# Patient Record
Sex: Female | Born: 1955 | Race: White | Hispanic: No | Marital: Married | Smoking: Current every day smoker
Health system: Southern US, Community
[De-identification: ages and names within clinical notes are randomized; demographics above are authoritative.]

## PROBLEM LIST (undated history)

## (undated) DIAGNOSIS — S060XAA Concussion with loss of consciousness status unknown, initial encounter: Secondary | ICD-10-CM

## (undated) DIAGNOSIS — I10 Essential (primary) hypertension: Secondary | ICD-10-CM

## (undated) DIAGNOSIS — R197 Diarrhea, unspecified: Secondary | ICD-10-CM

## (undated) DIAGNOSIS — M810 Age-related osteoporosis without current pathological fracture: Secondary | ICD-10-CM

## (undated) DIAGNOSIS — F99 Mental disorder, not otherwise specified: Secondary | ICD-10-CM

## (undated) DIAGNOSIS — K529 Noninfective gastroenteritis and colitis, unspecified: Secondary | ICD-10-CM

## (undated) DIAGNOSIS — K219 Gastro-esophageal reflux disease without esophagitis: Secondary | ICD-10-CM

## (undated) DIAGNOSIS — R748 Abnormal levels of other serum enzymes: Secondary | ICD-10-CM

## (undated) DIAGNOSIS — E785 Hyperlipidemia, unspecified: Secondary | ICD-10-CM

## (undated) DIAGNOSIS — G5600 Carpal tunnel syndrome, unspecified upper limb: Secondary | ICD-10-CM

## (undated) DIAGNOSIS — M1990 Unspecified osteoarthritis, unspecified site: Secondary | ICD-10-CM

## (undated) DIAGNOSIS — S060X9A Concussion with loss of consciousness of unspecified duration, initial encounter: Secondary | ICD-10-CM

## (undated) DIAGNOSIS — J4489 Other specified chronic obstructive pulmonary disease: Secondary | ICD-10-CM

## (undated) DIAGNOSIS — R112 Nausea with vomiting, unspecified: Secondary | ICD-10-CM

## (undated) DIAGNOSIS — F419 Anxiety disorder, unspecified: Secondary | ICD-10-CM

## (undated) DIAGNOSIS — M25519 Pain in unspecified shoulder: Secondary | ICD-10-CM

## (undated) DIAGNOSIS — K649 Unspecified hemorrhoids: Secondary | ICD-10-CM

## (undated) DIAGNOSIS — R0602 Shortness of breath: Secondary | ICD-10-CM

## (undated) DIAGNOSIS — Z9889 Other specified postprocedural states: Secondary | ICD-10-CM

## (undated) DIAGNOSIS — G4730 Sleep apnea, unspecified: Secondary | ICD-10-CM

## (undated) DIAGNOSIS — IMO0002 Reserved for concepts with insufficient information to code with codable children: Secondary | ICD-10-CM

## (undated) DIAGNOSIS — M545 Low back pain, unspecified: Secondary | ICD-10-CM

## (undated) DIAGNOSIS — K222 Esophageal obstruction: Secondary | ICD-10-CM

## (undated) DIAGNOSIS — K289 Gastrojejunal ulcer, unspecified as acute or chronic, without hemorrhage or perforation: Secondary | ICD-10-CM

## (undated) DIAGNOSIS — K5090 Crohn's disease, unspecified, without complications: Secondary | ICD-10-CM

## (undated) DIAGNOSIS — J449 Chronic obstructive pulmonary disease, unspecified: Secondary | ICD-10-CM

## (undated) HISTORY — DX: Age-related osteoporosis without current pathological fracture: M81.0

## (undated) HISTORY — DX: Low back pain, unspecified: M54.50

## (undated) HISTORY — DX: Other specified chronic obstructive pulmonary disease: J44.89

## (undated) HISTORY — DX: Carpal tunnel syndrome, unspecified upper limb: G56.00

## (undated) HISTORY — PX: LUMBAR DISC SURGERY: SHX700

## (undated) HISTORY — PX: COLON SURGERY: SHX602

## (undated) HISTORY — DX: Chronic obstructive pulmonary disease, unspecified: J44.9

## (undated) HISTORY — DX: Anxiety disorder, unspecified: F41.9

## (undated) HISTORY — DX: Esophageal obstruction: K22.2

## (undated) HISTORY — DX: Mental disorder, not otherwise specified: F99

## (undated) HISTORY — DX: Diarrhea, unspecified: R19.7

## (undated) HISTORY — DX: Noninfective gastroenteritis and colitis, unspecified: K52.9

## (undated) HISTORY — DX: Hyperlipidemia, unspecified: E78.5

## (undated) HISTORY — DX: Pain in unspecified shoulder: M25.519

## (undated) HISTORY — PX: BILATERAL SALPINGOOPHORECTOMY: SHX1223

## (undated) HISTORY — DX: Abnormal levels of other serum enzymes: R74.8

## (undated) HISTORY — PX: HERNIA REPAIR: SHX51

## (undated) HISTORY — DX: Reserved for concepts with insufficient information to code with codable children: IMO0002

## (undated) HISTORY — DX: Unspecified hemorrhoids: K64.9

## (undated) HISTORY — DX: Crohn's disease, unspecified, without complications: K50.90

## (undated) HISTORY — PX: CARPAL TUNNEL RELEASE: SHX101

## (undated) HISTORY — DX: Low back pain: M54.5

## (undated) HISTORY — DX: Gastrojejunal ulcer, unspecified as acute or chronic, without hemorrhage or perforation: K28.9

## (undated) HISTORY — PX: COLONOSCOPY: SHX174

## (undated) HISTORY — PX: OTHER SURGICAL HISTORY: SHX169

## (undated) HISTORY — PX: UPPER GASTROINTESTINAL ENDOSCOPY: SHX188

## (undated) HISTORY — DX: Concussion with loss of consciousness status unknown, initial encounter: S06.0XAA

## (undated) HISTORY — DX: Concussion with loss of consciousness of unspecified duration, initial encounter: S06.0X9A

## (undated) HISTORY — PX: HEMICOLECTOMY: SHX854

## (undated) HISTORY — PX: EYE SURGERY: SHX253

## (undated) HISTORY — PX: DILATION AND CURETTAGE OF UTERUS: SHX78

## (undated) HISTORY — PX: BACK SURGERY: SHX140

---

## 2004-01-23 ENCOUNTER — Ambulatory Visit (HOSPITAL_COMMUNITY): Admission: RE | Admit: 2004-01-23 | Discharge: 2004-01-23 | Payer: Self-pay | Admitting: Emergency Medicine

## 2004-06-22 ENCOUNTER — Ambulatory Visit (HOSPITAL_COMMUNITY): Admission: RE | Admit: 2004-06-22 | Discharge: 2004-06-22 | Payer: Self-pay | Admitting: Emergency Medicine

## 2004-07-19 ENCOUNTER — Ambulatory Visit (HOSPITAL_COMMUNITY): Admission: RE | Admit: 2004-07-19 | Discharge: 2004-07-19 | Payer: Self-pay | Admitting: Emergency Medicine

## 2004-07-21 ENCOUNTER — Inpatient Hospital Stay (HOSPITAL_COMMUNITY): Admission: EM | Admit: 2004-07-21 | Discharge: 2004-08-02 | Payer: Self-pay | Admitting: Emergency Medicine

## 2004-09-05 ENCOUNTER — Ambulatory Visit (HOSPITAL_COMMUNITY): Admission: RE | Admit: 2004-09-05 | Discharge: 2004-09-05 | Payer: Self-pay | Admitting: General Surgery

## 2005-05-21 ENCOUNTER — Inpatient Hospital Stay (HOSPITAL_COMMUNITY): Admission: RE | Admit: 2005-05-21 | Discharge: 2005-05-23 | Payer: Self-pay | Admitting: General Surgery

## 2006-01-10 ENCOUNTER — Ambulatory Visit: Payer: Self-pay | Admitting: Family Medicine

## 2006-01-16 ENCOUNTER — Ambulatory Visit (HOSPITAL_COMMUNITY): Admission: RE | Admit: 2006-01-16 | Discharge: 2006-01-16 | Payer: Self-pay | Admitting: Family Medicine

## 2006-01-16 ENCOUNTER — Encounter (INDEPENDENT_AMBULATORY_CARE_PROVIDER_SITE_OTHER): Payer: Self-pay | Admitting: Family Medicine

## 2006-01-16 LAB — CONVERTED CEMR LAB: TSH: 1.045 microintl units/mL

## 2006-01-20 ENCOUNTER — Ambulatory Visit: Payer: Self-pay | Admitting: Family Medicine

## 2006-01-31 ENCOUNTER — Ambulatory Visit: Payer: Self-pay | Admitting: Family Medicine

## 2006-02-03 ENCOUNTER — Ambulatory Visit: Payer: Self-pay | Admitting: Family Medicine

## 2006-02-12 ENCOUNTER — Ambulatory Visit: Payer: Self-pay | Admitting: Urgent Care

## 2006-02-17 ENCOUNTER — Ambulatory Visit (HOSPITAL_COMMUNITY): Admission: RE | Admit: 2006-02-17 | Discharge: 2006-02-17 | Payer: Self-pay | Admitting: Gastroenterology

## 2006-02-18 ENCOUNTER — Ambulatory Visit: Payer: Self-pay | Admitting: Family Medicine

## 2006-02-24 ENCOUNTER — Ambulatory Visit (HOSPITAL_COMMUNITY): Admission: RE | Admit: 2006-02-24 | Discharge: 2006-02-24 | Payer: Self-pay | Admitting: Family Medicine

## 2006-02-27 ENCOUNTER — Encounter (INDEPENDENT_AMBULATORY_CARE_PROVIDER_SITE_OTHER): Payer: Self-pay | Admitting: Family Medicine

## 2006-02-27 DIAGNOSIS — J309 Allergic rhinitis, unspecified: Secondary | ICD-10-CM | POA: Insufficient documentation

## 2006-02-27 DIAGNOSIS — K219 Gastro-esophageal reflux disease without esophagitis: Secondary | ICD-10-CM | POA: Insufficient documentation

## 2006-02-27 DIAGNOSIS — K50019 Crohn's disease of small intestine with unspecified complications: Secondary | ICD-10-CM

## 2006-02-27 DIAGNOSIS — I10 Essential (primary) hypertension: Secondary | ICD-10-CM | POA: Insufficient documentation

## 2006-02-27 DIAGNOSIS — F411 Generalized anxiety disorder: Secondary | ICD-10-CM

## 2006-03-07 ENCOUNTER — Ambulatory Visit: Payer: Self-pay | Admitting: Gastroenterology

## 2006-04-10 ENCOUNTER — Ambulatory Visit: Payer: Self-pay | Admitting: Family Medicine

## 2006-04-24 ENCOUNTER — Ambulatory Visit: Payer: Self-pay | Admitting: Family Medicine

## 2006-05-26 ENCOUNTER — Ambulatory Visit: Payer: Self-pay | Admitting: Family Medicine

## 2006-06-24 ENCOUNTER — Ambulatory Visit: Payer: Self-pay | Admitting: Family Medicine

## 2006-07-14 ENCOUNTER — Encounter (INDEPENDENT_AMBULATORY_CARE_PROVIDER_SITE_OTHER): Payer: Self-pay | Admitting: Family Medicine

## 2006-07-14 ENCOUNTER — Ambulatory Visit: Payer: Self-pay | Admitting: Gastroenterology

## 2006-07-21 ENCOUNTER — Telehealth (INDEPENDENT_AMBULATORY_CARE_PROVIDER_SITE_OTHER): Payer: Self-pay | Admitting: Family Medicine

## 2006-07-23 ENCOUNTER — Encounter (INDEPENDENT_AMBULATORY_CARE_PROVIDER_SITE_OTHER): Payer: Self-pay | Admitting: Family Medicine

## 2006-08-13 ENCOUNTER — Encounter (INDEPENDENT_AMBULATORY_CARE_PROVIDER_SITE_OTHER): Payer: Self-pay | Admitting: Family Medicine

## 2006-08-22 ENCOUNTER — Telehealth (INDEPENDENT_AMBULATORY_CARE_PROVIDER_SITE_OTHER): Payer: Self-pay | Admitting: Family Medicine

## 2006-08-29 ENCOUNTER — Encounter (INDEPENDENT_AMBULATORY_CARE_PROVIDER_SITE_OTHER): Payer: Self-pay | Admitting: Family Medicine

## 2006-09-01 ENCOUNTER — Other Ambulatory Visit: Admission: RE | Admit: 2006-09-01 | Discharge: 2006-09-01 | Payer: Self-pay | Admitting: Family Medicine

## 2006-09-01 ENCOUNTER — Encounter (INDEPENDENT_AMBULATORY_CARE_PROVIDER_SITE_OTHER): Payer: Self-pay | Admitting: Family Medicine

## 2006-09-01 ENCOUNTER — Ambulatory Visit: Payer: Self-pay | Admitting: Family Medicine

## 2006-09-01 LAB — CONVERTED CEMR LAB: Pap Smear: NORMAL

## 2006-09-04 ENCOUNTER — Telehealth (INDEPENDENT_AMBULATORY_CARE_PROVIDER_SITE_OTHER): Payer: Self-pay | Admitting: Family Medicine

## 2006-09-09 ENCOUNTER — Ambulatory Visit: Payer: Self-pay | Admitting: Family Medicine

## 2006-09-22 ENCOUNTER — Encounter (INDEPENDENT_AMBULATORY_CARE_PROVIDER_SITE_OTHER): Payer: Self-pay | Admitting: Family Medicine

## 2006-09-25 ENCOUNTER — Ambulatory Visit (HOSPITAL_COMMUNITY): Admission: RE | Admit: 2006-09-25 | Discharge: 2006-09-25 | Payer: Self-pay | Admitting: Family Medicine

## 2006-09-30 ENCOUNTER — Ambulatory Visit: Payer: Self-pay | Admitting: Family Medicine

## 2006-09-30 DIAGNOSIS — I868 Varicose veins of other specified sites: Secondary | ICD-10-CM

## 2006-10-02 ENCOUNTER — Encounter: Admission: RE | Admit: 2006-10-02 | Discharge: 2006-10-02 | Payer: Self-pay | Admitting: Family Medicine

## 2006-11-06 ENCOUNTER — Encounter (INDEPENDENT_AMBULATORY_CARE_PROVIDER_SITE_OTHER): Payer: Self-pay | Admitting: Family Medicine

## 2006-11-12 ENCOUNTER — Encounter (INDEPENDENT_AMBULATORY_CARE_PROVIDER_SITE_OTHER): Payer: Self-pay | Admitting: Family Medicine

## 2006-12-02 ENCOUNTER — Ambulatory Visit: Payer: Self-pay | Admitting: Family Medicine

## 2006-12-04 ENCOUNTER — Encounter (INDEPENDENT_AMBULATORY_CARE_PROVIDER_SITE_OTHER): Payer: Self-pay | Admitting: Family Medicine

## 2007-01-12 ENCOUNTER — Encounter (INDEPENDENT_AMBULATORY_CARE_PROVIDER_SITE_OTHER): Payer: Self-pay | Admitting: Family Medicine

## 2007-01-29 ENCOUNTER — Encounter (INDEPENDENT_AMBULATORY_CARE_PROVIDER_SITE_OTHER): Payer: Self-pay | Admitting: Family Medicine

## 2007-01-29 ENCOUNTER — Ambulatory Visit: Payer: Self-pay | Admitting: Gastroenterology

## 2007-02-03 ENCOUNTER — Ambulatory Visit: Payer: Self-pay | Admitting: Family Medicine

## 2007-02-03 DIAGNOSIS — E785 Hyperlipidemia, unspecified: Secondary | ICD-10-CM | POA: Insufficient documentation

## 2007-02-03 LAB — CONVERTED CEMR LAB
HDL goal, serum: 40 mg/dL
LDL Goal: 130 mg/dL

## 2007-02-04 LAB — CONVERTED CEMR LAB
Barbiturate Quant, Ur: NEGATIVE
Benzodiazepines.: POSITIVE — AB
Cocaine Metabolites: NEGATIVE
Creatinine,U: 62.9 mg/dL
Methadone: NEGATIVE

## 2007-02-06 ENCOUNTER — Encounter (INDEPENDENT_AMBULATORY_CARE_PROVIDER_SITE_OTHER): Payer: Self-pay | Admitting: Family Medicine

## 2007-02-06 ENCOUNTER — Ambulatory Visit (HOSPITAL_COMMUNITY): Admission: RE | Admit: 2007-02-06 | Discharge: 2007-02-06 | Payer: Self-pay | Admitting: Family Medicine

## 2007-02-09 LAB — CONVERTED CEMR LAB
Albumin: 4.4 g/dL (ref 3.5–5.2)
CO2: 23 meq/L (ref 19–32)
Calcium: 9.3 mg/dL (ref 8.4–10.5)
Chloride: 102 meq/L (ref 96–112)
Cholesterol: 222 mg/dL — ABNORMAL HIGH (ref 0–200)
Glucose, Bld: 78 mg/dL (ref 70–99)
LDL Cholesterol: 57 mg/dL (ref 0–99)
Lymphocytes Relative: 36 % (ref 12–46)
Lymphs Abs: 1.6 10*3/uL (ref 0.7–3.3)
Monocytes Relative: 13 % — ABNORMAL HIGH (ref 3–11)
Neutrophils Relative %: 49 % (ref 43–77)
Platelets: 278 10*3/uL (ref 150–400)
Potassium: 4 meq/L (ref 3.5–5.3)
RBC: 4.49 M/uL (ref 3.87–5.11)
Sodium: 139 meq/L (ref 135–145)
Total Protein: 6.7 g/dL (ref 6.0–8.3)
Triglycerides: 85 mg/dL (ref <150)
WBC: 4.6 10*3/uL (ref 4.0–10.5)

## 2007-03-03 ENCOUNTER — Encounter (INDEPENDENT_AMBULATORY_CARE_PROVIDER_SITE_OTHER): Payer: Self-pay | Admitting: Family Medicine

## 2007-03-06 ENCOUNTER — Ambulatory Visit: Payer: Self-pay | Admitting: Gastroenterology

## 2007-03-06 ENCOUNTER — Ambulatory Visit (HOSPITAL_COMMUNITY): Admission: RE | Admit: 2007-03-06 | Discharge: 2007-03-06 | Payer: Self-pay | Admitting: Gastroenterology

## 2007-03-10 ENCOUNTER — Encounter (INDEPENDENT_AMBULATORY_CARE_PROVIDER_SITE_OTHER): Payer: Self-pay | Admitting: Family Medicine

## 2007-04-28 ENCOUNTER — Encounter (INDEPENDENT_AMBULATORY_CARE_PROVIDER_SITE_OTHER): Payer: Self-pay | Admitting: Family Medicine

## 2007-05-04 ENCOUNTER — Ambulatory Visit: Payer: Self-pay | Admitting: Family Medicine

## 2007-05-07 DIAGNOSIS — K289 Gastrojejunal ulcer, unspecified as acute or chronic, without hemorrhage or perforation: Secondary | ICD-10-CM

## 2007-05-07 DIAGNOSIS — K222 Esophageal obstruction: Secondary | ICD-10-CM

## 2007-05-07 HISTORY — DX: Gastrojejunal ulcer, unspecified as acute or chronic, without hemorrhage or perforation: K28.9

## 2007-05-07 HISTORY — DX: Esophageal obstruction: K22.2

## 2007-05-12 ENCOUNTER — Ambulatory Visit: Payer: Self-pay | Admitting: Gastroenterology

## 2007-05-15 ENCOUNTER — Ambulatory Visit: Payer: Self-pay | Admitting: Gastroenterology

## 2007-05-15 ENCOUNTER — Ambulatory Visit (HOSPITAL_COMMUNITY): Admission: RE | Admit: 2007-05-15 | Discharge: 2007-05-15 | Payer: Self-pay | Admitting: Gastroenterology

## 2007-05-15 ENCOUNTER — Encounter: Payer: Self-pay | Admitting: Gastroenterology

## 2007-05-28 ENCOUNTER — Encounter (INDEPENDENT_AMBULATORY_CARE_PROVIDER_SITE_OTHER): Payer: Self-pay | Admitting: Family Medicine

## 2007-06-09 ENCOUNTER — Ambulatory Visit: Payer: Self-pay | Admitting: Family Medicine

## 2007-06-24 ENCOUNTER — Ambulatory Visit: Payer: Self-pay | Admitting: Gastroenterology

## 2007-06-24 ENCOUNTER — Encounter (INDEPENDENT_AMBULATORY_CARE_PROVIDER_SITE_OTHER): Payer: Self-pay | Admitting: Family Medicine

## 2007-06-29 ENCOUNTER — Ambulatory Visit: Payer: Self-pay | Admitting: Family Medicine

## 2007-06-29 DIAGNOSIS — M5030 Other cervical disc degeneration, unspecified cervical region: Secondary | ICD-10-CM | POA: Insufficient documentation

## 2007-07-08 ENCOUNTER — Ambulatory Visit: Payer: Self-pay | Admitting: Family Medicine

## 2007-07-08 ENCOUNTER — Telehealth (INDEPENDENT_AMBULATORY_CARE_PROVIDER_SITE_OTHER): Payer: Self-pay | Admitting: *Deleted

## 2007-07-09 ENCOUNTER — Ambulatory Visit (HOSPITAL_COMMUNITY): Admission: RE | Admit: 2007-07-09 | Discharge: 2007-07-09 | Payer: Self-pay | Admitting: Family Medicine

## 2007-07-13 ENCOUNTER — Telehealth (INDEPENDENT_AMBULATORY_CARE_PROVIDER_SITE_OTHER): Payer: Self-pay | Admitting: *Deleted

## 2007-07-23 ENCOUNTER — Encounter (INDEPENDENT_AMBULATORY_CARE_PROVIDER_SITE_OTHER): Payer: Self-pay | Admitting: Family Medicine

## 2007-08-05 ENCOUNTER — Ambulatory Visit: Payer: Self-pay | Admitting: Family Medicine

## 2007-08-05 ENCOUNTER — Telehealth (INDEPENDENT_AMBULATORY_CARE_PROVIDER_SITE_OTHER): Payer: Self-pay | Admitting: *Deleted

## 2007-08-18 ENCOUNTER — Encounter (INDEPENDENT_AMBULATORY_CARE_PROVIDER_SITE_OTHER): Payer: Self-pay | Admitting: Family Medicine

## 2007-08-18 ENCOUNTER — Encounter (HOSPITAL_COMMUNITY): Admission: RE | Admit: 2007-08-18 | Discharge: 2007-09-17 | Payer: Self-pay | Admitting: Family Medicine

## 2007-08-24 ENCOUNTER — Telehealth (INDEPENDENT_AMBULATORY_CARE_PROVIDER_SITE_OTHER): Payer: Self-pay | Admitting: *Deleted

## 2007-08-24 ENCOUNTER — Ambulatory Visit: Payer: Self-pay | Admitting: Family Medicine

## 2007-09-05 ENCOUNTER — Encounter (INDEPENDENT_AMBULATORY_CARE_PROVIDER_SITE_OTHER): Payer: Self-pay | Admitting: Family Medicine

## 2007-09-10 ENCOUNTER — Ambulatory Visit: Payer: Self-pay | Admitting: Gastroenterology

## 2007-09-10 ENCOUNTER — Encounter (INDEPENDENT_AMBULATORY_CARE_PROVIDER_SITE_OTHER): Payer: Self-pay | Admitting: Family Medicine

## 2007-09-21 ENCOUNTER — Encounter (INDEPENDENT_AMBULATORY_CARE_PROVIDER_SITE_OTHER): Payer: Self-pay | Admitting: Family Medicine

## 2007-09-22 ENCOUNTER — Encounter (HOSPITAL_COMMUNITY): Admission: RE | Admit: 2007-09-22 | Discharge: 2007-10-22 | Payer: Self-pay | Admitting: Family Medicine

## 2007-09-29 ENCOUNTER — Ambulatory Visit: Payer: Self-pay | Admitting: Family Medicine

## 2007-09-29 DIAGNOSIS — B009 Herpesviral infection, unspecified: Secondary | ICD-10-CM

## 2007-09-30 ENCOUNTER — Encounter (INDEPENDENT_AMBULATORY_CARE_PROVIDER_SITE_OTHER): Payer: Self-pay | Admitting: Family Medicine

## 2007-10-06 ENCOUNTER — Ambulatory Visit (HOSPITAL_COMMUNITY): Admission: RE | Admit: 2007-10-06 | Discharge: 2007-10-06 | Payer: Self-pay | Admitting: Family Medicine

## 2007-10-13 ENCOUNTER — Ambulatory Visit: Payer: Self-pay | Admitting: Family Medicine

## 2007-10-14 ENCOUNTER — Encounter (INDEPENDENT_AMBULATORY_CARE_PROVIDER_SITE_OTHER): Payer: Self-pay | Admitting: Family Medicine

## 2007-10-14 LAB — CONVERTED CEMR LAB
Albumin: 4.7 g/dL (ref 3.5–5.2)
Alkaline Phosphatase: 105 units/L (ref 39–117)
Chloride: 101 meq/L (ref 96–112)
Glucose, Bld: 86 mg/dL (ref 70–99)
Potassium: 3.9 meq/L (ref 3.5–5.3)
Sodium: 139 meq/L (ref 135–145)
Total Protein: 7.3 g/dL (ref 6.0–8.3)

## 2007-10-27 ENCOUNTER — Encounter (HOSPITAL_COMMUNITY): Admission: RE | Admit: 2007-10-27 | Discharge: 2007-11-26 | Payer: Self-pay | Admitting: Family Medicine

## 2007-11-05 ENCOUNTER — Encounter (INDEPENDENT_AMBULATORY_CARE_PROVIDER_SITE_OTHER): Payer: Self-pay | Admitting: Family Medicine

## 2007-11-17 ENCOUNTER — Ambulatory Visit: Payer: Self-pay | Admitting: Family Medicine

## 2007-11-17 ENCOUNTER — Other Ambulatory Visit: Admission: RE | Admit: 2007-11-17 | Discharge: 2007-11-17 | Payer: Self-pay | Admitting: Family Medicine

## 2007-11-17 ENCOUNTER — Encounter (INDEPENDENT_AMBULATORY_CARE_PROVIDER_SITE_OTHER): Payer: Self-pay | Admitting: Family Medicine

## 2007-11-26 LAB — CONVERTED CEMR LAB: Pap Smear: NORMAL

## 2007-12-16 ENCOUNTER — Ambulatory Visit: Payer: Self-pay | Admitting: Gastroenterology

## 2007-12-20 ENCOUNTER — Encounter (INDEPENDENT_AMBULATORY_CARE_PROVIDER_SITE_OTHER): Payer: Self-pay | Admitting: Family Medicine

## 2008-02-18 ENCOUNTER — Ambulatory Visit: Payer: Self-pay | Admitting: Family Medicine

## 2008-03-16 ENCOUNTER — Ambulatory Visit: Payer: Self-pay | Admitting: Family Medicine

## 2008-03-16 ENCOUNTER — Ambulatory Visit (HOSPITAL_COMMUNITY): Admission: RE | Admit: 2008-03-16 | Discharge: 2008-03-16 | Payer: Self-pay | Admitting: Family Medicine

## 2008-03-16 LAB — CONVERTED CEMR LAB
ALT: 51 units/L — ABNORMAL HIGH (ref 0–35)
Alkaline Phosphatase: 114 units/L (ref 39–117)
Basophils Absolute: 0 10*3/uL (ref 0.0–0.1)
Eosinophils Absolute: 0 10*3/uL (ref 0.0–0.7)
Eosinophils Relative: 0 % (ref 0–5)
Glucose, Urine, Semiquant: NEGATIVE
HCT: 45.6 % (ref 36.0–46.0)
MCV: 99.5 fL (ref 78.0–100.0)
Nitrite: NEGATIVE
Platelets: 263 10*3/uL (ref 150–400)
RDW: 13 % (ref 11.5–15.5)
Sodium: 133 meq/L — ABNORMAL LOW (ref 135–145)
Specific Gravity, Urine: 1.015
Total Bilirubin: 1.2 mg/dL (ref 0.3–1.2)
Total Protein: 6.9 g/dL (ref 6.0–8.3)
pH: 6

## 2008-03-17 ENCOUNTER — Encounter (INDEPENDENT_AMBULATORY_CARE_PROVIDER_SITE_OTHER): Payer: Self-pay | Admitting: Family Medicine

## 2008-03-18 ENCOUNTER — Ambulatory Visit: Payer: Self-pay | Admitting: Family Medicine

## 2008-03-18 DIAGNOSIS — R740 Nonspecific elevation of levels of transaminase and lactic acid dehydrogenase [LDH]: Secondary | ICD-10-CM

## 2008-03-19 LAB — CONVERTED CEMR LAB
ALT: 60 units/L — ABNORMAL HIGH (ref 0–35)
BUN: 10 mg/dL (ref 6–23)
CO2: 23 meq/L (ref 19–32)
Calcium: 9.5 mg/dL (ref 8.4–10.5)
Chloride: 101 meq/L (ref 96–112)
Creatinine, Ser: 0.62 mg/dL (ref 0.40–1.20)

## 2008-03-21 ENCOUNTER — Encounter (INDEPENDENT_AMBULATORY_CARE_PROVIDER_SITE_OTHER): Payer: Self-pay | Admitting: Family Medicine

## 2008-03-22 ENCOUNTER — Ambulatory Visit: Payer: Self-pay | Admitting: Family Medicine

## 2008-03-28 ENCOUNTER — Telehealth (INDEPENDENT_AMBULATORY_CARE_PROVIDER_SITE_OTHER): Payer: Self-pay | Admitting: *Deleted

## 2008-03-29 ENCOUNTER — Ambulatory Visit: Payer: Self-pay | Admitting: Gastroenterology

## 2008-04-04 ENCOUNTER — Ambulatory Visit: Payer: Self-pay | Admitting: Family Medicine

## 2008-04-05 ENCOUNTER — Encounter (INDEPENDENT_AMBULATORY_CARE_PROVIDER_SITE_OTHER): Payer: Self-pay | Admitting: Family Medicine

## 2008-04-05 LAB — CONVERTED CEMR LAB
ALT: 94 units/L — ABNORMAL HIGH (ref 0–35)
AST: 51 units/L — ABNORMAL HIGH (ref 0–37)
CO2: 20 meq/L (ref 19–32)
Sodium: 135 meq/L (ref 135–145)
Total Bilirubin: 0.9 mg/dL (ref 0.3–1.2)
Total Protein: 6.9 g/dL (ref 6.0–8.3)

## 2008-04-07 ENCOUNTER — Encounter: Payer: Self-pay | Admitting: Gastroenterology

## 2008-04-07 LAB — CONVERTED CEMR LAB: IgG (Immunoglobin G), Serum: 546 mg/dL — ABNORMAL LOW (ref 694–1618)

## 2008-05-18 ENCOUNTER — Ambulatory Visit: Payer: Self-pay | Admitting: Family Medicine

## 2008-05-20 LAB — CONVERTED CEMR LAB
ALT: 39 units/L — ABNORMAL HIGH (ref 0–35)
AST: 32 units/L (ref 0–37)
Albumin: 4.5 g/dL (ref 3.5–5.2)
Alkaline Phosphatase: 106 units/L (ref 39–117)
BUN: 13 mg/dL (ref 6–23)
CO2: 25 meq/L (ref 19–32)
Calcium: 9.3 mg/dL (ref 8.4–10.5)
Chloride: 101 meq/L (ref 96–112)
Creatinine, Ser: 0.71 mg/dL (ref 0.40–1.20)
Glucose, Bld: 74 mg/dL (ref 70–99)
Potassium: 3.8 meq/L (ref 3.5–5.3)
Sodium: 142 meq/L (ref 135–145)
Total Bilirubin: 0.4 mg/dL (ref 0.3–1.2)
Total Protein: 6.6 g/dL (ref 6.0–8.3)

## 2008-07-01 ENCOUNTER — Ambulatory Visit: Payer: Self-pay | Admitting: Family Medicine

## 2008-07-02 ENCOUNTER — Encounter (INDEPENDENT_AMBULATORY_CARE_PROVIDER_SITE_OTHER): Payer: Self-pay | Admitting: Family Medicine

## 2008-07-04 ENCOUNTER — Encounter: Payer: Self-pay | Admitting: Gastroenterology

## 2008-07-04 ENCOUNTER — Encounter (INDEPENDENT_AMBULATORY_CARE_PROVIDER_SITE_OTHER): Payer: Self-pay | Admitting: Family Medicine

## 2008-07-04 LAB — CONVERTED CEMR LAB
Benzodiazepines.: POSITIVE — AB
Creatinine,U: 75.6 mg/dL
Methadone: NEGATIVE
Phencyclidine (PCP): NEGATIVE
Propoxyphene: NEGATIVE

## 2008-07-06 LAB — CONVERTED CEMR LAB
Bilirubin, Direct: 0.1 mg/dL (ref 0.0–0.3)
Indirect Bilirubin: 0.3 mg/dL (ref 0.0–0.9)
Total Bilirubin: 0.4 mg/dL (ref 0.3–1.2)

## 2008-07-08 ENCOUNTER — Encounter: Payer: Self-pay | Admitting: Gastroenterology

## 2008-07-08 ENCOUNTER — Ambulatory Visit: Payer: Self-pay | Admitting: Gastroenterology

## 2008-07-19 LAB — CONVERTED CEMR LAB
Hep A Total Ab: NEGATIVE
Hepatitis B Surface Ag: NEGATIVE
Iron: 149 ug/dL — ABNORMAL HIGH (ref 42–145)
TIBC: 392 ug/dL (ref 250–470)
UIBC: 243 ug/dL

## 2008-07-25 ENCOUNTER — Ambulatory Visit (HOSPITAL_COMMUNITY): Admission: RE | Admit: 2008-07-25 | Discharge: 2008-07-25 | Payer: Self-pay | Admitting: Gastroenterology

## 2008-07-25 ENCOUNTER — Telehealth (INDEPENDENT_AMBULATORY_CARE_PROVIDER_SITE_OTHER): Payer: Self-pay

## 2008-07-25 ENCOUNTER — Encounter: Payer: Self-pay | Admitting: Gastroenterology

## 2008-07-25 LAB — CONVERTED CEMR LAB
Basophils Relative: 0 % (ref 0–1)
Creatinine, Ser: 0.56 mg/dL (ref 0.40–1.20)
Eosinophils Absolute: 0 10*3/uL (ref 0.0–0.7)
Hemoglobin: 15.5 g/dL — ABNORMAL HIGH (ref 12.0–15.0)
Lymphs Abs: 1.2 10*3/uL (ref 0.7–4.0)
MCHC: 35.1 g/dL (ref 30.0–36.0)
MCV: 100.9 fL — ABNORMAL HIGH (ref 78.0–100.0)
Monocytes Absolute: 0.5 10*3/uL (ref 0.1–1.0)
Monocytes Relative: 9 % (ref 3–12)
Neutro Abs: 4 10*3/uL (ref 1.7–7.7)
Neutrophils Relative %: 70 % (ref 43–77)
RBC: 4.38 M/uL (ref 3.87–5.11)
WBC: 5.7 10*3/uL (ref 4.0–10.5)

## 2008-07-26 ENCOUNTER — Encounter (INDEPENDENT_AMBULATORY_CARE_PROVIDER_SITE_OTHER): Payer: Self-pay

## 2008-07-26 ENCOUNTER — Encounter: Payer: Self-pay | Admitting: Gastroenterology

## 2008-07-26 ENCOUNTER — Telehealth (INDEPENDENT_AMBULATORY_CARE_PROVIDER_SITE_OTHER): Payer: Self-pay | Admitting: *Deleted

## 2008-07-26 DIAGNOSIS — D539 Nutritional anemia, unspecified: Secondary | ICD-10-CM | POA: Insufficient documentation

## 2008-07-28 ENCOUNTER — Ambulatory Visit: Payer: Self-pay | Admitting: Family Medicine

## 2008-07-28 DIAGNOSIS — M4316 Spondylolisthesis, lumbar region: Secondary | ICD-10-CM | POA: Insufficient documentation

## 2008-07-28 DIAGNOSIS — IMO0002 Reserved for concepts with insufficient information to code with codable children: Secondary | ICD-10-CM

## 2008-08-01 ENCOUNTER — Ambulatory Visit: Payer: Self-pay | Admitting: Family Medicine

## 2008-08-04 ENCOUNTER — Encounter: Payer: Self-pay | Admitting: Gastroenterology

## 2008-08-05 ENCOUNTER — Encounter (INDEPENDENT_AMBULATORY_CARE_PROVIDER_SITE_OTHER): Payer: Self-pay | Admitting: Family Medicine

## 2008-08-05 ENCOUNTER — Ambulatory Visit (HOSPITAL_COMMUNITY): Admission: RE | Admit: 2008-08-05 | Discharge: 2008-08-05 | Payer: Self-pay | Admitting: Family Medicine

## 2008-08-09 ENCOUNTER — Encounter (INDEPENDENT_AMBULATORY_CARE_PROVIDER_SITE_OTHER): Payer: Self-pay | Admitting: Family Medicine

## 2008-08-10 ENCOUNTER — Telehealth: Payer: Self-pay | Admitting: Gastroenterology

## 2008-08-10 ENCOUNTER — Telehealth (INDEPENDENT_AMBULATORY_CARE_PROVIDER_SITE_OTHER): Payer: Self-pay | Admitting: Family Medicine

## 2008-08-15 ENCOUNTER — Telehealth (INDEPENDENT_AMBULATORY_CARE_PROVIDER_SITE_OTHER): Payer: Self-pay | Admitting: Family Medicine

## 2008-08-19 ENCOUNTER — Telehealth (INDEPENDENT_AMBULATORY_CARE_PROVIDER_SITE_OTHER): Payer: Self-pay | Admitting: *Deleted

## 2008-08-25 ENCOUNTER — Ambulatory Visit (HOSPITAL_COMMUNITY): Admission: RE | Admit: 2008-08-25 | Discharge: 2008-08-26 | Payer: Self-pay | Admitting: Neurosurgery

## 2008-08-26 ENCOUNTER — Telehealth (INDEPENDENT_AMBULATORY_CARE_PROVIDER_SITE_OTHER): Payer: Self-pay | Admitting: *Deleted

## 2008-09-12 ENCOUNTER — Telehealth (INDEPENDENT_AMBULATORY_CARE_PROVIDER_SITE_OTHER): Payer: Self-pay | Admitting: *Deleted

## 2008-09-20 ENCOUNTER — Ambulatory Visit (HOSPITAL_COMMUNITY): Admission: RE | Admit: 2008-09-20 | Discharge: 2008-09-20 | Payer: Self-pay | Admitting: Neurosurgery

## 2008-09-26 ENCOUNTER — Encounter: Payer: Self-pay | Admitting: Gastroenterology

## 2008-10-04 ENCOUNTER — Telehealth (INDEPENDENT_AMBULATORY_CARE_PROVIDER_SITE_OTHER): Payer: Self-pay | Admitting: Family Medicine

## 2008-10-12 ENCOUNTER — Encounter (INDEPENDENT_AMBULATORY_CARE_PROVIDER_SITE_OTHER): Payer: Self-pay | Admitting: Family Medicine

## 2008-10-17 ENCOUNTER — Ambulatory Visit (HOSPITAL_COMMUNITY): Admission: RE | Admit: 2008-10-17 | Discharge: 2008-10-17 | Payer: Self-pay | Admitting: Neurosurgery

## 2008-10-18 ENCOUNTER — Ambulatory Visit: Payer: Self-pay | Admitting: Family Medicine

## 2008-10-24 ENCOUNTER — Telehealth (INDEPENDENT_AMBULATORY_CARE_PROVIDER_SITE_OTHER): Payer: Self-pay

## 2008-10-31 ENCOUNTER — Telehealth (INDEPENDENT_AMBULATORY_CARE_PROVIDER_SITE_OTHER): Payer: Self-pay | Admitting: Family Medicine

## 2008-11-01 ENCOUNTER — Ambulatory Visit: Payer: Self-pay | Admitting: Family Medicine

## 2008-11-01 ENCOUNTER — Ambulatory Visit (HOSPITAL_COMMUNITY): Admission: RE | Admit: 2008-11-01 | Discharge: 2008-11-01 | Payer: Self-pay | Admitting: Family Medicine

## 2008-11-08 ENCOUNTER — Encounter (INDEPENDENT_AMBULATORY_CARE_PROVIDER_SITE_OTHER): Payer: Self-pay | Admitting: Family Medicine

## 2008-11-08 ENCOUNTER — Ambulatory Visit (HOSPITAL_COMMUNITY): Admission: RE | Admit: 2008-11-08 | Discharge: 2008-11-08 | Payer: Self-pay | Admitting: Family Medicine

## 2008-11-09 ENCOUNTER — Encounter (INDEPENDENT_AMBULATORY_CARE_PROVIDER_SITE_OTHER): Payer: Self-pay | Admitting: Family Medicine

## 2008-11-11 ENCOUNTER — Encounter: Payer: Self-pay | Admitting: Gastroenterology

## 2008-11-11 ENCOUNTER — Ambulatory Visit: Payer: Self-pay | Admitting: Family Medicine

## 2008-11-11 DIAGNOSIS — M810 Age-related osteoporosis without current pathological fracture: Secondary | ICD-10-CM

## 2008-11-22 ENCOUNTER — Encounter (INDEPENDENT_AMBULATORY_CARE_PROVIDER_SITE_OTHER): Payer: Self-pay | Admitting: Family Medicine

## 2008-11-25 ENCOUNTER — Ambulatory Visit: Payer: Self-pay | Admitting: Family Medicine

## 2008-11-28 ENCOUNTER — Encounter (INDEPENDENT_AMBULATORY_CARE_PROVIDER_SITE_OTHER): Payer: Self-pay

## 2008-12-05 ENCOUNTER — Telehealth (INDEPENDENT_AMBULATORY_CARE_PROVIDER_SITE_OTHER): Payer: Self-pay

## 2008-12-05 ENCOUNTER — Ambulatory Visit (HOSPITAL_COMMUNITY): Admission: RE | Admit: 2008-12-05 | Discharge: 2008-12-05 | Payer: Self-pay | Admitting: Family Medicine

## 2008-12-07 ENCOUNTER — Encounter: Payer: Self-pay | Admitting: Gastroenterology

## 2008-12-13 ENCOUNTER — Ambulatory Visit: Payer: Self-pay | Admitting: Family Medicine

## 2008-12-13 ENCOUNTER — Ambulatory Visit (HOSPITAL_COMMUNITY): Admission: RE | Admit: 2008-12-13 | Discharge: 2008-12-13 | Payer: Self-pay | Admitting: Family Medicine

## 2008-12-16 ENCOUNTER — Ambulatory Visit: Payer: Self-pay | Admitting: Family Medicine

## 2008-12-22 ENCOUNTER — Ambulatory Visit: Payer: Self-pay | Admitting: Family Medicine

## 2008-12-22 ENCOUNTER — Encounter: Payer: Self-pay | Admitting: Gastroenterology

## 2008-12-23 LAB — CONVERTED CEMR LAB
BUN: 8 mg/dL (ref 6–23)
Basophils Relative: 1 % (ref 0–1)
CO2: 24 meq/L (ref 19–32)
Calcium: 9.7 mg/dL (ref 8.4–10.5)
Chloride: 100 meq/L (ref 96–112)
Creatinine, Ser: 0.58 mg/dL (ref 0.40–1.20)
Eosinophils Absolute: 0.1 10*3/uL (ref 0.0–0.7)
Eosinophils Relative: 1 % (ref 0–5)
Glucose, Bld: 66 mg/dL — ABNORMAL LOW (ref 70–99)
HCT: 44.8 % (ref 36.0–46.0)
HDL: 191 mg/dL (ref >39)
Hemoglobin: 15.6 g/dL — ABNORMAL HIGH (ref 12.0–15.0)
LDL Cholesterol: 98 mg/dL (ref 0–99)
Lymphs Abs: 1.2 10*3/uL (ref 0.7–4.0)
MCHC: 34.8 g/dL (ref 30.0–36.0)
MCV: 101.4 fL — ABNORMAL HIGH (ref 78.0–100.0)
Monocytes Absolute: 0.5 10*3/uL (ref 0.1–1.0)
Monocytes Relative: 11 % (ref 3–12)
RBC: 4.42 M/uL (ref 3.87–5.11)
TSH: 2.702 microintl units/mL (ref 0.350–4.500)
WBC: 4.9 10*3/uL (ref 4.0–10.5)

## 2008-12-26 ENCOUNTER — Telehealth: Payer: Self-pay | Admitting: Gastroenterology

## 2008-12-29 ENCOUNTER — Ambulatory Visit: Payer: Self-pay | Admitting: Gastroenterology

## 2008-12-29 LAB — CONVERTED CEMR LAB
ALT: 38 units/L — ABNORMAL HIGH (ref 0–35)
Bilirubin, Direct: 0.2 mg/dL (ref 0.0–0.3)
Indirect Bilirubin: 0.4 mg/dL (ref 0.0–0.9)
Total Bilirubin: 0.6 mg/dL (ref 0.3–1.2)

## 2009-01-03 ENCOUNTER — Encounter: Payer: Self-pay | Admitting: Gastroenterology

## 2009-01-04 ENCOUNTER — Ambulatory Visit (HOSPITAL_COMMUNITY): Admission: RE | Admit: 2009-01-04 | Discharge: 2009-01-04 | Payer: Self-pay | Admitting: Gastroenterology

## 2009-01-06 ENCOUNTER — Encounter: Payer: Self-pay | Admitting: Gastroenterology

## 2009-01-11 ENCOUNTER — Telehealth (INDEPENDENT_AMBULATORY_CARE_PROVIDER_SITE_OTHER): Payer: Self-pay

## 2009-01-11 ENCOUNTER — Ambulatory Visit: Payer: Self-pay | Admitting: Family Medicine

## 2009-01-23 ENCOUNTER — Telehealth (INDEPENDENT_AMBULATORY_CARE_PROVIDER_SITE_OTHER): Payer: Self-pay | Admitting: *Deleted

## 2009-01-27 ENCOUNTER — Telehealth (INDEPENDENT_AMBULATORY_CARE_PROVIDER_SITE_OTHER): Payer: Self-pay | Admitting: *Deleted

## 2009-01-31 ENCOUNTER — Encounter (INDEPENDENT_AMBULATORY_CARE_PROVIDER_SITE_OTHER): Payer: Self-pay | Admitting: Family Medicine

## 2009-02-01 ENCOUNTER — Ambulatory Visit: Payer: Self-pay | Admitting: Gastroenterology

## 2009-02-02 ENCOUNTER — Encounter (INDEPENDENT_AMBULATORY_CARE_PROVIDER_SITE_OTHER): Payer: Self-pay | Admitting: Family Medicine

## 2009-02-03 ENCOUNTER — Encounter: Payer: Self-pay | Admitting: Gastroenterology

## 2009-02-03 ENCOUNTER — Telehealth (INDEPENDENT_AMBULATORY_CARE_PROVIDER_SITE_OTHER): Payer: Self-pay

## 2009-02-07 ENCOUNTER — Ambulatory Visit: Payer: Self-pay | Admitting: Gastroenterology

## 2009-02-07 ENCOUNTER — Ambulatory Visit (HOSPITAL_COMMUNITY): Admission: RE | Admit: 2009-02-07 | Discharge: 2009-02-07 | Payer: Self-pay | Admitting: Gastroenterology

## 2009-02-07 ENCOUNTER — Encounter: Payer: Self-pay | Admitting: Gastroenterology

## 2009-02-07 HISTORY — PX: ESOPHAGOGASTRODUODENOSCOPY: SHX1529

## 2009-02-08 ENCOUNTER — Encounter: Payer: Self-pay | Admitting: Gastroenterology

## 2009-02-14 ENCOUNTER — Ambulatory Visit: Payer: Self-pay | Admitting: Gastroenterology

## 2009-02-14 ENCOUNTER — Ambulatory Visit (HOSPITAL_COMMUNITY): Admission: RE | Admit: 2009-02-14 | Discharge: 2009-02-14 | Payer: Self-pay | Admitting: Gastroenterology

## 2009-02-14 ENCOUNTER — Encounter: Payer: Self-pay | Admitting: Gastroenterology

## 2009-02-17 ENCOUNTER — Encounter: Payer: Self-pay | Admitting: Gastroenterology

## 2009-03-15 ENCOUNTER — Encounter: Payer: Self-pay | Admitting: Gastroenterology

## 2009-03-23 ENCOUNTER — Telehealth (INDEPENDENT_AMBULATORY_CARE_PROVIDER_SITE_OTHER): Payer: Self-pay

## 2009-04-19 ENCOUNTER — Ambulatory Visit: Payer: Self-pay | Admitting: Gastroenterology

## 2009-05-02 ENCOUNTER — Encounter: Payer: Self-pay | Admitting: Gastroenterology

## 2009-05-17 ENCOUNTER — Encounter: Payer: Self-pay | Admitting: Gastroenterology

## 2009-05-17 LAB — CONVERTED CEMR LAB
Indirect Bilirubin: 0.5 mg/dL (ref 0.0–0.9)
Total Bilirubin: 0.6 mg/dL (ref 0.3–1.2)
Total Protein: 6.7 g/dL (ref 6.0–8.3)

## 2009-06-01 LAB — CONVERTED CEMR LAB: Ceruloplasmin: 22 mg/dL (ref 21–63)

## 2009-06-05 ENCOUNTER — Ambulatory Visit (HOSPITAL_COMMUNITY): Admission: RE | Admit: 2009-06-05 | Discharge: 2009-06-05 | Payer: Self-pay | Admitting: Gastroenterology

## 2009-06-05 ENCOUNTER — Encounter: Payer: Self-pay | Admitting: Gastroenterology

## 2009-06-07 ENCOUNTER — Encounter: Payer: Self-pay | Admitting: Gastroenterology

## 2009-06-12 ENCOUNTER — Ambulatory Visit: Payer: Self-pay | Admitting: Family Medicine

## 2009-06-14 ENCOUNTER — Telehealth: Payer: Self-pay | Admitting: Physician Assistant

## 2009-06-15 ENCOUNTER — Telehealth: Payer: Self-pay | Admitting: Family Medicine

## 2009-07-07 LAB — CONVERTED CEMR LAB: Vit D, 25-Hydroxy: 25 ng/mL — ABNORMAL LOW (ref 30–89)

## 2009-07-10 ENCOUNTER — Ambulatory Visit: Payer: Self-pay | Admitting: Family Medicine

## 2009-07-14 ENCOUNTER — Ambulatory Visit (HOSPITAL_COMMUNITY): Admission: RE | Admit: 2009-07-14 | Discharge: 2009-07-14 | Payer: Self-pay | Admitting: Family Medicine

## 2009-07-14 ENCOUNTER — Encounter: Payer: Self-pay | Admitting: Physician Assistant

## 2009-07-19 ENCOUNTER — Ambulatory Visit: Payer: Self-pay | Admitting: Gastroenterology

## 2009-07-21 ENCOUNTER — Telehealth: Payer: Self-pay | Admitting: Family Medicine

## 2009-07-21 ENCOUNTER — Ambulatory Visit: Payer: Self-pay | Admitting: Family Medicine

## 2009-07-21 DIAGNOSIS — J019 Acute sinusitis, unspecified: Secondary | ICD-10-CM

## 2009-07-27 ENCOUNTER — Telehealth: Payer: Self-pay | Admitting: Physician Assistant

## 2009-08-04 ENCOUNTER — Ambulatory Visit: Payer: Self-pay | Admitting: Family Medicine

## 2009-08-04 DIAGNOSIS — R0600 Dyspnea, unspecified: Secondary | ICD-10-CM

## 2009-09-21 ENCOUNTER — Telehealth (INDEPENDENT_AMBULATORY_CARE_PROVIDER_SITE_OTHER): Payer: Self-pay

## 2009-09-22 ENCOUNTER — Encounter (INDEPENDENT_AMBULATORY_CARE_PROVIDER_SITE_OTHER): Payer: Self-pay

## 2009-09-26 ENCOUNTER — Encounter: Payer: Self-pay | Admitting: Gastroenterology

## 2009-10-10 ENCOUNTER — Telehealth: Payer: Self-pay | Admitting: Physician Assistant

## 2009-10-19 LAB — CONVERTED CEMR LAB
Basophils Absolute: 0 10*3/uL (ref 0.0–0.1)
Hemoglobin: 15.1 g/dL — ABNORMAL HIGH (ref 12.0–15.0)
INR: 0.95 (ref <1.50)
Lymphocytes Relative: 32 % (ref 12–46)
Lymphs Abs: 1.9 10*3/uL (ref 0.7–4.0)
Monocytes Absolute: 0.6 10*3/uL (ref 0.1–1.0)
Neutro Abs: 3.4 10*3/uL (ref 1.7–7.7)
Platelets: 287 10*3/uL (ref 150–400)
Prothrombin Time: 12.6 s (ref 11.6–15.2)
RDW: 14.4 % (ref 11.5–15.5)
WBC: 6 10*3/uL (ref 4.0–10.5)

## 2009-10-20 ENCOUNTER — Encounter (INDEPENDENT_AMBULATORY_CARE_PROVIDER_SITE_OTHER): Payer: Self-pay

## 2009-11-13 LAB — CONVERTED CEMR LAB
Albumin: 4.4 g/dL (ref 3.5–5.2)
Total Protein: 7 g/dL (ref 6.0–8.3)

## 2009-11-14 ENCOUNTER — Encounter: Payer: Self-pay | Admitting: Physician Assistant

## 2009-11-14 ENCOUNTER — Ambulatory Visit: Payer: Self-pay | Admitting: Family Medicine

## 2009-11-15 ENCOUNTER — Encounter: Payer: Self-pay | Admitting: *Deleted

## 2009-11-15 ENCOUNTER — Encounter (INDEPENDENT_AMBULATORY_CARE_PROVIDER_SITE_OTHER): Payer: Self-pay

## 2009-11-15 ENCOUNTER — Telehealth: Payer: Self-pay | Admitting: Physician Assistant

## 2009-11-16 ENCOUNTER — Ambulatory Visit: Payer: Self-pay | Admitting: Cardiovascular Disease

## 2009-11-20 LAB — CONVERTED CEMR LAB
Albumin: 4.7 g/dL (ref 3.5–5.2)
BUN: 12 mg/dL (ref 6–23)
CO2: 23 meq/L (ref 19–32)
Calcium: 10.1 mg/dL (ref 8.4–10.5)
Chloride: 102 meq/L (ref 96–112)
Glucose, Bld: 86 mg/dL (ref 70–99)
HDL: 137 mg/dL (ref >39)
Hemoglobin: 14.6 g/dL (ref 12.0–15.0)
Potassium: 4.7 meq/L (ref 3.5–5.3)
RBC: 4.32 M/uL (ref 3.87–5.11)
TSH: 1.369 microintl units/mL (ref 0.350–4.500)
Triglycerides: 73 mg/dL (ref <150)

## 2009-11-21 ENCOUNTER — Ambulatory Visit: Payer: Self-pay | Admitting: Gastroenterology

## 2009-11-23 ENCOUNTER — Ambulatory Visit (HOSPITAL_COMMUNITY): Admission: RE | Admit: 2009-11-23 | Discharge: 2009-11-23 | Payer: Self-pay | Admitting: Family Medicine

## 2009-11-28 ENCOUNTER — Telehealth: Payer: Self-pay | Admitting: Physician Assistant

## 2009-11-28 ENCOUNTER — Telehealth (INDEPENDENT_AMBULATORY_CARE_PROVIDER_SITE_OTHER): Payer: Self-pay

## 2009-11-29 ENCOUNTER — Ambulatory Visit: Payer: Self-pay | Admitting: Cardiology

## 2009-11-29 ENCOUNTER — Encounter (HOSPITAL_COMMUNITY)
Admission: RE | Admit: 2009-11-29 | Discharge: 2009-12-29 | Payer: Self-pay | Source: Home / Self Care | Admitting: Cardiovascular Disease

## 2009-11-29 ENCOUNTER — Encounter: Payer: Self-pay | Admitting: Cardiovascular Disease

## 2009-12-06 ENCOUNTER — Encounter: Payer: Self-pay | Admitting: Cardiovascular Disease

## 2009-12-12 ENCOUNTER — Telehealth: Payer: Self-pay | Admitting: Physician Assistant

## 2009-12-12 ENCOUNTER — Telehealth (INDEPENDENT_AMBULATORY_CARE_PROVIDER_SITE_OTHER): Payer: Self-pay

## 2009-12-13 ENCOUNTER — Encounter: Payer: Self-pay | Admitting: Physician Assistant

## 2009-12-24 ENCOUNTER — Telehealth (INDEPENDENT_AMBULATORY_CARE_PROVIDER_SITE_OTHER): Payer: Self-pay | Admitting: *Deleted

## 2010-01-19 ENCOUNTER — Ambulatory Visit: Payer: Self-pay | Admitting: Family Medicine

## 2010-01-19 ENCOUNTER — Encounter: Payer: Self-pay | Admitting: Physician Assistant

## 2010-01-19 DIAGNOSIS — J42 Unspecified chronic bronchitis: Secondary | ICD-10-CM | POA: Insufficient documentation

## 2010-02-15 ENCOUNTER — Encounter: Payer: Self-pay | Admitting: Physician Assistant

## 2010-02-15 ENCOUNTER — Ambulatory Visit: Payer: Self-pay | Admitting: Family Medicine

## 2010-02-15 ENCOUNTER — Ambulatory Visit (HOSPITAL_COMMUNITY): Admission: RE | Admit: 2010-02-15 | Discharge: 2010-02-15 | Payer: Self-pay | Admitting: Family Medicine

## 2010-02-28 ENCOUNTER — Encounter: Payer: Self-pay | Admitting: Physician Assistant

## 2010-03-09 ENCOUNTER — Encounter: Payer: Self-pay | Admitting: Gastroenterology

## 2010-04-04 ENCOUNTER — Encounter (INDEPENDENT_AMBULATORY_CARE_PROVIDER_SITE_OTHER): Payer: Self-pay | Admitting: *Deleted

## 2010-04-05 ENCOUNTER — Ambulatory Visit: Payer: Self-pay | Admitting: Family Medicine

## 2010-04-05 DIAGNOSIS — J209 Acute bronchitis, unspecified: Secondary | ICD-10-CM | POA: Insufficient documentation

## 2010-04-11 ENCOUNTER — Encounter: Payer: Self-pay | Admitting: Family Medicine

## 2010-04-11 ENCOUNTER — Telehealth: Payer: Self-pay | Admitting: Family Medicine

## 2010-04-13 ENCOUNTER — Encounter: Payer: Self-pay | Admitting: Family Medicine

## 2010-04-17 ENCOUNTER — Ambulatory Visit: Payer: Self-pay | Admitting: Gastroenterology

## 2010-04-18 ENCOUNTER — Encounter: Payer: Self-pay | Admitting: Gastroenterology

## 2010-04-19 ENCOUNTER — Encounter: Payer: Self-pay | Admitting: Family Medicine

## 2010-04-19 ENCOUNTER — Ambulatory Visit: Payer: Self-pay | Admitting: Otolaryngology

## 2010-04-23 ENCOUNTER — Telehealth: Payer: Self-pay | Admitting: Family Medicine

## 2010-04-24 DIAGNOSIS — F329 Major depressive disorder, single episode, unspecified: Secondary | ICD-10-CM

## 2010-04-24 DIAGNOSIS — F3289 Other specified depressive episodes: Secondary | ICD-10-CM | POA: Insufficient documentation

## 2010-04-25 ENCOUNTER — Telehealth (INDEPENDENT_AMBULATORY_CARE_PROVIDER_SITE_OTHER): Payer: Self-pay | Admitting: *Deleted

## 2010-05-03 ENCOUNTER — Encounter: Payer: Self-pay | Admitting: Family Medicine

## 2010-05-08 ENCOUNTER — Encounter: Payer: Self-pay | Admitting: Gastroenterology

## 2010-05-15 ENCOUNTER — Encounter: Payer: Self-pay | Admitting: Family Medicine

## 2010-05-16 ENCOUNTER — Telehealth (INDEPENDENT_AMBULATORY_CARE_PROVIDER_SITE_OTHER): Payer: Self-pay | Admitting: *Deleted

## 2010-05-17 ENCOUNTER — Encounter: Payer: Self-pay | Admitting: Family Medicine

## 2010-05-17 ENCOUNTER — Ambulatory Visit
Admission: RE | Admit: 2010-05-17 | Discharge: 2010-05-17 | Payer: Self-pay | Source: Home / Self Care | Attending: Otolaryngology | Admitting: Otolaryngology

## 2010-05-18 ENCOUNTER — Encounter: Payer: Self-pay | Admitting: Family Medicine

## 2010-05-20 LAB — CONVERTED CEMR LAB
Total CHOL/HDL Ratio: 1.8
VLDL: 12 mg/dL (ref 0–40)

## 2010-05-21 ENCOUNTER — Telehealth: Payer: Self-pay | Admitting: Family Medicine

## 2010-05-21 ENCOUNTER — Ambulatory Visit
Admission: RE | Admit: 2010-05-21 | Discharge: 2010-05-21 | Payer: Self-pay | Source: Home / Self Care | Attending: Family Medicine | Admitting: Family Medicine

## 2010-05-24 ENCOUNTER — Encounter: Payer: Self-pay | Admitting: Family Medicine

## 2010-05-27 ENCOUNTER — Encounter: Payer: Self-pay | Admitting: Family Medicine

## 2010-06-06 NOTE — Assessment & Plan Note (Signed)
Summary: new pt   Vital Signs:  Patient profile:   55 year old female Menstrual status:  postmenopausal Height:      60 inches Weight:      117.25 pounds BMI:     22.98 O2 Sat:      87 % on Room air Pulse rate:   99 / minute Resp:     16 per minute BP sitting:   117 / 74  (left arm)  Vitals Entered By: Jimmey Ralph LPN (June 13, 4399 2:20 PM) CC: New Patient Is Patient Diabetic? No Pain Assessment Patient in pain? yes     Location: abdomen Type: sharp Onset of pain  Chronic Nutritional Status BMI of 19 -24 = normal     Menstrual Status postmenopausal Last PAP Result normal    Primary Care Provider:  Kennith Gain PA  CC:  New Patient.  History of Present Illness: New pt presents today to establish care.  Also needs a refill of her Valium, which she uses two times a day for intestinal spasms due to Chron's disease, and some anxiety.  Pt is seeing Dr Oneida Alar regularly for her Chron's disease & all of her GI meds & Vicoden are refilled by Dr. Oneida Alar.  Dr. Oneida Alar is also following & working up her elevated liver enzymes.  Pt has a hx of asthma.  She d/c Advair "awhile ago"  due to cost.  Has wheezing & tightness which she uses Proair for avg 2 d/wk.  She notices increased asthma symptoms with change of weather, & with increased allergy symptoms.  She tried Benadryl for her allergies, but didn't like it.  Has not tried other OTC or Rx allergy meds.  Hx of HTN.  Currently controlled with meds.  Has 1 refill left on current rx's.   Pts Mamm, Pap, and Bone Density test are UTD.  Labs also UTD, except Vit D. She is uncertain of her last Tetnus vaccine.  Preventive Screening-Counseling & Management  Alcohol-Tobacco     Smoking Cessation Counseling: yes  Medications Prior to Update: 1)  Hydrocodone-Acetaminophen 5-500 Mg Tabs (Hydrocodone-Acetaminophen) .... One Three Times A Day As Needed 2)  Amlodipine Besylate 5 Mg  Tabs (Amlodipine Besylate) .... One Daily 3)  Proair  Hfa 108 (90 Base) Mcg/act  Aers (Albuterol Sulfate) .... Two Puffs Every 6 Hours As Needed 4)  Benazepril Hcl 10 Mg  Tabs (Benazepril Hcl) .... One Daily 5)  Hydrochlorothiazide 12.5 Mg  Caps (Hydrochlorothiazide) .... One Daily 6)  Cholestyramine 4 Gm/dose Powd (Cholestyramine) .... 4 Gram Bid.  Do Not Take Within 2 Hours of Other Meds.  Hold For Constipation. 7)  Valium 5 Mg Tabs (Diazepam) .... One Every 6 Hours As Needed Per Dr. Arnoldo Morale 8)  Advair Diskus 100-50 Mcg/dose Aepb (Fluticasone-Salmeterol) .... Two Times A Day 9)  Tums .... As Needed 10)  Omeprazole 20 Mg Cpdr (Omeprazole) .Marland Kitchen.. 1 By Mouth 30 Minutes Before Her First Meal  Allergies: 1)  ! Flexeril (Cyclobenzaprine Hcl)  Past History:  Risk Factors: Smoking Status: current (08/01/2008) Packs/Day: 1 per day - now down to pack every 4 days (08/01/2008)  Past medical, surgical, family and social histories (including risk factors) reviewed, and no changes noted (except as noted below).  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 1. hernia removal - old abd scar 2. broken jaw repair 3. Colectomy due to Chron's 4. Tubal Pregnancy with tube removals 5. CTS left wrist 6. Left L5-S1 diskectomy using microdissection - April 2010 - Dr. Arnoldo Morale  Family History: Reviewed history from 10/18/2008 and no changes  required. Father: 66 CAD with hx MI at age 8 Mother: 36 Hypothyroidism and Hx related to bleeding Siblings: Brothers - 66 - Healthy Sister - 52 HTN, 64 Chrons - severe Kids - boy age 8 healthy and girl age 30 - vertigo  Social History: Reviewed history from 10/18/2008 and no changes required. 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  Has begun disability application.  Single-long term relationship Current Smoker Alcohol use-no Drug use-no Lives with boyfriend Education: 11th grade  Review of Systems General:  Denies fatigue, fever, weakness, and weight loss. Eyes:  Denies blurring, discharge, double vision, eye irritation, and eye pain; Wears glasses.  Last eye exam approx 3 yrs ago. ENT:  Complains of earache and nasal congestion; denies postnasal drainage, sinus pressure, and sore throat; Intermittent discomfort Rt ear, which she associates with her allergy symptoms.. CV:  Complains of lightheadness and palpitations; denies chest pain or discomfort, fainting, and shortness of breath with exertion; Occ lightheadedness with change of positions. Heart racing with over exertion.Marland Kitchen Resp:  Complains of cough, shortness of breath, and wheezing; Hx of Asthma.  Pt D/C Advair inhaler due to cost.  Uses albuterol avg 2 x / wk, & notices has more asthma symptoms with increased allergy symptoms.Marland Kitchen GI:  Complains of abdominal pain. GU:  Denies abnormal vaginal bleeding, dysuria, incontinence, and urinary frequency. MS:  Complains of muscle aches; denies joint pain and joint swelling; Rt UE aching, hx of Rt CTS.  Uses wrist splint at HS.  Had surgery of Lt CTS.Marland Kitchen Neuro:  Complains of headaches, numbness, and tingling; denies visual disturbances and weakness; Hx of Migraines.  Have decreased in freq over the last few yrs.  Only has them rarely now. Numbness & tingling Rt. UE due to CTS.  Had EMG in past.  . Psych:  Complains of anxiety; denies depression. Allergy:  Complains of seasonal  allergies and sneezing; Hx of seasonal allergies, also allergic to pets, & she has a dog.Marland Kitchen  Physical Exam  General:  alert, well-developed, well-nourished, and well-hydrated.   Head:  normocephalic and atraumatic.   Eyes:  pupils equal, pupils round, pupils reactive to light, pupils react to accomodation, no optic disk abnormalities, and no retinal abnormalitiies.   Ears:  R ear normal and L ear normal.   Nose:  no external deformity, no external erythema, no nasal discharge, no mucosal pallor, and mucosal edema.   Mouth:  pharynx pink and moist, no erythema, no exudates, no postnasal drip, and no leukoplakia.   Neck:  supple, no masses, and no thyromegaly.   Lungs:  normal respiratory effort, R wheezes, and L wheezes & rhonci scattered.   Heart:  normal rate, regular rhythm, and no murmur.   Abdomen:  soft, normal bowel sounds, no distention, no masses, no guarding, no rigidity, no hepatomegaly, no splenomegaly, RUQ tenderness, RLQ tenderness, LUQ tenderness, and LLQ tenderness.   Msk:  no joint swelling, no redness over joints, no joint deformities, and no muscle atrophy.  Pulses:  R radial normal, R posterior tibial normal, R dorsalis pedis normal, L radial normal, L posterior tibial normal, and L dorsalis pedis normal.   Neurologic:  alert & oriented X3 and DTRs symmetrical and normal.   Skin:  color normal.   Cervical Nodes:  no anterior cervical adenopathy and no posterior cervical adenopathy.   Psych:  Oriented X3, memory intact for recent and remote, normally interactive, good eye contact, and not anxious appearing.     Impression & Recommendations:  Problem # 1:  CROHN'S DISEASE (ICD-555.9) Assessment Unchanged Pt will continue care with Dr. Oneida Alar. Will refill her Valium today & will monitor use.  Problem # 2:  ASTHMA, MILD (ICD-493.90)  Her updated medication list for this problem includes:    Proair Hfa 108 (90 Base) Mcg/act Aers (Albuterol sulfate) .Marland Kitchen..Marland Kitchen Two puffs every  6 hours as needed    Advair Diskus 100-50 Mcg/dose Aepb (Fluticasone-salmeterol) .Marland Kitchen..Marland Kitchen Two times a day  Will try to get pt assistance for Advair.  [facility] Discussed D/C smoking.  She has already cut back to 1 pack q 3 days & wants to quit. Continue Proair as needed. Suspect COPD.  Will order PFT to be done before 1 mos follow up appt.  Problem # 3:  ALLERGIC RHINITIS (ICD-477.9) Assessment: Deteriorated  Will prescibe Loratadine to take daily yr round.  Her updated medication list for this problem includes:    Loratadine 10 Mg Tabs (Loratadine) .Marland Kitchen... 1 daily  Problem # 4:  HYPERTENSION (ICD-401.9) Assessment: Unchanged  Her updated medication list for this problem includes:    Amlodipine Besylate 5 Mg Tabs (Amlodipine besylate) ..... One daily    Benazepril Hcl 10 Mg Tabs (Benazepril hcl) ..... One daily    Hydrochlorothiazide 12.5 Mg Caps (Hydrochlorothiazide) ..... One daily  Continue current medications. Labs UTD.  BP today: 117/74 Prior BP: 118/84 (04/19/2009)  Prior 10 Yr Risk Heart Disease: 3 % (01/11/2009)  Labs Reviewed: K+: 4.2 (12/22/2008) Creat: : 0.58 (12/22/2008)   Chol: 308 (12/22/2008)   HDL: 191 (12/22/2008)   LDL: 98 (12/22/2008)   TG: 93 (12/22/2008)  Complete Medication List: 1)  Hydrocodone-acetaminophen 5-500 Mg Tabs (Hydrocodone-acetaminophen) .... One three times a day as needed 2)  Amlodipine Besylate 5 Mg Tabs (Amlodipine besylate) .... One daily 3)  Proair Hfa 108 (90 Base) Mcg/act Aers (Albuterol sulfate) .... Two puffs every 6 hours as needed 4)  Benazepril Hcl 10 Mg Tabs (Benazepril hcl) .... One daily 5)  Hydrochlorothiazide 12.5 Mg Caps (Hydrochlorothiazide) .... One daily 6)  Cholestyramine 4 Gm/dose Powd (Cholestyramine) .... 4 gram bid.  do not take within 2 hours of other meds.  hold for constipation. 7)  Valium 5 Mg Tabs (Diazepam) .... One every 6 hours as needed per dr. Arnoldo Morale 8)  Advair Diskus 100-50 Mcg/dose Aepb  (Fluticasone-salmeterol) .... Two times a day 9)  Tums  .... As needed 10)  Omeprazole 20 Mg Cpdr (Omeprazole) .Marland Kitchen.. 1 by mouth 30 minutes before her first meal 11)  Loratadine 10 Mg Tabs (Loratadine) .Marland Kitchen.. 1 daily 12)  Valium 5 Mg Tabs (Diazepam) .... 1/2 tab twice daily as needed  Other Orders: T-Vitamin D (25-Hydroxy) [phone]) Tdap => 107yr IM (([id number] Admin 1st Vaccine ((54270   Patient Instructions: 1)  Please schedule a follow-up appointment in 1 month. 2)  Tobacco is very bad for your health and your loved ones! You Should stop smoking!. Prescriptions: LORATADINE 10 MG TABS (LORATADINE) 1 daily  #90 x 3  Entered and Authorized by:   Kennith Gain PA   Signed by:   Kennith Gain PA on 06/12/2009   Method used:   Electronically to        [facility]. [phone]* (retail)       [address]       [place], [place]  [address]       Ph: [phone] or [phone]       Fax: [phone]   RxID:   [id number] VALIUM 5 MG TABS (DIAZEPAM) 1/2 tab twice daily as needed  #30 x 0   Entered and Authorized by:   Kennith Gain PA   Signed by:   Kennith Gain PA on 06/12/2009   Method used:   Printed then faxed to ...       [address] [phone]* (retail)       [address]       [place], [place]  [address]       Ph: [phone] or [phone]       Fax: [phone]   RxID:   [id number] VALIUM 5 MG TABS (DIAZEPAM) 1/2 tab twice daily as needed  #30 x 0   Entered and Authorized by:   Kennith Gain PA   Signed by:   Kennith Gain PA on 06/12/2009   Method used:   Printed then faxed to ...       [address] [phone]* (retail)       [address]       [place], [place]  [address]       Ph: [phone] or [phone]       Fax: [phone]   RxID:   [id number] LORATADINE 10 MG TABS (LORATADINE) 1 daily  #90 x 3   Entered and Authorized by:   Kennith Gain PA   Signed by:   Kennith Gain PA on 06/12/2009   Method used:    Electronically to        [facility]. [phone]* (retail)       [address]       [place], [place]  [address]       Ph: [phone] or [phone]       Fax: [phone]   RxID:   [id number]    Tetanus/Td Vaccine    Vaccine Type: Tdap    Site: left deltoid    Mfr: Sanofi Pasteur    Dose: 0.5 ml    Route: IM    Given by: Jimmey Ralph LPN    Exp. Date: 06/03/2011    Lot #: [id number]    VIS given: 03/24/07 version given June 12, 2009.

## 2010-06-06 NOTE — Progress Notes (Signed)
  Phone Note From Pharmacy   Caller: [facility]. [phone]* Summary of Call: requesting refills on diazepam. will we be responsible for this med?  the directions state per dr Arnoldo Morale Initial call taken by: Jimmey Ralph LPN,  June 14, 7845 4:54 PM    Yes, we are responsible for this Rx.  I had refilled it on the day she was in.  Pharmacy must not have gotten the fax. Kennith Gain PA  June 15, 2009 3:10 PM

## 2010-06-06 NOTE — Assessment & Plan Note (Signed)
Summary: follow up- room 1   Vital Signs:  Patient profile:   55 year old female Menstrual status:  postmenopausal Height:      60 inches Weight:      119 pounds BMI:     23.32 O2 Sat:      98 % on Room air Pulse rate:   91 / minute Resp:     16 per minute BP sitting:   128 / 86  (left arm)  Vitals Entered By: Baldomero Lamy LPN (January 20, 239 10:56 AM) CC: follow-up visit Is Patient Diabetic? No Pain Assessment Patient in pain? no        Primary Provider:  Claybon Jabs, PA  CC:  follow-up visit.  History of Present Illness: Pt presents today with c/o chest congestion and cough x a lttle over a week.  cough is productive with thick dark phlegm.  Cough worse in am after awakens.  Little nasal congestion.  No fever or chills.  Is still smoking a few cigs per day.  Tried to quit.  Also has applied for disablitiy.  Needs forms completed.  Pt states she needs them completed by PCP and will be taking a copy to Dr Oneida Alar also.      Allergies (verified): No Known Drug Allergies  Past History:  Past medical history reviewed for relevance to current acute and chronic problems.  Past Medical History: Reviewed history from 11/21/2009 and no changes required. 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 JUNE 2011 **USED ETOH IN 2009 **2009: ANA, ASMA  TCS  JAN 2009-Anastamotic ulcer 2O TO ISCHEMIA, no microscopic colitis Esophageal Stricture and gastritis-EGD/dilation JAN 2009 Diarrhea multifactorial: lactose intolerance, bile-salt, IBS, SBBO (2008) SB Crohn's disease-R hemicolectomy MAR 2006 Anxiety HTN Chronic abdominal and back pain NECK PAIN, LEFT (ICD-723.1) WRIST PAIN, LEFT (ICD-719.43) HYPERLIPIDEMIA (ICD-272.4) VARCIES, SITE NEC (ICD-456.8) LUMP OR MASS IN BREAST (ICD-611.72) TOBACCO ABUSE (ICD-305.1) CARPAL TUNNEL SYNDROME (ICD-354.0) MIGRAINE HEADACHE (ICD-346.90) ASTHMA, WITH ACUTE EXACERBATION (ICD-493.92) ALLERGIC RHINITIS (ICD-477.9)  Review of  Systems General:  Denies chills and fever. ENT:  Complains of nasal congestion; denies earache, postnasal drainage, sinus pressure, and sore throat. CV:  Denies chest pain or discomfort. Resp:  Complains of cough, shortness of breath, sputum productive, and wheezing. GI:  Complains of abdominal pain, diarrhea, gas, nausea, and vomiting.  Physical Exam  General:  Well-developed,well-nourished,in no acute distress; alert,appropriate and cooperative throughout examination Head:  Normocephalic and atraumatic without obvious abnormalities. No apparent alopecia or balding. Ears:  External ear exam shows no significant lesions or deformities.  Otoscopic examination reveals clear canals, tympanic membranes are intact bilaterally without bulging, retraction, inflammation or discharge. Hearing is grossly normal bilaterally. Nose:  External nasal examination shows no deformity or inflammation. Nasal mucosa are pink and moist without lesions or exudates.  Lt nasal polyp unchanged. Mouth:  Oral mucosa and oropharynx without lesions or exudates.  Teeth in good repair. Neck:  No deformities, masses, or tenderness noted. Lungs:  Normal respiratory effort, chest expands symmetrically. Lungs are clear to auscultation, no crackles or wheezes. Heart:  Normal rate and regular rhythm. S1 and S2 normal without gallop, murmur, click, rub or other extra sounds. Cervical Nodes:  No lymphadenopathy noted Psych:  Cognition and judgment appear intact. Alert and cooperative with normal attention span and concentration. No apparent delusions, illusions, hallucinations   Impression & Recommendations:  Problem # 1:  BRONCHITIS, CHRONIC (ICD-491.9) Assessment Deteriorated Encouraged pt to discontinue smoking.  Problem # 2:  CROHN'S DISEASE (ICD-555.9) Assessment: Comment  Only  Problem # 3:  HYPERTENSION (ICD-401.9) Assessment: Comment Only  Her updated medication list for this problem includes:    Amlodipine  Besylate 5 Mg Tabs (Amlodipine besylate) ..... One daily    Benazepril Hcl 10 Mg Tabs (Benazepril hcl) ..... One daily    Hydrochlorothiazide 12.5 Mg Caps (Hydrochlorothiazide) ..... One daily  BP today: 128/86 Prior BP: 108/82 (11/21/2009)  Prior 10 Yr Risk Heart Disease: 3 % (01/11/2009)  Labs Reviewed: K+: 4.7 (11/16/2009) Creat: : 0.67 (11/16/2009)   Chol: 255 (11/16/2009)   HDL: 137 (11/16/2009)   LDL: 103 (11/16/2009)   TG: 73 (11/16/2009)  Complete Medication List: 1)  Hydrocodone-acetaminophen 5-500 Mg Tabs (Hydrocodone-acetaminophen) .... One three times a day as needed 2)  Amlodipine Besylate 5 Mg Tabs (Amlodipine besylate) .... One daily 3)  Proair Hfa 108 (90 Base) Mcg/act Aers (Albuterol sulfate) .... Two puffs every 6 hours as needed 4)  Benazepril Hcl 10 Mg Tabs (Benazepril hcl) .... One daily 5)  Hydrochlorothiazide 12.5 Mg Caps (Hydrochlorothiazide) .... One daily 6)  Cholestyramine 4 Gm/dose Powd (Cholestyramine) .... 4 gram one -two times daily.  do not take within 2 hours of other meds.  hold for constipation. 7)  Tums  .... As needed 8)  Omeprazole 20 Mg Cpdr (Omeprazole) .Marland Kitchen.. 1 by mouth 30 minutes before her first meal 9)  Valium 5 Mg Tabs (Diazepam) .... 1/2 tab twice daily as needed 10)  Symbicort 160-4.5 Mcg/act Aero (Budesonide-formoterol fumarate) .... Take 2 puffs bid 11)  Levsin/sl 0.125 Mg Subl (Hyoscyamine sulfate) .Marland Kitchen.. 1-2 sl q4-6h as needed abd pain and cramping 12)  Doxycycline Hyclate 100 Mg Caps (Doxycycline hyclate) .... Take 1 two times a day x 10 days  Patient Instructions: 1)  Keep your next appt. 2)  Take 10 mg of Prednisone once a day for 5 days. (1/2 tablet of the 20 mg) 3)  I have prescribed an antibiotic for you. 4)  Continue using your inhalers. 5)  Tobacco is very bad for your health and your loved ones! You Should stop smoking!. 6)  Stop Smoking Tips: Choose a Quit date. Cut down before the Quit date. decide what you will do as a  substitute when you feel the urge to smoke(gum,toothpick,exercise). Prescriptions: DOXYCYCLINE HYCLATE 100 MG CAPS (DOXYCYCLINE HYCLATE) take 1 two times a day x 10 days  #20 x 0   Entered and Authorized by:   Kennith Gain PA   Signed by:   Kennith Gain PA on 01/19/2010   Method used:   Electronically to        [facility]. [phone]* (retail)       [address]       [place], [place]  [address]       Ph: [phone] or [phone]       Fax: [phone]   RxID:   [id number]

## 2010-06-06 NOTE — Letter (Signed)
Summary: Letter  Letter   Imported By: Dierdre Harness 12/13/2009 10:54:36  _____________________________________________________________________  External Attachment:    Type:   Image     Comment:   External Document

## 2010-06-06 NOTE — Assessment & Plan Note (Signed)
Summary: sick/pain- room 3   Vital Signs:  Patient profile:   55 year old female Menstrual status:  postmenopausal Height:      60 inches Weight:      115.50 pounds BMI:     22.64 O2 Sat:      99 % on Room air Pulse rate:   99 / minute Resp:     16 per minute BP sitting:   100 / 80  (left arm)  Vitals Entered By: Baldomero Lamy LPN (July 22, 2023 4:27 AM) CC: head congestion, cough, right wrist pain Is Patient Diabetic? No Pain Assessment Patient in pain? yes     Location: right wrist Intensity: 6 Type: sharp Onset of pain  Constant   Primary Provider:  Kennith Gain PA  CC:  head congestion, cough, and right wrist pain.  History of Present Illness: Pt is here today with c/o head congestion & drainage x 1 wk.  + cough.  Nasal mucus is discolored.  Cough nonprod.  No fever.  Has cut back on smoking but hasn't quit yet.  Had PFT recently.  Also c/o pain Rt elbow & forearm area.  Pain, & tingling in Rt 3rd, 4th & 5th digits.  Hx of CTS.  Wearing splint not not much help.  Not sleeping well due to pain. Has hydrocodone 5 mg at home & not helping.  Requesting increase dose to help.  Unable to take NSAIDs.  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  Seeing Dr Oneida Alar re: GI probs.  Going to have liver bx.  Past History:  Past medical history reviewed for relevance to current acute and chronic problems.  Past Medical History: Reviewed history from 06/12/2009 and no changes required. Anastamotic ulcer-TCS  JAN 2009 Esophageal Stricture and gastritis-EGD/dilation JAN 2009 Diarrhea multifactorial: lactose intolerance, bile-salt, IBS Crohn's disease-R hemicolectomy Anxiety HTN Chronic abdominal and back pain NECK PAIN, LEFT (ICD-723.1) WRIST PAIN, LEFT (ICD-719.43) HYPERLIPIDEMIA (ICD-272.4) VARCIES, SITE NEC (ICD-456.8) LUMP OR MASS IN BREAST (ICD-611.72) TOBACCO ABUSE (ICD-305.1) CARPAL TUNNEL SYNDROME (ICD-354.0) MIGRAINE HEADACHE  (ICD-346.90) ASTHMA, WITH ACUTE EXACERBATION (ICD-493.92) ALLERGIC RHINITIS (ICD-477.9)  Review of Systems General:  Denies chills and fever. ENT:  Complains of nasal congestion, postnasal drainage, and sinus pressure; denies earache and sore throat. CV:  Denies chest pain or discomfort. Resp:  Complains of cough and sputum productive; denies shortness of breath and wheezing. MS:  Complains of joint pain and muscle aches; denies joint redness and joint swelling. Neuro:  Complains of numbness and tingling.  Physical Exam  General:  Well-developed,well-nourished,in no acute distress; alert,appropriate and cooperative throughout examination Head:  Normocephalic and atraumatic without obvious abnormalities. No apparent alopecia or balding. Ears:  External ear exam shows no significant lesions or deformities.  Otoscopic examination reveals clear canals, tympanic membranes are intact bilaterally without bulging, retraction, inflammation or discharge. Hearing is grossly normal bilaterally. Nose:  no external deformity, mucosal erythema, and mucosal edema.  yellow mucus Mouth:  Oral mucosa and oropharynx without lesions or exudates.  Teeth in good repair. Neck:  No deformities, masses, or tenderness noted. Lungs:  Normal respiratory effort, chest expands symmetrically. Lungs are clear to auscultation, no crackles or wheezes. Heart:  Normal rate and regular rhythm. S1 and S2 normal without gallop, murmur, click, rub or other extra sounds. Msk:  FROM Rt elbow & shoulder. Decreased flexion & extension of Rt wrist due to pain.  TTP Rt medial epicondyle.  Nontender wrist & hand.  Neg Tinnels & phalens. Pulses:  R radial normal.  Cap refill < 3 sec Extremities:  No clubbing, cyanosis, edema, or deformity noted with normal full range of motion of all joints.   Neurologic:  alert & oriented X3 and sensation intact to light touch.   Skin:  Intact without suspicious lesions or rashes Cervical Nodes:  No  lymphadenopathy noted Psych:  Cognition and judgment appear intact. Alert and cooperative with normal attention span and concentration. No apparent delusions, illusions, hallucinations   Impression & Recommendations:  Problem # 1:  SINUSITIS, ACUTE (ICD-461.9) Assessment New  Her updated medication list for this problem includes:    Bactrim Ds 800-160 Mg Tabs (Sulfamethoxazole-trimethoprim) .Marland Kitchen... Take 1 two times a day x 10 days  Problem # 2:  ASTHMA, MILD (ICD-493.90) Assessment: Unchanged Discussed recent PFT.  Pt is unable to afford Advair or similar. Will check into resources or coupons that may help pt. She will continue ProAir as needed at this time.   Has a f/u appt in 2 wks.  Her updated medication list for this problem includes:    Proair Hfa 108 (90 Base) Mcg/act Aers (Albuterol sulfate) .Marland Kitchen..Marland Kitchen Two puffs every 6 hours as needed    Advair Diskus 100-50 Mcg/dose Aepb (Fluticasone-salmeterol) .Marland Kitchen..Marland Kitchen Two times a day  Problem # 3:  MEDIAL EPICONDYLITIS, RIGHT (ICD-726.31) Assessment: [place]. Will increase Hydrocodone for short term only.  Orders: Depo- Medrol 40m (J1040) Admin of Therapeutic Inj  intramuscular or subcutaneous (([id number]  Complete Medication List: 1)  Hydrocodone-acetaminophen 5-500 Mg Tabs (Hydrocodone-acetaminophen) .... One three times a day as needed 2)  Amlodipine Besylate 5 Mg Tabs (Amlodipine besylate) .... One daily 3)  Proair Hfa 108 (90 Base) Mcg/act Aers (Albuterol sulfate) .... Two puffs every 6 hours as needed 4)  Benazepril Hcl 10 Mg Tabs (Benazepril hcl) .... One daily 5)  Hydrochlorothiazide 12.5 Mg Caps (Hydrochlorothiazide) .... One daily 6)  Cholestyramine 4 Gm/dose Powd (Cholestyramine) .... 4 gram one -two times daily.  do not take within 2 hours of other meds.  hold for constipation. 7)  Valium 5 Mg Tabs (Diazepam) .... 1/2 tablet twice daily 8)  Advair Diskus 100-50 Mcg/dose Aepb (Fluticasone-salmeterol) .... Two times a day 9)   Tums  .... As needed 10)  Omeprazole 20 Mg Cpdr (Omeprazole) ..Marland Kitchen. 1 by mouth 30 minutes before her first meal 11)  Valium 5 Mg Tabs (Diazepam) .... 1/2 tab twice daily as needed 12)  Bactrim Ds 800-160 Mg Tabs (Sulfamethoxazole-trimethoprim) .... Take 1 two times a day x 10 days 13)  Hydrocodone-acetaminophen 7.5-500 Mg Tabs (Hydrocodone-acetaminophen) .... Take 1 q 6 hrs as needed for pain.  do not take with hydrocodone 5 mg.  Patient Instructions: 1)  Keep your next appt. 2)  Ice your elbow 3-4 times a day for 10-15 mins. 3)  You have received a shot of Depo Medrol to help with inflammation. 4)  I have increased your Hydrocodone dose temporarily.  You will go back to the 5 mg dose once your elbow is feeling better. 5)  I have prescribed an antibiotic for your sinus infection. 6)  Tobacco is very bad for your health and your loved ones! You Should stop smoking!. 7)  Stop Smoking Tips: Choose a Quit date. Cut down before the Quit date. decide what you will do as a substitute when you feel the urge to smoke(gum,toothpick,exercise). Prescriptions: HYDROCODONE-ACETAMINOPHEN 7.5-500 MG TABS (HYDROCODONE-ACETAMINOPHEN) take 1 q 6 hrs as needed for pain.  Do not take  with hydrocodone 5 mg.  #20 x 0   Entered and Authorized by:   Kennith Gain PA   Signed by:   Baldomero Lamy LPN on 59/47/0761   Method used:   Printed then faxed to ...       [address] [phone]* (retail)       [address]       [place], [place]  [address]       Ph: [phone] or [phone]       Fax: [phone]   RxID:   [id number] BACTRIM DS 800-160 MG TABS (SULFAMETHOXAZOLE-TRIMETHOPRIM) take 1 two times a day x 10 days  #20 x 0   Entered and Authorized by:   Kennith Gain PA   Signed by:   Baldomero Lamy LPN on 17/47/1595   Method used:   Electronically to        [facility] [phone]* (retail)       [address]       [place], [place]  [address]       Ph: [phone] or  [phone]       Fax: [phone]   RxID:   [id number]    Medication Administration  Injection # 1:    Medication: Depo- Medrol 35m    Diagnosis: MEDIAL EPICONDYLITIS, RIGHT (ICD-726.31)    Route: IM    Site: LUOQ gluteus    Exp Date: 11/11    Lot #: [id number]   Mfr: [employer]    Patient tolerated injection without complications    Given by: JBaldomero LamyLPN (March 18, 2587297:61AM)  Orders Added: 1)  Depo- Medrol 840m[J1040] 2)  Admin of Therapeutic Inj  intramuscular or subcutaneous [96372] 3)  Est. Patient Level IV [9[84859]

## 2010-06-06 NOTE — Assessment & Plan Note (Signed)
Summary: ov   Vital Signs:  Patient profile:   55 year old female Menstrual status:  postmenopausal Height:      60 inches Weight:      114.50 pounds BMI:     22.44 O2 Sat:      97 % on Room air Pulse rate:   94 / minute Pulse rhythm:   regular Resp:     16 per minute BP sitting:   120 / 70  (left arm)  Vitals Entered By: Baldomero Lamy LPN (April 05, 2457 10:28 AM)  O2 Flow:  Room air CC: follow-up visit Is Patient Diabetic? No   Primary Care Provider:  Tula Nakayama MD  CC:  follow-up visit.  History of Present Illness: 1 year h/o intermittent voice loss with hoarseness, is painful with speech, quit nicotine several weeks  ago. Needs ENT eval. Has chrons, with severe reflux, chrons and IBS, has severe heartburn, has had dilatation twice, most recent was earlier this year, Dr Oneida Alar. Reports  that she had been doing fairly well up until 1 week ago when she developed a productive cough and chills Denies recent fever  Denies sinus pressure, nasal congestion , ear pain or sore throat.  Denies chest pain, palpitations, PND, orthopnea or leg swelling. Denies abdominal pain, nausea, vomitting, diarrhea or constipation. Denies change in bowel movements or bloody stool. Denies dysuria , frequency, incontinence or hesitancy. Denies  joint pain, swelling, or reduced mobility. Denies headaches, vertigo, seizures. Denies depression, anxiety or insomnia. Denies  rash, lesions, or itch.     Current Medications (verified): 1)  Hydrocodone-Acetaminophen 5-500 Mg Tabs (Hydrocodone-Acetaminophen) .... One Three Times A Day As Needed 2)  Amlodipine Besylate 5 Mg  Tabs (Amlodipine Besylate) .... One Daily 3)  Proair Hfa 108 (90 Base) Mcg/act  Aers (Albuterol Sulfate) .... Two Puffs Every 6 Hours As Needed 4)  Tums .... As Needed 5)  Omeprazole 20 Mg Cpdr (Omeprazole) .Marland Kitchen.. 1 By Mouth 30 Minutes Before Her First Meal 6)  Valium 5 Mg Tabs (Diazepam) .... 1/2 Tab Twice Daily As  Needed 7)  Symbicort 160-4.5 Mcg/act Aero (Budesonide-Formoterol Fumarate) .... Take 2 Puffs Two Times A Day. 8)  Accuretic 10-12.5 Mg Tabs (Quinapril-Hydrochlorothiazide) .... One Tab By Mouth Once Daily  Allergies (verified): No Known Drug Allergies  Review of Systems      See HPI General:  Complains of chills and fatigue. Eyes:  Denies discharge and red eye. Resp:  Complains of cough, sputum productive, and wheezing; 1 week h/o yellow sputum with chills, posterior left chestpain with cough and deep breathing. Endo:  Denies cold intolerance, excessive hunger, excessive thirst, and excessive urination. Heme:  Denies abnormal bruising and bleeding. Allergy:  Complains of seasonal allergies; denies hives or rash and itching eyes.  Physical Exam  General:  Well-developed,well-nourished,in no acute distress; alert,appropriate and cooperative throughout examination HEENT: No facial asymmetry,  EOMI, No sinus tenderness, TM's Clear, oropharynx  pink and moist.   Chest: decreased air entry, cracles bilateral, few wheezes CVS: S1, S2, No murmurs, No S3.   Abd: Soft, Nontender.  MS: Adequate ROM spine, hips, shoulders and knees.  Ext: No edema.   CNS: CN 2-12 intact, power tone and sensation normal throughout.   Skin: Intact, no visible lesions or rashes.  Psych: Good eye contact, normal affect.  Memory intact, not anxious or depressed appearing.    Impression & Recommendations:  Problem # 1:  ACUTE BRONCHITIS (ICD-466.0) Assessment Comment Only  Her updated medication list  for this problem includes:    Proair Hfa 108 (90 Base) Mcg/act Aers (Albuterol sulfate) .Marland Kitchen..Marland Kitchen Two puffs every 6 hours as needed    Symbicort 160-4.5 Mcg/act Aero (Budesonide-formoterol fumarate) .Marland Kitchen... Take 2 puffs two times a day.    Doxycycline Hyclate 100 Mg Caps (Doxycycline hyclate) .Marland Kitchen... Take 1 capsule by mouth two times a day  Problem # 2:  HYPERLIPIDEMIA (ICD-272.4) Assessment: Comment Only  The  following medications were removed from the medication list:    Cholestyramine 4 Gm/dose Powd (Cholestyramine) .Marland KitchenMarland KitchenMarland KitchenMarland Kitchen 4 gram one -two times daily.  do not take within 2 hours of other meds.  hold for constipation.  Orders: T-Lipid Profile [phone]) Low fat dietdiscussed and encouraged  Labs Reviewed: SGOT: 36 (11/16/2009)   SGPT: 25 (11/16/2009)  Lipid Goals: Chol Goal: 200 (02/03/2007)   HDL Goal: 40 (02/03/2007)   LDL Goal: 160 (05/04/2007)   TG Goal: 150 (02/03/2007)  Prior 10 Yr Risk Heart Disease: 3 % (01/11/2009)   HDL:137 (11/16/2009), 191 (12/22/2008)  LDL:103 (11/16/2009), 98 (12/22/2008)  Chol:255 (11/16/2009), 308 (12/22/2008)  Trig:73 (11/16/2009), 93 (12/22/2008)  Problem # 3:  HOARSENESS (BHA-193.79) Assessment: Deteriorated  Orders: ENT Referral (ENT)  Problem # 4:  HYPERTENSION (ICD-401.9) Assessment: Unchanged  The following medications were removed from the medication list:    Benazepril Hcl 10 Mg Tabs (Benazepril hcl) ..... One daily    Hydrochlorothiazide 12.5 Mg Caps (Hydrochlorothiazide) ..... One daily Her updated medication list for this problem includes:    Amlodipine Besylate 5 Mg Tabs (Amlodipine besylate) ..... One daily    Accuretic 10-12.5 Mg Tabs (Quinapril-hydrochlorothiazide) ..... One tab by mouth once daily  BP today: 120/70 Prior BP: 108/82 (02/15/2010)  Prior 10 Yr Risk Heart Disease: 3 % (01/11/2009)  Labs Reviewed: K+: 4.7 (11/16/2009) Creat: : 0.67 (11/16/2009)   Chol: 255 (11/16/2009)   HDL: 137 (11/16/2009)   LDL: 103 (11/16/2009)   TG: 73 (11/16/2009)  Complete Medication List: 1)  Hydrocodone-acetaminophen 5-500 Mg Tabs (Hydrocodone-acetaminophen) .... One three times a day as needed 2)  Amlodipine Besylate 5 Mg Tabs (Amlodipine besylate) .... One daily 3)  Proair Hfa 108 (90 Base) Mcg/act Aers (Albuterol sulfate) .... Two puffs every 6 hours as needed 4)  Tums  .... As needed 5)  Omeprazole 20 Mg Cpdr (Omeprazole) .Marland Kitchen.. 1 by  mouth 30 minutes before her first meal 6)  Valium 5 Mg Tabs (Diazepam) .... 1/2 tab twice daily as needed 7)  Symbicort 160-4.5 Mcg/act Aero (Budesonide-formoterol fumarate) .... Take 2 puffs two times a day. 8)  Accuretic 10-12.5 Mg Tabs (Quinapril-hydrochlorothiazide) .... One tab by mouth once daily 9)  Doxycycline Hyclate 100 Mg Caps (Doxycycline hyclate) .... Take 1 capsule by mouth two times a day  Patient Instructions: 1)  F/U as before. 2)  Antibiotic is sent in and you are referred to ENT. 3)  Lipid Panel prior to visit, ICD-9:  fasting lipid in January Prescriptions: DOXYCYCLINE HYCLATE 100 MG CAPS (DOXYCYCLINE HYCLATE) Take 1 capsule by mouth two times a day  #20 x 0   Entered and Authorized by:   Tula Nakayama MD   Signed by:   Tula Nakayama MD on 04/05/2010   Method used:   Electronically to        [facility] [phone]* (retail)       [address]       [place], [place]  [address]       Ph: [phone] or [phone]  Fax: [phone]   RxID:   [id number]    Orders Added: 1)  Est. Patient Level IV [08719] 2)  ENT Referral [ENT] 3)  T-Lipid Profile [[id number]]

## 2010-06-06 NOTE — Letter (Signed)
Summary: med review sheet  med review sheet   Imported By: Lenn Cal 02/19/2010 16:31:18  _____________________________________________________________________  External Attachment:    Type:   Image     Comment:   External Document

## 2010-06-06 NOTE — Letter (Signed)
Summary: DISABILITY DETERMINATION  DISABILITY DETERMINATION   Imported By: Hoy Morn 03/09/2010 14:26:53  _____________________________________________________________________  External Attachment:    Type:   Image     Comment:   External Document

## 2010-06-06 NOTE — Letter (Signed)
Summary: MRCP ORDER  MRCP ORDER   Imported By: Stephan Minister 06/07/2009 12:09:35  _____________________________________________________________________  External Attachment:    Type:   Image     Comment:   External Document

## 2010-06-06 NOTE — Progress Notes (Signed)
  Phone Note Call from Patient   Caller: Patient Summary of Call: wants to know can you refill her hydrocodone for headache, just for the time being.  Initial call taken by: Baldomero Lamy LPN,  July 27, 6657 4:59 PM  Follow-up for Phone Call        Tell Amariz, I'm sorry but I can't.  She is getting this from Dr Oneida Alar. Follow-up by: Kennith Gain PA,  July 27, 2009 5:08 PM  Additional Follow-up for Phone Call Additional follow up Details #1::        patient states you wrote a lil higher dose the last when she was in last, can you just refill this once? Additional Follow-up by: Baldomero Lamy LPN,  July 29, 9355 9:32 AM    Additional Follow-up for Phone Call Additional follow up Details #2::    I refilled this time, but this is not something she can be getting from 2 drs. Future refills from Dr Oneida Alar.  Follow-up by: Kennith Gain PA,  July 28, 2009 9:44 AM  Additional Follow-up for Phone Call Additional follow up Details #3:: Details for Additional Follow-up Action Taken: patient aware Additional Follow-up by: Baldomero Lamy LPN,  July 29, 175 9:55 AM  Prescriptions: HYDROCODONE-ACETAMINOPHEN 7.5-500 MG TABS (HYDROCODONE-ACETAMINOPHEN) take 1 q 6 hrs as needed for pain.  Do not take with hydrocodone 5 mg.  #20 x 0   Entered and Authorized by:   Kennith Gain PA   Signed by:   Kennith Gain PA on 07/28/2009   Method used:   Printed then faxed to ...       [address] [phone]* (retail)       [address]       [place], [place]  [address]       Ph: [phone] or [phone]       Fax: [phone]   RxID:   [id number]

## 2010-06-06 NOTE — Letter (Signed)
Summary: Recall Office Visit  [facility]  [address]   [place], [place] [address]   Phone: [phone]  Fax: [phone]      April 04, 2010   LONETTE STEVISON [place], [place]  [address] [birth year]   Dear Ms. Pulido,   According to our records, it is time for you to schedule a follow-up office visit with [place].   At your convenience, please call [phone] to schedule an office visit. If you have any questions, concerns, or feel that this letter is in error, we would appreciate your call.   Sincerely,    [facility] Ph: [phone]   Fax: [phone]

## 2010-06-06 NOTE — Progress Notes (Signed)
Summary: sick  Phone Note Call from Patient   Summary of Call: pt is still not feeling better. wants to know how long sinus infection will last. she is blowing out green and yellow. [phone] Initial call taken by: Lenn Cal,  July 27, 2009 3:48 PM  Follow-up for Phone Call        She should be noticing improvement by now. I have changed her prescription to Doxycycline.  Follow-up by: Kennith Gain PA,  July 27, 2009 4:31 PM  Additional Follow-up for Phone Call Additional follow up Details #1::        patient aware Additional Follow-up by: Baldomero Lamy LPN,  July 27, 3092 4:58 PM    New/Updated Medications: DOXYCYCLINE HYCLATE 100 MG CAPS (DOXYCYCLINE HYCLATE) take 1 two times a day x 10 days Prescriptions: DOXYCYCLINE HYCLATE 100 MG CAPS (DOXYCYCLINE HYCLATE) take 1 two times a day x 10 days  #20 x 0   Entered and Authorized by:   Kennith Gain PA   Signed by:   Kennith Gain PA on 07/27/2009   Method used:   Electronically to        [facility]. [phone]* (retail)       [address]       [place], [place]  [address]       Ph: [phone] or [phone]       Fax: [phone]   RxID:   [id number]

## 2010-06-06 NOTE — Progress Notes (Signed)
Summary: CALL  Phone Note Call from Patient   Summary of Call: WOULD LIKE TO SPEAK TO [name] [name] CALL BACK AT [facility] Initial call taken by: Dierdre Harness,  November 28, 2009 2:28 PM  Follow-up for Phone Call        returned call, no answer Follow-up by: Baldomero Lamy LPN,  November 28, 6120 4:49 PM  Additional Follow-up for Phone Call Additional follow up Details #1::        applying for disability and medicaid needs a statement saying is disabled and unable to work due to her chrons disease Additional Follow-up by: Baldomero Lamy LPN,  November 28, 7528 0:51 PM    Additional Follow-up for Phone Call Additional follow up Details #2::    Dr Oneida Alar manages her Chron's disease.  She would need to get this from her.   Follow-up by: Kennith Gain PA,  November 28, 2009 5:35 PM  Additional Follow-up for Phone Call Additional follow up Details #3:: Details for Additional Follow-up Action Taken: returned call, no answer Additional Follow-up by: Baldomero Lamy LPN,  November 30, 1019 1:17 AM  patient aware Baldomero Lamy LPN  November 29, 3565 0:14 PM

## 2010-06-06 NOTE — Miscellaneous (Signed)
Summary: Orders Update  Clinical Lists Changes  Orders: Added new Test order of T-Ceruloplasmin [phone]) - Signed Added new Test order of T-Alpha-1-Antitrypsin Tot [phone]) - Signed Added new Test order of T-AMA [phone]) - Signed

## 2010-06-06 NOTE — Letter (Signed)
Summary: [place]   Imported By: Dierdre Harness 01/19/2010 13:38:11  _____________________________________________________________________  External Attachment:    Type:   Image     Comment:   External Document

## 2010-06-06 NOTE — Progress Notes (Signed)
Summary: rxs  Phone Note Call from Patient   Summary of Call: her rxs are not at [facility] please send over left message Initial call taken by: Dierdre Harness,  July 21, 2009 1:04 PM  Follow-up for Phone Call        Rx Called In Follow-up by: Baldomero Lamy LPN,  July 22, 1386 1:09 PM

## 2010-06-06 NOTE — Letter (Signed)
Summary: RX ASSISTANCE  RX ASSISTANCE   Imported By: Dierdre Harness 02/15/2010 14:58:51  _____________________________________________________________________  External Attachment:    Type:   Image     Comment:   External Document

## 2010-06-06 NOTE — Progress Notes (Signed)
Summary: phone note/ when does she do LFT'S again  Phone Note Call from Patient   Caller: Patient Summary of Call: Pt would like to know when she needs to get LFT's done again. She is aware Dr. Oneida Alar is on vacation and it will be next week before we call her back. Initial call taken by: Waldon Merl LPN,  Sep 21, 3752 2:37 PM     Appended Document: phone note/ when does she do LFT'S again Labs July 2011.

## 2010-06-06 NOTE — Letter (Signed)
Summary: Recall Office Visit  [facility]  [address]   [place], [place] [address]   Phone: [phone]  Fax: [phone]      November 15, 2009   [name] [name] [place], [place]  [address] 04/05/1956   Dear Ms. Dhingra,   According to our records, it is time for you to schedule a follow-up office visit with [place].   At your convenience, please call [phone] to schedule an office visit. If you have any questions, concerns, or feel that this letter is in error, we would appreciate your call.   Sincerely,    Burnadette Peter LPN  [facility] Ph: [phone]   Fax: [phone]

## 2010-06-06 NOTE — Letter (Signed)
Summary: Recall, Labs Needed  [facility]  [address]   [place], [place] [address]   Phone: [phone]  Fax: [phone]    Sep 22, 2009  TARRI GUILFOIL [place], [place]  [address] 04-14-1956   Dear Ms. Cruzan,   Our records indicate it is time to repeat your blood work.  You can take the enclosed form to the lab on or near the date indicated.  Please make note of the new location of the lab:   [facility], 2nd floor   [place] office will call you within a week to ten business days with the results.  If you do not hear from [place] in 10 business days, you should call the office.  If you have any questions regarding this, call the office at [phone], and ask for the nurse.  Labs are due on 10/18/2009.   Sincerely,    Burnadette Peter LPN  [facility] Ph: [phone]   Fax: [phone]

## 2010-06-06 NOTE — Letter (Signed)
Summary: rx assistance  rx assistance   Imported By: Dierdre Harness 02/28/2010 12:47:52  _____________________________________________________________________  External Attachment:    Type:   Image     Comment:   External Document

## 2010-06-06 NOTE — Progress Notes (Signed)
Summary: nurse  Phone Note Call from Patient   Summary of Call: please give pt a call [phone] Initial call taken by: Lenn Cal,  December 12, 2009 3:00 PM  Follow-up for Phone Call        patient wants to talk concerning disability/medicaid hearing- wants statement saying she is unable to work due to chrones diseased, has been advised by Arrie Aran to contact her GI doctor reguarding, they are treating her chrons disease. GI docs office called [place] today stating that patient was upset because they made they would not provide statement due to the fact they do not feel she is disabled due to her condition. patient calls back to our office wanting a statement again from dawn and states GI doctor advised her to get it from [place]. We advised patient we cannot provide statement of disability but can give her a statement of her conditions only Follow-up by: Baldomero Lamy LPN,  December 12, 3200 3:39 PM  Additional Follow-up for Phone Call Additional follow up Details #1::        Pt has a hearing next week for Medicaid, and an upcoming hearing for SSI disability.  Advised pt that SSI will determine if she qualifies for disability, that that is not determined by myself or Dr Oneida Alar.  That SSI disability will go by her medical records.  Advised pt that I would be willing to write a letter stating her chronic medical conditions.  Pt asked me to include that she is unable to work a 40 hr work week because of her Chron's.  I again advised pt that I cannot say she is unable to work due to [employer], that she is under the care of Dr Oneida Alar for this, who has already told her that she doesnt feel that she is unable to work.  I can put in the letter that she (the pt) feels that she is unable to work.  Pt was agreeable to this.  Advised pt she can pick up the letter tomorrow afternoon. Additional Follow-up by: Kennith Gain PA,  December 12, 2009 4:08 PM

## 2010-06-06 NOTE — Progress Notes (Signed)
Summary: new rx  Phone Note Call from Patient   Summary of Call: [name] [name] needs a r x for her colestid 5 mg  akes 2 x a day fax # [phone] Initial call taken by: Dierdre Harness,  April 11, 2010 2:09 PM  Follow-up for Phone Call        was taking cholestyramine but cant get that through health dept needs script for colestid 16m two times a day [name] [name] told her to call here Follow-up by: JBaldomero LamyLPN,  December  7, 244615:08 PM  Additional Follow-up for Phone Call Additional follow up Details #1::        rs written pls fax Additional Follow-up by: MTula NakayamaMD,  April 12, 2010 4:43 PM    Additional Follow-up for Phone Call Additional follow up Details #2::    rx sent Follow-up by: JBaldomero LamyLPN,  December  9, 290122:30 PM

## 2010-06-06 NOTE — Progress Notes (Signed)
  Phone Note From Pharmacy   Caller: [place]. [phone]* Summary of Call: requesting refill on diazepam  ok to refill?? Initial call taken by: Baldomero Lamy LPN,  October 11, 3223 6:72 PM  Follow-up for Phone Call        Rx printed to fax. Follow-up by: Kennith Gain PA,  October 10, 2009 3:00 PM    Prescriptions: VALIUM 5 MG TABS (DIAZEPAM) 1/2 tablet twice daily *MAY FILL ON OR AFTER Sep 09, 2009.*  #30 x 2   Entered and Authorized by:   Kennith Gain PA   Signed by:   Kennith Gain PA on 10/10/2009   Method used:   Printed then faxed to ...       [address] [phone]* (retail)       [address]       [place], [place]  [address]       Ph: [phone] or [phone]       Fax: [phone]   RxID:   [id number]

## 2010-06-06 NOTE — Letter (Signed)
Summary: [facility], general practice at [place]. [address] [place] [address]   Phone: [phone]  Fax: [phone]      December 06, 2009 MRN: [id number]   [name] [name] [address] [place], [place]  [address]   Dear [name],  Your test ordered by Rande Lawman has been reviewed by your physician (or physician assistant) and was found to be normal or stable. Your physician (or physician assistant) felt no changes were needed at this time.  ____ Echocardiogram  __X__ Cardiac Stress Test  ____ Lab Work  ____ Peripheral vascular study of arms, legs or neck  ____ CT scan or X-ray  ____ Lung or Breathing test  ____ Other: Please continue on current medical treatment.  Thank you.   Rozann Lesches, MD, F.A.C.C

## 2010-06-06 NOTE — Progress Notes (Signed)
Summary: referral to [facility]  If your office will make this referral, that would be great.  It probably makes more sense for the PCP office to make the referral so all other specialty inputs could be sent together.  Thanks, Mandy  ---- Converted from flag ---- ---- 12/22/2009 10:20 AM, Baldomero Lamy LPN wrote: [name] [name], dr fields mentioned a mental health referal in her last note on this patient.  is your office doing this or would she like [place] to make the referal? thanks- jaime ------------------------------  Appended Document: referral to [facility] refer pt to [employer] [facility]. Dx:  Anxiety, and Chronic abd pain  Appended Document: referral to [employer] sent information to behavior health and ruby will call pt back with appt and time. my take alittle bit.

## 2010-06-06 NOTE — Letter (Signed)
Summary: DISABILITY CLAIM  DISABILITY CLAIM   Imported By: Zeb Comfort 06/05/2009 14:19:25  _____________________________________________________________________  External Attachment:    Type:   Image     Comment:   External Document

## 2010-06-06 NOTE — Assessment & Plan Note (Signed)
Summary: Crohn's dz, diarrhea, elevated liver enzymes   Visit Type:  Follow-up Visit Primary Care Provider:  Claybon Jabs, [place]  Chief Complaint:  Crohn's, Diarrhea, and liver enzymes .  History of Present Illness: Bowels: sometimes good and sometimes bad. Taking Questran 1-2 times a day because it's so expensive. Liver enzymes normal so no liver biopsy. Polyp in nose. Appetite: comes and goes. Blackberries caused cramping and wretching. Gained 4 lbs since [facility] 2011.  Current Medications (verified): 1)  Hydrocodone-Acetaminophen 5-500 Mg Tabs (Hydrocodone-Acetaminophen) .... One Three Times A Day As Needed 2)  Amlodipine Besylate 5 Mg  Tabs (Amlodipine Besylate) .... One Daily 3)  Proair Hfa 108 (90 Base) Mcg/act  Aers (Albuterol Sulfate) .... Two Puffs Every 6 Hours As Needed 4)  Benazepril Hcl 10 Mg  Tabs (Benazepril Hcl) .... One Daily 5)  Hydrochlorothiazide 12.5 Mg  Caps (Hydrochlorothiazide) .... One Daily 6)  Cholestyramine 4 Gm/dose Powd (Cholestyramine) .... 4 Gram One -Two Times Daily.  Do Not Take Within 2 Hours of Other Meds.  Hold For Constipation. 7)  Tums .... As Needed 8)  Omeprazole 20 Mg Cpdr (Omeprazole) .Marland Kitchen.. 1 By Mouth 30 Minutes Before Her First Meal 9)  Valium 5 Mg Tabs (Diazepam) .... 1/2 Tab Twice Daily As Needed 10)  Symbicort 160-4.5 Mcg/act Aero (Budesonide-Formoterol Fumarate) .... Take 2 Puffs Bid  Allergies (verified): No Known Drug Allergies  Past History:  Past Medical History: 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 JUNE 2011 **USED ETOH IN 2009 **2009: ANA, ASMA  TCS  JAN 2009-Anastamotic ulcer 2O TO ISCHEMIA, no microscopic colitis Esophageal Stricture and gastritis-EGD/dilation JAN 2009 Diarrhea multifactorial: lactose intolerance, bile-salt, IBS, SBBO (2008) SB Crohn's disease-R hemicolectomy MAR 2006 Anxiety HTN Chronic abdominal and back pain NECK PAIN, LEFT (ICD-723.1) WRIST PAIN, LEFT (ICD-719.43) HYPERLIPIDEMIA (ICD-272.4) VARCIES, SITE  NEC (ICD-456.8) LUMP OR MASS IN BREAST (ICD-611.72) TOBACCO ABUSE (ICD-305.1) CARPAL TUNNEL SYNDROME (ICD-354.0) MIGRAINE HEADACHE (ICD-346.90) ASTHMA, WITH ACUTE EXACERBATION (ICD-493.92) ALLERGIC RHINITIS (ICD-477.9)  Vital Signs:  Patient profile:   55 year old female Menstrual status:  postmenopausal Height:      60 inches Weight:      117 pounds BMI:     22.93 Temp:     98.6 degrees F oral Pulse rate:   72 / minute BP sitting:   108 / 82  (left arm) Cuff size:   regular  Vitals Entered By: Burnadette Peter LPN (November 21, 6657 9:35 AM)  Physical Exam  General:  Well developed, well nourished, no acute distress. Head:  Normocephalic and atraumatic. Lungs:  Clear throughout to auscultation. Heart:  Regular rate and rhythm; no murmurs. Abdomen:  Soft, nontender and nondistended. Normal bowel sounds. Extremities:  No edema noted. Neurologic:  Alert and  oriented x4;  grossly normal neurologically. Skin:  no pretibial lesions  Impression & Recommendations:  Problem # 1:  CROHN'S DISEASE (ICD-555.9) Assessment Unchanged Surgical remission.  Problem # 2:  DIARRHEA, RECURRENT (ICD-787.91) Assessment: Unchanged Multiple etiologies-Continue with abd pain and intermittent diarrhea. Quetsran BID. Levsin as needed. OPV in 4 mos. Vicodin refilled until [place] 2011.  CC: PCP Prescriptions: LEVSIN/SL 0.125 MG SUBL (HYOSCYAMINE SULFATE) 1-2 sl q4-6h as needed abd pain and cramping  #30 x 5   Entered and Authorized by:   Danie Binder MD   Signed by:   Danie Binder MD on 11/21/2009   Method used:   Electronically to        [facility]. [phone]* (retail)  [address]       [place], [place]  [address]       Ph: [phone] or [phone]       Fax: [phone]   RxID:   [id number]   Appended Document: Orders Update    Clinical Lists Changes  Orders: Added new Service order of Est. Patient Level III (76808) - Signed      Appended  Document: Crohn's dz, diarrhea, elevated liver enzymes REMINDER IN COMPUTER

## 2010-06-06 NOTE — Assessment & Plan Note (Signed)
Summary: follow up - room 3   Vital Signs:  Patient profile:   55 year old female Menstrual status:  postmenopausal Height:      60 inches Weight:      116.75 pounds BMI:     22.88 O2 Sat:      98 % on Room air Pulse rate:   95 / minute Resp:     16 per minute BP sitting:   100 / 60  (left arm)  Vitals Entered By: Baldomero Lamy LPN (August 04, 1777 39:03 AM) CC: follow up Is Patient Diabetic? No Pain Assessment Patient in pain? no        Primary Provider:  Kennith Gain PA  CC:  follow up.  History of Present Illness: Pt is here today for follow up. States her sinus infection is getting better.  Still has some crackling in her ears off & on.  And still getting some green mucus from nose. She is on her 2nd oral antibiotics. No fever or chills.  Needs another prescription refill for Valium to fill in May.  [place] at the time that she needs to fill this.  Leaving to visit her son in [place] x 1 mos.  She is very excieted because she hasn't seen him in 5 yrs.  IBS still bothering her.  See's Dr Oneida Alar for this.      Current Medications (verified): 1)  Hydrocodone-Acetaminophen 5-500 Mg Tabs (Hydrocodone-Acetaminophen) .... One Three Times A Day As Needed 2)  Amlodipine Besylate 5 Mg  Tabs (Amlodipine Besylate) .... One Daily 3)  Proair Hfa 108 (90 Base) Mcg/act  Aers (Albuterol Sulfate) .... Two Puffs Every 6 Hours As Needed 4)  Benazepril Hcl 10 Mg  Tabs (Benazepril Hcl) .... One Daily 5)  Hydrochlorothiazide 12.5 Mg  Caps (Hydrochlorothiazide) .... One Daily 6)  Cholestyramine 4 Gm/dose Powd (Cholestyramine) .... 4 Gram One -Two Times Daily.  Do Not Take Within 2 Hours of Other Meds.  Hold For Constipation. 7)  Valium 5 Mg Tabs (Diazepam) .... 1/2 Tablet Twice Daily 8)  Advair Diskus 100-50 Mcg/dose Aepb (Fluticasone-Salmeterol) .... Two Times A Day 9)  Tums .... As Needed 10)  Omeprazole 20 Mg Cpdr (Omeprazole) .Marland Kitchen.. 1 By Mouth 30 Minutes Before Her First  Meal 11)  Valium 5 Mg Tabs (Diazepam) .... 1/2 Tab Twice Daily As Needed 12)  Hydrocodone-Acetaminophen 7.5-500 Mg Tabs (Hydrocodone-Acetaminophen) .... Take 1 Q 6 Hrs As Needed For Pain.  Do Not Take With Hydrocodone 5 Mg. 13)  Doxycycline Hyclate 100 Mg Caps (Doxycycline Hyclate) .... Take 1 Two Times A Day X 10 Days  Allergies (verified): No Known Drug Allergies  Past History:  Past medical history reviewed for relevance to current acute and chronic problems.  Past Medical History: Reviewed history from 07/19/2009 and no changes required. Mixed pattern liver enzyme elevation: **USED ETOH IN 2009 **2009: ANA, ASMA  TCS  JAN 2009-Anastamotic ulcer 2O TO ISCHEMIA, no microscopic colitis Esophageal Stricture and gastritis-EGD/dilation JAN 2009 Diarrhea multifactorial: lactose intolerance, bile-salt, IBS, SBBO (2008) SB Crohn's disease-R hemicolectomy MAR 2006 Anxiety HTN Chronic abdominal and back pain NECK PAIN, LEFT (ICD-723.1) WRIST PAIN, LEFT (ICD-719.43) HYPERLIPIDEMIA (ICD-272.4) VARCIES, SITE NEC (ICD-456.8) LUMP OR MASS IN BREAST (ICD-611.72) TOBACCO ABUSE (ICD-305.1) CARPAL TUNNEL SYNDROME (ICD-354.0) MIGRAINE HEADACHE (ICD-346.90) ASTHMA, WITH ACUTE EXACERBATION (ICD-493.92) ALLERGIC RHINITIS (ICD-477.9)  Review of Systems General:  Denies chills and fever. ENT:  Complains of nasal congestion and postnasal drainage; denies earache, sinus pressure, and  sore throat. CV:  Denies chest pain or discomfort. Resp:  Complains of cough; denies sputum productive. GI:  Denies diarrhea, nausea, and vomiting.  Physical Exam  General:  Well-developed,well-nourished,in no acute distress; alert,appropriate and cooperative throughout examination Head:  Normocephalic and atraumatic without obvious abnormalities. No apparent alopecia or balding. Ears:  External ear exam shows no significant lesions or deformities.  Otoscopic examination reveals clear canals, tympanic membranes are  intact bilaterally without bulging, retraction, inflammation or discharge. Hearing is grossly normal bilaterally. Nose:  no external deformity and mucosal erythema and yellow mucus noted bilat nares.   Mouth:  Oral mucosa and oropharynx without lesions or exudates.   Neck:  No deformities, masses, or tenderness noted. Lungs:  Normal respiratory effort, chest expands symmetrically. Lungs are clear to auscultation, no crackles or wheezes. Heart:  Normal rate and regular rhythm. S1 and S2 normal without gallop, murmur, click, rub or other extra sounds. Cervical Nodes:  No lymphadenopathy noted Psych:  Cognition and judgment appear intact. Alert and cooperative with normal attention span and concentration. No apparent delusions, illusions, hallucinations   Impression & Recommendations:  Problem # 1:  SINUSITIS, ACUTE (ICD-461.9) Assessment Unchanged  Her updated medication list for this problem includes:    Doxycycline Hyclate 100 Mg Caps (Doxycycline hyclate) .Marland Kitchen... Take 1 two times a day x 10 days  Orders: Rocephin  265m ((P7106 Admin of Therapeutic Inj  intramuscular or subcutaneous ((26948  Problem # 2:  ANXIETY (ICD-300.00)  Her updated medication list for this problem includes:    Valium 5 Mg Tabs (Diazepam) ..Marland Kitchen.. 1/2 tablet twice daily *may fill on or after Sep 09, 2009.*    Valium 5 Mg Tabs (Diazepam) ..Marland Kitchen.. 1/2 tab twice daily as needed  Problem # 3:  ASTHMA, MILD (ICD-493.90) Assessment: Unchanged Pt given information for pt assistance for Symbicort.  The following medications were removed from the medication list:    Advair Diskus 100-50 Mcg/dose Aepb (Fluticasone-salmeterol) ..Marland Kitchen..Marland KitchenTwo times a day Her updated medication list for this problem includes:    Proair Hfa 108 (90 Base) Mcg/act Aers (Albuterol sulfate) ..Marland Kitchen..Marland KitchenTwo puffs every 6 hours as needed    Symbicort 160-4.5 Mcg/act Aero (Budesonide-formoterol fumarate) ..Marland Kitchen.. Take 2 puffs bid  Problem # 4:  COPD  (ICD-496) Assessment: Unchanged  The following medications were removed from the medication list:    Advair Diskus 100-50 Mcg/dose Aepb (Fluticasone-salmeterol) ..Marland Kitchen..Marland KitchenTwo times a day Her updated medication list for this problem includes:    Proair Hfa 108 (90 Base) Mcg/act Aers (Albuterol sulfate) ..Marland Kitchen..Marland KitchenTwo puffs every 6 hours as needed    Symbicort 160-4.5 Mcg/act Aero (Budesonide-formoterol fumarate) ..Marland Kitchen.. Take 2 puffs bid  Complete Medication List: 1)  Hydrocodone-acetaminophen 5-500 Mg Tabs (Hydrocodone-acetaminophen) .... One three times a day as needed 2)  Amlodipine Besylate 5 Mg Tabs (Amlodipine besylate) .... One daily 3)  Proair Hfa 108 (90 Base) Mcg/act Aers (Albuterol sulfate) .... Two puffs every 6 hours as needed 4)  Benazepril Hcl 10 Mg Tabs (Benazepril hcl) .... One daily 5)  Hydrochlorothiazide 12.5 Mg Caps (Hydrochlorothiazide) .... One daily 6)  Cholestyramine 4 Gm/dose Powd (Cholestyramine) .... 4 gram one -two times daily.  do not take within 2 hours of other meds.  hold for constipation. 7)  Valium 5 Mg Tabs (Diazepam) .... 1/2 tablet twice daily *may fill on or after Sep 09, 2009.* 8)  Tums  .... As needed 9)  Omeprazole 20 Mg Cpdr (Omeprazole) ..Marland Kitchen. 1 by mouth 30 minutes before her first  meal 10)  Valium 5 Mg Tabs (Diazepam) .... 1/2 tab twice daily as needed 11)  Hydrocodone-acetaminophen 7.5-500 Mg Tabs (Hydrocodone-acetaminophen) .... Take 1 q 6 hrs as needed for pain.  do not take with hydrocodone 5 mg. 12)  Doxycycline Hyclate 100 Mg Caps (Doxycycline hyclate) .... Take 1 two times a day x 10 days 13)  Symbicort 160-4.5 Mcg/act Aero (Budesonide-formoterol fumarate) .... Take 2 puffs bid  Patient Instructions: 1)  Please schedule a follow-up appointment in 3 months. 2)  Tobacco is very bad for your health and your loved ones! You Should stop smoking!. 3)  Stop Smoking Tips: Choose a Quit date. Cut down before the Quit date. decide what you will do as a substitute  when you feel the urge to smoke(gum,toothpick,exercise). 4)  Complete the antibiotics as prescribed. 5)  You have received a shot of Rocephin today.  This is an antibiotic to help with your sinus infection. 6)  I have also prescribed Symbicort for you.  Use 2 puffs twice a day.  This is for your asthma & COPD, and should help improve your breathing. Prescriptions: VALIUM 5 MG TABS (DIAZEPAM) 1/2 tablet twice daily *MAY FILL ON OR AFTER Sep 09, 2009.*  #30 x 0   Entered by:   Kate Sable LPN   Authorized by:   Kennith Gain PA   Signed by:   Kate Sable LPN on 54/01/8118   Method used:   Print then Give to Patient   RxID:   [id number] SYMBICORT 160-4.5 MCG/ACT AERO (BUDESONIDE-FORMOTEROL FUMARATE) take 2 puffs bid  #1 x 3   Entered by:   Kate Sable LPN   Authorized by:   Kennith Gain PA   Signed by:   Kate Sable LPN on 84/69/6295   Method used:   Print then Give to Patient   RxID:   [id number] VALIUM 5 MG TABS (DIAZEPAM) 1/2 tablet twice daily *MAY FILL ON OR AFTER Sep 09, 2009.*  #30 x 0   Entered and Authorized by:   Kennith Gain PA   Signed by:   Kennith Gain PA on 08/04/2009   Method used:   Print then Give to Patient   RxID:   [id number] Aten 160-4.5 MCG/ACT AERO (BUDESONIDE-FORMOTEROL FUMARATE) take 2 puffs bid  #1 x 3   Entered and Authorized by:   Kennith Gain PA   Signed by:   Kennith Gain PA on 08/04/2009   Method used:   Electronically to        [facility]. [phone]* (retail)       [address]       [place], [place]  [address]       Ph: [phone] or [phone]       Fax: [phone]   RxID:   [id number]    Medication Administration  Injection # 1:    Medication: Rocephin  277m    Diagnosis: SINUSITIS, ACUTE (ICD-461.9)    Route: IM    Site: RUOQ gluteus    Exp Date: 12/2010    Lot #: [id number]   Mfr: novaplus    Comments: 1001mgiven     Patient tolerated injection without complications    Given by: BrKate SableLPN (April  1, 201062269:48M)  Orders Added: 1)  Rocephin  25077mJ0696] 2)  Admin of Therapeutic Inj  intramuscular or subcutaneous [96372] 3)  Est. Patient Level IV [99[54627]

## 2010-06-06 NOTE — Assessment & Plan Note (Signed)
Summary: NP3 CHEST PAIN   Visit Type:  Initial Consult Primary Provider:  Kennith Gain PA  CC:  sob chest pain.  History of Present Illness: [name] [name] is seen today at the request of Sander Radon.  She has HTN, and is a smoker with an abnormla ECG and SSCP.  Her pain is atypical and typically related to flairs of chrones.  She gets abominal pain that progressed into her chest.  She has exertional dyspnea that is likley related to her smoking.  I counseled her for less than 10 minutes.  She has cut back to a couple/day and has a quit date of 8/1.  She does not want Welbutrin or referral to class.  She has not had recentl echo or ETT.  Her ECG is abnormal with low voltage ICRBBB and poor R wave progression.   Current Problems (verified): 1)  Shortness of Breath  (ICD-786.05) 2)  Nasal Polyp  (ICD-471.0) 3)  Chest Pain  (ICD-786.50) 4)  COPD  (ICD-496) 5)  Medial Epicondylitis, Right  (ICD-726.31) 6)  Sinusitis, Acute  (ICD-461.9) 7)  Knee Pain  (ICD-719.46) 8)  Diarrhea  (ICD-787.91) 9)  Ruq Pain  (ICD-789.01) 10)  Fracture, Rib, Right  (ICD-807.00) 11)  Esophageal Stricture  (ICD-530.3) 12)  Dysphagia Unspecified  (ICD-787.20) 13)  Hoarseness  (ICD-784.49) 14)  Osteoporosis  (ICD-733.00) 15)  Back Pain, Lumbar, With Radiculopathy  (ICD-724.4) 16)  Macrocytic Anemia  (ICD-281.9) 17)  Transaminases, Serum, Elevated  (ICD-790.4) 18)  Genital Herpes  (ICD-054.10) 19)  Degenerative Disc Disease, Cervical Spine  (ICD-722.4) 20)  Hyperlipidemia  (ICD-272.4) 21)  Varcies, Site Nec  (ICD-456.8) 22)  Tobacco Abuse  (ICD-305.1) 23)  Crohn's Disease  (ICD-555.9) 24)  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 One Three Times A Day As Needed 2)  Amlodipine Besylate 5 Mg  Tabs (Amlodipine Besylate) .... One Daily 3)  Proair Hfa 108 (90 Base) Mcg/act  Aers (Albuterol Sulfate) .... Two Puffs Every 6 Hours As Needed 4)  Benazepril Hcl 10 Mg  Tabs (Benazepril Hcl) .... One Daily 5)  Hydrochlorothiazide 12.5 Mg  Caps (Hydrochlorothiazide) .... One Daily 6)  Cholestyramine 4 Gm/dose Powd (Cholestyramine) .... 4 Gram One -Two Times Daily.  Do Not Take Within 2 Hours of Other Meds.  Hold For Constipation. 7)  Tums .... As Needed 8)  Omeprazole 20 Mg Cpdr (Omeprazole) .Marland Kitchen.. 1 By Mouth 30 Minutes Before Her First Meal 9)  Valium 5 Mg Tabs (Diazepam) .... 1/2 Tab Twice Daily As Needed 10)  Hydrocodone-Acetaminophen 7.5-500 Mg Tabs (Hydrocodone-Acetaminophen) .... Take 1 Q 6 Hrs As Needed For Pain.  Do Not Take With Hydrocodone 5 Mg. 11)  Symbicort 160-4.5 Mcg/act Aero (Budesonide-Formoterol Fumarate) .... Take 2 Puffs Bid 12)  Fluticasone Propionate 50 Mcg/act Susp (Fluticasone Propionate) .... Use 2 Sprays Each Nostril Once Daily  Allergies (verified): No Known Drug Allergies  Past History:  Past Medical History: Last updated: 07/19/2009 Mixed pattern liver enzyme elevation: **USED ETOH IN 2009 **2009: ANA, ASMA  TCS  JAN 2009-Anastamotic ulcer 2O TO ISCHEMIA, no microscopic colitis Esophageal Stricture and gastritis-EGD/dilation JAN 2009 Diarrhea multifactorial: lactose intolerance, bile-salt, IBS, SBBO (2008) SB Crohn's disease-R hemicolectomy MAR 2006 Anxiety HTN Chronic abdominal and  back pain NECK PAIN, LEFT (ICD-723.1) WRIST PAIN, LEFT (ICD-719.43) HYPERLIPIDEMIA (ICD-272.4) VARCIES, SITE NEC (ICD-456.8) LUMP OR MASS IN BREAST (ICD-611.72) TOBACCO ABUSE (ICD-305.1) CARPAL TUNNEL SYNDROME (ICD-354.0) MIGRAINE HEADACHE (ICD-346.90) ASTHMA, WITH ACUTE EXACERBATION (ICD-493.92) ALLERGIC RHINITIS (ICD-477.9)  Past Surgical History: Last updated:  10/18/2008 1. hernia removal - old abd scar 2. broken jaw repair 3. Colectomy due to Chron's 4. Tubal Pregnancy with tube removals 5. CTS left wrist 6. Left L5-S1 diskectomy using microdissection - April 2010 - Dr. Arnoldo Morale  Family History: Last updated: 10/18/2008 Father: 27 CAD with hx MI at age 86 Mother: 56 Hypothyroidism and Hx related to bleeding Siblings: Brothers - 41 - Healthy Sister - 42 HTN, 28 Chrons - severe Kids - boy age 87 healthy and girl age 33 - vertigo  Social History: Last updated: 06/12/2009 Occupation: [employer] off 2008.  Has begun disability application.  Single-long term relationship Current Smoker Alcohol use-no Drug use-no Lives with boyfriend Education: 11th grade  Review of Systems       Denies fever, malais, weight loss, blurry vision, decreased visual acuity, cough, sputum, hemoptysis, pleuritic pain, palpitaitons, heartburn, abdominal pain, melena, lower extremity edema, claudication, or rash.   Vital Signs:  Patient profile:   55 year old female Menstrual status:  postmenopausal Weight:      116 pounds Pulse rate:   81 / minute BP sitting:   121 / 80  (right arm)  Vitals Entered By: Doretha Sou, CNA (November 16, 2009 10:03 AM)  Physical Exam  General:  Affect appropriate Healthy:  appears stated age 55: normal Neck supple with no adenopathy JVP normal no bruits no thyromegaly Lungs clear with no wheezing and good diaphragmatic motion Heart:  S1/S2 no murmur,rub, gallop or click PMI normal Abdomen: benighn, BS positve, no tenderness, no AAA no bruit.  No HSM or HJR Distal pulses intact with no bruits No edema Neuro non-focal Skin warm and dry    Impression & Recommendations:  Problem # 1:  SHORTNESS OF BREATH (ICD-786.05) Likely related to smoking F/U primary for CXR surveilance  Check echo for RV/LV function and R/O  pulmonary HTN Her updated medication list for this problem includes:     Amlodipine Besylate 5 Mg Tabs (Amlodipine besylate) ..... One daily    Benazepril Hcl 10 Mg Tabs (Benazepril hcl) ..... One daily    Hydrochlorothiazide 12.5 Mg Caps (Hydrochlorothiazide) ..... One daily  Orders: 2-D Echocardiogram (2D Echo)  Problem # 2:  CHEST PAIN (ICD-786.50) Atypical but with abnormal ECG and CRF's will do stress myovue Her updated medication list for this problem includes:    Amlodipine Besylate 5 Mg Tabs (Amlodipine besylate) ..... One daily    Benazepril Hcl 10 Mg Tabs (Benazepril hcl) ..... One daily  Orders: Nuclear Stress Test (Nuc Stress Test)  Problem # 3:  TOBACCO ABUSE (ICD-305.1) Quit date 8! counseled   Problem # 4:  HYPERLIPIDEMIA (ICD-272.4) At goal with no side effecgts Her updated medication list for this problem includes:    Cholestyramine 4 Gm/dose Powd (Cholestyramine) .Marland KitchenMarland KitchenMarland KitchenMarland Kitchen 4 gram one -two times daily.  do not take within 2 hours of other meds.  hold for constipation.  CHOL: 308 (12/22/2008)   LDL: 98 (12/22/2008)   HDL: 191 (12/22/2008)   TG: 93 (12/22/2008) CHOL (goal): 200 (02/03/2007)   LDL (goal): 160 (05/04/2007)   HDL (goal): 40 (02/03/2007)   TG (goal): 150 (02/03/2007)  Problem # 5:  HYPERTENSION (ICD-401.9) Well controlled Her updated medication list for  this problem includes:    Amlodipine Besylate 5 Mg Tabs (Amlodipine besylate) ..... One daily    Benazepril Hcl 10 Mg Tabs (Benazepril hcl) ..... One daily    Hydrochlorothiazide 12.5 Mg Caps (Hydrochlorothiazide) ..... One daily  Patient Instructions: 1)  Your physician recommends that you schedule a follow-up appointment in: as needed (pending test results) 2)  Your physician recommends that you continue on your current medications as directed. Please refer to the Current Medication list given to you today. 3)  Your physician has requested that you have an echocardiogram.  Echocardiography is a painless test that uses sound waves to create images of your heart. It provides  your doctor with information about the size and shape of your heart and how well your heart's chambers and valves are working.  This procedure takes approximately one hour. There are no restrictions for this procedure. 4)  Your physician has requested that you have an exercise stress myoview.  For further information please visit [email or web address].  Please follow instruction sheet, as given.  Prevention & Chronic Care Immunizations   Influenza vaccine: Fluvax MCR  (02/18/2008)   Influenza vaccine due: 02/2009    Tetanus booster: 06/12/2009: Tdap    Pneumococcal vaccine: given  (02/18/2006)   Pneumococcal vaccine due: 03/2013  Colorectal Screening   Hemoccult: Not documented    Colonoscopy: Miroscopic colitis, int hemorhoids  (05/15/2007)   Colonoscopy due: 05/2015  Other Screening   Pap smear: normal  (11/26/2007)   Pap smear due: 12/2009    Mammogram: ASSESSMENT: Negative - BI-RADS 1^MM DIGITAL TRUST SCREEN BILAT  (11/01/2008)   Mammogram due: 11/2009   Smoking status: current  (08/01/2008)   Smoking cessation counseling: yes  (11/14/2009)    Screening comments: 3 cigs daily  Lipids   Total Cholesterol: 308  (12/22/2008)   LDL: 98  (12/22/2008)   LDL Direct: Not documented   HDL: 191  (12/22/2008)   Triglycerides: 93  (12/22/2008)    SGOT (AST): 25  (11/07/2009)   SGPT (ALT): 17  (11/07/2009)   Alkaline phosphatase: 109  (11/07/2009)   Total bilirubin: 0.7  (11/07/2009)  Hypertension   Last Blood Pressure: 121 / 80  (11/16/2009)   Serum creatinine: 0.58  (12/22/2008)   Serum potassium 4.2  (12/22/2008)  Self-Management Support :    Hypertension self-management support: Not documented    Lipid self-management support: Not documented    EKG Report  Procedure date:  11/16/2009  Findings:      NSR 74 Low voltage ICRBBB ? anterior MI Abnormal ECG

## 2010-06-06 NOTE — Assessment & Plan Note (Signed)
Summary: follow up- room 3   Vital Signs:  Patient profile:   55 year old female Menstrual status:  postmenopausal Height:      60 inches Weight:      117.50 pounds BMI:     23.03 O2 Sat:      97 % on Room air Pulse rate:   81 / minute Resp:     16 per minute BP sitting:   120 / 84  (left arm)  Vitals Entered By: Baldomero Lamy LPN (November 15, 1538 08:67 AM) CC: follow-up visit Is Patient Diabetic? No Comments did not bring meds to ov   Primary Provider:  Kennith Gain PA  CC:  follow-up visit.  History of Present Illness: Pt is here today for check up.  She states she had a very nice month long visit to [place] with her family.  Hx of COPD.  Has noticed some increased difficulty breathing.  Especially going outside in the heat and humidity recently.  She tries to stay in the air conditioning,and does her gardening in the evenings when it's less hot and humid.  When reviewing her meds, pt states she has been using her Symbicort only once daily.  Hx of HTN.  Taking her meds as prescribed and tolerating well, no side effects.  She does admit that she has been getting chest pressure with exertion.  She contributed this to her GI problems.  No radiation and no diaphoresis.  She has not had a cardiac eval previously.  Pt is seeing GI regarding Chron's and GERD.  Had an episode of abd pain and diarrhea about 2 weeks ago x 4 days.  Has resolved now.     Allergies (verified): No Known Drug Allergies  Past History:  Past medical history reviewed for relevance to current acute and chronic problems.  Past Medical History: Reviewed history from 07/19/2009 and no changes required. Mixed pattern liver enzyme elevation: **USED ETOH IN 2009 **2009: ANA, ASMA  TCS  JAN 2009-Anastamotic ulcer 2O TO ISCHEMIA, no microscopic colitis Esophageal Stricture and gastritis-EGD/dilation JAN 2009 Diarrhea multifactorial: lactose intolerance, bile-salt, IBS, SBBO (2008) SB Crohn's disease-R  hemicolectomy MAR 2006 Anxiety HTN Chronic abdominal and back pain NECK PAIN, LEFT (ICD-723.1) WRIST PAIN, LEFT (ICD-719.43) HYPERLIPIDEMIA (ICD-272.4) VARCIES, SITE NEC (ICD-456.8) LUMP OR MASS IN BREAST (ICD-611.72) TOBACCO ABUSE (ICD-305.1) CARPAL TUNNEL SYNDROME (ICD-354.0) MIGRAINE HEADACHE (ICD-346.90) ASTHMA, WITH ACUTE EXACERBATION (ICD-493.92) ALLERGIC RHINITIS (ICD-477.9)  Review of Systems General:  Denies chills and fever. ENT:  Denies earache, nasal congestion, sinus pressure, and sore throat. CV:  Complains of chest pain or discomfort; denies palpitations. Resp:  Complains of shortness of breath and wheezing; denies cough and sputum productive. GI:  Denies abdominal pain, change in bowel habits, nausea, and vomiting.  Physical Exam  General:  Well-developed,well-nourished,in no acute distress; alert,appropriate and cooperative throughout examination Head:  Normocephalic and atraumatic without obvious abnormalities. No apparent alopecia or balding. Ears:  External ear exam shows no significant lesions or deformities.  Otoscopic examination reveals clear canals, tympanic membranes are intact bilaterally without bulging, retraction, inflammation or discharge. Hearing is grossly normal bilaterally. Nose:  External nasal examination shows no deformity or inflammation. Nasal mucosa are pink and moist without lesions or exudates. L nasal polyp.   Mouth:  Oral mucosa and oropharynx without lesions or exudates.   Neck:  No deformities, masses, or tenderness noted. Lungs:  Normal respiratory effort, chest expands symmetrically. Lungs are clear to auscultation, no crackles or wheezes. Heart:  Normal rate and  regular rhythm. S1 and S2 normal without gallop, murmur, click, rub or other extra sounds. Cervical Nodes:  No lymphadenopathy noted Psych:  Cognition and judgment appear intact. Alert and cooperative with normal attention span and concentration. No apparent delusions,  illusions, hallucinations   Impression & Recommendations:  Problem # 1:  COPD (ICD-496) Assessment Deteriorated Encouraged two times a day compliance with Symbicort.  Her updated medication list for this problem includes:    Proair Hfa 108 (90 Base) Mcg/act Aers (Albuterol sulfate) .Marland Kitchen..Marland Kitchen Two puffs every 6 hours as needed    Symbicort 160-4.5 Mcg/act Aero (Budesonide-formoterol fumarate) .Marland Kitchen... Take 2 puffs bid  Problem # 2:  CHEST PAIN (ICD-786.50) Assessment: New  Orders: Cardiology Referral (Cardiology)  Problem # 3:  HYPERTENSION (ICD-401.9) Assessment: Comment Only  Her updated medication list for this problem includes:    Amlodipine Besylate 5 Mg Tabs (Amlodipine besylate) ..... One daily    Benazepril Hcl 10 Mg Tabs (Benazepril hcl) ..... One daily    Hydrochlorothiazide 12.5 Mg Caps (Hydrochlorothiazide) ..... One daily  BP today: 120/84 Prior BP: 100/60 (08/04/2009)  Prior 10 Yr Risk Heart Disease: 3 % (01/11/2009)  Labs Reviewed: K+: 4.2 (12/22/2008) Creat: : 0.58 (12/22/2008)   Chol: 308 (12/22/2008)   HDL: 191 (12/22/2008)   LDL: 98 (12/22/2008)   TG: 93 (12/22/2008)  Orders: T-Comprehensive Metabolic Panel ([id number])  Problem # 4:  NASAL POLYP (ICD-471.0) Assessment: New  Complete Medication List: 1)  Hydrocodone-acetaminophen 5-500 Mg Tabs (Hydrocodone-acetaminophen) .... One three times a day as needed 2)  Amlodipine Besylate 5 Mg Tabs (Amlodipine besylate) .... One daily 3)  Proair Hfa 108 (90 Base) Mcg/act Aers (Albuterol sulfate) .... Two puffs every 6 hours as needed 4)  Benazepril Hcl 10 Mg Tabs (Benazepril hcl) .... One daily 5)  Hydrochlorothiazide 12.5 Mg Caps (Hydrochlorothiazide) .... One daily 6)  Cholestyramine 4 Gm/dose Powd (Cholestyramine) .... 4 gram one -two times daily.  do not take within 2 hours of other meds.  hold for constipation. 7)  Valium 5 Mg Tabs (Diazepam) .... 1/2 tablet twice daily *may fill on or after Sep 09, 2009.* 8)   Tums  .... As needed 9)  Omeprazole 20 Mg Cpdr (Omeprazole) .Marland Kitchen.. 1 by mouth 30 minutes before her first meal 10)  Valium 5 Mg Tabs (Diazepam) .... 1/2 tab twice daily as needed 11)  Hydrocodone-acetaminophen 7.5-500 Mg Tabs (Hydrocodone-acetaminophen) .... Take 1 q 6 hrs as needed for pain.  do not take with hydrocodone 5 mg. 12)  Symbicort 160-4.5 Mcg/act Aero (Budesonide-formoterol fumarate) .... Take 2 puffs bid 13)  Fluticasone Propionate 50 Mcg/act Susp (Fluticasone propionate) .... Use 2 sprays each nostril once daily  Other Orders: T-Lipid Profile ([id number]) T-CBC No Diff ([id number]) T-TSH ([id number])  Patient Instructions: 1)  Please schedule a follow-up appointment in 3 months. 2)  Tobacco is very bad for your health and your loved ones! You Should stop smoking!. 3)  Stop Smoking Tips: Choose a Quit date. Cut down before the Quit date. decide what you will do as a substitute when you feel the urge to smoke(gum,toothpick,exercise). 4)  I have referred you to a cardiologist. 5)  I have prescribed an allergy nasal spray. 6)  Continue your other medications. 7)  Use your Symbicort two times a day. Prescriptions: FLUTICASONE PROPIONATE 50 MCG/ACT SUSP (FLUTICASONE PROPIONATE) use 2 sprays each nostril once daily  #1 x 1   Entered and Authorized by:   Kennith Gain PA   Signed by:   Kennith Gain  PA on 11/14/2009   Method used:   Electronically to        [facility]. [phone]* (retail)       [address]       [place], [place]  [address]       Ph: [phone] or [phone]       Fax: [phone]   RxID:   [id number] OMEPRAZOLE 20 MG CPDR (OMEPRAZOLE) 1 by mouth 30 minutes before her first meal  #90 x 1   Entered and Authorized by:   Kennith Gain PA   Signed by:   Kennith Gain PA on 11/14/2009   Method used:   Electronically to        [facility]. [phone]* (retail)       [address]       [place], [place]  [address]        Ph: [phone] or [phone]       Fax: [phone]   RxID:   [id number] HYDROCHLOROTHIAZIDE 12.5 MG  CAPS (HYDROCHLOROTHIAZIDE) One daily  #90 x 1   Entered and Authorized by:   Kennith Gain PA   Signed by:   Kennith Gain PA on 11/14/2009   Method used:   Electronically to        [facility]. [phone]* (retail)       [address]       [place], [place]  [address]       Ph: [phone] or [phone]       Fax: [phone]   RxID:   [id number] BENAZEPRIL HCL 10 MG  TABS (BENAZEPRIL HCL) One daily  #90 x 1   Entered and Authorized by:   Kennith Gain PA   Signed by:   Kennith Gain PA on 11/14/2009   Method used:   Electronically to        [facility]. [phone]* (retail)       [address]       [place], [place]  [address]       Ph: [phone] or [phone]       Fax: [phone]   RxID:   [id number] AMLODIPINE BESYLATE 5 MG  TABS (AMLODIPINE BESYLATE) One daily  #90 Tablet x 1   Entered and Authorized by:   Kennith Gain PA   Signed by:   Kennith Gain PA on 11/14/2009   Method used:   Electronically to        [facility]. [phone]* (retail)       [address]       [place],   [address]       Ph: [phone] or [phone]       Fax: [phone]   RxID:   [id number]   Appended Document: follow up- room 3     Allergies: No Known Drug Allergies   Impression & Recommendations:  Problem # 1:  CHEST PAIN (ICD-786.50)  Orders: EKG w/ Interpretation ([address])  Complete Medication List: 1)  Hydrocodone-acetaminophen 5-500 Mg Tabs (Hydrocodone-acetaminophen) .... One three times a day as needed 2)  Amlodipine Besylate 5 Mg Tabs (Amlodipine besylate) .... One daily 3)  Proair Hfa 108 (90 Base) Mcg/act Aers (Albuterol sulfate) .... Two puffs every 6 hours as needed 4)  Benazepril Hcl 10 Mg Tabs (Benazepril  hcl) .... One daily 5)  Hydrochlorothiazide 12.5 Mg Caps (Hydrochlorothiazide) .... One  daily 6)  Cholestyramine 4 Gm/dose Powd (Cholestyramine) .... 4 gram one -two times daily.  do not take within 2 hours of other meds.  hold for constipation. 7)  Valium 5 Mg Tabs (Diazepam) .... 1/2 tablet twice daily *may fill on or after Sep 09, 2009.* 8)  Tums  .... As needed 9)  Omeprazole 20 Mg Cpdr (Omeprazole) .Marland Kitchen.. 1 by mouth 30 minutes before her first meal 10)  Valium 5 Mg Tabs (Diazepam) .... 1/2 tab twice daily as needed 11)  Hydrocodone-acetaminophen 7.5-500 Mg Tabs (Hydrocodone-acetaminophen) .... Take 1 q 6 hrs as needed for pain.  do not take with hydrocodone 5 mg. 12)  Symbicort 160-4.5 Mcg/act Aero (Budesonide-formoterol fumarate) .... Take 2 puffs bid 13)  Fluticasone Propionate 50 Mcg/act Susp (Fluticasone propionate) .... Use 2 sprays each nostril once daily

## 2010-06-06 NOTE — Assessment & Plan Note (Signed)
Summary: OV room 1   Vital Signs:  Patient profile:   55 year old female Menstrual status:  postmenopausal Height:      60 inches Weight:      119.25 pounds BMI:     23.37 O2 Sat:      97 % on Room air Pulse rate:   71 / minute Resp:     16 per minute BP sitting:   108 / 82  (left arm)  Vitals Entered By: Louie Casa CMA (February 15, 2010 10:31 AM) CC: Having problems with bronchioles   Primary Provider:  Claybon Jabs, PA  CC:  Having problems with bronchioles.  History of Present Illness: Pt states she continues to have cough and chest congestion.  Had taken Prednisone 5 mg x 5 days but no improvement. ( was suppose to take 10 mg daily x 5 days)  In past was prescribed Prednisone 20 mg but would have to increase her Valium while on it because made her jittery.  She states injections in the past have worked well for her for this. Phlegm is clear, sometimes yellow or brown. No fever or chills. Is using Symbicort two times a day and albuterol inhaler as needed.  Is having a lot of esophageal reflux.  Vomits "bubbles".  Is not taking Omeprazole daily. Uses Tums as needed.        Current Medications (verified): 1)  Hydrocodone-Acetaminophen 5-500 Mg Tabs (Hydrocodone-Acetaminophen) .... One Three Times A Day As Needed 2)  Amlodipine Besylate 5 Mg  Tabs (Amlodipine Besylate) .... One Daily 3)  Proair Hfa 108 (90 Base) Mcg/act  Aers (Albuterol Sulfate) .... Two Puffs Every 6 Hours As Needed 4)  Benazepril Hcl 10 Mg  Tabs (Benazepril Hcl) .... One Daily 5)  Hydrochlorothiazide 12.5 Mg  Caps (Hydrochlorothiazide) .... One Daily 6)  Cholestyramine 4 Gm/dose Powd (Cholestyramine) .... 4 Gram One -Two Times Daily.  Do Not Take Within 2 Hours of Other Meds.  Hold For Constipation. 7)  Tums .... As Needed 8)  Omeprazole 20 Mg Cpdr (Omeprazole) .Marland Kitchen.. 1 By Mouth 30 Minutes Before Her First Meal 9)  Valium 5 Mg Tabs (Diazepam) .... 1/2 Tab Twice Daily As Needed 10)  Symbicort 160-4.5  Mcg/act Aero (Budesonide-Formoterol Fumarate) .... Take 2 Puffs Two Times A Day. 11)  Levsin/sl 0.125 Mg Subl (Hyoscyamine Sulfate) .Marland Kitchen.. 1-2 Sl Every 4-6 Hours As Needed Abd Pain and Cramping 12)  Prednisone 20 Mg Tabs (Prednisone) .... Take 1/4 Tab By Mouth Daily  Allergies (verified): No Known Drug Allergies  Past History:  Past medical history reviewed for relevance to current acute and chronic problems.  Past Medical History: Reviewed history from 11/21/2009 and no changes required. 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 JUNE 2011 **USED ETOH IN 2009 **2009: ANA, ASMA  TCS  JAN 2009-Anastamotic ulcer 2O TO ISCHEMIA, no microscopic colitis Esophageal Stricture and gastritis-EGD/dilation JAN 2009 Diarrhea multifactorial: lactose intolerance, bile-salt, IBS, SBBO (2008) SB Crohn's disease-R hemicolectomy MAR 2006 Anxiety HTN Chronic abdominal and back pain NECK PAIN, LEFT (ICD-723.1) WRIST PAIN, LEFT (ICD-719.43) HYPERLIPIDEMIA (ICD-272.4) VARCIES, SITE NEC (ICD-456.8) LUMP OR MASS IN BREAST (ICD-611.72) TOBACCO ABUSE (ICD-305.1) CARPAL TUNNEL SYNDROME (ICD-354.0) MIGRAINE HEADACHE (ICD-346.90) ASTHMA, WITH ACUTE EXACERBATION (ICD-493.92) ALLERGIC RHINITIS (ICD-477.9)  Review of Systems General:  Denies chills and fever. ENT:  Denies earache, nasal congestion, and sore throat. CV:  Denies chest pain or discomfort and palpitations. Resp:  Complains of cough, shortness of breath, sputum productive, and wheezing. GI:  Complains of abdominal  pain, indigestion, nausea, and vomiting; denies change in bowel habits.  Physical Exam  General:  Well-developed,well-nourished,in no acute distress; alert,appropriate and cooperative throughout examination Head:  Normocephalic and atraumatic without obvious abnormalities. No apparent alopecia or balding. Ears:  External ear exam shows no significant lesions or deformities.  Otoscopic examination reveals clear canals, tympanic  membranes are intact bilaterally without bulging, retraction, inflammation or discharge. Hearing is grossly normal bilaterally. Nose:  External nasal examination shows no deformity or inflammation. Nasal mucosa are pink and moist without lesions or exudates. Mouth:  Oral mucosa and oropharynx without lesions or exudates.   Neck:  No deformities, masses, or tenderness noted. Lungs:  Normal respiratory effort, chest expands symmetrically. Lungs are clear to auscultation, no crackles or wheezes. Heart:  Normal rate and regular rhythm. S1 and S2 normal without gallop, murmur, click, rub or other extra sounds. Cervical Nodes:  No lymphadenopathy noted Psych:  Cognition and judgment appear intact. Alert and cooperative with normal attention span and concentration. No apparent delusions, illusions, hallucinations   Impression & Recommendations:  Problem # 1:  BRONCHITIS, CHRONIC (ICD-491.9) Assessment Comment Only  Advised pt to continue inhalers.  Discontinue smoking.  Orders: Chest - 2 Views (Chest 2 Views) Depo- Medrol 76m (J1040) Admin of Therapeutic Inj  intramuscular or subcutaneous (([id number]  Problem # 2:  GERD (ICD-530.81) Assessment: Comment Only Discussed Omeprazole before dinner instead of morning to see if better tolerates it.  Encouraged pt to take daily.  Discussed that reflux could be increasing her cough also.  Her updated medication list for this problem includes:    Omeprazole 20 Mg Cpdr (Omeprazole) ..Marland Kitchen.. 1 by mouth 30 minutes before her first meal    Levsin/sl 0.125 Mg Subl (Hyoscyamine sulfate) ..Marland Kitchen.. 1-2 sl every 4-6 hours as needed abd pain and cramping  Problem # 3:  HYPERTENSION (ICD-401.9) Assessment: Comment Only  Her updated medication list for this problem includes:    Amlodipine Besylate 5 Mg Tabs (Amlodipine besylate) ..... One daily    Benazepril Hcl 10 Mg Tabs (Benazepril hcl) ..... One daily    Hydrochlorothiazide 12.5 Mg Caps (Hydrochlorothiazide) .....  One daily  BP today: 108/82 Prior BP: 128/86 (01/19/2010)  Prior 10 Yr Risk Heart Disease: 3 % (01/11/2009)  Labs Reviewed: K+: 4.7 (11/16/2009) Creat: : 0.67 (11/16/2009)   Chol: 255 (11/16/2009)   HDL: 137 (11/16/2009)   LDL: 103 (11/16/2009)   TG: 73 (11/16/2009)  Complete Medication List: 1)  Hydrocodone-acetaminophen 5-500 Mg Tabs (Hydrocodone-acetaminophen) .... One three times a day as needed 2)  Amlodipine Besylate 5 Mg Tabs (Amlodipine besylate) .... One daily 3)  Proair Hfa 108 (90 Base) Mcg/act Aers (Albuterol sulfate) .... Two puffs every 6 hours as needed 4)  Benazepril Hcl 10 Mg Tabs (Benazepril hcl) .... One daily 5)  Hydrochlorothiazide 12.5 Mg Caps (Hydrochlorothiazide) .... One daily 6)  Cholestyramine 4 Gm/dose Powd (Cholestyramine) .... 4 gram one -two times daily.  do not take within 2 hours of other meds.  hold for constipation. 7)  Tums  .... As needed 8)  Omeprazole 20 Mg Cpdr (Omeprazole) ..Marland Kitchen. 1 by mouth 30 minutes before her first meal 9)  Valium 5 Mg Tabs (Diazepam) .... 1/2 tab twice daily as needed 10)  Symbicort 160-4.5 Mcg/act Aero (Budesonide-formoterol fumarate) .... Take 2 puffs two times a day. 11)  Levsin/sl 0.125 Mg Subl (Hyoscyamine sulfate) ..Marland Kitchen. 1-2 sl every 4-6 hours as needed abd pain and cramping  Other Orders: Influenza Vaccine NON MCR (([id number]  Patient Instructions:  1)  Please schedule a follow-up appointment in 3 months. 2)  You have received Depo Medrol today for your COPD and a flu shot today. 3)  Discontinue Prednisone pills 4)  Continue using your inhalers. 5)  I am referring you to the [place] program to help you with some of your medication expense. 6)  Tobacco is very bad for your health and your loved ones! You Should stop smoking!. 7)  Stop Smoking Tips: Choose a Quit date. Cut down before the Quit date. decide what you will do as a substitute when you feel the urge to  smoke(gum,toothpick,exercise). Prescriptions: SYMBICORT 160-4.5 MCG/ACT AERO (BUDESONIDE-FORMOTEROL FUMARATE) take 2 puffs two times a day.  #3 x 3   Entered and Authorized by:   Kennith Gain PA   Signed by:   Kennith Gain PA on 02/15/2010   Method used:   Printed then faxed to ...       [address] [phone]* (retail)       [address]       [place], [place]  [address]       Ph: [phone] or [phone]       Fax: [phone]   RxID:   [id number] PROAIR HFA 108 (90 BASE) MCG/ACT  AERS (ALBUTEROL SULFATE) Two puffs every 6 hours as needed  #3 x 3   Entered and Authorized by:   Kennith Gain PA   Signed by:   Kennith Gain PA on 02/15/2010   Method used:   Printed then faxed to ...       [address] [phone]* (retail)       [address]       [place], [place]  [address]       Ph: [phone] or [phone]       Fax: [phone]   RxID:   [id number]    Immunizations Administered:  Influenza Vaccine:    Vaccine Type: Fluvax Non-MCR    Site: right deltoid    Mfr: novartis    Dose: 0.5 ml    Route: IM    Given by: Louie Casa CMA    Exp. Date: 09/2010    Lot #: [id number]    VIS given: 11/28/09 version given February 15, 2010.    Medication Administration  Injection # 1:    Medication: Depo- Medrol 68m    Diagnosis: BRONCHITIS, CHRONIC (ICD-491.9)    Route: IM    Site: LUOQ gluteus    Exp Date: 10/2010    Lot #: [id number]    Mfr: Pharmacia    Comments: 80 mg given    Patient tolerated injection without complications    Given by: LLouie CasaCMA (February 15, 2010 11:34 AM)  Orders Added: 1)  Chest - 2 Views [Chest 2 Views] 2)  Influenza Vaccine NON MCR [00028] 3)  Depo- Medrol 865m[J1040] 4)  Admin of Therapeutic Inj  intramuscular or subcutaneous [96372] 5)  Est. Patient Level IV [[id number]]

## 2010-06-06 NOTE — Progress Notes (Signed)
  Phone Note Call from Patient   Caller: Patient Summary of Call: patient states the nasal spray is too expensive, is there an alternative Initial call taken by: Baldomero Lamy LPN,  November 15, 8673 4:49 PM  Follow-up for Phone Call        Unfortunately no .  That is the only generic.  The others come only in name brand and will be more expensive. Follow-up by: Kennith Gain PA,  November 15, 2009 3:19 PM  Additional Follow-up for Phone Call Additional follow up Details #1::        no home remedies? Additional Follow-up by: Baldomero Lamy LPN,  November 15, 2008 0:71 PM    Additional Follow-up for Phone Call Additional follow up Details #2::    She can use a netti pot to help with the congestion, but wont help shrink the polyp.  The prescription nasal steroids are the treatment, or surgery if they become large and block the airway.  Advise ptnot to use the over the counter nasal sprays like Afrin. Follow-up by: Kennith Gain PA,  November 16, 2009 8:06 AM  Additional Follow-up for Phone Call Additional follow up Details #3:: Details for Additional Follow-up Action Taken: called patient and she wasn't home, husband will get patient to call back  Additional Follow-up by: Kate Sable LPN,  November 17, 2195 58:83 AM  Mailed the saline nasal wash paper to her Patient aware

## 2010-06-06 NOTE — Progress Notes (Signed)
Summary: medicaid appeal- needs letter  Phone Note Call from Patient Call back at Home Phone [phone]   Caller: Patient Summary of Call: pt called- she is going to a court appeal for Medicaid. pt wants copies of recent ov notes and wants a letter from [place] stating she is disabled d/t her crohns dx and cannot work. she would like to speak with [facility] if possible or if [facility] feels pt should come in for ov, she will. Pts appeal is August 11th. please advise Initial call taken by: Burnadette Peter LPN,  November 29, 7735 3:66 PM     Appended Document: medicaid appeal- needs letter Please callpt. I have already discussed this issue with Ms. Matchett and told her I do not think she is disabled by her Crohn's disease. Her disease is controlled on no medical therapy.  Appended Document: medicaid appeal- needs letter tried to call pt- LMOM  Appended Document: medicaid appeal- needs letter Called and no answer.  Appended Document: medicaid appeal- needs letter Pt informed. (pt says she would like a phone call from Dr. Oneida Alar).  Appended Document: medicaid appeal- needs letter & referral to [facility] pt. LVM- call me at [phone].  Appended Document: medicaid appeal- needs letter Explained to pt she is not disabled from her Crohn's disease and her chronic abd pain is likely 2o to IBS which is exacerbated by anxiety. She has no evidence of active disease and receiveing no therapy. Pt needs referral for mental health services, dx: Anxiety, and pain clinic specialist, dx: chronic abd pain.

## 2010-06-06 NOTE — Letter (Signed)
Summary: Recall, Labs Needed  [facility]  [address]   [place], [place] [address]   Phone: [phone]  Fax: [phone]    October 20, 2009  [name] [name] [name],   [address] 07/29/1955   Dear Ms. [name],   Our records indicate it is time to repeat your blood work.  You can take the enclosed form to the lab on or near the date indicated.  Please make note of the new location of the lab:   [place], 2nd floor   [place] office will call you within a week to ten business days with the results.  If you do not hear from [place] in 10 business days, you should call the office.  If you have any questions regarding this, call the office at [phone], and ask for the nurse.  Labs are due on 11/07/2009.   Sincerely,    Burnadette Peter LPN  [facility] Ph: [phone]   Fax: [phone]

## 2010-06-06 NOTE — Letter (Signed)
Summary: MEDICAL RELEASE  MEDICAL RELEASE   Imported By: Dierdre Harness 04/13/2010 10:55:53  _____________________________________________________________________  External Attachment:    Type:   Image     Comment:   External Document

## 2010-06-06 NOTE — Progress Notes (Signed)
Summary: VM to return call  Phone Note Call from Patient   Caller: Patient Summary of Call: Pt left Vm to call. I returned call and got recording saying that the VM box has not been set up yet. Initial call taken by: Waldon Merl LPN,  December 13, 395 2:33 PM     Appended Document: VM to return call LATE ENTRY:  pt called at end of day yesterday, i told her that i had just tried to call because i had a VM from her. She said she has already spoken with Craige Cotta here, and she did not have any questions/messages for me.

## 2010-06-06 NOTE — Letter (Signed)
Summary: Recall, Labs Needed  [facility]  [address]   [place], [place] [address]   Phone: [phone]  Fax: [phone]    Sep 22, 2009  BERNIECE ABID [place], [place]  [address] 1955-08-05   Dear Ms. Catala,   Our records indicate it is time to repeat your blood work.  You can take the enclosed form to the lab on or near the date indicated.  Please make note of the new location of the lab:   [place], 2nd floor   Between office will call you within a week to ten business days with the results.  If you do not hear from [place] in 10 business days, you should call the office.  If you have any questions regarding this, call the office at [phone], and ask for the nurse.  Labs are due on 07/19/2009.   Sincerely,    Burnadette Peter LPN  [facility] Ph: [phone]   Fax: [phone]   Appended Document: Recall, Labs Needed date is wrong. disreguard letter

## 2010-06-06 NOTE — Miscellaneous (Signed)
Summary: Orders Update  Clinical Lists Changes  Orders: Added new Test order of T-Hepatic Function [id number]) - Signed  Appended Document: Orders Update LMOM next labs needed in July and will mail order then.

## 2010-06-06 NOTE — Assessment & Plan Note (Signed)
Summary: follow up - room 3   Vital Signs:  Patient profile:   55 year old female Menstrual status:  postmenopausal Height:      60 inches Weight:      118.25 pounds BMI:     23.18 O2 Sat:      97 % on Room air Pulse rate:   93 / minute Resp:     16 per minute BP sitting:   140 / 90  (left arm)  Vitals Entered By: Baldomero Lamy LPN (July 11, 175 93:90 AM)  Serial Vital Signs/Assessments:  Time      Position  BP       Pulse  Resp  Temp     By                     114/80                         Kennith Gain PA  CC: follow-up visit Is Patient Diabetic? No Pain Assessment Patient in pain? no        Primary Provider:  Kennith Gain PA  CC:  follow-up visit.  History of Present Illness: Pt is here today for follow up. She had a flare of chron's last week. See's Dr Oneida Alar again 3/15.  Is taking her medications regularly.  Her allergy syptoms are still bothering her.  Didn't pick up the loratadine Rx.  Unable to afford it.  so not taking anything currently for her allergies.  States she knows she is allergic to animals & she does have a dog at home.  She has cut her cigs down to 2-3 per day.  Wants to quit completey.  Has cough & chest congestion first thing in am.  Uses albuterol 1x/d in the ams.  She still doen't have Lucia Gaskins due to cost.  She had to resched her PFT due to her Chron's flare up last wk.  Pt states she has been having some burning discomfort in her Rt knee off & on.  Not assoc with activity. No swelling or redness.  Lasts few mins then is gone.     Current Medications (verified): 1)  Hydrocodone-Acetaminophen 5-500 Mg Tabs (Hydrocodone-Acetaminophen) .... One Three Times A Day As Needed 2)  Amlodipine Besylate 5 Mg  Tabs (Amlodipine Besylate) .... One Daily 3)  Proair Hfa 108 (90 Base) Mcg/act  Aers (Albuterol Sulfate) .... Two Puffs Every 6 Hours As Needed 4)  Benazepril Hcl 10 Mg  Tabs (Benazepril Hcl) .... One Daily 5)  Hydrochlorothiazide 12.5 Mg  Caps  (Hydrochlorothiazide) .... One Daily 6)  Cholestyramine 4 Gm/dose Powd (Cholestyramine) .... 4 Gram Bid.  Do Not Take Within 2 Hours of Other Meds.  Hold For Constipation. 7)  Valium 5 Mg Tabs (Diazepam) .... One Every 6 Hours As Needed Per Dr. Arnoldo Morale 8)  Advair Diskus 100-50 Mcg/dose Aepb (Fluticasone-Salmeterol) .... Two Times A Day 9)  Tums .... As Needed 10)  Omeprazole 20 Mg Cpdr (Omeprazole) .Marland Kitchen.. 1 By Mouth 30 Minutes Before Her First Meal 11)  Loratadine 10 Mg Tabs (Loratadine) .Marland Kitchen.. 1 Daily 12)  Valium 5 Mg Tabs (Diazepam) .... 1/2 Tab Twice Daily As Needed  Allergies (verified): 1)  ! Flexeril (Cyclobenzaprine Hcl)  Past History:  Past medical history reviewed for relevance to current acute and chronic problems.  Past Medical History: Reviewed history from 06/12/2009 and no changes required. Anastamotic ulcer-TCS  JAN 2009 Esophageal Stricture and gastritis-EGD/dilation  JAN 2009 Diarrhea multifactorial: lactose intolerance, bile-salt, IBS Crohn's disease-R hemicolectomy Anxiety HTN Chronic abdominal and back pain NECK PAIN, LEFT (ICD-723.1) WRIST PAIN, LEFT (ICD-719.43) HYPERLIPIDEMIA (ICD-272.4) VARCIES, SITE NEC (ICD-456.8) LUMP OR MASS IN BREAST (ICD-611.72) TOBACCO ABUSE (ICD-305.1) CARPAL TUNNEL SYNDROME (ICD-354.0) MIGRAINE HEADACHE (ICD-346.90) ASTHMA, WITH ACUTE EXACERBATION (ICD-493.92) ALLERGIC RHINITIS (ICD-477.9)  Review of Systems General:  Denies chills and fever. ENT:  Complains of nasal congestion; denies sinus pressure and sore throat. CV:  Denies chest pain or discomfort and palpitations. Resp:  Complains of cough; denies shortness of breath, sputum productive, and wheezing. GI:  Denies abdominal pain and diarrhea. Heme:  Denies enlarge lymph nodes. Allergy:  Denies itching eyes, seasonal allergies, and sneezing.  Physical Exam  General:  Well-developed,well-nourished,in no acute distress; alert,appropriate and cooperative throughout  examination Head:  Normocephalic and atraumatic without obvious abnormalities. No apparent alopecia or balding. Eyes:  pupils equal, pupils round, pupils reactive to light, and no injection.  +watery Ears:  External ear exam shows no significant lesions or deformities.  Otoscopic examination reveals clear canals, tympanic membranes are intact bilaterally without bulging, retraction, inflammation or discharge. Hearing is grossly normal bilaterally. Nose:  External nasal examination shows no deformity or inflammation. Nasal mucosa are pink and moist without lesions or exudates. Mouth:  Oral mucosa and oropharynx without lesions or exudates.  Teeth in good repair. Neck:  No deformities, masses, or tenderness noted. Lungs:  Normal respiratory effort, chest expands symmetrically. Lungs are clear to auscultation, no crackles or wheezes. Heart:  Normal rate and regular rhythm. S1 and S2 normal without gallop, murmur, click, rub or other extra sounds. Msk:  Bilat mild patellar crepitus.  Rt knee FROM, no effusion,  nontender to palp, &  no ligament instability Extremities:  No clubbing, cyanosis, edema, or deformity noted with normal full range of motion of all joints.   Cervical Nodes:  No lymphadenopathy noted Psych:  Cognition and judgment appear intact. Alert and cooperative with normal attention span and concentration. No apparent delusions, illusions, hallucinations   Impression & Recommendations:  Problem # 1:  ALLERGIC RHINITIS (ICD-477.9) Assessment Deteriorated Pt declined Depo Medrol. Discussed use of allergy med as needed.  Her updated medication list for this problem includes:    Loratadine 10 Mg Tabs (Loratadine) .Marland Kitchen... 1 daily  Problem # 2:  ASTHMA, MILD (ICD-493.90) Assessment: Unchanged Continue with efforts to d/c smoking. Continue albuterol as needed. Await PFT results.  Her updated medication list for this problem includes:    Proair Hfa 108 (90 Base) Mcg/act Aers (Albuterol  sulfate) .Marland Kitchen..Marland Kitchen Two puffs every 6 hours as needed    Advair Diskus 100-50 Mcg/dose Aepb (Fluticasone-salmeterol) .Marland Kitchen..Marland Kitchen Two times a day  Problem # 3:  CROHN'S DISEASE (ICD-555.9) Assessment: Unchanged Continue f/u with Dr Oneida Alar.  Problem # 4:  HYPERTENSION (ICD-401.9) Assessment: Unchanged  Her updated medication list for this problem includes:    Amlodipine Besylate 5 Mg Tabs (Amlodipine besylate) ..... One daily    Benazepril Hcl 10 Mg Tabs (Benazepril hcl) ..... One daily    Hydrochlorothiazide 12.5 Mg Caps (Hydrochlorothiazide) ..... One daily  BP today: 140/90  Recheck 114/80 Prior BP: 117/74 (06/12/2009)  Prior 10 Yr Risk Heart Disease: 3 % (01/11/2009)  Labs Reviewed: K+: 4.2 (12/22/2008) Creat: : 0.58 (12/22/2008)   Chol: 308 (12/22/2008)   HDL: 191 (12/22/2008)   LDL: 98 (12/22/2008)   TG: 93 (12/22/2008)  Problem # 5:  KNEE PAIN (ZOX-096.04) Assessment: New Probable degenerative arthritis.  Her updated medication list for  this problem includes:    Hydrocodone-acetaminophen 5-500 Mg Tabs (Hydrocodone-acetaminophen) ..... One three times a day as needed  Complete Medication List: 1)  Hydrocodone-acetaminophen 5-500 Mg Tabs (Hydrocodone-acetaminophen) .... One three times a day as needed 2)  Amlodipine Besylate 5 Mg Tabs (Amlodipine besylate) .... One daily 3)  Proair Hfa 108 (90 Base) Mcg/act Aers (Albuterol sulfate) .... Two puffs every 6 hours as needed 4)  Benazepril Hcl 10 Mg Tabs (Benazepril hcl) .... One daily 5)  Hydrochlorothiazide 12.5 Mg Caps (Hydrochlorothiazide) .... One daily 6)  Cholestyramine 4 Gm/dose Powd (Cholestyramine) .... 4 gram bid.  do not take within 2 hours of other meds.  hold for constipation. 7)  Valium 5 Mg Tabs (Diazepam) .... One every 6 hours as needed per dr. Arnoldo Morale 8)  Advair Diskus 100-50 Mcg/dose Aepb (Fluticasone-salmeterol) .... Two times a day 9)  Tums  .... As needed 10)  Omeprazole 20 Mg Cpdr (Omeprazole) .Marland Kitchen.. 1 by mouth 30  minutes before her first meal 11)  Loratadine 10 Mg Tabs (Loratadine) .Marland Kitchen.. 1 daily 12)  Valium 5 Mg Tabs (Diazepam) .... 1/2 tab twice daily as needed  Patient Instructions: 1)  Please schedule a follow-up appointment in 3 months. 2)  Tobacco is very bad for your health and your loved ones! You Should stop smoking!. 3)  Stop Smoking Tips: Choose a Quit date. Cut down before the Quit date. decide what you will do as a substitute when you feel the urge to smoke(gum,toothpick,exercise). 4)  Continue follow up with Dr. Oneida Alar.   5)  Speak to the pharmacist about purchasing a partial prescription of the Loratdine & take it on the days needed for allergies.

## 2010-06-06 NOTE — Letter (Signed)
Summary: [facility]  [facility] [place]. [address], [place] [address]   Phone: [phone]  Fax: [phone]    Nuclear Medicine 1-Day Stress Test Information Sheet  Re:     [name] [name]   DOB:     05-Nov-1955 MRN:     [id number] Weight:  Appointment Date: Register at: Appointment Time: Referring MD:  _X__Exercise Stress  __Adenosine   __Dobutamine  __Lexiscan  __Persantine   __Thallium  Urgency: ____1 (next day)   ____2 (one week)    ____3 (PRN)  Patient will receive Follow Up call with results: Patient needs follow-up appointment:  Instructions regarding medication:  How to prepare for your stress test: 1. DO NOT eat or dring 6 hours prior to your arrival time. This includes no caffeine (coffee, tea, sodas, chocolate) if you were instructed to take your medications, drink water with it. 2. DO NOT use any tobacco products for at leaset 8 hours prior to arrival. 3. DO NOT wear dresses or any clothing that may have metal clasps or buttons. 4. Wear short sleeve shirts, loose clothing, and comfortalbe walking shoes. 5. DO NOT use lotions, oils or powder on your chest before the test. 6. The test will take approximately 3-4 hours from the time you arrive until completion. 7. To register the day of the test, go to the Short Stay entrance at [facility]. 8. If you must cancel your test, call [phone] as soon as you are aware.  After you arrive for test:   When you arrive at [facility], you will go to Short Stay to be registered. They will then send you to Radiology to check in. The Nuclear Medicine Tech will get you and start an IV in your arm or hand. A small amount of a radioactive tracer will then be injected into your IV. This tracer will then have to circulate for 30-45 minutes. During this time you will wait in the waiting room and you will be able to drink something without caffeine. A series of pictures will be taken  of your heart follwoing this waiting period. After the 1st set of pictures you will go to the stress lab to get ready for your stress test. During the stress test, another small amount of a radioactive tracer will be injected through your IV. When the stress test is complete, there is a short rest period while your heart rate and blood pressure will be monitored. When this monitoring period is complete you will have another set of pictrues taken. (The same as the 1st set of pictures). These pictures are taken between 15 minutes and 1 hour after the stress test. The time depends on the type of stress test you had. Your doctor will inform you of your test results within 7 days after test.    The possibilities of certain changes are possible during the test. They include abnormal blood pressure and disorders of the heart. Side effects of persantine or adenosine can include flushing, chest pain, shortness of breath, stomach tightness, headache and light-headedness. These side effects usually do not last long and are self-resolving. Every effort will be made to keep you comfortable and to minimize complications by obtaining a medical history and by close observation during the test. Emergency equipment, medications, and trained personnel are available to deal with any unusual situation which may arise.  Please notify office at least 48 hours in advance if you are unable to keep  this appt.

## 2010-06-06 NOTE — Assessment & Plan Note (Signed)
Summary: ELEVATED ENZYMES, CHRONS DISEASE, diarrhea   Visit Type:  Follow-up Visit Primary Care Provider:  Moshe Cipro, M.D.  Chief Complaint:  F/U  Crohn's/ elevated LFT's.  History of Present Illness: Pt having difficulty finding a PCP and now has Dr. Moshe Cipro. [facility] Zenia Resides. Last week had diarrhea, nausea, and vomiting. Now BMs: 1x/d and s/t 3-4x/day. Rare rectal bleeding, ? hemorrhoids. Appetite: varies. Weighed 120 lbs in 2010. Has spasms in RUQ that used to go away and now more frequent with activity. Still applying for disability.  Current Medications (verified): 1)  Hydrocodone-Acetaminophen 5-500 Mg Tabs (Hydrocodone-Acetaminophen) .... One Three Times A Day As Needed 2)  Amlodipine Besylate 5 Mg  Tabs (Amlodipine Besylate) .... One Daily 3)  Proair Hfa 108 (90 Base) Mcg/act  Aers (Albuterol Sulfate) .... Two Puffs Every 6 Hours As Needed 4)  Benazepril Hcl 10 Mg  Tabs (Benazepril Hcl) .... One Daily 5)  Hydrochlorothiazide 12.5 Mg  Caps (Hydrochlorothiazide) .... One Daily 6)  Cholestyramine 4 Gm/dose Powd (Cholestyramine) .... 4 Gram One -Two Times Daily.  Do Not Take Within 2 Hours of Other Meds.  Hold For Constipation. 7)  Valium 5 Mg Tabs (Diazepam) .... 1/2 Tablet Twice Daily 8)  Advair Diskus 100-50 Mcg/dose Aepb (Fluticasone-Salmeterol) .... Two Times A Day 9)  Tums .... As Needed 10)  Omeprazole 20 Mg Cpdr (Omeprazole) .Marland Kitchen.. 1 By Mouth 30 Minutes Before Her First Meal 11)  Valium 5 Mg Tabs (Diazepam) .... 1/2 Tab Twice Daily As Needed  Past History:  Past Medical History: Mixed pattern liver enzyme elevation: **USED ETOH IN 2009 **2009: ANA, ASMA  TCS  JAN 2009-Anastamotic ulcer 2O TO ISCHEMIA, no microscopic colitis Esophageal Stricture and gastritis-EGD/dilation JAN 2009 Diarrhea multifactorial: lactose intolerance, bile-salt, IBS, SBBO (2008) SB Crohn's disease-R hemicolectomy MAR 2006 Anxiety HTN Chronic abdominal and back  pain NECK PAIN, LEFT (ICD-723.1) WRIST PAIN, LEFT (ICD-719.43) HYPERLIPIDEMIA (ICD-272.4) VARCIES, SITE NEC (ICD-456.8) LUMP OR MASS IN BREAST (ICD-611.72) TOBACCO ABUSE (ICD-305.1) CARPAL TUNNEL SYNDROME (ICD-354.0) MIGRAINE HEADACHE (ICD-346.90) ASTHMA, WITH ACUTE EXACERBATION (ICD-493.92) ALLERGIC RHINITIS (ICD-477.9)  Review of Systems       2007: 121 LBS, TSH/HFP WNLS 2008: B12 308 2009: 124 LBS, ALT 42 (H) AST 37 ALK PHOS 105 T BILI 0.7, DEC 2009: AST 51 ALT 94 ALK PHOS 90 TBILI 0.73 2010: ALT 39 AST 32 ALK PHOS 106,  JAN 2011: MRCP-NO PSC OR STONES  Vital Signs:  Patient profile:   55 year old female Menstrual status:  postmenopausal Height:      60 inches Weight:      118 pounds BMI:     23.13 Temp:     98.3 degrees F oral Pulse rate:   60 / minute BP sitting:   126 / 88  (left arm) Cuff size:   regular  Vitals Entered By: Waldon Merl LPN (July 19, 3788 24:09 AM)  Physical Exam  General:  Well developed, well nourished, no acute distress. Head:  Normocephalic and atraumatic. Eyes:  PERRLA, no icterus. Mouth:  No deformity or lesions. Lungs:  Clear throughout to auscultation. Heart:  Regular rate and rhythm; no murmurs Abdomen:  soft, normal bowel sounds, no distention, no masses, no guarding, no rigidity, Mild RUQ and LUQ tenderness Neurologic:  Alert and  oriented x4;  grossly normal neurologically.  Impression & Recommendations:  Problem # 1:  CROHN'S DISEASE (ICD-555.9) Surgical remission acheived but has anastamotic ulcer. CBC prior to liver biopsy. REFILLED Vicodin, next  Rx due 8/16 2011. OPV JUL 2011.   Orders: T-PT (Prothrombin Time) (22000) T-CBC w/Diff ([id number]) Est. Patient Level IV (97471)  Problem # 2:  TRANSAMINASES, SERUM, ELEVATED (ICD-790.4) Extensive GI workup revealed no etiology and liver enzymes remain mildly elevated. Pt denies EtOH? Proceed with liver biopsy. CBC/PT/INR prior to liver biopsy.   Orders: T-PT (Prothrombin  Time) (22000) T-CBC w/Diff (85501-58682) Est. Patient Level IV (57493)  Problem # 3:  MACROCYTIC ANEMIA (ICD-281.9) Assessment: Unchanged  s/p R hemicolectomy. RECHECK B12  with next visit.  Orders: Est. Patient Level IV (55217)  Problem # 4:  DIARRHEA (ICD-787.91) Assessment: Unchanged refill Questran.  CC: PCP Prescriptions: OMEPRAZOLE 20 MG CPDR (OMEPRAZOLE) 1 by mouth 30 minutes before her first meal  #30 x 11   Entered and Authorized by:   Danie Binder MD   Signed by:   Danie Binder MD on 07/19/2009   Method used:   Electronically to        [facility]. [phone]* (retail)       [address]       [place], [place]  [address]       Ph: [phone] or [phone]       Fax: [phone]   RxID:   [id number] CHOLESTYRAMINE 4 GM/DOSE POWD (CHOLESTYRAMINE) 4 gram one -two times daily.  Do not take within 2 hours of other meds.  Hold for constipation.  #1 mo supply x 11   Entered and Authorized by:   Danie Binder MD   Signed by:   Danie Binder MD on 07/19/2009   Method used:   Electronically to        [facility]. [phone]* (retail)       [address]       [place], [place]  [address]       Ph: [phone] or [phone]       Fax: [phone]   RxID:   [id number]

## 2010-06-06 NOTE — Progress Notes (Signed)
Summary: refill on valium  Phone Note Call from Patient   Summary of Call: pt was in on monday and [facility] still don't have a rx valium [phone] Initial call taken by: Lenn Cal,  June 15, 2009 11:45 AM  Follow-up for Phone Call        Rx Called In Follow-up by: Jimmey Ralph LPN,  June 15, 5788 2:32 PM    Prescriptions: VALIUM 5 MG TABS (DIAZEPAM) 1/2 tab twice daily as needed  #30 x 2   Entered by:   Jimmey Ralph LPN   Authorized by:   Tula Nakayama MD   Signed by:   Jimmey Ralph LPN on 38/33/3832   Method used:   Printed then faxed to ...       [address] [phone]* (retail)       [address]       [place], [place]  [address]       Ph: [phone] or [phone]       Fax: [phone]   RxID:   [id number]

## 2010-06-07 NOTE — Assessment & Plan Note (Signed)
Summary: FUNCTIONAL DIARHHEA, hX: CROHNS,DIARRHEA,ELEVATED LIVER ENZYMES   Visit Type:  Follow-up Visit Primary Care Provider:  Moshe Cipro, M.D.  Chief Complaint:  Crohn's and Diarrhea.  History of Present Illness: Hoarseness since before Thanksgiving. Coughing and quit smoking been 5 weeks. Bowels are the same. BMs: 1-2: best and worst: 4-5-loose, watery, soft, regular. Pain: spasms in right periumbilical region and if bad it radiates across to left and lays down comes and goes, and pain gets better: and can last allday. Rx with a pain. No [place] and [place]. Out of meds for over 1 month. Intermittent RUQ, epigastrium, and LLQ pain. Has a BM and feels better. May take 15 minsx2 and up to 30 minutes.  Blood when she wipes off and on. Coughing causes increased prerssure on retum. Using Tucks for rectal pain and bleeding.    Current Medications (verified): 1)  Hydrocodone-Acetaminophen 5-500 Mg Tabs (Hydrocodone-Acetaminophen) .... One Three Times A Day As Needed 2)  Amlodipine Besylate 5 Mg  Tabs (Amlodipine Besylate) .... One Daily 3)  Proair Hfa 108 (90 Base) Mcg/act  Aers (Albuterol Sulfate) .... Two Puffs Every 6 Hours As Needed 4)  Tums .... As Needed 5)  Omeprazole 20 Mg Cpdr (Omeprazole) .Marland Kitchen.. 1 By Mouth 30 Minutes Before Her First Meal 6)  Valium 5 Mg Tabs (Diazepam) .... 1/2 Tab Twice Daily As Needed 7)  Symbicort 160-4.5 Mcg/act Aero (Budesonide-Formoterol Fumarate) .... Take 2 Puffs Two Times A Day. 8)  Accuretic 10-12.5 Mg Tabs (Quinapril-Hydrochlorothiazide) .... One Tab By Mouth Once Daily 9)  Doxycycline Hyclate 100 Mg Caps (Doxycycline Hyclate) .... Take 1 Capsule By Mouth Two Times A Day  Allergies (verified): No Known Drug Allergies  Past History:  Past Medical History: Last updated: 11/21/2009 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 JUNE 2011 **USED ETOH IN 2009 **2009: ANA, ASMA  TCS  JAN 2009-Anastamotic ulcer 2O TO ISCHEMIA, no  microscopic colitis Esophageal Stricture and gastritis-EGD/dilation JAN 2009 Diarrhea multifactorial: lactose intolerance, bile-salt, IBS, SBBO (2008) SB Crohn's disease-R hemicolectomy MAR 2006 Anxiety HTN Chronic abdominal and back pain NECK PAIN, LEFT (ICD-723.1) WRIST PAIN, LEFT (ICD-719.43) HYPERLIPIDEMIA (ICD-272.4) VARCIES, SITE NEC (ICD-456.8) LUMP OR MASS IN BREAST (ICD-611.72) TOBACCO ABUSE (ICD-305.1) CARPAL TUNNEL SYNDROME (ICD-354.0) MIGRAINE HEADACHE (ICD-346.90) ASTHMA, WITH ACUTE EXACERBATION (ICD-493.92) ALLERGIC RHINITIS (ICD-477.9)  Past Surgical History: Last updated: 10/18/2008 1. hernia removal - old abd scar 2. broken jaw repair 3. Colectomy due to Chron's 4. Tubal Pregnancy with tube removals 5. CTS left wrist 6. Left L5-S1 diskectomy using microdissection - April 2010 - Dr. 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  Has begun disability application. GETTING MEDS VIA SOCIAL SERVICES. Single-long term relationship Current Smoker Alcohol use-no Drug use-no Lives with boyfriend Education: 11th grade  Review of Systems       JULY 2011: HFP NORMAL  Vital Signs:  Patient profile:   55 year old female Menstrual status:  postmenopausal Height:      60 inches Weight:      119.50 pounds BMI:     23.42 Temp:     99.5 degrees F oral Pulse rate:   60 / minute BP sitting:   102 / 72  (left arm) Cuff size:   regular  Vitals Entered By: Waldon Merl LPN (April 17, 6332 2:36 PM)  Physical Exam  General:  Well developed, well nourished, no acute distress. Head:  Normocephalic and atraumatic. Mouth:  No deformity or lesions. Lungs:  Clear throughout to auscultation. Heart:  Regular rate and rhythm; no murmurs. Abdomen:  Soft, nontender and nondistended. Normal bowel sounds. Extremities:  No edema noted. Neurologic:  Alert and  oriented x4;  grossly normal neurologically.  Impression &  Recommendations:  Problem # 1:  IBS (ICD-564.1) Assessment Unchanged diarrhea predominant and exacerbated by depression, bile salt, and SIBO. Using Vicodin 3 per day for chronic abd pain. Did not see mental health specialist. Vicodin #90 refilled 04/23/10 to 09/22/10. Next refill begins 10/23/10. OPV in 6 mos. PT CAN'T AFFORD LEVSIN. RX fro [facility] given for Rx assistance. Add Bentyl prior to breakfast and lunch and repat q4h as needed for abd pain and diarrhea.  FILLED OUT DISABILITY PAPERWORK FILLED OUT AND GIVEN TO PT.  CC: PCP  30 MINUTES: time spent to perform H&p, fill out paperwork, and discuss management of diarrhea.  Problem # 2:  TRANSAMINASES, SERUM, ELEVATED (ICD-790.4) Assessment: Comment Only RECHECK IN JULY 2012.  Orders: Est. Patient Level IV (21308) Prescriptions: BENTYL 10 MG CAPS (DICYCLOMINE HCL) 1 by mouth 30 minutes prior to breakfast and lunch. May reapeat q4h as needed for abd pain and diarrhea  #180 x 3   Entered and Authorized by:   Danie Binder MD   Signed by:   Danie Binder MD on 04/17/2010   Method used:   Print then Give to Patient   RxID:   [id number] BENTYL 10 MG CAPS (DICYCLOMINE HCL) 1 by mouth 30 minutes prior to breakfast and lunch. May reapeat q4h as needed for abd pain and diarrhea  #60 x 5   Entered and Authorized by:   Danie Binder MD   Signed by:   Danie Binder MD on 04/17/2010   Method used:   Print then Give to Patient   RxID:   [id number]   Appended Document: FUNCTIONAL DIARHHEA, hX: CROHNS,DIARRHEA,ELEVATED LIVER ENZYMES 6 MON F/U OPV IS IN THE COMPUTER

## 2010-06-07 NOTE — Letter (Signed)
Summary: medical release  medical release   Imported By: Dierdre Harness 05/03/2010 11:29:56  _____________________________________________________________________  External Attachment:    Type:   Image     Comment:   External Document

## 2010-06-07 NOTE — Assessment & Plan Note (Signed)
Summary: office visit   Vital Signs:  Patient profile:   55 year old female Menstrual status:  postmenopausal Height:      60 inches Weight:      121 pounds BMI:     23.72 O2 Sat:      97 % Pulse rate:   87 / minute Pulse rhythm:   regular Resp:     16 per minute BP sitting:   124 / 84  (left arm) Cuff size:   regular  Vitals Entered By: Kate Sable LPN (May 21, 7122 11:13 AM) CC: Follow up chronic problems, still having problems with her throat, went to see Benjamine Mola about it   Primary Care Provider:  Moshe Cipro, M.D.  CC:  Follow up chronic problems, still having problems with her throat, and went to see Benjamine Mola about it.  History of Present Illness: Pt states she is not doing very well, depressed about Chron's and is wanting to get therapy, she denies suicidal or homicidal thought or hallucinations.   attempting to get disability  quit smoking since November   Denies recent fever or chills. Denies sinus pressure, nasal congestion , ear pain or sore throat. Denies chest congestion, or cough productive of sputum. Denies chest pain, palpitations, PND, orthopnea or leg swelling.  Denies dysuria , frequency, incontinence or hesitancy. Denies  joint pain, swelling, or reduced mobility. Denies headaches, vertigo, seizures.  Denies  rash, lesions, or itch.     Current Medications (verified): 1)  Hydrocodone-Acetaminophen 5-500 Mg Tabs (Hydrocodone-Acetaminophen) .... One Three Times A Day As Needed 2)  Amlodipine Besylate 5 Mg  Tabs (Amlodipine Besylate) .... One Daily 3)  Proair Hfa 108 (90 Base) Mcg/act  Aers (Albuterol Sulfate) .... Two Puffs Every 6 Hours As Needed 4)  Tums .... As Needed 5)  Nexium 20 Mg Cpdr (Esomeprazole Magnesium) .... Take 1 Tablet By Mouth Once A Day 6)  Valium 5 Mg Tabs (Diazepam) .... 1/2 Tab Twice Daily As Needed 7)  Symbicort 160-4.5 Mcg/act Aero (Budesonide-Formoterol Fumarate) .... Take 2 Puffs Two Times A Day. 8)  Accuretic 10-12.5 Mg Tabs  (Quinapril-Hydrochlorothiazide) .... One Tab By Mouth Once Daily 9)  Doxycycline Hyclate 100 Mg Caps (Doxycycline Hyclate) .... Take 1 Capsule By Mouth Two Times A Day 10)  Dukes Magic Mouthwash .... Rinse One Tsp 1-2 Mins Four Times Daily  Then Swallow  Allergies (verified): No Known Drug Allergies  Past History:  Family history reviewed for relevance to current acute and chronic problems.  Family History: Reviewed history from 10/18/2008 and no changes required. 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  Has begun disability application. GETTING MEDS VIA SOCIAL SERVICES. Single-long term relationship Quit 03/2010 Alcohol use-no Drug use-no Lives with boyfriend Education: 11th grade  Review of Systems      See HPI General:  Complains of chills and fatigue. Eyes:  Denies blurring and discharge. Heme:  Denies abnormal bruising and bleeding. Allergy:  Complains of seasonal allergies.  Physical Exam  General:  Well-developed,well-nourished,in no acute distress; alert,appropriate and cooperative throughout examination HEENT: No facial asymmetry,  EOMI, No sinus tenderness, TM's Clear, oropharynx  pink and moist.   Chest: Clear to auscultation bilaterally. decreased air entry bilaterally CVS: S1, S2, No  murmurs, No S3.   Abd: Soft, Nontender.  MS: Adequate ROM spine, hips, shoulders and knees.  Ext: No edema.   CNS: CN 2-12 intact, power tone and sensation normal throughout.   Skin: Intact, no visible lesions or rashes.  Psych: Good eye contact, normal affect.  Memory intact, not anxious or depressed appearing.    Impression & Recommendations:  Problem # 1:  DEPRESSION (ICD-311) Assessment Deteriorated  Her updated medication list  for this problem includes:    Valium 5 Mg Tabs (Diazepam) .Marland Kitchen... 1/2 tab twice daily as needed  Discussed treatment options, including trial of antidpressant medication. Will refer to behavioral health. Follow-up call in in 24-48 hours and recheck in 2 weeks, sooner as needed. Patient agrees to call if any worsening of symptoms or thoughts of doing harm arise. Verified that the patient has no suicidal ideation at this time.   Problem # 2:  COUGH (ICD-786.2) Assessment: Unchanged sputum for c/s  Problem # 3:  ANXIETY (ICD-300.00) Assessment: Unchanged  Her updated medication list for this problem includes:    Valium 5 Mg Tabs (Diazepam) .Marland Kitchen... 1/2 tab twice daily as needed  Complete Medication List: 1)  Hydrocodone-acetaminophen 5-500 Mg Tabs (Hydrocodone-acetaminophen) .... One three times a day as needed 2)  Amlodipine Besylate 5 Mg Tabs (Amlodipine besylate) .... One daily 3)  Proair Hfa 108 (90 Base) Mcg/act Aers (Albuterol sulfate) .... Two puffs every 6 hours as needed 4)  Tums  .... As needed 5)  Nexium 20 Mg Cpdr (Esomeprazole magnesium) .... Take 1 tablet by mouth once a day 6)  Valium 5 Mg Tabs (Diazepam) .... 1/2 tab twice daily as needed 7)  Symbicort 160-4.5 Mcg/act Aero (Budesonide-formoterol fumarate) .... Take 2 puffs two times a day. 8)  Accuretic 10-12.5 Mg Tabs (Quinapril-hydrochlorothiazide) .... One tab by mouth once daily 9)  Doxycycline Hyclate 100 Mg Caps (Doxycycline hyclate) .... Take 1 capsule by mouth two times a day 10)  Dukes Magic Mouthwash  .... Rinse one tsp 1-2 mins four times daily  then swallow 11)  Lovastatin 40 Mg Tabs (Lovastatin) .... Take 1 tab by mouth at bedtime  Other Orders: T-Culture, Sputum & Gram Stain ([id number])  Patient Instructions: 1)  Please schedule a follow-up appointment in 4 months. 2)  you need to send a sample of sputum for testing sinceyou continue to have brown sputum and intermittent fever. 3)  pls keep appts with  eNT 4)  you will get appt with mental health for counselling asap 5)  congrats on smoking cessation Prescriptions: LOVASTATIN 40 MG TABS (LOVASTATIN) Take 1 tab by mouth at bedtime  #30 x 4   Entered and Authorized by:   Tula Nakayama MD   Signed by:   Tula Nakayama MD on 05/21/2010   Method used:   Electronically to        [facility]. [phone]* (retail)       [address]       [place], [place]  [address]       Ph: [phone] or [phone]       Fax: [phone]   RxID:   [id number]    Orders Added: 1)  Est. Patient Level IV [19758] 2)  T-Culture, Sputum & Gram Stain [[id number]]

## 2010-06-07 NOTE — Progress Notes (Signed)
Summary: MENTAL HEALTH   Phone Note Call from Patient   Summary of Call: PATIENT CALLED IN WANTED TO KNOW IF DR. Moshe Cipro COULD REFER  HER TO A PSYCHIATRIST OR BEHAVIORAL HEALTH FOR DEPRESSION AND ANXIETY DISCUSSED WITH DAWN WHEN SHE WAS HERE. PLEASE CALL PATIENT BACK AT [phone] Initial call taken by: Tomie China,  April 23, 2010 4:15 PM  Follow-up for Phone Call        pls get symptom details and document , I am happy to refer, anxiety, depression or bot? pls document if suicidal, homicidal or hallucinating and duration of symptoms Follow-up by: Tula Nakayama MD,  April 23, 2010 7:18 PM  Additional Follow-up for Phone Call Additional follow up Details #1::        [place],  FEW MONTHS NO SUICIDAL OR HOMICIDAL, NO HALLUCINATIING Additional Follow-up by: Baldomero Lamy LPN,  April 25, 8315 5:57 PM  New Problems: DEPRESSION (ICD-311)   Additional Follow-up for Phone Call Additional follow up Details #2::    thanks she is being referred Follow-up by: Tula Nakayama MD,  April 24, 2010 6:45 PM  New Problems: DEPRESSION (ICD-311)

## 2010-06-07 NOTE — Progress Notes (Signed)
Summary: results of lab  Phone Note Call from Patient   Summary of Call: pt was in today and forgot to ask about chol.  [phone] Initial call taken by: Lenn Cal,  May 21, 2010 2:47 PM  Follow-up for Phone Call        wants to know do you want to put her on cholesterol meds, she forgot to ask about labs at [facility] today Follow-up by: Baldomero Lamy LPN,  May 21, 7122 3:51 PM  Additional Follow-up for Phone Call Additional follow up Details #1::        Please advise pt the cholesterol is elevated. They need to increase fresh or frozen fruit and vegetable intake, reduce red meats max twice weekly, increase white meat, baked, grilled or broiled eg fish, [place] breast, chicken breast.  Egg white boiled is an excellent source of protein and beans cooked without butter or oil. Work towards exercising at least 5 days/week for 30 minutes each session, to improve the good cholesterol. Cut back on butter, cheese, oils and the yellow of eggs, preferably drink 1% or skimmed milk. The healthy oils are olive and canola, use in moderation; healthiest "butter" substitute is smart balance. Start chol med which i am sending in now, lovastatin 15m 1 at night , will ned rept hepatic and fastting lipid in 3 months pls order and let her know Additional Follow-up by: MTula NakayamaMD,  May 21, 2010 3:55 PM    Additional Follow-up for Phone Call Additional follow up Details #2::    patient aware Follow-up by: JBaldomero LamyLPN,  January 16, 258095:03 PM

## 2010-06-07 NOTE — Progress Notes (Signed)
  Phone Note Other Incoming   Request: Send information Summary of Call: Request received from [employer] of [place] [employer]ley forwarded to [employer].

## 2010-06-07 NOTE — Letter (Signed)
Summary: [facility] STAT[facility]  [facility]   Imported By: Hoy Morn 04/18/2010 16:27:53  _____________________________________________________________________  External Attachment:    Type:   Image     Comment:   External Document

## 2010-06-07 NOTE — Medication Information (Signed)
Summary: Visual merchandiser   Imported By: Dierdre Harness 04/16/2010 08:04:37  _____________________________________________________________________  External Attachment:    Type:   Image     Comment:   External Document

## 2010-06-07 NOTE — Letter (Signed)
Summary: DISIBILITY DETERMINATION  DISIBILITY DETERMINATION   Imported By: Hoy Morn 05/08/2010 16:42:57  _____________________________________________________________________  External Attachment:    Type:   Image     Comment:   External Document

## 2010-06-07 NOTE — Letter (Signed)
Summary: ent  ent   Imported By: Dierdre Harness 05/01/2010 08:57:17  _____________________________________________________________________  External Attachment:    Type:   Image     Comment:   External Document

## 2010-06-07 NOTE — Letter (Signed)
Summary: medicine. colestid 5 mg  medicine. colestid 5 mg   Imported By: Dierdre Harness 05/16/2010 07:52:33  _____________________________________________________________________  External Attachment:    Type:   Image     Comment:   External Document

## 2010-06-07 NOTE — Letter (Signed)
Summary: ent  ent   Imported By: Dierdre Harness 05/23/2010 10:31:10  _____________________________________________________________________  External Attachment:    Type:   Image     Comment:   External Document

## 2010-06-07 NOTE — Progress Notes (Signed)
Summary: lab  Phone Note Call from Patient   Summary of Call: pt is going to do lab work tomorrow and needs to [employer] a indigent form filled out and faxed to lab. [phone] Initial call taken by: Lenn Cal,  May 16, 2010 3:16 PM  Follow-up for Phone Call        Already filled out and faxed to lab  Follow-up by: Kate Sable LPN,  May 19, 139 3:24 PM

## 2010-07-19 ENCOUNTER — Ambulatory Visit (INDEPENDENT_AMBULATORY_CARE_PROVIDER_SITE_OTHER): Payer: Self-pay | Admitting: Otolaryngology

## 2010-07-19 DIAGNOSIS — R490 Dysphonia: Secondary | ICD-10-CM

## 2010-07-25 ENCOUNTER — Other Ambulatory Visit: Payer: Self-pay | Admitting: Family Medicine

## 2010-07-25 ENCOUNTER — Telehealth: Payer: Self-pay | Admitting: *Deleted

## 2010-07-25 DIAGNOSIS — R490 Dysphonia: Secondary | ICD-10-CM

## 2010-07-25 DIAGNOSIS — K219 Gastro-esophageal reflux disease without esophagitis: Secondary | ICD-10-CM

## 2010-07-25 NOTE — Telephone Encounter (Signed)
Ok to increase the dose to twice daily, pls send a new script to the pharmacy with the dose inc and notify the pt, I am also willing to refer to ENT for eval of hoarsemess

## 2010-07-25 NOTE — Telephone Encounter (Signed)
Patient wants to know if Dr can increase her Nexium to bid, also wants to know if she can be referred to a specialist regarding her vocal cords

## 2010-07-27 MED ORDER — ESOMEPRAZOLE MAGNESIUM 20 MG PO CPDR: 20.0000 mg | DELAYED_RELEASE_CAPSULE | Freq: Two times a day (BID) | ORAL | Status: DC

## 2010-07-29 LAB — BLOOD GAS, ARTERIAL
Acid-Base Excess: 0.1 mmol/L (ref 0.0–2.0)
FIO2: 21 %
TCO2: 20.6 mmol/L (ref 0–100)
pCO2 arterial: 37.7 mmHg (ref 35.0–45.0)
pH, Arterial: 7.439 — ABNORMAL HIGH (ref 7.350–7.400)
pO2, Arterial: 68.2 mmHg — ABNORMAL LOW (ref 80.0–100.0)

## 2010-08-09 LAB — BASIC METABOLIC PANEL
Calcium: 10 mg/dL (ref 8.4–10.5)
GFR calc Af Amer: >60 mL/min (ref >60)
GFR calc non Af Amer: >60 mL/min (ref >60)
Glucose, Bld: 86 mg/dL (ref 70–99)
Potassium: 3.5 mEq/L (ref 3.5–5.1)
Sodium: 137 mEq/L (ref 135–145)

## 2010-08-09 LAB — HEMOGLOBIN AND HEMATOCRIT, BLOOD: HCT: 46.5 % — ABNORMAL HIGH (ref 36.0–46.0)

## 2010-08-13 LAB — CREATININE, SERUM
Creatinine, Ser: 0.5 mg/dL (ref 0.4–1.2)
GFR calc Af Amer: >60 mL/min (ref >60)

## 2010-08-15 LAB — CBC
MCHC: 35.5 g/dL (ref 30.0–36.0)
Platelets: 215 10*3/uL (ref 150–400)
RDW: 13.1 % (ref 11.5–15.5)

## 2010-08-15 LAB — BASIC METABOLIC PANEL
BUN: 6 mg/dL (ref 6–23)
CO2: 25 mEq/L (ref 19–32)
Calcium: 10.2 mg/dL (ref 8.4–10.5)
Creatinine, Ser: 0.5 mg/dL (ref 0.4–1.2)
GFR calc non Af Amer: >60 mL/min (ref >60)
Glucose, Bld: 92 mg/dL (ref 70–99)

## 2010-08-21 ENCOUNTER — Telehealth: Payer: Self-pay

## 2010-08-21 NOTE — Telephone Encounter (Signed)
T/C from Cecil at [facility], ask could she refill the Vicodin today (sceduled for 08/23/2010). She said pt has not taken too many, but some months have 31 days and it gets off track. Also, said pt has just found out she has cancer and pharmacist wants to fill if possible. Per pharmacist, pt said she had biopsies of vocal cords and she had abnormal cells. Told her i would speak with extender since Dr. Oneida Alar is off. Sending message to Laban Emperor, NP to address.

## 2010-08-22 NOTE — Telephone Encounter (Signed)
Spoke with pharmacist. She states she will not dispense vicodin to pt if more than 2 days remains. Therefore, it was decided to change the prescription to say with 1 refill, rather than on 19th of month. After much discussion with the pharmacist, this seemed to be the best management plan for pt. She has been taking 3 per day as prescribed, but due to some months have 31 days, this has gotten somewhat off track. Further management to be done by Dr. Oneida Alar regarding Vicodin. This will take pt through May.

## 2010-08-28 NOTE — Telephone Encounter (Signed)
I called pt to set up OV with SF on 09/06/10 at 1030. Pt was very confused.

## 2010-08-28 NOTE — Telephone Encounter (Signed)
Spoke with [employer]. OK change to RFx1 instead of refilling on a specific day. OPV for Ms. Hazan in May or June 2012.

## 2010-08-29 NOTE — Telephone Encounter (Signed)
Pt has appt to see Dr. Oneida Alar on 09/06/2010.

## 2010-09-06 ENCOUNTER — Encounter: Payer: Self-pay | Admitting: Gastroenterology

## 2010-09-06 ENCOUNTER — Ambulatory Visit (INDEPENDENT_AMBULATORY_CARE_PROVIDER_SITE_OTHER): Payer: Medicaid Other | Admitting: Gastroenterology

## 2010-09-06 VITALS — BP 123/84 | HR 94 | Temp 98.6°F | Ht 60.0 in | Wt 123.8 lb

## 2010-09-06 DIAGNOSIS — K589 Irritable bowel syndrome without diarrhea: Secondary | ICD-10-CM

## 2010-09-06 DIAGNOSIS — R109 Unspecified abdominal pain: Secondary | ICD-10-CM

## 2010-09-06 NOTE — Progress Notes (Signed)
  Subjective:    Patient ID: [name] [name], female    DOB: 10/10/1955, 55 y.o.   MRN: [id number]  PCP: [facility] ENT: Sharion Balloon  HPI Still having pain across abd. Comes a goes. Stress makes her Sx worse. Bms: goes at 5 am daily-usu. Loose stools. No blood in stool.  EATING ICE CREAM because it makes her throat feel better. Swallowing thinks half her throat is shut off and all inflammed. 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   Past Medical History  Diagnosis Date  . Elevated liver enzymes 2009 ?Etoh    NEG ANA & ASMA  . Anastomotic ulcer JAN 2009  . Esophageal stricture 2009  . Diarrhea MULITIFACTORIAL    IBS, LACTOSE INTOLERANCE, SBBO, BILE-SALT  . Inflammatory bowel disease 2006 CD    SURGICAL REMISSION  . LBP (low back pain)   . Migraine   . Asthma   . Allergic rhinitis   . CTS (carpal tunnel syndrome)   . Hyperlipemia   . Anxiety    Past Surgical History  Procedure Date  . Colonoscopy 2009  . Hemicolectomy RIGHT 2006  . Hernia repair   . Carpal tunnel release LEFT  . Lumbar disc surgery      Review of Systems DEC 2011 119 LBS    Objective:   Physical Exam  Constitutional: She is oriented to person, place, and time. She appears well-developed and well-nourished. She appears distressed.       Tearful RE ? Dx of vocal cord cancer  HENT:  Head: Normocephalic and atraumatic.  Cardiovascular: Normal rate and regular rhythm.   Pulmonary/Chest: Effort normal and breath sounds normal.  Abdominal: Soft. Bowel sounds are normal. She exhibits distension. There is tenderness. There is no rebound and no guarding.       Mild ttp in ruq  Neurological: She is alert and oriented to person, place, and time.  Skin: Skin is warm and dry.          Assessment & Plan:

## 2010-09-06 NOTE — Progress Notes (Signed)
Pt is aware of her OV on 03/13/11 @ 1045 with SF

## 2010-09-06 NOTE — Assessment & Plan Note (Signed)
RECHECK HFP AFTER NEXT VISIT.

## 2010-09-06 NOTE — Progress Notes (Signed)
Cc to PCP 

## 2010-09-06 NOTE — Assessment & Plan Note (Signed)
Slightly worse 2o to pending Dx of ? VC cancer.  Continue current management for IBS. Will prescribe Vicodin 1 mo supply when current Rx runs out. She uses 3 per day. OPV in 6 mos.

## 2010-09-10 ENCOUNTER — Telehealth: Payer: Self-pay

## 2010-09-10 NOTE — Telephone Encounter (Signed)
Pt left VM on Fri at 1:45 pm that she would like a call from Dr. Oneida Alar at her earliest convenience in reference to her disability. I called her this Am and lmom that we were out of office Friday afternoon, but i have received the message and will forward to Dr. Oneida Alar.

## 2010-09-12 NOTE — Telephone Encounter (Signed)
Spoke with [name] [name]. She requested a higher dose of Vicodin. I said no because she should ask Dr. Otis Dials to give her extra Vicodin for her throat if she needs it. [name] [name] wants a copy of her visit.  Please run off a copy and she may pick it up at the front desk.

## 2010-09-12 NOTE — Telephone Encounter (Signed)
Copy of OV at front for pick-up.

## 2010-09-17 ENCOUNTER — Encounter: Payer: Self-pay | Admitting: Family Medicine

## 2010-09-18 NOTE — Consult Note (Signed)
NAMEFINOLA, [name] [name]                ACCOUNT NO.:  [id number]   MEDICAL RECORD NO.:  [id number]          PATIENT TYPE:  AMB   LOCATION:  DAY                           FACILITY:  [facility]   PHYSICIAN:  Caro Hight, M.D.      DATE OF BIRTH:  10-Jul-1955   DATE OF CONSULTATION:  05/12/2007  DATE OF DISCHARGE:                                 CONSULTATION   PROBLEM LIST:  1. Crohn's disease, with right hemicolectomy and terminal ileum      resection in March 2006.  2. Incisional hernia repair.  3. Anxiety.  4. Hypertension.   SUBJECTIVE:  [name] [name] is 55 year old female who presents as a return  patient visit.  She is complaining of vomiting in the mornings.  She  started on new medicines:  fish oil and calcium and vitamin D.  She  complains of pain in her right side that doubled her over.  She has  intermittently had pain again, but not as severe at that particular  time.  Her stomach gurgles a lot.  If she pushes on her stomach, she  feels better.  She rarely sees blood in her stool.  She has had three  bowel movements this morning.  Sometimes, she has a normal bowel  movement.  At other times, she has intermittent diarrhea and normal  stool.  She tried taking two Tums with meals, but it did not seem to  make any difference with her bowel movements.  It does help with her  indigestion.  She is having difficulty swallowing large pills.  She  points out the vitamins and the antibiotics were difficult to swallow.  She does complain of occasional heartburn and indigestion.  She smokes  one pack of cigarettes every 2-3 days since the age of 20.  She does  consume sour cream, cheese, cottage cheese, and yogurt.  Milk causes her  to have abdominal pain and stay on the toilet all day.   MEDICATIONS:  1. Vicodin 5/500, 3 daily.  2. Aspirin 81 mg daily.  3. Multivitamin.  4. Inhaler.  5. Vitamin C.  6. Lotensin.  7. Xanax.  8. Heart medicine.  9. Fish oil.  10.Calcium and vitamin D.  11.Phillips' Colon Health daily.   OBJECTIVE:  VITAL SIGNS:  Weight 124 pounds (up 3 pounds since September  2008), height 5 feet, BMI 24.2 (healthy).  Temperature 98.6, blood  pressure 122/80, pulse 72.  GENERAL:  She is in no apparent distress, alert and oriented x4.  '  HEENT:  Atraumatic, normocephalic.  Pupils equal and reactive to light.  Mouth:  No oral lesions.  Posterior pharynx without erythema or exudate.  NECK:  Has full range of motion.  No lymphadenopathy.  LUNGS:  Clear to auscultation bilaterally.  CARDIOVASCULAR:  Regular rhythm.  No murmur.  Normal S1 and S2.  ABDOMEN:  Bowel sounds are present, soft, nontender, nondistended.  No  rebound or guarding.  NEUROLOGIC:  No focal neurologic deficits.   ASSESSMENT:  [name] [name] is a 55 year old female who has had a history of  Crohn's disease requiring  surgical resection in 2006.  She now presents  with worsening abdominal pain, intermittent diarrhea, and blood in her  stool.  She is currently not on any therapy to manage Crohn's disease.  She has never had a colonoscopy. Her diarrhea and abdominal pain could  also be due to bile salt-induced diarrhea, celiac sprue.  Her difficulty  swallowing may be due to nonspecific esophageal motility disorder, non-  ulcer dyspepsia, gastroesophageal reflux disease with erosive  esophagitis, and a low likelihood of gastritis or stricture.   Thank you for allowing me to see [name] [name] in consultation.  My  recommendations follow.   RECOMMENDATIONS:  1. Will check a CBC and a C-reactive protein today.  2. She will be scheduled for a colonoscopy to evaluate the rectal      bleeding and for evidence of active Crohn's colitis or Crohn's      ileocolitis and an EGD to evaluate her dysphagia.  3. I did give her a prescription for hydrocodone 7.5/500, 1 p.o. q.4-6      h. as needed for pain, #20, with no refills.  4. She has a follow-up appointment to see me in 6 weeks.      Caro Hight,  M.D.  Electronically Signed     SM/MEDQ  D:  05/12/2007  T:  05/13/2007  Job:  [id number]   cc:   Weston Settle, M.D.

## 2010-09-18 NOTE — Assessment & Plan Note (Signed)
NAME:  TEDRA, COPPERNOLL                 CHART#:  [id number]   DATE:  01/29/2007                       DOB:  Sep 12, 1955   REFERRING PHYSICIAN:  Weston Settle.   PROBLEM LIST:  1. Crohn's disease with right hemicolectomy and terminal ileum      resection in March 2006.  2. Incisional hernia repair.  3. Anxiety.  4. Hypertension.   SUBJECTIVE:  The patient is a 55 year old female who presents as a  return patient visit.  She does not have any problems with her bowels  unless she eats the wrong thing.  If she does eat the wrong thing then  she has stomach pain, and then subsequently has to run to the bathroom  and has watery stool.  Tums has helped with those symptoms.  Sometimes  she can tolerate eggs and sometimes cannot.  She does eat [place] or Activia daily.  She is able to tolerate cheese and  cottage cheese but not regular milk.   MEDICATIONS:  1. Vicodin.  2. Aspirin.  3. Multivitamin.  4. Inhaler as needed.  5. Vitamin C.  6. Lotensin/hydrochlorothiazide.  7. Xanax.  8. New blood pressure pill.   OBJECTIVE:  Weight 121 pounds (down 2 pounds since March 2008), height 5  feet, temperature 99, blood pressure 118/68, pulse 88.  IN GENERAL:  She is in no apparent distress.  LUNGS:  Clear to auscultation bilaterally.  CARDIOVASCULAR EXAM:  Regular rhythm, no murmur, normal S1 and S2.  ABDOMEN:  Bowel sounds are present, soft, nontender, nondistended, no  rebound or guarding.   ASSESSMENT:  The patient is a 55 year old female who has achieved  surgical remission for Crohn's disease.  She is currently asymptomatic.  Thank you for allowing me to see the patient in consultation, my  recommendations follow.   RECOMMENDATIONS:  1. She is to continue to use 2 Tums before meals.  She should also      consider hydrogen breath test to evaluate for bacterial overgrowth      since she no longer has an ileocecal valve.  It remains in the      differential  diagnosis for her post prandial diarrhea.  2. She has a follow up appointment to see me in 12 months, and I will      check a B12 level at that time.       Caro Hight, M.D.  Electronically Signed     SM/MEDQ  D:  01/29/2007  T:  01/30/2007  Job:  [id number]   cc:   Weston Settle, M.D.

## 2010-09-18 NOTE — Assessment & Plan Note (Signed)
NAMEMarland Kitchen  JALINA, BLOWERS                 CHART#:  [id number]   DATE:  03/29/2008                       DOB:  1955-10-20   PROBLEM LIST:  1. Anastomotic ulcer on colonoscopy in January 2009.  2. Crohn disease, chronic right hemicolectomy, and terminal ileum      resection in March 2006.  3. Esophageal dilation to 16 mm in January 2009.  4. Gastritis.  5. Intermittent postprandial loose stools likely secondary to bile      salt-induced diarrhea with lactose intolerance.  6. Incisional hernia repair.  7. Anxiety.  8. Hypertension.  9. Chronic abdominal pain and back pain.   SUBJECTIVE:  The patient is a 55 year old female who was last seen in  August 2009.  She reports approximately a month ago, she had sudden  onset of right upper quadrant pain.  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  Symptoms lasted 1 minute.  She was walking with the dog and  it hit her again in the right upper quadrant.  The pain was so severe,  it doubled her over.  It is associated with bloating.  The pain is  usually worse with movement.  She had worsening pain when she lift her  arms.  She was taking Vicodin, but  it was not helping. So she saw Dr.  Jonna Munro and he ordered chest x-ray and ultrasound.  She reports having  a gallbladder infection.  The right upper quadrant ultrasound report  was reviewed and it showed a normal exam.  She reports feeling  discomfort that moves right under her ribs from right to left.  It is  off and on.  The discomfort seems to be worse with straining or  exercise.  It does not seem to be related to food.  As far as her  Crohn's disease is concerned, her bowel movements are fine.  She  denies any blood in her stool.  She is not up to have bowel movements in  the middle of the night.  Her appetite is better.  She was put on  omeprazole for pain and it caused her to have chest pain and chest  tightness; so itwas discontinued.  She is now having 1 bowel movement a  day with  tea and honey.  The symptoms of pain seem to correlate with her  reintroduction of milk and eggs.  She was able to tolerate grape juice  in [place], which she was not able to do before.  She is also being currently  managed for a respiratory infection with doxycycline and prednisone.   MEDICATIONS:  1. Vicodin as needed.  2. Vitamin C.  3. Albuterol.  4. Norvasc.  5. Xanax.  6. Protonix.  7. Lotensin.  8. Questran 4 g daily.  9. Prednisone half twice a day.  10.Doxycycline b.i.d.   OBJECTIVE:  VITAL SIGNS:  Weight 124 pounds (up 3 pounds since August  2009), height 5 feet, temperature 96, blood pressure 120/80, and pulse  80.  GENERAL:  She is in no apparent distress.  Alert and oriented x4.HEENT:  Atraumatic, normocephalic.  Pupils equal and react to light.  Mouth, no  oral lesions.  Posterior pharynx without erythema or exudate.LUNGS:  Inspiratory wheezes bilaterally with fair air movement, not clear to  auscultation bilaterally.CARDIOVASCULAR:  Regular rhythm.  No murmur.  Normal S1 and S2.ABDOMEN:  Bowel sounds are present, soft, nontender,  nondistended.  No rebound or guarding.  EXTREMITIES:  No cyanosis or edema.NEUROLOGIC:  She has no focal  neurologic deficits.   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  She did have a mildly elevated ALT in June 2009, it was 42  and her upper limit of normal is 35.  She did reintroduce lactose and  fat into her diet and became symptomatic.  Thank you for allowing me to  see the patient in consultation.  My recommendations follow.   RECOMMENDATIONS:  1. No new medication to introduce for the management of her Crohn's      disease.  If her right upper quadrant pain persists, then could      consider repeat CT scan or capsule endoscopy to see if there is any      ulcers in the small intestines as an etiology for her pain.  2. She can  continue Vicodin.  She is given a prescription for 5/500      one every 6-8 hours as needed for pain, #90 with the refill x3.      Her next prescription will be refilled in April 2010.  3. She is asked to strictly avoid eggs and milk products.  4. Will obtain labs and notes from Dr. Jonna Munro.  If her labs, suggest      a biliary etiology for elevated liver enzymes, then could consider      an elective lap cholecystectomy.  5. Follow up appointment in 4 months.        Caro Hight, M.D.  Electronically Signed     SM/MEDQ  D:  03/29/2008  T:  03/29/2008  Job:  [id number]   cc:   Weston Settle, M.D.

## 2010-09-18 NOTE — Op Note (Signed)
NAMECALINA, [name] [name]                ACCOUNT NO.:  [id number]   MEDICAL RECORD NO.:  [id number]          PATIENT TYPE:  AMB   LOCATION:  DAY                           FACILITY:  [facility]   PHYSICIAN:  Caro Hight, M.D.      DATE OF BIRTH:  Jul 04, 1955   DATE OF PROCEDURE:  05/15/2007  DATE OF DISCHARGE:                               OPERATIVE REPORT   PROCEDURES:  1. Colonoscopy with cold forceps biopsy.  2. Esophagogastroduodenoscopy with cold forceps biopsy and Savary      dilation.   INDICATION FOR EXAM:  [name] [name] is a 55 year old female who has a  history of Crohn's ileitis requiring ileocecal resection in 2006.  She  was not maintained on any therapy for her Crohn's disease.  She  complains of intermittent diarrhea and worsening abdominal pain.  She  also complained of blood in her stool.   FINDINGS:  1. A 1 cm ulcerated lesions seen at the anastomosis.  A 3 mm ulcerated      lesions seen at the anastomosis.  Biopsies obtained via cold      forceps.  2. The neoterminal ileum is normal.  Otherwise, no polyps, masses,      inflammatory changes, diverticula or AVMs seen.  Biopsies obtained      randomly in the colon to evaluate for microscopic colitis.  3. Small internal hemorrhoids.  Otherwise normal retroflexed view of      the rectum.  4. Distal esophageal ring.  Dilated to 16 mm with the Savary dilator.      Otherwise, no evidence of Barrett's, mass, erosion, ulceration or      stricture.  5. Diffuse erythema of the body and antral with occasional erosions in      the antrum.  Biopsies obtained via cold forceps to evaluate for H.      pylori gastritis.  6. Normal duodenal bulb and second portion of the duodenum.   DIAGNOSES:  1. The ulcerated lesions at the anastomosis would not likely account      for her diarrhea or increased abdominal pain.  Biopsies are      pending.  2. Gastritis may be contributing to her increased abdominal pain.  3. Dysphagia likely related to  distal esophageal ring.   RECOMMENDATIONS:  1. Will call [name] [name] with the results of her biopsies.  If she has      evidence of Crohn's recurrence at the anastomosis, then will      initiate therapy.  2. Will call with the results of her gastric biopsies and if evidence      of H. pylori gastritis, then will treat.  She also could be treated      for microscopic colitis if her colonic biopsies are positive.  3. No aspirin, NSAIDs or anticoagulation for seven days.  4. She should follow high fiber diet.  She is given handout on a high-      fiber diet.  She is also given handout on hemorrhoids as well as      gastritis.  5. She should begin her  high-fiber diet after a period of clear      liquids for 12 hours.  6. Begin Prilosec 20 mg tablets daily.  7. Screening colonoscopy in 8 years.  8. She already has a follow-up appointment to see me in six weeks.   MEDICATIONS:  1. Demerol 175 mg IV.  2. Versed 10 mg IV.  3. Phenergan 25 mg IV.   PROCEDURE TECHNIQUE:  Physical exam was performed.  Informed consent was  obtained from the patient after explaining the benefits, risks and  alternatives to the procedure.  The patient was connected to the monitor  and placed in the left lateral position.  Continuous oxygen was provided  by nasal cannula and IV medicine administered through an indwelling  cannula.  After administration of sedation and rectal exam, the  patient's rectum was intubated and the scope was advanced under direct  visualization to the neoterminal ileum.  The scope was removed slowly by  carefully examining the color, texture, anatomy and integrity of the  mucosa on the way out.   After the colonoscopy, the patient's esophagus was intubated with a  diagnostic gastroscope and the scope was advanced under direct  visualization to the second portion of the duodenum.  The scope was  removed slowly by carefully examining the color, texture, anatomy and  integrity of the  mucosa on the way out.  Prior to withdrawing the scope,  the Savary guidewire was introduced.  Upon withdrawing the scope, the  wire fell out.  The diagnostic gastroscope was then reintroduced and the  Savary guidewire was successfully passed.  The esophagus was dilated  successively use the 15 mm and 16 mm dilator.  The 16 mm dilator passed  with mild resistance.  The patient was recovered in endoscopy and  discharged home in satisfactory condition      Caro Hight, M.D.  Electronically Signed     SM/MEDQ  D:  05/15/2007  T:  05/15/2007  Job:  [id number]   cc:   Weston Settle, M.D.

## 2010-09-18 NOTE — Assessment & Plan Note (Signed)
NAME:  [name] [name], [name] [name]                 CHART#:  [id number]   DATE:  09/10/2007                       DOB:  1955/11/27   REFERRING PHYSICIAN:  Weston Settle, M.D.   PROBLEM LIST:  1. Colonoscopy in January 2009 which showed an anastomotic ulcer.  2. Esophageal dilation, 16 mm, in January 2009.  3. Gastritis.  4. Postprandial loose stool is likely secondary to bile salt induced      diarrhea.  5. Crohn's disease requiring a right hemicolectomy and terminal ileum      resection in March 2006.  6. Incisional hernia repair.  7. Anxiety.  8. Hypertension.  9. Chronic abdominal pain.   SUBJECTIVE:  The patient is a 55 year old female who has had a history  of Crohn's disease requiring resection.  She is now having less diarrhea  with the addition of Questran daily.  She does occasionally get pain in  her side when she is walking her dog and after exercise.  She is  currently participating in physical therapy for the last 3 weeks due to  neck and back pain.  She has 1 to 5 bowel movements a day and they are  primarily solid.   MEDICATIONS:  Vicodin t.i.d., multivitamins, vitamin C, Lotensin, Xanax,  Norvasc, calcium and the months of colon health once a day for Prilosec  daily, Questran daily, and Flexeril as needed.  Skelaxin as needed.   ALLERGIES:  FLEXERIL.   OBJECTIVE:  VITAL SIGNS:  Weight 120 pounds (down 3-1/2 pounds since  February 2009), height 5 feet, temperature 99.1, blood pressure 100/78,  pulse 64.  GENERAL:  She is in no apparent distress.  Alert and oriented  x4.  LUNGS:  Clear to auscultation bilaterally.  CARDIOVASCULAR:  Regular rhythm.  ABDOMEN:  Bowel sounds present.  Soft, nontender,  nondistended.   ASSESSMENT:  The patient is a 55 year old female who had Crohn's disease  diagnosed in 2006 requiring resection.  She has achieved a surgical  remission.  She did have an anastomotic ulcer which is likely ischemic  in nature.  She has occult diarrhea which is  now improved after the  addition of Questran.   Thank you for allowing me to see Ms. [facility] consultation.  My  recommendations follow.   RECOMMENDATIONS:  1. She should continue the Questran, Prilosec and [employer]' [place].  2. She has a follow up appointment to see me in 3-4 months.       Caro Hight, M.D.  Electronically Signed     SM/MEDQ  D:  09/11/2007  T:  09/11/2007  Job:  [id number]   cc:   Weston Settle, M.D.

## 2010-09-18 NOTE — Assessment & Plan Note (Signed)
NAME:  [name] [name]                 CHART#:  [id number]   DATE:  12/16/2007                       DOB:  [birth year]   REFERRING PHYSICIAN:  Weston Settle, MD   PROBLEM LIST:  1. Colonoscopy in January 2009, which showed an anastomotic ulcer.  2. Crohn disease requiring a right hemicolectomy and terminal ileum      resection in March 2006.  3. Esophageal dilation to 16 mm in January 2009.  4. Gastritis.  5. Intermittent postprandial loose stools likely secondary to bile      salt-induced diarrhea or lactose intolerance.  6. Incisional hernia repair.  7. Anxiety.  8. Hypertension.  9. Chronic abdominal and back pain.   SUBJECTIVE:  The patient is a 55 year old female who presents as a  return patient visit.  She reports drinking Activia, which made her feel  sluggish and her eyes watering and sneezing.  She was coughing, got  choked, and gagged and vomited approximately 2 days ago.  She states her  bowels are good.  Sometimes, she has one a day.  Sometimes, she has 3-5.  Sometimes, she passes gas, and sometimes, she is a little constipated  then it turns to diarrhea.  Her bowel consistency depends on what she  eats.  Milk and egg definitely cause cramps and her stomach to hurt for  3 days.  She is able to tolerate yogurt, cottage cheese, and cheese.  She denies any fever, sore throat, or abdominal pain.  Her appetite was  not good last week, but today, it is better.  She does not have any  insurance.   MEDICATIONS:  1. Vicodin 5/500 three a day.  2. Multivitamin.  3. Inhaler.  4. Vitamin C.  5. Lotensin.  6. Xanax.  7. Norvasc.  [place].  9. Questran 4 g daily.  10.Tums.   OBJECTIVE:   PHYSICAL EXAMINATION:  VITAL SIGNS:  Weight 121 pounds (unchanged since  May 2009), height 5 feet, temperature 98.2, blood pressure 130/88, and  pulse 68.  GENERAL:  She is in no apparent distress.  Alert and oriented x4.LUNGS:  Clear to auscultation  bilaterally.CARDIOVASCULAR:  Regular rhythm.  No  murmur.  ABDOMEN:  Bowel sounds are present.  Soft, nontender, and nondistended.   ASSESSMENT:  The patient is a 55 year old female who presents as a  return patient visit.  She has some upper respiratory symptoms, which  seem to be allergic in nature.  These could be seasonal allergies or a  reaction toward the Activia that she is consuming.  She requires chronic  use of narcotics. Thank you for allowing me to see the patient in  consultation.  My recommendations follow.   RECOMMENDATIONS:  1. Vicodin 5/500, #90, 1 p.o. every 6-8 hours as needed for pain,      refill x3.  Her next prescription will be due April 16, 2008.  2. She appears to have achieved a surgical remission and has no      evidence of active disease.  She should continue the Questran for      her bile-salt induced diarrhea.  3. Follow up appointment in 4 months.       Caro Hight, M.D.  Electronically Signed     SM/MEDQ  D:  12/16/2007  T:  12/17/2007  Job:  [id number]  cc:   Weston Settle, M.D.

## 2010-09-18 NOTE — Assessment & Plan Note (Signed)
NAME:  [name] [name], [name] [name]                 CHART#:  [id number]   DATE:  06/24/2007                       DOB:  19-Oct-1955   REFERRING PHYSICIAN:  Weston Settle, M.D.   PROBLEM LIST:  1. Colonoscopy in January 2009 which showed an anastomotic ulcer.  2. Esophageal dilation to 16 mm in January, 2009.  3. Gastritis.  4. Postprandial loose stools, likely secondary to bile-salt induced      diarrhea.  5. Crohn's disease with right hemicolectomy and terminal ileum      resection in March of 2006.  6. Incisional hernia repair.  7. Anxiety.  8. Hypertension.  9. Chronic abdominal pain.   SUBJECTIVE:  [name] [name] is a 55 year old female who had a right  hemicolectomy and ileal resection due to Crohn's disease.  She presented  in January 2009 complaining of diarrhea and worsening abdominal pain and  blood in her stools.  The anastomotic ulcer is likely the source of her  blood in her stool.  She is chronically maintained on hydrocodone and  had a pain contract with [name] [name] and received hydrocodone at  7.5/500 from me in January 2009 when her abdominal pain was getting  worse.  She did not notify [name] [name] that she had these medications  and went to her appointment and the higher dose of hydrocodone in with  her medications and so [name] [name] said that he would no longer  prescribe for Vicodin.  She presents to discuss her abdominal pain and  whether or not she needs long term narcotic therapy.  She reports she  has been on Percocet in the past as well 7.5 which she voluntarily  reduced in potency to hydrocodone.  She uses three hydrocodone a day for  her abdominal pain which usually occurs after eating.  She is currently  unemployed but taking computer classes and going to volunteer at the  hospital.  She has bowel movements varying from diarrhea to normal  stools.   MEDICATIONS:  1. Multivitamin.  2. Vitamin C.  3. Lotensin.  4. Xanax.  5. Norvasc.  6. Calcium.  [place].  8. Prilosec daily.   PHYSICAL EXAMINATION:  VITAL SIGNS:  Weight 123.5 pounds (unchanged over  the last month).  Height 5 feet.  Temperature 98.9, blood pressure  110/76, pulse 86.  GENERAL:  She is in no apparent distress.  She is alert and oriented x4.  LUNGS:  Clear to auscultation bilaterally.CARDIOVASCULAR:  Regular  rhythm, no murmur.ABDOMEN:  Bowel sounds are present, soft, nontender,  nondistended with no rebound or guarding.NEUROLOGICAL:  She has no focal  neurological deficits.   ASSESSMENT:  [name] [name] is a 55 year old female who continues to have  postprandial diarrhea.  It appears to be unpredictable as to when it  will occur.  She was tried on Tums with no relief.  She was also tried  on antibiotics and she did not feel it made any difference.  The  antibiotics were prescribed for a positive hydrogen breath test  consistent with small bowel bacterial overgrowth.  She uses three  Vicodin a day, 5/500 which is actually  not a high dose of narcotic.  She did violate the pain contract terms per [name] [name] and understands  why he will no longer prescribe the narcotics.  She really  does not have  any alternatives besides regular Tylenol for pain relief because I do  not recommend patient's who have history of inflammatory bowel disease  using anti-inflammatory drugs and as well, she has the anastomotic  ulcer.   Thank you for allowing me to see [name] [name] in consultation.  My  recommendations follow.   RECOMMENDATIONS:  1. Will prescribe [name] [name] the Vicodin with refills based on her      current use, which is three daily.  I do not intend to increase the      amount, unless she has objective evidence of acute intra-abdominal      pathology.  2. Will add cholestyramine 4 gram packets, one p.o. daily for two      weeks and if she still has had no improvement in her symptoms,      would consider a course of Augmentin.  3. She has a follow up appointment  to see me in August 2009.       Caro Hight, M.D.  Electronically Signed     SM/MEDQ  D:  06/24/2007  T:  06/25/2007  Job:  [id number]   cc:   Weston Settle, M.D.

## 2010-09-18 NOTE — Op Note (Signed)
NAME:  [name] [name], [name] [name] NO.:  [id number]   MEDICAL RECORD NO.:  [id number]          PATIENT TYPE:  OIB   LOCATION:  2876                         FACILITY:  [facility]   PHYSICIAN:  Ophelia Charter, M.D.DATE OF BIRTH:  12-Jan-1956   DATE OF PROCEDURE:  08/25/2008  DATE OF DISCHARGE:                               OPERATIVE REPORT   BRIEF HISTORY:  The patient is a 55 year old white female, who has  suffered from back and severe left leg pain consistent with left S1  radiculopathy.  She failed medical management, has worked up with a  lumbar MRI, which demonstrated a large herniated disk at L5-S1on the  left.  I discussed various treatment options with the patient including  surgery.  She has weighed the risks, benefits, and alternatives of  surgery,  and decided to proceed with the left L5-S1 diskectomy.   PREOPERATIVE DIAGNOSES:  Left L5-S1 herniated nucleus pulposus, spinal  stenosis, and lumbar radiculopathy with lumbago.   POSTOPERATIVE DIAGNOSES:  Left L5-S1 herniated nucleus pulposus, spinal  stenosis, and lumbar radiculopathy with lumbago.   PROCEDURE:  Left L5-S1 diskectomy using microdissection.   SURGEON:  Ophelia Charter, MD   ASSISTANT:  Hosie Spangle, MD   ANESTHESIA:  General endotracheal.   ESTIMATED BLOOD LOSS:  50 mL.   SPECIMENS:  None.   DRAINS:  None.   COMPLICATIONS:  None.   PROCEDURE:  The patient was brought to the operating room by anesthesia  team.  General endotracheal anesthesia was induced.  The patient was  turned to the prone position on Wilson frame.  Her lumbosacral region  was then prepared with Betadine scrub and Betadine solution.  Sterile  drapes were applied and then injected the area to be incised with  Marcaine with epinephrine solution.  I used a scalpel to make a linear  midline incision over the L5-S1 interspace.  I used electrocautery to  perform a left-sided subperiosteal dissection exposing left  spinous  process and lamina of L5 and the upper sacrum.  We obtained  intraoperative radiograph to confirm our location.  We inserted the  [facility] retractor for exposure.   We then used a high-speed drill to perform a left L5 laminotomy.  We  widened the laminotomy with Kerrison punch and removed the left L5-S1  ligamentum flavum.  We performed a foraminotomy about the left S1 nerve  root.   We then brought the operative microscope into the field and under its  magnification illumination, we completed the  microdissection/decompression.  I used microdissection to free up the  thecal sac and left S1 nerve root from epidural tissue.  Dr. Sherwood Gambler  then gently retracted the thecal sac and S1 nerve root medially with  D'Errico retractor.  This exposed a large free fragment disk herniation.  We removed in multiple fragments using the pituitary forceps.  We then  inspected the intervertebral disk.  There was a hole in the annulus, but  there did not appear to be any pending herniations.  We therefore did  not enter into the intervertebral disk space.  We used  ossified tool to  remove some redundant ligament from the vertebral endplates at L5 and S1  to further decompress the thecal sac and S1 nerve root.  We then  palpated along the ventral surface of the thecal sac along the exit  route of left S1 nerve root and noted neural structures were well  decompressed.  We then obtained hemostasis using bipolar electrocautery.  We irrigated the wound out with bacitracin solution.  We then removed  the retractor and then reapproximated the patient's thoracolumbar fascia  with interrupted #1 Vicryl suture.  The subcutaneous tissue with  interrupted 2-0 Vicryl suture and skin with Steri-Strips and benzoin.  The wound was then coated with bacitracin ointment.  Sterile dressing  was applied.  Drapes were removed.  The patient was subsequently  returned to supine position where she was extubated by  anesthesia team  and transported to [facility] in stable condition.  All  sponge, instrument, and needle counts were correct at the end of the  case.      Ophelia Charter, M.D.  Electronically Signed     JDJ/MEDQ  D:  08/25/2008  T:  08/26/2008  Job:  [id number]

## 2010-09-18 NOTE — Op Note (Signed)
NAMETONJUA, ROSSETTI                ACCOUNT NO.:  [id number]   MEDICAL RECORD NO.:  [id number]          PATIENT TYPE:  AMB   LOCATION:  DAY                           FACILITY:  [facility]   PHYSICIAN:  Caro Hight, M.D.      DATE OF BIRTH:  26-Mar-1956   DATE OF PROCEDURE:  03/06/2007  DATE OF DISCHARGE:  03/06/2007                               OPERATIVE REPORT   PROCEDURE:  Hydrogen breath test.   REFERRING PHYSICIAN:  Weston Settle, M.D.   INDICATIONS:  [name] [name] is a 55 year old female who has a history of  Crohn's disease requiring right hemicolectomy and terminal ileum  resection in March 2006.  She continues to have a food intolerance,  especially to eggs.  She cannot tolerate a regular milk.  She frequently  has diarrhea as a manifestation of her food intolerance.   PRETEST CHECK:  Cigarette smoking in the past 2 hours:  Yes.  Antibiotics now or within the last few weeks:  No.  Diabetes:  No  Region bowel prep.  No  Use of laxatives or  antidiarrheals:  No.  Prior GI surgery:  Yes.  Test sugar:  Lactulose (10 grams per 15 mL) 37.5 mL ingested.  Last meal:  6:37 p.m. on October 30.   RESULTS:  At 30 minutes the breathalyzer read 30 parts per million.  A  plateau occurred between 75 and 90 minutes with a reading of 41 parts  per million.  The breath analyzer read a maximum of 58 parts per million  at a 150 minutes.  At 180 minutes, the breath analyzer read 50 parts per  million.   IMPRESSION:  Hydrogen level rise consistent with small bowel bacterial  overgrowth.   RECOMMENDATIONS:  We will add antibiotics monthly to treat small bowel  bacterial overgrowth.  The patient warned that if diarrhea worsens, to  contact the office to be evaluated to be evaluated for Clostridium  difficile colitis.      Caro Hight, M.D.  Electronically Signed     SM/MEDQ  D:  04/01/2007  T:  04/01/2007  Job:  [id number]   cc:   Weston Settle, M.D.

## 2010-09-20 HISTORY — PX: OTHER SURGICAL HISTORY: SHX169

## 2010-09-21 NOTE — H&P (Signed)
[name] [name], [name] [name]                ACCOUNT NO.:  [id number]   MEDICAL RECORD NO.:  [id number]          PATIENT TYPE:  AMB   LOCATION:  DAY                           FACILITY:  [facility]   PHYSICIAN:  Leane Para C. Tamala Julian, M.D.   DATE OF BIRTH:  04/23/1956   DATE OF ADMISSION:  DATE OF DISCHARGE:  [place]                                HISTORY & PHYSICAL   A 55 year old female with history of Crohn's disease who presented in March  2006 with multiple intramural abscesses.  She underwent right colectomy with  terminal ileal resection and primary ileoascending colon colostomy.  She did  very well until early September when she noted that she was having  enlargement of her lower incision.  She did not have pain. Over the last few  months, the enlargement in her lower abdomen has been progressively worse.  She has been advised to restrict her lifting, but she has a job which  requires lifting be done.  She initially was scheduled to have surgery in  February, but because of increasing size, it was decided to proceed with  incisional hernia repair at this time.  She may need to have matrix mesh  graft placed, but if her fascia is good, it will be worth giving her a trial  at primary closure since she has not had a previous hernia.   PAST MEDICAL HISTORY:  Other than Crohn's disease, she has hypertension,  osteoarthritis, and mild anemia.  The only surgery was right colectomy with  terminal ilium resection and primary ileocolic anastomosis.   MEDICATIONS:  1.  Vicodin 5/500 four times a day.  2.  Combivent inhaler 2 puffs 4 times a day.  3.  Atacand 32 mg daily.  4.  Mobic 7.5 mg daily.  5.  Xanax 0.5 mg twice daily.   SOCIAL HISTORY:  She is single.  She is employed at [employer].   PHYSICAL EXAMINATION:  VITAL SIGNS:  Blood pressure 116/71, pulse 86,  respirations 20, weight 111 pounds.  HEENT:  Unremarkable.  NECK:  Supple.  No JVD, bruit, adenopathy, or thyromegaly.  CHEST: Clear to  auscultation.  HEART: Regular rate and rhythm without murmur, gallop, or rub.  ABDOMEN: Large developing lower midline incisional hernia palpable defect  easily reducible, no evidence of incarceration.  No other masses.  EXTREMITIES:  No cyanosis, clubbing, or edema.  NEUROLOGIC: No focal motor, sensory, or cerebellar deficits.   IMPRESSION:  1.  Incisional hernia.  2.  Crohn's disease.  3.  Hypertension.  4.  Osteoarthritis.   PLAN:  The patient will have primary incisional hernia repair, or we will  use matrix mesh graft for repair.      Vernon Prey. Tamala Julian, M.D.  Electronically Signed     LCS/MEDQ  D:  05/20/2005  T:  05/20/2005  Job:  [id number]

## 2010-09-21 NOTE — Discharge Summary (Signed)
[name] [name], [name] [name]                ACCOUNT NO.:  [id number]   MEDICAL RECORD NO.:  [id number]          PATIENT TYPE:  INP   LOCATION:  A418                          FACILITY:  [facility]   PHYSICIAN:  Vernon Prey. Tamala Julian, M.D.   DATE OF BIRTH:  1956-02-23   DATE OF ADMISSION:  05/21/2005  DATE OF DISCHARGE:  01/18/2007LH                                 DISCHARGE SUMMARY   DISCHARGE DIAGNOSIS:  1.  Incisional hernia.  2.  History of Crohn' disease.  3.  Hypertension.  4.  Osteoarthritis.   SPECIAL PROCEDURE:  Incisional hernia repair   DISPOSITION:  The patient discharged home in stable satisfactory condition.   DISCHARGE MEDICATIONS:  1.  Oxycodone 5/325 two tablets q.4 h. as needed for pain.  2.  Atacand HCT 32/12.5 once daily.  3.  Xanax 0.5 mg q.h.s.   FOLLOWUP:  The patient is scheduled to be seen in the office in 2 weeks.   SUMMARY:  A 55 year old female with a history of Crohn's disease who  presented in March 2006 with multiple intramural abscesses.  She underwent a  right colectomy with terminal ileal resection and primary ileal ascending  colon colostomy. She did very well until September when she was noted to  have enlargement of her lower abdominal incision. Over the past few months  the incision has gotten progressively larger. It was decided to proceed with  an incisional hernia repair.   Past history as given in the admission note. The patient underwent  incisional hernia repair on January 16. She was found to have separation of  her lower midline fascia with her entire incision been involved. There was a  medium sized hernia defect. This was closed primarily since she had very  good fascia; and the patient was off steroids. She did well postoperatively.  She had good return of intestinal function and was discharged home on the  second postoperative day in satisfactory condition.      Vernon Prey. Tamala Julian, M.D.  Electronically Signed    LCS/MEDQ  D:  06/25/2005  T:   06/26/2005  Job:  [id number]

## 2010-09-21 NOTE — Op Note (Signed)
[name] [name], [name] [name]                ACCOUNT NO.:  [id number]   MEDICAL RECORD NO.:  [id number]          PATIENT TYPE:  INP   LOCATION:  A330                          FACILITY:  [facility]   PHYSICIAN:  Vernon Prey. Tamala Julian, M.D.   DATE OF BIRTH:  05-Jan-1956   DATE OF PROCEDURE:  07/24/2004  DATE OF DISCHARGE:                                 OPERATIVE REPORT   PREOPERATIVE DIAGNOSIS:  Crohn's disease with multiple intramural abscesses.   POSTOPERATIVE DIAGNOSIS:  Crohn's disease with multiple intramural  abscesses.   PROCEDURE:  Right colectomy with terminal ileal resection with primary ileal  ascending colostomy.   SURGEON:  Vernon Prey. Tamala Julian, M.D.   DESCRIPTION:  Under general anesthesia the patient's abdomen was prepped and  draped in a sterile field. A lower midline incision was made. Upon entering  the abdomen there was a small amount of purulent pelvic fluid. Inspection of  the bowel and mesentery revealed major inflammatory process with thickening  of the terminal ileum with secondary inflammation of the right colon with  abscess extending into the mesentery of the terminal ileum with an apparent  perforation of the mesenteric abscess. All of this appeared to be self-  contained. There was no fluid in the upper abdomen. The liver and  gallbladder were unremarkable. Stomach was unremarkable. Pancreas felt  normal.  Transverse, descending, sigmoid colon, and rectum were normal.  Examination of the pelvis revealed very rudimentary, atrophic ovaries with a  small uterus with some thickening of the wall of the uterus but without any  intramural leiomyomas palpated.  Dissection was started by mobilizing the  right colon. The right colon was mobilized up to the hepatic flexure. The  terminal ileum was then dissected.  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  The bowel where the Meckel's  diverticulum originated appeared to be clinically normal thought it  was  dilated. The wall did not appear to be thickened.   The dissection of the terminal ileum was done immediately proximal to the  Meckel's diverticulum. The mesentery was divided between [facility] clamps and  controlled with ligatures of 2-0 silk. The bowel was transected using a GIA  stapler. Dissection of the mesentery of the cecum and ascending colon was  carried out. The mesentery was divided between [facility] clamps. The ascending  colon was divided using GIA-60 stapler. The mesentery was then serially  clamped close to the bowel and divided. All vessels were tied with double  ligatures of 2-0 silk.  Once the inflamed tissue was removed, a side-to-side  stapled anastomosis was carried out. The antimesenteric border of the small  bowel was sutured to the antimesenteric border of the colon side-to-side  using a TIA-60 stapler. The residual opening was then stapled using a TA-60  stapler. The mesenteric defect was closed with interrupted silk.  Further  irrigation was carried out. The anastomosis was inspected and appeared to be  totally intact. It was not reinforced. Sponge, needle, instrument, and blade  counts were verified as correct. The abdomen was then closed using #1  Prolene on the fascia, 2-0 and 3-0 Monocryl in the subcutaneous tissue and  staples on the skin. A dressing was placed. She was awakened from anesthesia  uneventfully, transferred to a bed, and taken to the postanesthetic care  unit in satisfactory condition.      LCS/MEDQ  D:  07/24/2004  T:  07/24/2004  Job:  [id number]

## 2010-09-21 NOTE — Discharge Summary (Signed)
[name] [name], [name] [name]                ACCOUNT NO.:  [id number]   MEDICAL RECORD NO.:  [id number]          PATIENT TYPE:  INP   LOCATION:  A330                          FACILITY:  [facility]   PHYSICIAN:  Vernon Prey. Tamala Julian, M.D.   DATE OF BIRTH:  Dec 21, 1955   DATE OF ADMISSION:  07/21/2004  DATE OF DISCHARGE:  03/30/2006LH                                 DISCHARGE SUMMARY   DISCHARGE DIAGNOSES:  1.  Crohns disease with multiple intramural abscesses.  2.  Hypokalemia.  3.  Postoperative ileus.  4.  Mild anemia.   PROCEDURE:  Right colectomy with terminal ileal resection and primary side-  to-side ileocolonic anastomosis on July 24, 2004.   DISPOSITION:  The patient discharged home in stable satisfactory condition.   DISCHARGE MEDICATIONS:  1.  Xanax 0.5 mg b.i.d.  2.  Nexium 40 mg q.d.  3.  Reglan 10 mg a.c. and h.s.  4.  Darvocet N-100 1 to 2 q. 4 hours p.r.n.  5.  Prednisone in tapering doses.  6.  Ambien 6.25 mg q.h.s.  7.  Potassium chloride 20 meq b.i.d.   FOLLOW UP:  The patient is scheduled to be seen in the office in 2 weeks.   SUMMARY:  A 55 year old female admitted with probable Crohns disease with  multiple intramural abscesses. She gives a history of severe abdominal pain  made worse with meals, with fever and chills, with a 15 pound weight loss.  She was admitted through the emergency room where a CT of the abdomen  revealed marked thickening of the wall of the terminal ileum with multiple  intramural abscesses of the terminal ileum with a moderate amount of free  intraperitoneal fluid with inflammation of the mesentery but without free  air. The patient was not toxic and her white count was normal. She was  immediately started on intravenous antibiotics and intravenous steroids. She  has a serum potassium of 2.4, which had to be corrected. Once this was  stabilized, she was explored on July 24, 2004 and right colectomy with  terminal ileal resection and primary  anastomosis was carried out. There was  no major intraperitoneal contamination. The patient had slow return of bowel  function. Initial white count was elevated but this was felt to be due to  her steroid therapy. She was basically afebrile throughout her postoperative  course. She started passing gas by the 25th of March. Nasogastric tube was  discontinued on July 29, 2004. She tolerated a diet without difficulty and  had multiple bowel movements thereafter. From that point on, she made slow  but steady progress. Her wound healed uneventfully. She remained afebrile.  She had total return of bowel function and she was discharged home on the  9th postoperative day in satisfactory condition.      LCS/MEDQ  D:  09/23/2004  T:  09/23/2004  Job:  [id number]

## 2010-09-21 NOTE — H&P (Signed)
[name] [name], [name] [name]                ACCOUNT NO.:  [id number]   MEDICAL RECORD NO.:  [id number]          PATIENT TYPE:  INP   LOCATION:  A330                          FACILITY:  [facility]   PHYSICIAN:  Vernon Prey. Tamala Julian, M.D.   DATE OF BIRTH:  1956-05-03   DATE OF ADMISSION:  07/21/2004  DATE OF DISCHARGE:  LH                                HISTORY & PHYSICAL   A 55 year old female admitted with probable Crohn's disease with multiple  intramural abscesses.  The patient gives a history of severe abdominal pain  for the last 2-3 weeks.  The pain was made worse with meals.  She has fever  and chills.  She has had about a 15-pound weight loss over this interim.  Her pain has been in the right lower quadrant and the right flank.  It has  been crampy, sharp and radiating across her abdomen.  She has also had some  pain in the suprapubic area.  She has noted that she is becoming  increasingly fatigued.  She has not had nausea, vomiting or diarrhea.  The  patient was seen in the emergency room where on evaluation she was found to  have a tender abdomen, and CT of the abdomen revealed marked thickening of  the wall of the terminal ileum with multiple intramural abscesses of the  distal ileum.  These abscesses measure up to 3.9 x 3.4 cm.  There was a  small amount of free intraperitoneal fluid, and there was also inflammation  of the mesentery surrounding the involved segments.  There was no free air.  The patient is not toxic, and her white count is normal.  She has extensive  Crohn's disease, and she will be admitted and started on IV antibiotics and  IV steroids in order to stabilize the process, after which she will have  operative therapy.  She has a serum potassium of 2.4, and this will be  corrected preoperatively.   PAST MEDICAL HISTORY:  She has hypertension and has been on  hydrochlorothiazide for this.  She has no other medical illness.   MEDICATIONS:  1.  Nexium 400 mg b.i.d.  2.   Hydrochlorothiazide 25 mg q.d.  3.  Levsin 25 mg q.d.   ALLERGIES:  1.  PENICILLINS.  2.  CIPRO.   FAMILY HISTORY:  Positive for some type of intestinal problem which possibly  was intestinal carcinoma, atherosclerotic heart disease, Parkinson's  disease.   PAST SURGICAL HISTORY:  She had bilateral tubal pregnancies requiring  bilateral salpingectomies.  Her ovaries and uterus are intact.   SOCIAL HISTORY:  She is single.  She is employed as a Chemical engineer at  [employer].  She smokes 1/2 pack of cigarettes per day.  She  drinks two beers per day.  There is no history drug use.   PHYSICAL EXAMINATION:  GENERAL APPEARANCE:  On examination, she is a very  small female who looks about her stated age.  VITAL SIGNS:  Blood pressure is 85/57, pulse 85, respirations 18,  temperature 97.  Height 5 feet.  Weight 111 pounds.  HEENT:  Unremarkable.  NECK:  Supple.  No thyromegaly.  No adenopathy.  No bruit or jugular venous  distention.  CHEST:  Clear to auscultation.  No rales, rubs, rhonchi or wheezes.  HEART:  Regular rate and rhythm without murmur, gallop or rub.  ABDOMEN:  Nondistended but with guarding in the right lower quadrant and the  suprapubic area with tenderness with rebound in the right lower quadrant.  She has hyperactive bowel sounds.  There is no induration of the abdominal  wall.  EXTREMITIES:  No cyanosis, clubbing or edema.  No joint deformity.  NEUROLOGIC:  No focal motor, sensory or cerebellar deficit.  Cranial nerves  are intact.  Deep tendon reflexes are intact.   IMPRESSION:  1.  Crohn's ileitis with intramural abscesses.  2.  Hypertension by history.  3.  Hypokalemia.   PLAN:  The patient is admitted.  She will be started on IV fluids.  She will  receive IV potassium and will be given antibiotics which will consist of  Flagyl, doxycycline and gentamycin.  She will also receive Solu Medrol 250  mg IV q.6h.  She will remain n.p.o. except for sips  of liquid.  After about  48 hours, we will proceed with surgical resection, hopefully with primary  reanastomosis.  This was discussed with the patient.  The patient and her  parents are also informed that this will not be a curative operation because  Crohn's disease is an indolent, relapsing, remitting-type illness and there  is no way to predict what her future course will be.      LCS/MEDQ  D:  07/22/2004  T:  07/22/2004  Job:  [id number]   cc:   [facility]

## 2010-09-21 NOTE — Op Note (Signed)
NAMEJAIMI, [name] [name]                ACCOUNT NO.:  [id number]   MEDICAL RECORD NO.:  [id number]          PATIENT TYPE:  AMB   LOCATION:  DAY                           FACILITY:  [facility]   PHYSICIAN:  Leane Para C. Tamala Julian, M.D.   DATE OF BIRTH:  Jul 07, 1955   DATE OF PROCEDURE:  05/21/2005  DATE OF DISCHARGE:                                 OPERATIVE REPORT   PREOPERATIVE DIAGNOSIS:  Incisional hernia.   POSTOPERATIVE DIAGNOSIS:  Incision hernia.   PROCEDURE:  Incisional hernia repair.   INDICATIONS:  A 55 year old female with history of severe Crohn's disease  with multiple interloop abscesses involving the cecum and terminal ileum.  The patient underwent primary resection in March 2006. She started  developing bulging of her lower midline incision around September, and it  progressed and became symptomatic in December. She has a large fascial  defect with viscera protruding through the defect. She is also having  increasing discomfort.   PROCEDURE:  Under general endotracheal anesthesia, the patient's abdomen was  prepped and draped in a sterile field. The old midline incision was opened.  In the subcutaneous tissue, the fascial defect was found. Incision was  extended through the sac, and it was apparent that most of the fascia had  separated. The major portion was in the inferior one third of the incision.  Fascial margins were developed. It was felt that she had adequate fascial  margins to do a primary repair since there was no tension and since there  had not been any retraction of the muscle and fascia. Once the fascial  margins were developed, the fascia was reapproximated using running #1  Prolene. It was a good closure without any tension at all. Good wide margins  were taken. Subcutaneous tissue was closed with 2-0 Vicryl. Skin was closed  with staples. The patient tolerated the procedure well. She was awakened  from anesthesia, transferred to a bed and taken to the post-anesthesia  care  unit for further monitoring.      Vernon Prey. Tamala Julian, M.D.  Electronically Signed     LCS/MEDQ  D:  05/21/2005  T:  05/21/2005  Job:  [id number]

## 2010-09-25 ENCOUNTER — Encounter: Payer: Self-pay | Admitting: Family Medicine

## 2010-09-25 ENCOUNTER — Ambulatory Visit (INDEPENDENT_AMBULATORY_CARE_PROVIDER_SITE_OTHER): Payer: Self-pay | Admitting: Family Medicine

## 2010-09-25 VITALS — BP 110/80 | HR 92 | Resp 16 | Ht 60.0 in | Wt 124.4 lb

## 2010-09-25 DIAGNOSIS — F411 Generalized anxiety disorder: Secondary | ICD-10-CM

## 2010-09-25 DIAGNOSIS — F329 Major depressive disorder, single episode, unspecified: Secondary | ICD-10-CM

## 2010-09-25 DIAGNOSIS — IMO0002 Reserved for concepts with insufficient information to code with codable children: Secondary | ICD-10-CM

## 2010-09-25 DIAGNOSIS — K5090 Crohn's disease, unspecified, without complications: Secondary | ICD-10-CM

## 2010-09-25 DIAGNOSIS — E785 Hyperlipidemia, unspecified: Secondary | ICD-10-CM

## 2010-09-25 LAB — LIPID PANEL
Cholesterol: 290 mg/dL — ABNORMAL HIGH (ref 0–200)
HDL: 150 mg/dL (ref >39)
LDL Cholesterol: 122 mg/dL — ABNORMAL HIGH (ref 0–99)
Triglycerides: 90 mg/dL (ref <150)

## 2010-09-25 LAB — HEPATIC FUNCTION PANEL
AST: 52 U/L — ABNORMAL HIGH (ref 0–37)
Alkaline Phosphatase: 90 U/L (ref 39–117)
Indirect Bilirubin: 0.3 mg/dL (ref 0.0–0.9)
Total Protein: 6.8 g/dL (ref 6.0–8.3)

## 2010-09-25 MED ORDER — QUINAPRIL-HYDROCHLOROTHIAZIDE 10-12.5 MG PO TABS: 1.0000 | ORAL_TABLET | Freq: Every day | ORAL | Status: DC

## 2010-09-25 NOTE — Patient Instructions (Signed)
You need to return in 4 months.  Fasting lipids today.Marland Kitchen

## 2010-09-25 NOTE — Progress Notes (Signed)
  Subjective:    Patient ID: [name] [name], female    DOB: [birth year], 55 y.o.   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 C/O pain from the surgery. She has chronic vicodin three times daily from her gI doc.  She has an upcoming hearing for disability the hearing has been denied once.Trying to get disability due to dx of Chrons in 2006, had partial removal her intestine. She has  also had back surgery and is seeking disability for this also, states mobility and activity is limited as a result.  Requests records/recommendation be made from this office as far as disability eligibility is concerned, advsied the practice is to provide records, she has legal advice , but states she is unaware of doctors who specifically do examinations for disability, advised her t sk with social services and/or her lawyer about this.    Review of Systems See HPI. Denies recent fever or chills. Denies sinus pressure, nasal congestion, ear pain or sore throat. Denies chest congestion, productive cough or wheezing. Denies chest pains, palpitations, paroxysmal nocturnal dyspnea, orthopnea and leg swelling Chronic abdominal pain,denies  Nausea or  Vomiting.  Denies current  rectal bleeding or change in bowel movement. Denies dysuria, frequency, hesitancy or incontinence. Chronic back pain and  and limitation in mobility. Denies headaches, seizure, numbness, or tingling. Denies uncontrolled  depression, anxiety or insomnia. Denies skin break down or rash.     Objective:   Physical Exam Patient alert and oriented and in no Cardiopulmonary distress.  HEENT: No facial asymmetry, EOMI, no sinus tenderness, TM's clear, Oropharynx pink and moist.  Neck supple no adenopathy.  Chest: Clear to auscultation bilaterally.  CVS: S1, S2 no murmurs, no S3.  ABD: Soft non tender. Bowel sounds normal.  Ext: No edema  MS: decrased   ROM spine,adequate in  shoulders, hips and knees.  Skin: Intact, no ulcerations or rash noted.  Psych: Good eye contact, normal affect. Memory intact  Mildly nxious not  depressed appearing.  CNS: CN 2-12 intact, power, tone and sensation normal throughout.        Assessment & Plan:

## 2010-09-30 NOTE — Assessment & Plan Note (Signed)
Somewhat increased due to anxiety over disabilyt appeal, not suicidal orhomicidal, no hallucinations

## 2010-09-30 NOTE — Assessment & Plan Note (Signed)
Unchanged, pt reports this as a disabling condition

## 2010-09-30 NOTE — Assessment & Plan Note (Signed)
Followed by GI and is on chronic pain meds

## 2010-09-30 NOTE — Assessment & Plan Note (Signed)
Controlled on chronic med , no change

## 2010-10-02 ENCOUNTER — Telehealth: Payer: Self-pay | Admitting: *Deleted

## 2010-10-05 NOTE — Telephone Encounter (Signed)
Opened in error

## 2010-10-09 ENCOUNTER — Encounter: Payer: Self-pay | Admitting: Gastroenterology

## 2010-10-09 ENCOUNTER — Other Ambulatory Visit: Payer: Self-pay

## 2010-10-09 ENCOUNTER — Ambulatory Visit (INDEPENDENT_AMBULATORY_CARE_PROVIDER_SITE_OTHER): Payer: Self-pay | Admitting: Gastroenterology

## 2010-10-09 VITALS — BP 107/76 | HR 90 | Temp 97.9°F | Ht 60.0 in | Wt 119.2 lb

## 2010-10-09 DIAGNOSIS — K219 Gastro-esophageal reflux disease without esophagitis: Secondary | ICD-10-CM

## 2010-10-09 DIAGNOSIS — R197 Diarrhea, unspecified: Secondary | ICD-10-CM

## 2010-10-09 MED ORDER — DIAZEPAM 5 MG PO TABS: ORAL_TABLET | ORAL | Status: DC

## 2010-10-09 NOTE — Progress Notes (Signed)
Cc to PCP 

## 2010-10-09 NOTE — Progress Notes (Signed)
Referring Provider: Tula Nakayama, MD Primary Care Physician:  Tula Nakayama, MD, MD Primary Gastroenterologist: Dr. Oneida Alar  Chief Complaint  Patient presents with  . Abdominal Pain  . Back Pain    HPI:   Hx of IBS, recent vocal cord surgery May 17th. Path pending, question of pre-cancerous vs other etiology. Hx of Crohn's, with surgical remission.  Feels like IBS is flaring. Feels worse since seeing [place] in early May. Since surgery May 17th, has lost appetite. Ate a little fish the other day. Had diarrhea/vomiting with pork. Has lost 5 lbs.  Reports multiple loose stools per day. Pain in back, radiates "all over". Has pain in head. Would like to try Bentyl, but can't afford. Took Questran in past and did well with this. No blood in stool. Water aggravates stools. Trying to eat. Feels like Nexium is making it worse. No sick contacts. + Abx prior to surgery. Well water. No change in medicines. Nausea, doesn't feel like eating. Loose, watery diarrhea.  Hx of mildly elevated transaminases with full work-up in past. Most recent LFTs with AST 52, ALT 38. AP nl.  Past Medical History  Diagnosis Date  . Elevated liver enzymes 2009 ?Etoh    NEG ANA & ASMA  . Anastomotic ulcer JAN 2009  . Esophageal stricture 2009  . Diarrhea MULITIFACTORIAL    IBS, LACTOSE INTOLERANCE, SBBO, BILE-SALT  . Inflammatory bowel disease 2006 CD    SURGICAL REMISSION  . LBP (low back pain)   . Migraine   . Asthma   . Allergic rhinitis   . CTS (carpal tunnel syndrome)   . Hyperlipemia   . Anxiety     Past Surgical History  Procedure Date  . Colonoscopy 2009  . Hemicolectomy RIGHT 2006  . Hernia repair   . Carpal tunnel release LEFT  . Lumbar disc surgery   . Vocal cord surgery Sep 20, 2010    precancerous areas removed    Current Outpatient Prescriptions  Medication Sig Dispense Refill  . albuterol (PROAIR HFA) 108 (90 BASE) MCG/ACT inhaler Inhale 2 puffs into the lungs every 6 (six) hours as  needed.        Marland Kitchen amLODipine (NORVASC) 5 MG tablet Take 5 mg by mouth daily.        . budesonide-formoterol (SYMBICORT) 160-4.5 MCG/ACT inhaler Inhale 2 puffs into the lungs 2 (two) times daily.        . calcium carbonate (TUMS - DOSED IN MG ELEMENTAL CALCIUM) 500 MG chewable tablet Chew 1 tablet by mouth daily.        . diazepam (VALIUM) 5 MG tablet Take 5 mg by mouth every 6 (six) hours as needed.        Marland Kitchen esomeprazole (NEXIUM) 20 MG capsule Take 1 capsule (20 mg total) by mouth 2 (two) times daily.  180 capsule  3  . HYDROcodone-acetaminophen (VICODIN) 5-500 MG per tablet Take 1 tablet by mouth every 6 (six) hours as needed.        . quinapril-hydrochlorothiazide (ACCURETIC) 10-12.5 MG per tablet Take 1 tablet by mouth daily.  30 tablet  3    Allergies as of 10/09/2010 - Review Complete 10/09/2010  Allergen Reaction Noted  . Lovastatin  09/25/2010    History   Social History  . Marital Status: Divorced    Spouse Name: N/A    Number of Children: N/A  . Years of Education: N/A   Social History Main Topics  . Smoking status: Former Research scientist (life sciences)  . Smokeless  tobacco: None  . Alcohol Use: No  . Drug Use: No     Review of Systems: Gen: Denies fever, chills, anorexia. Denies fatigue, weakness, weight loss.  CV: Denies chest pain, palpitations, syncope, peripheral edema, and claudication. Resp: Denies dyspnea at rest, cough, wheezing, coughing up blood, and pleurisy. GI: Denies vomiting blood, jaundice, and fecal incontinence.   Denies dysphagia or odynophagia. Derm: Denies rash, itching, dry skin Psych: Denies depression, anxiety, memory loss, confusion. No homicidal or suicidal ideation.  Heme: Denies bruising, bleeding, and enlarged lymph nodes.  Physical Exam: BP 107/76  Pulse 90  Temp(Src) 97.9 F (36.6 C) (Temporal)  Ht 5' (1.524 m)  Wt 119 lb 3.2 oz (54.069 kg)  BMI 23.28 kg/m2 General:   Alert and oriented. No distress noted. Pleasant and cooperative.  Head:   Normocephalic and atraumatic. Eyes:  Conjuctiva clear without scleral icterus. Mouth:  Oral mucosa pink and moist. Good dentition. No lesions. Neck:  Supple, without mass or thyromegaly. Heart:  S1, S2 present without murmurs, rubs, or gallops. Regular rate and rhythm. Abdomen:  +BS, soft, non-tender and non-distended. No rebound or guarding. No HSM or masses noted. Msk:  Symmetrical without gross deformities. Normal posture. Pulses:  2+ DP noted bilaterally Extremities:  Without edema. Neurologic:  Alert and  oriented x4;  grossly normal neurologically. Skin:  Intact without significant lesions or rashes. Cervical Nodes:  No significant cervical adenopathy. Psych:  Alert and cooperative. Normal mood and affect.

## 2010-10-09 NOTE — Patient Instructions (Signed)
Stop Nexium. Begin Dexilant 60 mg daily. You have been given samples of this. Contact our office in 10-14 days to inform if any improvement.  Please obtain stool studies and return to our office. Once these are reviewed, we will be able to potentially start a new medication that has done well for you in the past.  We will see you back in 4 weeks with Dr. Oneida Alar.

## 2010-10-09 NOTE — Assessment & Plan Note (Signed)
55 year old female with hx of Crohn's, surgical remission. IBS, predominantly diarrhea. Last seen early May, but now with increased stools, TNTC per pt report. No melena or brbpr. Unable to afford Bentyl. Would like to try Questran. In process of obtaining disability. Recent exposure to abx during vocal cord procedure. Doubt infectious process, but will obtain stool studies due to increased frequency. Will add Questran will disability assistance is obtained.   Cdiff PCR, Giardia, fecal lactoferrin, stool culture Questran vs Bentyl in future F/U in 4 weeks with Dr. Oneida Alar

## 2010-10-09 NOTE — Assessment & Plan Note (Signed)
Extensive work-up in past. Mildly elevated AST/ALT at 52 and 38, respectively. Alk Phos normal. Drawn May 2012. Monitor, f/u with Dr. Oneida Alar in 4 weeks. Further recommendations at that time. Likely repeat in 3-6 months.

## 2010-10-09 NOTE — Assessment & Plan Note (Signed)
On Nexium, feels this makes her IBS worse. Will trial Dexilant. Samples provided. Pt to call with PR if improvement. No rx given today. Will f/u in 4 weeks.

## 2010-10-15 ENCOUNTER — Other Ambulatory Visit: Payer: Self-pay | Admitting: *Deleted

## 2010-10-15 MED ORDER — BUDESONIDE-FORMOTEROL FUMARATE 160-4.5 MCG/ACT IN AERO: 2.0000 | INHALATION_SPRAY | Freq: Two times a day (BID) | RESPIRATORY_TRACT | Status: DC

## 2010-10-17 ENCOUNTER — Telehealth: Payer: Self-pay

## 2010-10-17 NOTE — Telephone Encounter (Signed)
Reviewed. Have explained our Vicodin agreement to Ms. Palla on many occasions.

## 2010-10-17 NOTE — Telephone Encounter (Signed)
Pt called and left VM she needs early refill on her Hydrocodone. Said she did not take the percocet the doctor gave her when she had her vocal cord surgery on  17th, she took extra hydrocodone. Please advise!

## 2010-10-17 NOTE — Telephone Encounter (Signed)
Please call pt. She cannot have an early refill on her Vicodin. Our agreement is #90 pills per month.

## 2010-10-17 NOTE — Telephone Encounter (Signed)
Informed pt . She is very upset and said, "what have I got to do, suffer til Friday?" I told her that if she made the agreement with Dr. Oneida Alar she needs to stick to it. She said that there are sometimes other circumstances that change the situation. She said she had spoken with the doctor who did her throat surgery yesterday, and explained to him that she did not take the percocet, but took extra hydrocodone. He told her to take it up with her GI doctor, especially since she no longer has pain from the surgery, but has the abdominal pain. She was very irritated and kept me on the phone, and finally she said well " I will be there Friday morning for my prescription since i do not have any more. ) I just told her that I would give the message to Dr. Oneida Alar.

## 2010-10-18 ENCOUNTER — Other Ambulatory Visit: Payer: Self-pay | Admitting: Gastroenterology

## 2010-10-18 NOTE — Telephone Encounter (Signed)
Pt called RE: Vicodin Rx, #1 mo supply, rfxo, 1 po tid. She needs refills beginning 10/23/2010. Rx given for June-DEC. Next refill Jan 2012.

## 2010-10-19 NOTE — Telephone Encounter (Signed)
Pt's prescriptions written for monthly thru Dec.  I made copies to be scanned. They are dated for 19th of each month. I told Dr. Oneida Alar that pt said it was due on the 15th. Dr. Oneida Alar said that according to her records the scripts are due on the 19th of the month and their agreement was #90 monthly. Per Dr. Oneida Alar can tell pt to ask pharmacist to page her today if there is a question. Pt came by this AM adn picked up RX's and Darius Bump informed of what Dr. Oneida Alar had said.

## 2010-10-24 NOTE — Progress Notes (Signed)
WORKUP INCLUDED SEROLOGIES AND MRCP-unremarkable. DISCUSSED LIVER Bx WITH PT but liver enzymes nl July 2011.

## 2010-11-12 ENCOUNTER — Ambulatory Visit (INDEPENDENT_AMBULATORY_CARE_PROVIDER_SITE_OTHER): Payer: Self-pay | Admitting: Gastroenterology

## 2010-11-12 VITALS — BP 129/86 | HR 86 | Temp 97.1°F

## 2010-11-12 DIAGNOSIS — K589 Irritable bowel syndrome without diarrhea: Secondary | ICD-10-CM

## 2010-11-12 MED ORDER — HYDROCODONE-ACETAMINOPHEN 5-500 MG PO TABS: 1.0000 | ORAL_TABLET | Freq: Three times a day (TID) | ORAL | Status: DC

## 2010-11-12 NOTE — Progress Notes (Signed)
Subjective:    Patient ID: [name] [name], female    DOB: 04-26-1956, 55 y.o.   MRN: [id number]  PCP: [facility]  HPI TAKING NEXIUM bid AND MADE her pain and diarrhea worse. Now taking Nexium qod and pain and diarrhea are better. Dexilant samples taken, but now on Nexium. [place] via the county. NOW HAS DISABILITY-took 2.5 years. Bms: 2-6 times a day. Ate a low sodium/extra lean ham sandwich (bread, not crackers) and didn't agree with her yesterday. Freezer went out and now out deer meat. So eating hamburger. Also pond fish and chicken. Using Kaopectate and it helps. Uses it if gets really watery stool. Usu. Can tell if it's going to be bad. Eyes blood shot since surgery.  Requesting one pain med script w/ 5 refills. Returned other hand-written Rxs.   Past Medical History  Diagnosis Date  . Elevated liver enzymes 2009 ?Etoh    NEG ANA & ASMA  . Anastomotic ulcer JAN 2009  . Esophageal stricture 2009  . Diarrhea MULITIFACTORIAL    IBS, LACTOSE INTOLERANCE, SBBO, BILE-SALT  . Inflammatory bowel disease 2006 CD    SURGICAL REMISSION  . LBP (low back pain)   . Migraine   . Asthma   . Allergic rhinitis   . CTS (carpal tunnel syndrome)   . Hyperlipemia   . Anxiety    Past Surgical History  Procedure Date  . Colonoscopy 2009  . Hemicolectomy RIGHT 2006  . Hernia repair   . Carpal tunnel release LEFT  . Lumbar disc surgery   . Vocal cord surgery Sep 20, 2010    precancerous areas removed   Allergies  Allergen Reactions  . Lovastatin     Chest pain   Current Outpatient Prescriptions  Medication Sig Dispense Refill  . albuterol (PROAIR HFA) 108 (90 BASE) MCG/ACT inhaler Inhale 2 puffs into the lungs every 6 (six) hours as needed.        Marland Kitchen amLODipine (NORVASC) 5 MG tablet Take 5 mg by mouth daily.        . budesonide-formoterol (SYMBICORT) 160-4.5 MCG/ACT inhaler Inhale 2 puffs into the lungs 2 (two) times daily.  3 Inhaler  3  . calcium carbonate (TUMS - DOSED IN MG ELEMENTAL  CALCIUM) 500 MG chewable tablet Chew 1 tablet by mouth daily.        . diazepam (VALIUM) 5 MG tablet 1/2 tab twice daily  30 tablet  2  . esomeprazole (NEXIUM) 20 MG capsule Take 1 capsule (20 mg total) by mouth 2 (two) times daily.  180 capsule  3  . HYDROcodone-acetaminophen (VICODIN) 5-500 MG per tablet Take 1 tablet by mouth every 6 (six) hours as needed.        . quinapril-hydrochlorothiazide (ACCURETIC) 10-12.5 MG per tablet Take 1 tablet by mouth daily.  30 tablet  3   Review of Systems  All other systems reviewed and are negative.       Objective:   Physical Exam  Constitutional: She is oriented to person, place, and time. She appears well-nourished. No distress.  HENT:  Head: Normocephalic and atraumatic.  Cardiovascular: Normal rate, regular rhythm and normal heart sounds.   Pulmonary/Chest: Effort normal and breath sounds normal. She has no wheezes.  Abdominal: Soft. Bowel sounds are normal. She exhibits no distension. There is tenderness (MILD TTP BUQs). There is no rebound and no guarding.  Neurological: She is alert and oriented to person, place, and time.          Assessment &  Plan:

## 2010-11-12 NOTE — Progress Notes (Signed)
Cc to PCP 

## 2010-11-12 NOTE — Assessment & Plan Note (Signed)
Sx stable.  Continue Kaopectate prn. OPV in 6 mos.

## 2010-11-20 ENCOUNTER — Telehealth: Payer: Self-pay

## 2010-11-20 NOTE — Telephone Encounter (Signed)
Tracey from [facility] called and said that the formula for the Vicodin is changing to 5/300 but they do not have it yet. She would like to know if she can switch to [place]. Rx is to be filled tomorrow on 11/21/2010. Please advise!

## 2010-11-20 NOTE — Telephone Encounter (Signed)
Please call [facility]Vicodin 5/325 ok.

## 2010-11-21 NOTE — Progress Notes (Signed)
65mfollow appt in epic

## 2010-11-21 NOTE — Telephone Encounter (Signed)
Informed pharmacist Aaron Edelman @ [place].

## 2010-12-14 ENCOUNTER — Telehealth: Payer: Self-pay | Admitting: Family Medicine

## 2010-12-14 MED ORDER — DIAZEPAM 5 MG PO TABS: ORAL_TABLET | ORAL | Status: DC

## 2010-12-14 NOTE — Telephone Encounter (Signed)
Med sent as requested

## 2011-01-28 ENCOUNTER — Ambulatory Visit (INDEPENDENT_AMBULATORY_CARE_PROVIDER_SITE_OTHER): Payer: Medicaid Other | Admitting: Family Medicine

## 2011-01-28 ENCOUNTER — Encounter: Payer: Self-pay | Admitting: Family Medicine

## 2011-01-28 VITALS — BP 140/98 | HR 82 | Resp 16 | Ht 60.0 in | Wt 126.1 lb

## 2011-01-28 DIAGNOSIS — E785 Hyperlipidemia, unspecified: Secondary | ICD-10-CM

## 2011-01-28 DIAGNOSIS — I10 Essential (primary) hypertension: Secondary | ICD-10-CM

## 2011-01-28 DIAGNOSIS — R42 Dizziness and giddiness: Secondary | ICD-10-CM

## 2011-01-28 DIAGNOSIS — J449 Chronic obstructive pulmonary disease, unspecified: Secondary | ICD-10-CM

## 2011-01-28 DIAGNOSIS — F411 Generalized anxiety disorder: Secondary | ICD-10-CM

## 2011-01-28 NOTE — Patient Instructions (Addendum)
I will check your routine labs Continue your current medications Call if your dizziness does not improve We will call with lab results Let me know when your paper work goes through so we can get your Mammogram done Make a nurse visit for your PAP Smear Restart your lovastatin take 1/2 a pill every other day  Next visit in Feb 2013

## 2011-01-28 NOTE — Progress Notes (Signed)
  Subjective:    Patient ID: [name] [name], female    DOB: 1956/01/16, 55 y.o.   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 Barney Drain, currently stable, has pain contract for vicodin     HTN- does not take her BP at homes, takes BP pills regulary, has been feeling dizzy for a few weeks,       Dizzy spells- has cough which is mildly productive, has gagging after dizzy spells and cough, after she rolls over in bed typically has a head rush, occ has vision changes but not sure if they coicide with dizzy spells as she had them before, started to discuss her vision changes as "red eye"  Also if she gets up quickly sometimes during the day will have a spell    - states her head has not felt right since her last surgery, she thinks the vocal cord surgery may be contributing to her dizzy spells   Anxiety-- pt recently established with [facility], does not see psych-- Maintained on Valium  COPD- takes Symbicort, uses Proair(albuterol) daily as well   Hyperlipidemia- stopped Lovastatin, because she had CP and dizziness a few months ago, was told to take every other day, she has not started this but willing to do this    Review of Systems GEN- denies fatigue, fever, weight loss,weakness, recent illness HEENT- denies eye drainage,+ change in vision, nasal discharge, CVS- denies chest pain, palpitations RESP- denies SOB, cough, wheeze ABD- denies N/V, change in stools,  Neuro- denies headache, +dizziness, denies syncope, seizure activity       Objective:   Physical Exam GEN- NAD, alert and oriented x3 HEENT- PERRL, EOMI, non injected sclera, pink conjunctiva, MMM, oropharynx clear, fundoscopic exam benign Neck- Supple, no thryomegaly, no bruit CVS- RRR, no murmur RESP-CTAB EXT- No edema Pulses- Radial, DP- 2+ Neuro- CN II-XII in tact, motor  in tact, sensation in tact, neg rhomberg, coordination in tact, gait normal, she did experience dizziness with ROM of neck Psych- not overly anxious or depressed, normal affect      Assessment & Plan:

## 2011-01-29 ENCOUNTER — Encounter: Payer: Self-pay | Admitting: Family Medicine

## 2011-01-29 NOTE — Assessment & Plan Note (Signed)
Pt episodes are very short lived, but worrisome to her, normal neuro exam, likley BPV, doubt the surgery has anything to do with this. She declined Meclizine, and neurology referral at this time.

## 2011-01-29 NOTE — Assessment & Plan Note (Signed)
Stable at this time. Discussed use of albuterol

## 2011-01-29 NOTE — Assessment & Plan Note (Signed)
Restart at every other day, dizziness has persisted despite stopping medication

## 2011-01-29 NOTE — Assessment & Plan Note (Signed)
Continue current meds, check labs Pt not orthostatic

## 2011-01-29 NOTE — Assessment & Plan Note (Signed)
Chronic med, no change

## 2011-02-11 ENCOUNTER — Other Ambulatory Visit: Payer: Self-pay

## 2011-02-11 NOTE — Telephone Encounter (Signed)
I'm having trouble figuring out when this was prescribed. Can you help?

## 2011-02-11 NOTE — Telephone Encounter (Signed)
Vicente Males, please see your office note of 10/09/2010.

## 2011-02-13 NOTE — Telephone Encounter (Signed)
[name] [name], I called the pharmacist at [facility]. He said it was last written in 2010 by Vickey Huger, NP. Pt has not had it in over a year.

## 2011-02-19 ENCOUNTER — Telehealth: Payer: Self-pay

## 2011-02-19 NOTE — Telephone Encounter (Signed)
Pt called and would like to get the Rx for Questran. It helps her diarrhea. She has follow-up appt 03/13/2011 with Dr. Oneida Alar. She had last had Rx in 2010. She said it was so expensive that she could not afford it then. She has her disability ins now and would like to get Rx called to [facility]. Please advise!

## 2011-02-19 NOTE — Telephone Encounter (Signed)
We can call in Questran 4g/packet, take 1/2 packet once daily, increase to BID if needed. Disp # 30 packets with 3 refills.

## 2011-02-19 NOTE — Telephone Encounter (Signed)
Rx called to [facility] @ [employer]. Pt aware.

## 2011-03-12 LAB — CBC
HCT: 43.6 % (ref 36.0–46.0)
Hemoglobin: 15.6 g/dL — ABNORMAL HIGH (ref 12.0–15.0)
MCHC: 35.8 g/dL (ref 30.0–36.0)
MCV: 97.8 fL (ref 78.0–100.0)
WBC: 5.4 10*3/uL (ref 4.0–10.5)

## 2011-03-12 LAB — COMPREHENSIVE METABOLIC PANEL
AST: 148 U/L — ABNORMAL HIGH (ref 0–37)
BUN: 8 mg/dL (ref 6–23)
Calcium: 9.3 mg/dL (ref 8.4–10.5)
Chloride: 96 mEq/L (ref 96–112)
Creat: 0.52 mg/dL (ref 0.50–1.10)
Total Bilirubin: 0.6 mg/dL (ref 0.3–1.2)

## 2011-03-12 LAB — LDL CHOLESTEROL, DIRECT: Direct LDL: 29 mg/dL

## 2011-03-13 ENCOUNTER — Ambulatory Visit: Payer: Self-pay | Admitting: Gastroenterology

## 2011-03-13 ENCOUNTER — Encounter: Payer: Self-pay | Admitting: Gastroenterology

## 2011-03-13 ENCOUNTER — Other Ambulatory Visit: Payer: Self-pay | Admitting: Family Medicine

## 2011-03-14 ENCOUNTER — Encounter: Payer: Self-pay | Admitting: Gastroenterology

## 2011-03-14 ENCOUNTER — Ambulatory Visit (INDEPENDENT_AMBULATORY_CARE_PROVIDER_SITE_OTHER): Payer: Medicare Other | Admitting: Gastroenterology

## 2011-03-14 VITALS — BP 137/90 | HR 90 | Temp 97.6°F | Ht 60.0 in | Wt 125.8 lb

## 2011-03-14 DIAGNOSIS — R1310 Dysphagia, unspecified: Secondary | ICD-10-CM | POA: Insufficient documentation

## 2011-03-14 DIAGNOSIS — K759 Inflammatory liver disease, unspecified: Secondary | ICD-10-CM

## 2011-03-14 NOTE — Progress Notes (Signed)
Subjective:    Patient ID: [name] [name], female    DOB: 20-Nov-1955, 55 y.o.   MRN: [id number]  PCP: [place]  HPI Pt has had elevated transaminases since 2009-AST/ALT. LAST HFP-AST 148 ALT 107 T BILI 0.6 HB 15.6. NO NEW MEDS, SUPPLEMENTS, OR HERBAL PILLS. PT DENIES ETOH INTAKE. Taking Questran for diarrhea-helping. Weight stable. Appetite: fair. No itching. HAS FATIGUE AND BIL KNEE PAIN. RIGHT HAND FALLS ASLEEP.  RARE NAUSEA OR VOMITING. GAGS IN THE AM. BMs: ONCE A DAY. RARE RECTAL BLEEDING-<1X/MO, HEMORRHOIDS ARE GONE. TAKES SMALL BITES BECAUSE FEELS LIKE FOOD GETS STUCK. LAST DIL 2010.  Past Medical History  Diagnosis Date  . Elevated liver enzymes 2009 ?Etoh, ALT 42    NEG ANA & ASMA  . Anastomotic ulcer JAN 2009  . Esophageal stricture 2009  . Diarrhea MULITIFACTORIAL    IBS, LACTOSE INTOLERANCE, SBBO, BILE-SALT  . Inflammatory bowel disease 2006 CD    SURGICAL REMISSION  . LBP (low back pain)   . Migraine   . Asthma   . Allergic rhinitis   . CTS (carpal tunnel syndrome)   . Hyperlipemia   . Anxiety   . Hemorrhoid   . Crohn disease   . BMI (body mass index) 20.0-29.9 2009 121 lbs   Past Surgical History  Procedure Date  . Hemicolectomy RIGHT 2006  . Hernia repair   . Carpal tunnel release LEFT  . Lumbar disc surgery   . Vocal cord surgery Sep 20, 2010    precancerous areas removed  . Esophagogastroduodenoscopy 02/07/09    mild gastritis  . Colonoscopy JAN 2009 DIARRHEA, ABD PAIN, BRBPR    ANASTOMOTIC ULCER(3MM), IH GM:WNUUVO ULCER, NL COLON bX  . Upper gastrointestinal endoscopy JAN 2009 ABD PAIN    GASTRITIS, ESO RING  . Upper gastrointestinal endoscopy OCT 2010 ABD PAIN, DYSPHAGIA    DIL 15MM, GASTRITIS, NL DUODENUM  . Upper gastrointestinal endoscopy OCT 2010 DYSPHAGIA    DIL 17 MM, GASTRITIS, NL DUODENUM   Allergies  Allergen Reactions  . Lovastatin     Chest pain   Current Outpatient Prescriptions  Medication Sig Dispense Refill  . albuterol (PROAIR  HFA) 108 (90 BASE) MCG/ACT inhaler Inhale 2 puffs into the lungs every 6 (six) hours as needed.      Marland Kitchen amLODipine (NORVASC) 5 MG tablet Take 5 mg by mouth daily.      . budesonide-formoterol (SYMBICORT) 160-4.5 MCG/ACT inhaler Inhale 2 puffs into the lungs 2 (two) times daily.    . calcium carbonate (TUMS - DOSED IN MG ELEMENTAL CALCIUM) 500 MG chewable tablet Chew 1 tablet by mouth daily.      . diazepam (VALIUM) 5 MG tablet TAKE ONE HALF TABLET BY MOUTH TWICE DAILY    . esomeprazole (NEXIUM) 20 MG capsule Take 20 mg by mouth 2 (two) times daily. Reports one every other day     . HYDROcodone-acetaminophen (VICODIN) 5-500 MG per tablet Take 1 tablet by mouth 3 (three) times daily.    . quinapril-hydrochlorothiazide (ACCURETIC) 10-12.5 MG per tablet Take 1 tablet by mouth daily.         Review of Systems     Objective:   Physical Exam  Vitals reviewed. Constitutional: She is oriented to person, place, and time. She appears well-developed. No distress.  HENT:  Head: Normocephalic and atraumatic.  Mouth/Throat: No oropharyngeal exudate.  Eyes: Pupils are equal, round, and reactive to light. No scleral icterus.  Neck: Normal range of motion. Neck supple.  Cardiovascular: Normal  rate, regular rhythm and normal heart sounds.   Pulmonary/Chest: Effort normal and breath sounds normal. No respiratory distress.  Abdominal: Soft. Bowel sounds are normal. She exhibits no distension. There is tenderness (mild ttp in rlq).  Lymphadenopathy:    She has no cervical adenopathy.  Neurological: She is alert and oriented to person, place, and time.       NO FOCAL DEFICITS   Psychiatric: She has a normal mood and affect.          Assessment & Plan:

## 2011-03-14 NOTE — Assessment & Plan Note (Signed)
CHART REVIEWED 2007.  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 OPV IN 3 MOS.

## 2011-03-14 NOTE — Assessment & Plan Note (Signed)
SOLID DYSPHAGIA-HX: ESO STRICTURE.  EGD/DIL 12/3 W/ MAC DUE TO PT FAILED MODERATE SEDATION ON PREVIOUS ENDOSCOPIES. OPV IN 3 MOS.

## 2011-03-14 NOTE — Progress Notes (Signed)
Reminder in epic to follow up in 3 months with SF in E30 visit

## 2011-03-14 NOTE — Progress Notes (Signed)
Cc to PCP 

## 2011-03-14 NOTE — Patient Instructions (Signed)
Get labs drawn within the next month. YOU NEED A LIVER BIOPSY. WE WILL SCHEDULE AFTER YOUR UPPER ENDOSCOPY. FOLLOW UP IN 3 MOS.

## 2011-03-26 LAB — IGG, IGA, IGM
IgA: 193 mg/dL (ref 69–380)
IgM, Serum: 136 mg/dL (ref 52–322)

## 2011-03-26 LAB — HEPATITIS PANEL, ACUTE
HCV Ab: NEGATIVE
Hep A IgM: NEGATIVE
Hep B C IgM: NEGATIVE

## 2011-03-26 LAB — ANA: Anti Nuclear Antibody(ANA): NEGATIVE

## 2011-03-27 NOTE — Progress Notes (Signed)
Quick Note:  Pt was informed. ______

## 2011-04-03 NOTE — Progress Notes (Signed)
Results Cc to PCP  

## 2011-04-05 MED ORDER — SODIUM CHLORIDE 0.45 % IV SOLN: Freq: Once | INTRAVENOUS | Status: DC

## 2011-04-08 ENCOUNTER — Encounter (HOSPITAL_COMMUNITY): Admission: RE | Payer: Self-pay | Source: Ambulatory Visit

## 2011-04-08 ENCOUNTER — Telehealth: Payer: Self-pay

## 2011-04-08 ENCOUNTER — Ambulatory Visit (HOSPITAL_COMMUNITY): Admission: RE | Admit: 2011-04-08 | Payer: Medicare Other | Source: Ambulatory Visit | Admitting: Gastroenterology

## 2011-04-08 SURGERY — ESOPHAGOGASTRODUODENOSCOPY (EGD) WITH ESOPHAGEAL DILATION
Anesthesia: Moderate Sedation

## 2011-04-08 NOTE — Telephone Encounter (Signed)
Pt left VM on Sunday at 7:11 PM that she needed to cancel appt for EGD today. I called this AM. Female answered. Said she will call back in week or so to reschedule, they have a family crisis with someone in the hospital in ICU. I called ENDO and informed Bethena Roys. I also called and LMOM for Kim.

## 2011-04-22 DIAGNOSIS — J384 Edema of larynx: Secondary | ICD-10-CM | POA: Insufficient documentation

## 2011-05-06 ENCOUNTER — Telehealth: Payer: Self-pay | Admitting: Family Medicine

## 2011-05-06 NOTE — Telephone Encounter (Signed)
Coughing up brownish/yellow phlegm, greenish nasal congestion. Has been doing nettie pot with alker seltzer cold and flu. Some chills and body aches, comes and goes since the day after Christmas. Eyes watering and sinus pressure. Dr Moshe Cipro states urgent care. No treatment without eval. Patient aware.

## 2011-05-08 ENCOUNTER — Telehealth: Payer: Self-pay | Admitting: Gastroenterology

## 2011-05-08 NOTE — Telephone Encounter (Signed)
Pt called wanting antibiotics for a sinus infection- she stated her pcp Dr Buelah Manis was on maternity leave and she did want want to see Dr Moshe Cipro who is covering for Dr Buelah Manis- she can be reached at home

## 2011-05-08 NOTE — Telephone Encounter (Signed)
REVIEWED. AGREE. 

## 2011-05-08 NOTE — Telephone Encounter (Signed)
Called and spoke with pt. Told her she needs to call Dr. Moshe Cipro and make appt. For OV , needs to be seen if she is having problems. Told her we are GI only.

## 2011-05-13 ENCOUNTER — Encounter: Payer: Self-pay | Admitting: Family Medicine

## 2011-05-13 ENCOUNTER — Ambulatory Visit (INDEPENDENT_AMBULATORY_CARE_PROVIDER_SITE_OTHER): Payer: Medicare Other | Admitting: Family Medicine

## 2011-05-13 DIAGNOSIS — F17200 Nicotine dependence, unspecified, uncomplicated: Secondary | ICD-10-CM | POA: Diagnosis not present

## 2011-05-13 DIAGNOSIS — Z720 Tobacco use: Secondary | ICD-10-CM

## 2011-05-13 DIAGNOSIS — F17210 Nicotine dependence, cigarettes, uncomplicated: Secondary | ICD-10-CM | POA: Insufficient documentation

## 2011-05-13 DIAGNOSIS — J449 Chronic obstructive pulmonary disease, unspecified: Secondary | ICD-10-CM

## 2011-05-13 DIAGNOSIS — J019 Acute sinusitis, unspecified: Secondary | ICD-10-CM | POA: Diagnosis not present

## 2011-05-13 DIAGNOSIS — I10 Essential (primary) hypertension: Secondary | ICD-10-CM

## 2011-05-13 MED ORDER — FLUTICASONE PROPIONATE 50 MCG/ACT NA SUSP: 1.0000 | Freq: Two times a day (BID) | NASAL | Status: DC

## 2011-05-13 MED ORDER — AMOXICILLIN-POT CLAVULANATE 875-125 MG PO TABS: 1.0000 | ORAL_TABLET | Freq: Two times a day (BID) | ORAL | Status: AC

## 2011-05-13 NOTE — Assessment & Plan Note (Signed)
Counseled on importance of cessation 

## 2011-05-13 NOTE — Assessment & Plan Note (Signed)
Augmentin x 10 days, flonase

## 2011-05-13 NOTE — Progress Notes (Signed)
  Subjective:    Patient ID: [name] [name], female    DOB: Dec 11, 1955, 56 y.o.   MRN: [id number]  HPI  Cough congestion, sinus pressure x 10 days Tried Netty pot, Theraflu , had mild nose bleed Has used albuterol a few more times than normal No sick contacts  Did not receive flu shot Review of Systems    GEN- subjective fever, no weight loss   HEENT- +nasal discharge, sinus pressure,denies sore throat, + post nasal drip   CVS- denies CP   RESP- denies SOB, + cough, no wheeze        Objective:   Physical Exam GEN- NAD, alert and oriented x3 HEENT- PERRL, EOMI, non injected sclera, pink conjunctiva, MMM, oropharynx injected, TM clear bilat, enlarged nasal turbinates, +sinus pressure maxillary and mandibular, green discharge right nares Neck- Supple, no thryomegaly CVS- RRR, no murmur RESP-CTAB EXT- No edema Pulses- Radial, DP- 2+        Assessment & Plan:

## 2011-05-13 NOTE — Assessment & Plan Note (Signed)
Well controlled despite OTC meds

## 2011-05-13 NOTE — Patient Instructions (Signed)
For your sinus infection take the antibiotics as prescribed- Augmentin Use the nasal spray Drink plenty of fluids If you can, try pro-biotics Schedule a physical

## 2011-05-13 NOTE — Assessment & Plan Note (Signed)
Currently compensated

## 2011-05-15 ENCOUNTER — Ambulatory Visit (INDEPENDENT_AMBULATORY_CARE_PROVIDER_SITE_OTHER): Payer: Medicare Other | Admitting: Gastroenterology

## 2011-05-15 ENCOUNTER — Encounter: Payer: Self-pay | Admitting: Gastroenterology

## 2011-05-15 DIAGNOSIS — R1310 Dysphagia, unspecified: Secondary | ICD-10-CM

## 2011-05-15 NOTE — Assessment & Plan Note (Signed)
Continues.  Pt would like to wait for EGD/DIL. OPV IN 4 MOS. VICODIN 5/325 #1 MO SUPPLY RFX3 GIVEN.

## 2011-05-15 NOTE — Progress Notes (Signed)
Cc to PCP 

## 2011-05-15 NOTE — Progress Notes (Signed)
Reminder in epic to follow up in 4 months/E30

## 2011-05-15 NOTE — Progress Notes (Signed)
Subjective:    Patient ID: [name] [name], female    DOB: 1955/10/01, 56 y.o.   MRN: [id number]  PCP: [place]  HPI HAS A SINUS INFECTION. Mother-In-law is in [facility] and can't walk. WANTS TO WAIT UNTIL SHE'S OVER HER SINUS INFECTION BEFORE SHE HAS HER EGD/DIL? YESTERDAY "KINDA" CONSTIPATED. Been on Augmentin for 3 pills-today has loose stools. Dr. Sarajane Jews put HER Protonix BID and it makes her dizzy.  Past Medical History  Diagnosis Date  . Elevated liver enzymes 2009: BMI 24 ?Etoh ALT 94, AST 51,  NEG ANA, qIGs& ASMA    MAY 2012 AST 52 ALT 38  . Anastomotic ulcer JAN 2009  . Esophageal stricture 2009  . Diarrhea MULITIFACTORIAL    IBS, LACTOSE INTOLERANCE, SBBO, BILE-SALT  . Inflammatory bowel disease 2006 CD    SURGICAL REMISSION  . LBP (low back pain)   . Migraine   . Asthma   . Allergic rhinitis   . CTS (carpal tunnel syndrome)   . Hyperlipemia   . Anxiety   . Hemorrhoid   . Crohn disease   . BMI (body mass index) 20.0-29.9 2009 121 lbs  . 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 Hemicolectomy RIGHT 2006  . Hernia repair   . Carpal tunnel release LEFT  . Lumbar disc surgery   . Vocal cord surgery Sep 20, 2010    precancerous areas removed  . Esophagogastroduodenoscopy 02/07/09    mild gastritis  . Colonoscopy JAN 2009 DIARRHEA, ABD PAIN, BRBPR    ANASTOMOTIC ULCER(3MM), IH DZ:HGDJME ULCER, NL COLON bX  . Upper gastrointestinal endoscopy JAN 2009 ABD PAIN    GASTRITIS, ESO RING  . Upper gastrointestinal endoscopy OCT 2010 ABD PAIN, DYSPHAGIA    DIL 15MM, GASTRITIS, NL DUODENUM  . Upper gastrointestinal endoscopy OCT 2010 DYSPHAGIA    DIL 17 MM, GASTRITIS, NL DUODENUM    Allergies  Allergen Reactions  . Lovastatin     Chest pain    Current Outpatient Prescriptions  Medication Sig Dispense Refill  . albuterol (PROAIR HFA) 108 (90 BASE) MCG/ACT inhaler Inhale 2 puffs into the lungs every 6 (six) hours as needed.      Marland Kitchen  amLODipine (NORVASC) 5 MG tablet Take 5 mg by mouth daily.      Marland Kitchen amoxicillin-clavulanate (AUGMENTIN) 875-125 MG per tablet Take 1 tablet by mouth every 12 (twelve) hours.    . budesonide-formoterol (SYMBICORT) 160-4.5 MCG/ACT inhaler Inhale 2 puffs into the lungs 2 (two) times daily.    . calcium carbonate (TUMS - DOSED IN MG ELEMENTAL CALCIUM) 500 MG chewable tablet Chew 1 tablet by mouth daily.      . diazepam (VALIUM) 5 MG tablet TAKE ONE HALF TABLET BY MOUTH TWICE DAILY    . fluticasone (FLONASE) 50 MCG/ACT nasal spray Place 1 spray into the nose 2 (two) times daily.    Marland Kitchen HYDROcodone-acetaminophen (VICODIN) 5-500 MG per tablet Take 1 tablet by mouth 3 (three) times daily.    . pantoprazole (PROTONIX) 40 MG tablet Take 40 mg by mouth daily.    . quinapril-hydrochlorothiazide (ACCURETIC) 10-12.5 MG per tablet Take 1 tablet by mouth daily.         Review of Systems     Objective:   Physical Exam  Vitals reviewed. Constitutional: She is oriented to person, place, and time. She appears well-nourished. No distress.  HENT:  Head: Normocephalic and atraumatic.  Mouth/Throat: Oropharynx is clear and moist.  No oropharyngeal exudate.  Eyes: Pupils are equal, round, and reactive to light. No scleral icterus.  Neck: Normal range of motion. Neck supple.  Cardiovascular: Normal rate and regular rhythm.   Pulmonary/Chest: Effort normal and breath sounds normal. No respiratory distress.  Abdominal: Soft. Bowel sounds are normal. She exhibits no distension. There is no tenderness.  Musculoskeletal: Normal range of motion. She exhibits no edema.  Lymphadenopathy:    She has no cervical adenopathy.  Neurological: She is oriented to person, place, and time.       NO FOCAL DEFICITS           Assessment & Plan:

## 2011-05-15 NOTE — Progress Notes (Signed)
Reminder in epic to follow up in 4 months with SF/E30

## 2011-05-15 NOTE — Assessment & Plan Note (Signed)
ETIOLOGY UNKNOWN.  NEEDS LIVER BIOPSY. PT WOULD LIKE TO WAIT UNTIL AFTER EGD/DIL. OPV IN 4 MOS. PT NEEDS RECHECK HFP.

## 2011-05-22 ENCOUNTER — Other Ambulatory Visit: Payer: Self-pay | Admitting: Family Medicine

## 2011-05-23 ENCOUNTER — Other Ambulatory Visit: Payer: Self-pay

## 2011-05-23 MED ORDER — DIAZEPAM 5 MG PO TABS: 2.5000 mg | ORAL_TABLET | Freq: Two times a day (BID) | ORAL | Status: DC

## 2011-05-24 ENCOUNTER — Other Ambulatory Visit: Payer: Self-pay | Admitting: Family Medicine

## 2011-06-19 ENCOUNTER — Ambulatory Visit (INDEPENDENT_AMBULATORY_CARE_PROVIDER_SITE_OTHER): Payer: Medicare Other | Admitting: Family Medicine

## 2011-06-19 ENCOUNTER — Telehealth: Payer: Self-pay

## 2011-06-19 ENCOUNTER — Encounter: Payer: Self-pay | Admitting: Family Medicine

## 2011-06-19 VITALS — BP 132/80 | HR 112 | Resp 20 | Ht 60.0 in | Wt 126.0 lb

## 2011-06-19 DIAGNOSIS — IMO0002 Reserved for concepts with insufficient information to code with codable children: Secondary | ICD-10-CM | POA: Diagnosis not present

## 2011-06-19 MED ORDER — METHYLPREDNISOLONE ACETATE 40 MG/ML IJ SUSP
40.0000 mg | Freq: Once | INTRAMUSCULAR | Status: AC
  Administered 2011-06-19: 40 mg via INTRAMUSCULAR

## 2011-06-19 MED ORDER — PREDNISONE 10 MG PO TABS: ORAL_TABLET | ORAL | Status: DC

## 2011-06-19 MED ORDER — OXYCODONE-ACETAMINOPHEN 5-325 MG PO TABS: 1.0000 | ORAL_TABLET | Freq: Three times a day (TID) | ORAL | Status: AC | PRN

## 2011-06-19 NOTE — Telephone Encounter (Signed)
T/C call from Dr. Buelah Manis. She just wanted to let Dr. Oneida Alar know that she is prescribing Percocet to pt for back pain. She is aware that pt has contract with Dr. Oneida Alar for pain meds and wanted her to know this so she would not think pt is breaking her contract with her. Rx is for Percocet 5/325 #60 tablets and one tid with no refills.

## 2011-06-19 NOTE — Telephone Encounter (Signed)
REVIEWED.  

## 2011-06-19 NOTE — Progress Notes (Signed)
  Subjective:    Patient ID: [name] [name], female    DOB: Jun 25, 1955, 56 y.o.   MRN: [id number]  HPI   Patient has history of chronic low back pain with noted disc disease and sciatica. She is status post surgical intervention 2 years ago. She was moving furniture with her family 2 weeks ago and since then has had back pain. It became more severe the past 2 days. She typically takes hydrocodone for her stomach however this has not been helping. She has pain in the low back that radiates down the left leg and into the groin at times. It feels very similar to her previous sciatica pain. She is unable to sit or stand for long periods of time without severe pain. No change in bowel or bladder.   Review of Systems  - per above     Objective:   Physical Exam GEN- appears to be in pain, sitting sideways in chair, no cardiopulmonary distress, alert and oriented Gait- analgic MSK- TTP lumbar spine , left buttocks and thigh, limited exam secondary to pain, +SLR, pain with IR/ER , strength decreased compared to left- but difficult because of pain,  Neuro- sensation grossly in tact Lower ext, DTR symmetric bilat       Assessment & Plan:

## 2011-06-19 NOTE — Patient Instructions (Signed)
You have been given a shot of steroid You pain medication as been increased to Percocet for a short term only Take the prednisone pills as prescribed Get the x-ray I will call on Friday, if not improved you will be sent back to Dr. Arnoldo Morale

## 2011-06-20 ENCOUNTER — Ambulatory Visit (HOSPITAL_COMMUNITY)
Admission: RE | Admit: 2011-06-20 | Discharge: 2011-06-20 | Disposition: A | Discharge status: Home / Self Care | Payer: Medicare Other | Source: Ambulatory Visit | Attending: Family Medicine | Admitting: Family Medicine

## 2011-06-20 DIAGNOSIS — M25559 Pain in unspecified hip: Secondary | ICD-10-CM | POA: Diagnosis not present

## 2011-06-20 DIAGNOSIS — M545 Low back pain, unspecified: Secondary | ICD-10-CM | POA: Insufficient documentation

## 2011-06-20 DIAGNOSIS — Z9889 Other specified postprocedural states: Secondary | ICD-10-CM | POA: Diagnosis not present

## 2011-06-20 DIAGNOSIS — IMO0002 Reserved for concepts with insufficient information to code with codable children: Secondary | ICD-10-CM

## 2011-06-20 NOTE — Assessment & Plan Note (Signed)
Patient with acute flare of back. Likely related to the moving of furniture. She appears to be in very severe pain however is able to ambulate. At this time II will give a short dose of steroids for inflammation and she has difficulty with anti-inflammatories. She will be started on Percocet for the next 2-1/2 weeks that she is on chronic Vicodin and this is not helping. Her gastroenterologist was notified of the additional narcotic medication with whom she has a pain contract with. Obtain lumbar film, if no improvement will send back to her neurosurgeon for evaluation

## 2011-06-21 ENCOUNTER — Telehealth: Payer: Self-pay | Admitting: Family Medicine

## 2011-06-21 DIAGNOSIS — M47816 Spondylosis without myelopathy or radiculopathy, lumbar region: Secondary | ICD-10-CM

## 2011-06-21 DIAGNOSIS — M5416 Radiculopathy, lumbar region: Secondary | ICD-10-CM

## 2011-06-21 DIAGNOSIS — G8929 Other chronic pain: Secondary | ICD-10-CM

## 2011-06-21 DIAGNOSIS — M5430 Sciatica, unspecified side: Secondary | ICD-10-CM

## 2011-06-24 ENCOUNTER — Ambulatory Visit: Payer: Medicare Other | Admitting: Family Medicine

## 2011-06-24 ENCOUNTER — Telehealth: Payer: Self-pay

## 2011-06-24 NOTE — Telephone Encounter (Signed)
I spoke with patient. She continues to have severe back pain. Her x-ray shows arthritis and previous remote changes. There is no change the disc space however I cannot evaluate the neuropathy with the x-ray. She's not improved will obtain MRI and referred to neurosurgery Dr. Arnoldo Morale

## 2011-06-24 NOTE — Telephone Encounter (Signed)
States

## 2011-06-24 NOTE — Telephone Encounter (Signed)
MRI

## 2011-06-27 ENCOUNTER — Ambulatory Visit (HOSPITAL_COMMUNITY)
Admission: RE | Admit: 2011-06-27 | Discharge: 2011-06-27 | Disposition: A | Discharge status: Home / Self Care | Payer: Medicare Other | Source: Ambulatory Visit | Attending: Family Medicine | Admitting: Family Medicine

## 2011-06-27 ENCOUNTER — Ambulatory Visit (HOSPITAL_COMMUNITY): Payer: Medicare Other

## 2011-06-27 DIAGNOSIS — M545 Low back pain, unspecified: Secondary | ICD-10-CM | POA: Insufficient documentation

## 2011-06-27 DIAGNOSIS — M5126 Other intervertebral disc displacement, lumbar region: Secondary | ICD-10-CM | POA: Diagnosis not present

## 2011-06-27 DIAGNOSIS — Z9889 Other specified postprocedural states: Secondary | ICD-10-CM | POA: Diagnosis not present

## 2011-06-27 DIAGNOSIS — M5430 Sciatica, unspecified side: Secondary | ICD-10-CM

## 2011-06-27 DIAGNOSIS — M47816 Spondylosis without myelopathy or radiculopathy, lumbar region: Secondary | ICD-10-CM

## 2011-06-27 DIAGNOSIS — G8929 Other chronic pain: Secondary | ICD-10-CM

## 2011-06-28 ENCOUNTER — Encounter: Payer: Medicare Other | Admitting: Family Medicine

## 2011-06-28 NOTE — Telephone Encounter (Signed)
Opened in error

## 2011-07-05 DIAGNOSIS — M999 Biomechanical lesion, unspecified: Secondary | ICD-10-CM | POA: Diagnosis not present

## 2011-07-05 DIAGNOSIS — M545 Low back pain: Secondary | ICD-10-CM | POA: Diagnosis not present

## 2011-07-05 DIAGNOSIS — IMO0002 Reserved for concepts with insufficient information to code with codable children: Secondary | ICD-10-CM | POA: Diagnosis not present

## 2011-07-08 DIAGNOSIS — M545 Low back pain: Secondary | ICD-10-CM | POA: Diagnosis not present

## 2011-07-08 DIAGNOSIS — IMO0002 Reserved for concepts with insufficient information to code with codable children: Secondary | ICD-10-CM | POA: Diagnosis not present

## 2011-07-08 DIAGNOSIS — M999 Biomechanical lesion, unspecified: Secondary | ICD-10-CM | POA: Diagnosis not present

## 2011-07-10 DIAGNOSIS — M999 Biomechanical lesion, unspecified: Secondary | ICD-10-CM | POA: Diagnosis not present

## 2011-07-10 DIAGNOSIS — M545 Low back pain: Secondary | ICD-10-CM | POA: Diagnosis not present

## 2011-07-10 DIAGNOSIS — IMO0002 Reserved for concepts with insufficient information to code with codable children: Secondary | ICD-10-CM | POA: Diagnosis not present

## 2011-07-12 DIAGNOSIS — IMO0002 Reserved for concepts with insufficient information to code with codable children: Secondary | ICD-10-CM | POA: Diagnosis not present

## 2011-07-12 DIAGNOSIS — M545 Low back pain: Secondary | ICD-10-CM | POA: Diagnosis not present

## 2011-07-12 DIAGNOSIS — M999 Biomechanical lesion, unspecified: Secondary | ICD-10-CM | POA: Diagnosis not present

## 2011-07-15 ENCOUNTER — Telehealth: Payer: Self-pay | Admitting: Family Medicine

## 2011-07-15 DIAGNOSIS — M999 Biomechanical lesion, unspecified: Secondary | ICD-10-CM | POA: Diagnosis not present

## 2011-07-15 DIAGNOSIS — M545 Low back pain: Secondary | ICD-10-CM | POA: Diagnosis not present

## 2011-07-15 DIAGNOSIS — IMO0002 Reserved for concepts with insufficient information to code with codable children: Secondary | ICD-10-CM | POA: Diagnosis not present

## 2011-07-15 NOTE — Telephone Encounter (Signed)
That was a one time change in her pain contract, please get the note from her neurosurgeon.

## 2011-07-15 NOTE — Telephone Encounter (Signed)
Was this a one time rx?

## 2011-07-16 ENCOUNTER — Telehealth: Payer: Self-pay

## 2011-07-16 MED ORDER — OXYCODONE-ACETAMINOPHEN 5-325 MG PO TABS: 1.0000 | ORAL_TABLET | Freq: Three times a day (TID) | ORAL | Status: AC | PRN

## 2011-07-16 NOTE — Telephone Encounter (Signed)
I will give her the percocet to last until the appt only She needs to continue with the pain contact with Dr. Oneida Alar Please call and notify Dr. Oneida Alar office she was given another script

## 2011-07-16 NOTE — Telephone Encounter (Signed)
Patient aware.

## 2011-07-16 NOTE — Telephone Encounter (Signed)
Faxed to get notes from neuro

## 2011-07-16 NOTE — Telephone Encounter (Signed)
Not gonna be able to get in to see neuro until Mar 26. Needs meds. Said to review the MRI please because her meds will run out tomorrow

## 2011-07-17 NOTE — Telephone Encounter (Signed)
07/16/11 PT RECEIVED PERCOCET #30 FROM DR. Buelah Manis.

## 2011-07-18 DIAGNOSIS — IMO0002 Reserved for concepts with insufficient information to code with codable children: Secondary | ICD-10-CM | POA: Diagnosis not present

## 2011-07-18 DIAGNOSIS — M545 Low back pain: Secondary | ICD-10-CM | POA: Diagnosis not present

## 2011-07-18 DIAGNOSIS — M999 Biomechanical lesion, unspecified: Secondary | ICD-10-CM | POA: Diagnosis not present

## 2011-07-24 DIAGNOSIS — M999 Biomechanical lesion, unspecified: Secondary | ICD-10-CM | POA: Diagnosis not present

## 2011-07-24 DIAGNOSIS — M545 Low back pain: Secondary | ICD-10-CM | POA: Diagnosis not present

## 2011-07-24 DIAGNOSIS — IMO0002 Reserved for concepts with insufficient information to code with codable children: Secondary | ICD-10-CM | POA: Diagnosis not present

## 2011-07-25 ENCOUNTER — Telehealth: Payer: Self-pay | Admitting: Family Medicine

## 2011-07-25 MED ORDER — DIAZEPAM 5 MG PO TABS: 2.5000 mg | ORAL_TABLET | Freq: Two times a day (BID) | ORAL | Status: DC

## 2011-07-25 NOTE — Telephone Encounter (Signed)
Is this ok?

## 2011-07-25 NOTE — Telephone Encounter (Signed)
Chronic med

## 2011-07-26 DIAGNOSIS — IMO0002 Reserved for concepts with insufficient information to code with codable children: Secondary | ICD-10-CM | POA: Diagnosis not present

## 2011-07-26 DIAGNOSIS — M545 Low back pain: Secondary | ICD-10-CM | POA: Diagnosis not present

## 2011-07-26 DIAGNOSIS — M999 Biomechanical lesion, unspecified: Secondary | ICD-10-CM | POA: Diagnosis not present

## 2011-07-29 DIAGNOSIS — M545 Low back pain: Secondary | ICD-10-CM | POA: Diagnosis not present

## 2011-07-29 DIAGNOSIS — IMO0002 Reserved for concepts with insufficient information to code with codable children: Secondary | ICD-10-CM | POA: Diagnosis not present

## 2011-07-29 DIAGNOSIS — M999 Biomechanical lesion, unspecified: Secondary | ICD-10-CM | POA: Diagnosis not present

## 2011-07-31 ENCOUNTER — Telehealth: Payer: Self-pay | Admitting: Gastroenterology

## 2011-07-31 DIAGNOSIS — M545 Low back pain: Secondary | ICD-10-CM | POA: Diagnosis not present

## 2011-07-31 DIAGNOSIS — M999 Biomechanical lesion, unspecified: Secondary | ICD-10-CM | POA: Diagnosis not present

## 2011-07-31 DIAGNOSIS — IMO0002 Reserved for concepts with insufficient information to code with codable children: Secondary | ICD-10-CM | POA: Diagnosis not present

## 2011-07-31 NOTE — Telephone Encounter (Signed)
Patient needs to let the nurse & Dr. Oneida Alar know that she has seen Dr. Newman Pies for her back and she does not have to have back surgery and she needs to see chiropractor Dr. Lovena Le & the meds Dr. Arnoldo Morale put her on percocet 5/315m and gabappntin 3072mand patient wants Dr. FiOneida Alarware

## 2011-07-31 NOTE — Telephone Encounter (Signed)
REVIEWED.  

## 2011-08-05 DIAGNOSIS — M999 Biomechanical lesion, unspecified: Secondary | ICD-10-CM | POA: Diagnosis not present

## 2011-08-05 DIAGNOSIS — IMO0002 Reserved for concepts with insufficient information to code with codable children: Secondary | ICD-10-CM | POA: Diagnosis not present

## 2011-08-05 DIAGNOSIS — M545 Low back pain: Secondary | ICD-10-CM | POA: Diagnosis not present

## 2011-08-07 DIAGNOSIS — M545 Low back pain: Secondary | ICD-10-CM | POA: Diagnosis not present

## 2011-08-07 DIAGNOSIS — M999 Biomechanical lesion, unspecified: Secondary | ICD-10-CM | POA: Diagnosis not present

## 2011-08-07 DIAGNOSIS — IMO0002 Reserved for concepts with insufficient information to code with codable children: Secondary | ICD-10-CM | POA: Diagnosis not present

## 2011-08-19 DIAGNOSIS — M545 Low back pain: Secondary | ICD-10-CM | POA: Diagnosis not present

## 2011-08-19 DIAGNOSIS — M999 Biomechanical lesion, unspecified: Secondary | ICD-10-CM | POA: Diagnosis not present

## 2011-08-19 DIAGNOSIS — IMO0002 Reserved for concepts with insufficient information to code with codable children: Secondary | ICD-10-CM | POA: Diagnosis not present

## 2011-08-21 DIAGNOSIS — IMO0002 Reserved for concepts with insufficient information to code with codable children: Secondary | ICD-10-CM | POA: Diagnosis not present

## 2011-08-21 DIAGNOSIS — M545 Low back pain: Secondary | ICD-10-CM | POA: Diagnosis not present

## 2011-08-21 DIAGNOSIS — M999 Biomechanical lesion, unspecified: Secondary | ICD-10-CM | POA: Diagnosis not present

## 2011-09-02 DIAGNOSIS — M999 Biomechanical lesion, unspecified: Secondary | ICD-10-CM | POA: Diagnosis not present

## 2011-09-02 DIAGNOSIS — IMO0002 Reserved for concepts with insufficient information to code with codable children: Secondary | ICD-10-CM | POA: Diagnosis not present

## 2011-09-02 DIAGNOSIS — M545 Low back pain: Secondary | ICD-10-CM | POA: Diagnosis not present

## 2011-09-04 DIAGNOSIS — IMO0002 Reserved for concepts with insufficient information to code with codable children: Secondary | ICD-10-CM | POA: Diagnosis not present

## 2011-09-04 DIAGNOSIS — M999 Biomechanical lesion, unspecified: Secondary | ICD-10-CM | POA: Diagnosis not present

## 2011-09-04 DIAGNOSIS — M545 Low back pain: Secondary | ICD-10-CM | POA: Diagnosis not present

## 2011-09-06 ENCOUNTER — Encounter: Payer: Self-pay | Admitting: Gastroenterology

## 2011-09-11 DIAGNOSIS — M999 Biomechanical lesion, unspecified: Secondary | ICD-10-CM | POA: Diagnosis not present

## 2011-09-11 DIAGNOSIS — IMO0002 Reserved for concepts with insufficient information to code with codable children: Secondary | ICD-10-CM | POA: Diagnosis not present

## 2011-09-11 DIAGNOSIS — M545 Low back pain: Secondary | ICD-10-CM | POA: Diagnosis not present

## 2011-09-18 DIAGNOSIS — IMO0002 Reserved for concepts with insufficient information to code with codable children: Secondary | ICD-10-CM | POA: Diagnosis not present

## 2011-09-18 DIAGNOSIS — M999 Biomechanical lesion, unspecified: Secondary | ICD-10-CM | POA: Diagnosis not present

## 2011-09-18 DIAGNOSIS — M545 Low back pain: Secondary | ICD-10-CM | POA: Diagnosis not present

## 2011-09-24 ENCOUNTER — Telehealth: Payer: Self-pay | Admitting: Family Medicine

## 2011-09-24 MED ORDER — AMLODIPINE BESYLATE 5 MG PO TABS: 5.0000 mg | ORAL_TABLET | Freq: Every day | ORAL | Status: DC

## 2011-09-24 NOTE — Telephone Encounter (Signed)
Ok to refill her BP meds (I don't see where they have been filled from [place])

## 2011-09-24 NOTE — Telephone Encounter (Signed)
done

## 2011-09-25 ENCOUNTER — Other Ambulatory Visit: Payer: Self-pay | Admitting: Family Medicine

## 2011-10-04 DIAGNOSIS — M545 Low back pain: Secondary | ICD-10-CM | POA: Diagnosis not present

## 2011-10-04 DIAGNOSIS — IMO0002 Reserved for concepts with insufficient information to code with codable children: Secondary | ICD-10-CM | POA: Diagnosis not present

## 2011-10-04 DIAGNOSIS — M999 Biomechanical lesion, unspecified: Secondary | ICD-10-CM | POA: Diagnosis not present

## 2011-10-16 ENCOUNTER — Ambulatory Visit: Payer: Medicare Other | Admitting: Gastroenterology

## 2011-10-17 ENCOUNTER — Ambulatory Visit: Payer: Medicare Other | Admitting: Gastroenterology

## 2011-10-29 DIAGNOSIS — M5106 Intervertebral disc disorders with myelopathy, lumbar region: Secondary | ICD-10-CM | POA: Diagnosis not present

## 2011-11-01 DIAGNOSIS — M5430 Sciatica, unspecified side: Secondary | ICD-10-CM | POA: Diagnosis not present

## 2011-11-01 DIAGNOSIS — M5137 Other intervertebral disc degeneration, lumbosacral region: Secondary | ICD-10-CM | POA: Diagnosis not present

## 2011-11-01 DIAGNOSIS — M999 Biomechanical lesion, unspecified: Secondary | ICD-10-CM | POA: Diagnosis not present

## 2011-11-13 DIAGNOSIS — M5137 Other intervertebral disc degeneration, lumbosacral region: Secondary | ICD-10-CM | POA: Diagnosis not present

## 2011-11-13 DIAGNOSIS — M5430 Sciatica, unspecified side: Secondary | ICD-10-CM | POA: Diagnosis not present

## 2011-11-13 DIAGNOSIS — M999 Biomechanical lesion, unspecified: Secondary | ICD-10-CM | POA: Diagnosis not present

## 2011-11-29 ENCOUNTER — Other Ambulatory Visit: Payer: Self-pay

## 2011-12-02 ENCOUNTER — Other Ambulatory Visit: Payer: Self-pay

## 2011-12-02 MED ORDER — DIAZEPAM 5 MG PO TABS: 2.5000 mg | ORAL_TABLET | Freq: Two times a day (BID) | ORAL | Status: DC

## 2011-12-11 ENCOUNTER — Ambulatory Visit (INDEPENDENT_AMBULATORY_CARE_PROVIDER_SITE_OTHER): Payer: Medicare Other | Admitting: Gastroenterology

## 2011-12-11 ENCOUNTER — Other Ambulatory Visit: Payer: Self-pay | Admitting: Gastroenterology

## 2011-12-11 ENCOUNTER — Encounter: Payer: Self-pay | Admitting: Gastroenterology

## 2011-12-11 VITALS — BP 128/76 | HR 92 | Temp 98.0°F | Ht 60.0 in | Wt 126.6 lb

## 2011-12-11 DIAGNOSIS — M545 Low back pain: Secondary | ICD-10-CM | POA: Diagnosis not present

## 2011-12-11 DIAGNOSIS — R1310 Dysphagia, unspecified: Secondary | ICD-10-CM | POA: Diagnosis not present

## 2011-12-11 DIAGNOSIS — M999 Biomechanical lesion, unspecified: Secondary | ICD-10-CM | POA: Diagnosis not present

## 2011-12-11 DIAGNOSIS — M5430 Sciatica, unspecified side: Secondary | ICD-10-CM | POA: Diagnosis not present

## 2011-12-11 MED ORDER — MAGIC MOUTHWASH: ORAL | Status: DC

## 2011-12-11 MED ORDER — PEG 3350-KCL-NA BICARB-NACL 420 G PO SOLR: 4000.0000 L | ORAL | Status: AC

## 2011-12-11 NOTE — Assessment & Plan Note (Addendum)
NO S/X OF HEPATITIS.  RECHECK HFP. IF ELEVATED, WILL STRONGLY ENCOURAGE PT TO HAVE A LIVER BIOPSY.

## 2011-12-11 NOTE — Patient Instructions (Signed)
COMPLETE LIVER TEST.  UPPER ENDOSCOPY TO STRETCH YOU ON AUG 20.  FOLLOW UP IN 4 MOS.

## 2011-12-11 NOTE — Progress Notes (Signed)
Faxed to PCP

## 2011-12-11 NOTE — Progress Notes (Signed)
Subjective:    Patient ID: [name] [name], female    DOB: 30-Oct-1955, 56 y.o.   MRN: [id number]  PCP: [facility]  HPI NO NEED TO REFILL VICODIN 5/500 OR PROTONIX. SWALLOWING PROBLEMS:  EVERY DAY (SOLID ONLY). ALSO PILLS. READY TO BE STRETCHED. SQUIRTS AFTER RAW SPINACH AND RAW BROCCOLI. BMs: 2-3 TIMES A DAY. MAY HAVE A GOOD BM THEN DIARRHEA. QUIT SMOKING. SIGNIFICANT OTHER FOR 13 YEARS NOW STILL SMOKES. NO JAUNDICE OR ITCHING. LAST HFP NOV 2012 ELEVATED AST/ALT.  Past Medical History  Diagnosis Date  . Elevated liver enzymes 2009: BMI 24 ?Etoh ALT 94, AST 51,  NEG ANA, qIGs& ASMA    MAY 2012 AST 52 ALT 38  . Anastomotic ulcer JAN 2009  . Esophageal stricture 2009  . Diarrhea MULITIFACTORIAL    IBS, LACTOSE INTOLERANCE, SBBO, BILE-SALT  . Inflammatory bowel disease 2006 CD    SURGICAL REMISSION  . LBP (low back pain)   . Migraine   . Asthma   . Allergic rhinitis   . CTS (carpal tunnel syndrome)   . Hyperlipemia   . Anxiety   . Hemorrhoid   . Crohn disease   . BMI (body mass index) 20.0-29.9 2009 121 lbs  . 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 Hemicolectomy RIGHT 2006  . Hernia repair   . Carpal tunnel release LEFT  . Lumbar disc surgery   . Vocal cord surgery Sep 20, 2010    precancerous areas removed  . Esophagogastroduodenoscopy 02/07/09    mild gastritis  . Colonoscopy JAN 2009 DIARRHEA, ABD PAIN, BRBPR    ANASTOMOTIC ULCER(3MM), IH QR:FXJOIT ULCER, NL COLON bX  . Upper gastrointestinal endoscopy JAN 2009 ABD PAIN    GASTRITIS, ESO RING  . Upper gastrointestinal endoscopy OCT 2010 ABD PAIN, DYSPHAGIA    DIL 15MM, GASTRITIS, NL DUODENUM  . Upper gastrointestinal endoscopy OCT 2010 DYSPHAGIA    DIL 17 MM, GASTRITIS, NL DUODENUM    Allergies  Allergen Reactions  . Lovastatin     Chest pain    Current Outpatient Prescriptions  Medication Sig Dispense Refill  . albuterol (PROAIR HFA) 108 (90 BASE) MCG/ACT inhaler Inhale 2  puffs into the lungs every 6 (six) hours as needed.        Marland Kitchen amLODipine (NORVASC) 5 MG tablet Take 1 tablet (5 mg total) by mouth daily.    . budesonide-formoterol (SYMBICORT) 160-4.5 MCG/ACT inhaler Inhale 2 puffs into the lungs 2 (two) times daily.    . calcium carbonate (TUMS - DOSED IN MG ELEMENTAL CALCIUM) 500 MG chewable tablet Chew 1 tablet by mouth daily.      . diazepam (VALIUM) 5 MG tablet Take 0.5 tablets (2.5 mg total) by mouth 2 (two) times daily.    . fluticasone (FLONASE) 50 MCG/ACT nasal spray Place 1 spray into the nose 2 (two) times daily.    Marland Kitchen gabapentin (NEURONTIN) 300 MG capsule Take 300 mg by mouth 3 (three) times daily.     Marland Kitchen HYDROcodone-acetaminophen (NORCO) 10-325 MG per tablet Take 1 tablet by mouth every 4 (four) hours as needed.     . pantoprazole (PROTONIX) 40 MG tablet Take 40 mg by mouth daily.    .      . quinapril-hydrochlorothiazide (ACCURETIC) 10-12.5 MG per tablet TAKE ONE TABLET BY MOUTH EVERY DAY    . DUKES MOUTH WASH 1 TSP PO SWISH 1-2 MINUTES  THEN SWALLOW QID  Review of Systems     Objective:   Physical Exam  Vitals reviewed. Constitutional: She is oriented to person, place, and time. She appears well-nourished. She appears distressed (MILD DISTRESS DUE TO L HIP PAIN).  HENT:  Head: Normocephalic and atraumatic.  Mouth/Throat: Oropharynx is clear and moist. No oropharyngeal exudate.  Eyes: Pupils are equal, round, and reactive to light. No scleral icterus.  Neck: Normal range of motion. Neck supple.  Cardiovascular: Normal rate, regular rhythm and normal heart sounds.   Pulmonary/Chest: Effort normal and breath sounds normal. No respiratory distress.  Abdominal: Soft. Bowel sounds are normal. She exhibits no distension. There is no tenderness.  Musculoskeletal: She exhibits no edema.  Neurological: She is alert and oriented to person, place, and time.       NO  NEW FOCAL DEFICITS           Assessment & Plan:

## 2011-12-11 NOTE — Assessment & Plan Note (Signed)
DYSPHAGIA PERSISTS. EGD/DIL DELAYED DUE TO OTHER MEDICAL PROBLEMS.  EGD/DIL AUG 20 WITH PROPOFOL DUE TO POLYPHARMACY. OPV IN 4 MOS.

## 2011-12-13 ENCOUNTER — Encounter (HOSPITAL_COMMUNITY): Payer: Self-pay | Admitting: Pharmacy Technician

## 2011-12-16 NOTE — Progress Notes (Signed)
Reminder in epic to follow up with SF in E30 in 4 months

## 2011-12-18 ENCOUNTER — Encounter (HOSPITAL_COMMUNITY): Admission: RE | Admit: 2011-12-18 | Payer: Medicare Other | Source: Ambulatory Visit

## 2011-12-20 ENCOUNTER — Encounter (HOSPITAL_COMMUNITY): Payer: Self-pay

## 2011-12-20 ENCOUNTER — Encounter (HOSPITAL_COMMUNITY)
Admission: RE | Admit: 2011-12-20 | Discharge: 2011-12-20 | Disposition: A | Discharge status: Home / Self Care | Payer: Medicare Other | Source: Ambulatory Visit | Attending: Gastroenterology | Admitting: Gastroenterology

## 2011-12-20 ENCOUNTER — Other Ambulatory Visit: Payer: Self-pay

## 2011-12-20 DIAGNOSIS — K2940 Chronic atrophic gastritis without bleeding: Secondary | ICD-10-CM | POA: Diagnosis not present

## 2011-12-20 DIAGNOSIS — K2980 Duodenitis without bleeding: Secondary | ICD-10-CM | POA: Diagnosis not present

## 2011-12-20 DIAGNOSIS — Z01812 Encounter for preprocedural laboratory examination: Secondary | ICD-10-CM | POA: Diagnosis not present

## 2011-12-20 DIAGNOSIS — K222 Esophageal obstruction: Secondary | ICD-10-CM | POA: Diagnosis not present

## 2011-12-20 HISTORY — DX: Essential (primary) hypertension: I10

## 2011-12-20 HISTORY — DX: Gastro-esophageal reflux disease without esophagitis: K21.9

## 2011-12-20 HISTORY — DX: Sleep apnea, unspecified: G47.30

## 2011-12-20 HISTORY — DX: Shortness of breath: R06.02

## 2011-12-20 LAB — BASIC METABOLIC PANEL
BUN: 6 mg/dL (ref 6–23)
Chloride: 98 mEq/L (ref 96–112)
Creatinine, Ser: 0.46 mg/dL — ABNORMAL LOW (ref 0.50–1.10)
GFR calc Af Amer: >90 mL/min (ref >90)
Glucose, Bld: 89 mg/dL (ref 70–99)

## 2011-12-20 NOTE — Patient Instructions (Addendum)
[place]  12/20/2011   Your procedure is scheduled on:  12/24/11  Report to [facility] at 07:25 AM.  Call this number if you have problems the morning of surgery: [phone]   Remember:   Do not eat or drink:After Midnight.  Take these medicines the morning of surgery with A SIP OF WATER: Amlodipine, Protonix, Quinapril-HCTZ. Also, take your Symbicort and Albuterol inhalers.    Do not wear jewelry, make-up or nail polish.  Do not wear lotions, powders, or perfumes. You may wear deodorant.  Do not shave 48 hours prior to surgery. Men may shave face and neck.  Do not bring valuables to the hospital.  Contacts, dentures or bridgework may not be worn into surgery.  Leave suitcase in the car. After surgery it may be brought to your room.  For patients admitted to the hospital, checkout time is 11:00 AM the day of discharge.   Patients discharged the day of surgery will not be allowed to drive home.   Please read over the following fact sheets that you were given: Pain Booklet, Anesthesia Post-op Instructions and Care and Recovery After Surgery    Esophagogastroduodenoscopy This is an endoscopic procedure (a procedure that uses a device like a flexible telescope) that allows your caregiver to view the upper stomach and small bowel. This test allows your caregiver to look at the esophagus. The esophagus carries food from your mouth to your stomach. They can also look at your duodenum. This is the first part of the small intestine that attaches to the stomach. This test is used to detect problems in the bowel such as ulcers and inflammation. PREPARATION FOR TEST Nothing to eat after midnight the day before the test. NORMAL FINDINGS Normal esophagus, stomach, and duodenum. Ranges for normal findings may vary among different laboratories and hospitals. You should always check with your doctor after having lab work or other tests done to discuss the meaning of your test results and whether  your values are considered within normal limits. MEANING OF TEST  Your caregiver will go over the test results with you and discuss the importance and meaning of your results, as well as treatment options and the need for additional tests if necessary. OBTAINING THE TEST RESULTS It is your responsibility to obtain your test results. Ask the lab or department performing the test when and how you will get your results. Document Released: 08/23/2004 Document Revised: 04/11/2011 Document Reviewed: 04/01/2008 [facility] Patient Information 2012 [place].   Esophageal Dilatation The esophagus is the long, narrow tube which carries food and liquid from the mouth to the stomach. Esophageal dilatation is the technique used to stretch a blocked or narrowed portion of the esophagus. This procedure is used when a part of the esophagus has become so narrow that it becomes difficult, painful or even impossible to swallow. This is generally an uncomplicated form of treatment. When this is not successful, chest surgery may be required. This is a much more extensive form of treatment with a longer recovery time. CAUSES  Some of the more common causes of blockage or strictures of the esophagus are:  Narrowing from longstanding inflammation (soreness and redness) of the lower esophagus. This comes from the constant exposure of the lower esophagus to the acid which bubbles up from the stomach. Over time this causes scarring and narrowing of the lower esophagus.   Hiatal hernia in which a small part of the stomach bulges (herniates) up through the diaphragm. This can  cause a gradual narrowing of the end of the esophagus.   Schatzki's Ring is a narrow ring of benign (non-cancerous) fibrous tissue which constricts the lower esophagus. The reason for this is not known.   Scleroderma is a connective tissue disorder that affects the esophagus and makes swallowing difficult.   Achalasia is an absence of nerves to  the lower esophagus and to the esophageal sphincter. This is the circular muscle between the stomach and esophagus that relaxes to allow food into the stomach. After swallowing, it contracts to keep food in the stomach. This absence of nerves may be congenital (present since birth). This can cause irregular spasms of the lower esophageal muscle. This spasm does not open up to allow food and fluid through. The result is a persistent blockage with subsequent slow trickling of the esophageal contents into the stomach.   Strictures may develop from swallowing materials which damage the esophagus. Some examples are strong acids or alkalis such as lye.   Growths such as benign (non-cancerous) and malignant (cancerous) tumors can block the esophagus.   Heredity (present since birth) causes.  DIAGNOSIS  Your caregiver often suspects this problem by taking a medical history. They will also do a physical exam. They can then prove their suspicions using X-rays and endoscopy. Endoscopy is an exam in which a tube like a small flexible telescope is used to look at your esophagus.  TREATMENT There are different stretching (dilating) techniques which can be used. Simple bougie dilatation may be done in the office. This usually takes only a couple minutes. A numbing (anesthetic) spray of the throat is used. Endoscopy, when done, is done in an endoscopy suite, under mild sedation. When fluoroscopy is used, the procedure is performed in X-ray. Other techniques require a little longer time. Recovery is usually quick. There is no waiting time to begin eating and drinking to test success of the treatment. Following are some of the methods used. Narrowing of the esophagus is treated by making it bigger. Commonly this is a mechanical problem which can be treated with stretching. This can be done in different ways. Your caregiver will discuss these with you. Some of the means used are:  A series of graduated (increasing  thickness) flexible dilators can be used. These are weighted tubes passed through the esophagus into the stomach. The tubes used become progressively larger until the desired stretched size is reached. Graduated dilators are a simple and quick way of opening the esophagus. No visualization is required.   Another method is the use of endoscopy to place a flexible wire across the stricture. The endoscope is removed and the wire left in place. A dilator with a hole through it from end to end is guided down the esophagus and across the stricture. One or more of these dilators are passed over the wire. At the end of the exam, the wire is removed. This type of treatment may be performed in the X-ray department under fluoroscopy. An advantage of this procedure is the examiner is visualizing the end opening in the esophagus.   Stretching of the esophagus may be done using balloons. Deflated balloons are placed through the endoscope and across the stricture. This type of balloon dilatation is often done at the time of endoscopy or fluoroscopy. Flexible endoscopy allows the examiner to directly view the stricture. A balloon is inserted in the deflated form into the area of narrowing. It is then inflated with air to a certain pressure that is pre-set for  a given circumference. When inflated, it becomes sausage shaped, stretched, and makes the stricture larger.   Achalasia requires a longer larger balloon-type dilator. This is frequently done under X-ray control. In this situation, the spastic muscle fibers in the lower esophagus are stretched.  All of the above procedures make the passage of food and water into the stomach easier. They also make it easier for stomach contents to reflux back into the esophagus. Special medications may be used following the procedure to help prevent further stricturing. Proton-pump inhibitor medications are good at decreasing the amount of acid in the stomach juice. When stomach juice  refluxes into the esophagus, the juice is no longer as acidic and is less likely to burn or scar the esophagus. RISKS AND COMPLICATIONS Esophageal dilatation is usually performed effectively and without problems. Some complications that can occur are:  A small amount of bleeding almost always happens where the stretching takes place. If this is too excessive it may require more aggressive treatment.   An uncommon complication is perforation (making a hole) of the esophagus. The esophagus is thin. It is easy to make a hole in it. If this happens, an operation may be necessary to repair this.   A small, undetected perforation could lead to an infection in the chest. This can be very serious.  HOME CARE INSTRUCTIONS   If you received sedation for your procedure, do not drive, make important decisions, or perform any activities requiring your full coordination. Do not drink alcohol, take sedatives, or use any mind altering chemicals unless instructed by your caregiver.   You may use throat lozenges or warm salt water gargles if you have throat discomfort   You can begin eating and drinking normally on return home unless instructed otherwise. Do not purposely try to force large chunks of food down to test the benefits of your procedure.   Mild discomfort can be eased with sips of ice water.   Medications for discomfort may or may not be needed.  SEEK IMMEDIATE MEDICAL CARE IF:   You begin vomiting up blood.   You develop black tarry stools   You develop chills or an unexplained temperature of over 101 F (38.3 C)   You develop chest or abdominal pain.   You develop shortness of breath or feel lightheaded or faint.   Your swallowing is becoming more painful, difficult, or you are unable to swallow.  MAKE SURE YOU:   Understand these instructions.   Will watch your condition.   Will get help right away if you are not doing well or get worse.  Document Released: 06/13/2005 Document  Revised: 04/11/2011 Document Reviewed: 07/31/2005 [facility] Patient Information 2012 [place].   PATIENT INSTRUCTIONS POST-ANESTHESIA  IMMEDIATELY FOLLOWING SURGERY:  Do not drive or operate machinery for the first twenty four hours after surgery.  Do not make any important decisions for twenty four hours after surgery or while taking narcotic pain medications or sedatives.  If you develop intractable nausea and vomiting or a severe headache please notify your doctor immediately.  FOLLOW-UP:  Please make an appointment with your surgeon as instructed. You do not need to follow up with anesthesia unless specifically instructed to do so.  WOUND CARE INSTRUCTIONS (if applicable):  Keep a dry clean dressing on the anesthesia/puncture wound site if there is drainage.  Once the wound has quit draining you may leave it open to air.  Generally you should leave the bandage intact for twenty four hours unless there  is drainage.  If the epidural site drains for more than 36-48 hours please call the anesthesia department.  QUESTIONS?:  Please feel free to call your physician or the hospital operator if you have any questions, and they will be happy to assist you.

## 2011-12-20 NOTE — Progress Notes (Signed)
12/20/11 1125  OBSTRUCTIVE SLEEP APNEA  Have you ever been diagnosed with sleep apnea through a sleep study? No  Do you snore loudly (loud enough to be heard through closed doors)?  0  Do you often feel tired, fatigued, or sleepy during the daytime? 1  Has anyone observed you stop breathing during your sleep? 1  Do you have, or are you being treated for high blood pressure? 1  BMI more than 35 kg/m2? 0  Age over 54 years old? 1  Neck circumference greater than 40 cm/18 inches? 0  Gender: 0  Obstructive Sleep Apnea Score 4   Score 4 or greater  Updated health history;Results sent to PCP

## 2011-12-24 ENCOUNTER — Encounter (HOSPITAL_COMMUNITY)
Admission: EM | Disposition: A | Discharge status: Home / Self Care | Payer: Self-pay | Source: Home / Self Care | Attending: Gastroenterology

## 2011-12-24 ENCOUNTER — Ambulatory Visit (HOSPITAL_COMMUNITY): Payer: Medicare Other | Admitting: Anesthesiology

## 2011-12-24 ENCOUNTER — Encounter (HOSPITAL_COMMUNITY): Payer: Self-pay | Admitting: Anesthesiology

## 2011-12-24 ENCOUNTER — Ambulatory Visit (HOSPITAL_COMMUNITY)
Admission: EM | Admit: 2011-12-24 | Discharge: 2011-12-24 | Disposition: A | Discharge status: Home / Self Care | Payer: Medicare Other | Source: Home / Self Care | Attending: Gastroenterology | Admitting: Gastroenterology

## 2011-12-24 ENCOUNTER — Encounter (HOSPITAL_COMMUNITY): Payer: Self-pay | Admitting: *Deleted

## 2011-12-24 DIAGNOSIS — Z87891 Personal history of nicotine dependence: Secondary | ICD-10-CM

## 2011-12-24 DIAGNOSIS — K2970 Gastritis, unspecified, without bleeding: Secondary | ICD-10-CM | POA: Diagnosis present

## 2011-12-24 DIAGNOSIS — J449 Chronic obstructive pulmonary disease, unspecified: Secondary | ICD-10-CM | POA: Diagnosis present

## 2011-12-24 DIAGNOSIS — K222 Esophageal obstruction: Secondary | ICD-10-CM | POA: Insufficient documentation

## 2011-12-24 DIAGNOSIS — R1310 Dysphagia, unspecified: Secondary | ICD-10-CM | POA: Insufficient documentation

## 2011-12-24 DIAGNOSIS — M51379 Other intervertebral disc degeneration, lumbosacral region without mention of lumbar back pain or lower extremity pain: Secondary | ICD-10-CM | POA: Diagnosis present

## 2011-12-24 DIAGNOSIS — E871 Hypo-osmolality and hyponatremia: Principal | ICD-10-CM | POA: Diagnosis present

## 2011-12-24 DIAGNOSIS — K2980 Duodenitis without bleeding: Secondary | ICD-10-CM | POA: Insufficient documentation

## 2011-12-24 DIAGNOSIS — R7401 Elevation of levels of liver transaminase levels: Secondary | ICD-10-CM | POA: Diagnosis present

## 2011-12-24 DIAGNOSIS — F411 Generalized anxiety disorder: Secondary | ICD-10-CM | POA: Diagnosis present

## 2011-12-24 DIAGNOSIS — G8929 Other chronic pain: Secondary | ICD-10-CM | POA: Diagnosis present

## 2011-12-24 DIAGNOSIS — Z6826 Body mass index (BMI) 26.0-26.9, adult: Secondary | ICD-10-CM

## 2011-12-24 DIAGNOSIS — Z01812 Encounter for preprocedural laboratory examination: Secondary | ICD-10-CM | POA: Insufficient documentation

## 2011-12-24 DIAGNOSIS — Z9049 Acquired absence of other specified parts of digestive tract: Secondary | ICD-10-CM

## 2011-12-24 DIAGNOSIS — E785 Hyperlipidemia, unspecified: Secondary | ICD-10-CM | POA: Insufficient documentation

## 2011-12-24 DIAGNOSIS — K2940 Chronic atrophic gastritis without bleeding: Secondary | ICD-10-CM | POA: Insufficient documentation

## 2011-12-24 DIAGNOSIS — Z79899 Other long term (current) drug therapy: Secondary | ICD-10-CM

## 2011-12-24 DIAGNOSIS — M5137 Other intervertebral disc degeneration, lumbosacral region: Secondary | ICD-10-CM | POA: Diagnosis present

## 2011-12-24 DIAGNOSIS — E876 Hypokalemia: Secondary | ICD-10-CM | POA: Diagnosis present

## 2011-12-24 DIAGNOSIS — K209 Esophagitis, unspecified without bleeding: Secondary | ICD-10-CM | POA: Diagnosis present

## 2011-12-24 DIAGNOSIS — G4730 Sleep apnea, unspecified: Secondary | ICD-10-CM | POA: Diagnosis present

## 2011-12-24 DIAGNOSIS — K2990 Gastroduodenitis, unspecified, without bleeding: Secondary | ICD-10-CM | POA: Diagnosis not present

## 2011-12-24 DIAGNOSIS — Z01810 Encounter for preprocedural cardiovascular examination: Secondary | ICD-10-CM | POA: Insufficient documentation

## 2011-12-24 DIAGNOSIS — R7402 Elevation of levels of lactic acid dehydrogenase (LDH): Secondary | ICD-10-CM | POA: Diagnosis present

## 2011-12-24 DIAGNOSIS — E669 Obesity, unspecified: Secondary | ICD-10-CM | POA: Diagnosis present

## 2011-12-24 DIAGNOSIS — J4489 Other specified chronic obstructive pulmonary disease: Secondary | ICD-10-CM | POA: Insufficient documentation

## 2011-12-24 DIAGNOSIS — K5090 Crohn's disease, unspecified, without complications: Secondary | ICD-10-CM | POA: Diagnosis present

## 2011-12-24 DIAGNOSIS — I10 Essential (primary) hypertension: Secondary | ICD-10-CM | POA: Diagnosis present

## 2011-12-24 HISTORY — PX: ESOPHAGEAL DILATION: SHX303

## 2011-12-24 HISTORY — PX: BIOPSY: SHX5522

## 2011-12-24 LAB — HEPATIC FUNCTION PANEL
AST: 76 U/L — ABNORMAL HIGH (ref 0–37)
Bilirubin, Direct: 0.3 mg/dL (ref 0.0–0.3)
Indirect Bilirubin: 0.5 mg/dL (ref 0.3–0.9)
Total Bilirubin: 0.8 mg/dL (ref 0.3–1.2)

## 2011-12-24 SURGERY — ESOPHAGOGASTRODUODENOSCOPY (EGD) WITH PROPOFOL
Anesthesia: Monitor Anesthesia Care

## 2011-12-24 MED ORDER — GLYCOPYRROLATE 0.2 MG/ML IJ SOLN
0.2000 mg | Freq: Once | INTRAMUSCULAR | Status: AC
  Administered 2011-12-24: 0.2 mg via INTRAVENOUS

## 2011-12-24 MED ORDER — FENTANYL CITRATE 0.05 MG/ML IJ SOLN
INTRAMUSCULAR | Status: DC | PRN
  Administered 2011-12-24 (×2): 50 ug via INTRAVENOUS

## 2011-12-24 MED ORDER — ONDANSETRON HCL 4 MG/2ML IJ SOLN
4.0000 mg | Freq: Once | INTRAMUSCULAR | Status: AC
  Administered 2011-12-24: 4 mg via INTRAVENOUS

## 2011-12-24 MED ORDER — ONDANSETRON HCL 4 MG/2ML IJ SOLN: 4.0000 mg | Freq: Once | INTRAMUSCULAR | Status: DC | PRN

## 2011-12-24 MED ORDER — MIDAZOLAM HCL 2 MG/2ML IJ SOLN
1.0000 mg | INTRAMUSCULAR | Status: DC | PRN
  Administered 2011-12-24 (×2): 2 mg via INTRAVENOUS

## 2011-12-24 MED ORDER — MINERAL OIL PO OIL
TOPICAL_OIL | ORAL | Status: AC
  Filled 2011-12-24: qty 30

## 2011-12-24 MED ORDER — BUTAMBEN-TETRACAINE-BENZOCAINE 2-2-14 % EX AERO
1.0000 | INHALATION_SPRAY | Freq: Once | CUTANEOUS | Status: AC
  Administered 2011-12-24: 1 via TOPICAL
  Filled 2011-12-24: qty 56

## 2011-12-24 MED ORDER — MIDAZOLAM HCL 5 MG/5ML IJ SOLN
INTRAMUSCULAR | Status: DC | PRN
  Administered 2011-12-24: 2 mg via INTRAVENOUS

## 2011-12-24 MED ORDER — PROPOFOL 10 MG/ML IV EMUL
INTRAVENOUS | Status: DC | PRN
  Administered 2011-12-24: 100 ug/kg/min via INTRAVENOUS

## 2011-12-24 MED ORDER — PANTOPRAZOLE SODIUM 40 MG PO TBEC: DELAYED_RELEASE_TABLET | ORAL | Status: DC

## 2011-12-24 MED ORDER — PROPOFOL 10 MG/ML IV EMUL
INTRAVENOUS | Status: AC
  Filled 2011-12-24: qty 20

## 2011-12-24 MED ORDER — LACTATED RINGERS IV SOLN
INTRAVENOUS | Status: DC
  Administered 2011-12-24: 1000 mL via INTRAVENOUS

## 2011-12-24 MED ORDER — MIDAZOLAM HCL 2 MG/2ML IJ SOLN
INTRAMUSCULAR | Status: AC
  Filled 2011-12-24: qty 2

## 2011-12-24 MED ORDER — PROPOFOL 10 MG/ML IV BOLUS
INTRAVENOUS | Status: DC | PRN
  Administered 2011-12-24: 20 mg via INTRAVENOUS
  Administered 2011-12-24: 30 mg via INTRAVENOUS
  Administered 2011-12-24 (×3): 20 mg via INTRAVENOUS

## 2011-12-24 MED ORDER — FENTANYL CITRATE 0.05 MG/ML IJ SOLN
INTRAMUSCULAR | Status: AC
  Filled 2011-12-24: qty 2

## 2011-12-24 MED ORDER — ONDANSETRON HCL 4 MG/2ML IJ SOLN
INTRAMUSCULAR | Status: AC
  Filled 2011-12-24: qty 2

## 2011-12-24 MED ORDER — FENTANYL CITRATE 0.05 MG/ML IJ SOLN: 25.0000 ug | INTRAMUSCULAR | Status: DC | PRN

## 2011-12-24 MED ORDER — GLYCOPYRROLATE 0.2 MG/ML IJ SOLN
INTRAMUSCULAR | Status: AC
  Filled 2011-12-24: qty 1

## 2011-12-24 MED ORDER — STERILE WATER FOR IRRIGATION IR SOLN
Status: DC | PRN
  Administered 2011-12-24: 10:00:00

## 2011-12-24 MED ORDER — STERILE WATER FOR IRRIGATION IR SOLN
Status: DC | PRN
  Administered 2011-12-24: 1000 mL

## 2011-12-24 SURGICAL SUPPLY — 25 items
BALLN CRE LF 10-12 240X5.5 (BALLOONS)
BALLN DILATOR CRE 12-15 240 (BALLOONS)
BALLN DILATOR CRE 15-18 240 (BALLOONS) IMPLANT
BALLN DILATOR CRE 18-20 240 (BALLOONS) IMPLANT
BALLN DILATOR CRE WIREGUIDE (BALLOONS)
BALLOON CRE LF 10-12 240X5.5 (BALLOONS) IMPLANT
BALLOON DILATOR CRE 12-15 240 (BALLOONS) IMPLANT
BALLOON DILATOR CRE WIREGUIDE (BALLOONS) IMPLANT
BLOCK BITE 60FR ADLT L/F BLUE (MISCELLANEOUS) ×3 IMPLANT
ELECT REM PT RETURN 9FT ADLT (ELECTROSURGICAL)
ELECTRODE REM PT RTRN 9FT ADLT (ELECTROSURGICAL) IMPLANT
FLOOR PAD 36X40 (MISCELLANEOUS) ×3
FORCEPS BIOP RAD 4 LRG CAP 4 (CUTTING FORCEPS) ×3 IMPLANT
MANIFOLD NEPTUNE II (INSTRUMENTS) ×3 IMPLANT
NEEDLE SCLEROTHERAPY 25GX240 (NEEDLE) IMPLANT
PAD FLOOR 36X40 (MISCELLANEOUS) ×2 IMPLANT
PROBE APC STR FIRE (PROBE) IMPLANT
PROBE INJECTION GOLD (MISCELLANEOUS)
PROBE INJECTION GOLD 7FR (MISCELLANEOUS) IMPLANT
SNARE SHORT THROW 13M SML OVAL (MISCELLANEOUS) IMPLANT
SYR 50ML LL SCALE MARK (SYRINGE) ×3 IMPLANT
SYR INFLATE BILIARY GAUGE (MISCELLANEOUS) IMPLANT
TUBING ENDO SMARTCAP PENTAX (MISCELLANEOUS) ×3 IMPLANT
TUBING IRRIGATION ENDOGATOR (MISCELLANEOUS) ×3 IMPLANT
WATER STERILE IRR 1000ML POUR (IV SOLUTION) ×6 IMPLANT

## 2011-12-24 NOTE — Anesthesia Preprocedure Evaluation (Signed)
Anesthesia Evaluation  Patient identified by MRN, date of birth, ID band Patient awake    Reviewed: Allergy & Precautions, H&P , NPO status , Patient's Chart, lab work & pertinent test results  Airway Mallampati: II    Mouth opening: Limited Mouth Opening  Dental  (+) Poor Dentition   Pulmonary shortness of breath, asthma , sleep apnea , COPD COPD inhaler, former smoker,  breath sounds clear to auscultation        Cardiovascular hypertension, Pt. on medications Rhythm:Regular Rate:Normal     Neuro/Psych  Headaches, PSYCHIATRIC DISORDERS Anxiety Depression    GI/Hepatic PUD, GERD- (esoph stricture)  ,  Endo/Other    Renal/GU      Musculoskeletal   Abdominal   Peds  Hematology   Anesthesia Other Findings   Reproductive/Obstetrics                           Anesthesia Physical Anesthesia Plan  ASA: III  Anesthesia Plan: MAC   Post-op Pain Management:    Induction: Intravenous  Airway Management Planned: Simple Face Mask  Additional Equipment:   Intra-op Plan:   Post-operative Plan:   Informed Consent: I have reviewed the patients History and Physical, chart, labs and discussed the procedure including the risks, benefits and alternatives for the proposed anesthesia with the patient or authorized representative who has indicated his/her understanding and acceptance.     Plan Discussed with:   Anesthesia Plan Comments:         Anesthesia Quick Evaluation

## 2011-12-24 NOTE — Op Note (Signed)
[facility] [address] [place], [address]   ENDOSCOPY PROCEDURE REPORT  PATIENT: [name] [name], [name] [name]  MR#: [id number] BIRTHDATE: 1956/04/19 , 56  yrs. old GENDER: Female ENDOSCOPIST: Barney Drain, MD REFFERED [id number] Moshe Cipro, M.D. PROCEDURE DATE:  12/24/2011 PROCEDURE:   EGD with biopsy and EGD with dilatation over guidewire  ASA CLASS: INDICATIONS:1.  dysphagia. MEDICATIONS: MAC sedation, administered by CRNA TOPICAL ANESTHETIC: Cetacaine Spray  DESCRIPTION OF PROCEDURE:   After the risks benefits and alternatives of the procedure were thoroughly explained, informed consent was obtained.  The     endoscope was introduced through the mouth and advanced to the second portion of the duodenum.  The instrument was slowly withdrawn as the mucosa was carefully examined.  Prior to withdrawal of the scope, the guidwire was placed.  The esophagus was dilated successfully.  The patient was recovered in endoscopy and discharged home in satisfactory condition.    A stricture was found in the distal esophagus NARROWING THE LUMEN TO 12-13 MM.   Moderate gastritis was found & BIOPSIED VIA COLD FORCEPS. MILD Duodenitis was found in the bulb of the duodenum. Dilation was then performed at the gastroesphageal junction  Dilator: Savary over guidewire Size(s): 12.8-16 MM Resistance: minimal Heme: yes  COMPLICATIONS: There were no complications.   ENDOSCOPIC IMPRESSION: 1.   A stricture was found in the distal esophagus 2.   Moderate gastritis 3.   MILD Duodenitis  RECOMMENDATIONS: 1.  Await biopsy results 2.  Continue PPI daily - 30 mins before a meal 3.  LOW FAT DIET 4.  OPV DEC 2013      _______________________________ eSignedBarney Drain, MD 12/24/2011 10:12 AM

## 2011-12-24 NOTE — Interval H&P Note (Signed)
History and Physical Interval Note:  12/24/2011 8:00 AM  [name] [name]  has presented today for surgery, with the diagnosis of Dysphagia  The various methods of treatment have been discussed with the patient and family. After consideration of risks, benefits and other options for treatment, the patient has consented to  Procedure(s) (LRB): ESOPHAGOGASTRODUODENOSCOPY (EGD) WITH PROPOFOL (N/A) ESOPHAGEAL DILATION (N/A) as a surgical intervention .  The patient's history has been reviewed, patient examined, no change in status, stable for surgery.  I have reviewed the patient's chart and labs.  Questions were answered to the patient's satisfaction.     [employer]

## 2011-12-24 NOTE — Transfer of Care (Signed)
Immediate Anesthesia Transfer of Care Note  Patient: [name] [name]  Procedure(s) Performed: Procedure(s) (LRB): ESOPHAGOGASTRODUODENOSCOPY (EGD) WITH PROPOFOL (N/A) ESOPHAGEAL DILATION (N/A) BIOPSY ()  Patient Location: PACU  Anesthesia Type: MAC  Level of Consciousness: awake, alert  and oriented  Airway & Oxygen Therapy: Patient Spontanous Breathing and Patient connected to face mask oxygen  Post-op Assessment: Report given to PACU RN  Post vital signs: Reviewed and stable  Complications: No apparent anesthesia complications

## 2011-12-24 NOTE — Telephone Encounter (Signed)
Open in error

## 2011-12-24 NOTE — H&P (View-Only) (Signed)
Subjective:    Patient ID: [name] [name], female    DOB: 10-27-1955, 56 y.o.   MRN: [id number]  PCP: [facility]  HPI NO NEED TO REFILL VICODIN 5/500 OR PROTONIX. SWALLOWING PROBLEMS:  EVERY DAY (SOLID ONLY). ALSO PILLS. READY TO BE STRETCHED. SQUIRTS AFTER RAW SPINACH AND RAW BROCCOLI. BMs: 2-3 TIMES A DAY. MAY HAVE A GOOD BM THEN DIARRHEA. QUIT SMOKING. SIGNIFICANT OTHER FOR 13 YEARS NOW STILL SMOKES. NO JAUNDICE OR ITCHING. LAST HFP NOV 2012 ELEVATED AST/ALT.  Past Medical History  Diagnosis Date  . Elevated liver enzymes 2009: BMI 24 ?Etoh ALT 94, AST 51,  NEG ANA, qIGs& ASMA    MAY 2012 AST 52 ALT 38  . Anastomotic ulcer JAN 2009  . Esophageal stricture 2009  . Diarrhea MULITIFACTORIAL    IBS, LACTOSE INTOLERANCE, SBBO, BILE-SALT  . Inflammatory bowel disease 2006 CD    SURGICAL REMISSION  . LBP (low back pain)   . Migraine   . Asthma   . Allergic rhinitis   . CTS (carpal tunnel syndrome)   . Hyperlipemia   . Anxiety   . Hemorrhoid   . Crohn disease   . BMI (body mass index) 20.0-29.9 2009 121 lbs  . 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 Hemicolectomy RIGHT 2006  . Hernia repair   . Carpal tunnel release LEFT  . Lumbar disc surgery   . Vocal cord surgery Sep 20, 2010    precancerous areas removed  . Esophagogastroduodenoscopy 02/07/09    mild gastritis  . Colonoscopy JAN 2009 DIARRHEA, ABD PAIN, BRBPR    ANASTOMOTIC ULCER(3MM), IH HR:CBULAG ULCER, NL COLON bX  . Upper gastrointestinal endoscopy JAN 2009 ABD PAIN    GASTRITIS, ESO RING  . Upper gastrointestinal endoscopy OCT 2010 ABD PAIN, DYSPHAGIA    DIL 15MM, GASTRITIS, NL DUODENUM  . Upper gastrointestinal endoscopy OCT 2010 DYSPHAGIA    DIL 17 MM, GASTRITIS, NL DUODENUM    Allergies  Allergen Reactions  . Lovastatin     Chest pain    Current Outpatient Prescriptions  Medication Sig Dispense Refill  . albuterol (PROAIR HFA) 108 (90 BASE) MCG/ACT inhaler Inhale 2  puffs into the lungs every 6 (six) hours as needed.        Marland Kitchen amLODipine (NORVASC) 5 MG tablet Take 1 tablet (5 mg total) by mouth daily.    . budesonide-formoterol (SYMBICORT) 160-4.5 MCG/ACT inhaler Inhale 2 puffs into the lungs 2 (two) times daily.    . calcium carbonate (TUMS - DOSED IN MG ELEMENTAL CALCIUM) 500 MG chewable tablet Chew 1 tablet by mouth daily.      . diazepam (VALIUM) 5 MG tablet Take 0.5 tablets (2.5 mg total) by mouth 2 (two) times daily.    . fluticasone (FLONASE) 50 MCG/ACT nasal spray Place 1 spray into the nose 2 (two) times daily.    Marland Kitchen gabapentin (NEURONTIN) 300 MG capsule Take 300 mg by mouth 3 (three) times daily.     Marland Kitchen HYDROcodone-acetaminophen (NORCO) 10-325 MG per tablet Take 1 tablet by mouth every 4 (four) hours as needed.     . pantoprazole (PROTONIX) 40 MG tablet Take 40 mg by mouth daily.    .      . quinapril-hydrochlorothiazide (ACCURETIC) 10-12.5 MG per tablet TAKE ONE TABLET BY MOUTH EVERY DAY    . DUKES MOUTH WASH 1 TSP PO SWISH 1-2 MINUTES  THEN SWALLOW QID  Review of Systems     Objective:   Physical Exam  Vitals reviewed. Constitutional: She is oriented to person, place, and time. She appears well-nourished. She appears distressed (MILD DISTRESS DUE TO L HIP PAIN).  HENT:  Head: Normocephalic and atraumatic.  Mouth/Throat: Oropharynx is clear and moist. No oropharyngeal exudate.  Eyes: Pupils are equal, round, and reactive to light. No scleral icterus.  Neck: Normal range of motion. Neck supple.  Cardiovascular: Normal rate, regular rhythm and normal heart sounds.   Pulmonary/Chest: Effort normal and breath sounds normal. No respiratory distress.  Abdominal: Soft. Bowel sounds are normal. She exhibits no distension. There is no tenderness.  Musculoskeletal: She exhibits no edema.  Neurological: She is alert and oriented to person, place, and time.       NO  NEW FOCAL DEFICITS           Assessment & Plan:

## 2011-12-24 NOTE — Anesthesia Postprocedure Evaluation (Signed)
  Anesthesia Post-op Note  Patient: [name] [name]  Procedure(s) Performed: Procedure(s) (LRB): ESOPHAGOGASTRODUODENOSCOPY (EGD) WITH PROPOFOL (N/A) ESOPHAGEAL DILATION (N/A) BIOPSY ()  Patient Location: PACU  Anesthesia Type: MAC  Level of Consciousness: awake, alert  and oriented  Airway and Oxygen Therapy: Patient Spontanous Breathing and Patient connected to face mask oxygen  Post-op Pain: none  Post-op Assessment: Post-op Vital signs reviewed, Patient's Cardiovascular Status Stable, Respiratory Function Stable, Patent Airway and No signs of Nausea or vomiting  Post-op Vital Signs: Reviewed and stable  Complications: No apparent anesthesia complications

## 2011-12-26 ENCOUNTER — Encounter (HOSPITAL_COMMUNITY): Payer: Self-pay | Admitting: Gastroenterology

## 2011-12-27 ENCOUNTER — Encounter (HOSPITAL_COMMUNITY): Payer: Self-pay | Admitting: Emergency Medicine

## 2011-12-27 ENCOUNTER — Inpatient Hospital Stay (HOSPITAL_COMMUNITY): Payer: Medicare Other

## 2011-12-27 ENCOUNTER — Emergency Department (HOSPITAL_COMMUNITY): Payer: Medicare Other

## 2011-12-27 ENCOUNTER — Inpatient Hospital Stay (HOSPITAL_COMMUNITY)
Admission: EM | Admit: 2011-12-27 | Discharge: 2011-12-29 | DRG: 641 | Disposition: A | Discharge status: Home / Self Care | Payer: Medicare Other | Attending: Gastroenterology | Admitting: Gastroenterology

## 2011-12-27 DIAGNOSIS — E669 Obesity, unspecified: Secondary | ICD-10-CM | POA: Diagnosis present

## 2011-12-27 DIAGNOSIS — M5137 Other intervertebral disc degeneration, lumbosacral region: Secondary | ICD-10-CM | POA: Diagnosis not present

## 2011-12-27 DIAGNOSIS — I10 Essential (primary) hypertension: Secondary | ICD-10-CM

## 2011-12-27 DIAGNOSIS — R112 Nausea with vomiting, unspecified: Secondary | ICD-10-CM

## 2011-12-27 DIAGNOSIS — K219 Gastro-esophageal reflux disease without esophagitis: Secondary | ICD-10-CM

## 2011-12-27 DIAGNOSIS — K2990 Gastroduodenitis, unspecified, without bleeding: Secondary | ICD-10-CM

## 2011-12-27 DIAGNOSIS — E871 Hypo-osmolality and hyponatremia: Principal | ICD-10-CM

## 2011-12-27 DIAGNOSIS — M810 Age-related osteoporosis without current pathological fracture: Secondary | ICD-10-CM

## 2011-12-27 DIAGNOSIS — J449 Chronic obstructive pulmonary disease, unspecified: Secondary | ICD-10-CM

## 2011-12-27 DIAGNOSIS — K2970 Gastritis, unspecified, without bleeding: Secondary | ICD-10-CM | POA: Diagnosis present

## 2011-12-27 DIAGNOSIS — R7402 Elevation of levels of lactic acid dehydrogenase (LDH): Secondary | ICD-10-CM

## 2011-12-27 DIAGNOSIS — K2289 Other specified disease of esophagus: Secondary | ICD-10-CM | POA: Diagnosis not present

## 2011-12-27 DIAGNOSIS — R1310 Dysphagia, unspecified: Secondary | ICD-10-CM

## 2011-12-27 DIAGNOSIS — R197 Diarrhea, unspecified: Secondary | ICD-10-CM

## 2011-12-27 DIAGNOSIS — K5090 Crohn's disease, unspecified, without complications: Secondary | ICD-10-CM | POA: Diagnosis present

## 2011-12-27 DIAGNOSIS — Z720 Tobacco use: Secondary | ICD-10-CM

## 2011-12-27 DIAGNOSIS — IMO0002 Reserved for concepts with insufficient information to code with codable children: Secondary | ICD-10-CM

## 2011-12-27 DIAGNOSIS — E876 Hypokalemia: Secondary | ICD-10-CM | POA: Diagnosis present

## 2011-12-27 DIAGNOSIS — G8929 Other chronic pain: Secondary | ICD-10-CM | POA: Diagnosis not present

## 2011-12-27 DIAGNOSIS — F411 Generalized anxiety disorder: Secondary | ICD-10-CM

## 2011-12-27 DIAGNOSIS — E785 Hyperlipidemia, unspecified: Secondary | ICD-10-CM

## 2011-12-27 DIAGNOSIS — R911 Solitary pulmonary nodule: Secondary | ICD-10-CM | POA: Diagnosis not present

## 2011-12-27 DIAGNOSIS — R1110 Vomiting, unspecified: Secondary | ICD-10-CM

## 2011-12-27 DIAGNOSIS — R1115 Cyclical vomiting syndrome unrelated to migraine: Secondary | ICD-10-CM | POA: Diagnosis present

## 2011-12-27 DIAGNOSIS — Z87891 Personal history of nicotine dependence: Secondary | ICD-10-CM | POA: Diagnosis not present

## 2011-12-27 DIAGNOSIS — F329 Major depressive disorder, single episode, unspecified: Secondary | ICD-10-CM

## 2011-12-27 DIAGNOSIS — M4316 Spondylolisthesis, lumbar region: Secondary | ICD-10-CM | POA: Diagnosis present

## 2011-12-27 DIAGNOSIS — Z79899 Other long term (current) drug therapy: Secondary | ICD-10-CM | POA: Diagnosis not present

## 2011-12-27 DIAGNOSIS — A6000 Herpesviral infection of urogenital system, unspecified: Secondary | ICD-10-CM

## 2011-12-27 DIAGNOSIS — K228 Other specified diseases of esophagus: Secondary | ICD-10-CM | POA: Diagnosis not present

## 2011-12-27 DIAGNOSIS — M5030 Other cervical disc degeneration, unspecified cervical region: Secondary | ICD-10-CM

## 2011-12-27 DIAGNOSIS — Z6826 Body mass index (BMI) 26.0-26.9, adult: Secondary | ICD-10-CM | POA: Diagnosis not present

## 2011-12-27 DIAGNOSIS — D539 Nutritional anemia, unspecified: Secondary | ICD-10-CM

## 2011-12-27 DIAGNOSIS — R079 Chest pain, unspecified: Secondary | ICD-10-CM

## 2011-12-27 DIAGNOSIS — G894 Chronic pain syndrome: Secondary | ICD-10-CM | POA: Diagnosis present

## 2011-12-27 DIAGNOSIS — J4489 Other specified chronic obstructive pulmonary disease: Secondary | ICD-10-CM | POA: Diagnosis not present

## 2011-12-27 DIAGNOSIS — J309 Allergic rhinitis, unspecified: Secondary | ICD-10-CM

## 2011-12-27 DIAGNOSIS — I868 Varicose veins of other specified sites: Secondary | ICD-10-CM

## 2011-12-27 DIAGNOSIS — Z9049 Acquired absence of other specified parts of digestive tract: Secondary | ICD-10-CM | POA: Diagnosis not present

## 2011-12-27 DIAGNOSIS — G4730 Sleep apnea, unspecified: Secondary | ICD-10-CM | POA: Diagnosis present

## 2011-12-27 LAB — COMPREHENSIVE METABOLIC PANEL
ALT: 63 U/L — ABNORMAL HIGH (ref 0–35)
Albumin: 4.2 g/dL (ref 3.5–5.2)
Alkaline Phosphatase: 150 U/L — ABNORMAL HIGH (ref 39–117)
Chloride: 81 mEq/L — ABNORMAL LOW (ref 96–112)
GFR calc Af Amer: >90 mL/min (ref >90)
Glucose, Bld: 96 mg/dL (ref 70–99)
Potassium: 3.2 mEq/L — ABNORMAL LOW (ref 3.5–5.1)
Sodium: 123 mEq/L — ABNORMAL LOW (ref 135–145)
Total Protein: 7.5 g/dL (ref 6.0–8.3)

## 2011-12-27 LAB — CBC WITH DIFFERENTIAL/PLATELET
Eosinophils Absolute: 0 10*3/uL (ref 0.0–0.7)
Lymphs Abs: 1.3 10*3/uL (ref 0.7–4.0)
MCH: 35.8 pg — ABNORMAL HIGH (ref 26.0–34.0)
Neutro Abs: 4.6 10*3/uL (ref 1.7–7.7)
Neutrophils Relative %: 72 % (ref 43–77)
Platelets: 221 10*3/uL (ref 150–400)
RBC: 4.78 MIL/uL (ref 3.87–5.11)
WBC: 6.4 10*3/uL (ref 4.0–10.5)

## 2011-12-27 LAB — URINALYSIS, ROUTINE W REFLEX MICROSCOPIC
Bilirubin Urine: NEGATIVE
Hgb urine dipstick: NEGATIVE
Specific Gravity, Urine: 1.015 (ref 1.005–1.030)
pH: 6.5 (ref 5.0–8.0)

## 2011-12-27 MED ORDER — HYDRALAZINE HCL 20 MG/ML IJ SOLN: 10.0000 mg | INTRAMUSCULAR | Status: DC | PRN

## 2011-12-27 MED ORDER — ONDANSETRON HCL 4 MG/2ML IJ SOLN
4.0000 mg | Freq: Once | INTRAMUSCULAR | Status: AC
  Administered 2011-12-27: 4 mg via INTRAVENOUS
  Filled 2011-12-27: qty 2

## 2011-12-27 MED ORDER — TRAZODONE HCL 50 MG PO TABS
25.0000 mg | ORAL_TABLET | Freq: Every evening | ORAL | Status: DC | PRN
  Administered 2011-12-27 – 2011-12-28 (×2): 25 mg via ORAL
  Filled 2011-12-27 (×3): qty 1

## 2011-12-27 MED ORDER — ALBUTEROL SULFATE (5 MG/ML) 0.5% IN NEBU: 2.5000 mg | INHALATION_SOLUTION | Freq: Four times a day (QID) | RESPIRATORY_TRACT | Status: DC | PRN

## 2011-12-27 MED ORDER — SODIUM CHLORIDE 0.9 % IV BOLUS (SEPSIS)
1000.0000 mL | Freq: Once | INTRAVENOUS | Status: AC
  Administered 2011-12-27: 1000 mL via INTRAVENOUS

## 2011-12-27 MED ORDER — POTASSIUM CHLORIDE IN NACL 20-0.9 MEQ/L-% IV SOLN
INTRAVENOUS | Status: DC
  Administered 2011-12-27 – 2011-12-29 (×5): via INTRAVENOUS

## 2011-12-27 MED ORDER — PANTOPRAZOLE SODIUM 40 MG IV SOLR
40.0000 mg | Freq: Once | INTRAVENOUS | Status: AC
  Administered 2011-12-27: 40 mg via INTRAVENOUS
  Filled 2011-12-27: qty 40

## 2011-12-27 MED ORDER — SODIUM CHLORIDE 0.9 % IJ SOLN
3.0000 mL | Freq: Two times a day (BID) | INTRAMUSCULAR | Status: DC
  Administered 2011-12-27: 10:00:00 via INTRAVENOUS
  Administered 2011-12-28 (×2): 3 mL via INTRAVENOUS
  Filled 2011-12-27 (×2): qty 3

## 2011-12-27 MED ORDER — IOHEXOL 300 MG/ML  SOLN
80.0000 mL | Freq: Once | INTRAMUSCULAR | Status: AC | PRN
  Administered 2011-12-27: 80 mL via INTRAVENOUS

## 2011-12-27 MED ORDER — HYDROMORPHONE HCL PF 1 MG/ML IJ SOLN
1.0000 mg | Freq: Once | INTRAMUSCULAR | Status: AC
  Administered 2011-12-27: 1 mg via INTRAVENOUS
  Filled 2011-12-27: qty 1

## 2011-12-27 MED ORDER — PANTOPRAZOLE SODIUM 40 MG IV SOLR
40.0000 mg | Freq: Two times a day (BID) | INTRAVENOUS | Status: DC
  Administered 2011-12-27 – 2011-12-29 (×5): 40 mg via INTRAVENOUS
  Filled 2011-12-27 (×5): qty 40

## 2011-12-27 MED ORDER — ACETAMINOPHEN 650 MG RE SUPP: 650.0000 mg | Freq: Four times a day (QID) | RECTAL | Status: DC | PRN

## 2011-12-27 MED ORDER — HYDROMORPHONE HCL PF 1 MG/ML IJ SOLN
0.5000 mg | INTRAMUSCULAR | Status: DC | PRN
  Administered 2011-12-27 – 2011-12-28 (×8): 1 mg via INTRAVENOUS
  Filled 2011-12-27 (×9): qty 1

## 2011-12-27 MED ORDER — ONDANSETRON HCL 4 MG/2ML IJ SOLN
4.0000 mg | INTRAMUSCULAR | Status: DC | PRN
  Administered 2011-12-27: 4 mg via INTRAVENOUS
  Filled 2011-12-27 (×3): qty 2

## 2011-12-27 MED ORDER — SODIUM CHLORIDE 0.9 % IJ SOLN
INTRAMUSCULAR | Status: AC
  Administered 2011-12-27: 10 mL
  Filled 2011-12-27: qty 3

## 2011-12-27 NOTE — Care Management Note (Signed)
    Page 1 of 1   12/27/2011     2:39:59 PM   CARE MANAGEMENT NOTE 12/27/2011  Patient:  [name] [name], [name] [name]   Account Number:  [id number]  Date Initiated:  12/27/2011  Documentation initiated by:  Claretha Cooper  Subjective/Objective Assessment:   Pt admitted from home where she lives with finance. Pt post op esphageal procedure and began N, V, D.     Action/Plan:   Plans to DC home. No anticipated HH needs   Anticipated DC Date:  12/28/2011   Anticipated DC Plan:  HOME/SELF CARE      DC Planning Services  CM consult      Choice offered to / List presented to:             Status of service:  Completed, signed off Medicare Important Message given?   (If response is "NO", the following Medicare IM given date fields will be blank) Date Medicare IM given:   Date Additional Medicare IM given:    Discharge Disposition:  HOME/SELF CARE  Per UR Regulation:    If discussed at Long Length of Stay Meetings, dates discussed:    Comments:  12/27/11 [place]

## 2011-12-27 NOTE — ED Notes (Signed)
States that she had esophagus surgery 2 days ago.  States she is having abdominal pain with nausea and vomiting that started tonight.

## 2011-12-27 NOTE — H&P (Signed)
[facility] History and Physical  CLAIRESSA BOULET  KLK:[id number]  DOB: [birth year]   DOA: 12/27/2011    PCP:   [name], MD   Chief Complaint:  Persistent vomiting for 3 days  HPI: [name] [name] is an 56 y.o. female.   Small framed Caucasian lady with history of multiple medical problems including hemicolectomy in 2006, gastritis, esophageal ring and recurrent esophageal dilation for dysphagia. She last had esophageal dilation under propofol anesthesia August 20, and reports that since then she has had persistent vomiting number of times per day too numerous to count, and unable to keep anything down. Initially there was some blood, then some "chunky" stuff, but now it is all water material, and associated with severe central chest pain and burning.  Additionally, patient gives a history of Crohn's disease, and says because of this she has episodic diarrhea, and has been having at least 7 loose watery stools per day for the past 2 days; no blood or pus in the diary. 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  She eventually came to the emergency room at 3 AM this morning, and blood work showed hypokalemia and severe hyponatremia. Hospitalist service was called to assist with management.  She is on chronic narcotics for chronic back pain due to lumbar degenerative disc disease, and also on a PPI.  Rewiew of Systems:   All systems negative except as marked bold or noted in the HPI;  Constitutional: Negative for malaise, fever and chills. ;  Eyes: Negative for eye pain, redness and discharge. ;  ENMT: Negative for ear pain, hoarseness, nasal congestion, sinus pressure; mild sore throat. ;  Cardiovascular: Negative for palpitations, diaphoresis, dyspnea and peripheral edema. ;  Respiratory: Negative for hemoptysis, wheezing and stridor. ; occ cough,  Gastrointestinal: Negative for  constipation,  melena, blood in stool, hematemesis, jaundice and rectal bleeding. unusual weight loss..   Genitourinary: Negative for frequency, dysuria, incontinence,flank pain and hematuria; Musculoskeletal: Negative for neck pain. Negative for swelling and trauma.; back pain Skin: . Negative for pruritus, rash, abrasions, bruising and skin lesion.; ulcerations Neuro: Negative for headache, lightheadedness and neck stiffness. Negative for weakness, altered level of consciousness , altered mental status, extremity weakness, burning feet, involuntary movement, seizure and syncope.  Psych: negative, tearfulness, panic attacks, hallucinations, paranoia, suicidal or homicidal ideation; depression, insomnia   Past Medical History  Diagnosis Date  . Elevated liver enzymes 2009: BMI 24 ?Etoh ALT 94, AST 51,  NEG ANA, qIGs& ASMA    MAY 2012 AST 52 ALT 38  . Anastomotic ulcer JAN 2009  . Esophageal stricture 2009  . Diarrhea MULITIFACTORIAL    IBS, LACTOSE INTOLERANCE, SBBO, BILE-SALT  . Inflammatory bowel disease 2006 CD    SURGICAL REMISSION  . LBP (low back pain)   . Migraine   . Asthma   . Allergic rhinitis   . CTS (carpal tunnel syndrome)   . Hyperlipemia   . Anxiety   . Hemorrhoid   . Crohn disease   . BMI (body mass index) 20.0-29.9 2009 121 lbs  . 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 Shortness of breath     exertion and humidity  . GERD (gastroesophageal reflux disease)   . Hypertension   . Sleep apnea     Stop [name]    Past Surgical History  Procedure Date  . Hemicolectomy RIGHT  2006  . Hernia repair   . Carpal tunnel release LEFT  . Lumbar disc surgery   . Vocal cord surgery Sep 20, 2010    precancerous areas removed  . Esophagogastroduodenoscopy 02/07/09    mild gastritis  . Colonoscopy JAN 2009 DIARRHEA, ABD PAIN, BRBPR    ANASTOMOTIC ULCER(3MM), IH SV:[id number] ULCER, NL COLON bX  . Upper gastrointestinal endoscopy JAN 2009 ABD PAIN    GASTRITIS, ESO  RING  . Upper gastrointestinal endoscopy OCT 2010 ABD PAIN, DYSPHAGIA    DIL 15MM, GASTRITIS, NL DUODENUM  . Upper gastrointestinal endoscopy OCT 2010 DYSPHAGIA    DIL 17 MM, GASTRITIS, NL DUODENUM  . Dilation and curettage of uterus   . Bilateral salpingoophorectomy   . Wisdom tooth extraction     pin placement due to broken jaw  . Colon surgery   . Back surgery   . Esophageal dilation 12/24/2011    Procedure: ESOPHAGEAL DILATION;  Surgeon: Danie Binder, MD;  Location: [facility];  Service: Endoscopy;  Laterality: N/A;  with Propofol;  Dilated with Savory Dilators 12.90m to18m . Esophageal biopsy 12/24/2011    Procedure: BIOPSY;  Surgeon: SaDanie BinderMD;  Location: [facility];  Service: Endoscopy;;  Gastric Biopsies    Medications:  HOME MEDS: Prior to Admission medications   Medication Sig Start Date End Date Taking? Authorizing Provider  albuterol (PROAIR HFA) 108 (90 BASE) MCG/ACT inhaler Inhale 2 puffs into the lungs every 6 (six) hours as needed. For shortness of breath    Historical Provider, MD  Alum & Mag Hydroxide-Simeth (MAGIC MOUTHWASH) SOLN Take 5 mLs by mouth 2 (two) times daily. SWISH & SWALLOW 12/11/11   SaDanie BinderMD  amLODipine (NORVASC) 5 MG tablet Take 1 tablet (5 mg total) by mouth daily. 09/24/11   KaAlycia RossettiMD  budesonide-formoterol ([facility]160-4.5 MCG/ACT inhaler Inhale 2 puffs into the lungs 2 (two) times daily. 10/15/10   MaFayrene HelperMD  calcium carbonate (TUMS - DOSED IN MG ELEMENTAL CALCIUM) 500 MG chewable tablet Chew 1 tablet by mouth as needed. For heartburn    Historical Provider, MD  diazepam (VALIUM) 5 MG tablet Take 2.5 mg by mouth 2 (two) times daily as needed. For spasms 12/02/11   KaAlycia RossettiMD  fluticasone ([facility]50 MCG/ACT nasal spray Place 1 spray into the nose 2 (two) times daily. 05/13/11 05/12/12  KaAlycia RossettiMD  gabapentin (NEURONTIN) 300 MG capsule Take 300 mg by mouth 2 (two) times daily.  10/18/11   Historical  Provider, MD  HYDROcodone-acetaminophen (NORCO) 10-325 MG per tablet Take 1 tablet by mouth every 6 (six) hours as needed. For pain 11/26/11   Historical Provider, MD  pantoprazole (PROTONIX) 40 MG tablet 1 po 30 minutes prior to your first meal 12/24/11   SaDanie BinderMD  quinapril-hydrochlorothiazide (ACCURETIC) 10-12.5 MG per tablet Take 1 tablet by mouth 2 (two) times daily.  09/25/11   MaFayrene HelperMD     Allergies:  Allergies  Allergen Reactions  . Lovastatin     Chest pain    Social History:   reports that she quit smoking about 8 months ago. Her smoking use included Cigarettes. She has a 30 pack-year smoking history. She does not have any smokeless tobacco history on file. She reports that she does not drink alcohol or use illicit drugs.  Family History: Family History  Problem Relation Age of Onset  . Colon cancer Neg Hx   .  Colon polyps Neg Hx   . Hypothyroidism Mother   . Heart disease Father   . Hypothyroidism Daughter      Physical Exam: Filed Vitals:   12/27/11 0320 12/27/11 0524  BP: 148/111 137/91  Pulse: 95 103  Temp: 98.5 F (36.9 C)   TempSrc: Oral   Resp: 20 20  Height: 5' (1.524 m)   Weight: 52.164 kg (115 lb)   SpO2: 95% 96%   Blood pressure 137/91, pulse 103, temperature 98.5 F (36.9 C), temperature source Oral, resp. rate 20, height 5' (1.524 m), weight 52.164 kg (115 lb), SpO2 96.00%.  GEN:  Pleasant but anxious looking middle-aged Caucasian lady lying in the stretcher asking for something for her stomach pains; cooperative with exam PSYCH:  alert and oriented x4; HEENT: Mucous membranes pink, dry, and anicteric; PERRLA; EOM intact; no cervical lymphadenopathy nor thyromegaly or carotid bruit; no JVD; Breasts:: Not examined CHEST WALL: No tenderness CHEST: Normal respiration, occasional rhonchi  HEART: Tachycardic, regular rhythm; no murmurs rubs or gallops BACK: ; no CVA tenderness ABDOMEN: Obese, soft diffuse tenderness; no  masses, no organomegaly, normal abdominal bowel sounds; ; no intertriginous candida. Rectal Exam: Not done EXTREMITIES: No bone or joint deformity; no edema; no ulcerations. Genitalia: not examined PULSES: 2+ and symmetric SKIN: Normal hydration no rash or ulceration; seems ruddy from sun exposure CNS: Cranial nerves 2-12 grossly intact no focal lateralizing neurologic deficit   Labs on Admission:  Basic Metabolic Panel:  Lab 41/66/06 0327 12/20/11 1200  NA 123* 138  K 3.2* 3.9  CL 81* 98  CO2 20 26  GLUCOSE 96 89  BUN <3* 6  CREATININE 0.42* 0.46*  CALCIUM 9.3 9.5  MG -- --  PHOS -- --   Liver Function Tests:  Lab 12/27/11 0327 12/24/11 1009  AST 55* 76*  ALT 63* 74*  ALKPHOS 150* 137*  BILITOT 0.6 0.8  PROT 7.5 6.1  ALBUMIN 4.2 3.6    Lab 12/27/11 0327  LIPASE 20  AMYLASE --   No results found for this basename: AMMONIA:5 in the last 168 hours CBC:  Lab 12/27/11 0327 12/20/11 1200  WBC 6.4 --  NEUTROABS 4.6 --  HGB 17.1* 15.5*  HCT 45.5 43.1  MCV 95.2 --  PLT 221 --   Cardiac Enzymes: No results found for this basename: CKTOTAL:5,CKMB:5,CKMBINDEX:5,TROPONINI:5 in the last 168 hours BNP: No components found with this basename: POCBNP:5 CBG: No results found for this basename: GLUCAP:5 in the last 168 hours  Results for orders placed during the hospital encounter of 12/27/11 (from the past 48 hour(s))  CBC WITH DIFFERENTIAL     Status: Abnormal   Collection Time   12/27/11  3:27 AM      Component Value Range Comment   WBC 6.4  4.0 - 10.5 K/uL    RBC 4.78  3.87 - 5.11 MIL/uL    Hemoglobin 17.1 (*) 12.0 - 15.0 g/dL    HCT 45.5  36.0 - 46.0 %    MCV 95.2  78.0 - 100.0 fL    MCH 35.8 (*) 26.0 - 34.0 pg    MCHC 37.6 (*) 30.0 - 36.0 g/dL    RDW 12.3  11.5 - 15.5 %    Platelets 221  150 - 400 K/uL    Neutrophils Relative 72  43 - 77 %    Neutro Abs 4.6  1.7 - 7.7 K/uL    Lymphocytes Relative 20  12 - 46 %    Lymphs Abs 1.3  0.7 - 4.0 K/uL    Monocytes  Relative 8  3 - 12 %    Monocytes Absolute 0.5  0.1 - 1.0 K/uL    Eosinophils Relative 0  0 - 5 %    Eosinophils Absolute 0.0  0.0 - 0.7 K/uL    Basophils Relative 0  0 - 1 %    Basophils Absolute 0.0  0.0 - 0.1 K/uL   COMPREHENSIVE METABOLIC PANEL     Status: Abnormal   Collection Time   12/27/11  3:27 AM      Component Value Range Comment   Sodium 123 (*) 135 - 145 mEq/L    Potassium 3.2 (*) 3.5 - 5.1 mEq/L    Chloride 81 (*) 96 - 112 mEq/L    CO2 20  19 - 32 mEq/L    Glucose, Bld 96  70 - 99 mg/dL    BUN <3 (*) 6 - 23 mg/dL    Creatinine, Ser 0.42 (*) 0.50 - 1.10 mg/dL    Calcium 9.3  8.4 - 10.5 mg/dL    Total Protein 7.5  6.0 - 8.3 g/dL    Albumin 4.2  3.5 - 5.2 g/dL    AST 55 (*) 0 - 37 U/L    ALT 63 (*) 0 - 35 U/L    Alkaline Phosphatase 150 (*) 39 - 117 U/L    Total Bilirubin 0.6  0.3 - 1.2 mg/dL    GFR calc non Af Amer >90  >90 mL/min    GFR calc Af Amer >90  >90 mL/min   LIPASE, BLOOD     Status: Normal   Collection Time   12/27/11  3:27 AM      Component Value Range Comment   Lipase 20  11 - 59 U/L   URINALYSIS, ROUTINE W REFLEX MICROSCOPIC     Status: Abnormal   Collection Time   12/27/11  3:56 AM      Component Value Range Comment   Color, Urine YELLOW  YELLOW    APPearance CLEAR  CLEAR    Specific Gravity, Urine 1.015  1.005 - 1.030    pH 6.5  5.0 - 8.0    Glucose, UA NEGATIVE  NEGATIVE mg/dL    Hgb urine dipstick NEGATIVE  NEGATIVE    Bilirubin Urine NEGATIVE  NEGATIVE    Ketones, ur 15 (*) NEGATIVE mg/dL    Protein, ur NEGATIVE  NEGATIVE mg/dL    Urobilinogen, UA 0.2  0.0 - 1.0 mg/dL    Nitrite NEGATIVE  NEGATIVE    Leukocytes, UA NEGATIVE  NEGATIVE MICROSCOPIC NOT DONE ON URINES WITH NEGATIVE PROTEIN, BLOOD, LEUKOCYTES, NITRITE, OR GLUCOSE <1000 mg/dL.     Radiological Exams on Admission: Dg Abd Acute W/chest  12/27/2011  *RADIOLOGY REPORT*  Clinical Data: Vomiting, diarrhea, and epigastric pain.  Esophageal dilatation on 12/25/2011.  ACUTE ABDOMEN  SERIES (ABDOMEN 2 VIEW & CHEST 1 VIEW)  Comparison: Chest 02/15/2010.  Findings: Normal heart size and pulmonary vascularity. Emphysematous changes scattered fibrosis in the lungs.  No focal airspace consolidation.  No blunting of costophrenic angles.  No pneumothorax or pneumomediastinum.  Mediastinal contours appear intact.  Old right rib fracture.  No significant change since previous study.  Postoperative changes in the mid abdomen.  Scattered gas and stool in the colon.  No small or large bowel distension.  No free air. No abnormal air fluid levels.  Visualized bones appear intact.  IMPRESSION: Emphysematous changes in the lungs.  No evidence of active pulmonary  disease.  Nonobstructive bowel gas pattern.   Original Report Authenticated By: Neale Burly, M.D.     Assessment/Plan Present on Admission:  .Persistent vomiting .Hyponatremia Hypokalemia  .BACK PAIN, LUMBAR, WITH RADICULOPATHY .TRANSAMINASES, SERUM, ELEVATED .ANXIETY .Gastritis .Chronic pain .Diarrhea, episodic .HYPERTENSION   PLAN: Since her blood work is very strongly suggestive of moderate severe dehydration, we'll admit for hydration and repletion of serum potassium and sodium. Will consult Dr. Lovey Newcomer feels for assistance with management; Will keep her n.p.o. and give when necessary hydralazine to assist with management of her blood pressure; When necessary intravenous narcotics for pain, as well as round-the-clock protonix  Other plans as per orders.  Code Status: FULL CODE  Her husband was present at the interview and exam and plan of care was discussed with them both   Creedence Kunesh Nocturnist [facility] Pager [phone]   12/27/2011, 6:26 AM

## 2011-12-27 NOTE — ED Provider Notes (Signed)
History     CSN: [id number]  Arrival date & time 12/27/11  0257   First MD Initiated Contact with Patient 12/27/11 0320      Chief Complaint  Patient presents with  . Abdominal Pain  . Nausea  . Emesis  . Chest Pain    (Consider location/radiation/quality/duration/timing/severity/associated sxs/prior treatment) Patient is a 56 y.o. female presenting with abdominal pain, vomiting, and chest pain. The history is provided by the patient (pt has been vomiting for 24 hours). No language interpreter was used.  Abdominal Pain The primary symptoms of the illness include abdominal pain, fatigue and vomiting. The primary symptoms of the illness do not include diarrhea. The current episode started 2 days ago. The onset of the illness was gradual. The problem has not changed since onset. Associated with: nothing. The patient states that she believes she is currently not pregnant. The patient has not had a change in bowel habit. Risk factors: biopsy. Symptoms associated with the illness do not include chills, hematuria, frequency or back pain. Significant associated medical issues include PUD.  Emesis  Associated symptoms include abdominal pain. Pertinent negatives include no chills, no cough, no diarrhea and no headaches.  Chest Pain Primary symptoms include fatigue, abdominal pain and vomiting. Pertinent negatives for primary symptoms include no cough.  Pertinent negatives for past medical history include no seizures.     Past Medical History  Diagnosis Date  . Elevated liver enzymes 2009: BMI 24 ?Etoh ALT 94, AST 51,  NEG ANA, qIGs& ASMA    MAY 2012 AST 52 ALT 38  . Anastomotic ulcer JAN 2009  . Esophageal stricture 2009  . Diarrhea MULITIFACTORIAL    IBS, LACTOSE INTOLERANCE, SBBO, BILE-SALT  . Inflammatory bowel disease 2006 CD    SURGICAL REMISSION  . LBP (low back pain)   . Migraine   . Asthma   . Allergic rhinitis   . CTS (carpal tunnel syndrome)   . Hyperlipemia   . Anxiety    . Hemorrhoid   . Crohn disease   . BMI (body mass index) 20.0-29.9 2009 121 lbs  . 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 Shortness of breath     exertion and humidity  . GERD (gastroesophageal reflux disease)   . Hypertension   . Sleep apnea     Stop [name]    Past Surgical History  Procedure Date  . Hemicolectomy RIGHT 2006  . Hernia repair   . Carpal tunnel release LEFT  . Lumbar disc surgery   . Vocal cord surgery Sep 20, 2010    precancerous areas removed  . Esophagogastroduodenoscopy 02/07/09    mild gastritis  . Colonoscopy JAN 2009 DIARRHEA, ABD PAIN, BRBPR    ANASTOMOTIC ULCER(3MM), IH BH:ALPFXT ULCER, NL COLON bX  . Upper gastrointestinal endoscopy JAN 2009 ABD PAIN    GASTRITIS, ESO RING  . Upper gastrointestinal endoscopy OCT 2010 ABD PAIN, DYSPHAGIA    DIL 15MM, GASTRITIS, NL DUODENUM  . Upper gastrointestinal endoscopy OCT 2010 DYSPHAGIA    DIL 17 MM, GASTRITIS, NL DUODENUM  . Dilation and curettage of uterus   . Bilateral salpingoophorectomy   . Wisdom tooth extraction     pin placement due to broken jaw  . Colon surgery   . Back surgery   . Esophageal dilation 12/24/2011    Procedure: ESOPHAGEAL DILATION;  Surgeon: Danie Binder, MD;  Location: [facility];  Service: Endoscopy;  Laterality: N/A;  with Propofol;  Dilated with Azzie Almas  Dilators 12.58m to11m . Esophageal biopsy 12/24/2011    Procedure: BIOPSY;  Surgeon: SaDanie BinderMD;  Location: [facility];  Service: Endoscopy;;  Gastric Biopsies    Family History  Problem Relation Age of Onset  . Colon cancer Neg Hx   . Colon polyps Neg Hx   . Hypothyroidism Mother   . Heart disease Father   . Hypothyroidism Daughter     History  Substance Use Topics  . Smoking status: Former Smoker -- 1.0 packs/day for 30 years    Types: Cigarettes  . Smokeless tobacco: Not on file  . Alcohol Use: No    OB History    Grav Para Term Preterm Abortions TAB SAB Ect Mult Living                  Review of  Systems  Constitutional: Positive for fatigue. Negative for chills.  HENT: Negative for congestion, sinus pressure and ear discharge.   Eyes: Negative for discharge.  Respiratory: Negative for cough.   Cardiovascular: Negative for chest pain.  Gastrointestinal: Positive for vomiting and abdominal pain. Negative for diarrhea.  Genitourinary: Negative for frequency and hematuria.  Musculoskeletal: Negative for back pain.  Skin: Negative for rash.  Neurological: Negative for seizures and headaches.  Hematological: Negative.   Psychiatric/Behavioral: Negative for hallucinations.    Allergies  Lovastatin  Home Medications   Current Outpatient Rx  Name Route Sig Dispense Refill  . ALBUTEROL SULFATE HFA 108 (90 BASE) MCG/ACT IN AERS Inhalation Inhale 2 puffs into the lungs every 6 (six) hours as needed. For shortness of breath    . MAGIC MOUTHWASH Oral Take 5 mLs by mouth 2 (two) times daily. SWISH & SWALLOW    . AMLODIPINE BESYLATE 5 MG PO TABS Oral Take 1 tablet (5 mg total) by mouth daily. 30 tablet 3  . BUDESONIDE-FORMOTEROL FUMARATE 160-4.5 MCG/ACT IN AERO Inhalation Inhale 2 puffs into the lungs 2 (two) times daily. 3 Inhaler 3    Health dept.  . Marland KitchenALCIUM CARBONATE ANTACID 500 MG PO CHEW Oral Chew 1 tablet by mouth as needed. For heartburn    . DIAZEPAM 5 MG PO TABS Oral Take 2.5 mg by mouth 2 (two) times daily as needed. For spasms    . FLUTICASONE PROPIONATE 50 MCG/ACT NA SUSP Nasal Place 1 spray into the nose 2 (two) times daily. 16 g 2  . GABAPENTIN 300 MG PO CAPS Oral Take 300 mg by mouth 2 (two) times daily.     . Marland KitchenYDROCODONE-ACETAMINOPHEN 10-325 MG PO TABS Oral Take 1 tablet by mouth every 6 (six) hours as needed. For pain    . PANTOPRAZOLE SODIUM 40 MG PO TBEC  1 po 30 minutes prior to your first meal 31 tablet 11  . QUINAPRIL-HYDROCHLOROTHIAZIDE 10-12.5 MG PO TABS Oral Take 1 tablet by mouth 2 (two) times daily.       BP 148/111  Pulse 95  Temp 98.5 F (36.9 C) (Oral)   Resp 20  Ht 5' (1.524 m)  Wt 115 lb (52.164 kg)  BMI 22.46 kg/m2  SpO2 95%  Physical Exam  Constitutional: She is oriented to person, place, and time. She appears well-developed.  HENT:  Head: Normocephalic and atraumatic.  Eyes: Conjunctivae and EOM are normal. No scleral icterus.  Neck: Neck supple. No thyromegaly present.  Cardiovascular: Normal rate and regular rhythm.  Exam reveals no gallop and no friction rub.   No murmur heard. Pulmonary/Chest: No stridor. She has no wheezes. She  has no rales. She exhibits no tenderness.  Abdominal: She exhibits no distension. There is tenderness. There is no rebound.  Musculoskeletal: Normal range of motion. She exhibits no edema.  Lymphadenopathy:    She has no cervical adenopathy.  Neurological: She is oriented to person, place, and time. Coordination normal.  Skin: No rash noted. No erythema.  Psychiatric: She has a normal mood and affect. Her behavior is normal.    ED Course  Procedures (including critical care time)  Labs Reviewed  CBC WITH DIFFERENTIAL - Abnormal; Notable for the following:    Hemoglobin 17.1 (*)     MCH 35.8 (*)     MCHC 37.6 (*)     All other components within normal limits  COMPREHENSIVE METABOLIC PANEL - Abnormal; Notable for the following:    Sodium 123 (*)     Potassium 3.2 (*)     Chloride 81 (*)     BUN <3 (*)     Creatinine, Ser 0.42 (*)     AST 55 (*)     ALT 63 (*)     Alkaline Phosphatase 150 (*)     All other components within normal limits  URINALYSIS, ROUTINE W REFLEX MICROSCOPIC - Abnormal; Notable for the following:    Ketones, ur 15 (*)     All other components within normal limits  LIPASE, BLOOD   Dg Abd Acute W/chest  12/27/2011  *RADIOLOGY REPORT*  Clinical Data: Vomiting, diarrhea, and epigastric pain.  Esophageal dilatation on 12/25/2011.  ACUTE ABDOMEN SERIES (ABDOMEN 2 VIEW & CHEST 1 VIEW)  Comparison: Chest 02/15/2010.  Findings: Normal heart size and pulmonary vascularity.  Emphysematous changes scattered fibrosis in the lungs.  No focal airspace consolidation.  No blunting of costophrenic angles.  No pneumothorax or pneumomediastinum.  Mediastinal contours appear intact.  Old right rib fracture.  No significant change since previous study.  Postoperative changes in the mid abdomen.  Scattered gas and stool in the colon.  No small or large bowel distension.  No free air. No abnormal air fluid levels.  Visualized bones appear intact.  IMPRESSION: Emphysematous changes in the lungs.  No evidence of active pulmonary disease.  Nonobstructive bowel gas pattern.   Original Report Authenticated By: Neale Burly, M.D.      1. Hyponatremia   2. Vomiting       MDM         Maudry Diego, MD 12/27/11 [phone]

## 2011-12-27 NOTE — Consult Note (Signed)
Reason for Consult: nausea and vomiting Referring Physician: Lissa [name] is an 56 y.o. female.  HPI: [name] [name] is a 56 yr old female admitted with nausea, vomiting and diarrhea. N and V started the 21st.  She tried to eat a bowl of soup and she vomited it up. She was having 7 stools a day. She does tell me that she has diarrhea on a daily basis since undergoing a hemicolectomy in 206 by Dr. Tamala Julian. She tells me she feels terrible now. He chest is hurting.She has chest  pain on movement. Hurts to take a deep breath.  She denies having a fever. She tells me foods still feel like they are lodging in her esophagus.  She also c/o chest pain and pain with deep inspiration. She underwent an EGD 12/23/2004 for dysphagia. She tells me she vomited a small amount bright red blood the day after the EGD. H. Pylori is negative.  No recent antibiotics.   EGD/ED 12/24/2011:A stricture was found in the distal esophagus NARROWING THE LUMEN TO  12-13 MM. Moderate gastritis was found & BIOPSIED VIA COLD  FORCEPS. MILD Duodenitis was found in the bulb of the duodenum.  Dilation was then performed at the gastroesphageal junction  Dilator: Savary over guidewire Size(s): 12.8-16 MM Resistance:  minimal Heme: yes  COMPLICATIONS: There were no complications.  ENDOSCOPIC IMPRESSION:  1. A stricture was found in the distal esophagus  2. Moderate gastritis  3. MILD Duodenitis      Past Medical History  Diagnosis Date  . Elevated liver enzymes 2009: BMI 24 ?Etoh ALT 94, AST 51,  NEG ANA, qIGs& ASMA    MAY 2012 AST 52 ALT 38  . Anastomotic ulcer JAN 2009  . Esophageal stricture 2009  . Diarrhea MULITIFACTORIAL    IBS, LACTOSE INTOLERANCE, SBBO, BILE-SALT  . Inflammatory bowel disease 2006 CD    SURGICAL REMISSION  . LBP (low back pain)   . Migraine   . Asthma   . Allergic rhinitis   . CTS (carpal tunnel syndrome)   . Hyperlipemia   . Anxiety   . Hemorrhoid   . Crohn disease   . BMI (body mass  index) 20.0-29.9 2009 121 lbs  . 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 Shortness of breath     exertion and humidity  . GERD (gastroesophageal reflux disease)   . Hypertension   . Sleep apnea     Stop [name]    Past Surgical History  Procedure Date  . Hemicolectomy RIGHT 2006  . Hernia repair   . Carpal tunnel release LEFT  . Lumbar disc surgery   . Vocal cord surgery Sep 20, 2010    precancerous areas removed  . Esophagogastroduodenoscopy 02/07/09    mild gastritis  . Colonoscopy JAN 2009 DIARRHEA, ABD PAIN, BRBPR    ANASTOMOTIC ULCER(3MM), IH SJ:GGEZMO ULCER, NL COLON bX  . Upper gastrointestinal endoscopy JAN 2009 ABD PAIN    GASTRITIS, ESO RING  . Upper gastrointestinal endoscopy OCT 2010 ABD PAIN, DYSPHAGIA    DIL 15MM, GASTRITIS, NL DUODENUM  . Upper gastrointestinal endoscopy OCT 2010 DYSPHAGIA    DIL 17 MM, GASTRITIS, NL DUODENUM  . Dilation and curettage of uterus   . Bilateral salpingoophorectomy   . Wisdom tooth extraction     pin placement due to broken jaw  . Colon surgery   . Back surgery   . Esophageal dilation 12/24/2011    Procedure: ESOPHAGEAL DILATION;  Surgeon:  Danie Binder, MD;  Location: [facility];  Service: Endoscopy;  Laterality: N/A;  with Propofol;  Dilated with Savory Dilators 12.50m to180m . Esophageal biopsy 12/24/2011    Procedure: BIOPSY;  Surgeon: SaDanie BinderMD;  Location: [facility];  Service: Endoscopy;;  Gastric Biopsies    Family History  Problem Relation Age of Onset  . Colon cancer Neg Hx   . Colon polyps Neg Hx   . Hypothyroidism Mother   . Heart disease Father   . Hypothyroidism Daughter     Social History:  reports that she quit smoking about 8 months ago. Her smoking use included Cigarettes. She has a 30 pack-year smoking history. She does not have any smokeless tobacco history on file. She reports that she does not drink alcohol or use illicit drugs.  Allergies:  Allergies  Allergen Reactions  . Lovastatin      Chest pain    Medications: I have reviewed the patient's current medications.  Results for orders placed during the hospital encounter of 12/27/11 (from the past 48 hour(s))  CBC WITH DIFFERENTIAL     Status: Abnormal   Collection Time   12/27/11  3:27 AM      Component Value Range Comment   WBC 6.4  4.0 - 10.5 K/uL    RBC 4.78  3.87 - 5.11 MIL/uL    Hemoglobin 17.1 (*) 12.0 - 15.0 g/dL    HCT 45.5  36.0 - 46.0 %    MCV 95.2  78.0 - 100.0 fL    MCH 35.8 (*) 26.0 - 34.0 pg    MCHC 37.6 (*) 30.0 - 36.0 g/dL    RDW 12.3  11.5 - 15.5 %    Platelets 221  150 - 400 K/uL    Neutrophils Relative 72  43 - 77 %    Neutro Abs 4.6  1.7 - 7.7 K/uL    Lymphocytes Relative 20  12 - 46 %    Lymphs Abs 1.3  0.7 - 4.0 K/uL    Monocytes Relative 8  3 - 12 %    Monocytes Absolute 0.5  0.1 - 1.0 K/uL    Eosinophils Relative 0  0 - 5 %    Eosinophils Absolute 0.0  0.0 - 0.7 K/uL    Basophils Relative 0  0 - 1 %    Basophils Absolute 0.0  0.0 - 0.1 K/uL   COMPREHENSIVE METABOLIC PANEL     Status: Abnormal   Collection Time   12/27/11  3:27 AM      Component Value Range Comment   Sodium 123 (*) 135 - 145 mEq/L    Potassium 3.2 (*) 3.5 - 5.1 mEq/L    Chloride 81 (*) 96 - 112 mEq/L    CO2 20  19 - 32 mEq/L    Glucose, Bld 96  70 - 99 mg/dL    BUN <3 (*) 6 - 23 mg/dL    Creatinine, Ser 0.42 (*) 0.50 - 1.10 mg/dL    Calcium 9.3  8.4 - 10.5 mg/dL    Total Protein 7.5  6.0 - 8.3 g/dL    Albumin 4.2  3.5 - 5.2 g/dL    AST 55 (*) 0 - 37 U/L    ALT 63 (*) 0 - 35 U/L    Alkaline Phosphatase 150 (*) 39 - 117 U/L    Total Bilirubin 0.6  0.3 - 1.2 mg/dL    GFR calc non Af Amer >90  >90 mL/min  GFR calc Af Amer >90  >90 mL/min   LIPASE, BLOOD     Status: Normal   Collection Time   12/27/11  3:27 AM      Component Value Range Comment   Lipase 20  11 - 59 U/L   URINALYSIS, ROUTINE W REFLEX MICROSCOPIC     Status: Abnormal   Collection Time   12/27/11  3:56 AM      Component Value Range Comment    Color, Urine YELLOW  YELLOW    APPearance CLEAR  CLEAR    Specific Gravity, Urine 1.015  1.005 - 1.030    pH 6.5  5.0 - 8.0    Glucose, UA NEGATIVE  NEGATIVE mg/dL    Hgb urine dipstick NEGATIVE  NEGATIVE    Bilirubin Urine NEGATIVE  NEGATIVE    Ketones, ur 15 (*) NEGATIVE mg/dL    Protein, ur NEGATIVE  NEGATIVE mg/dL    Urobilinogen, UA 0.2  0.0 - 1.0 mg/dL    Nitrite NEGATIVE  NEGATIVE    Leukocytes, UA NEGATIVE  NEGATIVE MICROSCOPIC NOT DONE ON URINES WITH NEGATIVE PROTEIN, BLOOD, LEUKOCYTES, NITRITE, OR GLUCOSE <1000 mg/dL.    Dg Abd Acute W/chest  12/27/2011  *RADIOLOGY REPORT*  Clinical Data: Vomiting, diarrhea, and epigastric pain.  Esophageal dilatation on 12/25/2011.  ACUTE ABDOMEN SERIES (ABDOMEN 2 VIEW & CHEST 1 VIEW)  Comparison: Chest 02/15/2010.  Findings: Normal heart size and pulmonary vascularity. Emphysematous changes scattered fibrosis in the lungs.  No focal airspace consolidation.  No blunting of costophrenic angles.  No pneumothorax or pneumomediastinum.  Mediastinal contours appear intact.  Old right rib fracture.  No significant change since previous study.  Postoperative changes in the mid abdomen.  Scattered gas and stool in the colon.  No small or large bowel distension.  No free air. No abnormal air fluid levels.  Visualized bones appear intact.  IMPRESSION: Emphysematous changes in the lungs.  No evidence of active pulmonary disease.  Nonobstructive bowel gas pattern.   Original Report Authenticated By: Neale Burly, M.D.     ROS Blood pressure 137/91, pulse 103, temperature 98.5 F (36.9 C), temperature source Oral, resp. rate 20, height 5' (1.524 m), weight 122 lb 1.6 oz (55.384 kg), SpO2 96.00%. Physical ExamAlert and oriented. Skin warm and dry. Oral mucosa is moist.   . Sclera anicteric, conjunctivae is pink. Thyroid not enlarged. No cervical lymphadenopathy. Lungs clear. Heart regular rate and rhythm.  Abdomen is soft. Bowel sounds are positive. No  hepatomegaly. No abdominal masses felt. Rt upper quadrant pain/tenderness which she says is chronic.  No edema to lower extremities. Patient is alert and oriented.   Assessment/Plan: Nausea and vomiting. Diarrhea. No diarrhea since admission. Stool studies are pending. Agree with IV Protonix.  will discuss with Dr. Laural Golden.  CT chest with CM   SETZER,TERRI W 12/27/2011, 9:14 AM     GI attending note; Patient interviewed and examined. Records reviewed including chest CT that be requested. Patient underwent esophageal dilation by Dr. Barney Drain on 12/24/2011 with Savary system from 12.8-16 mm. She has developed chest pain and inability to swallow liquids or solids. This afternoon she has been able to swallow her saliva but continues to complain of chest pain even when she coughs or moves. I have reviewed chest CT films with Dr. Thornton Papas. She has thickening to distal esophagus but no evidence of pneumomediastinum. She does give history of chest congestion prior to having EGD which may be adding to her chest symptoms. At  this point I do not see any indication for antibiotics. Suspect her symptoms are secondary to esophageal ulceration secondary to dilation of esophageal stricture. Her symptoms should gradually improve. Patient will remain n.p.o. except for ice chips until evaluated in a.m.

## 2011-12-27 NOTE — Progress Notes (Signed)
This lady was admitted earlier today by Dr. Megan Salon for intractable vomiting and diarrhea.  Patient was seen and examined, database reviewed  She reports onset of her symptoms Occurring after she underwent recent EGD on 8/20 with esophageal dilatation. She reports vomiting started after that as well as chest/abdominal pain. She has chronic diarrhea and has a history of Crohn disease. She does not report being on any maintenance immunosuppressives. She is unable to tolerate any by mouth. Basic labs indicated a significant dehydration, hyponatremia/hypochloremia consistent with persistent vomiting. She is CT chest today which showed some esophagitis but no signs of perforation. GI consultation has been requested and she will be seen by Dr. Laural Golden later today. We'll continue hydration and replacement of electrolytes. Await further recommendations from GI.

## 2011-12-27 NOTE — ED Notes (Signed)
Pt now states she is having heart pain

## 2011-12-27 NOTE — Progress Notes (Signed)
Utilization Review Complete  

## 2011-12-28 LAB — COMPREHENSIVE METABOLIC PANEL
Alkaline Phosphatase: 118 U/L — ABNORMAL HIGH (ref 39–117)
BUN: 6 mg/dL (ref 6–23)
Calcium: 8.9 mg/dL (ref 8.4–10.5)
Creatinine, Ser: 0.52 mg/dL (ref 0.50–1.10)
GFR calc Af Amer: >90 mL/min (ref >90)
Glucose, Bld: 50 mg/dL — ABNORMAL LOW (ref 70–99)
Total Protein: 5.9 g/dL — ABNORMAL LOW (ref 6.0–8.3)

## 2011-12-28 LAB — CBC
HCT: 39 % (ref 36.0–46.0)
Hemoglobin: 13.4 g/dL (ref 12.0–15.0)
MCH: 35 pg — ABNORMAL HIGH (ref 26.0–34.0)
MCHC: 34.4 g/dL (ref 30.0–36.0)
MCV: 101.8 fL — ABNORMAL HIGH (ref 78.0–100.0)

## 2011-12-28 MED ORDER — SUCRALFATE 1 GM/10ML PO SUSP
1.0000 g | Freq: Three times a day (TID) | ORAL | Status: DC
  Administered 2011-12-28 – 2011-12-29 (×4): 1 g via ORAL
  Filled 2011-12-28 (×5): qty 10

## 2011-12-28 MED ORDER — BIOTENE DRY MOUTH MT LIQD
15.0000 mL | Freq: Two times a day (BID) | OROMUCOSAL | Status: DC
  Administered 2011-12-28 (×2): 15 mL via OROMUCOSAL

## 2011-12-28 MED ORDER — CHLORHEXIDINE GLUCONATE 0.12 % MT SOLN
15.0000 mL | Freq: Two times a day (BID) | OROMUCOSAL | Status: DC
  Administered 2011-12-28 – 2011-12-29 (×3): 15 mL via OROMUCOSAL
  Filled 2011-12-28 (×3): qty 15

## 2011-12-28 MED ORDER — OXYCODONE HCL 5 MG PO TABS
5.0000 mg | ORAL_TABLET | ORAL | Status: DC | PRN
  Administered 2011-12-28 – 2011-12-29 (×9): 5 mg via ORAL
  Filled 2011-12-28 (×9): qty 1

## 2011-12-28 MED ORDER — ALUM & MAG HYDROXIDE-SIMETH 200-200-20 MG/5ML PO SUSP
30.0000 mL | Freq: Once | ORAL | Status: AC
  Administered 2011-12-28: 30 mL via ORAL
  Filled 2011-12-28: qty 30

## 2011-12-28 MED ORDER — SODIUM CHLORIDE 0.9 % IJ SOLN
INTRAMUSCULAR | Status: AC
  Administered 2011-12-28: 10 mL
  Filled 2011-12-28: qty 3

## 2011-12-28 NOTE — Progress Notes (Signed)
Subjective: This lady feels much improved but has not started any kind of diet yet. She has been seen by gastroenterology, who will start her on a full liquid diet today, decrease IV fluids and start on sucralfate.           Physical Exam: Blood pressure 141/64, pulse 85, temperature 97.5 F (36.4 C), temperature source Oral, resp. rate 20, height 5' (1.524 m), weight 61.462 kg (135 lb 8 oz), SpO2 97.00%. She looks systemically well. She does not appear to be in any pain. Heart sounds are present and normal without murmurs. Lung fields are clear. She is alert and orientated.   Investigations:     Basic Metabolic Panel:  Basename 12/28/11 0615 12/27/11 0327  NA 141 123*  K 3.9 3.2*  CL 104 81*  CO2 20 20  GLUCOSE 50* 96  BUN 6 <3*  CREATININE 0.52 0.42*  CALCIUM 8.9 9.3  MG -- --  PHOS -- --   Liver Function Tests:  [facility] 12/28/11 0615 12/27/11 0327  AST 33 55*  ALT 38* 63*  ALKPHOS 118* 150*  BILITOT 0.8 0.6  PROT 5.9* 7.5  ALBUMIN 3.3* 4.2     CBC:  Basename 12/28/11 0615 12/27/11 0327  WBC 5.1 6.4  NEUTROABS -- 4.6  HGB 13.4 17.1*  HCT 39.0 45.5  MCV 101.8* 95.2  PLT 190 221    Ct Chest W Contrast  12/27/2011  *RADIOLOGY REPORT*  Clinical Data: Chest pain  CT CHEST WITH CONTRAST  Technique:  Multidetector CT imaging of the chest was performed following the standard protocol during bolus administration of intravenous contrast.  Contrast: 75m OMNIPAQUE IOHEXOL 300 MG/ML  SOLN  Comparison: None.  Findings: Negative abnormal mediastinal adenopathy.  Negative pericardial effusion.  No pneumothorax.  No pleural effusion.  Left pleural node on image 18.  Similar pleural node on image 29. Dependent atelectasis at the lung bases.  2 mm right upper lobe nodule on image 19.  Scattered peripheral linear opacities likely related to volume loss.  No destructive bone lesion.  T7-T8 central disc protrusion is suspected.  Images of the upper abdomen  demonstrate diffuse hepatic steatosis. There is prominence of the adrenal glands without focal mass.  There is wall thickening primarily involving the mid and distal to thirds of the esophagus which is moderate in degree.  There is probably some edema within the neck and lower esophageal wall. Hiatal hernia is noted.  IMPRESSION: Esophageal wall thickening as described which is nonspecific.  An inflammatory process is not excluded.  Correlation with endoscopy is recommended.  2 mm right upper lobe nodule. If the patient is at high risk for bronchogenic carcinoma, follow-up chest CT at 1 year is recommended.  If the patient is at low risk, no follow-up is needed.  This recommendation follows the consensus statement: Guidelines for Management of Small Pulmonary Nodules Detected on CT Scans:  A Statement from the [employer] published in Radiology 2005; 237:395-400.  Thoracic degenerative disc disease as described.   Original Report Authenticated By: AJamas Lav M.D.    Dg Abd Acute W/chest  12/27/2011  *RADIOLOGY REPORT*  Clinical Data: Vomiting, diarrhea, and epigastric pain.  Esophageal dilatation on 12/25/2011.  ACUTE ABDOMEN SERIES (ABDOMEN 2 VIEW & CHEST 1 VIEW)  Comparison: Chest 02/15/2010.  Findings: Normal heart size and pulmonary vascularity. Emphysematous changes scattered fibrosis in the lungs.  No focal airspace consolidation.  No blunting of costophrenic angles.  No pneumothorax or pneumomediastinum.  Mediastinal contours appear intact.  Old right rib fracture.  No significant change since previous study.  Postoperative changes in the mid abdomen.  Scattered gas and stool in the colon.  No small or large bowel distension.  No free air. No abnormal air fluid levels.  Visualized bones appear intact.  IMPRESSION: Emphysematous changes in the lungs.  No evidence of active pulmonary disease.  Nonobstructive bowel gas pattern.   Original Report Authenticated By: Neale Burly, M.D.        Medications: I have reviewed the patient's current medications.  Impression: 1. Chest pain secondary to esophageal palpation, may have a esophageal tear without evidence of perforation. 2. Mildly elevated liver enzymes, improving. Possibly related to shock liver secondary to hypovolemia and dehydration. 3. Severe dehydration, improving. 4. Hyponatremia secondary to dehydration, resolved. 5. History of Crohn's disease with chronic diarrhea.     Plan: 1. Continue with current therapy as per gastroenterology. 2. Hopefully she tolerates a full liquid diet today, she could probably be discharged home tomorrow.     LOS: 1 day   Doree Albee Pager [phone]  12/28/2011, 10:11 AM

## 2011-12-28 NOTE — Progress Notes (Signed)
Subjective; Patient continues to complain of chest pain; however it is no worse than yesterday. She is aching sips of water and ice chips without regurgitation or vomiting. She denies shortness of breath. She has not had a BM since admission. Objective; BP 141/64  Pulse 85  Temp 97.5 F (36.4 C) (Oral)  Resp 20  Ht 5' (1.524 m)  Wt 135 lb 8 oz (61.462 kg)  BMI 26.46 kg/m2  SpO2 97% Patient is alert and in no acute distress. Cardiac exam with regular rhythm normal S1 and S2. No murmur noted. Lungs are clear to auscultation without rales rhonchi or rub. Abdomen is soft with mild tenderness at RLQ but no organomegaly or masses noted. Assessment; #1. Chest pain and odontophagia following esophageal dilation for esophageal stricture on 12/24/2011. She is requiring pain medication every 2-3 hours. Suspect her symptoms made worse by heaving and retching prior to admission. She has no evidence of mediastinitis and her symptoms should gradually resolve. Will try her on a limited full liquid diet and if she does well she could be transitioned to oral pain medication. #2. History of mildly elevated transaminases. AST is now normal at 33 and ALT 38 and borderline alkaline phosphatase of 118. Viral markers for hepatitis B and C. have been negative arcus or autoimmune hepatitis also negative. #3. History of Crohn's disease. Status post right hemicolectomy in 2006. Currently on no therapy. Was complaining of diarrhea on admission but hasn't had any since she was admitted. Stool studies pending. Further evaluation of whether or not her Crohn's is active can be undertaken on an outpatient basis. Recommendations; Limited full liquid diet. Sucralfate liquid 1 g a.c. and each bedtime. If she is able to tolerate full liquids could be switched to oral narcotic therapy but leave it up to Dr. Anastasio Champion. Decrease IV fluids to 75 mL/hr.

## 2011-12-29 DIAGNOSIS — R1310 Dysphagia, unspecified: Secondary | ICD-10-CM

## 2011-12-29 DIAGNOSIS — K219 Gastro-esophageal reflux disease without esophagitis: Secondary | ICD-10-CM

## 2011-12-29 DIAGNOSIS — F411 Generalized anxiety disorder: Secondary | ICD-10-CM

## 2011-12-29 DIAGNOSIS — G8929 Other chronic pain: Secondary | ICD-10-CM

## 2011-12-29 LAB — CBC
Platelets: 191 10*3/uL (ref 150–400)
RBC: 4.13 MIL/uL (ref 3.87–5.11)
RDW: 12.6 % (ref 11.5–15.5)
WBC: 5 10*3/uL (ref 4.0–10.5)

## 2011-12-29 LAB — COMPREHENSIVE METABOLIC PANEL
ALT: 34 U/L (ref 0–35)
AST: 30 U/L (ref 0–37)
Albumin: 3.5 g/dL (ref 3.5–5.2)
Alkaline Phosphatase: 127 U/L — ABNORMAL HIGH (ref 39–117)
CO2: 22 mEq/L (ref 19–32)
Chloride: 95 mEq/L — ABNORMAL LOW (ref 96–112)
GFR calc non Af Amer: >90 mL/min (ref >90)
Potassium: 3.6 mEq/L (ref 3.5–5.1)
Total Bilirubin: 0.7 mg/dL (ref 0.3–1.2)

## 2011-12-29 MED ORDER — OXYCODONE HCL 10 MG PO TABS: 10.0000 mg | ORAL_TABLET | ORAL | Status: AC | PRN

## 2011-12-29 MED ORDER — SUCRALFATE 1 GM/10ML PO SUSP: 1.0000 g | Freq: Three times a day (TID) | ORAL | Status: DC

## 2011-12-29 MED ORDER — METOCLOPRAMIDE HCL 5 MG PO TABS: 5.0000 mg | ORAL_TABLET | Freq: Four times a day (QID) | ORAL | Status: DC | PRN

## 2011-12-29 MED ORDER — PANTOPRAZOLE SODIUM 40 MG PO TBEC: 40.0000 mg | DELAYED_RELEASE_TABLET | Freq: Two times a day (BID) | ORAL | Status: DC

## 2011-12-29 MED ORDER — SODIUM CHLORIDE 0.9 % IJ SOLN
INTRAMUSCULAR | Status: AC
  Filled 2011-12-29: qty 6

## 2011-12-29 NOTE — Progress Notes (Signed)
D/c instructions reviewed with patient.  Verbalized understanding. Pt dc'd to home with family.  Schonewitz, Eulis Canner 12/29/2011

## 2011-12-29 NOTE — Discharge Summary (Signed)
Physician Discharge Summary  [name] [name] PPJ:[id number] DOB: May 15, 1955 DOA: 12/27/2011  PCP: [name], MD  Admit date: 12/27/2011 Discharge date: 12/29/2011  Recommendations for Outpatient Follow-up:  1. Followup with her primary gastroenterologist in approximately one week. 2. Follow with primary care physician.   Discharge Diagnoses:  1. Presumed esophagitis following esophageal dilation for esophageal stricture. No evidence of esophageal perforation. 2. Mildly elevated liver enzymes, improving. 3. Chronic back pain and increasing use of opioids. 4. Hypertension.   Discharge Condition: Stable and improved.  Diet recommendation: Progress to regular diet slowly.  Filed Weights   12/27/11 0927 12/28/11 0517 12/29/11 0446  Weight: 60.328 kg (133 lb) 61.462 kg (135 lb 8 oz) 60.963 kg (134 lb 6.4 oz)    History of present illness:  This 56 year old lady was admitted to the hospital with symptoms of persistent vomiting for 3 days. She also describes some epigastric and chest pain associated with reflux. Please see initial history and physical examination.  Hospital Course:  The patient was admitted and started on intravenous Protonix, intravenous fluids. She was given analgesics as required. She was seen by gastroenterology-CT scan of the chest showed presence of thickening of the distal esophagus and likely her symptoms were related to esophageal ulceration/esophagitis. She was therefore treated separately with Protonix and Carafate. She has done reasonably well with this. During hospitalization, she seemed to be asking for opioids on a very regular basis and I wonder whether she has issues with overuse/abuse of opioids. She has tolerated a liquid diet to a reasonable degree and wishes to go home.  Procedures:  None.  Consultations:  Gastroenterology, Dr. Laural Golden.  Discharge Exam: Filed Vitals:   12/29/11 0446  BP: 122/75  Pulse: 73  Temp: 99.4 F (37.4 C)  Resp: 20     Filed Vitals:   12/28/11 1518 12/28/11 2100 12/29/11 0240 12/29/11 0446  BP: 151/88 145/85 137/86 122/75  Pulse: 65 97 74 73  Temp: 98.4 F (36.9 C) 99 F (37.2 C) 98.8 F (37.1 C) 99.4 F (37.4 C)  TempSrc: Oral Oral Oral Oral  Resp: 20 20 20 20   Height:      Weight:    60.963 kg (134 lb 6.4 oz)  SpO2: 98% 98% 95% 97%    General: She looks systemically well. Cardiovascular: Heart sounds are present and normal. Respiratory: Lung fields are clear. Abdomen soft, subjectively tender. She is alert and orientated.  Discharge Instructions  Discharge Orders    Future Appointments: Provider: Department: Dept Phone: Center:   03/25/2012 10:30 AM Mahala Menghini, PA [facility] [phone] [facility]     Future Orders Please Complete By Expires   Diet - low sodium heart healthy      Increase activity slowly        Medication List  As of 12/29/2011 10:11 AM   STOP taking these medications         HYDROcodone-acetaminophen 5-500 MG per tablet      quinapril-hydrochlorothiazide 10-12.5 MG per tablet         TAKE these medications         amLODipine 5 MG tablet   Commonly known as: NORVASC   Take 1 tablet (5 mg total) by mouth daily.      calcium carbonate 500 MG chewable tablet   Commonly known as: TUMS - dosed in mg elemental calcium   Chew 1 tablet by mouth as needed. For heartburn      diazepam 5 MG tablet  Commonly known as: VALIUM   Take 2.5 mg by mouth 2 (two) times daily as needed. For spasms      gabapentin 300 MG capsule   Commonly known as: NEURONTIN   Take 300 mg by mouth at bedtime.      magic mouthwash Soln   Take 5 mLs by mouth as needed. Swish and Swallow      metoCLOPramide 5 MG tablet   Commonly known as: REGLAN   Take 1 tablet (5 mg total) by mouth 4 (four) times daily as needed.      Oxycodone HCl 10 MG Tabs   Take 1 tablet (10 mg total) by mouth every 3 (three) hours as needed.      pantoprazole 40 MG tablet   Commonly known as:  PROTONIX   Take 1 tablet (40 mg total) by mouth 2 (two) times daily.      PROAIR HFA 108 (90 BASE) MCG/ACT inhaler   Generic drug: albuterol   Inhale 2 puffs into the lungs every 6 (six) hours as needed. For shortness of breath      sucralfate 1 GM/10ML suspension   Commonly known as: CARAFATE   Take 10 mLs (1 g total) by mouth 4 (four) times daily -  with meals and at bedtime.              The results of significant diagnostics from this hospitalization (including imaging, microbiology, ancillary and laboratory) are listed below for reference.    Significant Diagnostic Studies: Ct Chest W Contrast  12/27/2011  *RADIOLOGY REPORT*  Clinical Data: Chest pain  CT CHEST WITH CONTRAST  Technique:  Multidetector CT imaging of the chest was performed following the standard protocol during bolus administration of intravenous contrast.  Contrast: 8m OMNIPAQUE IOHEXOL 300 MG/ML  SOLN  Comparison: None.  Findings: Negative abnormal mediastinal adenopathy.  Negative pericardial effusion.  No pneumothorax.  No pleural effusion.  Left pleural node on image 18.  Similar pleural node on image 29. Dependent atelectasis at the lung bases.  2 mm right upper lobe nodule on image 19.  Scattered peripheral linear opacities likely related to volume loss.  No destructive bone lesion.  T7-T8 central disc protrusion is suspected.  Images of the upper abdomen demonstrate diffuse hepatic steatosis. There is prominence of the adrenal glands without focal mass.  There is wall thickening primarily involving the mid and distal to thirds of the esophagus which is moderate in degree.  There is probably some edema within the neck and lower esophageal wall. Hiatal hernia is noted.  IMPRESSION: Esophageal wall thickening as described which is nonspecific.  An inflammatory process is not excluded.  Correlation with endoscopy is recommended.  2 mm right upper lobe nodule. If the patient is at high risk for bronchogenic carcinoma,  follow-up chest CT at 1 year is recommended.  If the patient is at low risk, no follow-up is needed.  This recommendation follows the consensus statement: Guidelines for Management of Small Pulmonary Nodules Detected on CT Scans:  A Statement from the [employer] published in Radiology 2005; 237:395-400.  Thoracic degenerative disc disease as described.   Original Report Authenticated By: AJamas Lav M.D.    Dg Abd Acute W/chest  12/27/2011  *RADIOLOGY REPORT*  Clinical Data: Vomiting, diarrhea, and epigastric pain.  Esophageal dilatation on 12/25/2011.  ACUTE ABDOMEN SERIES (ABDOMEN 2 VIEW & CHEST 1 VIEW)  Comparison: Chest 02/15/2010.  Findings: Normal heart size and pulmonary vascularity. Emphysematous changes scattered fibrosis in  the lungs.  No focal airspace consolidation.  No blunting of costophrenic angles.  No pneumothorax or pneumomediastinum.  Mediastinal contours appear intact.  Old right rib fracture.  No significant change since previous study.  Postoperative changes in the mid abdomen.  Scattered gas and stool in the colon.  No small or large bowel distension.  No free air. No abnormal air fluid levels.  Visualized bones appear intact.  IMPRESSION: Emphysematous changes in the lungs.  No evidence of active pulmonary disease.  Nonobstructive bowel gas pattern.   Original Report Authenticated By: Neale Burly, M.D.     Microbiology: Recent Results (from the past 240 hour(s))  CLOSTRIDIUM DIFFICILE BY PCR     Status: Normal   Collection Time   12/29/11  8:22 AM      Component Value Range Status Comment   C difficile by pcr NEGATIVE  NEGATIVE Final      Labs: Basic Metabolic Panel:  Lab 15/61/53 0618 12/28/11 0615 12/27/11 0327  NA 132* 141 123*  K 3.6 3.9 3.2*  CL 95* 104 81*  CO2 22 20 20   GLUCOSE 83 50* 96  BUN 3* 6 <3*  CREATININE 0.52 0.52 0.42*  CALCIUM 9.4 8.9 9.3  MG -- -- --  PHOS -- -- --   Liver Function Tests:  Lab 12/29/11 0618 12/28/11 0615  12/27/11 0327 12/24/11 1009  AST 30 33 55* 76*  ALT 34 38* 63* 74*  ALKPHOS 127* 118* 150* 137*  BILITOT 0.7 0.8 0.6 0.8  PROT 6.3 5.9* 7.5 6.1  ALBUMIN 3.5 3.3* 4.2 3.6    Lab 12/27/11 0327  LIPASE 20  AMYLASE --    CBC:  Lab 12/29/11 0618 12/28/11 0615 12/27/11 0327  WBC 5.0 5.1 6.4  NEUTROABS -- -- 4.6  HGB 14.5 13.4 17.1*  HCT 41.3 39.0 45.5  MCV 100.0 101.8* 95.2  PLT 191 190 221     Time coordinating discharge: Greater than 30 minutes  Signed:  GOSRANI,NIMISH C  [facility] 12/29/2011, 10:11 AM

## 2011-12-30 ENCOUNTER — Telehealth: Payer: Self-pay | Admitting: Urgent Care

## 2011-12-30 ENCOUNTER — Telehealth: Payer: Self-pay | Admitting: *Deleted

## 2011-12-30 NOTE — Telephone Encounter (Signed)
Spoke w/ Dr Laural Golden.  Pt was seen inpt at [facility].  Needs FU. Discussed w/ Dr Oneida Alar.  Pt has FU OV November.  Keep appt. Call sooner if needed.

## 2011-12-30 NOTE — Telephone Encounter (Signed)
Ms Gertz called today. She was released from the hospital yesterday and was told to follow up with you in 1 week.  She is having nausea, diarrhea, and vomitting.  Please advise if she should wait until her November appt or if you would like her to be seen sooner with yourself or an extender. Thanks

## 2011-12-30 NOTE — Telephone Encounter (Signed)
PLEASE CALL PT.  SHE DOESN'T NEED TO SEE ME IN ONE WEEK. HER NAUSEA ND VOMITING ARE LIKELY DUE TO REFLUX THAT IS UNCONTROLLED NOW THAT HER ESOPHAGUS HAS BEEN STRETCHED. SHE SHOULD CONTINUE THE PROTONIX BID. DO NOT USE CARAFATE FOR MORE THAN 2 WEEKS. SHE SHOULD FOLLOW A FULL LIQUID DIET FOR 3 DAYS. HER LOOSE STOOSL ARE DUE TO MULTIPLE ISSUES: IBS, NOT HAVING A RIGHT COLON, & MEDICATIONS. THE MAGIC MOUTHWASH CAN MAKE HER LOOSE STOOLS WORSE. SHE SHOULD STOP USING IT FOR 7 DAYS. CALL IN 7 DAYS WITH AN UP DATE ON HER SYMPTOMS.

## 2011-12-31 NOTE — Telephone Encounter (Signed)
Called. VM not set up.

## 2012-01-01 MED ORDER — HYDROCODONE-ACETAMINOPHEN 5-325 MG PO TABS: ORAL_TABLET | ORAL | Status: DC

## 2012-01-01 NOTE — Telephone Encounter (Signed)
Spoke with [facility]. Rx for 5/500 filled 8/20. Other Rx canceled. Pt should pick up new Rx for vicodin 5/325 to start 9/20.

## 2012-01-01 NOTE — Telephone Encounter (Signed)
Called and gave pt the info. She said she was given Metoclopram 5 mg  To take one every 4 hours prn at the hospital. She said it is causing her to hallucinate.  She said she was given oxycodone 10 mg when she left the hospital. She will need her other hydrocodone by the week-end. She said she needs the Hydrocodone 500/325 instead of the 500/500. Please advise! She also wanted to make sure that Dr. Oneida Alar was aware that her esophagus had a tear in in before the procedure was done.

## 2012-01-01 NOTE — Telephone Encounter (Signed)
Called and informed pt. Per Dr. Oneida Alar, pt should get the prescriptions not filled from [facility] and return and she will rewrite.

## 2012-01-01 NOTE — Telephone Encounter (Signed)
PLEASE CALL PT. SHE WILL NEED TO RETURN RX GIVEN AT LAST VISIT AND I WILL RE-WRITE FOR VICODIN 5/325.

## 2012-01-01 NOTE — Addendum Note (Signed)
Addended by: Danie Binder on: 01/01/2012 04:42 PM   Modules accepted: Orders

## 2012-01-01 NOTE — Telephone Encounter (Signed)
Received a phone call from Aaron Edelman, pharmacist at [facility]. He said that pt had called asking questions about taking the hydrocodone 5/500 and saying she was going to need the 5/325. I told him that Dr. Oneida Alar had told pt that she should return the previous prescriptions from [facility] and she would rewrite it for 5/325. Per Aaron Edelman, he could cancel out the prescriptions per Dr. Oneida Alar request. His concern is that pt recently got a refill for the 5/500 and she also has Oxcodone 66m. Please advise BAaron Edelman/ pt.

## 2012-01-02 NOTE — Telephone Encounter (Signed)
Pt returned call and was informed. She has prescriptions here written by Dr. Oneida Alar for Vicodin 5/325 # 19 and take one tid with no refills. She has four prescriptions of the same for 9/20, 10/20, 11/20, 12/20. Pt is aware the prescriptions will be at the front for pick up.   She also asked what she said about the Metoclopram causing her to hallucinate. I told her Dr. Oneida Alar is out a few days. If she feels she needs that changed right away to contact her PCP.

## 2012-01-02 NOTE — Telephone Encounter (Signed)
LMOM for pt to call. 

## 2012-01-02 NOTE — Telephone Encounter (Signed)
REVIEWED.  

## 2012-01-08 DIAGNOSIS — M999 Biomechanical lesion, unspecified: Secondary | ICD-10-CM | POA: Diagnosis not present

## 2012-01-08 DIAGNOSIS — M5430 Sciatica, unspecified side: Secondary | ICD-10-CM | POA: Diagnosis not present

## 2012-01-08 DIAGNOSIS — M545 Low back pain: Secondary | ICD-10-CM | POA: Diagnosis not present

## 2012-01-10 DIAGNOSIS — M5430 Sciatica, unspecified side: Secondary | ICD-10-CM | POA: Diagnosis not present

## 2012-01-10 DIAGNOSIS — M545 Low back pain: Secondary | ICD-10-CM | POA: Diagnosis not present

## 2012-01-10 DIAGNOSIS — M999 Biomechanical lesion, unspecified: Secondary | ICD-10-CM | POA: Diagnosis not present

## 2012-01-13 DIAGNOSIS — M5430 Sciatica, unspecified side: Secondary | ICD-10-CM | POA: Diagnosis not present

## 2012-01-13 DIAGNOSIS — M545 Low back pain: Secondary | ICD-10-CM | POA: Diagnosis not present

## 2012-01-13 DIAGNOSIS — M999 Biomechanical lesion, unspecified: Secondary | ICD-10-CM | POA: Diagnosis not present

## 2012-01-17 DIAGNOSIS — M999 Biomechanical lesion, unspecified: Secondary | ICD-10-CM | POA: Diagnosis not present

## 2012-01-17 DIAGNOSIS — M5430 Sciatica, unspecified side: Secondary | ICD-10-CM | POA: Diagnosis not present

## 2012-01-17 DIAGNOSIS — M545 Low back pain: Secondary | ICD-10-CM | POA: Diagnosis not present

## 2012-01-20 DIAGNOSIS — M545 Low back pain: Secondary | ICD-10-CM | POA: Diagnosis not present

## 2012-01-20 DIAGNOSIS — M5430 Sciatica, unspecified side: Secondary | ICD-10-CM | POA: Diagnosis not present

## 2012-01-20 DIAGNOSIS — M999 Biomechanical lesion, unspecified: Secondary | ICD-10-CM | POA: Diagnosis not present

## 2012-01-21 ENCOUNTER — Ambulatory Visit (HOSPITAL_COMMUNITY)
Admission: RE | Admit: 2012-01-21 | Discharge: 2012-01-21 | Disposition: A | Discharge status: Home / Self Care | Payer: Medicare Other | Source: Ambulatory Visit | Attending: Family Medicine | Admitting: Family Medicine

## 2012-01-21 ENCOUNTER — Ambulatory Visit (INDEPENDENT_AMBULATORY_CARE_PROVIDER_SITE_OTHER): Payer: Medicare Other | Admitting: Family Medicine

## 2012-01-21 ENCOUNTER — Encounter: Payer: Self-pay | Admitting: Family Medicine

## 2012-01-21 VITALS — BP 120/90 | HR 95 | Resp 16 | Ht 60.0 in | Wt 121.0 lb

## 2012-01-21 DIAGNOSIS — M5030 Other cervical disc degeneration, unspecified cervical region: Secondary | ICD-10-CM | POA: Diagnosis not present

## 2012-01-21 DIAGNOSIS — M79609 Pain in unspecified limb: Secondary | ICD-10-CM | POA: Insufficient documentation

## 2012-01-21 DIAGNOSIS — M542 Cervicalgia: Secondary | ICD-10-CM | POA: Insufficient documentation

## 2012-01-21 DIAGNOSIS — M7989 Other specified soft tissue disorders: Secondary | ICD-10-CM

## 2012-01-21 DIAGNOSIS — R42 Dizziness and giddiness: Secondary | ICD-10-CM | POA: Diagnosis not present

## 2012-01-21 DIAGNOSIS — J441 Chronic obstructive pulmonary disease with (acute) exacerbation: Secondary | ICD-10-CM

## 2012-01-21 MED ORDER — DOXYCYCLINE HYCLATE 100 MG PO TABS: 100.0000 mg | ORAL_TABLET | Freq: Two times a day (BID) | ORAL | Status: DC

## 2012-01-21 MED ORDER — METHYLPREDNISOLONE 4 MG PO KIT: PACK | ORAL | Status: DC

## 2012-01-21 MED ORDER — MECLIZINE HCL 12.5 MG PO TABS: 12.5000 mg | ORAL_TABLET | Freq: Two times a day (BID) | ORAL | Status: DC

## 2012-01-21 NOTE — Patient Instructions (Addendum)
Get the xrays done Use the dizzy pill as needed  Start the antibiotics and prednisone  F/U 4 weeks

## 2012-01-21 NOTE — Progress Notes (Signed)
  Subjective:    Patient ID: [name] [name], female    DOB: 04-02-1956, 56 y.o.   MRN: [id number]  HPI Patient here to followup for chronic medical problems. She has no loss of followup for the past 8 months. She was hospitalized last month with esophagitis, dehydration. She was started on and however this was stopped secondary to side effects.   Today she is complaining of right hand swelling and to wrist. This is been present for the past 3 days. She denies any injury to hand, insect bite or previous history of such swelling. She does admit to bilateral joint pain in the hands. She's also noted she has neck pain which radiates down the right arm. She has been seen by chiropractor in the past is no recent imaging. She's had dizzy spells on and off which she feels like things are spinning when she positions her head in a certain she's also had cough with production for the past couple of weeks which has not improved.   Review of Systems  GEN- denies fatigue, fever, weight loss,weakness, recent illness HEENT- denies eye drainage, change in vision, nasal discharge, CVS- denies chest pain, palpitations RESP- + SOB,+ cough, wheeze ABD- denies N/V, change in stools, abd pain GU- denies dysuria, hematuria, dribbling, incontinence MSK- + joint pain, muscle aches, injury Neuro- denies headache, +dizziness, syncope, seizure activity      Objective:   Physical Exam GEN- NAD, alert and oriented x3 HEENT- PERRL, EOMI, non injected sclera, pink conjunctiva, MMM, oropharynx clear Neck- Supple, decreased ROM  CVS- RRR, no murmur RESP-course BS and mild rhonchi right side, no wheeze, normal WOB,  EXT- No edema LE, + swelling diffusley Right hand to wrist, TTP joints, decreased ROM hand, decreased grip, unable to extend completley  Pulses- Radial, DP- 2+        Assessment & Plan:

## 2012-01-22 DIAGNOSIS — R42 Dizziness and giddiness: Secondary | ICD-10-CM | POA: Insufficient documentation

## 2012-01-22 DIAGNOSIS — J441 Chronic obstructive pulmonary disease with (acute) exacerbation: Secondary | ICD-10-CM | POA: Insufficient documentation

## 2012-01-22 DIAGNOSIS — M542 Cervicalgia: Secondary | ICD-10-CM | POA: Insufficient documentation

## 2012-01-22 DIAGNOSIS — M7989 Other specified soft tissue disorders: Secondary | ICD-10-CM | POA: Insufficient documentation

## 2012-01-22 NOTE — Assessment & Plan Note (Signed)
Will treat with antibiotics, prednisone, continue inhalers.

## 2012-01-22 NOTE — Assessment & Plan Note (Signed)
Unilateral hand swelling, though history of joint pain bilat in hands. No injury. No signs of upper ext DVT based on localized swelling, no signs of infection. Obtain Xray hand, prednisone per above, elevate hand, will f/u may need rheumatology referral.

## 2012-01-22 NOTE — Assessment & Plan Note (Signed)
Positional vertigo, normal exam, trial of meclizine

## 2012-01-22 NOTE — Assessment & Plan Note (Signed)
likley DDD or OA, check neck film

## 2012-01-24 ENCOUNTER — Telehealth: Payer: Self-pay | Admitting: Family Medicine

## 2012-01-24 DIAGNOSIS — M545 Low back pain: Secondary | ICD-10-CM | POA: Diagnosis not present

## 2012-01-24 DIAGNOSIS — M999 Biomechanical lesion, unspecified: Secondary | ICD-10-CM | POA: Diagnosis not present

## 2012-01-24 DIAGNOSIS — M5430 Sciatica, unspecified side: Secondary | ICD-10-CM | POA: Diagnosis not present

## 2012-01-24 NOTE — Telephone Encounter (Signed)
I spoke with rheumatology, consider MRI hand if swelling does not improve to see if it is joint or tendon effusion Spoke with pt she now has some mild swelling in left hand as well, she had to cut her rings off I reviewed D/C summary from hospital, taken off BP meds with diuretic ,she states BP has been very high at home and in chiropractic office Will restart ACEI/HCTZ, unsure why discontinued however pt was dehydrated.  No swelling on upper arms, good pulse, no skin discoloration Recheck Monday

## 2012-01-27 ENCOUNTER — Ambulatory Visit (INDEPENDENT_AMBULATORY_CARE_PROVIDER_SITE_OTHER): Payer: Medicare Other | Admitting: Family Medicine

## 2012-01-27 ENCOUNTER — Encounter: Payer: Self-pay | Admitting: Family Medicine

## 2012-01-27 VITALS — BP 126/74 | HR 106 | Resp 18 | Ht 60.0 in | Wt 120.0 lb

## 2012-01-27 DIAGNOSIS — M25449 Effusion, unspecified hand: Secondary | ICD-10-CM | POA: Diagnosis not present

## 2012-01-27 DIAGNOSIS — J441 Chronic obstructive pulmonary disease with (acute) exacerbation: Secondary | ICD-10-CM

## 2012-01-27 DIAGNOSIS — M7989 Other specified soft tissue disorders: Secondary | ICD-10-CM

## 2012-01-27 DIAGNOSIS — M6740 Ganglion, unspecified site: Secondary | ICD-10-CM | POA: Diagnosis not present

## 2012-01-27 DIAGNOSIS — M542 Cervicalgia: Secondary | ICD-10-CM

## 2012-01-27 DIAGNOSIS — IMO0002 Reserved for concepts with insufficient information to code with codable children: Secondary | ICD-10-CM

## 2012-01-27 DIAGNOSIS — M5030 Other cervical disc degeneration, unspecified cervical region: Secondary | ICD-10-CM

## 2012-01-27 DIAGNOSIS — S62123A Displaced fracture of lunate [semilunar], unspecified wrist, initial encounter for closed fracture: Secondary | ICD-10-CM

## 2012-01-27 MED ORDER — QUINAPRIL-HYDROCHLOROTHIAZIDE 10-12.5 MG PO TABS: 1.0000 | ORAL_TABLET | Freq: Every day | ORAL | Status: DC

## 2012-01-27 MED ORDER — AMLODIPINE BESYLATE 5 MG PO TABS: 5.0000 mg | ORAL_TABLET | Freq: Every day | ORAL | Status: DC

## 2012-01-27 NOTE — Patient Instructions (Addendum)
For the MRI take 1 tablet of Valium  Blood pressure medication refilled  Restart the gabapentin at bedtime  Keep previous f/u appt

## 2012-01-28 NOTE — Progress Notes (Signed)
  Subjective:    Patient ID: [name] [name], female    DOB: 1955-10-29, 56 y.o.   MRN: [id number]  HPI Pt here to f/u hand swelling and COPD exacerbation. Breathing is much better has a few more antibiotics left.  Hand swelling has improved, she restarted lisinopril- HCTZ, continues to have a lot of pain and swelling not just at joints, but sharp pains radiate from hand up to neck and vice versa, she is unable to use right hand as usual for driving, cooking bathing without significant pain    Review of Systems   GEN- denies fatigue, fever, weight loss,weakness, recent illness HEENT- denies eye drainage, change in vision, nasal discharge, CVS- denies chest pain, palpitations RESP- denies SOB,+ cough, wheeze ABD- denies N/V, change in stools, abd pain GU- denies dysuria, hematuria, dribbling, incontinence MSK- + joint pain, muscle aches, injury Neuro- denies headache, dizziness, syncope, seizure activity     Objective:   Physical Exam GEN- NAD, alert and oriented x3 HEENT- PERRL, EOMI, non injected sclera, pink conjunctiva, MMM, oropharynx clear Neck- Supple, decreased ROM , pain with movement,  CVS- RRR, no murmur RESP-no wheeze, CTAB EXT- , + swelling at MIP and DIP joints, TTP decreased ROM hand, decreased grip, unable to extend completley  Pulses- Radial 2+        Assessment & Plan:

## 2012-01-30 ENCOUNTER — Telehealth: Payer: Self-pay | Admitting: Family Medicine

## 2012-01-30 ENCOUNTER — Ambulatory Visit (HOSPITAL_COMMUNITY)
Admission: RE | Admit: 2012-01-30 | Discharge: 2012-01-30 | Disposition: A | Discharge status: Home / Self Care | Payer: Medicare Other | Source: Ambulatory Visit | Attending: Family Medicine | Admitting: Family Medicine

## 2012-01-30 DIAGNOSIS — M4802 Spinal stenosis, cervical region: Secondary | ICD-10-CM | POA: Diagnosis not present

## 2012-01-30 DIAGNOSIS — M542 Cervicalgia: Secondary | ICD-10-CM

## 2012-01-30 DIAGNOSIS — M79609 Pain in unspecified limb: Secondary | ICD-10-CM | POA: Diagnosis not present

## 2012-01-30 DIAGNOSIS — R937 Abnormal findings on diagnostic imaging of other parts of musculoskeletal system: Secondary | ICD-10-CM | POA: Diagnosis not present

## 2012-01-30 DIAGNOSIS — M7989 Other specified soft tissue disorders: Secondary | ICD-10-CM | POA: Diagnosis not present

## 2012-01-30 DIAGNOSIS — M65839 Other synovitis and tenosynovitis, unspecified forearm: Secondary | ICD-10-CM | POA: Insufficient documentation

## 2012-01-30 DIAGNOSIS — S62123A Displaced fracture of lunate [semilunar], unspecified wrist, initial encounter for closed fracture: Secondary | ICD-10-CM

## 2012-01-30 DIAGNOSIS — M6740 Ganglion, unspecified site: Secondary | ICD-10-CM

## 2012-01-30 DIAGNOSIS — M19049 Primary osteoarthritis, unspecified hand: Secondary | ICD-10-CM | POA: Diagnosis not present

## 2012-01-30 DIAGNOSIS — IMO0002 Reserved for concepts with insufficient information to code with codable children: Secondary | ICD-10-CM

## 2012-01-30 DIAGNOSIS — M659 Synovitis and tenosynovitis, unspecified: Secondary | ICD-10-CM

## 2012-01-30 MED ORDER — GADOBENATE DIMEGLUMINE 529 MG/ML IV SOLN
10.0000 mL | Freq: Once | INTRAVENOUS | Status: AC | PRN
  Administered 2012-01-30: 10 mL via INTRAVENOUS

## 2012-01-30 MED ORDER — OXYCODONE-ACETAMINOPHEN 5-325 MG PO TABS: 1.0000 | ORAL_TABLET | Freq: Four times a day (QID) | ORAL | Status: DC | PRN

## 2012-01-30 NOTE — Telephone Encounter (Signed)
Pt given results, needs hand referral Will give short term percocet as needing increased pain meds

## 2012-01-31 DIAGNOSIS — M6740 Ganglion, unspecified site: Secondary | ICD-10-CM | POA: Insufficient documentation

## 2012-01-31 DIAGNOSIS — S62123A Displaced fracture of lunate [semilunar], unspecified wrist, initial encounter for closed fracture: Secondary | ICD-10-CM | POA: Insufficient documentation

## 2012-01-31 NOTE — Assessment & Plan Note (Signed)
Will refer her to hand surgery for evaluation. Even though she has a small fracture her MRI shows evidence of tenosynovitis as well as degenerative changes with a cyst along her middle and ring finger on the right hand. Her swelling has improved however she still has considerable decreased range of motion and swelling at her joints.

## 2012-01-31 NOTE — Assessment & Plan Note (Signed)
Breathing much improved lung exam is improved.

## 2012-01-31 NOTE — Assessment & Plan Note (Signed)
MRI was obtained secondary to worsening symptoms and possible contribute and to her hand pain and swelling. Does not appear to be any vascular compromise. No evidence of upper extremity DVT. MRI showed bulging disc with degenerative changes and pinched nerve. After she is seen by a hand surgeon we will then discuss her being evaluated by neurosurgery again

## 2012-01-31 NOTE — Assessment & Plan Note (Signed)
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

## 2012-02-03 ENCOUNTER — Telehealth: Payer: Self-pay

## 2012-02-03 NOTE — Telephone Encounter (Signed)
REVIEWED.  

## 2012-02-03 NOTE — Telephone Encounter (Signed)
Pt called to let Dr. Oneida Alar know that she had an MRI of her hand and neck and she has a small fracture in her hand. She said that Dr. Buelah Manis gave her Percocet #40 to take one every 6 hours prn. She said she is not taking the Vicodin from Dr. Oneida Alar, now, while she is taking the Percocet. She just wanted Dr. Oneida Alar to know.

## 2012-02-05 DIAGNOSIS — M5430 Sciatica, unspecified side: Secondary | ICD-10-CM | POA: Diagnosis not present

## 2012-02-05 DIAGNOSIS — M999 Biomechanical lesion, unspecified: Secondary | ICD-10-CM | POA: Diagnosis not present

## 2012-02-05 DIAGNOSIS — M545 Low back pain: Secondary | ICD-10-CM | POA: Diagnosis not present

## 2012-02-12 ENCOUNTER — Telehealth: Payer: Self-pay

## 2012-02-12 NOTE — Telephone Encounter (Signed)
Pt aware.

## 2012-02-12 NOTE — Telephone Encounter (Signed)
She can go back to taking her vicodin given by Dr. Oneida Alar at this time.

## 2012-02-12 NOTE — Telephone Encounter (Signed)
Has appt to see hand specialist today but they had to cancel. Going to try to get her in this Friday but she is out of her pain meds. Their office told her to check with her PCP to see if she could get another refill on her pain meds. She only has 1 left. Said she needed to know something asap

## 2012-02-18 ENCOUNTER — Ambulatory Visit (INDEPENDENT_AMBULATORY_CARE_PROVIDER_SITE_OTHER): Payer: Medicare Other | Admitting: Family Medicine

## 2012-02-18 ENCOUNTER — Encounter: Payer: Self-pay | Admitting: Family Medicine

## 2012-02-18 VITALS — BP 122/80 | HR 84 | Resp 15 | Ht 61.0 in | Wt 119.1 lb

## 2012-02-18 DIAGNOSIS — Z13820 Encounter for screening for osteoporosis: Secondary | ICD-10-CM

## 2012-02-18 DIAGNOSIS — F17200 Nicotine dependence, unspecified, uncomplicated: Secondary | ICD-10-CM

## 2012-02-18 DIAGNOSIS — J449 Chronic obstructive pulmonary disease, unspecified: Secondary | ICD-10-CM

## 2012-02-18 DIAGNOSIS — I10 Essential (primary) hypertension: Secondary | ICD-10-CM | POA: Diagnosis not present

## 2012-02-18 DIAGNOSIS — Z1239 Encounter for other screening for malignant neoplasm of breast: Secondary | ICD-10-CM

## 2012-02-18 DIAGNOSIS — E785 Hyperlipidemia, unspecified: Secondary | ICD-10-CM

## 2012-02-18 DIAGNOSIS — S62123A Displaced fracture of lunate [semilunar], unspecified wrist, initial encounter for closed fracture: Secondary | ICD-10-CM

## 2012-02-18 DIAGNOSIS — Z720 Tobacco use: Secondary | ICD-10-CM

## 2012-02-18 NOTE — Patient Instructions (Addendum)
Schedule mammogram before next visit/ and Bone density  Get flu shot at pharmacy  Get the labs done in the next 2 weeks- fasting F/U hand surgeon tomorrow F/U 3 months - Physical with PAP Smear

## 2012-02-19 DIAGNOSIS — M25539 Pain in unspecified wrist: Secondary | ICD-10-CM | POA: Diagnosis not present

## 2012-02-20 ENCOUNTER — Telehealth: Payer: Self-pay | Admitting: Family Medicine

## 2012-02-20 ENCOUNTER — Other Ambulatory Visit: Payer: Self-pay | Admitting: Orthopedic Surgery

## 2012-02-20 DIAGNOSIS — M25539 Pain in unspecified wrist: Secondary | ICD-10-CM

## 2012-02-20 NOTE — Assessment & Plan Note (Signed)
Check FLP

## 2012-02-20 NOTE — Progress Notes (Signed)
  Subjective:    Patient ID: [name] [name], female    DOB: 1955/05/24, 56 y.o.   MRN: [id number]  HPI Pt here to f/u chronic medical problems and hand pain- has appt with hand surgeon tomorrow for lunate fracture and other problems noted. COPD- breathing has improved, completed antibiotics Due for mammogram Due for PAP Smear Declined flu shot Due for lipids   Review of Systems  GEN- denies fatigue, fever, weight loss,weakness, recent illness HEENT- denies eye drainage, change in vision, nasal discharge, CVS- denies chest pain, palpitations RESP- denies SOB, cough, wheeze ABD- denies N/V, change in stools, abd pain GU- denies dysuria, hematuria, dribbling, incontinence MSK- + joint pain, muscle aches, injury Neuro- denies headache, dizziness, syncope, seizure activity      Objective:   Physical Exam GEN- NAD, alert and oriented x3 HEENT- PERRL, EOMI, non injected sclera, pink conjunctiva, MMM, oropharynx clear Neck- Supple, decreased ROM  CVS- RRR, no murmur RESP-CTAB EXT- No edema MSK- decreased swelling in right hand/fingers, wearing brace  Pulse - radial 2+     Assessment & Plan:

## 2012-02-20 NOTE — Assessment & Plan Note (Signed)
I am not going to give any further pain meds until she is evaluated by surgeon, on chronic vicodin prescribed by gastroenterologist

## 2012-02-20 NOTE — Assessment & Plan Note (Signed)
counseled on cessation

## 2012-02-20 NOTE — Assessment & Plan Note (Signed)
Back to baseline, continue inhalers

## 2012-02-20 NOTE — Assessment & Plan Note (Signed)
Back on medications, doing well

## 2012-02-21 MED ORDER — OXYCODONE-ACETAMINOPHEN 5-325 MG PO TABS: 1.0000 | ORAL_TABLET | Freq: Four times a day (QID) | ORAL | Status: DC | PRN

## 2012-02-21 NOTE — Telephone Encounter (Signed)
Will give only 1 more refill on percocet She needs to pick up

## 2012-02-21 NOTE — Telephone Encounter (Signed)
Patient aware.

## 2012-02-24 ENCOUNTER — Telehealth: Payer: Self-pay

## 2012-02-24 NOTE — Telephone Encounter (Signed)
Received a fax with the prescriptions for Vicodin, stating they could not be filled. I called and spoke to [place] at [facility]. He said that the date had to reflect the date the prescription was written. If you wanted to write for future, that date would have to be on the face of the prescription. ( they were for 02/23/2012, 03/25/2012, and 04/24/2012. He said that pt is wanting her med, and he cannot fill the ones on file.

## 2012-02-26 NOTE — Telephone Encounter (Signed)
SPOKE TO BRIAN. HE EXPLAINED RX FOR FUTURE REFILLS NEED DATE ACTUALLY WRITTEN AND THEN SPECIFY TO FILL ON OR AFTER A CERTAIN DATE.  PT RECEIVED PERCOCET FROM DR. Buelah Manis #40. PT WILL NEED TO PICK UP RX EVERY MONTH. RX FOR VICODIN LEFT AT FRONT DESK: DATE: 10/23, FILL AFTER 03/06/2012. SUBSEQUENT RX WILL BE AFTER THE FIRST OF THE MONTH.

## 2012-02-26 NOTE — Telephone Encounter (Signed)
Called and informed pt Rx can be picked up, but not filled til after 03/06/2012.

## 2012-02-27 ENCOUNTER — Ambulatory Visit
Admission: RE | Admit: 2012-02-27 | Discharge: 2012-02-27 | Disposition: A | Discharge status: Home / Self Care | Payer: Medicare Other | Source: Ambulatory Visit | Attending: Orthopedic Surgery | Admitting: Orthopedic Surgery

## 2012-02-27 DIAGNOSIS — M25539 Pain in unspecified wrist: Secondary | ICD-10-CM | POA: Diagnosis not present

## 2012-02-27 MED ORDER — GADOBENATE DIMEGLUMINE 529 MG/ML IV SOLN
10.0000 mL | Freq: Once | INTRAVENOUS | Status: AC | PRN
  Administered 2012-02-27: 10 mL via INTRAVENOUS

## 2012-03-16 ENCOUNTER — Telehealth: Payer: Self-pay | Admitting: Family Medicine

## 2012-03-17 MED ORDER — OXYCODONE-ACETAMINOPHEN 5-325 MG PO TABS: 1.0000 | ORAL_TABLET | Freq: Four times a day (QID) | ORAL | Status: DC | PRN

## 2012-03-17 NOTE — Telephone Encounter (Signed)
Patient is asking for results of MRI and she is also for a change in pain medicine.  She stated that she was told by specialist to consult primary physician for pain medicine.  Please advise.

## 2012-03-17 NOTE — Telephone Encounter (Signed)
Please let pt know she needs to contact the surgeons office I referred her to for the MRI results as I did not order this test.   I will give her only 1 more refill on Percocet. She has to continue using the vicodin, if she has surgery the surgeon will provide medication as needed.

## 2012-03-17 NOTE — Telephone Encounter (Signed)
Patient is aware.  May send friend to collect rx is aware that ID is needed.

## 2012-03-18 ENCOUNTER — Telehealth: Payer: Self-pay | Admitting: *Deleted

## 2012-03-18 NOTE — Telephone Encounter (Signed)
Pt called to let Dr. Oneida Alar know the problems she has been having with her hand and elbow. She had to take an extra vicodin at night ( that Dr. Oneida Alar had given her) because the pain was so bad in her hand and elbow. She has seen the orthopedic doctor and has an appt to follow up on 04/01/2012. She said the orthopedic doctor told her she needs to inform PCP if her pain meds were not helping. She called and talked to Dr. Buelah Manis who prescribed Percocet. [facility] did not have the Percocet and she had to get that at [facility]. ( But she will continue her meds at [facility]).

## 2012-03-18 NOTE — Telephone Encounter (Signed)
[name] [name] called this am to let you know about her recent medication changes. Please call her back. Thanks.

## 2012-03-19 ENCOUNTER — Telehealth: Payer: Self-pay | Admitting: Family Medicine

## 2012-03-19 ENCOUNTER — Other Ambulatory Visit: Payer: Self-pay

## 2012-03-19 MED ORDER — DIAZEPAM 5 MG PO TABS: 2.5000 mg | ORAL_TABLET | Freq: Two times a day (BID) | ORAL | Status: DC | PRN

## 2012-03-19 NOTE — Telephone Encounter (Signed)
I spoke with pt Gastroenterologist Dr. Barney Drain, she has had multiple prescriptions for narcotic pain medications by myself another surgeon and Dr. Oneida Alar since August. She does require pain meds for chronic pain, functional abdominal pain. Discussed her recent issues with her hand. We have decided that I will take over pain contract and chronic pain meds , hopefully I can monitor her use  and avoid multiple prescriptions, if needed she will be referred to a pain clinic Pt will be notified.  She just received #90 script for Vicodin on 10/20 And Percocet #40 on 11/12

## 2012-03-19 NOTE — Telephone Encounter (Signed)
Med faxed to [facility]

## 2012-03-25 ENCOUNTER — Ambulatory Visit: Payer: Medicare Other | Admitting: Gastroenterology

## 2012-03-25 NOTE — Telephone Encounter (Signed)
LMOM to call.

## 2012-03-25 NOTE — Telephone Encounter (Signed)
Sorry, think I forgot to route to Dr. Luz Lex. ( Routing to [place] also, since pt has appt with her this AM)

## 2012-03-25 NOTE — Telephone Encounter (Addendum)
PLEASE CALL PT.  I SPOKE WITH [name] [name] AND SHE AGREES TO MANAGE HER NARCOTIC PAIN MEDS. WE AGREE THAT IT IS IN [name] [name]'S BEST INTEREST IF [name] [name] IS THE ONLY ONE TO WRITE FOR HER NARCOTICS. SHE SHOULD SPEAK WITH [name] [name] REGARDING HER NEED FOR NARCOTIC PAIN MEDS.  Reviewed narcotic MEDS GIVENSINCE JUL 2013 (TOTAL #524):  JUL 23: VICODIN 10/325   #100 [name] JENKINS (JJ) AUG 12: VICODIN 10/325   #41 JJ AUG 20: VICODIN 5/500:   #90 [name] [name](SF) AUG 25: OXYCODONE 10 MG:  #30 [name] [name] SEP 20: VICODIN 5/325  #93 SF SEP 27: OXY 5/325   #40  [name] [name] (KD) OCT 18 : OXY 5/325   #40 KD NOV 11: VICODIN 5/325  #90 SF

## 2012-03-25 NOTE — Telephone Encounter (Signed)
Patient cancelled her 10:30am appt for today at 9:00 this morning.

## 2012-03-25 NOTE — Telephone Encounter (Signed)
Pt returned call and was informed.

## 2012-04-01 DIAGNOSIS — M25539 Pain in unspecified wrist: Secondary | ICD-10-CM | POA: Diagnosis not present

## 2012-04-01 DIAGNOSIS — M6740 Ganglion, unspecified site: Secondary | ICD-10-CM | POA: Diagnosis not present

## 2012-04-01 DIAGNOSIS — M65849 Other synovitis and tenosynovitis, unspecified hand: Secondary | ICD-10-CM | POA: Diagnosis not present

## 2012-04-01 DIAGNOSIS — M19049 Primary osteoarthritis, unspecified hand: Secondary | ICD-10-CM | POA: Diagnosis not present

## 2012-04-06 ENCOUNTER — Telehealth: Payer: Self-pay | Admitting: Family Medicine

## 2012-04-06 ENCOUNTER — Encounter: Payer: Self-pay | Admitting: Gastroenterology

## 2012-04-06 MED ORDER — OXYCODONE-ACETAMINOPHEN 5-325 MG PO TABS: 1.0000 | ORAL_TABLET | Freq: Two times a day (BID) | ORAL | Status: DC | PRN

## 2012-04-06 NOTE — Telephone Encounter (Signed)
I will not change dose, she will continue Percocet 5/ 359m Given 60 tablets for the month, this is stronger than the vicodin she was previously given She needs a pain contract when she comes in if not signed already

## 2012-04-06 NOTE — Telephone Encounter (Signed)
Please advise 

## 2012-04-07 NOTE — Telephone Encounter (Signed)
Patient aware.

## 2012-04-09 ENCOUNTER — Telehealth: Payer: Self-pay | Admitting: Family Medicine

## 2012-04-09 NOTE — Telephone Encounter (Signed)
Has tried cream, ice and everything nothing is working also tried braces not abusing it

## 2012-04-09 NOTE — Telephone Encounter (Signed)
Dr. Buelah Manis specified that the medicine must last for 30 days.

## 2012-04-30 ENCOUNTER — Telehealth: Payer: Self-pay | Admitting: Family Medicine

## 2012-04-30 NOTE — Telephone Encounter (Signed)
Not due until Jan 2. Can collect Dec 30

## 2012-05-01 ENCOUNTER — Other Ambulatory Visit: Payer: Self-pay

## 2012-05-01 ENCOUNTER — Telehealth: Payer: Self-pay | Admitting: Family Medicine

## 2012-05-01 MED ORDER — OXYCODONE-ACETAMINOPHEN 5-325 MG PO TABS: 1.0000 | ORAL_TABLET | Freq: Two times a day (BID) | ORAL | Status: DC | PRN

## 2012-05-01 NOTE — Telephone Encounter (Signed)
Patient aware that she can collect rx next week.

## 2012-05-01 NOTE — Telephone Encounter (Signed)
Noted and printed.  Patient will not be able to fill until due however.

## 2012-05-04 ENCOUNTER — Telehealth: Payer: Self-pay | Admitting: Family Medicine

## 2012-05-04 NOTE — Telephone Encounter (Signed)
Explained to patient in great detail that rx could be collected today but the pharmacy will not fill until at the earliest one day before it is due.  Patient states that she filled rx on the 2nd of this month.

## 2012-05-26 ENCOUNTER — Other Ambulatory Visit: Payer: Self-pay

## 2012-05-26 NOTE — Telephone Encounter (Signed)
Pt needs OV prior to further RFs

## 2012-05-28 ENCOUNTER — Ambulatory Visit (HOSPITAL_COMMUNITY)
Admission: RE | Admit: 2012-05-28 | Discharge: 2012-05-28 | Disposition: A | Discharge status: Home / Self Care | Payer: Medicare Other | Source: Ambulatory Visit | Attending: Family Medicine | Admitting: Family Medicine

## 2012-05-28 DIAGNOSIS — Z1231 Encounter for screening mammogram for malignant neoplasm of breast: Secondary | ICD-10-CM | POA: Diagnosis not present

## 2012-05-28 DIAGNOSIS — E785 Hyperlipidemia, unspecified: Secondary | ICD-10-CM | POA: Diagnosis not present

## 2012-05-28 DIAGNOSIS — Z13820 Encounter for screening for osteoporosis: Secondary | ICD-10-CM | POA: Insufficient documentation

## 2012-05-28 DIAGNOSIS — Z780 Asymptomatic menopausal state: Secondary | ICD-10-CM | POA: Diagnosis not present

## 2012-05-28 DIAGNOSIS — Z1239 Encounter for other screening for malignant neoplasm of breast: Secondary | ICD-10-CM

## 2012-05-28 DIAGNOSIS — M810 Age-related osteoporosis without current pathological fracture: Secondary | ICD-10-CM | POA: Insufficient documentation

## 2012-05-28 LAB — LIPID PANEL
LDL Cholesterol: 93 mg/dL (ref 0–99)
Total CHOL/HDL Ratio: 1.9 Ratio
VLDL: 18 mg/dL (ref 0–40)

## 2012-05-28 NOTE — Telephone Encounter (Signed)
Pt has an appointment on 06-11-12 with KJ

## 2012-05-29 ENCOUNTER — Other Ambulatory Visit: Payer: Self-pay

## 2012-05-29 MED ORDER — QUINAPRIL-HYDROCHLOROTHIAZIDE 10-12.5 MG PO TABS: 1.0000 | ORAL_TABLET | Freq: Every day | ORAL | Status: DC

## 2012-05-29 MED ORDER — AMLODIPINE BESYLATE 5 MG PO TABS: 5.0000 mg | ORAL_TABLET | Freq: Every day | ORAL | Status: DC

## 2012-06-01 ENCOUNTER — Telehealth: Payer: Self-pay | Admitting: Family Medicine

## 2012-06-01 ENCOUNTER — Other Ambulatory Visit: Payer: Self-pay

## 2012-06-01 DIAGNOSIS — M545 Low back pain: Secondary | ICD-10-CM | POA: Diagnosis not present

## 2012-06-01 DIAGNOSIS — M5430 Sciatica, unspecified side: Secondary | ICD-10-CM | POA: Diagnosis not present

## 2012-06-01 DIAGNOSIS — M999 Biomechanical lesion, unspecified: Secondary | ICD-10-CM | POA: Diagnosis not present

## 2012-06-01 MED ORDER — OXYCODONE-ACETAMINOPHEN 5-325 MG PO TABS: 1.0000 | ORAL_TABLET | Freq: Two times a day (BID) | ORAL | Status: DC | PRN

## 2012-06-01 MED ORDER — QUESTRAN 4 GM/DOSE PO POWD: 2.0000 g | Freq: Two times a day (BID) | ORAL | Status: DC

## 2012-06-01 NOTE — Telephone Encounter (Signed)
Per Brennan Bailey called the pharmacist, Linus Orn and told her the Tub  Total 378 gm and pt is to take 2 gm bid.

## 2012-06-01 NOTE — Telephone Encounter (Signed)
Doris, please call pharm to see who started this med?  When?  It is not on pt's most recent med list in EPIC.  She has appt next week.  She no showed last visit/hospital follow up.   thanks

## 2012-06-01 NOTE — Telephone Encounter (Signed)
I called Tracey at [facility]. Pt first given the Questran by Neil Crouch, PA in the tub on 09/27/2008. Last Laban Emperor, NP gave the packets on 02/19/2015. Pt last had the refill on 01/08/2012. ( But she specifically requests the tub).

## 2012-06-01 NOTE — Telephone Encounter (Signed)
Med refilled. And awaiting signature and pickup

## 2012-06-01 NOTE — Telephone Encounter (Signed)
I will give enough to get to scheduled OV Thanks

## 2012-06-01 NOTE — Telephone Encounter (Signed)
Pt called and said she wants the can of powder for the questran and not the packets. She has an appt on 06/11/2012 and wants to know what to do until then. Please advise!

## 2012-06-01 NOTE — Addendum Note (Signed)
Addended by: Andria Meuse on: 06/01/2012 03:57 PM   Modules accepted: Orders

## 2012-06-08 ENCOUNTER — Encounter: Payer: Medicare Other | Admitting: Family Medicine

## 2012-06-09 DIAGNOSIS — M545 Low back pain: Secondary | ICD-10-CM | POA: Diagnosis not present

## 2012-06-09 DIAGNOSIS — M5430 Sciatica, unspecified side: Secondary | ICD-10-CM | POA: Diagnosis not present

## 2012-06-09 DIAGNOSIS — M999 Biomechanical lesion, unspecified: Secondary | ICD-10-CM | POA: Diagnosis not present

## 2012-06-10 ENCOUNTER — Encounter: Payer: Self-pay | Admitting: Gastroenterology

## 2012-06-11 ENCOUNTER — Ambulatory Visit (INDEPENDENT_AMBULATORY_CARE_PROVIDER_SITE_OTHER): Payer: Medicare Other | Admitting: Family Medicine

## 2012-06-11 ENCOUNTER — Other Ambulatory Visit (HOSPITAL_COMMUNITY)
Admission: RE | Admit: 2012-06-11 | Discharge: 2012-06-11 | Disposition: A | Discharge status: Home / Self Care | Payer: Medicare Other | Source: Ambulatory Visit | Attending: Family Medicine | Admitting: Family Medicine

## 2012-06-11 ENCOUNTER — Ambulatory Visit (INDEPENDENT_AMBULATORY_CARE_PROVIDER_SITE_OTHER): Payer: Medicare Other | Admitting: Urgent Care

## 2012-06-11 ENCOUNTER — Encounter: Payer: Self-pay | Admitting: Family Medicine

## 2012-06-11 ENCOUNTER — Encounter: Payer: Self-pay | Admitting: Urgent Care

## 2012-06-11 VITALS — BP 117/77 | HR 82 | Temp 97.4°F | Ht 61.0 in | Wt 123.2 lb

## 2012-06-11 VITALS — BP 130/82 | HR 90 | Resp 16 | Ht 61.0 in | Wt 124.0 lb

## 2012-06-11 DIAGNOSIS — J441 Chronic obstructive pulmonary disease with (acute) exacerbation: Secondary | ICD-10-CM

## 2012-06-11 DIAGNOSIS — Z01419 Encounter for gynecological examination (general) (routine) without abnormal findings: Secondary | ICD-10-CM | POA: Diagnosis not present

## 2012-06-11 DIAGNOSIS — Z124 Encounter for screening for malignant neoplasm of cervix: Secondary | ICD-10-CM | POA: Diagnosis not present

## 2012-06-11 DIAGNOSIS — M810 Age-related osteoporosis without current pathological fracture: Secondary | ICD-10-CM

## 2012-06-11 DIAGNOSIS — K219 Gastro-esophageal reflux disease without esophagitis: Secondary | ICD-10-CM | POA: Diagnosis not present

## 2012-06-11 DIAGNOSIS — IMO0002 Reserved for concepts with insufficient information to code with codable children: Secondary | ICD-10-CM

## 2012-06-11 DIAGNOSIS — I10 Essential (primary) hypertension: Secondary | ICD-10-CM

## 2012-06-11 DIAGNOSIS — F17200 Nicotine dependence, unspecified, uncomplicated: Secondary | ICD-10-CM

## 2012-06-11 DIAGNOSIS — Z720 Tobacco use: Secondary | ICD-10-CM

## 2012-06-11 DIAGNOSIS — E785 Hyperlipidemia, unspecified: Secondary | ICD-10-CM

## 2012-06-11 DIAGNOSIS — R1310 Dysphagia, unspecified: Secondary | ICD-10-CM

## 2012-06-11 DIAGNOSIS — Z1211 Encounter for screening for malignant neoplasm of colon: Secondary | ICD-10-CM | POA: Insufficient documentation

## 2012-06-11 MED ORDER — PREDNISONE 10 MG PO TABS: ORAL_TABLET | ORAL | Status: DC

## 2012-06-11 MED ORDER — OXYCODONE-ACETAMINOPHEN 5-325 MG PO TABS: 1.0000 | ORAL_TABLET | Freq: Two times a day (BID) | ORAL | Status: DC | PRN

## 2012-06-11 MED ORDER — DOXYCYCLINE HYCLATE 100 MG PO TABS: 100.0000 mg | ORAL_TABLET | Freq: Two times a day (BID) | ORAL | Status: AC

## 2012-06-11 MED ORDER — DEXLANSOPRAZOLE 60 MG PO CPDR: 60.0000 mg | DELAYED_RELEASE_CAPSULE | Freq: Every day | ORAL | Status: DC

## 2012-06-11 NOTE — Assessment & Plan Note (Signed)
Pap smear done. Mammogram normal. Immunizations up-to-date

## 2012-06-11 NOTE — Assessment & Plan Note (Signed)
Counseled on importance of smoking cessation setting of her osteoporosis her COPD her Crohn's disease

## 2012-06-11 NOTE — Assessment & Plan Note (Signed)
She's not been using her Symbicort as prescribed. Acute exacerbation today antibiotics, prednisone, inhalers

## 2012-06-11 NOTE — Assessment & Plan Note (Signed)
Well controlled 

## 2012-06-11 NOTE — Patient Instructions (Addendum)
You will be set up for IV Reclast for osteoporosis Use the ventolin every 4 hours as needed- rescue inhaler Use the symbicort twice a day Antibiotic and steroids for your COPD Restart neurontin at bedtime Pain medication refilled today  F/U 3 months

## 2012-06-11 NOTE — Assessment & Plan Note (Signed)
Much improved on questran

## 2012-06-11 NOTE — Assessment & Plan Note (Signed)
She has chronic pain in multiple joints will have her restart Neurontin for her back pain and neck pain it does not improve send her back to neurosurgery Dr. Arnoldo Morale. She is on a pain contract and she has Percocet which does help control her pain she does take an extra half a tablet on Sundays to her next prescription I will allow her 70 tablets

## 2012-06-11 NOTE — Progress Notes (Signed)
  Subjective:    Patient ID: [name] [name], female    DOB: [birth year], 57 y.o.   MRN: [id number]  HPI  Patient here for Pap smear and breast exam. Her labs and osteoporosis screening were also reviewed Medications reviewed Immunizations up-to-date She's had cough with production sore throat and wheezing for the past week she's not tried any over-the-counter medications she uses her Ventolin some she's not been using her Symbicort on a regular basis. She continues to have pain all over her back her neck her hand which she was evaluated by orthopedics for her as well as her ankles. She states that she had some swelling in her left ankle and her chiropractor told her that she may of had heart failure Review of Systems - per above    GEN- + fatigue, fever, weight loss,weakness, recent illness HEENT- denies eye drainage, change in vision, nasal discharge, CVS- denies chest pain, palpitations RESP- denies SOB, +cough, +wheeze ABD- denies N/V, change in stools, abd pain GU- denies dysuria, hematuria, dribbling, incontinence MSK- + joint pain, muscle aches, injury Neuro- denies headache, dizziness, syncope, seizure activity      Objective:   Physical Exam GEN- NAD, alert and oriented x3 HEENT- PERRL, EOMI, non injected sclera, pink conjunctiva, MMM, oropharynx + injection, TM clear bilat no effusion,  No maxillary sinus tenderness,nares clear  Neck- Supple, no LAD Breast- normal symmetry, no nipple inversion,no nipple drainage, no nodules or lumps felt Nodes- no axillary nodes CVS- RRR, no murmur RESP- few bilateral wheeze, decreased air movement at bases, harsh cough, mild rhonchi ABD-NABS,soft,NT,ND  GU- normal external genitalia, vaginal mucosa pink and moist, cervix visualized no growth, no blood form os, no discharge, no CMT, no ovarian masses, uterus normal size, urethra normal position Rectum- normal tone, external skin tags, FOBT neg EXT- No edema Pulses- Radial 2+DP  2+           Assessment & Plan:

## 2012-06-11 NOTE — Patient Instructions (Addendum)
Trial dexilant 44m daily Stop protonix We will call with ba pill results

## 2012-06-11 NOTE — Assessment & Plan Note (Signed)
Unfortunately she has many GI problems such as Crohn's disease and history of gastric ulcer. She also has it her bowels and acid reflux. Her osteoporosis has worsened since her bone density in 2010 I will get her set up for IV Reclast

## 2012-06-11 NOTE — Progress Notes (Signed)
Referring Provider: Alycia Rossetti, MD Primary Care Physician:  [name], MD Primary Gastroenterologist:  Dr. Barney Drain  Chief Complaint  Patient presents with  . Follow-up    pain in side    HPI:  [name] [name] is a 57 y.o. female here for follow up for dysphagia.  She is s/p last dilation of distal esophageal stricture Aug 2013.  She also had mild gastritis & duodenitis.  She has been eating small bites.  She feels like it takes a while to swallow.  She feels like her food gets stuck retrosternally.  Mostly problems with solid foods.  She saw Dr Benjamine Mola (ENT) previously & was sent to a [facility] vocal cord specialist & had surgery in 2013.  She says right after her EGD in the fall, she has a horrible stomach virus & stayed on [facility] for 2 days.  She takes Oxycodone prescribed by Dr. Buelah Manis BID for upper abd pain, chronic back pain & pain after her hand surgery.  She quit protonix for a couple days because she felt it may have caused abdominal cramps.  Previously, Nexium caused abdominal pain & diarrhea.    Past Medical History  Diagnosis Date  . Elevated liver enzymes 2009: BMI 24 ?Etoh ALT 94, AST 51,  NEG ANA, qIGs& ASMA    MAY 2012 AST 52 ALT 38  . Anastomotic ulcer JAN 2009  . Esophageal stricture 2009  . Diarrhea MULITIFACTORIAL    IBS, LACTOSE INTOLERANCE, SBBO, BILE-SALT  . Inflammatory bowel disease 2006 CD    SURGICAL REMISSION  . LBP (low back pain)   . Migraine   . Asthma   . Allergic rhinitis   . CTS (carpal tunnel syndrome)   . Hyperlipemia   . Anxiety   . Hemorrhoid   . Crohn disease   . BMI (body mass index) 20.0-29.9 2009 121 lbs  . 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 Shortness of breath     exertion and humidity  . GERD (gastroesophageal reflux disease)   . Hypertension   . Sleep apnea     Stop [name]    Past Surgical History  Procedure Date  . Hemicolectomy RIGHT 2006  . Hernia repair   . Carpal tunnel release LEFT  . Lumbar disc  surgery   . Vocal cord surgery Sep 20, 2010    precancerous areas removed  . Esophagogastroduodenoscopy 02/07/09    mild gastritis  . Colonoscopy JAN 2009 DIARRHEA, ABD PAIN, BRBPR    ANASTOMOTIC ULCER(3MM), IH NL:ZJQBHA ULCER, NL COLON bX  . Upper gastrointestinal endoscopy JAN 2009 ABD PAIN    GASTRITIS, ESO RING  . Upper gastrointestinal endoscopy OCT 2010 ABD PAIN, DYSPHAGIA    DIL 15MM, GASTRITIS, NL DUODENUM  . Upper gastrointestinal endoscopy OCT 2010 DYSPHAGIA    DIL 17 MM, GASTRITIS, NL DUODENUM  . Dilation and curettage of uterus   . Bilateral salpingoophorectomy   . Wisdom tooth extraction     pin placement due to broken jaw  . Colon surgery   . Back surgery   . Esophageal dilation 12/24/2011    SLF: A stricture was found in the distal esophagus/ Moderate gastritis/ MILD Duodenitis  . Esophageal biopsy 12/24/2011    Procedure: BIOPSY;  Surgeon: Danie Binder, MD;  Location: [facility];  Service: Endoscopy;;  Gastric Biopsies    Current Outpatient Prescriptions  Medication Sig Dispense Refill  . Alum & Mag Hydroxide-Simeth (MAGIC MOUTHWASH) SOLN Take 5 mLs  by mouth as needed. Swish and Swallow      . amLODipine (NORVASC) 5 MG tablet Take 1 tablet (5 mg total) by mouth daily.  30 tablet  3  . calcium carbonate (TUMS - DOSED IN MG ELEMENTAL CALCIUM) 500 MG chewable tablet Chew 1 tablet by mouth as needed. For heartburn      . diazepam (VALIUM) 5 MG tablet Take 0.5 tablets (2.5 mg total) by mouth 2 (two) times daily as needed. For spasms  30 tablet  2  . gabapentin (NEURONTIN) 300 MG capsule Take 300 mg by mouth at bedtime.      . meclizine (ANTIVERT) 12.5 MG tablet Take 1 tablet (12.5 mg total) by mouth 2 (two) times daily.  30 tablet  1  . QUESTRAN 4 GM/DOSE powder Take 0.5 packets (2 g total) by mouth 2 (two) times daily with a meal.  378 g  0  . quinapril-hydrochlorothiazide (ACCURETIC) 10-12.5 MG per tablet Take 1 tablet by mouth daily.  30 tablet  3  . albuterol (VENTOLIN  HFA) 108 (90 BASE) MCG/ACT inhaler Inhale 2 puffs into the lungs every 4 (four) hours as needed.      . budesonide-formoterol (SYMBICORT) 160-4.5 MCG/ACT inhaler Inhale 2 puffs into the lungs 2 (two) times daily.      Marland Kitchen dexlansoprazole (DEXILANT) 60 MG capsule Take 1 capsule (60 mg total) by mouth daily.  31 capsule  2  . doxycycline (VIBRA-TABS) 100 MG tablet Take 1 tablet (100 mg total) by mouth 2 (two) times daily.  14 tablet  0  . oxyCODONE-acetaminophen (ROXICET) 5-325 MG per tablet Take 1 tablet by mouth 2 (two) times daily as needed for pain.  70 tablet  0  . predniSONE (DELTASONE) 10 MG tablet Take 78m by mouth daily x 5 days  10 tablet  0    Allergies as of 06/11/2012 - Review Complete 06/11/2012  Allergen Reaction Noted  . Lovastatin Other (See Comments) 09/25/2010    Review of Systems: Gen: Denies any fever, chills, sweats, anorexia, fatigue, weakness, malaise, weight loss, and sleep disorder CV: Denies chest pain, angina, palpitations, syncope, orthopnea, PND, peripheral edema, and claudication. Resp: Denies dyspnea at rest, dyspnea with exercise, cough, sputum, wheezing, coughing up blood, and pleurisy. GI: Denies vomiting blood, jaundice, and fecal incontinence.    Derm: Denies rash, itching, dry skin, hives, moles, warts, or unhealing ulcers.  Psych: Denies depression, anxiety, memory loss, suicidal ideation, hallucinations, paranoia, and confusion. Heme: Denies bruising, bleeding, and enlarged lymph nodes.  Physical Exam: BP 117/77  Pulse 82  Temp 97.4 F (36.3 C) (Oral)  Ht 5' 1"  (1.549 m)  Wt 123 lb 3.2 oz (55.883 kg)  BMI 23.28 kg/m2 General:   Alert,  Well-developed, well-nourished, pleasant and cooperative in NAD Eyes:  Sclera clear, no icterus.   Conjunctiva pink. Mouth:  No deformity or lesions, oropharynx pink and moist. Neck:  Supple; no masses or thyromegaly. Heart:  Regular rate and rhythm; no murmurs, clicks, rubs,  or gallops. Abdomen:  Normal bowel  sounds.  No bruits.  Soft, non-tender and non-distended without masses, hepatosplenomegaly or hernias noted.  No guarding or rebound tenderness.   Rectal:  Deferred.  Msk:  Symmetrical without gross deformities.  Pulses:  Normal pulses noted. Extremities:  No clubbing or edema. Neurologic:  Alert and oriented x4;  grossly normal neurologically. Skin:  Intact without significant lesions or rashes.

## 2012-06-12 ENCOUNTER — Encounter: Payer: Self-pay | Admitting: Urgent Care

## 2012-06-12 NOTE — Assessment & Plan Note (Signed)
Recurrent dysphagia in setting of known Schatzki's ring.  Last EGD/ED Aug 2013 showed distal stricture, mild duodenitis & gastritis.    BPE

## 2012-06-12 NOTE — Assessment & Plan Note (Signed)
Trial Dexilant 90m daily Stop Protonix

## 2012-06-15 DIAGNOSIS — M5430 Sciatica, unspecified side: Secondary | ICD-10-CM | POA: Diagnosis not present

## 2012-06-15 DIAGNOSIS — M999 Biomechanical lesion, unspecified: Secondary | ICD-10-CM | POA: Diagnosis not present

## 2012-06-15 DIAGNOSIS — M545 Low back pain: Secondary | ICD-10-CM | POA: Diagnosis not present

## 2012-06-15 NOTE — Progress Notes (Signed)
Faxed to PCP

## 2012-06-17 ENCOUNTER — Other Ambulatory Visit (HOSPITAL_COMMUNITY): Payer: Medicare Other

## 2012-06-18 ENCOUNTER — Ambulatory Visit (HOSPITAL_COMMUNITY): Payer: Medicare Other

## 2012-06-19 ENCOUNTER — Telehealth: Payer: Self-pay | Admitting: Family Medicine

## 2012-06-22 ENCOUNTER — Telehealth: Payer: Self-pay | Admitting: Family Medicine

## 2012-06-22 ENCOUNTER — Other Ambulatory Visit: Payer: Self-pay

## 2012-06-22 MED ORDER — OXYCODONE-ACETAMINOPHEN 5-325 MG PO TABS: 1.0000 | ORAL_TABLET | Freq: Two times a day (BID) | ORAL | Status: DC | PRN

## 2012-06-22 MED ORDER — OXYCODONE-ACETAMINOPHEN 5-325 MG PO TABS: ORAL_TABLET | ORAL | Status: DC

## 2012-06-22 MED ORDER — DIAZEPAM 5 MG PO TABS: 2.5000 mg | ORAL_TABLET | Freq: Two times a day (BID) | ORAL | Status: DC | PRN

## 2012-06-22 NOTE — Telephone Encounter (Signed)
Meds refilled 06/22/12

## 2012-06-22 NOTE — Telephone Encounter (Signed)
She is stating the directions are wrong for the percocet but I just printed the historic one that you had entered already

## 2012-06-22 NOTE — Telephone Encounter (Signed)
Med filled.  

## 2012-06-22 NOTE — Telephone Encounter (Signed)
This has to be collected from the office

## 2012-06-22 NOTE — Telephone Encounter (Signed)
I spoke with pt, regarding her pain medication and Valium , she states she was not being rude but was irritated she could not get her valium on time. Discussed with her about her pain meds, she has been given 70 tablets for the month, she understood this, but ran out early from last month and pharmacy would  Not fill I discussed her behavior she denies being rude, but states she may have elevated her voice Spoke with pharmacy Will change so that script says 1 po TID prn, 70 to last entire month Pt voiced understanding If she has any other problems, refer to pain clinic

## 2012-06-23 ENCOUNTER — Ambulatory Visit (HOSPITAL_COMMUNITY): Payer: Medicare Other

## 2012-06-24 ENCOUNTER — Other Ambulatory Visit: Payer: Self-pay | Admitting: Gastroenterology

## 2012-06-24 ENCOUNTER — Ambulatory Visit (HOSPITAL_COMMUNITY)
Admission: RE | Admit: 2012-06-24 | Discharge: 2012-06-24 | Disposition: A | Discharge status: Home / Self Care | Payer: Medicare Other | Source: Ambulatory Visit | Attending: Urgent Care | Admitting: Urgent Care

## 2012-06-24 DIAGNOSIS — K2289 Other specified disease of esophagus: Secondary | ICD-10-CM | POA: Insufficient documentation

## 2012-06-24 DIAGNOSIS — R1310 Dysphagia, unspecified: Secondary | ICD-10-CM | POA: Diagnosis not present

## 2012-06-24 DIAGNOSIS — K228 Other specified diseases of esophagus: Secondary | ICD-10-CM | POA: Insufficient documentation

## 2012-06-24 DIAGNOSIS — R4789 Other speech disturbances: Secondary | ICD-10-CM | POA: Diagnosis not present

## 2012-06-24 NOTE — Progress Notes (Signed)
Patient is scheduled in the OR w/SLF on Tues Feb 25 an I have mailed instructions and she is aware, but I am routing back to [place] she is asking should she continue the Dukes Mixture of discontinue please advise?

## 2012-06-24 NOTE — Progress Notes (Signed)
Quick Note:  Discussed w/ pt. Continues to have dysphagia. Pt will need EGD w/ possible ED w/ [facility] in OR w/ propofol re: Persistent dysphagia, diffuse thickening of the esophageal mucosa on BPE Continue dexilant 51m daily I have discussed risks & benefits which include, but are not limited to, bleeding, infection, perforation & drug reaction. The patient agrees with this plan & written consent will be obtained.  Pt agrees w/ plan. Please arrange, Thanks CSJ:GGEZMO KLonell Grandchild MD   ______

## 2012-06-24 NOTE — Progress Notes (Signed)
I called the pt. She said she still has some of the [employer] Magic mouth wash from 12/2011. It has been in the refriegerator. She would like to know if it is OK to use prn when her throat is hurting a little. Please advise!

## 2012-06-25 ENCOUNTER — Encounter (HOSPITAL_COMMUNITY): Payer: Self-pay | Admitting: Pharmacy Technician

## 2012-06-25 NOTE — Patient Instructions (Signed)
ANDRIENNE HAVENER  06/25/2012   Your procedure is scheduled on:  06/30/12  Report to [facility] at 0700 AM.  Call this number if you have problems the morning of surgery: [phone]   Remember:   Do not eat food or drink liquids after midnight.   Take these medicines the morning of surgery with A SIP OF WATER: norvasc, dexilant, accuretic, albuterol, symbicort, neurontin   Do not wear jewelry, make-up or nail polish.  Do not wear lotions, powders, or perfumes. You may wear deodorant.  Do not shave 48 hours prior to surgery. Men may shave face and neck.  Do not bring valuables to the hospital.  Contacts, dentures or bridgework may not be worn into surgery.  Leave suitcase in the car. After surgery it may be brought to your room.  For patients admitted to the hospital, checkout time is 11:00 AM the day of  discharge.   Patients discharged the day of surgery will not be allowed to drive  home.  Name and phone number of your driver: family  Special Instructions: N/A   Please read over the following fact sheets that you were given: Anesthesia Post-op Instructions and Care and Recovery After Surgery   PATIENT INSTRUCTIONS POST-ANESTHESIA  IMMEDIATELY FOLLOWING SURGERY:  Do not drive or operate machinery for the first twenty four hours after surgery.  Do not make any important decisions for twenty four hours after surgery or while taking narcotic pain medications or sedatives.  If you develop intractable nausea and vomiting or a severe headache please notify your doctor immediately.  FOLLOW-UP:  Please make an appointment with your surgeon as instructed. You do not need to follow up with anesthesia unless specifically instructed to do so.  WOUND CARE INSTRUCTIONS (if applicable):  Keep a dry clean dressing on the anesthesia/puncture wound site if there is drainage.  Once the wound has quit draining you may leave it open to air.  Generally you should leave the bandage intact for twenty four hours  unless there is drainage.  If the epidural site drains for more than 36-48 hours please call the anesthesia department.  QUESTIONS?:  Please feel free to call your physician or the hospital operator if you have any questions, and they will be happy to assist you.      Esophagogastroduodenoscopy This is an endoscopic procedure (a procedure that uses a device like a flexible telescope) that allows your caregiver to view the upper stomach and small bowel. This test allows your caregiver to look at the esophagus. The esophagus carries food from your mouth to your stomach. They can also look at your duodenum. This is the first part of the small intestine that attaches to the stomach. This test is used to detect problems in the bowel such as ulcers and inflammation. PREPARATION FOR TEST Nothing to eat after midnight the day before the test. NORMAL FINDINGS Normal esophagus, stomach, and duodenum. Ranges for normal findings may vary among different laboratories and hospitals. You should always check with your doctor after having lab work or other tests done to discuss the meaning of your test results and whether your values are considered within normal limits. MEANING OF TEST  Your caregiver will go over the test results with you and discuss the importance and meaning of your results, as well as treatment options and the need for additional tests if necessary. OBTAINING THE TEST RESULTS It is your responsibility to obtain your test results. Ask the lab or department performing the  test when and how you will get your results. Document Released: 08/23/2004 Document Revised: 07/15/2011 Document Reviewed: 04/01/2008 [facility] Patient Information 2013 [place].

## 2012-06-25 NOTE — Progress Notes (Signed)
Per Vickey Huger, NP, ok to use the [facility] Magic Mouth Wash prn. She should call the pharmacy to find out about the shelf life if her bottle is dated 12/2011. Called and informed the pt.

## 2012-06-25 NOTE — Progress Notes (Signed)
PT NEEDS EGD TO EVALUATE THICKENED ESOPHAGUS. UNLIKLEY SHE NEEDS DILATION. IF EGD SHOWS ESOPHAGITIS & NOT MASS, PT WILL NEED ESOPHAGEAL MANOMETRY AT [facility].

## 2012-06-26 ENCOUNTER — Encounter (HOSPITAL_COMMUNITY): Payer: Self-pay

## 2012-06-26 ENCOUNTER — Encounter (HOSPITAL_COMMUNITY)
Admission: RE | Admit: 2012-06-26 | Discharge: 2012-06-26 | Disposition: A | Discharge status: Home / Self Care | Payer: Medicare Other | Source: Ambulatory Visit | Attending: Gastroenterology | Admitting: Gastroenterology

## 2012-06-26 DIAGNOSIS — K449 Diaphragmatic hernia without obstruction or gangrene: Secondary | ICD-10-CM | POA: Diagnosis not present

## 2012-06-26 DIAGNOSIS — E785 Hyperlipidemia, unspecified: Secondary | ICD-10-CM | POA: Diagnosis not present

## 2012-06-26 DIAGNOSIS — Z01812 Encounter for preprocedural laboratory examination: Secondary | ICD-10-CM | POA: Diagnosis not present

## 2012-06-26 DIAGNOSIS — M999 Biomechanical lesion, unspecified: Secondary | ICD-10-CM | POA: Diagnosis not present

## 2012-06-26 DIAGNOSIS — I10 Essential (primary) hypertension: Secondary | ICD-10-CM | POA: Diagnosis not present

## 2012-06-26 DIAGNOSIS — J449 Chronic obstructive pulmonary disease, unspecified: Secondary | ICD-10-CM | POA: Diagnosis not present

## 2012-06-26 DIAGNOSIS — M5430 Sciatica, unspecified side: Secondary | ICD-10-CM | POA: Diagnosis not present

## 2012-06-26 DIAGNOSIS — R1310 Dysphagia, unspecified: Secondary | ICD-10-CM | POA: Diagnosis not present

## 2012-06-26 DIAGNOSIS — M545 Low back pain: Secondary | ICD-10-CM | POA: Diagnosis not present

## 2012-06-26 DIAGNOSIS — K2940 Chronic atrophic gastritis without bleeding: Secondary | ICD-10-CM | POA: Diagnosis not present

## 2012-06-26 DIAGNOSIS — K319 Disease of stomach and duodenum, unspecified: Secondary | ICD-10-CM | POA: Diagnosis not present

## 2012-06-26 HISTORY — DX: Nausea with vomiting, unspecified: Z98.890

## 2012-06-26 HISTORY — DX: Other specified postprocedural states: R11.2

## 2012-06-26 HISTORY — DX: Other specified postprocedural states: Z98.890

## 2012-06-26 LAB — BASIC METABOLIC PANEL
Calcium: 9 mg/dL (ref 8.4–10.5)
GFR calc non Af Amer: >90 mL/min (ref >90)
Glucose, Bld: 84 mg/dL (ref 70–99)
Potassium: 3.8 mEq/L (ref 3.5–5.1)
Sodium: 141 mEq/L (ref 135–145)

## 2012-06-26 LAB — HEMOGLOBIN AND HEMATOCRIT, BLOOD: HCT: 39.8 % (ref 36.0–46.0)

## 2012-06-30 ENCOUNTER — Encounter (HOSPITAL_COMMUNITY): Payer: Self-pay | Admitting: Anesthesiology

## 2012-06-30 ENCOUNTER — Encounter (HOSPITAL_COMMUNITY): Payer: Self-pay | Admitting: *Deleted

## 2012-06-30 ENCOUNTER — Telehealth: Payer: Self-pay | Admitting: Gastroenterology

## 2012-06-30 ENCOUNTER — Ambulatory Visit (HOSPITAL_COMMUNITY): Payer: Medicare Other | Admitting: Anesthesiology

## 2012-06-30 ENCOUNTER — Ambulatory Visit (HOSPITAL_COMMUNITY)
Admission: RE | Admit: 2012-06-30 | Discharge: 2012-06-30 | Disposition: A | Discharge status: Home / Self Care | Payer: Medicare Other | Source: Ambulatory Visit | Attending: Gastroenterology | Admitting: Gastroenterology

## 2012-06-30 ENCOUNTER — Encounter (HOSPITAL_COMMUNITY)
Admission: RE | Disposition: A | Discharge status: Home / Self Care | Payer: Self-pay | Source: Ambulatory Visit | Attending: Gastroenterology

## 2012-06-30 DIAGNOSIS — R1310 Dysphagia, unspecified: Secondary | ICD-10-CM | POA: Insufficient documentation

## 2012-06-30 DIAGNOSIS — K2990 Gastroduodenitis, unspecified, without bleeding: Secondary | ICD-10-CM | POA: Diagnosis not present

## 2012-06-30 DIAGNOSIS — K219 Gastro-esophageal reflux disease without esophagitis: Secondary | ICD-10-CM | POA: Diagnosis not present

## 2012-06-30 DIAGNOSIS — J4489 Other specified chronic obstructive pulmonary disease: Secondary | ICD-10-CM | POA: Insufficient documentation

## 2012-06-30 DIAGNOSIS — I10 Essential (primary) hypertension: Secondary | ICD-10-CM | POA: Insufficient documentation

## 2012-06-30 DIAGNOSIS — K319 Disease of stomach and duodenum, unspecified: Secondary | ICD-10-CM | POA: Insufficient documentation

## 2012-06-30 DIAGNOSIS — Z01812 Encounter for preprocedural laboratory examination: Secondary | ICD-10-CM | POA: Insufficient documentation

## 2012-06-30 DIAGNOSIS — K2970 Gastritis, unspecified, without bleeding: Secondary | ICD-10-CM | POA: Diagnosis not present

## 2012-06-30 DIAGNOSIS — K449 Diaphragmatic hernia without obstruction or gangrene: Secondary | ICD-10-CM | POA: Insufficient documentation

## 2012-06-30 DIAGNOSIS — J449 Chronic obstructive pulmonary disease, unspecified: Secondary | ICD-10-CM | POA: Insufficient documentation

## 2012-06-30 DIAGNOSIS — K2940 Chronic atrophic gastritis without bleeding: Secondary | ICD-10-CM | POA: Diagnosis not present

## 2012-06-30 DIAGNOSIS — E785 Hyperlipidemia, unspecified: Secondary | ICD-10-CM | POA: Insufficient documentation

## 2012-06-30 DIAGNOSIS — K222 Esophageal obstruction: Secondary | ICD-10-CM | POA: Diagnosis not present

## 2012-06-30 HISTORY — PX: ESOPHAGOGASTRODUODENOSCOPY (EGD) WITH PROPOFOL: SHX5813

## 2012-06-30 HISTORY — PX: SAVORY DILATION: SHX5439

## 2012-06-30 HISTORY — PX: BIOPSY: SHX5522

## 2012-06-30 SURGERY — ESOPHAGOGASTRODUODENOSCOPY (EGD) WITH PROPOFOL
Anesthesia: Monitor Anesthesia Care

## 2012-06-30 MED ORDER — FENTANYL CITRATE 0.05 MG/ML IJ SOLN
INTRAMUSCULAR | Status: AC
  Filled 2012-06-30: qty 2

## 2012-06-30 MED ORDER — BUTAMBEN-TETRACAINE-BENZOCAINE 2-2-14 % EX AERO
INHALATION_SPRAY | CUTANEOUS | Status: DC | PRN
  Administered 2012-06-30: 1 via TOPICAL

## 2012-06-30 MED ORDER — LIDOCAINE HCL 1 % IJ SOLN
INTRAMUSCULAR | Status: DC | PRN
  Administered 2012-06-30: 50 mg via INTRADERMAL

## 2012-06-30 MED ORDER — MINERAL OIL LIGHT 100 % EX OIL
TOPICAL_OIL | CUTANEOUS | Status: DC | PRN
  Administered 2012-06-30: 1 via TOPICAL

## 2012-06-30 MED ORDER — FENTANYL CITRATE 0.05 MG/ML IJ SOLN
INTRAMUSCULAR | Status: DC | PRN
  Administered 2012-06-30: 50 ug via INTRAVENOUS
  Administered 2012-06-30: 25 ug via INTRAVENOUS
  Administered 2012-06-30: 50 ug via INTRAVENOUS
  Administered 2012-06-30: 25 ug via INTRAVENOUS

## 2012-06-30 MED ORDER — LACTATED RINGERS IV SOLN
INTRAVENOUS | Status: DC
  Administered 2012-06-30: 1000 mL via INTRAVENOUS

## 2012-06-30 MED ORDER — PROPOFOL 10 MG/ML IV EMUL
INTRAVENOUS | Status: AC
  Filled 2012-06-30: qty 40

## 2012-06-30 MED ORDER — MIDAZOLAM HCL 2 MG/2ML IJ SOLN
INTRAMUSCULAR | Status: AC
  Filled 2012-06-30: qty 2

## 2012-06-30 MED ORDER — BUTAMBEN-TETRACAINE-BENZOCAINE 2-2-14 % EX AERO
1.0000 | INHALATION_SPRAY | Freq: Once | CUTANEOUS | Status: AC
  Administered 2012-06-30: 1 via TOPICAL
  Filled 2012-06-30: qty 56

## 2012-06-30 MED ORDER — STERILE WATER FOR IRRIGATION IR SOLN
Status: DC | PRN
  Administered 2012-06-30: 08:00:00

## 2012-06-30 MED ORDER — MIDAZOLAM HCL 2 MG/2ML IJ SOLN
1.0000 mg | INTRAMUSCULAR | Status: DC | PRN
  Administered 2012-06-30: 2 mg via INTRAVENOUS

## 2012-06-30 MED ORDER — ONDANSETRON HCL 4 MG/2ML IJ SOLN: 4.0000 mg | Freq: Once | INTRAMUSCULAR | Status: DC | PRN

## 2012-06-30 MED ORDER — ONDANSETRON HCL 4 MG/2ML IJ SOLN
INTRAMUSCULAR | Status: AC
  Filled 2012-06-30: qty 2

## 2012-06-30 MED ORDER — PROPOFOL 10 MG/ML IV BOLUS
INTRAVENOUS | Status: DC | PRN
  Administered 2012-06-30: 20 mg via INTRAVENOUS
  Administered 2012-06-30: 30 mg via INTRAVENOUS
  Administered 2012-06-30 (×4): 20 mg via INTRAVENOUS
  Administered 2012-06-30: 30 mg via INTRAVENOUS

## 2012-06-30 MED ORDER — FENTANYL CITRATE 0.05 MG/ML IJ SOLN: 25.0000 ug | INTRAMUSCULAR | Status: DC | PRN

## 2012-06-30 MED ORDER — FENTANYL CITRATE 0.05 MG/ML IJ SOLN
25.0000 ug | INTRAMUSCULAR | Status: DC | PRN
  Administered 2012-06-30: 25 ug via INTRAVENOUS

## 2012-06-30 MED ORDER — LACTATED RINGERS IV SOLN
INTRAVENOUS | Status: DC | PRN
  Administered 2012-06-30: 09:00:00 via INTRAVENOUS

## 2012-06-30 MED ORDER — ONDANSETRON HCL 4 MG/2ML IJ SOLN
4.0000 mg | Freq: Once | INTRAMUSCULAR | Status: AC
  Administered 2012-06-30: 4 mg via INTRAVENOUS

## 2012-06-30 MED ORDER — LIDOCAINE HCL (PF) 1 % IJ SOLN
INTRAMUSCULAR | Status: AC
  Filled 2012-06-30: qty 5

## 2012-06-30 MED ORDER — PROPOFOL INFUSION 10 MG/ML OPTIME
INTRAVENOUS | Status: DC | PRN
  Administered 2012-06-30: 100 ug/kg/min via INTRAVENOUS

## 2012-06-30 SURGICAL SUPPLY — 18 items
BLOCK BITE 60FR ADLT L/F BLUE (MISCELLANEOUS) ×2 IMPLANT
ELECT REM PT RETURN 9FT ADLT (ELECTROSURGICAL)
ELECTRODE REM PT RTRN 9FT ADLT (ELECTROSURGICAL) IMPLANT
FLOOR PAD 36X40 (MISCELLANEOUS) ×2
FORCEP RJ3 GP 1.8X160 W-NEEDLE (CUTTING FORCEPS) IMPLANT
FORCEPS BIOP RAD 4 LRG CAP 4 (CUTTING FORCEPS) ×2 IMPLANT
MANIFOLD NEPTUNE II (INSTRUMENTS) ×2 IMPLANT
NEEDLE SCLEROTHERAPY 25GX240 (NEEDLE) IMPLANT
PAD FLOOR 36X40 (MISCELLANEOUS) ×1 IMPLANT
PROBE APC STR FIRE (PROBE) IMPLANT
PROBE INJECTION GOLD (MISCELLANEOUS)
PROBE INJECTION GOLD 7FR (MISCELLANEOUS) IMPLANT
SNARE ROTATE MED OVAL 20MM (MISCELLANEOUS) IMPLANT
SNARE SHORT THROW 13M SML OVAL (MISCELLANEOUS) IMPLANT
SYR 50ML LL SCALE MARK (SYRINGE) ×2 IMPLANT
TUBING ENDO SMARTCAP PENTAX (MISCELLANEOUS) ×2 IMPLANT
TUBING IRRIGATION ENDOGATOR (MISCELLANEOUS) ×2 IMPLANT
WATER STERILE IRR 1000ML POUR (IV SOLUTION) ×4 IMPLANT

## 2012-06-30 NOTE — H&P (Signed)
Primary Care Physician:  [name], MD Primary Gastroenterologist:  Dr. Oneida Alar  Pre-Procedure History & Physical: HPI:  [name] [name] is a 57 y.o. female here for DYSPHAGIA./ABNL ESOPHAGUS ON CT.  Past Medical History  Diagnosis Date  . Elevated liver enzymes 2009: BMI 24 ?Etoh ALT 94, AST 51,  NEG ANA, qIGs& ASMA    MAY 2012 AST 52 ALT 38  . Anastomotic ulcer JAN 2009  . Esophageal stricture 2009  . Diarrhea MULITIFACTORIAL    IBS, LACTOSE INTOLERANCE, SBBO, BILE-SALT  . Inflammatory bowel disease 2006 CD    SURGICAL REMISSION  . LBP (low back pain)   . Migraine   . Asthma   . Allergic rhinitis   . CTS (carpal tunnel syndrome)   . Hyperlipemia   . Anxiety   . Hemorrhoid   . Crohn disease   . BMI (body mass index) 20.0-29.9 2009 121 lbs  . 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 Shortness of breath     exertion and humidity  . GERD (gastroesophageal reflux disease)   . Hypertension   . Sleep apnea     Stop Bang Cock score of 4  . PONV (postoperative nausea and vomiting)     Past Surgical History  Procedure Laterality Date  . Hemicolectomy  RIGHT 2006  . Hernia repair    . Carpal tunnel release  LEFT  . Lumbar disc surgery    . Vocal cord surgery  Sep 20, 2010    precancerous areas removed  . Esophagogastroduodenoscopy  02/07/09    mild gastritis  . Colonoscopy  JAN 2009 DIARRHEA, ABD PAIN, BRBPR    ANASTOMOTIC ULCER(3MM), IH ZO:XWRUEA ULCER, NL COLON bX  . Upper gastrointestinal endoscopy  JAN 2009 ABD PAIN    GASTRITIS, ESO RING  . Upper gastrointestinal endoscopy  OCT 2010 ABD PAIN, DYSPHAGIA    DIL 15MM, GASTRITIS, NL DUODENUM  . Upper gastrointestinal endoscopy  OCT 2010 DYSPHAGIA    DIL 17 MM, GASTRITIS, NL DUODENUM  . Dilation and curettage of uterus    . Bilateral salpingoophorectomy    . Wisdom tooth extraction      pin placement due to broken jaw  . Colon surgery    . Back surgery    . Esophageal dilation  12/24/2011    SLF: A stricture was found  in the distal esophagus/ Moderate gastritis/ MILD Duodenitis  . Esophageal biopsy  12/24/2011    Procedure: BIOPSY;  Surgeon: Danie Binder, MD;  Location: [facility];  Service: Endoscopy;;  Gastric Biopsies    Prior to Admission medications   Medication Sig Start Date End Date Taking? Authorizing Provider  albuterol (VENTOLIN HFA) 108 (90 BASE) MCG/ACT inhaler Inhale 2 puffs into the lungs every 6 (six) hours as needed for shortness of breath.    Yes Historical Provider, MD  Alum & Mag Hydroxide-Simeth (MAGIC MOUTHWASH) SOLN Take 5 mLs by mouth as needed. Swish and Swallow   Yes Historical Provider, MD  amLODipine (NORVASC) 5 MG tablet Take 1 tablet (5 mg total) by mouth daily. 05/29/12  Yes Alycia Rossetti, MD  budesonide-formoterol [facility]) 160-4.5 MCG/ACT inhaler Inhale 2 puffs into the lungs daily.    Yes Historical Provider, MD  calcium carbonate (TUMS - DOSED IN MG ELEMENTAL CALCIUM) 500 MG chewable tablet Chew 1 tablet by mouth as needed. For heartburn   Yes Historical Provider, MD  dexlansoprazole (DEXILANT) 60 MG capsule Take 1 capsule (60 mg total) by mouth daily. 06/11/12  Yes  Andria Meuse, NP  diazepam (VALIUM) 5 MG tablet Take 0.5 tablets (2.5 mg total) by mouth 2 (two) times daily as needed. For spasms 06/22/12  Yes Alycia Rossetti, MD  doxycycline (VIBRA-TABS) 100 MG tablet Take 100 mg by mouth 2 (two) times daily.   Yes Historical Provider, MD  gabapentin (NEURONTIN) 300 MG capsule Take 300 mg by mouth at bedtime.   Yes Historical Provider, MD  oxyCODONE-acetaminophen (ROXICET) 5-325 MG per tablet 1 tablet  three times a day prn pain 06/22/12  Yes Alycia Rossetti, MD  QUESTRAN 4 GM/DOSE powder Take 0.5 packets (2 g total) by mouth 2 (two) times daily with a meal. 06/01/12  Yes Andria Meuse, NP    Allergies as of 06/24/2012 - Review Complete 06/12/2012  Allergen Reaction Noted  . Lovastatin Other (See Comments) 09/25/2010  . Nexium (esomeprazole magnesium) Diarrhea 06/12/2012     Family History  Problem Relation Age of Onset  . Colon cancer Neg Hx   . Colon polyps Neg Hx   . Hypothyroidism Mother   . Heart disease Father   . Hypothyroidism Daughter     History   Social History  . Marital Status: Divorced    Spouse Name: N/A    Number of Children: N/A  . Years of Education: N/A   Occupational History  . Not on file.   Social History Main Topics  . Smoking status: Current Some Day Smoker -- 0.00 packs/day for 30 years    Types: Cigarettes    Last Attempt to Quit: 03/29/2011  . Smokeless tobacco: Not on file  . Alcohol Use: No  . Drug Use: No  . Sexually Active: Yes    Birth Control/ Protection: Post-menopausal   Other Topics Concern  . Not on file   Social History Narrative  . No narrative on file    Review of Systems: See HPI, otherwise negative ROS   Physical Exam: BP 137/90  Pulse 75  Temp(Src) 98.1 F (36.7 C) (Oral)  Resp 20  SpO2 98% General:   Alert,  pleasant and cooperative in NAD Head:  Normocephalic and atraumatic. Neck:  Supple; Lungs:  Clear throughout to auscultation.    Heart:  Regular rate and rhythm. Abdomen:  Soft, nontender and nondistended. Normal bowel sounds, without guarding, and without rebound.   Neurologic:  Alert and  oriented x4;  grossly normal neurologically.  Impression/Plan:     DYSPHAGIA/abnl ESOPHAGUS ON CT  PLAN:  EGD/DIL TODAY

## 2012-06-30 NOTE — Telephone Encounter (Signed)
Referral has been faxed to [facility] and they will contact patient with date & times

## 2012-06-30 NOTE — Transfer of Care (Signed)
Immediate Anesthesia Transfer of Care Note  Patient: [name] [name]  Procedure(s) Performed: Procedure(s) with comments: ESOPHAGOGASTRODUODENOSCOPY (EGD) WITH PROPOFOL (N/A) SAVORY DILATION (N/A) - 12.33m , 161m 1536mIOPSY (N/A) - Gastric and Esophageal Biopsies  Patient Location: PACU  Anesthesia Type:MAC  Level of Consciousness: awake, alert  and oriented  Airway & Oxygen Therapy: Patient Spontanous Breathing and Patient connected to face mask oxygen  Post-op Assessment: Report given to PACU RN and Post -op Vital signs reviewed and stable  Post vital signs: Reviewed and stable  Complications: No apparent anesthesia complications

## 2012-06-30 NOTE — Anesthesia Postprocedure Evaluation (Signed)
  Anesthesia Post-op Note  Patient: [name] [name]  Procedure(s) Performed: Procedure(s) with comments: ESOPHAGOGASTRODUODENOSCOPY (EGD) WITH PROPOFOL (N/A) SAVORY DILATION (N/A) - 12.23m , 115m 1543mIOPSY (N/A) - Gastric and Esophageal Biopsies  Patient Location: PACU  Anesthesia Type:MAC  Level of Consciousness: awake, alert  and oriented  Airway and Oxygen Therapy: Patient Spontanous Breathing  Post-op Pain: none  Post-op Assessment: Post-op Vital signs reviewed, Patient's Cardiovascular Status Stable, Respiratory Function Stable, Patent Airway, No signs of Nausea or vomiting, Adequate PO intake, Pain level controlled, No headache, No backache, No residual numbness and No residual motor weakness  Post-op Vital Signs: Reviewed and stable  Complications: No apparent anesthesia complications

## 2012-06-30 NOTE — Anesthesia Preprocedure Evaluation (Addendum)
Anesthesia Evaluation  Patient identified by MRN, date of birth, ID band Patient awake    Reviewed: Allergy & Precautions, H&P , NPO status , Patient's Chart, lab work & pertinent test results  History of Anesthesia Complications (+) PONV  Airway Mallampati: II    Mouth opening: Limited Mouth Opening  Dental  (+) Poor Dentition   Pulmonary shortness of breath, asthma , sleep apnea , COPD COPD inhaler, former smoker,  breath sounds clear to auscultation        Cardiovascular hypertension, Pt. on medications Rhythm:Regular Rate:Normal     Neuro/Psych  Headaches, PSYCHIATRIC DISORDERS Anxiety Depression    GI/Hepatic PUD, GERD- (esoph stricture)  Medicated,  Endo/Other    Renal/GU      Musculoskeletal   Abdominal   Peds  Hematology   Anesthesia Other Findings   Reproductive/Obstetrics                          Anesthesia Physical Anesthesia Plan  ASA: III  Anesthesia Plan: MAC   Post-op Pain Management:    Induction: Intravenous  Airway Management Planned: Simple Face Mask  Additional Equipment:   Intra-op Plan:   Post-operative Plan:   Informed Consent: I have reviewed the patients History and Physical, chart, labs and discussed the procedure including the risks, benefits and alternatives for the proposed anesthesia with the patient or authorized representative who has indicated his/her understanding and acceptance.     Plan Discussed with:   Anesthesia Plan Comments:         Anesthesia Quick Evaluation

## 2012-06-30 NOTE — Preoperative (Signed)
Beta Blockers   Reason not to administer Beta Blockers:Not Applicable 

## 2012-06-30 NOTE — Op Note (Signed)
[facility] [address] [place], [address]   ENDOSCOPY PROCEDURE REPORT  PATIENT: [name] [name], [name] [name]  MR#: [id number] BIRTHDATE: 1956-04-13 , 56  yrs. old GENDER: Female  ENDOSCOPIST: Barney Drain, MD REFFERED HC:WCBJSEG Shelley, M.D. PROCEDURE DATE:  06/30/2012 PROCEDURE:   EGD with biopsy and EGD with dilatation over guidewire   INDICATIONS:1.  dysphagia.   2.  BARIUM PILL ESOPHAGRAM 2010: NL, BPE FEB 2014; SEVERE ESOPHAGEAL MOTILITY DISORDER, 13 M PILL PASSED W/O DIFFICULTY, THICKENED ESOPHAGEAL WALL. AFTER LAST EGD/DIL PT PRSETED WITH CHEST PAIN.  MEDICATIONS: MAC sedation, administered by CRNA TOPICAL ANESTHETIC: Cetacaine Spray DESCRIPTION OF PROCEDURE:   After the risks benefits and alternatives of the procedure were thoroughly explained, informed consent was obtained.  The     endoscope was introduced through the mouth and advanced to the second portion of the duodenum. The instrument was slowly withdrawn as the mucosa was carefully examined.  Prior to withdrawal of the scope, the guidwire was placed.  The esophagus was dilated successfully.  The patient was recovered in endoscopy and discharged home in satisfactory condition.   ESOPHAGUS: NO DEFINTE STRICTURE APPRECIATED.  DISTAL ESOPHAGUS DID NOT RELAX COMPLETELY SUGGESTING ?WEB.  EMPIRIC DILATION PERFORMED, OTHERWISE NL ESOPHAGUS.   A small hiatal hernia was noted. STOMACH: Moderate gastritis (inflammation) was found in the gastric antrum.  Multiple biopsies were performed using cold forceps. DUODENUM: The duodenal mucosa showed no abnormalities in the bulb and second portion of the duodenum.  Dilation was then performed at the distal esophagus  Dilator: Savary over guidewire Size(s): 12.8-16 MM Resistance: minimal Heme: yes NO CHANGE IN RESISTANCE AS DILATORS INCREASED IN SIZE  COMPLICATIONS: There were no complications.  ENDOSCOPIC IMPRESSION: 1.   NO DEFINTE STRICTURE APPRECIATED. 2.    Small hiatal hernia 3.   Gastritis  RECOMMENDATIONS: FOLLOW A SOFT MECHANICAL/LOW FAT DIET.  MEATS NEED TO BE CHOPPED OR GROUND. CONTINUE DEXILANT. BIOPSY WILL BE BACK IN 7 DAYS.  REFERRING TO [facility] FOR ESOPHAGEAL MANOMETRY & TO SEE GI MD FOR ESOPHAGEAL MOTILITY DISORDER  FOLLOW UP IN 4 MOS.      _______________________________ Lorrin MaisBarney Drain, MD 06/30/2012 10:36 AM      PATIENT NAME:  [name] [name], [name] [name] MR#: [id number]

## 2012-06-30 NOTE — Telephone Encounter (Signed)
Message copied by Feliberto Gottron on Tue Jun 30, 2012  9:57 AM ------      Message from: Danie Binder      Created: Tue Jun 30, 2012  9:33 AM       NEEDS REFERRAL FOR ESOPHAGEAL MANOMETRY AT [facility] AND THEN AN APPT TO SEE TO SEE A GI DOC AT [facility]  FOR ESOPHAGEAL MOTILITY DISORDER/DYSPAHAGIA ASAP.  ------

## 2012-07-01 ENCOUNTER — Encounter (HOSPITAL_COMMUNITY): Payer: Self-pay | Admitting: Gastroenterology

## 2012-07-02 ENCOUNTER — Telehealth: Payer: Self-pay | Admitting: Gastroenterology

## 2012-07-02 NOTE — Telephone Encounter (Signed)
Called and informed pt.  

## 2012-07-02 NOTE — Telephone Encounter (Signed)
Please call pt. HER stomach Bx shows gastritis. THE ESOPHAGUS BIOPSIES ARE NORMAL.   EAT 4 TO 6 SMALL MEALS DAILY.  FOLLOW A SOFT MECHANICAL/LOW FAT DIET. MEATS NEED TO BE CHOPPED OR GROUND.  CONTINUE DEXILANT.  FOLLOW UP WITH [facility] FOR A WEAK ESOPHAGUS AND THE ESOPHAGUS PRESSURE TEST.  FOLLOW UP IN 4 MOS E30.

## 2012-07-02 NOTE — Telephone Encounter (Signed)
PLEASE CALL PT.  She should continue Dexliant for reflux . It may cause her chest pain. Soft mechanical diet.if she doesn't follow her diet she will have chest pain and burping. SHE SHOULD STAY U[RIGHT FOR 30 MINS AFTER MEALS OR EATING SOMETHING.

## 2012-07-02 NOTE — Telephone Encounter (Signed)
Path faxed to PCP, recall made

## 2012-07-02 NOTE — Telephone Encounter (Signed)
Called and informed pt of results. She said she has had a lot of air since her procedure. She burps and passes gas a lot. She had chest pain very bad last night and she thinks it was from the Gabapentine and Dexilant. She has not had a BM since procedure. Vomited X 1 but no nausea now. She thinks she should not take those two pills for a couple of days. Please advise!

## 2012-07-03 NOTE — Telephone Encounter (Signed)
Reminder in epic to follow up in 4 months

## 2012-07-07 ENCOUNTER — Telehealth: Payer: Self-pay | Admitting: Family Medicine

## 2012-07-16 NOTE — Telephone Encounter (Signed)
Patient needs ov.

## 2012-07-20 ENCOUNTER — Telehealth: Payer: Self-pay | Admitting: Family Medicine

## 2012-07-20 ENCOUNTER — Other Ambulatory Visit: Payer: Self-pay

## 2012-07-20 MED ORDER — OXYCODONE-ACETAMINOPHEN 5-325 MG PO TABS: ORAL_TABLET | ORAL | Status: DC

## 2012-07-20 NOTE — Telephone Encounter (Signed)
Patient aware that rx ready for pick up  

## 2012-07-22 DIAGNOSIS — M9981 Other biomechanical lesions of cervical region: Secondary | ICD-10-CM | POA: Diagnosis not present

## 2012-07-22 DIAGNOSIS — M545 Low back pain: Secondary | ICD-10-CM | POA: Diagnosis not present

## 2012-07-22 DIAGNOSIS — M5430 Sciatica, unspecified side: Secondary | ICD-10-CM | POA: Diagnosis not present

## 2012-07-22 DIAGNOSIS — M999 Biomechanical lesion, unspecified: Secondary | ICD-10-CM | POA: Diagnosis not present

## 2012-08-03 ENCOUNTER — Other Ambulatory Visit: Payer: Self-pay | Admitting: Family Medicine

## 2012-08-03 ENCOUNTER — Encounter: Payer: Self-pay | Admitting: Family Medicine

## 2012-08-03 ENCOUNTER — Ambulatory Visit (HOSPITAL_COMMUNITY)
Admission: RE | Admit: 2012-08-03 | Discharge: 2012-08-03 | Disposition: A | Discharge status: Home / Self Care | Payer: Medicare Other | Source: Ambulatory Visit | Attending: Family Medicine | Admitting: Family Medicine

## 2012-08-03 ENCOUNTER — Ambulatory Visit (INDEPENDENT_AMBULATORY_CARE_PROVIDER_SITE_OTHER): Payer: Medicare Other | Admitting: Family Medicine

## 2012-08-03 VITALS — BP 122/84 | HR 101 | Resp 16 | Wt 116.0 lb

## 2012-08-03 DIAGNOSIS — R109 Unspecified abdominal pain: Secondary | ICD-10-CM

## 2012-08-03 DIAGNOSIS — R404 Transient alteration of awareness: Secondary | ICD-10-CM | POA: Diagnosis not present

## 2012-08-03 DIAGNOSIS — R933 Abnormal findings on diagnostic imaging of other parts of digestive tract: Secondary | ICD-10-CM | POA: Diagnosis not present

## 2012-08-03 DIAGNOSIS — K5730 Diverticulosis of large intestine without perforation or abscess without bleeding: Secondary | ICD-10-CM | POA: Insufficient documentation

## 2012-08-03 DIAGNOSIS — B370 Candidal stomatitis: Secondary | ICD-10-CM | POA: Diagnosis not present

## 2012-08-03 DIAGNOSIS — R198 Other specified symptoms and signs involving the digestive system and abdomen: Secondary | ICD-10-CM | POA: Diagnosis not present

## 2012-08-03 DIAGNOSIS — K5090 Crohn's disease, unspecified, without complications: Secondary | ICD-10-CM | POA: Diagnosis not present

## 2012-08-03 LAB — CBC WITH DIFFERENTIAL/PLATELET
Basophils Absolute: 0 10*3/uL (ref 0.0–0.1)
Eosinophils Relative: 1 % (ref 0–5)
HCT: 49.1 % — ABNORMAL HIGH (ref 36.0–46.0)
Hemoglobin: 17.4 g/dL — ABNORMAL HIGH (ref 12.0–15.0)
Lymphocytes Relative: 23 % (ref 12–46)
Lymphs Abs: 1.4 10*3/uL (ref 0.7–4.0)
MCV: 100 fL (ref 78.0–100.0)
Monocytes Absolute: 0.5 10*3/uL (ref 0.1–1.0)
Monocytes Relative: 8 % (ref 3–12)
RDW: 13.8 % (ref 11.5–15.5)
WBC: 6 10*3/uL (ref 4.0–10.5)

## 2012-08-03 LAB — BASIC METABOLIC PANEL
CO2: 30 mEq/L (ref 19–32)
Glucose, Bld: 91 mg/dL (ref 70–99)
Potassium: 3.4 mEq/L — ABNORMAL LOW (ref 3.5–5.3)
Sodium: 137 mEq/L (ref 135–145)

## 2012-08-03 MED ORDER — NYSTATIN 100000 UNIT/ML MT SUSP: 500000.0000 [IU] | Freq: Four times a day (QID) | OROMUCOSAL | Status: DC

## 2012-08-03 MED ORDER — PROMETHAZINE HCL 12.5 MG PO TABS: 12.5000 mg | ORAL_TABLET | Freq: Four times a day (QID) | ORAL | Status: DC | PRN

## 2012-08-03 MED ORDER — ONDANSETRON 4 MG PO TBDP: 4.0000 mg | ORAL_TABLET | Freq: Three times a day (TID) | ORAL | Status: DC | PRN

## 2012-08-03 NOTE — Patient Instructions (Addendum)
Get the labs done today CT scan to be done Start zofran for nausea Start the nystatin until the thrush clears F/U as previous

## 2012-08-04 ENCOUNTER — Telehealth: Payer: Self-pay | Admitting: Family Medicine

## 2012-08-04 NOTE — Telephone Encounter (Signed)
Patient aware.

## 2012-08-05 ENCOUNTER — Encounter: Payer: Self-pay | Admitting: Family Medicine

## 2012-08-05 DIAGNOSIS — R109 Unspecified abdominal pain: Secondary | ICD-10-CM | POA: Insufficient documentation

## 2012-08-05 DIAGNOSIS — B370 Candidal stomatitis: Secondary | ICD-10-CM | POA: Insufficient documentation

## 2012-08-05 NOTE — Assessment & Plan Note (Signed)
On inhalers for COPD will treat with nystatin swish and swallow she tends to use Dukes mouth wash but this has not helped

## 2012-08-05 NOTE — Progress Notes (Signed)
  Subjective:    Patient ID: [name] [name], female    DOB: Sep 02, 1955, 57 y.o.   MRN: [id number]  HPI Pt presents with abdominal pain and cramping which started 2 days ago, pain mostly in upper part of abdomen, associated with nausea and some vomiting mostly dry heaving. She has had a few episodes of diarrhea but not out the ordinary. History of chronic abdominal pain, Crohns disease in surgical remission.  She also noticed white stuff on tongue and feels raw when she swallows, unable to keep dexilant down  Review of Systems  GEN- denies fatigue, fever, weight loss,weakness, recent illness HEENT- denies eye drainage, change in vision, nasal discharge, CVS- denies chest pain, palpitations RESP- denies SOB, cough, wheeze ABD- + N/V, change in stools, +abd pain GU- denies dysuria, hematuria, dribbling, incontinence Neuro- denies headache, dizziness, syncope, seizure activity      Objective:   Physical Exam GEN- NAD, alert and oriented x3, non toxic appearing HEENT- PERRL, EOMI, non injected sclera, pink conjunctiva, , white plaques on tongue, slightly dry MM Neck- Supple, no LAD CVS- mild tachycardia HR 100 resting, no murmur RESP-CTAB ABD-NABS,soft,TTP diffusely s, no rebound, no mass felt EXT- No edema Pulses- Radial 2+        Assessment & Plan:

## 2012-08-05 NOTE — Assessment & Plan Note (Signed)
Labs unremarkable CT scan shows focal area of inflammation/edema, discussed with her GI who knows her quite well, we will hold and see if this declares itself, no acute abdomen, treat symptoms, hydrate anti-emetics

## 2012-08-05 NOTE — Assessment & Plan Note (Signed)
Possible signs of crohns on scan, see above will watch and wait

## 2012-08-10 ENCOUNTER — Other Ambulatory Visit: Payer: Self-pay | Admitting: Urgent Care

## 2012-08-17 ENCOUNTER — Telehealth: Payer: Self-pay | Admitting: Family Medicine

## 2012-08-17 MED ORDER — OXYCODONE-ACETAMINOPHEN 5-325 MG PO TABS: ORAL_TABLET | ORAL | Status: DC

## 2012-08-17 NOTE — Telephone Encounter (Signed)
Can collect on the 16th

## 2012-08-19 DIAGNOSIS — M999 Biomechanical lesion, unspecified: Secondary | ICD-10-CM | POA: Diagnosis not present

## 2012-08-19 DIAGNOSIS — M5430 Sciatica, unspecified side: Secondary | ICD-10-CM | POA: Diagnosis not present

## 2012-08-19 DIAGNOSIS — M545 Low back pain: Secondary | ICD-10-CM | POA: Diagnosis not present

## 2012-08-20 DIAGNOSIS — K220 Achalasia of cardia: Secondary | ICD-10-CM | POA: Diagnosis not present

## 2012-08-20 DIAGNOSIS — R1310 Dysphagia, unspecified: Secondary | ICD-10-CM | POA: Diagnosis not present

## 2012-09-08 ENCOUNTER — Encounter: Payer: Self-pay | Admitting: Family Medicine

## 2012-09-08 ENCOUNTER — Ambulatory Visit (INDEPENDENT_AMBULATORY_CARE_PROVIDER_SITE_OTHER): Payer: Medicare Other | Admitting: Family Medicine

## 2012-09-08 VITALS — BP 120/72 | HR 98 | Resp 18 | Ht 61.0 in | Wt 118.0 lb

## 2012-09-08 DIAGNOSIS — D45 Polycythemia vera: Secondary | ICD-10-CM

## 2012-09-08 DIAGNOSIS — J449 Chronic obstructive pulmonary disease, unspecified: Secondary | ICD-10-CM | POA: Diagnosis not present

## 2012-09-08 DIAGNOSIS — I10 Essential (primary) hypertension: Secondary | ICD-10-CM

## 2012-09-08 DIAGNOSIS — M79609 Pain in unspecified limb: Secondary | ICD-10-CM

## 2012-09-08 DIAGNOSIS — M79671 Pain in right foot: Secondary | ICD-10-CM

## 2012-09-08 DIAGNOSIS — J4489 Other specified chronic obstructive pulmonary disease: Secondary | ICD-10-CM

## 2012-09-08 DIAGNOSIS — D751 Secondary polycythemia: Secondary | ICD-10-CM | POA: Insufficient documentation

## 2012-09-08 NOTE — Progress Notes (Signed)
  Subjective:    Patient ID: [name] [name], female    DOB: [birth year], 57 y.o.   MRN: [id number]  HPI Patient here to follow chronic medical problems. She's complaining of bilateral toe pain with burning sensation for the past 2 weeks currently she feels swelling and pain in her right foot in the second and third digits no specific injury. Medications reviewed she's only using Symbicort once a day she's had some increased wheeze due to the allergies, no change in cough     Review of Systems - Per above  GEN- denies fatigue, fever, weight loss,weakness, recent illness HEENT- denies eye drainage, change in vision, nasal discharge, CVS- denies chest pain, palpitations RESP- denies SOB, cough, +wheeze ABD- denies N/V, change in stools, abd pain GU- denies dysuria, hematuria, dribbling, incontinence MSK- + joint pain, muscle aches, injury Neuro- denies headache, dizziness, syncope, seizure activity      Objective:   Physical Exam  GEN- NAD, alert and oriented x3, well appearing HEENT- PERRL, EOMI, non injected sclera, pink conjunctiva, MMM, oropharynx clear Neck- Supple, no LAD CVS- RRR, no murmur RESP-bilateral scattered wheeze, good air movement, no rhonchi EXT- No edema, Right foot, minimal swelling of 2nd digit, no open lesions, pain with flexion of toes Neuro- decreased monofilament over right foot 2nd and 3rd digits, otherwise normal bilat Pulses- Radial, DP- 2+       Assessment & Plan:

## 2012-09-08 NOTE — Assessment & Plan Note (Signed)
Her current symptoms are likely set off by the allergens. She declines having an allergy medication she'll use Benadryl as needed but understands that uses with her Valium. She would increase her Symbicort to twice a day dosing

## 2012-09-08 NOTE — Assessment & Plan Note (Signed)
She is very minimal swelling this is not classic of gout she is no sign of infection. Question if she has some neuropathy however it only been 2 weeks. I will have her use Epsom salt soaks for pain and swelling and we will increase her gabapentin to 300 mg twice a day she can continue her chronic pain medications

## 2012-09-08 NOTE — Patient Instructions (Addendum)
Take gabapentin 1 tablet twice a day for the nerve pain in feet Increase symbicort to 2 puffs twice a day  Continue albuterol as needed Soak feet in epsom salt  Get the repeat bloodwork today F/u 3  Months [place]

## 2012-09-08 NOTE — Assessment & Plan Note (Signed)
Blood pressure well controlled

## 2012-09-08 NOTE — Assessment & Plan Note (Signed)
Her last CBC showed concern for polycythemia hemoglobin was elevated to 17 she denies any recent tobacco use however has long-standing history of tobacco abuse as well as COPD. We will recheck this today

## 2012-09-10 NOTE — Progress Notes (Signed)
FEB 2014 BPE ESO MOT DISORDER --->EGD DIL NL ESO Bx GASTRITIS  REVIEWED.

## 2012-09-11 ENCOUNTER — Other Ambulatory Visit: Payer: Self-pay

## 2012-09-11 MED ORDER — OXYCODONE-ACETAMINOPHEN 5-325 MG PO TABS: ORAL_TABLET | ORAL | Status: DC

## 2012-09-14 ENCOUNTER — Other Ambulatory Visit: Payer: Self-pay | Admitting: Family Medicine

## 2012-09-14 DIAGNOSIS — I10 Essential (primary) hypertension: Secondary | ICD-10-CM | POA: Diagnosis not present

## 2012-09-14 LAB — CBC WITH DIFFERENTIAL/PLATELET
Basophils Relative: 1 % (ref 0–1)
Eosinophils Absolute: 0.1 10*3/uL (ref 0.0–0.7)
Eosinophils Relative: 2 % (ref 0–5)
HCT: 43 % (ref 36.0–46.0)
Hemoglobin: 15.2 g/dL — ABNORMAL HIGH (ref 12.0–15.0)
Lymphs Abs: 1 10*3/uL (ref 0.7–4.0)
MCH: 34.6 pg — ABNORMAL HIGH (ref 26.0–34.0)
MCHC: 35.3 g/dL (ref 30.0–36.0)
MCV: 97.9 fL (ref 78.0–100.0)
Monocytes Absolute: 0.5 10*3/uL (ref 0.1–1.0)
Monocytes Relative: 11 % (ref 3–12)
RBC: 4.39 MIL/uL (ref 3.87–5.11)

## 2012-09-15 ENCOUNTER — Telehealth: Payer: Self-pay | Admitting: Family Medicine

## 2012-09-15 ENCOUNTER — Other Ambulatory Visit: Payer: Self-pay

## 2012-09-15 MED ORDER — DIAZEPAM 5 MG PO TABS: 2.5000 mg | ORAL_TABLET | Freq: Two times a day (BID) | ORAL | Status: DC | PRN

## 2012-09-15 NOTE — Telephone Encounter (Signed)
Patient aware its ready to be collected

## 2012-09-16 DIAGNOSIS — M5030 Other cervical disc degeneration, unspecified cervical region: Secondary | ICD-10-CM | POA: Diagnosis not present

## 2012-09-16 DIAGNOSIS — M9981 Other biomechanical lesions of cervical region: Secondary | ICD-10-CM | POA: Diagnosis not present

## 2012-09-16 DIAGNOSIS — M999 Biomechanical lesion, unspecified: Secondary | ICD-10-CM | POA: Diagnosis not present

## 2012-09-16 DIAGNOSIS — M546 Pain in thoracic spine: Secondary | ICD-10-CM | POA: Diagnosis not present

## 2012-09-23 ENCOUNTER — Encounter: Payer: Self-pay | Admitting: Family Medicine

## 2012-09-23 DIAGNOSIS — M999 Biomechanical lesion, unspecified: Secondary | ICD-10-CM | POA: Diagnosis not present

## 2012-09-23 DIAGNOSIS — M545 Low back pain: Secondary | ICD-10-CM | POA: Diagnosis not present

## 2012-09-23 DIAGNOSIS — M5430 Sciatica, unspecified side: Secondary | ICD-10-CM | POA: Diagnosis not present

## 2012-10-06 NOTE — Progress Notes (Signed)
FEB 2014 EGD/DIL GASTRITIS OPV JUN 2014 E 30 DYSPHAGIA  REVIEWED.

## 2012-10-06 NOTE — Progress Notes (Signed)
Cc PCP 

## 2012-10-07 ENCOUNTER — Telehealth: Payer: Self-pay | Admitting: Family Medicine

## 2012-10-07 DIAGNOSIS — M999 Biomechanical lesion, unspecified: Secondary | ICD-10-CM | POA: Diagnosis not present

## 2012-10-07 DIAGNOSIS — M546 Pain in thoracic spine: Secondary | ICD-10-CM | POA: Diagnosis not present

## 2012-10-07 DIAGNOSIS — M5030 Other cervical disc degeneration, unspecified cervical region: Secondary | ICD-10-CM | POA: Diagnosis not present

## 2012-10-07 DIAGNOSIS — M9981 Other biomechanical lesions of cervical region: Secondary | ICD-10-CM | POA: Diagnosis not present

## 2012-10-07 MED ORDER — BUDESONIDE-FORMOTEROL FUMARATE 160-4.5 MCG/ACT IN AERO: 2.0000 | INHALATION_SPRAY | Freq: Two times a day (BID) | RESPIRATORY_TRACT | Status: DC

## 2012-10-07 MED ORDER — ALBUTEROL SULFATE HFA 108 (90 BASE) MCG/ACT IN AERS: 2.0000 | INHALATION_SPRAY | Freq: Four times a day (QID) | RESPIRATORY_TRACT | Status: DC | PRN

## 2012-10-07 MED ORDER — HYDROCODONE-ACETAMINOPHEN 10-325 MG PO TABS: 1.0000 | ORAL_TABLET | Freq: Four times a day (QID) | ORAL | Status: DC | PRN

## 2012-10-07 NOTE — Telephone Encounter (Signed)
Ok to refill Gabapentin? Also pt wants to know if you can give her vicodin instead of percocet d/t does not feel like driving to pick up rx

## 2012-10-07 NOTE — Telephone Encounter (Signed)
Noted and left message via vm

## 2012-10-07 NOTE — Telephone Encounter (Signed)
Med rf

## 2012-10-07 NOTE — Telephone Encounter (Signed)
She can be changed to hydrocodone 10-31m, however I will not be able to switch her back,she will have to keep with the hydrocodone

## 2012-10-09 ENCOUNTER — Ambulatory Visit (INDEPENDENT_AMBULATORY_CARE_PROVIDER_SITE_OTHER): Payer: Medicare Other | Admitting: Family Medicine

## 2012-10-09 ENCOUNTER — Ambulatory Visit (HOSPITAL_COMMUNITY)
Admission: RE | Admit: 2012-10-09 | Discharge: 2012-10-09 | Disposition: A | Discharge status: Home / Self Care | Payer: Medicare Other | Source: Ambulatory Visit | Attending: Family Medicine | Admitting: Family Medicine

## 2012-10-09 VITALS — BP 90/70 | HR 78 | Temp 98.0°F | Resp 18 | Ht <= 58 in | Wt 122.0 lb

## 2012-10-09 DIAGNOSIS — M79609 Pain in unspecified limb: Secondary | ICD-10-CM | POA: Insufficient documentation

## 2012-10-09 DIAGNOSIS — M79671 Pain in right foot: Secondary | ICD-10-CM

## 2012-10-09 DIAGNOSIS — R209 Unspecified disturbances of skin sensation: Secondary | ICD-10-CM | POA: Diagnosis not present

## 2012-10-09 DIAGNOSIS — B370 Candidal stomatitis: Secondary | ICD-10-CM | POA: Diagnosis not present

## 2012-10-09 DIAGNOSIS — M25571 Pain in right ankle and joints of right foot: Secondary | ICD-10-CM

## 2012-10-09 DIAGNOSIS — R202 Paresthesia of skin: Secondary | ICD-10-CM

## 2012-10-09 DIAGNOSIS — M25579 Pain in unspecified ankle and joints of unspecified foot: Secondary | ICD-10-CM

## 2012-10-09 MED ORDER — NYSTATIN 100000 UNIT/ML MT SUSP: 500000.0000 [IU] | Freq: Four times a day (QID) | OROMUCOSAL | Status: DC

## 2012-10-09 NOTE — Progress Notes (Signed)
  Subjective:    Patient ID: [name] [name], female    DOB: 27-Dec-1955, 57 y.o.   MRN: [id number]  HPI  Patient here with continued right foot pain. She continues to have pain and swelling around her second and third digits she has pain when she walks for long periods of time she also notices that she gets an occasional red streak up her right foot and states her toes will turn blue at times as well. She's been using Epsom soaks which helped as well as her pain medication. She cannot remember any particular injury however is not improving. I did increase her gabapentin at the last visit secondary to tingling and burning sensation in that area as well which did help those symptoms. She also noticed that she had an enlarged vein running across the medial aspect of her ankle last week after with walking around on her foot.  She's also concerned about tingling on her tongue and white plaques that she noticed similar to her previous thrush. She is using her Symbicort twice a day and rinsing her mouth. Denies sore throat cough  Review of Systems -per above  GEN- denies fatigue, fever, weight loss,weakness, recent illness HEENT- denies eye drainage, change in vision, nasal discharge, CVS- denies chest pain, palpitations RESP- denies SOB, cough, wheeze MSK- + joint pain, muscle aches, injury      Objective:   Physical Exam GEN- NAD, alert and oriented x3, well appearing HEENT- PERRL, EOMI, non injected sclera, pink conjunctiva, MMM, oropharynx few white plaques on tongue, no posterior oropharyngeal lesions EXT-  Right foot, minimal swelling of 2nd digit, TTP around 2nd 3rd digits no open lesions, pain with flexion of toes,  Right ankle- normal ROM, small vein running across medial malleous no tortuosity,no erythema, NT  Neuro- decreased monofilament over right foot 2nd and 3rd digits, otherwise normal bilat Pulses- Radial, DP- 2+       Assessment & Plan:

## 2012-10-09 NOTE — Patient Instructions (Signed)
Start the Aspercreme Use the nystatin swish and swallow  Xray of foot Referral to foot doctor

## 2012-10-10 DIAGNOSIS — M79609 Pain in unspecified limb: Secondary | ICD-10-CM | POA: Insufficient documentation

## 2012-10-10 NOTE — Assessment & Plan Note (Signed)
I see no red streaking and the vein noted has no signs of thombus or superficial inflammation Continue gabapentin, add aspercreme, podiatry per above

## 2012-10-10 NOTE — Assessment & Plan Note (Signed)
Very mild, use nystatin as she has had worse cases in the past

## 2012-10-10 NOTE — Assessment & Plan Note (Signed)
I will have her see podiatry for evaluation, xray negative  I doubt infection  Pain has improved with neurontin. Add topical NSAID , she has some inflammation in around the metatarsals, unclear why so much pain with flexion, no specific injury

## 2012-10-13 ENCOUNTER — Telehealth: Payer: Self-pay | Admitting: Family Medicine

## 2012-10-13 MED ORDER — GABAPENTIN 300 MG PO CAPS: 300.0000 mg | ORAL_CAPSULE | Freq: Three times a day (TID) | ORAL | Status: DC

## 2012-10-13 MED ORDER — ALBUTEROL SULFATE HFA 108 (90 BASE) MCG/ACT IN AERS: 2.0000 | INHALATION_SPRAY | RESPIRATORY_TRACT | Status: DC | PRN

## 2012-10-13 NOTE — Telephone Encounter (Signed)
meds were refilled today

## 2012-10-13 NOTE — Telephone Encounter (Signed)
Ok to rf? 

## 2012-10-14 ENCOUNTER — Telehealth: Payer: Self-pay | Admitting: Family Medicine

## 2012-10-14 NOTE — Telephone Encounter (Signed)
Mailed out a copy of surgeries to pts address

## 2012-10-21 ENCOUNTER — Encounter: Payer: Self-pay | Admitting: Gastroenterology

## 2012-10-26 ENCOUNTER — Encounter: Payer: Self-pay | Admitting: Gastroenterology

## 2012-10-26 NOTE — Progress Notes (Signed)
Pt is aware of OV on 7/30 at 11 with SF and appt card was mailed

## 2012-10-29 ENCOUNTER — Telehealth: Payer: Self-pay | Admitting: Family Medicine

## 2012-10-30 DIAGNOSIS — G5760 Lesion of plantar nerve, unspecified lower limb: Secondary | ICD-10-CM | POA: Diagnosis not present

## 2012-10-30 DIAGNOSIS — M79609 Pain in unspecified limb: Secondary | ICD-10-CM | POA: Diagnosis not present

## 2012-10-30 MED ORDER — HYDROCODONE-ACETAMINOPHEN 10-325 MG PO TABS: 1.0000 | ORAL_TABLET | Freq: Four times a day (QID) | ORAL | Status: DC | PRN

## 2012-10-30 NOTE — Telephone Encounter (Signed)
Ok to refill 

## 2012-10-30 NOTE — Telephone Encounter (Signed)
She can pick up next week

## 2012-10-30 NOTE — Telephone Encounter (Signed)
Med printed out and pt aware to pick up next week

## 2012-11-04 DIAGNOSIS — M999 Biomechanical lesion, unspecified: Secondary | ICD-10-CM | POA: Diagnosis not present

## 2012-11-04 DIAGNOSIS — M545 Low back pain: Secondary | ICD-10-CM | POA: Diagnosis not present

## 2012-11-04 DIAGNOSIS — M5430 Sciatica, unspecified side: Secondary | ICD-10-CM | POA: Diagnosis not present

## 2012-11-11 ENCOUNTER — Telehealth: Payer: Self-pay | Admitting: Family Medicine

## 2012-11-11 NOTE — Telephone Encounter (Signed)
No answer at this time/ss

## 2012-11-12 ENCOUNTER — Emergency Department (HOSPITAL_COMMUNITY)
Admission: EM | Admit: 2012-11-12 | Discharge: 2012-11-12 | Disposition: A | Discharge status: Home / Self Care | Payer: Medicare Other | Attending: Emergency Medicine | Admitting: Emergency Medicine

## 2012-11-12 ENCOUNTER — Encounter (HOSPITAL_COMMUNITY): Payer: Self-pay

## 2012-11-12 DIAGNOSIS — J449 Chronic obstructive pulmonary disease, unspecified: Secondary | ICD-10-CM | POA: Diagnosis not present

## 2012-11-12 DIAGNOSIS — Z79899 Other long term (current) drug therapy: Secondary | ICD-10-CM | POA: Diagnosis not present

## 2012-11-12 DIAGNOSIS — L089 Local infection of the skin and subcutaneous tissue, unspecified: Secondary | ICD-10-CM

## 2012-11-12 DIAGNOSIS — Y939 Activity, unspecified: Secondary | ICD-10-CM | POA: Insufficient documentation

## 2012-11-12 DIAGNOSIS — I10 Essential (primary) hypertension: Secondary | ICD-10-CM | POA: Insufficient documentation

## 2012-11-12 DIAGNOSIS — Z8669 Personal history of other diseases of the nervous system and sense organs: Secondary | ICD-10-CM | POA: Diagnosis not present

## 2012-11-12 DIAGNOSIS — R21 Rash and other nonspecific skin eruption: Secondary | ICD-10-CM | POA: Diagnosis not present

## 2012-11-12 DIAGNOSIS — J4489 Other specified chronic obstructive pulmonary disease: Secondary | ICD-10-CM | POA: Insufficient documentation

## 2012-11-12 DIAGNOSIS — Z8719 Personal history of other diseases of the digestive system: Secondary | ICD-10-CM | POA: Diagnosis not present

## 2012-11-12 DIAGNOSIS — Z8679 Personal history of other diseases of the circulatory system: Secondary | ICD-10-CM | POA: Diagnosis not present

## 2012-11-12 DIAGNOSIS — S20169A Insect bite (nonvenomous) of breast, unspecified breast, initial encounter: Secondary | ICD-10-CM | POA: Diagnosis not present

## 2012-11-12 DIAGNOSIS — Z8639 Personal history of other endocrine, nutritional and metabolic disease: Secondary | ICD-10-CM | POA: Insufficient documentation

## 2012-11-12 DIAGNOSIS — Z862 Personal history of diseases of the blood and blood-forming organs and certain disorders involving the immune mechanism: Secondary | ICD-10-CM | POA: Insufficient documentation

## 2012-11-12 DIAGNOSIS — K219 Gastro-esophageal reflux disease without esophagitis: Secondary | ICD-10-CM | POA: Insufficient documentation

## 2012-11-12 DIAGNOSIS — S30860A Insect bite (nonvenomous) of lower back and pelvis, initial encounter: Secondary | ICD-10-CM | POA: Diagnosis not present

## 2012-11-12 DIAGNOSIS — W57XXXA Bitten or stung by nonvenomous insect and other nonvenomous arthropods, initial encounter: Secondary | ICD-10-CM | POA: Insufficient documentation

## 2012-11-12 DIAGNOSIS — Y929 Unspecified place or not applicable: Secondary | ICD-10-CM | POA: Insufficient documentation

## 2012-11-12 DIAGNOSIS — Z87891 Personal history of nicotine dependence: Secondary | ICD-10-CM | POA: Insufficient documentation

## 2012-11-12 DIAGNOSIS — F411 Generalized anxiety disorder: Secondary | ICD-10-CM | POA: Insufficient documentation

## 2012-11-12 DIAGNOSIS — L539 Erythematous condition, unspecified: Secondary | ICD-10-CM | POA: Insufficient documentation

## 2012-11-12 DIAGNOSIS — Z8709 Personal history of other diseases of the respiratory system: Secondary | ICD-10-CM | POA: Diagnosis not present

## 2012-11-12 MED ORDER — DOXYCYCLINE HYCLATE 100 MG PO TABS
100.0000 mg | ORAL_TABLET | Freq: Once | ORAL | Status: AC
  Administered 2012-11-12: 100 mg via ORAL
  Filled 2012-11-12: qty 1

## 2012-11-12 MED ORDER — OXYCODONE-ACETAMINOPHEN 5-325 MG PO TABS: 1.0000 | ORAL_TABLET | ORAL | Status: DC | PRN

## 2012-11-12 MED ORDER — OXYCODONE-ACETAMINOPHEN 5-325 MG PO TABS
2.0000 | ORAL_TABLET | Freq: Once | ORAL | Status: AC
  Administered 2012-11-12: 2 via ORAL
  Filled 2012-11-12: qty 2

## 2012-11-12 MED ORDER — DOXYCYCLINE HYCLATE 100 MG PO CAPS: 100.0000 mg | ORAL_CAPSULE | Freq: Two times a day (BID) | ORAL | Status: DC

## 2012-11-12 MED ORDER — MUPIROCIN CALCIUM 2 % EX CREA: TOPICAL_CREAM | Freq: Three times a day (TID) | CUTANEOUS | Status: DC

## 2012-11-12 NOTE — Telephone Encounter (Signed)
Pt called back and pt ended up at ER with spider bite and allergic reaction. Hospital fu made for Monday.

## 2012-11-12 NOTE — ED Notes (Signed)
Pt states she was bit by a black bug two weeks ago. Complain of severe pain and burning under both breast. States she has tried everything to get the area to heal but it is getting worse.

## 2012-11-16 ENCOUNTER — Encounter: Payer: Self-pay | Admitting: Family Medicine

## 2012-11-16 ENCOUNTER — Ambulatory Visit (INDEPENDENT_AMBULATORY_CARE_PROVIDER_SITE_OTHER): Payer: Medicare Other | Admitting: Family Medicine

## 2012-11-16 VITALS — BP 130/80 | HR 80 | Temp 98.5°F | Resp 16 | Wt 123.0 lb

## 2012-11-16 DIAGNOSIS — Z5189 Encounter for other specified aftercare: Secondary | ICD-10-CM

## 2012-11-16 DIAGNOSIS — L089 Local infection of the skin and subcutaneous tissue, unspecified: Secondary | ICD-10-CM

## 2012-11-16 MED ORDER — MUPIROCIN CALCIUM 2 % EX CREA: TOPICAL_CREAM | Freq: Two times a day (BID) | CUTANEOUS | Status: DC

## 2012-11-16 NOTE — Patient Instructions (Addendum)
Continue current medications Start the topical cream bactroban twice a day for the next 10 days  Complete the doxycyline as prescribed Restart your hydrocodone  F/U as before

## 2012-11-16 NOTE — ED Provider Notes (Signed)
History    CSN: [id number] Arrival date & time 11/12/12  4174  First MD Initiated Contact with Patient 11/12/12 1043     Chief Complaint  Patient presents with  . Abscess   (Consider location/radiation/quality/duration/timing/severity/associated sxs/prior Treatment) HPI Comments: [name] [name] is a 57 y.o. Female presenting with pain, itching and burning along with a red rash at her chest wall beneath her left breast which has started now to spread, now involving the right breast as well.  She describes the inciting event being bit underneath her left breast about 2 weeks ago by some type of flying insect.  She denies drainage from the bite site.  She has taken hydrocodone without relief of her symptoms.  She denies fever or chills, myalgias, nausea or vomiting and denies other areas of rash.     The history is provided by the patient.   Past Medical History  Diagnosis Date  . Elevated liver enzymes 2009: BMI 24 ?Etoh ALT 94, AST 51,  NEG ANA, qIGs& ASMA    MAY 2012 AST 52 ALT 38  . Anastomotic ulcer JAN 2009  . Esophageal stricture 2009  . Diarrhea MULITIFACTORIAL    IBS, LACTOSE INTOLERANCE, SBBO, BILE-SALT  . Inflammatory bowel disease 2006 CD    SURGICAL REMISSION  . LBP (low back pain)   . Migraine   . Asthma   . Allergic rhinitis   . CTS (carpal tunnel syndrome)   . Hyperlipemia   . Anxiety   . Hemorrhoid   . Crohn disease   . BMI (body mass index) 20.0-29.9 2009 121 lbs  . 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 Shortness of breath     exertion and humidity  . GERD (gastroesophageal reflux disease)   . Hypertension   . Sleep apnea     Stop Bang Cock score of 4  . PONV (postoperative nausea and vomiting)    Past Surgical History  Procedure Laterality Date  . Hemicolectomy  RIGHT 2006  . Hernia repair    . Carpal tunnel release  LEFT  . Lumbar disc surgery    . Vocal cord surgery  Sep 20, 2010    precancerous areas removed  . Esophagogastroduodenoscopy   02/07/09    mild gastritis  . Colonoscopy  JAN 2009 DIARRHEA, ABD PAIN, BRBPR    ANASTOMOTIC ULCER(3MM), IH YC:[id number] ULCER, NL COLON bX  . Upper gastrointestinal endoscopy  JAN 2009 ABD PAIN    GASTRITIS, ESO RING  . Upper gastrointestinal endoscopy  OCT 2010 ABD PAIN, DYSPHAGIA    DIL 15MM, GASTRITIS, NL DUODENUM  . Upper gastrointestinal endoscopy  OCT 2010 DYSPHAGIA    DIL 17 MM, GASTRITIS, NL DUODENUM  . Dilation and curettage of uterus    . Bilateral salpingoophorectomy    . Wisdom tooth extraction      pin placement due to broken jaw  . Colon surgery    . Back surgery    . Esophageal dilation  12/24/2011    SLF: A stricture was found in the distal esophagus/ Moderate gastritis/ MILD Duodenitis  . Esophageal biopsy  12/24/2011    Procedure: BIOPSY;  Surgeon: Danie Binder, MD;  Location: [facility];  Service: Endoscopy;;  Gastric Biopsies  . Esophagogastroduodenoscopy (egd) with propofol N/A 06/30/2012    Procedure: ESOPHAGOGASTRODUODENOSCOPY (EGD) WITH PROPOFOL;  Surgeon: Danie Binder, MD;  Location: [facility];  Service: Endoscopy;  Laterality: N/A;  . Savory dilation N/A 06/30/2012    Procedure: SAVORY DILATION;  Surgeon: Danie Binder, MD;  Location: [facility];  Service: Endoscopy;  Laterality: N/A;  12.53m , 151m 1556m. Esophageal biopsy N/A 06/30/2012    Procedure: BIOPSY;  Surgeon: SanDanie BinderD;  Location: [facility];  Service: Endoscopy;  Laterality: N/A;  Gastric and Esophageal Biopsies   Family History  Problem Relation Age of Onset  . Colon cancer Neg Hx   . Colon polyps Neg Hx   . Hypothyroidism Mother   . Heart disease Father   . Hypothyroidism Daughter    History  Substance Use Topics  . Smoking status: Former Smoker -- 0.00 packs/day for 30 years    Types: Cigarettes    Quit date: 03/29/2011  . Smokeless tobacco: Not on file  . Alcohol Use: No   OB History   Grav Para Term Preterm Abortions TAB SAB Ect Mult Living                 Review of Systems   Constitutional: Negative for fever and chills.  HENT: Negative for facial swelling.   Respiratory: Negative for shortness of breath and wheezing.   Gastrointestinal: Negative for nausea.  Skin: Positive for rash.  Neurological: Negative for numbness.    Allergies  Lovastatin and Nexium  Home Medications   Current Outpatient Rx  Name  Route  Sig  Dispense  Refill  . albuterol (PROVENTIL HFA;VENTOLIN HFA) 108 (90 BASE) MCG/ACT inhaler   Inhalation   Inhale 2 puffs into the lungs every 4 (four) hours as needed for wheezing.   1 Inhaler   11     Dispense PRO-AIR,, ventolin not covered   . amLODipine (NORVASC) 5 MG tablet      TAKE 1 TABLET (5 MG TOTAL) BY MOUTH DAILY.   30 tablet   2   . budesonide-formoterol (SYMBICORT) 160-4.5 MCG/ACT inhaler   Inhalation   Inhale 2 puffs into the lungs 2 (two) times daily.   1 Inhaler   1   . calcium carbonate (TUMS - DOSED IN MG ELEMENTAL CALCIUM) 500 MG chewable tablet   Oral   Chew 1 tablet by mouth as needed. For heartburn         . cholestyramine (QUESTRAN) 4 GM/DOSE powder      TAKE 2 GRAMS BY MOUTH TWO TIMES DAILY WITH A MEAL   378 g   3     Dispense as written.   . dMarland Kitchenxlansoprazole (DEXILANT) 60 MG capsule   Oral   Take 1 capsule (60 mg total) by mouth daily.   31 capsule   2   . diazepam (VALIUM) 5 MG tablet   Oral   Take 0.5 tablets (2.5 mg total) by mouth 2 (two) times daily as needed. For spasms   30 tablet   2   . gabapentin (NEURONTIN) 300 MG capsule   Oral   Take 1 capsule (300 mg total) by mouth 3 (three) times daily.   90 capsule   6   . HYDROcodone-acetaminophen (NORCO) 10-325 MG per tablet   Oral   Take 1 tablet by mouth every 6 (six) hours as needed for pain.   70 tablet   0     DO NOT REFILL UNTIL 11/06/12   . nystatin (MYCOSTATIN) 100000 UNIT/ML suspension   Oral   Take 5 mLs (500,000 Units total) by mouth 4 (four) times daily.   120 mL   1   . quinapril-hydrochlorothiazide  (ACCURETIC) 10-12.5 MG per tablet   Oral  Take 1 tablet by mouth 2 (two) times daily.         Marland Kitchen doxycycline (VIBRAMYCIN) 100 MG capsule   Oral   Take 1 capsule (100 mg total) by mouth 2 (two) times daily.   28 capsule   0   . mupirocin cream (BACTROBAN) 2 %   Topical   Apply topically 3 (three) times daily.   15 g   0   . oxyCODONE-acetaminophen (PERCOCET/ROXICET) 5-325 MG per tablet   Oral   Take 1 tablet by mouth every 4 (four) hours as needed for pain.   20 tablet   0    BP 128/87  Pulse 91  Temp(Src) 98.6 F (37 C) (Oral)  Resp 23  Ht 5' (1.524 m)  Wt 117 lb (53.071 kg)  BMI 22.85 kg/m2  SpO2 95% Physical Exam  Constitutional: She appears well-developed and well-nourished. No distress.  HENT:  Head: Normocephalic.  Neck: Neck supple.  Cardiovascular: Normal rate.   Pulmonary/Chest: Effort normal. No respiratory distress. She has no wheezes.  Abdominal: Soft. There is no tenderness.  Musculoskeletal: Normal range of motion. She exhibits no edema.  Skin: Rash noted. There is erythema.  Small area of induration beneath left breast with central punctum,  Appears consistent with insect bite.  There is no drainage, no fluctuance from the site.  There is erythema,  Increased warmth along her chest wall beneath her left breast and trace of erythema under the right breast.  There are no satellite lesions, no excoriations, the skin is dry.    ED Course  Procedures (including critical care time) Labs Reviewed - No data to display No results found. 1. Nonvenomous insect bite of chest wall with infection, left, initial encounter     MDM  Pt with insect bite with surrounding erythema, no exam findings to suggest abscess.  No satellite lesions,  Dry rash - not consistent with candida.  Pt covered for probable infected insect bite/cellulitis.  Prescribed doxycycline,  Also topical mupirocin cream as pt was desirous of a topical med.  Oxycodone prescribed for pain.  Warm  compresses,  Keep the area dry. F/u with pcp or return here for worsened sx.  Evalee Jefferson, PA-C 11/16/12 1346

## 2012-11-16 NOTE — Progress Notes (Signed)
  Subjective:    Patient ID: [name] [name], female    DOB: 1956-01-09, 56 y.o.   MRN: [id number]  HPI  Patient seen in emergency room on July 10 secondary to spider bite beneath her left breast. She has significant swelling and redness has spread across bilateral breast. She states initially had a papule however that became more irritable medicine tender and in the skin broke open and it looked like it was "melting". In the emergency room she was prescribed Bactroban as well as doxycycline. She was given 20 tablets of Percocet. She ran out of the Bactroban and is asking for refill on this as well as Percocet. He denies any drainage but still has a burning sensation where the skin is healing up and needs her left breast. She denies any fever. She also has a spot on her right arm after hitting something she notices scab over and it is now red appear  Review of Systems - per above  GEN- denies fatigue, fever, weight loss,weakness, recent illness Skin- + rash      Objective:   Physical Exam GEN-NAD,alert and oriented x 3 Skin- dime size wound with granulation tissue beneath left breast, minimal erythema, no discharge no bleeding, TTP at wound site.  Right forearm small healed laceration with surrounding erythema and scab       Assessment & Plan:

## 2012-11-16 NOTE — ED Provider Notes (Signed)
Medical screening examination/treatment/procedure(s) were performed by non-physician practitioner and as supervising physician I was immediately available for consultation/collaboration.   Mervin Kung, MD 11/16/12 [phone]

## 2012-11-17 DIAGNOSIS — L089 Local infection of the skin and subcutaneous tissue, unspecified: Secondary | ICD-10-CM | POA: Insufficient documentation

## 2012-11-17 NOTE — Assessment & Plan Note (Signed)
This is quite possible this is due to spider bite. The good news is that she is are to starting to heal the cellulitis and was presents now resolved she now has a wound that is starting to close up. I will have her complete the oral antibiotics and topical. She asked for refill on Percocet about 5:30 she was given 20 tablets at the emergency room she is on a pain contract in her wound is healing therefore she can return to her hydrocodone 10 mg for pain

## 2012-11-23 ENCOUNTER — Telehealth: Payer: Self-pay | Admitting: Family Medicine

## 2012-11-23 MED ORDER — HYDROCODONE-ACETAMINOPHEN 10-325 MG PO TABS: 1.0000 | ORAL_TABLET | Freq: Four times a day (QID) | ORAL | Status: DC | PRN

## 2012-11-23 NOTE — Telephone Encounter (Signed)
You can call in but pt can not refill until 7/27

## 2012-11-23 NOTE — Telephone Encounter (Signed)
?   Ok to refill.last refill 10/30/12, last ov 11/16/12  r

## 2012-11-23 NOTE — Telephone Encounter (Signed)
Med refilled.

## 2012-11-27 DIAGNOSIS — M79609 Pain in unspecified limb: Secondary | ICD-10-CM | POA: Diagnosis not present

## 2012-11-27 DIAGNOSIS — G5760 Lesion of plantar nerve, unspecified lower limb: Secondary | ICD-10-CM | POA: Diagnosis not present

## 2012-12-02 ENCOUNTER — Ambulatory Visit (INDEPENDENT_AMBULATORY_CARE_PROVIDER_SITE_OTHER): Payer: Medicare Other | Admitting: Gastroenterology

## 2012-12-02 ENCOUNTER — Encounter: Payer: Self-pay | Admitting: Gastroenterology

## 2012-12-02 VITALS — BP 137/85 | HR 88 | Temp 97.2°F | Ht 62.0 in | Wt 118.8 lb

## 2012-12-02 DIAGNOSIS — K5090 Crohn's disease, unspecified, without complications: Secondary | ICD-10-CM | POA: Diagnosis not present

## 2012-12-02 DIAGNOSIS — R1310 Dysphagia, unspecified: Secondary | ICD-10-CM

## 2012-12-02 DIAGNOSIS — K219 Gastro-esophageal reflux disease without esophagitis: Secondary | ICD-10-CM

## 2012-12-02 MED ORDER — DEXLANSOPRAZOLE 60 MG PO CPDR: 60.0000 mg | DELAYED_RELEASE_CAPSULE | Freq: Every day | ORAL | Status: DC

## 2012-12-02 NOTE — Progress Notes (Signed)
Subjective:    Patient ID: [name] [name], female    DOB: 12-Dec-1955, 57 y.o.   MRN: [id number]  [name], MD  HPI SWALLOWING TEST WENT WELL EXCEPT SHE GAGS. BITTEN BY A BROWN RECLUSE SPIDER UNDER HER BREAST. BAD INFECTION AND STILL HAS THE BROWN CIRCLE/BITE SITE.  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 HAS GOOD DAY AND BAD DAYS WITH BOWELS. GOOD > BAD BUT DEPENDS ON WHAT SHE EATS. EATS FISH: BL[place]ILL. STOMACH UPSET WHEN SHE EATS BEEF. BMs: 4,5, OR 7. BMs: EVERY AM. SPIDER BITE NAUSEATED, NOW GONE.  PT DENIES FEVER, CHILLS, BRBPR, nausea, vomiting, melena, constipation, abd pain, problems swallowing. OFF AND ON heartburn or indigestion IF SHE EATS THE WRONG THING.  Past Medical History  Diagnosis Date  . Elevated liver enzymes 2009: BMI 24 ?Etoh ALT 94, AST 51,  NEG ANA, qIGs& ASMA    MAY 2012 AST 52 ALT 38  . Anastomotic ulcer JAN 2009  . Esophageal stricture 2009  . Diarrhea MULITIFACTORIAL    IBS, LACTOSE INTOLERANCE, SBBO, BILE-SALT  . Inflammatory bowel disease 2006 CD    SURGICAL REMISSION  . LBP (low back pain)   . Migraine   . Asthma   . Allergic rhinitis   . CTS (carpal tunnel syndrome)   . Hyperlipemia   . Anxiety   . Hemorrhoid   . Crohn disease   . BMI (body mass index) 20.0-29.9 2009 121 lbs  . 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 Shortness of breath     exertion and humidity  . GERD (gastroesophageal reflux disease)   . Hypertension   . Sleep apnea     Stop Bang Cock score of 4  . PONV (postoperative nausea and vomiting)     Past Surgical History  Procedure Laterality Date  . Hemicolectomy  RIGHT 2006  . Hernia repair    . Carpal tunnel release  LEFT  . Lumbar disc surgery    . Vocal cord surgery  Sep 20, 2010    precancerous areas removed  . Esophagogastroduodenoscopy  02/07/09    mild gastritis  . Colonoscopy  JAN 2009 DIARRHEA, ABD PAIN, BRBPR    ANASTOMOTIC ULCER(3MM), IH MC:NOBSJG ULCER, NL COLON bX  . Upper gastrointestinal endoscopy  JAN 2009  ABD PAIN    GASTRITIS, ESO RING  . Upper gastrointestinal endoscopy  OCT 2010 ABD PAIN, DYSPHAGIA    DIL 15MM, GASTRITIS, NL DUODENUM  . Upper gastrointestinal endoscopy  OCT 2010 DYSPHAGIA    DIL 17 MM, GASTRITIS, NL DUODENUM  . Dilation and curettage of uterus    . Bilateral salpingoophorectomy    . Wisdom tooth extraction      pin placement due to broken jaw  . Colon surgery    . Back surgery    . Esophageal dilation  12/24/2011    SLF: A stricture was found in the distal esophagus/ Moderate gastritis/ MILD Duodenitis  . Esophageal biopsy  12/24/2011    Procedure: BIOPSY;  Surgeon: Danie Binder, MD;  Location: [facility];  Service: Endoscopy;;  Gastric Biopsies  . Esophagogastroduodenoscopy (egd) with propofol N/A 06/30/2012    Procedure: ESOPHAGOGASTRODUODENOSCOPY (EGD) WITH PROPOFOL;  Surgeon: Danie Binder, MD;  Location: [facility];  Service: Endoscopy;  Laterality: N/A;  . Savory dilation N/A 06/30/2012    Procedure: SAVORY DILATION;  Surgeon: Danie Binder, MD;  Location: [facility];  Service: Endoscopy;  Laterality: N/A;  12.3m , 143m 1563m. Esophageal biopsy N/A 06/30/2012  Procedure: BIOPSY;  Surgeon: Danie Binder, MD;  Location: [facility];  Service: Endoscopy;  Laterality: N/A;  Gastric and Esophageal Biopsies    Allergies  Allergen Reactions  . Lovastatin Other (See Comments)    Chest pain  . Nexium (Esomeprazole Magnesium) Diarrhea    Abdominal pain    Current Outpatient Prescriptions  Medication Sig Dispense Refill  . albuterol (PROVENTIL HFA;VENTOLIN HFA) 108 (90 BASE) MCG/ACT inhaler Inhale 2 puffs into the lungs every 4 (four) hours as needed for wheezing.    Marland Kitchen amLODipine (NORVASC) 5 MG tablet TAKE 1 TABLET (5 MG TOTAL) BY MOUTH DAILY.    . budesonide-formoterol (SYMBICORT) 160-4.5 MCG/ACT inhaler Inhale 2 puffs into the lungs 2 (two) times daily.    . calcium carbonate (TUMS - DOSED IN MG ELEMENTAL CALCIUM) 500 MG chewable tablet Chew 1 tablet by mouth as needed. For  heartburn      . cholestyramine (QUESTRAN) 4 GM/DOSE powder TAKE 2 GRAMS BY MOUTH TWO TIMES DAILY WITH A MEAL JUST AT NIGHT   . dexlansoprazole (DEXILANT) 60 MG capsule Take 1 capsule (60 mg total) by mouth daily.    . diazepam (VALIUM) 5 MG tablet Take 0.5 tablets (2.5 mg total) by mouth 2 (two) times daily as needed. For spasms    . doxycycline (VIBRAMYCIN) 100 MG capsule Take 1 capsule (100 mg total) by mouth 2 (two) times daily.    Marland Kitchen gabapentin (NEURONTIN) 300 MG capsule Take 1 capsule (300 mg total) by mouth 3 (three) times daily.    Marland Kitchen HYDROcodone-acetaminophen (NORCO) 10-325 MG per tablet Take 1 tablet by mouth every 6 (six) hours as needed for pain.    . mupirocin cream (BACTROBAN) 2 % Apply topically 2 (two) times daily.    Marland Kitchen nystatin (MYCOSTATIN) 100000 UNIT/ML suspension Take 5 mLs (500,000 Units total) by mouth 4 (four) times daily.    . quinapril-hydrochlorothiazide (ACCURETIC) 10-12.5 MG per tablet Take 1 tablet by mouth 2 (two) times daily.    Marland Kitchen oxyCODONE-acetaminophen (PERCOCET/ROXICET) 5-325 MG per tablet Take 1 tablet by mouth every 4 (four) hours as needed for pain.        Review of Systems     Objective:   Physical Exam  Vitals reviewed. Constitutional: She is oriented to person, place, and time. No distress.  HENT:  Head: Normocephalic and atraumatic.  Mouth/Throat: Oropharynx is clear and moist. No oropharyngeal exudate.  Eyes: Pupils are equal, round, and reactive to light. No scleral icterus.  Neck: Normal range of motion. Neck supple.  Cardiovascular: Normal rate, regular rhythm and normal heart sounds.   Pulmonary/Chest: Effort normal and breath sounds normal. No respiratory distress.  Abdominal: Soft. Bowel sounds are normal. She exhibits no distension. There is tenderness.  MILD RUQ TTP   Musculoskeletal: She exhibits no edema.  Lymphadenopathy:    She has no cervical adenopathy.  Neurological: She is alert and oriented to person, place, and time.  NO   NEW FOCAL DEFICITS   Skin:  TWO EXCORIATED ON HER BACK, ONE HEALED SCAR ON ABDOMEN UNDER LEFT BREAST+          Assessment & Plan:

## 2012-12-02 NOTE — Assessment & Plan Note (Addendum)
SX CONTROLLED.  DEXILANT DAILY. REFILL X 1YEAR. OPV IN 6 MOS

## 2012-12-02 NOTE — Assessment & Plan Note (Signed)
SX CONTROLLED.  MONITOR FOR RECURRENT SYMPTOMS.

## 2012-12-02 NOTE — Patient Instructions (Signed)
EAT 4 TO 6 SMALL MEALS DAILY.   FOLLOW A SOFT MECHANICAL/LOW FAT DIET. MEATS NEED TO BE CHOPPED OR GROUND.   CONTINUE DEXILANT.   FOLLOW UP IN 6 MOS.Marland Kitchen

## 2012-12-02 NOTE — Assessment & Plan Note (Signed)
SX CONTROLLED.  SOFT MECHANICAL DIET CONTINUE DIET MODIFICATION. OPV IN 6 MOS

## 2012-12-04 NOTE — Progress Notes (Signed)
Reminder in epic °

## 2012-12-09 ENCOUNTER — Encounter: Payer: Self-pay | Admitting: Family Medicine

## 2012-12-09 ENCOUNTER — Ambulatory Visit (INDEPENDENT_AMBULATORY_CARE_PROVIDER_SITE_OTHER): Payer: Medicare Other | Admitting: Family Medicine

## 2012-12-09 VITALS — BP 118/86 | HR 68 | Temp 98.0°F | Resp 16 | Wt 116.0 lb

## 2012-12-09 DIAGNOSIS — L089 Local infection of the skin and subcutaneous tissue, unspecified: Secondary | ICD-10-CM

## 2012-12-09 DIAGNOSIS — J449 Chronic obstructive pulmonary disease, unspecified: Secondary | ICD-10-CM

## 2012-12-09 DIAGNOSIS — Z5189 Encounter for other specified aftercare: Secondary | ICD-10-CM

## 2012-12-09 DIAGNOSIS — I10 Essential (primary) hypertension: Secondary | ICD-10-CM | POA: Diagnosis not present

## 2012-12-09 DIAGNOSIS — S20162D Insect bite (nonvenomous) of breast, left breast, subsequent encounter: Secondary | ICD-10-CM

## 2012-12-09 DIAGNOSIS — E785 Hyperlipidemia, unspecified: Secondary | ICD-10-CM

## 2012-12-09 MED ORDER — TIOTROPIUM BROMIDE MONOHYDRATE 18 MCG IN CAPS: 18.0000 ug | ORAL_CAPSULE | Freq: Every day | RESPIRATORY_TRACT | Status: DC

## 2012-12-09 MED ORDER — AMLODIPINE BESYLATE 5 MG PO TABS: ORAL_TABLET | ORAL | Status: DC

## 2012-12-09 MED ORDER — QUINAPRIL-HYDROCHLOROTHIAZIDE 10-12.5 MG PO TABS: 1.0000 | ORAL_TABLET | Freq: Two times a day (BID) | ORAL | Status: DC

## 2012-12-09 NOTE — Assessment & Plan Note (Signed)
I will try her on Spiriva for her COPD as she's had difficulties with the inhaled corticosteroids.

## 2012-12-09 NOTE — Patient Instructions (Addendum)
Try the spiriva for your COPD Get the labs done fasting Blood pressure looks good Keep using cream on back twice a day  F/U 4 months

## 2012-12-09 NOTE — Assessment & Plan Note (Signed)
This area is much improved. She still has a couple bites on her back we'll have her continue using the topical antibiotic cream for this. She can stop using the cream beneath her breasts as the skin is now healed over

## 2012-12-09 NOTE — Assessment & Plan Note (Signed)
Plan to recheck her cholesterol panel she is currently on Questran

## 2012-12-09 NOTE — Progress Notes (Signed)
  Subjective:    Patient ID: [name] [name], female    DOB: [birth year], 57 y.o.   MRN: [id number]  HPI  Patient here follow chronic medical problems. She was seen by podiatry diagnosed with a neuroma which she's had steroid injections for. COPD she's currently using Symbicort but will typically only use it once a day because it causes a burning sensation in the back of her throat and some mild pain. She rarely uses her albuterol. She still has passive smoke exposure from her husband. Hypertension tolerating her blood pressure medications. She is due for fasting labs for her cholesterol is well-appearing Crohn's disease and chronic abdominal pain and she was recently seen by her gastroenterologist note was reviewed no changes were made.  Spider bite- she's completed antibiotics she still using topical antibiotic cream she also has 2 bites on her back that she will like me to look at today. Is not noticed any drainage. She states she has had some muscle cramping and fatigue which she thinks is associated with a spider bites but overall feels much better   Review of Systems  GEN- denies fatigue, fever, weight loss,weakness, recent illness HEENT- denies eye drainage, change in vision, nasal discharge, CVS- denies chest pain, palpitations RESP- denies SOB, cough, wheeze ABD- denies N/V, change in stools, abd pain GU- denies dysuria, hematuria, dribbling, incontinence MSK- denies joint pain, muscle aches, injury Neuro- denies headache, dizziness, syncope, seizure activity      Objective:   Physical Exam GEN- NAD, alert and oriented x3 HEENT- PERRL, EOMI, non injected sclera, pink conjunctiva, MMM, oropharynx clear Neck- Supple, no lAD CVS- RRR, no murmur RESP-CTAB EXT- No edema Pulses- Radial, DP- 2+ Skin- beneath left breast- skin healing well, no erythema, small scab, back- 2 small erythematous maculopapular lesions with crusting., 1 inch lesion of erythema and scabbing left upper back,  no fluctuance no drainage        Assessment & Plan:

## 2012-12-09 NOTE — Progress Notes (Signed)
Cc PCP 

## 2012-12-09 NOTE — Assessment & Plan Note (Signed)
Blood pressure well controlled medications refill

## 2012-12-11 DIAGNOSIS — M5430 Sciatica, unspecified side: Secondary | ICD-10-CM | POA: Diagnosis not present

## 2012-12-11 DIAGNOSIS — I10 Essential (primary) hypertension: Secondary | ICD-10-CM | POA: Diagnosis not present

## 2012-12-11 DIAGNOSIS — E785 Hyperlipidemia, unspecified: Secondary | ICD-10-CM | POA: Diagnosis not present

## 2012-12-11 DIAGNOSIS — M545 Low back pain: Secondary | ICD-10-CM | POA: Diagnosis not present

## 2012-12-11 DIAGNOSIS — M999 Biomechanical lesion, unspecified: Secondary | ICD-10-CM | POA: Diagnosis not present

## 2012-12-11 LAB — COMPREHENSIVE METABOLIC PANEL
ALT: 53 U/L — ABNORMAL HIGH (ref 0–35)
AST: 51 U/L — ABNORMAL HIGH (ref 0–37)
Albumin: 4 g/dL (ref 3.5–5.2)
Alkaline Phosphatase: 106 U/L (ref 39–117)
BUN: 9 mg/dL (ref 6–23)
Calcium: 9.5 mg/dL (ref 8.4–10.5)
Chloride: 102 mEq/L (ref 96–112)
Potassium: 3.9 mEq/L (ref 3.5–5.3)

## 2012-12-11 LAB — CBC
HCT: 43.1 % (ref 36.0–46.0)
Hemoglobin: 15.6 g/dL — ABNORMAL HIGH (ref 12.0–15.0)
MCH: 36.3 pg — ABNORMAL HIGH (ref 26.0–34.0)
MCHC: 36.2 g/dL — ABNORMAL HIGH (ref 30.0–36.0)
RDW: 13.3 % (ref 11.5–15.5)

## 2012-12-11 LAB — LIPID PANEL
LDL Cholesterol: 92 mg/dL (ref 0–99)
Total CHOL/HDL Ratio: 1.7 Ratio
VLDL: 11 mg/dL (ref 0–40)

## 2012-12-14 ENCOUNTER — Telehealth: Payer: Self-pay | Admitting: Family Medicine

## 2012-12-14 NOTE — Telephone Encounter (Signed)
She is not due for refill, can only get every 30 days

## 2012-12-14 NOTE — Telephone Encounter (Signed)
DENIED refill to early

## 2012-12-14 NOTE — Telephone Encounter (Signed)
Ok to refill 

## 2012-12-14 NOTE — Telephone Encounter (Signed)
Hyrdocodone/Apap 10/325 mg tab 1 q6 hours prn pain last rf 11/23/12

## 2012-12-15 ENCOUNTER — Telehealth: Payer: Self-pay | Admitting: Family Medicine

## 2012-12-15 DIAGNOSIS — M549 Dorsalgia, unspecified: Secondary | ICD-10-CM

## 2012-12-15 DIAGNOSIS — IMO0002 Reserved for concepts with insufficient information to code with codable children: Secondary | ICD-10-CM

## 2012-12-15 DIAGNOSIS — G8929 Other chronic pain: Secondary | ICD-10-CM

## 2012-12-15 NOTE — Progress Notes (Signed)
REVIEWED.  Elevated liver enzymes. COMPLETE W/U IN THE PAST. LEVELS < 2X ULN. PT DECLINED LIVER BIOPSY IN THE PAST. MONITOR LIVER ENZYMES. AVOID HEPATOTOXIC DRUGS

## 2012-12-15 NOTE — Telephone Encounter (Signed)
I have discussed this with her before, I will not change the script she knows her meds must last 30 days, she can not get until the 21st. On pain contract no further exceptions

## 2012-12-15 NOTE — Telephone Encounter (Signed)
Pt is stating that she is taking her hydrocodone 3 times a day one at breakfast, one at lunch and one before bed. I told her she told be taking them every 6 hrs AS NEEDED she said they are needed. I told her refill was denied because she just had them refilled on the 11/23/12 and should not need anymore until the 21st of August. She is upset that she can not get them until them and only has 1 to take tomorrow. She says she is not abusing them.

## 2012-12-15 NOTE — Telephone Encounter (Signed)
LMTRC

## 2012-12-16 NOTE — Telephone Encounter (Signed)
I spoke with patient regarding her multiple phone calls to the office about her pain medications. She's been treated for chronic abdominal pain and joint pains. I started her on Percocet in September of last year secondary to a wrist fracture in cyst. At that time she was on a pain contract with her gastroenterologist and was receiving hydrocodone 5/500 one tablet 3 times a day. As the difficulties with her arm and wrist continued I spoke with her gastroenterologist and we decided I would take over her pain contract and patient agreed. Was maintained on Percocet 5/325 she was getting 40 tablets a month this was then increased to 60 tablets a month. After a couple months she noted that sometimes she'll use 3 tablets a day he would run out early in the Percocet therefore I agreed to increase her one time to 70 tablets a month.  He then came in states she was unable to come in on a regular basis to keep pick up her prescription and wanted to return to hydrocodone. As she was up to Percocet 5 mg I decided to change her to hydrocodone 10 mg instead and 70 tablets were given. Note she was given an early refill on her pain medication last month she had prescription June 27 that she fell and then she felt it on July 21. During the same time she was in the emergency room for an acute spider bite in skin infection and was given Percocet which I did not hold as a violation on her pain contract. She then started calling this week on the 11th asking for pain medication refill states that she was running out. She was advised that her prescriptions are every 30 days and that she was almost 2 weeks early for her pain medication. I nurse relayed the information to her see the chart and she became very upset with this. She called her pharmacy multiple times and I confirmed this with [facility] she also called [place] back this morning stating that she is admitted complaint to the presents office because I would not fill her medications  early for her.  Discussed with Ms. Swetz that she's been on a pain contract and has been getting monthly prescription since   last year and that she asked for the change back to hydrocodone from her Percocet and I also advised her that I would continue giving her 70 tablets per month. She states that she needs to take them 3 times a day. I advised her she can not in any further increases on her medication and that she would have to wait until her 30 days is up which around the 20th of the 21st of this month to get her prescription. She was very upset about this. In the meantime she can go to the ER if she has severe pain. Advised her it is her patient right to file any claims and that does not affect my management, but I  can refer her to a pain clinic from now on out to manage her pain since she is not happy with her current care and the discussion was ended.  Note there are multiple calls and documentation in the chart where patient called in asking for meds too early or was rude on the phone.

## 2012-12-16 NOTE — Telephone Encounter (Signed)
Please call.

## 2012-12-16 NOTE — Addendum Note (Signed)
Addended by: [name] F on: 12/16/2012 05:57 PM   Modules accepted: Orders

## 2012-12-21 ENCOUNTER — Telehealth: Payer: Self-pay | Admitting: Family Medicine

## 2012-12-21 NOTE — Telephone Encounter (Signed)
Okay to refill? 

## 2012-12-21 NOTE — Telephone Encounter (Signed)
Diazepam 5 mg tab

## 2012-12-21 NOTE — Telephone Encounter (Signed)
Called pharmacy and it was filled on 11/23/12 - 30 days from there would be 12/23/12. Called in medication not to be filled until 12/23/12.

## 2012-12-21 NOTE — Telephone Encounter (Signed)
?   OK to Refill - The rx states not to refill until  11/29/12

## 2012-12-21 NOTE — Telephone Encounter (Signed)
Okay to call pharmacy and they can fill as long as 30 days from last fill

## 2012-12-21 NOTE — Telephone Encounter (Signed)
?   OK to Refill  

## 2012-12-22 ENCOUNTER — Telehealth: Payer: Self-pay | Admitting: Family Medicine

## 2012-12-22 MED ORDER — HYDROCODONE-ACETAMINOPHEN 10-325 MG PO TABS: 1.0000 | ORAL_TABLET | Freq: Four times a day (QID) | ORAL | Status: DC | PRN

## 2012-12-22 NOTE — Telephone Encounter (Signed)
I have already spoken with her in detail, she will have to pick up tomorrow

## 2012-12-22 NOTE — Telephone Encounter (Signed)
Rx called in and pt is aware.

## 2012-12-22 NOTE — Telephone Encounter (Signed)
Pt calling again insisting on refill of pain medication.  Told will be available for tomorrow.  She is insisting on today and going on and on about her pain.

## 2012-12-22 NOTE — Telephone Encounter (Signed)
Pt is very upset that her medication cannot be filled today. She says that today is 30 days and it looks like when the nurse called the pharmacy yesterday that 30 days is actually tomorrow. She said that she is in extreme pain and cannot take it anymore and that she cannot get in to see pain management until 9/12. She wants to talk to you about this and wants to know why we cannot refill it today.

## 2012-12-22 NOTE — Telephone Encounter (Signed)
Call pharmacy and let them fill,

## 2012-12-23 ENCOUNTER — Telehealth: Payer: Self-pay | Admitting: Family Medicine

## 2012-12-23 MED ORDER — DIAZEPAM 5 MG PO TABS: 2.5000 mg | ORAL_TABLET | Freq: Two times a day (BID) | ORAL | Status: DC | PRN

## 2012-12-23 NOTE — Telephone Encounter (Signed)
done

## 2012-12-23 NOTE — Telephone Encounter (Signed)
rx was printed and faxed to pharmacy

## 2012-12-23 NOTE — Telephone Encounter (Signed)
Pt aware.

## 2012-12-23 NOTE — Telephone Encounter (Signed)
Please tell her 1 tablet daily

## 2013-01-13 ENCOUNTER — Telehealth: Payer: Self-pay | Admitting: Family Medicine

## 2013-01-13 NOTE — Telephone Encounter (Signed)
Must be 30 days from last fill, check and see when last filled, I have been over this multiple times with the patient.

## 2013-01-13 NOTE — Telephone Encounter (Signed)
No refill until 9/18

## 2013-01-13 NOTE — Telephone Encounter (Signed)
Norco 10-325 mg tab 1 q6 hours prn pain last rf 12/22/12

## 2013-01-13 NOTE — Telephone Encounter (Signed)
?   OK to Refill  

## 2013-01-13 NOTE — Telephone Encounter (Signed)
Last refill 12/22/12

## 2013-01-14 ENCOUNTER — Telehealth: Payer: Self-pay | Admitting: Family Medicine

## 2013-01-14 NOTE — Telephone Encounter (Signed)
Pt called today around 245pm demanding her pain medication be refilled. I informed patient that she is on a pain contract and that her pain medication was not due to be refilled until Sept 19. She says she is aware of the pain contract but is wanting a refill, states she has appointment tomorrow for consultation with the pain center. Pt also states that Dr. Buelah Manis only gave her 70 pills when she should have 65. I told her Dr. Only gives what she needs, she states she is taking the medication 3 times a day one at breakfast, one at lunch and one before she goes to bed. I told her she only should be taking as she needs them. Pt was very rude when I told her that she can not have her pain meds until Sept 19.

## 2013-01-14 NOTE — Telephone Encounter (Signed)
Pt called and Ashely spoke to her and pt aware pt stated she should be getting 90 not 70 tablets and demands that she gets them.

## 2013-01-18 ENCOUNTER — Telehealth: Payer: Self-pay | Admitting: Family Medicine

## 2013-01-18 DIAGNOSIS — Z79899 Other long term (current) drug therapy: Secondary | ICD-10-CM | POA: Diagnosis not present

## 2013-01-18 DIAGNOSIS — G894 Chronic pain syndrome: Secondary | ICD-10-CM | POA: Diagnosis not present

## 2013-01-18 NOTE — Telephone Encounter (Signed)
Pt can not get refill until until 01/21/13 per Dr. Buelah Manis, She is on pain contract her last refill was 08 18/14

## 2013-01-20 ENCOUNTER — Telehealth: Payer: Self-pay | Admitting: Family Medicine

## 2013-01-20 MED ORDER — HYDROCODONE-ACETAMINOPHEN 10-325 MG PO TABS: 1.0000 | ORAL_TABLET | Freq: Four times a day (QID) | ORAL | Status: DC | PRN

## 2013-01-20 NOTE — Telephone Encounter (Signed)
Per Dr. Buelah Manis ok to refill, pt needs to come pick up prescription

## 2013-01-22 DIAGNOSIS — M79609 Pain in unspecified limb: Secondary | ICD-10-CM | POA: Diagnosis not present

## 2013-01-22 DIAGNOSIS — G5760 Lesion of plantar nerve, unspecified lower limb: Secondary | ICD-10-CM | POA: Diagnosis not present

## 2013-01-27 DIAGNOSIS — M546 Pain in thoracic spine: Secondary | ICD-10-CM | POA: Diagnosis not present

## 2013-01-27 DIAGNOSIS — M5030 Other cervical disc degeneration, unspecified cervical region: Secondary | ICD-10-CM | POA: Diagnosis not present

## 2013-01-27 DIAGNOSIS — M9981 Other biomechanical lesions of cervical region: Secondary | ICD-10-CM | POA: Diagnosis not present

## 2013-01-27 DIAGNOSIS — M999 Biomechanical lesion, unspecified: Secondary | ICD-10-CM | POA: Diagnosis not present

## 2013-01-28 DIAGNOSIS — G894 Chronic pain syndrome: Secondary | ICD-10-CM | POA: Diagnosis not present

## 2013-01-28 DIAGNOSIS — M542 Cervicalgia: Secondary | ICD-10-CM | POA: Diagnosis not present

## 2013-01-28 DIAGNOSIS — M549 Dorsalgia, unspecified: Secondary | ICD-10-CM | POA: Diagnosis not present

## 2013-01-28 DIAGNOSIS — K5090 Crohn's disease, unspecified, without complications: Secondary | ICD-10-CM | POA: Diagnosis not present

## 2013-01-29 DIAGNOSIS — G894 Chronic pain syndrome: Secondary | ICD-10-CM | POA: Diagnosis not present

## 2013-01-29 DIAGNOSIS — Z79899 Other long term (current) drug therapy: Secondary | ICD-10-CM | POA: Diagnosis not present

## 2013-02-03 DIAGNOSIS — M5430 Sciatica, unspecified side: Secondary | ICD-10-CM | POA: Diagnosis not present

## 2013-02-03 DIAGNOSIS — M999 Biomechanical lesion, unspecified: Secondary | ICD-10-CM | POA: Diagnosis not present

## 2013-02-03 DIAGNOSIS — M545 Low back pain: Secondary | ICD-10-CM | POA: Diagnosis not present

## 2013-02-11 DIAGNOSIS — G894 Chronic pain syndrome: Secondary | ICD-10-CM | POA: Diagnosis not present

## 2013-02-11 DIAGNOSIS — K589 Irritable bowel syndrome without diarrhea: Secondary | ICD-10-CM | POA: Diagnosis not present

## 2013-02-11 DIAGNOSIS — G4710 Hypersomnia, unspecified: Secondary | ICD-10-CM | POA: Diagnosis not present

## 2013-02-11 DIAGNOSIS — Z79899 Other long term (current) drug therapy: Secondary | ICD-10-CM | POA: Diagnosis not present

## 2013-02-12 ENCOUNTER — Other Ambulatory Visit: Payer: Self-pay

## 2013-02-12 DIAGNOSIS — Z79899 Other long term (current) drug therapy: Secondary | ICD-10-CM | POA: Diagnosis not present

## 2013-02-12 DIAGNOSIS — G894 Chronic pain syndrome: Secondary | ICD-10-CM | POA: Diagnosis not present

## 2013-02-12 DIAGNOSIS — M542 Cervicalgia: Secondary | ICD-10-CM | POA: Diagnosis not present

## 2013-02-12 DIAGNOSIS — M545 Low back pain: Secondary | ICD-10-CM | POA: Diagnosis not present

## 2013-02-12 MED ORDER — CHOLESTYRAMINE 4 GM/DOSE PO POWD: ORAL | Status: DC

## 2013-02-17 DIAGNOSIS — M999 Biomechanical lesion, unspecified: Secondary | ICD-10-CM | POA: Diagnosis not present

## 2013-02-17 DIAGNOSIS — M5430 Sciatica, unspecified side: Secondary | ICD-10-CM | POA: Diagnosis not present

## 2013-02-17 DIAGNOSIS — M545 Low back pain: Secondary | ICD-10-CM | POA: Diagnosis not present

## 2013-02-22 ENCOUNTER — Telehealth: Payer: Self-pay | Admitting: Family Medicine

## 2013-02-22 MED ORDER — DIAZEPAM 5 MG PO TABS: 2.5000 mg | ORAL_TABLET | Freq: Two times a day (BID) | ORAL | Status: DC | PRN

## 2013-02-22 NOTE — Telephone Encounter (Signed)
Valium 5 mg 1/2 tab BID PRN  #30   Last refill 02/10/13!!  OK refill?? Rx 12/23/12 for #30 + 2 add refills.  Does not appear to be taking only 1/2 tab!!

## 2013-02-22 NOTE — Telephone Encounter (Signed)
Okay to give #30, 1 refill

## 2013-02-22 NOTE — Telephone Encounter (Signed)
rx #30 no refills called in

## 2013-03-15 DIAGNOSIS — R0602 Shortness of breath: Secondary | ICD-10-CM | POA: Diagnosis not present

## 2013-03-15 DIAGNOSIS — G894 Chronic pain syndrome: Secondary | ICD-10-CM | POA: Diagnosis not present

## 2013-03-15 DIAGNOSIS — G4700 Insomnia, unspecified: Secondary | ICD-10-CM | POA: Diagnosis not present

## 2013-03-15 DIAGNOSIS — Z79899 Other long term (current) drug therapy: Secondary | ICD-10-CM | POA: Diagnosis not present

## 2013-03-17 DIAGNOSIS — M5430 Sciatica, unspecified side: Secondary | ICD-10-CM | POA: Diagnosis not present

## 2013-03-17 DIAGNOSIS — M545 Low back pain: Secondary | ICD-10-CM | POA: Diagnosis not present

## 2013-03-17 DIAGNOSIS — M999 Biomechanical lesion, unspecified: Secondary | ICD-10-CM | POA: Diagnosis not present

## 2013-03-19 DIAGNOSIS — M25539 Pain in unspecified wrist: Secondary | ICD-10-CM | POA: Diagnosis not present

## 2013-03-22 DIAGNOSIS — G894 Chronic pain syndrome: Secondary | ICD-10-CM | POA: Diagnosis not present

## 2013-03-22 DIAGNOSIS — Z79899 Other long term (current) drug therapy: Secondary | ICD-10-CM | POA: Diagnosis not present

## 2013-03-22 DIAGNOSIS — G4700 Insomnia, unspecified: Secondary | ICD-10-CM | POA: Diagnosis not present

## 2013-03-23 DIAGNOSIS — M79609 Pain in unspecified limb: Secondary | ICD-10-CM | POA: Diagnosis not present

## 2013-03-23 DIAGNOSIS — G5760 Lesion of plantar nerve, unspecified lower limb: Secondary | ICD-10-CM | POA: Diagnosis not present

## 2013-03-25 ENCOUNTER — Other Ambulatory Visit: Payer: Self-pay | Admitting: Family Medicine

## 2013-03-25 NOTE — Telephone Encounter (Signed)
Okay to refill,

## 2013-03-25 NOTE — Telephone Encounter (Signed)
?   Ok to refill, last refill 02/22/13

## 2013-03-26 NOTE — Telephone Encounter (Signed)
Med phoned in °

## 2013-04-12 ENCOUNTER — Encounter: Payer: Self-pay | Admitting: Family Medicine

## 2013-04-12 ENCOUNTER — Ambulatory Visit (INDEPENDENT_AMBULATORY_CARE_PROVIDER_SITE_OTHER): Payer: Medicare Other | Admitting: Family Medicine

## 2013-04-12 VITALS — BP 130/70 | HR 68 | Temp 98.1°F | Resp 18 | Ht 60.0 in | Wt 119.0 lb

## 2013-04-12 DIAGNOSIS — L219 Seborrheic dermatitis, unspecified: Secondary | ICD-10-CM | POA: Insufficient documentation

## 2013-04-12 DIAGNOSIS — R7401 Elevation of levels of liver transaminase levels: Secondary | ICD-10-CM

## 2013-04-12 DIAGNOSIS — R7402 Elevation of levels of lactic acid dehydrogenase (LDH): Secondary | ICD-10-CM

## 2013-04-12 DIAGNOSIS — E785 Hyperlipidemia, unspecified: Secondary | ICD-10-CM

## 2013-04-12 DIAGNOSIS — Z5189 Encounter for other specified aftercare: Secondary | ICD-10-CM

## 2013-04-12 DIAGNOSIS — G8929 Other chronic pain: Secondary | ICD-10-CM

## 2013-04-12 DIAGNOSIS — L089 Local infection of the skin and subcutaneous tissue, unspecified: Secondary | ICD-10-CM

## 2013-04-12 DIAGNOSIS — J441 Chronic obstructive pulmonary disease with (acute) exacerbation: Secondary | ICD-10-CM

## 2013-04-12 DIAGNOSIS — Z23 Encounter for immunization: Secondary | ICD-10-CM | POA: Diagnosis not present

## 2013-04-12 DIAGNOSIS — L218 Other seborrheic dermatitis: Secondary | ICD-10-CM

## 2013-04-12 DIAGNOSIS — I10 Essential (primary) hypertension: Secondary | ICD-10-CM

## 2013-04-12 LAB — CBC WITH DIFFERENTIAL/PLATELET
Basophils Absolute: 0 10*3/uL (ref 0.0–0.1)
Basophils Relative: 1 % (ref 0–1)
Hemoglobin: 15.4 g/dL — ABNORMAL HIGH (ref 12.0–15.0)
MCHC: 35.1 g/dL (ref 30.0–36.0)
Neutro Abs: 5.7 10*3/uL (ref 1.7–7.7)
Neutrophils Relative %: 80 % — ABNORMAL HIGH (ref 43–77)
RDW: 13.5 % (ref 11.5–15.5)

## 2013-04-12 LAB — COMPREHENSIVE METABOLIC PANEL
ALT: 37 U/L — ABNORMAL HIGH (ref 0–35)
AST: 53 U/L — ABNORMAL HIGH (ref 0–37)
Albumin: 3.9 g/dL (ref 3.5–5.2)
BUN: 6 mg/dL (ref 6–23)
CO2: 31 mEq/L (ref 19–32)
Calcium: 9.5 mg/dL (ref 8.4–10.5)
Chloride: 100 mEq/L (ref 96–112)
Potassium: 4.1 mEq/L (ref 3.5–5.3)

## 2013-04-12 LAB — LIPID PANEL
Cholesterol: 225 mg/dL — ABNORMAL HIGH (ref 0–200)
LDL Cholesterol: 80 mg/dL (ref 0–99)
Total CHOL/HDL Ratio: 1.7 Ratio
Triglycerides: 43 mg/dL (ref <150)

## 2013-04-12 MED ORDER — KETOCONAZOLE 2 % EX SHAM: 1.0000 "application " | MEDICATED_SHAMPOO | CUTANEOUS | Status: DC

## 2013-04-12 MED ORDER — DOXYCYCLINE HYCLATE 100 MG PO TABS: 100.0000 mg | ORAL_TABLET | Freq: Two times a day (BID) | ORAL | Status: DC

## 2013-04-12 MED ORDER — NYSTATIN 100000 UNIT/ML MT SUSP: 500000.0000 [IU] | Freq: Four times a day (QID) | OROMUCOSAL | Status: DC

## 2013-04-12 MED ORDER — PREDNISONE 20 MG PO TABS: ORAL_TABLET | ORAL | Status: DC

## 2013-04-12 NOTE — Assessment & Plan Note (Signed)
I see no leftover evidence of infection. I'm not sure about the hepatitis C being spray by spider bites. She does have elevated LFTs she was checked for hepatitis C about 2 years ago we will recheck this today as her LFTs are persistently elevated

## 2013-04-12 NOTE — Progress Notes (Signed)
   Subjective:    Patient ID: [name] [name], female    DOB: 10-Mar-1956, 57 y.o.   MRN: [id number]  HPI Patient here to followup chronic medical problems. She has multiple concerns. She is history of COPD continues to smoke. She's had cough with production of yellow-green sputum for the past 3 weeks. She's been using her inhalers. She's also concerned about the development of thrush   She noticed some flaking and peeling in her scalp and thought that she may have had lice because she was itching all over therefore she used over-the-counter lice treatment.  Chronic pain she's currently in a pain management but is not happy with her current provider she states that she is awake many hours in the office and asked if I would take over her pain management again.   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 She will like to have her hepatitis C level checked     Review of Systems  GEN- denies fatigue, fever, weight loss,weakness, recent illness HEENT- denies eye drainage, change in vision, nasal discharge, CVS- denies chest pain, palpitations RESP- denies SOB, +cough, +wheeze ABD- denies N/V, change in stools, abd pain GU- denies dysuria, hematuria, dribbling, incontinence MSK-+ joint pain, muscle aches, injury Neuro- denies headache, dizziness, syncope, seizure activity      Objective:   Physical Exam GEN- NAD, alert and oriented x3 HEENT- PERRL, EOMI, non injected sclera, pink conjunctiva, MMM, oropharynx clear Neck- Supple, no lAD CVS- RRR, no murmur RESP-bilateral wheeze scattered, course rhonchi, no rales, normal WOB EXT- No edema Pulses- Radial, DP- 2+ Skin- beneath left breast- skin healing well, no erythema, scalp erythema and flaking, no lice or nits seen       Assessment & Plan:

## 2013-04-12 NOTE — Assessment & Plan Note (Signed)
I see no signs of lice today. I would treat her for seborrheic dermatitis with the dryness and erythema, Nizoral shampoo given twice a week

## 2013-04-12 NOTE — Assessment & Plan Note (Signed)
Chronic transaminitis. She has declined liver biopsy multiple times in the past Hep C and labs will be repeated just for surveillance

## 2013-04-12 NOTE — Assessment & Plan Note (Signed)
Was treated for exacerbation with prednisone as well as antibiotics she will continue her inhalers She needs to quit smoking

## 2013-04-12 NOTE — Assessment & Plan Note (Signed)
I've advised her that she is now on a pain clinic and I will no longer take override her pain prescriptions. She's appointment with her pain physician tomorrow she can be referred to a different pain physician h but will have to stay at the present one until she is excepted by a new clinic

## 2013-04-12 NOTE — Assessment & Plan Note (Signed)
Blood pressure well controlled

## 2013-04-12 NOTE — Patient Instructions (Signed)
Take antibiotics and prednisone Use the mucinex Continue all other medications We will send letter if labs are normal Flu shot given Medicated shampoo given F/U 4 months

## 2013-04-13 ENCOUNTER — Encounter: Payer: Self-pay | Admitting: *Deleted

## 2013-04-13 DIAGNOSIS — R7401 Elevation of levels of liver transaminase levels: Secondary | ICD-10-CM | POA: Diagnosis not present

## 2013-04-13 DIAGNOSIS — J441 Chronic obstructive pulmonary disease with (acute) exacerbation: Secondary | ICD-10-CM | POA: Diagnosis not present

## 2013-04-13 DIAGNOSIS — Z23 Encounter for immunization: Secondary | ICD-10-CM | POA: Diagnosis not present

## 2013-04-13 DIAGNOSIS — M545 Low back pain: Secondary | ICD-10-CM | POA: Diagnosis not present

## 2013-04-13 DIAGNOSIS — M542 Cervicalgia: Secondary | ICD-10-CM | POA: Diagnosis not present

## 2013-04-13 DIAGNOSIS — Z79899 Other long term (current) drug therapy: Secondary | ICD-10-CM | POA: Diagnosis not present

## 2013-04-13 DIAGNOSIS — E785 Hyperlipidemia, unspecified: Secondary | ICD-10-CM | POA: Diagnosis not present

## 2013-04-13 DIAGNOSIS — Z5189 Encounter for other specified aftercare: Secondary | ICD-10-CM | POA: Diagnosis not present

## 2013-04-13 DIAGNOSIS — G894 Chronic pain syndrome: Secondary | ICD-10-CM | POA: Diagnosis not present

## 2013-04-13 NOTE — Addendum Note (Signed)
Addended by: Daylene Posey T on: 04/13/2013 08:59 AM   Modules accepted: Orders

## 2013-04-26 ENCOUNTER — Other Ambulatory Visit: Payer: Self-pay | Admitting: Family Medicine

## 2013-04-27 NOTE — Telephone Encounter (Signed)
Valium refill called into pharmacy.

## 2013-04-27 NOTE — Telephone Encounter (Signed)
Ok refill?  Take 1/2 tab BID PRN  Last RF 11/20 #30

## 2013-04-27 NOTE — Telephone Encounter (Signed)
Okay to refill? 

## 2013-05-07 DIAGNOSIS — M5430 Sciatica, unspecified side: Secondary | ICD-10-CM | POA: Diagnosis not present

## 2013-05-07 DIAGNOSIS — M999 Biomechanical lesion, unspecified: Secondary | ICD-10-CM | POA: Diagnosis not present

## 2013-05-07 DIAGNOSIS — M545 Low back pain, unspecified: Secondary | ICD-10-CM | POA: Diagnosis not present

## 2013-05-14 DIAGNOSIS — Z79899 Other long term (current) drug therapy: Secondary | ICD-10-CM | POA: Diagnosis not present

## 2013-05-14 DIAGNOSIS — G894 Chronic pain syndrome: Secondary | ICD-10-CM | POA: Diagnosis not present

## 2013-05-14 DIAGNOSIS — M545 Low back pain, unspecified: Secondary | ICD-10-CM | POA: Diagnosis not present

## 2013-05-14 DIAGNOSIS — M542 Cervicalgia: Secondary | ICD-10-CM | POA: Diagnosis not present

## 2013-05-17 ENCOUNTER — Telehealth: Payer: Self-pay | Admitting: *Deleted

## 2013-05-17 MED ORDER — TIOTROPIUM BROMIDE MONOHYDRATE 18 MCG IN CAPS: 18.0000 ug | ORAL_CAPSULE | Freq: Every day | RESPIRATORY_TRACT | Status: DC

## 2013-05-17 NOTE — Telephone Encounter (Signed)
Meds refilled.

## 2013-05-18 DIAGNOSIS — M79609 Pain in unspecified limb: Secondary | ICD-10-CM | POA: Diagnosis not present

## 2013-05-18 DIAGNOSIS — G5760 Lesion of plantar nerve, unspecified lower limb: Secondary | ICD-10-CM | POA: Diagnosis not present

## 2013-05-20 ENCOUNTER — Other Ambulatory Visit: Payer: Self-pay | Admitting: Family Medicine

## 2013-05-20 DIAGNOSIS — Z139 Encounter for screening, unspecified: Secondary | ICD-10-CM

## 2013-05-20 NOTE — Telephone Encounter (Signed)
Medication refilled per protocol. 

## 2013-05-21 DIAGNOSIS — M5430 Sciatica, unspecified side: Secondary | ICD-10-CM | POA: Diagnosis not present

## 2013-05-21 DIAGNOSIS — M545 Low back pain, unspecified: Secondary | ICD-10-CM | POA: Diagnosis not present

## 2013-05-21 DIAGNOSIS — M999 Biomechanical lesion, unspecified: Secondary | ICD-10-CM | POA: Diagnosis not present

## 2013-05-25 ENCOUNTER — Other Ambulatory Visit: Payer: Self-pay | Admitting: Family Medicine

## 2013-05-27 NOTE — Telephone Encounter (Signed)
?   Ok to refill; last ov 04/12/13,last rf 04/26/13

## 2013-05-28 NOTE — Telephone Encounter (Signed)
Meds refilled.

## 2013-05-28 NOTE — Telephone Encounter (Signed)
Okay to refill? 

## 2013-05-31 ENCOUNTER — Ambulatory Visit (HOSPITAL_COMMUNITY): Payer: Medicare Other

## 2013-06-02 ENCOUNTER — Ambulatory Visit: Payer: Medicare Other | Admitting: Gastroenterology

## 2013-06-04 ENCOUNTER — Encounter: Payer: Self-pay | Admitting: *Deleted

## 2013-06-04 DIAGNOSIS — M5430 Sciatica, unspecified side: Secondary | ICD-10-CM | POA: Diagnosis not present

## 2013-06-04 DIAGNOSIS — M999 Biomechanical lesion, unspecified: Secondary | ICD-10-CM | POA: Diagnosis not present

## 2013-06-04 DIAGNOSIS — M545 Low back pain, unspecified: Secondary | ICD-10-CM | POA: Diagnosis not present

## 2013-06-10 DIAGNOSIS — M542 Cervicalgia: Secondary | ICD-10-CM | POA: Diagnosis not present

## 2013-06-10 DIAGNOSIS — Z79899 Other long term (current) drug therapy: Secondary | ICD-10-CM | POA: Diagnosis not present

## 2013-06-10 DIAGNOSIS — M545 Low back pain, unspecified: Secondary | ICD-10-CM | POA: Diagnosis not present

## 2013-06-10 DIAGNOSIS — G894 Chronic pain syndrome: Secondary | ICD-10-CM | POA: Diagnosis not present

## 2013-06-11 DIAGNOSIS — Z79899 Other long term (current) drug therapy: Secondary | ICD-10-CM | POA: Diagnosis not present

## 2013-06-11 DIAGNOSIS — M542 Cervicalgia: Secondary | ICD-10-CM | POA: Diagnosis not present

## 2013-06-11 DIAGNOSIS — M545 Low back pain, unspecified: Secondary | ICD-10-CM | POA: Diagnosis not present

## 2013-06-11 DIAGNOSIS — G894 Chronic pain syndrome: Secondary | ICD-10-CM | POA: Diagnosis not present

## 2013-06-24 ENCOUNTER — Ambulatory Visit: Payer: Medicare Other | Admitting: Gastroenterology

## 2013-06-24 ENCOUNTER — Other Ambulatory Visit: Payer: Self-pay | Admitting: Family Medicine

## 2013-06-24 DIAGNOSIS — F419 Anxiety disorder, unspecified: Secondary | ICD-10-CM

## 2013-06-25 MED ORDER — DIAZEPAM 5 MG PO TABS: 2.5000 mg | ORAL_TABLET | Freq: Two times a day (BID) | ORAL | Status: DC | PRN

## 2013-06-25 NOTE — Addendum Note (Signed)
Addended by: Olena Mater on: 06/25/2013 09:17 AM   Modules accepted: Orders

## 2013-06-25 NOTE — Telephone Encounter (Signed)
Last OV 12/8 14.  Last RF 05/25/13 #30  OK refill?

## 2013-06-25 NOTE — Telephone Encounter (Signed)
RX called in .

## 2013-07-08 DIAGNOSIS — M542 Cervicalgia: Secondary | ICD-10-CM | POA: Diagnosis not present

## 2013-07-08 DIAGNOSIS — M545 Low back pain, unspecified: Secondary | ICD-10-CM | POA: Diagnosis not present

## 2013-07-08 DIAGNOSIS — Z79899 Other long term (current) drug therapy: Secondary | ICD-10-CM | POA: Diagnosis not present

## 2013-07-08 DIAGNOSIS — G894 Chronic pain syndrome: Secondary | ICD-10-CM | POA: Diagnosis not present

## 2013-07-14 ENCOUNTER — Ambulatory Visit (INDEPENDENT_AMBULATORY_CARE_PROVIDER_SITE_OTHER): Payer: Medicare Other | Admitting: Gastroenterology

## 2013-07-14 ENCOUNTER — Encounter (INDEPENDENT_AMBULATORY_CARE_PROVIDER_SITE_OTHER): Payer: Self-pay

## 2013-07-14 ENCOUNTER — Other Ambulatory Visit: Payer: Self-pay | Admitting: Family Medicine

## 2013-07-14 ENCOUNTER — Encounter: Payer: Self-pay | Admitting: Gastroenterology

## 2013-07-14 ENCOUNTER — Ambulatory Visit: Payer: Medicare Other | Admitting: Gastroenterology

## 2013-07-14 VITALS — BP 127/86 | HR 98 | Temp 98.2°F | Ht 60.25 in | Wt 121.8 lb

## 2013-07-14 DIAGNOSIS — K219 Gastro-esophageal reflux disease without esophagitis: Secondary | ICD-10-CM

## 2013-07-14 DIAGNOSIS — K5090 Crohn's disease, unspecified, without complications: Secondary | ICD-10-CM

## 2013-07-14 DIAGNOSIS — R7402 Elevation of levels of lactic acid dehydrogenase (LDH): Secondary | ICD-10-CM

## 2013-07-14 DIAGNOSIS — R1310 Dysphagia, unspecified: Secondary | ICD-10-CM

## 2013-07-14 DIAGNOSIS — Z1231 Encounter for screening mammogram for malignant neoplasm of breast: Secondary | ICD-10-CM

## 2013-07-14 DIAGNOSIS — R7401 Elevation of levels of liver transaminase levels: Secondary | ICD-10-CM | POA: Diagnosis not present

## 2013-07-14 DIAGNOSIS — R740 Nonspecific elevation of levels of transaminase and lactic acid dehydrogenase [LDH]: Principal | ICD-10-CM

## 2013-07-14 NOTE — Progress Notes (Signed)
cc'd to pcp 

## 2013-07-14 NOTE — Assessment & Plan Note (Signed)
SX CONTROLLED. SURGICAL REMISSION ACHIEVED.  CONTINUE TO MONITOR SYMPTOMS. STOP SMOKING OPV IN 6 MOS

## 2013-07-14 NOTE — Assessment & Plan Note (Signed)
SX RESOLVED.  CONTINUE TO MONITOR SYMPTOMS.

## 2013-07-14 NOTE — Assessment & Plan Note (Signed)
SX CONTROLLED.  CONTINUE DEXILANT.  AVOID TRIGGERS.

## 2013-07-14 NOTE — Assessment & Plan Note (Signed)
REMAINS ELEVATED. PT OCCASIONALLY DRINK ETOH.  RECOMMEND LIVER BIOPSY. PT DECLINES AT THIS TIME. REPEAT HFP TWICE A YEAR. OPV IN 6 MOS

## 2013-07-14 NOTE — Progress Notes (Signed)
Subjective:    Patient ID: [name] [name], female    DOB: April 20, 1956, 58 y.o.   MRN: [id number] [name], MD  HPI MOTHER-IN-LAW RECENTLY PASSED FEB 14. BOWELS AMY BE #7 TO #4. OCCASIONALLY CONSUMES DAIRY. CRAMPY ABD PAIN AND BLOATING: PART NEAR EVERY DAY. DEPENDS ON WHAT SHE EATS BELLY FEELS BETTER. TRIGGERS: BROCCOLI, RIBS, CORN. CIGS: < 1/2 PK/DAY. SOME DAYS NO CIGS. HAS COUGH AND CHEST PAIN. MAYBE STRESS OR BRONCHITIS. WEIGHT STABLE SINCE 2009.  RAEE NAUSEA. RARE BRBPR: ONCE EVERY COUPLE OF MOS. USES TUCKS. IF SWALLOWS HEAD FORWARD AND EASIER TO TAKE HER PILLS. FEEDS DER AND THEN SHOOTS THEM AND THEN EATS THEM. OCCASIONAL ETOH-GLASS OF WINE OCCASIONALLY. PT DENIES FEVER, CHILLS, vomiting, melena, problems swallowing, OR heartburn or indigestion.  Past Medical History  Diagnosis Date  . Elevated liver enzymes 2009: BMI 24 ?Etoh ALT 94, AST 51,  NEG ANA, qIGs& ASMA    MAY 2012 AST 52 ALT 38  . Anastomotic ulcer JAN 2009  . Esophageal stricture 2009  . Diarrhea MULITIFACTORIAL    IBS, LACTOSE INTOLERANCE, SBBO, BILE-SALT  . Inflammatory bowel disease 2006 CD    SURGICAL REMISSION  . LBP (low back pain)   . Migraine   . Asthma   . Allergic rhinitis   . CTS (carpal tunnel syndrome)   . Hyperlipemia   . Anxiety   . Hemorrhoid   . Crohn disease   . BMI (body mass index) 20.0-29.9 2009 121 lbs  . 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 Shortness of breath     exertion and humidity  . GERD (gastroesophageal reflux disease)   . Hypertension   . Sleep apnea     Stop Bang Cock score of 4  . PONV (postoperative nausea and vomiting)    Past Surgical History  Procedure Laterality Date  . Hemicolectomy  RIGHT 2006  . Hernia repair    . Carpal tunnel release  LEFT  . Lumbar disc surgery    . Vocal cord surgery  Sep 20, 2010    precancerous areas removed  . Esophagogastroduodenoscopy  02/07/09    mild gastritis  . Colonoscopy  JAN 2009 DIARRHEA, ABD PAIN, BRBPR    ANASTOMOTIC  ULCER(3MM), IH DG:UYQIHK ULCER, NL COLON bX  . Upper gastrointestinal endoscopy  JAN 2009 ABD PAIN    GASTRITIS, ESO RING  . Upper gastrointestinal endoscopy  OCT 2010 ABD PAIN, DYSPHAGIA    DIL 15MM, GASTRITIS, NL DUODENUM  . Upper gastrointestinal endoscopy  OCT 2010 DYSPHAGIA    DIL 17 MM, GASTRITIS, NL DUODENUM  . Dilation and curettage of uterus    . Bilateral salpingoophorectomy    . Wisdom tooth extraction      pin placement due to broken jaw  . Colon surgery    . Back surgery    . Esophageal dilation  12/24/2011    SLF: A stricture was found in the distal esophagus/ Moderate gastritis/ MILD Duodenitis  . Esophageal biopsy  12/24/2011    Procedure: BIOPSY;  Surgeon: Danie Binder, MD;  Location: [facility];  Service: Endoscopy;;  Gastric Biopsies  . Esophagogastroduodenoscopy (egd) with propofol N/A 06/30/2012    Procedure: ESOPHAGOGASTRODUODENOSCOPY (EGD) WITH PROPOFOL;  Surgeon: Danie Binder, MD;  Location: [facility];  Service: Endoscopy;  Laterality: N/A;  . Savory dilation N/A 06/30/2012    Procedure: SAVORY DILATION;  Surgeon: Danie Binder, MD;  Location: [facility];  Service: Endoscopy;  Laterality: N/A;  12.89m , 110m 1552m.  Esophageal biopsy N/A 06/30/2012    Procedure: BIOPSY;  Surgeon: Danie Binder, MD;  Location: [facility];  Service: Endoscopy;  Laterality: N/A;  Gastric and Esophageal Biopsies   Allergies  Allergen Reactions  . Lovastatin Other (See Comments)    Chest pain  . Nexium [Esomeprazole Magnesium] Diarrhea    Abdominal pain   Current Outpatient Prescriptions  Medication Sig Dispense Refill  . albuterol (PROVENTIL HFA;VENTOLIN HFA) 108 (90 BASE) MCG/ACT inhaler Inhale 2 puffs into the lungs every 4 (four) hours as needed for wheezing.    Marland Kitchen amLODipine (NORVASC) 5 MG tablet TAKE ONE TABLET BY MOUTH ONCE DAILY    . calcium carbonate (TUMS - DOSED IN MG ELEMENTAL CALCIUM) 500 MG chewable tablet Chew 1 tablet by mouth as needed. For heartburn    . cholestyramine  (QUESTRAN) 4 GM/DOSE powder TAKE 2 GRAMS BY MOUTH TWO TIMES DAILY WITH A MEAL. Do not take within 2 hours of other medications.    . cyclobenzaprine (FLEXERIL) 5 MG tablet Take 5 mg by mouth 2 (two) times daily.    Marland Kitchen dexlansoprazole (DEXILANT) 60 MG capsule Take 1 capsule (60 mg total) by mouth daily.    . diazepam (VALIUM) 5 MG tablet Take 0.5 tablets (2.5 mg total) by mouth 2 (two) times daily as needed for anxiety.    . gabapentin (NEURONTIN) 300 MG capsule Take 300 mg by mouth 2 (two) times daily.    Marland Kitchen HYDROcodone-acetaminophen (NORCO) 10-325 MG per tablet Take 1 tablet by mouth every 6 (six) hours as needed for pain.    Marland Kitchen ketoconazole (NIZORAL) 2 % shampoo Apply 1 application topically 2 (two) times a week. For 4 weeks1    . nystatin (MYCOSTATIN) 100000 UNIT/ML suspension Take 500,000 Units by mouth 4 (four) times daily. Just as needed    . quinapril-hydrochlorothiazide (ACCURETIC) 10-12.5 MG per tablet Take 1 tablet by mouth 2 (two) times daily.    Marland Kitchen tiotropium (SPIRIVA HANDIHALER) 18 MCG inhalation capsule Place 1 capsule (18 mcg total) into inhaler and inhale daily.    Marland Kitchen HYDROcodone-acetaminophen (NORCO/VICODIN) 5-325 MG per tablet 1 po tid  rx for 9/20,10/20, 11/20, 12/20       Review of Systems     Objective:   Physical Exam        Assessment & Plan:

## 2013-07-14 NOTE — Patient Instructions (Signed)
STOP SMOKING.  FOLLOW A HIGH FIBER/LOW FAT DIET. SEE INFO BELOW.  CONTINUE DEXILANT  AVOID TRIGGERS FOR HEART BURN, ABDOMINAL PAIN, AND DIARRHEA.  FOLLOW UP IN 6 MOS.   High-Fiber Diet A high-fiber diet changes your normal diet to include more whole grains, legumes, fruits, and vegetables. Changes in the diet involve replacing refined carbohydrates with unrefined foods. The calorie level of the diet is essentially unchanged. The Dietary Reference Intake (recommended amount) for adult males is 38 grams per day. For adult females, it is 25 grams per day. Pregnant and lactating women should consume 28 grams of fiber per day. Fiber is the intact part of a plant that is not broken down during digestion. Functional fiber is fiber that has been isolated from the plant to provide a beneficial effect in the body. PURPOSE  Increase stool bulk.   Ease and regulate bowel movements.   Lower cholesterol.  INDICATIONS THAT YOU NEED MORE FIBER  Constipation and hemorrhoids.   Uncomplicated diverticulosis (intestine condition) and irritable bowel syndrome.   Weight management.   As a protective measure against hardening of the arteries (atherosclerosis), diabetes, and cancer.   GUIDELINES FOR INCREASING FIBER IN THE DIET  Start adding fiber to the diet slowly. A gradual increase of about 5 more grams (2 slices of whole-wheat bread, 2 servings of most fruits or vegetables, or 1 bowl of high-fiber cereal) per day is best. Too rapid an increase in fiber may result in constipation, flatulence, and bloating.   Drink enough water and fluids to keep your urine clear or pale yellow. Water, juice, or caffeine-free drinks are recommended. Not drinking enough fluid may cause constipation.   Eat a variety of high-fiber foods rather than one type of fiber.   Try to increase your intake of fiber through using high-fiber foods rather than fiber pills or supplements that contain small amounts of fiber.   The  goal is to change the types of food eaten. Do not supplement your present diet with high-fiber foods, but replace foods in your present diet.  INCLUDE A VARIETY OF FIBER SOURCES  Replace refined and processed grains with whole grains, canned fruits with fresh fruits, and incorporate other fiber sources. White rice, white breads, and most bakery goods contain little or no fiber.   Brown whole-grain rice, buckwheat oats, and many fruits and vegetables are all good sources of fiber. These include: broccoli, Brussels sprouts, cabbage, cauliflower, beets, sweet potatoes, white potatoes (skin on), carrots, tomatoes, eggplant, squash, berries, fresh fruits, and dried fruits.   Cereals appear to be the richest source of fiber. Cereal fiber is found in whole grains and bran. Bran is the fiber-rich outer coat of cereal grain, which is largely removed in refining. In whole-grain cereals, the bran remains. In breakfast cereals, the largest amount of fiber is found in those with "bran" in their names. The fiber content is sometimes indicated on the label.   You may need to include additional fruits and vegetables each day.   In baking, for 1 cup white flour, you may use the following substitutions:   1 cup whole-wheat flour minus 2 tablespoons.   1/2 cup white flour plus 1/2 cup whole-wheat flour.   Low-Fat Diet BREADS, CEREALS, PASTA, RICE, DRIED PEAS, AND BEANS These products are high in carbohydrates and most are low in fat. Therefore, they can be increased in the diet as substitutes for fatty foods. They too, however, contain calories and should not be eaten in excess. Cereals can  be eaten for snacks as well as for breakfast.   FRUITS AND VEGETABLES It is good to eat fruits and vegetables. Besides being sources of fiber, both are rich in vitamins and some minerals. They help you get the daily allowances of these nutrients. Fruits and vegetables can be used for snacks and desserts.  MEATS Limit lean  meat, chicken, [place], and fish to no more than 6 ounces per day. Beef, Pork, and Lamb Use lean cuts of beef, pork, and lamb. Lean cuts include:  Extra-lean ground beef.  Arm roast.  Sirloin tip.  Center-cut ham.  Round steak.  Loin chops.  Rump roast.  Tenderloin.  Trim all fat off the outside of meats before cooking. It is not necessary to severely decrease the intake of red meat, but lean choices should be made. Lean meat is rich in protein and contains a highly absorbable form of iron. Premenopausal women, in particular, should avoid reducing lean red meat because this could increase the risk for low red blood cells (iron-deficiency anemia).  Chicken and [place] These are good sources of protein. The fat of poultry can be reduced by removing the skin and underlying fat layers before cooking. Chicken and [place] can be substituted for lean red meat in the diet. Poultry should not be fried or covered with high-fat sauces. Fish and Shellfish Fish is a good source of protein. Shellfish contain cholesterol, but they usually are low in saturated fatty acids. The preparation of fish is important. Like chicken and [place], they should not be fried or covered with high-fat sauces. EGGS Egg whites contain no fat or cholesterol. They can be eaten often. Try 1 to 2 egg whites instead of whole eggs in recipes or use egg substitutes that do not contain yolk. MILK AND DAIRY PRODUCTS Use skim or 1% milk instead of 2% or whole milk. Decrease whole milk, natural, and processed cheeses. Use nonfat or low-fat (2%) cottage cheese or low-fat cheeses made from vegetable oils. Choose nonfat or low-fat (1 to 2%) yogurt. Experiment with evaporated skim milk in recipes that call for heavy cream. Substitute low-fat yogurt or low-fat cottage cheese for sour cream in dips and salad dressings. Have at least 2 servings of low-fat dairy products, such as 2 glasses of skim (or 1%) milk each day to help get your daily calcium  intake. FATS AND OILS Reduce the total intake of fats, especially saturated fat. Butterfat, lard, and beef fats are high in saturated fat and cholesterol. These should be avoided as much as possible. Vegetable fats do not contain cholesterol, but certain vegetable fats, such as coconut oil, palm oil, and palm kernel oil are very high in saturated fats. These should be limited. These fats are often used in bakery goods, processed foods, popcorn, oils, and nondairy creamers. Vegetable shortenings and some peanut butters contain hydrogenated oils, which are also saturated fats. Read the labels on these foods and check for saturated vegetable oils. Unsaturated vegetable oils and fats do not raise blood cholesterol. However, they should be limited because they are fats and are high in calories. Total fat should still be limited to 30% of your daily caloric intake. Desirable liquid vegetable oils are corn oil, cottonseed oil, olive oil, canola oil, safflower oil, soybean oil, and sunflower oil. Peanut oil is not as good, but small amounts are acceptable. Buy a heart-healthy tub margarine that has no partially hydrogenated oils in the ingredients. Mayonnaise and salad dressings often are made from unsaturated fats, but they  should also be limited because of their high calorie and fat content. Seeds, nuts, peanut butter, olives, and avocados are high in fat, but the fat is mainly the unsaturated type. These foods should be limited mainly to avoid excess calories and fat. OTHER EATING TIPS Snacks  Most sweets should be limited as snacks. They tend to be rich in calories and fats, and their caloric content outweighs their nutritional value. Some good choices in snacks are graham crackers, melba toast, soda crackers, bagels (no egg), English muffins, fruits, and vegetables. These snacks are preferable to snack crackers, [place] fries, TORTILLA CHIPS, and POTATO chips. Popcorn should be air-popped or cooked in small  amounts of liquid vegetable oil. Desserts Eat fruit, low-fat yogurt, and fruit ices instead of pastries, cake, and cookies. Sherbet, angel food cake, gelatin dessert, frozen low-fat yogurt, or other frozen products that do not contain saturated fat (pure fruit juice bars, frozen ice pops) are also acceptable.  COOKING METHODS Choose those methods that use little or no fat. They include: Poaching.  Braising.  Steaming.  Grilling.  Baking.  Stir-frying.  Broiling.  Microwaving.  Foods can be cooked in a nonstick pan without added fat, or use a nonfat cooking spray in regular cookware. Limit fried foods and avoid frying in saturated fat. Add moisture to lean meats by using water, broth, cooking wines, and other nonfat or low-fat sauces along with the cooking methods mentioned above. Soups and stews should be chilled after cooking. The fat that forms on top after a few hours in the refrigerator should be skimmed off. When preparing meals, avoid using excess salt. Salt can contribute to raising blood pressure in some people.  EATING AWAY FROM HOME Order entres, potatoes, and vegetables without sauces or butter. When meat exceeds the size of a deck of cards (3 to 4 ounces), the rest can be taken home for another meal. Choose vegetable or fruit salads and ask for low-calorie salad dressings to be served on the side. Use dressings sparingly. Limit high-fat toppings, such as bacon, crumbled eggs, cheese, sunflower seeds, and olives. Ask for heart-healthy tub margarine instead of butter.

## 2013-07-15 ENCOUNTER — Ambulatory Visit (HOSPITAL_COMMUNITY)
Admission: RE | Admit: 2013-07-15 | Discharge: 2013-07-15 | Disposition: A | Discharge status: Home / Self Care | Payer: Medicare Other | Source: Ambulatory Visit | Attending: Family Medicine | Admitting: Family Medicine

## 2013-07-15 DIAGNOSIS — Z1231 Encounter for screening mammogram for malignant neoplasm of breast: Secondary | ICD-10-CM | POA: Insufficient documentation

## 2013-07-16 DIAGNOSIS — M545 Low back pain, unspecified: Secondary | ICD-10-CM | POA: Diagnosis not present

## 2013-07-16 DIAGNOSIS — G5760 Lesion of plantar nerve, unspecified lower limb: Secondary | ICD-10-CM | POA: Diagnosis not present

## 2013-07-16 DIAGNOSIS — L259 Unspecified contact dermatitis, unspecified cause: Secondary | ICD-10-CM | POA: Diagnosis not present

## 2013-07-16 DIAGNOSIS — M999 Biomechanical lesion, unspecified: Secondary | ICD-10-CM | POA: Diagnosis not present

## 2013-07-16 DIAGNOSIS — M5430 Sciatica, unspecified side: Secondary | ICD-10-CM | POA: Diagnosis not present

## 2013-07-19 NOTE — Progress Notes (Signed)
Reminder in epic °

## 2013-07-26 ENCOUNTER — Emergency Department (HOSPITAL_COMMUNITY): Payer: Medicare Other

## 2013-07-26 ENCOUNTER — Encounter (HOSPITAL_COMMUNITY): Payer: Self-pay | Admitting: Emergency Medicine

## 2013-07-26 ENCOUNTER — Emergency Department (HOSPITAL_COMMUNITY)
Admission: EM | Admit: 2013-07-26 | Discharge: 2013-07-26 | Disposition: A | Discharge status: Home / Self Care | Payer: Medicare Other | Attending: Emergency Medicine | Admitting: Emergency Medicine

## 2013-07-26 DIAGNOSIS — I10 Essential (primary) hypertension: Secondary | ICD-10-CM | POA: Diagnosis not present

## 2013-07-26 DIAGNOSIS — G43909 Migraine, unspecified, not intractable, without status migrainosus: Secondary | ICD-10-CM | POA: Insufficient documentation

## 2013-07-26 DIAGNOSIS — F411 Generalized anxiety disorder: Secondary | ICD-10-CM | POA: Insufficient documentation

## 2013-07-26 DIAGNOSIS — E785 Hyperlipidemia, unspecified: Secondary | ICD-10-CM | POA: Insufficient documentation

## 2013-07-26 DIAGNOSIS — Z87891 Personal history of nicotine dependence: Secondary | ICD-10-CM | POA: Insufficient documentation

## 2013-07-26 DIAGNOSIS — R059 Cough, unspecified: Secondary | ICD-10-CM | POA: Diagnosis not present

## 2013-07-26 DIAGNOSIS — R05 Cough: Secondary | ICD-10-CM | POA: Diagnosis not present

## 2013-07-26 DIAGNOSIS — Z79899 Other long term (current) drug therapy: Secondary | ICD-10-CM | POA: Insufficient documentation

## 2013-07-26 DIAGNOSIS — J4489 Other specified chronic obstructive pulmonary disease: Secondary | ICD-10-CM | POA: Insufficient documentation

## 2013-07-26 DIAGNOSIS — K219 Gastro-esophageal reflux disease without esophagitis: Secondary | ICD-10-CM | POA: Diagnosis not present

## 2013-07-26 DIAGNOSIS — J189 Pneumonia, unspecified organism: Secondary | ICD-10-CM

## 2013-07-26 DIAGNOSIS — Z8669 Personal history of other diseases of the nervous system and sense organs: Secondary | ICD-10-CM | POA: Insufficient documentation

## 2013-07-26 DIAGNOSIS — J159 Unspecified bacterial pneumonia: Secondary | ICD-10-CM | POA: Insufficient documentation

## 2013-07-26 DIAGNOSIS — J449 Chronic obstructive pulmonary disease, unspecified: Secondary | ICD-10-CM | POA: Diagnosis not present

## 2013-07-26 DIAGNOSIS — R079 Chest pain, unspecified: Secondary | ICD-10-CM | POA: Diagnosis not present

## 2013-07-26 LAB — CBC WITH DIFFERENTIAL/PLATELET
Basophils Absolute: 0 10*3/uL (ref 0.0–0.1)
Basophils Relative: 1 % (ref 0–1)
Eosinophils Absolute: 0 10*3/uL (ref 0.0–0.7)
Eosinophils Relative: 1 % (ref 0–5)
HCT: 45.6 % (ref 36.0–46.0)
Hemoglobin: 15.9 g/dL — ABNORMAL HIGH (ref 12.0–15.0)
Lymphocytes Relative: 13 % (ref 12–46)
Lymphs Abs: 0.6 10*3/uL — ABNORMAL LOW (ref 0.7–4.0)
MCH: 35 pg — ABNORMAL HIGH (ref 26.0–34.0)
MCHC: 34.9 g/dL (ref 30.0–36.0)
MCV: 100.4 fL — ABNORMAL HIGH (ref 78.0–100.0)
Monocytes Absolute: 0.4 10*3/uL (ref 0.1–1.0)
Monocytes Relative: 7 % (ref 3–12)
Neutro Abs: 4 10*3/uL (ref 1.7–7.7)
Neutrophils Relative %: 79 % — ABNORMAL HIGH (ref 43–77)
Platelets: 218 10*3/uL (ref 150–400)
RBC: 4.54 MIL/uL (ref 3.87–5.11)
RDW: 13.4 % (ref 11.5–15.5)
WBC: 5 10*3/uL (ref 4.0–10.5)

## 2013-07-26 LAB — BASIC METABOLIC PANEL
BUN: 5 mg/dL — ABNORMAL LOW (ref 6–23)
CO2: 26 mEq/L (ref 19–32)
Calcium: 9.3 mg/dL (ref 8.4–10.5)
Chloride: 98 mEq/L (ref 96–112)
Creatinine, Ser: 0.5 mg/dL (ref 0.50–1.10)
GFR calc Af Amer: >90 mL/min (ref >90)
GFR calc non Af Amer: >90 mL/min (ref >90)
Glucose, Bld: 71 mg/dL (ref 70–99)
Potassium: 3.9 mEq/L (ref 3.7–5.3)
Sodium: 141 mEq/L (ref 137–147)

## 2013-07-26 LAB — TROPONIN I: Troponin I: <0.3 ng/mL (ref <0.30)

## 2013-07-26 MED ORDER — GUAIFENESIN-CODEINE 100-10 MG/5ML PO SOLN
10.0000 mL | Freq: Once | ORAL | Status: AC
  Administered 2013-07-26: 10 mL via ORAL
  Filled 2013-07-26: qty 10

## 2013-07-26 MED ORDER — AZITHROMYCIN 250 MG PO TABS
500.0000 mg | ORAL_TABLET | Freq: Once | ORAL | Status: AC
  Administered 2013-07-26: 500 mg via ORAL
  Filled 2013-07-26: qty 2

## 2013-07-26 MED ORDER — GUAIFENESIN-CODEINE 100-10 MG/5ML PO SYRP: 5.0000 mL | ORAL_SOLUTION | Freq: Three times a day (TID) | ORAL | Status: DC | PRN

## 2013-07-26 MED ORDER — OXYCODONE-ACETAMINOPHEN 5-325 MG PO TABS
2.0000 | ORAL_TABLET | Freq: Once | ORAL | Status: AC
  Administered 2013-07-26: 2 via ORAL
  Filled 2013-07-26: qty 2

## 2013-07-26 MED ORDER — AZITHROMYCIN 250 MG PO TABS: 250.0000 mg | ORAL_TABLET | Freq: Every day | ORAL | Status: DC

## 2013-07-26 NOTE — Discharge Instructions (Signed)

## 2013-07-26 NOTE — ED Notes (Signed)
Pt reports she has pain medication and blood pressure med that is due at 1400. Pt asked to refrain from taking meds until seen by EDP.

## 2013-07-26 NOTE — ED Provider Notes (Signed)
CSN: [id number]     Arrival date & time 07/26/13  1243 History   First MD Initiated Contact with Patient 07/26/13 1355     Chief Complaint  Patient presents with  . Chest Pain     (Consider location/radiation/quality/duration/timing/severity/associated sxs/prior Treatment) HPI  [name] [name] with cough and sob. Intermittent for past week. Occasional sharp L sided CP underneath breast when coughs or inhales deeply. No other appreciable exacerbating or relieving factors. No fever or chills. No n/v. No unusual leg pain or swelling. Feels fatigued. No interventions prior to arrival.   Past Medical History  Diagnosis Date  . Elevated liver enzymes 2009: BMI 24 ?Etoh ALT 94, AST 51,  NEG ANA, qIGs& ASMA    MAY 2012 AST 52 ALT 38  . Anastomotic ulcer JAN 2009  . Esophageal stricture 2009  . Diarrhea MULITIFACTORIAL    IBS, LACTOSE INTOLERANCE, SBBO, BILE-SALT  . Inflammatory bowel disease 2006 CD    SURGICAL REMISSION  . LBP (low back pain)   . Migraine   . Asthma   . Allergic rhinitis   . CTS (carpal tunnel syndrome)   . Hyperlipemia   . Anxiety   . Hemorrhoid   . Crohn disease   . BMI (body mass index) 20.0-29.9 2009 121 lbs  . 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 Shortness of breath     exertion and humidity  . GERD (gastroesophageal reflux disease)   . Hypertension   . Sleep apnea     Stop Bang Cock score of 4  . PONV (postoperative nausea and vomiting)   . Crohn disease    Past Surgical History  Procedure Laterality Date  . Hemicolectomy  RIGHT 2006  . Hernia repair    . Carpal tunnel release  LEFT  . Lumbar disc surgery    . Vocal cord surgery  Sep 20, 2010    precancerous areas removed  . Esophagogastroduodenoscopy  02/07/09    mild gastritis  . Colonoscopy  JAN 2009 DIARRHEA, ABD PAIN, BRBPR    ANASTOMOTIC ULCER(3MM), IH TK:WIOXBD ULCER, NL COLON bX  . Upper gastrointestinal endoscopy  JAN 2009 ABD PAIN    GASTRITIS, ESO RING  . Upper gastrointestinal endoscopy  OCT 2010  ABD PAIN, DYSPHAGIA    DIL 15MM, GASTRITIS, NL DUODENUM  . Upper gastrointestinal endoscopy  OCT 2010 DYSPHAGIA    DIL 17 MM, GASTRITIS, NL DUODENUM  . Dilation and curettage of uterus    . Bilateral salpingoophorectomy    . Wisdom tooth extraction      pin placement due to broken jaw  . Colon surgery    . Back surgery    . Esophageal dilation  12/24/2011    SLF: A stricture was found in the distal esophagus/ Moderate gastritis/ MILD Duodenitis  . Esophageal biopsy  12/24/2011    Procedure: BIOPSY;  Surgeon: Danie Binder, MD;  Location: [facility];  Service: Endoscopy;;  Gastric Biopsies  . Esophagogastroduodenoscopy (egd) with propofol N/A 06/30/2012    Procedure: ESOPHAGOGASTRODUODENOSCOPY (EGD) WITH PROPOFOL;  Surgeon: Danie Binder, MD;  Location: [facility];  Service: Endoscopy;  Laterality: N/A;  . Savory dilation N/A 06/30/2012    Procedure: SAVORY DILATION;  Surgeon: Danie Binder, MD;  Location: [facility];  Service: Endoscopy;  Laterality: N/A;  12.67m , 18m 1520m. Esophageal biopsy N/A 06/30/2012    Procedure: BIOPSY;  Surgeon: SanDanie BinderD;  Location: [facility];  Service: Endoscopy;  Laterality: N/A;  Gastric and Esophageal  Biopsies   Family History  Problem Relation Age of Onset  . Colon cancer Neg Hx   . Colon polyps Neg Hx   . Hypothyroidism Mother   . Heart disease Father   . Hypothyroidism Daughter    History  Substance Use Topics  . Smoking status: Former Smoker -- 0.00 packs/day for 30 years    Types: Cigarettes    Quit date: 03/29/2011  . Smokeless tobacco: Not on file     Comment: smokes off and on  . Alcohol Use: No   OB History   Grav Para Term Preterm Abortions TAB SAB Ect Mult Living                 Review of Systems  All systems reviewed and negative, other than as noted in HPI.   Allergies  Lovastatin and Nexium  Home Medications   Current Outpatient Rx  Name  Route  Sig  Dispense  Refill  . albuterol (PROVENTIL HFA;VENTOLIN HFA) 108 (90  BASE) MCG/ACT inhaler   Inhalation   Inhale 2 puffs into the lungs every 4 (four) hours as needed for wheezing.   1 Inhaler   11     Dispense PRO-AIR,, ventolin not covered   . amLODipine (NORVASC) 5 MG tablet      TAKE ONE TABLET BY MOUTH ONCE DAILY   90 tablet   1   . calcium carbonate (TUMS - DOSED IN MG ELEMENTAL CALCIUM) 500 MG chewable tablet   Oral   Chew 1 tablet by mouth as needed. For heartburn         . cholestyramine (QUESTRAN) 4 GM/DOSE powder      TAKE 2 GRAMS BY MOUTH TWO TIMES DAILY WITH A MEAL. Do not take within 2 hours of other medications.   378 g   3     Dispense as written.   . cyclobenzaprine (FLEXERIL) 5 MG tablet   Oral   Take 5 mg by mouth 2 (two) times daily.         Marland Kitchen dexlansoprazole (DEXILANT) 60 MG capsule   Oral   Take 1 capsule (60 mg total) by mouth daily.   30 capsule   11   . diazepam (VALIUM) 5 MG tablet   Oral   Take 2.5 mg by mouth 2 (two) times daily as needed (stomach spasms).         . gabapentin (NEURONTIN) 300 MG capsule   Oral   Take 300 mg by mouth 2 (two) times daily.         Marland Kitchen HYDROcodone-acetaminophen (NORCO) 10-325 MG per tablet   Oral   Take 1 tablet by mouth every 6 (six) hours as needed for moderate pain.         Marland Kitchen ketoconazole (NIZORAL) 2 % shampoo   Topical   Apply 1 application topically 2 (two) times a week. For 4 weeks1   120 mL   1   . nystatin (MYCOSTATIN) 100000 UNIT/ML suspension   Oral   Take 500,000 Units by mouth 4 (four) times daily. Just as needed         . quinapril-hydrochlorothiazide (ACCURETIC) 10-12.5 MG per tablet   Oral   Take 1 tablet by mouth 2 (two) times daily.   180 tablet   1   . tiotropium (SPIRIVA HANDIHALER) 18 MCG inhalation capsule   Inhalation   Place 1 capsule (18 mcg total) into inhaler and inhale daily.   30 capsule   3  BP 117/83  Pulse 98  Temp(Src) 97.9 F (36.6 C) (Oral)  Resp 18  SpO2 95% Physical Exam  Nursing note and vitals  reviewed. Constitutional: She appears well-developed and well-nourished. No distress.  HENT:  Head: Normocephalic and atraumatic.  Eyes: Conjunctivae are normal. Right eye exhibits no discharge. Left eye exhibits no discharge.  Neck: Neck supple.  Cardiovascular: Normal rate, regular rhythm and normal heart sounds.  Exam reveals no gallop and no friction rub.   No murmur heard. Pulmonary/Chest: Effort normal and breath sounds normal. No respiratory distress. She has no wheezes.  Abdominal: Soft. She exhibits no distension. There is no tenderness.  Musculoskeletal: She exhibits no edema and no tenderness.  Lower extremities symmetric as compared to each other. No calf tenderness. Negative Homan's. No palpable cords.   Neurological: She is alert.  Skin: Skin is warm and dry.  Psychiatric: She has a normal mood and affect. Her behavior is normal. Thought content normal.    ED Course  Procedures (including critical care time) Labs Review Labs Reviewed  CBC WITH DIFFERENTIAL - Abnormal; Notable for the following:    Hemoglobin 15.9 (*)    MCV 100.4 (*)    MCH 35.0 (*)    Neutrophils Relative % 79 (*)    Lymphs Abs 0.6 (*)    All other components within normal limits  BASIC METABOLIC PANEL - Abnormal; Notable for the following:    BUN 5 (*)    All other components within normal limits  TROPONIN I   Imaging Review Dg Chest 2 View  07/26/2013   CLINICAL DATA:  Cough, congestion  EXAM: CHEST  2 VIEW  COMPARISON:  02/15/2010  FINDINGS: Cardiomediastinal silhouette is stable. There is triangular-shaped atelectasis or infiltrate in lingula. Follow-up to resolution is recommended. Osteopenia and mild degenerative changes thoracic spine. No pulmonary edema.  IMPRESSION: Triangular-shaped atelectasis or infiltrate in the lingula. Follow-up to resolution is recommended. No pulmonary edema.   Electronically Signed   By: Lahoma Crocker M.D.   On: 07/26/2013 13:50     EKG  Interpretation   Date/Time:  Monday July 26 2013 12:55:01 EDT Ventricular Rate:  90 PR Interval:  176 QRS Duration: 96 QT Interval:  392 QTC Calculation: 479 R Axis:   22 Text Interpretation:  Normal sinus rhythm LOW VOLTAGE Incomplete right  bundle branch block Abnormal ECG When compared with ECG of 20-Dec-2011  11:45, Minimal criteria for Anterior infarct are now Present  but  also  noted onolder  EKG from 08/22/2008 Confirmed by Trosky  MD, [place] ([id number])  on 07/26/2013 2:10:09 PM      MDM   Final diagnoses:  CAP (community acquired pneumonia)    [name] [name] with intermittent cough, cp and sob for past week. CXR with atelectasis versus infiltrate. With symptoms, will tx for CAP.     Virgel Manifold, MD 07/30/13 [phone]

## 2013-07-26 NOTE — ED Notes (Signed)
Pt c/o intermittent cp x 1 week with cough/sob/weakness.

## 2013-07-30 ENCOUNTER — Encounter: Payer: Self-pay | Admitting: Family Medicine

## 2013-07-30 ENCOUNTER — Ambulatory Visit (INDEPENDENT_AMBULATORY_CARE_PROVIDER_SITE_OTHER): Payer: Medicare Other | Admitting: Family Medicine

## 2013-07-30 VITALS — BP 128/76 | HR 66 | Temp 98.1°F | Resp 20 | Ht 60.0 in | Wt 105.0 lb

## 2013-07-30 DIAGNOSIS — H6190 Disorder of external ear, unspecified, unspecified ear: Secondary | ICD-10-CM | POA: Diagnosis not present

## 2013-07-30 DIAGNOSIS — J189 Pneumonia, unspecified organism: Secondary | ICD-10-CM

## 2013-07-30 DIAGNOSIS — J449 Chronic obstructive pulmonary disease, unspecified: Secondary | ICD-10-CM | POA: Diagnosis not present

## 2013-07-30 MED ORDER — PREDNISONE 20 MG PO TABS: 40.0000 mg | ORAL_TABLET | Freq: Every day | ORAL | Status: DC

## 2013-07-30 NOTE — Assessment & Plan Note (Signed)
No evidence of acute exacerbation however she is at high risk for decline with recurrent pneumonia. I've given her some prednisone to have as we are going into the weekend if she starts to decompensate with chest tightness or wheezing she is to start this she also has her  inhalers

## 2013-07-30 NOTE — Assessment & Plan Note (Signed)
Continue zpak, continue cough medication I advised her to contact her pain clinic to let them know that she was given a prescription for Robitussin with codeine She was a repeat x-ray in 4 weeks to ensure resolution

## 2013-07-30 NOTE — Assessment & Plan Note (Signed)
She has some scabs in one year and the other side this looks like irritation, or have her use triple antibiotic ointment

## 2013-07-30 NOTE — Patient Instructions (Signed)
Use the topical antibiotic for your here Okay to use cough medication Complete antibiotics Start prednisone if you are not better Continue inhalers Change F/U TO 3 months

## 2013-07-30 NOTE — Progress Notes (Signed)
Patient ID: [name] [name], female   DOB: [birth year], 58 y.o.   MRN: [id number]   Subjective:    Patient ID: [name] [name], female    DOB: [birth year], 58 y.o.   MRN: [id number]  Patient presents for Illness Pt here  to followup emergency room visit. She was seen in emergency room one March 23 secondary to increased cough shortness of breath and chest discomfort. Her EKG and troponins were unremarkable chest x-ray showed a lingula pneumonia in setting of her known COPD. She started on azithromycin she also has Robitussin with codeine which has been helping her cough. She still has soreness around her chest and her back but she is improving. She's not had any recent fever. She also complains of some sores in both years states they started his dryness and now she has some scab she has been using Q-tips as well as a washcloth to clean daily but it does cause some bleeding. She also states that her right eye has been twitching and she had to pry it open however she's not had any drainage or redness from the eye no change in her vision this occurred last night     Review Of Systems:  GEN- + fatigue, fever, weight loss,weakness, recent illness HEENT- denies eye drainage, change in vision,+ nasal discharge, CVS- denies chest pain, palpitations RESP- denies SOB, +cough, +wheeze ABD- denies N/V, change in stools, abd pain, +bloating  GU- denies dysuria, hematuria, dribbling, incontinence MSK- denies joint pain, muscle aches, injury Neuro- denies headache, dizziness, syncope, seizure activity       Objective:    BP 128/76  Pulse 66  Temp(Src) 98.1 F (36.7 C)  Resp 20  Ht 5' (1.524 m)  Wt 105 lb (47.628 kg)  BMI 20.51 kg/m2  SpO2 98% GEN- NAD, alert and oriented x3 HEENT- PERRL, EOMI, non injected sclera, pink conjunctiva, MMM, oropharynx clear, TM clear bilat, upper external ear scab right side, dryness with mild erythema left pinna, no maxillary sinus tenderness Neck- Supple, shotty  LAD CVS- RRR, no murmur RESP-course BS bilat, few expiratory wheeze, normal WOB, normal oxygen sat  ABD-NABS,soft,NT,ND EXT- No edema, prominent veins Pulses- Radial, DP- 2+        Assessment & Plan:      Problem List Items Addressed This Visit   Lesion of external ear     She has some scabs in one year and the other side this looks like irritation, or have her use triple antibiotic ointment    COPD     No evidence of acute exacerbation however she is at high risk for decline with recurrent pneumonia. I've given her some prednisone to have as we are going into the weekend if she starts to decompensate with chest tightness or wheezing she is to start this she also has her  inhalers    Relevant Medications      predniSONE (DELTASONE) tablet   CAP (community acquired pneumonia) - Primary     Continue zpak, continue cough medication I advised her to contact her pain clinic to let them know that she was given a prescription for Robitussin with codeine She was a repeat x-ray in 4 weeks to ensure resolution       Note: This dictation was prepared with Dragon dictation along with smaller phrase technology. Any transcriptional errors that result from this process are unintentional.

## 2013-08-04 DIAGNOSIS — M545 Low back pain, unspecified: Secondary | ICD-10-CM | POA: Diagnosis not present

## 2013-08-04 DIAGNOSIS — M5430 Sciatica, unspecified side: Secondary | ICD-10-CM | POA: Diagnosis not present

## 2013-08-04 DIAGNOSIS — M999 Biomechanical lesion, unspecified: Secondary | ICD-10-CM | POA: Diagnosis not present

## 2013-08-11 ENCOUNTER — Ambulatory Visit: Payer: Medicare Other | Admitting: Family Medicine

## 2013-08-12 DIAGNOSIS — G894 Chronic pain syndrome: Secondary | ICD-10-CM | POA: Diagnosis not present

## 2013-08-12 DIAGNOSIS — Z79899 Other long term (current) drug therapy: Secondary | ICD-10-CM | POA: Diagnosis not present

## 2013-08-12 DIAGNOSIS — M545 Low back pain, unspecified: Secondary | ICD-10-CM | POA: Diagnosis not present

## 2013-08-13 DIAGNOSIS — M545 Low back pain, unspecified: Secondary | ICD-10-CM | POA: Diagnosis not present

## 2013-08-13 DIAGNOSIS — M542 Cervicalgia: Secondary | ICD-10-CM | POA: Diagnosis not present

## 2013-08-13 DIAGNOSIS — Z79899 Other long term (current) drug therapy: Secondary | ICD-10-CM | POA: Diagnosis not present

## 2013-08-13 DIAGNOSIS — G894 Chronic pain syndrome: Secondary | ICD-10-CM | POA: Diagnosis not present

## 2013-08-24 ENCOUNTER — Other Ambulatory Visit: Payer: Self-pay | Admitting: Family Medicine

## 2013-08-24 NOTE — Telephone Encounter (Signed)
Okay to refill give 2

## 2013-08-24 NOTE — Telephone Encounter (Signed)
Medication called to pharmacy. 

## 2013-08-24 NOTE — Telephone Encounter (Signed)
Ok to refill??  Last office visit 07/30/2013.  Last refill 06/25/2013.

## 2013-08-26 ENCOUNTER — Telehealth: Payer: Self-pay | Admitting: *Deleted

## 2013-08-26 NOTE — Telephone Encounter (Signed)
Call placed to patient and patient made aware.  

## 2013-08-26 NOTE — Telephone Encounter (Signed)
Message copied by Sheral Flow on Thu Aug 26, 2013 11:47 AM ------      Message from: [name] F      Created: Wed Aug 25, 2013  9:23 PM      Regarding: FW: rEPEAT cxr NEEDED       Please call, pt she is due to get her CXR , order has already been sent      ----- Message -----         From: Alycia Rossetti, MD         Sent: 08/25/2013           To: Alycia Rossetti, MD      Subject: rEPEAT cxr NEEDED                                               ------

## 2013-09-09 DIAGNOSIS — M549 Dorsalgia, unspecified: Secondary | ICD-10-CM | POA: Diagnosis not present

## 2013-09-09 DIAGNOSIS — G894 Chronic pain syndrome: Secondary | ICD-10-CM | POA: Diagnosis not present

## 2013-09-09 DIAGNOSIS — M545 Low back pain, unspecified: Secondary | ICD-10-CM | POA: Diagnosis not present

## 2013-09-09 DIAGNOSIS — Z79899 Other long term (current) drug therapy: Secondary | ICD-10-CM | POA: Diagnosis not present

## 2013-09-20 ENCOUNTER — Other Ambulatory Visit: Payer: Self-pay | Admitting: Family Medicine

## 2013-09-20 NOTE — Telephone Encounter (Signed)
Okay to refill give 2, remind her CXR is overdue still

## 2013-09-20 NOTE — Telephone Encounter (Signed)
Ok to refill??  Last office visit 07/30/2013.  Last refill 08/24/2013.

## 2013-09-21 NOTE — Telephone Encounter (Signed)
Medication called to pharmacy.  [place].

## 2013-09-22 ENCOUNTER — Ambulatory Visit (HOSPITAL_COMMUNITY)
Admission: RE | Admit: 2013-09-22 | Discharge: 2013-09-22 | Disposition: A | Discharge status: Home / Self Care | Payer: Medicare Other | Source: Ambulatory Visit | Attending: Family Medicine | Admitting: Family Medicine

## 2013-09-22 DIAGNOSIS — J189 Pneumonia, unspecified organism: Secondary | ICD-10-CM

## 2013-09-22 DIAGNOSIS — R0602 Shortness of breath: Secondary | ICD-10-CM | POA: Diagnosis not present

## 2013-10-04 DIAGNOSIS — M545 Low back pain, unspecified: Secondary | ICD-10-CM | POA: Diagnosis not present

## 2013-10-04 DIAGNOSIS — M5430 Sciatica, unspecified side: Secondary | ICD-10-CM | POA: Diagnosis not present

## 2013-10-04 DIAGNOSIS — M999 Biomechanical lesion, unspecified: Secondary | ICD-10-CM | POA: Diagnosis not present

## 2013-10-10 ENCOUNTER — Emergency Department (HOSPITAL_COMMUNITY)
Admission: EM | Admit: 2013-10-10 | Discharge: 2013-10-10 | Disposition: A | Discharge status: Home / Self Care | Payer: Medicare Other | Attending: Emergency Medicine | Admitting: Emergency Medicine

## 2013-10-10 ENCOUNTER — Emergency Department (HOSPITAL_COMMUNITY): Payer: Medicare Other

## 2013-10-10 ENCOUNTER — Encounter (HOSPITAL_COMMUNITY): Payer: Self-pay | Admitting: Emergency Medicine

## 2013-10-10 DIAGNOSIS — J4489 Other specified chronic obstructive pulmonary disease: Secondary | ICD-10-CM | POA: Insufficient documentation

## 2013-10-10 DIAGNOSIS — Z8669 Personal history of other diseases of the nervous system and sense organs: Secondary | ICD-10-CM | POA: Diagnosis not present

## 2013-10-10 DIAGNOSIS — Z79899 Other long term (current) drug therapy: Secondary | ICD-10-CM | POA: Diagnosis not present

## 2013-10-10 DIAGNOSIS — IMO0002 Reserved for concepts with insufficient information to code with codable children: Secondary | ICD-10-CM | POA: Insufficient documentation

## 2013-10-10 DIAGNOSIS — IMO0001 Reserved for inherently not codable concepts without codable children: Secondary | ICD-10-CM | POA: Diagnosis not present

## 2013-10-10 DIAGNOSIS — K219 Gastro-esophageal reflux disease without esophagitis: Secondary | ICD-10-CM | POA: Insufficient documentation

## 2013-10-10 DIAGNOSIS — M791 Myalgia, unspecified site: Secondary | ICD-10-CM

## 2013-10-10 DIAGNOSIS — G8929 Other chronic pain: Secondary | ICD-10-CM | POA: Insufficient documentation

## 2013-10-10 DIAGNOSIS — R0602 Shortness of breath: Secondary | ICD-10-CM | POA: Diagnosis not present

## 2013-10-10 DIAGNOSIS — E785 Hyperlipidemia, unspecified: Secondary | ICD-10-CM | POA: Diagnosis not present

## 2013-10-10 DIAGNOSIS — M6281 Muscle weakness (generalized): Secondary | ICD-10-CM | POA: Insufficient documentation

## 2013-10-10 DIAGNOSIS — F411 Generalized anxiety disorder: Secondary | ICD-10-CM | POA: Insufficient documentation

## 2013-10-10 DIAGNOSIS — Z87891 Personal history of nicotine dependence: Secondary | ICD-10-CM | POA: Insufficient documentation

## 2013-10-10 DIAGNOSIS — R059 Cough, unspecified: Secondary | ICD-10-CM

## 2013-10-10 DIAGNOSIS — G43909 Migraine, unspecified, not intractable, without status migrainosus: Secondary | ICD-10-CM | POA: Insufficient documentation

## 2013-10-10 DIAGNOSIS — J449 Chronic obstructive pulmonary disease, unspecified: Secondary | ICD-10-CM | POA: Diagnosis not present

## 2013-10-10 DIAGNOSIS — R0600 Dyspnea, unspecified: Secondary | ICD-10-CM

## 2013-10-10 DIAGNOSIS — R05 Cough: Secondary | ICD-10-CM | POA: Diagnosis not present

## 2013-10-10 DIAGNOSIS — I10 Essential (primary) hypertension: Secondary | ICD-10-CM | POA: Insufficient documentation

## 2013-10-10 MED ORDER — SODIUM CHLORIDE 0.9 % IV SOLN
1000.0000 mL | Freq: Once | INTRAVENOUS | Status: AC
  Administered 2013-10-10: 1000 mL via INTRAVENOUS

## 2013-10-10 MED ORDER — METHYLPREDNISOLONE SODIUM SUCC 125 MG IJ SOLR
125.0000 mg | Freq: Once | INTRAMUSCULAR | Status: AC
  Administered 2013-10-10: 125 mg via INTRAVENOUS
  Filled 2013-10-10: qty 2

## 2013-10-10 MED ORDER — BENZONATATE 100 MG PO CAPS: 100.0000 mg | ORAL_CAPSULE | Freq: Three times a day (TID) | ORAL | Status: DC

## 2013-10-10 MED ORDER — SODIUM CHLORIDE 0.9 % IV SOLN: 1000.0000 mL | INTRAVENOUS | Status: DC

## 2013-10-10 MED ORDER — IPRATROPIUM-ALBUTEROL 0.5-2.5 (3) MG/3ML IN SOLN
3.0000 mL | Freq: Once | RESPIRATORY_TRACT | Status: AC
  Administered 2013-10-10: 3 mL via RESPIRATORY_TRACT
  Filled 2013-10-10: qty 3

## 2013-10-10 MED ORDER — PREDNISONE 50 MG PO TABS: 50.0000 mg | ORAL_TABLET | Freq: Every day | ORAL | Status: DC

## 2013-10-10 MED ORDER — HYDROMORPHONE HCL PF 2 MG/ML IJ SOLN
2.0000 mg | Freq: Once | INTRAMUSCULAR | Status: AC
  Administered 2013-10-10: 2 mg via INTRAVENOUS
  Filled 2013-10-10: qty 1

## 2013-10-10 MED ORDER — ONDANSETRON HCL 4 MG/2ML IJ SOLN
4.0000 mg | Freq: Once | INTRAMUSCULAR | Status: AC
  Administered 2013-10-10: 4 mg via INTRAVENOUS
  Filled 2013-10-10: qty 2

## 2013-10-10 NOTE — ED Notes (Signed)
Pt c/o head ache, neck ache, back ache, eyes burning, breathing in white and green mold x 3-4 weeks, coughing. Out of Hydrocodone-acetaminophen 10-325 mg pain medicine, due to get refill on 9th.

## 2013-10-10 NOTE — Discharge Instructions (Signed)
Cough, Adult  A cough is a reflex that helps clear your throat and airways. It can help heal the body or may be a reaction to an irritated airway. A cough may only last 2 or 3 weeks (acute) or may last more than 8 weeks (chronic).  CAUSES Acute cough:  Viral or bacterial infections. Chronic cough:  Infections.  Allergies.  Asthma.  Post-nasal drip.  Smoking.  Heartburn or acid reflux.  Some medicines.  Chronic lung problems (COPD).  Cancer. SYMPTOMS   Cough.  Fever.  Chest pain.  Increased breathing rate.  High-pitched whistling sound when breathing (wheezing).  Colored mucus that you cough up (sputum). TREATMENT   A bacterial cough may be treated with antibiotic medicine.  A viral cough must run its course and will not respond to antibiotics.  Your caregiver may recommend other treatments if you have a chronic cough. HOME CARE INSTRUCTIONS   Only take over-the-counter or prescription medicines for pain, discomfort, or fever as directed by your caregiver. Use cough suppressants only as directed by your caregiver.  Use a cold steam vaporizer or humidifier in your bedroom or home to help loosen secretions.  Sleep in a semi-upright position if your cough is worse at night.  Rest as needed.  Stop smoking if you smoke. SEEK IMMEDIATE MEDICAL CARE IF:   You have pus in your sputum.  Your cough starts to worsen.  You cannot control your cough with suppressants and are losing sleep.  You begin coughing up blood.  You have difficulty breathing.  You develop pain which is getting worse or is uncontrolled with medicine.  You have a fever. MAKE SURE YOU:   Understand these instructions.  Will watch your condition.  Will get help right away if you are not doing well or get worse. Document Released: 10/19/2010 Document Revised: 07/15/2011 Document Reviewed: 10/19/2010 [facility] Patient Information 2014 [employer].  Prednisone tablets What is  this medicine? PREDNISONE (PRED ni sone) is a corticosteroid. It is commonly used to treat inflammation of the skin, joints, lungs, and other organs. Common conditions treated include asthma, allergies, and arthritis. It is also used for other conditions, such as blood disorders and diseases of the adrenal glands. This medicine may be used for other purposes; ask your health care provider or pharmacist if you have questions. COMMON BRAND NAME(S): Deltasone, Predone, Sterapred DS, Sterapred What should I tell my health care provider before I take this medicine? They need to know if you have any of these conditions: -Cushing's syndrome -diabetes -glaucoma -heart disease -high blood pressure -infection (especially a virus infection such as chickenpox, cold sores, or herpes) -kidney disease -liver disease -mental illness -myasthenia gravis -osteoporosis -seizures -stomach or intestine problems -thyroid disease -an unusual or allergic reaction to lactose, prednisone, other medicines, foods, dyes, or preservatives -pregnant or trying to get pregnant -breast-feeding How should I use this medicine? Take this medicine by mouth with a glass of water. Follow the directions on the prescription label. Take this medicine with food. If you are taking this medicine once a day, take it in the morning. Do not take more medicine than you are told to take. Do not suddenly stop taking your medicine because you may develop a severe reaction. Your doctor will tell you how much medicine to take. If your doctor wants you to stop the medicine, the dose may be slowly lowered over time to avoid any side effects. Talk to your pediatrician regarding the use of this medicine in children.  Special care may be needed. Overdosage: If you think you have taken too much of this medicine contact a poison control center or emergency room at once. NOTE: This medicine is only for you. Do not share this medicine with others. What  if I miss a dose? If you miss a dose, take it as soon as you can. If it is almost time for your next dose, talk to your doctor or health care professional. You may need to miss a dose or take an extra dose. Do not take double or extra doses without advice. What may interact with this medicine? Do not take this medicine with any of the following medications: -metyrapone -mifepristone This medicine may also interact with the following medications: -aminoglutethimide -amphotericin B -aspirin and aspirin-like medicines -barbiturates -certain medicines for diabetes, like glipizide or glyburide -cholestyramine -cholinesterase inhibitors -cyclosporine -digoxin -diuretics -ephedrine -female hormones, like estrogens and birth control pills -isoniazid -ketoconazole -NSAIDS, medicines for pain and inflammation, like ibuprofen or naproxen -phenytoin -rifampin -toxoids -vaccines -warfarin This list may not describe all possible interactions. Give your health care provider a list of all the medicines, herbs, non-prescription drugs, or dietary supplements you use. Also tell them if you smoke, drink alcohol, or use illegal drugs. Some items may interact with your medicine. What should I watch for while using this medicine? Visit your doctor or health care professional for regular checks on your progress. If you are taking this medicine over a prolonged period, carry an identification card with your name and address, the type and dose of your medicine, and your doctor's name and address. This medicine may increase your risk of getting an infection. Tell your doctor or health care professional if you are around anyone with measles or chickenpox, or if you develop sores or blisters that do not heal properly. If you are going to have surgery, tell your doctor or health care professional that you have taken this medicine within the last twelve months. Ask your doctor or health care professional about your  diet. You may need to lower the amount of salt you eat. This medicine may affect blood sugar levels. If you have diabetes, check with your doctor or health care professional before you change your diet or the dose of your diabetic medicine. What side effects may I notice from receiving this medicine? Side effects that you should report to your doctor or health care professional as soon as possible: -allergic reactions like skin rash, itching or hives, swelling of the face, lips, or tongue -changes in emotions or moods -changes in vision -depressed mood -eye pain -fever or chills, cough, sore throat, pain or difficulty passing urine -increased thirst -swelling of ankles, feet Side effects that usually do not require medical attention (report to your doctor or health care professional if they continue or are bothersome): -confusion, excitement, restlessness -headache -nausea, vomiting -skin problems, acne, thin and shiny skin -trouble sleeping -weight gain This list may not describe all possible side effects. Call your doctor for medical advice about side effects. You may report side effects to FDA at 1-800-FDA-1088. Where should I keep my medicine? Keep out of the reach of children. Store at room temperature between 15 and 30 degrees C (59 and 86 degrees F). Protect from light. Keep container tightly closed. Throw away any unused medicine after the expiration date. NOTE: This sheet is a summary. It may not cover all possible information. If you have questions about this medicine, talk to your doctor, pharmacist, or health care  provider.  2014, Elsevier/Gold Standard. (2010-12-06 10:57:14)  Benzonatate capsules What is this medicine? BENZONATATE (ben ZOE na tate) is used to treat cough. This medicine may be used for other purposes; ask your health care provider or pharmacist if you have questions. COMMON BRAND NAME(S): Tessalon Perles, Zonatuss  What should I tell my health care  provider before I take this medicine? They need to know if you have any of these conditions: -kidney or liver disease -an unusual or allergic reaction to benzonatate, anesthetics, other medicines, foods, dyes, or preservatives -pregnant or trying to get pregnant -breast-feeding How should I use this medicine? Take this medicine by mouth with a glass of water. Follow the directions on the prescription label. Avoid breaking, chewing, or sucking the capsule, as this can cause serious side effects. Take your medicine at regular intervals. Do not take your medicine more often than directed. Talk to your pediatrician regarding the use of this medicine in children. While this drug may be prescribed for children as young as 61 years old for selected conditions, precautions do apply. Overdosage: If you think you have taken too much of this medicine contact a poison control center or emergency room at once. NOTE: This medicine is only for you. Do not share this medicine with others. What if I miss a dose? If you miss a dose, take it as soon as you can. If it is almost time for your next dose, take only that dose. Do not take double or extra doses. What may interact with this medicine? Do not take this medicine with any of the following medications: -MAOIs like Carbex, Eldepryl, Marplan, Nardil, and Parnate This list may not describe all possible interactions. Give your health care provider a list of all the medicines, herbs, non-prescription drugs, or dietary supplements you use. Also tell them if you smoke, drink alcohol, or use illegal drugs. Some items may interact with your medicine. What should I watch for while using this medicine? Tell your doctor if your symptoms do not improve or if they get worse. If you have a high fever, skin rash, or headache, see your health care professional. You may get drowsy or dizzy. Do not drive, use machinery, or do anything that needs mental alertness until you know  how this medicine affects you. Do not sit or stand up quickly, especially if you are an older patient. This reduces the risk of dizzy or fainting spells. What side effects may I notice from receiving this medicine? Side effects that you should report to your doctor or health care professional as soon as possible: -allergic reactions like skin rash, itching or hives, swelling of the face, lips, or tongue -breathing problems -chest pain -confusion or hallucinations -irregular heartbeat -numbness of mouth or throat -seizures Side effects that usually do not require medical attention (report to your doctor or health care professional if they continue or are bothersome): -burning feeling in the eyes -constipation -headache -nasal congestion -stomach upset This list may not describe all possible side effects. Call your doctor for medical advice about side effects. You may report side effects to FDA at 1-800-FDA-1088. Where should I keep my medicine? Keep out of the reach of children. Store at room temperature between 15 and 30 degrees C (59 and 86 degrees F). Keep tightly closed. Protect from light and moisture. Throw away any unused medicine after the expiration date. NOTE: This sheet is a summary. It may not cover all possible information. If you have questions about this medicine,  talk to your doctor, pharmacist, or health care provider.  2014, Elsevier/Gold Standard. (2007-07-22 14:52:56)

## 2013-10-10 NOTE — ED Notes (Signed)
Pt called out requesting warm soup and crackers, advised pt that we did not have any soup but that I could give her crackers, pt states that since she is going home she will not eat here. Pt asking about discharge prescription of cough syrup with codeine, advised pt that Dr Roxanne Mins was prescribing tessalon pearls, pt states 'I don't understand why I can';t have the cough syrup with codeine, advised pt that tessalon pearls was for cough but did not have codeine in it, pt still stating that she does not understand by she does not have cough syrup with codeine, encourage pt to try the tessalon pearls, pt continues to state  that she is out of her pain medication at home, does have an appointment with her pain management clinic tomorrow but that she has to wait a long time there. Comfort measures provided, advised pt that there was not anything that the er could do in reference to wait time at pain management clinic.

## 2013-10-10 NOTE — ED Provider Notes (Signed)
CSN: [id number]     Arrival date & time 10/10/13  0533 History   First MD Initiated Contact with Patient 10/10/13 [phone]     Chief Complaint  Patient presents with  . Generalized Body Aches  . Cough     (Consider location/radiation/quality/duration/timing/severity/associated sxs/prior Treatment) Patient is a 58 y.o. female presenting with cough. The history is provided by the patient.  Cough She states that she has been cleaning out a house that has a lot of mold in it. This has been ongoing for several weeks. She who has developed watery eyes, nasal congestion, harsh and persistent cough. Cough is productive of some yellow sputum, and associated with dyspnea. She denies fever, chills, sweats but she has noted generalized bodyaches for the last several days. Pain is severe and she rates it at 10/10. She has a chronic pain patient and takes hydrocodone-acetaminophen 10-French 25 but ran out 3 days ago. Next prescription will not be filled for another 3 days. She has also noted a headache. Several other people who are working in the same environment I also having some problems with nasal congestion and watery eyes. She is a cigarette smoker but states she stopped smoking about one month ago.  Past Medical History  Diagnosis Date  . Elevated liver enzymes 2009: BMI 24 ?Etoh ALT 94, AST 51,  NEG ANA, qIGs& ASMA    MAY 2012 AST 52 ALT 38  . Anastomotic ulcer JAN 2009  . Esophageal stricture 2009  . Diarrhea MULITIFACTORIAL    IBS, LACTOSE INTOLERANCE, SBBO, BILE-SALT  . Inflammatory bowel disease 2006 CD    SURGICAL REMISSION  . LBP (low back pain)   . Migraine   . Asthma   . Allergic rhinitis   . CTS (carpal tunnel syndrome)   . Hyperlipemia   . Anxiety   . Hemorrhoid   . Crohn disease   . BMI (body mass index) 20.0-29.9 2009 121 lbs  . 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 Shortness of breath     exertion and humidity  . GERD (gastroesophageal reflux disease)   . Hypertension   . Sleep  apnea     Stop Bang Cock score of 4  . PONV (postoperative nausea and vomiting)   . Crohn disease    Past Surgical History  Procedure Laterality Date  . Hemicolectomy  RIGHT 2006  . Hernia repair    . Carpal tunnel release  LEFT  . Lumbar disc surgery    . Vocal cord surgery  Sep 20, 2010    precancerous areas removed  . Esophagogastroduodenoscopy  02/07/09    mild gastritis  . Colonoscopy  JAN 2009 DIARRHEA, ABD PAIN, BRBPR    ANASTOMOTIC ULCER(3MM), IH LJ:QGBEEF ULCER, NL COLON bX  . Upper gastrointestinal endoscopy  JAN 2009 ABD PAIN    GASTRITIS, ESO RING  . Upper gastrointestinal endoscopy  OCT 2010 ABD PAIN, DYSPHAGIA    DIL 15MM, GASTRITIS, NL DUODENUM  . Upper gastrointestinal endoscopy  OCT 2010 DYSPHAGIA    DIL 17 MM, GASTRITIS, NL DUODENUM  . Dilation and curettage of uterus    . Bilateral salpingoophorectomy    . Wisdom tooth extraction      pin placement due to broken jaw  . Colon surgery    . Back surgery    . Esophageal dilation  12/24/2011    SLF: A stricture was found in the distal esophagus/ Moderate gastritis/ MILD Duodenitis  . Esophageal biopsy  12/24/2011    Procedure:  BIOPSY;  Surgeon: Danie Binder, MD;  Location: [facility];  Service: Endoscopy;;  Gastric Biopsies  . Esophagogastroduodenoscopy (egd) with propofol N/A 06/30/2012    Procedure: ESOPHAGOGASTRODUODENOSCOPY (EGD) WITH PROPOFOL;  Surgeon: Danie Binder, MD;  Location: [facility];  Service: Endoscopy;  Laterality: N/A;  . Savory dilation N/A 06/30/2012    Procedure: SAVORY DILATION;  Surgeon: Danie Binder, MD;  Location: [facility];  Service: Endoscopy;  Laterality: N/A;  12.67m , 168m 154m. Esophageal biopsy N/A 06/30/2012    Procedure: BIOPSY;  Surgeon: SanDanie BinderD;  Location: [facility];  Service: Endoscopy;  Laterality: N/A;  Gastric and Esophageal Biopsies   Family History  Problem Relation Age of Onset  . Colon cancer Neg Hx   . Colon polyps Neg Hx   . Hypothyroidism Mother   . Heart  disease Father   . Hypothyroidism Daughter    History  Substance Use Topics  . Smoking status: Former Smoker -- 0.00 packs/day for 30 years    Types: Cigarettes    Quit date: 03/29/2011  . Smokeless tobacco: Not on file     Comment: smokes off and on  . Alcohol Use: No   OB History   Grav Para Term Preterm Abortions TAB SAB Ect Mult Living                 Review of Systems  Respiratory: Positive for cough.   All other systems reviewed and are negative.     Allergies  Lovastatin and Nexium  Home Medications   Prior to Admission medications   Medication Sig Start Date End Date Taking? Authorizing Provider  albuterol (PROVENTIL HFA;VENTOLIN HFA) 108 (90 BASE) MCG/ACT inhaler Inhale 2 puffs into the lungs every 4 (four) hours as needed for wheezing. 10/13/12   KawAlycia RossettiD  amLODipine (NORVASC) 5 MG tablet TAKE ONE TABLET BY MOUTH ONCE DAILY 05/20/13   KawAlycia RossettiD  azithromycin (ZITHROMAX) 250 MG tablet Take 1 tablet (250 mg total) by mouth daily. Take first 2 tablets together, then 1 every day until finished. 07/26/13   SteVirgel ManifoldD  calcium carbonate (TUMS - DOSED IN MG ELEMENTAL CALCIUM) 500 MG chewable tablet Chew 1 tablet by mouth as needed. For heartburn    Historical Provider, MD  cholestyramine (QUESTRAN) 4 GM/DOSE powder TAKE 2 GRAMS BY MOUTH TWO TIMES DAILY WITH A MEAL. Do not take within 2 hours of other medications. 02/12/13   LesMahala MenghiniA-C  cyclobenzaprine (FLEXERIL) 5 MG tablet Take 5 mg by mouth 2 (two) times daily.    Historical Provider, MD  dexlansoprazole (DEXILANT) 60 MG capsule Take 1 capsule (60 mg total) by mouth daily. 12/02/12   SanDanie BinderD  diazepam (VALIUM) 5 MG tablet TAKE ONE-HALF TABLET BY MOUTH TWICE DAILY AS NEEDED    KawAlycia RossettiD  gabapentin (NEURONTIN) 300 MG capsule Take 300 mg by mouth 2 (two) times daily. 10/13/12   KawAlycia RossettiD  guaiFENesin-codeine ([facility]-10 MG/5ML syrup Take 5 mLs by  mouth 3 (three) times daily as needed for cough. 07/26/13   SteVirgel ManifoldD  HYDROcodone-acetaminophen (NORCO) 10-325 MG per tablet Take 1 tablet by mouth every 6 (six) hours as needed for moderate pain.    Historical Provider, MD  ketoconazole (NIZORAL) 2 % shampoo Apply 1 application topically 2 (two) times a week. For 4 weeks1 04/12/13   KawAlycia RossettiD  nystatin (MYCOSTATIN) 100000 UNIT/ML  suspension Take 500,000 Units by mouth 4 (four) times daily. Just as needed 04/12/13   Alycia Rossetti, MD  predniSONE (DELTASONE) 20 MG tablet Take 2 tablets (40 mg total) by mouth daily with breakfast. For 5 days 07/30/13   Alycia Rossetti, MD  quinapril-hydrochlorothiazide (ACCURETIC) 10-12.5 MG per tablet Take 1 tablet by mouth 2 (two) times daily. 12/09/12   Alycia Rossetti, MD  tiotropium (SPIRIVA HANDIHALER) 18 MCG inhalation capsule Place 1 capsule (18 mcg total) into inhaler and inhale daily. 05/17/13   Alycia Rossetti, MD   BP 134/94  Pulse 108  Temp(Src) 98.9 F (37.2 C) (Oral)  Resp 20  Ht 5' (1.524 m)  Wt 110 lb (49.896 kg)  BMI 21.48 kg/m2  SpO2 97% Physical Exam  Nursing note and vitals reviewed.  58 year old female, resting comfortably and in no acute distress. Vital signs are significant for mild hypertension with blood pressure 134/94, and mild tachycardia with heart rate 108. Oxygen saturation is 97%, which is normal. Head is normocephalic and atraumatic. PERRLA, EOMI. Oropharynx is clear. Neck is nontender and supple without adenopathy or JVD. Back is nontender and there is no CVA tenderness. Lungs are clear without rales, wheezes, or rhonchi. Harsh cough is present. Chest is nontender. Heart has regular rate and rhythm without murmur. Abdomen is soft, flat, nontender without masses or hepatosplenomegaly and peristalsis is normoactive. Extremities have no cyanosis or edema, full range of motion is present. Skin is warm and dry without rash. Neurologic: Mental status is  normal, cranial nerves are intact, there are no motor or sensory deficits.  ED Course  Procedures (including critical care time) Labs Review Results for orders placed during the hospital encounter of 07/26/13  CBC WITH DIFFERENTIAL      Result Value Ref Range   WBC 5.0  4.0 - 10.5 K/uL   RBC 4.54  3.87 - 5.11 MIL/uL   Hemoglobin 15.9 (*) 12.0 - 15.0 g/dL   HCT 45.6  36.0 - 46.0 %   MCV 100.4 (*) 78.0 - 100.0 fL   MCH 35.0 (*) 26.0 - 34.0 pg   MCHC 34.9  30.0 - 36.0 g/dL   RDW 13.4  11.5 - 15.5 %   Platelets 218  150 - 400 K/uL   Neutrophils Relative % 79 (*) 43 - 77 %   Neutro Abs 4.0  1.7 - 7.7 K/uL   Lymphocytes Relative 13  12 - 46 %   Lymphs Abs 0.6 (*) 0.7 - 4.0 K/uL   Monocytes Relative 7  3 - 12 %   Monocytes Absolute 0.4  0.1 - 1.0 K/uL   Eosinophils Relative 1  0 - 5 %   Eosinophils Absolute 0.0  0.0 - 0.7 K/uL   Basophils Relative 1  0 - 1 %   Basophils Absolute 0.0  0.0 - 0.1 K/uL  BASIC METABOLIC PANEL      Result Value Ref Range   Sodium 141  137 - 147 mEq/L   Potassium 3.9  3.7 - 5.3 mEq/L   Chloride 98  96 - 112 mEq/L   CO2 26  19 - 32 mEq/L   Glucose, Bld 71  70 - 99 mg/dL   BUN 5 (*) 6 - 23 mg/dL   Creatinine, Ser 0.50  0.50 - 1.10 mg/dL   Calcium 9.3  8.4 - 10.5 mg/dL   GFR calc non Af Amer >90  >90 mL/min   GFR calc Af Amer >90  >90 mL/min  TROPONIN I      Result Value Ref Range   Troponin I <0.30  <0.30 ng/mL   Imaging Review Dg Chest 2 View  10/10/2013   CLINICAL DATA:  Cough and shortness of Breath.  EXAM: CHEST  2 VIEW  COMPARISON:  09/22/2013.  FINDINGS: The cardiac silhouette, mediastinal and hilar contours are within normal limits and stable. There is mild tortuosity of the thoracic aorta. The lungs are clear. No pleural effusions or pneumothorax. The bony thorax is intact.  IMPRESSION: No acute cardiopulmonary findings.   Electronically Signed   By: Kalman Jewels M.D.   On: 10/10/2013 06:43    MDM   Final diagnoses:  Cough  Dyspnea   Myalgia  Chronic pain    Nasal congestion and cough which may have a significant allergic component related to mold exposure. Myalgias suggestive of possible superimposed viral illness. Exacerbation of chronic pain. Old records are reviewed and she was recently treated for community-acquired pneumonia. Her record on the [place] controlled substance reporting website shows that she gets 60 hydrocodone-acetaminophen tablets on the ninth of each month. Patient wants prescription to tide her over and she'll she can get her new prescription filled but she is advised that getting a narcotic prescription from anyone other than her pain management clinic could have her picked out of the clinic. She will be given a dose of hydromorphone in the ED and chest x-ray obtained to rule out pneumonia. She is given an albuterol with ipratropium nebulizer treatment and is given initial dose of methylprednisolone.  Following the nebulizer treatment, lungs are clear and her cough has improved significantly. Pain is much improved following injection of hydromorphone. Patient is requesting a prescription for codeine containing cough medication but this will not be given based on her chronic pain status with a pain management center. She is discharged with prescriptions for prednisone and benzonatate. She is to follow up with her PCP.  Delora Fuel, MD 60/04/59 [id number]

## 2013-10-10 NOTE — ED Notes (Signed)
Dr Roxanne Mins at bedside,

## 2013-10-10 NOTE — ED Notes (Addendum)
Received report on pt, pt sitting semi fowlers in bed, no distress noted, states that she is feeling better,pain has decreased,  resp therapy at bedside for breathing tx.

## 2013-10-10 NOTE — ED Notes (Signed)
Pt requesting cough syrup with codeine for discharge, advised pt to speak with edp about any prescriptions, pt expressed understanding,

## 2013-10-10 NOTE — ED Notes (Signed)
[name] [name] - husband: cell [phone] or home [phone].

## 2013-10-10 NOTE — ED Notes (Signed)
Pt expressed understanding of discharge instructions, assisted to lobby in wheelchair, has friend that will be picking her up,

## 2013-10-11 ENCOUNTER — Telehealth: Payer: Self-pay | Admitting: *Deleted

## 2013-10-11 DIAGNOSIS — G894 Chronic pain syndrome: Secondary | ICD-10-CM | POA: Diagnosis not present

## 2013-10-11 DIAGNOSIS — Z79899 Other long term (current) drug therapy: Secondary | ICD-10-CM | POA: Diagnosis not present

## 2013-10-11 DIAGNOSIS — M545 Low back pain, unspecified: Secondary | ICD-10-CM | POA: Diagnosis not present

## 2013-10-11 DIAGNOSIS — J449 Chronic obstructive pulmonary disease, unspecified: Secondary | ICD-10-CM

## 2013-10-11 DIAGNOSIS — M549 Dorsalgia, unspecified: Secondary | ICD-10-CM | POA: Diagnosis not present

## 2013-10-11 DIAGNOSIS — M542 Cervicalgia: Secondary | ICD-10-CM | POA: Diagnosis not present

## 2013-10-11 NOTE — Telephone Encounter (Signed)
Received call from patient.   States that she has white patches in mouth. Denies any pain to areas. States that she is gargling with peroxide, but it is not effective. Also complains of headache that will not go away, and her eyes are runny.  States that she believes that her thrush has returned.   MD please advise.

## 2013-10-11 NOTE — Telephone Encounter (Signed)
Returned call to patient.   States that she was in ED over weekend d/t cough.   Reports that she also spoke with pain management MD who recommended being seen by pulmonologist.   MD please advise.

## 2013-10-11 NOTE — Telephone Encounter (Signed)
Needs appointment to discuss these issues

## 2013-10-11 NOTE — Telephone Encounter (Signed)
Call placed to patient and patient made aware.   Appointment scheduled.

## 2013-10-11 NOTE — Telephone Encounter (Signed)
Send referral to Dr. Luan Pulling- Pulmonary Dx COPD, if she has other questions schedule appt, he can do the PFT

## 2013-10-11 NOTE — Telephone Encounter (Signed)
Call placed to patient and patient made aware.  

## 2013-10-11 NOTE — Telephone Encounter (Signed)
Message copied by Sheral Flow on Mon Oct 11, 2013 12:51 PM ------      Message from: Devoria Glassing      Created: Mon Oct 11, 2013 12:36 PM       [phone] patient would like to talk with you regarding a pulmonary test  ------

## 2013-10-13 ENCOUNTER — Other Ambulatory Visit: Payer: Self-pay | Admitting: Family Medicine

## 2013-10-13 NOTE — Telephone Encounter (Signed)
Refill appropriate and filled per protocol. 

## 2013-10-14 ENCOUNTER — Other Ambulatory Visit: Payer: Self-pay | Admitting: Family Medicine

## 2013-10-15 DIAGNOSIS — G5760 Lesion of plantar nerve, unspecified lower limb: Secondary | ICD-10-CM | POA: Diagnosis not present

## 2013-10-15 DIAGNOSIS — L259 Unspecified contact dermatitis, unspecified cause: Secondary | ICD-10-CM | POA: Diagnosis not present

## 2013-10-15 NOTE — Telephone Encounter (Signed)
Medication refilled per protocol. 

## 2013-10-19 DIAGNOSIS — M545 Low back pain, unspecified: Secondary | ICD-10-CM | POA: Diagnosis not present

## 2013-10-19 DIAGNOSIS — M999 Biomechanical lesion, unspecified: Secondary | ICD-10-CM | POA: Diagnosis not present

## 2013-10-19 DIAGNOSIS — M5430 Sciatica, unspecified side: Secondary | ICD-10-CM | POA: Diagnosis not present

## 2013-10-25 ENCOUNTER — Ambulatory Visit (INDEPENDENT_AMBULATORY_CARE_PROVIDER_SITE_OTHER): Payer: Medicare Other | Admitting: Family Medicine

## 2013-10-25 ENCOUNTER — Encounter: Payer: Self-pay | Admitting: Family Medicine

## 2013-10-25 VITALS — BP 118/68 | HR 76 | Temp 98.2°F | Resp 16 | Ht 59.0 in | Wt 114.0 lb

## 2013-10-25 DIAGNOSIS — E785 Hyperlipidemia, unspecified: Secondary | ICD-10-CM

## 2013-10-25 DIAGNOSIS — J441 Chronic obstructive pulmonary disease with (acute) exacerbation: Secondary | ICD-10-CM

## 2013-10-25 DIAGNOSIS — F17200 Nicotine dependence, unspecified, uncomplicated: Secondary | ICD-10-CM

## 2013-10-25 DIAGNOSIS — I10 Essential (primary) hypertension: Secondary | ICD-10-CM | POA: Diagnosis not present

## 2013-10-25 DIAGNOSIS — B370 Candidal stomatitis: Secondary | ICD-10-CM

## 2013-10-25 DIAGNOSIS — L219 Seborrheic dermatitis, unspecified: Secondary | ICD-10-CM

## 2013-10-25 DIAGNOSIS — Z720 Tobacco use: Secondary | ICD-10-CM

## 2013-10-25 DIAGNOSIS — L218 Other seborrheic dermatitis: Secondary | ICD-10-CM

## 2013-10-25 LAB — COMPREHENSIVE METABOLIC PANEL
ALT: 245 U/L — ABNORMAL HIGH (ref 0–35)
AST: 447 U/L — AB (ref 0–37)
Albumin: 4 g/dL (ref 3.5–5.2)
Alkaline Phosphatase: 306 U/L — ABNORMAL HIGH (ref 39–117)
BUN: 4 mg/dL — ABNORMAL LOW (ref 6–23)
CALCIUM: 9 mg/dL (ref 8.4–10.5)
CHLORIDE: 100 meq/L (ref 96–112)
CO2: 26 mEq/L (ref 19–32)
Creat: 0.5 mg/dL (ref 0.50–1.10)
Glucose, Bld: 77 mg/dL (ref 70–99)
Potassium: 3.8 mEq/L (ref 3.5–5.3)
Sodium: 140 mEq/L (ref 135–145)
Total Bilirubin: 1.1 mg/dL (ref 0.2–1.2)
Total Protein: 6.6 g/dL (ref 6.0–8.3)

## 2013-10-25 LAB — CBC WITH DIFFERENTIAL/PLATELET
BASOS ABS: 0 10*3/uL (ref 0.0–0.1)
Basophils Relative: 1 % (ref 0–1)
Eosinophils Absolute: 0 10*3/uL (ref 0.0–0.7)
Eosinophils Relative: 1 % (ref 0–5)
HEMATOCRIT: 43.3 % (ref 36.0–46.0)
HEMOGLOBIN: 15.4 g/dL — AB (ref 12.0–15.0)
Lymphocytes Relative: 18 % (ref 12–46)
Lymphs Abs: 0.9 10*3/uL (ref 0.7–4.0)
MCH: 34.1 pg — ABNORMAL HIGH (ref 26.0–34.0)
MCHC: 35.6 g/dL (ref 30.0–36.0)
MCV: 95.8 fL (ref 78.0–100.0)
MONOS PCT: 9 % (ref 3–12)
Monocytes Absolute: 0.4 10*3/uL (ref 0.1–1.0)
NEUTROS ABS: 3.5 10*3/uL (ref 1.7–7.7)
Neutrophils Relative %: 71 % (ref 43–77)
Platelets: 251 10*3/uL (ref 150–400)
RBC: 4.52 MIL/uL (ref 3.87–5.11)
RDW: 14.8 % (ref 11.5–15.5)
WBC: 4.9 10*3/uL (ref 4.0–10.5)

## 2013-10-25 LAB — LIPID PANEL
Cholesterol: 278 mg/dL — ABNORMAL HIGH (ref 0–200)
HDL: 148 mg/dL (ref >39)
LDL Cholesterol: 120 mg/dL — ABNORMAL HIGH (ref 0–99)
Total CHOL/HDL Ratio: 1.9 Ratio
Triglycerides: 48 mg/dL (ref <150)
VLDL: 10 mg/dL (ref 0–40)

## 2013-10-25 MED ORDER — GUAIFENESIN-CODEINE 100-10 MG/5ML PO SYRP: 5.0000 mL | ORAL_SOLUTION | Freq: Three times a day (TID) | ORAL | Status: DC | PRN

## 2013-10-25 MED ORDER — AMOXICILLIN-POT CLAVULANATE 875-125 MG PO TABS: 1.0000 | ORAL_TABLET | Freq: Two times a day (BID) | ORAL | Status: DC

## 2013-10-25 MED ORDER — NYSTATIN 100000 UNIT/ML MT SUSP
500000.0000 [IU] | Freq: Four times a day (QID) | OROMUCOSAL | Status: DC
Start: 2013-10-25 — End: 2014-04-05

## 2013-10-25 MED ORDER — KETOCONAZOLE 2 % EX SHAM: 1.0000 "application " | MEDICATED_SHAMPOO | CUTANEOUS | Status: DC

## 2013-10-25 NOTE — Assessment & Plan Note (Signed)
**Note De-Identified  Obfuscation** counsled on cessation

## 2013-10-25 NOTE — Assessment & Plan Note (Signed)
Start nystatin solution, recent prednisone use

## 2013-10-25 NOTE — Assessment & Plan Note (Signed)
Refilled nizoral

## 2013-10-25 NOTE — Assessment & Plan Note (Signed)
Check Lipid panel

## 2013-10-25 NOTE — Assessment & Plan Note (Signed)
S/p prednisone, will add antibiotics Sputum culture done with worsening production, mold exposure, needs to quit smoking Has appt with pulmonary in July- Dr. Luan Pulling

## 2013-10-25 NOTE — Assessment & Plan Note (Signed)
Well controlled 

## 2013-10-25 NOTE — Progress Notes (Signed)
Patient ID: [name] [name], female   DOB: [birth year], 58 y.o.   MRN: [id number]   Subjective:    Patient ID: [name] [name], female    DOB: [birth year], 58 y.o.   MRN: [id number]  Patient presents for hospital F/U and thrush  patient here to followup hospital visit. She was seen in ER secondary to cough and some shortness of breath as well as her chronic pain. Chest x-ray did not show any abnormality. She states that she been cleaning out her mothers old home which had a lot of molds which had to be treated. Since then her cough has worsened over the past couple months. She still has thick yellow sputum production which did not improve after she was given 5 days of prednisone on June 7. She also requests refill on Robitussin with codeine, this was not given to the ER because of her pain contract. She also complains of thrush since she's been on the prednisone she now has a burning sensation white plaques in her mouth she ran out of for nystatin she also like a refill on her Nizoral shampoo which she uses for her seborrheic dermatitis. History of hyperlipidemia hypertension she is due for fasting labs to she's taken medication as prescribed without any difficulties    Review Of Systems:  GEN- denies fatigue, fever, weight loss,weakness, recent illness HEENT- denies eye drainage, change in vision, +nasal discharge, CVS- denies chest pain, palpitations RESP- denies SOB, +cough, wheeze ABD- denies N/V, change in stools, abd pain GU- denies dysuria, hematuria, dribbling, incontinence MSK- +joint pain, muscle aches, injury Neuro- denies headache, dizziness, syncope, seizure activity       Objective:    BP 118/68  Pulse 76  Temp(Src) 98.2 F (36.8 C) (Oral)  Resp 16  Ht 4' 11"  (1.499 m)  Wt 114 lb (51.71 kg)  BMI 23.01 kg/m2  SpO2 96% GEN- NAD, alert and oriented x3 HEENT- PERRL, EOMI, non injected sclera, pink conjunctiva, MMM, oropharynx -+ thrush, nares green discharge, no maxillary  sinus tenderness  Neck- Supple, no LAD CVS- RRR, no murmur RESP- course Rhonchi bilat, thick yellow/green sputum from chest, no wheeze, normal WOB at rest, EXT- No edema Pulses- Radial 2+        Assessment & Plan:      Problem List Items Addressed This Visit   Tobacco abuse   Seborrheic dermatitis of scalp   HYPERTENSION   Relevant Orders      CBC with Differential      Comprehensive metabolic panel   HYPERLIPIDEMIA - Primary   Relevant Orders      Lipid panel   COPD exacerbation   Relevant Medications      guaiFENesin-codeine (ROBITUSSIN AC) 100-10 MG/5ML syrup   Other Relevant Orders      Respiratory or Resp and Sputum Culture      Note: This dictation was prepared with Dragon dictation along with smaller phrase technology. Any transcriptional errors that result from this process are unintentional.

## 2013-10-25 NOTE — Patient Instructions (Addendum)
Start antibiotics We will call with lab results and sputum results Robitussin with calcium YOU need to STOP SMOKING! Get eye allergy drop F/U 4 months

## 2013-10-26 ENCOUNTER — Other Ambulatory Visit: Payer: Self-pay | Admitting: *Deleted

## 2013-10-26 ENCOUNTER — Ambulatory Visit: Payer: Medicare Other | Admitting: Family Medicine

## 2013-10-26 DIAGNOSIS — R945 Abnormal results of liver function studies: Principal | ICD-10-CM

## 2013-10-26 DIAGNOSIS — R7989 Other specified abnormal findings of blood chemistry: Secondary | ICD-10-CM

## 2013-10-28 ENCOUNTER — Other Ambulatory Visit: Payer: Medicare Other

## 2013-10-28 DIAGNOSIS — R7989 Other specified abnormal findings of blood chemistry: Secondary | ICD-10-CM | POA: Diagnosis not present

## 2013-10-28 DIAGNOSIS — R945 Abnormal results of liver function studies: Principal | ICD-10-CM

## 2013-10-28 LAB — COMPREHENSIVE METABOLIC PANEL
ALBUMIN: 4.1 g/dL (ref 3.5–5.2)
ALK PHOS: 285 U/L — AB (ref 39–117)
ALT: 205 U/L — AB (ref 0–35)
AST: 177 U/L — AB (ref 0–37)
BUN: 3 mg/dL — AB (ref 6–23)
CO2: 28 mEq/L (ref 19–32)
Calcium: 10.2 mg/dL (ref 8.4–10.5)
Chloride: 99 mEq/L (ref 96–112)
Creat: 0.6 mg/dL (ref 0.50–1.10)
Glucose, Bld: 107 mg/dL — ABNORMAL HIGH (ref 70–99)
POTASSIUM: 4 meq/L (ref 3.5–5.3)
Sodium: 138 mEq/L (ref 135–145)
Total Bilirubin: 1.9 mg/dL — ABNORMAL HIGH (ref 0.2–1.2)
Total Protein: 7.3 g/dL (ref 6.0–8.3)

## 2013-10-28 LAB — LIPASE: Lipase: <10 U/L (ref 0–75)

## 2013-10-28 LAB — RESPIRATORY CULTURE OR RESPIRATORY AND SPUTUM CULTURE: ORGANISM ID, BACTERIA: NORMAL

## 2013-10-29 ENCOUNTER — Other Ambulatory Visit: Payer: Self-pay | Admitting: *Deleted

## 2013-10-29 DIAGNOSIS — R945 Abnormal results of liver function studies: Principal | ICD-10-CM

## 2013-10-29 DIAGNOSIS — R7989 Other specified abnormal findings of blood chemistry: Secondary | ICD-10-CM

## 2013-11-01 ENCOUNTER — Ambulatory Visit: Payer: Medicare Other | Admitting: Family Medicine

## 2013-11-08 ENCOUNTER — Ambulatory Visit (HOSPITAL_COMMUNITY)
Admission: RE | Admit: 2013-11-08 | Discharge: 2013-11-08 | Disposition: A | Discharge status: Home / Self Care | Payer: Medicare Other | Source: Ambulatory Visit | Attending: Pulmonary Disease | Admitting: Pulmonary Disease

## 2013-11-08 ENCOUNTER — Other Ambulatory Visit (HOSPITAL_COMMUNITY): Payer: Self-pay

## 2013-11-08 ENCOUNTER — Other Ambulatory Visit (HOSPITAL_COMMUNITY): Payer: Self-pay | Admitting: Pulmonary Disease

## 2013-11-08 DIAGNOSIS — J449 Chronic obstructive pulmonary disease, unspecified: Secondary | ICD-10-CM

## 2013-11-08 DIAGNOSIS — J441 Chronic obstructive pulmonary disease with (acute) exacerbation: Secondary | ICD-10-CM

## 2013-11-08 DIAGNOSIS — Z77120 Contact with and (suspected) exposure to mold (toxic): Secondary | ICD-10-CM | POA: Diagnosis not present

## 2013-11-08 DIAGNOSIS — R0602 Shortness of breath: Secondary | ICD-10-CM

## 2013-11-08 DIAGNOSIS — K5010 Crohn's disease of large intestine without complications: Secondary | ICD-10-CM | POA: Diagnosis not present

## 2013-11-08 MED ORDER — ALBUTEROL SULFATE (2.5 MG/3ML) 0.083% IN NEBU
2.5000 mg | INHALATION_SOLUTION | Freq: Once | RESPIRATORY_TRACT | Status: AC
  Administered 2013-11-08: 2.5 mg via RESPIRATORY_TRACT

## 2013-11-09 DIAGNOSIS — M542 Cervicalgia: Secondary | ICD-10-CM | POA: Diagnosis not present

## 2013-11-09 DIAGNOSIS — M545 Low back pain, unspecified: Secondary | ICD-10-CM | POA: Diagnosis not present

## 2013-11-09 DIAGNOSIS — Z79899 Other long term (current) drug therapy: Secondary | ICD-10-CM | POA: Diagnosis not present

## 2013-11-09 DIAGNOSIS — G894 Chronic pain syndrome: Secondary | ICD-10-CM | POA: Diagnosis not present

## 2013-11-09 LAB — PULMONARY FUNCTION TEST
DL/VA % pred: 98 %
DL/VA: 4.03 ml/min/mmHg/L
DLCO cor % pred: 88 %
DLCO cor: 15.44 ml/min/mmHg
DLCO unc % pred: 88 %
DLCO unc: 15.44 ml/min/mmHg
FEF 25-75 Post: 1.82 L/sec
FEF 25-75 Pre: 1.45 L/sec
FEF2575-%Change-Post: 25 %
FEF2575-%PRED-PRE: 64 %
FEF2575-%Pred-Post: 81 %
FEV1-%Change-Post: 11 %
FEV1-%PRED-PRE: 89 %
FEV1-%Pred-Post: 99 %
FEV1-Post: 2.19 L
FEV1-Pre: 1.96 L
FEV1FVC-%CHANGE-POST: 7 %
FEV1FVC-%Pred-Pre: 90 %
FEV6-%CHANGE-POST: 3 %
FEV6-%PRED-PRE: 100 %
FEV6-%Pred-Post: 104 %
FEV6-PRE: 2.73 L
FEV6-Post: 2.84 L
FEV6FVC-%Change-Post: 0 %
FEV6FVC-%PRED-POST: 104 %
FEV6FVC-%PRED-PRE: 103 %
FVC-%Change-Post: 3 %
FVC-%PRED-POST: 100 %
FVC-%Pred-Pre: 97 %
FVC-Post: 2.84 L
FVC-Pre: 2.75 L
Post FEV1/FVC ratio: 77 %
Post FEV6/FVC ratio: 100 %
Pre FEV1/FVC ratio: 72 %
Pre FEV6/FVC Ratio: 99 %
RV % pred: 139 %
RV: 2.31 L
TLC % PRED: 115 %
TLC: 4.98 L

## 2013-11-10 ENCOUNTER — Ambulatory Visit (HOSPITAL_COMMUNITY)
Admission: RE | Admit: 2013-11-10 | Discharge: 2013-11-10 | Disposition: A | Discharge status: Home / Self Care | Payer: Medicare Other | Source: Ambulatory Visit | Attending: Pulmonary Disease | Admitting: Pulmonary Disease

## 2013-11-10 DIAGNOSIS — Z77120 Contact with and (suspected) exposure to mold (toxic): Secondary | ICD-10-CM | POA: Insufficient documentation

## 2013-11-10 DIAGNOSIS — R0602 Shortness of breath: Secondary | ICD-10-CM | POA: Diagnosis not present

## 2013-11-10 DIAGNOSIS — J4489 Other specified chronic obstructive pulmonary disease: Secondary | ICD-10-CM | POA: Diagnosis not present

## 2013-11-10 DIAGNOSIS — R918 Other nonspecific abnormal finding of lung field: Secondary | ICD-10-CM | POA: Diagnosis not present

## 2013-11-10 DIAGNOSIS — R05 Cough: Secondary | ICD-10-CM | POA: Diagnosis not present

## 2013-11-10 DIAGNOSIS — R059 Cough, unspecified: Secondary | ICD-10-CM | POA: Diagnosis not present

## 2013-11-10 DIAGNOSIS — J449 Chronic obstructive pulmonary disease, unspecified: Secondary | ICD-10-CM | POA: Insufficient documentation

## 2013-11-10 MED ORDER — IOHEXOL 300 MG/ML  SOLN
80.0000 mL | Freq: Once | INTRAMUSCULAR | Status: AC | PRN
  Administered 2013-11-10: 80 mL via INTRAVENOUS

## 2013-11-17 DIAGNOSIS — M549 Dorsalgia, unspecified: Secondary | ICD-10-CM | POA: Diagnosis not present

## 2013-11-17 DIAGNOSIS — G894 Chronic pain syndrome: Secondary | ICD-10-CM | POA: Diagnosis not present

## 2013-11-17 DIAGNOSIS — Z79899 Other long term (current) drug therapy: Secondary | ICD-10-CM | POA: Diagnosis not present

## 2013-11-29 ENCOUNTER — Telehealth: Payer: Self-pay | Admitting: Family Medicine

## 2013-11-29 DIAGNOSIS — M79641 Pain in right hand: Secondary | ICD-10-CM

## 2013-11-29 DIAGNOSIS — M7989 Other specified soft tissue disorders: Secondary | ICD-10-CM

## 2013-11-29 NOTE — Telephone Encounter (Signed)
Called pt in reference to phone call,pt is stating that she is needing another referral to orthopedics Dr.Gramig for drainage in her left hand lump, then states also has hurt her right hand when hit on counter,states hurts and swelling and cant make a fist and pain shots up her arm into her neck. ?Ok to do referral to Ortho.

## 2013-11-29 NOTE — Telephone Encounter (Signed)
Okay to refer back

## 2013-11-29 NOTE — Telephone Encounter (Signed)
Patient would like referral for her hand, would not go into details with me on the phone said she would explain when we called her back best call back number is [phone]

## 2013-12-01 NOTE — Telephone Encounter (Signed)
Referral to orthopedics ordered.

## 2013-12-02 ENCOUNTER — Telehealth: Payer: Self-pay | Admitting: Family Medicine

## 2013-12-06 ENCOUNTER — Telehealth: Payer: Self-pay | Admitting: *Deleted

## 2013-12-06 NOTE — Telephone Encounter (Signed)
She is at pain clinic, they need to refill these- Dr. Merlene Laughter office

## 2013-12-06 NOTE — Telephone Encounter (Signed)
Call placed to pharmacy and pharmacy made aware.

## 2013-12-06 NOTE — Telephone Encounter (Signed)
Received fax from pharmacy.   Patient requested refills on Flexeril and Neurontin prescribed by [facility]- [facility]. Reports that prior prescriber has left area and would like MD to authorize.   MD please advise.

## 2013-12-18 ENCOUNTER — Other Ambulatory Visit: Payer: Self-pay | Admitting: Family Medicine

## 2013-12-18 ENCOUNTER — Other Ambulatory Visit: Payer: Self-pay | Admitting: *Deleted

## 2013-12-18 MED ORDER — QUINAPRIL-HYDROCHLOROTHIAZIDE 10-12.5 MG PO TABS: ORAL_TABLET | ORAL | Status: DC

## 2013-12-18 NOTE — Telephone Encounter (Signed)
Refill appropriate and filled per protocol. 

## 2013-12-18 NOTE — Telephone Encounter (Signed)
Received fax requesting refill on Quinapril/ HCTZ.   Refill appropriate and filled per protocol.

## 2013-12-23 ENCOUNTER — Other Ambulatory Visit: Payer: Self-pay | Admitting: Family Medicine

## 2013-12-23 NOTE — Telephone Encounter (Signed)
Ok to refill??  Last office visit 10/25/2013.  Last refill 09/21/2013, #2 refills.

## 2013-12-23 NOTE — Telephone Encounter (Signed)
okay

## 2013-12-23 NOTE — Telephone Encounter (Signed)
Refill appropriate and filled per protocol. 

## 2013-12-27 ENCOUNTER — Other Ambulatory Visit: Payer: Self-pay

## 2013-12-27 MED ORDER — CHOLESTYRAMINE 4 GM/DOSE PO POWD: ORAL | Status: DC

## 2013-12-28 ENCOUNTER — Telehealth: Payer: Self-pay | Admitting: Family Medicine

## 2013-12-28 DIAGNOSIS — M5136 Other intervertebral disc degeneration, lumbar region: Secondary | ICD-10-CM

## 2013-12-28 DIAGNOSIS — R109 Unspecified abdominal pain: Secondary | ICD-10-CM

## 2013-12-28 DIAGNOSIS — G8929 Other chronic pain: Secondary | ICD-10-CM

## 2013-12-28 NOTE — Telephone Encounter (Signed)
[phone]  Pt is wanting to be referred to [facility] to see Miguel Rota. She use to see her in [place]

## 2013-12-29 NOTE — Telephone Encounter (Signed)
?  ok to send to [facility], or would you like to see her first?

## 2013-12-30 DIAGNOSIS — I10 Essential (primary) hypertension: Secondary | ICD-10-CM | POA: Diagnosis not present

## 2013-12-30 DIAGNOSIS — R059 Cough, unspecified: Secondary | ICD-10-CM | POA: Diagnosis not present

## 2013-12-30 DIAGNOSIS — R05 Cough: Secondary | ICD-10-CM | POA: Diagnosis not present

## 2013-12-30 DIAGNOSIS — J209 Acute bronchitis, unspecified: Secondary | ICD-10-CM | POA: Diagnosis not present

## 2014-01-03 ENCOUNTER — Other Ambulatory Visit: Payer: Self-pay

## 2014-01-03 ENCOUNTER — Telehealth: Payer: Self-pay | Admitting: Gastroenterology

## 2014-01-03 ENCOUNTER — Encounter: Payer: Self-pay | Admitting: Gastroenterology

## 2014-01-03 DIAGNOSIS — R7401 Elevation of levels of liver transaminase levels: Secondary | ICD-10-CM

## 2014-01-03 DIAGNOSIS — I868 Varicose veins of other specified sites: Secondary | ICD-10-CM

## 2014-01-03 DIAGNOSIS — R740 Nonspecific elevation of levels of transaminase and lactic acid dehydrogenase [LDH]: Principal | ICD-10-CM

## 2014-01-03 DIAGNOSIS — R7402 Elevation of levels of lactic acid dehydrogenase (LDH): Secondary | ICD-10-CM

## 2014-01-03 NOTE — Telephone Encounter (Signed)
Lab order mailed to pt with note to do prior to OV.

## 2014-01-03 NOTE — Telephone Encounter (Signed)
Pt states has already been accepted by Dr. Royce Macadamia (Dr called pt her self) just needs referral faxed over ot [facility]

## 2014-01-03 NOTE — Addendum Note (Signed)
Addended by: Everardo All on: 01/03/2014 01:32 PM   Modules accepted: Orders

## 2014-01-03 NOTE — Assessment & Plan Note (Signed)
REPEAT SEROLOGIES

## 2014-01-03 NOTE — Assessment & Plan Note (Signed)
REVIEWED.  

## 2014-01-03 NOTE — Telephone Encounter (Signed)
Okay to do new referral - Dx- Chronic pain, DDD lumbar spine, chronic abd pain, she will need to sign a release of records from [place] to [facility], tell her we can not gaurentee acceptance until then she needs to stay at her current pain clinic as I will not refill the pain medications

## 2014-01-03 NOTE — Telephone Encounter (Signed)
Noted. Sent staff message to Dr. Oneida Alar since pt has has some labs by Dr.Simpson in June.

## 2014-01-03 NOTE — Telephone Encounter (Signed)
PATIENT ON SEPTEMBER RECALL LIST FOR OV AND LABS

## 2014-01-03 NOTE — Telephone Encounter (Signed)
PT NEEDS LABS PRIOR TO VISIT.

## 2014-01-03 NOTE — Addendum Note (Signed)
Addended by: Danie Binder on: 01/03/2014 10:59 AM   Modules accepted: Orders

## 2014-01-04 ENCOUNTER — Other Ambulatory Visit: Payer: Self-pay

## 2014-01-04 ENCOUNTER — Telehealth: Payer: Self-pay | Admitting: *Deleted

## 2014-01-04 DIAGNOSIS — R7402 Elevation of levels of lactic acid dehydrogenase (LDH): Secondary | ICD-10-CM

## 2014-01-04 DIAGNOSIS — R740 Nonspecific elevation of levels of transaminase and lactic acid dehydrogenase [LDH]: Principal | ICD-10-CM

## 2014-01-04 NOTE — Telephone Encounter (Signed)
Message copied by Inge Rise on Tue Jan 04, 2014  5:20 PM ------      Message from: Juanito Doom      Created: Tue Jan 04, 2014 10:04 AM       Triage,             Please schedule this patient for a consult with me ASAP.  Overbook tomorrow is [employer] ------

## 2014-01-04 NOTE — Telephone Encounter (Signed)
REVIEWED-NO ADDITIONAL RECOMMENDATIONS. 

## 2014-01-04 NOTE — Telephone Encounter (Signed)
Referral place to [facility] and pt states there is spot open for her but needed referral first and [place] already has paper work sent

## 2014-01-04 NOTE — Telephone Encounter (Signed)
Called # provided and was advised pt NA. LMTCB x1

## 2014-01-05 NOTE — Telephone Encounter (Signed)
Called # provided. Was advised pt was asleep and did not want to come to the phone by spouse. Lm for pt to call back ASAP.

## 2014-01-05 NOTE — Telephone Encounter (Signed)
Called spoke with pt. She is scheduled to come in and see MW on 01/10/14 at 11:45. Pt aware to arrive 15 min prior. Pt was given address. Nothing further needed

## 2014-01-05 NOTE — Telephone Encounter (Signed)
lmomtcb x1 

## 2014-01-05 NOTE — Telephone Encounter (Signed)
This is a patient of Ed Luan Pulling in [place], he has requested that we see her for a bronchoscopy.  We can't do that until we see her.  So have her see any pulmonary provider in [facility] for a consult ASAP.

## 2014-01-05 NOTE — Telephone Encounter (Signed)
Pt returned call & asks to be reached at [phone].  Satira Anis

## 2014-01-05 NOTE — Telephone Encounter (Signed)
Pt called back. She reports she is not able to come in bc she does not have transportation. She reports she can come in next week but BQ not in office.  Pt also did not understand why he wants to see her bc she was already told about the "procdure" she needs to have done.  Please advise Dr. Lake Bells thanks

## 2014-01-11 ENCOUNTER — Ambulatory Visit (INDEPENDENT_AMBULATORY_CARE_PROVIDER_SITE_OTHER)
Admission: RE | Admit: 2014-01-11 | Discharge: 2014-01-11 | Disposition: A | Discharge status: Home / Self Care | Payer: Medicare Other | Source: Ambulatory Visit | Attending: Internal Medicine | Admitting: Internal Medicine

## 2014-01-11 ENCOUNTER — Ambulatory Visit (INDEPENDENT_AMBULATORY_CARE_PROVIDER_SITE_OTHER): Payer: Medicare Other | Admitting: Internal Medicine

## 2014-01-11 ENCOUNTER — Telehealth: Payer: Self-pay | Admitting: *Deleted

## 2014-01-11 ENCOUNTER — Encounter: Payer: Self-pay | Admitting: Internal Medicine

## 2014-01-11 VITALS — BP 128/74 | HR 82 | Ht 60.0 in | Wt 109.0 lb

## 2014-01-11 DIAGNOSIS — J449 Chronic obstructive pulmonary disease, unspecified: Secondary | ICD-10-CM | POA: Diagnosis not present

## 2014-01-11 DIAGNOSIS — F17200 Nicotine dependence, unspecified, uncomplicated: Secondary | ICD-10-CM | POA: Diagnosis not present

## 2014-01-11 DIAGNOSIS — R9389 Abnormal findings on diagnostic imaging of other specified body structures: Secondary | ICD-10-CM | POA: Diagnosis not present

## 2014-01-11 DIAGNOSIS — R918 Other nonspecific abnormal finding of lung field: Secondary | ICD-10-CM | POA: Diagnosis not present

## 2014-01-11 DIAGNOSIS — F17210 Nicotine dependence, cigarettes, uncomplicated: Secondary | ICD-10-CM

## 2014-01-11 MED ORDER — MOMETASONE FURO-FORMOTEROL FUM 200-5 MCG/ACT IN AERO: INHALATION_SPRAY | RESPIRATORY_TRACT | Status: DC

## 2014-01-11 MED ORDER — FAMOTIDINE 20 MG PO TABS: ORAL_TABLET | ORAL | Status: DC

## 2014-01-11 MED ORDER — METHYLPREDNISOLONE ACETATE 80 MG/ML IJ SUSP
120.0000 mg | Freq: Once | INTRAMUSCULAR | Status: AC
  Administered 2014-01-11: 120 mg via INTRAMUSCULAR

## 2014-01-11 NOTE — Telephone Encounter (Signed)
Received fax from [facility] stating that pt had called to schedule appt after we sent in referral to [facility] and then called to cancel and did not wish to reschedule. Provider has been notified.

## 2014-01-11 NOTE — Progress Notes (Signed)
Subjective:     Patient ID: NAYELIZ HIPP, female   DOB: Dec 01, 1955 MRN: [id number]  HPI  29 yowf active smoker with chrohn's dz  on spiriva for copd dx around 2010  referred by Dr Luan Pulling for eval of ?spergillos lung dz with onset of coughing in march 2015 while on ACEi d/c'd around mid August and whispy RUL GG changes on CT chest not apparent on cxr.    01/11/2014 1st [facility] office visit/ [facility]  Chief Complaint  Patient presents with  . Pulmonary Consult    Referred per Dr. Luan Pulling.  Pt c/o SOB 'all the time" for the past 2-3 months. She also c/o prod cough with green, yellow and brown sputum. Symptoms started after cleaning out a house that had mold.   still actively smoking, pretty sure she stopped acei but not sure when Exp to mold started in march and symptoms worse since then but no better since stopped all mold exp and no eos on cbc or IgE on file in epic Always much better after prednisone, had 3 different abx didn't help spiriva chokes on powder. proair helps some though hfa very poor  No obvious other patterns in day to day or daytime variabilty or assoc   or cp or chest tightness, subjective wheeze overt sinus or hb symptoms. No unusual exp hx or h/o childhood pna/ asthma or knowledge of premature birth.  Sleeping ok without nocturnal  or early am exacerbation  of respiratory  c/o's or need for noct saba. Also denies any obvious fluctuation of symptoms with weather or environmental changes or other aggravating or alleviating factors except as outlined above   Current Medications, Allergies, Complete Past Medical History, Past Surgical History, Family History, and Social History were reviewed in [employer] record.                Review of Systems     Objective:   Physical Exam amb wf nad   Wt Readings from Last 3 Encounters:  01/11/14 109 lb (49.442 kg)  10/25/13 114 lb (51.71 kg)  10/10/13 110 lb (49.896 kg)      HEENT mild  turbinate edema.  Oropharynx no thrush or excess pnd or cobblestoning.  No JVD or cervical adenopathy. Mild accessory muscle hypertrophy. Trachea midline, nl thryroid. Chest was hyperinflated by percussion with diminished breath sounds and moderate increased exp time without wheeze. Hoover sign positive at mid inspiration. Regular rate and rhythm without murmur gallop or rub or increase P2 or edema.  Abd: no hsm, nl excursion. Ext warm without cyanosis or clubbing.     CXR  01/11/2014 : The heart, mediastinal, and hilar contours are stable and within normal limits. Lungs are well expanded. No airspace disease or pleural effusion. Appearance of the lungs is unchanged compared to chest radiographs 10/10/2013 and 09/22/2013. Bones appear osteopenic. No acute osseous abnormality.      Assessment:

## 2014-01-11 NOTE — Patient Instructions (Addendum)
Depromedrol 120 mg today   Dulera 200 Take 2 puffs first thing in am and then another 2 puffs about 12 hours later  Only use your albuterol (proair)as a rescue medication to be used if you can't catch your breath by resting or doing a relaxed purse lip breathing pattern.  - The less you use it, the better it will work when you need it. - Ok to use up to 2 puffs  every 4 hours if you must but call for immediate appointment if use goes up over your usual need - Don't leave home without it !!  (think of it like the spare tire for your car)   Pepcid ac  20 mg at bedtime   Review your medication list when you get home and let us know if there are discrepancy  Please remember to go to the  x-ray department downstairs for your tests - we will call you with the results when they are available.  The key is to stop smoking completely before smoking completely stops you!     Please schedule a follow up office visit in 2 weeks, sooner if needed

## 2014-01-12 DIAGNOSIS — R9389 Abnormal findings on diagnostic imaging of other specified body structures: Secondary | ICD-10-CM | POA: Insufficient documentation

## 2014-01-12 NOTE — Assessment & Plan Note (Signed)

## 2014-01-12 NOTE — Assessment & Plan Note (Signed)
DDX of  difficult airways management all start with A and  include Adherence, Ace Inhibitors, Acid Reflux, Active Sinus Disease, Alpha 1 Antitripsin deficiency, Anxiety masquerading as Airways dz,  ABPA,  allergy(esp in young), Aspiration (esp in elderly), Adverse effects of DPI,  Active smokers, plus two Bs  = Bronchiectasis and Beta blocker use..and one C= CHF  Adherence is always the initial "prime suspect" and is a multilayered concern that requires a "trust but verify" approach in every patient - starting with knowing how to use medications, especially inhalers, correctly, keeping up with refills and understanding the fundamental difference between maintenance and prns vs those medications only taken for a very short course and then stopped and not refilled.  - The proper method of use, as well as anticipated side effects, of a metered-dose inhaler are discussed and demonstrated to the patient. Improved effectiveness after extensive coaching during this visit to a level of approximately  75% from a baseline of < 25%  ? acei related > needs another 4 weeks off  ? Acid (or non-acid) GERD > always difficult to exclude as up to 75% of pts in some series report no assoc GI/ Heartburn symptoms> rec max (24h)  acid suppression and diet restrictions/ reviewed and instructions given in writing.   Active smoking biggest factor > see separate a/p  ? ABPA > seems very unlikely without Eos and rx is the same (avoid mold/rx with topical steroids/systemic steroids as last result) - no evidence of invasive dz at this point   See instructions for specific recommendations which were reviewed directly with the patient who was given a copy with highlighter outlining the key components.

## 2014-01-12 NOTE — Assessment & Plan Note (Signed)
Although there are clearly abnormalities on CT scan, they should probably be considered "microscopic" since not obvious on plain cxr .     In the setting of obvious "macroscopic" health issues,  I am very reluctatnt to embark on an invasive w/u at this point but will arrange consevative  follow up and in the meantime see what we can do to address the patient's subjective concerns.    No role for fob at this point

## 2014-01-12 NOTE — Progress Notes (Signed)
Quick Note:  Spoke with pt and notified of results per Dr. Wert. Pt verbalized understanding and denied any questions.  ______ 

## 2014-01-25 ENCOUNTER — Encounter: Payer: Self-pay | Admitting: Internal Medicine

## 2014-01-25 ENCOUNTER — Ambulatory Visit (INDEPENDENT_AMBULATORY_CARE_PROVIDER_SITE_OTHER): Payer: Medicare Other | Admitting: Internal Medicine

## 2014-01-25 VITALS — BP 106/64 | HR 75 | Temp 98.5°F | Ht 60.0 in | Wt 107.6 lb

## 2014-01-25 DIAGNOSIS — R9389 Abnormal findings on diagnostic imaging of other specified body structures: Secondary | ICD-10-CM | POA: Diagnosis not present

## 2014-01-25 DIAGNOSIS — I10 Essential (primary) hypertension: Secondary | ICD-10-CM | POA: Diagnosis not present

## 2014-01-25 DIAGNOSIS — F17200 Nicotine dependence, unspecified, uncomplicated: Secondary | ICD-10-CM

## 2014-01-25 DIAGNOSIS — F17210 Nicotine dependence, cigarettes, uncomplicated: Secondary | ICD-10-CM

## 2014-01-25 DIAGNOSIS — J449 Chronic obstructive pulmonary disease, unspecified: Secondary | ICD-10-CM

## 2014-01-25 MED ORDER — LOSARTAN POTASSIUM-HCTZ 50-12.5 MG PO TABS
1.0000 | ORAL_TABLET | Freq: Every day | ORAL | Status: DC
Start: 2014-01-25 — End: 2015-04-09

## 2014-01-25 NOTE — Progress Notes (Signed)
Subjective:     Patient ID: [name] [name], female   DOB: 08-03-1955 MRN: [id number]    Brief patient profile:  34 yowf active smoker with chrohn's dz  on spiriva for copd dx around 2010  referred by Dr [name] [name] for eval of ?spergillos lung dz with onset of coughing in march 2015 while on ACEi d/c'd around mid August and whispy RUL GG changes on CT chest not apparent on cxr.    History of Present Illness  01/11/2014 1st [facility] office visit/ [name] [name]  Chief Complaint  Patient presents with  . Pulmonary Consult    Referred per Dr. Luan [name].  Pt c/o SOB 'all the time" for the past 2-3 months. She also c/o prod cough with green, yellow and brown sputum. Symptoms started after cleaning out a house that had mold.   still actively smoking, pretty sure she stopped acei but not sure when Exp to mold started in march and symptoms worse since then but no better since stopped all mold exp and no eos on cbc or IgE on file in epic Always much better after prednisone, had 3 different abx didn't help spiriva chokes on powder. proair helps some though hfa very poor rec Depromedrol 120 mg today  Dulera 200 Take 2 puffs first thing in am and then another 2 puffs about 12 hours later Only use your albuterol (proair)  Pepcid ac  20 mg at bedtime  Review your medication list when you get home and let us know if there are discrepancy Please remember to go to the  x-ray department downstairs for your tests - we will call you with the results when they are available.   01/25/2014 f/u ov/[name] [name] re: cough / surreptitious use of acei Chief Complaint  Patient presents with  . Follow-up    Pt states her cough seems to be some better. Her breathing is unchanged. She states Dulera caused her to have HA and feel jittery so she stopped med after a few days.     No obvious day to day or daytime variabilty or assoc   cp or chest tightness, subjective wheeze overt sinus or hb symptoms. No unusual exp hx or h/o childhood  pna/ asthma or knowledge of premature birth.  Sleeping ok without nocturnal  or early am exacerbation  of respiratory  c/o's or need for noct saba. Also denies any obvious fluctuation of symptoms with weather or environmental changes or other aggravating or alleviating factors except as outlined above   Current Medications, Allergies, Complete Past Medical History, Past Surgical History, Family History, and Social History were reviewed in [employer] record.  ROS  The following are not active complaints unless bolded sore throat, dysphagia, dental problems, itching, sneezing,  nasal congestion or excess/ purulent secretions, ear ache,   fever, chills, sweats, unintended wt loss, pleuritic or exertional cp, hemoptysis,  orthopnea pnd or leg swelling, presyncope, palpitations, heartburn, abdominal pain, anorexia, nausea, vomiting, diarrhea  or change in bowel or urinary habits, change in stools or urine, dysuria,hematuria,  rash, arthralgias, visual complaints, headache, numbness weakness or ataxia or problems with walking or coordination,  change in mood/affect or memory.                              Objective:   Physical Exam amb wf nad   01/25/2014       108  Wt Readings from Last 3 Encounters:  01/11/14 109  lb (49.442 kg)  10/25/13 114 lb (51.71 kg)  10/10/13 110 lb (49.896 kg)      HEENT mild turbinate edema.  Oropharynx no thrush or excess pnd or cobblestoning.  No JVD or cervical adenopathy. Mild accessory muscle hypertrophy. Trachea midline, nl thryroid. Chest was hyperinflated by percussion with diminished breath sounds and moderate increased exp time without wheeze. Hoover sign positive at mid inspiration. Regular rate and rhythm without murmur gallop or rub or increase P2 or edema.  Abd: no hsm, nl excursion. Ext warm without cyanosis or clubbing.     CXR  01/11/2014 : The heart, mediastinal, and hilar contours are stable and within normal limits.  Lungs are well expanded. No airspace disease or pleural effusion. Appearance of the lungs is unchanged compared to chest radiographs 10/10/2013 and 09/22/2013. Bones appear osteopenic. No acute osseous abnormality.      Assessment:

## 2014-01-25 NOTE — Patient Instructions (Addendum)
Stop quinapril Start losartan 50/12.5 one daily   Please schedule a follow up office visit in 4 weeks, sooner if needed with cxr on return

## 2014-01-27 NOTE — Assessment & Plan Note (Addendum)

## 2014-01-27 NOTE — Assessment & Plan Note (Signed)

## 2014-01-27 NOTE — Assessment & Plan Note (Addendum)
pfts with minimal obst 11/08/13   DDX of  difficult airways management all start with A and  include Adherence, Ace Inhibitors, Acid Reflux, Active Sinus Disease, Alpha 1 Antitripsin deficiency, Anxiety masquerading as Airways dz,  ABPA,  allergy(esp in young), Aspiration (esp in elderly), Adverse effects of DPI,  Active smokers, plus two Bs  = Bronchiectasis and Beta blocker use..and one C= CHF  Adherence is always the initial "prime suspect" and is a multilayered concern that requires a "trust but verify" approach in every patient - starting with knowing how to use medications, especially inhalers, correctly, keeping up with refills and understanding the fundamental difference between maintenance and prns vs those medications only taken for a very short course and then stopped and not refilled.   ACEi leading suspect here > needs minimum of 4 weeks off - see hbp   ? Adverse effect of dpi > try off spiriva since pfts s sign airflow obst  Active smoking discussed separately

## 2014-01-27 NOTE — Assessment & Plan Note (Signed)
F/u cxr only in 4 weeks, no repeat CT unless refractory symptoms persist off acei

## 2014-01-28 ENCOUNTER — Telehealth: Payer: Self-pay | Admitting: Internal Medicine

## 2014-01-28 NOTE — Telephone Encounter (Signed)
Spoke with pt. Calling to give FYI/update on medications Pt states that she has quit smoking 01/26/14 Pt states that she stopped taking Spiriva since discussed at last OV that he might stop this medication anyway Started Dulera 283mg Using PRN 1-2 x per day - pt advised to use 2 puff BID, patient is not doing this >> seems to be working good. Pt also using Losartan 50-12.5 1 tab QD ( at 7am) and using Amlodipine at 3pm >> pt states this is working great  Will send to Dr WMelvyn Novasas FJuluis Rainier  Pt has upcoming appt with Dr WMelvyn NovasOct 20 @ 10:30

## 2014-02-06 ENCOUNTER — Other Ambulatory Visit: Payer: Self-pay | Admitting: Family Medicine

## 2014-02-07 DIAGNOSIS — Z79899 Other long term (current) drug therapy: Secondary | ICD-10-CM | POA: Diagnosis not present

## 2014-02-07 DIAGNOSIS — G894 Chronic pain syndrome: Secondary | ICD-10-CM | POA: Diagnosis not present

## 2014-02-07 DIAGNOSIS — M545 Low back pain: Secondary | ICD-10-CM | POA: Diagnosis not present

## 2014-02-07 NOTE — Telephone Encounter (Signed)
Okay to refill? 

## 2014-02-07 NOTE — Telephone Encounter (Signed)
Medication called to pharmacy. 

## 2014-02-07 NOTE — Telephone Encounter (Signed)
Ok to refill??  Last office visit 10/25/2013.  Last refill 12/23/2013.

## 2014-02-22 ENCOUNTER — Ambulatory Visit (INDEPENDENT_AMBULATORY_CARE_PROVIDER_SITE_OTHER): Payer: Medicare Other | Admitting: Internal Medicine

## 2014-02-22 ENCOUNTER — Encounter: Payer: Self-pay | Admitting: Internal Medicine

## 2014-02-22 ENCOUNTER — Ambulatory Visit (INDEPENDENT_AMBULATORY_CARE_PROVIDER_SITE_OTHER)
Admission: RE | Admit: 2014-02-22 | Discharge: 2014-02-22 | Disposition: A | Discharge status: Home / Self Care | Payer: Medicare Other | Source: Ambulatory Visit | Attending: Internal Medicine | Admitting: Internal Medicine

## 2014-02-22 VITALS — BP 104/70 | HR 81 | Temp 98.6°F | Wt 107.0 lb

## 2014-02-22 DIAGNOSIS — R9389 Abnormal findings on diagnostic imaging of other specified body structures: Secondary | ICD-10-CM

## 2014-02-22 DIAGNOSIS — R0602 Shortness of breath: Secondary | ICD-10-CM | POA: Diagnosis not present

## 2014-02-22 DIAGNOSIS — I10 Essential (primary) hypertension: Secondary | ICD-10-CM

## 2014-02-22 DIAGNOSIS — R0600 Dyspnea, unspecified: Secondary | ICD-10-CM | POA: Diagnosis not present

## 2014-02-22 DIAGNOSIS — R938 Abnormal findings on diagnostic imaging of other specified body structures: Secondary | ICD-10-CM

## 2014-02-22 DIAGNOSIS — Z23 Encounter for immunization: Secondary | ICD-10-CM | POA: Diagnosis not present

## 2014-02-22 MED ORDER — MOMETASONE FURO-FORMOTEROL FUM 100-5 MCG/ACT IN AERO
INHALATION_SPRAY | RESPIRATORY_TRACT | Status: DC
Start: 2014-02-22 — End: 2014-02-22

## 2014-02-22 MED ORDER — MOMETASONE FURO-FORMOTEROL FUM 100-5 MCG/ACT IN AERO: INHALATION_SPRAY | RESPIRATORY_TRACT | Status: DC

## 2014-02-22 NOTE — Progress Notes (Signed)
Quick Note:  Spoke with pt and notified of results per Dr. Wert. Pt verbalized understanding and denied any questions.  ______ 

## 2014-02-22 NOTE — Assessment & Plan Note (Signed)
cxr is clear, no need for f/u CT unless triggered by new symptoms

## 2014-02-22 NOTE — Progress Notes (Signed)
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    History of Present Illness  01/11/2014 1st [facility] office visit/ Pookela Sellin  Chief Complaint  Patient presents with  . Pulmonary Consult    Referred per Dr. Luan Pulling.  Pt c/o SOB 'all the time" for the past 2-3 months. She also c/o prod cough with green, yellow and brown sputum. Symptoms started after cleaning out a house that had mold.   still actively smoking, pretty sure she stopped acei but not sure when Exp to mold started in march and symptoms worse since then but no better since stopped all mold exp and no eos on cbc or IgE on file in epic Always much better after prednisone, had 3 different abx didn't help spiriva chokes on powder. proair helps some though hfa very poor rec Depromedrol 120 mg today  Dulera 200 Take 2 puffs first thing in am and then another 2 puffs about 12 hours later Only use your albuterol (proair)  Pepcid ac  20 mg at bedtime  Review your medication list when you get home and let us know if there are discrepancy Please remember to go to the  x-ray department downstairs for your tests - we will call you with the results when they are available.   01/25/2014 f/u ov/Giovonni Poirier re: cough / surreptitious use of acei Chief Complaint  Patient presents with  . Follow-up    Pt states her cough seems to be some better. Her breathing is unchanged. She states Dulera caused her to have HA and feel jittery so she stopped med after a few days.   rec Stop quinapril Start losartan 50/12.5 one daily    02/22/2014 f/u ov/Jacorion Klem re: dulera 200 2bid Chief Complaint  Patient presents with  . Follow-up   Pt states that her breathing is about the same. She also states that her cough is no better- still coughing up yellow sputum.  She has not used her rescue inhaler since the last visit.    really Not limited by breathing from desired activities    No obvious day to day or daytime variabilty or assoc   cp or chest tightness, subjective wheeze overt sinus or hb symptoms. No unusual exp hx or h/o childhood pna/ asthma or knowledge of premature birth.  Sleeping ok without nocturnal  or early am exacerbation  of respiratory  c/o's or need for noct saba. Also denies any obvious fluctuation of symptoms with weather or environmental changes or other aggravating or alleviating factors except as outlined above   Current Medications, Allergies, Complete Past Medical History, Past Surgical History, Family History, and Social History were reviewed in [employer] record.  ROS  The following are not active complaints unless bolded sore throat, dysphagia, dental problems, itching, sneezing,  nasal congestion or excess/ purulent secretions, ear ache,   fever, chills, sweats, unintended wt loss, pleuritic or exertional cp, hemoptysis,  orthopnea pnd or leg swelling, presyncope, palpitations, heartburn, abdominal pain, anorexia, nausea, vomiting, diarrhea  or change in bowel or urinary habits, change in stools or urine, dysuria,hematuria,  rash, arthralgias, visual  complaints, headache, numbness weakness or ataxia or problems with walking or coordination,  change in mood/affect or memory.                              Objective:  Physical Exam  amb wf nad   01/25/2014       108 >  02/22/2014  107  Wt Readings from Last 3 Encounters:  01/11/14 109 lb (49.442 kg)  10/25/13 114 lb (51.71 kg)  10/10/13 110 lb (49.896 kg)      HEENT mild turbinate edema.  Oropharynx no thrush or excess pnd or cobblestoning.  No JVD or cervical adenopathy. Mild accessory muscle hypertrophy. Trachea  midline, nl thryroid. Chest was hyperinflated by percussion with diminished breath sounds and moderate increased exp time without wheeze. Hoover sign positive at mid inspiration. Regular rate and rhythm without murmur gallop or rub or increase P2 or edema.  Abd: no hsm, nl excursion. Ext warm without cyanosis or clubbing.     CXR  02/22/2014 :  No active cardiopulmonary disease. No change in hyper aeration.      Assessment:

## 2014-02-22 NOTE — Patient Instructions (Addendum)
Try dulera 100 Take 2 puffs first thing in am and then another 2 puffs about 12 hours later.   Please schedule a follow up visit in 3 months but call sooner if needed

## 2014-02-22 NOTE — Assessment & Plan Note (Signed)
Trial off acei 01/25/14 > improved 02/22/2014 so leave off acei indefinitely

## 2014-02-22 NOTE — Assessment & Plan Note (Addendum)
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 Improved effectiveness after extensive coaching during this visit to a level of approximately  90%     Each maintenance medication was reviewed in detail including most importantly the difference between maintenance and as needed and under what circumstances the prns are to be used.  Please see instructions for details which were reviewed in writing and the patient given a copy.

## 2014-02-25 ENCOUNTER — Ambulatory Visit: Payer: Medicare Other | Admitting: Family Medicine

## 2014-03-06 ENCOUNTER — Encounter (HOSPITAL_COMMUNITY): Payer: Self-pay | Admitting: Emergency Medicine

## 2014-03-06 ENCOUNTER — Emergency Department (HOSPITAL_COMMUNITY): Payer: Medicare Other

## 2014-03-06 ENCOUNTER — Emergency Department (HOSPITAL_COMMUNITY)
Admission: EM | Admit: 2014-03-06 | Discharge: 2014-03-06 | Disposition: A | Discharge status: Home / Self Care | Payer: Medicare Other | Attending: Emergency Medicine | Admitting: Emergency Medicine

## 2014-03-06 DIAGNOSIS — Z87891 Personal history of nicotine dependence: Secondary | ICD-10-CM | POA: Insufficient documentation

## 2014-03-06 DIAGNOSIS — R1013 Epigastric pain: Secondary | ICD-10-CM | POA: Insufficient documentation

## 2014-03-06 DIAGNOSIS — Z79899 Other long term (current) drug therapy: Secondary | ICD-10-CM | POA: Diagnosis not present

## 2014-03-06 DIAGNOSIS — G4730 Sleep apnea, unspecified: Secondary | ICD-10-CM | POA: Insufficient documentation

## 2014-03-06 DIAGNOSIS — J441 Chronic obstructive pulmonary disease with (acute) exacerbation: Secondary | ICD-10-CM | POA: Diagnosis not present

## 2014-03-06 DIAGNOSIS — F17200 Nicotine dependence, unspecified, uncomplicated: Secondary | ICD-10-CM | POA: Diagnosis not present

## 2014-03-06 DIAGNOSIS — R911 Solitary pulmonary nodule: Secondary | ICD-10-CM | POA: Diagnosis not present

## 2014-03-06 DIAGNOSIS — R110 Nausea: Secondary | ICD-10-CM | POA: Insufficient documentation

## 2014-03-06 DIAGNOSIS — R1011 Right upper quadrant pain: Secondary | ICD-10-CM | POA: Diagnosis not present

## 2014-03-06 DIAGNOSIS — R0781 Pleurodynia: Secondary | ICD-10-CM | POA: Diagnosis not present

## 2014-03-06 DIAGNOSIS — I10 Essential (primary) hypertension: Secondary | ICD-10-CM | POA: Diagnosis not present

## 2014-03-06 DIAGNOSIS — R079 Chest pain, unspecified: Secondary | ICD-10-CM | POA: Diagnosis not present

## 2014-03-06 DIAGNOSIS — Z7951 Long term (current) use of inhaled steroids: Secondary | ICD-10-CM | POA: Diagnosis not present

## 2014-03-06 DIAGNOSIS — R0789 Other chest pain: Secondary | ICD-10-CM | POA: Insufficient documentation

## 2014-03-06 DIAGNOSIS — R531 Weakness: Secondary | ICD-10-CM | POA: Insufficient documentation

## 2014-03-06 DIAGNOSIS — G43909 Migraine, unspecified, not intractable, without status migrainosus: Secondary | ICD-10-CM | POA: Diagnosis not present

## 2014-03-06 DIAGNOSIS — R072 Precordial pain: Secondary | ICD-10-CM | POA: Diagnosis not present

## 2014-03-06 DIAGNOSIS — R0602 Shortness of breath: Secondary | ICD-10-CM | POA: Diagnosis not present

## 2014-03-06 DIAGNOSIS — Z8669 Personal history of other diseases of the nervous system and sense organs: Secondary | ICD-10-CM | POA: Diagnosis not present

## 2014-03-06 DIAGNOSIS — R748 Abnormal levels of other serum enzymes: Secondary | ICD-10-CM | POA: Diagnosis not present

## 2014-03-06 DIAGNOSIS — F419 Anxiety disorder, unspecified: Secondary | ICD-10-CM | POA: Insufficient documentation

## 2014-03-06 DIAGNOSIS — K219 Gastro-esophageal reflux disease without esophagitis: Secondary | ICD-10-CM | POA: Diagnosis not present

## 2014-03-06 DIAGNOSIS — R918 Other nonspecific abnormal finding of lung field: Secondary | ICD-10-CM | POA: Diagnosis not present

## 2014-03-06 LAB — BASIC METABOLIC PANEL
ANION GAP: 18 — AB (ref 5–15)
BUN: 6 mg/dL (ref 6–23)
CALCIUM: 9 mg/dL (ref 8.4–10.5)
CHLORIDE: 97 meq/L (ref 96–112)
CO2: 24 mEq/L (ref 19–32)
Creatinine, Ser: 0.38 mg/dL — ABNORMAL LOW (ref 0.50–1.10)
GFR calc Af Amer: >90 mL/min (ref >90)
GFR calc non Af Amer: >90 mL/min (ref >90)
Glucose, Bld: 79 mg/dL (ref 70–99)
POTASSIUM: 3.5 meq/L — AB (ref 3.7–5.3)
Sodium: 139 mEq/L (ref 137–147)

## 2014-03-06 LAB — TROPONIN I: Troponin I: <0.3 ng/mL (ref <0.30)

## 2014-03-06 LAB — I-STAT TROPONIN, ED: Troponin i, poc: 0 ng/mL (ref 0.00–0.08)

## 2014-03-06 LAB — CBC
HCT: 40 % (ref 36.0–46.0)
Hemoglobin: 14.3 g/dL (ref 12.0–15.0)
MCH: 35.1 pg — AB (ref 26.0–34.0)
MCHC: 35.8 g/dL (ref 30.0–36.0)
MCV: 98.3 fL (ref 78.0–100.0)
PLATELETS: 187 10*3/uL (ref 150–400)
RBC: 4.07 MIL/uL (ref 3.87–5.11)
RDW: 14.1 % (ref 11.5–15.5)
WBC: 6.1 10*3/uL (ref 4.0–10.5)

## 2014-03-06 LAB — HEPATIC FUNCTION PANEL
ALT: 198 U/L — ABNORMAL HIGH (ref 0–35)
AST: 222 U/L — ABNORMAL HIGH (ref 0–37)
Albumin: 3.7 g/dL (ref 3.5–5.2)
Alkaline Phosphatase: 182 U/L — ABNORMAL HIGH (ref 39–117)
BILIRUBIN DIRECT: 0.4 mg/dL — AB (ref 0.0–0.3)
BILIRUBIN INDIRECT: 0.7 mg/dL (ref 0.3–0.9)
TOTAL PROTEIN: 6.8 g/dL (ref 6.0–8.3)
Total Bilirubin: 1.1 mg/dL (ref 0.3–1.2)

## 2014-03-06 LAB — D-DIMER, QUANTITATIVE: D-Dimer, Quant: 1.5 ug/mL-FEU — ABNORMAL HIGH (ref 0.00–0.48)

## 2014-03-06 LAB — PRO B NATRIURETIC PEPTIDE: PRO B NATRI PEPTIDE: 34.1 pg/mL (ref 0–125)

## 2014-03-06 LAB — LIPASE, BLOOD: Lipase: 25 U/L (ref 11–59)

## 2014-03-06 MED ORDER — IOHEXOL 350 MG/ML SOLN
100.0000 mL | Freq: Once | INTRAVENOUS | Status: AC | PRN
  Administered 2014-03-06: 100 mL via INTRAVENOUS

## 2014-03-06 MED ORDER — OXYCODONE-ACETAMINOPHEN 5-325 MG PO TABS
2.0000 | ORAL_TABLET | Freq: Once | ORAL | Status: AC
  Administered 2014-03-06: 2 via ORAL
  Filled 2014-03-06: qty 2

## 2014-03-06 MED ORDER — KETOROLAC TROMETHAMINE 30 MG/ML IJ SOLN
30.0000 mg | Freq: Once | INTRAMUSCULAR | Status: AC
  Administered 2014-03-06: 30 mg via INTRAVENOUS
  Filled 2014-03-06: qty 1

## 2014-03-06 NOTE — ED Notes (Signed)
Dr Ray at bedside. 

## 2014-03-06 NOTE — ED Notes (Signed)
Water and snack given.

## 2014-03-06 NOTE — ED Notes (Signed)
Pt has had chest pain off and on for one week. States pain is much worse this morning. Describes as heaviness with nausea and dizziness.

## 2014-03-06 NOTE — Discharge Instructions (Signed)
Chest Pain (Nonspecific) It is often hard to give a specific diagnosis for the cause of chest pain. There is always a chance that your pain could be related to something serious, such as a heart attack or a blood clot in the lungs. You need to follow up with your health care provider for further evaluation. CAUSES   Heartburn.  Pneumonia or bronchitis.  Anxiety or stress.  Inflammation around your heart (pericarditis) or lung (pleuritis or pleurisy).  A blood clot in the lung.  A collapsed lung (pneumothorax). It can develop suddenly on its own (spontaneous pneumothorax) or from trauma to the chest.  Shingles infection (herpes zoster virus). The chest wall is composed of bones, muscles, and cartilage. Any of these can be the source of the pain.  The bones can be bruised by injury.  The muscles or cartilage can be strained by coughing or overwork.  The cartilage can be affected by inflammation and become sore (costochondritis). DIAGNOSIS  Lab tests or other studies may be needed to find the cause of your pain. Your health care provider may have you take a test called an ambulatory electrocardiogram (ECG). An ECG records your heartbeat patterns over a 24-hour period. You may also have other tests, such as:  Transthoracic echocardiogram (TTE). During echocardiography, sound waves are used to evaluate how blood flows through your heart.  Transesophageal echocardiogram (TEE).  Cardiac monitoring. This allows your health care provider to monitor your heart rate and rhythm in real time.  Holter monitor. This is a portable device that records your heartbeat and can help diagnose heart arrhythmias. It allows your health care provider to track your heart activity for several days, if needed.  Stress tests by exercise or by giving medicine that makes the heart beat faster. TREATMENT   Treatment depends on what may be causing your chest pain. Treatment may include:  Acid blockers for  heartburn.  Anti-inflammatory medicine.  Pain medicine for inflammatory conditions.  Antibiotics if an infection is present.  You may be advised to change lifestyle habits. This includes stopping smoking and avoiding alcohol, caffeine, and chocolate.  You may be advised to keep your head raised (elevated) when sleeping. This reduces the chance of acid going backward from your stomach into your esophagus. Most of the time, nonspecific chest pain will improve within 2-3 days with rest and mild pain medicine.  HOME CARE INSTRUCTIONS   If antibiotics were prescribed, take them as directed. Finish them even if you start to feel better.  For the next few days, avoid physical activities that bring on chest pain. Continue physical activities as directed.  Do not use any tobacco products, including cigarettes, chewing tobacco, or electronic cigarettes.  Avoid drinking alcohol.  Only take medicine as directed by your health care provider.  Follow your health care provider's suggestions for further testing if your chest pain does not go away.  Keep any follow-up appointments you made. If you do not go to an appointment, you could develop lasting (chronic) problems with pain. If there is any problem keeping an appointment, call to reschedule. SEEK MEDICAL CARE IF:   Your chest pain does not go away, even after treatment.  You have a rash with blisters on your chest.  You have a fever. SEEK IMMEDIATE MEDICAL CARE IF:   You have increased chest pain or pain that spreads to your arm, neck, jaw, back, or abdomen.  You have shortness of breath.  You have an increasing cough, or you cough  up blood.  You have severe back or abdominal pain.  You feel nauseous or vomit.  You have severe weakness.  You faint.  You have chills. This is an emergency. Do not wait to see if the pain will go away. Get medical help at once. Call your local emergency services (911 in [place] Do not drive  yourself to the hospital. MAKE SURE YOU:   Understand these instructions.  Will watch your condition.  Will get help right away if you are not doing well or get worse. Document Released: 01/30/2005 Document Revised: 04/27/2013 Document Reviewed: 11/26/2007 [facility] Patient Information 2015 [place], [place]. This information is not intended to replace advice given to you by your health care provider. Make sure you discuss any questions you have with your health care provider. Pulmonary Nodule  A pulmonary nodule is a small, round spot in your lung. It is usually found when pictures of your lungs are taken for other reasons. Most pulmonary nodules are not cancerous and do not cause symptoms. Tests will be done to make sure the nodule is not cancerous. Pulmonary nodules that are not cancerous usually do not require treatment. HOME CARE   Only take medicine as told by your doctor.  Follow up with your doctor as told. GET HELP IF:  You have trouble breathing when doing activities.  You feel sick or more tired than normal.  You do not feel like eating.  You lose weight without trying to.  You have chills.  You have night sweats. GET HELP RIGHT AWAY IF:  You cannot catch your breath.  You start making whistling sounds when breathing (wheezing).  You have a cough that does not go away.  You cough up blood.  You are dizzy or feel like you are going to pass out.  You have sudden chest pain.  You have a fever or lasting symptoms for more than 2-3 days.  You have a fever and your symptoms suddenly get worse. MAKE SURE YOU:  Understand these instructions.  Will watch your condition.  Will get help right away if you are not doing well or get worse. Document Released: 05/25/2010 Document Revised: 12/23/2012 Document Reviewed: 10/12/2012 [facility] Patient Information 2015 [place], [place]. This information is not intended to replace advice given to you by your health care  provider. Make sure you discuss any questions you have with your health care provider.

## 2014-03-06 NOTE — ED Provider Notes (Signed)
CSN: [id number]     Arrival date & time 03/06/14  1053 History  This chart was scribed for Shaune Pollack, MD by Edison Simon, ED Scribe. This patient was seen in room APA18/AP[facility]18 and the patient's care was started at 1:40 PM.    Chief Complaint  Patient presents with  . Chest Pain   Patient is a 58 y.o. female presenting with chest pain. The history is provided by the patient. No language interpreter was used.  Chest Pain Pain location:  Substernal area and R chest Pain quality: aching and sharp   Pain radiates to:  Upper back Pain radiates to the back: yes   Pain severity:  Moderate Onset quality:  Gradual Duration:  1 week Timing:  Intermittent Progression:  Worsening Chronicity:  New Relieved by:  Rest Worsened by:  Exertion Ineffective treatments:  None tried Associated symptoms: cough, nausea, shortness of breath and weakness   Associated symptoms: no fever     HPI Comments: ATAVIA POPPE is a 58 y.o. female who presents to the Emergency Department complaining of chest pain with onset 1 week ago, worsening gradually. She states the pain is intermittent, lasting approximately 1 minute. She states pain eases with rest, but states she has not taken any medication for it. She states that when she exercises, she experiences weakness, SOB, and increased chest pain. She states the pain is substernal and radiates to her right lower chest and back. She describes the pain was heavy at first, then sharp with SOB. She also reports an aching pain that is more persistent. She states it was last absent when she went to bed last night. She states her left arm hurts as well but believes it is due a bump she noticed there. She also notes a lump under her jaw since 1.5 weeks ago. She states she has been losing 1 pound a day this past week; she reports decreased appetite and nausea. She also reports chills and a cough that produces yellow sputum. She states she was prescribed antibiotics for cough but  states it has not improved. She reports last seeing her PCP on 10/20. She states she has not seen her PCP for her current complaint. She states she has never before been evaluated by a doctor for chest pain. Records indicate she was evaluated here for chest pain in March but she states the pain was different then. She denies history of blood clots. She reports Hx of Crohn's disease for which she has had surgery. She states she quit smoking 10 days ago, but states she starts and stops intermittently and that her husband has continued smoking. She states she occasionally uses alcohol and last had 1 glass of wine last night. She denies history of COPD or diabetes. She denies fever, leg pain, or leg swelling.    Past Medical History  Diagnosis Date  . Elevated liver enzymes 2009: BMI 24 ?Etoh ALT 94, AST 51,  NEG ANA, qIGs& ASMA    MAY 2012 AST 52 ALT 38  . Anastomotic ulcer JAN 2009  . Esophageal stricture 2009  . Diarrhea MULITIFACTORIAL    IBS, LACTOSE INTOLERANCE, SBBO, BILE-SALT  . Inflammatory bowel disease 2006 CD    SURGICAL REMISSION  . LBP (low back pain)   . Migraine   . Asthma   . Allergic rhinitis   . CTS (carpal tunnel syndrome)   . Hyperlipemia   . Anxiety   . Hemorrhoid   . Crohn disease   . BMI (  body mass index) 20.0-29.9 2009 121 lbs  . 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 Shortness of breath     exertion and humidity  . GERD (gastroesophageal reflux disease)   . Hypertension   . Sleep apnea     Stop Bang Cock score of 4  . PONV (postoperative nausea and vomiting)   . Crohn disease    Past Surgical History  Procedure Laterality Date  . Hemicolectomy  RIGHT 2006  . Hernia repair    . Carpal tunnel release  LEFT  . Lumbar disc surgery    . Vocal cord surgery  Sep 20, 2010    precancerous areas removed  . Esophagogastroduodenoscopy  02/07/09    mild gastritis  . Colonoscopy  JAN 2009 DIARRHEA, ABD PAIN, BRBPR    ANASTOMOTIC ULCER(3MM), IH IN:OMVEHM ULCER, NL COLON  bX  . Upper gastrointestinal endoscopy  JAN 2009 ABD PAIN    GASTRITIS, ESO RING  . Upper gastrointestinal endoscopy  OCT 2010 ABD PAIN, DYSPHAGIA    DIL 15MM, GASTRITIS, NL DUODENUM  . Upper gastrointestinal endoscopy  OCT 2010 DYSPHAGIA    DIL 17 MM, GASTRITIS, NL DUODENUM  . Dilation and curettage of uterus    . Bilateral salpingoophorectomy    . Wisdom tooth extraction      pin placement due to broken jaw  . Colon surgery    . Back surgery    . Esophageal dilation  12/24/2011    SLF: A stricture was found in the distal esophagus/ Moderate gastritis/ MILD Duodenitis  . Esophageal biopsy  12/24/2011    Procedure: BIOPSY;  Surgeon: Danie Binder, MD;  Location: [facility];  Service: Endoscopy;;  Gastric Biopsies  . Esophagogastroduodenoscopy (egd) with propofol N/A 06/30/2012    Procedure: ESOPHAGOGASTRODUODENOSCOPY (EGD) WITH PROPOFOL;  Surgeon: Danie Binder, MD;  Location: [facility];  Service: Endoscopy;  Laterality: N/A;  . Savory dilation N/A 06/30/2012    Procedure: SAVORY DILATION;  Surgeon: Danie Binder, MD;  Location: [facility];  Service: Endoscopy;  Laterality: N/A;  12.63m , 147m 1523m. Esophageal biopsy N/A 06/30/2012    Procedure: BIOPSY;  Surgeon: SanDanie BinderD;  Location: [facility];  Service: Endoscopy;  Laterality: N/A;  Gastric and Esophageal Biopsies   Family History  Problem Relation Age of Onset  . Colon cancer Neg Hx   . Colon polyps Neg Hx   . Hypothyroidism Mother   . Heart disease Father   . Hypothyroidism Daughter    History  Substance Use Topics  . Smoking status: Former Smoker -- 0.50 packs/day for 30 years    Types: Cigarettes    Quit date: 01/27/2014  . Smokeless tobacco: Never Used  . Alcohol Use: No   OB History    No data available     Review of Systems  Constitutional: Positive for chills and appetite change. Negative for fever.  Respiratory: Positive for cough and shortness of breath.   Cardiovascular: Positive for chest pain. Negative for  leg swelling.  Gastrointestinal: Positive for nausea.  Neurological: Positive for weakness.  All other systems reviewed and are negative.     Allergies  Lovastatin and Nexium  Home Medications   Prior to Admission medications   Medication Sig Start Date End Date Taking? Authorizing Provider  albuterol (PROVENTIL HFA;VENTOLIN HFA) 108 (90 BASE) MCG/ACT inhaler Inhale 2 puffs into the lungs every 4 (four) hours as needed for wheezing or shortness of breath.   Yes Historical Provider, MD  amLODipine (NORVASC) 5 MG tablet TAKE ONE TABLET BY MOUTH ONCE DAILY 12/18/13  Yes Alycia Rossetti, MD  calcium carbonate (TUMS - DOSED IN MG ELEMENTAL CALCIUM) 500 MG chewable tablet Chew 1-2 tablets by mouth 2 (two) times daily as needed for indigestion or heartburn.   Yes Historical Provider, MD  cholestyramine Lucrezia Starch) 4 G packet Take 4 g by mouth 2 (two) times daily. Do not take within 2 hours of other medications.   Yes Historical Provider, MD  cyclobenzaprine (FLEXERIL) 5 MG tablet Take 5 mg by mouth 2 (two) times daily as needed for muscle spasms.    Yes Historical Provider, MD  diazepam (VALIUM) 5 MG tablet Take 2.5 mg by mouth 2 (two) times daily as needed for anxiety.   Yes Historical Provider, MD  famotidine (PEPCID) 20 MG tablet Take 20 mg by mouth at bedtime.   Yes Historical Provider, MD  gabapentin (NEURONTIN) 300 MG capsule Take 300 mg by mouth 2 (two) times daily. 10/13/12  Yes Alycia Rossetti, MD  HYDROcodone-acetaminophen [facility]) 10-325 MG per tablet Take 1 tablet by mouth every 6 (six) hours as needed for moderate pain.   Yes Historical Provider, MD  losartan-hydrochlorothiazide (HYZAAR) 50-12.5 MG per tablet Take 1 tablet by mouth daily. 01/25/14  Yes Tanda Rockers, MD  mometasone-formoterol [facility]) 100-5 MCG/ACT AERO Take 2 puffs first thing in am and then another 2 puffs about 12 hours later. 02/22/14  Yes Tanda Rockers, MD  nystatin (MYCOSTATIN) 100000 UNIT/ML suspension Take 5  mLs (500,000 Units total) by mouth 4 (four) times daily. Just as needed 10/25/13  Yes Alycia Rossetti, MD  cholestyramine Lucrezia Starch) 4 GM/DOSE powder TAKE 2 GRAMS BY MOUTH TWO TIMES DAILY WITH A MEAL. Do not take within 2 hours of other medications. 12/27/13   Orvil Feil, NP  diazepam (VALIUM) 5 MG tablet TAKE ONE-HALF TABLET BY MOUTH TWICE DAILY AS NEEDED 02/07/14   Alycia Rossetti, MD  ketoconazole (NIZORAL) 2 % shampoo Apply 1 application topically 2 (two) times a week. For 4 weeks1 10/25/13   Alycia Rossetti, MD  PROAIR HFA 108 (90 BASE) MCG/ACT inhaler INHALE TWO PUFFS INTO LUNGS EVERY 4 HOURS AS NEEDED FOR WHEEZING    Alycia Rossetti, MD  promethazine-codeine [facility] WITH CODEINE) 6.25-10 MG/5ML syrup Take 5 mLs by mouth 4 (four) times daily as needed for cough.    Historical Provider, MD   BP 113/80 mmHg  Pulse 85  Temp(Src) 98 F (36.7 C) (Oral)  Resp 13  Ht 5' (1.524 m)  Wt 98 lb (44.453 kg)  BMI 19.14 kg/m2  SpO2 95% Physical Exam  Constitutional: She is oriented to person, place, and time. She appears well-developed and well-nourished. No distress.  HENT:  Head: Normocephalic and atraumatic.  Right Ear: External ear normal.  Left Ear: External ear normal.  Nose: Nose normal.  Eyes: EOM are normal.  Neck: Normal range of motion. Neck supple.  Pulmonary/Chest: Effort normal and breath sounds normal. No respiratory distress. She has no wheezes. She has no rales. She exhibits tenderness (diffusely tender over left costachodnral junction).  Abdominal: Soft. Bowel sounds are normal. She exhibits no distension. There is tenderness (right epigastrium and RUQ).  Musculoskeletal: Normal range of motion.  Neurological: She is alert and oriented to person, place, and time. She exhibits normal muscle tone. Coordination normal.  Skin: Skin is warm and dry.  Psychiatric: She has a normal mood and affect. Her behavior is normal. Thought content  normal.  Nursing note and vitals  reviewed.   ED Course  Procedures (including critical care time)  DIAGNOSTIC STUDIES: Oxygen Saturation is 95% on room air, adequate by my interpretation.    COORDINATION OF CARE: 1:54 PM Discussed treatment plan with patient at beside, the patient agrees with the plan and has no further questions at this time.   Labs Review Labs Reviewed  CBC - Abnormal; Notable for the following:    MCH 35.1 (*)    All other components within normal limits  BASIC METABOLIC PANEL - Abnormal; Notable for the following:    Potassium 3.5 (*)    Creatinine, Ser 0.38 (*)    Anion gap 18 (*)    All other components within normal limits  HEPATIC FUNCTION PANEL - Abnormal; Notable for the following:    AST 222 (*)    ALT 198 (*)    Alkaline Phosphatase 182 (*)    Bilirubin, Direct 0.4 (*)    All other components within normal limits  D-DIMER, QUANTITATIVE - Abnormal; Notable for the following:    D-Dimer, Quant 1.50 (*)    All other components within normal limits  PRO B NATRIURETIC PEPTIDE  TROPONIN I  LIPASE, BLOOD  I-STAT TROPOININ, ED    Imaging Review Ct Angio Chest Pe W/cm &/or Wo Cm  03/06/2014   CLINICAL DATA:  Chest pain and shortness of breath  EXAM: CT ANGIOGRAPHY CHEST WITH CONTRAST  TECHNIQUE: Multidetector CT imaging of the chest was performed using the standard protocol during bolus administration of intravenous contrast. Multiplanar CT image reconstructions and MIPs were obtained to evaluate the vascular anatomy.  CONTRAST:  182m OMNIPAQUE IOHEXOL 350 MG/ML SOLN  COMPARISON:  Chest CT November 10, 2013 and chest radiograph February 22, 2014  FINDINGS: There is no demonstrable pulmonary embolus. There is no thoracic aortic aneurysm or dissection.  On axial slice 29 series 5, there are two, 2 mm nodular opacities in the posterior segment of the right upper lobe. On axial slice 35 series 5, there is a tiny calcified granuloma in the posterior segment of the right lower lobe. On this same  slice, there is a 3 mm nodular opacity in the superior segment of the right lower lobe. On axial slice 37 series 5, there is a 3 mm nodular opacity in the posterior segment of the headache left upper lobe. On axial slice 42 series 5, there is a 4 mm nodular opacity in the periphery of the superior segment right lower lobe. There is no lung edema or consolidation.  There is no appreciable thoracic adenopathy. The pericardium is not thickened.  In the visualized upper abdomen, there is hepatic steatosis. There is mild hypertrophy of both adrenal glands, incompletely visualized.  There are no blastic or lytic bone lesions. Visualized thyroid appears normal.  Review of the MIP images confirms the above findings.  IMPRESSION: No demonstrable pulmonary embolus. No edema or consolidation. Several small nodular opacities are noted in the lungs bilaterally. Followup of the small nodular opacity should be based on Fleischner Society guidelines. If the patient is at high risk for bronchogenic carcinoma, follow-up chest CT at 1year is recommended. If the patient is at low risk, no follow-up is needed. This recommendation follows the consensus statement: Guidelines for Management of Small Pulmonary Nodules Detected on CT Scans: A Statement from the F[employer]as published in Radiology 2005; 237:395-400.  No appreciable adenopathy. Hepatic steatosis. Incomplete visualization of adrenals which show mild symmetric hypertrophy bilaterally.  Electronically Signed   By: Lowella Grip M.D.   On: 03/06/2014 16:27     EKG Interpretation   Date/Time:  Sunday March 06 2014 11:01:50 EST Ventricular Rate:  105 PR Interval:  182 QRS Duration: 103 QT Interval:  349 QTC Calculation: 461 R Axis:   4 Text Interpretation:  Sinus tachycardia Incomplete right bundle branch  block SINCE LAST TRACING HEART RATE HAS INCREASED Confirmed by Orra Nolde MD,  Andee Poles ([id number]) on 03/06/2014 2:25:30 PM      MDM   Final diagnoses:   Pleuritic chest pain  Chest wall pain  Pulmonary nodules/lesions, multiple  Smoker   58 y.o. Female with anterior chest pain with chest wall pain but potentially some pleuritic component.  Patient with slightly elevate d-dimer but no evidence of pe on ct angio.  Patient with nodules seen and discussed with patient and advised re need for follow up.  Low clinical suspicion for acs and no acute changes on ekg and serial troponins negative with pain consistent for 12 hours.   I personally performed the services described in this documentation, which was scribed in my presence. The recorded information has been reviewed and considered.   Shaune Pollack, MD 03/06/14 [phone]

## 2014-03-09 ENCOUNTER — Telehealth: Payer: Self-pay | Admitting: Internal Medicine

## 2014-03-09 NOTE — Telephone Encounter (Signed)
Pt scheduled to see MW 05/25/14. Pt had CTa, labs and EKG done at [facility] 03/06/14 Pt wants MW to look at these and does she need to come in sooner/ Please advise thanks

## 2014-03-09 NOTE — Telephone Encounter (Signed)
Pt returning call, please call and explain.[name] [name]

## 2014-03-09 NOTE — Telephone Encounter (Signed)
lmomtcb x1 

## 2014-03-09 NOTE — Telephone Encounter (Signed)
Called made pt aware of below. Nothing further needed

## 2014-03-09 NOTE — Telephone Encounter (Signed)
No need for early f/u as really no acute changes on CT

## 2014-03-14 ENCOUNTER — Ambulatory Visit: Payer: Medicare Other | Admitting: Family Medicine

## 2014-03-14 ENCOUNTER — Other Ambulatory Visit: Payer: Self-pay | Admitting: Family Medicine

## 2014-03-15 NOTE — Telephone Encounter (Signed)
Medication called to pharmacy. 

## 2014-03-15 NOTE — Telephone Encounter (Signed)
Give 30 day supply only, needs an OV before any further refills

## 2014-03-15 NOTE — Telephone Encounter (Signed)
Ok to refill??  Last office visit 10/25/2013.  Last refill 02/07/2014.

## 2014-03-17 ENCOUNTER — Other Ambulatory Visit: Payer: Self-pay | Admitting: Family Medicine

## 2014-03-17 NOTE — Telephone Encounter (Signed)
Refill appropriate and filled per protocol. 

## 2014-04-04 ENCOUNTER — Ambulatory Visit (INDEPENDENT_AMBULATORY_CARE_PROVIDER_SITE_OTHER): Payer: Medicare Other | Admitting: Gastroenterology

## 2014-04-04 ENCOUNTER — Encounter: Payer: Self-pay | Admitting: Gastroenterology

## 2014-04-04 VITALS — BP 129/85 | HR 93 | Temp 97.0°F | Ht 59.0 in | Wt 104.2 lb

## 2014-04-04 DIAGNOSIS — M542 Cervicalgia: Secondary | ICD-10-CM | POA: Diagnosis not present

## 2014-04-04 DIAGNOSIS — K50912 Crohn's disease, unspecified, with intestinal obstruction: Secondary | ICD-10-CM

## 2014-04-04 DIAGNOSIS — R740 Nonspecific elevation of levels of transaminase and lactic acid dehydrogenase [LDH]: Secondary | ICD-10-CM

## 2014-04-04 DIAGNOSIS — Z79899 Other long term (current) drug therapy: Secondary | ICD-10-CM | POA: Diagnosis not present

## 2014-04-04 DIAGNOSIS — M545 Low back pain: Secondary | ICD-10-CM | POA: Diagnosis not present

## 2014-04-04 DIAGNOSIS — R7402 Elevation of levels of lactic acid dehydrogenase (LDH): Secondary | ICD-10-CM

## 2014-04-04 NOTE — Assessment & Plan Note (Signed)
SURGICAL REMISSION.  CONTINUE TO MONITOR SYMPTOMS. FOLLOW UP IN 6 MOS. MERRY CHRISTMAS AND HAPPY NEW YEAR!

## 2014-04-04 NOTE — Progress Notes (Signed)
ON RECALL LIST  °

## 2014-04-04 NOTE — Patient Instructions (Signed)
ULTRASOUND DEC 2015.  LIVER ENZYMES IN FEB 2016.  FOLLOW UP IN 6 MOS. MERRY CHRISTMAS AND HAPPY NEW YEAR!

## 2014-04-04 NOTE — Progress Notes (Signed)
Subjective:    Patient ID: [name] [name], female    DOB: [birth year], 58 y.o.   MRN: [id number]  [name], MD  HPI HAD TROUBLE WITH PNA x2 IN 2015. LIVER ENZYMES WORSE. NO IMAGING. LOST MAR 2015: 122 LBS AND NOW 104 LBS. HEARTBURN: CONTROLLED. LAST BPE 2014: ESOPHAGITIS, ESO MOTILITY DISORDER. PARTNER LOVES DEEP FRIED FOOD. EATS DEER MEAT. BOWLES HAVE GOOD AND BAD DAYS: DIARRHEA/CONSTIPATION/NL(#1-#7). Rare brbpr due to hemorrhoids. VOMITED AFTER COUGHING. OCCASIONAL problems swallowing.   Past Medical History  Diagnosis Date  . Elevated liver enzymes 2009: BMI 24 ?Etoh ALT 94, AST 51,  NEG ANA, qIGs& ASMA    MAY 2012 AST 52 ALT 38  . Anastomotic ulcer JAN 2009  . Esophageal stricture 2009  . Diarrhea MULITIFACTORIAL    IBS, LACTOSE INTOLERANCE, SBBO, BILE-SALT  . Inflammatory bowel disease 2006 CD    SURGICAL REMISSION  . LBP (low back pain)   . Migraine   . Asthma   . Allergic rhinitis   . CTS (carpal tunnel syndrome)   . Hyperlipemia   . Anxiety   . Hemorrhoid   . Crohn disease   . BMI (body mass index) 20.0-29.9 2009 121 lbs  . 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 Shortness of breath     exertion and humidity  . GERD (gastroesophageal reflux disease)   . Hypertension   . Sleep apnea     Stop Bang Cock score of 4  . PONV (postoperative nausea and vomiting)   . Crohn disease    Past Surgical History  Procedure Laterality Date  . Hemicolectomy  RIGHT 2006  . Hernia repair    . Carpal tunnel release  LEFT  . Lumbar disc surgery    . Vocal cord surgery  Sep 20, 2010    precancerous areas removed  . Esophagogastroduodenoscopy  02/07/09    mild gastritis  . Colonoscopy  JAN 2009 DIARRHEA, ABD PAIN, BRBPR    ANASTOMOTIC ULCER(3MM), IH BZ:JIRCVE ULCER, NL COLON bX  . Upper gastrointestinal endoscopy  JAN 2009 ABD PAIN    GASTRITIS, ESO RING  . Upper gastrointestinal endoscopy  OCT 2010 ABD PAIN, DYSPHAGIA    DIL 15MM, GASTRITIS, NL DUODENUM  . Upper  gastrointestinal endoscopy  OCT 2010 DYSPHAGIA    DIL 17 MM, GASTRITIS, NL DUODENUM  . Dilation and curettage of uterus    . Bilateral salpingoophorectomy    . Wisdom tooth extraction      pin placement due to broken jaw  . Colon surgery    . Back surgery    . Esophageal dilation  12/24/2011    SLF: A stricture was found in the distal esophagus/ Moderate gastritis/ MILD Duodenitis  . Esophageal biopsy  12/24/2011    Procedure: BIOPSY;  Surgeon: Danie Binder, MD;  Location: [facility];  Service: Endoscopy;;  Gastric Biopsies  . Esophagogastroduodenoscopy (egd) with propofol N/A 06/30/2012    Procedure: ESOPHAGOGASTRODUODENOSCOPY (EGD) WITH PROPOFOL;  Surgeon: Danie Binder, MD;  Location: [facility];  Service: Endoscopy;  Laterality: N/A;  . Savory dilation N/A 06/30/2012    Procedure: SAVORY DILATION;  Surgeon: Danie Binder, MD;  Location: [facility];  Service: Endoscopy;  Laterality: N/A;  12.20m , 142m 1544m. Esophageal biopsy N/A 06/30/2012    Procedure: BIOPSY;  Surgeon: SanDanie BinderD;  Location: [facility];  Service: Endoscopy;  Laterality: N/A;  Gastric and Esophageal Biopsies    Allergies  Allergen Reactions  . Lovastatin  Other (See Comments)    Chest pain  . Nexium [Esomeprazole Magnesium] Diarrhea    Abdominal pain    Current Outpatient Prescriptions  Medication Sig Dispense Refill  . albuterol (PROVENTIL HFA;VENTOLIN HFA) 108 (90 BASE) MCG/ACT inhaler Inhale 2 puffs into the lungs every 4 (four) hours as needed for wheezing or shortness of breath.    Marland Kitchen amLODipine (NORVASC) 5 MG tablet TAKE ONE TABLET BY MOUTH ONCE DAILY    . calcium carbonate (TUMS - DOSED IN MG ELEMENTAL CALCIUM) 500 MG chewable tablet Chew 1-2 tablets by mouth 2 (two) times daily as needed for indigestion or heartburn.    .      . cholestyramine (QUESTRAN) 4 GM/DOSE powder TAKE 2 GRAMS BY MOUTH TWO TIMES DAILY WITH A MEAL. Do not take within 2 hours of other medications.    . cyclobenzaprine (FLEXERIL) 5 MG  tablet Take 5 mg by mouth 2 (two) times daily as needed for muscle spasms.     . diazepam (VALIUM) 5 MG tablet TAKE ONE-HALF TABLET BY MOUTH TWICE DAILY AS NEEDED    . famotidine (PEPCID) 20 MG tablet Take 20 mg by mouth at bedtime.    . gabapentin (NEURONTIN) 300 MG capsule Take 300 mg by mouth 2 (two) times daily.    Marland Kitchen HYDROcodone-acetaminophen (NORCO) 10-325 MG per tablet Take 1 tablet by mouth every 6 (six) hours as needed for moderate pain.    Marland Kitchen ketoconazole (NIZORAL) 2 % shampoo Apply 1 application topically 2 (two) times a week. For 4 weeks1    . losartan-hydrochlorothiazide (HYZAAR) 50-12.5 MG per tablet Take 1 tablet by mouth daily.    . mometasone-formoterol (DULERA) 100-5 MCG/ACT AERO Take 2 puffs first thing in am and then another 2 puffs about 12 hours later.    . nystatin (MYCOSTATIN) 100000 UNIT/ML suspension Take 5 mLs (500,000 Units total) by mouth 4 (four) times daily. Just as needed    . PROAIR HFA 108 (90 BASE) MCG/ACT inhaler INHALE TWO PUFFS INTO LUNGS EVERY 4 HOURS AS NEEDED FOR WHEEZING    . promethazine-codeine (PHENERGAN WITH CODEINE) 6.25-10 MG/5ML syrup Take 5 mLs by mouth 4 (four) times daily as needed for cough.        Review of Systems     Objective:   Physical Exam  Constitutional: She is oriented to person, place, and time. She appears well-developed and well-nourished. No distress.  HENT:  Head: Normocephalic and atraumatic.  Mouth/Throat: Oropharynx is clear and moist. No oropharyngeal exudate.  Eyes: Pupils are equal, round, and reactive to light. No scleral icterus.  Neck: Normal range of motion. Neck supple.  Cardiovascular: Normal rate, regular rhythm and normal heart sounds.   Pulmonary/Chest: Effort normal. No respiratory distress.  COARSE BS BILATERALLY WITH END EXPIRATORY WHEEZES.  Abdominal: Soft. Bowel sounds are normal. She exhibits no distension. There is tenderness. There is no rebound and no guarding.  MILD RUQ TTP   Musculoskeletal:  She exhibits no edema.  Lymphadenopathy:    She has no cervical adenopathy.  Neurological: She is alert and oriented to person, place, and time.  Psychiatric: She has a normal mood and affect.  Vitals reviewed.         Assessment & Plan:

## 2014-04-04 NOTE — Assessment & Plan Note (Signed)
NL HFP DEC 2014-ACUTE INCREASE DURING ACUTE ILLNESS-NO IMAGING OBTAINED. ETIOLOGY UNCLEAR. PT DENIES ETOH OR TOBACCO USE.  RUQ U/S DEC 2015 REPEAT HFP FEB 2015 FOLLOW UP IN 6 MOS. MERRY CHRISTMAS AND HAPPY NEW YEAR!

## 2014-04-05 ENCOUNTER — Ambulatory Visit (INDEPENDENT_AMBULATORY_CARE_PROVIDER_SITE_OTHER): Payer: Medicare Other | Admitting: Family Medicine

## 2014-04-05 ENCOUNTER — Encounter: Payer: Self-pay | Admitting: Family Medicine

## 2014-04-05 VITALS — BP 128/72 | HR 96 | Temp 98.6°F | Resp 20 | Ht 60.0 in | Wt 101.0 lb

## 2014-04-05 DIAGNOSIS — B370 Candidal stomatitis: Secondary | ICD-10-CM

## 2014-04-05 DIAGNOSIS — G8929 Other chronic pain: Secondary | ICD-10-CM | POA: Diagnosis not present

## 2014-04-05 DIAGNOSIS — I10 Essential (primary) hypertension: Secondary | ICD-10-CM

## 2014-04-05 DIAGNOSIS — M67432 Ganglion, left wrist: Secondary | ICD-10-CM | POA: Diagnosis not present

## 2014-04-05 DIAGNOSIS — M67439 Ganglion, unspecified wrist: Secondary | ICD-10-CM | POA: Insufficient documentation

## 2014-04-05 DIAGNOSIS — L0100 Impetigo, unspecified: Secondary | ICD-10-CM

## 2014-04-05 MED ORDER — NYSTATIN 100000 UNIT/ML MT SUSP: 500000.0000 [IU] | Freq: Four times a day (QID) | OROMUCOSAL | Status: DC

## 2014-04-05 MED ORDER — MUPIROCIN 2 % EX OINT: 1.0000 "application " | TOPICAL_OINTMENT | Freq: Two times a day (BID) | CUTANEOUS | Status: DC

## 2014-04-05 NOTE — Progress Notes (Signed)
cc'ed to pcp °

## 2014-04-05 NOTE — Assessment & Plan Note (Signed)
Switched to ARB by pulmonary, BP well controlled

## 2014-04-05 NOTE — Assessment & Plan Note (Signed)
Needs referral for removal by Dr. Amedeo Plenty

## 2014-04-05 NOTE — Assessment & Plan Note (Signed)
Continue with pain clinic

## 2014-04-05 NOTE — Patient Instructions (Signed)
Cholesterol check in Feb Referral for Dr. Amedeo Plenty  Topical antibotic cream twice a day  Continue all other medications F/U 4 months

## 2014-04-05 NOTE — Assessment & Plan Note (Signed)
Some lesions look more like a staph/strep infection but she has picked at them Given bactroban topical If this does not clear up send to dermatology, has been treated with multiple agents, topical, oral

## 2014-04-05 NOTE — Progress Notes (Signed)
Patient ID: [name] [name], female   DOB: [birth year], 58 y.o.   MRN: [id number]   Subjective:    Patient ID: [name] [name], female    DOB: 1955/10/11, 58 y.o.   MRN: [id number]  Patient presents for 4 month F/U and [facility] visit and fungal disease  patient here to follow chronic medical problems. She continues to get a rash that pops out as small little bumps that look like small whiteheads she then masses with them and scratches them until they become red. I did change her with acute, as all cream in her scalp I believe she was treated for scabies as well the past but this did not clear up the rash.  She continues to get thrush especially when she is on prednisone sometimes after the use of her inhalers. We discussed rinsing her mouth. She is out of her nystatin swish and swallow.  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 Advised to review this with lung doctor, last CT scan showed no new lesions  She is also in pain clinic  Review Of Systems:  GEN- denies fatigue, fever, weight loss,weakness, recent illness HEENT- denies eye drainage, change in vision, nasal discharge, CVS- denies chest pain, palpitations RESP- denies SOB, cough, +wheeze ABD- denies N/V, change in stools, abd pain GU- denies dysuria, hematuria, dribbling, incontinence MSK- + joint pain, muscle aches, injury Neuro- denies headache, dizziness, syncope, seizure activity       Objective:    BP 128/72 mmHg  Pulse 96  Temp(Src) 98.6 F (37 C) (Oral)  Resp 20  Ht 5' (1.524 m)  Wt 101 lb (45.813 kg)  BMI 19.73 kg/m2  SpO2 96% GEN- NAD, alert and oriented x3 HEENT- PERRL, EOMI, non injected sclera, pink conjunctiva, MMM, oropharynx clear, mild thrush Neck- Supple, no LAD CVS- RRR, no murmur RESP-scattered wheeze, normal WOB, no rales ABD-NABS,soft,NT,ND Skin- scattered excoriated  erythematous maculopapular lesions on abdomen 2 lesions on right leg, healed scars from previous lesions EXT- No edema Pulses- Radial 2+        Assessment & Plan:      Problem List Items Addressed This Visit    None      Note: This dictation was prepared with Dragon dictation along with smaller phrase technology. Any transcriptional errors that result from this process are unintentional.

## 2014-04-05 NOTE — Assessment & Plan Note (Signed)
Avoid prednisone as much as possible, nystatin refilled

## 2014-04-12 ENCOUNTER — Telehealth: Payer: Self-pay

## 2014-04-12 ENCOUNTER — Other Ambulatory Visit: Payer: Self-pay

## 2014-04-12 DIAGNOSIS — R748 Abnormal levels of other serum enzymes: Secondary | ICD-10-CM

## 2014-04-12 NOTE — Telephone Encounter (Signed)
Called and informed patient about her [place]. Pt is aware of appt for U/S on 04/14/2014 @ 945 am.

## 2014-04-14 ENCOUNTER — Ambulatory Visit (HOSPITAL_COMMUNITY)
Admission: RE | Admit: 2014-04-14 | Discharge: 2014-04-14 | Disposition: A | Discharge status: Home / Self Care | Payer: Medicare Other | Source: Ambulatory Visit | Attending: Gastroenterology | Admitting: Gastroenterology

## 2014-04-14 DIAGNOSIS — K760 Fatty (change of) liver, not elsewhere classified: Secondary | ICD-10-CM | POA: Diagnosis not present

## 2014-04-14 DIAGNOSIS — R748 Abnormal levels of other serum enzymes: Secondary | ICD-10-CM | POA: Diagnosis present

## 2014-04-20 ENCOUNTER — Other Ambulatory Visit: Payer: Self-pay | Admitting: Family Medicine

## 2014-04-20 NOTE — Telephone Encounter (Signed)
Ok to refill??  Last office visit 04/05/2014.  Last refill 03/25/2014.

## 2014-04-20 NOTE — Telephone Encounter (Signed)
Okay to refill give 2

## 2014-04-20 NOTE — Telephone Encounter (Signed)
Medication called to pharmacy. 

## 2014-04-21 ENCOUNTER — Telehealth: Payer: Self-pay | Admitting: Family Medicine

## 2014-04-21 NOTE — Telephone Encounter (Signed)
Error duplicate request

## 2014-04-25 ENCOUNTER — Ambulatory Visit (INDEPENDENT_AMBULATORY_CARE_PROVIDER_SITE_OTHER): Payer: Medicare Other | Admitting: Physician Assistant

## 2014-04-25 ENCOUNTER — Encounter: Payer: Self-pay | Admitting: Physician Assistant

## 2014-04-25 VITALS — BP 118/76 | HR 80 | Temp 98.0°F | Resp 18 | Wt 100.0 lb

## 2014-04-25 DIAGNOSIS — H109 Unspecified conjunctivitis: Secondary | ICD-10-CM | POA: Diagnosis not present

## 2014-04-25 DIAGNOSIS — J209 Acute bronchitis, unspecified: Secondary | ICD-10-CM | POA: Diagnosis not present

## 2014-04-25 DIAGNOSIS — J988 Other specified respiratory disorders: Secondary | ICD-10-CM

## 2014-04-25 DIAGNOSIS — B9689 Other specified bacterial agents as the cause of diseases classified elsewhere: Secondary | ICD-10-CM

## 2014-04-25 MED ORDER — SULFACETAMIDE SODIUM 10 % OP SOLN: 1.0000 [drp] | OPHTHALMIC | Status: DC

## 2014-04-25 MED ORDER — AZITHROMYCIN 250 MG PO TABS: ORAL_TABLET | ORAL | Status: DC

## 2014-04-25 MED ORDER — PREDNISONE 20 MG PO TABS: ORAL_TABLET | ORAL | Status: DC

## 2014-04-25 NOTE — Progress Notes (Signed)
Patient ID: [name] [name] MRN: [id number], DOB: [birth year], 58 y.o. Date of Encounter: 04/25/2014, 11:53 AM    Chief Complaint:  Chief Complaint  Patient presents with  . sick x 1 week    poss pink eye, sinus congestion     HPI: 58 y.o. year old white female presents with above symptoms.   Says she has a lot of cough. Productive of chunky phlegm.  Says she sees Dr. Melvyn Novas. Has "spot on lung secondary to mold".  Only uses Albuterol and Dulera once a day b/c "makes my heart race."  Left eye got red, crusty in mornings. Now right eye doing same thing.   Quit smoking 3 years ago. No fever, chills.      Home Meds:   Outpatient Prescriptions Prior to Visit  Medication Sig Dispense Refill  . albuterol (PROVENTIL HFA;VENTOLIN HFA) 108 (90 BASE) MCG/ACT inhaler Inhale 2 puffs into the lungs every 4 (four) hours as needed for wheezing or shortness of breath.    Marland Kitchen amLODipine (NORVASC) 5 MG tablet TAKE ONE TABLET BY MOUTH ONCE DAILY 90 tablet 0  . calcium carbonate (TUMS - DOSED IN MG ELEMENTAL CALCIUM) 500 MG chewable tablet Chew 1-2 tablets by mouth 2 (two) times daily as needed for indigestion or heartburn.    . cholestyramine (QUESTRAN) 4 GM/DOSE powder TAKE 2 GRAMS BY MOUTH TWO TIMES DAILY WITH A MEAL. Do not take within 2 hours of other medications. 378 g 5  . cyclobenzaprine (FLEXERIL) 5 MG tablet Take 5 mg by mouth 2 (two) times daily as needed for muscle spasms.     . diazepam (VALIUM) 5 MG tablet Take 1 tablet (5 mg total) by mouth every 12 (twelve) hours as needed for anxiety. 30 tablet 2  . famotidine (PEPCID) 20 MG tablet Take 20 mg by mouth at bedtime.    . gabapentin (NEURONTIN) 300 MG capsule Take 300 mg by mouth 2 (two) times daily.    Marland Kitchen HYDROcodone-acetaminophen (NORCO) 10-325 MG per tablet Take 1 tablet by mouth every 6 (six) hours as needed for moderate pain.    Marland Kitchen ketoconazole (NIZORAL) 2 % shampoo Apply 1 application topically 2 (two) times a week. For 4 weeks1 120  mL 1  . losartan-hydrochlorothiazide (HYZAAR) 50-12.5 MG per tablet Take 1 tablet by mouth daily. 30 tablet 11  . mometasone-formoterol (DULERA) 100-5 MCG/ACT AERO Take 2 puffs first thing in am and then another 2 puffs about 12 hours later. 1 Inhaler 0  . mupirocin ointment (BACTROBAN) 2 % Apply 1 application topically 2 (two) times daily. 30 g 3  . nystatin (MYCOSTATIN) 100000 UNIT/ML suspension Take 5 mLs (500,000 Units total) by mouth 4 (four) times daily. Just as needed 180 mL 3  . PROAIR HFA 108 (90 BASE) MCG/ACT inhaler INHALE TWO PUFFS INTO LUNGS EVERY 4 HOURS AS NEEDED FOR WHEEZING 1 Inhaler 11  . promethazine-codeine (PHENERGAN WITH CODEINE) 6.25-10 MG/5ML syrup Take 5 mLs by mouth 4 (four) times daily as needed for cough.    . cholestyramine (QUESTRAN) 4 G packet Take 4 g by mouth 2 (two) times daily. Do not take within 2 hours of other medications.     No facility-administered medications prior to visit.    Allergies:  Allergies  Allergen Reactions  . Lovastatin Other (See Comments)    Chest pain  . Nexium [Esomeprazole Magnesium] Diarrhea    Abdominal pain      Review of Systems: See HPI for pertinent ROS. All other ROS  negative.    Physical Exam: Blood pressure 118/76, pulse 80, temperature 98 F (36.7 C), temperature source Oral, resp. rate 18, weight 100 lb (45.36 kg)., Body mass index is 19.53 kg/(m^2). GeneralDeitra Mayo WF. Appears in no acute distress. HEENT: Normocephalic, atraumatic. Bilateral Eyes with mildly pink injected conjunctiva.  nares are without discharge. Bilateral auditory canals clear, TM's are without perforation, pearly grey and translucent with reflective cone of light bilaterally. Oral cavity moist, posterior pharynx without exudate, erythema, peritonsillar abscess.  Neck: Supple. No thyromegaly. No lymphadenopathy. Lungs: Wheezes/Rhonchi bilaterally but good air movement.  Heart: Regular rhythm. No murmurs, rubs, or gallops. Msk:  Strength  and tone normal for age. Extremities/Skin: Warm and dry.  Neuro: Alert and oriented X 3. Moves all extremities spontaneously. Gait is normal. CNII-XII grossly in tact. Psych:  Responds to questions appropriately with a normal affect.     ASSESSMENT AND PLAN:  58 y.o. year old female with  1. Bacterial respiratory infection - azithromycin (ZITHROMAX) 250 MG tablet; Day 1: take 2 daily. Days 2-5: take 1 daily.  Dispense: 6 tablet; Refill: 0 - predniSONE (DELTASONE) 20 MG tablet; Take 2 daily for 5 days  Dispense: 10 tablet; Refill: 0  2. Acute bronchitis, unspecified organism - azithromycin (ZITHROMAX) 250 MG tablet; Day 1: take 2 daily. Days 2-5: take 1 daily.  Dispense: 6 tablet; Refill: 0 - predniSONE (DELTASONE) 20 MG tablet; Take 2 daily for 5 days  Dispense: 10 tablet; Refill: 0  3. Bilateral conjunctivitis - sulfacetamide (BLEPH-10) 10 % ophthalmic solution; Place 1 drop into both eyes every 3 (three) hours.  Dispense: 15 mL; Refill: 0  Take meds as directed above.  F/U if symptoms worsen or do not resolve with completion of meds.   Signed, [address] [place], [place], [facility] 04/25/2014 11:53 AM

## 2014-05-09 ENCOUNTER — Telehealth: Payer: Self-pay | Admitting: Gastroenterology

## 2014-05-09 ENCOUNTER — Other Ambulatory Visit: Payer: Self-pay

## 2014-05-09 DIAGNOSIS — R945 Abnormal results of liver function studies: Principal | ICD-10-CM

## 2014-05-09 DIAGNOSIS — R7989 Other specified abnormal findings of blood chemistry: Secondary | ICD-10-CM

## 2014-05-09 NOTE — Telephone Encounter (Signed)
PLEASE CALL PT. HER U/S SHOWS FATTY LIVER DISEASE. SHE NEEDS A CMP THIS WEEK AND A MRCP NEXT WEEK IF HER LIVER ENZYMES ARE ELEVATED.

## 2014-05-09 NOTE — Telephone Encounter (Signed)
Pt is aware and lab orders have been faxed to [facility]. She will try to go as soon as she can, she has pink eye at this time and is on antibiotics for that.

## 2014-05-11 DIAGNOSIS — M6740 Ganglion, unspecified site: Secondary | ICD-10-CM | POA: Diagnosis not present

## 2014-05-11 DIAGNOSIS — M79642 Pain in left hand: Secondary | ICD-10-CM | POA: Diagnosis not present

## 2014-05-25 ENCOUNTER — Ambulatory Visit (INDEPENDENT_AMBULATORY_CARE_PROVIDER_SITE_OTHER): Payer: Medicare Other | Admitting: Internal Medicine

## 2014-05-25 ENCOUNTER — Encounter: Payer: Self-pay | Admitting: Internal Medicine

## 2014-05-25 ENCOUNTER — Ambulatory Visit: Payer: Medicare Other | Admitting: Internal Medicine

## 2014-05-25 VITALS — BP 118/70 | HR 88 | Ht 60.0 in | Wt 104.8 lb

## 2014-05-25 DIAGNOSIS — R938 Abnormal findings on diagnostic imaging of other specified body structures: Secondary | ICD-10-CM

## 2014-05-25 DIAGNOSIS — R9389 Abnormal findings on diagnostic imaging of other specified body structures: Secondary | ICD-10-CM

## 2014-05-25 DIAGNOSIS — R0600 Dyspnea, unspecified: Secondary | ICD-10-CM | POA: Diagnosis not present

## 2014-05-25 MED ORDER — LOSARTAN POTASSIUM-HCTZ 50-12.5 MG PO TABS: 1.0000 | ORAL_TABLET | Freq: Every day | ORAL | Status: DC

## 2014-05-25 NOTE — Patient Instructions (Signed)
Continue dulera 100 Take 2 puffs first thing in am and then another 2 puffs about 12 hours later.   you do not have significant copd and you never will unless you resume smoking   If you are satisfied with your treatment plan,  let your doctor know and she can either refill your medications or you can return here when your prescription runs out.     If in any way you are not 100% satisfied,  please tell us.  If 100% better, tell your friends!  Pulmonary follow up is as needed

## 2014-05-25 NOTE — Progress Notes (Signed)
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 Pulmonary Consult    Referred per Dr. Luan Pulling.  Pt c/o SOB 'all the time" for the past 2-3 months. She also c/o prod cough with green, yellow and brown sputum. Symptoms started after cleaning out a house that had mold.   still actively smoking, pretty sure she stopped acei but not sure when Exp to mold started in march and symptoms worse since then but no better since stopped all mold exp and no eos on cbc or IgE on file in epic Always much better after prednisone, had 3 different abx didn't help spiriva chokes on powder. proair helps some though hfa very poor rec Depromedrol 120 mg today  Dulera 200 Take 2 puffs first thing in am and then another 2 puffs about 12 hours later Only use your albuterol (proair)  Pepcid ac  20 mg at bedtime  Review your medication list when you get home and let us know if there are discrepancy Please remember to go to the  x-ray department downstairs for your tests - we will call you with the results when they are available.   01/25/2014 f/u ov/Tage Feggins re: cough / surreptitious use of acei Chief Complaint  Patient presents with  . Follow-up    Pt states her cough seems to be some better. Her breathing is unchanged. She states Dulera caused her to have HA and feel jittery so she stopped med after a few days.   rec Stop quinapril Start losartan 50/12.5 one  daily    02/22/2014 f/u ov/Laquisha Northcraft re: cough/ on dulera 200 2bid Chief Complaint  Patient presents with  . Follow-up    Pt states that her breathing is about the same. She also states that her cough is no better- still coughing up yellow sputum.  She has not used her rescue inhaler since the last visit.    really Not limited by breathing from desired activities   rec Try dulera 100 Take 2 puffs first thing in am and then another 2 puffs about 12 hours later.     05/25/2014 f/u ov/Adrielle Polakowski re: chronic cough/ prev acei exposure /improved on lower dose dulera - 100 2bid and still not smoking  Chief Complaint  Patient presents with  . Follow-up    Pt states that her breathing is doing better. She denies any new co's today.    Not limited by breathing from desired activities     No obvious day to day or daytime variabilty or assoc   cp or chest tightness, subjective wheeze overt sinus or hb symptoms. No unusual exp hx or h/o childhood pna/ asthma or knowledge of premature birth.  Sleeping ok without nocturnal  or early am exacerbation  of respiratory  c/o's or need for noct saba. Also denies any obvious fluctuation of symptoms with weather or environmental changes or  other aggravating or alleviating factors except as outlined above   Current Medications, Allergies, Complete Past Medical History, Past Surgical History, Family History, and Social History were reviewed in [employer] record.  ROS  The following are not active complaints unless bolded sore throat, dysphagia, dental problems, itching, sneezing,  nasal congestion or excess/ purulent secretions, ear ache,   fever, chills, sweats, unintended wt loss, pleuritic or exertional cp, hemoptysis,  orthopnea pnd or leg swelling, presyncope, palpitations, heartburn, abdominal pain, anorexia, nausea, vomiting, diarrhea  or change in bowel or urinary habits, change in stools or urine, dysuria,hematuria,  rash, arthralgias, visual  complaints, headache, numbness weakness or ataxia or problems with walking or coordination,  change in mood/affect or memory.                              Objective:  Physical Exam  amb wf nad   01/25/2014       108 >  02/22/2014  107 > 05/25/2014 105  Wt Readings from Last 3 Encounters:  01/11/14 109 lb (49.442 kg)  10/25/13 114 lb (51.71 kg)  10/10/13 110 lb (49.896 kg)      HEENT: nl dentition, turbinates, and orophanx. Nl external ear canals without cough reflex   NECK :  without JVD/Nodes/TM/ nl carotid upstrokes bilaterally   LUNGS: no acc muscle use, clear to A and P bilaterally without cough on insp or exp maneuvers   CV:  RRR  no s3 or murmur or increase in P2, no edema   ABD:  soft and nontender with nl excursion in the supine position. No bruits or organomegaly, bowel sounds nl  MS:  warm without deformities, calf tenderness, cyanosis or clubbing  SKIN: warm and dry without lesions    NEURO:  alert, approp, no deficits     CTa 03/06/14 images reviewed Previous GG changes have resolved  Several small nodular opacities are noted in the lungs bilaterally. Followup of the small nodular opacity should be based on Fleischner Society guidelines. If the patient is at high risk for bronchogenic carcinoma, follow-up chest CT at 1year is recommended      Assessment:

## 2014-05-28 ENCOUNTER — Encounter: Payer: Self-pay | Admitting: Internal Medicine

## 2014-05-28 NOTE — Assessment & Plan Note (Signed)
See study 11/10/13  CTa 03/06/14  Previous GG changes have resolved  Several small nodular opacities are noted in the lungs bilaterally. Followup of the small nodular opacity should be based on Fleischner Society guidelines. If the patient is at high risk for bronchogenic carcinoma, follow-up chest CT at 1year is recommended> placed in tickle file for 03/07/15

## 2014-05-28 NOTE — Assessment & Plan Note (Signed)
pfts with minimal obst 11/08/13 > try off spiriva 01/27/2014 > no change at ov 02/22/2014 so try reduce dulera to 100 2bid   So she has progressed from "refractory symptoms of airflow obst" to very little if any symptoms off ACEi and on doses of dulera used in Asthma but not really sure how much of that she has either at this point

## 2014-06-06 DIAGNOSIS — Z79899 Other long term (current) drug therapy: Secondary | ICD-10-CM | POA: Diagnosis not present

## 2014-06-06 DIAGNOSIS — M545 Low back pain: Secondary | ICD-10-CM | POA: Diagnosis not present

## 2014-06-06 DIAGNOSIS — M542 Cervicalgia: Secondary | ICD-10-CM | POA: Diagnosis not present

## 2014-06-07 DIAGNOSIS — L281 Prurigo nodularis: Secondary | ICD-10-CM | POA: Diagnosis not present

## 2014-06-14 ENCOUNTER — Other Ambulatory Visit: Payer: Self-pay | Admitting: Family Medicine

## 2014-06-15 NOTE — Telephone Encounter (Signed)
Refill appropriate and filled per protocol. 

## 2014-07-25 DIAGNOSIS — M542 Cervicalgia: Secondary | ICD-10-CM | POA: Diagnosis not present

## 2014-07-25 DIAGNOSIS — Z79899 Other long term (current) drug therapy: Secondary | ICD-10-CM | POA: Diagnosis not present

## 2014-07-25 DIAGNOSIS — M545 Low back pain: Secondary | ICD-10-CM | POA: Diagnosis not present

## 2014-07-26 DIAGNOSIS — M545 Low back pain: Secondary | ICD-10-CM | POA: Diagnosis not present

## 2014-07-26 DIAGNOSIS — Z79899 Other long term (current) drug therapy: Secondary | ICD-10-CM | POA: Diagnosis not present

## 2014-07-26 DIAGNOSIS — M542 Cervicalgia: Secondary | ICD-10-CM | POA: Diagnosis not present

## 2014-08-01 ENCOUNTER — Other Ambulatory Visit: Payer: Self-pay | Admitting: Family Medicine

## 2014-08-01 NOTE — Telephone Encounter (Signed)
Ok to refill??  Last office visit 04/25/2014.  Last refill 04/20/2014, #2 refills.

## 2014-08-01 NOTE — Telephone Encounter (Signed)
Okay to refill give 3

## 2014-08-01 NOTE — Telephone Encounter (Signed)
LRF 04/20/14 #30 + 2.  LOV 04/05/14  OK refill?

## 2014-08-01 NOTE — Telephone Encounter (Signed)
rx called in

## 2014-08-08 ENCOUNTER — Ambulatory Visit (INDEPENDENT_AMBULATORY_CARE_PROVIDER_SITE_OTHER): Payer: Medicare Other | Admitting: Family Medicine

## 2014-08-08 ENCOUNTER — Encounter: Payer: Self-pay | Admitting: Family Medicine

## 2014-08-08 VITALS — BP 132/74 | HR 90 | Temp 98.7°F | Resp 20 | Ht 60.0 in | Wt 100.0 lb

## 2014-08-08 DIAGNOSIS — J301 Allergic rhinitis due to pollen: Secondary | ICD-10-CM

## 2014-08-08 DIAGNOSIS — G8929 Other chronic pain: Secondary | ICD-10-CM

## 2014-08-08 DIAGNOSIS — L905 Scar conditions and fibrosis of skin: Secondary | ICD-10-CM | POA: Diagnosis not present

## 2014-08-08 DIAGNOSIS — R7402 Elevation of levels of lactic acid dehydrogenase (LDH): Secondary | ICD-10-CM

## 2014-08-08 DIAGNOSIS — I10 Essential (primary) hypertension: Secondary | ICD-10-CM

## 2014-08-08 DIAGNOSIS — Z1231 Encounter for screening mammogram for malignant neoplasm of breast: Secondary | ICD-10-CM

## 2014-08-08 DIAGNOSIS — R740 Nonspecific elevation of levels of transaminase and lactic acid dehydrogenase [LDH]: Secondary | ICD-10-CM | POA: Diagnosis not present

## 2014-08-08 DIAGNOSIS — R7401 Elevation of levels of liver transaminase levels: Secondary | ICD-10-CM

## 2014-08-08 DIAGNOSIS — E785 Hyperlipidemia, unspecified: Secondary | ICD-10-CM | POA: Diagnosis not present

## 2014-08-08 DIAGNOSIS — M810 Age-related osteoporosis without current pathological fracture: Secondary | ICD-10-CM

## 2014-08-08 DIAGNOSIS — H9313 Tinnitus, bilateral: Secondary | ICD-10-CM

## 2014-08-08 MED ORDER — FLUTICASONE PROPIONATE 50 MCG/ACT NA SUSP: 2.0000 | Freq: Every day | NASAL | Status: DC | PRN

## 2014-08-08 NOTE — Assessment & Plan Note (Signed)
BP well controlled, fasting labs today
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
## 2014-08-08 NOTE — Assessment & Plan Note (Signed)
Flonase as needed for allergies

## 2014-08-08 NOTE — Assessment & Plan Note (Signed)
Bone Density and Mammogram needed Long history of smoking

## 2014-08-08 NOTE — Patient Instructions (Signed)
Schedule your Mammogram  Schedule your bone density We will call with lab results F/U 4 months

## 2014-08-09 ENCOUNTER — Telehealth: Payer: Self-pay | Admitting: Gastroenterology

## 2014-08-09 LAB — COMPREHENSIVE METABOLIC PANEL
ALK PHOS: 124 U/L — AB (ref 39–117)
ALT: 41 U/L — AB (ref 0–35)
AST: 62 U/L — ABNORMAL HIGH (ref 0–37)
Albumin: 4.4 g/dL (ref 3.5–5.2)
BILIRUBIN TOTAL: 0.6 mg/dL (ref 0.2–1.2)
BUN: 8 mg/dL (ref 6–23)
CO2: 26 mEq/L (ref 19–32)
CREATININE: 0.43 mg/dL — AB (ref 0.50–1.10)
Calcium: 9.3 mg/dL (ref 8.4–10.5)
Chloride: 100 mEq/L (ref 96–112)
Glucose, Bld: 83 mg/dL (ref 70–99)
Potassium: 4.1 mEq/L (ref 3.5–5.3)
Sodium: 139 mEq/L (ref 135–145)
Total Protein: 6.9 g/dL (ref 6.0–8.3)

## 2014-08-09 LAB — LIPID PANEL
Cholesterol: 265 mg/dL — ABNORMAL HIGH (ref 0–200)
HDL: 152 mg/dL (ref >=46)
LDL CALC: 103 mg/dL — AB (ref 0–99)
TRIGLYCERIDES: 48 mg/dL (ref <150)
Total CHOL/HDL Ratio: 1.7 Ratio
VLDL: 10 mg/dL (ref 0–40)

## 2014-08-09 LAB — CBC WITH DIFFERENTIAL/PLATELET
Basophils Absolute: 0.1 10*3/uL (ref 0.0–0.1)
Basophils Relative: 2 % — ABNORMAL HIGH (ref 0–1)
Eosinophils Absolute: 0 10*3/uL (ref 0.0–0.7)
Eosinophils Relative: 1 % (ref 0–5)
HCT: 43.2 % (ref 36.0–46.0)
Hemoglobin: 15.2 g/dL — ABNORMAL HIGH (ref 12.0–15.0)
LYMPHS PCT: 25 % (ref 12–46)
Lymphs Abs: 0.9 10*3/uL (ref 0.7–4.0)
MCH: 35.6 pg — AB (ref 26.0–34.0)
MCHC: 35.2 g/dL (ref 30.0–36.0)
MCV: 101.2 fL — AB (ref 78.0–100.0)
MONOS PCT: 17 % — AB (ref 3–12)
MPV: 9.6 fL (ref 8.6–12.4)
Monocytes Absolute: 0.6 10*3/uL (ref 0.1–1.0)
Neutro Abs: 1.9 10*3/uL (ref 1.7–7.7)
Neutrophils Relative %: 55 % (ref 43–77)
Platelets: 249 10*3/uL (ref 150–400)
RBC: 4.27 MIL/uL (ref 3.87–5.11)
RDW: 13.3 % (ref 11.5–15.5)
WBC: 3.4 10*3/uL — AB (ref 4.0–10.5)

## 2014-08-09 NOTE — Telephone Encounter (Signed)
PLEASE CALL PT. DR. Buelah Manis SENT HER LAB RESULTS. HER LIVER ENZYMES ARE STILL ELEVATED AND SHE STILL NEEDS AN MRCP.

## 2014-08-10 ENCOUNTER — Ambulatory Visit (HOSPITAL_COMMUNITY)
Admission: RE | Admit: 2014-08-10 | Discharge: 2014-08-10 | Disposition: A | Discharge status: Home / Self Care | Payer: Medicare Other | Source: Ambulatory Visit | Attending: Family Medicine | Admitting: Family Medicine

## 2014-08-10 ENCOUNTER — Other Ambulatory Visit: Payer: Self-pay

## 2014-08-10 DIAGNOSIS — R748 Abnormal levels of other serum enzymes: Secondary | ICD-10-CM

## 2014-08-10 DIAGNOSIS — M810 Age-related osteoporosis without current pathological fracture: Secondary | ICD-10-CM

## 2014-08-10 NOTE — Telephone Encounter (Signed)
Pt is scheduled for 08/16/2014 @ 9:45am. Pt aware

## 2014-08-10 NOTE — Telephone Encounter (Signed)
Per Dr. Oneida Alar verbal clarifiction pt needs an abd mri w/ mrcp

## 2014-08-15 ENCOUNTER — Telehealth: Payer: Self-pay | Admitting: *Deleted

## 2014-08-15 NOTE — Telephone Encounter (Signed)
Request for Quanitiy limit exception received from [employer]. The form has been completed, needs signature of MD. I will fax back with attn: Dr. Melvyn Novas to triage. I will also send message to Magda Paganini to get signature and fax.

## 2014-08-16 ENCOUNTER — Ambulatory Visit (HOSPITAL_COMMUNITY)
Admission: RE | Admit: 2014-08-16 | Discharge: 2014-08-16 | Disposition: A | Discharge status: Home / Self Care | Payer: Medicare Other | Source: Ambulatory Visit | Attending: Gastroenterology | Admitting: Gastroenterology

## 2014-08-16 ENCOUNTER — Other Ambulatory Visit: Payer: Self-pay | Admitting: Gastroenterology

## 2014-08-16 DIAGNOSIS — R740 Nonspecific elevation of levels of transaminase and lactic acid dehydrogenase [LDH]: Secondary | ICD-10-CM | POA: Insufficient documentation

## 2014-08-16 DIAGNOSIS — K828 Other specified diseases of gallbladder: Secondary | ICD-10-CM | POA: Diagnosis not present

## 2014-08-16 DIAGNOSIS — R748 Abnormal levels of other serum enzymes: Secondary | ICD-10-CM

## 2014-08-16 MED ORDER — SODIUM CHLORIDE 0.9 % IV SOLN
INTRAVENOUS | Status: AC
  Filled 2014-08-16: qty 100

## 2014-08-16 MED ORDER — SODIUM CHLORIDE 0.9 % IJ SOLN
INTRAMUSCULAR | Status: AC
  Filled 2014-08-16: qty 6

## 2014-08-16 MED ORDER — GADOBENATE DIMEGLUMINE 529 MG/ML IV SOLN
10.0000 mL | Freq: Once | INTRAVENOUS | Status: AC | PRN
  Administered 2014-08-16: 10 mL via INTRAVENOUS

## 2014-08-16 NOTE — Telephone Encounter (Signed)
Form completed by Dr Melvyn Novas and faxed back.  Will forward message to [place] to f/u on approval.

## 2014-08-16 NOTE — Telephone Encounter (Signed)
I no longer follow her and don't know what her ongoing needs are but can use symbicort 80 or advair 45 in place of dulera if formulary restriction applies

## 2014-08-16 NOTE — Telephone Encounter (Signed)
PA form received and placed in MW's look at for completion

## 2014-08-18 ENCOUNTER — Telehealth: Payer: Self-pay | Admitting: *Deleted

## 2014-08-18 NOTE — Telephone Encounter (Signed)
Received call from patient.   Reports that she is no longer followed by Dr. Melvyn Novas with [facility]. States that her Dulera inhaler is not covered by insurance and requested to change.   MD please advise.

## 2014-08-19 MED ORDER — BUDESONIDE-FORMOTEROL FUMARATE 80-4.5 MCG/ACT IN AERO: 2.0000 | INHALATION_SPRAY | Freq: Two times a day (BID) | RESPIRATORY_TRACT | Status: DC

## 2014-08-19 NOTE — Telephone Encounter (Signed)
Prescription sent to pharmacy.   Call placed to patient and patient made aware.

## 2014-08-19 NOTE — Telephone Encounter (Signed)
Send in Symbircort 80-4.5 2 puffs BID

## 2014-08-23 ENCOUNTER — Encounter: Payer: Self-pay | Admitting: Gastroenterology

## 2014-08-23 NOTE — Telephone Encounter (Signed)
APPOINTMENT MADE AND LETTER SENT °

## 2014-08-23 NOTE — Telephone Encounter (Addendum)
PLEASE CALL PT. NO SOURCE FOR HER ELEVATED LIVER ENZYMES WAS IDENTIFIED ON THE MRCP. HER MRCP DID SHOW SLUDGE IN THE GALLBLADDER AND POSSIBLE PANCREAS DIVISUM(PD). PANCREAS DIVISUM MEANS THE MAJORITY OF HER PANCREAS DRAINS THROUGH THE MINOR OPENING. IT MEANS SHE COULD DEVELOP PANCREATITIS IN THE FUTURE BUT IT IS NOTHING TO WORRY ABOUT. MOST OF THE TIME IT'S JUST AN INCIDENTAL FINDING ON MRIs. [facility] MAY 2016 E30 HEPATITIS/CROHN'S DISEASE.

## 2014-08-23 NOTE — Telephone Encounter (Signed)
Pt called her pcp and had them sent rx for Symbicor to her pharmacy. She notified them that she is no longer followed by Dr. Melvyn Novas see 08/18/14 message. FYI: I guess we were working on her PA for dulera.

## 2014-08-23 NOTE — Telephone Encounter (Signed)
Pt is aware of results. 

## 2014-09-09 ENCOUNTER — Ambulatory Visit (HOSPITAL_COMMUNITY)
Admission: RE | Admit: 2014-09-09 | Discharge: 2014-09-09 | Disposition: A | Discharge status: Home / Self Care | Payer: Medicare Other | Source: Ambulatory Visit | Attending: Family Medicine | Admitting: Family Medicine

## 2014-09-09 DIAGNOSIS — M810 Age-related osteoporosis without current pathological fracture: Secondary | ICD-10-CM | POA: Insufficient documentation

## 2014-09-09 DIAGNOSIS — Z780 Asymptomatic menopausal state: Secondary | ICD-10-CM | POA: Insufficient documentation

## 2014-09-09 DIAGNOSIS — Z1231 Encounter for screening mammogram for malignant neoplasm of breast: Secondary | ICD-10-CM | POA: Diagnosis not present

## 2014-09-09 DIAGNOSIS — E559 Vitamin D deficiency, unspecified: Secondary | ICD-10-CM | POA: Diagnosis not present

## 2014-09-09 DIAGNOSIS — M8580 Other specified disorders of bone density and structure, unspecified site: Secondary | ICD-10-CM | POA: Diagnosis not present

## 2014-09-16 ENCOUNTER — Encounter (HOSPITAL_COMMUNITY): Payer: Medicare Other

## 2014-09-16 ENCOUNTER — Inpatient Hospital Stay (HOSPITAL_COMMUNITY): Admission: RE | Admit: 2014-09-16 | Payer: Medicare Other | Source: Ambulatory Visit

## 2014-09-18 ENCOUNTER — Other Ambulatory Visit: Payer: Self-pay | Admitting: Family Medicine

## 2014-09-19 NOTE — Telephone Encounter (Signed)
Medication refilled per protocol. 

## 2014-09-26 ENCOUNTER — Encounter (INDEPENDENT_AMBULATORY_CARE_PROVIDER_SITE_OTHER): Payer: Self-pay

## 2014-09-26 ENCOUNTER — Encounter (HOSPITAL_COMMUNITY)
Admission: RE | Admit: 2014-09-26 | Discharge: 2014-09-26 | Disposition: A | Discharge status: Home / Self Care | Payer: Medicare Other | Source: Ambulatory Visit | Attending: Family Medicine | Admitting: Family Medicine

## 2014-09-26 ENCOUNTER — Ambulatory Visit (INDEPENDENT_AMBULATORY_CARE_PROVIDER_SITE_OTHER): Payer: Medicare Other | Admitting: Gastroenterology

## 2014-09-26 ENCOUNTER — Encounter (HOSPITAL_COMMUNITY): Payer: Self-pay

## 2014-09-26 ENCOUNTER — Encounter: Payer: Self-pay | Admitting: Gastroenterology

## 2014-09-26 VITALS — BP 110/72 | HR 89 | Temp 97.1°F | Ht 60.0 in | Wt 108.6 lb

## 2014-09-26 DIAGNOSIS — K219 Gastro-esophageal reflux disease without esophagitis: Secondary | ICD-10-CM | POA: Diagnosis not present

## 2014-09-26 DIAGNOSIS — R740 Nonspecific elevation of levels of transaminase and lactic acid dehydrogenase [LDH]: Secondary | ICD-10-CM

## 2014-09-26 DIAGNOSIS — R1310 Dysphagia, unspecified: Secondary | ICD-10-CM

## 2014-09-26 DIAGNOSIS — R7401 Elevation of levels of liver transaminase levels: Secondary | ICD-10-CM

## 2014-09-26 DIAGNOSIS — M810 Age-related osteoporosis without current pathological fracture: Secondary | ICD-10-CM | POA: Diagnosis not present

## 2014-09-26 DIAGNOSIS — K5090 Crohn's disease, unspecified, without complications: Secondary | ICD-10-CM

## 2014-09-26 LAB — COMPREHENSIVE METABOLIC PANEL
ALBUMIN: 4 g/dL (ref 3.5–5.0)
ALK PHOS: 99 U/L (ref 38–126)
ALT: 25 U/L (ref 14–54)
ANION GAP: 11 (ref 5–15)
AST: 42 U/L — ABNORMAL HIGH (ref 15–41)
BUN: 7 mg/dL (ref 6–20)
CALCIUM: 8.7 mg/dL — AB (ref 8.9–10.3)
CO2: 25 mmol/L (ref 22–32)
Chloride: 100 mmol/L — ABNORMAL LOW (ref 101–111)
Creatinine, Ser: 0.44 mg/dL (ref 0.44–1.00)
GFR calc Af Amer: >60 mL/min (ref >60)
GFR calc non Af Amer: >60 mL/min (ref >60)
GLUCOSE: 78 mg/dL (ref 65–99)
Potassium: 3.7 mmol/L (ref 3.5–5.1)
Sodium: 136 mmol/L (ref 135–145)
TOTAL PROTEIN: 6.7 g/dL (ref 6.5–8.1)
Total Bilirubin: 0.4 mg/dL (ref 0.3–1.2)

## 2014-09-26 LAB — PHOSPHORUS: Phosphorus: 3.5 mg/dL (ref 2.5–4.6)

## 2014-09-26 LAB — MAGNESIUM: MAGNESIUM: 1.6 mg/dL — AB (ref 1.7–2.4)

## 2014-09-26 MED ORDER — ZOLEDRONIC ACID 5 MG/100ML IV SOLN
5.0000 mg | Freq: Once | INTRAVENOUS | Status: AC
  Administered 2014-09-26: 5 mg via INTRAVENOUS
  Filled 2014-09-26: qty 100

## 2014-09-26 NOTE — Progress Notes (Signed)
Subjective:    Patient ID: [name] [name], female    DOB: March 10, 1956, 59 y.o.   MRN: [id number]  [name], MD  HPI Gained 4 lbs since NOV 2015. BMs: CONSTIPATION TO DIARRHEA. DRINKS LACTOSE FREE MILK. DOESN'T LIKE SOY MILK. IS A CHEESE FANATICS. NOT USING LACTASE. NAUSEA/HEARTBURN FAIRLY WELL CONTROLLED.   PT DENIES FEVER, CHILLS, HEMATOCHEZIA, nausea, vomiting, melena, diarrhea, CHANGE IN BOWEL IN HABITS, constipation, abdominal pain, problems swallowing, OR heartburn or indigestion.   Past Medical History  Diagnosis Date  . Elevated liver enzymes 2009: BMI 24 ?Etoh ALT 94, AST 51,  NEG ANA, qIGs& ASMA    MAY 2012 AST 52 ALT 38  . Anastomotic ulcer JAN 2009  . Esophageal stricture 2009  . Diarrhea MULITIFACTORIAL    IBS, LACTOSE INTOLERANCE, SBBO, BILE-SALT  . Inflammatory bowel disease 2006 CD    SURGICAL REMISSION  . LBP (low back pain)   . Migraine   . Asthma   . Allergic rhinitis   . CTS (carpal tunnel syndrome)   . Hyperlipemia   . Anxiety   . Hemorrhoid   . Crohn disease   . BMI (body mass index) 20.0-29.9 2009 121 lbs  . 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 Shortness of breath     exertion and humidity  . GERD (gastroesophageal reflux disease)   . Hypertension   . Sleep apnea     Stop Bang Cock score of 4  . PONV (postoperative nausea and vomiting)   . Crohn disease    Past Surgical History  Procedure Laterality Date  . Hemicolectomy  RIGHT 2006  . Hernia repair    . Carpal tunnel release  LEFT  . Lumbar disc surgery    . Vocal cord surgery  Sep 20, 2010    precancerous areas removed  . Esophagogastroduodenoscopy  02/07/09    mild gastritis  . Colonoscopy  JAN 2009 DIARRHEA, ABD PAIN, BRBPR    ANASTOMOTIC ULCER(3MM), IH TG:GYIRSW ULCER, NL COLON bX  . Upper gastrointestinal endoscopy  JAN 2009 ABD PAIN    GASTRITIS, ESO RING  . Upper gastrointestinal endoscopy  OCT 2010 ABD PAIN, DYSPHAGIA    DIL 15MM, GASTRITIS, NL DUODENUM  . Upper  gastrointestinal endoscopy  OCT 2010 DYSPHAGIA    DIL 17 MM, GASTRITIS, NL DUODENUM  . Dilation and curettage of uterus    . Bilateral salpingoophorectomy    . Wisdom tooth extraction      pin placement due to broken jaw  . Colon surgery    . Back surgery    . Esophageal dilation  12/24/2011    SLF: A stricture was found in the distal esophagus/ Moderate gastritis/ MILD Duodenitis  . Esophageal biopsy  12/24/2011    Procedure: BIOPSY;  Surgeon: Danie Binder, MD;  Location: [facility];  Service: Endoscopy;;  Gastric Biopsies  . Esophagogastroduodenoscopy (egd) with propofol N/A 06/30/2012    Procedure: ESOPHAGOGASTRODUODENOSCOPY (EGD) WITH PROPOFOL;  Surgeon: Danie Binder, MD;  Location: [facility];  Service: Endoscopy;  Laterality: N/A;  . Savory dilation N/A 06/30/2012    Procedure: SAVORY DILATION;  Surgeon: Danie Binder, MD;  Location: [facility];  Service: Endoscopy;  Laterality: N/A;  12.11m , 132m 1567m. Esophageal biopsy N/A 06/30/2012    Procedure: BIOPSY;  Surgeon: SanDanie BinderD;  Location: [facility];  Service: Endoscopy;  Laterality: N/A;  Gastric and Esophageal Biopsies    Allergies  Allergen Reactions  . Lovastatin Other (See  Comments)    Chest pain  . Nexium [Esomeprazole Magnesium] Diarrhea    Abdominal pain    Current Outpatient Prescriptions  Medication Sig Dispense Refill  . albuterol (PROVENTIL HFA;VENTOLIN HFA) 108 (90 BASE) MCG/ACT inhaler Inhale 2 puffs into the lungs every 4 (four) hours as needed for wheezing or shortness of breath.    Marland Kitchen amLODipine (NORVASC) 5 MG tablet TAKE ONE TABLET BY MOUTH ONCE DAILY    . budesonide-formoterol (SYMBICORT) 80-4.5 MCG/ACT inhaler Inhale 2 puffs into the lungs 2 (two) times daily.    . calcium carbonate (TUMS - DOSED IN MG ELEMENTAL CALCIUM) 500 MG chewable tablet Chew 1-2 tablets by mouth 2 (two) times daily as needed for indigestion or heartburn.    . cholestyramine (QUESTRAN) 4 GM/DOSE powder TAKE 2 GRAMS BY MOUTH BID WITH A  MEAL.     . cyclobenzaprine (FLEXERIL) 5 MG tablet Take 5 mg by mouth 2 (two) times daily as needed for muscle spasms.     . diazepam (VALIUM) 5 MG tablet TAKE ONE TABLET BY MOUTH Q12 H PRN     . fluorometholone (FML) 0.1 % ophthalmic ointment 1 application 4 (four) times daily.    . fluticasone (FLONASE) 50 MCG/ACT nasal spray Place 2 sprays into both nostrils daily as needed for allergies or rhinitis.    Marland Kitchen gabapentin (NEURONTIN) 300 MG capsule Take 300 mg by mouth 2 (two) times daily.    Marland Kitchen HYDROcodone-acetaminophen (NORCO) 10-325 MG per tablet Take 1 tablet by mouth every 6 (six) hours PRN    . ketoconazole (NIZORAL) 2 % shampoo Apply 1 application topically 2 (two) times a week. For 4 weeks1    . losartan-hydrochlorothiazide (HYZAAR) 50-12.5 MG per tablet Take 1 tablet by mouth daily.    . mupirocin ointment (BACTROBAN) 2 % Apply 1 application topically 2 (two) times daily.    Marland Kitchen nystatin (MYCOSTATIN) 100000 UNIT/ML suspension Take 5 mLs (500,000 Units total) by mouth 4 (four) times daily. Just as needed     Review of Systems     Objective:   Physical Exam  Constitutional: She is oriented to person, place, and time. She appears well-developed and well-nourished. No distress.  HENT:  Head: Normocephalic and atraumatic.  Mouth/Throat: Oropharynx is clear and moist. No oropharyngeal exudate.  Eyes: Pupils are equal, round, and reactive to light. No scleral icterus.  Neck: Normal range of motion. Neck supple.  Cardiovascular: Normal rate, regular rhythm and normal heart sounds.   Pulmonary/Chest: Effort normal and breath sounds normal. No respiratory distress.  Abdominal: Soft. Bowel sounds are normal. She exhibits no distension. There is no tenderness.  Musculoskeletal: She exhibits no edema.  Lymphadenopathy:    She has no cervical adenopathy.  Neurological: She is alert and oriented to person, place, and time.  NO FOCAL DEFICITS   Psychiatric: She has a normal mood and affect.    Vitals reviewed.         Assessment & Plan:

## 2014-09-26 NOTE — Progress Notes (Signed)
Results for MINAHIL, QUINLIVAN (MRN [id number]) as of 09/26/2014 15:44  Ref. Range 09/26/2014 10:55  Sodium Latest Ref Range: 135-145 mmol/L 136  Potassium Latest Ref Range: 3.5-5.1 mmol/L 3.7  Chloride Latest Ref Range: 101-111 mmol/L 100 (L)  CO2 Latest Ref Range: 22-32 mmol/L 25  BUN Latest Ref Range: 6-20 mg/dL 7  Creatinine Latest Ref Range: 0.44-1.00 mg/dL 0.44  Calcium Latest Ref Range: 8.9-10.3 mg/dL 8.7 (L)  EGFR (Non-African Amer.) Latest Ref Range: >60 mL/min >60  EGFR (African American) Latest Ref Range: >60 mL/min >60  Glucose Latest Ref Range: 65-99 mg/dL 78  Anion gap Latest Ref Range: 5-15  11  Phosphorus Latest Ref Range: 2.5-4.6 mg/dL 3.5  Magnesium Latest Ref Range: 1.7-2.4 mg/dL 1.6 (L)  Alkaline Phosphatase Latest Ref Range: 38-126 U/L 99  Albumin Latest Ref Range: 3.5-5.0 g/dL 4.0  AST Latest Ref Range: 15-41 U/L 42 (H)  ALT Latest Ref Range: 14-54 U/L 25  Total Protein Latest Ref Range: 6.5-8.1 g/dL 6.7  Total Bilirubin Latest Ref Range: 0.3-1.2 mg/dL 0.4

## 2014-09-26 NOTE — Assessment & Plan Note (Signed)
IMPROVED SINCE 2015. LAST HFP APR 2016 NEAR NL AST/ALT. MRCP 2016: PD NO PSC.  REPEAT HFP Q6 MOS. FOLLOW UP IN 6 MOS.

## 2014-09-26 NOTE — Patient Instructions (Signed)
DRINK WATER TO KEEP YOUR URINE LIGHT YELLOW.  FOLLOW A LOW FAT DIET. SEE INFO BELOW.  IF YOU FEEL LIKE EATING DAIRY, TAKE LACTASE PILLS, EAT LACTOSE FREE ICE CREAM, OR DRINK LACTAID MILK.  CONTINUE QUESTRAN.  FOLLOW UP IN 6 MOS.   Low-Fat Diet BREADS, CEREALS, PASTA, RICE, DRIED PEAS, AND BEANS These products are high in carbohydrates and most are low in fat. Therefore, they can be increased in the diet as substitutes for fatty foods. They too, however, contain calories and should not be eaten in excess. Cereals can be eaten for snacks as well as for breakfast.   FRUITS AND VEGETABLES It is good to eat fruits and vegetables. Besides being sources of fiber, both are rich in vitamins and some minerals. They help you get the daily allowances of these nutrients. Fruits and vegetables can be used for snacks and desserts.  MEATS Limit lean meat, chicken, [place], and fish to no more than 6 ounces per day. Beef, Pork, and Lamb Use lean cuts of beef, pork, and lamb. Lean cuts include:  Extra-lean ground beef.  Arm roast.  Sirloin tip.  Center-cut ham.  Round steak.  Loin chops.  Rump roast.  Tenderloin.  Trim all fat off the outside of meats before cooking. It is not necessary to severely decrease the intake of red meat, but lean choices should be made. Lean meat is rich in protein and contains a highly absorbable form of iron. Premenopausal women, in particular, should avoid reducing lean red meat because this could increase the risk for low red blood cells (iron-deficiency anemia).  Chicken and [place] These are good sources of protein. The fat of poultry can be reduced by removing the skin and underlying fat layers before cooking. Chicken and [place] can be substituted for lean red meat in the diet. Poultry should not be fried or covered with high-fat sauces. Fish and Shellfish Fish is a good source of protein. Shellfish contain cholesterol, but they usually are low in saturated fatty  acids. The preparation of fish is important. Like chicken and [place], they should not be fried or covered with high-fat sauces. EGGS Egg whites contain no fat or cholesterol. They can be eaten often. Try 1 to 2 egg whites instead of whole eggs in recipes or use egg substitutes that do not contain yolk. MILK AND DAIRY PRODUCTS Use skim or 1% milk instead of 2% or whole milk. Decrease whole milk, natural, and processed cheeses. Use nonfat or low-fat (2%) cottage cheese or low-fat cheeses made from vegetable oils. Choose nonfat or low-fat (1 to 2%) yogurt. Experiment with evaporated skim milk in recipes that call for heavy cream. Substitute low-fat yogurt or low-fat cottage cheese for sour cream in dips and salad dressings. Have at least 2 servings of low-fat dairy products, such as 2 glasses of skim (or 1%) milk each day to help get your daily calcium intake. FATS AND OILS Reduce the total intake of fats, especially saturated fat. Butterfat, lard, and beef fats are high in saturated fat and cholesterol. These should be avoided as much as possible. Vegetable fats do not contain cholesterol, but certain vegetable fats, such as coconut oil, palm oil, and palm kernel oil are very high in saturated fats. These should be limited. These fats are often used in bakery goods, processed foods, popcorn, oils, and nondairy creamers. Vegetable shortenings and some peanut butters contain hydrogenated oils, which are also saturated fats. Read the labels on these foods and check for saturated vegetable  oils. Unsaturated vegetable oils and fats do not raise blood cholesterol. However, they should be limited because they are fats and are high in calories. Total fat should still be limited to 30% of your daily caloric intake. Desirable liquid vegetable oils are corn oil, cottonseed oil, olive oil, canola oil, safflower oil, soybean oil, and sunflower oil. Peanut oil is not as good, but small amounts are acceptable. Buy a  heart-healthy tub margarine that has no partially hydrogenated oils in the ingredients. Mayonnaise and salad dressings often are made from unsaturated fats, but they should also be limited because of their high calorie and fat content. Seeds, nuts, peanut butter, olives, and avocados are high in fat, but the fat is mainly the unsaturated type. These foods should be limited mainly to avoid excess calories and fat. OTHER EATING TIPS Snacks  Most sweets should be limited as snacks. They tend to be rich in calories and fats, and their caloric content outweighs their nutritional value. Some good choices in snacks are graham crackers, melba toast, soda crackers, bagels (no egg), English muffins, fruits, and vegetables. These snacks are preferable to snack crackers, [place] fries, TORTILLA CHIPS, and POTATO chips. Popcorn should be air-popped or cooked in small amounts of liquid vegetable oil. Desserts Eat fruit, low-fat yogurt, and fruit ices instead of pastries, cake, and cookies. Sherbet, angel food cake, gelatin dessert, frozen low-fat yogurt, or other frozen products that do not contain saturated fat (pure fruit juice bars, frozen ice pops) are also acceptable.  COOKING METHODS Choose those methods that use little or no fat. They include: Poaching.  Braising.  Steaming.  Grilling.  Baking.  Stir-frying.  Broiling.  Microwaving.  Foods can be cooked in a nonstick pan without added fat, or use a nonfat cooking spray in regular cookware. Limit fried foods and avoid frying in saturated fat. Add moisture to lean meats by using water, broth, cooking wines, and other nonfat or low-fat sauces along with the cooking methods mentioned above. Soups and stews should be chilled after cooking. The fat that forms on top after a few hours in the refrigerator should be skimmed off. When preparing meals, avoid using excess salt. Salt can contribute to raising blood pressure in some people.  EATING AWAY FROM  HOME Order entres, potatoes, and vegetables without sauces or butter. When meat exceeds the size of a deck of cards (3 to 4 ounces), the rest can be taken home for another meal. Choose vegetable or fruit salads and ask for low-calorie salad dressings to be served on the side. Use dressings sparingly. Limit high-fat toppings, such as bacon, crumbled eggs, cheese, sunflower seeds, and olives. Ask for heart-healthy tub margarine instead of butter.

## 2014-09-26 NOTE — Assessment & Plan Note (Signed)
SYMPTOMS FAIRLY WELL CONTROLLED ON DIET.  CONTINUE TO MONITOR SYMPTOMS. LOW FAT DIET FOLLOW UP IN 6 MOS.

## 2014-09-26 NOTE — Assessment & Plan Note (Signed)
SYMPTOMS FAIRLY WELL CONTROLLED. SYMPTOMS EXACERBATED BY LACTOSE/FATTY MEALS.  CONTINUE TO MONITOR SYMPTOMS. CONTINUE QUESTRAN AND LACTASE. FOLLOW UP IN 6 MOS.

## 2014-09-26 NOTE — Assessment & Plan Note (Signed)
SYMPTOMS FAIRLY WELL CONTROLLED.  CONTINUE TO MONITOR SYMPTOMS. FOLLOW UP IN 6 MOS.

## 2014-09-27 DIAGNOSIS — M542 Cervicalgia: Secondary | ICD-10-CM | POA: Diagnosis not present

## 2014-09-27 DIAGNOSIS — M545 Low back pain: Secondary | ICD-10-CM | POA: Diagnosis not present

## 2014-09-27 DIAGNOSIS — Z79899 Other long term (current) drug therapy: Secondary | ICD-10-CM | POA: Diagnosis not present

## 2014-09-27 NOTE — Progress Notes (Signed)
cc'ed to pcp °

## 2014-10-26 ENCOUNTER — Other Ambulatory Visit: Payer: Self-pay | Admitting: Family Medicine

## 2014-10-26 NOTE — Telephone Encounter (Signed)
LRF diazepam 08/01/14 #30 + 2. LOV 08/08/14.  OK refill?

## 2014-10-26 NOTE — Telephone Encounter (Signed)
Okay to refill? 

## 2014-10-27 NOTE — Telephone Encounter (Signed)
Medication refilled per protocol. 

## 2014-11-22 DIAGNOSIS — M542 Cervicalgia: Secondary | ICD-10-CM | POA: Diagnosis not present

## 2014-11-22 DIAGNOSIS — M545 Low back pain: Secondary | ICD-10-CM | POA: Diagnosis not present

## 2014-11-22 DIAGNOSIS — Z79899 Other long term (current) drug therapy: Secondary | ICD-10-CM | POA: Diagnosis not present

## 2014-12-09 ENCOUNTER — Ambulatory Visit: Payer: Medicare Other | Admitting: Family Medicine

## 2014-12-27 ENCOUNTER — Ambulatory Visit (INDEPENDENT_AMBULATORY_CARE_PROVIDER_SITE_OTHER): Payer: Medicare Other | Admitting: Family Medicine

## 2014-12-27 ENCOUNTER — Encounter: Payer: Self-pay | Admitting: Family Medicine

## 2014-12-27 VITALS — BP 134/78 | HR 92 | Temp 98.6°F | Resp 20 | Ht 60.0 in | Wt 102.0 lb

## 2014-12-27 DIAGNOSIS — M150 Primary generalized (osteo)arthritis: Secondary | ICD-10-CM | POA: Diagnosis not present

## 2014-12-27 DIAGNOSIS — E785 Hyperlipidemia, unspecified: Secondary | ICD-10-CM

## 2014-12-27 DIAGNOSIS — H04123 Dry eye syndrome of bilateral lacrimal glands: Secondary | ICD-10-CM

## 2014-12-27 DIAGNOSIS — I10 Essential (primary) hypertension: Secondary | ICD-10-CM | POA: Diagnosis not present

## 2014-12-27 DIAGNOSIS — M810 Age-related osteoporosis without current pathological fracture: Secondary | ICD-10-CM

## 2014-12-27 DIAGNOSIS — H01119 Allergic dermatitis of unspecified eye, unspecified eyelid: Secondary | ICD-10-CM

## 2014-12-27 DIAGNOSIS — M8949 Other hypertrophic osteoarthropathy, multiple sites: Secondary | ICD-10-CM

## 2014-12-27 DIAGNOSIS — R790 Abnormal level of blood mineral: Secondary | ICD-10-CM

## 2014-12-27 DIAGNOSIS — M1990 Unspecified osteoarthritis, unspecified site: Secondary | ICD-10-CM | POA: Insufficient documentation

## 2014-12-27 DIAGNOSIS — M159 Polyosteoarthritis, unspecified: Secondary | ICD-10-CM

## 2014-12-27 LAB — COMPREHENSIVE METABOLIC PANEL
ALBUMIN: 4.2 g/dL (ref 3.6–5.1)
ALT: 25 U/L (ref 6–29)
AST: 37 U/L — ABNORMAL HIGH (ref 10–35)
Alkaline Phosphatase: 92 U/L (ref 33–130)
BUN: 8 mg/dL (ref 7–25)
CHLORIDE: 97 mmol/L — AB (ref 98–110)
CO2: 28 mmol/L (ref 20–31)
Calcium: 9.6 mg/dL (ref 8.6–10.4)
Creat: 0.42 mg/dL — ABNORMAL LOW (ref 0.50–1.05)
Glucose, Bld: 87 mg/dL (ref 70–99)
POTASSIUM: 4 mmol/L (ref 3.5–5.3)
Sodium: 138 mmol/L (ref 135–146)
TOTAL PROTEIN: 6.6 g/dL (ref 6.1–8.1)
Total Bilirubin: 0.8 mg/dL (ref 0.2–1.2)

## 2014-12-27 LAB — CBC WITH DIFFERENTIAL/PLATELET
BASOS PCT: 0 % (ref 0–1)
Basophils Absolute: 0 10*3/uL (ref 0.0–0.1)
EOS PCT: 0 % (ref 0–5)
Eosinophils Absolute: 0 10*3/uL (ref 0.0–0.7)
HCT: 42.1 % (ref 36.0–46.0)
Hemoglobin: 14.7 g/dL (ref 12.0–15.0)
LYMPHS ABS: 0.7 10*3/uL (ref 0.7–4.0)
Lymphocytes Relative: 10 % — ABNORMAL LOW (ref 12–46)
MCH: 35.3 pg — AB (ref 26.0–34.0)
MCHC: 34.9 g/dL (ref 30.0–36.0)
MCV: 101 fL — AB (ref 78.0–100.0)
MPV: 9.6 fL (ref 8.6–12.4)
Monocytes Absolute: 0.6 10*3/uL (ref 0.1–1.0)
Monocytes Relative: 9 % (ref 3–12)
NEUTROS PCT: 81 % — AB (ref 43–77)
Neutro Abs: 5.5 10*3/uL (ref 1.7–7.7)
PLATELETS: 263 10*3/uL (ref 150–400)
RBC: 4.17 MIL/uL (ref 3.87–5.11)
RDW: 14.3 % (ref 11.5–15.5)
WBC: 6.8 10*3/uL (ref 4.0–10.5)

## 2014-12-27 LAB — LIPID PANEL
CHOL/HDL RATIO: 1.4 ratio (ref <=5.0)
Cholesterol: 246 mg/dL — ABNORMAL HIGH (ref 125–200)
HDL: 171 mg/dL (ref >=46)
LDL Cholesterol: 67 mg/dL (ref <130)
TRIGLYCERIDES: 38 mg/dL (ref <150)
VLDL: 8 mg/dL (ref <30)

## 2014-12-27 LAB — MAGNESIUM: Magnesium: 1.7 mg/dL (ref 1.5–2.5)

## 2014-12-27 NOTE — Assessment & Plan Note (Signed)
I think this is arthritis in her hands she has pain medication as well as topical Aspercreme we did discuss her hand surgeon to possibly inject that finger but she wants to hold off on this

## 2014-12-27 NOTE — Patient Instructions (Signed)
Referral to eye doctor Continue aspercreme for you hand COntinue all other medications We will call with lab results Hydrocortisone apply to eyelids  F/U 6 months for PHYSICAL

## 2014-12-27 NOTE — Assessment & Plan Note (Addendum)
I think she may have more eye allergies. Trial of topical hydrocortisone on the eyelids she will continue the refresh [name] [name] in her back to ophthalmology

## 2014-12-27 NOTE — Assessment & Plan Note (Signed)
Status post bisphosphonate (RECLAST) infusion she did well with this she did have a low magnesium level at her labs this will be rechecked today

## 2014-12-27 NOTE — Progress Notes (Signed)
Patient ID: [name] [name], female   DOB: 1955-12-12, 59 y.o.   MRN: [id number]   Subjective:    Patient ID: [name] [name], female    DOB: 16-Apr-1956, 59 y.o.   MRN: [id number]  Patient presents for 4 month F/U  patient here to follow-up chronic medical problems. She still complains of dry eye and irritation of her eyelid she was seen by optometrist they gave her topical antibody cream but told her that it was not pinkeye. She was also given refresh eye drops which help some. Her optometrist has known left and she needs referral to a new eye doctor.  She continues to have pain with her arthritis she now has pain and stiffness in her right middle finger every morning she does use topical Aspercreme and this resolves most of the stiffness. She has had recurrence of her daily and cyst on the right side she is calling her hand surgeon so that this can be removed (  COPD she is doing fairly well she still remains smoke free she is using her inhalers as prescribed  Hyperlipidemia fatty liver disease she is due for repeat LFTs as well as cholesterol    Review Of Systems:  GEN- denies fatigue, fever, weight loss,weakness, recent illness HEENT-+ eye drainage, denies  change in vision, nasal discharge, CVS- denies chest pain, palpitations RESP- denies SOB, cough, wheeze ABD- denies N/V, change in stools, abd pain GU- denies dysuria, hematuria, dribbling, incontinence MSK- + joint pain, muscle aches, injury Neuro- denies headache, dizziness, syncope, seizure activity       Objective:    BP 134/78 mmHg  Pulse 92  Temp(Src) 98.6 F (37 C) (Oral)  Resp 20  Ht 5' (1.524 m)  Wt 102 lb (46.267 kg)  BMI 19.92 kg/m2  SpO2 94% GEN- NAD, alert and oriented x3 HEENT- PERRL, EOMI, non injected sclera, pink conjunctiva, MMM, oropharynx clear, eyelids no erythema or swelling seen  Neck- Supple,  CVS- RRR, no murmur RESP-CTAB MSK- right hand- able to make fist, Mild TTP MIP 3rd digit,mild swelling  noted, ligmantes in tact  EXT- No edema Pulses- Radial  2+        Assessment & Plan:      Problem List Items Addressed This Visit    Osteoporosis - Primary    Status post bisphosphonate (RECLAST) infusion she did well with this she did have a low magnesium level at her labs this will be rechecked today      Osteoarthritis    I think this is arthritis in her hands she has pain medication as well as topical Aspercreme we did discuss her hand surgeon to possibly inject that finger but she wants to hold off on this      Hyperlipidemia    Recheck lipids she is currently on Questran      Relevant Orders   Lipid panel   Essential hypertension    Well-controlled      Relevant Orders   CBC with Differential/Platelet   Comprehensive metabolic panel   Allergic dermatitis eyelid    I think she may have more eye allergies. Trial of topical hydrocortisone on the eyelids she will continue the refresh Glory Buff in her back to ophthalmology      Relevant Orders   Ambulatory referral to Ophthalmology    Other Visit Diagnoses    Low magnesium levels        Relevant Orders    Magnesium    Dry eye syndrome, bilateral  Relevant Orders    Ambulatory referral to Ophthalmology       Note: This dictation was prepared with Dragon dictation along with smaller phrase technology. Any transcriptional errors that result from this process are unintentional.

## 2014-12-27 NOTE — Assessment & Plan Note (Signed)
Well controlled 

## 2014-12-27 NOTE — Assessment & Plan Note (Signed)
Recheck lipids she is currently on Questran

## 2014-12-28 ENCOUNTER — Encounter: Payer: Self-pay | Admitting: Family Medicine

## 2014-12-29 ENCOUNTER — Encounter: Payer: Self-pay | Admitting: *Deleted

## 2015-01-19 ENCOUNTER — Other Ambulatory Visit: Payer: Self-pay | Admitting: Gastroenterology

## 2015-01-19 ENCOUNTER — Other Ambulatory Visit: Payer: Self-pay | Admitting: Family Medicine

## 2015-01-20 DIAGNOSIS — M546 Pain in thoracic spine: Secondary | ICD-10-CM | POA: Diagnosis not present

## 2015-01-20 DIAGNOSIS — M5032 Other cervical disc degeneration, mid-cervical region: Secondary | ICD-10-CM | POA: Diagnosis not present

## 2015-01-20 DIAGNOSIS — M9902 Segmental and somatic dysfunction of thoracic region: Secondary | ICD-10-CM | POA: Diagnosis not present

## 2015-01-20 DIAGNOSIS — M9901 Segmental and somatic dysfunction of cervical region: Secondary | ICD-10-CM | POA: Diagnosis not present

## 2015-01-20 NOTE — Telephone Encounter (Signed)
Medication refilled per protocol. 

## 2015-01-23 DIAGNOSIS — M546 Pain in thoracic spine: Secondary | ICD-10-CM | POA: Diagnosis not present

## 2015-01-23 DIAGNOSIS — M9902 Segmental and somatic dysfunction of thoracic region: Secondary | ICD-10-CM | POA: Diagnosis not present

## 2015-01-23 DIAGNOSIS — M9901 Segmental and somatic dysfunction of cervical region: Secondary | ICD-10-CM | POA: Diagnosis not present

## 2015-01-23 DIAGNOSIS — M5032 Other cervical disc degeneration, mid-cervical region: Secondary | ICD-10-CM | POA: Diagnosis not present

## 2015-01-25 DIAGNOSIS — M9901 Segmental and somatic dysfunction of cervical region: Secondary | ICD-10-CM | POA: Diagnosis not present

## 2015-01-25 DIAGNOSIS — M9902 Segmental and somatic dysfunction of thoracic region: Secondary | ICD-10-CM | POA: Diagnosis not present

## 2015-01-25 DIAGNOSIS — M546 Pain in thoracic spine: Secondary | ICD-10-CM | POA: Diagnosis not present

## 2015-01-25 DIAGNOSIS — M5032 Other cervical disc degeneration, mid-cervical region: Secondary | ICD-10-CM | POA: Diagnosis not present

## 2015-01-27 ENCOUNTER — Other Ambulatory Visit: Payer: Self-pay | Admitting: Family Medicine

## 2015-01-27 NOTE — Telephone Encounter (Signed)
ok 

## 2015-01-27 NOTE — Telephone Encounter (Signed)
Ok to refill??  Last office visit 12/27/2014.  Last refill 10/27/2014, #2 refills.

## 2015-01-27 NOTE — Telephone Encounter (Signed)
Medication called to pharmacy. 

## 2015-01-30 DIAGNOSIS — K50912 Crohn's disease, unspecified, with intestinal obstruction: Secondary | ICD-10-CM | POA: Diagnosis not present

## 2015-01-30 DIAGNOSIS — M545 Low back pain: Secondary | ICD-10-CM | POA: Diagnosis not present

## 2015-01-30 DIAGNOSIS — M542 Cervicalgia: Secondary | ICD-10-CM | POA: Diagnosis not present

## 2015-01-30 DIAGNOSIS — Z79899 Other long term (current) drug therapy: Secondary | ICD-10-CM | POA: Diagnosis not present

## 2015-02-01 DIAGNOSIS — M546 Pain in thoracic spine: Secondary | ICD-10-CM | POA: Diagnosis not present

## 2015-02-01 DIAGNOSIS — M5032 Other cervical disc degeneration, mid-cervical region: Secondary | ICD-10-CM | POA: Diagnosis not present

## 2015-02-01 DIAGNOSIS — M9902 Segmental and somatic dysfunction of thoracic region: Secondary | ICD-10-CM | POA: Diagnosis not present

## 2015-02-01 DIAGNOSIS — M9901 Segmental and somatic dysfunction of cervical region: Secondary | ICD-10-CM | POA: Diagnosis not present

## 2015-02-07 DIAGNOSIS — H25013 Cortical age-related cataract, bilateral: Secondary | ICD-10-CM | POA: Diagnosis not present

## 2015-02-07 DIAGNOSIS — H2513 Age-related nuclear cataract, bilateral: Secondary | ICD-10-CM | POA: Diagnosis not present

## 2015-02-07 DIAGNOSIS — H04123 Dry eye syndrome of bilateral lacrimal glands: Secondary | ICD-10-CM | POA: Diagnosis not present

## 2015-02-09 ENCOUNTER — Telehealth: Payer: Self-pay | Admitting: *Deleted

## 2015-02-09 DIAGNOSIS — R9389 Abnormal findings on diagnostic imaging of other specified body structures: Secondary | ICD-10-CM

## 2015-02-09 NOTE — Telephone Encounter (Signed)
-----   Message from Tanda Rockers, MD sent at 05/28/2014  7:33 AM EST ----- Ct chest no contrast due for mpns

## 2015-02-09 NOTE — Telephone Encounter (Signed)
Spoke with the pt and reminded her CT Chest due in Nov 2016  Pt states she prefers this to be done at [place] was sent to [facility]

## 2015-03-07 DIAGNOSIS — H25013 Cortical age-related cataract, bilateral: Secondary | ICD-10-CM | POA: Diagnosis not present

## 2015-03-07 DIAGNOSIS — H2513 Age-related nuclear cataract, bilateral: Secondary | ICD-10-CM | POA: Diagnosis not present

## 2015-03-07 DIAGNOSIS — H04123 Dry eye syndrome of bilateral lacrimal glands: Secondary | ICD-10-CM | POA: Diagnosis not present

## 2015-03-19 ENCOUNTER — Other Ambulatory Visit: Payer: Self-pay | Admitting: Family Medicine

## 2015-03-20 ENCOUNTER — Other Ambulatory Visit: Payer: Self-pay | Admitting: Internal Medicine

## 2015-03-20 ENCOUNTER — Ambulatory Visit (HOSPITAL_COMMUNITY)
Admission: RE | Admit: 2015-03-20 | Discharge: 2015-03-20 | Disposition: A | Discharge status: Home / Self Care | Payer: Medicare Other | Source: Ambulatory Visit | Attending: Internal Medicine | Admitting: Internal Medicine

## 2015-03-20 ENCOUNTER — Other Ambulatory Visit: Payer: Self-pay | Admitting: Family Medicine

## 2015-03-20 DIAGNOSIS — R911 Solitary pulmonary nodule: Secondary | ICD-10-CM | POA: Insufficient documentation

## 2015-03-20 DIAGNOSIS — R9389 Abnormal findings on diagnostic imaging of other specified body structures: Secondary | ICD-10-CM

## 2015-03-20 NOTE — Telephone Encounter (Signed)
Refill appropriate and filled per protocol. 

## 2015-03-21 NOTE — Progress Notes (Signed)
Quick Note:  lmtcb for pt. ______ 

## 2015-03-22 NOTE — Progress Notes (Signed)
Quick Note:  LMTCB ______ 

## 2015-03-23 NOTE — Progress Notes (Signed)
Quick Note:  Contacted pt with results per MW Pt expressed understanding, nothing further needed ______

## 2015-04-09 ENCOUNTER — Other Ambulatory Visit: Payer: Self-pay | Admitting: Internal Medicine

## 2015-04-19 ENCOUNTER — Telehealth: Payer: Self-pay | Admitting: Gastroenterology

## 2015-04-19 NOTE — Telephone Encounter (Signed)
Letter mailed to pt to call.

## 2015-04-19 NOTE — Telephone Encounter (Signed)
January RECALL FOR TCS

## 2015-04-25 ENCOUNTER — Other Ambulatory Visit: Payer: Self-pay | Admitting: Family Medicine

## 2015-04-25 DIAGNOSIS — J449 Chronic obstructive pulmonary disease, unspecified: Secondary | ICD-10-CM | POA: Diagnosis not present

## 2015-04-25 DIAGNOSIS — K50912 Crohn's disease, unspecified, with intestinal obstruction: Secondary | ICD-10-CM | POA: Diagnosis not present

## 2015-04-25 DIAGNOSIS — M545 Low back pain: Secondary | ICD-10-CM | POA: Diagnosis not present

## 2015-04-25 DIAGNOSIS — M79673 Pain in unspecified foot: Secondary | ICD-10-CM | POA: Diagnosis not present

## 2015-04-25 DIAGNOSIS — K589 Irritable bowel syndrome without diarrhea: Secondary | ICD-10-CM | POA: Diagnosis not present

## 2015-04-25 DIAGNOSIS — Z79899 Other long term (current) drug therapy: Secondary | ICD-10-CM | POA: Diagnosis not present

## 2015-04-25 DIAGNOSIS — M542 Cervicalgia: Secondary | ICD-10-CM | POA: Diagnosis not present

## 2015-04-25 NOTE — Telephone Encounter (Signed)
Ok to refill??  Last office visit 12/27/2014.  Last refill 01/27/2015, #2 refills.

## 2015-04-26 NOTE — Telephone Encounter (Signed)
Okay to refill? 

## 2015-04-26 NOTE — Telephone Encounter (Signed)
Medication called to pharmacy. 

## 2015-05-09 ENCOUNTER — Other Ambulatory Visit: Payer: Self-pay | Admitting: Family Medicine

## 2015-05-09 NOTE — Telephone Encounter (Signed)
Refill appropriate and filled per protocol. 

## 2015-05-17 ENCOUNTER — Other Ambulatory Visit: Payer: Self-pay | Admitting: *Deleted

## 2015-05-17 MED ORDER — LOSARTAN POTASSIUM-HCTZ 50-12.5 MG PO TABS: 1.0000 | ORAL_TABLET | Freq: Every day | ORAL | Status: DC

## 2015-05-17 NOTE — Telephone Encounter (Signed)
Received fax requesting refill on Losartan/ HCTZ.   Refill appropriate and filled per protocol.

## 2015-05-25 ENCOUNTER — Ambulatory Visit (INDEPENDENT_AMBULATORY_CARE_PROVIDER_SITE_OTHER): Payer: Medicare Other | Admitting: Gastroenterology

## 2015-05-25 ENCOUNTER — Encounter: Payer: Self-pay | Admitting: Gastroenterology

## 2015-05-25 VITALS — BP 116/77 | HR 92 | Temp 96.6°F | Ht 60.0 in | Wt 104.0 lb

## 2015-05-25 DIAGNOSIS — R740 Nonspecific elevation of levels of transaminase and lactic acid dehydrogenase [LDH]: Secondary | ICD-10-CM

## 2015-05-25 DIAGNOSIS — R7401 Elevation of levels of liver transaminase levels: Secondary | ICD-10-CM

## 2015-05-25 DIAGNOSIS — R1310 Dysphagia, unspecified: Secondary | ICD-10-CM

## 2015-05-25 DIAGNOSIS — K50019 Crohn's disease of small intestine with unspecified complications: Secondary | ICD-10-CM

## 2015-05-25 NOTE — Progress Notes (Signed)
CC'ED TO PCP 

## 2015-05-25 NOTE — Patient Instructions (Addendum)
YOU NEED TO CHECK YOUR LIVER ENZYMES IN FEB 2017.  DRINK WATER TO KEEP YOUR URINE LIGHT YELLOW.  FOLLOW A LOW FAT DIET.   When eating dairy TAKE LACTASE PILLS, EAT LACTOSE FREE ICE CREAM, OR DRINK LACTAID MILK.  CONTINUE QUESTRAN.  FOLLOW UP IN 6 MOS.

## 2015-05-25 NOTE — Progress Notes (Signed)
Subjective:    Patient ID: [name] [name], female    DOB: August 05, 1955, 60 y.o.   MRN: [id number]  [name], MD  HPI BEEN PRETTY GOOD. HAS DRY EYES. HAS PAIN IN RIGHT SIDE OF ABDOMEN. HAD BAD ABD PAIN AFTER 4 EGGS. UNDER STRESS DUE TO FRIEND HAVING RELATIONSHIP PROBLEMS. BMs: IRRITABLE, ONCE A DAY AND MAY BE 3-4 TIMES A DAYS-GOES FROM CONSTIPATION(#2) TO EXPLOSIVE WATERY DIARRHEA(#6-7). CHEST PAIN WHEN SHE TAKES HER EYE MEDICINE AND GAGS.COUGHING UP YELLOW GUNK. SORT OF SMOKING. TRIES NOT TO DO INHALER. HEARTBURN MANAGED WITH TUMS-mostly controlled UNLESS SHE EATS CHILI. MAY HAVE A BLOODY NOSE AFTER BLOWING HER NOSE:1X/WEEK.Marland Kitchen EATS SOFT FOODS DUE TO SWALLOWING PROBLEMS(ORANGES). MAY GAG OR [place] AND THEN SHE SPITS UP. Rare nausea. Body aches last week. PT DENIES FEVER, CHILLS, HEMATOCHEZIA, HEMATEMESIS, melena, OR heartburn or indigestion.  Past Medical History  Diagnosis Date  . Elevated liver enzymes 2009: BMI 24 ?Etoh ALT 94, AST 51,  NEG ANA, qIGs& ASMA    MAY 2012 AST 52 ALT 38  . Anastomotic ulcer JAN 2009  . Esophageal stricture 2009  . Diarrhea MULITIFACTORIAL    IBS, LACTOSE INTOLERANCE, SBBO, BILE-SALT  . Inflammatory bowel disease 2006 CD    SURGICAL REMISSION  . LBP (low back pain)   . Migraine   . Asthma   . Allergic rhinitis   . CTS (carpal tunnel syndrome)   . Hyperlipemia   . Anxiety   . Hemorrhoid   . Crohn disease ([place])   . BMI (body mass index) 20.0-29.9 2009 121 lbs  . 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 Shortness of breath     exertion and humidity  . GERD (gastroesophageal reflux disease)   . Hypertension   . Sleep apnea     Stop Bang Cock score of 4  . PONV (postoperative nausea and vomiting)   . Crohn disease [facility])    Past Surgical History  Procedure Laterality Date  . Hemicolectomy  RIGHT 2006  . Hernia repair    . Carpal tunnel release  LEFT  . Lumbar disc surgery    . Vocal cord surgery  Sep 20, 2010    precancerous areas removed  .  Esophagogastroduodenoscopy  02/07/09    mild gastritis  . Colonoscopy  JAN 2009 DIARRHEA, ABD PAIN, BRBPR    ANASTOMOTIC ULCER(3MM), IH ZJ:IRCVEL ULCER, NL COLON bX  . Upper gastrointestinal endoscopy  JAN 2009 ABD PAIN    GASTRITIS, ESO RING  . Upper gastrointestinal endoscopy  OCT 2010 ABD PAIN, DYSPHAGIA    DIL 15MM, GASTRITIS, NL DUODENUM  . Upper gastrointestinal endoscopy  OCT 2010 DYSPHAGIA    DIL 17 MM, GASTRITIS, NL DUODENUM  . Dilation and curettage of uterus    . Bilateral salpingoophorectomy    . Wisdom tooth extraction      pin placement due to broken jaw  . Colon surgery    . Back surgery    . Esophageal dilation  12/24/2011    stricture in the distal esophagus/ Moderate gastritis/ MILD Duodenitis  . Esophageal biopsy  12/24/2011      . Esophagogastroduodenoscopy (egd) with propofol/DIL N/A 06/30/2012    SLF: 1. No definite stricture appreciated 2. small hiatal hernia 3. Gastritis  .         Marland Kitchen          Allergies  Allergen Reactions  . Lovastatin Other (See Comments)    Chest pain  . Nexium [Esomeprazole Magnesium] Diarrhea  Abdominal pain   Current Outpatient Prescriptions  Medication Sig Dispense Refill  . amLODipine (NORVASC) 5 MG tablet TAKE ONE TABLET BY MOUTH ONCE DAILY    . calcium carbonate (TUMS - DOSED IN MG ELEMENTAL CALCIUM) 500 MG chewable tablet Chew 1-2 tablets by mouth 2 (two) times daily as needed for indigestion or heartburn.    . cholestyramine (QUESTRAN) 4 GM/DOSE powder DISSOLVE AND TAKE TWO GRAMS BY MOUTH TWICE DAILY WITH A MEAL--  DO  NOT  TAKE  WITHIN  2  HOURS  OF  OTHER  MEDICATIONS.    Marland Kitchen cyclobenzaprine (FLEXERIL) 5 MG tablet Take 5 mg by mouth 2 (two) times daily as needed for muscle spasms.     . diazepam (VALIUM) 5 MG tablet TAKE ONE TABLET BY MOUTH EVERY 12 HRS PRN    . fluorometholone (FML) 0.1 % ophthalmic ointment 1 application 4 (four) times daily.    . fluticasone (CUTIVATE) 0.05 % cream     . fluticasone (FLONASE) 50 MCG/ACT  nasal spray Place 2 sprays into both nostrils daily as needed for allergies or rhinitis.    Marland Kitchen gabapentin (NEURONTIN) 300 MG capsule Take 300 mg by mouth 2 (two) times daily.    Marland Kitchen HYDROcodone-acetaminophen (NORCO) 10-325 MG per tablet Take 1 tablet by mouth every 6 (six) hours as needed for moderate pain.    Marland Kitchen ketoconazole (NIZORAL) 2 % shampoo Apply 1 application topically 2 (two) times a week. For 4 weeks1    . Lifitegrast (XIIDRA) 5 % SOLN Apply to eye 2 (two) times daily.    Marland Kitchen losartan-hydrochlorothiazide (HYZAAR) 50-12.5 MG tablet Take 1 tablet by mouth daily.    Marland Kitchen loteprednol (LOTEMAX) 0.5 % ophthalmic suspension Place 2 drops into both eyes 2 (two) times daily.    . mupirocin ointment (BACTROBAN) 2 % Apply 1 application topically 2 (two) times daily.    Marland Kitchen nystatin (MYCOSTATIN) 100000 UNIT/ML suspension TAKE 5 ML BY MOUTH 4 TIMES DAILY JUST AS NEEDED    . SYMBICORT 80-4.5 MCG/ACT inhaler INHALE TWO PUFFS BY MOUTH TWICE DAILY    . VENTOLIN HFA 108 (90 BASE) MCG/ACT inhaler INHALE TWO PUFFS BY MOUTH EVERY 4 HOURS AS NEEDED FOR WHEEZING     Review of Systems PER HPI OTHERWISE ALL SYSTEMS ARE NEGATIVE.    Objective:   Physical Exam  Constitutional: She is oriented to person, place, and time. She appears well-developed and well-nourished. No distress.  HENT:  Head: Normocephalic and atraumatic.  Mouth/Throat: Oropharynx is clear and moist. No oropharyngeal exudate.  Eyes: Pupils are equal, round, and reactive to light. No scleral icterus.  Neck: Normal range of motion. Neck supple.  Cardiovascular: Normal rate, regular rhythm and normal heart sounds.   Pulmonary/Chest: Effort normal and breath sounds normal. No respiratory distress.  Abdominal: Soft. Bowel sounds are normal. She exhibits no distension. There is tenderness. There is no rebound and no guarding.  MILD TTP IN THE EPIGASTRIUM & BUQs   Musculoskeletal: She exhibits no edema.  Lymphadenopathy:    She has no cervical  adenopathy.  Neurological: She is alert and oriented to person, place, and time.  NO FOCAL DEFICITS  Psychiatric: She has a normal mood and affect.  Vitals reviewed.     Assessment & Plan:

## 2015-05-25 NOTE — Assessment & Plan Note (Signed)
Last HFP AUG 2016 MILDLY ELEVATED AST 37, NL ALT.  REPEAT HFP IN FEB 2017.

## 2015-05-25 NOTE — Assessment & Plan Note (Addendum)
SURGICAL REMISSION ACHIEVED. SYMPTOMS FAIRLY WELL CONTROLLED. EXACERBATED BY LACTOSE INTOLERANCE AND BILE SALT INDUCED DIARRHEA  CONTINUE TO MONITOR SYMPTOMS. DRINK WATER TO KEEP YOUR URINE LIGHT YELLOW. FOLLOW A LOW FAT DIET.  TAKE LACTASE PILLS, EAT LACTOSE FREE ICE CREAM, OR DRINK LACTAID MILK. CONTINUE QUESTRAN.  FOLLOW UP IN 6 MOS.

## 2015-05-25 NOTE — Assessment & Plan Note (Signed)
SYMPTOMS FAIRLY WELL CONTROLLED.  CONTINUE TO MONITOR SYMPTOMS. 

## 2015-05-26 NOTE — Progress Notes (Signed)
ON RECALL  °

## 2015-06-01 ENCOUNTER — Other Ambulatory Visit: Payer: Self-pay | Admitting: Family Medicine

## 2015-06-01 NOTE — Telephone Encounter (Signed)
Refill appropriate and filled per protocol. 

## 2015-06-22 ENCOUNTER — Other Ambulatory Visit: Payer: Self-pay | Admitting: Physician Assistant

## 2015-06-22 ENCOUNTER — Ambulatory Visit (INDEPENDENT_AMBULATORY_CARE_PROVIDER_SITE_OTHER): Payer: Medicare Other | Admitting: Physician Assistant

## 2015-06-22 ENCOUNTER — Encounter: Payer: Self-pay | Admitting: Physician Assistant

## 2015-06-22 VITALS — BP 108/66 | HR 76 | Temp 98.5°F | Resp 20 | Wt 104.0 lb

## 2015-06-22 DIAGNOSIS — R509 Fever, unspecified: Secondary | ICD-10-CM | POA: Diagnosis not present

## 2015-06-22 DIAGNOSIS — J029 Acute pharyngitis, unspecified: Secondary | ICD-10-CM | POA: Diagnosis not present

## 2015-06-22 DIAGNOSIS — B9689 Other specified bacterial agents as the cause of diseases classified elsewhere: Secondary | ICD-10-CM

## 2015-06-22 DIAGNOSIS — J988 Other specified respiratory disorders: Secondary | ICD-10-CM | POA: Diagnosis not present

## 2015-06-22 LAB — INFLUENZA A AND B AG, IMMUNOASSAY
INFLUENZA A ANTIGEN: NOT DETECTED
INFLUENZA B ANTIGEN: NOT DETECTED

## 2015-06-22 LAB — STREP GROUP A AG, W/REFLEX TO CULT: STREGTOCOCCUS GROUP A AG SCREEN: NOT DETECTED

## 2015-06-22 MED ORDER — AZITHROMYCIN 250 MG PO TABS: ORAL_TABLET | ORAL | Status: DC

## 2015-06-22 NOTE — Progress Notes (Signed)
Patient ID: [name] [name] MRN: [id number], DOB: 12-11-1955, 60 y.o. Date of Encounter: 06/22/2015, 1:31 PM    Chief Complaint:  Chief Complaint  Patient presents with  . sick x couple weeks    sore throat, cough, headache,body aches and chills     HPI: 60 y.o. year old female presents with above. Says that her throat is still sore. Says that at times her nose will run a lot. Says that at times she coughs up a lot of phlegm. Yellow- brown. No known fever.     Home Meds:   Outpatient Prescriptions Prior to Visit  Medication Sig Dispense Refill  . amLODipine (NORVASC) 5 MG tablet TAKE ONE TABLET BY MOUTH ONCE DAILY 90 tablet 0  . calcium carbonate (TUMS - DOSED IN MG ELEMENTAL CALCIUM) 500 MG chewable tablet Chew 1-2 tablets by mouth 2 (two) times daily as needed for indigestion or heartburn.    . cholestyramine (QUESTRAN) 4 GM/DOSE powder DISSOLVE AND TAKE TWO GRAMS BY MOUTH TWICE DAILY WITH A MEAL--  DO  NOT  TAKE  WITHIN  2  HOURS  OF  OTHER  MEDICATIONS. 378 g 3  . cyclobenzaprine (FLEXERIL) 5 MG tablet Take 5 mg by mouth 2 (two) times daily as needed for muscle spasms.     . diazepam (VALIUM) 5 MG tablet TAKE ONE TABLET BY MOUTH EVERY 12 HOURS AS NEEDED FOR ANXIETY **REFILLS MUST BE 30 DAYS APART** 30 tablet 2  . fluorometholone (FML) 0.1 % ophthalmic ointment 1 application 4 (four) times daily.    . fluticasone (CUTIVATE) 0.05 % cream     . fluticasone (FLONASE) 50 MCG/ACT nasal spray Place 2 sprays into both nostrils daily as needed for allergies or rhinitis. 16 g 6  . gabapentin (NEURONTIN) 300 MG capsule Take 300 mg by mouth 2 (two) times daily.    Marland Kitchen HYDROcodone-acetaminophen (NORCO) 10-325 MG per tablet Take 1 tablet by mouth every 6 (six) hours as needed for moderate pain.    Marland Kitchen ketoconazole (NIZORAL) 2 % shampoo Apply 1 application topically 2 (two) times a week. For 4 weeks1 120 mL 1  . Lifitegrast (XIIDRA) 5 % SOLN Apply to eye 2 (two) times daily.    Marland Kitchen  losartan-hydrochlorothiazide (HYZAAR) 50-12.5 MG tablet Take 1 tablet by mouth daily. 30 tablet 1  . loteprednol (LOTEMAX) 0.5 % ophthalmic suspension Place 2 drops into both eyes 2 (two) times daily.    . mupirocin ointment (BACTROBAN) 2 % Apply 1 application topically 2 (two) times daily. 30 g 3  . nystatin (MYCOSTATIN) 100000 UNIT/ML suspension TAKE 5 ML BY MOUTH 4 TIMES DAILY JUST AS NEEDED 180 mL 1  . SYMBICORT 80-4.5 MCG/ACT inhaler INHALE TWO PUFFS BY MOUTH TWICE DAILY 7 g 0  . VENTOLIN HFA 108 (90 BASE) MCG/ACT inhaler INHALE TWO PUFFS BY MOUTH EVERY 4 HOURS AS NEEDED FOR WHEEZING 18 g 6   No facility-administered medications prior to visit.    Allergies:  Allergies  Allergen Reactions  . Lovastatin Other (See Comments)    Chest pain  . Nexium [Esomeprazole Magnesium] Diarrhea    Abdominal pain      Review of Systems: See HPI for pertinent ROS. All other ROS negative.    Physical Exam: Blood pressure 108/66, pulse 76, temperature 98.5 F (36.9 C), temperature source Oral, resp. rate 20, weight 104 lb (47.174 kg)., Body mass index is 20.31 kg/(m^2). General:  WF. Appears in no acute distress. HEENT: Normocephalic, atraumatic, eyes without discharge,  sclera non-icteric, nares are without discharge. Bilateral auditory canals clear, TM's are without perforation, pearly grey and translucent with reflective cone of light bilaterally. Oral cavity moist, posterior pharynx without exudate, erythema, peritonsillar abscess.  Neck: Supple. No thyromegaly. No lymphadenopathy. Lungs: Clear bilaterally to auscultation without wheezes, rales, or rhonchi. Breathing is unlabored. Heart: Regular rhythm. No murmurs, rubs, or gallops. Msk:  Strength and tone normal for age. Extremities/Skin: Warm and dry. Neuro: Alert and oriented X 3. Moves all extremities spontaneously. Gait is normal. CNII-XII grossly in tact. Psych:  Responds to questions appropriately with a normal affect.   Results for  orders placed or performed in visit on 06/22/15  STREP GROUP A AG, W/REFLEX TO CULT  Result Value Ref Range   SOURCE THROAT    STREGTOCOCCUS GROUP A AG SCREEN Not Detected      ASSESSMENT AND PLAN:  60 y.o. year old female with  1. Bacterial respiratory infection She is to take antibiotic as directed. Use expectorant. F/U if symptoms do not resolve within 1 week after completion of antibiotic. - azithromycin (ZITHROMAX) 250 MG tablet; Day 1: Take 2 daily. Days 2-5: Take 1 daily.  Dispense: 6 tablet; Refill: 0  2. Sorethroat - STREP GROUP A AG, W/REFLEX TO CULT - Influenza a and b  3. Fever and chills - STREP GROUP A AG, W/REFLEX TO CULT - Influenza a and b   Signed, [address] [place], [place], [facility] 06/22/2015 1:31 PM

## 2015-06-29 DIAGNOSIS — R634 Abnormal weight loss: Secondary | ICD-10-CM | POA: Diagnosis not present

## 2015-06-29 DIAGNOSIS — K210 Gastro-esophageal reflux disease with esophagitis: Secondary | ICD-10-CM | POA: Diagnosis not present

## 2015-06-29 DIAGNOSIS — J441 Chronic obstructive pulmonary disease with (acute) exacerbation: Secondary | ICD-10-CM | POA: Diagnosis not present

## 2015-06-29 DIAGNOSIS — I10 Essential (primary) hypertension: Secondary | ICD-10-CM | POA: Diagnosis not present

## 2015-07-04 ENCOUNTER — Encounter: Payer: Self-pay | Admitting: Family Medicine

## 2015-07-04 ENCOUNTER — Ambulatory Visit (INDEPENDENT_AMBULATORY_CARE_PROVIDER_SITE_OTHER): Payer: Medicare Other | Admitting: Family Medicine

## 2015-07-04 VITALS — BP 124/62 | HR 80 | Temp 98.2°F | Resp 18 | Ht 60.0 in | Wt 101.0 lb

## 2015-07-04 DIAGNOSIS — B370 Candidal stomatitis: Secondary | ICD-10-CM

## 2015-07-04 DIAGNOSIS — Z0000 Encounter for general adult medical examination without abnormal findings: Secondary | ICD-10-CM

## 2015-07-04 DIAGNOSIS — E785 Hyperlipidemia, unspecified: Secondary | ICD-10-CM | POA: Diagnosis not present

## 2015-07-04 DIAGNOSIS — M810 Age-related osteoporosis without current pathological fracture: Secondary | ICD-10-CM

## 2015-07-04 DIAGNOSIS — R634 Abnormal weight loss: Secondary | ICD-10-CM | POA: Diagnosis not present

## 2015-07-04 DIAGNOSIS — Z124 Encounter for screening for malignant neoplasm of cervix: Secondary | ICD-10-CM | POA: Diagnosis not present

## 2015-07-04 DIAGNOSIS — G8929 Other chronic pain: Secondary | ICD-10-CM

## 2015-07-04 DIAGNOSIS — R7401 Elevation of levels of liver transaminase levels: Secondary | ICD-10-CM

## 2015-07-04 DIAGNOSIS — R7402 Elevation of levels of lactic acid dehydrogenase (LDH): Secondary | ICD-10-CM

## 2015-07-04 DIAGNOSIS — I10 Essential (primary) hypertension: Secondary | ICD-10-CM | POA: Diagnosis not present

## 2015-07-04 DIAGNOSIS — R740 Nonspecific elevation of levels of transaminase and lactic acid dehydrogenase [LDH]: Secondary | ICD-10-CM | POA: Diagnosis not present

## 2015-07-04 MED ORDER — LOSARTAN POTASSIUM-HCTZ 50-12.5 MG PO TABS: 1.0000 | ORAL_TABLET | Freq: Every day | ORAL | Status: DC

## 2015-07-04 MED ORDER — DIAZEPAM 5 MG PO TABS: ORAL_TABLET | ORAL | Status: DC

## 2015-07-04 MED ORDER — MIRTAZAPINE 15 MG PO TABS: 15.0000 mg | ORAL_TABLET | Freq: Every day | ORAL | Status: DC

## 2015-07-04 NOTE — Assessment & Plan Note (Signed)
Fluctuating weight although she is very petite and short individual. She is using insurer and other protein supplements. We'll try Remeron to see if this helps. Her Pap smear was done her mammogram is due a couple months. Her colonoscopy is up today she had recent CT of her chest no sign of any cancer

## 2015-07-04 NOTE — Assessment & Plan Note (Signed)
Plan for Bone density in the summer

## 2015-07-04 NOTE — Assessment & Plan Note (Signed)
Nystatin oral solution

## 2015-07-04 NOTE — Assessment & Plan Note (Signed)
On Questrain, has chronic elevated LFT  check fasting labs

## 2015-07-04 NOTE — Patient Instructions (Addendum)
Complete antibiotics Use thrush medication Get your labs done fasting  in [place]  F/U 2 MONTHS FOR WEIGHT

## 2015-07-04 NOTE — Progress Notes (Signed)
Patient ID: [name] [name], female   DOB: 20-Apr-1956, 60 y.o.   MRN: [id number] Subjective:   Patient presents for Medicare Annual/Subsequent preventive examination.  Patient here for a wellness exam. She has been recent treated for bronchitis exacerbation. She is being followed by pulmonary Dr. Luan Pulling. After her azithromycin did not work their office called in Augmentin and prednisone for cough syrup which she is currently on. She does have some thrush which she tends to get after taking steroids. She continues to have ear pressure sinus pressure and sore throat soreness around her chest from coughing.  At the end of the visit she states she was concerned about her weight she is eating and she is trying and sure in increasing her protein Eustace Moore she continues to gain weight and then lose weight.  Follows with pain clinic- Dr. Merlene Laughter   Review Past Medical/Family/Social: Per EMR   Risk Factors  Current exercise habits: Walks Dietary issues discussed: Yes  Cardiac risk factors: Hyperlipidemia, HTN  Depression Screen  (Note: if answer to either of the following is "Yes", a more complete depression screening is indicated)  Over the past two weeks, have you felt down, depressed or hopeless? No Over the past two weeks, have you felt little interest or pleasure in doing things? No Have you lost interest or pleasure in daily life? No Do you often feel hopeless? No Do you cry easily over simple problems? No   Activities of Daily Living  In your present state of health, do you have any difficulty performing the following activities?:  Driving? No  Managing money? No  Feeding yourself? No  Getting from bed to chair? No  Climbing a flight of stairs? No  Preparing food and eating?: No  Bathing or showering? No  Getting dressed: No  Getting to the toilet? No  Using the toilet:No  Moving around from place to place: No  In the past year have you fallen or had a near fall?:No  Are you sexually  active? No  Do you have more than one partner? No   Hearing Difficulties: No  Do you often ask people to speak up or repeat themselves? No  Do you experience ringing or noises in your ears? No Do you have difficulty understanding soft or whispered voices? No  Do you feel that you have a problem with memory? No Do you often misplace items? No  Do you feel safe at home? Yes  Cognitive Testing  Alert? Yes Normal Appearance?Yes  Oriented to person? Yes Place? Yes  Time? Yes  Recall of three objects? Yes  Can perform simple calculations? Yes  Displays appropriate judgment?Yes  Can read the correct time from a watch face?Yes   List the Names of Other Physician/Practitioners you currently use:  Dr. Oneida Alar, Dr. Merlene Laughter, Dr. Luan Pulling Screening Tests / Date Colonoscopy   UTD                  Zostavax age 67 Mammogram UTD Influenza Vaccine  -  Tetanus/tdap  utd   ROS: GEN- denies fatigue, fever, weight loss,weakness, recent illness HEENT- denies eye drainage, change in vision, +nasal discharge, CVS- denies chest pain, palpitations RESP- denies SOB, +cough, wheeze ABD- denies N/V, change in stools, abd pain GU- denies dysuria, hematuria, dribbling, incontinence MSK- + joint pain, muscle aches, injury Neuro- denies headache, dizziness, syncope, seizure activity  PHYSICAL: GEN- NAD, alert and oriented x3 HEENT- PERRL, EOMI, non injected sclera, pink conjunctiva, MMM, oropharynx- +thrush  TM clear  no effusion, clear rhinorrhea,  Neck- Supple, no thryomegaly Breast- normal symmetry, no nipple inversion,no nipple drainage, no nodules or lumps felt Nodes- no axillary nodes CVS- RRR, no murmur RESP-few scattered wheeze, normal WOB ABD-NABS,soft,NT,ND GU- normal external genitalia, vaginal mucosa pink and moist, cervix visualized -atrophy  no blood form os, minimal thin clear discharge, no CMT, no ovarian masses, uterus normal size EXT- No edema Pulses- Radial 2+    Assessment:     Annual wellness medicare exam   Plan:    During the course of the visit the patient was educated and counseled about appropriate screening and preventive services including:  Screening mammography  PAP Smear done   Diet review for nutrition referral? Yes ____ Not Indicated __x__  Patient Instructions (the written plan) was given to the patient.  Medicare Attestation  I have personally reviewed:  The patient's medical and social history  Their use of alcohol, tobacco or illicit drugs  Their current medications and supplements  The patient's functional ability including ADLs,fall risks, home safety risks, cognitive, and hearing and visual impairment  Diet and physical activities  Evidence for depression or mood disorders  The patient's weight, height, BMI, and visual acuity have been recorded in the chart. I have made referrals, counseling, and provided education to the patient based on review of the above and I have provided the patient with a written personalized care plan for preventive services.

## 2015-07-05 ENCOUNTER — Encounter: Payer: Self-pay | Admitting: *Deleted

## 2015-07-05 DIAGNOSIS — I10 Essential (primary) hypertension: Secondary | ICD-10-CM | POA: Diagnosis not present

## 2015-07-05 DIAGNOSIS — E785 Hyperlipidemia, unspecified: Secondary | ICD-10-CM | POA: Diagnosis not present

## 2015-07-05 LAB — CBC WITH DIFFERENTIAL/PLATELET
BASOS ABS: 0.1 10*3/uL (ref 0.0–0.1)
Basophils Relative: 1 % (ref 0–1)
Eosinophils Absolute: 0.1 10*3/uL (ref 0.0–0.7)
Eosinophils Relative: 1 % (ref 0–5)
HCT: 44.3 % (ref 36.0–46.0)
Hemoglobin: 15.3 g/dL — ABNORMAL HIGH (ref 12.0–15.0)
LYMPHS ABS: 1.9 10*3/uL (ref 0.7–4.0)
Lymphocytes Relative: 29 % (ref 12–46)
MCH: 34.6 pg — AB (ref 26.0–34.0)
MCHC: 34.5 g/dL (ref 30.0–36.0)
MCV: 100.2 fL — AB (ref 78.0–100.0)
MONOS PCT: 10 % (ref 3–12)
MPV: 9.5 fL (ref 8.6–12.4)
Monocytes Absolute: 0.7 10*3/uL (ref 0.1–1.0)
NEUTROS ABS: 3.8 10*3/uL (ref 1.7–7.7)
NEUTROS PCT: 59 % (ref 43–77)
Platelets: 549 10*3/uL — ABNORMAL HIGH (ref 150–400)
RBC: 4.42 MIL/uL (ref 3.87–5.11)
RDW: 12.9 % (ref 11.5–15.5)
WBC: 6.5 10*3/uL (ref 4.0–10.5)

## 2015-07-05 LAB — COMPREHENSIVE METABOLIC PANEL
ALBUMIN: 4.2 g/dL (ref 3.6–5.1)
ALK PHOS: 101 U/L (ref 33–130)
ALT: 36 U/L — AB (ref 6–29)
AST: 25 U/L (ref 10–35)
BUN: 12 mg/dL (ref 7–25)
CHLORIDE: 102 mmol/L (ref 98–110)
CO2: 27 mmol/L (ref 20–31)
Calcium: 9.3 mg/dL (ref 8.6–10.4)
Creat: 0.58 mg/dL (ref 0.50–1.05)
Glucose, Bld: 84 mg/dL (ref 70–99)
POTASSIUM: 3.8 mmol/L (ref 3.5–5.3)
Sodium: 139 mmol/L (ref 135–146)
TOTAL PROTEIN: 7 g/dL (ref 6.1–8.1)
Total Bilirubin: 0.6 mg/dL (ref 0.2–1.2)

## 2015-07-05 LAB — LIPID PANEL
Cholesterol: 154 mg/dL (ref 125–200)
HDL: 87 mg/dL (ref >=46)
LDL Cholesterol: 39 mg/dL (ref <130)
TRIGLYCERIDES: 142 mg/dL (ref <150)
Total CHOL/HDL Ratio: 1.8 Ratio (ref <=5.0)
VLDL: 28 mg/dL (ref <30)

## 2015-07-05 LAB — PAP THINPREP ASCUS RFLX HPV RFLX TYPE

## 2015-07-07 ENCOUNTER — Other Ambulatory Visit: Payer: Self-pay | Admitting: *Deleted

## 2015-07-07 DIAGNOSIS — D75839 Thrombocytosis, unspecified: Secondary | ICD-10-CM

## 2015-07-07 DIAGNOSIS — D473 Essential (hemorrhagic) thrombocythemia: Secondary | ICD-10-CM

## 2015-07-11 ENCOUNTER — Other Ambulatory Visit: Payer: Medicare Other

## 2015-07-11 DIAGNOSIS — D473 Essential (hemorrhagic) thrombocythemia: Secondary | ICD-10-CM | POA: Diagnosis not present

## 2015-07-11 DIAGNOSIS — D75839 Thrombocytosis, unspecified: Secondary | ICD-10-CM

## 2015-07-11 LAB — CBC WITH DIFFERENTIAL/PLATELET
BASOS PCT: 1 % (ref 0–1)
Basophils Absolute: 0.1 10*3/uL (ref 0.0–0.1)
EOS ABS: 0.1 10*3/uL (ref 0.0–0.7)
EOS PCT: 2 % (ref 0–5)
HCT: 44.5 % (ref 36.0–46.0)
Hemoglobin: 15.2 g/dL — ABNORMAL HIGH (ref 12.0–15.0)
Lymphocytes Relative: 33 % (ref 12–46)
Lymphs Abs: 2 10*3/uL (ref 0.7–4.0)
MCH: 34.5 pg — AB (ref 26.0–34.0)
MCHC: 34.2 g/dL (ref 30.0–36.0)
MCV: 100.9 fL — AB (ref 78.0–100.0)
MONOS PCT: 12 % (ref 3–12)
MPV: 10.6 fL (ref 8.6–12.4)
Monocytes Absolute: 0.7 10*3/uL (ref 0.1–1.0)
NEUTROS PCT: 52 % (ref 43–77)
Neutro Abs: 3.2 10*3/uL (ref 1.7–7.7)
PLATELETS: 363 10*3/uL (ref 150–400)
RBC: 4.41 MIL/uL (ref 3.87–5.11)
RDW: 13.1 % (ref 11.5–15.5)
WBC: 6.1 10*3/uL (ref 4.0–10.5)

## 2015-07-12 ENCOUNTER — Other Ambulatory Visit: Payer: Self-pay | Admitting: Family Medicine

## 2015-07-12 ENCOUNTER — Other Ambulatory Visit: Payer: Self-pay

## 2015-07-12 DIAGNOSIS — D75839 Thrombocytosis, unspecified: Secondary | ICD-10-CM

## 2015-07-12 DIAGNOSIS — D473 Essential (hemorrhagic) thrombocythemia: Secondary | ICD-10-CM

## 2015-07-19 ENCOUNTER — Other Ambulatory Visit: Payer: Self-pay | Admitting: Family Medicine

## 2015-07-19 NOTE — Telephone Encounter (Signed)
Refill appropriate and filled per protocol. 

## 2015-07-27 ENCOUNTER — Other Ambulatory Visit: Payer: Self-pay | Admitting: Family Medicine

## 2015-07-27 DIAGNOSIS — Z79899 Other long term (current) drug therapy: Secondary | ICD-10-CM | POA: Diagnosis not present

## 2015-07-27 DIAGNOSIS — K50912 Crohn's disease, unspecified, with intestinal obstruction: Secondary | ICD-10-CM | POA: Diagnosis not present

## 2015-07-27 DIAGNOSIS — M542 Cervicalgia: Secondary | ICD-10-CM | POA: Diagnosis not present

## 2015-07-27 DIAGNOSIS — M79673 Pain in unspecified foot: Secondary | ICD-10-CM | POA: Diagnosis not present

## 2015-07-27 DIAGNOSIS — K589 Irritable bowel syndrome without diarrhea: Secondary | ICD-10-CM | POA: Diagnosis not present

## 2015-07-27 DIAGNOSIS — J449 Chronic obstructive pulmonary disease, unspecified: Secondary | ICD-10-CM | POA: Diagnosis not present

## 2015-07-27 DIAGNOSIS — M545 Low back pain: Secondary | ICD-10-CM | POA: Diagnosis not present

## 2015-07-27 NOTE — Telephone Encounter (Signed)
Medication called to pharmacy on 07/04/2015 with #2 refills.   Call placed to pharmacy and refills are on file.   Refused refill.

## 2015-07-28 ENCOUNTER — Telehealth: Payer: Self-pay | Admitting: *Deleted

## 2015-07-28 DIAGNOSIS — M546 Pain in thoracic spine: Secondary | ICD-10-CM | POA: Diagnosis not present

## 2015-07-28 DIAGNOSIS — M542 Cervicalgia: Secondary | ICD-10-CM | POA: Diagnosis not present

## 2015-07-28 DIAGNOSIS — M7750 Other enthesopathy of unspecified foot: Secondary | ICD-10-CM | POA: Diagnosis not present

## 2015-07-28 DIAGNOSIS — M9901 Segmental and somatic dysfunction of cervical region: Secondary | ICD-10-CM | POA: Diagnosis not present

## 2015-07-28 DIAGNOSIS — M79671 Pain in right foot: Secondary | ICD-10-CM | POA: Diagnosis not present

## 2015-07-28 DIAGNOSIS — M9902 Segmental and somatic dysfunction of thoracic region: Secondary | ICD-10-CM | POA: Diagnosis not present

## 2015-07-28 MED ORDER — ZOSTER VACCINE LIVE 19400 UNT/0.65ML ~~LOC~~ SOLR: 0.6500 mL | Freq: Once | SUBCUTANEOUS | Status: DC

## 2015-07-28 NOTE — Telephone Encounter (Signed)
Prescription sent to pharmacy.

## 2015-07-28 NOTE — Telephone Encounter (Signed)
Pt called asking if she could get a prescription sent over to her pharmacy for her to get the Shingles vaccine. States the pharmacy told her if we faxed over prescription her insurance would cover (?)  [employer]

## 2015-08-08 DIAGNOSIS — Z23 Encounter for immunization: Secondary | ICD-10-CM | POA: Diagnosis not present

## 2015-08-10 DIAGNOSIS — I10 Essential (primary) hypertension: Secondary | ICD-10-CM | POA: Diagnosis not present

## 2015-08-10 DIAGNOSIS — J441 Chronic obstructive pulmonary disease with (acute) exacerbation: Secondary | ICD-10-CM | POA: Diagnosis not present

## 2015-08-14 ENCOUNTER — Other Ambulatory Visit: Payer: Self-pay | Admitting: Family Medicine

## 2015-08-14 NOTE — Telephone Encounter (Signed)
Refill appropriate and filled per protocol. 

## 2015-08-16 ENCOUNTER — Other Ambulatory Visit: Payer: Self-pay | Admitting: Family Medicine

## 2015-08-16 NOTE — Telephone Encounter (Signed)
Refill appropriate and filled per protocol. 

## 2015-08-30 ENCOUNTER — Encounter: Payer: Self-pay | Admitting: Family Medicine

## 2015-08-30 ENCOUNTER — Ambulatory Visit (INDEPENDENT_AMBULATORY_CARE_PROVIDER_SITE_OTHER): Payer: Medicare Other | Admitting: Family Medicine

## 2015-08-30 VITALS — BP 122/64 | HR 70 | Temp 98.1°F | Resp 14 | Ht 60.0 in | Wt 105.0 lb

## 2015-08-30 DIAGNOSIS — R634 Abnormal weight loss: Secondary | ICD-10-CM

## 2015-08-30 MED ORDER — MIRTAZAPINE 15 MG PO TABS: 15.0000 mg | ORAL_TABLET | Freq: Every day | ORAL | Status: DC

## 2015-08-30 NOTE — Patient Instructions (Signed)
Bone Density schedule  Continue all other medications  Weight looks good! F/U 4 months

## 2015-08-31 ENCOUNTER — Other Ambulatory Visit: Payer: Self-pay | Admitting: *Deleted

## 2015-08-31 ENCOUNTER — Encounter: Payer: Self-pay | Admitting: Family Medicine

## 2015-08-31 DIAGNOSIS — M810 Age-related osteoporosis without current pathological fracture: Secondary | ICD-10-CM

## 2015-08-31 NOTE — Progress Notes (Signed)
Patient ID: [name] [name], female   DOB: 04-20-1956, 60 y.o.   MRN: [id number]      Subjective:    Patient ID: [name] [name], female    DOB: [birth year], 60 y.o.   MRN: [id number]  Patient presents for F/U and Health Maintence  Pt here for F/U, she was started on remeron for weight loss, weight up 5 lbs, appetite much improved. She is also using ENsure. No new concerns  She has had Hep C screening     Review Of Systems:  GEN- denies fatigue, fever, weight loss,weakness, recent illness HEENT- denies eye drainage, change in vision, nasal discharge, CVS- denies chest pain, palpitations RESP- denies SOB, cough, wheeze ABD- denies N/V, change in stools, abd pain Neuro- denies headache, dizziness, syncope, seizure activity       Objective:    BP 122/64 mmHg  Pulse 70  Temp(Src) 98.1 F (36.7 C) (Oral)  Resp 14  Ht 5' (1.524 m)  Wt 105 lb (47.628 kg)  BMI 20.51 kg/m2 GEN- NAD, alert and oriented x3 HEENT- PERRL, EOMI, non injected sclera, pink conjunctiva, MMM, oropharynx clear CVS- RRR, no murmur RESP-scattered wheeze, normal WOB, no retractions        Assessment & Plan:      Problem List Items Addressed This Visit    Loss of weight - Primary      Note: This dictation was prepared with Dragon dictation along with smaller phrase technology. Any transcriptional errors that result from this process are unintentional.

## 2015-08-31 NOTE — Assessment & Plan Note (Addendum)
Improved weight with remeron, improved appetite She has short stature and petite in general but keeping her above 100 pounds is beneficial Continue for now

## 2015-09-08 DIAGNOSIS — D3613 Benign neoplasm of peripheral nerves and autonomic nervous system of lower limb, including hip: Secondary | ICD-10-CM | POA: Diagnosis not present

## 2015-09-08 DIAGNOSIS — M79671 Pain in right foot: Secondary | ICD-10-CM | POA: Diagnosis not present

## 2015-09-21 ENCOUNTER — Other Ambulatory Visit (HOSPITAL_COMMUNITY): Payer: Self-pay | Admitting: Respiratory Therapy

## 2015-09-21 DIAGNOSIS — J441 Chronic obstructive pulmonary disease with (acute) exacerbation: Secondary | ICD-10-CM | POA: Diagnosis not present

## 2015-09-21 DIAGNOSIS — I10 Essential (primary) hypertension: Secondary | ICD-10-CM | POA: Diagnosis not present

## 2015-09-23 ENCOUNTER — Other Ambulatory Visit: Payer: Self-pay | Admitting: Family Medicine

## 2015-09-25 ENCOUNTER — Other Ambulatory Visit: Payer: Self-pay | Admitting: *Deleted

## 2015-09-25 MED ORDER — AMLODIPINE BESYLATE 5 MG PO TABS: 5.0000 mg | ORAL_TABLET | Freq: Every day | ORAL | Status: DC

## 2015-09-25 NOTE — Telephone Encounter (Signed)
Received fax requesting refill on Norvasc.   Refill appropriate and filled per protocol.

## 2015-10-04 ENCOUNTER — Other Ambulatory Visit: Payer: Self-pay | Admitting: Family Medicine

## 2015-10-04 NOTE — Telephone Encounter (Signed)
Refill appropriate and filled per protocol. 

## 2015-10-11 ENCOUNTER — Ambulatory Visit (HOSPITAL_COMMUNITY)
Admission: RE | Admit: 2015-10-11 | Discharge: 2015-10-11 | Disposition: A | Discharge status: Home / Self Care | Payer: Medicare Other | Source: Ambulatory Visit | Attending: Pulmonary Disease | Admitting: Pulmonary Disease

## 2015-10-11 DIAGNOSIS — J441 Chronic obstructive pulmonary disease with (acute) exacerbation: Secondary | ICD-10-CM | POA: Diagnosis not present

## 2015-10-11 LAB — PULMONARY FUNCTION TEST
DL/VA % PRED: 70 %
DL/VA: 2.89 ml/min/mmHg/L
DLCO UNC: 12.11 ml/min/mmHg
DLCO unc % pred: 69 %
FEF 25-75 POST: 1.04 L/s
FEF 25-75 Pre: 0.99 L/sec
FEF2575-%Change-Post: 4 %
FEF2575-%PRED-POST: 48 %
FEF2575-%PRED-PRE: 45 %
FEV1-%CHANGE-POST: -2 %
FEV1-%PRED-PRE: 72 %
FEV1-%Pred-Post: 70 %
FEV1-Post: 1.52 L
FEV1-Pre: 1.56 L
FEV1FVC-%CHANGE-POST: -1 %
FEV1FVC-%PRED-PRE: 78 %
FEV6-%Change-Post: 0 %
FEV6-%Pred-Post: 93 %
FEV6-%Pred-Pre: 92 %
FEV6-POST: 2.48 L
FEV6-Pre: 2.48 L
FEV6FVC-%CHANGE-POST: 0 %
FEV6FVC-%Pred-Post: 102 %
FEV6FVC-%Pred-Pre: 103 %
FVC-%Change-Post: 0 %
FVC-%PRED-POST: 91 %
FVC-%PRED-PRE: 91 %
FVC-PRE: 2.53 L
FVC-Post: 2.52 L
POST FEV1/FVC RATIO: 60 %
PRE FEV1/FVC RATIO: 62 %
PRE FEV6/FVC RATIO: 99 %
Post FEV6/FVC ratio: 99 %
RV % pred: 126 %
RV: 2.14 L
TLC % PRED: 115 %
TLC: 4.98 L

## 2015-10-11 MED ORDER — ALBUTEROL SULFATE (2.5 MG/3ML) 0.083% IN NEBU
2.5000 mg | INHALATION_SOLUTION | Freq: Once | RESPIRATORY_TRACT | Status: AC
Start: 2015-10-11 — End: 2015-10-11
  Administered 2015-10-11: 2.5 mg via RESPIRATORY_TRACT

## 2015-10-12 ENCOUNTER — Encounter: Payer: Self-pay | Admitting: Gastroenterology

## 2015-10-23 ENCOUNTER — Other Ambulatory Visit: Payer: Self-pay | Admitting: Family Medicine

## 2015-10-24 DIAGNOSIS — M79673 Pain in unspecified foot: Secondary | ICD-10-CM | POA: Diagnosis not present

## 2015-10-24 DIAGNOSIS — M545 Low back pain: Secondary | ICD-10-CM | POA: Diagnosis not present

## 2015-10-24 DIAGNOSIS — Z79899 Other long term (current) drug therapy: Secondary | ICD-10-CM | POA: Diagnosis not present

## 2015-10-24 DIAGNOSIS — J449 Chronic obstructive pulmonary disease, unspecified: Secondary | ICD-10-CM | POA: Diagnosis not present

## 2015-10-24 DIAGNOSIS — R1084 Generalized abdominal pain: Secondary | ICD-10-CM | POA: Diagnosis not present

## 2015-10-24 DIAGNOSIS — K50912 Crohn's disease, unspecified, with intestinal obstruction: Secondary | ICD-10-CM | POA: Diagnosis not present

## 2015-10-24 DIAGNOSIS — M542 Cervicalgia: Secondary | ICD-10-CM | POA: Diagnosis not present

## 2015-10-24 DIAGNOSIS — K589 Irritable bowel syndrome without diarrhea: Secondary | ICD-10-CM | POA: Diagnosis not present

## 2015-10-24 NOTE — Telephone Encounter (Signed)
okay

## 2015-10-24 NOTE — Telephone Encounter (Signed)
LRF 07/04/15 #30 + 2   LOV 08/30/15  OK refill?

## 2015-10-26 NOTE — Telephone Encounter (Signed)
rx called in

## 2015-11-02 ENCOUNTER — Other Ambulatory Visit: Payer: Self-pay | Admitting: Family Medicine

## 2015-11-02 NOTE — Telephone Encounter (Signed)
Refill appropriate and filled per protocol. 

## 2015-11-21 DIAGNOSIS — D3613 Benign neoplasm of peripheral nerves and autonomic nervous system of lower limb, including hip: Secondary | ICD-10-CM | POA: Diagnosis not present

## 2015-11-21 DIAGNOSIS — B353 Tinea pedis: Secondary | ICD-10-CM | POA: Diagnosis not present

## 2015-11-21 DIAGNOSIS — M79671 Pain in right foot: Secondary | ICD-10-CM | POA: Diagnosis not present

## 2015-11-27 ENCOUNTER — Other Ambulatory Visit: Payer: Self-pay | Admitting: Family Medicine

## 2015-11-27 NOTE — Telephone Encounter (Signed)
Refill appropriate and filled per protocol. 

## 2015-12-05 ENCOUNTER — Other Ambulatory Visit: Payer: Self-pay | Admitting: Family Medicine

## 2015-12-06 NOTE — Telephone Encounter (Signed)
Refill appropriate and filled per protocol. 

## 2015-12-12 DIAGNOSIS — B353 Tinea pedis: Secondary | ICD-10-CM | POA: Diagnosis not present

## 2015-12-21 ENCOUNTER — Ambulatory Visit (INDEPENDENT_AMBULATORY_CARE_PROVIDER_SITE_OTHER): Payer: Medicare Other | Admitting: Gastroenterology

## 2015-12-21 ENCOUNTER — Encounter: Payer: Self-pay | Admitting: Gastroenterology

## 2015-12-21 DIAGNOSIS — K50019 Crohn's disease of small intestine with unspecified complications: Secondary | ICD-10-CM | POA: Diagnosis not present

## 2015-12-21 DIAGNOSIS — K219 Gastro-esophageal reflux disease without esophagitis: Secondary | ICD-10-CM | POA: Diagnosis not present

## 2015-12-21 DIAGNOSIS — R7402 Elevation of levels of lactic acid dehydrogenase (LDH): Secondary | ICD-10-CM

## 2015-12-21 DIAGNOSIS — R1310 Dysphagia, unspecified: Secondary | ICD-10-CM | POA: Diagnosis not present

## 2015-12-21 DIAGNOSIS — M810 Age-related osteoporosis without current pathological fracture: Secondary | ICD-10-CM

## 2015-12-21 DIAGNOSIS — R740 Nonspecific elevation of levels of transaminase and lactic acid dehydrogenase [LDH]: Secondary | ICD-10-CM | POA: Diagnosis not present

## 2015-12-21 NOTE — Progress Notes (Signed)
ON RECALL  °

## 2015-12-21 NOTE — Assessment & Plan Note (Addendum)
NEED CACO3 WITH VIT D TID WITH MEALS. CONSIDER ENDOCRINE NPV. CHECK VIT D LEVEL

## 2015-12-21 NOTE — Assessment & Plan Note (Signed)
SYMPTOMS CONTROLLED/RESOLVED.  CONTINUE TO MONITOR SYMPTOMS. 

## 2015-12-21 NOTE — Progress Notes (Signed)
Added labs faxed to [facility].

## 2015-12-21 NOTE — Progress Notes (Signed)
cc'ed to pcp °

## 2015-12-21 NOTE — Patient Instructions (Addendum)
WE NEED TO CHECK YOUR LIVER ENZYMES.  DRINK WATER TO KEEP YOUR URINE LIGHT YELLOW.  FOLLOW A LOW FAT DIET.   When eating dairy TAKE LACTASE PILLS, EAT LACTOSE FREE ICE CREAM, OR DRINK LACTAID MILK.  CONTINUE QUESTRAN.  FOLLOW UP IN 6 MOS.

## 2015-12-21 NOTE — Progress Notes (Signed)
Subjective:    Patient ID: [name] [name], female    DOB: October 15, 1955, 60 y.o.   MRN: [id number]  [name], MD   HPI HAVING TROUBLE WITH TEETH ON LEFT SIDE/SWELLING. CAN HARDLY OPEN HER MOUTH. SURGERY TODAY. EATING BABY FOOD. BOWELS GOOD FOR THE MOST PART. LACTOSE FREE ICE CREAM. BMs: 1-2 RUNNY ASSOCIATED WITH ABDOMINAL PAIN/HARD DUE TO MEDS. DRINKING ENSURE TO KEEP UP NUTRITION AND WITH IBS WILL FLARE. HARD TO SWALLOW LARGE PILLS. WEIGHT DOWN 4 LBS SINCE 2015.  PT DENIES FEVER, CHILLS, HEMATOCHEZIA, nausea, vomiting, melena, CHEST PAIN, SHORTNESS OF BREATH,  CHANGE IN BOWEL IN HABITS, constipation, problems swallowing, OR heartburn or indigestion.   Past Medical History:  Diagnosis Date  . Allergic rhinitis   . Anastomotic ulcer JAN 2009  . Anxiety   . Asthma   . BMI (body mass index) 20.0-29.9 2009 121 lbs  . 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 Crohn disease ([place])   . Crohn disease ([place])   . CTS (carpal tunnel syndrome)   . Diarrhea MULITIFACTORIAL   IBS, LACTOSE INTOLERANCE, SBBO, BILE-SALT  . Elevated liver enzymes 2009: BMI 24 ?Etoh ALT 94, AST 51,  NEG ANA, qIGs& ASMA   MAY 2012 AST 52 ALT 38  . Esophageal stricture 2009  . GERD (gastroesophageal reflux disease)   . Hemorrhoid   . Hyperlipemia   . Hypertension   . Inflammatory bowel disease 2006 CD   SURGICAL REMISSION  . LBP (low back pain)   . Migraine   . PONV (postoperative nausea and vomiting)   . Shortness of breath    exertion and humidity  . Sleep apnea    Stop [name]    Past Surgical History:  Procedure Laterality Date  . BACK SURGERY    . BILATERAL SALPINGOOPHORECTOMY    . BIOPSY  12/24/2011   Procedure: BIOPSY;  Surgeon: Danie Binder, MD;  Location: [facility];  Service: Endoscopy;;  Gastric Biopsies  . BIOPSY N/A 06/30/2012   Procedure: BIOPSY;  Surgeon: Danie Binder, MD;  Location: [facility];  Service: Endoscopy;  Laterality: N/A;  Gastric and Esophageal Biopsies  . CARPAL  TUNNEL RELEASE  LEFT  . COLON SURGERY    . COLONOSCOPY  JAN 2009 DIARRHEA, ABD PAIN, BRBPR   ANASTOMOTIC ULCER(3MM), IH OM:BTDHRC ULCER, NL COLON bX  . DILATION AND CURETTAGE OF UTERUS    . ESOPHAGEAL DILATION  12/24/2011   SLF: A stricture was found in the distal esophagus/ Moderate gastritis/ MILD Duodenitis  . ESOPHAGOGASTRODUODENOSCOPY  02/07/09   mild gastritis  . ESOPHAGOGASTRODUODENOSCOPY (EGD) WITH PROPOFOL N/A 06/30/2012   SLF: 1. No definite stricture appreciated 2. small hiatal hernia 3. Gastritis  . HEMICOLECTOMY  RIGHT 2006  . HERNIA REPAIR    . LUMBAR DISC SURGERY    . SAVORY DILATION N/A 06/30/2012   Procedure: SAVORY DILATION;  Surgeon: Danie Binder, MD;  Location: [facility];  Service: Endoscopy;  Laterality: N/A;  12.83m , 122m 1577m. UPPER GASTROINTESTINAL ENDOSCOPY  JAN 2009 ABD PAIN   GASTRITIS, ESO RING  . UPPER GASTROINTESTINAL ENDOSCOPY  OCT 2010 ABD PAIN, DYSPHAGIA   DIL 15MM, GASTRITIS, NL DUODENUM  . UPPER GASTROINTESTINAL ENDOSCOPY  OCT 2010 DYSPHAGIA   DIL 17 MM, GASTRITIS, NL DUODENUM  . vocal cord surgery  Sep 20, 2010   precancerous areas removed  . wisdom tooth extraction     pin placement due to broken jaw   Allergies  Allergen Reactions  . Lovastatin Other (See Comments)    Chest pain  . Nexium [Esomeprazole Magnesium] Diarrhea    Abdominal pain    Current Outpatient Prescriptions  Medication Sig Dispense Refill  . amLODipine (NORVASC) 5 MG tablet Take 1 tablet (5 mg total) by mouth daily.    . AUGMENTIN 875-125 MG tablet BID    .  FIORICET50-325-40-30 MG capsule Take 1 Q4H PRN as needed for headache.    . calcium 500 MG chewable tablet 1-2 tabletsBID PRN for heartburn.    Lucrezia Starch 4 GM/DOSE powder  TWO GRAMS BID WITH A MEAL    . FLEXERIL 5 MG tablet Take 5 mg BID PRN for muscle spasms.     . diazepam (VALIUM) 5 MG tablet TAKE ONE Q12H PRN  ANXIETY/ABD PAIN    . fluorometholone ophthalmic ointment 1 application 4 (four) times daily.    .  fluticasone (CUTIVATE) 0.05 % cream PRN    . FLONASE 50 MCG/ACT nasal spray USE TWO SPRAY DAILY PRN ALLERGY     . gabapentin (NEURONTIN) 300 MG capsule Take 300 mg by mouth 2 (two) times daily.    Marland Kitchen HYDROcodone-acetaminophen (NORCO) 10-325 MG per tablet Take 1 tablet by mouth every 6 (six) hours as needed for moderate pain.    Marland Kitchen ketoconazole (NIZORAL) 2 % shampoo Apply 1 application topically 2 (two) times a week. For 4 weeks1    . XIIDRA 5 % SOLN Apply to eye 2 (two) times daily.    Marland Kitchen HYZAAR 50-12.5 MG tablet Take 1 tablet by mouth daily.    Marland Kitchen LOTEMAX 0.5 % ophthalmic suspension Place 2 drops BID    . mirtazapine (REMERON) 15 MG tablet Take 1 tablet (15 mg total) by mouth at bedtime. FOR APPETITE    . nystatin (MYCOSTATIN) 100000 UNIT/ML suspension TAKE  5 ML BY MOUTH 4 TIMES DAILY AS NEEDED    . Probiotic Product (PROBIOTIC DAILY PO) Take by mouth qam    . SYMBICORT 80-4.5 MCG/ACT inhaler INHALE TWO PUFFS bid prn    . VENTOLIN HFA  inhaler I TWO PUFFS q4h prn FOR WHEEZING     Review of Systems PER HPI OTHERWISE ALL SYSTEMS ARE NEGATIVE.    Objective:   Physical Exam  Constitutional: She is oriented to person, place, and time. She appears well-developed and well-nourished. No distress.  HENT:  Head: Normocephalic and atraumatic.  Mouth/Throat: Oropharynx is clear and moist. No oropharyngeal exudate.  Eyes: Pupils are equal, round, and reactive to light. No scleral icterus.  Neck: Normal range of motion. Neck supple.  Cardiovascular: Normal rate, regular rhythm and normal heart sounds.   Pulmonary/Chest: Effort normal and breath sounds normal. No respiratory distress.  Abdominal: Soft. Bowel sounds are normal. She exhibits no distension. There is tenderness. There is no rebound and no guarding.  MILD TTP IN THE EPIGASTRIUM   Musculoskeletal: She exhibits no edema.  Lymphadenopathy:    She has no cervical adenopathy.  Neurological: She is alert and oriented to person, place, and time.    Psychiatric: She has a normal mood and affect.  Vitals reviewed.     Assessment & Plan:

## 2015-12-21 NOTE — Assessment & Plan Note (Signed)
Last hfp   Feb 2017  REPEAT HFP.

## 2015-12-21 NOTE — Assessment & Plan Note (Addendum)
SURGICAL REMISSION-SYMPTOMS EXACERBATED Y IBS, SIBO, LACTOSE INTOLERANCE, AND BILE SALT INDUCED DIARRHEA.  PT NEEDS REPEAT B12, VITAMIN D LEVEL, AND VZV IgG WITH NEXT BLOOD DRAW. NO SMOKING. DECLINED COLONOSCOPY TODAY. WILL NEED SURVEILLANCE TCS Q1-2 YEARS. CONTACT IN 3 MOS TO SCHEDULE. PT WOULD LIKE TO WAIT DUE TO ONGOING DENTAL PROBLEMS. NEEDS SHINGLES AND FLU VACCINE. CONSIDER HEP A/B VACCINE. REVIEW WITH PCP-PNEUMOVAX, MENINGOCOCCAL MENINGITIS, dT. REFER TO ENDOCRINOLOGY FOR OSTEOPOROSIS CONTINUE CACO3 WITH VITAMIN D. FOLLOW UP IN 6 MOS.

## 2016-01-02 ENCOUNTER — Other Ambulatory Visit: Payer: Self-pay | Admitting: Family Medicine

## 2016-01-02 ENCOUNTER — Telehealth: Payer: Self-pay | Admitting: Family Medicine

## 2016-01-02 DIAGNOSIS — Z1231 Encounter for screening mammogram for malignant neoplasm of breast: Secondary | ICD-10-CM

## 2016-01-02 NOTE — Telephone Encounter (Signed)
Izora Gala from [facility] called regarding an order that was sent for [name] [name] to have a Bone Density Study this year. Per Izora Gala, she had one on May 6th, 2016 and usually patients have this scan every two years. Izora Gala was told by the patient that Dr. Buelah Manis instructed her to have another one this year due to a type of injection she received. Currently the diagnosis for the Bone Density for 2017 is osteoporosis and Izora Gala is unsure if the patient's insurance carrier will cover the scan under that diagnosis. She's going to discuss this with the [facility] Radiological Tech at their location tomorrow and let us know if the diagnosis needs to be changed. Please contact Izora Gala to confirm if Dr. Buelah Manis wants the patient to have this scan again this year.  Nancy's ph# [phone] Thank you.

## 2016-01-03 NOTE — Telephone Encounter (Signed)
Verified with Izora Gala at [facility] radiology that Dr Buelah Manis does want another Dexa scan this year.  Pt already has Osteoporosis and is on long term steroid use and has received the Reclast infusion.  Needs more frequent monitoring.

## 2016-01-04 ENCOUNTER — Other Ambulatory Visit (HOSPITAL_COMMUNITY): Payer: Medicare Other

## 2016-01-04 ENCOUNTER — Ambulatory Visit (HOSPITAL_COMMUNITY): Payer: Medicare Other

## 2016-01-05 ENCOUNTER — Ambulatory Visit (HOSPITAL_COMMUNITY)
Admission: RE | Admit: 2016-01-05 | Discharge: 2016-01-05 | Disposition: A | Discharge status: Home / Self Care | Payer: Medicare Other | Source: Ambulatory Visit | Attending: Family Medicine | Admitting: Family Medicine

## 2016-01-05 DIAGNOSIS — E559 Vitamin D deficiency, unspecified: Secondary | ICD-10-CM | POA: Insufficient documentation

## 2016-01-05 DIAGNOSIS — M818 Other osteoporosis without current pathological fracture: Secondary | ICD-10-CM | POA: Diagnosis not present

## 2016-01-05 DIAGNOSIS — Z1231 Encounter for screening mammogram for malignant neoplasm of breast: Secondary | ICD-10-CM | POA: Insufficient documentation

## 2016-01-05 DIAGNOSIS — Z7952 Long term (current) use of systemic steroids: Secondary | ICD-10-CM | POA: Insufficient documentation

## 2016-01-05 DIAGNOSIS — M810 Age-related osteoporosis without current pathological fracture: Secondary | ICD-10-CM | POA: Insufficient documentation

## 2016-01-05 DIAGNOSIS — Z780 Asymptomatic menopausal state: Secondary | ICD-10-CM | POA: Insufficient documentation

## 2016-01-09 ENCOUNTER — Ambulatory Visit (INDEPENDENT_AMBULATORY_CARE_PROVIDER_SITE_OTHER): Payer: Medicare Other | Admitting: Family Medicine

## 2016-01-09 ENCOUNTER — Encounter: Payer: Self-pay | Admitting: Family Medicine

## 2016-01-09 VITALS — BP 130/74 | HR 98 | Temp 98.7°F | Resp 18 | Ht 60.0 in | Wt 105.0 lb

## 2016-01-09 DIAGNOSIS — Z23 Encounter for immunization: Secondary | ICD-10-CM | POA: Diagnosis not present

## 2016-01-09 DIAGNOSIS — E785 Hyperlipidemia, unspecified: Secondary | ICD-10-CM | POA: Diagnosis not present

## 2016-01-09 DIAGNOSIS — I10 Essential (primary) hypertension: Secondary | ICD-10-CM | POA: Diagnosis not present

## 2016-01-09 DIAGNOSIS — M810 Age-related osteoporosis without current pathological fracture: Secondary | ICD-10-CM | POA: Diagnosis not present

## 2016-01-09 NOTE — Assessment & Plan Note (Signed)
Cholesterol at goal, no changes Continue [place]

## 2016-01-09 NOTE — Assessment & Plan Note (Signed)
Plan for 2nd reclast infusion Continue calcium and vitamin D

## 2016-01-09 NOTE — Assessment & Plan Note (Signed)
Controlled no change to medication

## 2016-01-09 NOTE — Patient Instructions (Addendum)
F/U Feb for Physical Reclast infusion to be done Get labs done  Flu shot given

## 2016-01-09 NOTE — Progress Notes (Signed)
   Subjective:    Patient ID: [name] [name], female    DOB: [birth year], 58 y.o.   MRN: [id number]  Patient presents for Follow-up (4 months- is not fasting) Patient here to follow-up chronic medical problems. Her mammogram recently was normal she had bone density performed since she had one brief class infusion last year there was minimal improvement in her spine and femur area she had further bone loss. She is taking calcium and vitamin D.  She still followed by gastroenterology for her Crohn's disease chronic elevated transaminitis and weight loss. Her chronic pain is controlled by her pain clinic- Dr.[place]oonqu[place] she is on hydrocodone  At her last visit she was continued on Remeron for weight she gained to 105 pounds, and her gastroenterology visit in August her weight was 100 pounds She did Recently have a dental flap procedure performed by Dr. Linton Rump she will also have some type of jaw reconstruction she is tolerating soft foods  COPD she is currently on Symbicort is also followed by Dr. Luan Pulling pulmonary- needs flu shot this year  Review Of Systems:  GEN- denies fatigue, fever, weight loss,weakness, recent illness HEENT- denies eye drainage, change in vision, nasal discharge, CVS- denies chest pain, palpitations RESP- denies SOB, cough, wheeze ABD- denies N/V, change in stools, abd pain GU- denies dysuria, hematuria, dribbling, incontinence MSK- denies joint pain, muscle aches, injury Neuro- denies headache, dizziness, syncope, seizure activity       Objective:    BP 130/74 (BP Location: Left Arm, Patient Position: Sitting, Cuff Size: Normal)   Pulse 98   Temp 98.7 F (37.1 C) (Oral)   Resp 18   Ht 5' (1.524 m)   Wt 105 lb (47.6 kg)   SpO2 95% Comment: RA  BMI 20.51 kg/m  GEN- NAD, alert and oriented x3 HEENT- PERRL, EOMI, non injected sclera, pink conjunctiva, MMM, oropharynx clear Neck- Supple, no thyromegaly CVS- RRR, no murmur RESP-CTAB ABD-NABS,soft,NT,ND EXT- No  edema Pulses- Radial, DP- 2+        Assessment & Plan:      Problem List Items Addressed This Visit    Osteoporosis - Primary    Plan for 2nd reclast infusion Continue calcium and vitamin D      Hyperlipidemia    Cholesterol at goal, no changes Continue questran      Essential hypertension    Controlled no change to medication       Other Visit Diagnoses   None.     Note: This dictation was prepared with Dragon dictation along with smaller phrase technology. Any transcriptional errors that result from this process are unintentional.

## 2016-01-17 DIAGNOSIS — M545 Low back pain: Secondary | ICD-10-CM | POA: Diagnosis not present

## 2016-01-17 DIAGNOSIS — M79673 Pain in unspecified foot: Secondary | ICD-10-CM | POA: Diagnosis not present

## 2016-01-17 DIAGNOSIS — K589 Irritable bowel syndrome without diarrhea: Secondary | ICD-10-CM | POA: Diagnosis not present

## 2016-01-17 DIAGNOSIS — Z79899 Other long term (current) drug therapy: Secondary | ICD-10-CM | POA: Diagnosis not present

## 2016-01-17 DIAGNOSIS — R1084 Generalized abdominal pain: Secondary | ICD-10-CM | POA: Diagnosis not present

## 2016-01-17 DIAGNOSIS — J449 Chronic obstructive pulmonary disease, unspecified: Secondary | ICD-10-CM | POA: Diagnosis not present

## 2016-01-17 DIAGNOSIS — Z79891 Long term (current) use of opiate analgesic: Secondary | ICD-10-CM | POA: Diagnosis not present

## 2016-01-17 DIAGNOSIS — M542 Cervicalgia: Secondary | ICD-10-CM | POA: Diagnosis not present

## 2016-01-17 DIAGNOSIS — K50912 Crohn's disease, unspecified, with intestinal obstruction: Secondary | ICD-10-CM | POA: Diagnosis not present

## 2016-01-22 ENCOUNTER — Other Ambulatory Visit: Payer: Self-pay | Admitting: Family Medicine

## 2016-01-24 NOTE — Telephone Encounter (Signed)
okay

## 2016-01-24 NOTE — Telephone Encounter (Signed)
Ok to refill??  Last office visit 01/09/2016.  Last refill 10/26/2015, #2 refills.

## 2016-01-25 ENCOUNTER — Other Ambulatory Visit: Payer: Self-pay | Admitting: *Deleted

## 2016-01-25 MED ORDER — DIAZEPAM 5 MG PO TABS: ORAL_TABLET | ORAL | 2 refills | Status: DC

## 2016-01-25 MED ORDER — ALBUTEROL SULFATE HFA 108 (90 BASE) MCG/ACT IN AERS: INHALATION_SPRAY | RESPIRATORY_TRACT | 2 refills | Status: DC

## 2016-01-28 ENCOUNTER — Other Ambulatory Visit: Payer: Self-pay | Admitting: Gastroenterology

## 2016-02-02 NOTE — Telephone Encounter (Signed)
Open in error

## 2016-02-05 ENCOUNTER — Telehealth: Payer: Self-pay | Admitting: *Deleted

## 2016-02-05 NOTE — Telephone Encounter (Signed)
Received call from [place] with [facility].   Reports that patient has been scheduled for Recalst infusion on 02/20/2016 at 9:30am.

## 2016-02-07 ENCOUNTER — Telehealth: Payer: Self-pay

## 2016-02-07 ENCOUNTER — Other Ambulatory Visit: Payer: Self-pay

## 2016-02-07 ENCOUNTER — Encounter: Payer: Self-pay | Admitting: Gastroenterology

## 2016-02-07 ENCOUNTER — Ambulatory Visit (INDEPENDENT_AMBULATORY_CARE_PROVIDER_SITE_OTHER): Payer: Medicare Other | Admitting: Gastroenterology

## 2016-02-07 VITALS — BP 110/72 | HR 89 | Temp 97.4°F | Ht 60.0 in | Wt 100.2 lb

## 2016-02-07 DIAGNOSIS — R634 Abnormal weight loss: Secondary | ICD-10-CM

## 2016-02-07 DIAGNOSIS — R1310 Dysphagia, unspecified: Secondary | ICD-10-CM

## 2016-02-07 DIAGNOSIS — D751 Secondary polycythemia: Secondary | ICD-10-CM

## 2016-02-07 DIAGNOSIS — M949 Disorder of cartilage, unspecified: Secondary | ICD-10-CM

## 2016-02-07 DIAGNOSIS — M899 Disorder of bone, unspecified: Secondary | ICD-10-CM | POA: Diagnosis not present

## 2016-02-07 DIAGNOSIS — R062 Wheezing: Secondary | ICD-10-CM

## 2016-02-07 DIAGNOSIS — K50019 Crohn's disease of small intestine with unspecified complications: Secondary | ICD-10-CM | POA: Diagnosis not present

## 2016-02-07 DIAGNOSIS — R0600 Dyspnea, unspecified: Secondary | ICD-10-CM

## 2016-02-07 DIAGNOSIS — R109 Unspecified abdominal pain: Secondary | ICD-10-CM

## 2016-02-07 MED ORDER — PROMETHAZINE HCL 12.5 MG PO TABS: ORAL_TABLET | ORAL | 3 refills | Status: DC

## 2016-02-07 MED ORDER — LIDOCAINE VISCOUS 2 % MT SOLN: OROMUCOSAL | 1 refills | Status: DC

## 2016-02-07 NOTE — Progress Notes (Signed)
ON RECALL  °

## 2016-02-07 NOTE — Assessment & Plan Note (Signed)
ASSOCIATED WITH ODYNOPHAGIA AND VOMITING.  DIFFERENTIAL DIAGNOSIS INCLUDES: REFLUX ESOPHAGITIS WITH PEPTIC STRICTURE, GASTROPARESIS, LESS LIKELY PANCREAS, LUNG, OR ESOPHAGEAL CANCER.  COMPLETE LABS, CXR, AND CT THIS WEEK. COMPETE UPPER ENDOSCOPY TO DILATE YOUR ESOPHAGUS IN 2 WEEKS. DISCUSSED PROCEDURE, BENEFITS, & RISKS: < 1% chance of medication reaction, bleeding, perforation, or rupture of spleen/liver. START DEXILANT DAILY TO CONTROL REFLUX. USE VISCOUS LIDOCAINE 2 TSP AS NEEDED FOR FLARES OF HEARTBURN, CHEST PAIN, OR UPPER ABDOMINAL PAIN. REPEAT DOSE EVERY 4 HOURS AS NEEDED. USE PHENERGAN 30 MINS PRIOR TO MEALS OR AS NEEDED TO HELP CONTROL NAUSEA OR VOMITING. FOLLOW UP IN 4 MOS.

## 2016-02-07 NOTE — Telephone Encounter (Signed)
Called pt and informed of pre-op appt 02/14/16 at 12:45 pm.

## 2016-02-07 NOTE — Assessment & Plan Note (Signed)
IN SURGICAL REMISSION.  COMPLETE LABS, CXR, AND CT THIS WEEK. NEEDS VITAMIN D AND B12. USE VISCOUS LIDOCAINE 2 TSP AS NEEDED FOR FLARES OF HEARTBURN, CHEST PAIN, OR UPPER ABDOMINAL PAIN. REPEAT DOSE EVERY 4 HOURS AS NEEDED. USE PHENERGAN 30 MINS PRIOR TO MEALS OR AS NEEDED TO HELP CONTROL NAUSEA OR VOMITING. FOLLOW UP IN 4 MOS.

## 2016-02-07 NOTE — Progress Notes (Signed)
cc'ed to pcp °

## 2016-02-07 NOTE — Patient Instructions (Addendum)
COMPLETE LABS, CXR, AND CT THIS WEEK.  COMPETE UPPER ENDOSCOPY TO DILATE YOUR ESOPHAGUS IN 2 WEEKS.  START DEXILANT DAILY TO CONTROL REFLUX.  USE VISCOUS LIDOCAINE 2 TSP AS NEEDED FOR FLARES OF HEARTBURN, CHEST PAIN, OR UPPER ABDOMINAL PAIN. REPEAT DOSE EVERY 4 HOURS AS NEEDED.  USE PHENERGAN 30 MINS PRIOR TO MEALS OR AS NEEDED TO HELP CONTROL NAUSEA OR VOMITING.   FOLLOW UP IN 4 MOS.

## 2016-02-07 NOTE — Addendum Note (Signed)
Addended by: Claudina Lick on: 02/07/2016 11:18 AM   Modules accepted: Orders

## 2016-02-07 NOTE — Progress Notes (Signed)
Subjective:    Patient ID: [name] [name], female    DOB: [birth year], 60 y.o.   MRN: [id number]  [name], MD  HPI HAVING TROUBLE WITH BELLY FOR QUITE A WHILE. SINCE SHE HAD HER TOOTH SURGERY. HAD PROBLEM WITH HEALING UP AND GOTTEN WORSE IN PAST WEEK OR TWO. HURTS IN HER EPIGASTRIUM(ACHY) AND MIDDLE OF CHEST. MAU RADIATE TO RIGHT BACK. BAD HEARTBURN. GETS UP IN THE AMA DN IT ACHES AND THROBS. NO ASPIRIN, BC/GOODY POWDERS, IBUPROFEN/MOTRIN, OR NAPROXEN/ALEVE. SAW BLOOD IN VOMIT ONCE-A LITTLE BIT. PAIN WHEN SHE SWALLOWS. HARD TO TAKE PILLS. HASN'T BEEN EATING BECAUSE SHE THROWS UP. BMS: NONE REALLY. USUALLY NL IF IT DOES COMES OUT. BUT BEEN DIARRHEA SINCE SUN(OFF AND ON NOT A LOT).  NO BLOOD WORK OR IMAGING. SOB NO WORSE SINCE PAIN STARTED AND CAN GET TO WHEEZING. LAST VOMITED YESTERDAY MORNING. COUGHING A LOT MORE THAN IN THE PAST. COUGHS AND COUGHS AND COUGHS THEN THROWS UP AND GETS EXCRUCIATING IN THE MIDDLE OF HER CHEST. MOST OF TIME VOMITING IN THE AM. PHELGM IN THE BACK OF HER THROAT WHEN SHE WAKES UP. WHEN SHE EATS SHE THROWS UP AND DEPENDS ON WHAT SHE EATS THROUGH OUT THE DAY. VOMITING BROUGHT ON BY COUGHING AND FOOD. COUGH UP MUCOUS: YELLOW. OCCASIONALLY FEELS LIKE FOOD OR PILLS GET STUCK: ALL THE TIME. WAS ON ABX FOR APR 2017 UNTIL LAST ABX SEP 2017. NO SMOKING. QUIT 1/2 PK/DAY AND STOPPED OVER 6 MOS. HAD SMOKED FOR AT LEAST 29YRS.   PT DENIES FEVER, CHILLS, DYSURIA, HEMATURIA, HEMATOCHEZIA,  nausea, vomiting, melena, diarrhea, CHANGE IN BOWEL IN HABITS, constipation, OR problems with sedation.    Past Medical History:  Diagnosis Date  . Allergic rhinitis   . Anastomotic ulcer JAN 2009  . Anxiety   . Asthma   . BMI (body mass index) 20.0-29.9 2009 121 lbs  . 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 Crohn disease ([place])   . Crohn disease ([place])   . CTS (carpal tunnel syndrome)   . Diarrhea MULITIFACTORIAL   IBS, LACTOSE INTOLERANCE, SBBO, BILE-SALT  . Elevated liver enzymes  2009: BMI 24 ?Etoh ALT 94, AST 51,  NEG ANA, qIGs& ASMA   MAY 2012 AST 52 ALT 38  . Esophageal stricture 2009  . GERD (gastroesophageal reflux disease)   . Hemorrhoid   . Hyperlipemia   . Hypertension   . Inflammatory bowel disease 2006 CD   SURGICAL REMISSION  . LBP (low back pain)   . Migraine   . PONV (postoperative nausea and vomiting)   . Shortness of breath    exertion and humidity  . Sleep apnea    Stop [name]    Past Surgical History:  Procedure Laterality Date  . BACK SURGERY    . BILATERAL SALPINGOOPHORECTOMY    . BIOPSY  12/24/2011   Procedure: BIOPSY;  Surgeon: Danie Binder, MD;  Location: [facility];  Service: Endoscopy;;  Gastric Biopsies  . BIOPSY N/A 06/30/2012   Procedure: BIOPSY;  Surgeon: Danie Binder, MD;  Location: [facility];  Service: Endoscopy;  Laterality: N/A;  Gastric and Esophageal Biopsies  . CARPAL TUNNEL RELEASE  LEFT  . COLON SURGERY    . COLONOSCOPY  JAN 2009 DIARRHEA, ABD PAIN, BRBPR   ANASTOMOTIC ULCER(3MM), IH EY:CXKGYJ ULCER, NL COLON bX  . DILATION AND CURETTAGE OF UTERUS    . ESOPHAGEAL DILATION  12/24/2011   SLF: A stricture was found in the distal esophagus/  Moderate gastritis/ MILD Duodenitis  . ESOPHAGOGASTRODUODENOSCOPY  02/07/09   mild gastritis  . ESOPHAGOGASTRODUODENOSCOPY (EGD) WITH PROPOFOL N/A 06/30/2012   SLF: 1. No definite stricture appreciated 2. small hiatal hernia 3. Gastritis  . HEMICOLECTOMY  RIGHT 2006  . HERNIA REPAIR    . LUMBAR DISC SURGERY    . SAVORY DILATION N/A 06/30/2012   Procedure: SAVORY DILATION;  Surgeon: Danie Binder, MD;  Location: [facility];  Service: Endoscopy;  Laterality: N/A;  12.21m , 121m 1515m. UPPER GASTROINTESTINAL ENDOSCOPY  JAN 2009 ABD PAIN   GASTRITIS, ESO RING  . UPPER GASTROINTESTINAL ENDOSCOPY  OCT 2010 ABD PAIN, DYSPHAGIA   DIL 15MM, GASTRITIS, NL DUODENUM  . UPPER GASTROINTESTINAL ENDOSCOPY  OCT 2010 DYSPHAGIA   DIL 17 MM, GASTRITIS, NL DUODENUM  . vocal cord surgery  Sep 20, 2010   precancerous areas removed  . wisdom tooth extraction     pin placement due to broken jaw   Allergies  Allergen Reactions  . Lovastatin Other (See Comments)    Chest pain  . Naproxen Other (See Comments)    Chest pain/ SOB   . Nexium [Esomeprazole Magnesium] Diarrhea    Abdominal pain    Current Outpatient Prescriptions  Medication Sig Dispense Refill  . albuterol (VENTOLIN HFA) 108 (90 Base) MCG/ACT inhaler INHALE TWO PUFFS BY MOUTH EVERY 4 HOURS AS NEEDED FOR WHEEZING    . amLODipine (NORVASC) 5 MG tablet Take 1 tablet (5 mg total) by mouth daily.    . butalbital-acetaminophen-caffeine (FIORICET/CODEINE) 50-325-40-30 MG capsule Take 1 capsule by mouth every 4 (four) hours as needed for headache.    . calcium carbonate (TUMS - DOSED IN MG ELEMENTAL CALCIUM) 500 MG chewable tablet Chew 1-2 tablets by mouth 2 (two) times daily as needed for indigestion or heartburn.    . cholestyramine (QUESTRAN) 4 GM/DOSE powder DISSOLVE AND TAKE 2 GRAMS BY MOUTH TWICE DAILY WITH A MEAL - DO NOT TAKE WITHIN 2 HOURS OF OTHER MEDICATIONS    . cyclobenzaprine (FLEXERIL) 5 MG tablet Take 5 mg by mouth 2 (two) times daily as needed for muscle spasms.     . diazepam (VALIUM) 5 MG tablet TAKE ONE TABLET BY MOUTH EVERY 12 HOURS AS NEEDED FOR ANXIETY **REFILLS MUST BE 30 DAYS APART**    . fluorometholone (FML) 0.1 % ophthalmic ointment 1 application 4 (four) times daily.    . fluticasone (CUTIVATE) 0.05 % cream     . fluticasone (FLONASE) 50 MCG/ACT nasal spray USE TWO SPRAY(S) IN EACH NOSTRIL ONCE DAILY AS NEEDED FOR ALLERGY OR RHINITIS    . gabapentin (NEURONTIN) 300 MG capsule Take 300 mg by mouth 2 (two) times daily.    . HMarland KitchenDROcodone-acetaminophen (NORCO) 10-325 MG per tablet Take 1 tablet by mouth every 6 (six) hours as needed for moderate pain.    . kMarland Kitchentoconazole (NIZORAL) 2 % shampoo Apply 1 application topically 2 (two) times a week. For 4 weeks1    . Lifitegrast (XIIDRA) 5 % SOLN Apply to eye  2 (two) times daily.    . lMarland Kitchensartan-hydrochlorothiazide (HYZAAR) 50-12.5 MG tablet Take 1 tablet by mouth daily.    . lMarland Kitchenteprednol (LOTEMAX) 0.5 % ophthalmic suspension Place 2 drops into both eyes 2 (two) times daily.    . mirtazapine (REMERON) 15 MG tablet Take 1 tablet (15 mg total) by mouth at bedtime. FOR APPETITE    . nystatin (MYCOSTATIN) 100000 UNIT/ML suspension TAKE  5 ML BY MOUTH 4 TIMES DAILY AS NEEDED    .  Probiotic Product (PROBIOTIC DAILY PO) Take by mouth at bedtime.    . SYMBICORT 80-4.5 MCG/ACT inhaler INHALE TWO PUFFS BY MOUTH TWICE DAILY     Review of Systems PER HPI OTHERWISE ALL SYSTEMS ARE NEGATIVE.    Objective:   Physical Exam  Constitutional: She is oriented to person, place, and time. She appears well-developed and well-nourished. No distress.  HENT:  Head: Normocephalic and atraumatic.  Mouth/Throat: Oropharynx is clear and moist. No oropharyngeal exudate.  Eyes: Pupils are equal, round, and reactive to light. No scleral icterus.  Neck: Normal range of motion. Neck supple.  Cardiovascular: Normal rate, regular rhythm and normal heart sounds.   Pulmonary/Chest: Effort normal and breath sounds normal. No respiratory distress.  Abdominal: Soft. Bowel sounds are normal. She exhibits no distension. There is no tenderness.  Musculoskeletal: She exhibits no edema.  Lymphadenopathy:    She has no cervical adenopathy.  Neurological: She is alert and oriented to person, place, and time.  Psychiatric: She has a normal mood and affect.  Vitals reviewed.         Assessment & Plan:

## 2016-02-08 IMAGING — CR DG CHEST 2V
2 series · 2 of 2 positions shown · non-contrast
Comparison: Chest x-ray of 01/11/2014

CLINICAL DATA: History of mold exposure, shortness of breath,
former smoking history

EXAM:
CHEST  2 VIEW

[view not recorded (1 of 2)]
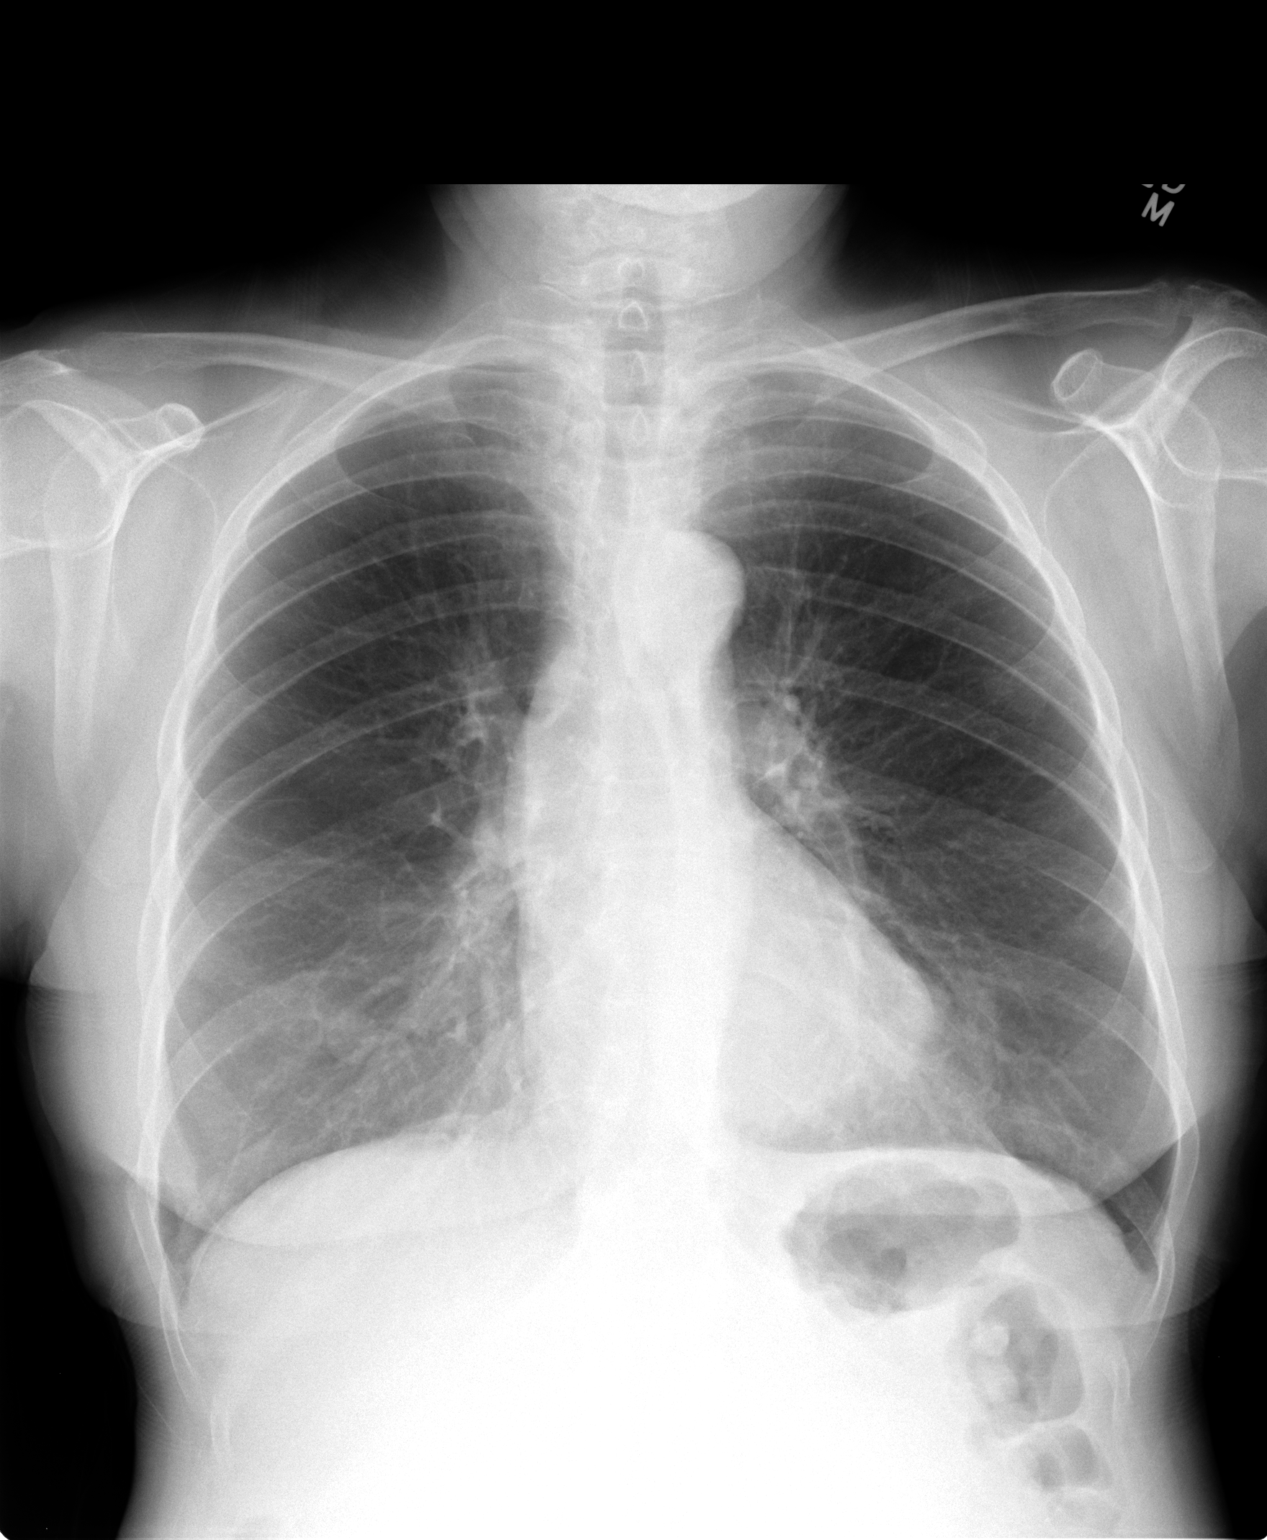

[view not recorded (2 of 2)]
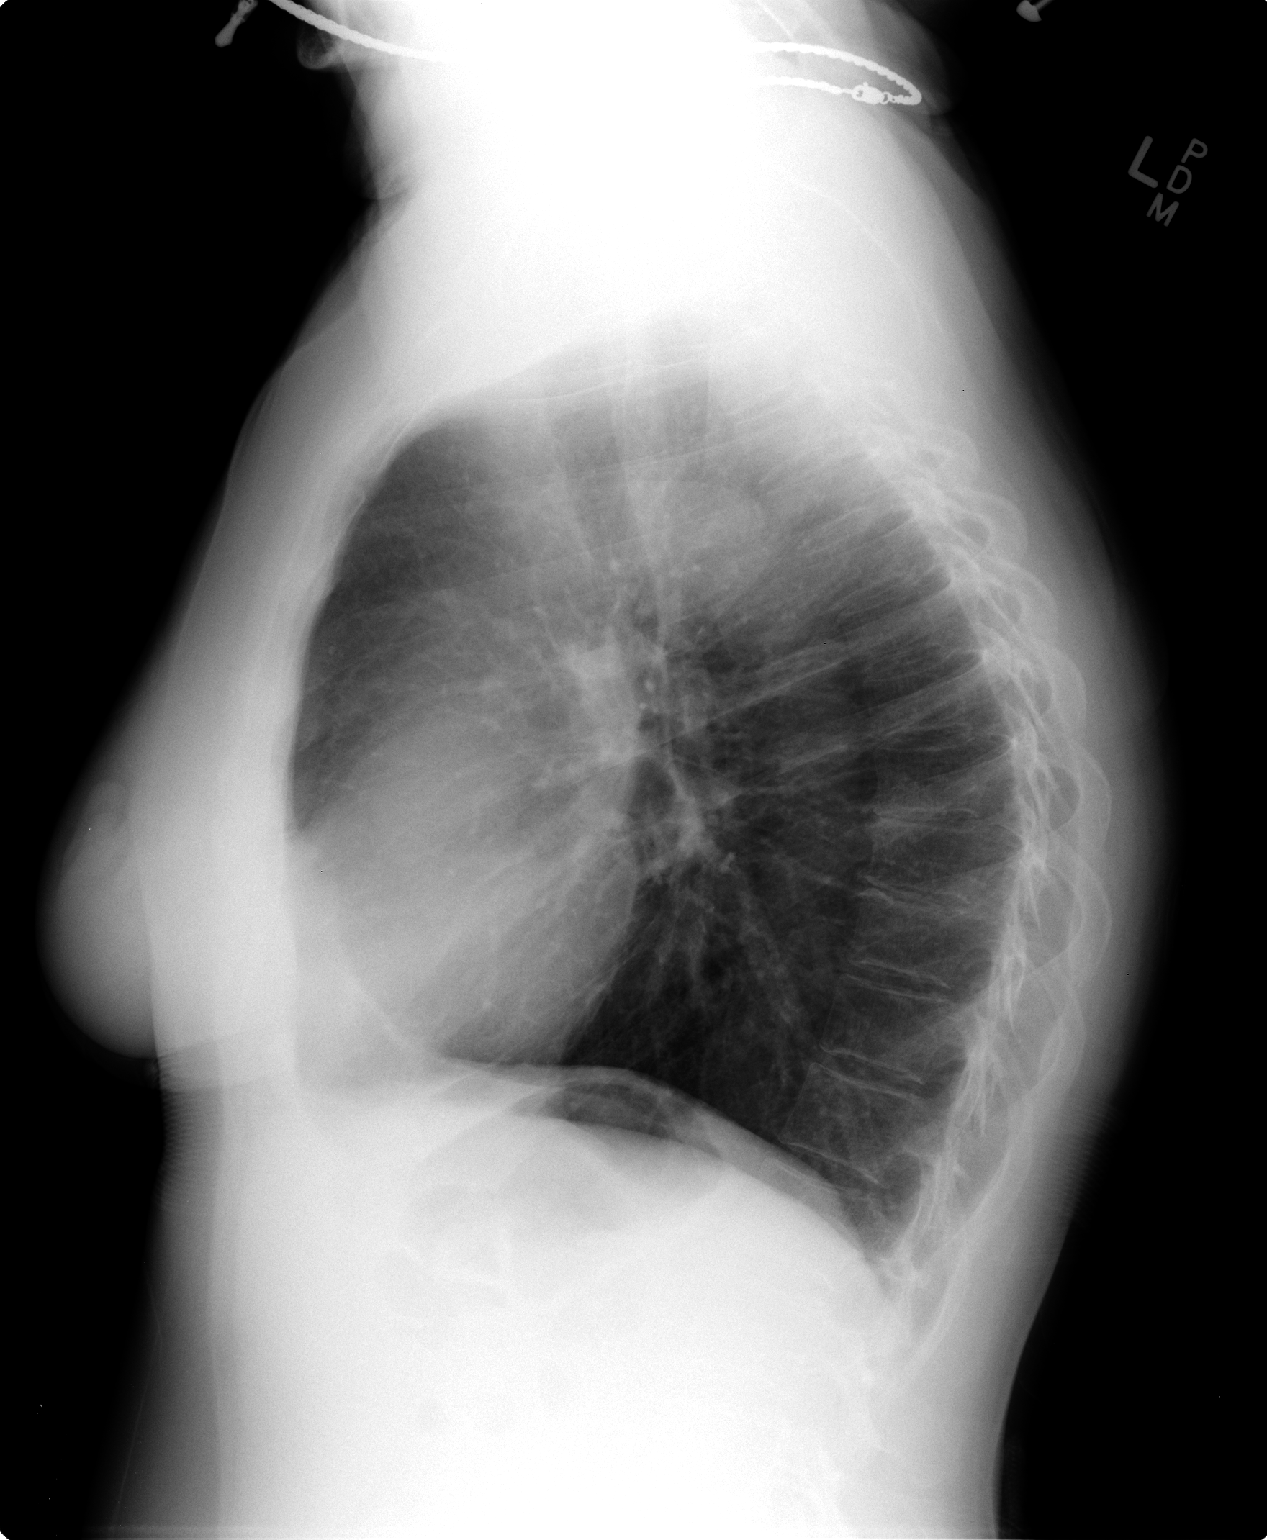

[2 of 2 positions shown; findings below may reference images not displayed]

FINDINGS: The lungs remain slightly hyperaerated. No focal infiltrate or
effusion is seen. Mediastinal and hilar contours are unchanged and
the heart is within normal limits in size. No acute bony abnormality
is seen.
IMPRESSION: No active cardiopulmonary disease.  No change in hyper aeration.

## 2016-02-09 ENCOUNTER — Ambulatory Visit (HOSPITAL_COMMUNITY)
Admission: RE | Admit: 2016-02-09 | Discharge: 2016-02-09 | Disposition: A | Discharge status: Home / Self Care | Payer: Medicare Other | Source: Ambulatory Visit | Attending: Gastroenterology | Admitting: Gastroenterology

## 2016-02-09 ENCOUNTER — Other Ambulatory Visit (HOSPITAL_COMMUNITY)
Admission: RE | Admit: 2016-02-09 | Discharge: 2016-02-09 | Disposition: A | Discharge status: Home / Self Care | Payer: Medicare Other | Source: Ambulatory Visit | Attending: Gastroenterology | Admitting: Gastroenterology

## 2016-02-09 ENCOUNTER — Telehealth: Payer: Self-pay | Admitting: Gastroenterology

## 2016-02-09 DIAGNOSIS — M40294 Other kyphosis, thoracic region: Secondary | ICD-10-CM | POA: Insufficient documentation

## 2016-02-09 DIAGNOSIS — R0602 Shortness of breath: Secondary | ICD-10-CM | POA: Diagnosis not present

## 2016-02-09 DIAGNOSIS — R634 Abnormal weight loss: Secondary | ICD-10-CM | POA: Insufficient documentation

## 2016-02-09 DIAGNOSIS — R0600 Dyspnea, unspecified: Secondary | ICD-10-CM

## 2016-02-09 DIAGNOSIS — M47894 Other spondylosis, thoracic region: Secondary | ICD-10-CM | POA: Insufficient documentation

## 2016-02-09 DIAGNOSIS — R062 Wheezing: Secondary | ICD-10-CM | POA: Diagnosis not present

## 2016-02-09 DIAGNOSIS — E876 Hypokalemia: Secondary | ICD-10-CM

## 2016-02-09 LAB — CBC WITH DIFFERENTIAL/PLATELET
BASOS ABS: 0 10*3/uL (ref 0.0–0.1)
Basophils Relative: 1 %
EOS ABS: 0 10*3/uL (ref 0.0–0.7)
Eosinophils Relative: 1 %
HCT: 40 % (ref 36.0–46.0)
HEMOGLOBIN: 14.7 g/dL (ref 12.0–15.0)
LYMPHS ABS: 0.8 10*3/uL (ref 0.7–4.0)
LYMPHS PCT: 22 %
MCH: 35.9 pg — AB (ref 26.0–34.0)
MCHC: 36.8 g/dL — ABNORMAL HIGH (ref 30.0–36.0)
MCV: 97.8 fL (ref 78.0–100.0)
Monocytes Absolute: 0.4 10*3/uL (ref 0.1–1.0)
Monocytes Relative: 12 %
NEUTROS PCT: 64 %
Neutro Abs: 2.3 10*3/uL (ref 1.7–7.7)
Platelets: 194 10*3/uL (ref 150–400)
RBC: 4.09 MIL/uL (ref 3.87–5.11)
RDW: 12.7 % (ref 11.5–15.5)
WBC: 3.6 10*3/uL — AB (ref 4.0–10.5)

## 2016-02-09 LAB — COMPREHENSIVE METABOLIC PANEL
ALK PHOS: 121 U/L (ref 38–126)
ALT: 144 U/L — AB (ref 14–54)
AST: 220 U/L — AB (ref 15–41)
Albumin: 4.2 g/dL (ref 3.5–5.0)
Anion gap: 14 (ref 5–15)
BUN: 11 mg/dL (ref 6–20)
CALCIUM: 9.4 mg/dL (ref 8.9–10.3)
CHLORIDE: 88 mmol/L — AB (ref 101–111)
CO2: 27 mmol/L (ref 22–32)
CREATININE: 0.53 mg/dL (ref 0.44–1.00)
GFR calc non Af Amer: >60 mL/min (ref >60)
GLUCOSE: 89 mg/dL (ref 65–99)
Potassium: 3 mmol/L — ABNORMAL LOW (ref 3.5–5.1)
SODIUM: 129 mmol/L — AB (ref 135–145)
Total Bilirubin: 1.2 mg/dL (ref 0.3–1.2)
Total Protein: 7.1 g/dL (ref 6.5–8.1)

## 2016-02-09 LAB — VITAMIN B12: Vitamin B-12: 461 pg/mL (ref 180–914)

## 2016-02-09 NOTE — Telephone Encounter (Signed)
Called patient TO DISCUSS RESULTS. HER SODIUM AND K ARE LOW. NEEDS KCL 40 MeQ Q12H FOR 3 DOSES.  STOP TAKING HYZAAR. NO NEW MEDS OR ANYTHING OTC. TYLENOL: NO. NO HERBAL SUPPLEMENTS. DRINKS CONSTANT COMMON TEA. TUES HAS CT SCAN. RECHECK BMP ON TUES.

## 2016-02-10 LAB — HEPATITIS A ANTIBODY, TOTAL: HEP A TOTAL AB: NEGATIVE

## 2016-02-12 LAB — CALCITRIOL (1,25 DI-OH VIT D): VIT D 1 25 DIHYDROXY: 48.7 pg/mL (ref 19.9–79.3)

## 2016-02-12 NOTE — Telephone Encounter (Signed)
Tried to call pt. Could not leave a Vm. Lab order faxed to [facility] lab @ [phone] with a note that pt will be coming tomorrow and she also has a CT scheduled.

## 2016-02-13 ENCOUNTER — Ambulatory Visit (HOSPITAL_COMMUNITY)
Admission: RE | Admit: 2016-02-13 | Discharge: 2016-02-13 | Disposition: A | Discharge status: Home / Self Care | Payer: Medicare Other | Source: Ambulatory Visit | Attending: Gastroenterology | Admitting: Gastroenterology

## 2016-02-13 DIAGNOSIS — K5090 Crohn's disease, unspecified, without complications: Secondary | ICD-10-CM | POA: Diagnosis not present

## 2016-02-13 DIAGNOSIS — R1010 Upper abdominal pain, unspecified: Secondary | ICD-10-CM | POA: Diagnosis not present

## 2016-02-13 DIAGNOSIS — I700 Atherosclerosis of aorta: Secondary | ICD-10-CM | POA: Diagnosis not present

## 2016-02-13 DIAGNOSIS — R109 Unspecified abdominal pain: Secondary | ICD-10-CM | POA: Insufficient documentation

## 2016-02-13 DIAGNOSIS — R634 Abnormal weight loss: Secondary | ICD-10-CM | POA: Insufficient documentation

## 2016-02-13 MED ORDER — IOPAMIDOL (ISOVUE-300) INJECTION 61%
100.0000 mL | Freq: Once | INTRAVENOUS | Status: AC | PRN
  Administered 2016-02-13: 100 mL via INTRAVENOUS

## 2016-02-13 NOTE — Patient Instructions (Signed)
[name] [name]  02/13/2016     @PREFPERIOPPHARMACY @   Your procedure is scheduled on 02/20/2016.  Report to [facility] at 8;00 A.M.  Call this number if you have problems the morning of surgery:  [phone]   Remember:  Do not eat food or drink liquids after midnight.  Take these medicines the morning of surgery with A SIP OF WATER : NORVASC, FLEXERIL, VALIUM, NEURONTIN, [place].  PLEASE USE YOUR INHALERS BEFORE LEAVING HOME AND BRING IT WITH YOU TO THE HOSPITAL.   Do not wear jewelry, make-up or nail polish.  Do not wear lotions, powders, or perfumes, or deoderant.  Do not shave 48 hours prior to surgery.  Men may shave face and neck.  Do not bring valuables to the hospital.  [facility] is not responsible for any belongings or valuables.  Contacts, dentures or bridgework may not be worn into surgery.  Leave your suitcase in the car.  After surgery it may be brought to your room.  For patients admitted to the hospital, discharge time will be determined by your treatment team.  Patients discharged the day of surgery will not be allowed to drive home.   Name and phone number of your driver:   FAMILY Special instructions:  N/A  Please read over the following fact sheets that you were given. Care and Recovery After Surgery   Esophagogastroduodenoscopy Esophagogastroduodenoscopy (EGD) is a procedure that is used to examine the lining of the esophagus, stomach, and first part of the small intestine (duodenum). A long, flexible, lighted tube with a camera attached (endoscope) is inserted down the throat to view these organs. This procedure is done to detect problems or abnormalities, such as inflammation, bleeding, ulcers, or growths, in order to treat them. The procedure lasts 5-20 minutes. It is usually an outpatient procedure, but it may need to be performed in a hospital in emergency cases. LET [facility] CARE PROVIDER KNOW ABOUT:  Any allergies you have.  All  medicines you are taking, including vitamins, herbs, eye drops, creams, and over-the-counter medicines.  Previous problems you or members of your family have had with the use of anesthetics.  Any blood disorders you have.  Previous surgeries you have had.  Medical conditions you have. RISKS AND COMPLICATIONS Generally, this is a safe procedure. However, problems can occur and include:  Infection.  Bleeding.  Tearing (perforation) of the esophagus, stomach, or duodenum.  Difficulty breathing or not being able to breathe.  Excessive sweating.  Spasms of the larynx.  Slowed heartbeat.  Low blood pressure. BEFORE THE PROCEDURE  Do not eat or drink anything after midnight on the night before the procedure or as directed by your health care provider.  Do not take your regular medicines before the procedure if your health care provider asks you not to. Ask your health care provider about changing or stopping those medicines.  If you wear dentures, be prepared to remove them before the procedure.  Arrange for someone to drive you home after the procedure. PROCEDURE  A numbing medicine (local anesthetic) may be sprayed in your throat for comfort and to stop you from gagging or coughing.  You will have an IV tube inserted in a vein in your hand or arm. You will receive medicines and fluids through this tube.  You will be given a medicine to relax you (sedative).  A pain reliever will be given through the IV tube.  A mouth guard may be placed in  your mouth to protect your teeth and to keep you from biting on the endoscope.  You will be asked to lie on your left side.  The endoscope will be inserted down your throat and into your esophagus, stomach, and duodenum.  Air will be put through the endoscope to allow your health care provider to clearly view the lining of your esophagus.  The lining of your esophagus, stomach, and duodenum will be examined. During the exam, your  health care provider may:  Remove tissue to be examined under a microscope (biopsy) for inflammation, infection, or other medical problems.  Remove growths.  Remove objects (foreign bodies) that are stuck.  Treat any bleeding with medicines or other devices that stop tissues from bleeding (hot cautery, clipping devices).  Widen (dilate) or stretch narrowed areas of your esophagus and stomach.  The endoscope will be withdrawn. AFTER THE PROCEDURE  You will be taken to a recovery area for observation. Your blood pressure, heart rate, breathing rate, and blood oxygen level will be monitored often until the medicines you were given have worn off.  Do not eat or drink anything until the numbing medicine has worn off and your gag reflex has returned. You may choke.  Your health care provider should be able to discuss his or her findings with you. It will take longer to discuss the test results if any biopsies were taken.   This information is not intended to replace advice given to you by your health care provider. Make sure you discuss any questions you have with your health care provider.   Document Released: 08/23/2004 Document Revised: 05/13/2014 Document Reviewed: 03/25/2012 Elsevier Interactive Patient Education [employer].

## 2016-02-14 ENCOUNTER — Encounter (HOSPITAL_COMMUNITY): Payer: Self-pay

## 2016-02-14 ENCOUNTER — Encounter (HOSPITAL_COMMUNITY)
Admission: RE | Admit: 2016-02-14 | Discharge: 2016-02-14 | Disposition: A | Discharge status: Home / Self Care | Payer: Medicare Other | Source: Ambulatory Visit | Attending: Gastroenterology | Admitting: Gastroenterology

## 2016-02-14 DIAGNOSIS — Z01810 Encounter for preprocedural cardiovascular examination: Secondary | ICD-10-CM | POA: Insufficient documentation

## 2016-02-14 DIAGNOSIS — Z01812 Encounter for preprocedural laboratory examination: Secondary | ICD-10-CM | POA: Diagnosis not present

## 2016-02-14 LAB — BASIC METABOLIC PANEL
ANION GAP: 9 (ref 5–15)
BUN: 5 mg/dL — ABNORMAL LOW (ref 6–20)
CALCIUM: 9 mg/dL (ref 8.9–10.3)
CO2: 26 mmol/L (ref 22–32)
Chloride: 99 mmol/L — ABNORMAL LOW (ref 101–111)
Creatinine, Ser: 0.36 mg/dL — ABNORMAL LOW (ref 0.44–1.00)
Glucose, Bld: 89 mg/dL (ref 65–99)
Potassium: 3.7 mmol/L (ref 3.5–5.1)
Sodium: 134 mmol/L — ABNORMAL LOW (ref 135–145)

## 2016-02-20 ENCOUNTER — Inpatient Hospital Stay (HOSPITAL_COMMUNITY): Admission: RE | Admit: 2016-02-20 | Payer: Medicare Other | Source: Ambulatory Visit

## 2016-02-20 ENCOUNTER — Ambulatory Visit (HOSPITAL_COMMUNITY): Payer: Medicare Other | Admitting: Anesthesiology

## 2016-02-20 ENCOUNTER — Encounter (HOSPITAL_COMMUNITY)
Admission: RE | Disposition: A | Discharge status: Home / Self Care | Payer: Self-pay | Source: Ambulatory Visit | Attending: Gastroenterology

## 2016-02-20 ENCOUNTER — Ambulatory Visit (HOSPITAL_COMMUNITY)
Admission: RE | Admit: 2016-02-20 | Discharge: 2016-02-20 | Disposition: A | Discharge status: Home / Self Care | Payer: Medicare Other | Source: Ambulatory Visit | Attending: Gastroenterology | Admitting: Gastroenterology

## 2016-02-20 ENCOUNTER — Encounter (HOSPITAL_COMMUNITY): Payer: Self-pay | Admitting: Anesthesiology

## 2016-02-20 DIAGNOSIS — G43909 Migraine, unspecified, not intractable, without status migrainosus: Secondary | ICD-10-CM | POA: Insufficient documentation

## 2016-02-20 DIAGNOSIS — Z7951 Long term (current) use of inhaled steroids: Secondary | ICD-10-CM | POA: Insufficient documentation

## 2016-02-20 DIAGNOSIS — Z87891 Personal history of nicotine dependence: Secondary | ICD-10-CM | POA: Insufficient documentation

## 2016-02-20 DIAGNOSIS — R12 Heartburn: Secondary | ICD-10-CM | POA: Diagnosis not present

## 2016-02-20 DIAGNOSIS — I10 Essential (primary) hypertension: Secondary | ICD-10-CM | POA: Insufficient documentation

## 2016-02-20 DIAGNOSIS — F419 Anxiety disorder, unspecified: Secondary | ICD-10-CM | POA: Diagnosis not present

## 2016-02-20 DIAGNOSIS — K319 Disease of stomach and duodenum, unspecified: Secondary | ICD-10-CM | POA: Diagnosis not present

## 2016-02-20 DIAGNOSIS — K222 Esophageal obstruction: Secondary | ICD-10-CM | POA: Diagnosis not present

## 2016-02-20 DIAGNOSIS — E785 Hyperlipidemia, unspecified: Secondary | ICD-10-CM | POA: Insufficient documentation

## 2016-02-20 DIAGNOSIS — K2970 Gastritis, unspecified, without bleeding: Secondary | ICD-10-CM

## 2016-02-20 DIAGNOSIS — R748 Abnormal levels of other serum enzymes: Secondary | ICD-10-CM | POA: Diagnosis not present

## 2016-02-20 DIAGNOSIS — R1310 Dysphagia, unspecified: Secondary | ICD-10-CM

## 2016-02-20 DIAGNOSIS — K219 Gastro-esophageal reflux disease without esophagitis: Secondary | ICD-10-CM | POA: Diagnosis not present

## 2016-02-20 DIAGNOSIS — Z79899 Other long term (current) drug therapy: Secondary | ICD-10-CM | POA: Insufficient documentation

## 2016-02-20 DIAGNOSIS — J449 Chronic obstructive pulmonary disease, unspecified: Secondary | ICD-10-CM | POA: Insufficient documentation

## 2016-02-20 DIAGNOSIS — R634 Abnormal weight loss: Secondary | ICD-10-CM | POA: Diagnosis not present

## 2016-02-20 HISTORY — PX: ESOPHAGOGASTRODUODENOSCOPY (EGD) WITH PROPOFOL: SHX5813

## 2016-02-20 HISTORY — PX: SAVORY DILATION: SHX5439

## 2016-02-20 LAB — TRANSFERRIN: TRANSFERRIN: 210 mg/dL (ref 192–382)

## 2016-02-20 LAB — FERRITIN: Ferritin: 378 ng/mL — ABNORMAL HIGH (ref 11–307)

## 2016-02-20 SURGERY — ESOPHAGOGASTRODUODENOSCOPY (EGD) WITH PROPOFOL
Anesthesia: Monitor Anesthesia Care

## 2016-02-20 MED ORDER — DEXLANSOPRAZOLE 60 MG PO CPDR: DELAYED_RELEASE_CAPSULE | ORAL | 11 refills | Status: DC

## 2016-02-20 MED ORDER — LACTATED RINGERS IV SOLN
INTRAVENOUS | Status: DC
  Administered 2016-02-20: 1000 mL via INTRAVENOUS

## 2016-02-20 MED ORDER — CHLORHEXIDINE GLUCONATE CLOTH 2 % EX PADS
6.0000 | MEDICATED_PAD | Freq: Once | CUTANEOUS | Status: DC
Start: 2016-02-20 — End: 2016-02-20

## 2016-02-20 MED ORDER — LIDOCAINE VISCOUS 2 % MT SOLN
OROMUCOSAL | Status: AC
  Filled 2016-02-20: qty 15

## 2016-02-20 MED ORDER — LIDOCAINE VISCOUS 2 % MT SOLN
6.0000 mL | Freq: Once | OROMUCOSAL | Status: AC
  Administered 2016-02-20: 6 mL via OROMUCOSAL

## 2016-02-20 MED ORDER — FENTANYL CITRATE (PF) 100 MCG/2ML IJ SOLN: 25.0000 ug | INTRAMUSCULAR | Status: DC | PRN

## 2016-02-20 MED ORDER — PROPOFOL 10 MG/ML IV BOLUS
INTRAVENOUS | Status: AC
  Filled 2016-02-20: qty 20

## 2016-02-20 MED ORDER — MIDAZOLAM HCL 2 MG/2ML IJ SOLN
INTRAMUSCULAR | Status: AC
  Filled 2016-02-20: qty 2

## 2016-02-20 MED ORDER — PROPOFOL 500 MG/50ML IV EMUL
INTRAVENOUS | Status: DC | PRN
  Administered 2016-02-20: 125 ug/kg/min via INTRAVENOUS

## 2016-02-20 MED ORDER — ONDANSETRON HCL 4 MG/2ML IJ SOLN: 4.0000 mg | Freq: Four times a day (QID) | INTRAMUSCULAR | Status: DC | PRN

## 2016-02-20 MED ORDER — MIDAZOLAM HCL 5 MG/5ML IJ SOLN
INTRAMUSCULAR | Status: DC | PRN
  Administered 2016-02-20 (×2): 2 mg via INTRAVENOUS

## 2016-02-20 MED ORDER — PROPOFOL 10 MG/ML IV BOLUS
INTRAVENOUS | Status: DC | PRN
  Administered 2016-02-20: 10 mg via INTRAVENOUS

## 2016-02-20 MED ORDER — LACTATED RINGERS IV SOLN
INTRAVENOUS | Status: DC | PRN
  Administered 2016-02-20: 09:00:00 via INTRAVENOUS

## 2016-02-20 MED ORDER — OXYCODONE HCL 5 MG/5ML PO SOLN
5.0000 mg | Freq: Once | ORAL | Status: AC | PRN
  Filled 2016-02-20: qty 5

## 2016-02-20 MED ORDER — CHLORHEXIDINE GLUCONATE CLOTH 2 % EX PADS: 6.0000 | MEDICATED_PAD | Freq: Once | CUTANEOUS | Status: DC

## 2016-02-20 MED ORDER — OXYCODONE HCL 5 MG PO TABS
5.0000 mg | ORAL_TABLET | Freq: Once | ORAL | Status: AC | PRN
  Administered 2016-02-20: 5 mg via ORAL
  Filled 2016-02-20: qty 1

## 2016-02-20 NOTE — Anesthesia Postprocedure Evaluation (Signed)
Anesthesia Post Note  Patient: [name] [name]  Procedure(s) Performed: Procedure(s) (LRB): ESOPHAGOGASTRODUODENOSCOPY (EGD) WITH PROPOFOL (N/A) SAVORY DILATION (N/A)  Patient location during evaluation: PACU Anesthesia Type: MAC Level of consciousness: awake and alert and oriented Pain management: pain level controlled Vital Signs Assessment: post-procedure vital signs reviewed and stable Respiratory status: spontaneous breathing Cardiovascular status: blood pressure returned to baseline Postop Assessment: no signs of nausea or vomiting Anesthetic complications: no    Last Vitals:  Vitals:   02/20/16 0830  BP: 130/85  Resp: 15  Temp: 36.7 C    Last Pain: There were no vitals filed for this visit.               Hang Ammon

## 2016-02-20 NOTE — H&P (View-Only) (Signed)
Subjective:    Patient ID: [name] [name], female    DOB: 1955-10-07, 60 y.o.   MRN: [id number]  [name] Blackbird, MD  HPI HAVING TROUBLE WITH BELLY FOR QUITE A WHILE. SINCE SHE HAD HER TOOTH SURGERY. HAD PROBLEM WITH HEALING UP AND GOTTEN WORSE IN PAST WEEK OR TWO. HURTS IN HER EPIGASTRIUM(ACHY) AND MIDDLE OF CHEST. MAU RADIATE TO RIGHT BACK. BAD HEARTBURN. GETS UP IN THE AMA DN IT ACHES AND THROBS. NO ASPIRIN, BC/GOODY POWDERS, IBUPROFEN/MOTRIN, OR NAPROXEN/ALEVE. SAW BLOOD IN VOMIT ONCE-A LITTLE BIT. PAIN WHEN SHE SWALLOWS. HARD TO TAKE PILLS. HASN'T BEEN EATING BECAUSE SHE THROWS UP. BMS: NONE REALLY. USUALLY NL IF IT DOES COMES OUT. BUT BEEN DIARRHEA SINCE SUN(OFF AND ON NOT A LOT).  NO BLOOD WORK OR IMAGING. SOB NO WORSE SINCE PAIN STARTED AND CAN GET TO WHEEZING. LAST VOMITED YESTERDAY MORNING. COUGHING A LOT MORE THAN IN THE PAST. COUGHS AND COUGHS AND COUGHS THEN THROWS UP AND GETS EXCRUCIATING IN THE MIDDLE OF HER CHEST. MOST OF TIME VOMITING IN THE AM. PHELGM IN THE BACK OF HER THROAT WHEN SHE WAKES UP. WHEN SHE EATS SHE THROWS UP AND DEPENDS ON WHAT SHE EATS THROUGH OUT THE DAY. VOMITING BROUGHT ON BY COUGHING AND FOOD. COUGH UP MUCOUS: YELLOW. OCCASIONALLY FEELS LIKE FOOD OR PILLS GET STUCK: ALL THE TIME. WAS ON ABX FOR APR 2017 UNTIL LAST ABX SEP 2017. NO SMOKING. QUIT 1/2 PK/DAY AND STOPPED OVER 6 MOS. HAD SMOKED FOR AT LEAST 74YRS.   PT DENIES FEVER, CHILLS, DYSURIA, HEMATURIA, HEMATOCHEZIA,  nausea, vomiting, melena, diarrhea, CHANGE IN BOWEL IN HABITS, constipation, OR problems with sedation.    Past Medical History:  Diagnosis Date  . Allergic rhinitis   . Anastomotic ulcer JAN 2009  . Anxiety   . Asthma   . BMI (body mass index) 20.0-29.9 2009 121 lbs  . 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 Crohn disease ([place])   . Crohn disease ([place])   . CTS (carpal tunnel syndrome)   . Diarrhea MULITIFACTORIAL   IBS, LACTOSE INTOLERANCE, SBBO, BILE-SALT  . Elevated liver enzymes  2009: BMI 24 ?Etoh ALT 94, AST 51,  NEG ANA, qIGs& ASMA   MAY 2012 AST 52 ALT 38  . Esophageal stricture 2009  . GERD (gastroesophageal reflux disease)   . Hemorrhoid   . Hyperlipemia   . Hypertension   . Inflammatory bowel disease 2006 CD   SURGICAL REMISSION  . LBP (low back pain)   . Migraine   . PONV (postoperative nausea and vomiting)   . Shortness of breath    exertion and humidity  . Sleep apnea    Stop [name] [name] score of 4    Past Surgical History:  Procedure Laterality Date  . BACK SURGERY    . BILATERAL SALPINGOOPHORECTOMY    . BIOPSY  12/24/2011   Procedure: BIOPSY;  Surgeon: Danie Binder, MD;  Location: [facility];  Service: Endoscopy;;  Gastric Biopsies  . BIOPSY N/A 06/30/2012   Procedure: BIOPSY;  Surgeon: Danie Binder, MD;  Location: [facility];  Service: Endoscopy;  Laterality: N/A;  Gastric and Esophageal Biopsies  . CARPAL TUNNEL RELEASE  LEFT  . COLON SURGERY    . COLONOSCOPY  JAN 2009 DIARRHEA, ABD PAIN, BRBPR   ANASTOMOTIC ULCER(3MM), IH YH:CWCBJS ULCER, NL COLON bX  . DILATION AND CURETTAGE OF UTERUS    . ESOPHAGEAL DILATION  12/24/2011   SLF: A stricture was found in the distal esophagus/  Moderate gastritis/ MILD Duodenitis  . ESOPHAGOGASTRODUODENOSCOPY  02/07/09   mild gastritis  . ESOPHAGOGASTRODUODENOSCOPY (EGD) WITH PROPOFOL N/A 06/30/2012   SLF: 1. No definite stricture appreciated 2. small hiatal hernia 3. Gastritis  . HEMICOLECTOMY  RIGHT 2006  . HERNIA REPAIR    . LUMBAR DISC SURGERY    . SAVORY DILATION N/A 06/30/2012   Procedure: SAVORY DILATION;  Surgeon: Danie Binder, MD;  Location: [facility];  Service: Endoscopy;  Laterality: N/A;  12.72m , 159m 1536m. UPPER GASTROINTESTINAL ENDOSCOPY  JAN 2009 ABD PAIN   GASTRITIS, ESO RING  . UPPER GASTROINTESTINAL ENDOSCOPY  OCT 2010 ABD PAIN, DYSPHAGIA   DIL 15MM, GASTRITIS, NL DUODENUM  . UPPER GASTROINTESTINAL ENDOSCOPY  OCT 2010 DYSPHAGIA   DIL 17 MM, GASTRITIS, NL DUODENUM  . vocal cord surgery  Sep 20, 2010   precancerous areas removed  . wisdom tooth extraction     pin placement due to broken jaw   Allergies  Allergen Reactions  . Lovastatin Other (See Comments)    Chest pain  . Naproxen Other (See Comments)    Chest pain/ SOB   . Nexium [Esomeprazole Magnesium] Diarrhea    Abdominal pain    Current Outpatient Prescriptions  Medication Sig Dispense Refill  . albuterol (VENTOLIN HFA) 108 (90 Base) MCG/ACT inhaler INHALE TWO PUFFS BY MOUTH EVERY 4 HOURS AS NEEDED FOR WHEEZING    . amLODipine (NORVASC) 5 MG tablet Take 1 tablet (5 mg total) by mouth daily.    . butalbital-acetaminophen-caffeine (FIORICET/CODEINE) 50-325-40-30 MG capsule Take 1 capsule by mouth every 4 (four) hours as needed for headache.    . calcium carbonate (TUMS - DOSED IN MG ELEMENTAL CALCIUM) 500 MG chewable tablet Chew 1-2 tablets by mouth 2 (two) times daily as needed for indigestion or heartburn.    . cholestyramine (QUESTRAN) 4 GM/DOSE powder DISSOLVE AND TAKE 2 GRAMS BY MOUTH TWICE DAILY WITH A MEAL - DO NOT TAKE WITHIN 2 HOURS OF OTHER MEDICATIONS    . cyclobenzaprine (FLEXERIL) 5 MG tablet Take 5 mg by mouth 2 (two) times daily as needed for muscle spasms.     . diazepam (VALIUM) 5 MG tablet TAKE ONE TABLET BY MOUTH EVERY 12 HOURS AS NEEDED FOR ANXIETY **REFILLS MUST BE 30 DAYS APART**    . fluorometholone (FML) 0.1 % ophthalmic ointment 1 application 4 (four) times daily.    . fluticasone (CUTIVATE) 0.05 % cream     . fluticasone (FLONASE) 50 MCG/ACT nasal spray USE TWO SPRAY(S) IN EACH NOSTRIL ONCE DAILY AS NEEDED FOR ALLERGY OR RHINITIS    . gabapentin (NEURONTIN) 300 MG capsule Take 300 mg by mouth 2 (two) times daily.    . HMarland KitchenDROcodone-acetaminophen (NORCO) 10-325 MG per tablet Take 1 tablet by mouth every 6 (six) hours as needed for moderate pain.    . kMarland Kitchentoconazole (NIZORAL) 2 % shampoo Apply 1 application topically 2 (two) times a week. For 4 weeks1    . Lifitegrast (XIIDRA) 5 % SOLN Apply to eye  2 (two) times daily.    . lMarland Kitchensartan-hydrochlorothiazide (HYZAAR) 50-12.5 MG tablet Take 1 tablet by mouth daily.    . lMarland Kitchenteprednol (LOTEMAX) 0.5 % ophthalmic suspension Place 2 drops into both eyes 2 (two) times daily.    . mirtazapine (REMERON) 15 MG tablet Take 1 tablet (15 mg total) by mouth at bedtime. FOR APPETITE    . nystatin (MYCOSTATIN) 100000 UNIT/ML suspension TAKE  5 ML BY MOUTH 4 TIMES DAILY AS NEEDED    .  Probiotic Product (PROBIOTIC DAILY PO) Take by mouth at bedtime.    . SYMBICORT 80-4.5 MCG/ACT inhaler INHALE TWO PUFFS BY MOUTH TWICE DAILY     Review of Systems PER HPI OTHERWISE ALL SYSTEMS ARE NEGATIVE.    Objective:   Physical Exam  Constitutional: She is oriented to person, place, and time. She appears well-developed and well-nourished. No distress.  HENT:  Head: Normocephalic and atraumatic.  Mouth/Throat: Oropharynx is clear and moist. No oropharyngeal exudate.  Eyes: Pupils are equal, round, and reactive to light. No scleral icterus.  Neck: Normal range of motion. Neck supple.  Cardiovascular: Normal rate, regular rhythm and normal heart sounds.   Pulmonary/Chest: Effort normal and breath sounds normal. No respiratory distress.  Abdominal: Soft. Bowel sounds are normal. She exhibits no distension. There is no tenderness.  Musculoskeletal: She exhibits no edema.  Lymphadenopathy:    She has no cervical adenopathy.  Neurological: She is alert and oriented to person, place, and time.  Psychiatric: She has a normal mood and affect.  Vitals reviewed.         Assessment & Plan:

## 2016-02-20 NOTE — Op Note (Signed)
[facility] Patient Name: [name] [name] Procedure Date: 02/20/2016 10:38 AM MRN: [id number] Date of Birth: October 06, 1955 Attending MD: Barney Drain , MD CSN: [id number] Age: 60 Admit Type: Outpatient Procedure:                Upper GI endoscopy WITH COLD FORCEPS                            BIOPSY/ESOPHAGEAL DILATION Indications:              Dysphagia, Heartburn-PT DRINKS ETOH. ELEVATED LIVER                            ENZYMES SINCE 2011. OCT 2017: PLT CT NL. CT                            ABD/PELVIS OCT 2017: NO CIRRHOSIS. 2012: A1AT                            LEVEL-NL. Providers:                Barney Drain, MD, Lurline Del, RN, Randa Spike,                            Technician Referring MD:             Modena Nunnery.  Medicines:                Propofol per Anesthesia Complications:            No immediate complications. Estimated Blood Loss:     Estimated blood loss was minimal. Procedure:                Pre-Anesthesia Assessment:                           - Prior to the procedure, a History and Physical                            was performed, and patient medications and                            allergies were reviewed. The patient's tolerance of                            previous anesthesia was also reviewed. The risks                            and benefits of the procedure and the sedation                            options and risks were discussed with the patient.                            All questions were answered, and informed consent  was obtained. Prior Anticoagulants: The patient has                            taken no previous anticoagulant or antiplatelet                            agents. ASA Grade Assessment: II - A patient with                            mild systemic disease. After reviewing the risks                            and benefits, the patient was deemed in                            satisfactory condition to undergo  the procedure.                            After obtaining informed consent, the endoscope was                            passed under direct vision. Throughout the                            procedure, the patient's blood pressure, pulse, and                            oxygen saturations were monitored continuously. The                            [id number] ([id number]) scope was introduced through the                            mouth, and advanced to the second part of duodenum.                            The upper GI endoscopy was accomplished without                            difficulty. The patient tolerated the procedure                            well. Scope In: 10:55:04 AM Scope Out: 11:07:15 AM Total Procedure Duration: 0 hours 12 minutes 11 seconds  Findings:      One moderate (circumferential scarring or stenosis; an endoscope may       pass) benign-appearing, intrinsic stenosis was found. And was traversed.       A guidewire was placed and the scope was withdrawn. Dilation was       performed with a Savary dilator with mild resistance at 12.8 mm, 14 mm,       15 mm, 16 mm and 17 mm(TRACE HEME AFTER DILATION).      Patchy moderate inflammation characterized by congestion (edema) and       erythema was found in  the gastric antrum. Biopsies were taken with a       cold forceps for Helicobacter pylori testing.      The examined duodenum was normal. Impression:               - DYSPHAGIA DUE TO PEPTIC STRICTURE                           - MODERATE Gastritis. Moderate Sedation:      Per Anesthesia Care Recommendation:           - Low fat diet.                           - Use Dexilant (dexlansoprazole) 60 mg PO daily.                           - Await pathology results. DRAW LABS TO EVALUATE                            ELEVATED LIVER ENZYMES TODAY: ANA, ASMA, QUANT IgG,                            CERULOPLASMIN, HEP C AbFERRITIN AND TRANSFERRIN.                           - Return to my  office in 4 months.                           - PT NEEDS TWINRIX.                           - Patient has a contact number available for                            emergencies. The signs and symptoms of potential                            delayed complications were discussed with the                            patient. Return to normal activities tomorrow.                            Written discharge instructions were provided to the                            patient.                           - Post procedure medication instructions were                            provided to the patient. Procedure Code(s):        --- Professional ---  [phone], Esophagogastroduodenoscopy, flexible,                            transoral; with insertion of guide wire followed by                            passage of dilator(s) through esophagus over guide                            wire                           43239, Esophagogastroduodenoscopy, flexible,                            transoral; with biopsy, single or multiple Diagnosis Code(s):        --- Professional ---                           K22.2, Esophageal obstruction                           K29.70, Gastritis, unspecified, without bleeding                           R13.10, Dysphagia, unspecified                           R12, Heartburn CPT copyright 2016 American Medical Association. All rights reserved. The codes documented in this report are preliminary and upon coder review may  be revised to meet current compliance requirements. Barney Drain, MD Barney Drain, MD 02/20/2016 11:56:33 AM This report has been signed electronically. Number of Addenda: 0

## 2016-02-20 NOTE — Transfer of Care (Signed)
Immediate Anesthesia Transfer of Care Note  Patient: [name] [name]  Procedure(s) Performed: Procedure(s) with comments: ESOPHAGOGASTRODUODENOSCOPY (EGD) WITH PROPOFOL (N/A) - 930 SAVORY DILATION (N/A)  Patient Location: PACU  Anesthesia Type:MAC  Level of Consciousness: awake and alert   Airway & Oxygen Therapy: Patient Spontanous Breathing and Patient connected to nasal cannula oxygen  Post-op Assessment: Report given to RN  Post vital signs: Reviewed and stable  Last Vitals:  Vitals:   02/20/16 0830  BP: 130/85  Resp: 15  Temp: 36.7 C    Last Pain: There were no vitals filed for this visit.    Patients Stated Pain Goal: 5 (50/35/46 5681)  Complications: No apparent anesthesia complications

## 2016-02-20 NOTE — Anesthesia Preprocedure Evaluation (Signed)
Anesthesia Evaluation  Patient identified by MRN, date of birth, ID band Patient awake    Reviewed: Allergy & Precautions, NPO status , Patient's Chart, lab work & pertinent test results  History of Anesthesia Complications (+) PONV and history of anesthetic complications  Airway Mallampati: II   Neck ROM: full    Dental   Pulmonary asthma , sleep apnea , former smoker,    breath sounds clear to auscultation       Cardiovascular hypertension,  Rhythm:regular Rate:Normal     Neuro/Psych  Headaches, PSYCHIATRIC DISORDERS Anxiety Depression    GI/Hepatic PUD, GERD  ,Elevated LFTs   Endo/Other    Renal/GU      Musculoskeletal  (+) Arthritis ,   Abdominal   Peds  Hematology   Anesthesia Other Findings   Reproductive/Obstetrics                             Anesthesia Physical Anesthesia Plan  ASA: III  Anesthesia Plan: MAC   Post-op Pain Management:    Induction: Intravenous  Airway Management Planned: Nasal Cannula  Additional Equipment:   Intra-op Plan:   Post-operative Plan:   Informed Consent: I have reviewed the patients History and Physical, chart, labs and discussed the procedure including the risks, benefits and alternatives for the proposed anesthesia with the patient or authorized representative who has indicated his/her understanding and acceptance.     Plan Discussed with: CRNA, Anesthesiologist and Surgeon  Anesthesia Plan Comments:         Anesthesia Quick Evaluation

## 2016-02-20 NOTE — Discharge Instructions (Signed)
I dilated your esophagus. You have a stricture near the base of your esophagus.  You have gastritis. I biopsied your stomach.   DRINK WATER TO KEEP YOUR URINE LIGHT YELLOW.  FOLLOW A LOW FAT DIET.  MEATS SHOULD BE CHOPPED OR GROUND ONLY. AVOID FRIED FOODS. SEE INFO BELOW.  CONTINUE DEXILANT.  YOUR BIOPSY RESULTS WILL BE AVAILABLE IN MY CHART OCT 20 AND OR MY OFFICE WILL CONTACT YOU IN 10-14 DAYS WITH YOUR RESULTS.   You need the HEPATITIS A/B VACCINE(TWINRIX). YOU CAN GET THAT AT [facility].  FOLLOW UP IN FEB 2018.  UPPER ENDOSCOPY AFTER CARE Read the instructions outlined below and refer to this sheet in the next week. These discharge instructions provide you with general information on caring for yourself after you leave the hospital. While your treatment has been planned according to the most current medical practices available, unavoidable complications occasionally occur. If you have any problems or questions after discharge, call DR. Raia Amico, [phone].  ACTIVITY  You may resume your regular activity, but move at a slower pace for the next 24 hours.   Take frequent rest periods for the next 24 hours.   Walking will help get rid of the air and reduce the bloated feeling in your belly (abdomen).   No driving for 24 hours (because of the medicine (anesthesia) used during the test).   You may shower.   Do not sign any important legal documents or operate any machinery for 24 hours (because of the anesthesia used during the test).    NUTRITION  Drink plenty of fluids.   You may resume your normal diet as instructed by your doctor.   Begin with a light meal and progress to your normal diet. Heavy or fried foods are harder to digest and may make you feel sick to your stomach (nauseated).   Avoid alcoholic beverages for 24 hours or as instructed.    MEDICATIONS  You may resume your normal medications.   WHAT YOU CAN EXPECT TODAY  Some feelings of bloating in the  abdomen.   Passage of more gas than usual.    IF YOU HAD A BIOPSY TAKEN DURING THE UPPER ENDOSCOPY:  Eat a soft diet IF YOU HAVE NAUSEA, BLOATING, ABDOMINAL PAIN, OR VOMITING.    FINDING OUT THE RESULTS OF YOUR TEST Not all test results are available during your visit. DR. Oneida Alar WILL CALL YOU WITHIN 14 DAYS OF YOUR PROCEDUE WITH YOUR RESULTS. Do not assume everything is normal if you have not heard from DR. Sohana Austell IN ONE WEEK, CALL HER OFFICE AT [phone].  SEEK IMMEDIATE MEDICAL ATTENTION AND CALL THE OFFICE: [phone] IF:  You have more than a spotting of blood in your stool.   Your belly is swollen (abdominal distention).   You are nauseated or vomiting.   You have a temperature over 101F.   You have abdominal pain or discomfort that is severe or gets worse throughout the day.  Gastritis  Gastritis is an inflammation (the body's way of reacting to injury and/or infection) of the stomach. It is often caused by viral or bacterial (germ) infections. It can also be caused BY ASPIRIN, BC/GOODY POWDER'S, (IBUPROFEN) MOTRIN, OR ALEVE (NAPROXEN), chemicals (including alcohol), SPICY FOODS, and medications. This illness may be associated with generalized malaise (feeling tired, not well), UPPER ABDOMINAL STOMACH cramps, and fever. One common bacterial cause of gastritis is an organism known as H. Pylori. This can be treated with antibiotics.   ESOPHAGEAL STRICTURE  Esophageal strictures  can be caused by stomach acid backing up into the tube that carries food from the mouth down to the stomach (lower esophagus).  TREATMENT There are a number of medicines used to treat reflux/stricture, including: Antacids.  [facility]  HOME CARE INSTRUCTIONS Eat 2-3 hours before going to bed.  Try to reach and maintain a healthy weight.  Do not eat just a few very large meals. Instead, eat 4 TO 6 smaller meals throughout the day.  Try to identify foods and beverages that make your symptoms  worse, and avoid these.  Avoid tight clothing.  Do not exercise right after eating.

## 2016-02-20 NOTE — Interval H&P Note (Signed)
History and Physical Interval Note:  02/20/2016 10:06 AM  [name] [name]  has presented today for surgery, with the diagnosis of dysphagia/weight losss  The various methods of treatment have been discussed with the patient and family. After consideration of risks, benefits and other options for treatment, the patient has consented to  Procedure(s) with comments: ESOPHAGOGASTRODUODENOSCOPY (EGD) WITH PROPOFOL (N/A) - 930 SAVORY DILATION (N/A) as a surgical intervention .  The patient's history has been reviewed, patient examined, no change in status, stable for surgery.  I have reviewed the patient's chart and labs.  Questions were answered to the patient's satisfaction.     [employer]

## 2016-02-21 LAB — ANTI-SMOOTH MUSCLE ANTIBODY, IGG: F-ACTIN AB IGG: 7 U (ref 0–19)

## 2016-02-21 LAB — CERULOPLASMIN: CERULOPLASMIN: 22.4 mg/dL (ref 19.0–39.0)

## 2016-02-21 LAB — IGG, IGA, IGM
IGM, SERUM: 138 mg/dL (ref 26–217)
IgA: 235 mg/dL (ref 87–352)
IgG (Immunoglobin G), Serum: 764 mg/dL (ref 700–1600)

## 2016-02-21 LAB — HEPATITIS C ANTIBODY: HCV Ab: <0.1 s/co ratio (ref 0.0–0.9)

## 2016-02-21 LAB — ANTINUCLEAR ANTIBODIES, IFA: ANTINUCLEAR ANTIBODIES, IFA: NEGATIVE

## 2016-02-26 ENCOUNTER — Encounter (HOSPITAL_COMMUNITY): Payer: Self-pay | Admitting: Gastroenterology

## 2016-02-27 ENCOUNTER — Other Ambulatory Visit (HOSPITAL_COMMUNITY): Payer: Self-pay | Admitting: Pharmacist

## 2016-02-27 ENCOUNTER — Other Ambulatory Visit (HOSPITAL_COMMUNITY): Payer: Self-pay | Admitting: *Deleted

## 2016-02-27 ENCOUNTER — Telehealth: Payer: Self-pay | Admitting: Gastroenterology

## 2016-02-27 DIAGNOSIS — M545 Low back pain: Secondary | ICD-10-CM | POA: Diagnosis not present

## 2016-02-27 DIAGNOSIS — I10 Essential (primary) hypertension: Secondary | ICD-10-CM | POA: Diagnosis not present

## 2016-02-27 DIAGNOSIS — K0510 Chronic gingivitis, plaque induced: Secondary | ICD-10-CM | POA: Diagnosis not present

## 2016-02-27 DIAGNOSIS — J449 Chronic obstructive pulmonary disease, unspecified: Secondary | ICD-10-CM | POA: Diagnosis not present

## 2016-02-27 DIAGNOSIS — K50912 Crohn's disease, unspecified, with intestinal obstruction: Secondary | ICD-10-CM | POA: Diagnosis not present

## 2016-02-27 DIAGNOSIS — M542 Cervicalgia: Secondary | ICD-10-CM | POA: Diagnosis not present

## 2016-02-27 DIAGNOSIS — R1084 Generalized abdominal pain: Secondary | ICD-10-CM | POA: Diagnosis not present

## 2016-02-27 DIAGNOSIS — K589 Irritable bowel syndrome without diarrhea: Secondary | ICD-10-CM | POA: Diagnosis not present

## 2016-02-27 DIAGNOSIS — K210 Gastro-esophageal reflux disease with esophagitis: Secondary | ICD-10-CM | POA: Diagnosis not present

## 2016-02-27 DIAGNOSIS — M79673 Pain in unspecified foot: Secondary | ICD-10-CM | POA: Diagnosis not present

## 2016-02-27 DIAGNOSIS — Z79891 Long term (current) use of opiate analgesic: Secondary | ICD-10-CM | POA: Diagnosis not present

## 2016-02-27 NOTE — Telephone Encounter (Signed)
[place] SHE IS HAVING BAD STOMACH PAIN AND FEELS LIKE SHE NEEDS TO VOMIT.  TOOK HER LAST PAIN PILL

## 2016-02-27 NOTE — Telephone Encounter (Signed)
I called pt and she said that she was having excruciating abdominal pain all the way across her abdomen below her rib cage earlier today.   She went to see Dr. Lanetta Inch and also Dr. Merlene Laughter.  Dr. Merlene Laughter gave her #15 Percocet.  Pt said she feels much better now, she just wants to know what is causing her pain to be so bad.  She would like results from path when available.  Said she did not know who she is supposed to get the Percocet from, if it is Dr. Oneida Alar or Dr. Merlene Laughter.

## 2016-02-28 ENCOUNTER — Encounter (HOSPITAL_COMMUNITY)
Admission: RE | Admit: 2016-02-28 | Discharge: 2016-02-28 | Disposition: A | Discharge status: Home / Self Care | Payer: Medicare Other | Source: Ambulatory Visit | Attending: Family Medicine | Admitting: Family Medicine

## 2016-02-28 DIAGNOSIS — M810 Age-related osteoporosis without current pathological fracture: Secondary | ICD-10-CM | POA: Diagnosis not present

## 2016-02-28 MED ORDER — ZOLEDRONIC ACID 5 MG/100ML IV SOLN
INTRAVENOUS | Status: AC
Start: 2016-02-28 — End: 2016-02-28
  Filled 2016-02-28: qty 100

## 2016-02-28 MED ORDER — ZOLEDRONIC ACID 5 MG/100ML IV SOLN
5.0000 mg | Freq: Once | INTRAVENOUS | Status: AC
  Administered 2016-02-28: 5 mg via INTRAVENOUS

## 2016-02-28 MED ORDER — SODIUM CHLORIDE 0.9 % IV SOLN
INTRAVENOUS | Status: DC
  Administered 2016-02-28: 250 mL via INTRAVENOUS

## 2016-02-29 MED ORDER — LIDOCAINE VISCOUS 2 % MT SOLN: OROMUCOSAL | 1 refills | Status: DC

## 2016-02-29 NOTE — Telephone Encounter (Signed)
PT is aware. Said she no longer gets her pain meds from Dr. Buelah Manis, she see's Dr. Merlene Laughter now.

## 2016-02-29 NOTE — Telephone Encounter (Signed)
L/M to call.

## 2016-02-29 NOTE — Addendum Note (Signed)
Addended by: Danie Binder on: 02/29/2016 08:37 AM   Modules accepted: Orders

## 2016-02-29 NOTE — Telephone Encounter (Signed)
Please call pt. HER stomach Bx shows gastritis,WHICH CAN CAUSE UPPER ABDOMINAL PAIN. She should avoid things that cause reflux or GASTRITIS. SHE SHOULD AVOID DRINKING BEER OR ALCOHOL IN ANY AMOUNT. I DO NOT PRESCRIBE NARCOTICS FOR CHRONIC ABDOMINAL PAIN. SHE HAS A PAIN CONTRACT WITH DR. Buelah Manis AND IF SHE GETS NARCOTICS FROM MULTIPLE PROVIDERS, IT IS LIKELY GOING TO BE VOIDED. I SENT A RX FOR VISCOUS LIDOCIANE THAT SHE CAN USE AS NEEDED FOR HEARTBURN OR UPPER ABDOMINAL PAIN.   DRINK WATER TO KEEP YOUR URINE LIGHT YELLOW. FOLLOW A LOW FAT DIET.  MEATS SHOULD BE CHOPPED OR GROUND ONLY. AVOID FRIED FOODS.  CONTINUE DEXILANT.  You need the HEPATITIS A/B VACCINE(TWINRIX). YOU CAN GET THAT AT DR[facility].  FOLLOW UP IN FEB 2018 E30 ABDOMINAL PAIN, CROHN'S DISEASE.

## 2016-03-01 NOTE — Telephone Encounter (Signed)
REVIEWED-NO ADDITIONAL RECOMMENDATIONS. 

## 2016-03-05 ENCOUNTER — Telehealth: Payer: Self-pay

## 2016-03-05 ENCOUNTER — Other Ambulatory Visit: Payer: Self-pay | Admitting: Family Medicine

## 2016-03-05 NOTE — Telephone Encounter (Signed)
SEE TCS OCT 24

## 2016-03-05 NOTE — Telephone Encounter (Signed)
Pt left Vm asking for biopsy results when available.

## 2016-03-05 NOTE — Telephone Encounter (Signed)
Pt is aware.  

## 2016-03-06 NOTE — Telephone Encounter (Signed)
I went over the results again with pt yesterday.

## 2016-03-11 DIAGNOSIS — M542 Cervicalgia: Secondary | ICD-10-CM | POA: Diagnosis not present

## 2016-03-11 DIAGNOSIS — K589 Irritable bowel syndrome without diarrhea: Secondary | ICD-10-CM | POA: Diagnosis not present

## 2016-03-11 DIAGNOSIS — J449 Chronic obstructive pulmonary disease, unspecified: Secondary | ICD-10-CM | POA: Diagnosis not present

## 2016-03-11 DIAGNOSIS — M79673 Pain in unspecified foot: Secondary | ICD-10-CM | POA: Diagnosis not present

## 2016-03-11 DIAGNOSIS — R1084 Generalized abdominal pain: Secondary | ICD-10-CM | POA: Diagnosis not present

## 2016-03-11 DIAGNOSIS — M545 Low back pain: Secondary | ICD-10-CM | POA: Diagnosis not present

## 2016-03-11 DIAGNOSIS — K50912 Crohn's disease, unspecified, with intestinal obstruction: Secondary | ICD-10-CM | POA: Diagnosis not present

## 2016-03-11 DIAGNOSIS — Z79891 Long term (current) use of opiate analgesic: Secondary | ICD-10-CM | POA: Diagnosis not present

## 2016-04-03 DIAGNOSIS — H04123 Dry eye syndrome of bilateral lacrimal glands: Secondary | ICD-10-CM | POA: Diagnosis not present

## 2016-04-09 DIAGNOSIS — J449 Chronic obstructive pulmonary disease, unspecified: Secondary | ICD-10-CM | POA: Diagnosis not present

## 2016-04-09 DIAGNOSIS — M545 Low back pain: Secondary | ICD-10-CM | POA: Diagnosis not present

## 2016-04-09 DIAGNOSIS — M542 Cervicalgia: Secondary | ICD-10-CM | POA: Diagnosis not present

## 2016-04-09 DIAGNOSIS — K589 Irritable bowel syndrome without diarrhea: Secondary | ICD-10-CM | POA: Diagnosis not present

## 2016-04-09 DIAGNOSIS — R1084 Generalized abdominal pain: Secondary | ICD-10-CM | POA: Diagnosis not present

## 2016-04-09 DIAGNOSIS — M79673 Pain in unspecified foot: Secondary | ICD-10-CM | POA: Diagnosis not present

## 2016-04-09 DIAGNOSIS — Z79891 Long term (current) use of opiate analgesic: Secondary | ICD-10-CM | POA: Diagnosis not present

## 2016-04-09 DIAGNOSIS — K50912 Crohn's disease, unspecified, with intestinal obstruction: Secondary | ICD-10-CM | POA: Diagnosis not present

## 2016-04-17 DIAGNOSIS — H04123 Dry eye syndrome of bilateral lacrimal glands: Secondary | ICD-10-CM | POA: Diagnosis not present

## 2016-04-20 ENCOUNTER — Other Ambulatory Visit: Payer: Self-pay | Admitting: Family Medicine

## 2016-04-22 NOTE — Telephone Encounter (Signed)
Ok to refill Diazepam??  Last office visit 01/09/2016.  Last refill 01/25/2016, #2 refills.

## 2016-04-22 NOTE — Telephone Encounter (Signed)
Medication called to pharmacy. 

## 2016-04-22 NOTE — Telephone Encounter (Signed)
okay

## 2016-05-01 ENCOUNTER — Encounter: Payer: Self-pay | Admitting: Gastroenterology

## 2016-05-05 DIAGNOSIS — S0990XA Unspecified injury of head, initial encounter: Secondary | ICD-10-CM | POA: Diagnosis not present

## 2016-05-05 DIAGNOSIS — J449 Chronic obstructive pulmonary disease, unspecified: Secondary | ICD-10-CM | POA: Diagnosis not present

## 2016-05-05 DIAGNOSIS — G40909 Epilepsy, unspecified, not intractable, without status epilepticus: Secondary | ICD-10-CM | POA: Diagnosis not present

## 2016-05-05 DIAGNOSIS — R4182 Altered mental status, unspecified: Secondary | ICD-10-CM | POA: Diagnosis not present

## 2016-05-05 DIAGNOSIS — R569 Unspecified convulsions: Secondary | ICD-10-CM | POA: Diagnosis not present

## 2016-05-05 DIAGNOSIS — F17210 Nicotine dependence, cigarettes, uncomplicated: Secondary | ICD-10-CM | POA: Diagnosis not present

## 2016-05-05 DIAGNOSIS — E7800 Pure hypercholesterolemia, unspecified: Secondary | ICD-10-CM | POA: Diagnosis not present

## 2016-05-05 DIAGNOSIS — I10 Essential (primary) hypertension: Secondary | ICD-10-CM | POA: Diagnosis not present

## 2016-05-09 ENCOUNTER — Telehealth: Payer: Self-pay | Admitting: Family Medicine

## 2016-05-09 ENCOUNTER — Ambulatory Visit (INDEPENDENT_AMBULATORY_CARE_PROVIDER_SITE_OTHER): Payer: Medicare Other | Admitting: Physician Assistant

## 2016-05-09 ENCOUNTER — Encounter: Payer: Self-pay | Admitting: Physician Assistant

## 2016-05-09 VITALS — BP 136/82 | HR 80 | Temp 98.5°F | Resp 16 | Wt 102.0 lb

## 2016-05-09 DIAGNOSIS — R569 Unspecified convulsions: Secondary | ICD-10-CM

## 2016-05-09 NOTE — Telephone Encounter (Signed)
Phone number invalid.   No daughter listed on DPR.

## 2016-05-09 NOTE — Telephone Encounter (Signed)
Patients daughter calling to discuss some seizure activity she had yesterday please call her at [phone]

## 2016-05-09 NOTE — Progress Notes (Signed)
Patient ID: [name] [name] MRN: [id number], DOB: [birth year], 61 y.o. Date of Encounter: 05/09/2016, 3:04 PM    Chief Complaint:  Chief Complaint  Patient presents with  . seizure follow up from ER  . rash on left arm     HPI: 61 y.o. year old female presents with above.   She has one piece of paper with her from a hospital in [place], [place] dated 05/05/16. However there is no medical information documented on this paper and she has no other hospital records. She states that her father passed away and she went there to [facility] for his funeral. She states that she had a seizure. Says that she had never had a seizure before. Says that she doesn't remember anything from the episode but her daughter told her that she just fell straight back-- did not even put her hands out to try to catch herself. Says that she also had jerking movements of her body. As far as patient is aware she does not think she bit her tongue and does not think she had urine incontinence. Says that she was transferred by EMS to the hospital where they performed a CT "to make sure there was no aneurysm "and did labs. Was told that her glucose was normal and other labs normal. Was told to follow-up with her PCP immediately after return here. States that she was not started on any new medication. States that she is not driving. Her husband is with her today and is driving. She does note that she had only had a couple of hours of sleep for several nights prior to this episode. She has had no further recurrence of episode.  She also has some itchy rash on her left wrist. Says that it is only on her left wrist and has no other areas of itching or rash.     Home Meds:   Outpatient Medications Prior to Visit  Medication Sig Dispense Refill  . albuterol (VENTOLIN HFA) 108 (90 Base) MCG/ACT inhaler INHALE TWO PUFFS BY MOUTH EVERY 4 HOURS AS NEEDED FOR WHEEZING 18 each 2  . calcium carbonate (TUMS - DOSED IN MG ELEMENTAL  CALCIUM) 500 MG chewable tablet Chew 1-2 tablets by mouth 2 (two) times daily as needed for indigestion or heartburn.    . cholestyramine (QUESTRAN) 4 GM/DOSE powder DISSOLVE AND TAKE 2 GRAMS BY MOUTH TWICE DAILY WITH A MEAL - DO NOT TAKE WITHIN 2 HOURS OF OTHER MEDICATIONS 378 g 3  . cyclobenzaprine (FLEXERIL) 5 MG tablet Take 5 mg by mouth 2 (two) times daily as needed for muscle spasms.     Marland Kitchen dexlansoprazole (DEXILANT) 60 MG capsule 1 PO EVERY MORNING WITH BREAKFAST. 30 capsule 11  . diazepam (VALIUM) 5 MG tablet TAKE ONE TABLET BY MOUTH EVERY 12 HOURS AS NEEDED (  MUST  BE  30  DAYS  APART) 30 tablet 2  . fluorometholone (FML) 0.1 % ophthalmic ointment 1 application 4 (four) times daily.    . fluticasone (CUTIVATE) 0.05 % cream     . fluticasone (FLONASE) 50 MCG/ACT nasal spray USE TWO SPRAY(S) IN EACH NOSTRIL ONCE DAILY AS NEEDED FOR ALLERGY OR RHINITIS 16 g 0  . gabapentin (NEURONTIN) 400 MG capsule Take 1 capsule by mouth 3 (three) times daily.    Marland Kitchen HYDROcodone-acetaminophen (NORCO) 10-325 MG per tablet Take 1 tablet by mouth every 6 (six) hours as needed for moderate pain.    Marland Kitchen ketoconazole (NIZORAL) 2 % shampoo Apply 1  application topically 2 (two) times a week. For 4 weeks1 120 mL 1  . lidocaine (XYLOCAINE) 2 % solution 2 TSP  PO 30 MINUTES PRIOR TO MEALS AND AT BEDTIME PRN FOR ABDOMINAL OR CHEST PAIN. MAY REPEAT DOSE EVERY 4 HOURS. NO MORE THAN 8 DOSES A DAY. 300 mL 1  . lidocaine (XYLOCAINE) 2 % solution 2 TSP PO 30 MINS PRIOR TO MEALS AND QHS PRN FOR ABDOMINAL OR CHEST PAIN. MAY REPEAT DOSE EVERY 4 HRS. NO MORE THAN 8 DOSES A DAY. 300 mL 1  . Lifitegrast (XIIDRA) 5 % SOLN Apply to eye 2 (two) times daily.    Marland Kitchen losartan-hydrochlorothiazide (HYZAAR) 50-12.5 MG tablet Take 1 tablet by mouth daily. 30 tablet 3  . loteprednol (LOTEMAX) 0.5 % ophthalmic suspension Place 2 drops into both eyes 2 (two) times daily.    Marland Kitchen nystatin (MYCOSTATIN) 100000 UNIT/ML suspension TAKE  5 ML BY MOUTH 4 TIMES  DAILY AS NEEDED 180 mL 0  . oxyCODONE-acetaminophen (PERCOCET) 10-325 MG tablet Take 1 tablet by mouth every 4 (four) hours as needed for pain.    Marland Kitchen POTASSIUM PO Take 1 tablet by mouth daily. Over the counter.    . Probiotic Product (PROBIOTIC DAILY PO) Take by mouth at bedtime.    . promethazine (PHENERGAN) 12.5 MG tablet 1-2 po q4-6 h prn nausea or vomiting 60 tablet 3  . SYMBICORT 80-4.5 MCG/ACT inhaler INHALE TWO PUFFS BY MOUTH TWICE DAILY 1 Inhaler 11   No facility-administered medications prior to visit.     Allergies:  Allergies  Allergen Reactions  . Lovastatin Other (See Comments)    Chest pain  . Naproxen Other (See Comments)    Chest pain/ SOB   . Toradol [Ketorolac Tromethamine] Other (See Comments)    Headache. Non-effective  . Tramadol Other (See Comments)    Headache. Non-effective.  Marland Kitchen Nexium [Esomeprazole Magnesium] Diarrhea    Abdominal pain      Review of Systems: See HPI for pertinent ROS. All other ROS negative.    Physical Exam: Blood pressure 136/82, pulse 80, temperature 98.5 F (36.9 C), temperature source Oral, resp. rate 16, weight 102 lb (46.3 kg), SpO2 98 %., Body mass index is 19.92 kg/m. General:  Petite WF Appears in no acute distress. Neck: Supple. No thyromegaly. No lymphadenopathy. Lungs: Clear bilaterally to auscultation without wheezes, rales, or rhonchi. Breathing is unlabored. Heart: Regular rhythm. No murmurs, rubs, or gallops. Msk:  Strength and tone normal for age. Extremities/Skin: Left Wrist--Palmar surface---Small erythematous papules---increased amount at area of watchband, then more scattered up forearm. No other area of rash Neuro: Alert and oriented X 3. Moves all extremities spontaneously. Gait is normal. CNII-XII grossly in tact. Psych:  Responds to questions appropriately with a normal affect.     ASSESSMENT AND PLAN:  61 y.o. year old female with  1. Seizures ([place]) Will refer to neurology for follow-up and further  evaluation. No driving until violation complete and specialist approves driving. - Ambulatory referral to Neurology  2. Allergic Dermatitis Apply hydrocortisone cream to the area.   Marin Olp [place], [place], [facility] 05/09/2016 3:04 PM

## 2016-05-21 ENCOUNTER — Emergency Department (HOSPITAL_COMMUNITY): Payer: Medicare Other

## 2016-05-21 ENCOUNTER — Encounter (HOSPITAL_COMMUNITY): Payer: Self-pay | Admitting: Emergency Medicine

## 2016-05-21 ENCOUNTER — Emergency Department (HOSPITAL_COMMUNITY)
Admission: EM | Admit: 2016-05-21 | Discharge: 2016-05-21 | Disposition: A | Discharge status: Home / Self Care | Payer: Medicare Other | Attending: Emergency Medicine | Admitting: Emergency Medicine

## 2016-05-21 DIAGNOSIS — R110 Nausea: Secondary | ICD-10-CM | POA: Insufficient documentation

## 2016-05-21 DIAGNOSIS — I10 Essential (primary) hypertension: Secondary | ICD-10-CM | POA: Insufficient documentation

## 2016-05-21 DIAGNOSIS — J45909 Unspecified asthma, uncomplicated: Secondary | ICD-10-CM | POA: Insufficient documentation

## 2016-05-21 DIAGNOSIS — R42 Dizziness and giddiness: Secondary | ICD-10-CM | POA: Insufficient documentation

## 2016-05-21 DIAGNOSIS — Z79899 Other long term (current) drug therapy: Secondary | ICD-10-CM | POA: Insufficient documentation

## 2016-05-21 DIAGNOSIS — J449 Chronic obstructive pulmonary disease, unspecified: Secondary | ICD-10-CM | POA: Diagnosis not present

## 2016-05-21 DIAGNOSIS — Z87891 Personal history of nicotine dependence: Secondary | ICD-10-CM | POA: Insufficient documentation

## 2016-05-21 LAB — CBC
HCT: 39.5 % (ref 36.0–46.0)
Hemoglobin: 13.8 g/dL (ref 12.0–15.0)
MCH: 35.7 pg — ABNORMAL HIGH (ref 26.0–34.0)
MCHC: 34.9 g/dL (ref 30.0–36.0)
MCV: 102.1 fL — ABNORMAL HIGH (ref 78.0–100.0)
PLATELETS: 215 10*3/uL (ref 150–400)
RBC: 3.87 MIL/uL (ref 3.87–5.11)
RDW: 13.6 % (ref 11.5–15.5)
WBC: 3.2 10*3/uL — AB (ref 4.0–10.5)

## 2016-05-21 LAB — URINALYSIS, ROUTINE W REFLEX MICROSCOPIC
Bilirubin Urine: NEGATIVE
Glucose, UA: NEGATIVE mg/dL
Hgb urine dipstick: NEGATIVE
Ketones, ur: NEGATIVE mg/dL
LEUKOCYTES UA: NEGATIVE
Nitrite: NEGATIVE
PROTEIN: NEGATIVE mg/dL
SPECIFIC GRAVITY, URINE: 1.013 (ref 1.005–1.030)
pH: 5 (ref 5.0–8.0)

## 2016-05-21 LAB — BASIC METABOLIC PANEL
ANION GAP: 9 (ref 5–15)
BUN: 7 mg/dL (ref 6–20)
CALCIUM: 8.5 mg/dL — AB (ref 8.9–10.3)
CHLORIDE: 103 mmol/L (ref 101–111)
CO2: 24 mmol/L (ref 22–32)
CREATININE: 0.46 mg/dL (ref 0.44–1.00)
GFR calc non Af Amer: >60 mL/min (ref >60)
Glucose, Bld: 80 mg/dL (ref 65–99)
Potassium: 4.2 mmol/L (ref 3.5–5.1)
Sodium: 136 mmol/L (ref 135–145)

## 2016-05-21 LAB — CBG MONITORING, ED: GLUCOSE-CAPILLARY: 75 mg/dL (ref 65–99)

## 2016-05-21 MED ORDER — MECLIZINE HCL 12.5 MG PO TABS
25.0000 mg | ORAL_TABLET | Freq: Once | ORAL | Status: AC
  Administered 2016-05-21: 25 mg via ORAL
  Filled 2016-05-21: qty 2

## 2016-05-21 NOTE — ED Provider Notes (Signed)
[facility] DEPT Provider Note   CSN: [id number] Arrival date & time: 05/21/16  1156  By signing my name below, I, Collene Leyden, attest that this documentation has been prepared under the direction and in the presence of Carmin Muskrat, MD. Electronically Signed: Collene Leyden, Scribe. 05/21/16. 1:02 PM.   History   Chief Complaint Chief Complaint  Patient presents with  . Dizziness   HPI Comments: [name] [name] is a 61 y.o. female who presents to the Emergency Department complaining of constant dizziness that began 2 weeks ago. Patient states her father recently died and she drove to a funeral in [place] 2 weeks ago. Patient states she was saying her goodbyes and states she didn't feel right. She states her eyes weren't focusing and she fell to the ground. Patient states that she has been constantly dizzy since the fall but but the severity goes up and down. No associated symptoms. Patient states bending over makes the dizziness worse. No modifying factors indicated. Before passing out, looking up made her more dizzy. She denies any new visual changes.     The history is provided by the patient. No language interpreter was used.    Past Medical History:  Diagnosis Date  . Allergic rhinitis   . Anastomotic ulcer JAN 2009  . Anxiety   . Asthma   . BMI (body mass index) 20.0-29.9 2009 121 lbs  . 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 Crohn disease ([place])   . Crohn disease ([place])   . CTS (carpal tunnel syndrome)   . Diarrhea MULITIFACTORIAL   IBS, LACTOSE INTOLERANCE, SBBO, BILE-SALT  . Elevated liver enzymes 2009: BMI 24 ?Etoh ALT 94, AST 51,  NEG ANA, qIGs& ASMA   MAY 2012 AST 52 ALT 38  . Esophageal stricture 2009  . GERD (gastroesophageal reflux disease)   . Hemorrhoid   . Hyperlipemia   . Hypertension   . Inflammatory bowel disease 2006 CD   SURGICAL REMISSION  . LBP (low back pain)   . Migraine   . PONV (postoperative nausea and vomiting)   . Shortness of  breath    exertion and humidity  . Sleep apnea    Stop [name]    Patient Active Problem List   Diagnosis Date Noted  . Loss of weight 07/04/2015  . Osteoarthritis 12/27/2014  . Allergic dermatitis eyelid 12/27/2014  . Impetigo 04/05/2014  . Ganglion cyst of wrist 04/05/2014  . Abnormal CT scan, chest 01/12/2014  . Thrush 10/25/2013  . Seborrheic dermatitis of scalp 04/12/2013  . Infected insect bite of left breast 11/17/2012  . Polycythemia 09/08/2012  . Vertigo 01/22/2012  . Neck pain 01/22/2012  . Chronic pain 12/27/2011  . Cigarette smoker 05/13/2011  . Dysphagia 03/14/2011  . DEPRESSION 04/24/2010  . Dyspnea 08/04/2009  . Osteoporosis 11/11/2008  . BACK PAIN, LUMBAR, WITH RADICULOPATHY 07/28/2008  . TRANSAMINASES, SERUM, ELEVATED 03/18/2008  . GENITAL HERPES 09/29/2007  . 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 Hyperlipidemia 02/03/2007  . VARCIES, SITE [place] 09/30/2006  . ANXIETY 02/27/2006  . Essential hypertension 02/27/2006  . Allergic rhinitis 02/27/2006  . GERD 02/27/2006  . Crohn's disease of small intestine with complication ([place]) 16/38/4665    Past Surgical History:  Procedure Laterality Date  . BACK SURGERY    . BILATERAL SALPINGOOPHORECTOMY    . BIOPSY  12/24/2011   Procedure: BIOPSY;  Surgeon: Danie Binder, MD;  Location: [facility];  Service: Endoscopy;;  Gastric Biopsies  .  BIOPSY N/A 06/30/2012   Procedure: BIOPSY;  Surgeon: Danie Binder, MD;  Location: [facility];  Service: Endoscopy;  Laterality: N/A;  Gastric and Esophageal Biopsies  . CARPAL TUNNEL RELEASE  LEFT  . COLON SURGERY    . COLONOSCOPY  JAN 2009 DIARRHEA, ABD PAIN, BRBPR   ANASTOMOTIC ULCER(3MM), IH GU:RKYHCW ULCER, NL COLON bX  . DILATION AND CURETTAGE OF UTERUS    . ESOPHAGEAL DILATION  12/24/2011   SLF: A stricture was found in the distal esophagus/ Moderate gastritis/ MILD Duodenitis  . ESOPHAGOGASTRODUODENOSCOPY  02/07/09   mild gastritis  .  ESOPHAGOGASTRODUODENOSCOPY (EGD) WITH PROPOFOL N/A 06/30/2012   SLF: 1. No definite stricture appreciated 2. small hiatal hernia 3. Gastritis  . ESOPHAGOGASTRODUODENOSCOPY (EGD) WITH PROPOFOL N/A 02/20/2016   Procedure: ESOPHAGOGASTRODUODENOSCOPY (EGD) WITH PROPOFOL;  Surgeon: Danie Binder, MD;  Location: [facility];  Service: Endoscopy;  Laterality: N/A;  930  . gum flap Bilateral   . HEMICOLECTOMY  RIGHT 2006  . HERNIA REPAIR    . LUMBAR DISC SURGERY    . SAVORY DILATION N/A 06/30/2012   Procedure: SAVORY DILATION;  Surgeon: Danie Binder, MD;  Location: [facility];  Service: Endoscopy;  Laterality: N/A;  12.29m , 124m 1535m. SAVORY DILATION N/A 02/20/2016   Procedure: SAVORY DILATION;  Surgeon: SanDanie BinderD;  Location: [facility];  Service: Endoscopy;  Laterality: N/A;  . UPPER GASTROINTESTINAL ENDOSCOPY  JAN 2009 ABD PAIN   GASTRITIS, ESO RING  . UPPER GASTROINTESTINAL ENDOSCOPY  OCT 2010 ABD PAIN, DYSPHAGIA   DIL 15MM, GASTRITIS, NL DUODENUM  . UPPER GASTROINTESTINAL ENDOSCOPY  OCT 2010 DYSPHAGIA   DIL 17 MM, GASTRITIS, NL DUODENUM  . vocal cord surgery  Sep 20, 2010   precancerous areas removed  . wisdom tooth extraction Right    pin placement due to broken jaw    OB History    No data available       Home Medications    Prior to Admission medications   Medication Sig Start Date End Date Taking? Authorizing Provider  albuterol (VENTOLIN HFA) 108 (90 Base) MCG/ACT inhaler INHALE TWO PUFFS BY MOUTH EVERY 4 HOURS AS NEEDED FOR WHEEZING 01/25/16   KawAlycia RossettiD  calcium carbonate (TUMS - DOSED IN MG ELEMENTAL CALCIUM) 500 MG chewable tablet Chew 1-2 tablets by mouth 2 (two) times daily as needed for indigestion or heartburn.    Historical Provider, MD  cholestyramine (QULucrezia Starch GM/DOSE powder DISSOLVE AND TAKE 2 GRAMS BY MOUTH TWICE DAILY WITH A MEAL - DO NOT TAKE WITHIN 2 HOURS OF OTHER MEDICATIONS 02/01/16   AnnAnnitta NeedsP  cyclobenzaprine (FLEXERIL) 5 MG  tablet Take 5 mg by mouth 2 (two) times daily as needed for muscle spasms.     Historical Provider, MD  dexlansoprazole (DEXILANT) 60 MG capsule 1 PO EVERY MORNING WITH BREAKFAST. 02/20/16   SanDanie BinderD  diazepam (VALIUM) 5 MG tablet TAKE ONE TABLET BY MOUTH EVERY 12 HOURS AS NEEDED (  MUST  BE  30  DAYS  APART) 04/22/16   KawAlycia RossettiD  fluorometholone (FML) 0.1 % ophthalmic ointment 1 application 4 (four) times daily.    Historical Provider, MD  fluticasone (CUTIVATE) 0.05 % cream  12/15/14   Historical Provider, MD  fluticasone (FLONASE) 50 MCG/ACT nasal spray USE TWO SPRAY(S) IN EACH NOSTRIL ONCE DAILY AS NEEDED FOR ALLERGY OR RHINITIS 11/27/15   KawAlycia RossettiD  gabapentin (NEURONTIN)  400 MG capsule Take 1 capsule by mouth 3 (three) times daily. 01/17/16   Historical Provider, MD  HYDROcodone-acetaminophen (NORCO) 10-325 MG per tablet Take 1 tablet by mouth every 6 (six) hours as needed for moderate pain.    Historical Provider, MD  ketoconazole (NIZORAL) 2 % shampoo Apply 1 application topically 2 (two) times a week. For 4 weeks1 10/25/13   Alycia Rossetti, MD  lidocaine (XYLOCAINE) 2 % solution 2 TSP  PO 30 MINUTES PRIOR TO MEALS AND AT BEDTIME PRN FOR ABDOMINAL OR CHEST PAIN. MAY REPEAT DOSE EVERY 4 HOURS. NO MORE THAN 8 DOSES A DAY. 02/07/16   Danie Binder, MD  lidocaine (XYLOCAINE) 2 % solution 2 TSP PO 30 MINS PRIOR TO MEALS AND QHS PRN FOR ABDOMINAL OR CHEST PAIN. MAY REPEAT DOSE EVERY 4 HRS. NO MORE THAN 8 DOSES A DAY. 02/29/16   Danie Binder, MD  Lifitegrast Shirley Friar) 5 % SOLN Apply to eye 2 (two) times daily.    Historical Provider, MD  losartan-hydrochlorothiazide (HYZAAR) 50-12.5 MG tablet Take 1 tablet by mouth daily. 07/04/15   Alycia Rossetti, MD  loteprednol (LOTEMAX) 0.5 % ophthalmic suspension Place 2 drops into both eyes 2 (two) times daily.    Historical Provider, MD  nystatin (MYCOSTATIN) 100000 UNIT/ML suspension TAKE  5 ML BY MOUTH 4 TIMES DAILY AS NEEDED  04/22/16   Alycia Rossetti, MD  oxyCODONE-acetaminophen (PERCOCET) 10-325 MG tablet Take 1 tablet by mouth every 4 (four) hours as needed for pain.    Historical Provider, MD  POTASSIUM PO Take 1 tablet by mouth daily. Over the counter.    Historical Provider, MD  Probiotic Product (PROBIOTIC DAILY PO) Take by mouth at bedtime.    Historical Provider, MD  promethazine (PHENERGAN) 12.5 MG tablet 1-2 po q4-6 h prn nausea or vomiting 02/07/16   Danie Binder, MD  SYMBICORT 80-4.5 MCG/ACT inhaler INHALE TWO PUFFS BY MOUTH TWICE DAILY 11/02/15   Alycia Rossetti, MD    Family History Family History  Problem Relation Age of Onset  . Hypothyroidism Mother   . Heart disease Father   . Hypothyroidism Daughter   . Colon cancer Neg Hx   . Colon polyps Neg Hx     Social History Social History  Substance Use Topics  . Smoking status: Former Smoker    Packs/day: 0.50    Years: 30.00    Types: Cigarettes    Quit date: 01/27/2014  . Smokeless tobacco: Never Used  . Alcohol use No     Allergies   Lovastatin; Naproxen; Toradol [ketorolac tromethamine]; Tramadol; and Nexium [esomeprazole magnesium]   Review of Systems Review of Systems  Constitutional:       Per HPI, otherwise negative  HENT:       Per HPI, otherwise negative  Respiratory:       Per HPI, otherwise negative  Cardiovascular:       Per HPI, otherwise negative  Gastrointestinal: Positive for nausea. Negative for vomiting.  Endocrine:       Negative aside from HPI  Genitourinary:       Neg aside from HPI   Musculoskeletal:       Per HPI, otherwise negative  Skin: Negative.   Neurological: Positive for dizziness and light-headedness. Negative for seizures, syncope, speech difficulty and weakness.     Physical Exam Updated Vital Signs BP 132/75   Pulse 80   Temp 98.7 F (37.1 C) (Oral)   Resp 22  Ht 5' (1.524 m)   Wt 98 lb (44.5 kg)   SpO2 94%   BMI 19.14 kg/m   Physical Exam  Constitutional: She is  oriented to person, place, and time. She appears well-developed and well-nourished. No distress.  HENT:  Head: Normocephalic and atraumatic.  Eyes: Conjunctivae and EOM are normal.  Cardiovascular: Normal rate and regular rhythm.   Pulmonary/Chest: Effort normal and breath sounds normal. No stridor. No respiratory distress.  Abdominal: She exhibits no distension.  Musculoskeletal: She exhibits no edema.  Neurological: She is alert and oriented to person, place, and time. She displays no atrophy and no tremor. A sensory deficit is present. No cranial nerve deficit. She exhibits normal muscle tone. She displays no seizure activity.  Skin: Skin is warm and dry.  Psychiatric: Her mood appears anxious.  Patient has very poor insight, is a poor historian  Nursing note and vitals reviewed.    ED Treatments / Results  DIAGNOSTIC STUDIES: Oxygen Saturation is 94% on RA, adequate by my interpretation.    COORDINATION OF CARE: 12:59 PM Discussed treatment plan with pt at bedside and pt agreed to plan.  Labs (all labs ordered are listed, but only abnormal results are displayed) Labs Reviewed  BASIC METABOLIC PANEL - Abnormal; Notable for the following:       Result Value   Calcium 8.5 (*)    All other components within normal limits  CBC - Abnormal; Notable for the following:    WBC 3.2 (*)    MCV 102.1 (*)    MCH 35.7 (*)    All other components within normal limits  URINALYSIS, ROUTINE W REFLEX MICROSCOPIC  CBG MONITORING, ED    Radiology Mr Brain Wo Contrast  Result Date: 05/21/2016 CLINICAL DATA:  New persistent dizziness EXAM: MRI HEAD WITHOUT CONTRAST TECHNIQUE: Multiplanar, multiecho pulse sequences of the brain and surrounding structures were obtained without intravenous contrast. COMPARISON:  None. FINDINGS: Brain: Mild atrophy. Negative for hydrocephalus. Negative for acute infarct. Scattered small subcortical white matter hyperintensities consistent with chronic microvascular  ischemia. Negative for hemorrhage or mass. Pituitary normal in size. Vascular: Normal arterial flow voids Skull and upper cervical spine: Negative Sinuses/Orbits: Mild mucosal edema paranasal sinuses.  Normal orbit. Other: None IMPRESSION: Mild atrophy and mild chronic microvascular ischemia. No acute abnormality Mild mucosal edema paranasal sinuses. Electronically Signed   By: Franchot Gallo M.D.   On: 05/21/2016 14:09    Procedures Procedures (including critical care time)  Medications Ordered in ED Medications - No data to display   Initial Impression / Assessment and Plan / ED Course  I have reviewed the triage vital signs and the nursing notes.  Pertinent labs & imaging results that were available during my care of the patient were reviewed by me and considered in my medical decision making (see chart for details).  Clinical Course     3:27 PM All results discussed with the patient and her husband. With reassuring MRI, labs, no sustained neurologic complaints, beyond baseline mild dizziness, the patient is appropriate for further evaluation as an outpatient. Patient has scheduled outpatient neurology follow-up tomorrow. Patient was encouraged to keep that appointment. Patient symptoms maybe secondary to concussion versus vertigo, with no evidence for stroke, the ReFlex, patient discharged in stable condition.  Final Clinical Impressions(s) / ED Diagnoses  Dizziness  I personally performed the services described in this documentation, which was scribed in my presence. The recorded information has been reviewed and is accurate.  Carmin Muskrat, MD 05/21/16 [phone]

## 2016-05-21 NOTE — ED Notes (Signed)
Pt taken to MRI  

## 2016-05-21 NOTE — ED Notes (Signed)
ED Provider at bedside. 

## 2016-05-21 NOTE — Discharge Instructions (Signed)
As discussed, your evaluation today has been largely reassuring.  But, it is important that you monitor your condition carefully, and do not hesitate to return to the ED if you develop new, or concerning changes in your condition. ? ?Otherwise, please follow-up with your physician for appropriate ongoing care. ? ?

## 2016-05-21 NOTE — ED Triage Notes (Signed)
Pt reports that she was in [place] for fathers funeral over Christmas holiday.  Pt husband reports that she fainted and landed on her face on 05/05/16.  Pt had CT scan on that day and has had h/a and has been dizzy since.  Pt was supposed to go see neurology to have MRI, but has been unable to get an appt.  Pt alert and oriented at this time.

## 2016-05-24 DIAGNOSIS — M79673 Pain in unspecified foot: Secondary | ICD-10-CM | POA: Diagnosis not present

## 2016-05-24 DIAGNOSIS — G253 Myoclonus: Secondary | ICD-10-CM | POA: Diagnosis not present

## 2016-05-24 DIAGNOSIS — M542 Cervicalgia: Secondary | ICD-10-CM | POA: Diagnosis not present

## 2016-05-24 DIAGNOSIS — Z79891 Long term (current) use of opiate analgesic: Secondary | ICD-10-CM | POA: Diagnosis not present

## 2016-05-24 DIAGNOSIS — K50912 Crohn's disease, unspecified, with intestinal obstruction: Secondary | ICD-10-CM | POA: Diagnosis not present

## 2016-05-24 DIAGNOSIS — J449 Chronic obstructive pulmonary disease, unspecified: Secondary | ICD-10-CM | POA: Diagnosis not present

## 2016-05-24 DIAGNOSIS — H8113 Benign paroxysmal vertigo, bilateral: Secondary | ICD-10-CM | POA: Diagnosis not present

## 2016-05-24 DIAGNOSIS — G44301 Post-traumatic headache, unspecified, intractable: Secondary | ICD-10-CM | POA: Diagnosis not present

## 2016-05-24 DIAGNOSIS — F0781 Postconcussional syndrome: Secondary | ICD-10-CM | POA: Diagnosis not present

## 2016-05-24 DIAGNOSIS — R1084 Generalized abdominal pain: Secondary | ICD-10-CM | POA: Diagnosis not present

## 2016-05-24 DIAGNOSIS — M545 Low back pain: Secondary | ICD-10-CM | POA: Diagnosis not present

## 2016-05-24 DIAGNOSIS — K589 Irritable bowel syndrome without diarrhea: Secondary | ICD-10-CM | POA: Diagnosis not present

## 2016-05-30 DIAGNOSIS — Z79891 Long term (current) use of opiate analgesic: Secondary | ICD-10-CM | POA: Diagnosis not present

## 2016-05-30 DIAGNOSIS — K589 Irritable bowel syndrome without diarrhea: Secondary | ICD-10-CM | POA: Diagnosis not present

## 2016-05-30 DIAGNOSIS — M542 Cervicalgia: Secondary | ICD-10-CM | POA: Diagnosis not present

## 2016-05-30 DIAGNOSIS — M79673 Pain in unspecified foot: Secondary | ICD-10-CM | POA: Diagnosis not present

## 2016-05-30 DIAGNOSIS — J449 Chronic obstructive pulmonary disease, unspecified: Secondary | ICD-10-CM | POA: Diagnosis not present

## 2016-05-30 DIAGNOSIS — K50912 Crohn's disease, unspecified, with intestinal obstruction: Secondary | ICD-10-CM | POA: Diagnosis not present

## 2016-05-30 DIAGNOSIS — M545 Low back pain: Secondary | ICD-10-CM | POA: Diagnosis not present

## 2016-05-30 DIAGNOSIS — R1084 Generalized abdominal pain: Secondary | ICD-10-CM | POA: Diagnosis not present

## 2016-05-30 DIAGNOSIS — R569 Unspecified convulsions: Secondary | ICD-10-CM | POA: Diagnosis not present

## 2016-06-04 ENCOUNTER — Ambulatory Visit (INDEPENDENT_AMBULATORY_CARE_PROVIDER_SITE_OTHER): Payer: Medicare Other | Admitting: Diagnostic Neuroimaging

## 2016-06-04 ENCOUNTER — Encounter: Payer: Self-pay | Admitting: Diagnostic Neuroimaging

## 2016-06-04 VITALS — BP 123/74 | HR 88 | Ht 60.0 in | Wt 104.2 lb

## 2016-06-04 DIAGNOSIS — F0781 Postconcussional syndrome: Secondary | ICD-10-CM | POA: Diagnosis not present

## 2016-06-04 DIAGNOSIS — R269 Unspecified abnormalities of gait and mobility: Secondary | ICD-10-CM

## 2016-06-04 DIAGNOSIS — R561 Post traumatic seizures: Secondary | ICD-10-CM

## 2016-06-04 DIAGNOSIS — R42 Dizziness and giddiness: Secondary | ICD-10-CM

## 2016-06-04 DIAGNOSIS — R569 Unspecified convulsions: Secondary | ICD-10-CM

## 2016-06-04 NOTE — Progress Notes (Signed)
[facility]  PATIENT: [name] [name] DOB: [birth year]  REFERRING CLINICIAN: Dena Billet, PA-c HISTORY FROM: patient and husband REASON FOR VISIT: new consult    HISTORICAL  CHIEF COMPLAINT:  Chief Complaint  Patient presents with  . Seizures    rm 6, New Pt, husband- [name] [name], "here to find out what's wrong with me; MRI, CT, EEG- don't know the results"    HISTORY OF PRESENT ILLNESS:   61 year old female here for evaluation of seizure and postconcussion syndrome.  End of December 2017, patient's father passed away. Patient traveled to [place] to be with family and for funeral ceremonies. Patient was having very little sleep during that weekend, averaging less than 3 hours per day. One day patient was outside, felt dizzy, fell to the ground and hit her head. She struck her right frontal region on the ground. She had brief loss of consciousness. Apparently she "swallowed her tongue", eyes rolled back, had shaking all over. No tongue biting or incontinence. Patient was with her daughter and son at the time. Patient's daughter, who is a paramedic, but he denies symptoms of possible seizure-like activity. Patient was taken to hospital and evaluated. CT scan and lab testing were performed which apparently were unremarkable. Patient then traveled back to [place] for further evaluation.  Patient followed up with her neurologist (Dr. Merlene Laughter, who treats patients headaches and chronic pain) ordered MRI and EEG. MRI was performed in the hospital after patient went there for emergency room evaluation of dizzy symptoms. MRI brain was unremarkable. Patient had EEG which was done last week patient does not have results yet.  Since patient's head injury, syncope, possible seizure, patient has been having diffuse problems of dizziness, memory loss, confusion, mood changes, focusing problems.  Patient also has Crohn's disease, hypertension, hypercholesterolemia, depression and  anxiety.   REVIEW OF SYSTEMS: Full 14 system review of systems performed and negative with exception of: Memory loss confusion headache numbness weakness dizziness slurred speech stiff of swallowing passing out restless legs depression anxiety to much sleep decreased energy change in appetite disinterest activity is already scheduled surgery joint pain aching muscles feeling hot and cold cough blurred vision loss of vision eye pain weight loss fatigue chest pain hearing loss ringing in ears spinning sensation.   ALLERGIES: Allergies  Allergen Reactions  . Lovastatin Other (See Comments)    Chest pain  . Naproxen Other (See Comments)    Chest pain/ SOB   . Toradol [Ketorolac Tromethamine] Other (See Comments)    Headache. Non-effective  . Tramadol Other (See Comments)    Headache. Non-effective.  Marland Kitchen Nexium [Esomeprazole Magnesium] Diarrhea    Abdominal pain    HOME MEDICATIONS: Outpatient Medications Prior to Visit  Medication Sig Dispense Refill  . albuterol (VENTOLIN HFA) 108 (90 Base) MCG/ACT inhaler INHALE TWO PUFFS BY MOUTH EVERY 4 HOURS AS NEEDED FOR WHEEZING 18 each 2  . calcium carbonate (TUMS - DOSED IN MG ELEMENTAL CALCIUM) 500 MG chewable tablet Chew 1-2 tablets by mouth 2 (two) times daily as needed for indigestion or heartburn.    . cholestyramine (QUESTRAN) 4 GM/DOSE powder DISSOLVE AND TAKE 2 GRAMS BY MOUTH TWICE DAILY WITH A MEAL - DO NOT TAKE WITHIN 2 HOURS OF OTHER MEDICATIONS 378 g 3  . cyclobenzaprine (FLEXERIL) 5 MG tablet Take 5 mg by mouth 2 (two) times daily as needed for muscle spasms.     Marland Kitchen dexlansoprazole (DEXILANT) 60 MG capsule 1 PO EVERY MORNING WITH BREAKFAST. 30 capsule 11  .  diazepam (VALIUM) 5 MG tablet TAKE ONE TABLET BY MOUTH EVERY 12 HOURS AS NEEDED (  MUST  BE  30  DAYS  APART) 30 tablet 2  . fluticasone (FLONASE) 50 MCG/ACT nasal spray USE TWO SPRAY(S) IN EACH NOSTRIL ONCE DAILY AS NEEDED FOR ALLERGY OR RHINITIS 16 g 0  . gabapentin (NEURONTIN) 400  MG capsule Take 1 capsule by mouth 3 (three) times daily.    Marland Kitchen HYDROcodone-acetaminophen (NORCO) 10-325 MG per tablet Take 1 tablet by mouth every 6 (six) hours as needed for moderate pain.    Marland Kitchen ketoconazole (NIZORAL) 2 % shampoo Apply 1 application topically 2 (two) times a week. For 4 weeks1 120 mL 1  . lidocaine (XYLOCAINE) 2 % solution 2 TSP  PO 30 MINUTES PRIOR TO MEALS AND AT BEDTIME PRN FOR ABDOMINAL OR CHEST PAIN. MAY REPEAT DOSE EVERY 4 HOURS. NO MORE THAN 8 DOSES A DAY. 300 mL 1  . lidocaine (XYLOCAINE) 2 % solution 2 TSP PO 30 MINS PRIOR TO MEALS AND QHS PRN FOR ABDOMINAL OR CHEST PAIN. MAY REPEAT DOSE EVERY 4 HRS. NO MORE THAN 8 DOSES A DAY. 300 mL 1  . losartan-hydrochlorothiazide (HYZAAR) 50-12.5 MG tablet Take 1 tablet by mouth daily. 30 tablet 3  . nystatin (MYCOSTATIN) 100000 UNIT/ML suspension TAKE  5 ML BY MOUTH 4 TIMES DAILY AS NEEDED 180 mL 0  . oxyCODONE-acetaminophen (PERCOCET) 10-325 MG tablet Take 1 tablet by mouth every 4 (four) hours as needed for pain.    . Polyethylene Glycol 400 (BLINK TEARS) 0.25 % GEL Apply 0.25 % to eye daily.    Marland Kitchen POTASSIUM PO Take 1 tablet by mouth daily. Over the counter.    . Probiotic Product (PROBIOTIC DAILY PO) Take by mouth at bedtime.    . promethazine (PHENERGAN) 12.5 MG tablet 1-2 po q4-6 h prn nausea or vomiting 60 tablet 3  . SYMBICORT 80-4.5 MCG/ACT inhaler INHALE TWO PUFFS BY MOUTH TWICE DAILY 1 Inhaler 11   No facility-administered medications prior to visit.     PAST MEDICAL HISTORY: Past Medical History:  Diagnosis Date  . Allergic rhinitis   . Anastomotic ulcer JAN 2009  . Anxiety   . Asthma   . BMI (body mass index) 20.0-29.9 2009 121 lbs  . 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 Crohn disease ([place])   . Crohn disease ([place])   . CTS (carpal tunnel syndrome)   . Diarrhea MULITIFACTORIAL   IBS, LACTOSE INTOLERANCE, SBBO, BILE-SALT  . Elevated liver enzymes 2009: BMI 24 ?Etoh ALT 94, AST 51,  NEG ANA, qIGs& ASMA   MAY  2012 AST 52 ALT 38  . Esophageal stricture 2009  . GERD (gastroesophageal reflux disease)   . Hemorrhoid   . Hyperlipemia   . Hypertension   . Inflammatory bowel disease 2006 CD   SURGICAL REMISSION  . LBP (low back pain)   . Migraine   . PONV (postoperative nausea and vomiting)   . Shortness of breath    exertion and humidity  . Sleep apnea    Stop [name]    PAST SURGICAL HISTORY: Past Surgical History:  Procedure Laterality Date  . BACK SURGERY    . BILATERAL SALPINGOOPHORECTOMY    . BIOPSY  12/24/2011   Procedure: BIOPSY;  Surgeon: Danie Binder, MD;  Location: [facility];  Service: Endoscopy;;  Gastric Biopsies  . BIOPSY N/A 06/30/2012   Procedure: BIOPSY;  Surgeon: Danie Binder, MD;  Location:  [facility];  Service: Endoscopy;  Laterality: N/A;  Gastric and Esophageal Biopsies  . CARPAL TUNNEL RELEASE  LEFT  . COLON SURGERY    . COLONOSCOPY  JAN 2009 DIARRHEA, ABD PAIN, BRBPR   ANASTOMOTIC ULCER(3MM), IH XB:LTJQZE ULCER, NL COLON bX  . DILATION AND CURETTAGE OF UTERUS    . ESOPHAGEAL DILATION  12/24/2011   SLF: A stricture was found in the distal esophagus/ Moderate gastritis/ MILD Duodenitis  . ESOPHAGOGASTRODUODENOSCOPY  02/07/09   mild gastritis  . ESOPHAGOGASTRODUODENOSCOPY (EGD) WITH PROPOFOL N/A 06/30/2012   SLF: 1. No definite stricture appreciated 2. small hiatal hernia 3. Gastritis  . ESOPHAGOGASTRODUODENOSCOPY (EGD) WITH PROPOFOL N/A 02/20/2016   Procedure: ESOPHAGOGASTRODUODENOSCOPY (EGD) WITH PROPOFOL;  Surgeon: Danie Binder, MD;  Location: [facility];  Service: Endoscopy;  Laterality: N/A;  930  . gum flap Bilateral   . HEMICOLECTOMY  RIGHT 2006  . HERNIA REPAIR    . LUMBAR DISC SURGERY    . SAVORY DILATION N/A 06/30/2012   Procedure: SAVORY DILATION;  Surgeon: Danie Binder, MD;  Location: [facility];  Service: Endoscopy;  Laterality: N/A;  12.55m , 185m 1564m. SAVORY DILATION N/A 02/20/2016   Procedure: SAVORY DILATION;  Surgeon: SanDanie BinderMD;  Location: [facility];  Service: Endoscopy;  Laterality: N/A;  . UPPER GASTROINTESTINAL ENDOSCOPY  JAN 2009 ABD PAIN   GASTRITIS, ESO RING  . UPPER GASTROINTESTINAL ENDOSCOPY  OCT 2010 ABD PAIN, DYSPHAGIA   DIL 15MM, GASTRITIS, NL DUODENUM  . UPPER GASTROINTESTINAL ENDOSCOPY  OCT 2010 DYSPHAGIA   DIL 17 MM, GASTRITIS, NL DUODENUM  . vocal cord surgery  Sep 20, 2010   precancerous areas removed  . wisdom tooth extraction Right    pin placement due to broken jaw    FAMILY HISTORY: Family History  Problem Relation Age of Onset  . Hypothyroidism Mother   . Heart disease Father   . Parkinson's disease Father   . Hypothyroidism Daughter   . Colon cancer Neg Hx   . Colon polyps Neg Hx     SOCIAL HISTORY:  Social History   Social History  . Marital status: Married    Spouse name: WayPatrick [name] Number of children: 2  . Years of education: GED   Occupational History  .      disabled   Social History Main Topics  . Smoking status: Former Smoker    Packs/day: 0.50    Years: 30.00    Types: Cigarettes    Quit date: 01/28/2016  . Smokeless tobacco: Never Used  . Alcohol use No  . Drug use: No  . Sexual activity: Yes    Birth control/ protection: Post-menopausal   Other Topics Concern  . Not on file   Social History Narrative   Lives with husband   no caffiene     PHYSICAL EXAM  GENERAL EXAM/CONSTITUTIONAL: Vitals:  Vitals:   06/04/16 0908  BP: 123/74  Pulse: 88  Weight: 104 lb 3.2 oz (47.3 kg)  Height: 5' (1.524 m)     Body mass index is 20.35 kg/m.  Visual Acuity Screening   Right eye Left eye Both eyes  Without correction: 20/200 20/50   With correction:        Patient is in no distress; well developed, nourished and groomed; neck is supple  CARDIOVASCULAR:  Examination of carotid arteries is normal; no carotid bruits  Regular rate and rhythm, no murmurs  Examination of peripheral vascular system by observation  and palpation is  normal  EYES:  Ophthalmoscopic exam of optic discs and posterior segments is normal; no papilledema or hemorrhages  PHOTOSENSITIVE  MUSCULOSKELETAL:  Gait, strength, tone, movements noted in Neurologic exam below  NEUROLOGIC: MENTAL STATUS:  No flowsheet data found.  awake, alert, oriented to person, place and time  recent and remote memory intact  normal attention and concentration  language fluent, comprehension intact, naming intact,   fund of knowledge appropriate  CRANIAL NERVE:   2nd - no papilledema on fundoscopic exam  2nd, 3rd, 4th, 6th - pupils equal and reactive to light, visual fields full to confrontation, extraocular muscles intact, no nystagmus  5th - facial sensation symmetric  7th - facial strength symmetric  8th - hearing intact  9th - palate elevates symmetrically, uvula midline  11th - shoulder shrug symmetric  12th - tongue protrusion midline  MOTOR:   normal bulk and tone, full strength in the BUE, BLE  SENSORY:   normal and symmetric to light touch, temperature, vibration  COORDINATION:   finger-nose-finger, fine finger movements SLOW   REFLEXES:   deep tendon reflexes TRACE and symmetric  GAIT/STATION:   narrow based gait; ANTALGIC GAIT; SLOW CAUTIOUS GAIT    DIAGNOSTIC DATA (LABS, IMAGING, TESTING) - I reviewed patient records, labs, notes, testing and imaging myself where available.  Lab Results  Component Value Date   WBC 3.2 (L) 05/21/2016   HGB 13.8 05/21/2016   HCT 39.5 05/21/2016   MCV 102.1 (H) 05/21/2016   PLT 215 05/21/2016      Component Value Date/Time   NA 136 05/21/2016 1234   K 4.2 05/21/2016 1234   CL 103 05/21/2016 1234   CO2 24 05/21/2016 1234   GLUCOSE 80 05/21/2016 1234   BUN 7 05/21/2016 1234   CREATININE 0.46 05/21/2016 1234   CREATININE 0.58 07/04/2015 0849   CALCIUM 8.5 (L) 05/21/2016 1234   PROT 7.1 02/09/2016 1329   ALBUMIN 4.2 02/09/2016 1329   AST 220 (H) 02/09/2016 1329    ALT 144 (H) 02/09/2016 1329   ALKPHOS 121 02/09/2016 1329   BILITOT 1.2 02/09/2016 1329   GFRNONAA >60 05/21/2016 1234   GFRAA >60 05/21/2016 1234   Lab Results  Component Value Date   CHOL 154 07/04/2015   HDL 87 07/04/2015   LDLCALC 39 07/04/2015   LDLDIRECT 29 03/11/2011   TRIG 142 07/04/2015   CHOLHDL 1.8 07/04/2015   No results found for: HGBA1C Lab Results  Component Value Date   VITAMINB12 461 02/09/2016   Lab Results  Component Value Date   TSH 1.010 12/27/2011    05/21/16 MRI brain [I reviewed images myself and agree with interpretation. -VRP]  - Mild atrophy and mild chronic microvascular ischemia. No acute abnormality. - Mild mucosal edema paranasal sinuses.     ASSESSMENT AND PLAN  61 y.o. year old female here with new onset loss of consciousness, head trauma, possible seizure, with postconcussion syndrome symptoms. Unclear cause of the initial fall/syncope. Patient may have had a seizure which caused the fall. Patient may have had a syncopal event, and then traumatic seizure. Patient may have had convulsive syncope. In either case patient has had appropriate workup with MRI and EEG. Since this was a new onset event I do not recommend antiseizure medicine at this time unless EEG is abnormal or patient has a second event. She may benefit from PCP or cardiology follow-up and workup of medical or cardiac causes of syncope.    Ddx: syncope, possible  new onset seizure, concussion, post-concussion syndrome  1. New onset seizure ([place])   2. Dizziness   3. Gait difficulty   4. Post concussion syndrome   5. 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 Ambulatory referral to Physical Therapy   Return for return to Dr. Buelah Manis (PCP) and Dr. Merlene Laughter (neurology).    Penni Bombard, MD 7/50/5183, 3:58 AM Certified in Neurology, Neurophysiology and Neuroimaging  [facility] [address], [place], [place] [address] [phone]

## 2016-06-04 NOTE — Patient Instructions (Addendum)
Thank you for coming to see [place] at [facility]. I hope we have been able to provide you high quality care today.  You may receive a patient satisfaction survey over the next few weeks. We would appreciate your feedback and comments so that we may continue to improve ourselves and the health of our patients.  - follow up EEG with Dr. Merlene Laughter  - follow up with Dr. Buelah Manis (PCP) or cardiology re: syncope (fainting spell) workup/testing  - no driving x 6 months  - monitor symptoms  - no anti-seizure meds unless EEG is abnormal or there is a 2nd seizure  - conservative mgmt of post-concussion syndrome (physical therapy, cane/walker, gentle exercises, relaxation techniques)   ~~~~~~~~~~~~~~~~~~~~~~~~~~~~~~~~~~~~~~~~~~~~~~~~~~~~~~~~~~~~~~~~~  DR. Keishawn Darsey'S GUIDE TO HAPPY AND HEALTHY LIVING These are some of my general health and wellness recommendations. Some of them may apply to you better than others. Please use common sense as you try these suggestions and feel free to ask me any questions.   ACTIVITY/FITNESS Mental, social, emotional and physical stimulation are very important for brain and body health. Try learning a new activity (arts, music, language, sports, games).  Keep moving your body to the best of your abilities. You can do this at home, inside or outside, the park, community center, gym or anywhere you like. Consider a physical therapist or personal trainer to get started. Consider the app Sworkit. Fitness trackers such as smart-watches, smart-phones or Fitbits can help as well.   NUTRITION Eat more plants: colorful vegetables, nuts, seeds and berries.  Eat less sugar, salt, preservatives and processed foods.  Avoid toxins such as cigarettes and alcohol.  Drink water when you are thirsty. Warm water with a slice of lemon is an excellent morning drink to start the day.  Consider these websites for more information The Nutrition Source  (https://www.henry-hernandez.biz/) Precision Nutrition (WindowBlog.ch)   RELAXATION Consider practicing mindfulness meditation or other relaxation techniques such as deep breathing, prayer, yoga, tai chi, massage. See website mindful.org or the apps Headspace or Calm to help get started.   SLEEP Try to get at least 7-8+ hours sleep per day. Regular exercise and reduced caffeine will help you sleep better. Practice good sleep hygeine techniques. See website sleep.org for more information.   PLANNING Prepare estate planning, living will, healthcare POA documents. Sometimes this is best planned with the help of an attorney. Theconversationproject.org and agingwithdignity.org are excellent resources.

## 2016-06-07 DIAGNOSIS — M545 Low back pain: Secondary | ICD-10-CM | POA: Diagnosis not present

## 2016-06-07 DIAGNOSIS — M79673 Pain in unspecified foot: Secondary | ICD-10-CM | POA: Diagnosis not present

## 2016-06-07 DIAGNOSIS — Z79891 Long term (current) use of opiate analgesic: Secondary | ICD-10-CM | POA: Diagnosis not present

## 2016-06-07 DIAGNOSIS — K589 Irritable bowel syndrome without diarrhea: Secondary | ICD-10-CM | POA: Diagnosis not present

## 2016-06-07 DIAGNOSIS — R1084 Generalized abdominal pain: Secondary | ICD-10-CM | POA: Diagnosis not present

## 2016-06-07 DIAGNOSIS — M542 Cervicalgia: Secondary | ICD-10-CM | POA: Diagnosis not present

## 2016-06-07 DIAGNOSIS — J449 Chronic obstructive pulmonary disease, unspecified: Secondary | ICD-10-CM | POA: Diagnosis not present

## 2016-06-07 DIAGNOSIS — K50912 Crohn's disease, unspecified, with intestinal obstruction: Secondary | ICD-10-CM | POA: Diagnosis not present

## 2016-06-07 DIAGNOSIS — F0781 Postconcussional syndrome: Secondary | ICD-10-CM | POA: Diagnosis not present

## 2016-06-18 ENCOUNTER — Ambulatory Visit (HOSPITAL_COMMUNITY): Payer: Medicare Other | Attending: Diagnostic Neuroimaging | Admitting: Physical Therapy

## 2016-06-18 DIAGNOSIS — R2681 Unsteadiness on feet: Secondary | ICD-10-CM | POA: Diagnosis not present

## 2016-06-18 DIAGNOSIS — R262 Difficulty in walking, not elsewhere classified: Secondary | ICD-10-CM

## 2016-06-18 NOTE — Patient Instructions (Addendum)
Movements: Eyes Only (Pictorial Reference)    Therapist: Use this card with Eye Exercises 14 through 17.   Copyright  VHI. All rights reserved.  Oculomotor: Smooth Pursuits    Holding a target, keep eyes on target and slowly move target side to side with head still. Perform sitting. Repeat __10__ times per session. Do _3___ sessions per day. Repeat using target on pattern background.  Copyright  VHI. All rights reserved.

## 2016-06-19 NOTE — Therapy (Signed)
[place], [place], [address] Phone: [phone]   Fax:  [phone]  Physical Therapy Evaluation  Patient Details  Name: [name] [name] MRN: [id number] Date of Birth: 01-Jul-1955 Referring Provider: Andrey Spearman  Encounter Date: 06/18/2016      PT End of Session - 06/18/16 1125    Visit Number 1   Number of Visits 8   Date for PT Re-Evaluation 07/18/16   Authorization Type Medicare   Authorization - Visit Number 1   Authorization - Number of Visits 8   PT Start Time 8937   PT Stop Time 1130   PT Time Calculation (min) 50 min   Activity Tolerance Patient tolerated treatment well      Past Medical History:  Diagnosis Date  . Allergic rhinitis   . Anastomotic ulcer JAN 2009  . Anxiety   . Asthma   . BMI (body mass index) 20.0-29.9 2009 121 lbs  . 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 Crohn disease ([place])   . Crohn disease ([place])   . CTS (carpal tunnel syndrome)   . Diarrhea MULITIFACTORIAL   IBS, LACTOSE INTOLERANCE, SBBO, BILE-SALT  . Elevated liver enzymes 2009: BMI 24 ?Etoh ALT 94, AST 51,  NEG ANA, qIGs& ASMA   MAY 2012 AST 52 ALT 38  . Esophageal stricture 2009  . GERD (gastroesophageal reflux disease)   . Hemorrhoid   . Hyperlipemia   . Hypertension   . Inflammatory bowel disease 2006 CD   SURGICAL REMISSION  . LBP (low back pain)   . Migraine   . PONV (postoperative nausea and vomiting)   . Shortness of breath    exertion and humidity  . Sleep apnea    Stop [name]    Past Surgical History:  Procedure Laterality Date  . BACK SURGERY    . BILATERAL SALPINGOOPHORECTOMY    . BIOPSY  12/24/2011   Procedure: BIOPSY;  Surgeon: Danie Binder, MD;  Location: [facility];  Service: Endoscopy;;  Gastric Biopsies  . BIOPSY N/A 06/30/2012   Procedure: BIOPSY;  Surgeon: Danie Binder, MD;  Location: [facility];  Service: Endoscopy;  Laterality: N/A;  Gastric and Esophageal Biopsies  .  CARPAL TUNNEL RELEASE  LEFT  . COLON SURGERY    . COLONOSCOPY  JAN 2009 DIARRHEA, ABD PAIN, BRBPR   ANASTOMOTIC ULCER(3MM), IH DS:KAJGOT ULCER, NL COLON bX  . DILATION AND CURETTAGE OF UTERUS    . ESOPHAGEAL DILATION  12/24/2011   SLF: A stricture was found in the distal esophagus/ Moderate gastritis/ MILD Duodenitis  . ESOPHAGOGASTRODUODENOSCOPY  02/07/09   mild gastritis  . ESOPHAGOGASTRODUODENOSCOPY (EGD) WITH PROPOFOL N/A 06/30/2012   SLF: 1. No definite stricture appreciated 2. small hiatal hernia 3. Gastritis  . ESOPHAGOGASTRODUODENOSCOPY (EGD) WITH PROPOFOL N/A 02/20/2016   Procedure: ESOPHAGOGASTRODUODENOSCOPY (EGD) WITH PROPOFOL;  Surgeon: Danie Binder, MD;  [facility];  Service: Endoscopy;  Laterality: N/A;  930  . gum flap Bilateral   . HEMICOLECTOMY  RIGHT 2006  . HERNIA REPAIR    . LUMBAR DISC SURGERY    . SAVORY DILATION N/A 06/30/2012   Procedure: SAVORY DILATION;  Surgeon: Danie Binder, MD;  Location: [facility];  Service: Endoscopy;  Laterality: N/A;  12.68m , 168m 1574m. SAVORY DILATION N/A 02/20/2016   Procedure: SAVORY DILATION;  Surgeon: SanDanie BinderD;  [facility];  Service: Endoscopy;  Laterality: N/A;  .  UPPER GASTROINTESTINAL ENDOSCOPY  JAN 2009 ABD PAIN   GASTRITIS, ESO RING  . UPPER GASTROINTESTINAL ENDOSCOPY  OCT 2010 ABD PAIN, DYSPHAGIA   DIL 15MM, GASTRITIS, NL DUODENUM  . UPPER GASTROINTESTINAL ENDOSCOPY  OCT 2010 DYSPHAGIA   DIL 17 MM, GASTRITIS, NL DUODENUM  . vocal cord surgery  Sep 20, 2010   precancerous areas removed  . wisdom tooth extraction Right    pin placement due to broken jaw    There were no vitals filed for this visit.       Subjective Assessment - 06/18/16 1042    Subjective [name] [name] states that she fell due to pure exhaustion at her father's funeral in December and hit her head.  She states that since this occurred she has had headaches, has been dizzy and has been aching.  She is being referred to  physcial therapy to attempt to improve her safety .  She has had an EEG as well as an MRI both of which was negative.  She has been told that she has a concussion.     Limitations Standing;Walking;House hold activities   How long can you sit comfortably? no problem    How long can you stand comfortably? able to stand for 15 seconds without touching something    How long can you walk comfortably? Able to walk for less than five minutes    Patient Stated Goals to improve her balance, to be able to walk with her dog    Currently in Pain? No/denies  Pt has taken her medication             [facility] PT Assessment - 06/19/16 0001      Assessment   Medical Diagnosis unsteady gait   Referring Provider Vikram Penumalli   Onset Date/Surgical Date 05/05/16   Next MD Visit not scheduled   Prior Therapy not for this diagnosis      Precautions   Precautions None     Restrictions   Weight Bearing Restrictions No     Balance Screen   Has the patient fallen in the past 6 months Yes   How many times? 1   Has the patient had a decrease in activity level because of a fear of falling?  No   Is the patient reluctant to leave their home because of a fear of falling?  Yes     [place] residence     Prior Function   Level of Independence Independent with basic ADLs   Vocation On disability  Do housework a little at a time due to chronic back pain.    Leisure walking dog, taking care of her garden      Cognition   Overall Cognitive Status Within Functional Limits for tasks assessed     Observation/Other Assessments   Focus on Therapeutic Outcomes (FOTO)  40     Functional Tests   Functional tests Single leg stance;Sit to Stand     Single Leg Stance   Comments Lt unable; Rt 8 seconds      Sit to Stand   Comments 5 x in 32.42     ROM / Strength   AROM / PROM / Strength Strength     Strength   Overall Strength Within functional limits for tasks  performed            Vestibular Assessment - 06/19/16 0001      Vestibular Assessment   General Observation --  Pt  has sunglassess      Symptom Behavior   Type of Dizziness Spinning   Frequency of Dizziness --  always dizzy but has bouts where intensity is greater    Duration of Dizziness --  does not know for sure but about five minutes    Aggravating Factors Lying supine;Moving eyes;Turning body quickly;Supine to sit;Rolling to right;Forward bending     Occulomotor Exam   Occulomotor Alignment Normal   Smooth Pursuits Saccades   Saccades --  unable to test as pt keeps closing her eyes .      Positional Testing   Dix-Hallpike Dix-Hallpike Right;Dix-Hallpike Left   Sidelying Test Sidelying Right;Sidelying Left   Horizontal Canal Testing Horizontal Canal Right;Horizontal Canal Left     Dix-Hallpike Right   Dix-Hallpike Right Duration Pt complains of significant  dizziness but has no nystagmus    Dix-Hallpike Right Symptoms No nystagmus     Dix-Hallpike Left   Dix-Hallpike Left Symptoms No nystagmus     Sidelying Right   Sidelying Right Duration 10   Sidelying Right Symptoms No nystagmus     Sidelying Left   Sidelying Left Symptoms No nystagmus     Horizontal Canal Right   Horizontal Canal Right Duration negative   Horizontal Canal Right Symptoms Normal     Horizontal Canal Left   Horizontal Canal Left Duration negative    Horizontal Canal Left Symptoms Normal     Positional Sensitivities   Sit to Supine Mild dizziness   Supine to Left Side Severe dizziness   Supine to Sitting Severe dizziness                       PT Education - 06/18/16 1124    Education provided Yes   Education Details eye exercise   Person(s) Educated Patient   Methods Explanation   Comprehension Verbalized understanding          PT Short Term Goals - 06/18/16 0814      PT SHORT TERM GOAL #1   Title Pt to be able to stand without UE assist for five minutes  to allow completion of self grooming with improved safety   Time 2   Period Weeks   Status New     PT SHORT TERM GOAL #2   Title PT to state that her dizziness frequency and intensity have decreased by 25%  to reduce risk of falling    Time 2   Period Weeks   Status New     PT SHORT TERM GOAL #3   Title Pt to be able to walk for 10 minutes without holding onto walls to reduce risk of falling.    Time 2   Period Weeks   Status New           PT Long Term Goals - 06/19/16 0817      PT LONG TERM GOAL #1   Title Pt to be able to stand without UE support for 15 minutes to be able to make a small meal without risk of falling    Time 4   Period Weeks   Status New     PT LONG TERM GOAL #2   Title PT to have resumed driving    Time 4   Period Weeks   Status New     PT LONG TERM GOAL #3   Title Pt to be able to single leg stance for 20 seconds B for reduce risk of falling  Time 4   Period Weeks   Status New               Plan - 06/18/16 1345    Clinical Impression Statement [name] [name] is a 61 yo female who fell, due to exhaustion,(pt had not been sleeping due to father's death), and hit the front of her head.  Since this time she has had significant balance and vertigo problems.  She tested negative for all BPPV.  [name] [name] will benefit from skilled physical therapy to improve her balance to decrease her fall risk and improve her functional mobility.    Rehab Potential Good   PT Frequency 2x / week   PT Duration 4 weeks   PT Treatment/Interventions ADLs/Self Care Home Management;Canalith Repostioning;Gait training;Stair training;Functional mobility training;Therapeutic activities;Therapeutic exercise;Balance training;Neuromuscular re-education;Patient/family education;Manual techniques   PT Next Visit Plan Begin standing without UE support, progress to UE movement with standing.  Standing with head turns.  Narrow base of support and rotational exerices progressing to  tandem, retro and sidestepping.      Patient will benefit from skilled therapeutic intervention in order to improve the following deficits and impairments:  Abnormal gait, Decreased activity tolerance, Decreased balance, Difficulty walking  Visit Diagnosis: Unsteadiness on feet - Plan: PT plan of care cert/re-cert  Difficulty in walking, not elsewhere classified - Plan: PT plan of care cert/re-cert      G-Codes - 67/34/19 0820    Functional Assessment Tool Used Clinical judgement inability to walk without holding onto walls    Functional Limitation Mobility: Walking and moving around   Mobility: Walking and Moving Around Current Status [phone]) At least 60 percent but less than 80 percent impaired, limited or restricted   Mobility: Walking and Moving Around Goal Status [phone]) At least 20 percent but less than 40 percent impaired, limited or restricted       Problem List Patient Active Problem List   Diagnosis Date Noted  . Loss of weight 07/04/2015  . Osteoarthritis 12/27/2014  . Allergic dermatitis eyelid 12/27/2014  . Impetigo 04/05/2014  . Ganglion cyst of wrist 04/05/2014  . Abnormal CT scan, chest 01/12/2014  . Thrush 10/25/2013  . Seborrheic dermatitis of scalp 04/12/2013  . Infected insect bite of left breast 11/17/2012  . Polycythemia 09/08/2012  . Vertigo 01/22/2012  . Neck pain 01/22/2012  . Chronic pain 12/27/2011  . Cigarette smoker 05/13/2011  . Dysphagia 03/14/2011  . DEPRESSION 04/24/2010  . Dyspnea 08/04/2009  . Osteoporosis 11/11/2008  . BACK PAIN, LUMBAR, WITH RADICULOPATHY 07/28/2008  . TRANSAMINASES, SERUM, ELEVATED 03/18/2008  . GENITAL HERPES 09/29/2007  . 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 Hyperlipidemia 02/03/2007  . VARCIES, SITE [place] 09/30/2006  . ANXIETY 02/27/2006  . Essential hypertension 02/27/2006  . Allergic rhinitis 02/27/2006  . GERD 02/27/2006  . Crohn's disease of small intestine with complication [facility])  53/29/9242    Rayetta Humphrey, PT CLT [phone] 06/19/2016, 11:04 AM  [place], [place], [address] Phone: [phone]   Fax:  [phone]  Name: [name] [name] MRN: [id number] Date of Birth: 1955/10/15

## 2016-06-21 ENCOUNTER — Ambulatory Visit (HOSPITAL_COMMUNITY): Payer: Medicare Other | Admitting: Physical Therapy

## 2016-06-21 DIAGNOSIS — R262 Difficulty in walking, not elsewhere classified: Secondary | ICD-10-CM

## 2016-06-21 DIAGNOSIS — R2681 Unsteadiness on feet: Secondary | ICD-10-CM | POA: Diagnosis not present

## 2016-06-21 NOTE — Patient Instructions (Addendum)
Feet Together (Compliant Surface) Arm Motion - Eyes Open   Stand with the bed or large chair behind you.  With eyes open, standing on the floor :________, feet together, move arms up and down: to front. Repeat __10__ times per session. Do ___2_ sessions per day.  Copyright  VHI. All rights reserved.  Rolling    With pillow under head, start on back. Roll slowly to right. Hold position until symptoms subside. Roll slowly onto left side. Hold position until symptoms subside. Repeat sequence 5____ times per session. Do ____ sessions per day.  Copyright  VHI. All rights reserved.

## 2016-06-21 NOTE — Therapy (Signed)
[place], [place], [address] Phone: [phone]   Fax:  [phone]  Physical Therapy Treatment  Patient Details  Name: [name] [name] MRN: [id number] Date of Birth: 10/24/1955 Referring Provider: Andrey Spearman  Encounter Date: 06/21/2016      PT End of Session - 06/21/16 1111    Visit Number 2   Number of Visits 8   Date for PT Re-Evaluation 07/18/16   Authorization Type Medicare   Authorization - Visit Number 2   Authorization - Number of Visits 8   PT Start Time 1034   PT Stop Time 1113   PT Time Calculation (min) 39 min   Activity Tolerance Patient tolerated treatment well   Behavior During Therapy [facility] for tasks assessed/performed      Past Medical History:  Diagnosis Date  . Allergic rhinitis   . Anastomotic ulcer JAN 2009  . Anxiety   . Asthma   . BMI (body mass index) 20.0-29.9 2009 121 lbs  . 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 Crohn disease ([place])   . Crohn disease ([place])   . CTS (carpal tunnel syndrome)   . Diarrhea MULITIFACTORIAL   IBS, LACTOSE INTOLERANCE, SBBO, BILE-SALT  . Elevated liver enzymes 2009: BMI 24 ?Etoh ALT 94, AST 51,  NEG ANA, qIGs& ASMA   MAY 2012 AST 52 ALT 38  . Esophageal stricture 2009  . GERD (gastroesophageal reflux disease)   . Hemorrhoid   . Hyperlipemia   . Hypertension   . Inflammatory bowel disease 2006 CD   SURGICAL REMISSION  . LBP (low back pain)   . Migraine   . PONV (postoperative nausea and vomiting)   . Shortness of breath    exertion and humidity  . Sleep apnea    Stop [name]    Past Surgical History:  Procedure Laterality Date  . BACK SURGERY    . BILATERAL SALPINGOOPHORECTOMY    . BIOPSY  12/24/2011   Procedure: BIOPSY;  Surgeon: Danie Binder, MD;  [facility]: [facility];  Service: Endoscopy;;  Gastric Biopsies  . BIOPSY N/A 06/30/2012   Procedure: BIOPSY;  Surgeon: Danie Binder, MD;  Location: [facility];  Service:  Endoscopy;  Laterality: N/A;  Gastric and Esophageal Biopsies  . CARPAL TUNNEL RELEASE  LEFT  . COLON SURGERY    . COLONOSCOPY  JAN 2009 DIARRHEA, ABD PAIN, BRBPR   ANASTOMOTIC ULCER(3MM), IH JS:EGBTDV ULCER, NL COLON bX  . DILATION AND CURETTAGE OF UTERUS    . ESOPHAGEAL DILATION  12/24/2011   SLF: A stricture was found in the distal esophagus/ Moderate gastritis/ MILD Duodenitis  . ESOPHAGOGASTRODUODENOSCOPY  02/07/09   mild gastritis  . ESOPHAGOGASTRODUODENOSCOPY (EGD) WITH PROPOFOL N/A 06/30/2012   SLF: 1. No definite stricture appreciated 2. small hiatal hernia 3. Gastritis  . ESOPHAGOGASTRODUODENOSCOPY (EGD) WITH PROPOFOL N/A 02/20/2016   Procedure: ESOPHAGOGASTRODUODENOSCOPY (EGD) WITH PROPOFOL;  Surgeon: Danie Binder, MD;  [facility];  Service: Endoscopy;  Laterality: N/A;  930  . gum flap Bilateral   . HEMICOLECTOMY  RIGHT 2006  . HERNIA REPAIR    . LUMBAR DISC SURGERY    . SAVORY DILATION N/A 06/30/2012   Procedure: SAVORY DILATION;  Surgeon: Danie Binder, MD;  Location: [facility];  Service: Endoscopy;  Laterality: N/A;  12.51m , 139m 1561m. SAVORY DILATION N/A 02/20/2016   Procedure: SAVORY DILATION;  Surgeon: SanDanie BinderD;  [facility]  [facility];  Service: Endoscopy;  Laterality: N/A;  . UPPER GASTROINTESTINAL ENDOSCOPY  JAN 2009 ABD PAIN   GASTRITIS, ESO RING  . UPPER GASTROINTESTINAL ENDOSCOPY  OCT 2010 ABD PAIN, DYSPHAGIA   DIL 15MM, GASTRITIS, NL DUODENUM  . UPPER GASTROINTESTINAL ENDOSCOPY  OCT 2010 DYSPHAGIA   DIL 17 MM, GASTRITIS, NL DUODENUM  . vocal cord surgery  Sep 20, 2010   precancerous areas removed  . wisdom tooth extraction Right    pin placement due to broken jaw    There were no vitals filed for this visit.      Subjective Assessment - 06/21/16 1036    Subjective Pt states that she has been doing her exercises.  Pt comes to department without an assistive device.    Limitations Standing;Walking;House hold activities   How long  can you sit comfortably? no problem    How long can you stand comfortably? able to stand for 15 seconds without touching something    How long can you walk comfortably? Able to walk for less than five minutes    Patient Stated Goals to improve her balance, to be able to walk with her dog    Currently in Pain? No/denies  Has taken a pain pill                               Balance Exercises - 06/21/16 1045      Balance Exercises: Standing   Standing Eyes Opened Narrow base of support (BOS);Head turns;5 reps   Tandem Stance Eyes open;5 reps   SLS Eyes open;4 reps   Balance Master: Limits for Stability --  functional squat x 10     Tandem Gait Forward;Retro;2 reps   Sidestepping 2 reps   Marching Limitations x10   Heel Raises Limitations x10    Sit to Stand Time x10     Balance Exercises: Supine   Supine to Sidelying x 5 both directions            PT Education - 06/21/16 1111    Education provided Yes   Education Details new HEP    Person(s) Educated Patient   Methods Explanation;Handout   Comprehension Verbalized understanding          PT Short Term Goals - 06/21/16 1113      PT SHORT TERM GOAL #1   Title Pt to be able to stand without UE assist for five minutes to allow completion of self grooming with improved safety   Time 2   Period Weeks   Status On-going     PT SHORT TERM GOAL #2   Title PT to state that her dizziness frequency and intensity have decreased by 25%  to reduce risk of falling    Time 2   Period Weeks   Status On-going     PT SHORT TERM GOAL #3   Title Pt to be able to walk for 10 minutes without holding onto walls to reduce risk of falling.    Time 2   Period Weeks   Status On-going           PT Long Term Goals - 06/21/16 1114      PT LONG TERM GOAL #1   Title Pt to be able to stand without UE support for 15 minutes to be able to make a small meal without risk of falling    Time 4   Period Weeks  Status  On-going     PT LONG TERM GOAL #2   Title PT to have resumed driving    Time 4   Period Weeks   Status On-going     PT LONG TERM GOAL #3   Title Pt to be able to single leg stance for 20 seconds B for reduce risk of falling    Time 4   Period Weeks   Status On-going               Plan - 06/21/16 1112    Clinical Impression Statement Pt began balance activity as wel as habituation for rolling.  Rolling tasks are the most difficult for pt at this time.  Pt needs minimum assist with standing activity for safety.    Rehab Potential Good   PT Frequency 2x / week   PT Duration 4 weeks   PT Treatment/Interventions ADLs/Self Care Home Management;Canalith Repostioning;Gait training;Stair training;Functional mobility training;Therapeutic activities;Therapeutic exercise;Balance training;Neuromuscular re-education;Patient/family education;Manual techniques   PT Next Visit Plan begin tandem on foam,  Narrow base of support on foam.       Patient will benefit from skilled therapeutic intervention in order to improve the following deficits and impairments:  Abnormal gait, Decreased activity tolerance, Decreased balance, Difficulty walking  Visit Diagnosis: Unsteadiness on feet  Difficulty in walking, not elsewhere classified     Problem List Patient Active Problem List   Diagnosis Date Noted  . Loss of weight 07/04/2015  . Osteoarthritis 12/27/2014  . Allergic dermatitis eyelid 12/27/2014  . Impetigo 04/05/2014  . Ganglion cyst of wrist 04/05/2014  . Abnormal CT scan, chest 01/12/2014  . Thrush 10/25/2013  . Seborrheic dermatitis of scalp 04/12/2013  . Infected insect bite of left breast 11/17/2012  . Polycythemia 09/08/2012  . Vertigo 01/22/2012  . Neck pain 01/22/2012  . Chronic pain 12/27/2011  . Cigarette smoker 05/13/2011  . Dysphagia 03/14/2011  . DEPRESSION 04/24/2010  . Dyspnea 08/04/2009  . Osteoporosis 11/11/2008  . BACK PAIN, LUMBAR, WITH RADICULOPATHY  07/28/2008  . TRANSAMINASES, SERUM, ELEVATED 03/18/2008  . GENITAL HERPES 09/29/2007  . 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 Hyperlipidemia 02/03/2007  . VARCIES, SITE [place] 09/30/2006  . ANXIETY 02/27/2006  . Essential hypertension 02/27/2006  . Allergic rhinitis 02/27/2006  . GERD 02/27/2006  . Crohn's disease of small intestine with complication [facility]) 46/96/2952    Rayetta Humphrey, PT CLT [phone] 06/21/2016, [place], [place], [address] Phone: [phone]   Fax:  [phone]  Name: [name] [name] MRN: [id number] Date of Birth: Sep 09, 1955

## 2016-06-24 ENCOUNTER — Ambulatory Visit (INDEPENDENT_AMBULATORY_CARE_PROVIDER_SITE_OTHER): Payer: Medicare Other | Admitting: Family Medicine

## 2016-06-24 ENCOUNTER — Encounter: Payer: Self-pay | Admitting: Family Medicine

## 2016-06-24 VITALS — BP 138/84 | HR 82 | Temp 98.0°F | Resp 18 | Ht 60.0 in | Wt 105.4 lb

## 2016-06-24 DIAGNOSIS — R42 Dizziness and giddiness: Secondary | ICD-10-CM | POA: Diagnosis not present

## 2016-06-24 DIAGNOSIS — G894 Chronic pain syndrome: Secondary | ICD-10-CM

## 2016-06-24 DIAGNOSIS — J410 Simple chronic bronchitis: Secondary | ICD-10-CM

## 2016-06-24 DIAGNOSIS — I10 Essential (primary) hypertension: Secondary | ICD-10-CM | POA: Diagnosis not present

## 2016-06-24 DIAGNOSIS — Z0000 Encounter for general adult medical examination without abnormal findings: Secondary | ICD-10-CM | POA: Diagnosis not present

## 2016-06-24 DIAGNOSIS — M810 Age-related osteoporosis without current pathological fracture: Secondary | ICD-10-CM | POA: Diagnosis not present

## 2016-06-24 DIAGNOSIS — E782 Mixed hyperlipidemia: Secondary | ICD-10-CM

## 2016-06-24 LAB — CBC WITH DIFFERENTIAL/PLATELET
Basophils Absolute: 0 cells/uL (ref 0–200)
Basophils Relative: 0 %
EOS PCT: 1 %
Eosinophils Absolute: 49 cells/uL (ref 15–500)
HEMATOCRIT: 45.1 % — AB (ref 35.0–45.0)
Hemoglobin: 15 g/dL (ref 12.0–15.0)
LYMPHS PCT: 23 %
Lymphs Abs: 1127 cells/uL (ref 850–3900)
MCH: 35.3 pg — ABNORMAL HIGH (ref 27.0–33.0)
MCHC: 33.3 g/dL (ref 32.0–36.0)
MCV: 106.1 fL — ABNORMAL HIGH (ref 80.0–100.0)
MONO ABS: 588 {cells}/uL (ref 200–950)
MPV: 9.8 fL (ref 7.5–12.5)
Monocytes Relative: 12 %
Neutro Abs: 3136 cells/uL (ref 1500–7800)
Neutrophils Relative %: 64 %
Platelets: 188 10*3/uL (ref 140–400)
RBC: 4.25 MIL/uL (ref 3.80–5.10)
RDW: 14.9 % (ref 11.0–15.0)
WBC: 4.9 10*3/uL (ref 3.8–10.8)

## 2016-06-24 LAB — COMPREHENSIVE METABOLIC PANEL
ALT: 37 U/L — ABNORMAL HIGH (ref 6–29)
AST: 87 U/L — ABNORMAL HIGH (ref 10–35)
Albumin: 4.3 g/dL (ref 3.6–5.1)
Alkaline Phosphatase: 96 U/L (ref 33–130)
BUN: 5 mg/dL — ABNORMAL LOW (ref 7–25)
CHLORIDE: 100 mmol/L (ref 98–110)
CO2: 26 mmol/L (ref 20–31)
CREATININE: 0.49 mg/dL — AB (ref 0.50–1.05)
Calcium: 8.9 mg/dL (ref 8.6–10.4)
Glucose, Bld: 103 mg/dL — ABNORMAL HIGH (ref 70–99)
POTASSIUM: 3.5 mmol/L (ref 3.5–5.3)
SODIUM: 140 mmol/L (ref 135–146)
TOTAL PROTEIN: 6.9 g/dL (ref 6.1–8.1)
Total Bilirubin: 0.5 mg/dL (ref 0.2–1.2)

## 2016-06-24 LAB — LIPID PANEL
CHOLESTEROL: 302 mg/dL — AB (ref <200)
HDL: 184 mg/dL (ref >50)
LDL CALC: 107 mg/dL — AB (ref <100)
TRIGLYCERIDES: 53 mg/dL (ref <150)
Total CHOL/HDL Ratio: 1.6 Ratio (ref <5.0)
VLDL: 11 mg/dL (ref <30)

## 2016-06-24 MED ORDER — LOSARTAN POTASSIUM-HCTZ 50-12.5 MG PO TABS: 1.0000 | ORAL_TABLET | Freq: Every day | ORAL | 3 refills | Status: DC

## 2016-06-24 MED ORDER — MECLIZINE HCL 12.5 MG PO TABS: 12.5000 mg | ORAL_TABLET | Freq: Three times a day (TID) | ORAL | 0 refills | Status: DC | PRN

## 2016-06-24 NOTE — Assessment & Plan Note (Signed)
Continue with neurology  She had mild leukopenia recheck CBC

## 2016-06-24 NOTE — Assessment & Plan Note (Signed)
Recent Reclast, continue calcium and vitamin D

## 2016-06-24 NOTE — Patient Instructions (Addendum)
F/U 6 months  We will call with lab results  Use the meclizine

## 2016-06-24 NOTE — Assessment & Plan Note (Signed)
Controlled no changes

## 2016-06-24 NOTE — Progress Notes (Signed)
Subjective:   Patient presents for Medicare Annual/Subsequent preventive examination.   Since her last visit he was seen in the emergency room after concern for seizure-like activity when she was traveling out of state to a funeral. She was seen by [facility] neurology that she does have a local neurologist who treats her chronic headaches and pain. She had loss of consciousness of unknown reason with postconcussive syndrome symptoms after a fall. After discussio of event,she had flown out of town for her fathers funeral, had many family events, did not sleep well, was very tired and states she passed out.   She had MRI and EEG which were unremarkable. She is advised unless she had another seizure activity she does not need antiseizure medication. Was also recommended that she come in to have medical workup for possible cardiac cause of syncope. She is also seen at the emergency room for dizziness on the 18thEKG was in sinus rhythm however showed incomplete bundle branch block and low voltage which is a noted back on EKG in 2015 Labs from the emergency room showed a mildly low white blood cell count of 3.2 electrolytes were unremarkable She is still followed by Dr. Merlene Laughter, current in Vestibular PT, has not been using valium this makes her feel worse.  COPD- has not been using symbicort, states she feels fine   Review Past Medical/Family/Social: Per EMR    Risk Factors  Current exercise habits: walks  Dietary issues discussed: Yes   Cardiac risk factors: HTN, hyperlipidemia   Depression Screen  (Note: if answer to either of the following is "Yes", a more complete depression screening is indicated)  Over the past two weeks, have you felt down, depressed or hopeless? No Over the past two weeks, have you felt little interest or pleasure in doing things? No Have you lost interest or pleasure in daily life? No Do you often feel hopeless? No Do you cry easily over simple problems? No   Activities  of Daily Living  In your present state of health, do you have any difficulty performing the following activities?:  Driving? Yes  Managing money? No  Feeding yourself? No  Getting from bed to chair? No  Climbing a flight of stairs? No  Preparing food and eating?: No  Bathing or showering? No  Getting dressed: No  Getting to the toilet? No  Using the toilet:No  Moving around from place to place: yes In the past year have you fallen or had a near fall?:yes Are you sexually active? No  Do you have more than one partner? No   Hearing Difficulties: Yes  Do you often ask people to speak up or repeat themselves? Yes Do you experience ringing or noises in your ears? No Do you have difficulty understanding soft or whispered voices? No  Do you feel that you have a problem with memory? No Do you often misplace items? Yes Do you feel safe at home? Yes  Cognitive Testing  Alert? Yes Normal Appearance?Yes  Oriented to person? Yes Place? Yes  Time? Yes  Recall of three objects? Yes  Can perform simple calculations? Yes  Displays appropriate judgment?Yes  Can read the correct time from a watch face?Yes   List the Names of Other Physician/Practitioners you currently use:   Dr. Merlene Laughter, Dr. Luan Pulling (Pulmonary), Dr. Oneida Alar (GI)   [facility]  [facility]  Screening Tests / Date Colonoscopy     UTD            Zostavax  Age 40  Mammogram  UTD  Influenza Vaccine  UTD  Tetanus/tdap UTD Pneumonia- UTD  PAP Smear-  UTD  Due 2020 Bone Density- UTD - In RECLAST   ROS: GEN- denies fatigue, fever, weight loss,weakness, recent illness HEENT- denies eye drainage, change in vision, nasal discharge, CVS- denies chest pain, palpitations RESP- denies SOB, cough, wheeze ABD- denies N/V, change in stools, abd pain GU- denies dysuria, hematuria, dribbling, incontinence MSK- denies joint pain, muscle aches, injury Neuro- denies headache, +dizziness, syncope, seizure  activity  Physical: GEN- NAD, alert and oriented x3 HEENT- PERRL, EOMI, non injected sclera, pink conjunctiva, MMM, oropharynx clear, TM clear bilat ,hearing grossly in tact  Neck- Supple, no thryomegaly, no bruit  CVS- RRR, no murmur RESP-scattered wheeze, no rhonchi ABD-NABS,soft,NT,ND  Neuro-CNII-XII in tact, no nystagmus, a little off balance with sudden movements EXT- No edema Pulses- Radial, DP- 2+    Assessment:    Annual wellness medicare exam   Plan:    During the course of the visit the patient was educated and counseled about appropriate screening and preventive services including:   Preventative screening UTD Screen Neg for depression.   For the dizziness, she is followed by neurology, does not appear to have seizure disorder based on history, no cardiac findigs thus far, may be more vasovagal/fatigue contributing to symptoms. She now has some verigo like symptoms/post concussive, continue vestibular Rehab per neurology, hold on valium, given meclizine. She is also NOT taking flexeril a this time.   Weight is also stable   Diet review for nutrition referral? Yes ____ Not Indicated __x__  Patient Instructions (the written plan) was given to the patient.  Medicare Attestation  I have personally reviewed:  The patient's medical and social history  Their use of alcohol, tobacco or illicit drugs  Their current medications and supplements  The patient's functional ability including ADLs,fall risks, home safety risks, cognitive, and hearing and visual impairment  Diet and physical activities  Evidence for depression or mood disorders  The patient's weight, height, BMI, and visual acuity have been recorded in the chart. I have made referrals, counseling, and provided education to the patient based on review of the above and I have provided the patient with a written personalized care plan for preventive services.

## 2016-06-24 NOTE — Assessment & Plan Note (Signed)
Restart symbicort, discussed how this is prophylatic medication

## 2016-06-25 ENCOUNTER — Ambulatory Visit (HOSPITAL_COMMUNITY): Payer: Medicare Other | Admitting: Physical Therapy

## 2016-06-25 ENCOUNTER — Telehealth (HOSPITAL_COMMUNITY): Payer: Self-pay | Admitting: Family Medicine

## 2016-06-25 NOTE — Telephone Encounter (Signed)
06/25/16 pt left a message that she needed to cx today but didn't really give a reason.... Just said that she wouldn't be coming in

## 2016-06-28 ENCOUNTER — Ambulatory Visit (HOSPITAL_COMMUNITY): Payer: Medicare Other

## 2016-06-28 DIAGNOSIS — R2681 Unsteadiness on feet: Secondary | ICD-10-CM | POA: Diagnosis not present

## 2016-06-28 DIAGNOSIS — R262 Difficulty in walking, not elsewhere classified: Secondary | ICD-10-CM | POA: Diagnosis not present

## 2016-06-28 NOTE — Therapy (Signed)
[facility] [place], [place], [address] Phone: [phone]   Fax:  [phone]  Physical Therapy Treatment  Patient Details  Name: [name] [name] MRN: [id number] Date of Birth: [birth year] Referring Provider: Andrey Spearman  Encounter Date: 06/28/2016      PT End of Session - 06/28/16 0958    Visit Number 3   Number of Visits 8   Date for PT Re-Evaluation 07/18/16   Authorization Type Medicare   Authorization - Visit Number 3   Authorization - Number of Visits 8   PT Start Time [phone]   PT Stop Time 1032   PT Time Calculation (min) 39 min   Activity Tolerance Patient tolerated treatment well   Behavior During Therapy [facility] for tasks assessed/performed      Past Medical History:  Diagnosis Date  . Allergic rhinitis   . Anastomotic ulcer JAN 2009  . Anxiety   . Asthma   . BMI (body mass index) 20.0-29.9 2009 121 lbs  . 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 Crohn disease ([place])   . Crohn disease ([place])   . CTS (carpal tunnel syndrome)   . Diarrhea MULITIFACTORIAL   IBS, LACTOSE INTOLERANCE, SBBO, BILE-SALT  . Elevated liver enzymes 2009: BMI 24 ?Etoh ALT 94, AST 51,  NEG ANA, qIGs& ASMA   MAY 2012 AST 52 ALT 38  . Esophageal stricture 2009  . GERD (gastroesophageal reflux disease)   . Hemorrhoid   . Hyperlipemia   . Hypertension   . Inflammatory bowel disease 2006 CD   SURGICAL REMISSION  . LBP (low back pain)   . Migraine   . PONV (postoperative nausea and vomiting)   . Shortness of breath    exertion and humidity  . Sleep apnea    Stop [name]    Past Surgical History:  Procedure Laterality Date  . BACK SURGERY    . BILATERAL SALPINGOOPHORECTOMY    . BIOPSY  12/24/2011   Procedure: BIOPSY;  Surgeon: Danie Binder, MD;  Location: [facility];  Service: Endoscopy;;  Gastric Biopsies  . BIOPSY N/A 06/30/2012   Procedure: BIOPSY;  Surgeon: Danie Binder, MD;  Location: [facility];  Service:  Endoscopy;  Laterality: N/A;  Gastric and Esophageal Biopsies  . CARPAL TUNNEL RELEASE  LEFT  . COLON SURGERY    . COLONOSCOPY  JAN 2009 DIARRHEA, ABD PAIN, BRBPR   ANASTOMOTIC ULCER(3MM), IH QJ:JHERDE ULCER, NL COLON bX  . DILATION AND CURETTAGE OF UTERUS    . ESOPHAGEAL DILATION  12/24/2011   SLF: A stricture was found in the distal esophagus/ Moderate gastritis/ MILD Duodenitis  . ESOPHAGOGASTRODUODENOSCOPY  02/07/09   mild gastritis  . ESOPHAGOGASTRODUODENOSCOPY (EGD) WITH PROPOFOL N/A 06/30/2012   SLF: 1. No definite stricture appreciated 2. small hiatal hernia 3. Gastritis  . ESOPHAGOGASTRODUODENOSCOPY (EGD) WITH PROPOFOL N/A 02/20/2016   Procedure: ESOPHAGOGASTRODUODENOSCOPY (EGD) WITH PROPOFOL;  Surgeon: Danie Binder, MD;  [facility];  Service: Endoscopy;  Laterality: N/A;  930  . gum flap Bilateral   . HEMICOLECTOMY  RIGHT 2006  . HERNIA REPAIR    . LUMBAR DISC SURGERY    . SAVORY DILATION N/A 06/30/2012   Procedure: SAVORY DILATION;  Surgeon: Danie Binder, MD;  Location: [facility];  Service: Endoscopy;  Laterality: N/A;  12.69m , 129m 1527m. SAVORY DILATION N/A 02/20/2016   Procedure: SAVORY DILATION;  Surgeon: SanDanie BinderD;  [facility]  [facility];  Service: Endoscopy;  Laterality: N/A;  . UPPER GASTROINTESTINAL ENDOSCOPY  JAN 2009 ABD PAIN   GASTRITIS, ESO RING  . UPPER GASTROINTESTINAL ENDOSCOPY  OCT 2010 ABD PAIN, DYSPHAGIA   DIL 15MM, GASTRITIS, NL DUODENUM  . UPPER GASTROINTESTINAL ENDOSCOPY  OCT 2010 DYSPHAGIA   DIL 17 MM, GASTRITIS, NL DUODENUM  . vocal cord surgery  Sep 20, 2010   precancerous areas removed  . wisdom tooth extraction Right    pin placement due to broken jaw    There were no vitals filed for this visit.      Subjective Assessment - 06/28/16 0957    Subjective Pt stated she is feeling good today, no reports of pain has taken pain medication.  Pt reports she continues to c/o dizziness.  Ambulating without AD.   Patient Stated  Goals to improve her balance, to be able to walk with her dog    Currently in Pain? No/denies                              Balance Exercises - 06/28/16 1012      Balance Exercises: Standing   Standing Eyes Opened Narrow base of support (BOS);Head turns;5 reps;Foam/compliant surface   Tandem Stance Eyes open;Foam/compliant surface;3 reps;30 secs  gaze   SLS Eyes open;4 reps   Tandem Gait Forward;Retro;2 reps   Sidestepping 2 reps;Theraband  RTB   Marching Limitations x10   Heel Raises Limitations x10    Sit to Stand Time x10 eccentric control             PT Short Term Goals - 06/21/16 1113      PT SHORT TERM GOAL #1   Title Pt to be able to stand without UE assist for five minutes to allow completion of self grooming with improved safety   Time 2   Period Weeks   Status On-going     PT SHORT TERM GOAL #2   Title PT to state that her dizziness frequency and intensity have decreased by 25%  to reduce risk of falling    Time 2   Period Weeks   Status On-going     PT SHORT TERM GOAL #3   Title Pt to be able to walk for 10 minutes without holding onto walls to reduce risk of falling.    Time 2   Period Weeks   Status On-going           PT Long Term Goals - 06/21/16 1114      PT LONG TERM GOAL #1   Title Pt to be able to stand without UE support for 15 minutes to be able to make a small meal without risk of falling    Time 4   Period Weeks   Status On-going     PT LONG TERM GOAL #2   Title PT to have resumed driving    Time 4   Period Weeks   Status On-going     PT LONG TERM GOAL #3   Title Pt to be able to single leg stance for 20 seconds B for reduce risk of falling    Time 4   Period Weeks   Status On-going               Plan - 06/28/16 1100    Clinical Impression Statement Session focus on progression with balance training incorporating gazing habituation to assist with dizziness  and balance.  Added dynamic surface  with balance training with min A and cueing to improve visual focus to assist with balance and dizziness.   Rehab Potential Good   PT Frequency 2x / week   PT Duration 4 weeks   PT Treatment/Interventions ADLs/Self Care Home Management;Canalith Repostioning;Gait training;Stair training;Functional mobility training;Therapeutic activities;Therapeutic exercise;Balance training;Neuromuscular re-education;Patient/family education;Manual techniques   PT Next Visit Plan Continue with habitation exercises and dynamic balance training.      Patient will benefit from skilled therapeutic intervention in order to improve the following deficits and impairments:  Abnormal gait, Decreased activity tolerance, Decreased balance, Difficulty walking  Visit Diagnosis: Unsteadiness on feet  Difficulty in walking, not elsewhere classified     Problem List Patient Active Problem List   Diagnosis Date Noted  . Loss of weight 07/04/2015  . Osteoarthritis 12/27/2014  . Allergic dermatitis eyelid 12/27/2014  . Impetigo 04/05/2014  . Ganglion cyst of wrist 04/05/2014  . Abnormal CT scan, chest 01/12/2014  . Seborrheic dermatitis of scalp 04/12/2013  . Infected insect bite of left breast 11/17/2012  . Polycythemia 09/08/2012  . Vertigo 01/22/2012  . Chronic pain 12/27/2011  . Dysphagia 03/14/2011  . DEPRESSION 04/24/2010  . Chronic bronchitis ([place]) 01/19/2010  . Dyspnea 08/04/2009  . Osteoporosis 11/11/2008  . BACK PAIN, LUMBAR, WITH RADICULOPATHY 07/28/2008  . TRANSAMINASES, SERUM, ELEVATED 03/18/2008  . GENITAL HERPES 09/29/2007  . 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 Hyperlipidemia 02/03/2007  . VARCIES, SITE [place] 09/30/2006  . ANXIETY 02/27/2006  . Essential hypertension 02/27/2006  . Allergic rhinitis 02/27/2006  . GERD 02/27/2006  . Crohn's disease of small intestine with complication [facility]) 37/35/7897   Ihor Austin, [place]; [place]  Aldona Lento 06/28/2016, 12:55 PM  [facility] [place], [place], [address] Phone: [phone]   Fax:  [phone]  Name: [name] [name] MRN: [id number] Date of Birth: 05/08/1955

## 2016-07-02 ENCOUNTER — Ambulatory Visit (HOSPITAL_COMMUNITY): Payer: Medicare Other | Admitting: Physical Therapy

## 2016-07-02 DIAGNOSIS — R2681 Unsteadiness on feet: Secondary | ICD-10-CM | POA: Diagnosis not present

## 2016-07-02 DIAGNOSIS — R262 Difficulty in walking, not elsewhere classified: Secondary | ICD-10-CM | POA: Diagnosis not present

## 2016-07-02 NOTE — Therapy (Signed)
[facility] [address] [place], [place], [address] Phone: [phone]   Fax:  [phone]  Physical Therapy Treatment  Patient Details  Name: [name] [name] MRN: [id number] Date of Birth: 1955/09/06 Referring Provider: Andrey Spearman  Encounter Date: 07/02/2016      [name] End of Session - 07/02/16 1016    Visit Number 4   Number of Visits 8   Date for [name] Re-Evaluation 07/18/16   Authorization Type Medicare   Authorization - Visit Number 4   Authorization - Number of Visits 8   [name] Start Time 0950   [name] Stop Time 1030   [name] Time Calculation (min) 40 min   Equipment Utilized During Treatment Gait belt   Activity Tolerance Patient tolerated treatment well   Behavior During Therapy [facility] for tasks assessed/performed      Past Medical History:  Diagnosis Date  . Allergic rhinitis   . Anastomotic ulcer JAN 2009  . Anxiety   . Asthma   . BMI (body mass index) 20.0-29.9 2009 121 lbs  . 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 Crohn disease ([place])   . Crohn disease ([place])   . CTS (carpal tunnel syndrome)   . Diarrhea MULITIFACTORIAL   IBS, LACTOSE INTOLERANCE, SBBO, BILE-SALT  . Elevated liver enzymes 2009: BMI 24 ?Etoh ALT 94, AST 51,  NEG ANA, qIGs& ASMA   MAY 2012 AST 52 ALT 38  . Esophageal stricture 2009  . GERD (gastroesophageal reflux disease)   . Hemorrhoid   . Hyperlipemia   . Hypertension   . Inflammatory bowel disease 2006 CD   SURGICAL REMISSION  . LBP (low back pain)   . Migraine   . PONV (postoperative nausea and vomiting)   . Shortness of breath    exertion and humidity  . Sleep apnea    Stop [name]    Past Surgical History:  Procedure Laterality Date  . BACK SURGERY    . BILATERAL SALPINGOOPHORECTOMY    . BIOPSY  12/24/2011   Procedure: BIOPSY;  Surgeon: Danie Binder, MD;  Location[facility];  Service: Endoscopy;;  Gastric Biopsies  . BIOPSY N/A 06/30/2012   Procedure: BIOPSY;  Surgeon: Danie Binder, MD;  Location: [facility];  Service: Endoscopy;  Laterality: N/A;  Gastric and Esophageal Biopsies  . CARPAL TUNNEL RELEASE  LEFT  . COLON SURGERY    . COLONOSCOPY  JAN 2009 DIARRHEA, ABD PAIN, BRBPR   ANASTOMOTIC ULCER(3MM), IH SN:[id number] ULCER, NL COLON bX  . DILATION AND CURETTAGE OF UTERUS    . ESOPHAGEAL DILATION  12/24/2011   SLF: A stricture was found in the distal esophagus/ Moderate gastritis/ MILD Duodenitis  . ESOPHAGOGASTRODUODENOSCOPY  02/07/09   mild gastritis  . ESOPHAGOGASTRODUODENOSCOPY (EGD) WITH PROPOFOL N/A 06/30/2012   SLF: 1. No definite stricture appreciated 2. small hiatal hernia 3. Gastritis  . ESOPHAGOGASTRODUODENOSCOPY (EGD) WITH PROPOFOL N/A 02/20/2016   Procedure: ESOPHAGOGASTRODUODENOSCOPY (EGD) WITH PROPOFOL;  Surgeon: Danie Binder, MD;  [facility];  Service: Endoscopy;  Laterality: N/A;  930  . gum flap Bilateral   . HEMICOLECTOMY  RIGHT 2006  . HERNIA REPAIR    . LUMBAR DISC SURGERY    . SAVORY DILATION N/A 06/30/2012   Procedure: SAVORY DILATION;  Surgeon: Danie Binder, MD;  Location[facility];  Service: Endoscopy;  Laterality: N/A;  12.76m , 11m 1579m. SAVORY DILATION N/A 02/20/2016   Procedure: SAVORY DILATION;  Surgeon: Danie Binder, MD;  [facility];  Service: Endoscopy;  Laterality: N/A;  . UPPER GASTROINTESTINAL ENDOSCOPY  JAN 2009 ABD PAIN   GASTRITIS, ESO RING  . UPPER GASTROINTESTINAL ENDOSCOPY  OCT 2010 ABD PAIN, DYSPHAGIA   DIL 15MM, GASTRITIS, NL DUODENUM  . UPPER GASTROINTESTINAL ENDOSCOPY  OCT 2010 DYSPHAGIA   DIL 17 MM, GASTRITIS, NL DUODENUM  . vocal cord surgery  Sep 20, 2010   precancerous areas removed  . wisdom tooth extraction Right    pin placement due to broken jaw    There were no vitals filed for this visit.      Subjective Assessment - 07/02/16 0945    Subjective [name] states that she has been doing her exercises at home and is doing better but still has dizzziness    Limitations  Standing;Walking;House hold activities   How long can you sit comfortably? no problem    How long can you stand comfortably? able to stand for 15 seconds without touching something    How long can you walk comfortably? Able to walk for less than five minutes    Patient Stated Goals to improve her balance, to be able to walk with her dog                               Balance Exercises - 07/02/16 1004      Balance Exercises: Standing   Standing Eyes Opened Narrow base of support (BOS);Head turns;Other reps (comment)  10 head turns in pediatric on a busy background; stand foam    Tandem Stance Eyes open;2 reps  head nods in front of a busy background    SLS Eyes open;4 reps   Tandem Gait Forward;Retro;2 reps  on balance beam in pediatric room    Sidestepping 2 reps;Theraband   Marching Limitations x10   Heel Raises Limitations x10    Sit to Stand Time x10 eccentric control             [name] Short Term Goals - 06/21/16 1113      [name] SHORT TERM GOAL #1   Title [name] to be able to stand without UE assist for five minutes to allow completion of self grooming with improved safety   Time 2   Period Weeks   Status On-going     [name] SHORT TERM GOAL #2   Title [name] to state that her dizziness frequency and intensity have decreased by 25%  to reduce risk of falling    Time 2   Period Weeks   Status On-going     [name] SHORT TERM GOAL #3   Title [name] to be able to walk for 10 minutes without holding onto walls to reduce risk of falling.    Time 2   Period Weeks   Status On-going           [name] Long Term Goals - 06/21/16 1114      [name] LONG TERM GOAL #1   Title [name] to be able to stand without UE support for 15 minutes to be able to make a small meal without risk of falling    Time 4   Period Weeks   Status On-going     [name] LONG TERM GOAL #2   Title [name] to have resumed driving    Time 4   Period Weeks   Status On-going     [name] LONG TERM GOAL #3  Title [name] to be able  to single leg stance for 20 seconds B for reduce risk of falling    Time 4   Period Weeks   Status On-going               Plan - 07/02/16 1018    Clinical Impression Statement Added head turns in front of busy background to increase difficulty of  task;  balance beam and retro on balance beam with mod assist for safety.     Rehab Potential Good   [name] Frequency 2x / week   [name] Duration 4 weeks   [name] Treatment/Interventions ADLs/Self Care Home Management;Canalith Repostioning;Gait training;Stair training;Functional mobility training;Therapeutic activities;Therapeutic exercise;Balance training;Neuromuscular re-education;Patient/family education;Manual techniques   [name] Next Visit Plan Continue with habitation exercises and dynamic balance training.      Patient will benefit from skilled therapeutic intervention in order to improve the following deficits and impairments:  Abnormal gait, Decreased activity tolerance, Decreased balance, Difficulty walking  Visit Diagnosis: Unsteadiness on feet  Difficulty in walking, not elsewhere classified     Problem List Patient Active Problem List   Diagnosis Date Noted  . Loss of weight 07/04/2015  . Osteoarthritis 12/27/2014  . Allergic dermatitis eyelid 12/27/2014  . Impetigo 04/05/2014  . Ganglion cyst of wrist 04/05/2014  . Abnormal CT scan, chest 01/12/2014  . Seborrheic dermatitis of scalp 04/12/2013  . Infected insect bite of left breast 11/17/2012  . Polycythemia 09/08/2012  . Vertigo 01/22/2012  . Chronic pain 12/27/2011  . Dysphagia 03/14/2011  . DEPRESSION 04/24/2010  . Chronic bronchitis ([place]) 01/19/2010  . Dyspnea 08/04/2009  . Osteoporosis 11/11/2008  . BACK PAIN, LUMBAR, WITH RADICULOPATHY 07/28/2008  . TRANSAMINASES, SERUM, ELEVATED 03/18/2008  . GENITAL HERPES 09/29/2007  . 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 Hyperlipidemia 02/03/2007  . VARCIES, SITE [place] 09/30/2006  . ANXIETY 02/27/2006  .  Essential hypertension 02/27/2006  . Allergic rhinitis 02/27/2006  . GERD 02/27/2006  . Crohn's disease of small intestine with complication [facility]) 96/08/5407    [name] [name], [name] [name] [id number] 07/02/2016, 10:32 AM  [facility] [address] [place], [place], [address] Phone: [phone]   Fax:  [phone]  Name: [name] [name] MRN: [id number] Date of Birth: [birth year]

## 2016-07-04 ENCOUNTER — Ambulatory Visit (HOSPITAL_COMMUNITY): Payer: Medicare Other | Attending: Diagnostic Neuroimaging | Admitting: Physical Therapy

## 2016-07-04 DIAGNOSIS — R262 Difficulty in walking, not elsewhere classified: Secondary | ICD-10-CM | POA: Diagnosis not present

## 2016-07-04 DIAGNOSIS — R2681 Unsteadiness on feet: Secondary | ICD-10-CM | POA: Diagnosis not present

## 2016-07-04 NOTE — Therapy (Signed)
[place], [place], [address] Phone: [phone]   Fax:  [phone]  Physical Therapy Treatment  Patient Details  Name: [name] [name] MRN: [id number] Date of Birth: 08-28-1955 Referring Provider: Andrey Spearman  Encounter Date: 07/04/2016      PT End of Session - 07/04/16 1417    Visit Number 5   Number of Visits 8   Date for PT Re-Evaluation 07/18/16   Authorization Type Medicare   Authorization - Visit Number 5   Authorization - Number of Visits 8   PT Start Time 8485   PT Stop Time 1108   PT Time Calculation (min) 36 min   Equipment Utilized During Treatment Gait belt   Activity Tolerance Patient tolerated treatment well   Behavior During Therapy [facility] for tasks assessed/performed      Past Medical History:  Diagnosis Date  . Allergic rhinitis   . Anastomotic ulcer JAN 2009  . Anxiety   . Asthma   . BMI (body mass index) 20.0-29.9 2009 121 lbs  . 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 Crohn disease ([place])   . Crohn disease ([place])   . CTS (carpal tunnel syndrome)   . Diarrhea MULITIFACTORIAL   IBS, LACTOSE INTOLERANCE, SBBO, BILE-SALT  . Elevated liver enzymes 2009: BMI 24 ?Etoh ALT 94, AST 51,  NEG ANA, qIGs& ASMA   MAY 2012 AST 52 ALT 38  . Esophageal stricture 2009  . GERD (gastroesophageal reflux disease)   . Hemorrhoid   . Hyperlipemia   . Hypertension   . Inflammatory bowel disease 2006 CD   SURGICAL REMISSION  . LBP (low back pain)   . Migraine   . PONV (postoperative nausea and vomiting)   . Shortness of breath    exertion and humidity  . Sleep apnea    Stop [name]    Past Surgical History:  Procedure Laterality Date  . BACK SURGERY    . BILATERAL SALPINGOOPHORECTOMY    . BIOPSY  12/24/2011   Procedure: BIOPSY;  Surgeon: Danie Binder, MD;  Location: [facility];  Service: Endoscopy;;  Gastric Biopsies  . BIOPSY N/A 06/30/2012   Procedure: BIOPSY;  Surgeon: Danie Binder, MD;  Location: [facility];  Service: Endoscopy;  Laterality: N/A;  Gastric and Esophageal Biopsies  . CARPAL TUNNEL RELEASE  LEFT  . COLON SURGERY    . COLONOSCOPY  JAN 2009 DIARRHEA, ABD PAIN, BRBPR   ANASTOMOTIC ULCER(3MM), IH TC:NGFREV ULCER, NL COLON bX  . DILATION AND CURETTAGE OF UTERUS    . ESOPHAGEAL DILATION  12/24/2011   SLF: A stricture was found in the distal esophagus/ Moderate gastritis/ MILD Duodenitis  . ESOPHAGOGASTRODUODENOSCOPY  02/07/09   mild gastritis  . ESOPHAGOGASTRODUODENOSCOPY (EGD) WITH PROPOFOL N/A 06/30/2012   SLF: 1. No definite stricture appreciated 2. small hiatal hernia 3. Gastritis  . ESOPHAGOGASTRODUODENOSCOPY (EGD) WITH PROPOFOL N/A 02/20/2016   Procedure: ESOPHAGOGASTRODUODENOSCOPY (EGD) WITH PROPOFOL;  Surgeon: Danie Binder, MD;  [facility];  Service: Endoscopy;  Laterality: N/A;  930  . gum flap Bilateral   . HEMICOLECTOMY  RIGHT 2006  . HERNIA REPAIR    . LUMBAR DISC SURGERY    . SAVORY DILATION N/A 06/30/2012   Procedure: SAVORY DILATION;  Surgeon: Danie Binder, MD;  Location: [facility];  Service: Endoscopy;  Laterality: N/A;  12.90m , 18m 1561m. SAVORY DILATION N/A 02/20/2016   Procedure: SAVORY DILATION;  Surgeon: Danie Binder, MD;  Location[facility];  Service: Endoscopy;  Laterality: N/A;  . UPPER GASTROINTESTINAL ENDOSCOPY  JAN 2009 ABD PAIN   GASTRITIS, ESO RING  . UPPER GASTROINTESTINAL ENDOSCOPY  OCT 2010 ABD PAIN, DYSPHAGIA   DIL 15MM, GASTRITIS, NL DUODENUM  . UPPER GASTROINTESTINAL ENDOSCOPY  OCT 2010 DYSPHAGIA   DIL 17 MM, GASTRITIS, NL DUODENUM  . vocal cord surgery  Sep 20, 2010   precancerous areas removed  . wisdom tooth extraction Right    pin placement due to broken jaw    There were no vitals filed for this visit.      Subjective Assessment - 07/04/16 1428    Subjective Pt states she is okay today.  Reorts she is still having some dizziness.    Currently in Pain? No/denies                               Balance Exercises - 07/04/16 1426      Balance Exercises: Standing   Standing Eyes Opened Narrow base of support (BOS);Head turns;Other reps (comment)   Tandem Stance Eyes open;2 reps   SLS Eyes open;4 reps  Rt: 30", Lt: 7"   Tandem Gait Forward;Retro;2 reps   Sidestepping 2 reps;Theraband   Marching Limitations 15   Heel Raises Limitations 15   Sit to Stand Time x10 eccentric control             PT Short Term Goals - 06/21/16 1113      PT SHORT TERM GOAL #1   Title Pt to be able to stand without UE assist for five minutes to allow completion of self grooming with improved safety   Time 2   Period Weeks   Status On-going     PT SHORT TERM GOAL #2   Title PT to state that her dizziness frequency and intensity have decreased by 25%  to reduce risk of falling    Time 2   Period Weeks   Status On-going     PT SHORT TERM GOAL #3   Title Pt to be able to walk for 10 minutes without holding onto walls to reduce risk of falling.    Time 2   Period Weeks   Status On-going           PT Long Term Goals - 06/21/16 1114      PT LONG TERM GOAL #1   Title Pt to be able to stand without UE support for 15 minutes to be able to make a small meal without risk of falling    Time 4   Period Weeks   Status On-going     PT LONG TERM GOAL #2   Title PT to have resumed driving    Time 4   Period Weeks   Status On-going     PT LONG TERM GOAL #3   Title Pt to be able to single leg stance for 20 seconds B for reduce risk of falling    Time 4   Period Weeks   Status On-going               Plan - 07/04/16 1417    Clinical Impression Statement Continued with focus on improving balance and stabilitiy.  Pt requested to leave early towards end of session as she had to go check on her daughter.  Unable to add additional therex or complete last activity.  Pt  with minimal LOB completing most activities with mod-max needed for  balance beam and tandem stance/head turn challenges.  Able to complete SLS on Rt LE for 30" and only 7" on Lt today without UE assist.    Rehab Potential Good   PT Frequency 2x / week   PT Duration 4 weeks   PT Treatment/Interventions ADLs/Self Care Home Management;Canalith Repostioning;Gait training;Stair training;Functional mobility training;Therapeutic activities;Therapeutic exercise;Balance training;Neuromuscular re-education;Patient/family education;Manual techniques   PT Next Visit Plan Continue with habitation exercises and dynamic balance training.      Patient will benefit from skilled therapeutic intervention in order to improve the following deficits and impairments:  Abnormal gait, Decreased activity tolerance, Decreased balance, Difficulty walking  Visit Diagnosis: Unsteadiness on feet  Difficulty in walking, not elsewhere classified     Problem List Patient Active Problem List   Diagnosis Date Noted  . Loss of weight 07/04/2015  . Osteoarthritis 12/27/2014  . Allergic dermatitis eyelid 12/27/2014  . Impetigo 04/05/2014  . Ganglion cyst of wrist 04/05/2014  . Abnormal CT scan, chest 01/12/2014  . Seborrheic dermatitis of scalp 04/12/2013  . Infected insect bite of left breast 11/17/2012  . Polycythemia 09/08/2012  . Vertigo 01/22/2012  . Chronic pain 12/27/2011  . Dysphagia 03/14/2011  . DEPRESSION 04/24/2010  . Chronic bronchitis ([place]) 01/19/2010  . Dyspnea 08/04/2009  . Osteoporosis 11/11/2008  . BACK PAIN, LUMBAR, WITH RADICULOPATHY 07/28/2008  . TRANSAMINASES, SERUM, ELEVATED 03/18/2008  . GENITAL HERPES 09/29/2007  . 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 Hyperlipidemia 02/03/2007  . VARCIES, SITE [place] 09/30/2006  . ANXIETY 02/27/2006  . Essential hypertension 02/27/2006  . Allergic rhinitis 02/27/2006  . GERD 02/27/2006  . Crohn's disease of small intestine with complication [facility]) 24/49/7530    Teena Irani,  PTA/CLT [phone]  07/04/2016, 2:29 PM  [facility] [address] [place], [place], [address] Phone: [phone]   Fax:  [phone]  Name: DESIA SABAN MRN: [id number] Date of Birth: 10-31-1955

## 2016-07-08 ENCOUNTER — Ambulatory Visit (HOSPITAL_COMMUNITY): Payer: Medicare Other | Admitting: Physical Therapy

## 2016-07-08 DIAGNOSIS — R262 Difficulty in walking, not elsewhere classified: Secondary | ICD-10-CM | POA: Diagnosis not present

## 2016-07-08 DIAGNOSIS — R2681 Unsteadiness on feet: Secondary | ICD-10-CM

## 2016-07-08 NOTE — Therapy (Signed)
[place], [place], [address] Phone: [phone]   Fax:  [phone]  Physical Therapy Treatment  Patient Details  Name: [name] [name] MRN: [id number] Date of Birth: Apr 14, 1956 Referring Provider: Andrey Spearman  Encounter Date: 07/08/2016      PT End of Session - 07/08/16 1120    Visit Number 6   Number of Visits 8   Date for PT Re-Evaluation 07/18/16   Authorization Type Medicare   Authorization - Visit Number 6   Authorization - Number of Visits 8   Equipment Utilized During Treatment Gait belt   Activity Tolerance Patient tolerated treatment well   Behavior During Therapy [facility] for tasks assessed/performed      Past Medical History:  Diagnosis Date  . Allergic rhinitis   . Anastomotic ulcer JAN 2009  . Anxiety   . Asthma   . BMI (body mass index) 20.0-29.9 2009 121 lbs  . 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 Crohn disease ([place])   . Crohn disease ([place])   . CTS (carpal tunnel syndrome)   . Diarrhea MULITIFACTORIAL   IBS, LACTOSE INTOLERANCE, SBBO, BILE-SALT  . Elevated liver enzymes 2009: BMI 24 ?Etoh ALT 94, AST 51,  NEG ANA, qIGs& ASMA   MAY 2012 AST 52 ALT 38  . Esophageal stricture 2009  . GERD (gastroesophageal reflux disease)   . Hemorrhoid   . Hyperlipemia   . Hypertension   . Inflammatory bowel disease 2006 CD   SURGICAL REMISSION  . LBP (low back pain)   . Migraine   . PONV (postoperative nausea and vomiting)   . Shortness of breath    exertion and humidity  . Sleep apnea    Stop [name]    Past Surgical History:  Procedure Laterality Date  . BACK SURGERY    . BILATERAL SALPINGOOPHORECTOMY    . BIOPSY  12/24/2011   Procedure: BIOPSY;  Surgeon: Danie Binder, MD;  [facility];  Service: Endoscopy;;  Gastric Biopsies  . BIOPSY N/A 06/30/2012   Procedure: BIOPSY;  Surgeon: Danie Binder, MD;  Location: [facility];  Service: Endoscopy;  Laterality: N/A;  Gastric  and Esophageal Biopsies  . CARPAL TUNNEL RELEASE  LEFT  . COLON SURGERY    . COLONOSCOPY  JAN 2009 DIARRHEA, ABD PAIN, BRBPR   ANASTOMOTIC ULCER(3MM), IH XF:GHWEXH ULCER, NL COLON bX  . DILATION AND CURETTAGE OF UTERUS    . ESOPHAGEAL DILATION  12/24/2011   SLF: A stricture was found in the distal esophagus/ Moderate gastritis/ MILD Duodenitis  . ESOPHAGOGASTRODUODENOSCOPY  02/07/09   mild gastritis  . ESOPHAGOGASTRODUODENOSCOPY (EGD) WITH PROPOFOL N/A 06/30/2012   SLF: 1. No definite stricture appreciated 2. small hiatal hernia 3. Gastritis  . ESOPHAGOGASTRODUODENOSCOPY (EGD) WITH PROPOFOL N/A 02/20/2016   Procedure: ESOPHAGOGASTRODUODENOSCOPY (EGD) WITH PROPOFOL;  Surgeon: Danie Binder, MD;  [facility];  Service: Endoscopy;  Laterality: N/A;  930  . gum flap Bilateral   . HEMICOLECTOMY  RIGHT 2006  . HERNIA REPAIR    . LUMBAR DISC SURGERY    . SAVORY DILATION N/A 06/30/2012   Procedure: SAVORY DILATION;  Surgeon: Danie Binder, MD;  Location: [facility];  Service: Endoscopy;  Laterality: N/A;  12.35m , 153m 1567m. SAVORY DILATION N/A 02/20/2016   Procedure: SAVORY DILATION;  Surgeon: SanDanie BinderD;  [facility];  Service: Endoscopy;  Laterality: N/A;  . UPPER  GASTROINTESTINAL ENDOSCOPY  JAN 2009 ABD PAIN   GASTRITIS, ESO RING  . UPPER GASTROINTESTINAL ENDOSCOPY  OCT 2010 ABD PAIN, DYSPHAGIA   DIL 15MM, GASTRITIS, NL DUODENUM  . UPPER GASTROINTESTINAL ENDOSCOPY  OCT 2010 DYSPHAGIA   DIL 17 MM, GASTRITIS, NL DUODENUM  . vocal cord surgery  Sep 20, 2010   precancerous areas removed  . wisdom tooth extraction Right    pin placement due to broken jaw    There were no vitals filed for this visit.      Subjective Assessment - 07/08/16 0958    Subjective Pt states she is having dizziness but is tolerable.  States she feels she's getting stronger overall.   Currently in Pain? No/denies                              Balance Exercises  - 07/08/16 0959      Balance Exercises: Standing   Standing Eyes Opened Narrow base of support (BOS);Head turns;Other reps (comment)   Tandem Stance Eyes open;2 reps   SLS Eyes open;4 reps   Tandem Gait Forward;Retro;2 reps   Sidestepping 2 reps;Theraband   Cone Rotation Foam/compliant surface;Right turn;Left turn;R/L   Cone Rotation Limitations 8 cones high and low surfaces reaching outside BOS   Sit to Stand Time x10 eccentric control             PT Short Term Goals - 06/21/16 1113      PT SHORT TERM GOAL #1   Title Pt to be able to stand without UE assist for five minutes to allow completion of self grooming with improved safety   Time 2   Period Weeks   Status On-going     PT SHORT TERM GOAL #2   Title PT to state that her dizziness frequency and intensity have decreased by 25%  to reduce risk of falling    Time 2   Period Weeks   Status On-going     PT SHORT TERM GOAL #3   Title Pt to be able to walk for 10 minutes without holding onto walls to reduce risk of falling.    Time 2   Period Weeks   Status On-going           PT Long Term Goals - 06/21/16 1114      PT LONG TERM GOAL #1   Title Pt to be able to stand without UE support for 15 minutes to be able to make a small meal without risk of falling    Time 4   Period Weeks   Status On-going     PT LONG TERM GOAL #2   Title PT to have resumed driving    Time 4   Period Weeks   Status On-going     PT LONG TERM GOAL #3   Title Pt to be able to single leg stance for 20 seconds B for reduce risk of falling    Time 4   Period Weeks   Status On-going               Plan - 07/08/16 1121    Clinical Impression Statement Focus remains on stability and balance activities.  Added cone rotations this session with noted challenge and confidence reaching outside BOS.  Balance beam remains most challenging for patient.  Pt requires encourageement to complete entire session requesting to leaving early.  No  LOB this session while completing head  turns on foam.    Rehab Potential Good   PT Frequency 2x / week   PT Duration 4 weeks   PT Treatment/Interventions ADLs/Self Care Home Management;Canalith Repostioning;Gait training;Stair training;Functional mobility training;Therapeutic activities;Therapeutic exercise;Balance training;Neuromuscular re-education;Patient/family education;Manual techniques   PT Next Visit Plan Continue with habitation exercises and dynamic balance training. Re-evaluate in 2 more visits.        Patient will benefit from skilled therapeutic intervention in order to improve the following deficits and impairments:  Abnormal gait, Decreased activity tolerance, Decreased balance, Difficulty walking  Visit Diagnosis: Unsteadiness on feet  Difficulty in walking, not elsewhere classified     Problem List Patient Active Problem List   Diagnosis Date Noted  . Loss of weight 07/04/2015  . Osteoarthritis 12/27/2014  . Allergic dermatitis eyelid 12/27/2014  . Impetigo 04/05/2014  . Ganglion cyst of wrist 04/05/2014  . Abnormal CT scan, chest 01/12/2014  . Seborrheic dermatitis of scalp 04/12/2013  . Infected insect bite of left breast 11/17/2012  . Polycythemia 09/08/2012  . Vertigo 01/22/2012  . Chronic pain 12/27/2011  . Dysphagia 03/14/2011  . DEPRESSION 04/24/2010  . Chronic bronchitis ([place]) 01/19/2010  . Dyspnea 08/04/2009  . Osteoporosis 11/11/2008  . BACK PAIN, LUMBAR, WITH RADICULOPATHY 07/28/2008  . TRANSAMINASES, SERUM, ELEVATED 03/18/2008  . GENITAL HERPES 09/29/2007  . 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 Hyperlipidemia 02/03/2007  . VARCIES, SITE [place] 09/30/2006  . ANXIETY 02/27/2006  . Essential hypertension 02/27/2006  . Allergic rhinitis 02/27/2006  . GERD 02/27/2006  . Crohn's disease of small intestine with complication [facility]) 64/33/2951    Teena Irani, PTA/CLT [phone]  07/08/2016, 11:25 AM  [place], [place], [address] Phone: [phone]   Fax:  [phone]  Name: [name] [name] MRN: [id number] Date of Birth: 1955-06-23

## 2016-07-11 ENCOUNTER — Ambulatory Visit (HOSPITAL_COMMUNITY): Payer: Medicare Other | Admitting: Physical Therapy

## 2016-07-11 DIAGNOSIS — R262 Difficulty in walking, not elsewhere classified: Secondary | ICD-10-CM | POA: Diagnosis not present

## 2016-07-11 DIAGNOSIS — R2681 Unsteadiness on feet: Secondary | ICD-10-CM | POA: Diagnosis not present

## 2016-07-11 NOTE — Therapy (Signed)
[name] [name] [address] [place], [place], [address] Phone: [phone]   Fax:  [phone]  Physical Therapy Treatment  Patient Details  Name: [name] [name] MRN: [id number] Date of Birth: 1956/04/07 Referring Provider: Andrey [name]  Encounter Date: 07/11/2016      PT End of Session - 07/11/16 1331    Visit Number 6   Number of Visits 8   Date for PT Re-Evaluation 07/18/16   Authorization Type Medicare   Authorization - Visit Number 6   Authorization - Number of Visits 8   PT Start Time 1324   PT Stop Time 1115   PT Time Calculation (min) 45 min   Equipment Utilized During Treatment Gait belt   Activity Tolerance Patient tolerated treatment well   Behavior During Therapy [facility] for tasks assessed/performed      Past Medical History:  Diagnosis Date  . Allergic rhinitis   . Anastomotic ulcer JAN 2009  . Anxiety   . Asthma   . BMI (body mass index) 20.0-29.9 2009 121 lbs  . 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 Crohn disease ([place])   . Crohn disease ([place])   . CTS (carpal tunnel syndrome)   . Diarrhea MULITIFACTORIAL   IBS, LACTOSE INTOLERANCE, SBBO, BILE-SALT  . Elevated liver enzymes 2009: BMI 24 ?Etoh ALT 94, AST 51,  NEG ANA, qIGs& ASMA   MAY 2012 AST 52 ALT 38  . Esophageal stricture 2009  . GERD (gastroesophageal reflux disease)   . Hemorrhoid   . Hyperlipemia   . Hypertension   . Inflammatory bowel disease 2006 CD   SURGICAL REMISSION  . LBP (low back pain)   . Migraine   . PONV (postoperative nausea and vomiting)   . Shortness of breath    exertion and humidity  . Sleep apnea    Stop [name] [name] score of 4    Past Surgical History:  Procedure Laterality Date  . BACK SURGERY    . BILATERAL SALPINGOOPHORECTOMY    . BIOPSY  12/24/2011   Procedure: BIOPSY;  Surgeon: [name] Binder, MD;  Location: [facility];  Service: Endoscopy;;  Gastric Biopsies  . BIOPSY N/A 06/30/2012   Procedure: BIOPSY;  Surgeon: [name] Binder, MD;  Location: [facility];  Service: Endoscopy;  Laterality: N/A;  Gastric and Esophageal Biopsies  . CARPAL TUNNEL RELEASE  LEFT  . COLON SURGERY    . COLONOSCOPY  JAN 2009 DIARRHEA, ABD PAIN, BRBPR   ANASTOMOTIC ULCER(3MM), IH MW:NUUVOZ ULCER, NL COLON bX  . DILATION AND CURETTAGE OF UTERUS    . ESOPHAGEAL DILATION  12/24/2011   SLF: A stricture was found in the distal esophagus/ Moderate gastritis/ MILD Duodenitis  . ESOPHAGOGASTRODUODENOSCOPY  02/07/09   mild gastritis  . ESOPHAGOGASTRODUODENOSCOPY (EGD) WITH PROPOFOL N/A 06/30/2012   SLF: 1. No definite stricture appreciated 2. small hiatal hernia 3. Gastritis  . ESOPHAGOGASTRODUODENOSCOPY (EGD) WITH PROPOFOL N/A 02/20/2016   Procedure: ESOPHAGOGASTRODUODENOSCOPY (EGD) WITH PROPOFOL;  Surgeon: [name] Binder, MD;  [facility];  Service: Endoscopy;  Laterality: N/A;  930  . gum flap Bilateral   . HEMICOLECTOMY  RIGHT 2006  . HERNIA REPAIR    . LUMBAR DISC SURGERY    . SAVORY DILATION N/A 06/30/2012   Procedure: SAVORY DILATION;  Surgeon: [name] Binder, MD;  Location: [facility];  Service: Endoscopy;  Laterality: N/A;  12.72m , 135m 1512m. SAVORY DILATION N/A 02/20/2016   Procedure: SAVORY DILATION;  Surgeon: [name] Binder, MD;  [facility];  Service: Endoscopy;  Laterality: N/A;  . UPPER GASTROINTESTINAL ENDOSCOPY  JAN 2009 ABD PAIN   GASTRITIS, ESO RING  . UPPER GASTROINTESTINAL ENDOSCOPY  OCT 2010 ABD PAIN, DYSPHAGIA   DIL 15MM, GASTRITIS, NL DUODENUM  . UPPER GASTROINTESTINAL ENDOSCOPY  OCT 2010 DYSPHAGIA   DIL 17 MM, GASTRITIS, NL DUODENUM  . vocal cord surgery  Sep 20, 2010   precancerous areas removed  . wisdom tooth extraction Right    pin placement due to broken jaw    There were no vitals filed for this visit.      Subjective Assessment - 07/11/16 1032    Subjective Pt states she went to a funeral yesterday and her husband had to help her. States it lasted 3 hours.   Pt reports feeling dizzy  today but having no pain.   Currently in Pain? No/denies                              Balance Exercises - 07/11/16 1036      Balance Exercises: Standing   Standing Eyes Opened Narrow base of support (BOS);Head turns;Other reps (comment);Foam/compliant surface   Balance Beam tandem   Gait with Head Turns 2 reps  up/down rt/lt   Partial Tandem Stance 2 reps   Sidestepping 1 rep  green tband under feet (much harder per pateint)   Heel Raises Limitations 15  no UE   Toe Raise Limitations 15   Sit to Stand Time x10 eccentric control   Other Standing Exercises heel walk, toe walk 2Rt each, standing on dynadisc 1 minute 10sec max balance without UE assist.             PT Short Term Goals - 06/21/16 1113      PT SHORT TERM GOAL #1   Title Pt to be able to stand without UE assist for five minutes to allow completion of self grooming with improved safety   Time 2   Period Weeks   Status On-going     PT SHORT TERM GOAL #2   Title PT to state that her dizziness frequency and intensity have decreased by 25%  to reduce risk of falling    Time 2   Period Weeks   Status On-going     PT SHORT TERM GOAL #3   Title Pt to be able to walk for 10 minutes without holding onto walls to reduce risk of falling.    Time 2   Period Weeks   Status On-going           PT Long Term Goals - 06/21/16 1114      PT LONG TERM GOAL #1   Title Pt to be able to stand without UE support for 15 minutes to be able to make a small meal without risk of falling    Time 4   Period Weeks   Status On-going     PT LONG TERM GOAL #2   Title PT to have resumed driving    Time 4   Period Weeks   Status On-going     PT LONG TERM GOAL #3   Title Pt to be able to single leg stance for 20 seconds B for reduce risk of falling    Time 4   Period Weeks   Status On-going  Plan - 07/11/16 1331    Clinical Impression Statement Able to complete full sesssion  today.  Progressed to dynadisc standing balance with NBOS and intermittent UE assist.  PT with improved stability completing balance beam this session without LOB or UE assist.  Added heel and toe walk with noted difficulty.  Pt with overall improvements.     Rehab Potential Good   PT Frequency 2x / week   PT Duration 4 weeks   PT Treatment/Interventions ADLs/Self Care Home Management;Canalith Repostioning;Gait training;Stair training;Functional mobility training;Therapeutic activities;Therapeutic exercise;Balance training;Neuromuscular re-education;Patient/family education;Manual techniques   PT Next Visit Plan Continue with habitation exercises and dynamic balance training. Re-evaluate next session      Patient will benefit from skilled therapeutic intervention in order to improve the following deficits and impairments:  Abnormal gait, Decreased activity tolerance, Decreased balance, Difficulty walking  Visit Diagnosis: Unsteadiness on feet  Difficulty in walking, not elsewhere classified     Problem List Patient Active Problem List   Diagnosis Date Noted  . Loss of weight 07/04/2015  . Osteoarthritis 12/27/2014  . Allergic dermatitis eyelid 12/27/2014  . Impetigo 04/05/2014  . Ganglion cyst of wrist 04/05/2014  . Abnormal CT scan, chest 01/12/2014  . Seborrheic dermatitis of scalp 04/12/2013  . Infected insect bite of left breast 11/17/2012  . Polycythemia 09/08/2012  . Vertigo 01/22/2012  . Chronic pain 12/27/2011  . Dysphagia 03/14/2011  . DEPRESSION 04/24/2010  . Chronic bronchitis ([place]) 01/19/2010  . Dyspnea 08/04/2009  . Osteoporosis 11/11/2008  . BACK PAIN, LUMBAR, WITH RADICULOPATHY 07/28/2008  . TRANSAMINASES, SERUM, ELEVATED 03/18/2008  . GENITAL HERPES 09/29/2007  . 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 Hyperlipidemia 02/03/2007  . VARCIES, SITE [place] 09/30/2006  . ANXIETY 02/27/2006  . Essential hypertension 02/27/2006  . Allergic rhinitis  02/27/2006  . GERD 02/27/2006  . Crohn's disease of small intestine with complication [facility]) 16/94/5038    Teena Irani, PTA/CLT [phone]  07/11/2016, 1:34 PM  [place] [address] [place], [place], [address] Phone: [phone]   Fax:  [phone]  Name: [name] [name] MRN: [id number] Date of Birth: December 04, 1955

## 2016-07-13 ENCOUNTER — Other Ambulatory Visit: Payer: Self-pay | Admitting: Family Medicine

## 2016-07-15 ENCOUNTER — Ambulatory Visit (HOSPITAL_COMMUNITY): Payer: Medicare Other | Admitting: Physical Therapy

## 2016-07-15 ENCOUNTER — Telehealth (HOSPITAL_COMMUNITY): Payer: Self-pay | Admitting: Family Medicine

## 2016-07-16 DIAGNOSIS — H5211 Myopia, right eye: Secondary | ICD-10-CM | POA: Diagnosis not present

## 2016-07-16 DIAGNOSIS — H25013 Cortical age-related cataract, bilateral: Secondary | ICD-10-CM | POA: Diagnosis not present

## 2016-07-16 DIAGNOSIS — H524 Presbyopia: Secondary | ICD-10-CM | POA: Diagnosis not present

## 2016-07-16 DIAGNOSIS — H04123 Dry eye syndrome of bilateral lacrimal glands: Secondary | ICD-10-CM | POA: Diagnosis not present

## 2016-07-16 DIAGNOSIS — H52201 Unspecified astigmatism, right eye: Secondary | ICD-10-CM | POA: Diagnosis not present

## 2016-07-16 DIAGNOSIS — H2513 Age-related nuclear cataract, bilateral: Secondary | ICD-10-CM | POA: Diagnosis not present

## 2016-07-18 ENCOUNTER — Ambulatory Visit (HOSPITAL_COMMUNITY): Payer: Medicare Other | Admitting: Physical Therapy

## 2016-07-18 DIAGNOSIS — R2681 Unsteadiness on feet: Secondary | ICD-10-CM

## 2016-07-18 DIAGNOSIS — R262 Difficulty in walking, not elsewhere classified: Secondary | ICD-10-CM

## 2016-07-18 NOTE — Therapy (Signed)
[place], [place], [address] Phone: [phone]   Fax:  [phone]  Physical Therapy Treatment  Patient Details  Name: [name] [name] MRN: [id number] Date of Birth: [birth year] Referring Provider: Andrey Spearman   Encounter Date: 07/18/2016      PT End of Session - 07/18/16 1204    Visit Number 7   Number of Visits 13   Date for PT Re-Evaluation 08/08/16   Authorization Type Medicare   Authorization - Visit Number 7   Authorization - Number of Visits 13   PT Start Time 1033   PT Stop Time 1118   PT Time Calculation (min) 45 min   Equipment Utilized During Treatment Gait belt   Activity Tolerance Patient tolerated treatment well   Behavior During Therapy [facility] for tasks assessed/performed      Past Medical History:  Diagnosis Date  . Allergic rhinitis   . Anastomotic ulcer JAN 2009  . Anxiety   . Asthma   . BMI (body mass index) 20.0-29.9 2009 121 lbs  . 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 Crohn disease ([place])   . Crohn disease ([place])   . CTS (carpal tunnel syndrome)   . Diarrhea MULITIFACTORIAL   IBS, LACTOSE INTOLERANCE, SBBO, BILE-SALT  . Elevated liver enzymes 2009: BMI 24 ?Etoh ALT 94, AST 51,  NEG ANA, qIGs& ASMA   MAY 2012 AST 52 ALT 38  . Esophageal stricture 2009  . GERD (gastroesophageal reflux disease)   . Hemorrhoid   . Hyperlipemia   . Hypertension   . Inflammatory bowel disease 2006 CD   SURGICAL REMISSION  . LBP (low back pain)   . Migraine   . PONV (postoperative nausea and vomiting)   . Shortness of breath    exertion and humidity  . Sleep apnea    Stop [name]    Past Surgical History:  Procedure Laterality Date  . BACK SURGERY    . BILATERAL SALPINGOOPHORECTOMY    . BIOPSY  12/24/2011   Procedure: BIOPSY;  Surgeon: Danie Binder, MD;  Location: [facility] ORS;  Service: Endoscopy;;  Gastric Biopsies  . BIOPSY N/A 06/30/2012   Procedure: BIOPSY;  Surgeon:  Danie Binder, MD;  Location: [facility];  Service: Endoscopy;  Laterality: N/A;  Gastric and Esophageal Biopsies  . CARPAL TUNNEL RELEASE  LEFT  . COLON SURGERY    . COLONOSCOPY  JAN 2009 DIARRHEA, ABD PAIN, BRBPR   ANASTOMOTIC ULCER(3MM), IH FB:PPHKFE ULCER, NL COLON bX  . DILATION AND CURETTAGE OF UTERUS    . ESOPHAGEAL DILATION  12/24/2011   SLF: A stricture was found in the distal esophagus/ Moderate gastritis/ MILD Duodenitis  . ESOPHAGOGASTRODUODENOSCOPY  02/07/09   mild gastritis  . ESOPHAGOGASTRODUODENOSCOPY (EGD) WITH PROPOFOL N/A 06/30/2012   SLF: 1. No definite stricture appreciated 2. small hiatal hernia 3. Gastritis  . ESOPHAGOGASTRODUODENOSCOPY (EGD) WITH PROPOFOL N/A 02/20/2016   Procedure: ESOPHAGOGASTRODUODENOSCOPY (EGD) WITH PROPOFOL;  Surgeon: Danie Binder, MD;  Location: [facility] SU[facility];  Service: Endoscopy;  Laterality: N/A;  930  . gum flap Bilateral   . HEMICOLECTOMY  RIGHT 2006  . HERNIA REPAIR    . LUMBAR DISC SURGERY    . SAVORY DILATION N/A 06/30/2012   Procedure: SAVORY DILATION;  Surgeon: Danie Binder, MD;  Location: [facility];  Service: Endoscopy;  Laterality: N/A;  12.32m , 154m 1525m. SAVORY DILATION N/A 02/20/2016   Procedure: SAVORY  DILATION;  Surgeon: Danie Binder, MD;  Location: [facility];  Service: Endoscopy;  Laterality: N/A;  . UPPER GASTROINTESTINAL ENDOSCOPY  JAN 2009 ABD PAIN   GASTRITIS, ESO RING  . UPPER GASTROINTESTINAL ENDOSCOPY  OCT 2010 ABD PAIN, DYSPHAGIA   DIL 15MM, GASTRITIS, NL DUODENUM  . UPPER GASTROINTESTINAL ENDOSCOPY  OCT 2010 DYSPHAGIA   DIL 17 MM, GASTRITIS, NL DUODENUM  . vocal cord surgery  Sep 20, 2010   precancerous areas removed  . wisdom tooth extraction Right    pin placement due to broken jaw    There were no vitals filed for this visit.      Subjective Assessment - 07/18/16 1034    Subjective Pt is going to get her cataracts removed at the end of this month.  She has been doing her HEP    Currently in Pain?  Yes  Took a pain pill            [facility] PT Assessment - 07/18/16 0001      Assessment   Medical Diagnosis unsteady gait   Referring Provider Vikram Penumalli    Onset Date/Surgical Date 05/05/16   Next MD Visit not scheduled   Prior Therapy not for this diagnosis      Precautions   Precautions None     Restrictions   Weight Bearing Restrictions No     Balance Screen   Has the patient fallen in the past 6 months Yes   How many times? 1   Has the patient had a decrease in activity level because of a fear of falling?  Yes   Is the patient reluctant to leave their home because of a fear of falling?  Yes     [place] residence     Prior Function   Level of Independence Independent with basic ADLs   Vocation On disability  Do housework a little at a time due to chronic back pain.    Leisure walking dog, taking care of her garden      Cognition   Overall Cognitive Status Within Functional Limits for tasks assessed     Observation/Other Assessments   Focus on Therapeutic Outcomes (FOTO)  40     Functional Tests   Functional tests Single leg stance;Sit to Stand     Single Leg Stance   Comments Lt 19 ; Rt 60   was Lt unable  Rt 8 seconds      Sit to Stand   Comments 5 x in 26.79  was 32.42      ROM / Strength   AROM / PROM / Strength Strength     Strength   Overall Strength Within functional limits for tasks performed            Vestibular Assessment - 07/18/16 0001      Vestibular Assessment   General Observation --  Pt has sunglassess      Symptom Behavior   Type of Dizziness Spinning   Frequency of Dizziness getting out of bed is the worst; sit to stand  was constant but no comes and goes.   always dizzy but has bouts where intensity is greater    Duration of Dizziness --  does not know for sure but about five minutes    Aggravating Factors Lying supine;Moving eyes;Turning body quickly;Supine to sit;Rolling to  right;Forward bending     Occulomotor Exam   Occulomotor Alignment Normal   Smooth Pursuits Saccades  Saccades Intact     Vestibulo-Occular Reflex   VOR 1 Head Only (x 1 viewing) normal      Positional Testing   Dix-Hallpike Dix-Hallpike Right;Dix-Hallpike Left   Sidelying Test Sidelying Right;Sidelying Left   Horizontal Canal Testing Horizontal Canal Right;Horizontal Canal Left     Dix-Hallpike Right   Dix-Hallpike Right Duration Pt complains of significant  dizziness but has no nystagmus    Dix-Hallpike Right Symptoms Upbeat, right rotatory nystagmus     Dix-Hallpike Left   Dix-Hallpike Left Symptoms No nystagmus     Sidelying Right   Sidelying Right Duration 10   Sidelying Right Symptoms No nystagmus     Sidelying Left   Sidelying Left Symptoms No nystagmus     Horizontal Canal Right   Horizontal Canal Right Duration negative   Horizontal Canal Right Symptoms Normal     Horizontal Canal Left   Horizontal Canal Left Duration negative    Horizontal Canal Left Symptoms Normal     Positional Sensitivities   Sit to Supine Lightheadedness   Supine to Left Side Mild dizziness   Supine to Sitting Mild dizziness                  Vestibular Treatment/Exercise - 07/18/16 0001      Vestibular Treatment/Exercise   Vestibular Treatment Provided Canalith Repositioning   Canalith Repositioning Epley Manuever Right            Balance Exercises - 07/18/16 1056      Balance Exercises: Standing   Standing Eyes Opened Narrow base of support (BOS);Head turns;Other reps (comment);Foam/compliant surface   Balance Beam tandem and retro x 2 RT    Tandem Gait Forward;Retro;2 reps   Sidestepping Foam/compliant support;2 reps   Sit to Stand Time 10             PT Short Term Goals - 07/18/16 1214      PT SHORT TERM GOAL #1   Title Pt to be able to stand without UE assist for five minutes to allow completion of self grooming with improved safety   Time 2    Period Weeks   Status Partially Met  certain days yes others no     PT SHORT TERM GOAL #2   Title PT to state that her dizziness frequency and intensity have decreased by 25%  to reduce risk of falling    Time 2   Period Weeks   Status Achieved     PT SHORT TERM GOAL #3   Title Pt to be able to walk for 10 minutes without holding onto walls to reduce risk of falling.    Time 2   Period Weeks   Status On-going           PT Long Term Goals - 07/18/16 1214      PT LONG TERM GOAL #1   Title Pt to be able to stand without UE support for 15 minutes to be able to make a small meal without risk of falling    Time 4   Period Weeks   Status On-going     PT LONG TERM GOAL #2   Title PT to have resumed driving    Time 4   Period Weeks   Status On-going     PT LONG TERM GOAL #3   Title Pt to be able to single leg stance for 20 seconds B for reduce risk of falling    Time 4   Period Weeks  Status Partially Met  Able to do so on right but not on left                Plan - 07/18/16 1207    Clinical Impression Statement Ms. Parisi has improved with her Single leg stance and overall stability but continues to have periodic dizziness.  Pt was positive for Rt Hal-[place] Dix remover therefore Eply's manuever completed at the end of the session.  She will continue to benefit from skilled physical therapy for an additional three weeks to decrease her risk of falling and increase her functional ability.    Rehab Potential Good   PT Frequency 2x / week   PT Duration 7weeks   PT Treatment/Interventions ADLs/Self Care Home Management;Canalith Repostioning;Gait training;Stair training;Functional mobility training;Therapeutic activities;Therapeutic exercise;Balance training;Neuromuscular re-education;Patient/family education;Manual techniques   PT Next Visit Plan Complete eply's remover if pt has a postitive [employer].  Work on increasing velocity of speed.       Patient will  benefit from skilled therapeutic intervention in order to improve the following deficits and impairments:  Abnormal gait, Decreased activity tolerance, Decreased balance, Difficulty walking  Visit Diagnosis: Unsteadiness on feet  Difficulty in walking, not elsewhere classified     Problem List Patient Active Problem List   Diagnosis Date Noted  . Loss of weight 07/04/2015  . Osteoarthritis 12/27/2014  . Allergic dermatitis eyelid 12/27/2014  . Impetigo 04/05/2014  . Ganglion cyst of wrist 04/05/2014  . Abnormal CT scan, chest 01/12/2014  . Seborrheic dermatitis of scalp 04/12/2013  . Infected insect bite of left breast 11/17/2012  . Polycythemia 09/08/2012  . Vertigo 01/22/2012  . Chronic pain 12/27/2011  . Dysphagia 03/14/2011  . DEPRESSION 04/24/2010  . Chronic bronchitis ([place]) 01/19/2010  . Dyspnea 08/04/2009  . Osteoporosis 11/11/2008  . BACK PAIN, LUMBAR, WITH RADICULOPATHY 07/28/2008  . TRANSAMINASES, SERUM, ELEVATED 03/18/2008  . GENITAL HERPES 09/29/2007  . 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 Hyperlipidemia 02/03/2007  . VARCIES, SITE [place] 09/30/2006  . ANXIETY 02/27/2006  . Essential hypertension 02/27/2006  . Allergic rhinitis 02/27/2006  . GERD 02/27/2006  . Crohn's disease of small intestine with complication [facility]) 07/35/4301    Rayetta Humphrey, PT CLT [phone] 07/18/2016, 12:15 PM  [place], [place], [address] Phone: [phone]   Fax:  [phone]  Name: [name] [name] MRN: [id number] Date of Birth: 1956/02/02

## 2016-07-24 ENCOUNTER — Encounter (HOSPITAL_COMMUNITY): Payer: Self-pay

## 2016-07-24 ENCOUNTER — Encounter (HOSPITAL_COMMUNITY)
Admission: RE | Admit: 2016-07-24 | Discharge: 2016-07-24 | Disposition: A | Discharge status: Home / Self Care | Payer: Medicare Other | Source: Ambulatory Visit | Attending: Ophthalmology | Admitting: Ophthalmology

## 2016-07-24 ENCOUNTER — Telehealth (HOSPITAL_COMMUNITY): Payer: Self-pay | Admitting: Family Medicine

## 2016-07-24 NOTE — Telephone Encounter (Signed)
07/24/16  cx because she is having eye surgery.  I asked about rescheduling and she said that Jenny Reichmann told her to schedule 4 more weeks but is going to have to wait and see what Dr. Gershon Crane says and she will call back.

## 2016-07-25 ENCOUNTER — Ambulatory Visit (HOSPITAL_COMMUNITY): Payer: Medicare Other | Admitting: Physical Therapy

## 2016-07-29 ENCOUNTER — Other Ambulatory Visit: Payer: Self-pay | Admitting: Family Medicine

## 2016-07-30 ENCOUNTER — Ambulatory Visit (HOSPITAL_COMMUNITY): Payer: Medicare Other | Admitting: Anesthesiology

## 2016-07-30 ENCOUNTER — Encounter (HOSPITAL_COMMUNITY)
Admission: RE | Disposition: A | Discharge status: Home / Self Care | Payer: Self-pay | Source: Ambulatory Visit | Attending: Ophthalmology

## 2016-07-30 ENCOUNTER — Ambulatory Visit (HOSPITAL_COMMUNITY)
Admission: RE | Admit: 2016-07-30 | Discharge: 2016-07-30 | Disposition: A | Discharge status: Home / Self Care | Payer: Medicare Other | Source: Ambulatory Visit | Attending: Ophthalmology | Admitting: Ophthalmology

## 2016-07-30 ENCOUNTER — Encounter (HOSPITAL_COMMUNITY): Payer: Self-pay | Admitting: *Deleted

## 2016-07-30 DIAGNOSIS — Z79899 Other long term (current) drug therapy: Secondary | ICD-10-CM | POA: Insufficient documentation

## 2016-07-30 DIAGNOSIS — H2512 Age-related nuclear cataract, left eye: Secondary | ICD-10-CM | POA: Diagnosis not present

## 2016-07-30 DIAGNOSIS — K279 Peptic ulcer, site unspecified, unspecified as acute or chronic, without hemorrhage or perforation: Secondary | ICD-10-CM | POA: Insufficient documentation

## 2016-07-30 DIAGNOSIS — Z87891 Personal history of nicotine dependence: Secondary | ICD-10-CM | POA: Diagnosis not present

## 2016-07-30 DIAGNOSIS — J449 Chronic obstructive pulmonary disease, unspecified: Secondary | ICD-10-CM | POA: Diagnosis not present

## 2016-07-30 DIAGNOSIS — K219 Gastro-esophageal reflux disease without esophagitis: Secondary | ICD-10-CM | POA: Diagnosis not present

## 2016-07-30 DIAGNOSIS — R51 Headache: Secondary | ICD-10-CM | POA: Insufficient documentation

## 2016-07-30 DIAGNOSIS — F419 Anxiety disorder, unspecified: Secondary | ICD-10-CM | POA: Insufficient documentation

## 2016-07-30 DIAGNOSIS — F329 Major depressive disorder, single episode, unspecified: Secondary | ICD-10-CM | POA: Diagnosis not present

## 2016-07-30 DIAGNOSIS — I10 Essential (primary) hypertension: Secondary | ICD-10-CM | POA: Insufficient documentation

## 2016-07-30 DIAGNOSIS — G4730 Sleep apnea, unspecified: Secondary | ICD-10-CM | POA: Insufficient documentation

## 2016-07-30 DIAGNOSIS — H2511 Age-related nuclear cataract, right eye: Secondary | ICD-10-CM | POA: Diagnosis not present

## 2016-07-30 DIAGNOSIS — H25011 Cortical age-related cataract, right eye: Secondary | ICD-10-CM | POA: Diagnosis not present

## 2016-07-30 HISTORY — PX: CATARACT EXTRACTION W/PHACO: SHX586

## 2016-07-30 SURGERY — PHACOEMULSIFICATION, CATARACT, WITH IOL INSERTION
Anesthesia: Monitor Anesthesia Care | Site: Eye | Laterality: Right

## 2016-07-30 MED ORDER — LACTATED RINGERS IV SOLN
INTRAVENOUS | Status: DC
  Administered 2016-07-30: 09:00:00 via INTRAVENOUS

## 2016-07-30 MED ORDER — PHENYLEPHRINE HCL 2.5 % OP SOLN
1.0000 [drp] | OPHTHALMIC | Status: AC
  Administered 2016-07-30 (×3): 1 [drp] via OPHTHALMIC

## 2016-07-30 MED ORDER — CYCLOPENTOLATE-PHENYLEPHRINE 0.2-1 % OP SOLN
1.0000 [drp] | OPHTHALMIC | Status: AC
  Administered 2016-07-30 (×3): 1 [drp] via OPHTHALMIC

## 2016-07-30 MED ORDER — FENTANYL CITRATE (PF) 100 MCG/2ML IJ SOLN
25.0000 ug | INTRAMUSCULAR | Status: AC
  Administered 2016-07-30 (×2): 25 ug via INTRAVENOUS

## 2016-07-30 MED ORDER — MIDAZOLAM HCL 2 MG/2ML IJ SOLN
1.0000 mg | INTRAMUSCULAR | Status: AC
  Administered 2016-07-30: 2 mg via INTRAVENOUS

## 2016-07-30 MED ORDER — PROVISC 10 MG/ML IO SOLN
INTRAOCULAR | Status: DC | PRN
  Administered 2016-07-30: 0.85 mL via INTRAOCULAR

## 2016-07-30 MED ORDER — BSS IO SOLN
INTRAOCULAR | Status: DC | PRN
  Administered 2016-07-30: 15 mL

## 2016-07-30 MED ORDER — KETOROLAC TROMETHAMINE 0.5 % OP SOLN
1.0000 [drp] | OPHTHALMIC | Status: AC
  Administered 2016-07-30 (×3): 1 [drp] via OPHTHALMIC

## 2016-07-30 MED ORDER — MIDAZOLAM HCL 2 MG/2ML IJ SOLN
INTRAMUSCULAR | Status: AC
  Filled 2016-07-30: qty 2

## 2016-07-30 MED ORDER — FENTANYL CITRATE (PF) 100 MCG/2ML IJ SOLN
INTRAMUSCULAR | Status: AC
  Filled 2016-07-30: qty 2

## 2016-07-30 MED ORDER — TETRACAINE 0.5 % OP SOLN OPTIME - NO CHARGE
OPHTHALMIC | Status: DC | PRN
  Administered 2016-07-30: 2 [drp] via OPHTHALMIC

## 2016-07-30 MED ORDER — BSS IO SOLN
INTRAOCULAR | Status: DC | PRN
  Administered 2016-07-30: 500 mL

## 2016-07-30 MED ORDER — TETRACAINE HCL 0.5 % OP SOLN
1.0000 [drp] | OPHTHALMIC | Status: AC
  Administered 2016-07-30 (×3): 1 [drp] via OPHTHALMIC

## 2016-07-30 MED ORDER — MIDAZOLAM HCL 2 MG/2ML IJ SOLN
INTRAMUSCULAR | Status: DC | PRN
  Administered 2016-07-30: 2 mg via INTRAVENOUS

## 2016-07-30 SURGICAL SUPPLY — 10 items
CLOTH BEACON ORANGE TIMEOUT ST (SAFETY) ×3 IMPLANT
EYE SHIELD UNIVERSAL CLEAR (GAUZE/BANDAGES/DRESSINGS) ×3 IMPLANT
GLOVE BIOGEL PI IND STRL 6.5 (GLOVE) ×1 IMPLANT
GLOVE BIOGEL PI INDICATOR 6.5 (GLOVE) ×2
GLOVE EXAM NITRILE MD LF STRL (GLOVE) ×3 IMPLANT
LENS ALC ACRYL/TECN (Ophthalmic Related) ×3 IMPLANT
PAD ARMBOARD 7.5X6 YLW CONV (MISCELLANEOUS) ×3 IMPLANT
TAPE SURG TRANSPORE 1 IN (GAUZE/BANDAGES/DRESSINGS) ×1 IMPLANT
TAPE SURGICAL TRANSPORE 1 IN (GAUZE/BANDAGES/DRESSINGS) ×2
WATER STERILE IRR 250ML POUR (IV SOLUTION) ×3 IMPLANT

## 2016-07-30 NOTE — Telephone Encounter (Signed)
okay

## 2016-07-30 NOTE — Transfer of Care (Signed)
Immediate Anesthesia Transfer of Care Note  Patient: [name] [name]  Procedure(s) Performed: Procedure(s) (LRB): CATARACT EXTRACTION PHACO AND INTRAOCULAR LENS PLACEMENT (IOC) (Right)  Patient Location: Shortstay  Anesthesia Type: MAC  Level of Consciousness: awake  Airway & Oxygen Therapy: Patient Spontanous Breathing   Post-op Assessment: Report given to PACU RN, Post -op Vital signs reviewed and stable and Patient moving all extremities  Post vital signs: Reviewed and stable  Complications: No apparent anesthesia complications

## 2016-07-30 NOTE — Telephone Encounter (Signed)
Medication called to pharmacy. 

## 2016-07-30 NOTE — Discharge Instructions (Signed)
°  °          [facility] Instructions °[address]- [place] [address] °    ° °1. Avoid closing eyes tightly. One often closes the eye tightly when laughing, talking, sneezing, coughing or if they feel irritated. At these times, you should be careful not to close your eyes tightly. ° °2. Instill eye drops as instructed. To instill drops in your eye, open it, look up and have someone gently pull the lower lid down and instill a couple of drops inside the lower lid. ° °3. Do not touch upper lid. ° °4. Take Advil or Tylenol for pain. ° °5. You may use either eye for near work, such as reading or sewing and you may watch television. ° °6. You may have your hair done at the beauty parlor at any time. ° °7. Wear dark glasses with or without your own glasses if you are in bright light. ° °8. Call our office at [phone] or [phone] if you have sharp pain in your eye or unusual symptoms. ° °9.  FOLLOW UP WITH DR. SHAPIRO TODAY IN HIS [place] OFFICE AT 2:45pm. ° °  °I have received a copy of the above instructions and will follow them.  ° ° ° °IF YOU ARE IN IMMEDIATE DANGER CALL 911! ° °It is important for you to keep your follow-up appointment with your physician after discharge, OR, for you /your caregiver to make a follow-up appointment with your physician / medical provider after discharge. ° °Show these instructions to the next healthcare provider you see. ° °

## 2016-07-30 NOTE — Anesthesia Postprocedure Evaluation (Signed)
Anesthesia Post Note  Patient: [name] [name]  Procedure(s) Performed: Procedure(s) (LRB): CATARACT EXTRACTION PHACO AND INTRAOCULAR LENS PLACEMENT (IOC) (Right)  Patient location during evaluation: Short Stay Anesthesia Type: MAC Level of consciousness: awake and alert Pain management: pain level controlled Vital Signs Assessment: post-procedure vital signs reviewed and stable Respiratory status: spontaneous breathing Anesthetic complications: no     Last Vitals: There were no vitals filed for this visit.  Last Pain: There were no vitals filed for this visit.               Drucie Opitz

## 2016-07-30 NOTE — Anesthesia Procedure Notes (Signed)
Procedure Name: MAC Date/Time: 07/30/2016 9:21 AM Performed by: Vista Deck Pre-anesthesia Checklist: Patient identified, Emergency Drugs available, Suction available, Timeout performed and Patient being monitored Patient Re-evaluated:Patient Re-evaluated prior to inductionOxygen Delivery Method: Nasal Cannula

## 2016-07-30 NOTE — H&P (Signed)
The patient was re examined and there is no change in the patients condition since the original H and P. 

## 2016-07-30 NOTE — Telephone Encounter (Signed)
Ok to refill??  Last office visit 06/24/2016.  Last refill 04/22/2016, #2 refills.

## 2016-07-30 NOTE — Op Note (Signed)
Patient brought to the operating room and prepped and draped in the usual manner.  Lid speculum inserted in right eye.  Stab incision made at the twelve o'clock position.  Provisc instilled in the anterior chamber.   A 2.4 mm. Stab incision was made temporally.  An anterior capsulotomy was done with a bent 25 gauge needle.  The nucleus was hydrodissected.  The Phaco tip was inserted in the anterior chamber and the nucleus was emulsified.  CDE was 5.45.  The cortical material was then removed with the I and A tip.  Posterior capsule was the polished.  The anterior chamber was deepened with Provisc.  A 20.0 Diopter Alcon AU00T0 IOL was then inserted in the capsular bag.  Provisc was then removed with the I and A tip.  The wound was then hydrated.  Patient sent to the Recovery Room in good condition with follow up in my office.  Preoperative Diagnosis:  Nuclear Cataract OD Postoperative Diagnosis:  Same Procedure name: Kelman Phacoemulsification OD with IOL

## 2016-07-30 NOTE — Anesthesia Preprocedure Evaluation (Signed)
Anesthesia Evaluation  Patient identified by MRN, date of birth, ID band Patient awake    Reviewed: Allergy & Precautions, H&P , NPO status , Patient's Chart, lab work & pertinent test results  History of Anesthesia Complications (+) PONV and history of anesthetic complications  Airway Mallampati: II     Mouth opening: Limited Mouth Opening  Dental  (+) Poor Dentition   Pulmonary shortness of breath, asthma , sleep apnea , COPD,  COPD inhaler, former smoker,    breath sounds clear to auscultation       Cardiovascular hypertension, Pt. on medications  Rhythm:Regular Rate:Normal     Neuro/Psych  Headaches, PSYCHIATRIC DISORDERS Anxiety Depression    GI/Hepatic PUD, GERD (esoph stricture)  Medicated,  Endo/Other    Renal/GU      Musculoskeletal   Abdominal   Peds  Hematology   Anesthesia Other Findings   Reproductive/Obstetrics                             Anesthesia Physical Anesthesia Plan  ASA: III  Anesthesia Plan: MAC   Post-op Pain Management:    Induction: Intravenous  Airway Management Planned: Simple Face Mask  Additional Equipment:   Intra-op Plan:   Post-operative Plan:   Informed Consent: I have reviewed the patients History and Physical, chart, labs and discussed the procedure including the risks, benefits and alternatives for the proposed anesthesia with the patient or authorized representative who has indicated his/her understanding and acceptance.     Plan Discussed with:   Anesthesia Plan Comments:         Anesthesia Quick Evaluation

## 2016-07-31 ENCOUNTER — Encounter (HOSPITAL_COMMUNITY): Payer: Self-pay

## 2016-08-01 ENCOUNTER — Encounter (HOSPITAL_COMMUNITY): Payer: Self-pay | Admitting: Ophthalmology

## 2016-08-06 ENCOUNTER — Encounter (HOSPITAL_COMMUNITY)
Admission: RE | Admit: 2016-08-06 | Discharge: 2016-08-06 | Disposition: A | Discharge status: Home / Self Care | Payer: Medicare Other | Source: Ambulatory Visit | Attending: Ophthalmology | Admitting: Ophthalmology

## 2016-08-06 DIAGNOSIS — J449 Chronic obstructive pulmonary disease, unspecified: Secondary | ICD-10-CM | POA: Diagnosis not present

## 2016-08-06 DIAGNOSIS — M545 Low back pain: Secondary | ICD-10-CM | POA: Diagnosis not present

## 2016-08-06 DIAGNOSIS — F0781 Postconcussional syndrome: Secondary | ICD-10-CM | POA: Diagnosis not present

## 2016-08-06 DIAGNOSIS — R1084 Generalized abdominal pain: Secondary | ICD-10-CM | POA: Diagnosis not present

## 2016-08-06 DIAGNOSIS — K50912 Crohn's disease, unspecified, with intestinal obstruction: Secondary | ICD-10-CM | POA: Diagnosis not present

## 2016-08-06 DIAGNOSIS — M79673 Pain in unspecified foot: Secondary | ICD-10-CM | POA: Diagnosis not present

## 2016-08-06 DIAGNOSIS — Z79891 Long term (current) use of opiate analgesic: Secondary | ICD-10-CM | POA: Diagnosis not present

## 2016-08-06 DIAGNOSIS — K589 Irritable bowel syndrome without diarrhea: Secondary | ICD-10-CM | POA: Diagnosis not present

## 2016-08-06 DIAGNOSIS — M542 Cervicalgia: Secondary | ICD-10-CM | POA: Diagnosis not present

## 2016-08-12 MED ORDER — PHENYLEPHRINE HCL 2.5 % OP SOLN
OPHTHALMIC | Status: AC
  Filled 2016-08-12: qty 15

## 2016-08-12 MED ORDER — TETRACAINE HCL 0.5 % OP SOLN
OPHTHALMIC | Status: AC
  Filled 2016-08-12: qty 4

## 2016-08-12 MED ORDER — CYCLOPENTOLATE-PHENYLEPHRINE 0.2-1 % OP SOLN
OPHTHALMIC | Status: AC
  Filled 2016-08-12: qty 2

## 2016-08-12 MED ORDER — KETOROLAC TROMETHAMINE 0.5 % OP SOLN
OPHTHALMIC | Status: AC
  Filled 2016-08-12: qty 5

## 2016-08-13 ENCOUNTER — Encounter (HOSPITAL_COMMUNITY)
Admission: RE | Disposition: A | Discharge status: Home / Self Care | Payer: Self-pay | Source: Ambulatory Visit | Attending: Ophthalmology

## 2016-08-13 ENCOUNTER — Ambulatory Visit (HOSPITAL_COMMUNITY): Payer: Medicare Other | Admitting: Anesthesiology

## 2016-08-13 ENCOUNTER — Ambulatory Visit (HOSPITAL_COMMUNITY)
Admission: RE | Admit: 2016-08-13 | Discharge: 2016-08-13 | Disposition: A | Discharge status: Home / Self Care | Payer: Medicare Other | Source: Ambulatory Visit | Attending: Ophthalmology | Admitting: Ophthalmology

## 2016-08-13 ENCOUNTER — Encounter (HOSPITAL_COMMUNITY): Payer: Self-pay | Admitting: *Deleted

## 2016-08-13 DIAGNOSIS — I10 Essential (primary) hypertension: Secondary | ICD-10-CM | POA: Diagnosis not present

## 2016-08-13 DIAGNOSIS — J449 Chronic obstructive pulmonary disease, unspecified: Secondary | ICD-10-CM | POA: Insufficient documentation

## 2016-08-13 DIAGNOSIS — Z87891 Personal history of nicotine dependence: Secondary | ICD-10-CM | POA: Diagnosis not present

## 2016-08-13 DIAGNOSIS — R51 Headache: Secondary | ICD-10-CM | POA: Insufficient documentation

## 2016-08-13 DIAGNOSIS — K279 Peptic ulcer, site unspecified, unspecified as acute or chronic, without hemorrhage or perforation: Secondary | ICD-10-CM | POA: Diagnosis not present

## 2016-08-13 DIAGNOSIS — K219 Gastro-esophageal reflux disease without esophagitis: Secondary | ICD-10-CM | POA: Diagnosis not present

## 2016-08-13 DIAGNOSIS — F419 Anxiety disorder, unspecified: Secondary | ICD-10-CM | POA: Insufficient documentation

## 2016-08-13 DIAGNOSIS — G4730 Sleep apnea, unspecified: Secondary | ICD-10-CM | POA: Diagnosis not present

## 2016-08-13 DIAGNOSIS — H2512 Age-related nuclear cataract, left eye: Secondary | ICD-10-CM | POA: Diagnosis not present

## 2016-08-13 DIAGNOSIS — F329 Major depressive disorder, single episode, unspecified: Secondary | ICD-10-CM | POA: Insufficient documentation

## 2016-08-13 DIAGNOSIS — H269 Unspecified cataract: Secondary | ICD-10-CM | POA: Diagnosis not present

## 2016-08-13 DIAGNOSIS — H25012 Cortical age-related cataract, left eye: Secondary | ICD-10-CM | POA: Diagnosis not present

## 2016-08-13 DIAGNOSIS — Z79899 Other long term (current) drug therapy: Secondary | ICD-10-CM | POA: Diagnosis not present

## 2016-08-13 HISTORY — PX: CATARACT EXTRACTION W/PHACO: SHX586

## 2016-08-13 SURGERY — PHACOEMULSIFICATION, CATARACT, WITH IOL INSERTION
Anesthesia: Monitor Anesthesia Care | Site: Eye | Laterality: Left

## 2016-08-13 MED ORDER — MIDAZOLAM HCL 2 MG/2ML IJ SOLN
INTRAMUSCULAR | Status: AC
Start: 2016-08-13 — End: ?
  Filled 2016-08-13: qty 2

## 2016-08-13 MED ORDER — MIDAZOLAM HCL 2 MG/2ML IJ SOLN
INTRAMUSCULAR | Status: AC
  Filled 2016-08-13: qty 2

## 2016-08-13 MED ORDER — KETOROLAC TROMETHAMINE 0.5 % OP SOLN
1.0000 [drp] | OPHTHALMIC | Status: AC
  Administered 2016-08-13 (×3): 1 [drp] via OPHTHALMIC

## 2016-08-13 MED ORDER — MIDAZOLAM HCL 2 MG/2ML IJ SOLN
1.0000 mg | INTRAMUSCULAR | Status: AC
  Administered 2016-08-13 (×2): 2 mg via INTRAVENOUS

## 2016-08-13 MED ORDER — TETRACAINE HCL 0.5 % OP SOLN
1.0000 [drp] | OPHTHALMIC | Status: AC
  Administered 2016-08-13 (×3): 1 [drp] via OPHTHALMIC

## 2016-08-13 MED ORDER — EPINEPHRINE PF 1 MG/ML IJ SOLN
INTRAMUSCULAR | Status: AC
  Filled 2016-08-13: qty 1

## 2016-08-13 MED ORDER — FENTANYL CITRATE (PF) 100 MCG/2ML IJ SOLN
INTRAMUSCULAR | Status: AC
  Filled 2016-08-13: qty 2

## 2016-08-13 MED ORDER — EPINEPHRINE PF 1 MG/ML IJ SOLN
INTRAOCULAR | Status: DC | PRN
  Administered 2016-08-13: 1 mL

## 2016-08-13 MED ORDER — PROVISC 10 MG/ML IO SOLN
INTRAOCULAR | Status: DC | PRN
  Administered 2016-08-13: 0.85 mL via INTRAOCULAR

## 2016-08-13 MED ORDER — LIDOCAINE HCL (PF) 1 % IJ SOLN
INTRAMUSCULAR | Status: AC
  Filled 2016-08-13: qty 2

## 2016-08-13 MED ORDER — CYCLOPENTOLATE-PHENYLEPHRINE 0.2-1 % OP SOLN
1.0000 [drp] | OPHTHALMIC | Status: AC
  Administered 2016-08-13 (×3): 1 [drp] via OPHTHALMIC

## 2016-08-13 MED ORDER — FENTANYL CITRATE (PF) 100 MCG/2ML IJ SOLN
25.0000 ug | INTRAMUSCULAR | Status: AC
  Administered 2016-08-13 (×2): 25 ug via INTRAVENOUS

## 2016-08-13 MED ORDER — BSS IO SOLN
INTRAOCULAR | Status: DC | PRN
  Administered 2016-08-13: 15 mL

## 2016-08-13 MED ORDER — LACTATED RINGERS IV SOLN
INTRAVENOUS | Status: DC
  Administered 2016-08-13: 1000 mL via INTRAVENOUS

## 2016-08-13 MED ORDER — PHENYLEPHRINE HCL 2.5 % OP SOLN
1.0000 [drp] | OPHTHALMIC | Status: AC
  Administered 2016-08-13 (×3): 1 [drp] via OPHTHALMIC

## 2016-08-13 SURGICAL SUPPLY — 9 items
CLOTH BEACON ORANGE TIMEOUT ST (SAFETY) ×3 IMPLANT
EYE SHIELD UNIVERSAL CLEAR (GAUZE/BANDAGES/DRESSINGS) ×3 IMPLANT
GLOVE ECLIPSE 6.5 STRL STRAW (GLOVE) ×3 IMPLANT
GLOVE EXAM NITRILE MD LF STRL (GLOVE) ×3 IMPLANT
LENS ALC ACRYL/TECN (Ophthalmic Related) ×3 IMPLANT
PAD ARMBOARD 7.5X6 YLW CONV (MISCELLANEOUS) ×3 IMPLANT
TAPE SURG TRANSPORE 1 IN (GAUZE/BANDAGES/DRESSINGS) ×1 IMPLANT
TAPE SURGICAL TRANSPORE 1 IN (GAUZE/BANDAGES/DRESSINGS) ×2
WATER STERILE IRR 250ML POUR (IV SOLUTION) ×3 IMPLANT

## 2016-08-13 NOTE — Discharge Instructions (Signed)
PATIENT INSTRUCTIONS POST-ANESTHESIA  IMMEDIATELY FOLLOWING SURGERY:  Do not drive or operate machinery for the first twenty four hours after surgery.  Do not make any important decisions for twenty four hours after surgery or while taking narcotic pain medications or sedatives.  If you develop intractable nausea and vomiting or a severe headache please notify your doctor immediately.  FOLLOW-UP:  Please make an appointment with your surgeon as instructed. You do not need to follow up with anesthesia unless specifically instructed to do so.  WOUND CARE INSTRUCTIONS (if applicable):  Keep a dry clean dressing on the anesthesia/puncture wound site if there is drainage.  Once the wound has quit draining you may leave it open to air.  Generally you should leave the bandage intact for twenty four hours unless there is drainage.  If the epidural site drains for more than 36-48 hours please call the anesthesia department.               [facility] Instructions [place] [address]      1. Avoid closing eyes tightly. One often closes the eye tightly when laughing, talking, sneezing, coughing or if they feel irritated. At these times, you should be careful not to close your eyes tightly.  2. Instill eye drops as instructed. To instill drops in your eye, open it, look up and have someone gently pull the lower lid down and instill a couple of drops inside the lower lid.  3. Do not touch upper lid.  4. Take Advil or Tylenol for pain.  5. You may use either eye for near work, such as reading or sewing and you may watch television.  6. You may have your hair done at the beauty parlor at any time.  7. Wear dark glasses with or without your own glasses if you are in bright light.  8. Call our office at [phone] or [phone] if you have sharp pain in your eye or unusual symptoms.  9.  FOLLOW UP WITH DR. SHAPIRO TODAY IN HIS [place] OFFICE AT  pm.    I have received a copy of the above instructions and will follow them.     IF YOU ARE IN IMMEDIATE DANGER CALL 911!  It is important for you to keep your follow-up appointment with your physician after discharge, OR, for you /your caregiver to make a follow-up appointment with your physician / medical provider after discharge.   3:00pm Show these instructions to the next healthcare provider you see.  QUESTIONS?:  Please feel free to call your physician or the hospital operator if you have any questions, and they will be happy to assist you.

## 2016-08-13 NOTE — Op Note (Signed)
Patient brought to the operating room and prepped and draped in the usual manner.  Lid speculum inserted in left eye.  Stab incision made at the twelve o'clock position.  Provisc instilled in the anterior chamber.   A 2.4 mm. Stab incision was made temporally.  An anterior capsulotomy was done with a bent 25 gauge needle.  The nucleus was hydrodissected.  The Phaco tip was inserted in the anterior chamber and the nucleus was emulsified.  CDE was 5.56.  The cortical material was then removed with the I and A tip.  Posterior capsule was the polished.  The anterior chamber was deepened with Provisc.  A 20.0 Diopter Alcon AU00T0 IOL was then inserted in the capsular bag.  Provisc was then removed with the I and A tip.  The wound was then hydrated.  Patient sent to the Recovery Room in good condition with follow up in my office.  Preoperative Diagnosis:  Nuclear Cataract OS Postoperative Diagnosis:  Same Procedure name: Kelman Phacoemulsification OS with IOL

## 2016-08-13 NOTE — Transfer of Care (Signed)
Immediate Anesthesia Transfer of Care Note  Patient: [name] [name]  Procedure(s) Performed: Procedure(s) with comments: CATARACT EXTRACTION PHACO AND INTRAOCULAR LENS PLACEMENT (IOC) (Left) - left  Patient Location: Short Stay  Anesthesia Type:MAC  Level of Consciousness: awake, alert , oriented and patient cooperative  Airway & Oxygen Therapy: Patient Spontanous Breathing  Post-op Assessment: Report given to RN and Post -op Vital signs reviewed and stable  Post vital signs: Reviewed and stable  Last Vitals:  Vitals:   08/13/16 0710 08/13/16 0715  BP: 114/74 93/62  Pulse:    Resp: 16 11  Temp:      Last Pain:  Vitals:   08/13/16 0644  TempSrc: Oral  PainSc: 4       Patients Stated Pain Goal: 6 (34/19/62 2297)  Complications: No apparent anesthesia complications

## 2016-08-13 NOTE — H&P (Signed)
The patient was re examined and there is no change in the patients condition since the original H and P. 

## 2016-08-13 NOTE — Anesthesia Preprocedure Evaluation (Signed)
Anesthesia Evaluation  Patient identified by MRN, date of birth, ID band Patient awake    Reviewed: Allergy & Precautions, H&P , NPO status , Patient's Chart, lab work & pertinent test results  History of Anesthesia Complications (+) PONV and history of anesthetic complications  Airway Mallampati: II     Mouth opening: Limited Mouth Opening  Dental  (+) Poor Dentition   Pulmonary shortness of breath, asthma , sleep apnea , COPD,  COPD inhaler, former smoker,    breath sounds clear to auscultation       Cardiovascular hypertension, Pt. on medications  Rhythm:Regular Rate:Normal     Neuro/Psych  Headaches, PSYCHIATRIC DISORDERS Anxiety Depression    GI/Hepatic PUD, GERD (esoph stricture)  Medicated,  Endo/Other    Renal/GU      Musculoskeletal   Abdominal   Peds  Hematology   Anesthesia Other Findings   Reproductive/Obstetrics                             Anesthesia Physical Anesthesia Plan  ASA: III  Anesthesia Plan: MAC   Post-op Pain Management:    Induction: Intravenous  Airway Management Planned: Simple Face Mask  Additional Equipment:   Intra-op Plan:   Post-operative Plan:   Informed Consent: I have reviewed the patients History and Physical, chart, labs and discussed the procedure including the risks, benefits and alternatives for the proposed anesthesia with the patient or authorized representative who has indicated his/her understanding and acceptance.     Plan Discussed with:   Anesthesia Plan Comments:         Anesthesia Quick Evaluation  

## 2016-08-13 NOTE — Anesthesia Postprocedure Evaluation (Signed)
Anesthesia Post Note  Patient: [name] [name]  Procedure(s) Performed: Procedure(s) (LRB): CATARACT EXTRACTION PHACO AND INTRAOCULAR LENS PLACEMENT (IOC) (Left)  Patient location during evaluation: Short Stay Anesthesia Type: MAC Level of consciousness: awake and alert, oriented and patient cooperative Pain management: pain level controlled Vital Signs Assessment: post-procedure vital signs reviewed and stable Respiratory status: spontaneous breathing Cardiovascular status: stable Postop Assessment: no signs of nausea or vomiting Anesthetic complications: no     Last Vitals:  Vitals:   08/13/16 0710 08/13/16 0715  BP: 114/74 93/62  Pulse:    Resp: 16 11  Temp:      Last Pain:  Vitals:   08/13/16 0644  TempSrc: Oral  PainSc: 4                  Chloe Bluett A

## 2016-08-14 ENCOUNTER — Encounter (HOSPITAL_COMMUNITY): Payer: Self-pay | Admitting: Ophthalmology

## 2016-09-05 DIAGNOSIS — J309 Allergic rhinitis, unspecified: Secondary | ICD-10-CM | POA: Diagnosis not present

## 2016-09-05 DIAGNOSIS — J449 Chronic obstructive pulmonary disease, unspecified: Secondary | ICD-10-CM | POA: Diagnosis not present

## 2016-09-05 DIAGNOSIS — K210 Gastro-esophageal reflux disease with esophagitis: Secondary | ICD-10-CM | POA: Diagnosis not present

## 2016-09-05 DIAGNOSIS — I10 Essential (primary) hypertension: Secondary | ICD-10-CM | POA: Diagnosis not present

## 2016-09-09 ENCOUNTER — Ambulatory Visit (HOSPITAL_COMMUNITY): Payer: Medicare Other | Attending: Diagnostic Neuroimaging | Admitting: Physical Therapy

## 2016-09-09 DIAGNOSIS — R2681 Unsteadiness on feet: Secondary | ICD-10-CM | POA: Insufficient documentation

## 2016-09-09 DIAGNOSIS — R262 Difficulty in walking, not elsewhere classified: Secondary | ICD-10-CM | POA: Diagnosis not present

## 2016-09-09 NOTE — Therapy (Signed)
[place], [place], [address] Phone: [phone]   Fax:  [phone]  Physical Therapy Treatment  Patient Details  Name: [name] [name] MRN: [id number] Date of Birth: 10-08-1955 Referring Provider: Bess Harvest  Encounter Date: 09/09/2016      PT End of Session - 09/09/16 1040    Visit Number 8   Number of Visits 14   Date for PT Re-Evaluation 10/09/16   Authorization Type Medicare   Authorization - Visit Number 7   Authorization - Number of Visits 14   PT Start Time 7341  pt late   PT Stop Time 1032   PT Time Calculation (min) 37 min   Equipment Utilized During Treatment --   Activity Tolerance Other (comment)  dizzy with Hal-Pike Dix manuever    Behavior During Therapy [facility] for tasks assessed/performed      Past Medical History:  Diagnosis Date  . Allergic rhinitis   . Anastomotic ulcer JAN 2009  . Anxiety   . Asthma   . BMI (body mass index) 20.0-29.9 2009 121 lbs  . 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 Crohn disease ([place])   . Crohn disease ([place])   . CTS (carpal tunnel syndrome)   . Diarrhea MULITIFACTORIAL   IBS, LACTOSE INTOLERANCE, SBBO, BILE-SALT  . Elevated liver enzymes 2009: BMI 24 ?Etoh ALT 94, AST 51,  NEG ANA, qIGs& ASMA   MAY 2012 AST 52 ALT 38  . Esophageal stricture 2009  . GERD (gastroesophageal reflux disease)   . Hemorrhoid   . Hyperlipemia   . Hypertension   . Inflammatory bowel disease 2006 CD   SURGICAL REMISSION  . LBP (low back pain)   . Migraine   . PONV (postoperative nausea and vomiting)   . Shortness of breath    exertion and humidity  . Sleep apnea    Stop [name]    Past Surgical History:  Procedure Laterality Date  . BACK SURGERY    . BILATERAL SALPINGOOPHORECTOMY    . BIOPSY  12/24/2011   Procedure: BIOPSY;  Surgeon: Danie Binder, MD;  Location: [facility];  Service: Endoscopy;;  Gastric Biopsies  . BIOPSY N/A 06/30/2012   Procedure:  BIOPSY;  Surgeon: Danie Binder, MD;  Location: [facility]S;  Service: Endoscopy;  Laterality: N/A;  Gastric and Esophageal Biopsies  . CARPAL TUNNEL RELEASE  LEFT  . CATARACT EXTRACTION W/PHACO Right 07/30/2016   Procedure: CATARACT EXTRACTION PHACO AND INTRAOCULAR LENS PLACEMENT (IOC);  Surgeon: Rutherford Guys, MD;  Location: [facility] ORS;  Service: Ophthalmology;  Laterality: Right;  CDE: 5.45  . CATARACT EXTRACTION W/PHACO Left 08/13/2016   Procedure: CATARACT EXTRACTION PHACO AND INTRAOCULAR LENS PLACEMENT (IOC);  Surgeon: Rutherford Guys, MD;  Location: [facility] ORS;  Service: Ophthalmology;  Laterality: Left;  CDE: 5.59  . COLON SURGERY    . COLONOSCOPY  JAN 2009 DIARRHEA, ABD PAIN, BRBPR   ANASTOMOTIC ULCER(3MM), IH PF:XTKWIO ULCER, NL COLON bX  . DILATION AND CURETTAGE OF UTERUS    . ESOPHAGEAL DILATION  12/24/2011   SLF: A stricture was found in the distal esophagus/ Moderate gastritis/ MILD Duodenitis  . ESOPHAGOGASTRODUODENOSCOPY  02/07/09   mild gastritis  . ESOPHAGOGASTRODUODENOSCOPY (EGD) WITH PROPOFOL N/A 06/30/2012   SLF: 1. No definite stricture appreciated 2. small hiatal hernia 3. Gastritis  . ESOPHAGOGASTRODUODENOSCOPY (EGD) WITH PROPOFOL N/A 02/20/2016   Procedure: ESOPHAGOGASTRODUODENOSCOPY (EGD) WITH PROPOFOL;  Surgeon: Danie Binder, MD;  [facility]:  [facility];  Service: Endoscopy;  Laterality: N/A;  930  . gum flap Bilateral   . HEMICOLECTOMY  RIGHT 2006  . HERNIA REPAIR    . LUMBAR DISC SURGERY    . SAVORY DILATION N/A 06/30/2012   Procedure: SAVORY DILATION;  Surgeon: Danie Binder, MD;  Location: [facility] ORS;  Service: Endoscopy;  Laterality: N/A;  12.17m , 137m 1557m. SAVORY DILATION N/A 02/20/2016   Procedure: SAVORY DILATION;  Surgeon: SanDanie BinderD;  Location: [facility];  Service: Endoscopy;  Laterality: N/A;  . UPPER GASTROINTESTINAL ENDOSCOPY  JAN 2009 ABD PAIN   GASTRITIS, ESO RING  . UPPER GASTROINTESTINAL ENDOSCOPY  OCT 2010 ABD PAIN, DYSPHAGIA   DIL 15MM, GASTRITIS,  NL DUODENUM  . UPPER GASTROINTESTINAL ENDOSCOPY  OCT 2010 DYSPHAGIA   DIL 17 MM, GASTRITIS, NL DUODENUM  . vocal cord surgery  Sep 20, 2010   precancerous areas removed  . wisdom tooth extraction Right    pin placement due to broken jaw    There were no vitals filed for this visit.      Subjective Assessment - 09/09/16 1028    Subjective PT has not been to treatment due to having two eye surgeries but she has been doing exercises at home.     Limitations Standing;Walking;House hold activities   How long can you sit comfortably? no problem    How long can you stand comfortably? able to stand for 15 seconds without touching something    How long can you walk comfortably? Able to walk for less than five minutes    Patient Stated Goals to improve her balance, to be able to walk with her dog             [facility] Assessment - 09/09/16 0001      Assessment   Medical Diagnosis unsteady gait   Referring Provider VikBess HarvestOnset Date/Surgical Date 05/05/16   Next MD Visit not scheduled   Prior Therapy not for this diagnosis      Precautions   Precautions None     Restrictions   Weight Bearing Restrictions No     Balance Screen   Has the patient fallen in the past 6 months Yes   How many times? 1   Has the patient had a decrease in activity level because of a fear of falling?  Yes   Is the patient reluctant to leave their home because of a fear of falling?  Yes     [place]     Prior Function   Level of Independence Independent with basic ADLs   Vocation On disability  Do housework a little at a time due to chronic back pain.    Leisure walking dog, taking care of her garden      Cognition   Overall Cognitive Status Within Functional Limits for tasks assessed     Observation/Other Assessments   Focus on Therapeutic Outcomes (FOTO)  --     Functional Tests   Functional tests --     Single Leg Stance    Comments --     Sit to Stand   Comments --     Strength   Overall Strength --            Vestibular Assessment - 09/09/16 0001      Positional Testing   Dix-Hallpike Dix-Hallpike Right;Dix-Hallpike Left     Dix-Hallpike Right  Dix-Hallpike Right Symptoms No nystagmus     Dix-Hallpike Left   Dix-Hallpike Left Symptoms Upbeat, right rotatory nystagmus     Sidelying Right   Sidelying Right Symptoms No nystagmus     Positional Sensitivities   Up from Right Hallpike Moderate dizziness   Up from Left Hallpike Moderate dizziness                  Vestibular Treatment/Exercise - 09/09/16 0001      Vestibular Treatment/Exercise   Vestibular Treatment Provided Canalith Repositioning   Canalith Repositioning Epley Manuever Right   Habituation Exercises [place]   Number of Reps  3   Symptom Description  moderate                PT Education - 09/09/16 1040    Education provided Yes   Education Details Habituation for sidelying to sit    Person(s) Educated Patient   Methods Explanation   Comprehension Verbalized understanding          PT Short Term Goals - 09/09/16 1222      PT SHORT TERM GOAL #1   Title Pt to be able to stand without UE assist for five minutes to allow completion of self grooming with improved safety   Time 2   Period Weeks   Status Partially Met  certain days yes others no     PT SHORT TERM GOAL #2   Title PT to state that her dizziness frequency and intensity have decreased by 25%  to reduce risk of falling    Time 2   Period Weeks   Status Achieved     PT SHORT TERM GOAL #3   Title Pt to be able to walk for 10 minutes without holding onto walls to reduce risk of falling.    Time 2   Period Weeks   Status On-going           PT Long Term Goals - 09/09/16 1222      PT LONG TERM GOAL #1   Title Pt to be able to stand without UE support for 15 minutes to be able to make a small meal  without risk of falling    Time 4   Period Weeks   Status On-going     PT LONG TERM GOAL #2   Title PT to have resumed driving    Time 4   Period Weeks   Status On-going     PT LONG TERM GOAL #3   Title Pt to be able to single leg stance for 20 seconds B for reduce risk of falling    Time 4   Period Weeks   Status Partially Met  Able to do so on right but not on left                Plan - 09/09/16 1043    Clinical Impression Statement Pt has not been seen since 3/15 due to having two eye surgeries.  She is positive with Hal-pike Dix to the left but not as severe as when she was first evaluated.  Pt continues to need skilled physical therapy for canalith repositioning and improving balance to reduce the risk of falls.    Rehab Potential Good   PT Frequency 2x / week   PT Duration 4 weeks   PT Treatment/Interventions ADLs/Self Care Home Management;Canalith Repostioning;Gait training;Stair training;Functional mobility training;Therapeutic activities;Therapeutic exercise;Balance training;Neuromuscular re-education;Patient/family education;Manual techniques   PT  Next Visit Plan Complete eply's remover if pt has a postitive [employer]. Begin static balance working up to dynamic balance activity to improve safety.       Patient will benefit from skilled therapeutic intervention in order to improve the following deficits and impairments:  Abnormal gait, Decreased activity tolerance, Decreased balance, Difficulty walking  Visit Diagnosis: Unsteadiness on feet  Difficulty in walking, not elsewhere classified     Problem List Patient Active Problem List   Diagnosis Date Noted  . Loss of weight 07/04/2015  . Osteoarthritis 12/27/2014  . Allergic dermatitis eyelid 12/27/2014  . Impetigo 04/05/2014  . Ganglion cyst of wrist 04/05/2014  . Abnormal CT scan, chest 01/12/2014  . Seborrheic dermatitis of scalp 04/12/2013  . Infected insect bite of left breast 11/17/2012  .  Polycythemia 09/08/2012  . Vertigo 01/22/2012  . Chronic pain 12/27/2011  . Dysphagia 03/14/2011  . DEPRESSION 04/24/2010  . Chronic bronchitis ([place]) 01/19/2010  . Dyspnea 08/04/2009  . Osteoporosis 11/11/2008  . BACK PAIN, LUMBAR, WITH RADICULOPATHY 07/28/2008  . TRANSAMINASES, SERUM, ELEVATED 03/18/2008  . GENITAL HERPES 09/29/2007  . 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 Hyperlipidemia 02/03/2007  . VARCIES, SITE [place] 09/30/2006  . ANXIETY 02/27/2006  . Essential hypertension 02/27/2006  . Allergic rhinitis 02/27/2006  . GERD 02/27/2006  . Crohn's disease of small intestine with complication [facility]) 21/58/7276    Rayetta Humphrey, [place] CLT [phone] 09/09/2016, 12:23 PM  [place] [address] [place], [place], [address] Phone: [phone]   Fax:  [phone]  Name: [name] [name] MRN: [id number] Date of Birth: 1955-10-31

## 2016-09-09 NOTE — Patient Instructions (Addendum)
er day.  Copyright  VHI. All rights reserved.  Getting Into / Out of Bed    Lower self to lie down on one side by raising legs and lowering head at the same time. Use arms to assist moving without twisting. Bend both knees to roll onto back if desired. To sit up, start from lying on side, and use same move-ments in reverse. Keep trunk aligned with legs. Repeat 5 times to each side.  Do 3 times a day   Copyright  VHI. All rights reserved.

## 2016-09-11 ENCOUNTER — Ambulatory Visit (HOSPITAL_COMMUNITY): Payer: Medicare Other

## 2016-09-11 DIAGNOSIS — R2681 Unsteadiness on feet: Secondary | ICD-10-CM | POA: Diagnosis not present

## 2016-09-11 DIAGNOSIS — R262 Difficulty in walking, not elsewhere classified: Secondary | ICD-10-CM

## 2016-09-11 NOTE — Therapy (Signed)
[facility] [address] [place], [place], [address] Phone: [phone]   Fax:  [phone]  Physical Therapy Treatment  Patient Details  Name: [name] [name] MRN: [id number] Date of Birth: 02/03/1956 Referring Provider: Bess Harvest  Encounter Date: 09/11/2016    Past Medical History:  Diagnosis Date  . Allergic rhinitis   . Anastomotic ulcer JAN 2009  . Anxiety   . Asthma   . BMI (body mass index) 20.0-29.9 2009 121 lbs  . 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 Crohn disease ([place])   . Crohn disease ([place])   . CTS (carpal tunnel syndrome)   . Diarrhea MULITIFACTORIAL   IBS, LACTOSE INTOLERANCE, SBBO, BILE-SALT  . Elevated liver enzymes 2009: BMI 24 ?Etoh ALT 94, AST 51,  NEG ANA, qIGs& ASMA   MAY 2012 AST 52 ALT 38  . Esophageal stricture 2009  . GERD (gastroesophageal reflux disease)   . Hemorrhoid   . Hyperlipemia   . Hypertension   . Inflammatory bowel disease 2006 CD   SURGICAL REMISSION  . LBP (low back pain)   . Migraine   . PONV (postoperative nausea and vomiting)   . Shortness of breath    exertion and humidity  . Sleep apnea    Stop [name]    Past Surgical History:  Procedure Laterality Date  . BACK SURGERY    . BILATERAL SALPINGOOPHORECTOMY    . BIOPSY  12/24/2011   Procedure: BIOPSY;  Surgeon: Danie Binder, MD;  Location: [facility];  Service: Endoscopy;;  Gastric Biopsies  . BIOPSY N/A 06/30/2012   Procedure: BIOPSY;  Surgeon: Danie Binder, MD;  Location: [facility];  Service: Endoscopy;  Laterality: N/A;  Gastric and Esophageal Biopsies  . CARPAL TUNNEL RELEASE  LEFT  . CATARACT EXTRACTION W/PHACO Right 07/30/2016   Procedure: CATARACT EXTRACTION PHACO AND INTRAOCULAR LENS PLACEMENT (IOC);  Surgeon: Rutherford Guys, MD;  Location: [facility];  Service: Ophthalmology;  Laterality: Right;  CDE: 5.45  . CATARACT EXTRACTION W/PHACO Left 08/13/2016   Procedure: CATARACT EXTRACTION PHACO AND INTRAOCULAR LENS  PLACEMENT (IOC);  Surgeon: Rutherford Guys, MD;  Location: [facility];  Service: Ophthalmology;  Laterality: Left;  CDE: 5.59  . COLON SURGERY    . COLONOSCOPY  JAN 2009 DIARRHEA, ABD PAIN, BRBPR   ANASTOMOTIC ULCER(3MM), IH VO:HYWVPX ULCER, NL COLON bX  . DILATION AND CURETTAGE OF UTERUS    . ESOPHAGEAL DILATION  12/24/2011   SLF: A stricture was found in the distal esophagus/ Moderate gastritis/ MILD Duodenitis  . ESOPHAGOGASTRODUODENOSCOPY  02/07/09   mild gastritis  . ESOPHAGOGASTRODUODENOSCOPY (EGD) WITH PROPOFOL N/A 06/30/2012   SLF: 1. No definite stricture appreciated 2. small hiatal hernia 3. Gastritis  . ESOPHAGOGASTRODUODENOSCOPY (EGD) WITH PROPOFOL N/A 02/20/2016   Procedure: ESOPHAGOGASTRODUODENOSCOPY (EGD) WITH PROPOFOL;  Surgeon: Danie Binder, MD;  [facility];  Service: Endoscopy;  Laterality: N/A;  930  . gum flap Bilateral   . HEMICOLECTOMY  RIGHT 2006  . HERNIA REPAIR    . LUMBAR DISC SURGERY    . SAVORY DILATION N/A 06/30/2012   Procedure: SAVORY DILATION;  Surgeon: Danie Binder, MD;  Location: [facility];  Service: Endoscopy;  Laterality: N/A;  12.39m , 125m 1523m. SAVORY DILATION N/A 02/20/2016   Procedure: SAVORY DILATION;  Surgeon: SanDanie BinderD;  [facility];  Service: Endoscopy;  Laterality: N/A;  . UPPER GASTROINTESTINAL ENDOSCOPY  JAN 2009 ABD  PAIN   GASTRITIS, ESO RING  . UPPER GASTROINTESTINAL ENDOSCOPY  OCT 2010 ABD PAIN, DYSPHAGIA   DIL 15MM, GASTRITIS, NL DUODENUM  . UPPER GASTROINTESTINAL ENDOSCOPY  OCT 2010 DYSPHAGIA   DIL 17 MM, GASTRITIS, NL DUODENUM  . vocal cord surgery  Sep 20, 2010   precancerous areas removed  . wisdom tooth extraction Right    pin placement due to broken jaw    There were no vitals filed for this visit.      Subjective Assessment - 09/11/16 0908    Subjective Pt reports she is doing well today. She has not been working on her HEP because she lost her handout.    Currently in Pain? No/denies                               Balance Exercises - 09/11/16 0910      Balance Exercises: Standing   Standing Eyes Opened Narrow base of support (BOS);Foam/compliant surface;30 secs;5 reps  normal stance on foam, horizontal head turns x10 bilat   Standing Eyes Closed Narrow base of support (BOS);Solid surface;5 reps;30 secs   Rebounder Double leg  narrow stance:  rebound over the doorframe x10    Stepping Strategy Anterior;10 reps  Step Up/Down/turnaround: 10x each direction   Retro Gait 1 rep  1x81f;   Sidestepping 2 reps  1x385fbilat             PT Short Term Goals - 09/09/16 1222      PT SHORT TERM GOAL #1   Title Pt to be able to stand without UE assist for five minutes to allow completion of self grooming with improved safety   Time 2   Period Weeks   Status Partially Met  certain days yes others no     PT SHORT TERM GOAL #2   Title PT to state that her dizziness frequency and intensity have decreased by 25%  to reduce risk of falling    Time 2   Period Weeks   Status Achieved     PT SHORT TERM GOAL #3   Title Pt to be able to walk for 10 minutes without holding onto walls to reduce risk of falling.    Time 2   Period Weeks   Status On-going           PT Long Term Goals - 09/09/16 1222      PT LONG TERM GOAL #1   Title Pt to be able to stand without UE support for 15 minutes to be able to make a small meal without risk of falling    Time 4   Period Weeks   Status On-going     PT LONG TERM GOAL #2   Title PT to have resumed driving    Time 4   Period Weeks   Status On-going     PT LONG TERM GOAL #3   Title Pt to be able to single leg stance for 20 seconds B for reduce risk of falling    Time 4   Period Weeks   Status Partially Met  Able to do so on right but not on left                Plan - 09/11/16 0932    Clinical Impression Statement Large focus on static and dynamic balance today with adn without funcitonal  acitivty. Pt dfeomnstrate improved motor control and recovery  withincreased frequency of exercise. Mild dizziness noted. Making progress toward goals overall.    Rehab Potential Good   PT Frequency 2x / week   PT Duration 4 weeks   PT Treatment/Interventions ADLs/Self Care Home Management;Canalith Repostioning;Gait training;Stair training;Functional mobility training;Therapeutic activities;Therapeutic exercise;Balance training;Neuromuscular re-education;Patient/family education;Manual techniques   PT Next Visit Plan Continue static balance working up to dynamic balance activity to improve safety.  Complete eply's remover if pt has a Nurse, adult.       Patient will benefit from skilled therapeutic intervention in order to improve the following deficits and impairments:  Abnormal gait, Decreased activity tolerance, Decreased balance, Difficulty walking  Visit Diagnosis: Unsteadiness on feet  Difficulty in walking, not elsewhere classified     Problem List Patient Active Problem List   Diagnosis Date Noted  . Loss of weight 07/04/2015  . Osteoarthritis 12/27/2014  . Allergic dermatitis eyelid 12/27/2014  . Impetigo 04/05/2014  . Ganglion cyst of wrist 04/05/2014  . Abnormal CT scan, chest 01/12/2014  . Seborrheic dermatitis of scalp 04/12/2013  . Infected insect bite of left breast 11/17/2012  . Polycythemia 09/08/2012  . Vertigo 01/22/2012  . Chronic pain 12/27/2011  . Dysphagia 03/14/2011  . DEPRESSION 04/24/2010  . Chronic bronchitis ([place]) 01/19/2010  . Dyspnea 08/04/2009  . Osteoporosis 11/11/2008  . BACK PAIN, LUMBAR, WITH RADICULOPATHY 07/28/2008  . TRANSAMINASES, SERUM, ELEVATED 03/18/2008  . GENITAL HERPES 09/29/2007  . 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 Hyperlipidemia 02/03/2007  . VARCIES, SITE [place] 09/30/2006  . ANXIETY 02/27/2006  . Essential hypertension 02/27/2006  . Allergic rhinitis 02/27/2006  . GERD 02/27/2006  . Crohn's  disease of small intestine with complication ([place]) 42/39/5320    9:47 AM, 09/11/16 Etta Grandchild, PT, DPT Physical Therapist at [place] [phone] (office)      Etta Grandchild 09/11/2016, 9:46 AM  [place], [place], [address] Phone: [phone]   Fax:  [phone]  Name: [name] [name] MRN: [id number] Date of Birth: 12/10/1955

## 2016-09-12 ENCOUNTER — Other Ambulatory Visit: Payer: Self-pay | Admitting: Family Medicine

## 2016-09-16 ENCOUNTER — Ambulatory Visit (HOSPITAL_COMMUNITY): Payer: Medicare Other | Admitting: Physical Therapy

## 2016-09-16 DIAGNOSIS — R2681 Unsteadiness on feet: Secondary | ICD-10-CM

## 2016-09-16 DIAGNOSIS — R262 Difficulty in walking, not elsewhere classified: Secondary | ICD-10-CM

## 2016-09-16 NOTE — Therapy (Signed)
[address] [place], [place], [address] Phone: [phone]   Fax:  [phone]  Physical Therapy Treatment  Patient Details  Name: [name] [name] MRN: [id number] Date of Birth: April 06, 1956 Referring Provider: Bess Harvest  Encounter Date: 09/16/2016      PT End of Session - 09/16/16 1439    Visit Number 9   Number of Visits 14   Date for PT Re-Evaluation 10/09/16   Authorization Type Medicare   Authorization - Visit Number 7   Authorization - Number of Visits 14   PT Start Time 4403   PT Stop Time 1435   PT Time Calculation (min) 40 min   Equipment Utilized During Treatment Gait belt   Activity Tolerance Patient tolerated treatment well   Behavior During Therapy [facility] for tasks assessed/performed      Past Medical History:  Diagnosis Date  . Allergic rhinitis   . Anastomotic ulcer JAN 2009  . Anxiety   . Asthma   . BMI (body mass index) 20.0-29.9 2009 121 lbs  . 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 Crohn disease ([place])   . Crohn disease ([place])   . CTS (carpal tunnel syndrome)   . Diarrhea MULITIFACTORIAL   IBS, LACTOSE INTOLERANCE, SBBO, BILE-SALT  . Elevated liver enzymes 2009: BMI 24 ?Etoh ALT 94, AST 51,  NEG ANA, qIGs& ASMA   MAY 2012 AST 52 ALT 38  . Esophageal stricture 2009  . GERD (gastroesophageal reflux disease)   . Hemorrhoid   . Hyperlipemia   . Hypertension   . Inflammatory bowel disease 2006 CD   SURGICAL REMISSION  . LBP (low back pain)   . Migraine   . PONV (postoperative nausea and vomiting)   . Shortness of breath    exertion and humidity  . Sleep apnea    Stop [name]    Past Surgical History:  Procedure Laterality Date  . BACK SURGERY    . BILATERAL SALPINGOOPHORECTOMY    . BIOPSY  12/24/2011   Procedure: BIOPSY;  Surgeon: Danie Binder, MD;  Location: [facility];  Service: Endoscopy;;  Gastric Biopsies  . BIOPSY N/A 06/30/2012   Procedure: BIOPSY;  Surgeon: Danie Binder, MD;  Location: [facility];  Service: Endoscopy;  Laterality: N/A;  Gastric and Esophageal Biopsies  . CARPAL TUNNEL RELEASE  LEFT  . CATARACT EXTRACTION W/PHACO Right 07/30/2016   Procedure: CATARACT EXTRACTION PHACO AND INTRAOCULAR LENS PLACEMENT (IOC);  Surgeon: Rutherford Guys, MD;  Location: [facility];  Service: Ophthalmology;  Laterality: Right;  CDE: 5.45  . CATARACT EXTRACTION W/PHACO Left 08/13/2016   Procedure: CATARACT EXTRACTION PHACO AND INTRAOCULAR LENS PLACEMENT (IOC);  Surgeon: Rutherford Guys, MD;  Location: [facility];  Service: Ophthalmology;  Laterality: Left;  CDE: 5.59  . COLON SURGERY    . COLONOSCOPY  JAN 2009 DIARRHEA, ABD PAIN, BRBPR   ANASTOMOTIC ULCER(3MM), IH [id number] ULCER, NL COLON bX  . DILATION AND CURETTAGE OF UTERUS    . ESOPHAGEAL DILATION  12/24/2011   SLF: A stricture was found in the distal esophagus/ Moderate gastritis/ MILD Duodenitis  . ESOPHAGOGASTRODUODENOSCOPY  02/07/09   mild gastritis  . ESOPHAGOGASTRODUODENOSCOPY (EGD) WITH PROPOFOL N/A 06/30/2012   SLF: 1. No definite stricture appreciated 2. small hiatal hernia 3. Gastritis  . ESOPHAGOGASTRODUODENOSCOPY (EGD) WITH PROPOFOL N/A 02/20/2016   Procedure: ESOPHAGOGASTRODUODENOSCOPY (EGD) WITH PROPOFOL;  Surgeon: Danie Binder, MD;  [facility];  Service: Endoscopy;  Laterality: N/A;  930  . gum flap Bilateral   . HEMICOLECTOMY  RIGHT 2006  . HERNIA REPAIR    . LUMBAR DISC SURGERY    . SAVORY DILATION N/A 06/30/2012   Procedure: SAVORY DILATION;  Surgeon: Danie Binder, MD;  Location: [facility];  Service: Endoscopy;  Laterality: N/A;  12.75m , 171m 1527m. SAVORY DILATION N/A 02/20/2016   Procedure: SAVORY DILATION;  Surgeon: SanDanie BinderD;  [facility];  Service: Endoscopy;  Laterality: N/A;  . UPPER GASTROINTESTINAL ENDOSCOPY  JAN 2009 ABD PAIN   GASTRITIS, ESO RING  . UPPER GASTROINTESTINAL ENDOSCOPY  OCT 2010 ABD PAIN, DYSPHAGIA   DIL 15MM, GASTRITIS, NL DUODENUM  . UPPER  GASTROINTESTINAL ENDOSCOPY  OCT 2010 DYSPHAGIA   DIL 17 MM, GASTRITIS, NL DUODENUM  . vocal cord surgery  Sep 20, 2010   precancerous areas removed  . wisdom tooth extraction Right    pin placement due to broken jaw    There were no vitals filed for this visit.      Subjective Assessment - 09/16/16 1357    Subjective Pt states she feels that her eye surgeries set her back.  She states that sometimes her hand just goes over the place.  She continues to periodically be dizzy she does better throughout the day.    Currently in Pain? No/denies                Vestibular Assessment - 09/16/16 0001      Vestibular Assessment   General Observation --  Pt has sunglassess      Symptom Behavior   Type of Dizziness Spinning   Frequency of Dizziness getting out of bed is the worst; sit to stand  was constant but no comes and goes.   always dizzy but has bouts where intensity is greater    Duration of Dizziness --  does not know for sure but about five minutes    Aggravating Factors Lying supine;Moving eyes;Turning body quickly;Supine to sit;Rolling to right;Forward bending     Occulomotor Exam   Occulomotor Alignment Normal   Smooth Pursuits Saccades   Saccades Intact     Vestibulo-Occular Reflex   VOR 1 Head Only (x 1 viewing) normal      Positional Testing   Dix-Hallpike Dix-Hallpike Right;Dix-Hallpike Left   Sidelying Test Sidelying Right;Sidelying Left   Horizontal Canal Testing Horizontal Canal Right;Horizontal Canal Left     Dix-Hallpike Right   Dix-Hallpike Right Duration Pt complains of significant  dizziness but has no nystagmus    Dix-Hallpike Right Symptoms Upbeat, right rotatory nystagmus     Dix-Hallpike Left   Dix-Hallpike Left Symptoms No nystagmus     Sidelying Right   Sidelying Right Duration 10   Sidelying Right Symptoms No nystagmus     Sidelying Left   Sidelying Left Symptoms No nystagmus     Horizontal Canal Right   Horizontal Canal Right  Duration negative   Horizontal Canal Right Symptoms Normal     Horizontal Canal Left   Horizontal Canal Left Duration negative    Horizontal Canal Left Symptoms Normal     Positional Sensitivities   Sit to Supine No dizziness   Supine to Left Side Slight dizziness   Supine to Sitting No dizziness                  Vestibular Treatment/Exercise - 09/16/16 0001      Vestibular Treatment/Exercise   Habituation Exercises BraLaruth Bouchard  Daroff;Horizontal Roll     Horizontal Roll   Number of Reps  5            Balance Exercises - 09/16/16 1436      Balance Exercises: Standing   SLS Eyes open;3 reps   Wall Bumps Shoulder;Hip   Wall Bumps-Shoulders Eyes opened;10 reps   Wall Bumps-Hips Eyes opened;10 reps   Tandem Gait Retro;2 reps   Retro Gait 1 rep   Sidestepping Foam/compliant support;2 reps   Marching Limitations 10   Heel Raises Limitations 10   Lift / Chop Limitations all 4 opposite arm/leg raise      gt with no assistive device trying to achieve normal arm swing.         PT Short Term Goals - 09/09/16 1222      PT SHORT TERM GOAL #1   Title Pt to be able to stand without UE assist for five minutes to allow completion of self grooming with improved safety   Time 2   Period Weeks   Status Partially Met  certain days yes others no     PT SHORT TERM GOAL #2   Title PT to state that her dizziness frequency and intensity have decreased by 25%  to reduce risk of falling    Time 2   Period Weeks   Status Achieved     PT SHORT TERM GOAL #3   Title Pt to be able to walk for 10 minutes without holding onto walls to reduce risk of falling.    Time 2   Period Weeks   Status On-going           PT Long Term Goals - 09/09/16 1222      PT LONG TERM GOAL #1   Title Pt to be able to stand without UE support for 15 minutes to be able to make a small meal without risk of falling    Time 4   Period Weeks   Status On-going     PT LONG TERM GOAL #2   Title  PT to have resumed driving    Time 4   Period Weeks   Status On-going     PT LONG TERM GOAL #3   Title Pt to be able to single leg stance for 20 seconds B for reduce risk of falling    Time 4   Period Weeks   Status Partially Met  Able to do so on right but not on left                Plan - 09/16/16 1442    Clinical Impression Statement Pt Halpike Dix manuevers are negative.  Slight positive left supine roll geotrophic .   Rehab Potential Good   PT Frequency 2x / week   PT Duration 4 weeks   PT Treatment/Interventions ADLs/Self Care Home Management;Canalith Repostioning;Gait training;Stair training;Functional mobility training;Therapeutic activities;Therapeutic exercise;Balance training;Neuromuscular re-education;Patient/family education;Manual techniques   PT Next Visit Plan Complete Gufoni manuever;  Lie on good side (rt) fot one minute then turn head down to ground 45 degree angle x 2 minutes prior to starting treatment.  Then work on dynamic balance       Patient will benefit from skilled therapeutic intervention in order to improve the following deficits and impairments:  Abnormal gait, Decreased activity tolerance, Decreased balance, Difficulty walking  Visit Diagnosis: Unsteadiness on feet  Difficulty in walking, not elsewhere classified     Problem List Patient Active Problem List  Diagnosis Date Noted  . Loss of weight 07/04/2015  . Osteoarthritis 12/27/2014  . Allergic dermatitis eyelid 12/27/2014  . Impetigo 04/05/2014  . Ganglion cyst of wrist 04/05/2014  . Abnormal CT scan, chest 01/12/2014  . Seborrheic dermatitis of scalp 04/12/2013  . Infected insect bite of left breast 11/17/2012  . Polycythemia 09/08/2012  . Vertigo 01/22/2012  . Chronic pain 12/27/2011  . Dysphagia 03/14/2011  . DEPRESSION 04/24/2010  . Chronic bronchitis ([place]) 01/19/2010  . Dyspnea 08/04/2009  . Osteoporosis 11/11/2008  . BACK PAIN, LUMBAR, WITH RADICULOPATHY 07/28/2008   . TRANSAMINASES, SERUM, ELEVATED 03/18/2008  . GENITAL HERPES 09/29/2007  . 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 Hyperlipidemia 02/03/2007  . VARCIES, SITE [place] 09/30/2006  . ANXIETY 02/27/2006  . Essential hypertension 02/27/2006  . Allergic rhinitis 02/27/2006  . GERD 02/27/2006  . Crohn's disease of small intestine with complication [facility]) 52/17/4715   Rayetta Humphrey, PT CLT [phone] 09/16/2016, 2:47 PM  [facility] [address] [place], [place], [address] Phone: [phone]   Fax:  [phone]  Name: [name] [name] MRN: [id number] Date of Birth: [birth year]

## 2016-09-20 ENCOUNTER — Ambulatory Visit (HOSPITAL_COMMUNITY): Payer: Medicare Other | Admitting: Physical Therapy

## 2016-09-20 DIAGNOSIS — R2681 Unsteadiness on feet: Secondary | ICD-10-CM | POA: Diagnosis not present

## 2016-09-20 DIAGNOSIS — R262 Difficulty in walking, not elsewhere classified: Secondary | ICD-10-CM | POA: Diagnosis not present

## 2016-09-20 NOTE — Therapy (Signed)
[place], [place], [address] Phone: [phone]   Fax:  [phone]  Physical Therapy Treatment  Patient Details  Name: [name] [name] MRN: [id number] Date of Birth: Sep 12, 1955 Referring Provider: Bess Harvest  Encounter Date: 09/20/2016      PT End of Session - 09/20/16 0940    Visit Number 10   Number of Visits 14   Date for PT Re-Evaluation 10/09/16   Authorization Type Medicare   Authorization - Visit Number 10   Authorization - Number of Visits 14   PT Start Time 0900   PT Stop Time 0940   PT Time Calculation (min) 40 min   Equipment Utilized During Treatment Gait belt   Activity Tolerance Patient tolerated treatment well   Behavior During Therapy [facility] for tasks assessed/performed      Past Medical History:  Diagnosis Date  . Allergic rhinitis   . Anastomotic ulcer JAN 2009  . Anxiety   . Asthma   . BMI (body mass index) 20.0-29.9 2009 121 lbs  . 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 Crohn disease ([place])   . Crohn disease ([place])   . CTS (carpal tunnel syndrome)   . Diarrhea MULITIFACTORIAL   IBS, LACTOSE INTOLERANCE, SBBO, BILE-SALT  . Elevated liver enzymes 2009: BMI 24 ?Etoh ALT 94, AST 51,  NEG ANA, qIGs& ASMA   MAY 2012 AST 52 ALT 38  . Esophageal stricture 2009  . GERD (gastroesophageal reflux disease)   . Hemorrhoid   . Hyperlipemia   . Hypertension   . Inflammatory bowel disease 2006 CD   SURGICAL REMISSION  . LBP (low back pain)   . Migraine   . PONV (postoperative nausea and vomiting)   . Shortness of breath    exertion and humidity  . Sleep apnea    Stop [name]    Past Surgical History:  Procedure Laterality Date  . BACK SURGERY    . BILATERAL SALPINGOOPHORECTOMY    . BIOPSY  12/24/2011   Procedure: BIOPSY;  Surgeon: Danie Binder, MD;  Location: [facility];  Service: Endoscopy;;  Gastric Biopsies  . BIOPSY N/A 06/30/2012   Procedure: BIOPSY;  Surgeon:  Danie Binder, MD;  Location: [facility];  Service: Endoscopy;  Laterality: N/A;  Gastric and Esophageal Biopsies  . CARPAL TUNNEL RELEASE  LEFT  . CATARACT EXTRACTION W/PHACO Right 07/30/2016   Procedure: CATARACT EXTRACTION PHACO AND INTRAOCULAR LENS PLACEMENT (IOC);  Surgeon: Rutherford Guys, MD;  Location: [facility];  Service: Ophthalmology;  Laterality: Right;  CDE: 5.45  . CATARACT EXTRACTION W/PHACO Left 08/13/2016   Procedure: CATARACT EXTRACTION PHACO AND INTRAOCULAR LENS PLACEMENT (IOC);  Surgeon: Rutherford Guys, MD;  Location: [facility];  Service: Ophthalmology;  Laterality: Left;  CDE: 5.59  . COLON SURGERY    . COLONOSCOPY  JAN 2009 DIARRHEA, ABD PAIN, BRBPR   ANASTOMOTIC ULCER(3MM), IH [id number] ULCER, NL COLON bX  . DILATION AND CURETTAGE OF UTERUS    . ESOPHAGEAL DILATION  12/24/2011   SLF: A stricture was found in the distal esophagus/ Moderate gastritis/ MILD Duodenitis  . ESOPHAGOGASTRODUODENOSCOPY  02/07/09   mild gastritis  . ESOPHAGOGASTRODUODENOSCOPY (EGD) WITH PROPOFOL N/A 06/30/2012   SLF: 1. No definite stricture appreciated 2. small hiatal hernia 3. Gastritis  . ESOPHAGOGASTRODUODENOSCOPY (EGD) WITH PROPOFOL N/A 02/20/2016   Procedure: ESOPHAGOGASTRODUODENOSCOPY (EGD) WITH PROPOFOL;  Surgeon: Danie Binder, MD;  [facility];  Service: Endoscopy;  Laterality: N/A;  930  . gum flap Bilateral   . HEMICOLECTOMY  RIGHT 2006  . HERNIA REPAIR    . LUMBAR DISC SURGERY    . SAVORY DILATION N/A 06/30/2012   Procedure: SAVORY DILATION;  Surgeon: Danie Binder, MD;  Location: [facility];  Service: Endoscopy;  Laterality: N/A;  12.18m , 159m 1552m. SAVORY DILATION N/A 02/20/2016   Procedure: SAVORY DILATION;  Surgeon: SanDanie BinderD;  [facility];  Service: Endoscopy;  Laterality: N/A;  . UPPER GASTROINTESTINAL ENDOSCOPY  JAN 2009 ABD PAIN   GASTRITIS, ESO RING  . UPPER GASTROINTESTINAL ENDOSCOPY  OCT 2010 ABD PAIN, DYSPHAGIA   DIL 15MM, GASTRITIS, NL DUODENUM  .  UPPER GASTROINTESTINAL ENDOSCOPY  OCT 2010 DYSPHAGIA   DIL 17 MM, GASTRITIS, NL DUODENUM  . vocal cord surgery  Sep 20, 2010   precancerous areas removed  . wisdom tooth extraction Right    pin placement due to broken jaw    There were no vitals filed for this visit.      Subjective Assessment - 09/20/16 0901    Subjective Pt states that she has been very dizzy today.  She is under a lot of stress with her father dying.     Limitations Standing;Walking;House hold activities   How long can you sit comfortably? no problem    How long can you stand comfortably? able to stand for 15 seconds without touching something    How long can you walk comfortably? Able to walk for less than five minutes    Patient Stated Goals to improve her balance, to be able to walk with her dog    Currently in Pain? No/denies                Vestibular Assessment - 09/20/16 0001      Dix-Hallpike Left   Dix-Hallpike Left Symptoms No nystagmus     Sidelying Right   Sidelying Right Symptoms No nystagmus     Sidelying Left   Sidelying Left Symptoms No nystagmus     Positional Sensitivities   Up from Right Hallpike Mild dizziness   Up from Left Hallpike Mild dizziness                      Balance Exercises - 09/20/16 0911      Balance Exercises: Standing   Standing Eyes Opened Narrow base of support (BOS);Head turns;Foam/compliant surface;2 reps;30 secs   Tandem Stance Eyes open;Foam/compliant surface;2 reps;30 secs   Gait with Head Turns --  3 minute gait:  pt ambulate at .877 m/s   Tandem Gait Forward;Foam/compliant surface;2 reps   Retro Gait Foam/compliant surface;1 rep   Sidestepping Foam/compliant support;2 reps   Heel Raises Limitations --  touch the floor x 10    Sit to Stand Time 10   Other Standing Exercises Gufoni manuever   Nustep hills x 5 minutes              PT Short Term Goals - 09/09/16 1222      PT SHORT TERM GOAL #1   Title Pt to be able to  stand without UE assist for five minutes to allow completion of self grooming with improved safety   Time 2   Period Weeks   Status Partially Met  certain days yes others no     PT SHORT TERM GOAL #2   Title PT to state that her dizziness frequency and intensity have decreased  by 25%  to reduce risk of falling    Time 2   Period Weeks   Status Achieved     PT SHORT TERM GOAL #3   Title Pt to be able to walk for 10 minutes without holding onto walls to reduce risk of falling.    Time 2   Period Weeks   Status On-going           PT Long Term Goals - 09/09/16 1222      PT LONG TERM GOAL #1   Title Pt to be able to stand without UE support for 15 minutes to be able to make a small meal without risk of falling    Time 4   Period Weeks   Status On-going     PT LONG TERM GOAL #2   Title PT to have resumed driving    Time 4   Period Weeks   Status On-going     PT LONG TERM GOAL #3   Title Pt to be able to single leg stance for 20 seconds B for reduce risk of falling    Time 4   Period Weeks   Status Partially Met  Able to do so on right but not on left                Plan - 2016/09/27 0941    Clinical Impression Statement completed [facility] as pt was complaining of increased dizziness.  Both Rt and LT was negative. Pt able to complete a 3 minutes walking test with no loss of balance; rate of speed was .877 m/s    Rehab Potential Good   PT Frequency 2x / week   PT Duration 4 weeks   PT Treatment/Interventions ADLs/Self Care Home Management;Canalith Repostioning;Gait training;Stair training;Functional mobility training;Therapeutic activities;Therapeutic exercise;Balance training;Neuromuscular re-education;Patient/family education;Manual techniques   PT Next Visit Plan Continue static balance working up to dynamic balance activity to improve safety.  Complete eply's remover if pt has a Nurse, adult.       Patient will benefit from skilled  therapeutic intervention in order to improve the following deficits and impairments:  Abnormal gait, Decreased activity tolerance, Decreased balance, Difficulty walking  Visit Diagnosis: Unsteadiness on feet  Difficulty in walking, not elsewhere classified       G-Codes - Sep 27, 2016 0942    Functional Assessment Tool Used (Outpatient Only) Clinical judgement:  Pt able to complete 3 minute walk test without holding onto walls and with no loss of balance    Functional Limitation Mobility: Walking and moving around   Mobility: Walking and Moving Around Current Status [phone]) At least 20 percent but less than 40 percent impaired, limited or restricted   Mobility: Walking and Moving Around Goal Status [phone]) At least 20 percent but less than 40 percent impaired, limited or restricted      Problem List Patient Active Problem List   Diagnosis Date Noted  . Loss of weight 07/04/2015  . Osteoarthritis 12/27/2014  . Allergic dermatitis eyelid 12/27/2014  . Impetigo 04/05/2014  . Ganglion cyst of wrist 04/05/2014  . Abnormal CT scan, chest 01/12/2014  . Seborrheic dermatitis of scalp 04/12/2013  . Infected insect bite of left breast 11/17/2012  . Polycythemia 09/08/2012  . Vertigo 01/22/2012  . Chronic pain 12/27/2011  . Dysphagia 03/14/2011  . DEPRESSION 04/24/2010  . Chronic bronchitis ([place]) 01/19/2010  . Dyspnea 08/04/2009  . Osteoporosis 11/11/2008  . BACK PAIN, LUMBAR, WITH RADICULOPATHY 07/28/2008  . TRANSAMINASES,  SERUM, ELEVATED 03/18/2008  . GENITAL HERPES 09/29/2007  . 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 Hyperlipidemia 02/03/2007  . VARCIES, SITE [place] 09/30/2006  . ANXIETY 02/27/2006  . Essential hypertension 02/27/2006  . Allergic rhinitis 02/27/2006  . GERD 02/27/2006  . Crohn's disease of small intestine with complication [facility]) 76/28/3151  Rayetta Humphrey, PT CLT [phone] 09/20/2016, 9:44 AM  [facility] [address] [place], [place], [address] Phone: [phone]   Fax:  [phone]  Name: REIGN DZIUBA MRN: [id number] Date of Birth: 1955-09-28

## 2016-09-23 ENCOUNTER — Ambulatory Visit (HOSPITAL_COMMUNITY): Payer: Medicare Other | Admitting: Physical Therapy

## 2016-09-23 DIAGNOSIS — R262 Difficulty in walking, not elsewhere classified: Secondary | ICD-10-CM

## 2016-09-23 DIAGNOSIS — R2681 Unsteadiness on feet: Secondary | ICD-10-CM

## 2016-09-23 NOTE — Therapy (Signed)
[name] [name] [address] [place], [place], [address] Phone: [phone]   Fax:  [phone]  Physical Therapy Treatment  Patient Details  Name: [name] [name] MRN: [id number] Date of Birth: Apr 04, 1956 Referring Provider: Bess Harvest  Encounter Date: 09/23/2016      PT End of Session - 09/23/16 1220    Visit Number 11   Number of Visits 14   Date for PT Re-Evaluation 10/09/16   Authorization Type Medicare   Authorization - Visit Number 11   Authorization - Number of Visits 14   PT Start Time 7858   PT Stop Time 1115  Pt left at 1125 after nustep    PT Time Calculation (min) 40 min   Equipment Utilized During Treatment Gait belt   Activity Tolerance Patient tolerated treatment well   Behavior During Therapy [facility] for tasks assessed/performed      Past Medical History:  Diagnosis Date  . Allergic rhinitis   . Anastomotic ulcer JAN 2009  . Anxiety   . Asthma   . BMI (body mass index) 20.0-29.9 2009 121 lbs  . 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 Crohn disease ([place])   . Crohn disease ([place])   . CTS (carpal tunnel syndrome)   . Diarrhea MULITIFACTORIAL   IBS, LACTOSE INTOLERANCE, SBBO, BILE-SALT  . Elevated liver enzymes 2009: BMI 24 ?Etoh ALT 94, AST 51,  NEG ANA, qIGs& ASMA   MAY 2012 AST 52 ALT 38  . Esophageal stricture 2009  . GERD (gastroesophageal reflux disease)   . Hemorrhoid   . Hyperlipemia   . Hypertension   . Inflammatory bowel disease 2006 CD   SURGICAL REMISSION  . LBP (low back pain)   . Migraine   . PONV (postoperative nausea and vomiting)   . Shortness of breath    exertion and humidity  . Sleep apnea    Stop [name]    Past Surgical History:  Procedure Laterality Date  . BACK SURGERY    . BILATERAL SALPINGOOPHORECTOMY    . BIOPSY  12/24/2011   Procedure: BIOPSY;  Surgeon: Danie Binder, MD;  Location: [facility];  Service: Endoscopy;;  Gastric Biopsies  . BIOPSY N/A 06/30/2012   Procedure: BIOPSY;  Surgeon: Danie Binder, MD;  Location: [facility];  Service: Endoscopy;  Laterality: N/A;  Gastric and Esophageal Biopsies  . CARPAL TUNNEL RELEASE  LEFT  . CATARACT EXTRACTION W/PHACO Right 07/30/2016   Procedure: CATARACT EXTRACTION PHACO AND INTRAOCULAR LENS PLACEMENT (IOC);  Surgeon: Rutherford Guys, MD;  Location: [facility];  Service: Ophthalmology;  Laterality: Right;  CDE: 5.45  . CATARACT EXTRACTION W/PHACO Left 08/13/2016   Procedure: CATARACT EXTRACTION PHACO AND INTRAOCULAR LENS PLACEMENT (IOC);  Surgeon: Rutherford Guys, MD;  Location: [facility];  Service: Ophthalmology;  Laterality: Left;  CDE: 5.59  . COLON SURGERY    . COLONOSCOPY  JAN 2009 DIARRHEA, ABD PAIN, BRBPR   ANASTOMOTIC ULCER(3MM), IH IF:OYDXAJ ULCER, NL COLON bX  . DILATION AND CURETTAGE OF UTERUS    . ESOPHAGEAL DILATION  12/24/2011   SLF: A stricture was found in the distal esophagus/ Moderate gastritis/ MILD Duodenitis  . ESOPHAGOGASTRODUODENOSCOPY  02/07/09   mild gastritis  . ESOPHAGOGASTRODUODENOSCOPY (EGD) WITH PROPOFOL N/A 06/30/2012   SLF: 1. No definite stricture appreciated 2. small hiatal hernia 3. Gastritis  . ESOPHAGOGASTRODUODENOSCOPY (EGD) WITH PROPOFOL N/A 02/20/2016   Procedure: ESOPHAGOGASTRODUODENOSCOPY (EGD) WITH PROPOFOL;  Surgeon: Danie Binder, MD;  [facility]  [facility];  Service: Endoscopy;  Laterality: N/A;  930  . gum flap Bilateral   . HEMICOLECTOMY  RIGHT 2006  . HERNIA REPAIR    . LUMBAR DISC SURGERY    . SAVORY DILATION N/A 06/30/2012   Procedure: SAVORY DILATION;  Surgeon: Danie Binder, MD;  [facility];  Service: Endoscopy;  Laterality: N/A;  12.21m , 139m 1577m. SAVORY DILATION N/A 02/20/2016   Procedure: SAVORY DILATION;  Surgeon: SanDanie BinderD;  [facility];  Service: Endoscopy;  Laterality: N/A;  . UPPER GASTROINTESTINAL ENDOSCOPY  JAN 2009 ABD PAIN   GASTRITIS, ESO RING  . UPPER GASTROINTESTINAL ENDOSCOPY  OCT 2010 ABD PAIN, DYSPHAGIA   DIL 15MM,  GASTRITIS, NL DUODENUM  . UPPER GASTROINTESTINAL ENDOSCOPY  OCT 2010 DYSPHAGIA   DIL 17 MM, GASTRITIS, NL DUODENUM  . vocal cord surgery  Sep 20, 2010   precancerous areas removed  . wisdom tooth extraction Right    pin placement due to broken jaw    There were no vitals filed for this visit.      Subjective Assessment - 09/23/16 1217    Subjective Pt has been working on her walking at home.  No falls   Limitations Standing;Walking;House hold activities   How long can you sit comfortably? no problem    How long can you stand comfortably? able to stand for 15 seconds without touching something    How long can you walk comfortably? Able to walk for less than five minutes    Patient Stated Goals to improve her balance, to be able to walk with her dog                               Balance Exercises - 09/23/16 1217      Balance Exercises: Standing   SLS with Vectors Solid surface;3 reps;15 secs   Wall Bumps Shoulder;Hip   Wall Bumps-Shoulders Eyes opened;10 reps   Wall Bumps-Hips Eyes opened;10 reps   Tandem Gait Forward;Foam/compliant surface;2 reps   Retro Gait Foam/compliant surface;1 rep   Sidestepping 2 reps  over hurdles    Heel Raises Limitations --  10 with bending down and touching the floor   Lift / Chop Limitations all 4 opposite arm/leg raise    Other Standing Exercises steps x3; ambulating with normal speed x [id number] Overall Comments --  end session nustep x 12' not counted towards time.              PT Short Term Goals - 09/09/16 1222      PT SHORT TERM GOAL #1   Title Pt to be able to stand without UE assist for five minutes to allow completion of self grooming with improved safety   Time 2   Period Weeks   Status Partially Met  certain days yes others no     PT SHORT TERM GOAL #2   Title PT to state that her dizziness frequency and intensity have decreased by 25%  to reduce risk of falling    Time 2   Period Weeks   Status  Achieved     PT SHORT TERM GOAL #3   Title Pt to be able to walk for 10 minutes without holding onto walls to reduce risk of falling.    Time 2   Period Weeks   Status On-going  PT Long Term Goals - 09/09/16 1222      PT LONG TERM GOAL #1   Title Pt to be able to stand without UE support for 15 minutes to be able to make a small meal without risk of falling    Time 4   Period Weeks   Status On-going     PT LONG TERM GOAL #2   Title PT to have resumed driving    Time 4   Period Weeks   Status On-going     PT LONG TERM GOAL #3   Title Pt to be able to single leg stance for 20 seconds B for reduce risk of falling    Time 4   Period Weeks   Status Partially Met  Able to do so on right but not on left                Plan - 09/23/16 1220    Clinical Impression Statement Pt has no nystagmus with testing.  Pt has not fallen and is now amublating without an assistive device although she will furniture walk at times . Pt tends to keep arms very tight against sides or flailing therapist explained to keep arms relaxed and moving naturally not all over the place as this will increase her instabilit.    Rehab Potential Good   PT Frequency 2x / week   PT Duration 4 weeks   PT Treatment/Interventions ADLs/Self Care Home Management;Canalith Repostioning;Gait training;Stair training;Functional mobility training;Therapeutic activities;Therapeutic exercise;Balance training;Neuromuscular re-education;Patient/family education;Manual techniques   PT Next Visit Plan begin walking with head turns .      Patient will benefit from skilled therapeutic intervention in order to improve the following deficits and impairments:  Abnormal gait, Decreased activity tolerance, Decreased balance, Difficulty walking  Visit Diagnosis: Unsteadiness on feet  Difficulty in walking, not elsewhere classified     Problem List Patient Active Problem List   Diagnosis Date Noted  . Loss of  weight 07/04/2015  . Osteoarthritis 12/27/2014  . Allergic dermatitis eyelid 12/27/2014  . Impetigo 04/05/2014  . Ganglion cyst of wrist 04/05/2014  . Abnormal CT scan, chest 01/12/2014  . Seborrheic dermatitis of scalp 04/12/2013  . Infected insect bite of left breast 11/17/2012  . Polycythemia 09/08/2012  . Vertigo 01/22/2012  . Chronic pain 12/27/2011  . Dysphagia 03/14/2011  . DEPRESSION 04/24/2010  . Chronic bronchitis ([place]) 01/19/2010  . Dyspnea 08/04/2009  . Osteoporosis 11/11/2008  . BACK PAIN, LUMBAR, WITH RADICULOPATHY 07/28/2008  . TRANSAMINASES, SERUM, ELEVATED 03/18/2008  . GENITAL HERPES 09/29/2007  . 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 Hyperlipidemia 02/03/2007  . VARCIES, SITE [place] 09/30/2006  . ANXIETY 02/27/2006  . Essential hypertension 02/27/2006  . Allergic rhinitis 02/27/2006  . GERD 02/27/2006  . Crohn's disease of small intestine with complication [facility]) 62/94/7654   Rayetta Humphrey, PT CLT [phone] 09/23/2016, 12:25 PM  [facility] [address] [place], [place], [address] Phone: [phone]   Fax:  [phone]  Name: DESERI LOSS MRN: [id number] Date of Birth: 07/25/1955

## 2016-09-26 ENCOUNTER — Ambulatory Visit (HOSPITAL_COMMUNITY): Payer: Medicare Other | Admitting: Physical Therapy

## 2016-09-26 DIAGNOSIS — R262 Difficulty in walking, not elsewhere classified: Secondary | ICD-10-CM

## 2016-09-26 DIAGNOSIS — R2681 Unsteadiness on feet: Secondary | ICD-10-CM

## 2016-09-26 NOTE — Therapy (Signed)
[place], [place], [address] Phone: [phone]   Fax:  [phone]  Physical Therapy Treatment  Patient Details  Name: [name] [name] MRN: [id number] Date of Birth: 07-09-1955 Referring Provider: Bess Harvest  Encounter Date: 09/26/2016      PT End of Session - 09/26/16 0948    Visit Number 12   Number of Visits 14   Date for PT Re-Evaluation 10/09/16   Authorization Type Medicare   Authorization - Visit Number 12   Authorization - Number of Visits 14   PT Start Time 0907   PT Stop Time 0948   PT Time Calculation (min) 41 min   Equipment Utilized During Treatment Gait belt   Activity Tolerance Patient tolerated treatment well   Behavior During Therapy [facility] for tasks assessed/performed      Past Medical History:  Diagnosis Date  . Allergic rhinitis   . Anastomotic ulcer JAN 2009  . Anxiety   . Asthma   . BMI (body mass index) 20.0-29.9 2009 121 lbs  . 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 Crohn disease ([place])   . Crohn disease ([place])   . CTS (carpal tunnel syndrome)   . Diarrhea MULITIFACTORIAL   IBS, LACTOSE INTOLERANCE, SBBO, BILE-SALT  . Elevated liver enzymes 2009: BMI 24 ?Etoh ALT 94, AST 51,  NEG ANA, qIGs& ASMA   MAY 2012 AST 52 ALT 38  . Esophageal stricture 2009  . GERD (gastroesophageal reflux disease)   . Hemorrhoid   . Hyperlipemia   . Hypertension   . Inflammatory bowel disease 2006 CD   SURGICAL REMISSION  . LBP (low back pain)   . Migraine   . PONV (postoperative nausea and vomiting)   . Shortness of breath    exertion and humidity  . Sleep apnea    Stop [name]    Past Surgical History:  Procedure Laterality Date  . BACK SURGERY    . BILATERAL SALPINGOOPHORECTOMY    . BIOPSY  12/24/2011   Procedure: BIOPSY;  Surgeon: Danie Binder, MD;  Location[facility];  Service: Endoscopy;;  Gastric Biopsies  . BIOPSY N/A 06/30/2012   Procedure: BIOPSY;  Surgeon:  Danie Binder, MD;  Location: [facility];  Service: Endoscopy;  Laterality: N/A;  Gastric and Esophageal Biopsies  . CARPAL TUNNEL RELEASE  LEFT  . CATARACT EXTRACTION W/PHACO Right 07/30/2016   Procedure: CATARACT EXTRACTION PHACO AND INTRAOCULAR LENS PLACEMENT (IOC);  Surgeon: Rutherford Guys, MD;  Location: [facility];  Service: Ophthalmology;  Laterality: Right;  CDE: 5.45  . CATARACT EXTRACTION W/PHACO Left 08/13/2016   Procedure: CATARACT EXTRACTION PHACO AND INTRAOCULAR LENS PLACEMENT (IOC);  Surgeon: Rutherford Guys, MD;  Location: [facility];  Service: Ophthalmology;  Laterality: Left;  CDE: 5.59  . COLON SURGERY    . COLONOSCOPY  JAN 2009 DIARRHEA, ABD PAIN, BRBPR   ANASTOMOTIC ULCER(3MM), IH [id number]:AYTKZS ULCER, NL COLON bX  . DILATION AND CURETTAGE OF UTERUS    . ESOPHAGEAL DILATION  12/24/2011   SLF: A stricture was found in the distal esophagus/ Moderate gastritis/ MILD Duodenitis  . ESOPHAGOGASTRODUODENOSCOPY  02/07/09   mild gastritis  . ESOPHAGOGASTRODUODENOSCOPY (EGD) WITH PROPOFOL N/A 06/30/2012   SLF: 1. No definite stricture appreciated 2. small hiatal hernia 3. Gastritis  . ESOPHAGOGASTRODUODENOSCOPY (EGD) WITH PROPOFOL N/A 02/20/2016   Procedure: ESOPHAGOGASTRODUODENOSCOPY (EGD) WITH PROPOFOL;  Surgeon: Danie Binder, MD;  [facility];  Service: Endoscopy;  Laterality: N/A;  930  . gum flap Bilateral   . HEMICOLECTOMY  RIGHT 2006  . HERNIA REPAIR    . LUMBAR DISC SURGERY    . SAVORY DILATION N/A 06/30/2012   Procedure: SAVORY DILATION;  Surgeon: Danie Binder, MD;  Location: [facility];  Service: Endoscopy;  Laterality: N/A;  12.92m , 140m 1585m. SAVORY DILATION N/A 02/20/2016   Procedure: SAVORY DILATION;  Surgeon: SanDanie BinderD;  [facility];  Service: Endoscopy;  Laterality: N/A;  . UPPER GASTROINTESTINAL ENDOSCOPY  JAN 2009 ABD PAIN   GASTRITIS, ESO RING  . UPPER GASTROINTESTINAL ENDOSCOPY  OCT 2010 ABD PAIN, DYSPHAGIA   DIL 15MM, GASTRITIS, NL DUODENUM  .  UPPER GASTROINTESTINAL ENDOSCOPY  OCT 2010 DYSPHAGIA   DIL 17 MM, GASTRITIS, NL DUODENUM  . vocal cord surgery  Sep 20, 2010   precancerous areas removed  . wisdom tooth extraction Right    pin placement due to broken jaw    There were no vitals filed for this visit.      Subjective Assessment - 09/26/16 0924    Subjective Pt had a surprise visit from her family.   Limitations Standing;Walking;House hold activities   How long can you sit comfortably? no problem    How long can you stand comfortably? able to stand for 15 seconds without touching something    How long can you walk comfortably? Able to walk for less than five minutes    Patient Stated Goals to improve her balance, to be able to walk with her dog                               Balance Exercises - 09/26/16 0931      Balance Exercises: Standing   SLS with Vectors Solid surface;3 reps;15 secs   Rockerboard Anterior/posterior;Lateral;Other time (comment)  1'    Marching Limitations 10   Heel Raises Limitations --  10 with bending down and touching the floor   Toe Raise Limitations --  cone taps x 3; Warrior I x 2 reps x 30seconds    Sit to Stand Time 10   Lift / Chop Limitations all 4 opposite arm/leg raise    Other Standing Exercises obstacle course/ balance beam, over hurdles /weave cones/ head turns/ speed varied x 3 ([id number] all)   Overall Comments --  nustep done for 10' after treatment time.  Time not counted              PT Short Term Goals - 09/09/16 1222      PT SHORT TERM GOAL #1   Title Pt to be able to stand without UE assist for five minutes to allow completion of self grooming with improved safety   Time 2   Period Weeks   Status Partially Met  certain days yes others no     PT SHORT TERM GOAL #2   Title PT to state that her dizziness frequency and intensity have decreased by 25%  to reduce risk of falling    Time 2   Period Weeks   Status Achieved     PT SHORT  TERM GOAL #3   Title Pt to be able to walk for 10 minutes without holding onto walls to reduce risk of falling.    Time 2   Period Weeks   Status On-going  PT Long Term Goals - 09/09/16 1222      PT LONG TERM GOAL #1   Title Pt to be able to stand without UE support for 15 minutes to be able to make a small meal without risk of falling    Time 4   Period Weeks   Status On-going     PT LONG TERM GOAL #2   Title PT to have resumed driving    Time 4   Period Weeks   Status On-going     PT LONG TERM GOAL #3   Title Pt to be able to single leg stance for 20 seconds B for reduce risk of falling    Time 4   Period Weeks   Status Partially Met  Able to do so on right but not on left                Plan - 09/26/16 0949    Clinical Impression Statement Advanced balance activity with marching, warrior and obstacle course.  Pt needs min-guard assist but is improving in balance activity.    Rehab Potential Good   PT Frequency 2x / week   PT Duration 4 weeks   PT Treatment/Interventions ADLs/Self Care Home Management;Canalith Repostioning;Gait training;Stair training;Functional mobility training;Therapeutic activities;Therapeutic exercise;Balance training;Neuromuscular re-education;Patient/family education;Manual techniques   PT Next Visit Plan begin cone rotation on foam.      Patient will benefit from skilled therapeutic intervention in order to improve the following deficits and impairments:  Abnormal gait, Decreased activity tolerance, Decreased balance, Difficulty walking  Visit Diagnosis: Unsteadiness on feet  Difficulty in walking, not elsewhere classified     Problem List Patient Active Problem List   Diagnosis Date Noted  . Loss of weight 07/04/2015  . Osteoarthritis 12/27/2014  . Allergic dermatitis eyelid 12/27/2014  . Impetigo 04/05/2014  . Ganglion cyst of wrist 04/05/2014  . Abnormal CT scan, chest 01/12/2014  . Seborrheic dermatitis of  scalp 04/12/2013  . Infected insect bite of left breast 11/17/2012  . Polycythemia 09/08/2012  . Vertigo 01/22/2012  . Chronic pain 12/27/2011  . Dysphagia 03/14/2011  . DEPRESSION 04/24/2010  . Chronic bronchitis ([place]) 01/19/2010  . Dyspnea 08/04/2009  . Osteoporosis 11/11/2008  . BACK PAIN, LUMBAR, WITH RADICULOPATHY 07/28/2008  . TRANSAMINASES, SERUM, ELEVATED 03/18/2008  . GENITAL HERPES 09/29/2007  . 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 Hyperlipidemia 02/03/2007  . VARCIES, SITE [place] 09/30/2006  . ANXIETY 02/27/2006  . Essential hypertension 02/27/2006  . Allergic rhinitis 02/27/2006  . GERD 02/27/2006  . Crohn's disease of small intestine with complication [facility]) 41/96/2229   Rayetta Humphrey, PT CLT [phone] 09/26/2016, 9:50 AM  [facility] [address] [place], [place], [address] Phone: [phone]   Fax:  [phone]  Name: KLANI CARIDI MRN: [id number] Date of Birth: July 29, 1955

## 2016-10-01 ENCOUNTER — Ambulatory Visit (HOSPITAL_COMMUNITY): Payer: Medicare Other | Admitting: Physical Therapy

## 2016-10-01 DIAGNOSIS — R262 Difficulty in walking, not elsewhere classified: Secondary | ICD-10-CM

## 2016-10-01 DIAGNOSIS — R2681 Unsteadiness on feet: Secondary | ICD-10-CM | POA: Diagnosis not present

## 2016-10-01 NOTE — Therapy (Signed)
[place], [place], [address] Phone: [phone]   Fax:  [phone]  Physical Therapy Treatment  Patient Details  Name: [name] [name] MRN: [id number] Date of Birth: [birth year] Referring Provider: Bess Harvest  Encounter Date: 10/01/2016      PT End of Session - 10/01/16 0944    Visit Number 12   Number of Visits 14   Date for PT Re-Evaluation 10/09/16   Authorization Type Medicare   Authorization - Visit Number 12   Authorization - Number of Visits 14   PT Start Time 0905   PT Stop Time 0947   PT Time Calculation (min) 42 min   Equipment Utilized During Treatment Gait belt   Activity Tolerance Patient tolerated treatment well   Behavior During Therapy [facility] for tasks assessed/performed      Past Medical History:  Diagnosis Date  . Allergic rhinitis   . Anastomotic ulcer JAN 2009  . Anxiety   . Asthma   . BMI (body mass index) 20.0-29.9 2009 121 lbs  . 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 Crohn disease ([place])   . Crohn disease ([place])   . CTS (carpal tunnel syndrome)   . Diarrhea MULITIFACTORIAL   IBS, LACTOSE INTOLERANCE, SBBO, BILE-SALT  . Elevated liver enzymes 2009: BMI 24 ?Etoh ALT 94, AST 51,  NEG ANA, qIGs& ASMA   MAY 2012 AST 52 ALT 38  . Esophageal stricture 2009  . GERD (gastroesophageal reflux disease)   . Hemorrhoid   . Hyperlipemia   . Hypertension   . Inflammatory bowel disease 2006 CD   SURGICAL REMISSION  . LBP (low back pain)   . Migraine   . PONV (postoperative nausea and vomiting)   . Shortness of breath    exertion and humidity  . Sleep apnea    Stop [name]    Past Surgical History:  Procedure Laterality Date  . BACK SURGERY    . BILATERAL SALPINGOOPHORECTOMY    . BIOPSY  12/24/2011   Procedure: BIOPSY;  Surgeon: Danie Binder, MD;  Location: [facility];  Service: Endoscopy;;  Gastric Biopsies  . BIOPSY N/A 06/30/2012   Procedure: BIOPSY;  Surgeon:  Danie Binder, MD;  Location: [facility];  Service: Endoscopy;  Laterality: N/A;  Gastric and Esophageal Biopsies  . CARPAL TUNNEL RELEASE  LEFT  . CATARACT EXTRACTION W/PHACO Right 07/30/2016   Procedure: CATARACT EXTRACTION PHACO AND INTRAOCULAR LENS PLACEMENT (IOC);  Surgeon: Rutherford Guys, MD;  Location: [facility];  Service: Ophthalmology;  Laterality: Right;  CDE: 5.45  . CATARACT EXTRACTION W/PHACO Left 08/13/2016   Procedure: CATARACT EXTRACTION PHACO AND INTRAOCULAR LENS PLACEMENT (IOC);  Surgeon: Rutherford Guys, MD;  Location: [facility];  Service: Ophthalmology;  Laterality: Left;  CDE: 5.59  . COLON SURGERY    . COLONOSCOPY  JAN 2009 DIARRHEA, ABD PAIN, BRBPR   ANASTOMOTIC ULCER(3MM), IH FX:[id number] ULCER, NL COLON bX  . DILATION AND CURETTAGE OF UTERUS    . ESOPHAGEAL DILATION  12/24/2011   SLF: A stricture was found in the distal esophagus/ Moderate gastritis/ MILD Duodenitis  . ESOPHAGOGASTRODUODENOSCOPY  02/07/09   mild gastritis  . ESOPHAGOGASTRODUODENOSCOPY (EGD) WITH PROPOFOL N/A 06/30/2012   SLF: 1. No definite stricture appreciated 2. small hiatal hernia 3. Gastritis  . ESOPHAGOGASTRODUODENOSCOPY (EGD) WITH PROPOFOL N/A 02/20/2016   Procedure: ESOPHAGOGASTRODUODENOSCOPY (EGD) WITH PROPOFOL;  Surgeon: Danie Binder, MD;  [facility];  Service: Endoscopy;  Laterality: N/A;  930  . gum flap Bilateral   . HEMICOLECTOMY  RIGHT 2006  . HERNIA REPAIR    . LUMBAR DISC SURGERY    . SAVORY DILATION N/A 06/30/2012   Procedure: SAVORY DILATION;  Surgeon: Danie Binder, MD;  Location: [facility];  Service: Endoscopy;  Laterality: N/A;  12.52m , 133m 1570m. SAVORY DILATION N/A 02/20/2016   Procedure: SAVORY DILATION;  Surgeon: SanDanie BinderD;  [facility];  Service: Endoscopy;  Laterality: N/A;  . UPPER GASTROINTESTINAL ENDOSCOPY  JAN 2009 ABD PAIN   GASTRITIS, ESO RING  . UPPER GASTROINTESTINAL ENDOSCOPY  OCT 2010 ABD PAIN, DYSPHAGIA   DIL 15MM, GASTRITIS, NL DUODENUM  .  UPPER GASTROINTESTINAL ENDOSCOPY  OCT 2010 DYSPHAGIA   DIL 17 MM, GASTRITIS, NL DUODENUM  . vocal cord surgery  Sep 20, 2010   precancerous areas removed  . wisdom tooth extraction Right    pin placement due to broken jaw    There were no vitals filed for this visit.      Subjective Assessment - 10/01/16 0918    Subjective Pt feels that she is getting better.   Limitations Standing;Walking;House hold activities   How long can you sit comfortably? no problem    How long can you stand comfortably? able to stand for 15 seconds without touching something    How long can you walk comfortably? Able to walk for less than five minutes    Patient Stated Goals to improve her balance, to be able to walk with her dog    Currently in Pain? No/denies                              Balance Exercises - 10/01/16 0919      Balance Exercises: Standing   SLS with Vectors Solid surface;3 reps;15 secs   Tandem Gait Forward;Foam/compliant surface;2 reps   Retro Gait Foam/compliant surface;1 rep   Sidestepping Foam/compliant support;2 reps   Cone Rotation Foam/compliant surface;Right turn;Left turn   Marching Limitations 10   Lift / Chop Limitations lunge walking x 50 ft    Other Standing Exercises Big and loud sequence;  Forward lunge, side cross over to left and right and squat x 5    Overall Comments --  nustep x 15 min hills level 3 no charge done at end of sessi             PT Short Term Goals - 09/09/16 1222      PT SHORT TERM GOAL #1   Title Pt to be able to stand without UE assist for five minutes to allow completion of self grooming with improved safety   Time 2   Period Weeks   Status Partially Met  certain days yes others no     PT SHORT TERM GOAL #2   Title PT to state that her dizziness frequency and intensity have decreased by 25%  to reduce risk of falling    Time 2   Period Weeks   Status Achieved     PT SHORT TERM GOAL #3   Title Pt to be able  to walk for 10 minutes without holding onto walls to reduce risk of falling.    Time 2   Period Weeks   Status On-going           PT Long Term Goals - 09/09/16 1222      PT  LONG TERM GOAL #1   Title Pt to be able to stand without UE support for 15 minutes to be able to make a small meal without risk of falling    Time 4   Period Weeks   Status On-going     PT LONG TERM GOAL #2   Title PT to have resumed driving    Time 4   Period Weeks   Status On-going     PT LONG TERM GOAL #3   Title Pt to be able to single leg stance for 20 seconds B for reduce risk of falling    Time 4   Period Weeks   Status Partially Met  Able to do so on right but not on left                Plan - 10/01/16 0947    Clinical Impression Statement Pt continues to improve.  Pt able to complete cone rotation on foam without loss of balance.  PT is working on [employer] on own.    Rehab Potential Good   PT Frequency 2x / week   PT Duration 4 weeks   PT Treatment/Interventions ADLs/Self Care Home Management;Canalith Repostioning;Gait training;Stair training;Functional mobility training;Therapeutic activities;Therapeutic exercise;Balance training;Neuromuscular re-education;Patient/family education;Manual techniques   PT Next Visit Plan re assess with probable discharge.      Patient will benefit from skilled therapeutic intervention in order to improve the following deficits and impairments:  Abnormal gait, Decreased activity tolerance, Decreased balance, Difficulty walking  Visit Diagnosis: Unsteadiness on feet  Difficulty in walking, not elsewhere classified     Problem List Patient Active Problem List   Diagnosis Date Noted  . Loss of weight 07/04/2015  . Osteoarthritis 12/27/2014  . Allergic dermatitis eyelid 12/27/2014  . Impetigo 04/05/2014  . Ganglion cyst of wrist 04/05/2014  . Abnormal CT scan, chest 01/12/2014  . Seborrheic dermatitis of scalp 04/12/2013  . Infected insect bite  of left breast 11/17/2012  . Polycythemia 09/08/2012  . Vertigo 01/22/2012  . Chronic pain 12/27/2011  . Dysphagia 03/14/2011  . DEPRESSION 04/24/2010  . Chronic bronchitis ([place]) 01/19/2010  . Dyspnea 08/04/2009  . Osteoporosis 11/11/2008  . BACK PAIN, LUMBAR, WITH RADICULOPATHY 07/28/2008  . TRANSAMINASES, SERUM, ELEVATED 03/18/2008  . GENITAL HERPES 09/29/2007  . 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 Hyperlipidemia 02/03/2007  . VARCIES, SITE [place] 09/30/2006  . ANXIETY 02/27/2006  . Essential hypertension 02/27/2006  . Allergic rhinitis 02/27/2006  . GERD 02/27/2006  . Crohn's disease of small intestine with complication [facility]) 42/87/6811    Rayetta Humphrey, PT CLT [phone] 10/01/2016, 9:49 AM  [place] [address] [place], [place], [address] Phone: [phone]   Fax:  [phone]  Name: [name] [name] MRN: [id number] Date of Birth: Apr 21, 1956

## 2016-10-03 ENCOUNTER — Ambulatory Visit (HOSPITAL_COMMUNITY): Payer: Medicare Other | Admitting: Physical Therapy

## 2016-10-03 DIAGNOSIS — J449 Chronic obstructive pulmonary disease, unspecified: Secondary | ICD-10-CM | POA: Diagnosis not present

## 2016-10-03 DIAGNOSIS — K589 Irritable bowel syndrome without diarrhea: Secondary | ICD-10-CM | POA: Diagnosis not present

## 2016-10-03 DIAGNOSIS — K50912 Crohn's disease, unspecified, with intestinal obstruction: Secondary | ICD-10-CM | POA: Diagnosis not present

## 2016-10-03 DIAGNOSIS — R1084 Generalized abdominal pain: Secondary | ICD-10-CM | POA: Diagnosis not present

## 2016-10-03 DIAGNOSIS — Z79891 Long term (current) use of opiate analgesic: Secondary | ICD-10-CM | POA: Diagnosis not present

## 2016-10-03 DIAGNOSIS — M79673 Pain in unspecified foot: Secondary | ICD-10-CM | POA: Diagnosis not present

## 2016-10-03 DIAGNOSIS — F0781 Postconcussional syndrome: Secondary | ICD-10-CM | POA: Diagnosis not present

## 2016-10-03 DIAGNOSIS — M542 Cervicalgia: Secondary | ICD-10-CM | POA: Diagnosis not present

## 2016-10-03 DIAGNOSIS — M545 Low back pain: Secondary | ICD-10-CM | POA: Diagnosis not present

## 2016-10-04 ENCOUNTER — Ambulatory Visit (HOSPITAL_COMMUNITY): Payer: Medicare Other | Attending: Diagnostic Neuroimaging | Admitting: Physical Therapy

## 2016-10-04 DIAGNOSIS — R262 Difficulty in walking, not elsewhere classified: Secondary | ICD-10-CM | POA: Diagnosis not present

## 2016-10-04 DIAGNOSIS — R2681 Unsteadiness on feet: Secondary | ICD-10-CM

## 2016-10-04 NOTE — Therapy (Signed)
[place], [place], [address] Phone: [phone]   Fax:  [phone]  Physical Therapy Treatment  Patient Details  Name: [name] [name] MRN: [id number] Date of Birth: May 26, 1955 Referring Provider: Bess Harvest   Encounter Date: 10/04/2016      PT End of Session - 10/04/16 1107    Visit Number 13   Number of Visits 13   Date for PT Re-Evaluation 10/09/16   Authorization Type Medicare   Authorization - Visit Number 13   Authorization - Number of Visits 13   PT Start Time 9030   PT Stop Time 1116   PT Time Calculation (min) 38 min   Equipment Utilized During Treatment Gait belt   Activity Tolerance Patient tolerated treatment well   Behavior During Therapy WFL for tasks assessed/performed      Past Medical History:  Diagnosis Date  . Allergic rhinitis   . Anastomotic ulcer JAN 2009  . Anxiety   . Asthma   . BMI (body mass index) 20.0-29.9 2009 121 lbs  . 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 Crohn disease ([place])   . Crohn disease ([place])   . CTS (carpal tunnel syndrome)   . Diarrhea MULITIFACTORIAL   IBS, LACTOSE INTOLERANCE, SBBO, BILE-SALT  . Elevated liver enzymes 2009: BMI 24 ?Etoh ALT 94, AST 51,  NEG ANA, qIGs& ASMA   MAY 2012 AST 52 ALT 38  . Esophageal stricture 2009  . GERD (gastroesophageal reflux disease)   . Hemorrhoid   . Hyperlipemia   . Hypertension   . Inflammatory bowel disease 2006 CD   SURGICAL REMISSION  . LBP (low back pain)   . Migraine   . PONV (postoperative nausea and vomiting)   . Shortness of breath    exertion and humidity  . Sleep apnea    Stop [name]    Past Surgical History:  Procedure Laterality Date  . BACK SURGERY    . BILATERAL SALPINGOOPHORECTOMY    . BIOPSY  12/24/2011   Procedure: BIOPSY;  Surgeon: Danie Binder, MD;  Location: [facility];  Service: Endoscopy;;  Gastric Biopsies  . BIOPSY N/A 06/30/2012   Procedure: BIOPSY;  Surgeon:  Danie Binder, MD;  Location: [facility];  Service: Endoscopy;  Laterality: N/A;  Gastric and Esophageal Biopsies  . CARPAL TUNNEL RELEASE  LEFT  . CATARACT EXTRACTION W/PHACO Right 07/30/2016   Procedure: CATARACT EXTRACTION PHACO AND INTRAOCULAR LENS PLACEMENT (IOC);  Surgeon: Rutherford Guys, MD;  Location: [facility];  Service: Ophthalmology;  Laterality: Right;  CDE: 5.45  . CATARACT EXTRACTION W/PHACO Left 08/13/2016   Procedure: CATARACT EXTRACTION PHACO AND INTRAOCULAR LENS PLACEMENT (IOC);  Surgeon: Rutherford Guys, MD;  Location: [facility];  Service: Ophthalmology;  Laterality: Left;  CDE: 5.59  . COLON SURGERY    . COLONOSCOPY  JAN 2009 DIARRHEA, ABD PAIN, BRBPR   ANASTOMOTIC ULCER(3MM), IH SP:[id number] ULCER, NL COLON bX  . DILATION AND CURETTAGE OF UTERUS    . ESOPHAGEAL DILATION  12/24/2011   SLF: A stricture was found in the distal esophagus/ Moderate gastritis/ MILD Duodenitis  . ESOPHAGOGASTRODUODENOSCOPY  02/07/09   mild gastritis  . ESOPHAGOGASTRODUODENOSCOPY (EGD) WITH PROPOFOL N/A 06/30/2012   SLF: 1. No definite stricture appreciated 2. small hiatal hernia 3. Gastritis  . ESOPHAGOGASTRODUODENOSCOPY (EGD) WITH PROPOFOL N/A 02/20/2016   Procedure: ESOPHAGOGASTRODUODENOSCOPY (EGD) WITH PROPOFOL;  Surgeon: Danie Binder, MD;  [facility];  Service: Endoscopy;  Laterality: N/A;  930  . gum flap Bilateral   . HEMICOLECTOMY  RIGHT 2006  . HERNIA REPAIR    . LUMBAR DISC SURGERY    . SAVORY DILATION N/A 06/30/2012   Procedure: SAVORY DILATION;  Surgeon: Danie Binder, MD;  Location: [facility];  Service: Endoscopy;  Laterality: N/A;  12.56m , 176m 1563m. SAVORY DILATION N/A 02/20/2016   Procedure: SAVORY DILATION;  Surgeon: SanDanie BinderD;  [facility];  Service: Endoscopy;  Laterality: N/A;  . UPPER GASTROINTESTINAL ENDOSCOPY  JAN 2009 ABD PAIN   GASTRITIS, ESO RING  . UPPER GASTROINTESTINAL ENDOSCOPY  OCT 2010 ABD PAIN, DYSPHAGIA   DIL 15MM, GASTRITIS, NL DUODENUM  .  UPPER GASTROINTESTINAL ENDOSCOPY  OCT 2010 DYSPHAGIA   DIL 17 MM, GASTRITIS, NL DUODENUM  . vocal cord surgery  Sep 20, 2010   precancerous areas removed  . wisdom tooth extraction Right    pin placement due to broken jaw    There were no vitals filed for this visit.      Subjective Assessment - 10/04/16 1036    Subjective Pt feels that she is getting better.   Limitations Standing;Walking;House hold activities   How long can you sit comfortably? no problem    How long can you stand comfortably? able to stand for 6:37 was  15 seconds without touching something    How long can you walk comfortably? Pt completed a 6 minute walk test with not assistive device ambulated 1130 ft. ; (.95 meters/second) Able to walk for less than five minutes    Patient Stated Goals to improve her balance, to be able to walk with her dog    Currently in Pain? No/denies            [facility] - 10/04/16 0001      Assessment   Medical Diagnosis unsteady gait   Referring Provider Vikram Penumali    Onset Date/Surgical Date 05/05/16   Next MD Visit not scheduled   Prior Therapy not for this diagnosis      Precautions   Precautions None     Restrictions   Weight Bearing Restrictions No     Balance Screen   Has the patient fallen in the past 6 months --  not since original fall in December.    How many times? 1   Has the patient had a decrease in activity level because of a fear of falling?  --  improving    Is the patient reluctant to leave their home because of a fear of falling?  No     Home EnvEcologistsidence     Prior Function   Level of Independence Independent with basic ADLs   Vocation On disability  Do housework a little at a time due to chronic back pain.    Leisure walking dog, taking care of her garden      Cognition   Overall Cognitive Status Within Functional Limits for tasks assessed     Observation/Other Assessments   Focus on  Therapeutic Outcomes (FOTO)  40     Functional Tests   Functional tests Single leg stance;Sit to Stand     Single Leg Stance   Comments Lt 20 ; Rt 60   was Lt unable  Rt 8 seconds at eval; 3/15 LT 19      Sit to Stand   Comments 5 x in 26.79  was 32.42 at eval  then 26.79 on 3/15     Strength   Overall Strength Within functional limits for tasks performed            Vestibular Assessment - 10/04/16 0001      Vestibular Assessment   General Observation no longer using sunglasses      Symptom Behavior   Type of Dizziness Spinning   Frequency of Dizziness comes and goes was constant    Aggravating Factors Moving eyes     Occulomotor Exam   Occulomotor Alignment Normal   Smooth Pursuits Intact   Saccades Intact     Vestibulo-Occular Reflex   VOR 1 Head Only (x 1 viewing) normal      Dix-Hallpike Left   Dix-Hallpike Left Symptoms No nystagmus     Sidelying Right   Sidelying Right Symptoms No nystagmus     Sidelying Left   Sidelying Left Symptoms No nystagmus                 [facility] Adult PT Treatment/Exercise - 10/04/16 0001      Ambulation/Gait   Gait Comments 6 minute walk test      Exercises   Exercises Lumbar     Lumbar Exercises: Standing   Other Standing Lumbar Exercises 6 minutes without assistive  device no loss of balance    Other Standing Lumbar Exercises single leg stance      Lumbar Exercises: Seated   Sit to Stand 5 reps                PT Education - 10/04/16 1106    Education provided Yes   Education Details continue balance and eye exercises until dizziness resolves           PT Short Term Goals - 10/04/16 1108      PT SHORT TERM GOAL #1   Title Pt to be able to stand without UE assist for five minutes to allow completion of self grooming with improved safety   Time 2   Period Weeks   Status Partially Met  certain days yes others no     PT SHORT TERM GOAL #2   Title PT to state that her dizziness frequency  and intensity have decreased by 25%  to reduce risk of falling    Time 2   Period Weeks   Status Achieved     PT SHORT TERM GOAL #3   Title Pt to be able to walk for 10 minutes without holding onto walls to reduce risk of falling.    Time 2   Period Weeks   Status Achieved           PT Long Term Goals - 10/04/16 1109      PT LONG TERM GOAL #1   Title Pt to be able to stand without UE support for 15 minutes to be able to make a small meal without risk of falling    Time 4   Period Weeks   Status Achieved     PT LONG TERM GOAL #2   Title PT to have resumed driving    Time 4   Period Weeks   Status Partially Met  going short distances      PT LONG TERM GOAL #3   Title Pt to be able to single leg stance for 20 seconds B for reduce risk of falling    Time 4   Period Weeks   Status Achieved  Able to do so on right but  not on left                Plan - 2016/10/31 1121    Clinical Impression Statement Pt reassessed with goals met.   Pt still has occasional dizziness but no longer a fall risk.  Pt will be discharged to a HEP    Rehab Potential Good   PT Frequency 2x / week   PT Duration 4 weeks   PT Treatment/Interventions ADLs/Self Care Home Management;Canalith Repostioning;Gait training;Stair training;Functional mobility training;Therapeutic activities;Therapeutic exercise;Balance training;Neuromuscular re-education;Patient/family education;Manual techniques   PT Next Visit Plan discharge.       Patient will benefit from skilled therapeutic intervention in order to improve the following deficits and impairments:  Abnormal gait, Decreased activity tolerance, Decreased balance, Difficulty walking  Visit Diagnosis: Unsteadiness on feet  Difficulty in walking, not elsewhere classified       G-Codes - 31-Oct-2016 1112    Functional Assessment Tool Used (Outpatient Only) clinical judgement: able to complete 6 minute walking test without assistive device     Functional Limitation Mobility: Walking and moving around   Mobility: Walking and Moving Around Goal Status [phone]) At least 20 percent but less than 40 percent impaired, limited or restricted   Mobility: Walking and Moving Around Discharge Status [phone]) At least 20 percent but less than 40 percent impaired, limited or restricted      Problem List Patient Active Problem List   Diagnosis Date Noted  . Loss of weight 07/04/2015  . Osteoarthritis 12/27/2014  . Allergic dermatitis eyelid 12/27/2014  . Impetigo 04/05/2014  . Ganglion cyst of wrist 04/05/2014  . Abnormal CT scan, chest 01/12/2014  . Seborrheic dermatitis of scalp 04/12/2013  . Infected insect bite of left breast 11/17/2012  . Polycythemia 09/08/2012  . Vertigo 01/22/2012  . Chronic pain 12/27/2011  . Dysphagia 03/14/2011  . DEPRESSION 04/24/2010  . Chronic bronchitis ([place]) 01/19/2010  . Dyspnea 08/04/2009  . Osteoporosis 11/11/2008  . BACK PAIN, LUMBAR, WITH RADICULOPATHY 07/28/2008  . TRANSAMINASES, SERUM, ELEVATED 03/18/2008  . GENITAL HERPES 09/29/2007  . 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 Hyperlipidemia 02/03/2007  . VARCIES, SITE [place] 09/30/2006  . ANXIETY 02/27/2006  . Essential hypertension 02/27/2006  . Allergic rhinitis 02/27/2006  . GERD 02/27/2006  . Crohn's disease of small intestine with complication [facility]) 03/40/3524    Rayetta Humphrey, PT CLT [phone] 31-Oct-2016, 11:33 AM  [place] [address] [place], [place], [address] Phone: [phone]   Fax:  [phone]  Name: [name] [name] MRN: [id number] Date of Birth: 1955/11/28  PHYSICAL THERAPY DISCHARGE SUMMARY  Visits from Start of Care: 13  Current functional level related to goals / functional outcomes: See above   Remaining deficits: See above   Education / Equipment: HEP Plan: Patient agrees to discharge.  Patient goals were met. Patient is being discharged  due to meeting the stated rehab goals.  ?????       Rayetta Humphrey, [place] CLT [phone]

## 2016-10-04 NOTE — Patient Instructions (Addendum)
Balance: Unilateral    Attempt to balance on left leg, eyes open. Hold _30-60___ seconds. Repeat _3___ times per set. Do __1__ sets per session. Do __2__ sessions per day. Perform exercise with eyes closed.  http://orth.exer.us/28   Copyright  VHI. All rights reserved.  Feet Heel-Toe "Tandem", Head Motion - Eyes Closed    With eyes closed and right foot directly in front of the other, move head slowly, up and down. Repeat __5-10__ times per session. Do _2___ sessions per day.  Copyright  VHI. All rights reserved.  Functional Quadriceps: Sit to Stand    Sit on edge of chair, feet flat on floor. Stand upright, extending knees fully. Repeat _10___ times per set. Do __1__ sets per session. Do ____2 sessions per day.  http://orth.exer.us/734   Copyright  VHI. All rights reserved.

## 2016-10-09 ENCOUNTER — Ambulatory Visit (INDEPENDENT_AMBULATORY_CARE_PROVIDER_SITE_OTHER): Payer: Medicare Other | Admitting: Family Medicine

## 2016-10-09 ENCOUNTER — Encounter: Payer: Self-pay | Admitting: Family Medicine

## 2016-10-09 VITALS — BP 128/78 | HR 90 | Temp 98.6°F | Resp 14 | Ht 60.0 in | Wt 107.0 lb

## 2016-10-09 DIAGNOSIS — N907 Vulvar cyst: Secondary | ICD-10-CM

## 2016-10-09 DIAGNOSIS — N765 Ulceration of vagina: Secondary | ICD-10-CM

## 2016-10-09 DIAGNOSIS — J45909 Unspecified asthma, uncomplicated: Secondary | ICD-10-CM | POA: Diagnosis not present

## 2016-10-09 DIAGNOSIS — S6991XA Unspecified injury of right wrist, hand and finger(s), initial encounter: Secondary | ICD-10-CM | POA: Diagnosis not present

## 2016-10-09 MED ORDER — NYSTATIN 100000 UNIT/ML MT SUSP: OROMUCOSAL | 0 refills | Status: DC

## 2016-10-09 MED ORDER — ALBUTEROL SULFATE HFA 108 (90 BASE) MCG/ACT IN AERS: INHALATION_SPRAY | RESPIRATORY_TRACT | 2 refills | Status: DC

## 2016-10-09 MED ORDER — BUDESONIDE-FORMOTEROL FUMARATE 80-4.5 MCG/ACT IN AERO: 2.0000 | INHALATION_SPRAY | Freq: Two times a day (BID) | RESPIRATORY_TRACT | 11 refills | Status: DC

## 2016-10-09 MED ORDER — PREDNISONE 20 MG PO TABS: 20.0000 mg | ORAL_TABLET | Freq: Every day | ORAL | 0 refills | Status: DC

## 2016-10-09 MED ORDER — DOXYCYCLINE HYCLATE 100 MG PO TABS: 100.0000 mg | ORAL_TABLET | Freq: Two times a day (BID) | ORAL | 0 refills | Status: DC

## 2016-10-09 NOTE — Progress Notes (Signed)
Subjective:    Patient ID: [name] [name], female    DOB: 1955-11-23, 61 y.o.   MRN: [id number]  Patient presents for Illness (x weeks- productive cough, sore throat, chest congestion); Vaginal Lesion (states that she has bump like area to vaginal area that has bleed and is now resolving); and Finger Pain (states that she smacked index finger on R hand and now has redness and swelling)  Patient with multiple concerns. She is history of a chronic asthma bronchitis. She uses Symbicort and albuterol as needed. For the past couple weeks she's had increased drainage with intermittent hoarse voice or throat has had some wheezing on and off. She has not had any fever. Is not using any over-the-counter cough medicine.  About a week ago she noticed some irritation in her right vaginal area she states a couple days later it swelled up to a large lump and then it drained out a little pus and blood. She now still has some irritation and pain when she wipes and also continues to bleed intermittently.  She slammed her index finger and her right hand in the car door weeks ago. She still has some mild redness and tenderness on her finger but she is able to use it normally.   Review Of Systems:  GEN- denies fatigue, fever, weight loss,weakness, recent illness HEENT- denies eye drainage, change in vision, nasal discharge, CVS- denies chest pain, palpitations RESP- denies SOB, cough, wheeze ABD- denies N/V, change in stools, abd pain GU- denies dysuria, hematuria, dribbling, incontinence MSK- +joint pain, muscle aches, injury Neuro- denies headache, dizziness, syncope, seizure activity       Objective:    BP 128/78   Pulse 90   Temp 98.6 F (37 C) (Oral)   Resp 14   Ht 5' (1.524 m)   Wt 107 lb (48.5 kg)   SpO2 95%   BMI 20.90 kg/m  GEN- NAD, alert and oriented x3 HEENT- PERRL, EOMI, non injected sclera, pink conjunctiva, MMM, oropharynx clear, TM clear bilat, post nasal drip, no maxillary sinus  tenderness Neck- Supple, noLAD CVS- RRR, no murmur RESP-few scattered wheeze, upper airway congestion, normal WOB, mild hoarse voice  GU- , vaginal mucosa pink and moist, right labia majora inner- ulceration with mild bloody drainage, Left side sebaceous cyst appearing lesion between labia, NT  No other lesions, no lesions at introitus  EXT- No edema, Right hand index finger mild erythema at DIP, FROM finger, ligaments in tact  Pulses- Radial, DP- 2+        Assessment & Plan:      Problem List Items Addressed This Visit    None    Visit Diagnoses    Vaginal ulceration    -  Primary   Cultures placed, on doxy per above to cover bacterial infection, send to GYN for evalation also has the labial cyst which looks sebaceous   Relevant Orders   WOUND CULTURE   Herpes simplex virus culture   Ambulatory referral to Obstetrics / Gynecology   Labial cyst       Relevant Orders   Ambulatory referral to Obstetrics / Gynecology   Asthma with bronchitis       Gien prednisone 105m x 7 days, does not "like" high dose, continue inhalers, doxyycline   Relevant Medications   predniSONE (DELTASONE) 20 MG tablet   budesonide-formoterol (SYMBICORT) 80-4.5 MCG/ACT inhaler   albuterol (VENTOLIN HFA) 108 (90 Base) MCG/ACT inhaler   Injury of finger of right hand, initial encounter  Good ROM, mimimal swelling, discussed possible fracture at tip, but she is using without dificulty, can use finger splint for any pain or buddy taping      Note: This dictation was prepared with Dragon dictation along with smaller phrase technology. Any transcriptional errors that result from this process are unintentional.

## 2016-10-09 NOTE — Patient Instructions (Signed)
Take antibiotics, prednisone Use your inhaler  Take coricidan or mucinex DM Referral to GYN  F/U as needed

## 2016-10-10 ENCOUNTER — Ambulatory Visit (INDEPENDENT_AMBULATORY_CARE_PROVIDER_SITE_OTHER): Payer: Medicare Other | Admitting: Obstetrics and Gynecology

## 2016-10-10 ENCOUNTER — Encounter: Payer: Self-pay | Admitting: Obstetrics and Gynecology

## 2016-10-10 VITALS — BP 118/70 | HR 84 | Ht 60.0 in | Wt 107.6 lb

## 2016-10-10 DIAGNOSIS — B009 Herpesviral infection, unspecified: Secondary | ICD-10-CM

## 2016-10-10 MED ORDER — ACYCLOVIR 400 MG PO TABS: 400.0000 mg | ORAL_TABLET | Freq: Every day | ORAL | 1 refills | Status: DC

## 2016-10-10 NOTE — Progress Notes (Signed)
Patient ID: [name] [name], female   DOB: 1956-04-13, 61 y.o.   MRN: [id number]   [facility] Visit  @DATE @            Patient name: [name] [name] MRN [id number]  Date of birth: May 09, 1955  CC & HPI:   Chief Complaint  Patient presents with   labial cyst    vaginal ulceration     [name] is a 61 y.o. female presenting today for right vaginal ulceration and left labial cyst. Pt was seen by her PCP for this issue yesterday, ulceration cultures were placed and she was started on doxy to cover for bacterial infection. Pt states the area appeared suddenly and she has no prior h/o similar symptoms. She has been applying vaseline and witch hazel to the area.   ROS:  ROS +vaginal and labial pain   Pertinent History Reviewed:   Reviewed. 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   Crohn disease ([place])    Crohn disease ([place])    CTS (carpal tunnel syndrome)    Diarrhea MULITIFACTORIAL   IBS, LACTOSE INTOLERANCE, SBBO, BILE-SALT   Elevated liver enzymes 2009: BMI 24 ?Etoh ALT 94, AST 51,  NEG ANA, qIGs& ASMA   MAY 2012 AST 52 ALT 38   Esophageal stricture 2009   GERD (gastroesophageal reflux disease)    Hemorrhoid    Hyperlipemia    Hypertension    Inflammatory bowel disease 2006 CD   SURGICAL REMISSION   LBP (low back pain)    Migraine    PONV (postoperative nausea and vomiting)    Shortness of breath    exertion and humidity   Sleep apnea    Stop Bang Cock score of 4                              Surgical Hx:    Past Surgical History:  Procedure Laterality Date   BACK SURGERY     BILATERAL SALPINGOOPHORECTOMY     BIOPSY  12/24/2011   Procedure: BIOPSY;  Surgeon: Danie Binder, MD;  Location: [facility];  Service: Endoscopy;;  Gastric  Biopsies   BIOPSY N/A 06/30/2012   Procedure: BIOPSY;  Surgeon: Danie Binder, MD;  Location: [facility];  Service: Endoscopy;  Laterality: N/A;  Gastric and Esophageal Biopsies   CARPAL TUNNEL RELEASE  LEFT   CATARACT EXTRACTION W/PHACO Right 07/30/2016   Procedure: CATARACT EXTRACTION PHACO AND INTRAOCULAR LENS PLACEMENT ([place]);  Surgeon: Rutherford Guys, MD;  Location: [facility];  Service: Ophthalmology;  Laterality: Right;  CDE: 5.45   CATARACT EXTRACTION W/PHACO Left 08/13/2016   Procedure: CATARACT EXTRACTION PHACO AND INTRAOCULAR LENS PLACEMENT ([facility]);  Surgeon: Rutherford Guys, MD;  Location: [facility];  Service: Ophthalmology;  Laterality: Left;  CDE: 5.59   COLON SURGERY     COLONOSCOPY  JAN 2009 DIARRHEA, ABD PAIN, BRBPR   ANASTOMOTIC ULCER(3MM), IH XN:ATFTDD ULCER, NL COLON bX   DILATION AND CURETTAGE OF UTERUS     ESOPHAGEAL DILATION  12/24/2011   SLF: A stricture was found in the distal esophagus/ Moderate gastritis/ MILD Duodenitis   ESOPHAGOGASTRODUODENOSCOPY  02/07/09  mild gastritis   ESOPHAGOGASTRODUODENOSCOPY (EGD) WITH PROPOFOL N/A 06/30/2012   SLF: 1. No definite stricture appreciated 2. small hiatal hernia 3. Gastritis   ESOPHAGOGASTRODUODENOSCOPY (EGD) WITH PROPOFOL N/A 02/20/2016   Procedure: ESOPHAGOGASTRODUODENOSCOPY (EGD) WITH PROPOFOL;  Surgeon: Danie Binder, MD;  [facility];  Service: Endoscopy;  Laterality: N/A;  930   gum flap Bilateral    HEMICOLECTOMY  RIGHT 2006   HERNIA REPAIR     LUMBAR DISC SURGERY     SAVORY DILATION N/A 06/30/2012   Procedure: SAVORY DILATION;  Surgeon: Danie Binder, MD;  Location: [facility];  Service: Endoscopy;  Laterality: N/A;  12.67m , 181m 1576m SAVORY DILATION N/A 02/20/2016   Procedure: SAVORY DILATION;  Surgeon: SanDanie BinderD;  [facility];  Service: Endoscopy;  Laterality: N/A;   UPPER GASTROINTESTINAL ENDOSCOPY  JAN 2009 ABD PAIN   GASTRITIS, ESO RING   UPPER GASTROINTESTINAL ENDOSCOPY  OCT  2010 ABD PAIN, DYSPHAGIA   DIL 15MM, GASTRITIS, NL DUODENUM   UPPER GASTROINTESTINAL ENDOSCOPY  OCT 2010 DYSPHAGIA   DIL 17 MM, GASTRITIS, NL DUODENUM   vocal cord surgery  Sep 20, 2010   precancerous areas removed   wisdom tooth extraction Right    pin placement due to broken jaw   Medications: Reviewed & Updated - see associated section                       Current Outpatient Prescriptions:    albuterol (VENTOLIN HFA) 108 (90 Base) MCG/ACT inhaler, INHALE TWO PUFFS BY MOUTH EVERY 4 HOURS AS NEEDED FOR WHEEZING, Disp: 18 each, Rfl: 2   amLODipine (NORVASC) 5 MG tablet, TAKE ONE TABLET BY MOUTH ONCE DAILY, Disp: 90 tablet, Rfl: 3   budesonide-formoterol (SYMBICORT) 80-4.5 MCG/ACT inhaler, Inhale 2 puffs into the lungs 2 (two) times daily., Disp: 1 Inhaler, Rfl: 11   calcium carbonate (TUMS - DOSED IN MG ELEMENTAL CALCIUM) 500 MG chewable tablet, Chew 1-2 tablets by mouth 2 (two) times daily as needed for indigestion or heartburn., Disp: , Rfl:    cholestyramine (QUESTRAN) 4 GM/DOSE powder, DISSOLVE AND TAKE 2 GRAMS BY MOUTH TWICE DAILY WITH A MEAL - DO NOT TAKE WITHIN 2 HOURS OF OTHER MEDICATIONS, Disp: 378 g, Rfl: 3   dexlansoprazole (DEXILANT) 60 MG capsule, 1 PO EVERY MORNING WITH BREAKFAST. (Patient taking differently: Take 60 mg by mouth daily with breakfast. ), Disp: 30 capsule, Rfl: 11   diazepam (VALIUM) 5 MG tablet, TAKE ONE TABLET BY MOUTH EVERY 12 HOURS AS NEEDED **REFILLS  MUST  BE  30  DAYS  APART** (Patient taking differently: TAKE 1/2 TABLET BY MOUTH EVERY 12 HOURS AS NEEDED **REFILLS  MUST  BE  30  DAYS  APART**), Disp: 30 tablet, Rfl: 2   doxycycline (VIBRA-TABS) 100 MG tablet, Take 1 tablet (100 mg total) by mouth 2 (two) times daily., Disp: 14 tablet, Rfl: 0   fluticasone (FLONASE) 50 MCG/ACT nasal spray, USE TWO SPRAY(S) IN EACH NOSTRIL ONCE DAILY AS NEEDED FOR ALLERGY OR RHINITIS, Disp: 16 g, Rfl: 0   gabapentin (NEURONTIN) 400 MG capsule, Take 400 mg by mouth  3 (three) times daily. , Disp: , Rfl:    HYDROcodone-acetaminophen (NORCO) 10-325 MG per tablet, Take 1 tablet by mouth every 6 (six) hours as needed for severe pain. , Disp: , Rfl:    losartan-hydrochlorothiazide (HYZAAR) 50-12.5 MG tablet, Take 1 tablet by mouth daily. (Patient taking differently: Take 1  tablet by mouth every morning. 1100), Disp: 30 tablet, Rfl: 3   meclizine (ANTIVERT) 12.5 MG tablet, Take 1 tablet (12.5 mg total) by mouth 3 (three) times daily as needed for dizziness., Disp: 30 tablet, Rfl: 0   nystatin (MYCOSTATIN) 100000 UNIT/ML suspension, TAKE  5 ML BY MOUTH 4 TIMES DAILY AS NEEDED, Disp: 180 mL, Rfl: 0   Polyethylene Glycol 400 (BLINK TEARS) 0.25 % GEL, Place 1 drop into both eyes 2 (two) times daily. , Disp: , Rfl:    POTASSIUM PO, Take 50 mg by mouth daily. OTC, Disp: , Rfl:    predniSONE (DELTASONE) 20 MG tablet, Take 1 tablet (20 mg total) by mouth daily with breakfast., Disp: 7 tablet, Rfl: 0   Probiotic Product (Newborn) CAPS, Take 1 capsule by mouth daily., Disp: , Rfl:    Social History: Reviewed -  reports that she quit smoking about 8 months ago. Her smoking use included Cigarettes. She has a 15.00 pack-year smoking history. She has never used smokeless tobacco.  Objective Findings:  Vitals: Blood pressure 118/70, pulse 84, height 5' (1.524 m), weight 107 lb 9.6 oz (48.8 kg).  Physical Examination: General appearance - alert, well appearing, and in no distress Mental status - alert, oriented to person, place, and time Abdomen - soft, nontender, nondistended, no masses or organomegaly Pelvic - VULVA: 6 mm ulcerative lesion to the right labia minora. Left labial sebaceous cyst, VAGINA: normal appearing vagina with normal color and discharge, no lesions   Assessment & Plan:   A:  1. Labial ulceration - aphthous ulcers vs Behcet's disease,vs HSV lesion 2. Labial sebaceous cyst   P:  1. F/u by phone for culture results with PCP.  2.  Rx acyclovir 400 5x/d x 5 d 3. F/u in 6-8w for recheck after travels.    By signing my name below, I, Hansel Feinstein, attest that this documentation has been prepared under the direction and in the presence of Jonnie Kind, MD. Electronically Signed: Hansel Feinstein, ED Scribe. 10/10/16. 2:11 PM.  I personally performed the services described in this documentation, which was SCRIBED in my presence. The recorded information has been reviewed and considered accurate. It has been edited as necessary during review. Jonnie Kind, MD

## 2016-10-11 LAB — HERPES SIMPLEX VIRUS CULTURE: ORGANISM ID, BACTERIA: NOT DETECTED

## 2016-10-12 LAB — WOUND CULTURE
Gram Stain: NONE SEEN
Gram Stain: NONE SEEN
Gram Stain: NONE SEEN
Organism ID, Bacteria: NORMAL

## 2016-10-16 ENCOUNTER — Other Ambulatory Visit: Payer: Self-pay | Admitting: *Deleted

## 2016-11-03 ENCOUNTER — Other Ambulatory Visit: Payer: Self-pay | Admitting: Family Medicine

## 2016-11-04 NOTE — Telephone Encounter (Signed)
Okay to refill? 

## 2016-11-04 NOTE — Telephone Encounter (Signed)
Ok to refill??  Last office visit 10/09/2016.  Last refill 07/30/2016, #2 refills.

## 2016-11-05 NOTE — Telephone Encounter (Signed)
Medication called to pharmacy. 

## 2016-11-20 ENCOUNTER — Ambulatory Visit: Payer: Medicare Other | Admitting: Obstetrics and Gynecology

## 2016-12-02 DIAGNOSIS — Z79891 Long term (current) use of opiate analgesic: Secondary | ICD-10-CM | POA: Diagnosis not present

## 2016-12-02 DIAGNOSIS — J449 Chronic obstructive pulmonary disease, unspecified: Secondary | ICD-10-CM | POA: Diagnosis not present

## 2016-12-02 DIAGNOSIS — M542 Cervicalgia: Secondary | ICD-10-CM | POA: Diagnosis not present

## 2016-12-02 DIAGNOSIS — M79673 Pain in unspecified foot: Secondary | ICD-10-CM | POA: Diagnosis not present

## 2016-12-02 DIAGNOSIS — M545 Low back pain: Secondary | ICD-10-CM | POA: Diagnosis not present

## 2016-12-02 DIAGNOSIS — K50912 Crohn's disease, unspecified, with intestinal obstruction: Secondary | ICD-10-CM | POA: Diagnosis not present

## 2016-12-02 DIAGNOSIS — F0781 Postconcussional syndrome: Secondary | ICD-10-CM | POA: Diagnosis not present

## 2016-12-02 DIAGNOSIS — R1084 Generalized abdominal pain: Secondary | ICD-10-CM | POA: Diagnosis not present

## 2016-12-02 DIAGNOSIS — K589 Irritable bowel syndrome without diarrhea: Secondary | ICD-10-CM | POA: Diagnosis not present

## 2017-01-07 DIAGNOSIS — Z961 Presence of intraocular lens: Secondary | ICD-10-CM | POA: Diagnosis not present

## 2017-01-14 ENCOUNTER — Other Ambulatory Visit: Payer: Self-pay | Admitting: Family Medicine

## 2017-01-31 DIAGNOSIS — M542 Cervicalgia: Secondary | ICD-10-CM | POA: Diagnosis not present

## 2017-01-31 DIAGNOSIS — F0781 Postconcussional syndrome: Secondary | ICD-10-CM | POA: Diagnosis not present

## 2017-01-31 DIAGNOSIS — M545 Low back pain: Secondary | ICD-10-CM | POA: Diagnosis not present

## 2017-01-31 DIAGNOSIS — R1084 Generalized abdominal pain: Secondary | ICD-10-CM | POA: Diagnosis not present

## 2017-02-02 ENCOUNTER — Other Ambulatory Visit: Payer: Self-pay | Admitting: Family Medicine

## 2017-02-03 NOTE — Telephone Encounter (Signed)
Okay to refill? 

## 2017-02-03 NOTE — Telephone Encounter (Signed)
Medication called to pharmacy. 

## 2017-02-03 NOTE — Telephone Encounter (Signed)
Ok to refill??  Last office visit 10/09/2016.  Last refill 11/05/2016, #2 refills.

## 2017-02-18 DIAGNOSIS — M7741 Metatarsalgia, right foot: Secondary | ICD-10-CM | POA: Diagnosis not present

## 2017-02-18 DIAGNOSIS — M79671 Pain in right foot: Secondary | ICD-10-CM | POA: Diagnosis not present

## 2017-02-19 ENCOUNTER — Ambulatory Visit (INDEPENDENT_AMBULATORY_CARE_PROVIDER_SITE_OTHER): Payer: Medicare Other | Admitting: Adult Health

## 2017-02-19 ENCOUNTER — Encounter (INDEPENDENT_AMBULATORY_CARE_PROVIDER_SITE_OTHER): Payer: Self-pay

## 2017-02-19 ENCOUNTER — Encounter: Payer: Self-pay | Admitting: Adult Health

## 2017-02-19 VITALS — BP 120/84 | HR 95 | Ht 60.0 in | Wt 105.0 lb

## 2017-02-19 DIAGNOSIS — N765 Ulceration of vagina: Secondary | ICD-10-CM

## 2017-02-19 DIAGNOSIS — R102 Pelvic and perineal pain: Secondary | ICD-10-CM

## 2017-02-19 MED ORDER — CLOBETASOL PROPIONATE 0.05 % EX OINT: 1.0000 "application " | TOPICAL_OINTMENT | Freq: Two times a day (BID) | CUTANEOUS | 1 refills | Status: DC

## 2017-02-19 MED ORDER — DOXYCYCLINE HYCLATE 100 MG PO TABS: 100.0000 mg | ORAL_TABLET | Freq: Two times a day (BID) | ORAL | 0 refills | Status: DC

## 2017-02-19 MED ORDER — SULFAMETHOXAZOLE-TRIMETHOPRIM 800-160 MG PO TABS: 1.0000 | ORAL_TABLET | Freq: Two times a day (BID) | ORAL | 0 refills | Status: DC

## 2017-02-19 NOTE — Progress Notes (Signed)
Subjective:     Patient ID: [name] [name], female   DOB: [birth year], 61 y.o.   MRN: [id number]  HPI [name] [name] is a 61 year old white female in complaining of pain in vagina area,RLQ and has  lump left labia, it is draining green. She says she saw Dr Glo Herring in June and treated for ?herpes, but it was not that.   Review of Systems +RLQ pain  Lump left labia   Reviewed past medical,surgical, social and family history. Reviewed medications and allergies.     Objective:   Physical Exam BP 120/84 (BP Location: Left Arm, Patient Position: Sitting, Cuff Size: Normal)   Pulse 95   Ht 5' (1.524 m)   Wt 105 lb (47.6 kg)   BMI 20.51 kg/m    Skin warm and dry.Pelvic: external genitalia is normal in appearance, except has 2 x 3 cm ulcer left inner labia, it is tender,  vagina: pale and has loss of moisture,urethra has no lesions or masses noted, cervix:smooth, uterus: normal size, shape and contour, non tender, no masses felt, adnexa: no masses, some mild tenderness RLQ.  Bladder is non tender and no masses felt.  Has 2 separate areas left outer thigh, that looks like healing sores.  Assessment:     1. Vaginal aphthous ulcer   2. Pelvic pain       Plan:     Rx doxycyline 100 mg 1 bid x 10 days Rx septra ds 1 bid x 14 days Rx temovate 0.05% cream bid to affected area Return in 1 week for GYN [place] Then see me in 2 weeks to recheck ulcer

## 2017-02-21 ENCOUNTER — Telehealth: Payer: Self-pay | Admitting: *Deleted

## 2017-02-21 MED ORDER — TRIAMCINOLONE ACETONIDE 0.5 % EX OINT: 1.0000 "application " | TOPICAL_OINTMENT | Freq: Two times a day (BID) | CUTANEOUS | 0 refills | Status: DC

## 2017-02-21 NOTE — Telephone Encounter (Signed)
Will change rx

## 2017-02-21 NOTE — Telephone Encounter (Signed)
Clobetasol cream needs PA. Pharmacy states they should cover Triamcinolone 0.1%. Can we change? Please advise.

## 2017-02-21 NOTE — Addendum Note (Signed)
Addended by: Derrek Monaco A on: 02/21/2017 02:26 PM   Modules accepted: Orders

## 2017-02-22 DIAGNOSIS — Z23 Encounter for immunization: Secondary | ICD-10-CM | POA: Diagnosis not present

## 2017-02-24 ENCOUNTER — Other Ambulatory Visit: Payer: Self-pay | Admitting: Family Medicine

## 2017-02-24 DIAGNOSIS — Z1231 Encounter for screening mammogram for malignant neoplasm of breast: Secondary | ICD-10-CM

## 2017-02-26 ENCOUNTER — Ambulatory Visit (INDEPENDENT_AMBULATORY_CARE_PROVIDER_SITE_OTHER): Payer: Medicare Other

## 2017-02-26 DIAGNOSIS — R102 Pelvic and perineal pain: Secondary | ICD-10-CM | POA: Diagnosis not present

## 2017-02-26 NOTE — Progress Notes (Signed)
PELVIC US TA/TV: atropic homogeneous anteverted uterus wnl, trace of fluid w/in the endometrium,EEC 1 mm,mult.nabothian cysts,normal ovaries bilat,fluid filled bowel (hx of Crohn's dz),no pain during ultrasound,ovaries appear mobile

## 2017-02-27 ENCOUNTER — Encounter: Payer: Self-pay | Admitting: Family Medicine

## 2017-02-27 ENCOUNTER — Encounter (HOSPITAL_COMMUNITY)
Admission: RE | Admit: 2017-02-27 | Discharge: 2017-02-27 | Disposition: A | Discharge status: Home / Self Care | Payer: Medicare Other | Source: Ambulatory Visit | Attending: Family Medicine | Admitting: Family Medicine

## 2017-02-28 ENCOUNTER — Telehealth: Payer: Self-pay | Admitting: Adult Health

## 2017-02-28 NOTE — Telephone Encounter (Signed)
Pt aware US showed normal uterus and ovaries

## 2017-03-05 ENCOUNTER — Encounter: Payer: Self-pay | Admitting: Adult Health

## 2017-03-05 ENCOUNTER — Ambulatory Visit (INDEPENDENT_AMBULATORY_CARE_PROVIDER_SITE_OTHER): Payer: Medicare Other | Admitting: Adult Health

## 2017-03-05 VITALS — BP 128/80 | HR 87 | Ht 60.0 in | Wt 106.5 lb

## 2017-03-05 DIAGNOSIS — N765 Ulceration of vagina: Secondary | ICD-10-CM

## 2017-03-05 NOTE — Progress Notes (Signed)
Subjective:     Patient ID: [name] [name], female   DOB: January 05, 1956, 61 y.o.   MRN: [id number]  HPI [name] is a 61 year old white female back in follow up on ulcer left labia.  Review of Systems Ulcer better Reviewed past medical,surgical, social and family history. Reviewed medications and allergies.     Objective:   Physical Exam BP 128/80 (BP Location: Left Arm, Patient Position: Sitting, Cuff Size: Normal)   Pulse 87   Ht 5' (1.524 m)   Wt 106 lb 8 oz (48.3 kg)   BMI 20.80 kg/m    Skin warm and dry, ulcer left inner labia is smaller and white now, no drainage, itches some.  Assessment:     1. Vaginal aphthous ulcer       Plan:     Follow up in 4 weeks for pap and physical and recheck ulcer

## 2017-03-19 ENCOUNTER — Ambulatory Visit (HOSPITAL_COMMUNITY): Payer: Medicare Other

## 2017-03-19 ENCOUNTER — Encounter (HOSPITAL_COMMUNITY)
Admission: RE | Admit: 2017-03-19 | Discharge: 2017-03-19 | Disposition: A | Discharge status: Home / Self Care | Payer: Medicare Other | Source: Ambulatory Visit | Attending: Family Medicine | Admitting: Family Medicine

## 2017-03-31 ENCOUNTER — Ambulatory Visit (HOSPITAL_COMMUNITY): Payer: Medicare Other

## 2017-03-31 DIAGNOSIS — R1084 Generalized abdominal pain: Secondary | ICD-10-CM | POA: Diagnosis not present

## 2017-03-31 DIAGNOSIS — Z79891 Long term (current) use of opiate analgesic: Secondary | ICD-10-CM | POA: Diagnosis not present

## 2017-03-31 DIAGNOSIS — M545 Low back pain: Secondary | ICD-10-CM | POA: Diagnosis not present

## 2017-03-31 DIAGNOSIS — M542 Cervicalgia: Secondary | ICD-10-CM | POA: Diagnosis not present

## 2017-04-02 ENCOUNTER — Ambulatory Visit (INDEPENDENT_AMBULATORY_CARE_PROVIDER_SITE_OTHER): Payer: Medicare Other | Admitting: Adult Health

## 2017-04-02 ENCOUNTER — Encounter: Payer: Self-pay | Admitting: Adult Health

## 2017-04-02 ENCOUNTER — Other Ambulatory Visit (HOSPITAL_COMMUNITY)
Admission: RE | Admit: 2017-04-02 | Discharge: 2017-04-02 | Disposition: A | Discharge status: Home / Self Care | Payer: Medicare Other | Source: Ambulatory Visit | Attending: Adult Health | Admitting: Adult Health

## 2017-04-02 VITALS — BP 130/80 | HR 86 | Ht 59.0 in | Wt 104.5 lb

## 2017-04-02 DIAGNOSIS — Z1212 Encounter for screening for malignant neoplasm of rectum: Secondary | ICD-10-CM

## 2017-04-02 DIAGNOSIS — Z01419 Encounter for gynecological examination (general) (routine) without abnormal findings: Secondary | ICD-10-CM

## 2017-04-02 DIAGNOSIS — Z124 Encounter for screening for malignant neoplasm of cervix: Secondary | ICD-10-CM | POA: Insufficient documentation

## 2017-04-02 DIAGNOSIS — M25511 Pain in right shoulder: Secondary | ICD-10-CM | POA: Insufficient documentation

## 2017-04-02 DIAGNOSIS — Z1211 Encounter for screening for malignant neoplasm of colon: Secondary | ICD-10-CM

## 2017-04-02 LAB — HEMOCCULT GUIAC POC 1CARD (OFFICE): FECAL OCCULT BLD: NEGATIVE

## 2017-04-02 MED ORDER — TRIAMCINOLONE ACETONIDE 0.5 % EX OINT: 1.0000 "application " | TOPICAL_OINTMENT | Freq: Two times a day (BID) | CUTANEOUS | 2 refills | Status: DC

## 2017-04-02 NOTE — Progress Notes (Signed)
Patient ID: DAWNE CASALI, female   DOB: [birth year], 61 y.o.   MRN: [id number] History of Present Illness:  Fae is a 61 year old white female in for well woman gyn exam and pap. PCP is Dr Buelah Manis.  Current Medications, Allergies, Past Medical History, Past Surgical History, Family History and Social History were reviewed in [place] record.     Review of Systems: Patient denies any headaches, hearing loss, fatigue, blurred vision, shortness of breath, chest pain, abdominal pain, problems with bowel movements, urination, or intercourse(nit having sex). No  mood swings.+pain in right shoulder    Physical Exam:BP 130/80 (BP Location: Left Arm, Patient Position: Sitting, Cuff Size: Normal)   Pulse 86   Ht 4' 11"  (1.499 m)   Wt 104 lb 8 oz (47.4 kg)   BMI 21.11 kg/m  General:  Well developed, well nourished, no acute distress Skin:  Warm and dry Neck:  Midline trachea, normal thyroid, good ROM, no lymphadenopathy Lungs; Clear to auscultation bilaterally Breast:  No dominant palpable mass, retraction, or nipple discharge Cardiovascular: Regular rate and rhythm Abdomen:  Soft, non tender, no hepatosplenomegaly Pelvic:  External genitalia is normal in appearance, no lesions.Ulcer healed and gone.  The vagina is atrophic. Urethra has no lesions or masses. The cervix is smooth, pap with HPV performed.  Uterus is felt to be normal size, shape, and contour.  No adnexal masses or tenderness noted.Bladder is non tender, no masses felt. Rectal: Good sphincter tone, no polyps, or hemorrhoids felt.  Hemoccult negative. Extremities/musculoskeletal:  No swelling or varicosities noted, no clubbing or cyanosis, has point tenderness right shoulder and limited ROM, no known injury.Has healing scabbed area left thigh. Psych:  No mood changes, alert and cooperative,seems happy PHQ 9 score 7, denies being suicidal. Has chronic pain and sees Dr Merlene Laughter.  Impression: 1. Encounter for  gynecological examination with Papanicolaou smear of cervix   2. Routine cervical smear   3. Screening for colorectal cancer   4. Right shoulder pain, unspecified chronicity       Plan: Meds ordered this encounter  Medications  . triamcinolone ointment (KENALOG) 0.5 %    Sig: Apply 1 application topically 2 (two) times daily.    Dispense:  30 g    Refill:  2    Order Specific Question:   Supervising Provider    Answer:   Florian Buff [[id number]]  Physical in 2 years, pap then Mammogram yearly Labs with PCP  Colonoscopy per GI Referred to Dr Luna Glasgow to evaluate right shoulder

## 2017-04-03 LAB — CYTOLOGY - PAP
Diagnosis: NEGATIVE
HPV: NOT DETECTED

## 2017-04-10 ENCOUNTER — Encounter: Payer: Self-pay | Admitting: Orthopaedic Surgery

## 2017-04-10 ENCOUNTER — Ambulatory Visit (INDEPENDENT_AMBULATORY_CARE_PROVIDER_SITE_OTHER): Payer: Medicare Other

## 2017-04-10 ENCOUNTER — Other Ambulatory Visit: Payer: Self-pay | Admitting: Family Medicine

## 2017-04-10 ENCOUNTER — Ambulatory Visit (INDEPENDENT_AMBULATORY_CARE_PROVIDER_SITE_OTHER): Payer: Medicare Other | Admitting: Orthopaedic Surgery

## 2017-04-10 VITALS — BP 137/88 | HR 86 | Temp 97.2°F | Ht 60.0 in | Wt 104.0 lb

## 2017-04-10 DIAGNOSIS — M25511 Pain in right shoulder: Secondary | ICD-10-CM | POA: Diagnosis not present

## 2017-04-10 DIAGNOSIS — K50019 Crohn's disease of small intestine with unspecified complications: Secondary | ICD-10-CM

## 2017-04-10 DIAGNOSIS — J410 Simple chronic bronchitis: Secondary | ICD-10-CM

## 2017-04-10 NOTE — Progress Notes (Signed)
Subjective:    Patient ID: [name] [name], female    DOB: [birth year], 61 y.o.   MRN: [id number]  HPI She has had right shoulder pain since around Halloween of this year.  She was cleaning blinds and noticed her right shoulder hurt when raising above her head.  This has gotten progressively worse.  She has no direct trauma, no numbness, no redness, no swelling.  She has tried Advil, heat and ice with little help. She saw her family doctor recently and is referred here.  She has no neck pain.  She has no other joint pain.  She has Crohn disease and cannot take NSAIDs.    Review of Systems  Respiratory: Positive for shortness of breath.   Cardiovascular: Negative for chest pain and leg swelling.  Musculoskeletal: Positive for arthralgias.  Psychiatric/Behavioral: The patient is nervous/anxious.   All other systems reviewed and are negative.  Past Medical History:  Diagnosis Date  . Allergic rhinitis   . Anastomotic ulcer JAN 2009  . Anxiety   . Asthma   . BMI (body mass index) 20.0-29.9 2009 121 lbs  . Concussion   . 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 Crohn disease ([place])   . Crohn disease ([place])   . CTS (carpal tunnel syndrome)   . Diarrhea MULITIFACTORIAL   IBS, LACTOSE INTOLERANCE, SBBO, BILE-SALT  . Elevated liver enzymes 2009: BMI 24 ?Etoh ALT 94, AST 51,  NEG ANA, qIGs& ASMA   MAY 2012 AST 52 ALT 38  . Esophageal stricture 2009  . GERD (gastroesophageal reflux disease)   . Hemorrhoid   . Hyperlipemia   . Hypertension   . Inflammatory bowel disease 2006 CD   SURGICAL REMISSION  . LBP (low back pain)   . Mental disorder   . Migraine   . PONV (postoperative nausea and vomiting)   . Shortness of breath    exertion and humidity  . Shoulder pain    right  . Sleep apnea    Stop [name]    Past Surgical History:  Procedure Laterality Date  . BACK SURGERY    . BILATERAL SALPINGOOPHORECTOMY    . BIOPSY  12/24/2011   Procedure: BIOPSY;  Surgeon: Danie Binder, MD;  Location: [facility];  Service: Endoscopy;;  Gastric Biopsies  . BIOPSY N/A 06/30/2012   Procedure: BIOPSY;  Surgeon: Danie Binder, MD;  Location: [facility];  Service: Endoscopy;  Laterality: N/A;  Gastric and Esophageal Biopsies  . CARPAL TUNNEL RELEASE  LEFT  . CATARACT EXTRACTION W/PHACO Right 07/30/2016   Procedure: CATARACT EXTRACTION PHACO AND INTRAOCULAR LENS PLACEMENT (IOC);  Surgeon: Rutherford Guys, MD;  Location: [facility];  Service: Ophthalmology;  Laterality: Right;  CDE: 5.45  . CATARACT EXTRACTION W/PHACO Left 08/13/2016   Procedure: CATARACT EXTRACTION PHACO AND INTRAOCULAR LENS PLACEMENT (IOC);  Surgeon: Rutherford Guys, MD;  Location: [facility];  Service: Ophthalmology;  Laterality: Left;  CDE: 5.59  . COLON SURGERY    . COLONOSCOPY  JAN 2009 DIARRHEA, ABD PAIN, BRBPR   ANASTOMOTIC ULCER(3MM), IH [id number] ULCER, NL COLON bX  . DILATION AND CURETTAGE OF UTERUS    . ESOPHAGEAL DILATION  12/24/2011   SLF: A stricture was found in the distal esophagus/ Moderate gastritis/ MILD Duodenitis  . ESOPHAGOGASTRODUODENOSCOPY  02/07/09   mild gastritis  . ESOPHAGOGASTRODUODENOSCOPY (EGD) WITH PROPOFOL N/A 06/30/2012   SLF: 1. No definite stricture appreciated 2. small hiatal hernia 3. Gastritis  . ESOPHAGOGASTRODUODENOSCOPY (EGD)  WITH PROPOFOL N/A 02/20/2016   Procedure: ESOPHAGOGASTRODUODENOSCOPY (EGD) WITH PROPOFOL;  Surgeon: Danie Binder, MD;  [facility];  Service: Endoscopy;  Laterality: N/A;  930  . gum flap Bilateral   . HEMICOLECTOMY  RIGHT 2006  . HERNIA REPAIR    . LUMBAR DISC SURGERY    . SAVORY DILATION N/A 06/30/2012   Procedure: SAVORY DILATION;  Surgeon: Danie Binder, MD;  [facility];  Service: Endoscopy;  Laterality: N/A;  12.49m , 156m 1538m. SAVORY DILATION N/A 02/20/2016   Procedure: SAVORY DILATION;  Surgeon: SanDanie BinderD;  [facility];  Service: Endoscopy;  Laterality: N/A;  . UPPER GASTROINTESTINAL ENDOSCOPY  JAN 2009 ABD PAIN     GASTRITIS, ESO RING  . UPPER GASTROINTESTINAL ENDOSCOPY  OCT 2010 ABD PAIN, DYSPHAGIA   DIL 15MM, GASTRITIS, NL DUODENUM  . UPPER GASTROINTESTINAL ENDOSCOPY  OCT 2010 DYSPHAGIA   DIL 17 MM, GASTRITIS, NL DUODENUM  . vocal cord surgery  Sep 20, 2010   precancerous areas removed  . wisdom tooth extraction Right    pin placement due to broken jaw    Current Outpatient Medications on File Prior to Visit  Medication Sig Dispense Refill  . amLODipine (NORVASC) 5 MG tablet TAKE ONE TABLET BY MOUTH ONCE DAILY 90 tablet 3  . budesonide-formoterol (SYMBICORT) 80-4.5 MCG/ACT inhaler Inhale 2 puffs into the lungs 2 (two) times daily. 1 Inhaler 11  . calcium carbonate (TUMS - DOSED IN MG ELEMENTAL CALCIUM) 500 MG chewable tablet Chew 1-2 tablets by mouth 2 (two) times daily as needed for indigestion or heartburn.    . cholestyramine (QUESTRAN) 4 GM/DOSE powder DISSOLVE AND TAKE 2 GRAMS BY MOUTH TWICE DAILY WITH A MEAL - DO NOT TAKE WITHIN 2 HOURS OF OTHER MEDICATIONS 378 g 3  . dexlansoprazole (DEXILANT) 60 MG capsule 1 PO EVERY MORNING WITH BREAKFAST. (Patient taking differently: Take 60 mg by mouth daily with breakfast. ) 30 capsule 11  . diazepam (VALIUM) 5 MG tablet TAKE 1 TABLET BY MOUTH EVERY 12 HOURS AS NEEDED **MUST  LAST  30  DAYS** 30 tablet 2  . fluticasone (FLONASE) 50 MCG/ACT nasal spray USE TWO SPRAY(S) IN EACH NOSTRIL ONCE DAILY AS NEEDED FOR ALLERGY OR RHINITIS 16 g 0  . gabapentin (NEURONTIN) 400 MG capsule Take 400 mg by mouth 3 (three) times daily.     . lMarland Kitchensartan-hydrochlorothiazide (HYZAAR) 50-12.5 MG tablet Take 1 tablet by mouth daily. (Patient taking differently: Take 1 tablet by mouth every morning. 1100) 30 tablet 3  . oxyCODONE-acetaminophen (PERCOCET) 10-325 MG tablet Take 1 tablet by mouth 3 (three) times daily.     . PMarland KitchenTASSIUM PO Take 50 mg by mouth daily. OTC    . triamcinolone ointment (KENALOG) 0.5 % Apply 1 application topically 2 (two) times daily. 30 g 2  . albuterol  (VENTOLIN HFA) 108 (90 Base) MCG/ACT inhaler INHALE TWO PUFFS BY MOUTH EVERY 4 HOURS AS NEEDED FOR WHEEZING 18 each 2   No current facility-administered medications on file prior to visit.     Social History   Socioeconomic History  . Marital status: Married    Spouse name: WayPatrick [place] Number of children: 2  . Years of education: GED  . Highest education level: Not on file  Social Needs  . Financial resource strain: Not on file  . Food insecurity - worry: Not on file  . Food insecurity - inability: Not on file  . Transportation needs -  medical: Not on file  . Transportation needs - non-medical: Not on file  Occupational History    Comment: disabled  Tobacco Use  . Smoking status: Former Smoker    Packs/day: 0.00    Years: 30.00    Pack years: 0.00    Types: Cigarettes    Last attempt to quit: 01/28/2016    Years since quitting: 1.2  . Smokeless tobacco: Never Used  Substance and Sexual Activity  . Alcohol use: Yes    Comment: "beer here and there"  . Drug use: No  . Sexual activity: Not Currently    Birth control/protection: Post-menopausal  Other Topics Concern  . Not on file  Social History Narrative   Lives with husband   no caffiene    Family History  Problem Relation Age of Onset  . Hypothyroidism Mother   . Heart disease Father   . Parkinson's disease Father   . Hypothyroidism Daughter   . Heart attack Paternal Grandfather   . Alzheimer's disease Paternal Grandmother   . Heart attack Maternal Grandfather   . Crohn's disease Sister   . Other Sister        was murdered  . Crohn's disease Maternal Uncle   . Colon cancer Neg Hx   . Colon polyps Neg Hx     BP 137/88   Pulse 86   Temp (!) 97.2 F (36.2 C)   Ht 5' (1.524 m)   Wt 104 lb (47.2 kg)   BMI 20.31 kg/m      Objective:   Physical Exam  Constitutional: She is oriented to person, place, and time. She appears well-developed and well-nourished.  HENT:  Head: Normocephalic and atraumatic.    Eyes: Conjunctivae and EOM are normal. Pupils are equal, round, and reactive to light.  Neck: Normal range of motion. Neck supple.  Cardiovascular: Normal rate, regular rhythm and intact distal pulses.  Pulmonary/Chest: Effort normal.  Abdominal: Soft.  Musculoskeletal: She exhibits tenderness (Pain right shoulder, ROM limited, forward 90, abduction 50. internal 20. external 20. extension 5, adduction 25, NV intact, ROM neck full, left shoulder negative, grips normal.).  Neurological: She is alert and oriented to person, place, and time. She displays normal reflexes. No cranial nerve deficit. She exhibits normal muscle tone. Coordination normal.  Skin: Skin is warm and dry.  Psychiatric: She has a normal mood and affect. Her behavior is normal. Judgment and thought content normal.  Vitals reviewed.   X-rays were done of the right shoulder, reported separately.       Assessment & Plan:   Encounter Diagnoses  Name Primary?  . Pain in joint of right shoulder Yes  . Simple chronic bronchitis ([place])   . Crohn's disease of small intestine with complication ([place])    She has bursitis of the right shoulder.    I have injected the shoulder as she cannot take NSAIDs.  Exercises given.  PROCEDURE NOTE:  The patient request injection, verbal consent was obtained.  The right shoulder was prepped appropriately after time out was performed.   Sterile technique was observed and injection of 1 cc of Depo-Medrol 40 mg with several cc's of plain xylocaine. Anesthesia was provided by ethyl chloride and a 20-gauge needle was used to inject the shoulder area. A posterior approach was used.  The injection was tolerated well.  A band aid dressing was applied.  The patient was advised to apply ice later today and tomorrow to the injection sight as needed.  Return in one  month.  Call if any problem.  Precautions discussed.   Electronically Signed Sanjuana Kava, MD 12/6/20188:49 AM

## 2017-04-10 NOTE — Progress Notes (Signed)
[name] [name]

## 2017-04-10 NOTE — Patient Instructions (Signed)

## 2017-04-14 ENCOUNTER — Other Ambulatory Visit: Payer: Self-pay | Admitting: Family Medicine

## 2017-04-28 ENCOUNTER — Emergency Department (HOSPITAL_COMMUNITY)
Admission: EM | Admit: 2017-04-28 | Discharge: 2017-04-28 | Disposition: A | Discharge status: Home / Self Care | Payer: Medicare Other

## 2017-04-28 NOTE — ED Notes (Signed)
Pt stated she could not sit up and needed to lie down, when informed pt of the wait time, pt stated she would go back home; pt escorted out of the ED by Lauren NT via wheelchair

## 2017-05-08 ENCOUNTER — Encounter: Payer: Self-pay | Admitting: Orthopaedic Surgery

## 2017-05-08 ENCOUNTER — Ambulatory Visit (INDEPENDENT_AMBULATORY_CARE_PROVIDER_SITE_OTHER): Payer: Medicare Other | Admitting: Orthopaedic Surgery

## 2017-05-08 VITALS — BP 124/85 | HR 82 | Temp 97.3°F | Ht 60.0 in | Wt 104.0 lb

## 2017-05-08 DIAGNOSIS — K50019 Crohn's disease of small intestine with unspecified complications: Secondary | ICD-10-CM

## 2017-05-08 DIAGNOSIS — M25511 Pain in right shoulder: Secondary | ICD-10-CM

## 2017-05-08 NOTE — Progress Notes (Signed)
PROCEDURE NOTE:  The patient request injection, verbal consent was obtained.  The right shoulder was prepped appropriately after time out was performed.   Sterile technique was observed and injection of 1 cc of Depo-Medrol 40 mg with several cc's of plain xylocaine. Anesthesia was provided by ethyl chloride and a 20-gauge needle was used to inject the shoulder area. A posterior approach was used.  The injection was tolerated well.  A band aid dressing was applied.  The patient was advised to apply ice later today and tomorrow to the injection sight as needed.  I will see her in one month.  Call if any problem.  Precautions discussed.   Electronically Signed Sanjuana Kava, MD 1/3/20198:51 AM

## 2017-05-12 ENCOUNTER — Other Ambulatory Visit: Payer: Self-pay | Admitting: Family Medicine

## 2017-05-13 NOTE — Telephone Encounter (Signed)
Ok to refill??  Last office visit 10/09/2016.  Last refill 02/03/2017, #2 refills.

## 2017-06-05 ENCOUNTER — Encounter: Payer: Self-pay | Admitting: Orthopaedic Surgery

## 2017-06-05 ENCOUNTER — Ambulatory Visit (INDEPENDENT_AMBULATORY_CARE_PROVIDER_SITE_OTHER): Payer: Medicare Other | Admitting: Orthopaedic Surgery

## 2017-06-05 VITALS — BP 117/78 | HR 83 | Temp 97.4°F | Ht 59.0 in | Wt 102.0 lb

## 2017-06-05 DIAGNOSIS — M25511 Pain in right shoulder: Secondary | ICD-10-CM | POA: Diagnosis not present

## 2017-06-05 NOTE — Progress Notes (Signed)
As Patient [name] [name], female DOB:[birth year], 61 y.o. GBT:[id number]  Chief Complaint  Patient presents with  . Follow-up    Recheck on right shoulder.    HPI  [name] [name] is a 62 y.o. female who has right shoulder pain.  She is a little better.  The cold weather makes it worse. It was 14 this morning.  She has no paresthesias, no new trauma.  She is doing her exercises and they seem to help.   HPI  Body mass index is 20.6 kg/m.  ROS  Review of Systems  Respiratory: Positive for shortness of breath.   Cardiovascular: Negative for chest pain and leg swelling.  Musculoskeletal: Positive for arthralgias.  Psychiatric/Behavioral: The patient is nervous/anxious.   All other systems reviewed and are negative.   Past Medical History:  Diagnosis Date  . Allergic rhinitis   . Anastomotic ulcer JAN 2009  . Anxiety   . Asthma   . BMI (body mass index) 20.0-29.9 2009 121 lbs  . Concussion   . 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 Crohn disease ([place])   . Crohn disease ([place])   . CTS (carpal tunnel syndrome)   . Diarrhea MULITIFACTORIAL   IBS, LACTOSE INTOLERANCE, SBBO, BILE-SALT  . Elevated liver enzymes 2009: BMI 24 ?Etoh ALT 94, AST 51,  NEG ANA, qIGs& ASMA   MAY 2012 AST 52 ALT 38  . Esophageal stricture 2009  . GERD (gastroesophageal reflux disease)   . Hemorrhoid   . Hyperlipemia   . Hypertension   . Inflammatory bowel disease 2006 CD   SURGICAL REMISSION  . LBP (low back pain)   . Mental disorder   . Migraine   . PONV (postoperative nausea and vomiting)   . Shortness of breath    exertion and humidity  . Shoulder pain    right  . Sleep apnea    Stop [name]    Past Surgical History:  Procedure Laterality Date  . BACK SURGERY    . BILATERAL SALPINGOOPHORECTOMY    . BIOPSY  12/24/2011   Procedure: BIOPSY;  Surgeon: Danie Binder, MD;  [facility];  Service: Endoscopy;;  Gastric Biopsies  . BIOPSY N/A 06/30/2012   Procedure: BIOPSY;   Surgeon: Danie Binder, MD;  Location: [facility];  Service: Endoscopy;  Laterality: N/A;  Gastric and Esophageal Biopsies  . CARPAL TUNNEL RELEASE  LEFT  . CATARACT EXTRACTION W/PHACO Right 07/30/2016   Procedure: CATARACT EXTRACTION PHACO AND INTRAOCULAR LENS PLACEMENT (IOC);  Surgeon: Rutherford Guys, MD;  Location: [facility];  Service: Ophthalmology;  Laterality: Right;  CDE: 5.45  . CATARACT EXTRACTION W/PHACO Left 08/13/2016   Procedure: CATARACT EXTRACTION PHACO AND INTRAOCULAR LENS PLACEMENT (IOC);  Surgeon: Rutherford Guys, MD;  Location: [facility];  Service: Ophthalmology;  Laterality: Left;  CDE: 5.59  . COLON SURGERY    . COLONOSCOPY  JAN 2009 DIARRHEA, ABD PAIN, BRBPR   ANASTOMOTIC ULCER(3MM), IH XT:GGYIRS ULCER, NL COLON bX  . DILATION AND CURETTAGE OF UTERUS    . ESOPHAGEAL DILATION  12/24/2011   SLF: A stricture was found in the distal esophagus/ Moderate gastritis/ MILD Duodenitis  . ESOPHAGOGASTRODUODENOSCOPY  02/07/09   mild gastritis  . ESOPHAGOGASTRODUODENOSCOPY (EGD) WITH PROPOFOL N/A 06/30/2012   SLF: 1. No definite stricture appreciated 2. small hiatal hernia 3. Gastritis  . ESOPHAGOGASTRODUODENOSCOPY (EGD) WITH PROPOFOL N/A 02/20/2016   Procedure: ESOPHAGOGASTRODUODENOSCOPY (EGD) WITH PROPOFOL;  Surgeon: Danie Binder, MD;  [facility];  Service: Endoscopy;  Laterality: N/A;  930  . gum flap Bilateral   . HEMICOLECTOMY  RIGHT 2006  . HERNIA REPAIR    . LUMBAR DISC SURGERY    . SAVORY DILATION N/A 06/30/2012   Procedure: SAVORY DILATION;  Surgeon: Danie Binder, MD;  Location[facility];  Service: Endoscopy;  Laterality: N/A;  12.83m , 155m 1551m. SAVORY DILATION N/A 02/20/2016   Procedure: SAVORY DILATION;  Surgeon: SanDanie BinderD;  [facility];  Service: Endoscopy;  Laterality: N/A;  . UPPER GASTROINTESTINAL ENDOSCOPY  JAN 2009 ABD PAIN   GASTRITIS, ESO RING  . UPPER GASTROINTESTINAL ENDOSCOPY  OCT 2010 ABD PAIN, DYSPHAGIA   DIL 15MM, GASTRITIS, NL  DUODENUM  . UPPER GASTROINTESTINAL ENDOSCOPY  OCT 2010 DYSPHAGIA   DIL 17 MM, GASTRITIS, NL DUODENUM  . vocal cord surgery  Sep 20, 2010   precancerous areas removed  . wisdom tooth extraction Right    pin placement due to broken jaw    Family History  Problem Relation Age of Onset  . Hypothyroidism Mother   . Heart disease Father   . Parkinson's disease Father   . Hypothyroidism Daughter   . Heart attack Paternal Grandfather   . Alzheimer's disease Paternal Grandmother   . Heart attack Maternal Grandfather   . Crohn's disease Sister   . Other Sister        was murdered  . Crohn's disease Maternal Uncle   . Colon cancer Neg Hx   . Colon polyps Neg Hx     Social History Social History   Tobacco Use  . Smoking status: Former Smoker    Packs/day: 0.00    Years: 30.00    Pack years: 0.00    Types: Cigarettes    Last attempt to quit: 01/28/2016    Years since quitting: 1.3  . Smokeless tobacco: Never Used  Substance Use Topics  . Alcohol use: Yes    Comment: "beer here and there"  . Drug use: No    Allergies  Allergen Reactions  . Lovastatin Other (See Comments)    Chest pain  . Naproxen Other (See Comments)    Chest pain/ SOB   . Toradol [Ketorolac Tromethamine] Other (See Comments)    Headache. Non-effective  . Tramadol Other (See Comments)    Headache. Non-effective.  . NMarland Kitchenxium [Esomeprazole Magnesium] Diarrhea    Abdominal pain    Current Outpatient Medications  Medication Sig Dispense Refill  . albuterol (VENTOLIN HFA) 108 (90 Base) MCG/ACT inhaler INHALE 2 PUFFS INTO LUNGS EVERY 4 HOURS AS NEEDED FOR WHEEZING 18 each 2  . amLODipine (NORVASC) 5 MG tablet TAKE ONE TABLET BY MOUTH ONCE DAILY 90 tablet 3  . budesonide-formoterol (SYMBICORT) 80-4.5 MCG/ACT inhaler Inhale 2 puffs into the lungs 2 (two) times daily. 1 Inhaler 11  . calcium carbonate (TUMS - DOSED IN MG ELEMENTAL CALCIUM) 500 MG chewable tablet Chew 1-2 tablets by mouth 2 (two) times daily as  needed for indigestion or heartburn.    . cholestyramine (QUESTRAN) 4 GM/DOSE powder DISSOLVE AND TAKE 2 GRAMS BY MOUTH TWICE DAILY WITH A MEAL - DO NOT TAKE WITHIN 2 HOURS OF OTHER MEDICATIONS 378 g 3  . dexlansoprazole (DEXILANT) 60 MG capsule 1 PO EVERY MORNING WITH BREAKFAST. (Patient taking differently: Take 60 mg by mouth daily with breakfast. ) 30 capsule 11  . diazepam (VALIUM) 5 MG tablet TAKE 1 TABLET BY MOUTH EVERY 12 HOURS AS NEEDED FOR  ANXIETY (MUST  LAST  30  DAYS) 30 tablet 2  . fluticasone (FLONASE) 50 MCG/ACT nasal spray USE TWO SPRAY(S) IN EACH NOSTRIL ONCE DAILY AS NEEDED FOR ALLERGY OR RHINITIS 16 g 0  . gabapentin (NEURONTIN) 400 MG capsule Take 400 mg by mouth 3 (three) times daily.     Marland Kitchen losartan-hydrochlorothiazide (HYZAAR) 50-12.5 MG tablet Take 1 tablet by mouth daily. (Patient taking differently: Take 1 tablet by mouth every morning. 1100) 30 tablet 3  . nystatin (MYCOSTATIN) 100000 UNIT/ML suspension TAKE  5 ML BY MOUTH 4 TIMES DAILY AS NEEDED 500 mL 0  . oxyCODONE-acetaminophen (PERCOCET) 10-325 MG tablet Take 1 tablet by mouth 3 (three) times daily.     Marland Kitchen POTASSIUM PO Take 50 mg by mouth daily. OTC    . triamcinolone ointment (KENALOG) 0.5 % Apply 1 application topically 2 (two) times daily. 30 g 2   No current facility-administered medications for this visit.      Physical Exam  Blood pressure 117/78, pulse 83, temperature (!) 97.4 F (36.3 C), height 4' 11"  (1.499 m), weight 102 lb (46.3 kg).  Constitutional: overall normal hygiene, normal nutrition, well developed, normal grooming, normal body habitus. Assistive device:none  Musculoskeletal: gait and station Limp none, muscle tone and strength are normal, no tremors or atrophy is present.  .  Neurological: coordination overall normal.  Deep tendon reflex/nerve stretch intact.  Sensation normal.  Cranial nerves II-XII intact.   Skin:   Normal overall no scars, lesions, ulcers or rashes. No  psoriasis.  Psychiatric: Alert and oriented x 3.  Recent memory intact, remote memory unclear.  Normal mood and affect. Well groomed.  Good eye contact.  Cardiovascular: overall no swelling, no varicosities, no edema bilaterally, normal temperatures of the legs and arms, no clubbing, cyanosis and good capillary refill.  Lymphatic: palpation is normal.  Examination of right Upper Extremity is done.  Inspection:   Overall:  Elbow non-tender without crepitus or defects, forearm non-tender without crepitus or defects, wrist non-tender without crepitus or defects, hand non-tender.    Shoulder: with glenohumeral joint tenderness, without effusion.   Upper arm: without swelling and tenderness   Range of motion:   Overall:  Full range of motion of the elbow, full range of motion of wrist and full range of motion in fingers.   Shoulder:  right  180 degrees forward flexion; 165 degrees abduction; 35 degrees internal rotation, 35 degrees external rotation, 20 degrees extension, 40 degrees adduction.   Stability:   Overall:  Shoulder, elbow and wrist stable   Strength and Tone:   Overall full shoulder muscles strength, full upper arm strength and normal upper arm bulk and tone.  All other systems reviewed and are negative   The patient has been educated about the nature of the problem(s) and counseled on treatment options.  The patient appeared to understand what I have discussed and is in agreement with it.  Encounter Diagnosis  Name Primary?  . Pain in joint of right shoulder Yes    PLAN Call if any problems.  Precautions discussed.  Continue current medications.   Return to clinic 3 weeks   Electronically Signed Sanjuana Kava, MD 1/31/201911:17 AM

## 2017-06-05 NOTE — Progress Notes (Deleted)
Patient [name] [name], female DOB:February 08, 1956, 62 y.o. JAS:[id number]  Chief Complaint  Patient presents with  . Follow-up    Recheck on right shoulder.    HPI  [name] [name] is a 62 y.o. female *** HPI  Body mass index is 20.6 kg/m.  ROS  Review of Systems  Past Medical History:  Diagnosis Date  . Allergic rhinitis   . Anastomotic ulcer JAN 2009  . Anxiety   . Asthma   . BMI (body mass index) 20.0-29.9 2009 121 lbs  . Concussion   . 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 Crohn disease ([place])   . Crohn disease ([place])   . CTS (carpal tunnel syndrome)   . Diarrhea MULITIFACTORIAL   IBS, LACTOSE INTOLERANCE, SBBO, BILE-SALT  . Elevated liver enzymes 2009: BMI 24 ?Etoh ALT 94, AST 51,  NEG ANA, qIGs& ASMA   MAY 2012 AST 52 ALT 38  . Esophageal stricture 2009  . GERD (gastroesophageal reflux disease)   . Hemorrhoid   . Hyperlipemia   . Hypertension   . Inflammatory bowel disease 2006 CD   SURGICAL REMISSION  . LBP (low back pain)   . Mental disorder   . Migraine   . PONV (postoperative nausea and vomiting)   . Shortness of breath    exertion and humidity  . Shoulder pain    right  . Sleep apnea    Stop [name]    Past Surgical History:  Procedure Laterality Date  . BACK SURGERY    . BILATERAL SALPINGOOPHORECTOMY    . BIOPSY  12/24/2011   Procedure: BIOPSY;  Surgeon: Danie Binder, MD;  Location: [facility];  Service: Endoscopy;;  Gastric Biopsies  . BIOPSY N/A 06/30/2012   Procedure: BIOPSY;  Surgeon: Danie Binder, MD;  Location: [facility];  Service: Endoscopy;  Laterality: N/A;  Gastric and Esophageal Biopsies  . CARPAL TUNNEL RELEASE  LEFT  . CATARACT EXTRACTION W/PHACO Right 07/30/2016   Procedure: CATARACT EXTRACTION PHACO AND INTRAOCULAR LENS PLACEMENT (IOC);  Surgeon: Rutherford Guys, MD;  Location: [facility];  Service: Ophthalmology;  Laterality: Right;  CDE: 5.45  . CATARACT EXTRACTION W/PHACO Left 08/13/2016   Procedure: CATARACT EXTRACTION PHACO  AND INTRAOCULAR LENS PLACEMENT (IOC);  Surgeon: Rutherford Guys, MD;  Location: [facility];  Service: Ophthalmology;  Laterality: Left;  CDE: 5.59  . COLON SURGERY    . COLONOSCOPY  JAN 2009 DIARRHEA, ABD PAIN, BRBPR   ANASTOMOTIC ULCER(3MM), IH AL:PFXTKW ULCER, NL COLON bX  . DILATION AND CURETTAGE OF UTERUS    . ESOPHAGEAL DILATION  12/24/2011   SLF: A stricture was found in the distal esophagus/ Moderate gastritis/ MILD Duodenitis  . ESOPHAGOGASTRODUODENOSCOPY  02/07/09   mild gastritis  . ESOPHAGOGASTRODUODENOSCOPY (EGD) WITH PROPOFOL N/A 06/30/2012   SLF: 1. No definite stricture appreciated 2. small hiatal hernia 3. Gastritis  . ESOPHAGOGASTRODUODENOSCOPY (EGD) WITH PROPOFOL N/A 02/20/2016   Procedure: ESOPHAGOGASTRODUODENOSCOPY (EGD) WITH PROPOFOL;  Surgeon: Danie Binder, MD;  [facility];  Service: Endoscopy;  Laterality: N/A;  930  . gum flap Bilateral   . HEMICOLECTOMY  RIGHT 2006  . HERNIA REPAIR    . LUMBAR DISC SURGERY    . SAVORY DILATION N/A 06/30/2012   Procedure: SAVORY DILATION;  Surgeon: Danie Binder, MD;  Location: [facility];  Service: Endoscopy;  Laterality: N/A;  12.52m , 179m 1559m. SAVORY DILATION N/A 02/20/2016   Procedure: SAVORY DILATION;  Surgeon: SanMarga Melnick  Fields, MD;  [facility];  Service: Endoscopy;  Laterality: N/A;  . UPPER GASTROINTESTINAL ENDOSCOPY  JAN 2009 ABD PAIN   GASTRITIS, ESO RING  . UPPER GASTROINTESTINAL ENDOSCOPY  OCT 2010 ABD PAIN, DYSPHAGIA   DIL 15MM, GASTRITIS, NL DUODENUM  . UPPER GASTROINTESTINAL ENDOSCOPY  OCT 2010 DYSPHAGIA   DIL 17 MM, GASTRITIS, NL DUODENUM  . vocal cord surgery  Sep 20, 2010   precancerous areas removed  . wisdom tooth extraction Right    pin placement due to broken jaw    Family History  Problem Relation Age of Onset  . Hypothyroidism Mother   . Heart disease Father   . Parkinson's disease Father   . Hypothyroidism Daughter   . Heart attack Paternal Grandfather   . Alzheimer's disease  Paternal Grandmother   . Heart attack Maternal Grandfather   . Crohn's disease Sister   . Other Sister        was murdered  . Crohn's disease Maternal Uncle   . Colon cancer Neg Hx   . Colon polyps Neg Hx     Social History Social History   Tobacco Use  . Smoking status: Former Smoker    Packs/day: 0.00    Years: 30.00    Pack years: 0.00    Types: Cigarettes    Last attempt to quit: 01/28/2016    Years since quitting: 1.3  . Smokeless tobacco: Never Used  Substance Use Topics  . Alcohol use: Yes    Comment: "beer here and there"  . Drug use: No    Allergies  Allergen Reactions  . Lovastatin Other (See Comments)    Chest pain  . Naproxen Other (See Comments)    Chest pain/ SOB   . Toradol [Ketorolac Tromethamine] Other (See Comments)    Headache. Non-effective  . Tramadol Other (See Comments)    Headache. Non-effective.  Marland Kitchen Nexium [Esomeprazole Magnesium] Diarrhea    Abdominal pain    Current Outpatient Medications  Medication Sig Dispense Refill  . albuterol (VENTOLIN HFA) 108 (90 Base) MCG/ACT inhaler INHALE 2 PUFFS INTO LUNGS EVERY 4 HOURS AS NEEDED FOR WHEEZING 18 each 2  . amLODipine (NORVASC) 5 MG tablet TAKE ONE TABLET BY MOUTH ONCE DAILY 90 tablet 3  . budesonide-formoterol (SYMBICORT) 80-4.5 MCG/ACT inhaler Inhale 2 puffs into the lungs 2 (two) times daily. 1 Inhaler 11  . calcium carbonate (TUMS - DOSED IN MG ELEMENTAL CALCIUM) 500 MG chewable tablet Chew 1-2 tablets by mouth 2 (two) times daily as needed for indigestion or heartburn.    . cholestyramine (QUESTRAN) 4 GM/DOSE powder DISSOLVE AND TAKE 2 GRAMS BY MOUTH TWICE DAILY WITH A MEAL - DO NOT TAKE WITHIN 2 HOURS OF OTHER MEDICATIONS 378 g 3  . dexlansoprazole (DEXILANT) 60 MG capsule 1 PO EVERY MORNING WITH BREAKFAST. (Patient taking differently: Take 60 mg by mouth daily with breakfast. ) 30 capsule 11  . diazepam (VALIUM) 5 MG tablet TAKE 1 TABLET BY MOUTH EVERY 12 HOURS AS NEEDED FOR ANXIETY (MUST   LAST  30  DAYS) 30 tablet 2  . fluticasone (FLONASE) 50 MCG/ACT nasal spray USE TWO SPRAY(S) IN EACH NOSTRIL ONCE DAILY AS NEEDED FOR ALLERGY OR RHINITIS 16 g 0  . gabapentin (NEURONTIN) 400 MG capsule Take 400 mg by mouth 3 (three) times daily.     Marland Kitchen losartan-hydrochlorothiazide (HYZAAR) 50-12.5 MG tablet Take 1 tablet by mouth daily. (Patient taking differently: Take 1 tablet by mouth every morning. 1100) 30 tablet 3  .  nystatin (MYCOSTATIN) 100000 UNIT/ML suspension TAKE  5 ML BY MOUTH 4 TIMES DAILY AS NEEDED 500 mL 0  . oxyCODONE-acetaminophen (PERCOCET) 10-325 MG tablet Take 1 tablet by mouth 3 (three) times daily.     Marland Kitchen POTASSIUM PO Take 50 mg by mouth daily. OTC    . triamcinolone ointment (KENALOG) 0.5 % Apply 1 application topically 2 (two) times daily. 30 g 2   No current facility-administered medications for this visit.      Physical Exam  Blood pressure 117/78, pulse 83, temperature (!) 97.4 F (36.3 C), height 4' 11"  (1.499 m), weight 102 lb (46.3 kg).  Constitutional: overall normal hygiene, normal nutrition, well developed, normal grooming, normal body habitus. Assistive device:{devices; assistance:12929}  Musculoskeletal: gait and station Limp {NONE:21772}, muscle tone and strength are normal, no tremors or atrophy is present.  .  Neurological: coordination overall normal.  Deep tendon reflex/nerve stretch intact.  Sensation normal.  Cranial nerves II-XII intact.   Skin:   Normal overall no scars, lesions, ulcers or rashes. No psoriasis.  Psychiatric: Alert and oriented x 3.  Recent memory intact, remote memory unclear.  Normal mood and affect. Well groomed.  Good eye contact.  Cardiovascular: overall no swelling, no varicosities, no edema bilaterally, normal temperatures of the legs and arms, no clubbing, cyanosis and good capillary refill.  Lymphatic: palpation is normal.  All other systems reviewed and are negative   The patient has been educated about the nature  of the problem(s) and counseled on treatment options.  The patient appeared to understand what I have discussed and is in agreement with it.  Encounter Diagnosis  Name Primary?  . Pain in joint of right shoulder Yes    PLAN Call if any problems.  Precautions discussed.  Continue current medications.   Return to clinic {TIME; 1 WEEK TO 3 MONTHS:[phone]}   ***

## 2017-06-06 ENCOUNTER — Other Ambulatory Visit: Payer: Self-pay | Admitting: Family Medicine

## 2017-06-16 ENCOUNTER — Other Ambulatory Visit: Payer: Self-pay | Admitting: Family Medicine

## 2017-06-24 ENCOUNTER — Other Ambulatory Visit: Payer: Self-pay | Admitting: Gastroenterology

## 2017-06-25 ENCOUNTER — Encounter: Payer: Self-pay | Admitting: Family Medicine

## 2017-06-25 ENCOUNTER — Ambulatory Visit (INDEPENDENT_AMBULATORY_CARE_PROVIDER_SITE_OTHER): Payer: Medicare Other | Admitting: Family Medicine

## 2017-06-25 VITALS — BP 128/78 | HR 84 | Temp 97.9°F | Resp 16 | Ht 59.0 in | Wt 102.4 lb

## 2017-06-25 DIAGNOSIS — I10 Essential (primary) hypertension: Secondary | ICD-10-CM | POA: Diagnosis not present

## 2017-06-25 DIAGNOSIS — Z1231 Encounter for screening mammogram for malignant neoplasm of breast: Secondary | ICD-10-CM | POA: Diagnosis not present

## 2017-06-25 DIAGNOSIS — M810 Age-related osteoporosis without current pathological fracture: Secondary | ICD-10-CM

## 2017-06-25 DIAGNOSIS — F17200 Nicotine dependence, unspecified, uncomplicated: Secondary | ICD-10-CM | POA: Insufficient documentation

## 2017-06-25 DIAGNOSIS — E782 Mixed hyperlipidemia: Secondary | ICD-10-CM | POA: Diagnosis not present

## 2017-06-25 DIAGNOSIS — Z1239 Encounter for other screening for malignant neoplasm of breast: Secondary | ICD-10-CM

## 2017-06-25 DIAGNOSIS — J410 Simple chronic bronchitis: Secondary | ICD-10-CM | POA: Diagnosis not present

## 2017-06-25 DIAGNOSIS — F411 Generalized anxiety disorder: Secondary | ICD-10-CM | POA: Diagnosis not present

## 2017-06-25 DIAGNOSIS — Z0000 Encounter for general adult medical examination without abnormal findings: Secondary | ICD-10-CM

## 2017-06-25 DIAGNOSIS — Z720 Tobacco use: Secondary | ICD-10-CM

## 2017-06-25 NOTE — Patient Instructions (Addendum)
F/U 6 months  Schedule your mammogram in the next few months  F/u with Dr. Oneida Alar about your colonoscopy  No tobacco, avoid alcohol

## 2017-06-25 NOTE — Progress Notes (Signed)
Subjective:   Patient presents for Medicare Annual/Subsequent preventive examination.   Review Past Medical/Family/Social: Per EMR  Has been having dental work done, she had some type of implant put in last week which is making her slur her speech she is only able to eat soft foods.  She is going back this evening for permanent dental fixtures.  She took Valium before she came to the visit states that she was nervous and she knew she would have the procedure she is slurring her speech and seems drowsy during the visit.  I did ask about tobacco and alcohol use she states that she is smoking 4 cigarettes a day and she occasionally has some wine.  She did have wine last night.  She has not had any recent illness. Since her last visit she has had cataract surgery.  She is also been following with orthopedics secondary to right shoulder injury she has had 2 steroid injections which is helped her range of motion.  He had to delay her Reclast because of the medication she was on from [place] per report . He also has not had her mammogram and she cannot raise her arm to do the procedure we will plan to schedule this in the next coming months.  He still following with pain clinic she states that they told her to take the Valium 3 times a day though it is not ripe this way she only gets 30 tablets/month.  She is also on gabapentin and oxycodone acetaminophen   Risk Factors  Current exercise habits: None Dietary issues discussed: Yes  Cardiac risk factors: HTN, hyperlipidemia   Depression Screen  (Note: if answer to either of the following is "Yes", a more complete depression screening is indicated)  Over the past two weeks, have you felt down, depressed or hopeless? No Over the past two weeks, have you felt little interest or pleasure in doing things? No Have you lost interest or pleasure in daily life? No Do you often feel hopeless? No Do you cry easily over simple problems? No   Activities of  Daily Living  In your present state of health, do you have any difficulty performing the following activities?:  Driving? No  Managing money? No  Feeding yourself? No  Getting from bed to chair? No  Climbing a flight of stairs? No  Preparing food and eating?: No  Bathing or showering? No  Getting dressed: No  Getting to the toilet? No  Using the toilet:No  Moving around from place to place: No  In the past year have you fallen or had a near fall?:No  Are you sexually active? No  Do you have more than one partner? No   Hearing Difficulties: No  Do you often ask people to speak up or repeat themselves? No  Do you experience ringing or noises in your ears? No Do you have difficulty understanding soft or whispered voices? No  Do you feel that you have a problem with memory? No Do you often misplace items? No  Do you feel safe at home? Yes  Cognitive Testing  Alert? Yes Normal Appearance?Yes  Oriented to person? Yes Place? Yes  Time? Yes  Recall of three objects? Yes  Can perform simple calculations? Yes  Displays appropriate judgment?Yes  Can read the correct time from a watch face?Yes   List the Names of Other Physician/Practitioners you currently use:  Orthopedics , Gastroenterology , Neurology, [facility]  Oral surgeon - Dr. Mariel Craft  Screening Tests / Date Colonoscopy  Due                    Zostavax - UTD Mammogram - Due   Bone Density - DUE Sept  2019  Osteoporosis  Influenza Vaccine UTD Tetanus/tdap UTD Pneumonia- UTD  ROS: GEN- denies fatigue, fever, weight loss,weakness, recent illness HEENT- denies eye drainage, change in vision, nasal discharge, CVS- denies chest pain, palpitations RESP- denies SOB, cough, wheeze ABD- denies N/V, change in stools, abd pain GU- denies dysuria, hematuria, dribbling, incontinence MSK- denies joint pain, muscle aches, injury Neuro- denies headache, dizziness, syncope, seizure activity  PHYSICAL: GEN-  NAD, alert and oriented x3 HEENT- PERRL, EOMI, non injected sclera, pink conjunctiva, MMM, oropharynx clear, swelling upper gumline, TM clear bilat Neck- Supple, no thryomegaly CVS- RRR, no murmur RESP-CTAB ABD-NABS,soft,NT,ND Neuro- drowsy appearing, slurring speech, but normal responses to questions, slower gait   EXT- No edema Pulses- Radial, DP- 2+   Assessment:    Annual wellness medicare exam   Plan:    During the course of the visit the patient was educated and counseled about appropriate screening and preventive services including:   Screening mammogram- pt to schedule  Osteoporosis needs repeat Bone density in Sept 2019, did not get Reclast this past fall   GAD- Continue valium as needed discussed not to take with ETOH, she does not take while driving, her husband drove her today.   I have never seen her like this.  I think it is a combination of already having difficulty speaking because of the dental procedure which she will complete this evening and then have the volume in her system which is causing her to slurred speech even more and it is also making her a little drowsy.  She declines having any alcohol this morning states that she rarely drinks wine.  Advised that she should not be drinking alcohol with her medications in general.  HTN- controlled with bp meds  Hyperlipidemia- on questran, due for lipids  Fasting labs done   Colonoscopy- f/u with her GI   Discussed the importance of tobacco cessation  Follow-up with her pain physician  COPD appears to be at baseline continue Symbicort albuterol as rescue inhaler  Diet review for nutrition referral? Yes ____ Not Indicated __x__  Patient Instructions (the written plan) was given to the patient.  Medicare Attestation  I have personally reviewed:  The patient's medical and social history  Their use of alcohol, tobacco or illicit drugs  Their current medications and supplements  The patient's functional  ability including ADLs,fall risks, home safety risks, cognitive, and hearing and visual impairment  Diet and physical activities  Evidence for depression or mood disorders  The patient's weight, height, BMI, and visual acuity have been recorded in the chart. I have made referrals, counseling, and provided education to the patient based on review of the above and I have provided the patient with a written personalized care plan for preventive services.

## 2017-06-26 ENCOUNTER — Ambulatory Visit: Payer: Medicare Other | Admitting: Orthopaedic Surgery

## 2017-06-26 LAB — CBC WITH DIFFERENTIAL/PLATELET
Basophils Absolute: 39 cells/uL (ref 0–200)
Basophils Relative: 0.9 %
Eosinophils Absolute: 60 cells/uL (ref 15–500)
Eosinophils Relative: 1.4 %
HCT: 46 % — ABNORMAL HIGH (ref 35.0–45.0)
Hemoglobin: 16.5 g/dL — ABNORMAL HIGH (ref 11.7–15.5)
Lymphs Abs: 1316 cells/uL (ref 850–3900)
MCH: 36.3 pg — ABNORMAL HIGH (ref 27.0–33.0)
MCHC: 35.9 g/dL (ref 32.0–36.0)
MCV: 101.3 fL — ABNORMAL HIGH (ref 80.0–100.0)
MPV: 10.4 fL (ref 7.5–12.5)
Monocytes Relative: 12.1 %
Neutro Abs: 2365 cells/uL (ref 1500–7800)
Neutrophils Relative %: 55 %
Platelets: 175 10*3/uL (ref 140–400)
RBC: 4.54 10*6/uL (ref 3.80–5.10)
RDW: 12.8 % (ref 11.0–15.0)
Total Lymphocyte: 30.6 %
WBC mixed population: 520 cells/uL (ref 200–950)
WBC: 4.3 10*3/uL (ref 3.8–10.8)

## 2017-06-26 LAB — COMPREHENSIVE METABOLIC PANEL
AG RATIO: 1.7 (calc) (ref 1.0–2.5)
ALBUMIN MSPROF: 4.5 g/dL (ref 3.6–5.1)
ALKALINE PHOSPHATASE (APISO): 106 U/L (ref 33–130)
ALT: 32 U/L — ABNORMAL HIGH (ref 6–29)
AST: 56 U/L — AB (ref 10–35)
BUN / CREAT RATIO: 13 (calc) (ref 6–22)
BUN: 5 mg/dL — ABNORMAL LOW (ref 7–25)
CHLORIDE: 102 mmol/L (ref 98–110)
CO2: 24 mmol/L (ref 20–32)
CREATININE: 0.4 mg/dL — AB (ref 0.50–1.05)
Calcium: 9.4 mg/dL (ref 8.6–10.4)
GLOBULIN: 2.6 g/dL (ref 1.9–3.7)
Glucose, Bld: 80 mg/dL (ref 65–99)
Potassium: 3.8 mmol/L (ref 3.5–5.3)
Sodium: 140 mmol/L (ref 135–146)
Total Bilirubin: 0.6 mg/dL (ref 0.2–1.2)
Total Protein: 7.1 g/dL (ref 6.1–8.1)

## 2017-06-26 LAB — LIPID PANEL
Cholesterol: 246 mg/dL — ABNORMAL HIGH (ref <200)
HDL: 149 mg/dL (ref >50)
LDL Cholesterol (Calc): 81 mg/dL (calc)
Non-HDL Cholesterol (Calc): 97 mg/dL (calc) (ref <130)
Total CHOL/HDL Ratio: 1.7 (calc) (ref <5.0)
Triglycerides: 82 mg/dL (ref <150)

## 2017-06-30 DIAGNOSIS — R1084 Generalized abdominal pain: Secondary | ICD-10-CM | POA: Diagnosis not present

## 2017-06-30 DIAGNOSIS — Z79891 Long term (current) use of opiate analgesic: Secondary | ICD-10-CM | POA: Diagnosis not present

## 2017-06-30 DIAGNOSIS — M542 Cervicalgia: Secondary | ICD-10-CM | POA: Diagnosis not present

## 2017-06-30 DIAGNOSIS — M545 Low back pain: Secondary | ICD-10-CM | POA: Diagnosis not present

## 2017-07-11 ENCOUNTER — Telehealth: Payer: Self-pay | Admitting: *Deleted

## 2017-07-11 NOTE — Telephone Encounter (Signed)
Received call from patient.   Reports that she is having issues with her vagina. States that she has zit like area that is painful.   MD reports that she should return to OB GYN.                     Call placed to patient and patient made aware.

## 2017-07-11 NOTE — Telephone Encounter (Signed)
Patient states she has an area like she had before and has some cream left over and wants to use it.  Advised patient that would be fine and to let us know if no improvement after using the cream. Verbalized understanding.

## 2017-07-18 DIAGNOSIS — M79671 Pain in right foot: Secondary | ICD-10-CM | POA: Diagnosis not present

## 2017-07-18 DIAGNOSIS — M722 Plantar fascial fibromatosis: Secondary | ICD-10-CM | POA: Diagnosis not present

## 2017-08-12 ENCOUNTER — Other Ambulatory Visit: Payer: Self-pay | Admitting: Family Medicine

## 2017-08-13 NOTE — Telephone Encounter (Signed)
Ok to refill??  Last office visit 06/25/2017.  Last refill 05/13/2017, #2 refills.

## 2017-08-21 ENCOUNTER — Telehealth: Payer: Self-pay | Admitting: *Deleted

## 2017-08-21 NOTE — Telephone Encounter (Signed)
Call placed to patient and patient made aware.   Patient highly upset by denial. MD aware.

## 2017-08-21 NOTE — Telephone Encounter (Signed)
Received call from patient.   Reports that she has been having increased pain from dental work and she was taking pain medications QID instead of TID as ordered from Melvia Heaps, NP pain management. States that she is completely out of medications at this time.   States that she was advised to contact PCP for temporary supply of pain medication until 08/30/2017. Patient reports that she has paper prescription that can be filled on 08/30/2017.  Call placed to [facility] [phone] telephone. Spoke with Raquel Sarna, OM with [facility]. Was advised that early refill was denied. Clonidine and Hydroxyzine was sent to pharmacy for withdrawal Sx. States that she was advised to contact either PCP or dentist to prescribe temporary supply.   MD please advise.

## 2017-08-21 NOTE — Telephone Encounter (Signed)
I do not fill her pain meds, with new laws needs to get from her prescriber

## 2017-08-29 ENCOUNTER — Other Ambulatory Visit: Payer: Self-pay | Admitting: Family Medicine

## 2017-09-03 ENCOUNTER — Telehealth: Payer: Self-pay | Admitting: *Deleted

## 2017-09-03 NOTE — Telephone Encounter (Signed)
Okay to place referral I can not guareentee insurance will pay since she is out of state

## 2017-09-03 NOTE — Telephone Encounter (Signed)
Received call from patient.   Reports that she is having increased vertigo from concussion and is in [place] on vacation with daughter until Sep 17, 2017. Reports that daughter has vertigo as well and has seen specialist in [place].   States that she  would like to have referral sent to Dr. Lonell Grandchild, DPT in [facility] who specializes in vertigo. [phone]~ telephone/ [phone]~ fax.

## 2017-09-03 NOTE — Telephone Encounter (Signed)
Reviewed with MD.   States that she cannot refer to PT in [place]. States that patient would need to be seen by neuro first and then referred to PT. Patient will most likely not be in [place] long enough for referral to be done.   Call placed to patient and patient made aware.

## 2017-09-24 DIAGNOSIS — M542 Cervicalgia: Secondary | ICD-10-CM | POA: Diagnosis not present

## 2017-09-24 DIAGNOSIS — Z79891 Long term (current) use of opiate analgesic: Secondary | ICD-10-CM | POA: Diagnosis not present

## 2017-09-24 DIAGNOSIS — M545 Low back pain: Secondary | ICD-10-CM | POA: Diagnosis not present

## 2017-09-24 DIAGNOSIS — R1084 Generalized abdominal pain: Secondary | ICD-10-CM | POA: Diagnosis not present

## 2017-10-08 ENCOUNTER — Other Ambulatory Visit: Payer: Self-pay | Admitting: Family Medicine

## 2017-10-08 NOTE — Telephone Encounter (Signed)
Ok to refill??  Last office visit  06/25/2017.  Last refill 08/13/2017, #2 refills.

## 2017-10-21 DIAGNOSIS — Z79891 Long term (current) use of opiate analgesic: Secondary | ICD-10-CM | POA: Diagnosis not present

## 2017-10-21 DIAGNOSIS — M542 Cervicalgia: Secondary | ICD-10-CM | POA: Diagnosis not present

## 2017-10-21 DIAGNOSIS — M545 Low back pain: Secondary | ICD-10-CM | POA: Diagnosis not present

## 2017-10-21 DIAGNOSIS — R1084 Generalized abdominal pain: Secondary | ICD-10-CM | POA: Diagnosis not present

## 2017-10-22 ENCOUNTER — Other Ambulatory Visit: Payer: Self-pay | Admitting: Family Medicine

## 2017-10-27 ENCOUNTER — Other Ambulatory Visit: Payer: Self-pay | Admitting: Family Medicine

## 2017-11-04 DIAGNOSIS — M779 Enthesopathy, unspecified: Secondary | ICD-10-CM | POA: Diagnosis not present

## 2017-11-04 DIAGNOSIS — M79671 Pain in right foot: Secondary | ICD-10-CM | POA: Diagnosis not present

## 2017-11-04 DIAGNOSIS — M7741 Metatarsalgia, right foot: Secondary | ICD-10-CM | POA: Diagnosis not present

## 2017-12-09 DIAGNOSIS — M542 Cervicalgia: Secondary | ICD-10-CM | POA: Diagnosis not present

## 2017-12-09 DIAGNOSIS — R1084 Generalized abdominal pain: Secondary | ICD-10-CM | POA: Diagnosis not present

## 2017-12-09 DIAGNOSIS — G894 Chronic pain syndrome: Secondary | ICD-10-CM | POA: Diagnosis not present

## 2017-12-09 DIAGNOSIS — Z79891 Long term (current) use of opiate analgesic: Secondary | ICD-10-CM | POA: Diagnosis not present

## 2017-12-23 ENCOUNTER — Ambulatory Visit (INDEPENDENT_AMBULATORY_CARE_PROVIDER_SITE_OTHER): Payer: Medicare Other | Admitting: Family Medicine

## 2017-12-23 ENCOUNTER — Ambulatory Visit: Payer: Medicare Other | Admitting: Family Medicine

## 2017-12-23 ENCOUNTER — Encounter: Payer: Self-pay | Admitting: Family Medicine

## 2017-12-23 ENCOUNTER — Other Ambulatory Visit: Payer: Self-pay

## 2017-12-23 VITALS — BP 118/64 | HR 82 | Temp 98.8°F | Resp 16 | Ht 59.0 in | Wt 103.0 lb

## 2017-12-23 DIAGNOSIS — Z79899 Other long term (current) drug therapy: Secondary | ICD-10-CM | POA: Diagnosis not present

## 2017-12-23 DIAGNOSIS — E782 Mixed hyperlipidemia: Secondary | ICD-10-CM

## 2017-12-23 DIAGNOSIS — D751 Secondary polycythemia: Secondary | ICD-10-CM

## 2017-12-23 DIAGNOSIS — F1010 Alcohol abuse, uncomplicated: Secondary | ICD-10-CM | POA: Insufficient documentation

## 2017-12-23 DIAGNOSIS — Z789 Other specified health status: Secondary | ICD-10-CM | POA: Diagnosis not present

## 2017-12-23 DIAGNOSIS — I10 Essential (primary) hypertension: Secondary | ICD-10-CM | POA: Diagnosis not present

## 2017-12-23 DIAGNOSIS — Z7289 Other problems related to lifestyle: Secondary | ICD-10-CM

## 2017-12-23 DIAGNOSIS — J410 Simple chronic bronchitis: Secondary | ICD-10-CM | POA: Diagnosis not present

## 2017-12-23 DIAGNOSIS — F411 Generalized anxiety disorder: Secondary | ICD-10-CM

## 2017-12-23 DIAGNOSIS — K50019 Crohn's disease of small intestine with unspecified complications: Secondary | ICD-10-CM | POA: Diagnosis not present

## 2017-12-23 DIAGNOSIS — Z720 Tobacco use: Secondary | ICD-10-CM | POA: Diagnosis not present

## 2017-12-23 NOTE — Assessment & Plan Note (Signed)
She is on Valium.  We will see what her drug screen looks like will likely have to decrease her taper off this medication slowly especially if she is drinking heavily so that she is not going to DTs

## 2017-12-23 NOTE — Assessment & Plan Note (Signed)
Commend that she call her gastroenterologist that she continues to have problems with her bowels.  I am more concerned about her alcohol use I think that she drinks more than she is letting on.    Withdrawal a urine drug screen today as she does get Valium from my office as well as an alcohol level.

## 2017-12-23 NOTE — Patient Instructions (Addendum)
F/U 6 months for Physical  Please stop drinking and smoking

## 2017-12-23 NOTE — Assessment & Plan Note (Signed)
Check her hemoglobin levels and platelets.

## 2017-12-23 NOTE — Progress Notes (Addendum)
Subjective:    Patient ID: [name] [name], female    DOB: Oct 10, 1955, 62 y.o.   MRN: [id number]  Patient presents for Follow-up (is not fasting)   Pt here to f/u as chronic medical problems     States both her and husband have been sick with GI problems. States it was due to backed up plumbing and gases into water .  She initially thought that she might have.  Her diarrhea has improved but she never called to follow-up with her gastroenterologist in the setting of her Crohn's     She is following with Pain clinic and GI    HTN- taking BP medicine as prescribed    COPD- taking symbicort randomly, but using  albuterol every day, continues to smoke    Polycythemia due for repeat CBC, Hb last 16.5  Hyperlipidemia- cholesterol has been improving on last check only TC were elevated at 246  Patient smells of alcohol during the visit.  She initially states that she had 2 glasses of wine last night advised her that she still smell to strongly.  She then states that she drank some moonshine that she got from the plumber to have been to her house for her water backing up.  States that she is not drink very much alcohol normally.   Review Of Systems:  GEN- denies fatigue, fever, weight loss,weakness, recent illness HEENT- denies eye drainage, change in vision, nasal discharge, CVS- denies chest pain, palpitations RESP- denies SOB, cough, wheeze ABD- denies N/V, change in stools, abd pain GU- denies dysuria, hematuria, dribbling, incontinence MSK- denies joint pain, muscle aches, injury Neuro- denies headache, dizziness, syncope, seizure activity       Objective:    BP 118/64   Pulse 82   Temp 98.8 F (37.1 C) (Oral)   Resp 16   Ht 4' 11"  (1.499 m)   Wt 103 lb (46.7 kg)   SpO2 97%   BMI 20.80 kg/m  GEN- NAD, alert and oriented x3, smells  of alcohol HEENT- PERRL, EOMI, non injected sclera, pink conjunctiva, MMM, oropharynx clear Neck- Supple, no thyromegaly CVS- RRR, no  murmur RESP-CTAB ABD-NABS,soft,NT,ND EXT- No edema Pulses- Radial, DP- 2+        Assessment & Plan:      Problem List Items Addressed This Visit      Unprioritized   Alcohol use   Anxiety state    She is on Valium.  We will see what her drug screen looks like will likely have to decrease her taper off this medication slowly especially if she is drinking heavily so that she is not going to DTs      Chronic bronchitis ([place])    Again discussed the proper way of using her inhaler she is to use the record daily the albuterol only for exacerbations.  Also discussed tobacco cessation which she is not ready to quit completely.      Crohn's disease of small intestine with complication [facility])    Commend that she call her gastroenterologist that she continues to have problems with her bowels.  I am more concerned about her alcohol use I think that she drinks more than she is letting on.    Withdrawal a urine drug screen today as she does get Valium from my office as well as an alcohol level.      Relevant Orders   Comprehensive metabolic panel   Essential hypertension    Pressure is controlled no changes  Hyperlipidemia   Polycythemia - Primary    Check her hemoglobin levels and platelets.      Relevant Orders   CBC with Differential/Platelet   Comprehensive metabolic panel   Tobacco use    Other Visit Diagnoses    Long-term use of high-risk medication       Relevant Orders   Drugs of abuse screen w/o alc, rtn urine-sln   Ethanol, Clinical      Note: This dictation was prepared with Dragon dictation along with smaller phrase technology. Any transcriptional errors that result from this process are unintentional.

## 2017-12-23 NOTE — Assessment & Plan Note (Addendum)
Again discussed the proper way of using her inhaler she is to use the record daily the albuterol only for exacerbations.  Also discussed tobacco cessation which she is not ready to quit completely.

## 2017-12-23 NOTE — Assessment & Plan Note (Signed)
Pressure is controlled no changes

## 2017-12-24 LAB — DRUGS OF ABUSE SCREEN W/O ALC, ROUTINE URINE
AMPHETAMINES (1000 ng/mL SCRN): NEGATIVE
BARBITURATES: NEGATIVE
BENZODIAZEPINES: NEGATIVE
COCAINE METABOLITES: NEGATIVE
MARIJUANA MET (50 NG/ML SCRN): NEGATIVE
METHADONE: NEGATIVE
METHAQUALONE: NEGATIVE
OPIATES: NEGATIVE
PHENCYCLIDINE: NEGATIVE
PROPOXYPHENE: NEGATIVE

## 2017-12-25 LAB — ETHANOL, CLINICAL
Alcohol, Ethyl (B): 0.172 g/dL(%) — ABNORMAL HIGH
Alcohol, Ethyl (B): 172 mg/dL — ABNORMAL HIGH

## 2017-12-26 LAB — CBC WITH DIFFERENTIAL/PLATELET
BASOS ABS: 61 {cells}/uL (ref 0–200)
Basophils Relative: 1.1 %
EOS ABS: 28 {cells}/uL (ref 15–500)
Eosinophils Relative: 0.5 %
HCT: 41.9 % (ref 35.0–45.0)
HEMOGLOBIN: 14.8 g/dL (ref 11.7–15.5)
Lymphs Abs: 1590 cells/uL (ref 850–3900)
MCH: 36.5 pg — AB (ref 27.0–33.0)
MCHC: 35.3 g/dL (ref 32.0–36.0)
MCV: 103.5 fL — AB (ref 80.0–100.0)
MONOS PCT: 11.2 %
MPV: 10 fL (ref 7.5–12.5)
Neutro Abs: 3207 cells/uL (ref 1500–7800)
Neutrophils Relative %: 58.3 %
PLATELETS: 308 10*3/uL (ref 140–400)
RBC: 4.05 10*6/uL (ref 3.80–5.10)
RDW: 12 % (ref 11.0–15.0)
TOTAL LYMPHOCYTE: 28.9 %
WBC: 5.5 10*3/uL (ref 3.8–10.8)
WBCMIX: 616 {cells}/uL (ref 200–950)

## 2017-12-26 LAB — COMPREHENSIVE METABOLIC PANEL
AG RATIO: 1.8 (calc) (ref 1.0–2.5)
ALKALINE PHOSPHATASE (APISO): 90 U/L (ref 33–130)
ALT: 30 U/L — AB (ref 6–29)
AST: 42 U/L — AB (ref 10–35)
Albumin: 4.4 g/dL (ref 3.6–5.1)
BUN / CREAT RATIO: 11 (calc) (ref 6–22)
BUN: 5 mg/dL — ABNORMAL LOW (ref 7–25)
CHLORIDE: 103 mmol/L (ref 98–110)
CO2: 26 mmol/L (ref 20–32)
Calcium: 8.7 mg/dL (ref 8.6–10.4)
Creat: 0.47 mg/dL — ABNORMAL LOW (ref 0.50–0.99)
GLOBULIN: 2.4 g/dL (ref 1.9–3.7)
GLUCOSE: 93 mg/dL (ref 65–99)
Potassium: 4 mmol/L (ref 3.5–5.3)
Sodium: 140 mmol/L (ref 135–146)
Total Bilirubin: 0.4 mg/dL (ref 0.2–1.2)
Total Protein: 6.8 g/dL (ref 6.1–8.1)

## 2017-12-30 DIAGNOSIS — M7741 Metatarsalgia, right foot: Secondary | ICD-10-CM | POA: Diagnosis not present

## 2017-12-30 DIAGNOSIS — M779 Enthesopathy, unspecified: Secondary | ICD-10-CM | POA: Diagnosis not present

## 2017-12-30 DIAGNOSIS — M79671 Pain in right foot: Secondary | ICD-10-CM | POA: Diagnosis not present

## 2017-12-31 ENCOUNTER — Ambulatory Visit: Payer: Medicare Other | Admitting: Family Medicine

## 2017-12-31 ENCOUNTER — Ambulatory Visit: Payer: Self-pay | Admitting: Family Medicine

## 2018-01-02 ENCOUNTER — Other Ambulatory Visit: Payer: Self-pay

## 2018-01-02 ENCOUNTER — Encounter: Payer: Self-pay | Admitting: Family Medicine

## 2018-01-02 ENCOUNTER — Ambulatory Visit (INDEPENDENT_AMBULATORY_CARE_PROVIDER_SITE_OTHER): Payer: Medicare Other | Admitting: Family Medicine

## 2018-01-02 VITALS — BP 140/86 | HR 84 | Temp 97.7°F | Resp 14 | Ht 59.0 in | Wt 101.0 lb

## 2018-01-02 DIAGNOSIS — F411 Generalized anxiety disorder: Secondary | ICD-10-CM

## 2018-01-02 DIAGNOSIS — F1010 Alcohol abuse, uncomplicated: Secondary | ICD-10-CM | POA: Diagnosis not present

## 2018-01-02 DIAGNOSIS — Z818 Family history of other mental and behavioral disorders: Secondary | ICD-10-CM | POA: Diagnosis not present

## 2018-01-02 NOTE — Progress Notes (Signed)
Subjective:    Patient ID: [name] [name], female    DOB: 01/10/1956, 62 y.o.   MRN: [id number]  Patient presents for Follow-up (lab results) Patient here secondary to failed drug screen.  She smells of alcohol her last visit she admitted taking wine at night and also had moonshinethat morning. Fax to both her gastroenterologist as well as her pain physician.  Her urine drug screen came back negative for opiates as well as benzos her alcohol level was elevated at 172  She is prescribed percocet from pain clinic 90/month and valium from my office 30/month  Her husband is here today. When asked about her meds states she does not sell them, her husband occ takes 1-2 a month for his chronic pain She states some days doesn't take, but she gets every month, admits to hoarding her meds including the valium  ETOH- drinks a few beers during the day, and wine mixed with sprite/soda at night, often on empty stomach, states will not eat at times and just have her wine. States she doesn't typically drink moonshine or other hard liquor. But thenwent on tangents of knowing she should not be doing this to herself and she is getting help from the church and they have a group for people with etoh problems. Her son has been having financial problems, serving jail time on the weekend, daughter has some issues. She has been depressed at times  willing to go to formal counseling   Denies any withdrawel symptoms from ETOH, denies needing eye opener   Review Of Systems:  GEN- denies fatigue, fever, weight loss,weakness, recent illness        Objective:    BP 140/86   Pulse 84   Temp 97.7 F (36.5 C) (Oral)   Resp 14   Ht 4' 11"  (1.499 m)   Wt 101 lb (45.8 kg)   SpO2 93%   BMI 20.40 kg/m  GEN- NAD, alert and oriented x3 Psych- tearful at times, not anxious appearing, good eye contact, well groomed         Assessment & Plan:      Problem List Items Addressed This Visit      Unprioritized   Alcohol abuse - Primary   Relevant Orders   Ambulatory referral to Psychology   Anxiety state    Concern about her alcohol use more than what she may be letting on. She seems to be willing to decrease and cut out her consumption, Seems very embarrassed that it came out about her drinking Advised I am just trying to help her Declines AA meetings, states church has a group But willing to go to therapy Her UDS is neg with regards to benzo and opiates She was shown her database and fill rates monthly We have alertedher pain clinic she has appt next week Will defer to them about continuing her opiates I will not prescribe valium any further, she admits she rarely uses and hoards the medication but denies diversion Discussed withdrawel she denies every having this  Approx 25 minutes spent with pt > 50% on counseling  Husband also seemed receptive, advised he needs to get his own prescription for pain meds from his physician       Relevant Orders   Ambulatory referral to Psychology    Other Visit Diagnoses    Family history of stress       Relevant Orders   Ambulatory referral to Psychology      Note: This dictation was  prepared with Dragon dictation along with smaller phrase technology. Any transcriptional errors that result from this process are unintentional.

## 2018-01-02 NOTE — Patient Instructions (Signed)
F/U as previous Valium needs to be prescribed from pain clinic if they will keep you on it

## 2018-01-04 ENCOUNTER — Encounter: Payer: Self-pay | Admitting: Family Medicine

## 2018-01-04 NOTE — Assessment & Plan Note (Addendum)
Concern about her alcohol use more than what she may be letting on. She seems to be willing to decrease and cut out her consumption, Seems very embarrassed that it came out about her drinking Advised I am just trying to help her Declines AA meetings, states church has a group But willing to go to therapy Her UDS is neg with regards to benzo and opiates She was shown her database and fill rates monthly We have alertedher pain clinic she has appt next week Will defer to them about continuing her opiates I will not prescribe valium any further, she admits she rarely uses and hoards the medication but denies diversion Discussed withdrawel she denies every having this  Approx 25 minutes spent with pt > 50% on counseling  Husband also seemed receptive, advised he needs to get his own prescription for pain meds from his physician

## 2018-01-12 DIAGNOSIS — Z79891 Long term (current) use of opiate analgesic: Secondary | ICD-10-CM | POA: Diagnosis not present

## 2018-01-12 DIAGNOSIS — R1084 Generalized abdominal pain: Secondary | ICD-10-CM | POA: Diagnosis not present

## 2018-01-12 DIAGNOSIS — G894 Chronic pain syndrome: Secondary | ICD-10-CM | POA: Diagnosis not present

## 2018-01-12 DIAGNOSIS — M542 Cervicalgia: Secondary | ICD-10-CM | POA: Diagnosis not present

## 2018-01-12 DIAGNOSIS — M545 Low back pain: Secondary | ICD-10-CM | POA: Diagnosis not present

## 2018-01-25 IMAGING — DX DG CHEST 2V
2 series · 2 of 2 positions shown · non-contrast
Comparison: CT of the chest 03/20/2015

CLINICAL DATA: Wheezing, shortness of breath.

EXAM:
CHEST  2 VIEW

[chest pa]
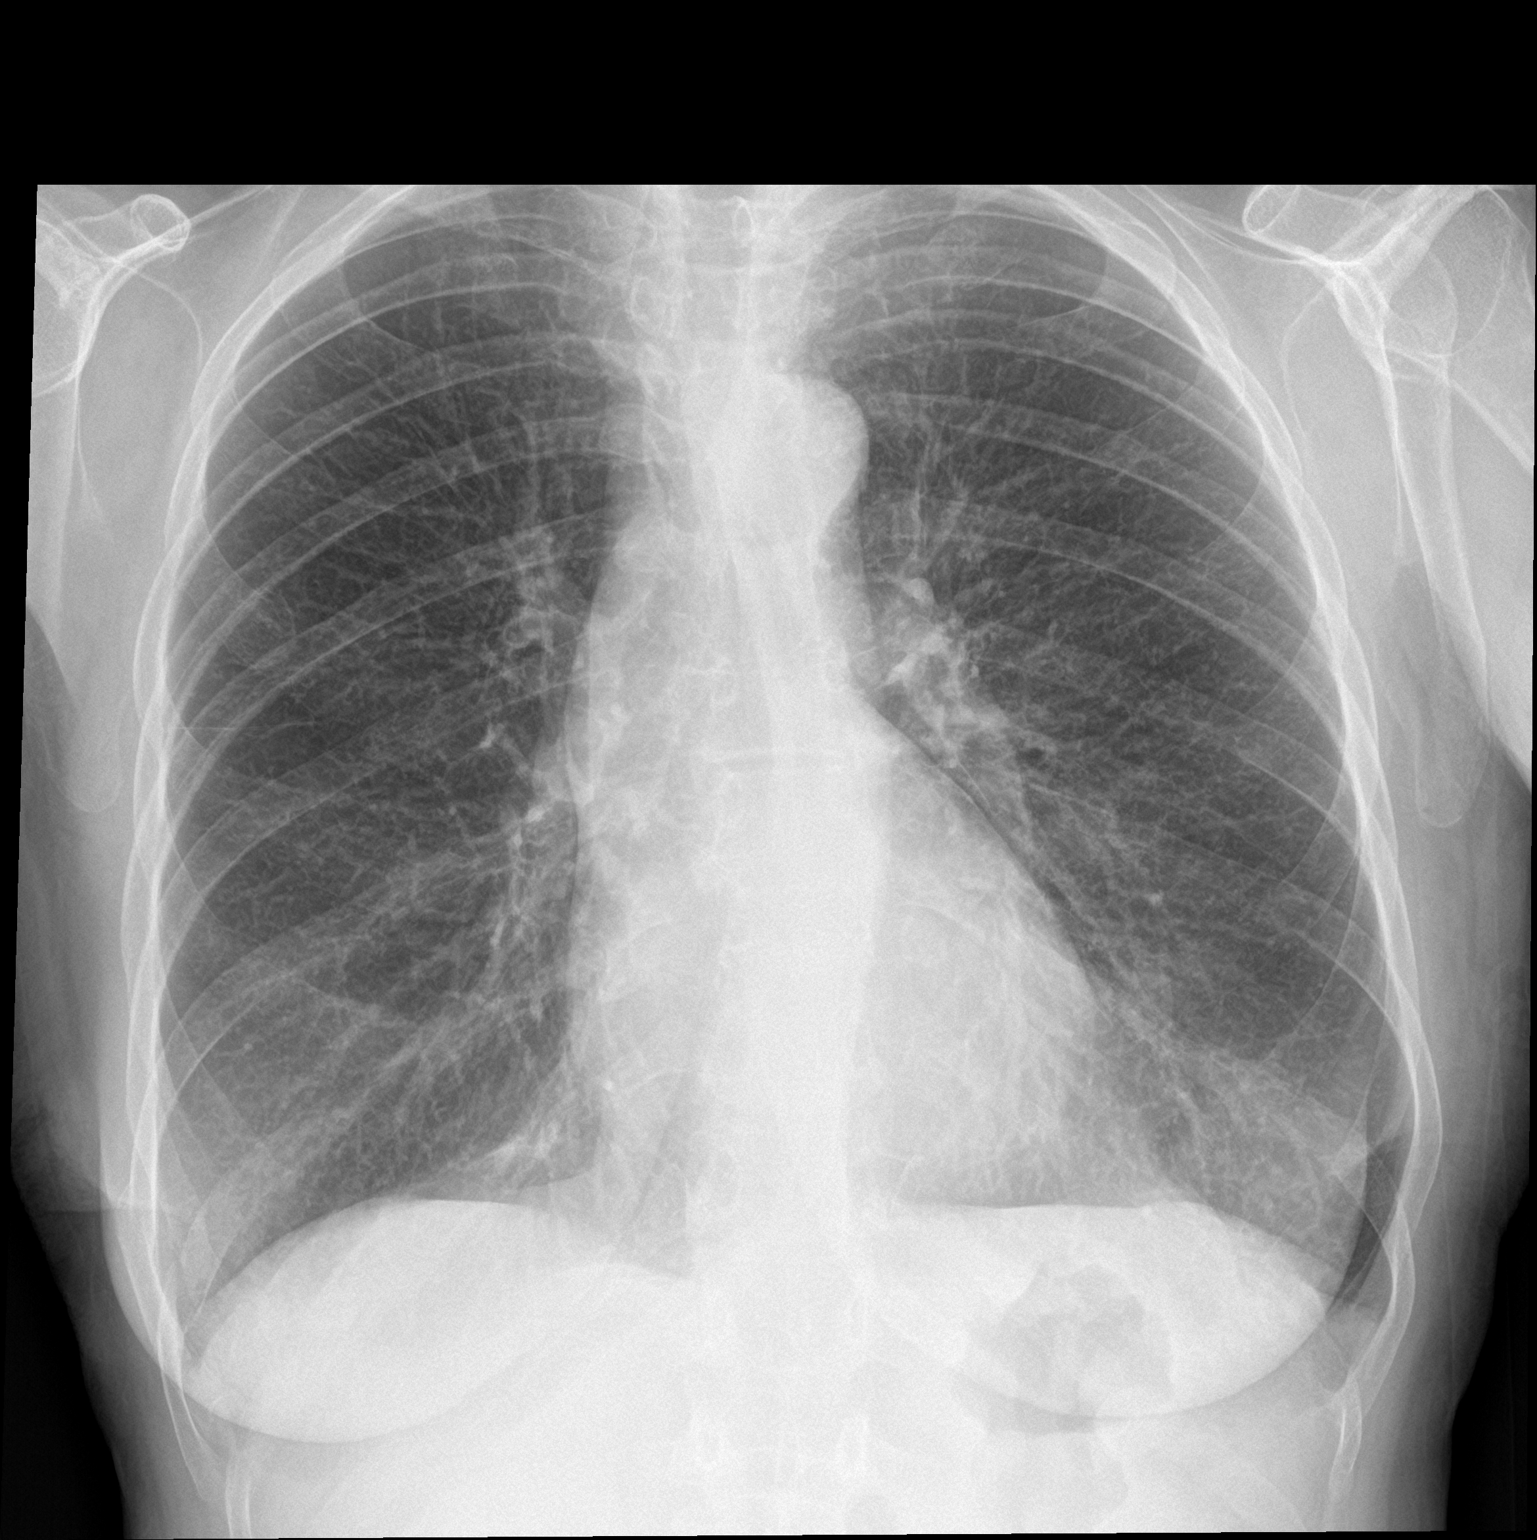

[chest lat]
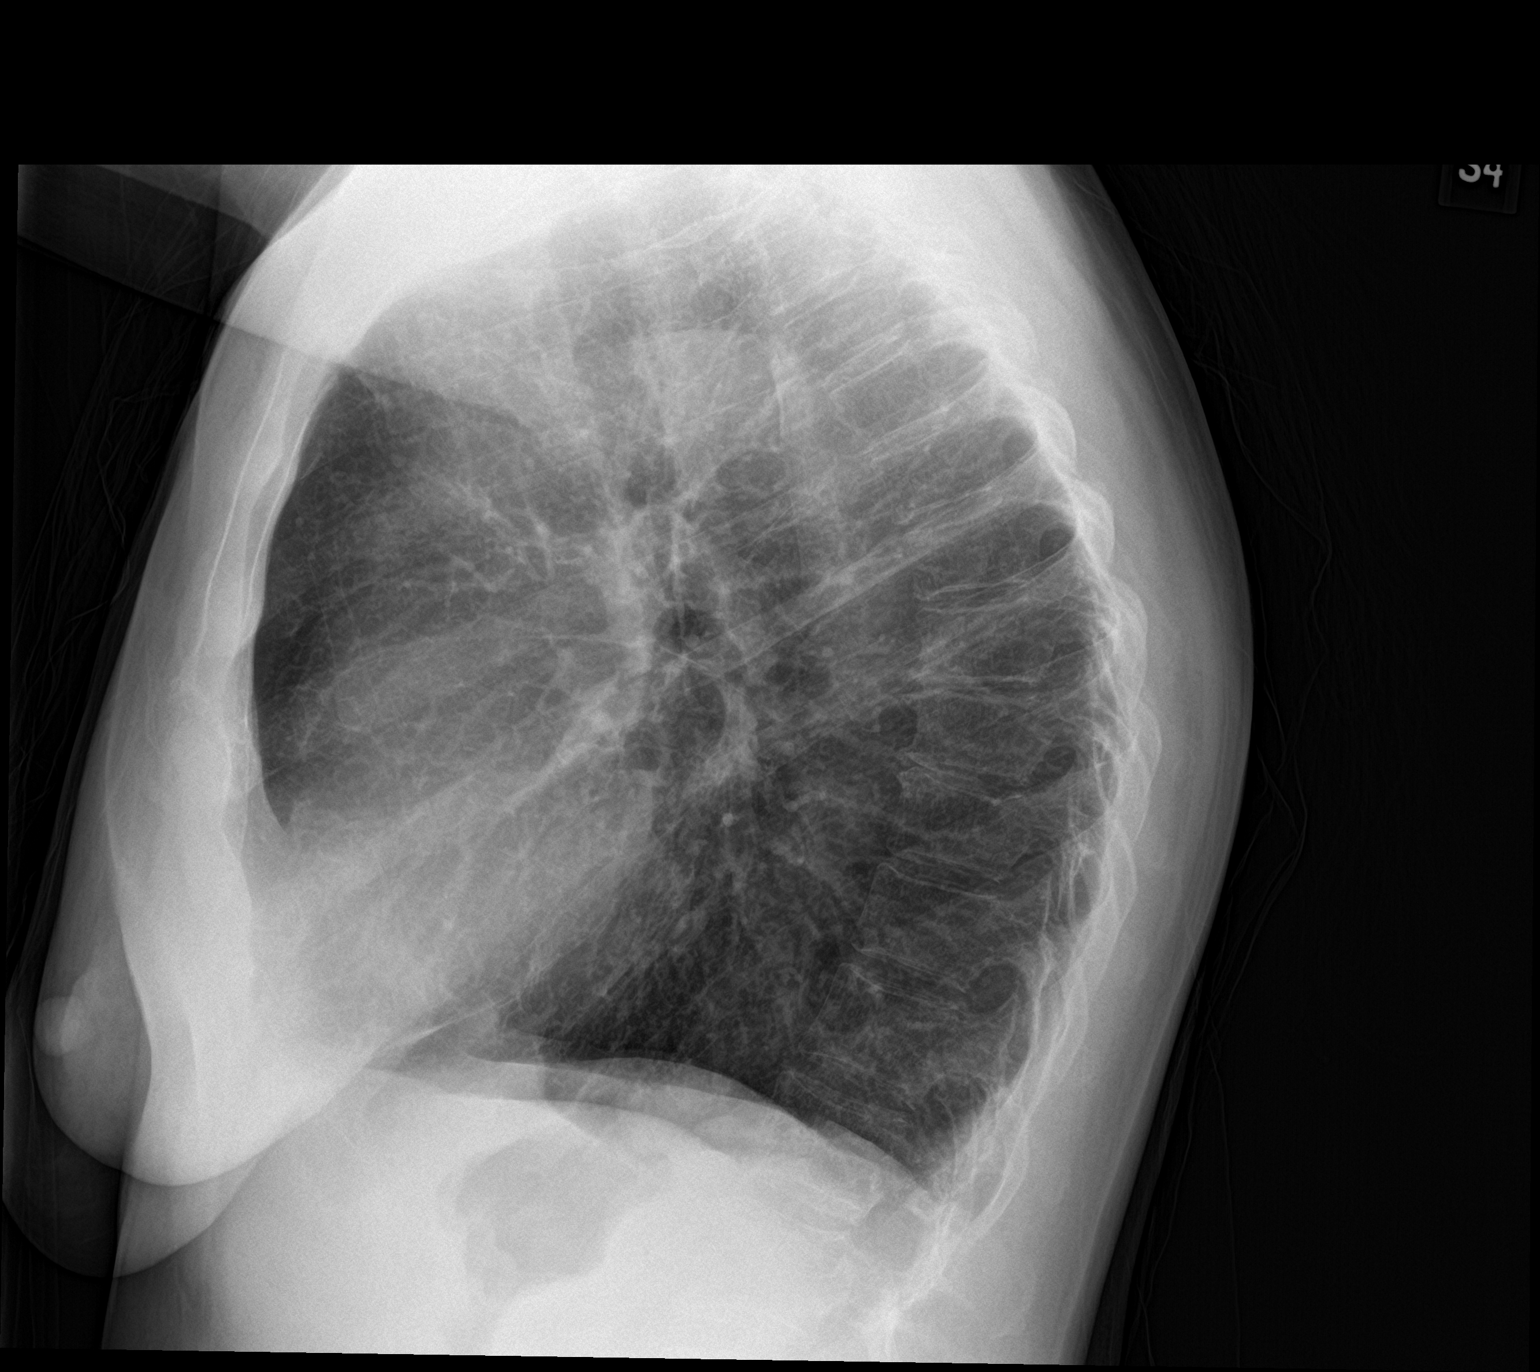

[2 of 2 positions shown; findings below may reference images not displayed]

FINDINGS: The heart size and mediastinal contours are within normal limits.
Both lungs are clear. The visualized skeletal structures are without
acute abnormality. Exaggerated thoracic kyphosis and osteoarthritic
changes in the mid thoracic spine as seen.
IMPRESSION: No active cardiopulmonary disease.

## 2018-02-12 ENCOUNTER — Ambulatory Visit (INDEPENDENT_AMBULATORY_CARE_PROVIDER_SITE_OTHER): Payer: Medicare Other | Admitting: Gastroenterology

## 2018-02-12 ENCOUNTER — Encounter: Payer: Self-pay | Admitting: Gastroenterology

## 2018-02-12 ENCOUNTER — Ambulatory Visit (HOSPITAL_COMMUNITY)
Admission: RE | Admit: 2018-02-12 | Discharge: 2018-02-12 | Disposition: A | Discharge status: Home / Self Care | Payer: Medicare Other | Source: Ambulatory Visit | Attending: Gastroenterology | Admitting: Gastroenterology

## 2018-02-12 ENCOUNTER — Other Ambulatory Visit (HOSPITAL_COMMUNITY)
Admission: RE | Admit: 2018-02-12 | Discharge: 2018-02-12 | Disposition: A | Discharge status: Home / Self Care | Payer: Medicare Other | Source: Ambulatory Visit | Attending: Gastroenterology | Admitting: Gastroenterology

## 2018-02-12 ENCOUNTER — Telehealth: Payer: Self-pay | Admitting: *Deleted

## 2018-02-12 ENCOUNTER — Encounter: Payer: Self-pay | Admitting: *Deleted

## 2018-02-12 ENCOUNTER — Other Ambulatory Visit: Payer: Self-pay | Admitting: *Deleted

## 2018-02-12 VITALS — BP 143/87 | HR 73 | Temp 97.0°F | Ht 60.0 in | Wt 100.0 lb

## 2018-02-12 DIAGNOSIS — R1319 Other dysphagia: Secondary | ICD-10-CM

## 2018-02-12 DIAGNOSIS — R079 Chest pain, unspecified: Secondary | ICD-10-CM | POA: Diagnosis not present

## 2018-02-12 DIAGNOSIS — R0602 Shortness of breath: Secondary | ICD-10-CM | POA: Diagnosis not present

## 2018-02-12 DIAGNOSIS — K50019 Crohn's disease of small intestine with unspecified complications: Secondary | ICD-10-CM

## 2018-02-12 DIAGNOSIS — Z1211 Encounter for screening for malignant neoplasm of colon: Secondary | ICD-10-CM

## 2018-02-12 DIAGNOSIS — R1310 Dysphagia, unspecified: Secondary | ICD-10-CM

## 2018-02-12 DIAGNOSIS — R634 Abnormal weight loss: Secondary | ICD-10-CM | POA: Diagnosis not present

## 2018-02-12 DIAGNOSIS — R1013 Epigastric pain: Secondary | ICD-10-CM

## 2018-02-12 DIAGNOSIS — R071 Chest pain on breathing: Secondary | ICD-10-CM | POA: Insufficient documentation

## 2018-02-12 DIAGNOSIS — M810 Age-related osteoporosis without current pathological fracture: Secondary | ICD-10-CM | POA: Diagnosis not present

## 2018-02-12 DIAGNOSIS — J449 Chronic obstructive pulmonary disease, unspecified: Secondary | ICD-10-CM | POA: Diagnosis not present

## 2018-02-12 LAB — VITAMIN B12: Vitamin B-12: 303 pg/mL (ref 180–914)

## 2018-02-12 MED ORDER — CIPROFLOXACIN HCL 500 MG PO TABS
ORAL_TABLET | ORAL | 0 refills | Status: DC
Start: 2018-02-12 — End: 2018-02-20

## 2018-02-12 MED ORDER — METRONIDAZOLE 500 MG PO TABS: 500.0000 mg | ORAL_TABLET | Freq: Two times a day (BID) | ORAL | 0 refills | Status: DC

## 2018-02-12 NOTE — Assessment & Plan Note (Signed)
AVERAGE RISK  COMPLETE COLONOSCOPY AFTER THANKSGIVING. DISCUSSED PROCEDURE, BENEFITS, & RISKS: < 1% chance of medication reaction, bleeding, perforation, or rupture of spleen/liver. FOLLOW UP IN 6 MOS.

## 2018-02-12 NOTE — Patient Instructions (Addendum)
COMPLETE CHEST XRAY TODAY.  COMPLETE FLU SHOT.  COMPLETE LABS AND DEXA SCAN.   TO REDUCE BLOATING, TAKE CIPRO AND FLAGYL TWICE DAILY FOR 5 DAYS.  MEDICATION SIDE EFFECTS INCLUDE HEEL PAIN, NAUSEA, VOMITING. PATIENT SHOULD AVOID ALCOHOL AND COUGH SYRUP WITH ALCOHOL.   CONTINUE QUESTRAN TWICE DAILY.  COMPLETE COLONOSCOPY AFTER THANKSGIVING.   FOLLOW UP IN 6 MOS.

## 2018-02-12 NOTE — Assessment & Plan Note (Signed)
UNINTENTIONAL WEIGHT LOSS. WEIGHT DOWN 20 LBS.  CHECK TSH.

## 2018-02-12 NOTE — Telephone Encounter (Signed)
Pre-op appointment scheduled for 04/14/18 Tuesday at 12:45pm. Letter mailed. Patient aware.

## 2018-02-12 NOTE — Assessment & Plan Note (Addendum)
SYMPTOMS CONTROLLED/RESOLVED BUT WEIGHT DOWN 20 LBS. ETIOLOGY UNKNOWN.  CONTINUE TO MONITOR SYMPTOMS. COMPLETE THE FLU SHOT.  COMPLETE THE BONE DENSITY SCAN. COMPLETE BLOOD DRAW FOR: VIT D 25-OH, & VITAMIN B12. PLEASE CALL WITH QUESTIONS OR CONCERNS.  FOLLOW UP IN 4 MOS.   FOLLOW UP IN 6 MOS.

## 2018-02-12 NOTE — Assessment & Plan Note (Signed)
SYMPTOMS NOT IDEALLY CONTROLLED.  BARIUM PILL ESOPHAGRAM WITHIN 7-10 DAYS. FOLLOW UP IN 6 MOS.

## 2018-02-12 NOTE — Assessment & Plan Note (Signed)
LAST VITAMIN D 2017.  REPEAT VIT D LEVEL.

## 2018-02-12 NOTE — Progress Notes (Signed)
Subjective:    Patient ID: [name] [name], female    DOB: [birth year], 62 y.o.   MRN: [id number]  Alycia Rossetti, MD  HPI Feels BLOATED and may have pain in left side/chest/flank. SEVER ABDOMINAL PAIN AND PASSED GAS AND FELT BETTER.NO DAIRY-LACTOSE FREE MILK.. NO YOGURT. BMs: PASSES GAS. WEARS A PAD. BROWN BALLS COME OUT/SLIMY. ATE LAMB MEAT VIA [place]. MAY SEE HEMORRHOID BLEEDING: 1<X/MO. TROUBLE WITH CHEST PAIN: 4-5 CIGS A DAY. SOB: FINE, NO FLU SHOT. SORENESS IN LEFT CHEST WALL. HURTS WHEN SHE TAKES A DEEP BREATH.  LAST CXR 2017. NO COUGH. Problems swallowing: PILLS MOSTLY, SOMETIMES FOOD, NOT LIQUIDS. WEIGHT DOWN 20 LBS.    PT DENIES FEVER, CHILLS, HEMATEMESIS, nausea, vomiting, melena, diarrhea, CHANGE IN BOWEL IN HABITS, constipation, OR heartburn or indigestion.  Past Medical History:  Diagnosis Date  . Allergic rhinitis   . Anastomotic ulcer JAN 2009  . Anxiety   . Asthma   . BMI (body mass index) 20.0-29.9 2009 121 lbs  . Concussion   . 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 Crohn disease ([place])   . Crohn disease ([place])   . CTS (carpal tunnel syndrome)   . Diarrhea MULITIFACTORIAL   IBS, LACTOSE INTOLERANCE, SBBO, BILE-SALT  . Elevated liver enzymes 2009: BMI 24 ?Etoh ALT 94, AST 51,  NEG ANA, qIGs& ASMA   MAY 2012 AST 52 ALT 38  . Esophageal stricture 2009  . GERD (gastroesophageal reflux disease)   . Hemorrhoid   . Hyperlipemia   . Hypertension   . Inflammatory bowel disease 2006 CD   SURGICAL REMISSION  . LBP (low back pain)   . Mental disorder   . Migraine   . PONV (postoperative nausea and vomiting)   . Shortness of breath    exertion and humidity  . Shoulder pain    right  . Sleep apnea    Stop [name]   Past Surgical History:  Procedure Laterality Date  . BACK SURGERY    . BILATERAL SALPINGOOPHORECTOMY    . BIOPSY  12/24/2011   Procedure: BIOPSY;  Surgeon: Danie Binder, MD;  Location: [facility];  Service: Endoscopy;;  Gastric  Biopsies  . BIOPSY N/A 06/30/2012   Procedure: BIOPSY;  Surgeon: Danie Binder, MD;  Location: [facility];  Service: Endoscopy;  Laterality: N/A;  Gastric and Esophageal Biopsies  . CARPAL TUNNEL RELEASE  LEFT  . CATARACT EXTRACTION W/PHACO Right 07/30/2016   Procedure: CATARACT EXTRACTION PHACO AND INTRAOCULAR LENS PLACEMENT (IOC);  Surgeon: Rutherford Guys, MD;  Location: [facility];  Service: Ophthalmology;  Laterality: Right;  CDE: 5.45  . CATARACT EXTRACTION W/PHACO Left 08/13/2016   Procedure: CATARACT EXTRACTION PHACO AND INTRAOCULAR LENS PLACEMENT (IOC);  Surgeon: Rutherford Guys, MD;  Location: [facility];  Service: Ophthalmology;  Laterality: Left;  CDE: 5.59  . COLON SURGERY    . COLONOSCOPY  JAN 2009 DIARRHEA, ABD PAIN, BRBPR   ANASTOMOTIC ULCER(3MM), IH MY:TRZNBV ULCER, NL COLON bX  . DILATION AND CURETTAGE OF UTERUS    . ESOPHAGEAL DILATION  12/24/2011   SLF: A stricture was found in the distal esophagus/ Moderate gastritis/ MILD Duodenitis  . ESOPHAGOGASTRODUODENOSCOPY  02/07/09   mild gastritis  . ESOPHAGOGASTRODUODENOSCOPY (EGD) WITH PROPOFOL N/A 06/30/2012   SLF: 1. No definite stricture appreciated 2. small hiatal hernia 3. Gastritis  . ESOPHAGOGASTRODUODENOSCOPY (EGD) WITH PROPOFOL N/A 02/20/2016   Procedure: ESOPHAGOGASTRODUODENOSCOPY (EGD) WITH PROPOFOL;  Surgeon: Danie Binder, MD;  [facility];  Service: Endoscopy;  Laterality: N/A;  930  . gum flap Bilateral   . HEMICOLECTOMY  RIGHT 2006  . HERNIA REPAIR    . LUMBAR DISC SURGERY    . SAVORY DILATION N/A 06/30/2012   Procedure: SAVORY DILATION;  Surgeon: Danie Binder, MD;  Location: [facility];  Service: Endoscopy;  Laterality: N/A;  12.95m , 159m 1569m. SAVORY DILATION N/A 02/20/2016   Procedure: SAVORY DILATION;  Surgeon: SanDanie BinderD;  [facility];  Service: Endoscopy;  Laterality: N/A;  . UPPER GASTROINTESTINAL ENDOSCOPY  JAN 2009 ABD PAIN   GASTRITIS, ESO RING  . UPPER GASTROINTESTINAL ENDOSCOPY  OCT  2010 ABD PAIN, DYSPHAGIA   DIL 15MM, GASTRITIS, NL DUODENUM  . UPPER GASTROINTESTINAL ENDOSCOPY  OCT 2010 DYSPHAGIA   DIL 17 MM, GASTRITIS, NL DUODENUM  . vocal cord surgery  Sep 20, 2010   precancerous areas removed  . wisdom tooth extraction Right    pin placement due to broken jaw   Allergies  Allergen Reactions  . Lovastatin Other (See Comments)    Chest pain  . Naproxen Other (See Comments)    Chest pain/ SOB   . Toradol [Ketorolac Tromethamine] Other (See Comments)    Headache. Non-effective  . Tramadol Other (See Comments)    Headache. Non-effective.  . NMarland Kitchenxium [Esomeprazole Magnesium] Diarrhea    Abdominal pain   Current Outpatient Medications  Medication Sig    . amLODipine (NORVASC) 5 MG tablet TAKE 1 TABLET BY MOUTH ONCE DAILY    . calcium carbonate (TUMS - DOSED IN MG ELEMENTAL CALCIUM) 500 MG chewable tablet Chew 1-2 tablets by mouth 2 (two) times daily as needed for indigestion or heartburn.    . cholestyramine (QUESTRAN) 4 GM/DOSE powder DISSOLVE AND TAKE 2 GRAMS BY MOUTH TWICE DAILY WITH A MEAL- DO NOT TAKE WITHIN 2 HOURS OF OTHER MEDICATIONS    . diazepam (VALIUM) 5 MG tablet TAKE 1 TABLET BY MOUTH EVERY 12 HOURS AS NEEDED FOR ANXIETY    . fluticasone (FLONASE) 50 MCG/ACT nasal spray USE TWO SPRAY(S) IN EACH NOSTRIL ONCE DAILY AS NEEDED FOR ALLERGY OR RHINITIS    . gabapentin (NEURONTIN) 400 MG capsule Take 400 mg by mouth 3 (three) times daily.     . lMarland Kitchensartan-hydrochlorothiazide (HYZAAR) 50-12.5 MG tablet Take 1 tablet by mouth daily. (Patient taking differently: Take 1 tablet by mouth every morning. 1100)    . nystatin (MYCOSTATIN) 100000 UNIT/ML suspension TAKE 5 ML BY MOUTH 4 TIMES DAILY AS NEEDED    . oxyCODONE-acetaminophen (PERCOCET) 10-325 MG tablet Take 1 tablet by mouth 3 (three) times daily. Sometimes takes 4 times daily    . POTASSIUM PO Take 50 mg by mouth daily. OTC    . SYMBICORT 80-4.5 MCG/ACT inhaler INHALE 2 PUFFS BY MOUTH TWICE DAILY    .  triamcinolone ointment (KENALOG) 0.5 % Apply 1 application topically 2 (two) times daily.    . VENTOLIN HFA 108 (90 Base) MCG/ACT inhaler INHALE 2 PUFFS BY MOUTH EVERY 4 HOURS AS NEEDED FOR WHEEZING    .       Review of Systems PER HPI OTHERWISE ALL SYSTEMS ARE NEGATIVE.    Objective:   Physical Exam  Constitutional: She is oriented to person, place, and time. She appears well-developed and well-nourished. No distress.  HENT:  Head: Normocephalic and atraumatic.  Mouth/Throat: Oropharynx is clear and moist. No oropharyngeal exudate.  Eyes: Pupils are equal, round, and reactive to light. No  scleral icterus.  Neck: Normal range of motion. Neck supple.  Cardiovascular: Normal rate, regular rhythm and normal heart sounds.  Pulmonary/Chest: Effort normal and breath sounds normal. No respiratory distress.  Abdominal: Soft. Bowel sounds are normal. She exhibits no distension. There is no tenderness.  Musculoskeletal: She exhibits no edema.  Lymphadenopathy:    She has no cervical adenopathy.  Neurological: She is alert and oriented to person, place, and time.  NO  NEW FOCAL DEFICITS  Psychiatric: She has a normal mood and affect.  Vitals reviewed.     Assessment & Plan:

## 2018-02-12 NOTE — Progress Notes (Signed)
cc'ed to pcp °

## 2018-02-12 NOTE — Assessment & Plan Note (Signed)
SYMPTOMS NOT CONTROLLED AND EXACERBATED BY FATTY MEATS/CHEESE. CLINICALLY IMPROVED BUT NOT IDEALLY CONTROLLED.   TAKE CIPRO AND FLAGYL TWICE DAILY FOR 5 DAYS.  MEDICATION SIDE EFFECTS INCLUDE HEEL PAIN, NAUSEA, VOMITING. PATIENT SHOULD AVOID ALCOHOL AND COUGH SYRUP WITH ALCOHOL.  CONTINUE QUESTRAN TWICE DAILY. FOLLOW UP IN 6 MOS.

## 2018-02-12 NOTE — Assessment & Plan Note (Signed)
SYMPTOMS NOT IDEALLY CONTROLLED.  COMPLETE CHEST XRAY TODAY. FOLLOW UP IN 6 MOS.

## 2018-02-13 LAB — VITAMIN D 25 HYDROXY (VIT D DEFICIENCY, FRACTURES): Vit D, 25-Hydroxy: 9.9 ng/mL — ABNORMAL LOW (ref 30.0–100.0)

## 2018-02-16 ENCOUNTER — Telehealth: Payer: Self-pay | Admitting: Gastroenterology

## 2018-02-16 DIAGNOSIS — M810 Age-related osteoporosis without current pathological fracture: Secondary | ICD-10-CM

## 2018-02-16 MED ORDER — VITAMIN D (ERGOCALCIFEROL) 1.25 MG (50000 UNIT) PO CAPS: 50000.0000 [IU] | ORAL_CAPSULE | ORAL | 0 refills | Status: DC

## 2018-02-16 MED ORDER — VITAMIN D 1000 UNITS PO TABS: 1000.0000 [IU] | ORAL_TABLET | Freq: Every day | ORAL | 11 refills | Status: DC

## 2018-02-16 NOTE — Telephone Encounter (Signed)
PLEASE CALL PT. HER VITAMIN D LEVEL IS LOW. SHE NEEDS SUPPLEMENTS: VITAMIN D AND CALCIUM. SHE NEEDS VITAMIN D 50,000 UNITS A WEEK FOR 8 WEEKS THEN START VITAMIN D 1000 UNITS DAILY FOREVER. SHE NEEDS TO START CALCIUM 500 MG THREE TIMES A DAY FOREVER. THESE MEDS ARE AVAILABLE OTC.

## 2018-02-17 ENCOUNTER — Ambulatory Visit (HOSPITAL_COMMUNITY)
Admission: RE | Admit: 2018-02-17 | Discharge: 2018-02-17 | Disposition: A | Discharge status: Home / Self Care | Payer: Medicare Other | Source: Ambulatory Visit | Attending: Gastroenterology | Admitting: Gastroenterology

## 2018-02-17 ENCOUNTER — Telehealth: Payer: Self-pay | Admitting: Gastroenterology

## 2018-02-17 DIAGNOSIS — M810 Age-related osteoporosis without current pathological fracture: Secondary | ICD-10-CM | POA: Diagnosis not present

## 2018-02-17 DIAGNOSIS — K224 Dyskinesia of esophagus: Secondary | ICD-10-CM | POA: Diagnosis not present

## 2018-02-17 DIAGNOSIS — R1310 Dysphagia, unspecified: Secondary | ICD-10-CM | POA: Insufficient documentation

## 2018-02-17 DIAGNOSIS — R1319 Other dysphagia: Secondary | ICD-10-CM

## 2018-02-17 DIAGNOSIS — M818 Other osteoporosis without current pathological fracture: Secondary | ICD-10-CM | POA: Diagnosis not present

## 2018-02-17 DIAGNOSIS — R4702 Dysphasia: Secondary | ICD-10-CM | POA: Diagnosis not present

## 2018-02-17 NOTE — Addendum Note (Signed)
Addended by: Hassan Rowan on: 02/17/2018 01:38 PM   Modules accepted: Orders

## 2018-02-17 NOTE — Telephone Encounter (Signed)
Please call patient back at [phone] or [phone]

## 2018-02-17 NOTE — Telephone Encounter (Signed)
Tried to call and no answer.

## 2018-02-17 NOTE — Telephone Encounter (Signed)
Referral sent to endocrinology via Epic.

## 2018-02-17 NOTE — Progress Notes (Signed)
To [facility].

## 2018-02-17 NOTE — Progress Notes (Signed)
Pt is aware.  

## 2018-02-17 NOTE — Telephone Encounter (Signed)
PT is aware and will pick up the medication. She also asked if maybe she should consider the Reclast infusion again. Please advise!

## 2018-02-17 NOTE — Telephone Encounter (Signed)
PLEASE CALL PT. SHE SHOULD DISCUSS WITH DR. Buelah Manis. SHE SHOULD SEE ENDOCRINOLOGY AS WELL, Dx: OSTEOPOROSIS.

## 2018-02-17 NOTE — Telephone Encounter (Signed)
PT is aware and another note was sent to [place] to send referral.

## 2018-02-17 NOTE — Telephone Encounter (Signed)
See results, I have spoken to pt.

## 2018-02-17 NOTE — Progress Notes (Signed)
PT is aware. Ok to refer to Endocrinology.

## 2018-02-18 NOTE — Progress Notes (Signed)
PT is aware.

## 2018-02-18 NOTE — Progress Notes (Signed)
CC'D TO PCP °

## 2018-02-20 ENCOUNTER — Ambulatory Visit (INDEPENDENT_AMBULATORY_CARE_PROVIDER_SITE_OTHER): Payer: Medicare Other | Admitting: Adult Health

## 2018-02-20 ENCOUNTER — Encounter: Payer: Self-pay | Admitting: Adult Health

## 2018-02-20 VITALS — BP 124/81 | HR 73 | Ht 60.0 in | Wt 101.0 lb

## 2018-02-20 DIAGNOSIS — L723 Sebaceous cyst: Secondary | ICD-10-CM | POA: Diagnosis not present

## 2018-02-20 NOTE — Progress Notes (Signed)
  Subjective:     Patient ID: [name] [name], female   DOB: 1955-08-19, 62 y.o.   MRN: [id number]  HPI [name] [name] is a 62 year old white female in complaining of lump in rectal area. PCP is Dr Buelah Manis.  Review of Systems Has lump in rectal area Reviewed past medical,surgical, social and family history. Reviewed medications and allergies.     Objective:   Physical Exam BP 124/81 (BP Location: Left Arm, Patient Position: Sitting, Cuff Size: Normal)   Pulse 73   Ht 5' (1.524 m)   Wt 101 lb (45.8 kg)   BMI 19.73 kg/m   Skin warm and dry,has 2 cm sebaceous cyst near rectal opening and has hemorrhoid remnants.     Assessment:     1. Sebaceous cyst       Plan:     Return 10/22 for I&D of sebaceous cyst with Dr Glo Herring   Review handout on epidermal cyst

## 2018-02-20 NOTE — Patient Instructions (Signed)
Epidermal Cyst An epidermal cyst is sometimes called an epidermal inclusion cyst or an infundibular cyst. It is a sac made of skin tissue. The sac contains a substance called keratin. Keratin is a protein that is normally secreted through the hair follicles. When keratin becomes trapped in the top layer of skin (epidermis), it can form an epidermal cyst. Epidermal cysts are usually found on the face, neck, trunk, and genitals. These cysts are usually harmless (benign), and they may not cause symptoms unless they become infected. It is important not to pop epidermal cysts yourself. What are the causes? This condition may be caused by:  A blocked hair follicle.  A hair that curls and re-enters the skin instead of growing straight out of the skin (ingrown hair).  A blocked pore.  Irritated skin.  An injury to the skin.  Certain conditions that are passed along from parent to child (inherited).  Human papillomavirus (HPV).  What increases the risk? The following factors may make you more likely to develop an epidermal cyst:  Having acne.  Being overweight.  Wearing tight clothing.  What are the signs or symptoms? The only symptom of this condition may be a small, painless lump underneath the skin. When an epidermal cyst becomes infected, symptoms may include:  Redness.  Inflammation.  Tenderness.  Warmth.  Fever.  Keratin draining from the cyst. Keratin may look like a grayish-white, bad-smelling substance.  Pus draining from the cyst.  How is this diagnosed? This condition is diagnosed with a physical exam. In some cases, you may have a sample of tissue (biopsy) taken from your cyst to be examined under a microscope or tested for bacteria. You may be referred to a health care provider who specializes in skin care (dermatologist). How is this treated? In many cases, epidermal cysts go away on their own without treatment. If a cyst becomes infected, treatment may  include:  Opening and draining the cyst. After draining, minor surgery to remove the rest of the cyst may be done.  Antibiotic medicine to help prevent infection.  Injections of medicines (steroids) that help to reduce inflammation.  Surgery to remove the cyst. Surgery may be done if: ? The cyst becomes large. ? The cyst bothers you. ? There is a chance that the cyst could turn into cancer.  Follow these instructions at home:  Take over-the-counter and prescription medicines only as told by your health care provider.  If you were prescribed an antibiotic, use it as told by your health care provider. Do not stop using the antibiotic even if you start to feel better.  Keep the area around your cyst clean and dry.  Wear loose, dry clothing.  Do not try to pop your cyst.  Avoid touching your cyst.  Check your cyst every day for signs of infection.  Keep all follow-up visits as told by your health care provider. This is important. How is this prevented?  Wear clean, dry, clothing.  Avoid wearing tight clothing.  Keep your skin clean and dry. Shower or take baths every day.  Wash your body with a benzoyl peroxide wash when you shower or bathe. Contact a health care provider if:  Your cyst develops symptoms of infection.  Your condition is not improving or is getting worse.  You develop a cyst that looks different from other cysts you have had.  You have a fever. Get help right away if:  Redness spreads from the cyst into the surrounding area. This information is   not intended to replace advice given to you by your health care provider. Make sure you discuss any questions you have with your health care provider. Document Released: 03/23/2004 Document Revised: 12/20/2015 Document Reviewed: 02/22/2015 Elsevier Interactive Patient Education  2018 [employer]  

## 2018-02-24 ENCOUNTER — Encounter: Payer: Self-pay | Admitting: Family Medicine

## 2018-02-24 ENCOUNTER — Ambulatory Visit (INDEPENDENT_AMBULATORY_CARE_PROVIDER_SITE_OTHER): Payer: Medicare Other | Admitting: Obstetrics and Gynecology

## 2018-02-24 ENCOUNTER — Encounter: Payer: Self-pay | Admitting: Obstetrics and Gynecology

## 2018-02-24 ENCOUNTER — Ambulatory Visit (INDEPENDENT_AMBULATORY_CARE_PROVIDER_SITE_OTHER): Payer: Medicare Other | Admitting: Family Medicine

## 2018-02-24 VITALS — BP 124/78 | HR 108 | Ht 60.0 in | Wt 102.4 lb

## 2018-02-24 VITALS — BP 108/74 | HR 80 | Temp 98.6°F | Resp 18 | Ht 59.0 in | Wt 101.0 lb

## 2018-02-24 DIAGNOSIS — K6289 Other specified diseases of anus and rectum: Secondary | ICD-10-CM | POA: Diagnosis not present

## 2018-02-24 DIAGNOSIS — K50019 Crohn's disease of small intestine with unspecified complications: Secondary | ICD-10-CM

## 2018-02-24 DIAGNOSIS — Z23 Encounter for immunization: Secondary | ICD-10-CM

## 2018-02-24 DIAGNOSIS — M810 Age-related osteoporosis without current pathological fracture: Secondary | ICD-10-CM | POA: Diagnosis not present

## 2018-02-24 NOTE — Progress Notes (Signed)
[facility] Visit  @DATE @            Patient name: [name] [name] MRN [id number]  Date of birth: 1955-06-09  CC & HPI:  [name] [name] is a 62 y.o. female presenting today for I&D of cyst to the genital region. She notes that she is unsure of the cause of her cyst, however, she notes that she was recently treated with antibiotics for an oral issue. She hasn't tried any medications for the relief of her symptoms. She denies drainage, fever, or any other symptoms.   ROS:  ROS  +cyst near anal region All systems are negative except as noted in the HPI and PMH.   Pertinent History Reviewed:   Reviewed: Significant for  Medical         Past Medical History:  Diagnosis Date  . Allergic rhinitis   . Anastomotic ulcer JAN 2009  . Anxiety   . Asthma   . BMI (body mass index) 20.0-29.9 2009 121 lbs  . Concussion   . 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 Crohn disease ([place])   . Crohn disease ([place])   . CTS (carpal tunnel syndrome)   . Diarrhea MULITIFACTORIAL   IBS, LACTOSE INTOLERANCE, SBBO, BILE-SALT  . Elevated liver enzymes 2009: BMI 24 ?Etoh ALT 94, AST 51,  NEG ANA, qIGs& ASMA   MAY 2012 AST 52 ALT 38  . Esophageal stricture 2009  . GERD (gastroesophageal reflux disease)   . Hemorrhoid   . Hyperlipemia   . Hypertension   . Inflammatory bowel disease 2006 CD   SURGICAL REMISSION  . LBP (low back pain)   . Mental disorder   . Migraine   . Osteoporosis   . PONV (postoperative nausea and vomiting)   . Shortness of breath    exertion and humidity  . Shoulder pain    right  . Sleep apnea    Stop [name]                              Surgical Hx:    Past Surgical History:  Procedure Laterality Date  . BACK SURGERY    . BILATERAL SALPINGOOPHORECTOMY    . BIOPSY  12/24/2011   Procedure: BIOPSY;  Surgeon: Danie Binder, MD;  Location: [facility];  Service: Endoscopy;;  Gastric Biopsies  . BIOPSY N/A 06/30/2012   Procedure: BIOPSY;  Surgeon: Danie Binder, MD;  Location: [facility];  Service: Endoscopy;  Laterality: N/A;  Gastric and Esophageal Biopsies  . CARPAL TUNNEL RELEASE  LEFT  . CATARACT EXTRACTION W/PHACO Right 07/30/2016   Procedure: CATARACT EXTRACTION PHACO AND INTRAOCULAR LENS PLACEMENT (IOC);  Surgeon: Rutherford Guys, MD;  Location: [facility];  Service: Ophthalmology;  Laterality: Right;  CDE: 5.45  . CATARACT EXTRACTION W/PHACO Left 08/13/2016   Procedure: CATARACT EXTRACTION PHACO AND INTRAOCULAR LENS PLACEMENT (IOC);  Surgeon: Rutherford Guys, MD;  Location: [facility];  Service: Ophthalmology;  Laterality: Left;  CDE: 5.59  . COLON SURGERY    . COLONOSCOPY  JAN 2009 DIARRHEA, ABD PAIN, BRBPR   ANASTOMOTIC ULCER(3MM), IH JE:HUDJSH ULCER, NL COLON bX  . DILATION AND CURETTAGE OF UTERUS    . ESOPHAGEAL DILATION  12/24/2011   SLF: A stricture was found in the distal esophagus/ Moderate gastritis/ MILD Duodenitis  . ESOPHAGOGASTRODUODENOSCOPY  02/07/09   mild gastritis  . ESOPHAGOGASTRODUODENOSCOPY (EGD) WITH PROPOFOL N/A  06/30/2012   SLF: 1. No definite stricture appreciated 2. small hiatal hernia 3. Gastritis  . ESOPHAGOGASTRODUODENOSCOPY (EGD) WITH PROPOFOL N/A 02/20/2016   Procedure: ESOPHAGOGASTRODUODENOSCOPY (EGD) WITH PROPOFOL;  Surgeon: Danie Binder, MD;  [facility];  Service: Endoscopy;  Laterality: N/A;  930  . gum flap Bilateral   . HEMICOLECTOMY  RIGHT 2006  . HERNIA REPAIR    . LUMBAR DISC SURGERY    . SAVORY DILATION N/A 06/30/2012   Procedure: SAVORY DILATION;  Surgeon: Danie Binder, MD;  Location: [facility];  Service: Endoscopy;  Laterality: N/A;  12.56m , 122m 1574m. SAVORY DILATION N/A 02/20/2016   Procedure: SAVORY DILATION;  Surgeon: SanDanie BinderD;  [facility];  Service: Endoscopy;  Laterality: N/A;  . UPPER GASTROINTESTINAL ENDOSCOPY  JAN 2009 ABD PAIN   GASTRITIS, ESO RING  . UPPER GASTROINTESTINAL ENDOSCOPY  OCT 2010 ABD PAIN, DYSPHAGIA   DIL 15MM, GASTRITIS, NL DUODENUM  . UPPER  GASTROINTESTINAL ENDOSCOPY  OCT 2010 DYSPHAGIA   DIL 17 MM, GASTRITIS, NL DUODENUM  . vocal cord surgery  Sep 20, 2010   precancerous areas removed  . wisdom tooth extraction Right    pin placement due to broken jaw   Medications: Reviewed & Updated - see associated section                       Current Outpatient Medications:  .  amLODipine (NORVASC) 5 MG tablet, TAKE 1 TABLET BY MOUTH ONCE DAILY, Disp: 90 tablet, Rfl: 3 .  calcium carbonate (TUMS - DOSED IN MG ELEMENTAL CALCIUM) 500 MG chewable tablet, Chew 1-2 tablets by mouth 2 (two) times daily as needed for indigestion or heartburn., Disp: , Rfl:  .  CALCIUM PO, Take 500 mg by mouth 3 (three) times daily., Disp: , Rfl:  .  cholestyramine (QUESTRAN) 4 GM/DOSE powder, DISSOLVE AND TAKE 2 GRAMS BY MOUTH TWICE DAILY WITH A MEAL- DO NOT TAKE WITHIN 2 HOURS OF OTHER MEDICATIONS, Disp: 378 g, Rfl: 3 .  dexlansoprazole (DEXILANT) 60 MG capsule, 1 PO EVERY MORNING WITH BREAKFAST., Disp: 30 capsule, Rfl: 11 .  diazepam (VALIUM) 5 MG tablet, TAKE 1 TABLET BY MOUTH EVERY 12 HOURS AS NEEDED FOR ANXIETY, Disp: 30 tablet, Rfl: 2 .  fluticasone (FLONASE) 50 MCG/ACT nasal spray, USE TWO SPRAY(S) IN EACH NOSTRIL ONCE DAILY AS NEEDED FOR ALLERGY OR RHINITIS, Disp: 16 g, Rfl: 0 .  gabapentin (NEURONTIN) 400 MG capsule, Take 400 mg by mouth 3 (three) times daily. , Disp: , Rfl:  .  losartan-hydrochlorothiazide (HYZAAR) 50-12.5 MG tablet, Take 1 tablet by mouth daily. (Patient taking differently: Take 1 tablet by mouth every morning. 1100), Disp: 30 tablet, Rfl: 3 .  nystatin (MYCOSTATIN) 100000 UNIT/ML suspension, TAKE 5 ML BY MOUTH 4 TIMES DAILY AS NEEDED, Disp: 500 mL, Rfl: 0 .  oxyCODONE-acetaminophen (PERCOCET) 10-325 MG tablet, Take 1 tablet by mouth 3 (three) times daily. Sometimes takes 4 times daily, Disp: , Rfl:  .  POTASSIUM PO, Take 50 mg by mouth daily. OTC, Disp: , Rfl:  .  SYMBICORT 80-4.5 MCG/ACT inhaler, INHALE 2 PUFFS BY MOUTH TWICE DAILY,  Disp: 1 Inhaler, Rfl: 11 .  triamcinolone ointment (KENALOG) 0.5 %, Apply 1 application topically 2 (two) times daily., Disp: 30 g, Rfl: 2 .  TURMERIC PO, Take by mouth daily., Disp: , Rfl:  .  VENTOLIN HFA 108 (90 Base) MCG/ACT inhaler, INHALE 2 PUFFS BY MOUTH EVERY  4 HOURS AS NEEDED FOR WHEEZING, Disp: 18 each, Rfl: 2 .  Vitamin D, Ergocalciferol, (DRISDOL) 50000 units CAPS capsule, Take 1 capsule (50,000 Units total) by mouth every 7 (seven) days., Disp: 8 capsule, Rfl: 0   Social History: Reviewed -  reports that she has been smoking cigarettes. She has been smoking about 0.00 packs per day for the past 30.00 years. She has never used smokeless tobacco.  Objective Findings:  Vitals: Blood pressure 124/78, pulse (!) 108, height 5' (1.524 m), weight 102 lb 6.4 oz (46.4 kg).  PHYSICAL EXAMINATION General appearance - alert, well appearing, and in no distress and oriented to person, place, and time Mental status - alert, oriented to person, place, and time, normal mood, behavior, speech, dress, motor activity, and thought processes  PELVIC GU: 1.5 cm perianal sebaceous cyst at 10 o'clock position.    Incision & Drainage:  INCISION AND DRAINAGE PROCEDURE NOTE: Patient identification was confirmed and verbal consent was obtained. 1.5 cm Perianal sebaceous cyst at 10 o'clock position.    Site anesthetized with 10 cc lidocaine without epinephrine, incision made over site, 1.5 cm sebaceous cyst removed completely from the 10 o'clock position around the anus and one 4-0 vicryl suture to reapproximate. EBL minimal Then the area was covered with neosporin, and dry, sterile dressing.    Pt tolerated procedure well without complications.  Instructions for care discussed verbally with patient and additional instructions for homecare and f/u.   Assessment & Plan:   A:  1.  Perianal sebaceous cyst at 10 o'clock position, simple, excised in toto  P:  1.  follow up in 2 weeks if persistent pain  or swelling.   By signing my name below, I, Soijett Blue, attest that this documentation has been prepared under the direction and in the presence of Jonnie Kind, MD. Electronically Signed: Soijett Blue, Presenter, broadcasting. 02/24/18. 3:16 PM.  I personally performed the services described in this documentation, which was SCRIBED in my presence. The recorded information has been reviewed and considered accurate. It has been edited as necessary during review. Jonnie Kind, MD

## 2018-02-24 NOTE — Addendum Note (Signed)
Addended by: Shary Decamp B on: 02/24/2018 04:45 PM   Modules accepted: Orders

## 2018-02-24 NOTE — Patient Instructions (Signed)
Epidermal Cyst Removal, Care After Refer to this sheet in the next few weeks. These instructions provide you with information about caring for yourself after your procedure. Your health care provider may also give you more specific instructions. Your treatment has been planned according to current medical practices, but problems sometimes occur. Call your health care provider if you have any problems or questions after your procedure. What can I expect after the procedure? After the procedure, it is common to have:  Soreness in the area where your cyst was removed.  Tightness or itching from your skin sutures.  Follow these instructions at home:  Take medicines only as directed by your health care provider.  If you were prescribed an antibiotic medicine, finish all of it even if you start to feel better.  Use antibiotic ointment as directed by your health care provider. Follow the instructions carefully.  There are many different ways to close and cover an incision, including stitches (sutures), skin glue, and adhesive strips. Follow your health care provider's instructions about: ? Incision care. ? Bandage (dressing) changes and removal. ? Incision closure removal.  Keep the bandage (dressing) dry until your health care provider says that it can be removed. Take sponge baths only. Ask your health care provider when you can start showering or taking a bath.  After your dressing is off, check your incision every day for signs of infection. Watch for: ? Redness, swelling, or pain. ? Fluid, blood, or pus.  You can return to your normal activities. Do not do anything that stretches or puts pressure on your incision.  You can return to your normal diet.  Keep all follow-up visits as directed by your health care provider. This is important. Contact a health care provider if:  You have a fever.  Your incision bleeds.  You have redness, swelling, or pain in the incision area.  You  have fluid, blood, or pus coming from your incision.  Your cyst comes back after surgery. This information is not intended to replace advice given to you by your health care provider. Make sure you discuss any questions you have with your health care provider. Document Released: 05/13/2014 Document Revised: 09/28/2015 Document Reviewed: 01/05/2014 Elsevier Interactive Patient Education  2018 [employer].

## 2018-02-24 NOTE — Progress Notes (Signed)
Subjective:    Patient ID: [name] [name], female    DOB: 02-Mar-1956, 62 y.o.   MRN: [id number]  HPI Patient is a 62 year old Caucasian patient.  She normally sees Dr. Buelah Manis.  Past medical history is significant for Crohn's disease.  According to the patient she has been on prednisone numerous times in the past due to inflammation.  She also has a history of poor oral absorption of calcium and vitamin D.  At present her gastroenterologist currently has her on high-dose vitamin D replacement in addition to calcium.  She also has significant underlying gastrointestinal issues including gastritis.  Recently, I discover the results of her bone density test while covering for my partner:  AP Spine L1-L3 02/17/2018 60.4 Osteoporosis -3.9 0.703 g/cm2 0.7% - AP Spine L1-L3 01/05/2016 58.3 Osteoporosis -3.9 0.698 g/cm2 3.4% - AP Spine L1-L3 09/09/2014 57.0 Osteoporosis -4.1 0.675 g/cm2 0.7% - AP Spine L1-L3 05/28/2012 54.7 Osteoporosis -4.2 0.670 g/cm2 -6.2% Yes AP Spine L1-L3 11/08/2008 51.1 Osteoporosis -3.8 0.714 g/cm2 - -  DualFemur Total Left 02/17/2018 60.4 Osteoporosis -3.3 0.595 g/cm2 -4.6% Yes DualFemur Total Left 01/05/2016 58.3 Osteoporosis -3.0 0.624 g/cm2 -4.0% Yes DualFemur Total Left 09/09/2014 57.0 Osteoporosis -2.8 0.650 g/cm2 -1.5% - DualFemur Total Left 05/28/2012 54.7 Osteoporosis -2.8 0.660 g/cm2 -7.0% Yes DualFemur Total Left 11/08/2008 51.1 Osteopenia -2.4 0.710 g/cm2 - -  DualFemur Total Mean 02/17/2018 60.4 Osteoporosis -3.0 0.628 g/cm2 -1.3% - DualFemur Total Mean 01/05/2016 58.3 Osteoporosis -3.0 0.636 g/cm2 -2.5% - DualFemur Total Mean 09/09/2014 57.0 Osteoporosis -2.8 0.652 g/cm2 -4.7% Yes DualFemur Total Mean 05/28/2012 54.7 Osteoporosis -2.6 0.684 g/cm2 -6.6% Yes DualFemur Total Mean 11/08/2008 51.1 Osteopenia -2.2 0.732 g/cm2 - -  Patient clearly has significant osteoporosis in both hips.  She is currently only taking calcium and vitamin D.  She is here  today to discuss treatment options.  Given her history of gastritis, I believe Fosamax is a poor option for the patient.  She also states that on 2 separate occasions she has taken Reclast.  Apparently both time she took it for 24 months.  She is not currently on any treatment. Past Medical History:  Diagnosis Date  . Allergic rhinitis   . Anastomotic ulcer JAN 2009  . Anxiety   . Asthma   . BMI (body mass index) 20.0-29.9 2009 121 lbs  . Concussion   . 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 Crohn disease ([place])   . Crohn disease ([place])   . CTS (carpal tunnel syndrome)   . Diarrhea MULITIFACTORIAL   IBS, LACTOSE INTOLERANCE, SBBO, BILE-SALT  . Elevated liver enzymes 2009: BMI 24 ?Etoh ALT 94, AST 51,  NEG ANA, qIGs& ASMA   MAY 2012 AST 52 ALT 38  . Esophageal stricture 2009  . GERD (gastroesophageal reflux disease)   . Hemorrhoid   . Hyperlipemia   . Hypertension   . Inflammatory bowel disease 2006 CD   SURGICAL REMISSION  . LBP (low back pain)   . Mental disorder   . Migraine   . PONV (postoperative nausea and vomiting)   . Shortness of breath    exertion and humidity  . Shoulder pain    right  . Sleep apnea    Stop [name]   Past Surgical History:  Procedure Laterality Date  . BACK SURGERY    . BILATERAL SALPINGOOPHORECTOMY    . BIOPSY  12/24/2011   Procedure: BIOPSY;  Surgeon: Danie Binder, MD;  Location: [facility];  Service: Endoscopy;;  Gastric Biopsies  . BIOPSY N/A 06/30/2012   Procedure: BIOPSY;  Surgeon: Danie Binder, MD;  Location: [facility];  Service: Endoscopy;  Laterality: N/A;  Gastric and Esophageal Biopsies  . CARPAL TUNNEL RELEASE  LEFT  . CATARACT EXTRACTION W/PHACO Right 07/30/2016   Procedure: CATARACT EXTRACTION PHACO AND INTRAOCULAR LENS PLACEMENT (IOC);  Surgeon: Rutherford Guys, MD;  Location: [facility];  Service: Ophthalmology;  Laterality: Right;  CDE: 5.45  . CATARACT EXTRACTION W/PHACO Left 08/13/2016   Procedure: CATARACT EXTRACTION PHACO  AND INTRAOCULAR LENS PLACEMENT (IOC);  Surgeon: Rutherford Guys, MD;  Location: [facility];  Service: Ophthalmology;  Laterality: Left;  CDE: 5.59  . COLON SURGERY    . COLONOSCOPY  JAN 2009 DIARRHEA, ABD PAIN, BRBPR   ANASTOMOTIC ULCER(3MM), IH HY:IFOYDX ULCER, NL COLON bX  . DILATION AND CURETTAGE OF UTERUS    . ESOPHAGEAL DILATION  12/24/2011   SLF: A stricture was found in the distal esophagus/ Moderate gastritis/ MILD Duodenitis  . ESOPHAGOGASTRODUODENOSCOPY  02/07/09   mild gastritis  . ESOPHAGOGASTRODUODENOSCOPY (EGD) WITH PROPOFOL N/A 06/30/2012   SLF: 1. No definite stricture appreciated 2. small hiatal hernia 3. Gastritis  . ESOPHAGOGASTRODUODENOSCOPY (EGD) WITH PROPOFOL N/A 02/20/2016   Procedure: ESOPHAGOGASTRODUODENOSCOPY (EGD) WITH PROPOFOL;  Surgeon: Danie Binder, MD;  [facility];  Service: Endoscopy;  Laterality: N/A;  930  . gum flap Bilateral   . HEMICOLECTOMY  RIGHT 2006  . HERNIA REPAIR    . LUMBAR DISC SURGERY    . SAVORY DILATION N/A 06/30/2012   Procedure: SAVORY DILATION;  Surgeon: Danie Binder, MD;  Location[facility];  Service: Endoscopy;  Laterality: N/A;  12.41m , 160m 1526m. SAVORY DILATION N/A 02/20/2016   Procedure: SAVORY DILATION;  Surgeon: SanDanie BinderD;  [facility];  Service: Endoscopy;  Laterality: N/A;  . UPPER GASTROINTESTINAL ENDOSCOPY  JAN 2009 ABD PAIN   GASTRITIS, ESO RING  . UPPER GASTROINTESTINAL ENDOSCOPY  OCT 2010 ABD PAIN, DYSPHAGIA   DIL 15MM, GASTRITIS, NL DUODENUM  . UPPER GASTROINTESTINAL ENDOSCOPY  OCT 2010 DYSPHAGIA   DIL 17 MM, GASTRITIS, NL DUODENUM  . vocal cord surgery  Sep 20, 2010   precancerous areas removed  . wisdom tooth extraction Right    pin placement due to broken jaw   Current Outpatient Medications on File Prior to Visit  Medication Sig Dispense Refill  . amLODipine (NORVASC) 5 MG tablet TAKE 1 TABLET BY MOUTH ONCE DAILY 90 tablet 3  . calcium carbonate (TUMS - DOSED IN MG ELEMENTAL  CALCIUM) 500 MG chewable tablet Chew 1-2 tablets by mouth 2 (two) times daily as needed for indigestion or heartburn.    . CMarland KitchenLCIUM PO Take 500 mg by mouth 3 (three) times daily.    . cholestyramine (QUESTRAN) 4 GM/DOSE powder DISSOLVE AND TAKE 2 GRAMS BY MOUTH TWICE DAILY WITH A MEAL- DO NOT TAKE WITHIN 2 HOURS OF OTHER MEDICATIONS 378 g 3  . dexlansoprazole (DEXILANT) 60 MG capsule 1 PO EVERY MORNING WITH BREAKFAST. 30 capsule 11  . diazepam (VALIUM) 5 MG tablet TAKE 1 TABLET BY MOUTH EVERY 12 HOURS AS NEEDED FOR ANXIETY 30 tablet 2  . fluticasone (FLONASE) 50 MCG/ACT nasal spray USE TWO SPRAY(S) IN EACH NOSTRIL ONCE DAILY AS NEEDED FOR ALLERGY OR RHINITIS 16 g 0  . gabapentin (NEURONTIN) 400 MG capsule Take 400 mg by mouth 3 (three) times daily.     . lMarland Kitchensartan-hydrochlorothiazide (HYZAAR) 50-12.5 MG tablet  Take 1 tablet by mouth daily. (Patient taking differently: Take 1 tablet by mouth every morning. 1100) 30 tablet 3  . nystatin (MYCOSTATIN) 100000 UNIT/ML suspension TAKE 5 ML BY MOUTH 4 TIMES DAILY AS NEEDED 500 mL 0  . oxyCODONE-acetaminophen (PERCOCET) 10-325 MG tablet Take 1 tablet by mouth 3 (three) times daily. Sometimes takes 4 times daily    . POTASSIUM PO Take 50 mg by mouth daily. OTC    . SYMBICORT 80-4.5 MCG/ACT inhaler INHALE 2 PUFFS BY MOUTH TWICE DAILY 1 Inhaler 11  . triamcinolone ointment (KENALOG) 0.5 % Apply 1 application topically 2 (two) times daily. 30 g 2  . VENTOLIN HFA 108 (90 Base) MCG/ACT inhaler INHALE 2 PUFFS BY MOUTH EVERY 4 HOURS AS NEEDED FOR WHEEZING 18 each 2  . Vitamin D, Ergocalciferol, (DRISDOL) 50000 units CAPS capsule Take 1 capsule (50,000 Units total) by mouth every 7 (seven) days. 8 capsule 0   No current facility-administered medications on file prior to visit.    Allergies  Allergen Reactions  . Eggs Or Egg-Derived Products Diarrhea    abd pain  . Lovastatin Other (See Comments)    Chest pain  . Naproxen Other (See Comments)    Chest pain/  SOB   . Toradol [Ketorolac Tromethamine] Other (See Comments)    Headache. Non-effective  . Tramadol Other (See Comments)    Headache. Non-effective.  Marland Kitchen Nexium [Esomeprazole Magnesium] Diarrhea    Abdominal pain   Social History   Socioeconomic History  . Marital status: Married    Spouse name: Patrick Jupiter  . Number of children: 2  . Years of education: GED  . Highest education level: Not on file  Occupational History    Comment: disabled  Social Needs  . Financial resource strain: Not on file  . Food insecurity:    Worry: Not on file    Inability: Not on file  . Transportation needs:    Medical: Not on file    Non-medical: Not on file  Tobacco Use  . Smoking status: Current Every Day Smoker    Packs/day: 0.00    Years: 30.00    Pack years: 0.00    Types: Cigarettes    Last attempt to quit: 01/28/2016    Years since quitting: 2.0  . Smokeless tobacco: Never Used  Substance and Sexual Activity  . Alcohol use: Not Currently    Comment: "beer here and there"  . Drug use: No  . Sexual activity: Not Currently    Birth control/protection: Post-menopausal  Lifestyle  . Physical activity:    Days per week: Not on file    Minutes per session: Not on file  . Stress: Not on file  Relationships  . Social connections:    Talks on phone: Not on file    Gets together: Not on file    Attends religious service: Not on file    Active member of club or organization: Not on file    Attends meetings of clubs or organizations: Not on file    Relationship status: Not on file  . Intimate partner violence:    Fear of current or ex partner: Not on file    Emotionally abused: Not on file    Physically abused: Not on file    Forced sexual activity: Not on file  Other Topics Concern  . Not on file  Social History Narrative   Lives with husband   no caffiene    Review of Systems  All other systems  reviewed and are negative.      Objective:   Physical Exam  Constitutional: She  appears well-developed and well-nourished. No distress.  Cardiovascular: Normal rate, regular rhythm and normal heart sounds.  Pulmonary/Chest: Effort normal and breath sounds normal. No stridor. No respiratory distress. She has no wheezes. She has no rales.  Skin: She is not diaphoretic.  Vitals reviewed.         Assessment & Plan:  Osteoporosis We discussed options including Fosamax which I believe is a poor option for this patient given her gastrointestinal issues, Prolia every 6 months, and even bone building therapy with evenity.  Given the increased risk of myocardial infarction, I am still somewhat reluctant to use evenity.  Therefore I have recommended that we pursue Prolia every 6 months through this clinic.  I will try to schedule this for the patient.  In addition I recommended that she continue calcium and vitamin D as prescribed by her gastroenterologist.  She does have a documented history to eggs.  However the allergy is GI upset.  She is able to eat free range eggs without any difficulty.  Therefore I do not believe she has a contraindication to a flu vaccine.  She received a flu shot today.

## 2018-02-26 ENCOUNTER — Ambulatory Visit (HOSPITAL_COMMUNITY)
Admission: RE | Admit: 2018-02-26 | Discharge: 2018-02-26 | Disposition: A | Discharge status: Home / Self Care | Payer: Medicare Other | Source: Ambulatory Visit | Attending: Family Medicine | Admitting: Family Medicine

## 2018-02-26 DIAGNOSIS — Z1231 Encounter for screening mammogram for malignant neoplasm of breast: Secondary | ICD-10-CM

## 2018-03-02 ENCOUNTER — Other Ambulatory Visit: Payer: Self-pay | Admitting: Family Medicine

## 2018-03-24 DIAGNOSIS — M542 Cervicalgia: Secondary | ICD-10-CM | POA: Diagnosis not present

## 2018-03-24 DIAGNOSIS — Z79891 Long term (current) use of opiate analgesic: Secondary | ICD-10-CM | POA: Diagnosis not present

## 2018-03-24 DIAGNOSIS — G894 Chronic pain syndrome: Secondary | ICD-10-CM | POA: Diagnosis not present

## 2018-03-24 DIAGNOSIS — M545 Low back pain: Secondary | ICD-10-CM | POA: Diagnosis not present

## 2018-03-24 DIAGNOSIS — R1084 Generalized abdominal pain: Secondary | ICD-10-CM | POA: Diagnosis not present

## 2018-04-06 ENCOUNTER — Ambulatory Visit (INDEPENDENT_AMBULATORY_CARE_PROVIDER_SITE_OTHER): Payer: Medicare Other

## 2018-04-06 DIAGNOSIS — M810 Age-related osteoporosis without current pathological fracture: Secondary | ICD-10-CM

## 2018-04-06 MED ORDER — DENOSUMAB 60 MG/ML ~~LOC~~ SOSY
60.0000 mg | PREFILLED_SYRINGE | SUBCUTANEOUS | Status: DC
  Administered 2018-04-06: 60 mg via SUBCUTANEOUS

## 2018-04-06 NOTE — Progress Notes (Signed)
Patient came in today to receive first dose of Prolia. Patient received Prolia in the left arm. Patient tolerated well. Advised to return in 6 months for next dose.

## 2018-04-07 DIAGNOSIS — R1032 Left lower quadrant pain: Secondary | ICD-10-CM | POA: Diagnosis not present

## 2018-04-07 DIAGNOSIS — M5442 Lumbago with sciatica, left side: Secondary | ICD-10-CM | POA: Diagnosis not present

## 2018-04-07 DIAGNOSIS — G8929 Other chronic pain: Secondary | ICD-10-CM | POA: Diagnosis not present

## 2018-04-07 DIAGNOSIS — I10 Essential (primary) hypertension: Secondary | ICD-10-CM | POA: Diagnosis not present

## 2018-04-07 DIAGNOSIS — R1030 Lower abdominal pain, unspecified: Secondary | ICD-10-CM | POA: Insufficient documentation

## 2018-04-07 NOTE — Patient Instructions (Signed)
[name] [name]  04/07/2018     @PREFPERIOPPHARMACY @   Your procedure is scheduled on  04/21/2018 .  Report to [facility] at  1115  A.M.  Call this number if you have problems the morning of surgery:  [phone]   Remember:  Follow the diet and prep instructions given to you by Dr Camelia Eng office.                 Take these medicines the morning of surgery with A SIP OF WATER  Amlodipine, dexilant, valium ( if needed), gabapentin, losartan, oxycodone ( if needed). Use your inhalers before you come.    Do not wear jewelry, make-up or nail polish.  Do not wear lotions, powders, or perfumes, or deodorant.  Do not shave 48 hours prior to surgery.  Men may shave face and neck.  Do not bring valuables to the hospital.  [facility] is not responsible for any belongings or valuables.  Contacts, dentures or bridgework may not be worn into surgery.  Leave your suitcase in the car.  After surgery it may be brought to your room.  For patients admitted to the hospital, discharge time will be determined by your treatment team.  Patients discharged the day of surgery will not be allowed to drive home.   Name and phone number of your driver:   Family Special instructions:  None  Please read over the following fact sheets that you were given. Anesthesia Post-op Instructions and Care and Recovery After Surgery       Colonoscopy, Adult A colonoscopy is an exam to look at the large intestine. It is done to check for problems, such as:  Lumps (tumors).  Growths (polyps).  Swelling (inflammation).  Bleeding.  What happens before the procedure? Eating and drinking Follow instructions from your doctor about eating and drinking. These instructions may include:  A few days before the procedure - follow a low-fiber diet. ? Avoid nuts. ? Avoid seeds. ? Avoid dried fruit. ? Avoid raw fruits. ? Avoid vegetables.  1-3 days before the procedure - follow a clear liquid  diet. Avoid liquids that have red or purple dye. Drink only clear liquids, such as: ? Clear broth or bouillon. ? Black coffee or tea. ? Clear juice. ? Clear soft drinks or sports drinks. ? Gelatin dessert. ? Popsicles.  On the day of the procedure - do not eat or drink anything during the 2 hours before the procedure.  Bowel prep If you were prescribed an oral bowel prep:  Take it as told by your doctor. Starting the day before your procedure, you will need to drink a lot of liquid. The liquid will cause you to poop (have bowel movements) until your poop is almost clear or light green.  If your skin or butt gets irritated from diarrhea, you may: ? Wipe the area with wipes that have medicine in them, such as adult wet wipes with aloe and vitamin E. ? Put something on your skin that soothes the area, such as petroleum jelly.  If you throw up (vomit) while drinking the bowel prep, take a break for up to 60 minutes. Then begin the bowel prep again. If you keep throwing up and you cannot take the bowel prep without throwing up, call your doctor.  General instructions  Ask your doctor about changing or stopping your normal medicines. This is important if you take diabetes medicines or blood thinners.  Plan to have someone take you home from the hospital or clinic. What happens during the procedure?  An IV tube may be put into one of your veins.  You will be given medicine to help you relax (sedative).  To reduce your risk of infection: ? Your doctors will wash their hands. ? Your anal area will be washed with soap.  You will be asked to lie on your side with your knees bent.  Your doctor will get a long, thin, flexible tube ready. The tube will have a camera and a light on the end.  The tube will be put into your anus.  The tube will be gently put into your large intestine.  Air will be delivered into your large intestine to keep it open. You may feel some pressure or  cramping.  The camera will be used to take photos.  A small tissue sample may be removed from your body to be looked at under a microscope (biopsy). If any possible problems are found, the tissue will be sent to a lab for testing.  If small growths are found, your doctor may remove them and have them checked for cancer.  The tube that was put into your anus will be slowly removed. The procedure may vary among doctors and hospitals. What happens after the procedure?  Your doctor will check on you often until the medicines you were given have worn off.  Do not drive for 24 hours after the procedure.  You may have a small amount of blood in your poop.  You may pass gas.  You may have mild cramps or bloating in your belly (abdomen).  It is up to you to get the results of your procedure. Ask your doctor, or the department performing the procedure, when your results will be ready. This information is not intended to replace advice given to you by your health care provider. Make sure you discuss any questions you have with your health care provider. Document Released: 05/25/2010 Document Revised: 02/21/2016 Document Reviewed: 07/04/2015 Elsevier Interactive Patient Education  2017 [employer]  Colonoscopy, Adult, Care After This sheet gives you information about how to care for yourself after your procedure. Your health care provider may also give you more specific instructions. If you have problems or questions, contact your health care provider. What can I expect after the procedure? After the procedure, it is common to have:  A small amount of blood in your stool for 24 hours after the procedure.  Some gas.  Mild abdominal cramping or bloating.  Follow these instructions at home: General instructions   For the first 24 hours after the procedure: ? Do not drive or use machinery. ? Do not sign important documents. ? Do not drink alcohol. ? Do your regular daily activities  at a slower pace than normal. ? Eat soft, easy-to-digest foods. ? Rest often.  Take over-the-counter or prescription medicines only as told by your health care provider.  It is up to you to get the results of your procedure. Ask your health care provider, or the department performing the procedure, when your results will be ready. Relieving cramping and bloating  Try walking around when you have cramps or feel bloated.  Apply heat to your abdomen as told by your health care provider. Use a heat source that your health care provider recommends, such as a moist heat pack or a heating pad. ? Place a towel between your skin and the heat source. ? Leave  the heat on for 20-30 minutes. ? Remove the heat if your skin turns bright red. This is especially important if you are unable to feel pain, heat, or cold. You may have a greater risk of getting burned. Eating and drinking  Drink enough fluid to keep your urine clear or pale yellow.  Resume your normal diet as instructed by your health care provider. Avoid heavy or fried foods that are hard to digest.  Avoid drinking alcohol for as long as instructed by your health care provider. Contact a health care provider if:  You have blood in your stool 2-3 days after the procedure. Get help right away if:  You have more than a small spotting of blood in your stool.  You pass large blood clots in your stool.  Your abdomen is swollen.  You have nausea or vomiting.  You have a fever.  You have increasing abdominal pain that is not relieved with medicine. This information is not intended to replace advice given to you by your health care provider. Make sure you discuss any questions you have with your health care provider. Document Released: 12/05/2003 Document Revised: 01/15/2016 Document Reviewed: 07/04/2015 Elsevier Interactive Patient Education  2018 [place] Anesthesia is a term that refers to techniques,  procedures, and medicines that help a person stay safe and comfortable during a medical procedure. Monitored anesthesia care, or sedation, is one type of anesthesia. Your anesthesia specialist may recommend sedation if you will be having a procedure that does not require you to be unconscious, such as:  Cataract surgery.  A dental procedure.  A biopsy.  A colonoscopy.  During the procedure, you may receive a medicine to help you relax (sedative). There are three levels of sedation:  Mild sedation. At this level, you may feel awake and relaxed. You will be able to follow directions.  Moderate sedation. At this level, you will be sleepy. You may not remember the procedure.  Deep sedation. At this level, you will be asleep. You will not remember the procedure.  The more medicine you are given, the deeper your level of sedation will be. Depending on how you respond to the procedure, the anesthesia specialist may change your level of sedation or the type of anesthesia to fit your needs. An anesthesia specialist will monitor you closely during the procedure. Let your health care provider know about:  Any allergies you have.  All medicines you are taking, including vitamins, herbs, eye drops, creams, and over-the-counter medicines.  Any use of steroids (by mouth or as a cream).  Any problems you or family members have had with sedatives and anesthetic medicines.  Any blood disorders you have.  Any surgeries you have had.  Any medical conditions you have, such as sleep apnea.  Whether you are pregnant or may be pregnant.  Any use of cigarettes, alcohol, or street drugs. What are the risks? Generally, this is a safe procedure. However, problems may occur, including:  Getting too much medicine (oversedation).  Nausea.  Allergic reaction to medicines.  Trouble breathing. If this happens, a breathing tube may be used to help with breathing. It will be removed when you are awake and  breathing on your own.  Heart trouble.  Lung trouble.  Before the procedure Staying hydrated Follow instructions from your health care provider about hydration, which may include:  Up to 2 hours before the procedure - you may continue to drink clear liquids, such as water, clear fruit  juice, black coffee, and plain tea.  Eating and drinking restrictions Follow instructions from your health care provider about eating and drinking, which may include:  8 hours before the procedure - stop eating heavy meals or foods such as meat, fried foods, or fatty foods.  6 hours before the procedure - stop eating light meals or foods, such as toast or cereal.  6 hours before the procedure - stop drinking milk or drinks that contain milk.  2 hours before the procedure - stop drinking clear liquids.  Medicines Ask your health care provider about:  Changing or stopping your regular medicines. This is especially important if you are taking diabetes medicines or blood thinners.  Taking medicines such as aspirin and ibuprofen. These medicines can thin your blood. Do not take these medicines before your procedure if your health care provider instructs you not to.  Tests and exams  You will have a physical exam.  You may have blood tests done to show: ? How well your kidneys and liver are working. ? How well your blood can clot.  General instructions  Plan to have someone take you home from the hospital or clinic.  If you will be going home right after the procedure, plan to have someone with you for 24 hours.  What happens during the procedure?  Your blood pressure, heart rate, breathing, level of pain and overall condition will be monitored.  An IV tube will be inserted into one of your veins.  Your anesthesia specialist will give you medicines as needed to keep you comfortable during the procedure. This may mean changing the level of sedation.  The procedure will be performed. After  the procedure  Your blood pressure, heart rate, breathing rate, and blood oxygen level will be monitored until the medicines you were given have worn off.  Do not drive for 24 hours if you received a sedative.  You may: ? Feel sleepy, clumsy, or nauseous. ? Feel forgetful about what happened after the procedure. ? Have a sore throat if you had a breathing tube during the procedure. ? Vomit. This information is not intended to replace advice given to you by your health care provider. Make sure you discuss any questions you have with your health care provider. Document Released: 01/16/2005 Document Revised: 09/29/2015 Document Reviewed: 08/13/2015 Elsevier Interactive Patient Education  2018 [place], Care After These instructions provide you with information about caring for yourself after your procedure. Your health care provider may also give you more specific instructions. Your treatment has been planned according to current medical practices, but problems sometimes occur. Call your health care provider if you have any problems or questions after your procedure. What can I expect after the procedure? After your procedure, it is common to:  Feel sleepy for several hours.  Feel clumsy and have poor balance for several hours.  Feel forgetful about what happened after the procedure.  Have poor judgment for several hours.  Feel nauseous or vomit.  Have a sore throat if you had a breathing tube during the procedure.  Follow these instructions at home: For at least 24 hours after the procedure:   Do not: ? Participate in activities in which you could fall or become injured. ? Drive. ? Use heavy machinery. ? Drink alcohol. ? Take sleeping pills or medicines that cause drowsiness. ? Make important decisions or sign legal documents. ? Take care of children on your own.  Rest. Eating and drinking  Follow  the diet that is recommended by your health  care provider.  If you vomit, drink water, juice, or soup when you can drink without vomiting.  Make sure you have little or no nausea before eating solid foods. General instructions  Have a responsible adult stay with you until you are awake and alert.  Take over-the-counter and prescription medicines only as told by your health care provider.  If you smoke, do not smoke without supervision.  Keep all follow-up visits as told by your health care provider. This is important. Contact a health care provider if:  You keep feeling nauseous or you keep vomiting.  You feel light-headed.  You develop a rash.  You have a fever. Get help right away if:  You have trouble breathing. This information is not intended to replace advice given to you by your health care provider. Make sure you discuss any questions you have with your health care provider. Document Released: 08/13/2015 Document Revised: 12/13/2015 Document Reviewed: 08/13/2015 Elsevier Interactive Patient Education  [employer].

## 2018-04-13 ENCOUNTER — Ambulatory Visit: Payer: Medicare Other

## 2018-04-14 ENCOUNTER — Other Ambulatory Visit: Payer: Self-pay

## 2018-04-14 ENCOUNTER — Encounter (HOSPITAL_COMMUNITY)
Admission: RE | Admit: 2018-04-14 | Discharge: 2018-04-14 | Disposition: A | Discharge status: Home / Self Care | Payer: Medicare Other | Source: Ambulatory Visit | Attending: Gastroenterology | Admitting: Gastroenterology

## 2018-04-14 ENCOUNTER — Encounter (HOSPITAL_COMMUNITY): Payer: Self-pay

## 2018-04-14 ENCOUNTER — Other Ambulatory Visit (HOSPITAL_COMMUNITY): Payer: Self-pay | Admitting: Student

## 2018-04-14 DIAGNOSIS — Z01818 Encounter for other preprocedural examination: Secondary | ICD-10-CM | POA: Insufficient documentation

## 2018-04-14 DIAGNOSIS — M545 Low back pain, unspecified: Secondary | ICD-10-CM

## 2018-04-14 LAB — ETHANOL: Alcohol, Ethyl (B): <10 mg/dL (ref <10)

## 2018-04-16 ENCOUNTER — Encounter: Payer: Self-pay | Admitting: "Endocrinology

## 2018-04-16 ENCOUNTER — Ambulatory Visit (INDEPENDENT_AMBULATORY_CARE_PROVIDER_SITE_OTHER): Payer: Medicare Other | Admitting: "Endocrinology

## 2018-04-16 VITALS — BP 126/79 | HR 91 | Ht 60.0 in | Wt 104.0 lb

## 2018-04-16 DIAGNOSIS — M810 Age-related osteoporosis without current pathological fracture: Secondary | ICD-10-CM

## 2018-04-16 MED ORDER — CALCIUM CARBONATE ANTACID 500 MG PO CHEW: 1.0000 | CHEWABLE_TABLET | Freq: Three times a day (TID) | ORAL | 6 refills | Status: DC

## 2018-04-16 MED ORDER — VITAMIN D3 125 MCG (5000 UT) PO CAPS: 5000.0000 [IU] | ORAL_CAPSULE | Freq: Every day | ORAL | 1 refills | Status: DC

## 2018-04-16 NOTE — Progress Notes (Signed)
Endocrinology Consulte Note  Past Medical History:  Diagnosis Date  . Allergic rhinitis   . Anastomotic ulcer JAN 2009  . Anxiety   . Asthma   . BMI (body mass index) 20.0-29.9 2009 121 lbs  . Concussion   . 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 Crohn disease ([place])   . Crohn disease ([place])   . CTS (carpal tunnel syndrome)   . Diarrhea MULITIFACTORIAL   IBS, LACTOSE INTOLERANCE, SBBO, BILE-SALT  . Elevated liver enzymes 2009: BMI 24 ?Etoh ALT 94, AST 51,  NEG ANA, qIGs& ASMA   MAY 2012 AST 52 ALT 38  . Esophageal stricture 2009  . GERD (gastroesophageal reflux disease)   . Hemorrhoid   . Hyperlipemia   . Hypertension   . Inflammatory bowel disease 2006 CD   SURGICAL REMISSION  . LBP (low back pain)   . Mental disorder   . Migraine   . Osteoporosis   . PONV (postoperative nausea and vomiting)   . Shortness of breath    exertion and humidity  . Shoulder pain    right  . Sleep apnea    Stop [name]   Past Surgical History:  Procedure Laterality Date  . BACK SURGERY    . BILATERAL SALPINGOOPHORECTOMY    . BIOPSY  12/24/2011   Procedure: BIOPSY;  Surgeon: Danie Binder, MD;  Location: [facility];  Service: Endoscopy;;  Gastric Biopsies  . BIOPSY N/A 06/30/2012   Procedure: BIOPSY;  Surgeon: Danie Binder, MD;  Location: [facility];  Service: Endoscopy;  Laterality: N/A;  Gastric and Esophageal Biopsies  . CARPAL TUNNEL RELEASE  LEFT  . CATARACT EXTRACTION W/PHACO Right 07/30/2016   Procedure: CATARACT EXTRACTION PHACO AND INTRAOCULAR LENS PLACEMENT (IOC);  Surgeon: Rutherford Guys, MD;  Location: [facility];  Service: Ophthalmology;  Laterality: Right;  CDE: 5.45  . CATARACT EXTRACTION W/PHACO Left 08/13/2016   Procedure: CATARACT EXTRACTION PHACO AND INTRAOCULAR LENS PLACEMENT (IOC);  Surgeon: Rutherford Guys, MD;  Location: [facility];  Service: Ophthalmology;  Laterality: Left;  CDE: 5.59  . COLON SURGERY    .  COLONOSCOPY  JAN 2009 DIARRHEA, ABD PAIN, BRBPR   ANASTOMOTIC ULCER(3MM), IH IR:SWNIOE ULCER, NL COLON bX  . DILATION AND CURETTAGE OF UTERUS    . ESOPHAGEAL DILATION  12/24/2011   SLF: A stricture was found in the distal esophagus/ Moderate gastritis/ MILD Duodenitis  . ESOPHAGOGASTRODUODENOSCOPY  02/07/09   mild gastritis  . ESOPHAGOGASTRODUODENOSCOPY (EGD) WITH PROPOFOL N/A 06/30/2012   SLF: 1. No definite stricture appreciated 2. small hiatal hernia 3. Gastritis  . ESOPHAGOGASTRODUODENOSCOPY (EGD) WITH PROPOFOL N/A 02/20/2016   Procedure: ESOPHAGOGASTRODUODENOSCOPY (EGD) WITH PROPOFOL;  Surgeon: Danie Binder, MD;  [facility];  Service: Endoscopy;  Laterality: N/A;  930  . gum flap Bilateral   . HEMICOLECTOMY  RIGHT 2006  . HERNIA REPAIR    . LUMBAR DISC SURGERY    . SAVORY DILATION N/A 06/30/2012   Procedure: SAVORY DILATION;  Surgeon: Danie Binder, MD;  Location: [facility];  Service: Endoscopy;  Laterality: N/A;  12.44m , 188m 15106m. SAVORY DILATION N/A 02/20/2016   Procedure: SAVORY DILATION;  Surgeon: SanDanie BinderD;  [facility];  Service: Endoscopy;  Laterality: N/A;  . UPPER GASTROINTESTINAL ENDOSCOPY  JAN 2009 ABD PAIN   GASTRITIS, ESO RING  . UPPER GASTROINTESTINAL ENDOSCOPY  OCT 2010 ABD PAIN, DYSPHAGIA   DIL  15MM, GASTRITIS, NL DUODENUM  . UPPER GASTROINTESTINAL ENDOSCOPY  OCT 2010 DYSPHAGIA   DIL 17 MM, GASTRITIS, NL DUODENUM  . vocal cord surgery  Sep 20, 2010   precancerous areas removed  . wisdom tooth extraction Right    pin placement due to broken jaw   Social History   Socioeconomic History  . Marital status: Single    Spouse name: Brownlee  . Number of children: 2  . Years of education: GED  . Highest education level: Not on file  Occupational History    Comment: disabled  Social Needs  . Financial resource strain: Not on file  . Food insecurity:    Worry: Not on file    Inability: Not on file  . Transportation needs:     Medical: Not on file    Non-medical: Not on file  Tobacco Use  . Smoking status: Current Every Day Smoker    Packs/day: 1.00    Years: 30.00    Pack years: 30.00    Types: Cigarettes  . Smokeless tobacco: Never Used  Substance and Sexual Activity  . Alcohol use: Yes    Comment: "beer here and there"  . Drug use: No  . Sexual activity: Not Currently    Birth control/protection: Post-menopausal  Lifestyle  . Physical activity:    Days per week: Not on file    Minutes per session: Not on file  . Stress: Not on file  Relationships  . Social connections:    Talks on phone: Not on file    Gets together: Not on file    Attends religious service: Not on file    Active member of club or organization: Not on file    Attends meetings of clubs or organizations: Not on file    Relationship status: Not on file  Other Topics Concern  . Not on file  Social History Narrative   Lives with husband   no caffiene   Outpatient Encounter Medications as of 04/16/2018  Medication Sig  . amLODipine (NORVASC) 5 MG tablet TAKE 1 TABLET BY MOUTH ONCE DAILY (Patient taking differently: Take 5 mg by mouth daily. )  . benzonatate (TESSALON) 200 MG capsule Take 200 mg by mouth 3 (three) times daily as needed for cough.  . calcium carbonate (TUMS - DOSED IN MG ELEMENTAL CALCIUM) 500 MG chewable tablet Chew 1 tablet (200 mg of elemental calcium total) by mouth 3 (three) times daily with meals.  . Cholecalciferol (VITAMIN D3) 125 MCG (5000 UT) CAPS Take 1 capsule (5,000 Units total) by mouth daily.  . cholestyramine (QUESTRAN) 4 GM/DOSE powder DISSOLVE AND TAKE 2 GRAMS BY MOUTH TWICE DAILY WITH A MEAL- DO NOT TAKE WITHIN 2 HOURS OF OTHER MEDICATIONS (Patient taking differently: Take 4 g by mouth daily. )  . cyclobenzaprine (FLEXERIL) 10 MG tablet Take 10 mg by mouth daily as needed for muscle spasms.   Marland Kitchen denosumab (PROLIA) 60 MG/ML SOSY injection Inject 60 mg into the skin every 6 (six) months.  .  dexlansoprazole (DEXILANT) 60 MG capsule 1 PO EVERY MORNING WITH BREAKFAST. (Patient taking differently: Take 60 mg by mouth daily as needed (heartburn). 1 PO EVERY MORNING WITH BREAKFAST.)  . diazepam (VALIUM) 5 MG tablet TAKE 1 TABLET BY MOUTH EVERY 12 HOURS AS NEEDED FOR ANXIETY (Patient taking differently: Take 5 mg by mouth every 12 (twelve) hours as needed for anxiety. )  . fluticasone (FLONASE) 50 MCG/ACT nasal spray USE TWO SPRAY(S) IN EACH NOSTRIL ONCE DAILY AS  NEEDED FOR ALLERGY OR RHINITIS (Patient taking differently: Place 2 sprays into both nostrils daily as needed for allergies or rhinitis. )  . gabapentin (NEURONTIN) 400 MG capsule Take 400 mg by mouth 3 (three) times daily.   Marland Kitchen nystatin (MYCOSTATIN) 100000 UNIT/ML suspension TAKE 5 ML BY MOUTH 4 TIMES DAILY AS NEEDED (Patient taking differently: Take 5 mLs by mouth daily as needed (thrush). TAKE  5 ML BY MOUTH 4 TIMES DAILY AS NEEDED)  . oxyCODONE-acetaminophen (PERCOCET) 10-325 MG tablet Take 1 tablet by mouth 4 (four) times daily as needed for pain.   Marland Kitchen POTASSIUM PO Take 50 mg by mouth daily.   . Probiotic Product ([place]) CAPS Take 1 capsule by mouth daily.  . SYMBICORT 80-4.5 MCG/ACT inhaler INHALE 2 PUFFS BY MOUTH TWICE DAILY (Patient taking differently: Inhale 2 puffs into the lungs daily as needed (wheezing / shortness of breath). )  . triamcinolone ointment (KENALOG) 0.5 % Apply 1 application topically 2 (two) times daily. (Patient taking differently: Apply 1 application topically daily as needed (sores). )  . Turmeric 500 MG CAPS Take 500 mg by mouth daily as needed (inflammation).   . VENTOLIN HFA 108 (90 Base) MCG/ACT inhaler INHALE 2 PUFFS BY MOUTH EVERY 4 HOURS AS NEEDED FOR WHEEZING (Patient taking differently: Inhale 2 puffs into the lungs every 4 (four) hours as needed for wheezing or shortness of breath. INHALE 2 PUFFS BY MOUTH EVERY 4 HOURS AS NEEDED FOR WHEEZING)  . Vitamin D, Ergocalciferol, (DRISDOL)  50000 units CAPS capsule Take 1 capsule (50,000 Units total) by mouth every 7 (seven) days. (Patient not taking: Reported on 04/16/2018)  . [DISCONTINUED] calcium carbonate (OSCAL) 1500 (600 Ca) MG TABS tablet Take 600 mg of elemental calcium by mouth daily with breakfast.  . [DISCONTINUED] calcium carbonate (TUMS - DOSED IN MG ELEMENTAL CALCIUM) 500 MG chewable tablet Chew 1-2 tablets by mouth every other day.   . [DISCONTINUED] losartan-hydrochlorothiazide (HYZAAR) 50-12.5 MG tablet Take 1 tablet by mouth daily. (Patient not taking: Reported on 04/10/2018)   Facility-Administered Encounter Medications as of 04/16/2018  Medication  . denosumab (PROLIA) injection 60 mg   ALLERGIES: Allergies  Allergen Reactions  . Naproxen Shortness Of Breath    Chest pain/ SOB   . Eggs Or Egg-Derived Products Diarrhea    abd pain  . Lovastatin Other (See Comments)    Chest pain  . Toradol [Ketorolac Tromethamine] Other (See Comments)    Headache. Non-effective  . Tramadol Other (See Comments)    Headache. Non-effective.  Marland Kitchen Nexium [Esomeprazole Magnesium] Diarrhea    Abdominal pain    VACCINATION STATUS: Immunization History  Administered Date(s) Administered  . DT 02/17/2017  . H1N1 03/22/2008  . Influenza Whole 02/18/2006, 02/03/2007, 02/18/2008, 02/15/2010  . Influenza,inj,Quad PF,6+ Mos 04/13/2013, 02/22/2014, 01/09/2016, 02/24/2018  . Influenza-Unspecified 02/21/2017  . Pneumococcal Polysaccharide-23 02/18/2006, 08/08/2015  . Td 06/12/2009  . Tdap 08/08/2015, 02/21/2017  . Zoster 08/08/2015  . Zoster Recombinat (Shingrix) 02/19/2017, 05/20/2017     HPI   [name] [name] is 62 y.o. female who presents today with a medical history as above. she is being seen in consultation for age osteoporosis requested by Alycia Rossetti, MD.  Patient was diagnosed with osteoporosis  approximately 50 years ago.  Was treated with various forms of osteoporosis medications including oral  bisphosphonates, Reclast IV x2 years.  She was recently switched to Prolia, first dose March 07, 2018.  She has multiple medical problems including Crohn's disease which required  high-dose steroids in the past, chronic active smoking.    -She denies any prior history of fragility fractures  I reviewed pt's DEXA scans: Over a period of 5 years from 2014 06/2017 her spinal T score improved from -4.2 to -3.9.  -She has chronic profound vitamin D deficiency, not taking her current supplies due to difficulty swallowing large capsule.  She is also not taking her calcium supplements. -She attempts to stay active, largely avoiding contact sports.  No prior history of hyperparathyroidism, hyperthyroidism. No h/o CKD. Last BUN/Cr: Lab Results  Component Value Date   BUN 5 (L) 12/23/2017   CREATININE 0.47 (L) 12/23/2017    -She has family history of osteoporosis in her mother.    I reviewed her chart and she also has a history of GERD, esophageal stricture.  -She denies any loss of height.  Review of Systems  Constitutional: no weight gain/loss, no fatigue, no subjective hyperthermia, no subjective hypothermia Eyes: no blurry vision, no xerophthalmia ENT: no sore throat, no nodules palpated in throat, no dysphagia/odynophagia, no hoarseness Cardiovascular: no Chest Pain, no Shortness of Breath, no palpitations, no leg swelling Respiratory: no cough, no SOB Gastrointestinal: no Nausea/Vomiting/Diarhhea Musculoskeletal: no muscle/joint aches Skin: no rashes Neurological: no tremors, no numbness, no tingling, no dizziness Psychiatric: no depression, no anxiety  Objective:    BP 126/79   Pulse 91   Ht 5' (1.524 m)   Wt 104 lb (47.2 kg)   BMI 20.31 kg/m   Wt Readings from Last 3 Encounters:  04/16/18 104 lb (47.2 kg)  04/14/18 101 lb (45.8 kg)  02/24/18 102 lb 6.4 oz (46.4 kg)    Physical Exam  Constitutional:  + Light build, not in acute distress, normal state of mind Eyes: PERRLA,  EOMI, no exophthalmos ENT: moist mucous membranes, no thyromegaly, no cervical lymphadenopathy Cardiovascular: normal precordial activity, Regular Rate and Rhythm, no Murmur/Rubs/Gallops Respiratory:  adequate breathing efforts, no gross chest deformity, Clear to auscultation bilaterally Gastrointestinal: abdomen soft, Non -tender, No distension, Bowel Sounds present Musculoskeletal: no gross deformities, strength intact in all four extremities Skin: moist, warm, no rashes Neurological: no tremor with outstretched hands, Deep tendon reflexes normal in all four extremities.  CMP ( most recent) CMP     Component Value Date/Time   NA 140 12/23/2017 1150   K 4.0 12/23/2017 1150   CL 103 12/23/2017 1150   CO2 26 12/23/2017 1150   GLUCOSE 93 12/23/2017 1150   BUN 5 (L) 12/23/2017 1150   CREATININE 0.47 (L) 12/23/2017 1150   CALCIUM 8.7 12/23/2017 1150   PROT 6.8 12/23/2017 1150   ALBUMIN 4.3 06/24/2016 1013   AST 42 (H) 12/23/2017 1150   ALT 30 (H) 12/23/2017 1150   ALKPHOS 96 06/24/2016 1013   BILITOT 0.4 12/23/2017 1150   GFRNONAA >60 05/21/2016 1234   GFRAA >60 05/21/2016 1234    Lipid Panel     Component Value Date/Time   CHOL 246 (H) 06/25/2017 1113   TRIG 82 06/25/2017 1113   HDL 149 06/25/2017 1113   CHOLHDL 1.7 06/25/2017 1113   VLDL 11 06/24/2016 1013   LDLCALC 81 06/25/2017 1113   LDLDIRECT 29 03/11/2011 1315      Lab Results  Component Value Date   TSH 1.010 12/27/2011   TSH 1.369 03/11/2011   TSH 1.369 11/16/2009   TSH 2.702 12/22/2008   TSH 0.966 02/06/2007   TSH 1.045 01/16/2006      Assessment: 1. Osteoporosis  Plan: 1. Osteoporosis - likely  multifactorial including exposure to large dose steroids, chronic heavy smoking, postmenopausal status, inadequate calcium/vitamin D supplements   - Discussed about increased risk of fracture, depending on the T score, greatly increased when the T score is lower than -2.5.  We reviewed her DEXA scans together,  and I explained that based on the T scores, she has an increased risk for fractures, although her T score is slowly improving over time. - We discussed about the different medication classes, benefits and side effects (including atypical fractures and ONJ - no dental workup in progress or planned).  - I explained that, since she has severe GERD, she is not a candidate for oral bisphosphonates, she has taken Reclast for 2 years before see what she was switched to Prolia.  She only took 1 dose of Prolia so far on March 07, 2018.  -I advised her to remain on Prolia for various reasons.  Her next dose will be on Sep 05, 2018.  She has already arranged to get her prescription delivered to Dr. Dorian Heckle  office, and has appointment for her next injection.  - we reviewed her dietary and supplemental calcium and vitamin D intake, which I believe are inadequate.  Patient has been hesitant to take larger capsules and pills.   -I switched her vitamin D prescription to vitamin D3 5000 unit capsules daily for the next 90 days given her profound hypovitaminosis D of 9 on recent labs .  I also discussed and prescribed calcium carbonate 1250 mg (to give elemental calcium of 500 mg x 3 ) until her next labs in 3 to 4 months.     - We discussed fall precautions : She is advised to avoid contact sports, vigorous exercise such as flipping, running.  However continue on light weightbearing exercises 5 to 7 days a week, walking, water aerobics. - we discussed about maintaining a good amount of protein in her diet, advised to quit smoking, avoid alcohol if possible.   - We will check the following tests:  Vitamin D, CMP, phosphorus, PTH/calcium.  She will also need thyroid function test during her annual physical.  -Her next bone density is not until October 2021, however patient will return in 6 months, about the time of her next scheduled Prolia injection. - I advised patient to maintain close follow up with Alycia Rossetti, MD for primary care needs.  - Time spent with the patient: 35 minutes, of which >50% was spent in obtaining information about her symptoms, reviewing her previous labs, evaluations, and treatments, counseling her about her osteoporosis, and developing a plan to confirm the diagnosis and long term treatment as necessary.  John Giovanni participated in the discussions, expressed understanding, and voiced agreement with the above plans.  All questions were answered to her satisfaction. she is encouraged to contact clinic should she have any questions or concerns prior to her return visit.  Follow up plan: Return in about 6 months (around 10/16/2018), or Prolia Injection at Dr. Buelah Manis in May 2020, for Follow up with Pre-visit Labs.   Glade Lloyd, MD [facility] [address] [place], [place] [address] Phone: [phone]  Fax: [phone]     04/16/2018, 5:01 PM  This note was partially dictated with voice recognition software. Similar sounding words can be transcribed inadequately or may not  be corrected upon review.

## 2018-04-21 ENCOUNTER — Ambulatory Visit (HOSPITAL_COMMUNITY): Payer: Medicare Other | Admitting: Anesthesiology

## 2018-04-21 ENCOUNTER — Other Ambulatory Visit: Payer: Self-pay

## 2018-04-21 ENCOUNTER — Ambulatory Visit (HOSPITAL_COMMUNITY)
Admission: RE | Admit: 2018-04-21 | Discharge: 2018-04-21 | Disposition: A | Discharge status: Home / Self Care | Payer: Medicare Other | Attending: Gastroenterology | Admitting: Gastroenterology

## 2018-04-21 ENCOUNTER — Encounter (HOSPITAL_COMMUNITY): Payer: Self-pay

## 2018-04-21 ENCOUNTER — Encounter (HOSPITAL_COMMUNITY)
Admission: RE | Disposition: A | Discharge status: Home / Self Care | Payer: Self-pay | Source: Home / Self Care | Attending: Gastroenterology

## 2018-04-21 DIAGNOSIS — Z791 Long term (current) use of non-steroidal anti-inflammatories (NSAID): Secondary | ICD-10-CM | POA: Diagnosis not present

## 2018-04-21 DIAGNOSIS — I10 Essential (primary) hypertension: Secondary | ICD-10-CM | POA: Diagnosis not present

## 2018-04-21 DIAGNOSIS — D123 Benign neoplasm of transverse colon: Secondary | ICD-10-CM | POA: Diagnosis not present

## 2018-04-21 DIAGNOSIS — D128 Benign neoplasm of rectum: Secondary | ICD-10-CM | POA: Insufficient documentation

## 2018-04-21 DIAGNOSIS — Z7983 Long term (current) use of bisphosphonates: Secondary | ICD-10-CM | POA: Insufficient documentation

## 2018-04-21 DIAGNOSIS — J449 Chronic obstructive pulmonary disease, unspecified: Secondary | ICD-10-CM | POA: Insufficient documentation

## 2018-04-21 DIAGNOSIS — K644 Residual hemorrhoidal skin tags: Secondary | ICD-10-CM | POA: Diagnosis not present

## 2018-04-21 DIAGNOSIS — Z1211 Encounter for screening for malignant neoplasm of colon: Secondary | ICD-10-CM

## 2018-04-21 DIAGNOSIS — F17210 Nicotine dependence, cigarettes, uncomplicated: Secondary | ICD-10-CM | POA: Diagnosis not present

## 2018-04-21 DIAGNOSIS — K648 Other hemorrhoids: Secondary | ICD-10-CM | POA: Diagnosis not present

## 2018-04-21 DIAGNOSIS — Z79899 Other long term (current) drug therapy: Secondary | ICD-10-CM | POA: Diagnosis not present

## 2018-04-21 DIAGNOSIS — K621 Rectal polyp: Secondary | ICD-10-CM

## 2018-04-21 DIAGNOSIS — K219 Gastro-esophageal reflux disease without esophagitis: Secondary | ICD-10-CM | POA: Diagnosis not present

## 2018-04-21 DIAGNOSIS — F419 Anxiety disorder, unspecified: Secondary | ICD-10-CM | POA: Insufficient documentation

## 2018-04-21 DIAGNOSIS — K5730 Diverticulosis of large intestine without perforation or abscess without bleeding: Secondary | ICD-10-CM | POA: Insufficient documentation

## 2018-04-21 DIAGNOSIS — E785 Hyperlipidemia, unspecified: Secondary | ICD-10-CM | POA: Diagnosis not present

## 2018-04-21 HISTORY — PX: POLYPECTOMY: SHX5525

## 2018-04-21 HISTORY — PX: COLONOSCOPY WITH PROPOFOL: SHX5780

## 2018-04-21 SURGERY — COLONOSCOPY WITH PROPOFOL
Anesthesia: Monitor Anesthesia Care

## 2018-04-21 MED ORDER — PROPOFOL 10 MG/ML IV BOLUS
INTRAVENOUS | Status: AC
  Filled 2018-04-21: qty 20

## 2018-04-21 MED ORDER — MIDAZOLAM HCL 5 MG/5ML IJ SOLN
INTRAMUSCULAR | Status: DC | PRN
  Administered 2018-04-21: 2 mg via INTRAVENOUS

## 2018-04-21 MED ORDER — CHLORHEXIDINE GLUCONATE CLOTH 2 % EX PADS: 6.0000 | MEDICATED_PAD | Freq: Once | CUTANEOUS | Status: DC

## 2018-04-21 MED ORDER — ONDANSETRON HCL 4 MG/2ML IJ SOLN
INTRAMUSCULAR | Status: DC | PRN
  Administered 2018-04-21: 4 mg via INTRAVENOUS

## 2018-04-21 MED ORDER — KETAMINE HCL 10 MG/ML IJ SOLN
INTRAMUSCULAR | Status: DC | PRN
  Administered 2018-04-21 (×2): 10 mg via INTRAVENOUS

## 2018-04-21 MED ORDER — STERILE WATER FOR IRRIGATION IR SOLN
Status: DC | PRN
  Administered 2018-04-21: 1.5 mL

## 2018-04-21 MED ORDER — LACTATED RINGERS IV SOLN
INTRAVENOUS | Status: DC
  Administered 2018-04-21: 12:00:00 via INTRAVENOUS

## 2018-04-21 MED ORDER — PROPOFOL 500 MG/50ML IV EMUL
INTRAVENOUS | Status: DC | PRN
  Administered 2018-04-21: 150 ug/kg/min via INTRAVENOUS
  Administered 2018-04-21: 13:00:00 via INTRAVENOUS

## 2018-04-21 MED ORDER — KETAMINE HCL 50 MG/5ML IJ SOSY
PREFILLED_SYRINGE | INTRAMUSCULAR | Status: AC
  Filled 2018-04-21: qty 5

## 2018-04-21 MED ORDER — PROPOFOL 10 MG/ML IV BOLUS
INTRAVENOUS | Status: DC | PRN
  Administered 2018-04-21 (×3): 15 mg via INTRAVENOUS

## 2018-04-21 MED ORDER — ONDANSETRON HCL 4 MG/2ML IJ SOLN
INTRAMUSCULAR | Status: AC
  Filled 2018-04-21: qty 2

## 2018-04-21 MED ORDER — MIDAZOLAM HCL 2 MG/2ML IJ SOLN
INTRAMUSCULAR | Status: AC
  Filled 2018-04-21: qty 2

## 2018-04-21 NOTE — Transfer of Care (Signed)
Immediate Anesthesia Transfer of Care Note  Patient: [name] [name]  Procedure(s) Performed: COLONOSCOPY WITH PROPOFOL (N/A ) POLYPECTOMY  Patient Location: PACU  Anesthesia Type:General  Level of Consciousness: awake and patient cooperative  Airway & Oxygen Therapy: Patient Spontanous Breathing  Post-op Assessment: Report given to RN, Post -op Vital signs reviewed and stable and Patient moving all extremities  Post vital signs: Reviewed and stable  Last Vitals:  Vitals Value Taken Time  BP    Temp    Pulse    Resp    SpO2      Last Pain:  Vitals:   04/21/18 1302  TempSrc:   PainSc: 6          Complications: No apparent anesthesia complications

## 2018-04-21 NOTE — Anesthesia Preprocedure Evaluation (Signed)
Anesthesia Evaluation  Patient identified by MRN, date of birth, ID band Patient awake    Reviewed: Allergy & Precautions, H&P , NPO status , Patient's Chart, lab work & pertinent test results, reviewed documented beta blocker date and time   History of Anesthesia Complications (+) PONV and history of anesthetic complications  Airway Mallampati: I  TM Distance: >3 FB Neck ROM: full    Dental no notable dental hx.    Pulmonary shortness of breath, asthma , sleep apnea , COPD, Current Smoker,    Pulmonary exam normal breath sounds clear to auscultation       Cardiovascular Exercise Tolerance: Good hypertension, negative cardio ROS   Rhythm:regular Rate:Normal     Neuro/Psych  Headaches, PSYCHIATRIC DISORDERS Anxiety Depression  Neuromuscular disease    GI/Hepatic Neg liver ROS, GERD  ,  Endo/Other  negative endocrine ROS  Renal/GU negative Renal ROS  negative genitourinary   Musculoskeletal   Abdominal   Peds  Hematology negative hematology ROS (+)   Anesthesia Other Findings   Reproductive/Obstetrics negative OB ROS                             Anesthesia Physical Anesthesia Plan  ASA: III  Anesthesia Plan: MAC   Post-op Pain Management:    Induction:   PONV Risk Score and Plan:   Airway Management Planned:   Additional Equipment:   Intra-op Plan:   Post-operative Plan:   Informed Consent: I have reviewed the patients History and Physical, chart, labs and discussed the procedure including the risks, benefits and alternatives for the proposed anesthesia with the patient or authorized representative who has indicated his/her understanding and acceptance.   Dental Advisory Given  Plan Discussed with: CRNA  Anesthesia Plan Comments:         Anesthesia Quick Evaluation

## 2018-04-21 NOTE — Op Note (Signed)
[facility] Patient Name: [name] [name] Procedure Date: 04/21/2018 12:34 PM MRN: [id number] Date of Birth: 1955-08-08 Attending MD: Barney Drain MD, MD CSN: [id number] Age: 62 Admit Type: Outpatient Procedure:                Colonoscopy WITH COLD SNARE POLYPECTOMY Indications:              Screening for colorectal malignant neoplasm Providers:                Barney Drain MD, MD, Aram Candela, Gerome Sam,                            RN Referring MD:             Modena Nunnery. [place] Medicines:                Propofol per Anesthesia Complications:            No immediate complications. Estimated Blood Loss:     Estimated blood loss was minimal. Procedure:                Pre-Anesthesia Assessment:                           - Prior to the procedure, a History and Physical                            was performed, and patient medications and                            allergies were reviewed. The patient's tolerance of                            previous anesthesia was also reviewed. The risks                            and benefits of the procedure and the sedation                            options and risks were discussed with the patient.                            All questions were answered, and informed consent                            was obtained. Prior Anticoagulants: The patient has                            taken no previous anticoagulant or antiplatelet                            agents. ASA Grade Assessment: II - A patient with                            mild systemic disease. After reviewing the risks  and benefits, the patient was deemed in                            satisfactory condition to undergo the procedure.                            After obtaining informed consent, the colonoscope                            was passed under direct vision. Throughout the                            procedure, the patient's blood pressure, pulse, and                             oxygen saturations were monitored continuously. The                            [id number]-[id number] ([id number]) scope was introduced through                            the anus and advanced to the the ileocolonic                            anastomosis. The colonoscopy was technically                            difficult and complex due to significant looping.                            Successful completion of the procedure was aided by                            straightening and shortening the scope to obtain                            bowel loop reduction and COLOWRAP. The patient                            tolerated the procedure well. The quality of the                            bowel preparation was excellent. The terminal ileum                            and the rectum were photographed. Scope In: 1:14:06 PM Scope Out: 1:36:33 PM Scope Withdrawal Time: 0 hours 12 minutes 59 seconds  Total Procedure Duration: 0 hours 22 minutes 27 seconds  Findings:      The terminal ileum appeared normal.      Two sessile polyps were found in the rectum and transverse colon. The       polyps were 3 to 5 mm in size. These polyps were removed with a cold       snare. Resection and retrieval  were complete.      Multiple small and large-mouthed diverticula were found in the       recto-sigmoid colon and sigmoid colon.      External and internal hemorrhoids were found. The hemorrhoids were small.      The recto-sigmoid colon, sigmoid colon and descending colon revealed       significantly excessive looping. Impression:               - The examined portion of the ileum was normal.                           - Two 3 to 5 mm polyps in the rectum and in the                            transverse colon, removed with a cold snare.                            Resected and retrieved.                           - Diverticulosis in the recto-sigmoid colon and in                            the  sigmoid colon.                           - External and internal hemorrhoids.                           - There was significant looping of the colon. Moderate Sedation:      Per Anesthesia Care Recommendation:           - Patient has a contact number available for                            emergencies. The signs and symptoms of potential                            delayed complications were discussed with the                            patient. Return to normal activities tomorrow.                            Written discharge instructions were provided to the                            patient.                           - High fiber diet.                           - Continue present medications.                           -  Await pathology results.                           - Repeat colonoscopy in 5-10 years for surveillance                            WITH PEDS COLONOSCOPE. Procedure Code(s):        --- Professional ---                           [phone], Colonoscopy, flexible; with removal of                            tumor(s), polyp(s), or other lesion(s) by snare                            technique Diagnosis Code(s):        --- Professional ---                           Z12.11, Encounter for screening for malignant                            neoplasm of colon                           K64.8, Other hemorrhoids                           K62.1, Rectal polyp                           D12.3, Benign neoplasm of transverse colon (hepatic                            flexure or splenic flexure)                           K57.30, Diverticulosis of large intestine without                            perforation or abscess without bleeding CPT copyright 2018 American Medical Association. All rights reserved. The codes documented in this report are preliminary and upon coder review may  be revised to meet current compliance requirements. Barney Drain, MD Barney Drain MD, MD 04/21/2018 1:46:42  PM This report has been signed electronically. Number of Addenda: 0

## 2018-04-21 NOTE — Anesthesia Postprocedure Evaluation (Signed)
Anesthesia Post Note  Patient: [name] [name]  Procedure(s) Performed: COLONOSCOPY WITH PROPOFOL (N/A ) POLYPECTOMY  Patient location during evaluation: PACU Anesthesia Type: General Level of consciousness: awake and patient cooperative Pain management: pain level controlled Vital Signs Assessment: post-procedure vital signs reviewed and stable Respiratory status: spontaneous breathing, nonlabored ventilation and respiratory function stable Cardiovascular status: blood pressure returned to baseline Postop Assessment: no apparent nausea or vomiting Anesthetic complications: no     Last Vitals:  Vitals:   04/21/18 1146 04/21/18 1343  BP: 124/71 (P) 135/85  Pulse: 95   Resp: 16 (P) 18  Temp: 37.1 C (P) 37.2 C  SpO2: 94% (P) 99%    Last Pain:  Vitals:   04/21/18 1302  TempSrc:   PainSc: 6                  Shadow Stiggers J

## 2018-04-21 NOTE — H&P (Signed)
Primary Care Physician:  Alycia Rossetti, MD Primary Gastroenterologist:  Dr. Oneida Alar  Pre-Procedure History & Physical: HPI:  [name] [name] is a 62 y.o. female here for [place].  Past Medical History:  Diagnosis Date  . Allergic rhinitis   . Anastomotic ulcer JAN 2009  . Anxiety   . Asthma   . BMI (body mass index) 20.0-29.9 2009 121 lbs  . Concussion   . 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 Crohn disease ([place])   . Crohn disease ([place])   . CTS (carpal tunnel syndrome)   . Diarrhea MULITIFACTORIAL   IBS, LACTOSE INTOLERANCE, SBBO, BILE-SALT  . Elevated liver enzymes 2009: BMI 24 ?Etoh ALT 94, AST 51,  NEG ANA, qIGs& ASMA   MAY 2012 AST 52 ALT 38  . Esophageal stricture 2009  . GERD (gastroesophageal reflux disease)   . Hemorrhoid   . Hyperlipemia   . Hypertension   . Inflammatory bowel disease 2006 CD   SURGICAL REMISSION  . LBP (low back pain)   . Mental disorder   . Migraine   . Osteoporosis   . PONV (postoperative nausea and vomiting)   . Shortness of breath    exertion and humidity  . Shoulder pain    right  . Sleep apnea    Stop [name]    Past Surgical History:  Procedure Laterality Date  . BACK SURGERY    . BILATERAL SALPINGOOPHORECTOMY    . BIOPSY  12/24/2011   Procedure: BIOPSY;  Surgeon: Danie Binder, MD;  Location: [facility];  Service: Endoscopy;;  Gastric Biopsies  . BIOPSY N/A 06/30/2012   Procedure: BIOPSY;  Surgeon: Danie Binder, MD;  Location: [facility];  Service: Endoscopy;  Laterality: N/A;  Gastric and Esophageal Biopsies  . CARPAL TUNNEL RELEASE  LEFT  . CATARACT EXTRACTION W/PHACO Right 07/30/2016   Procedure: CATARACT EXTRACTION PHACO AND INTRAOCULAR LENS PLACEMENT (IOC);  Surgeon: Rutherford Guys, MD;  Location: [facility];  Service: Ophthalmology;  Laterality: Right;  CDE: 5.45  . CATARACT EXTRACTION W/PHACO Left 08/13/2016   Procedure: CATARACT EXTRACTION PHACO AND INTRAOCULAR LENS PLACEMENT (IOC);  Surgeon: Rutherford Guys, MD;  Location: [facility];  Service: Ophthalmology;  Laterality: Left;  CDE: 5.59  . COLON SURGERY    . COLONOSCOPY  JAN 2009 DIARRHEA, ABD PAIN, BRBPR   ANASTOMOTIC ULCER(3MM), IH OE:UMPNTI ULCER, NL COLON bX  . DILATION AND CURETTAGE OF UTERUS    . ESOPHAGEAL DILATION  12/24/2011   SLF: A stricture was found in the distal esophagus/ Moderate gastritis/ MILD Duodenitis  . ESOPHAGOGASTRODUODENOSCOPY  02/07/09   mild gastritis  . ESOPHAGOGASTRODUODENOSCOPY (EGD) WITH PROPOFOL N/A 06/30/2012   SLF: 1. No definite stricture appreciated 2. small hiatal hernia 3. Gastritis  . ESOPHAGOGASTRODUODENOSCOPY (EGD) WITH PROPOFOL N/A 02/20/2016   Procedure: ESOPHAGOGASTRODUODENOSCOPY (EGD) WITH PROPOFOL;  Surgeon: Danie Binder, MD;  [facility];  Service: Endoscopy;  Laterality: N/A;  930  . gum flap Bilateral   . HEMICOLECTOMY  RIGHT 2006  . HERNIA REPAIR    . LUMBAR DISC SURGERY    . SAVORY DILATION N/A 06/30/2012   Procedure: SAVORY DILATION;  Surgeon: Danie Binder, MD;  Location: [facility];  Service: Endoscopy;  Laterality: N/A;  12.40m , 161m 1540m. SAVORY DILATION N/A 02/20/2016   Procedure: SAVORY DILATION;  Surgeon: SanDanie BinderD;  [facility]: [facility];  Service: Endoscopy;  Laterality: N/A;  . UPPER GASTROINTESTINAL  ENDOSCOPY  JAN 2009 ABD PAIN   GASTRITIS, ESO RING  . UPPER GASTROINTESTINAL ENDOSCOPY  OCT 2010 ABD PAIN, DYSPHAGIA   DIL 15MM, GASTRITIS, NL DUODENUM  . UPPER GASTROINTESTINAL ENDOSCOPY  OCT 2010 DYSPHAGIA   DIL 17 MM, GASTRITIS, NL DUODENUM  . vocal cord surgery  Sep 20, 2010   precancerous areas removed  . wisdom tooth extraction Right    pin placement due to broken jaw    Prior to Admission medications   Medication Sig Start Date End Date Taking? Authorizing Provider  amLODipine (NORVASC) 5 MG tablet TAKE 1 TABLET BY MOUTH ONCE DAILY Patient taking differently: Take 5 mg by mouth daily.  08/29/17  Yes [place], Modena Nunnery, MD  benzonatate  (TESSALON) 200 MG capsule Take 200 mg by mouth 3 (three) times daily as needed for cough.   Yes [provider]  cholestyramine (QUESTRAN) 4 GM/DOSE powder DISSOLVE AND TAKE 2 GRAMS BY MOUTH TWICE DAILY WITH A MEAL- DO NOT TAKE WITHIN 2 HOURS OF OTHER MEDICATIONS Patient taking differently: Take 4 g by mouth daily.  06/25/17  Yes Mahala Menghini, PA-C  cyclobenzaprine (FLEXERIL) 10 MG tablet Take 10 mg by mouth daily as needed for muscle spasms.  03/12/18  Yes [provider]  denosumab (PROLIA) 60 MG/ML SOSY injection Inject 60 mg into the skin every 6 (six) months.   Yes [provider]  dexlansoprazole (DEXILANT) 60 MG capsule 1 PO EVERY MORNING WITH BREAKFAST. Patient taking differently: Take 60 mg by mouth daily as needed (heartburn). 1 PO EVERY MORNING WITH BREAKFAST. 02/20/16  Yes Mirka Barbone L, MD  diazepam (VALIUM) 5 MG tablet TAKE 1 TABLET BY MOUTH EVERY 12 HOURS AS NEEDED FOR ANXIETY Patient taking differently: Take 5 mg by mouth every 12 (twelve) hours as needed for anxiety.  10/08/17  Yes [place], Modena Nunnery, MD  fluticasone (FLONASE) 50 MCG/ACT nasal spray USE TWO SPRAY(S) IN EACH NOSTRIL ONCE DAILY AS NEEDED FOR ALLERGY OR RHINITIS Patient taking differently: Place 2 sprays into both nostrils daily as needed for allergies or rhinitis.  11/27/15  Yes [place], Modena Nunnery, MD  gabapentin (NEURONTIN) 400 MG capsule Take 400 mg by mouth 3 (three) times daily.  01/17/16  Yes [provider]  nystatin (MYCOSTATIN) 100000 UNIT/ML suspension TAKE 5 ML BY MOUTH 4 TIMES DAILY AS NEEDED Patient taking differently: Take 5 mLs by mouth daily as needed (thrush). TAKE  5 ML BY MOUTH 4 TIMES DAILY AS NEEDED 10/22/17  Yes [place], Modena Nunnery, MD  oxyCODONE-acetaminophen (PERCOCET) 10-325 MG tablet Take 1 tablet by mouth 4 (four) times daily as needed for pain.  01/31/17  Yes [provider]  POTASSIUM PO Take 50 mg by mouth daily.    Yes [provider]   Probiotic Product ([place]) CAPS Take 1 capsule by mouth daily.   Yes [provider]  SYMBICORT 80-4.5 MCG/ACT inhaler INHALE 2 PUFFS BY MOUTH TWICE DAILY Patient taking differently: Inhale 2 puffs into the lungs daily as needed (wheezing / shortness of breath).  10/27/17  Yes [place], Modena Nunnery, MD  triamcinolone ointment (KENALOG) 0.5 % Apply 1 application topically 2 (two) times daily. Patient taking differently: Apply 1 application topically daily as needed (sores).  04/02/17  Yes Derrek Monaco A, NP  Turmeric 500 MG CAPS Take 500 mg by mouth daily as needed (inflammation).    Yes [provider]  VENTOLIN HFA 108 (90 Base) MCG/ACT inhaler INHALE 2 PUFFS BY MOUTH EVERY 4  HOURS AS NEEDED FOR WHEEZING Patient taking differently: Inhale 2 puffs into the lungs every 4 (four) hours as needed for wheezing or shortness of breath. INHALE 2 PUFFS BY MOUTH EVERY 4 HOURS AS NEEDED FOR WHEEZING 03/02/18  Yes [place], Modena Nunnery, MD  Vitamin D, Ergocalciferol, (DRISDOL) 50000 units CAPS capsule Take 1 capsule (50,000 Units total) by mouth every 7 (seven) days. 02/16/18  Yes Hamsa Laurich, Marga Melnick, MD  calcium carbonate (TUMS - DOSED IN MG ELEMENTAL CALCIUM) 500 MG chewable tablet Chew 1 tablet (200 mg of elemental calcium total) by mouth 3 (three) times daily with meals. 04/16/18   Cassandria Anger, MD  Cholecalciferol (VITAMIN D3) 125 MCG (5000 UT) CAPS Take 1 capsule (5,000 Units total) by mouth daily. 04/16/18   Cassandria Anger, MD    Allergies as of 02/12/2018 - Review Complete 02/12/2018  Allergen Reaction Noted  . Lovastatin Other (See Comments) 09/25/2010  . Naproxen Other (See Comments) 01/09/2016  . Toradol [ketorolac tromethamine] Other (See Comments) 02/20/2016  . Tramadol Other (See Comments) 02/20/2016  . Nexium [esomeprazole magnesium] Diarrhea 06/12/2012    Family History  Problem Relation Age of Onset  . Hypothyroidism Mother   . Heart disease  Father   . Parkinson's disease Father   . Hypothyroidism Daughter   . Heart attack Paternal Grandfather   . Alzheimer's disease Paternal Grandmother   . Heart attack Maternal Grandfather   . Crohn's disease Sister   . Other Sister        was murdered  . Crohn's disease Maternal Uncle   . Colon cancer Neg Hx   . Colon polyps Neg Hx     Social History   Socioeconomic History  . Marital status: Single    Spouse name: [place]  . Number of children: 2  . Years of education: GED  . Highest education level: Not on file  Occupational History    Comment: disabled  Social Needs  . Financial resource strain: Not on file  . Food insecurity:    Worry: Not on file    Inability: Not on file  . Transportation needs:    Medical: Not on file    Non-medical: Not on file  Tobacco Use  . Smoking status: Current Every Day Smoker    Packs/day: 1.00    Years: 30.00    Pack years: 30.00    Types: Cigarettes  . Smokeless tobacco: Never Used  Substance and Sexual Activity  . Alcohol use: Yes    Comment: "beer here and there"  . Drug use: No  . Sexual activity: Not Currently    Birth control/protection: Post-menopausal  Lifestyle  . Physical activity:    Days per week: Not on file    Minutes per session: Not on file  . Stress: Not on file  Relationships  . Social connections:    Talks on phone: Not on file    Gets together: Not on file    Attends religious service: Not on file    Active member of club or organization: Not on file    Attends meetings of clubs or organizations: Not on file    Relationship status: Not on file  . Intimate partner violence:    Fear of current or ex partner: Not on file    Emotionally abused: Not on file    Physically abused: Not on file    Forced sexual activity: Not on file  Other Topics Concern  . Not on file  Social History Narrative  Lives with husband   no caffiene    Review of Systems: See HPI, otherwise negative ROS   Physical Exam: BP  124/71   Pulse 95   Temp 98.8 F (37.1 C) (Oral)   Resp 16   SpO2 94%  General:   Alert,  pleasant and cooperative in NAD Head:  Normocephalic and atraumatic. Neck:  Supple; Lungs:  Clear throughout to auscultation.    Heart:  Regular rate and rhythm. Abdomen:  Soft, nontender and nondistended. Normal bowel sounds, without guarding, and without rebound.   Neurologic:  Alert and  oriented x4;  grossly normal neurologically.  Impression/Plan:    SCREENING  Plan:  1. TCS TODAY DISCUSSED PROCEDURE, BENEFITS, & RISKS: < 1% chance of medication reaction, bleeding, perforation, or rupture of spleen/liver.

## 2018-04-21 NOTE — Discharge Instructions (Signed)
Your small bowel LOOKS NORMAL. YOUR ANASTOMOSIS IS NORMAL. You have small internal hemorrhoids and diverticulosis IN YOUR LEFT COLON. YOU HAD TWO SMALL POLYPS REMOVED.    DRINK WATER TO KEEP YOUR URINE LIGHT YELLOW.  FOLLOW A HIGH FIBER DIET. AVOID ITEMS THAT CAUSE BLOATING. See info below.  YOUR BIOPSY RESULTS WILL BE BACK IN 5 BUSINESS DAYS.  USE PREPARATION H FOUR TIMES  A DAY IF NEEDED TO RELIEVE RECTAL PAIN/PRESSURE/BLEEDING.  Next colonoscopy in 5-10 years.  Colonoscopy Care After Read the instructions outlined below and refer to this sheet in the next week. These discharge instructions provide you with general information on caring for yourself after you leave the hospital. While your treatment has been planned according to the most current medical practices available, unavoidable complications occasionally occur. If you have any problems or questions after discharge, call DR. Hussein Macdougal, [phone].  ACTIVITY  You may resume your regular activity, but move at a slower pace for the next 24 hours.   Take frequent rest periods for the next 24 hours.   Walking will help get rid of the air and reduce the bloated feeling in your belly (abdomen).   No driving for 24 hours (because of the medicine (anesthesia) used during the test).   You may shower.   Do not sign any important legal documents or operate any machinery for 24 hours (because of the anesthesia used during the test).    NUTRITION  Drink plenty of fluids.   You may resume your normal diet as instructed by your doctor.   Begin with a light meal and progress to your normal diet. Heavy or fried foods are harder to digest and may make you feel sick to your stomach (nauseated).   Avoid alcoholic beverages for 24 hours or as instructed.    MEDICATIONS  You may resume your normal medications.   WHAT YOU CAN EXPECT TODAY  Some feelings of bloating in the abdomen.   Passage of more gas than usual.   Spotting of  blood in your stool or on the toilet paper  .  IF YOU HAD POLYPS REMOVED DURING THE COLONOSCOPY:  Eat a soft diet IF YOU HAVE NAUSEA, BLOATING, ABDOMINAL PAIN, OR VOMITING.    FINDING OUT THE RESULTS OF YOUR TEST Not all test results are available during your visit. DR. Oneida Alar WILL CALL YOU WITHIN 14 DAYS OF YOUR PROCEDUE WITH YOUR RESULTS. Do not assume everything is normal if you have not heard from DR. Avaiyah Strubel, CALL HER OFFICE AT [phone].  SEEK IMMEDIATE MEDICAL ATTENTION AND CALL THE OFFICE: [phone] IF:  You have more than a spotting of blood in your stool.   Your belly is swollen (abdominal distention).   You are nauseated or vomiting.   You have a temperature over 101F.   You have abdominal pain or discomfort that is severe or gets worse throughout the day.  High-Fiber Diet A high-fiber diet changes your normal diet to include more whole grains, legumes, fruits, and vegetables. Changes in the diet involve replacing refined carbohydrates with unrefined foods. The calorie level of the diet is essentially unchanged. The Dietary Reference Intake (recommended amount) for adult males is 38 grams per day. For adult females, it is 25 grams per day. Pregnant and lactating women should consume 28 grams of fiber per day. Fiber is the intact part of a plant that is not broken down during digestion. Functional fiber is fiber that has been isolated from the plant to provide a beneficial  effect in the body.  PURPOSE  Increase stool bulk.   Ease and regulate bowel movements.   Lower cholesterol.   REDUCE RISK OF COLON CANCER  INDICATIONS THAT YOU NEED MORE FIBER  Constipation and hemorrhoids.   Uncomplicated diverticulosis (intestine condition) and irritable bowel syndrome.   Weight management.   As a protective measure against hardening of the arteries (atherosclerosis), diabetes, and cancer.   GUIDELINES FOR INCREASING FIBER IN THE DIET  Start adding fiber to the diet  slowly. A gradual increase of about 5 more grams (2 slices of whole-wheat bread, 2 servings of most fruits or vegetables, or 1 bowl of high-fiber cereal) per day is best. Too rapid an increase in fiber may result in constipation, flatulence, and bloating.   Drink enough water and fluids to keep your urine clear or pale yellow. Water, juice, or caffeine-free drinks are recommended. Not drinking enough fluid may cause constipation.   Eat a variety of high-fiber foods rather than one type of fiber.   Try to increase your intake of fiber through using high-fiber foods rather than fiber pills or supplements that contain small amounts of fiber.   The goal is to change the types of food eaten. Do not supplement your present diet with high-fiber foods, but replace foods in your present diet.   INCLUDE A VARIETY OF FIBER SOURCES  Replace refined and processed grains with whole grains, canned fruits with fresh fruits, and incorporate other fiber sources. White rice, white breads, and most bakery goods contain little or no fiber.   Brown whole-grain rice, buckwheat oats, and many fruits and vegetables are all good sources of fiber. These include: broccoli, Brussels sprouts, cabbage, cauliflower, beets, sweet potatoes, white potatoes (skin on), carrots, tomatoes, eggplant, squash, berries, fresh fruits, and dried fruits.   Cereals appear to be the richest source of fiber. Cereal fiber is found in whole grains and bran. Bran is the fiber-rich outer coat of cereal grain, which is largely removed in refining. In whole-grain cereals, the bran remains. In breakfast cereals, the largest amount of fiber is found in those with "bran" in their names. The fiber content is sometimes indicated on the label.   You may need to include additional fruits and vegetables each day.   In baking, for 1 cup white flour, you may use the following substitutions:   1 cup whole-wheat flour minus 2 tablespoons.   1/2 cup white  flour plus 1/2 cup whole-wheat flour.   Polyps, Colon  A polyp is extra tissue that grows inside your body. Colon polyps grow in the large intestine. The large intestine, also called the colon, is part of your digestive system. It is a long, hollow tube at the end of your digestive tract where your body makes and stores stool. Most polyps are not dangerous. They are benign. This means they are not cancerous. But over time, some types of polyps can turn into cancer. Polyps that are smaller than a pea are usually not harmful. But larger polyps could someday become or may already be cancerous. To be safe, doctors remove all polyps and test them.   PREVENTION There is not one sure way to prevent polyps. You might be able to lower your risk of getting them if you:  Eat more fruits and vegetables and less fatty food.   Do not smoke.   Avoid alcohol.   Exercise every day.   Lose weight if you are overweight.   Eating more calcium and folate can  also lower your risk of getting polyps. Some foods that are rich in calcium are milk, cheese, and broccoli. Some foods that are rich in folate are chickpeas, kidney beans, and spinach.    Diverticulosis Diverticulosis is a common condition that develops when small pouches (diverticula) form in the wall of the colon. The risk of diverticulosis increases with age. It happens more often in people who eat a low-fiber diet. Most individuals with diverticulosis have no symptoms. Those individuals with symptoms usually experience belly (abdominal) pain, constipation, or loose stools (diarrhea).  HOME CARE INSTRUCTIONS  Increase the amount of fiber in your diet as directed by your caregiver or dietician. This may reduce symptoms of diverticulosis.   Drink at least 6 to 8 glasses of water each day to prevent constipation.   Try not to strain when you have a bowel movement.   Avoiding nuts and seeds to prevent complications is NOT NECESSARY.   FOODS HAVING  HIGH FIBER CONTENT INCLUDE:  Fruits. Apple, peach, pear, tangerine, raisins, prunes.   Vegetables. Brussels sprouts, asparagus, broccoli, cabbage, carrot, cauliflower, romaine lettuce, spinach, summer squash, tomato, winter squash, zucchini.   Starchy Vegetables. Baked beans, kidney beans, lima beans, split peas, lentils, potatoes (with skin).   Grains. Whole wheat bread, brown rice, bran flake cereal, plain oatmeal, white rice, shredded wheat, bran muffins.   SEEK IMMEDIATE MEDICAL CARE IF:  You develop increasing pain or severe bloating.   You have an oral temperature above 101F.   You develop vomiting or bowel movements that are bloody or black.

## 2018-04-23 ENCOUNTER — Ambulatory Visit (HOSPITAL_COMMUNITY)
Admission: RE | Admit: 2018-04-23 | Discharge: 2018-04-23 | Disposition: A | Discharge status: Home / Self Care | Payer: Medicare Other | Source: Ambulatory Visit | Attending: Student | Admitting: Student

## 2018-04-23 DIAGNOSIS — M5136 Other intervertebral disc degeneration, lumbar region: Secondary | ICD-10-CM | POA: Insufficient documentation

## 2018-04-23 DIAGNOSIS — M545 Low back pain, unspecified: Secondary | ICD-10-CM

## 2018-04-23 DIAGNOSIS — M48061 Spinal stenosis, lumbar region without neurogenic claudication: Secondary | ICD-10-CM | POA: Diagnosis not present

## 2018-04-23 DIAGNOSIS — M5442 Lumbago with sciatica, left side: Secondary | ICD-10-CM | POA: Diagnosis not present

## 2018-04-23 DIAGNOSIS — M4316 Spondylolisthesis, lumbar region: Secondary | ICD-10-CM | POA: Insufficient documentation

## 2018-04-24 ENCOUNTER — Ambulatory Visit (HOSPITAL_COMMUNITY): Admission: RE | Admit: 2018-04-24 | Payer: Medicare Other | Source: Ambulatory Visit

## 2018-04-27 ENCOUNTER — Encounter (HOSPITAL_COMMUNITY): Payer: Self-pay | Admitting: Gastroenterology

## 2018-04-30 ENCOUNTER — Telehealth: Payer: Self-pay | Admitting: Gastroenterology

## 2018-04-30 NOTE — Telephone Encounter (Signed)
Please call pt. She had TWO simple adenomas removed.   DRINK WATER TO KEEP YOUR URINE LIGHT YELLOW.  FOLLOW A HIGH FIBER DIET. AVOID ITEMS THAT CAUSE BLOATING.  USE PREPARATION H FOUR TIMES  A DAY IF NEEDED TO RELIEVE RECTAL PAIN/PRESSURE/BLEEDING.  Next colonoscopy in 5-10 years.

## 2018-05-01 NOTE — Telephone Encounter (Signed)
Pt notified of SF recommendations.

## 2018-05-01 NOTE — Telephone Encounter (Signed)
PLEASE CALL PT. IF CALCIUM IS CAUSING ABDOMINAL PAIN SHE SHOULD STOP TAKING THEM. IF SHE TOLERATES TUMS SHE CAN TAKE ONE THREE TIMES A DAY AFTER MEALS.

## 2018-05-01 NOTE — Telephone Encounter (Signed)
Pt notified of results. Pt is taking Calcium with her meals and is having a great deal of abdominal pain after taking pills. She takes tums and it helps some. Pt is taking the [facility] and isn't sure if it could be the brand causing the pain. Pt tired taking the calicum qod to avoid abdominal pain daily. On the days calcium isn't taken, she doesn't have the pain.

## 2018-05-04 NOTE — Telephone Encounter (Signed)
ON RECALL  °

## 2018-05-19 DIAGNOSIS — M542 Cervicalgia: Secondary | ICD-10-CM | POA: Diagnosis not present

## 2018-05-19 DIAGNOSIS — R1084 Generalized abdominal pain: Secondary | ICD-10-CM | POA: Diagnosis not present

## 2018-05-19 DIAGNOSIS — G894 Chronic pain syndrome: Secondary | ICD-10-CM | POA: Diagnosis not present

## 2018-05-19 DIAGNOSIS — Z79891 Long term (current) use of opiate analgesic: Secondary | ICD-10-CM | POA: Diagnosis not present

## 2018-05-19 DIAGNOSIS — Z79899 Other long term (current) drug therapy: Secondary | ICD-10-CM | POA: Diagnosis not present

## 2018-06-02 DIAGNOSIS — R030 Elevated blood-pressure reading, without diagnosis of hypertension: Secondary | ICD-10-CM | POA: Diagnosis not present

## 2018-06-02 DIAGNOSIS — M4316 Spondylolisthesis, lumbar region: Secondary | ICD-10-CM | POA: Diagnosis not present

## 2018-06-02 DIAGNOSIS — M5442 Lumbago with sciatica, left side: Secondary | ICD-10-CM | POA: Diagnosis not present

## 2018-06-02 DIAGNOSIS — M47816 Spondylosis without myelopathy or radiculopathy, lumbar region: Secondary | ICD-10-CM | POA: Diagnosis not present

## 2018-06-06 ENCOUNTER — Other Ambulatory Visit: Payer: Self-pay | Admitting: Family Medicine

## 2018-06-16 ENCOUNTER — Other Ambulatory Visit: Payer: Self-pay | Admitting: Neurosurgery

## 2018-06-18 DIAGNOSIS — M545 Low back pain: Secondary | ICD-10-CM | POA: Diagnosis not present

## 2018-06-18 DIAGNOSIS — M25552 Pain in left hip: Secondary | ICD-10-CM | POA: Diagnosis not present

## 2018-06-18 DIAGNOSIS — G894 Chronic pain syndrome: Secondary | ICD-10-CM | POA: Diagnosis not present

## 2018-06-18 DIAGNOSIS — Z79891 Long term (current) use of opiate analgesic: Secondary | ICD-10-CM | POA: Diagnosis not present

## 2018-06-18 DIAGNOSIS — R1084 Generalized abdominal pain: Secondary | ICD-10-CM | POA: Diagnosis not present

## 2018-06-18 DIAGNOSIS — M542 Cervicalgia: Secondary | ICD-10-CM | POA: Diagnosis not present

## 2018-06-26 ENCOUNTER — Encounter: Payer: Medicare Other | Admitting: Family Medicine

## 2018-07-08 ENCOUNTER — Encounter: Payer: Self-pay | Admitting: Family Medicine

## 2018-07-08 NOTE — Pre-Procedure Instructions (Signed)
[name] [name]  07/08/2018      [facility] - [place], [place] - [address] [facility] [address] [place] [address] Phone: [phone] Fax: [phone]    Your procedure is scheduled on Thursday, March 12th.  Report to [facility] Admitting Entrance "A" at 5:30 A.M.  Call this number if you have problems the morning of surgery:  [phone]   Remember:  Do not eat or drink after midnight.    Take these medicines the morning of surgery with A SIP OF WATER  amLODipine (NORVASC)  dexlansoprazole (DEXILANT) gabapentin (NEURONTIN)  As needed: Inhalers, oxyCODONE-acetaminophen (PERCOCET) , fluticasone (FLONASE), cyclobenzaprine (FLEXERIL).  As of today, STOP taking any Aspirin (unless otherwise instructed by your surgeon), Aleve, Naproxen, Ibuprofen, Motrin, Advil, Goody's, BC's, all herbal medications, fish oil, and all vitamins.      Do not wear jewelry, make-up or nail polish.  Do not wear lotions, powders, or perfumes, or deodorant.  Do not shave 48 hours prior to surgery.    Do not bring valuables to the hospital.  [name] is not responsible for any belongings or valuables.  Contacts, dentures or bridgework may not be worn into surgery.  Leave your suitcase in the car.  After surgery it may be brought to your room.  For patients admitted to the hospital, discharge time will be determined by your treatment team.  Patients discharged the day of surgery will not be allowed to drive home.   Special instructions:   [facility]- Preparing For Surgery  Before surgery, you can play an important role. Because skin is not sterile, your skin needs to be as free of germs as possible. You can reduce the number of germs on your skin by washing with CHG (chlorahexidine gluconate) Soap before surgery.  CHG is an antiseptic cleaner which kills germs and bonds with the skin to continue killing germs even after washing.    Oral Hygiene is also important  to reduce your risk of infection.  Remember - BRUSH YOUR TEETH THE MORNING OF SURGERY WITH YOUR REGULAR TOOTHPASTE  Please do not use if you have an allergy to CHG or antibacterial soaps. If your skin becomes reddened/irritated stop using the CHG.  Do not shave (including legs and underarms) for at least 48 hours prior to first CHG shower. It is OK to shave your face.  Please follow these instructions carefully.   1. Shower the NIGHT BEFORE SURGERY and the MORNING OF SURGERY with CHG.   2. If you chose to wash your hair, wash your hair first as usual with your normal shampoo.  3. After you shampoo, rinse your hair and body thoroughly to remove the shampoo.  4. Use CHG as you would any other liquid soap. You can apply CHG directly to the skin and wash gently with a scrungie or a clean washcloth.   5. Apply the CHG Soap to your body ONLY FROM THE NECK DOWN.  Do not use on open wounds or open sores. Avoid contact with your eyes, ears, mouth and genitals (private parts). Wash Face and genitals (private parts)  with your normal soap.  6. Wash thoroughly, paying special attention to the area where your surgery will be performed.  7. Thoroughly rinse your body with warm water from the neck down.  8. DO NOT shower/wash with your normal soap after using and rinsing off the CHG Soap.  9. Pat yourself dry with a CLEAN TOWEL.  10.  Wear CLEAN PAJAMAS to bed the night before surgery, wear comfortable clothes the morning of surgery  11. Place CLEAN SHEETS on your bed the night of your first shower and DO NOT SLEEP WITH PETS.    Day of Surgery:  Do not apply any deodorants/lotions.  Please wear clean clothes to the hospital/surgery center.   Remember to brush your teeth WITH YOUR REGULAR TOOTHPASTE.   Please read over the following fact sheets that you were given.

## 2018-07-09 ENCOUNTER — Encounter (HOSPITAL_COMMUNITY)
Admission: RE | Admit: 2018-07-09 | Discharge: 2018-07-09 | Disposition: A | Discharge status: Home / Self Care | Payer: Medicare Other | Source: Ambulatory Visit | Attending: Neurosurgery | Admitting: Neurosurgery

## 2018-07-09 ENCOUNTER — Encounter (HOSPITAL_COMMUNITY): Payer: Self-pay

## 2018-07-09 ENCOUNTER — Other Ambulatory Visit: Payer: Self-pay

## 2018-07-09 DIAGNOSIS — Z01812 Encounter for preprocedural laboratory examination: Secondary | ICD-10-CM | POA: Diagnosis not present

## 2018-07-09 HISTORY — DX: Unspecified osteoarthritis, unspecified site: M19.90

## 2018-07-09 LAB — BASIC METABOLIC PANEL
Anion gap: 10 (ref 5–15)
BUN: 6 mg/dL (ref 6–20)
CO2: 25 mmol/L (ref 22–32)
Calcium: 9 mg/dL (ref 8.9–10.3)
Chloride: 103 mmol/L (ref 98–111)
Creatinine, Ser: 0.55 mg/dL (ref 0.44–1.00)
GFR calc Af Amer: >60 mL/min (ref >60)
GFR calc non Af Amer: >60 mL/min (ref >60)
Glucose, Bld: 65 mg/dL — ABNORMAL LOW (ref 70–99)
Potassium: 3.6 mmol/L (ref 3.5–5.1)
Sodium: 138 mmol/L (ref 135–145)

## 2018-07-09 LAB — CBC
HCT: 43 % (ref 36.0–46.0)
Hemoglobin: 14.9 g/dL (ref 12.0–15.0)
MCH: 36.1 pg — ABNORMAL HIGH (ref 26.0–34.0)
MCHC: 34.7 g/dL (ref 30.0–36.0)
MCV: 104.1 fL — ABNORMAL HIGH (ref 80.0–100.0)
Platelets: 212 10*3/uL (ref 150–400)
RBC: 4.13 MIL/uL (ref 3.87–5.11)
RDW: 12.6 % (ref 11.5–15.5)
WBC: 2.8 10*3/uL — ABNORMAL LOW (ref 4.0–10.5)
nRBC: 0 % (ref 0.0–0.2)

## 2018-07-09 LAB — TYPE AND SCREEN
ABO/RH(D): O POS
Antibody Screen: NEGATIVE

## 2018-07-09 LAB — GLUCOSE, CAPILLARY: Glucose-Capillary: 111 mg/dL — ABNORMAL HIGH (ref 70–99)

## 2018-07-09 LAB — ABO/RH: ABO/RH(D): O POS

## 2018-07-09 LAB — SURGICAL PCR SCREEN
MRSA, PCR: NEGATIVE
STAPHYLOCOCCUS AUREUS: NEGATIVE

## 2018-07-09 MED ORDER — CHLORHEXIDINE GLUCONATE CLOTH 2 % EX PADS: 6.0000 | MEDICATED_PAD | Freq: Once | CUTANEOUS | Status: DC

## 2018-07-09 NOTE — Progress Notes (Signed)
PCP - Dr Rolinda [place] Cardiologist -   Chest x-ray - 10/19 EKG - 12/19 Stress Test - 7/11 ECHO - 7/11 Cardiac Cath - na  Sleep Study - yes CPAP - none  Fasting Blood Sugar -    Blood Thinner Instructions: Aspirin Instructions:stop  Anesthesia review: review ekg  Patient denies shortness of breath, fever, cough and chest pain at PAT appointment   Patient verbalized understanding of instructions that were given to them at the PAT appointment. Patient was also instructed that they will need to review over the PAT instructions again at home before surgery.

## 2018-07-10 NOTE — Anesthesia Preprocedure Evaluation (Addendum)
Anesthesia Evaluation  Patient identified by MRN, date of birth, ID band Patient awake    Reviewed: Allergy & Precautions, H&P , NPO status , Patient's Chart, lab work & pertinent test results  History of Anesthesia Complications (+) PONV and history of anesthetic complications  Airway Mallampati: II   Neck ROM: full    Dental   Pulmonary shortness of breath, asthma , sleep apnea , COPD, Current Smoker,    breath sounds clear to auscultation       Cardiovascular hypertension,  Rhythm:regular Rate:Normal     Neuro/Psych  Headaches, PSYCHIATRIC DISORDERS Anxiety Depression    GI/Hepatic GERD  ,  Endo/Other    Renal/GU      Musculoskeletal  (+) Arthritis ,   Abdominal   Peds  Hematology   Anesthesia Other Findings   Reproductive/Obstetrics                            Anesthesia Physical Anesthesia Plan  ASA: III  Anesthesia Plan: General   Post-op Pain Management:    Induction: Intravenous  PONV Risk Score and Plan: 3 and Ondansetron, Dexamethasone, Midazolam and Treatment may vary due to age or medical condition  Airway Management Planned: Oral ETT  Additional Equipment:   Intra-op Plan:   Post-operative Plan: Extubation in OR  Informed Consent: I have reviewed the patients History and Physical, chart, labs and discussed the procedure including the risks, benefits and alternatives for the proposed anesthesia with the patient or authorized representative who has indicated his/her understanding and acceptance.       Plan Discussed with: CRNA, Anesthesiologist and Surgeon  Anesthesia Plan Comments: (Notes reviewed from PCP indicate pt has hx of ETOH abuse. Dr. Arnoldo Morale made aware. )      Anesthesia Quick Evaluation

## 2018-07-16 ENCOUNTER — Encounter (HOSPITAL_COMMUNITY)
Admission: RE | Disposition: A | Discharge status: Home / Self Care | Payer: Self-pay | Source: Home / Self Care | Attending: Neurosurgery

## 2018-07-16 ENCOUNTER — Encounter (HOSPITAL_COMMUNITY): Payer: Self-pay

## 2018-07-16 ENCOUNTER — Inpatient Hospital Stay (HOSPITAL_COMMUNITY): Payer: Medicare Other | Admitting: Physician Assistant

## 2018-07-16 ENCOUNTER — Inpatient Hospital Stay (HOSPITAL_COMMUNITY)
Admission: RE | Admit: 2018-07-16 | Discharge: 2018-07-18 | DRG: 455 | Disposition: A | Discharge status: Home / Self Care | Payer: Medicare Other | Attending: Neurosurgery | Admitting: Neurosurgery

## 2018-07-16 ENCOUNTER — Inpatient Hospital Stay (HOSPITAL_COMMUNITY): Payer: Medicare Other

## 2018-07-16 ENCOUNTER — Other Ambulatory Visit: Payer: Self-pay

## 2018-07-16 ENCOUNTER — Inpatient Hospital Stay (HOSPITAL_COMMUNITY): Payer: Medicare Other | Admitting: Certified Registered"

## 2018-07-16 DIAGNOSIS — M5116 Intervertebral disc disorders with radiculopathy, lumbar region: Secondary | ICD-10-CM | POA: Diagnosis present

## 2018-07-16 DIAGNOSIS — Z7951 Long term (current) use of inhaled steroids: Secondary | ICD-10-CM | POA: Diagnosis not present

## 2018-07-16 DIAGNOSIS — Z9842 Cataract extraction status, left eye: Secondary | ICD-10-CM

## 2018-07-16 DIAGNOSIS — M48062 Spinal stenosis, lumbar region with neurogenic claudication: Secondary | ICD-10-CM | POA: Diagnosis present

## 2018-07-16 DIAGNOSIS — M4316 Spondylolisthesis, lumbar region: Principal | ICD-10-CM | POA: Diagnosis present

## 2018-07-16 DIAGNOSIS — F17210 Nicotine dependence, cigarettes, uncomplicated: Secondary | ICD-10-CM | POA: Diagnosis present

## 2018-07-16 DIAGNOSIS — Z9841 Cataract extraction status, right eye: Secondary | ICD-10-CM | POA: Diagnosis not present

## 2018-07-16 DIAGNOSIS — Z91012 Allergy to eggs: Secondary | ICD-10-CM

## 2018-07-16 DIAGNOSIS — Z8249 Family history of ischemic heart disease and other diseases of the circulatory system: Secondary | ICD-10-CM

## 2018-07-16 DIAGNOSIS — Z888 Allergy status to other drugs, medicaments and biological substances status: Secondary | ICD-10-CM

## 2018-07-16 DIAGNOSIS — K219 Gastro-esophageal reflux disease without esophagitis: Secondary | ICD-10-CM | POA: Diagnosis present

## 2018-07-16 DIAGNOSIS — Z9049 Acquired absence of other specified parts of digestive tract: Secondary | ICD-10-CM

## 2018-07-16 DIAGNOSIS — G4730 Sleep apnea, unspecified: Secondary | ICD-10-CM | POA: Diagnosis present

## 2018-07-16 DIAGNOSIS — M4326 Fusion of spine, lumbar region: Secondary | ICD-10-CM | POA: Diagnosis not present

## 2018-07-16 DIAGNOSIS — Z9079 Acquired absence of other genital organ(s): Secondary | ICD-10-CM | POA: Diagnosis not present

## 2018-07-16 DIAGNOSIS — K08409 Partial loss of teeth, unspecified cause, unspecified class: Secondary | ICD-10-CM | POA: Diagnosis present

## 2018-07-16 DIAGNOSIS — M810 Age-related osteoporosis without current pathological fracture: Secondary | ICD-10-CM | POA: Diagnosis present

## 2018-07-16 DIAGNOSIS — M5136 Other intervertebral disc degeneration, lumbar region: Secondary | ICD-10-CM | POA: Diagnosis not present

## 2018-07-16 DIAGNOSIS — Z961 Presence of intraocular lens: Secondary | ICD-10-CM | POA: Diagnosis present

## 2018-07-16 DIAGNOSIS — Z419 Encounter for procedure for purposes other than remedying health state, unspecified: Secondary | ICD-10-CM

## 2018-07-16 DIAGNOSIS — Z8711 Personal history of peptic ulcer disease: Secondary | ICD-10-CM

## 2018-07-16 DIAGNOSIS — J449 Chronic obstructive pulmonary disease, unspecified: Secondary | ICD-10-CM | POA: Diagnosis present

## 2018-07-16 DIAGNOSIS — I10 Essential (primary) hypertension: Secondary | ICD-10-CM | POA: Diagnosis present

## 2018-07-16 DIAGNOSIS — E785 Hyperlipidemia, unspecified: Secondary | ICD-10-CM | POA: Diagnosis present

## 2018-07-16 DIAGNOSIS — Z79899 Other long term (current) drug therapy: Secondary | ICD-10-CM

## 2018-07-16 DIAGNOSIS — Z981 Arthrodesis status: Secondary | ICD-10-CM | POA: Diagnosis not present

## 2018-07-16 HISTORY — PX: BACK SURGERY: SHX140

## 2018-07-16 SURGERY — POSTERIOR LUMBAR FUSION 1 LEVEL
Anesthesia: General

## 2018-07-16 MED ORDER — MIDAZOLAM HCL 5 MG/5ML IJ SOLN
INTRAMUSCULAR | Status: DC | PRN
  Administered 2018-07-16: 2 mg via INTRAVENOUS

## 2018-07-16 MED ORDER — POTASSIUM 75 MG PO TABS: 50.0000 mg | ORAL_TABLET | Freq: Every day | ORAL | Status: DC

## 2018-07-16 MED ORDER — AMLODIPINE BESYLATE 5 MG PO TABS
5.0000 mg | ORAL_TABLET | Freq: Every day | ORAL | Status: DC
  Administered 2018-07-16: 5 mg via ORAL
  Filled 2018-07-16 (×2): qty 1

## 2018-07-16 MED ORDER — 0.9 % SODIUM CHLORIDE (POUR BTL) OPTIME
TOPICAL | Status: DC | PRN
  Administered 2018-07-16: 1000 mL

## 2018-07-16 MED ORDER — FENTANYL CITRATE (PF) 100 MCG/2ML IJ SOLN
INTRAMUSCULAR | Status: AC
  Filled 2018-07-16: qty 2

## 2018-07-16 MED ORDER — OXYCODONE HCL 5 MG PO TABS: 5.0000 mg | ORAL_TABLET | ORAL | Status: DC | PRN

## 2018-07-16 MED ORDER — SUGAMMADEX SODIUM 200 MG/2ML IV SOLN
INTRAVENOUS | Status: DC | PRN
  Administered 2018-07-16: 100 mg via INTRAVENOUS

## 2018-07-16 MED ORDER — ACETAMINOPHEN 325 MG PO TABS
650.0000 mg | ORAL_TABLET | ORAL | Status: DC | PRN
  Administered 2018-07-18: 650 mg via ORAL
  Filled 2018-07-16: qty 2

## 2018-07-16 MED ORDER — ONDANSETRON HCL 4 MG/2ML IJ SOLN: 4.0000 mg | Freq: Four times a day (QID) | INTRAMUSCULAR | Status: DC | PRN

## 2018-07-16 MED ORDER — OXYCODONE HCL 5 MG/5ML PO SOLN: 5.0000 mg | Freq: Once | ORAL | Status: AC | PRN

## 2018-07-16 MED ORDER — OXYCODONE HCL 5 MG PO TABS
5.0000 mg | ORAL_TABLET | Freq: Once | ORAL | Status: AC | PRN
  Administered 2018-07-16: 5 mg via ORAL

## 2018-07-16 MED ORDER — FENTANYL CITRATE (PF) 100 MCG/2ML IJ SOLN
25.0000 ug | INTRAMUSCULAR | Status: DC | PRN
  Administered 2018-07-16 (×4): 25 ug via INTRAVENOUS

## 2018-07-16 MED ORDER — PANTOPRAZOLE SODIUM 40 MG PO TBEC
40.0000 mg | DELAYED_RELEASE_TABLET | Freq: Every day | ORAL | Status: DC
  Administered 2018-07-16 – 2018-07-17 (×2): 40 mg via ORAL
  Filled 2018-07-16 (×2): qty 1

## 2018-07-16 MED ORDER — ZOLPIDEM TARTRATE 5 MG PO TABS
5.0000 mg | ORAL_TABLET | Freq: Every evening | ORAL | Status: DC | PRN
  Administered 2018-07-17: 5 mg via ORAL
  Filled 2018-07-16: qty 1

## 2018-07-16 MED ORDER — PHENOL 1.4 % MT LIQD: 1.0000 | OROMUCOSAL | Status: DC | PRN

## 2018-07-16 MED ORDER — DEXAMETHASONE SODIUM PHOSPHATE 10 MG/ML IJ SOLN
INTRAMUSCULAR | Status: AC
  Filled 2018-07-16: qty 1

## 2018-07-16 MED ORDER — BACITRACIN ZINC 500 UNIT/GM EX OINT
TOPICAL_OINTMENT | CUTANEOUS | Status: AC
  Filled 2018-07-16: qty 28.35

## 2018-07-16 MED ORDER — OXYCODONE HCL 5 MG PO TABS
10.0000 mg | ORAL_TABLET | ORAL | Status: DC | PRN
  Administered 2018-07-16 – 2018-07-18 (×13): 10 mg via ORAL
  Filled 2018-07-16 (×13): qty 2

## 2018-07-16 MED ORDER — PHENYLEPHRINE 40 MCG/ML (10ML) SYRINGE FOR IV PUSH (FOR BLOOD PRESSURE SUPPORT)
PREFILLED_SYRINGE | INTRAVENOUS | Status: DC | PRN
  Administered 2018-07-16: 80 ug via INTRAVENOUS
  Administered 2018-07-16: 120 ug via INTRAVENOUS

## 2018-07-16 MED ORDER — CYCLOBENZAPRINE HCL 10 MG PO TABS
10.0000 mg | ORAL_TABLET | Freq: Every day | ORAL | Status: DC | PRN
  Filled 2018-07-16: qty 1

## 2018-07-16 MED ORDER — FENTANYL CITRATE (PF) 250 MCG/5ML IJ SOLN
INTRAMUSCULAR | Status: AC
  Filled 2018-07-16: qty 5

## 2018-07-16 MED ORDER — EPHEDRINE 5 MG/ML INJ
INTRAVENOUS | Status: AC
  Filled 2018-07-16: qty 10

## 2018-07-16 MED ORDER — VANCOMYCIN HCL 1 G IV SOLR
INTRAVENOUS | Status: DC | PRN
  Administered 2018-07-16: 1000 mg via TOPICAL

## 2018-07-16 MED ORDER — ONDANSETRON HCL 4 MG PO TABS: 4.0000 mg | ORAL_TABLET | Freq: Four times a day (QID) | ORAL | Status: DC | PRN

## 2018-07-16 MED ORDER — KETAMINE HCL 10 MG/ML IJ SOLN
INTRAMUSCULAR | Status: DC | PRN
  Administered 2018-07-16 (×3): 10 mg via INTRAVENOUS

## 2018-07-16 MED ORDER — ACETAMINOPHEN 500 MG PO TABS
1000.0000 mg | ORAL_TABLET | Freq: Four times a day (QID) | ORAL | Status: AC
  Administered 2018-07-16 – 2018-07-17 (×4): 1000 mg via ORAL
  Filled 2018-07-16 (×4): qty 2

## 2018-07-16 MED ORDER — MENTHOL 3 MG MT LOZG: 1.0000 | LOZENGE | OROMUCOSAL | Status: DC | PRN

## 2018-07-16 MED ORDER — MOMETASONE FURO-FORMOTEROL FUM 100-5 MCG/ACT IN AERO
2.0000 | INHALATION_SPRAY | Freq: Two times a day (BID) | RESPIRATORY_TRACT | Status: DC
  Administered 2018-07-16 – 2018-07-17 (×4): 2 via RESPIRATORY_TRACT
  Filled 2018-07-16: qty 8.8

## 2018-07-16 MED ORDER — ONDANSETRON HCL 4 MG/2ML IJ SOLN
INTRAMUSCULAR | Status: AC
  Filled 2018-07-16: qty 2

## 2018-07-16 MED ORDER — CEFAZOLIN SODIUM-DEXTROSE 2-4 GM/100ML-% IV SOLN
INTRAVENOUS | Status: AC
  Filled 2018-07-16: qty 100

## 2018-07-16 MED ORDER — OXYCODONE HCL 5 MG PO TABS
ORAL_TABLET | ORAL | Status: AC
  Filled 2018-07-16: qty 1

## 2018-07-16 MED ORDER — ROCURONIUM BROMIDE 50 MG/5ML IV SOSY
PREFILLED_SYRINGE | INTRAVENOUS | Status: AC
  Filled 2018-07-16: qty 5

## 2018-07-16 MED ORDER — SODIUM CHLORIDE 0.9% FLUSH: 3.0000 mL | Freq: Two times a day (BID) | INTRAVENOUS | Status: DC

## 2018-07-16 MED ORDER — LIDOCAINE-EPINEPHRINE 1 %-1:100000 IJ SOLN
INTRAMUSCULAR | Status: AC
  Filled 2018-07-16: qty 1

## 2018-07-16 MED ORDER — PROPOFOL 10 MG/ML IV BOLUS
INTRAVENOUS | Status: AC
  Filled 2018-07-16: qty 20

## 2018-07-16 MED ORDER — EPHEDRINE SULFATE-NACL 50-0.9 MG/10ML-% IV SOSY
PREFILLED_SYRINGE | INTRAVENOUS | Status: DC | PRN
  Administered 2018-07-16: 5 mg via INTRAVENOUS

## 2018-07-16 MED ORDER — LIDOCAINE-EPINEPHRINE 1 %-1:100000 IJ SOLN
INTRAMUSCULAR | Status: DC | PRN
  Administered 2018-07-16: 10 mL

## 2018-07-16 MED ORDER — MIDAZOLAM HCL 2 MG/2ML IJ SOLN
INTRAMUSCULAR | Status: AC
  Filled 2018-07-16: qty 2

## 2018-07-16 MED ORDER — BACITRACIN ZINC 500 UNIT/GM EX OINT
TOPICAL_OINTMENT | CUTANEOUS | Status: DC | PRN
  Administered 2018-07-16: 1 via TOPICAL

## 2018-07-16 MED ORDER — SODIUM CHLORIDE 0.9% FLUSH: 3.0000 mL | INTRAVENOUS | Status: DC | PRN

## 2018-07-16 MED ORDER — FENTANYL CITRATE (PF) 100 MCG/2ML IJ SOLN
INTRAMUSCULAR | Status: DC | PRN
  Administered 2018-07-16: 50 ug via INTRAVENOUS
  Administered 2018-07-16: 25 ug via INTRAVENOUS
  Administered 2018-07-16: 100 ug via INTRAVENOUS
  Administered 2018-07-16 (×3): 25 ug via INTRAVENOUS

## 2018-07-16 MED ORDER — ALBUTEROL SULFATE (2.5 MG/3ML) 0.083% IN NEBU: 3.0000 mL | INHALATION_SOLUTION | RESPIRATORY_TRACT | Status: DC | PRN

## 2018-07-16 MED ORDER — ROCURONIUM BROMIDE 100 MG/10ML IV SOLN
INTRAVENOUS | Status: DC | PRN
  Administered 2018-07-16: 20 mg via INTRAVENOUS
  Administered 2018-07-16: 5 mg via INTRAVENOUS
  Administered 2018-07-16: 50 mg via INTRAVENOUS

## 2018-07-16 MED ORDER — CEFAZOLIN SODIUM-DEXTROSE 2-4 GM/100ML-% IV SOLN
2.0000 g | INTRAVENOUS | Status: AC
  Administered 2018-07-16: 2 g via INTRAVENOUS

## 2018-07-16 MED ORDER — THROMBIN 5000 UNITS EX SOLR
CUTANEOUS | Status: AC
  Filled 2018-07-16: qty 5000

## 2018-07-16 MED ORDER — VANCOMYCIN HCL 1000 MG IV SOLR
INTRAVENOUS | Status: AC
  Filled 2018-07-16: qty 1000

## 2018-07-16 MED ORDER — CYCLOBENZAPRINE HCL 10 MG PO TABS
10.0000 mg | ORAL_TABLET | Freq: Three times a day (TID) | ORAL | Status: DC | PRN
  Administered 2018-07-16 – 2018-07-17 (×4): 10 mg via ORAL
  Filled 2018-07-16 (×3): qty 1

## 2018-07-16 MED ORDER — ACETAMINOPHEN 650 MG RE SUPP: 650.0000 mg | RECTAL | Status: DC | PRN

## 2018-07-16 MED ORDER — PHENYLEPHRINE 40 MCG/ML (10ML) SYRINGE FOR IV PUSH (FOR BLOOD PRESSURE SUPPORT)
PREFILLED_SYRINGE | INTRAVENOUS | Status: AC
  Filled 2018-07-16: qty 10

## 2018-07-16 MED ORDER — DEXAMETHASONE SODIUM PHOSPHATE 10 MG/ML IJ SOLN
INTRAMUSCULAR | Status: DC | PRN
  Administered 2018-07-16: 10 mg via INTRAVENOUS

## 2018-07-16 MED ORDER — THROMBIN 5000 UNITS EX SOLR
OROMUCOSAL | Status: DC | PRN
  Administered 2018-07-16: 07:00:00 via TOPICAL

## 2018-07-16 MED ORDER — CHOLESTYRAMINE 4 G PO PACK
4.0000 g | PACK | Freq: Every day | ORAL | Status: DC
  Administered 2018-07-16 – 2018-07-17 (×2): 4 g via ORAL
  Filled 2018-07-16 (×3): qty 1

## 2018-07-16 MED ORDER — SODIUM CHLORIDE 0.9 % IV SOLN
INTRAVENOUS | Status: DC | PRN
  Administered 2018-07-16: 07:00:00

## 2018-07-16 MED ORDER — LIDOCAINE 2% (20 MG/ML) 5 ML SYRINGE
INTRAMUSCULAR | Status: DC | PRN
  Administered 2018-07-16: 60 mg via INTRAVENOUS

## 2018-07-16 MED ORDER — GABAPENTIN 400 MG PO CAPS
400.0000 mg | ORAL_CAPSULE | Freq: Three times a day (TID) | ORAL | Status: DC
  Administered 2018-07-16 – 2018-07-17 (×5): 400 mg via ORAL
  Filled 2018-07-16 (×5): qty 1

## 2018-07-16 MED ORDER — LIDOCAINE 2% (20 MG/ML) 5 ML SYRINGE
INTRAMUSCULAR | Status: AC
  Filled 2018-07-16: qty 5

## 2018-07-16 MED ORDER — BISACODYL 10 MG RE SUPP: 10.0000 mg | Freq: Every day | RECTAL | Status: DC | PRN

## 2018-07-16 MED ORDER — BENZONATATE 100 MG PO CAPS: 200.0000 mg | ORAL_CAPSULE | Freq: Three times a day (TID) | ORAL | Status: DC | PRN

## 2018-07-16 MED ORDER — ONDANSETRON HCL 4 MG/2ML IJ SOLN
INTRAMUSCULAR | Status: DC | PRN
  Administered 2018-07-16 (×2): 4 mg via INTRAVENOUS

## 2018-07-16 MED ORDER — DOCUSATE SODIUM 100 MG PO CAPS
100.0000 mg | ORAL_CAPSULE | Freq: Two times a day (BID) | ORAL | Status: DC
  Administered 2018-07-16 – 2018-07-17 (×4): 100 mg via ORAL
  Filled 2018-07-16 (×4): qty 1

## 2018-07-16 MED ORDER — KETAMINE HCL 50 MG/5ML IJ SOSY
PREFILLED_SYRINGE | INTRAMUSCULAR | Status: AC
  Filled 2018-07-16: qty 5

## 2018-07-16 MED ORDER — LACTATED RINGERS IV SOLN
INTRAVENOUS | Status: DC | PRN
  Administered 2018-07-16: 07:00:00 via INTRAVENOUS

## 2018-07-16 MED ORDER — OXYCODONE-ACETAMINOPHEN 10-325 MG PO TABS: 1.0000 | ORAL_TABLET | ORAL | Status: DC | PRN

## 2018-07-16 MED ORDER — CEFAZOLIN SODIUM-DEXTROSE 2-4 GM/100ML-% IV SOLN
2.0000 g | Freq: Three times a day (TID) | INTRAVENOUS | Status: AC
  Administered 2018-07-16 (×2): 2 g via INTRAVENOUS
  Filled 2018-07-16 (×2): qty 100

## 2018-07-16 MED ORDER — SODIUM CHLORIDE 0.9 % IV SOLN: 250.0000 mL | INTRAVENOUS | Status: DC

## 2018-07-16 MED ORDER — PROPOFOL 10 MG/ML IV BOLUS
INTRAVENOUS | Status: DC | PRN
  Administered 2018-07-16: 150 mg via INTRAVENOUS

## 2018-07-16 MED ORDER — OXYCODONE-ACETAMINOPHEN 5-325 MG PO TABS: 1.0000 | ORAL_TABLET | ORAL | Status: DC | PRN

## 2018-07-16 MED ORDER — BUPIVACAINE LIPOSOME 1.3 % IJ SUSP
20.0000 mL | INTRAMUSCULAR | Status: AC
  Administered 2018-07-16: 20 mL
  Filled 2018-07-16: qty 20

## 2018-07-16 MED ORDER — MORPHINE SULFATE (PF) 4 MG/ML IV SOLN
4.0000 mg | INTRAVENOUS | Status: DC | PRN
  Administered 2018-07-16 (×2): 4 mg via INTRAVENOUS
  Filled 2018-07-16 (×2): qty 1

## 2018-07-16 SURGICAL SUPPLY — 65 items
BAG DECANTER FOR FLEXI CONT (MISCELLANEOUS) ×3 IMPLANT
BASKET BONE COLLECTION (BASKET) ×3 IMPLANT
BENZOIN TINCTURE PRP APPL 2/3 (GAUZE/BANDAGES/DRESSINGS) ×3 IMPLANT
BLADE CLIPPER SURG (BLADE) IMPLANT
BUR MATCHSTICK NEURO 3.0 LAGG (BURR) ×3 IMPLANT
BUR PRECISION FLUTE 6.0 (BURR) ×3 IMPLANT
CAGE ALTERA 10X31MM-10-14-15 (Cage) ×1 IMPLANT
CAGE ALTERA 10X31X10-14 15D (Cage) ×2 IMPLANT
CANISTER SUCT 3000ML PPV (MISCELLANEOUS) ×3 IMPLANT
CAP REVERE LOCKING (Cap) ×12 IMPLANT
CARTRIDGE OIL MAESTRO DRILL (MISCELLANEOUS) ×1 IMPLANT
CLOSURE WOUND 1/2 X4 (GAUZE/BANDAGES/DRESSINGS) ×1
CONT SPEC 4OZ CLIKSEAL STRL BL (MISCELLANEOUS) ×3 IMPLANT
COVER BACK TABLE 60X90IN (DRAPES) ×3 IMPLANT
COVER WAND RF STERILE (DRAPES) ×3 IMPLANT
DECANTER SPIKE VIAL GLASS SM (MISCELLANEOUS) ×3 IMPLANT
DERMABOND ADVANCED (GAUZE/BANDAGES/DRESSINGS) ×2
DERMABOND ADVANCED .7 DNX12 (GAUZE/BANDAGES/DRESSINGS) ×1 IMPLANT
DIFFUSER DRILL AIR PNEUMATIC (MISCELLANEOUS) ×3 IMPLANT
DRAPE C-ARM 42X72 X-RAY (DRAPES) ×6 IMPLANT
DRAPE HALF SHEET 40X57 (DRAPES) ×3 IMPLANT
DRAPE LAPAROTOMY 100X72X124 (DRAPES) ×3 IMPLANT
DRAPE SURG 17X23 STRL (DRAPES) ×12 IMPLANT
ELECT BLADE 4.0 EZ CLEAN MEGAD (MISCELLANEOUS) ×3
ELECT REM PT RETURN 9FT ADLT (ELECTROSURGICAL) ×3
ELECTRODE BLDE 4.0 EZ CLN MEGD (MISCELLANEOUS) ×1 IMPLANT
ELECTRODE REM PT RTRN 9FT ADLT (ELECTROSURGICAL) ×1 IMPLANT
EVACUATOR 1/8 PVC DRAIN (DRAIN) IMPLANT
GAUZE 4X4 16PLY RFD (DISPOSABLE) ×3 IMPLANT
GAUZE SPONGE 4X4 12PLY STRL (GAUZE/BANDAGES/DRESSINGS) ×3 IMPLANT
GLOVE BIO SURGEON STRL SZ8 (GLOVE) ×6 IMPLANT
GLOVE BIO SURGEON STRL SZ8.5 (GLOVE) ×6 IMPLANT
GLOVE EXAM NITRILE XL STR (GLOVE) IMPLANT
GOWN STRL REUS W/ TWL LRG LVL3 (GOWN DISPOSABLE) ×1 IMPLANT
GOWN STRL REUS W/ TWL XL LVL3 (GOWN DISPOSABLE) ×3 IMPLANT
GOWN STRL REUS W/TWL 2XL LVL3 (GOWN DISPOSABLE) IMPLANT
GOWN STRL REUS W/TWL LRG LVL3 (GOWN DISPOSABLE) ×2
GOWN STRL REUS W/TWL XL LVL3 (GOWN DISPOSABLE) ×6
HEMOSTAT POWDER KIT SURGIFOAM (HEMOSTASIS) ×3 IMPLANT
KIT BASIN OR (CUSTOM PROCEDURE TRAY) ×3 IMPLANT
KIT TURNOVER KIT B (KITS) ×3 IMPLANT
MILL MEDIUM DISP (BLADE) ×3 IMPLANT
NEEDLE HYPO 21X1.5 SAFETY (NEEDLE) ×3 IMPLANT
NEEDLE HYPO 22GX1.5 SAFETY (NEEDLE) ×3 IMPLANT
NS IRRIG 1000ML POUR BTL (IV SOLUTION) ×3 IMPLANT
OIL CARTRIDGE MAESTRO DRILL (MISCELLANEOUS) ×3
PACK LAMINECTOMY NEURO (CUSTOM PROCEDURE TRAY) ×3 IMPLANT
PAD ARMBOARD 7.5X6 YLW CONV (MISCELLANEOUS) ×9 IMPLANT
PATTIES SURGICAL .5 X1 (DISPOSABLE) IMPLANT
PUTTY DBM 5CC CALC GRAN ×3 IMPLANT
ROD REVERE 6.35 40MM (Rod) ×6 IMPLANT
SCREW REVERE 6.35 6.5MMX45 (Screw) ×6 IMPLANT
SCREW REVERE 6.5X50MM (Screw) ×6 IMPLANT
SPONGE LAP 4X18 RFD (DISPOSABLE) IMPLANT
SPONGE NEURO XRAY DETECT 1X3 (DISPOSABLE) IMPLANT
SPONGE SURGIFOAM ABS GEL 100 (HEMOSTASIS) IMPLANT
STRIP CLOSURE SKIN 1/2X4 (GAUZE/BANDAGES/DRESSINGS) ×2 IMPLANT
SUT VIC AB 1 CT1 18XBRD ANBCTR (SUTURE) ×2 IMPLANT
SUT VIC AB 1 CT1 8-18 (SUTURE) ×4
SUT VIC AB 2-0 CP2 18 (SUTURE) ×9 IMPLANT
SYR 20CC LL (SYRINGE) ×3 IMPLANT
TOWEL GREEN STERILE (TOWEL DISPOSABLE) ×3 IMPLANT
TOWEL GREEN STERILE FF (TOWEL DISPOSABLE) ×3 IMPLANT
TRAY FOLEY MTR SLVR 16FR STAT (SET/KITS/TRAYS/PACK) ×3 IMPLANT
WATER STERILE IRR 1000ML POUR (IV SOLUTION) ×3 IMPLANT

## 2018-07-16 NOTE — Transfer of Care (Signed)
Immediate Anesthesia Transfer of Care Note  Patient: [name] [name]  Procedure(s) Performed: Lumbar 4-5 Posterior lumbar interbody fusion (N/A )  Patient Location: PACU  Anesthesia Type:General  Level of Consciousness: awake, alert  and oriented  Airway & Oxygen Therapy: Patient Spontanous Breathing and Patient connected to face mask oxygen  Post-op Assessment: Report given to RN and Post -op Vital signs reviewed and stable  Post vital signs: Reviewed and stable  Last Vitals:  Vitals Value Taken Time  BP 132/85 07/16/2018 10:24 AM  Temp    Pulse 97 07/16/2018 10:29 AM  Resp 16 07/16/2018 10:29 AM  SpO2 100 % 07/16/2018 10:29 AM  Vitals shown include unvalidated device data.  Last Pain:  Vitals:   07/16/18 0624  TempSrc:   PainSc: 3       Patients Stated Pain Goal: 2 (56/21/30 8657)  Complications: No apparent anesthesia complications

## 2018-07-16 NOTE — H&P (Signed)
Subjective: The patient is a 63 year old white female who has previous back surgery.  She has complained of back and leg pain consistent with neurogenic claudication.  She has failed medical management and was worked up with lumbar x-rays and lumbar MRI.  This demonstrated an L4-5 spondylolisthesis, facet arthropathy, spinal stenosis, etc.  I discussed the various treatment options with her.  She has decided proceed with surgery.  Past Medical History:  Diagnosis Date  . Allergic rhinitis   . Anastomotic ulcer JAN 2009  . Anxiety   . Arthritis   . Asthma   . BMI (body mass index) 20.0-29.9 2009 121 lbs  . Concussion   . 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 Crohn disease ([place])   . Crohn disease ([place])   . CTS (carpal tunnel syndrome)   . Diarrhea MULITIFACTORIAL   IBS, LACTOSE INTOLERANCE, SBBO, BILE-SALT  . Elevated liver enzymes 2009: BMI 24 ?Etoh ALT 94, AST 51,  NEG ANA, qIGs& ASMA   MAY 2012 AST 52 ALT 38  . Esophageal stricture 2009  . GERD (gastroesophageal reflux disease)   . Hemorrhoid   . Hyperlipemia   . Hypertension   . Inflammatory bowel disease 2006 CD   SURGICAL REMISSION  . LBP (low back pain)   . Mental disorder   . Migraine   . Osteoporosis   . PONV (postoperative nausea and vomiting)   . Shortness of breath    exertion and humidity  . Shoulder pain    right  . Sleep apnea    Stop [name]    Past Surgical History:  Procedure Laterality Date  . BACK SURGERY    . BILATERAL SALPINGOOPHORECTOMY    . BIOPSY  12/24/2011   Procedure: BIOPSY;  Surgeon: Danie Binder, MD;  Location: [facility];  Service: Endoscopy;;  Gastric Biopsies  . BIOPSY N/A 06/30/2012   Procedure: BIOPSY;  Surgeon: Danie Binder, MD;  Location: [facility];  Service: Endoscopy;  Laterality: N/A;  Gastric and Esophageal Biopsies  . CARPAL TUNNEL RELEASE  LEFT  . CATARACT EXTRACTION W/PHACO Right 07/30/2016   Procedure: CATARACT EXTRACTION PHACO AND INTRAOCULAR LENS PLACEMENT (IOC);   Surgeon: Rutherford Guys, MD;  Location: [facility];  Service: Ophthalmology;  Laterality: Right;  CDE: 5.45  . CATARACT EXTRACTION W/PHACO Left 08/13/2016   Procedure: CATARACT EXTRACTION PHACO AND INTRAOCULAR LENS PLACEMENT (IOC);  Surgeon: Rutherford Guys, MD;  Location: [facility];  Service: Ophthalmology;  Laterality: Left;  CDE: 5.59  . COLON SURGERY    . COLONOSCOPY  JAN 2009 DIARRHEA, ABD PAIN, BRBPR   ANASTOMOTIC ULCER(3MM), IH XB:JYNWGN ULCER, NL COLON bX  . COLONOSCOPY WITH PROPOFOL N/A 04/21/2018   Procedure: COLONOSCOPY WITH PROPOFOL;  Surgeon: Danie Binder, MD;  [facility];  Service: Endoscopy;  Laterality: N/A;  1:15pm  . DILATION AND CURETTAGE OF UTERUS    . ESOPHAGEAL DILATION  12/24/2011   SLF: A stricture was found in the distal esophagus/ Moderate gastritis/ MILD Duodenitis  . ESOPHAGOGASTRODUODENOSCOPY  02/07/09   mild gastritis  . ESOPHAGOGASTRODUODENOSCOPY (EGD) WITH PROPOFOL N/A 06/30/2012   SLF: 1. No definite stricture appreciated 2. small hiatal hernia 3. Gastritis  . ESOPHAGOGASTRODUODENOSCOPY (EGD) WITH PROPOFOL N/A 02/20/2016   Procedure: ESOPHAGOGASTRODUODENOSCOPY (EGD) WITH PROPOFOL;  Surgeon: Danie Binder, MD;  [facility];  Service: Endoscopy;  Laterality: N/A;  930  . EYE SURGERY    . gum flap Bilateral   . HEMICOLECTOMY  RIGHT  2006  . HERNIA REPAIR    . LUMBAR DISC SURGERY    . POLYPECTOMY  04/21/2018   Procedure: POLYPECTOMY;  Surgeon: Danie Binder, MD;  [facility];  Service: Endoscopy;;  (colon)  . SAVORY DILATION N/A 06/30/2012   Procedure: SAVORY DILATION;  Surgeon: Danie Binder, MD;  Location: [facility];  Service: Endoscopy;  Laterality: N/A;  12.70m , 114m 1569m. SAVORY DILATION N/A 02/20/2016   Procedure: SAVORY DILATION;  Surgeon: SanDanie BinderD;  [facility];  Service: Endoscopy;  Laterality: N/A;  . UPPER GASTROINTESTINAL ENDOSCOPY  JAN 2009 ABD PAIN   GASTRITIS, ESO RING  . UPPER GASTROINTESTINAL  ENDOSCOPY  OCT 2010 ABD PAIN, DYSPHAGIA   DIL 15MM, GASTRITIS, NL DUODENUM  . UPPER GASTROINTESTINAL ENDOSCOPY  OCT 2010 DYSPHAGIA   DIL 17 MM, GASTRITIS, NL DUODENUM  . vocal cord surgery  Sep 20, 2010   precancerous areas removed  . wisdom tooth extraction Right    pin placement due to broken jaw    Allergies  Allergen Reactions  . Naproxen Shortness Of Breath    Chest pain  . Eggs Or Egg-Derived Products Diarrhea    abd pain  . Lovastatin Other (See Comments)    Chest pain  . Toradol [Ketorolac Tromethamine] Other (See Comments)    Headache. Non-effective  . Tramadol Other (See Comments)    Headache. Non-effective.  . NMarland Kitchenxium [Esomeprazole Magnesium] Diarrhea    Abdominal pain    Social History   Tobacco Use  . Smoking status: Current Every Day Smoker    Packs/day: 1.00    Years: 30.00    Pack years: 30.00    Types: Cigarettes  . Smokeless tobacco: Never Used  Substance Use Topics  . Alcohol use: Yes    Comment: "beer here and there"    Family History  Problem Relation Age of Onset  . Hypothyroidism Mother   . Heart disease Father   . Parkinson's disease Father   . Hypothyroidism Daughter   . Heart attack Paternal Grandfather   . Alzheimer's disease Paternal Grandmother   . Heart attack Maternal Grandfather   . Crohn's disease Sister   . Other Sister        was murdered  . Crohn's disease Maternal Uncle   . Colon cancer Neg Hx   . Colon polyps Neg Hx    Prior to Admission medications   Medication Sig Start Date End Date Taking? Authorizing Provider  amLODipine (NORVASC) 5 MG tablet TAKE 1 TABLET BY MOUTH ONCE DAILY Patient taking differently: Take 5 mg by mouth daily.  08/29/17  Yes [place], KawModena NunneryD  benzonatate (TESSALON) 200 MG capsule Take 200 mg by mouth 3 (three) times daily as needed for cough.   Yes [provider]  calcium elemental as carbonate (BARIATRIC TUMS ULTRA) 400 MG chewable tablet Chew 1,000 mg by mouth 2 (two) times daily.    Yes [provider]  cholestyramine (QUESTRAN) 4 GM/DOSE powder DISSOLVE AND TAKE 2 GRAMS BY MOUTH TWICE DAILY WITH A MEAL- DO NOT TAKE WITHIN 2 HOURS OF OTHER MEDICATIONS Patient taking differently: Take 4 g by mouth daily.  06/25/17  Yes LewMahala MenghiniA-C  gabapentin (NEURONTIN) 400 MG capsule Take 400 mg by mouth 3 (three) times daily.  01/17/16  Yes [provider]  Lidocaine HCl (ASPERCREME LIDOCAINE) 4 % LIQD Apply 1 application topically 3 (three) times daily as needed (pain).   Yes [provider]  Menthol (BIOFREEZE) 10 % AERO Apply 1 application topically 3 (three) times daily as needed (pain).   Yes [provider]  nystatin (MYCOSTATIN) 100000 UNIT/ML suspension Take 5 mLs (500,000 Units total) by mouth daily as needed (thrush). TAKE  5 ML BY MOUTH 4 TIMES DAILY AS NEEDED 06/08/18  Yes [place], Modena Nunnery, MD  oxyCODONE-acetaminophen (PERCOCET) 10-325 MG tablet Take 1 tablet by mouth 4 (four) times daily as needed for pain.  01/31/17  Yes [provider]  POTASSIUM PO Take 50 mg by mouth daily.    Yes [provider]  Probiotic Product ([place]) Take 1 capsule by mouth daily.   Yes [provider]  SYMBICORT 80-4.5 MCG/ACT inhaler INHALE 2 PUFFS BY MOUTH TWICE DAILY Patient taking differently: Inhale 2 puffs into the lungs daily as needed (wheezing / shortness of breath).  10/27/17  Yes [place], Modena Nunnery, MD  Turmeric 500 MG CAPS Take 500 mg by mouth daily.    Yes [provider]  VENTOLIN HFA 108 (90 Base) MCG/ACT inhaler INHALE 2 PUFFS BY MOUTH EVERY 4 HOURS AS NEEDED FOR WHEEZING Patient taking differently: Inhale 2 puffs into the lungs every 4 (four) hours as needed for wheezing or shortness of breath. INHALE 2 PUFFS BY MOUTH EVERY 4 HOURS AS NEEDED FOR WHEEZING 06/08/18  Yes [place], Modena Nunnery, MD  calcium carbonate (TUMS - DOSED IN MG ELEMENTAL CALCIUM) 500 MG chewable tablet Chew 1 tablet (200 mg of  elemental calcium total) by mouth 3 (three) times daily with meals. Patient not taking: Reported on 07/01/2018 04/16/18   Cassandria Anger, MD  Cholecalciferol (VITAMIN D3) 125 MCG (5000 UT) CAPS Take 1 capsule (5,000 Units total) by mouth daily. Patient not taking: Reported on 07/01/2018 04/16/18   Cassandria Anger, MD  cyclobenzaprine (FLEXERIL) 10 MG tablet Take 10 mg by mouth daily as needed for muscle spasms.  03/12/18   [provider]  denosumab (PROLIA) 60 MG/ML SOSY injection Inject 60 mg into the skin every 6 (six) months.    [provider]  dexlansoprazole (DEXILANT) 60 MG capsule 1 PO EVERY MORNING WITH BREAKFAST. Patient taking differently: Take 60 mg by mouth daily.  02/20/16   [place], Marga Melnick, MD  diazepam (VALIUM) 5 MG tablet TAKE 1 TABLET BY MOUTH EVERY 12 HOURS AS NEEDED FOR ANXIETY Patient taking differently: Take 5 mg by mouth every 12 (twelve) hours as needed for anxiety.  10/08/17   , Modena Nunnery, MD  fluticasone (FLONASE) 50 MCG/ACT nasal spray USE TWO SPRAY(S) IN EACH NOSTRIL ONCE DAILY AS NEEDED FOR ALLERGY OR RHINITIS Patient taking differently: Place 2 sprays into both nostrils daily as needed for allergies.  11/27/15   Alycia Rossetti, MD  triamcinolone ointment (KENALOG) 0.5 % Apply 1 application topically 2 (two) times daily. 04/02/17   Estill Dooms, NP  Vitamin D, Ergocalciferol, (DRISDOL) 50000 units CAPS capsule Take 1 capsule (50,000 Units total) by mouth every 7 (seven) days. Patient not taking: Reported on 07/01/2018 02/16/18   Danie Binder, MD     Review of Systems  Positive ROS: As above  All other systems have been reviewed and were otherwise negative with the exception of those mentioned in the HPI and as above.  Objective: Vital signs in last 24 hours: Temp:  [98 F (36.7 C)] 98 F (36.7 C) (03/12 0602) Pulse Rate:  [80] 80 (03/12 0602) Resp:  [18] 18 (03/12 0602) BP: (119)/(90) 119/90 (03/12  0602) Weight:   [45.8 kg] 45.8 kg (03/12 0602) Estimated body mass index is 19.73 kg/m as calculated from the following:   Height as of this encounter: 5' (1.524 m).   Weight as of this encounter: 45.8 kg.   General Appearance: Alert Head: Normocephalic, without obvious abnormality, atraumatic Eyes: PERRL, conjunctiva/corneas clear, EOM's intact,    Ears: Normal  Throat: Normal  Neck: Supple, Back: Lumbar incisions well-healed. Lungs: Clear to auscultation bilaterally, respirations unlabored Heart: Regular rate and rhythm, no murmur, rub or gallop Abdomen: Soft, non-tender Extremities: Extremities normal, atraumatic, no cyanosis or edema Skin: unremarkable  NEUROLOGIC:   Mental status: alert and oriented,Motor Exam - grossly normal Sensory Exam - grossly normal Reflexes:  Coordination - grossly normal Gait - grossly normal Balance - grossly normal Cranial Nerves: I: smell Not tested  II: visual acuity  OS: Normal  OD: Normal   II: visual fields Full to confrontation  II: pupils Equal, round, reactive to light  III,VII: ptosis None  III,IV,VI: extraocular muscles  Full ROM  V: mastication Normal  V: facial light touch sensation  Normal  V,VII: corneal reflex  Present  VII: facial muscle function - upper  Normal  VII: facial muscle function - lower Normal  VIII: hearing Not tested  IX: soft palate elevation  Normal  IX,X: gag reflex Present  XI: trapezius strength  5/5  XI: sternocleidomastoid strength 5/5  XI: neck flexion strength  5/5  XII: tongue strength  Normal    Data Review Lab Results  Component Value Date   WBC 2.8 (L) 07/09/2018   HGB 14.9 07/09/2018   HCT 43.0 07/09/2018   MCV 104.1 (H) 07/09/2018   PLT 212 07/09/2018   Lab Results  Component Value Date   NA 138 07/09/2018   K 3.6 07/09/2018   CL 103 07/09/2018   CO2 25 07/09/2018   BUN 6 07/09/2018   CREATININE 0.55 07/09/2018   GLUCOSE 65 (L) 07/09/2018   Lab Results  Component Value Date   INR 0.95  10/17/2009    Assessment/Plan: L4-5 spinal listhesis, spinal stenosis, facet arthropathy, lumbago, lumbar radiculopathy, neurogenic claudication: I have discussed the situation with the patient.  I have reviewed her imaging studies with her and pointed out the abnormalities.  We have discussed the various treatment options including surgery.  I have described the surgical treatment option of an L4-5 decompression, instrumentation, and fusion.  I have shown her surgical models.  I have given her a surgical pamphlet.  We have discussed the risks, benefits, alternatives, expected postoperative course, and likelihood of achieving our goals with surgery.  I have answered all the patient's questions.  She has decided to proceed with surgery.   Ophelia Charter 07/16/2018 7:21 AM

## 2018-07-16 NOTE — Progress Notes (Signed)
Orthopedic Tech Progress Note Patient Details:  [name] [name] April 30, 1956 [id number] Called in order brace order Patient ID: [name] [name], female   DOB: Jan 19, 1956, 63 y.o.   MRN: [id number]   Janit Pagan 07/16/2018, 12:36 PM

## 2018-07-16 NOTE — Anesthesia Procedure Notes (Signed)
Procedure Name: Intubation Date/Time: 07/16/2018 7:43 AM Performed by: Gwyndolyn Saxon, CRNA Pre-anesthesia Checklist: Patient identified, Emergency Drugs available, Suction available and Patient being monitored Patient Re-evaluated:Patient Re-evaluated prior to induction Oxygen Delivery Method: Circle system utilized Preoxygenation: Pre-oxygenation with 100% oxygen Induction Type: IV induction Ventilation: Mask ventilation without difficulty Laryngoscope Size: Miller and 2 Grade View: Grade I Tube type: Oral Tube size: 6.5 mm Number of attempts: 1 Airway Equipment and Method: Stylet Placement Confirmation: ETT inserted through vocal cords under direct vision,  positive ETCO2 and breath sounds checked- equal and bilateral Secured at: 19 cm Tube secured with: Tape Dental Injury: Teeth and Oropharynx as per pre-operative assessment

## 2018-07-16 NOTE — Op Note (Signed)
Brief history: The patient is a 63 year old white female whose had previous back surgery.  She is developed recurrent back and left leg pain.  She has failed medical management and was worked up with a lumbar MRI and lumbar x-rays which demonstrated an L4-5 spondylolisthesis and spinal stenosis.  I discussed the various treatment options with the patient.  She has decided to proceed with surgery.  Preoperative diagnosis: L4-5 spondylolisthesis, facet arthropathy, degenerative disc disease, spinal stenosis compressing both the L4 and the L5 nerve roots; lumbago; lumbar radiculopathy; neurogenic claudication  Postoperative diagnosis: The same  Procedure: Bilateral L4-5 laminotomy/foraminotomies/medial facetectomy to decompress the bilateral L4 and L5 nerve roots(the work required to do this was in addition to the work required to do the posterior lumbar interbody fusion because of the patient's spinal stenosis, facet arthropathy. Etc. requiring a wide decompression of the nerve roots.);  L4-5 transforaminal lumbar interbody fusion with local morselized autograft bone and Zimmer DBM; insertion of interbody prosthesis at L4-5 (globus peek expandable interbody prosthesis); posterior nonsegmental instrumentation from L4 to L5 with globus titanium pedicle screws and rods; posterior lateral arthrodesis at L4-5 with local morselized autograft bone and Zimmer DBM.  Surgeon: Dr. Earle Gell  Asst.: Arnetha Massy nurse practitioner  Anesthesia: Gen. endotracheal  Estimated blood loss: 150 cc  Drains: None  Complications: None  Description of procedure: The patient was brought to the operating room by the anesthesia team. General endotracheal anesthesia was induced. The patient was turned to the prone position on the Wilson frame. The patient's lumbosacral region was then prepared with Betadine scrub and Betadine solution. Sterile drapes were applied.  I then injected the area to be incised with Marcaine  with epinephrine solution. I then used the scalpel to make a linear midline incision over the L4-5 interspace. I then used electrocautery to perform a bilateral subperiosteal dissection exposing the spinous process and lamina of L4 and L5. We then obtained intraoperative radiograph to confirm our location. We then inserted the Verstrac retractor to provide exposure.  I began the decompression by using the high speed drill to perform laminotomies at L4-5 bilaterally. We then used the Kerrison punches to widen the laminotomy and removed the ligamentum flavum at L4-5 bilaterally. We used the Kerrison punches to remove the medial facets at L4-5 bilaterally. We performed wide foraminotomies about the bilateral L4 and L5 nerve roots completing the decompression.  We now turned our attention to the posterior lumbar interbody fusion. I used a scalpel to incise the intervertebral disc at L4-5 bilaterally. I then performed a partial intervertebral discectomy at L4-5 bilaterally using the pituitary forceps. We prepared the vertebral endplates at P1-0 bilaterally for the fusion by removing the soft tissues with the curettes. We then used the trial spacers to pick the appropriate sized interbody prosthesis. We prefilled his prosthesis with a combination of local morselized autograft bone that we obtained during the decompression as well as Zimmer DBM. We inserted the prefilled prosthesis into the interspace at L4-5 from the left, we then turned and expanded the prosthesis. There was a good snug fit of the prosthesis in the interspace. We then filled and the remainder of the intervertebral disc space with local morselized autograft bone and Zimmer DBM. This completed the posterior lumbar interbody arthrodesis.  We now turned attention to the instrumentation. Under fluoroscopic guidance we cannulated the bilateral L4 and L5 pedicles with the bone probe. We then removed the bone probe. We then tapped the pedicle with a 5.5  millimeter tap.  We then removed the tap. We probed inside the tapped pedicle with a ball probe to rule out cortical breaches. We then inserted a 6.5 x 45 and 50 millimeter pedicle screw into the L4 and L5 pedicles bilaterally under fluoroscopic guidance. We then palpated along the medial aspect of the pedicles to rule out cortical breaches. There were none. The nerve roots were not injured. We then connected the unilateral pedicle screws with a lordotic rod. We compressed the construct and secured the rod in place with the caps. We then tightened the caps appropriately. This completed the instrumentation from L4-5.  We now turned our attention to the posterior lateral arthrodesis at L4-5 bilaterally. We used the high-speed drill to decorticate the remainder of the facets, pars, transverse process at L4-5 bilaterally. We then applied a combination of local morselized autograft bone and Zimmer DBM over these decorticated posterior lateral structures. This completed the posterior lateral arthrodesis.  We then obtained hemostasis using bipolar electrocautery. We irrigated the wound out with bacitracin solution. We inspected the thecal sac and nerve roots and noted they were well decompressed. We then removed the retractor. We placed vancomycin powder in the wound.  We injected Exparel . We reapproximated patient's thoracolumbar fascia with interrupted #1 Vicryl suture. We reapproximated patient's subcutaneous tissue with interrupted 2-0 Vicryl suture. The reapproximated patient's skin with Steri-Strips and benzoin. The wound was then coated with bacitracin ointment. A sterile dressing was applied. The drapes were removed. The patient was subsequently returned to the supine position where they were extubated by the anesthesia team. He was then transported to the post anesthesia care unit in stable condition. All sponge instrument and needle counts were reportedly correct at the end of this case.

## 2018-07-17 LAB — BASIC METABOLIC PANEL
ANION GAP: 11 (ref 5–15)
BUN: 8 mg/dL (ref 6–20)
CHLORIDE: 101 mmol/L (ref 98–111)
CO2: 25 mmol/L (ref 22–32)
Calcium: 8.6 mg/dL — ABNORMAL LOW (ref 8.9–10.3)
Creatinine, Ser: 0.84 mg/dL (ref 0.44–1.00)
GFR calc Af Amer: >60 mL/min (ref >60)
GFR calc non Af Amer: >60 mL/min (ref >60)
Glucose, Bld: 133 mg/dL — ABNORMAL HIGH (ref 70–99)
Potassium: 3.2 mmol/L — ABNORMAL LOW (ref 3.5–5.1)
Sodium: 137 mmol/L (ref 135–145)

## 2018-07-17 LAB — CBC
HCT: 35.3 % — ABNORMAL LOW (ref 36.0–46.0)
Hemoglobin: 12.1 g/dL (ref 12.0–15.0)
MCH: 36.4 pg — ABNORMAL HIGH (ref 26.0–34.0)
MCHC: 34.3 g/dL (ref 30.0–36.0)
MCV: 106.3 fL — ABNORMAL HIGH (ref 80.0–100.0)
Platelets: 211 10*3/uL (ref 150–400)
RBC: 3.32 MIL/uL — ABNORMAL LOW (ref 3.87–5.11)
RDW: 13.2 % (ref 11.5–15.5)
WBC: 9 10*3/uL (ref 4.0–10.5)
nRBC: 0 % (ref 0.0–0.2)

## 2018-07-17 MED ORDER — CYCLOBENZAPRINE HCL 5 MG PO TABS: 5.0000 mg | ORAL_TABLET | Freq: Three times a day (TID) | ORAL | Status: DC | PRN

## 2018-07-17 NOTE — Progress Notes (Signed)
Subjective: The patient is alert and pleasant.  Her back is appropriately sore.  She wants to go home tomorrow.  Objective: Vital signs in last 24 hours: Temp:  [97 F (36.1 C)-98.9 F (37.2 C)] 98.1 F (36.7 C) (03/13 0752) Pulse Rate:  [66-97] 68 (03/13 0752) Resp:  [13-20] 16 (03/13 0752) BP: (94-132)/(62-91) 94/62 (03/13 0752) SpO2:  [95 %-100 %] 98 % (03/13 0752) Estimated body mass index is 19.73 kg/m as calculated from the following:   Height as of this encounter: 5' (1.524 m).   Weight as of this encounter: 45.8 kg.   Intake/Output from previous day: 03/12 0701 - 03/13 0700 In: 2317.7 [P.O.:720; I.V.:1497.7; IV Piggyback:100] Out: 330 [Urine:130; Blood:200] Intake/Output this shift: No intake/output data recorded.  Physical exam the patient is alert and pleasant.  Her dressing is clean and dry.  Her lower extremity strength is grossly normal.  Lab Results: Recent Labs    07/17/18 0609  WBC 9.0  HGB 12.1  HCT 35.3*  PLT 211   BMET Recent Labs    07/17/18 0609  NA 137  K 3.2*  CL 101  CO2 25  GLUCOSE 133*  BUN 8  CREATININE 0.84  CALCIUM 8.6*    Studies/Results: Dg Lumbar Spine 2-3 Views  Result Date: 07/16/2018 CLINICAL DATA:  L4-L5 PLIF EXAM: LUMBAR SPINE - 2-3 VIEW; DG C-ARM 61-120 MIN COMPARISON:  06/02/2018 FINDINGS: Intraoperative fluoroscopic images from L4-L5 posterior fusion with placement of intervening disc spacer demonstrate normal alignment of the orthopedic hardware. Fluoroscopy time is recorded as 24.2 seconds. IMPRESSION: Intraoperative fluoroscopic images from L4-L5 posterior fusion. Electronically Signed   By: Fidela Salisbury M.D.   On: 07/16/2018 11:22   Dg Lumbar Spine 1 View  Result Date: 07/16/2018 CLINICAL DATA:  Surgical localization. EXAM: LUMBAR SPINE - 1 VIEW COMPARISON:  MRI of April 23, 2018. FINDINGS: Single intraoperative cross-table lateral projection of the lumbar spine was obtained. This demonstrates surgical  probe directed toward L3-4 disc space. IMPRESSION: Surgical localization as described above. Electronically Signed   By: Marijo Conception, M.D.   On: 07/16/2018 14:12   Dg C-arm 1-60 Min  Result Date: 07/16/2018 CLINICAL DATA:  L4-L5 PLIF EXAM: LUMBAR SPINE - 2-3 VIEW; DG C-ARM 61-120 MIN COMPARISON:  06/02/2018 FINDINGS: Intraoperative fluoroscopic images from L4-L5 posterior fusion with placement of intervening disc spacer demonstrate normal alignment of the orthopedic hardware. Fluoroscopy time is recorded as 24.2 seconds. IMPRESSION: Intraoperative fluoroscopic images from L4-L5 posterior fusion. Electronically Signed   By: Fidela Salisbury M.D.   On: 07/16/2018 11:22    Assessment/Plan: Postoperative day #1: The patient is doing well.  We will mobilize her with PT and OT.  She will likely go home tomorrow.  I gave her her discharge instructions and answered all her questions.  LOS: 1 day     Ophelia Charter 07/17/2018, 8:03 AM

## 2018-07-17 NOTE — Evaluation (Signed)
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 Pt admitted 07/16/18 for L 4-5 decompression and PLIF.  Clinical Impression  Pt admitted with above diagnosis. Pt currently with functional limitations due to the deficits listed below (see PT Problem List). At the time of PT eval pt was very painful and refused to attempt ambulation. Pt agreeable to stand EOB so therapist could judge appropriate walker for her to have upon d/c (recommend youth walker). Focus of session was education on precautions and positioning recommendations, and independence with brace application. Will need gait and stair training prior to d/c (planned for tomorrow morning by 10). Pt will benefit from skilled PT to increase their independence and safety with mobility to allow discharge to the venue listed below.       Follow Up Recommendations Home health PT;Supervision for mobility/OOB    Equipment Recommendations  3in1 (PT);Rolling walker with 5" wheels(youth sized walker)    Recommendations for Other Services       Precautions / Restrictions Precautions Precautions: Fall;Back Precaution Booklet Issued: Yes (comment) Precaution Comments: Reviewed handout in detail. Pt was cued for precautions during functional mobility.  Required Braces or Orthoses: Spinal Brace Spinal Brace: Lumbar corset;Applied in sitting position(can walk to bathroom without brace) Restrictions Weight Bearing Restrictions: No      Mobility  Bed Mobility Overal bed mobility: Needs Assistance Bed Mobility: Rolling;Sidelying to Sit;Sit to Sidelying Rolling: Supervision Sidelying to sit: Min guard     Sit to sidelying: Min assist General bed mobility comments: Heavy  use of rails to transition to EOB and min assist to elevate LE's back up into bed at end of session.   Transfers Overall transfer level: Needs assistance Equipment used: Rolling walker (2 wheeled) Transfers: Sit to/from Stand Sit to Stand: Min guard         General transfer comment: Hands on guarding for safety as pt powered up to full stand. VC's for hand placement on seated surface for safety prior to stand>sit.   Ambulation/Gait             General Gait Details: Pt refused ambulation due to pain  Stairs            Wheelchair Mobility    Modified Rankin (Stroke Patients Only)       Balance Overall balance assessment: Needs assistance   Sitting balance-Leahy Scale: Fair     Standing balance support: Bilateral upper extremity supported Standing balance-Leahy Scale: Poor Standing balance comment: Pt getting episodes of sudden pain and knees appeared to be slightly buckling when this happens as well.                              Pertinent Vitals/Pain Pain Assessment: Faces Faces Pain Scale: Hurts whole lot Pain Location: back Pain Descriptors / Indicators: Aching Pain Intervention(s): Monitored during session;Repositioned    Home Living Family/patient expects to be discharged to:: Private residence Living Arrangements: Spouse/significant other([name]) Available Help at Discharge: Friend(s);Available 24 hours/day(boyfriend) Type of Home: House Home Access: Stairs to enter Entrance Stairs-Rails: Left Entrance Stairs-Number of Steps: 3 Home Layout: One level Home Equipment: Walker - standard;Shower seat - built in;Hand held shower head;Grab bars -  tub/shower      Prior Function Level of Independence: Needs assistance      ADL's / Homemaking Assistance Needed: boyfriend Patrick Jupiter has been assisting with IADL since December.        Hand Dominance   Dominant Hand: Right    Extremity/Trunk Assessment   Upper Extremity Assessment Upper  Extremity Assessment: Defer to OT evaluation    Lower Extremity Assessment Lower Extremity Assessment: Generalized weakness(Consistent with pre-op diagnosis)    Cervical / Trunk Assessment Cervical / Trunk Assessment: Other exceptions Cervical / Trunk Exceptions: s/p surgery  Communication   Communication: No difficulties  Cognition Arousal/Alertness: Awake/alert Behavior During Therapy: WFL for tasks assessed/performed Overall Cognitive Status: Within Functional Limits for tasks assessed                                        General Comments      Exercises     Assessment/Plan    PT Assessment Patient needs continued PT services  PT Problem List Decreased strength;Decreased activity tolerance;Decreased balance;Decreased mobility;Decreased knowledge of use of DME;Decreased safety awareness;Decreased knowledge of precautions;Pain       PT Treatment Interventions DME instruction;Gait training;Stair training;Functional mobility training;Therapeutic activities;Therapeutic exercise;Neuromuscular re-education;Patient/family education    PT Goals (Current goals can be found in the Care Plan section)  Acute Rehab PT Goals Patient Stated Goal: to return home with assist of her boyfriend PT Goal Formulation: With patient Time For Goal Achievement: 07/24/18 Potential to Achieve Goals: Good    Frequency Min 5X/week   Barriers to discharge        Co-evaluation               AM-PAC PT "6 Clicks" Mobility  Outcome Measure Help needed turning from your back to your side while in a flat bed without using bedrails?: None Help needed moving from lying on your back to sitting on the side of a flat bed without using bedrails?: A Little Help needed moving to and from a bed to a chair (including a wheelchair)?: A Little Help needed standing up from a chair using your arms (e.g., wheelchair or bedside chair)?: A Little Help needed to walk in hospital room?: A  Little Help needed climbing 3-5 steps with a railing? : A Lot 6 Click Score: 18    End of Session Equipment Utilized During Treatment: Back brace Activity Tolerance: Patient limited by pain Patient left: in bed;with call bell/phone within reach Nurse Communication: Mobility status PT Visit Diagnosis: Unsteadiness on feet (R26.81);Pain;Other symptoms and signs involving the nervous system (R29.898) Pain - part of body: (back, hips, thighs)    Time: 5809-9833 PT Time Calculation (min) (ACUTE ONLY): 32 min   Charges:   PT Evaluation $PT Eval Moderate Complexity: 1 Mod PT Treatments $Therapeutic Activity: 8-22 mins        [name] [name], PT, DPT Acute Rehabilitation Services Pager: [phone] Office: [phone]   [name] [name] 07/17/2018, 11:43 AM

## 2018-07-17 NOTE — Anesthesia Postprocedure Evaluation (Signed)
Anesthesia Post Note  Patient: [name] [name]  Procedure(s) Performed: Lumbar four-five Posterior lumbar interbody fusion (N/A )     Patient location during evaluation: PACU Anesthesia Type: General Level of consciousness: awake and alert Pain management: pain level controlled Vital Signs Assessment: post-procedure vital signs reviewed and stable Respiratory status: spontaneous breathing, nonlabored ventilation, respiratory function stable and patient connected to nasal cannula oxygen Cardiovascular status: blood pressure returned to baseline and stable Postop Assessment: no apparent nausea or vomiting Anesthetic complications: no    Last Vitals:  Vitals:   07/17/18 0752 07/17/18 0834  BP: 94/62   Pulse: 68   Resp: 16   Temp: 36.7 C   SpO2: 98% 100%    Last Pain:  Vitals:   07/17/18 0938  TempSrc:   PainSc: [place]

## 2018-07-17 NOTE — Evaluation (Signed)
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 Pt admitted 07/16/18 for L 4-5 decompression and PLIF.   Clinical Impression   Limited evaluation due to back pain. Pt educated in back precautions related to ADL and IADL and use of AE. Pt likely to rely on her boyfriend of 17 years to assist with LB ADL until she can complete independently. Will follow to reinforce back precautions and facilitate independence.    Follow Up Recommendations  No OT follow up    Equipment Recommendations  3 in 1 bedside commode(rolling walker)    Recommendations for Other Services       Precautions / Restrictions Precautions Precautions: Fall;Back Precaution Booklet Issued: Yes (comment) Precaution Comments: educated in BLT Required Braces or Orthoses: Spinal Brace Spinal Brace: Lumbar corset;Applied in sitting position(can walk to bathroom without brace) Restrictions Weight Bearing Restrictions: No      Mobility Bed Mobility Overal bed mobility: Needs Assistance Bed Mobility: Rolling;Sidelying to Sit;Sit to Sidelying Rolling: Supervision Sidelying to sit: Min assist     Sit to sidelying: Min assist General bed mobility comments: pulled up on therapist's hand to raise trunk, assisted LEs back into bed, cues for log roll technique and positioning of pillow between knees in sidelying and under knees in supine  Transfers                 General transfer comment: pt declined OOB due to pain    Balance Overall balance assessment: Needs assistance   Sitting balance-Leahy Scale: Fair                                     ADL either performed or assessed with clinical judgement   ADL Overall  ADL's : Needs assistance/impaired Eating/Feeding: Independent;Bed level     Grooming Details (indicate cue type and reason): educated pt in two cup method for tooth brushing and to use washcloth to wash face at sink   Upper Body Bathing Details (indicate cue type and reason): educated in use of long handled bath sponge to reach back   Lower Body Bathing Details (indicate cue type and reason): educated in use of long handled bath sponge and reacher for washing and drying feet Upper Body Dressing : Set up   Lower Body Dressing: Minimal assistance;Sitting/lateral leans Lower Body Dressing Details (indicate cue type and reason): educated in use of sock aid, long handled shoe horn, elastic laces and reacher       Toileting - Clothing Manipulation Details (indicate cue type and reason): instructed to avoid twisting with pericare and compensatory strategies             Vision Baseline Vision/History: Wears glasses Wears Glasses: Reading only Patient Visual Report: No change from baseline       Perception     Praxis      Pertinent Vitals/Pain Pain Assessment: Faces Faces Pain Scale: Hurts whole lot Pain Location: back Pain Descriptors / Indicators: Aching Pain Intervention(s): Monitored during session;Premedicated before session;Repositioned     Hand Dominance Right   Extremity/Trunk Assessment Upper Extremity Assessment Upper Extremity Assessment: Overall WFL for tasks assessed   Lower Extremity Assessment  Lower Extremity Assessment: Defer to PT evaluation   Cervical / Trunk Assessment Cervical / Trunk Assessment: Other exceptions Cervical / Trunk Exceptions: s/p surgery   Communication Communication Communication: No difficulties   Cognition Arousal/Alertness: Awake/alert Behavior During Therapy: WFL for tasks assessed/performed Overall Cognitive Status: Within Functional Limits for tasks assessed                                     General  Comments       Exercises     Shoulder Instructions      Home Living Family/patient expects to be discharged to:: Private residence Living Arrangements: Spouse/significant other([name] [name]) Available Help at Discharge: Friend(s);Available 24 hours/day(boyfriend) Type of Home: House             Bathroom Shower/Tub: Other (comment)(walk in tub)   Bathroom Toilet: Standard     Home Equipment: Walker - standard;Shower seat - built in;Hand held shower head;Grab bars - tub/shower          Prior Functioning/Environment Level of Independence: Needs assistance    ADL's / Homemaking Assistance Needed: boyfriend [name] [name] has been assisting with IADL since December.            OT Problem List: Impaired balance (sitting and/or standing);Decreased activity tolerance;Decreased knowledge of use of DME or AE;Decreased knowledge of precautions;Pain      OT Treatment/Interventions: Self-care/ADL training;DME and/or AE instruction;Therapeutic activities;Balance training;Patient/family education    OT Goals(Current goals can be found in the care plan section) Acute Rehab OT Goals Patient Stated Goal: to return home with assist of her boyfriend OT Goal Formulation: With patient Time For Goal Achievement: 07/24/18 Potential to Achieve Goals: Good ADL Goals Pt Will Perform Grooming: with modified independence;standing Pt Will Perform Lower Body Dressing: with supervision;sit to/from stand;with caregiver independent in assisting Pt Will Transfer to Toilet: with modified independence;ambulating;bedside commode(over toilet) Pt Will Perform Toileting - Clothing Manipulation and hygiene: with modified independence;sit to/from stand Additional ADL Goal #1: Pt will adhere to back precautions during ADL and mobility independently. Additional ADL Goal #2: Pt and caregiver will don and doff back brace independently.  OT Frequency: Min 2X/week   Barriers to D/C:            Co-evaluation               AM-PAC OT "6 Clicks" Daily Activity     Outcome Measure Help from another person eating meals?: None Help from another person taking care of personal grooming?: A Little Help from another person toileting, which includes using toliet, bedpan, or urinal?: A Little Help from another person bathing (including washing, rinsing, drying)?: A Little Help from another person to put on and taking off regular upper body clothing?: A Little Help from another person to put on and taking off regular lower body clothing?: A Little 6 Click Score: 19   End of Session    Activity Tolerance: Patient limited by pain Patient left: in bed;with call bell/phone within reach;with family/visitor present  OT Visit Diagnosis: Pain                Time: 6967-8938 OT Time Calculation (min): 24 min Charges:  OT General Charges $OT Visit: 1 Visit OT Evaluation $OT Eval Moderate Complexity: 1 Mod OT Treatments $Self Care/Home Management : 8-22 mins  Nestor Lewandowsky, OTR/L Acute Rehabilitation Services Pager: [phone] Office: [phone] Malka So 07/17/2018, 10:25 AM

## 2018-07-18 NOTE — Progress Notes (Signed)
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 Pt admitted 07/16/18 for L 4-5 decompression and PLIF.   OT comments  Patient is s/p  L 4-5 decompression and PLIF surgery resulting in the deficits listed below (see OT Problem List). The patient was limited to pain in this session. The patient was educated about positioning and relaxation. The patient was abel to complete bed mobility with supervision with cues for hand placement and following precautions. Patient was re educated on AE to use on the return to home. Patient reported she will have her boyfriend to assist 24/7. The patient was able to don/doff brace. Patient will benefit from skilled OT to increase their safety and independence with ADL and functional mobility for ADL (while adhering to their precautions) to facilitate discharge to venue listed below.     Follow Up Recommendations  No OT follow up;Supervision/Assistance - 24 hour    Equipment Recommendations  3 in 1 bedside commode    Recommendations for Other Services      Precautions / Restrictions Precautions Precautions: Fall;Back Precaution Booklet Issued: Yes (comment) Precaution Comments: Reviwed precautions with patient in regards to ADLS Required Braces or Orthoses: Spinal Brace Spinal Brace: Lumbar corset;Applied in sitting position Restrictions Weight Bearing Restrictions: No       Mobility Bed Mobility Overal bed mobility: Needs Assistance Bed Mobility: Rolling;Sidelying to Sit;Sit to Sidelying Rolling: Supervision Sidelying to sit: Supervision     Sit to sidelying: Supervision General bed mobility comments: Supervision for safety  Transfers Overall transfer level: Needs  assistance Equipment used: Rolling walker (2 wheeled) Transfers: Sit to/from Stand Sit to Stand: Supervision         General transfer comment: Vcs for hand placement    Balance Overall balance assessment: Needs assistance   Sitting balance-Leahy Scale: Fair     Standing balance support: Bilateral upper extremity supported Standing balance-Leahy Scale: Poor Standing balance comment: reliance on RW for support                           ADL either performed or assessed with clinical judgement   ADL Overall ADL's : Needs assistance/impaired Eating/Feeding: Independent;Bed level   Grooming: Wash/dry hands;Sitting;Cueing for safety;Cueing for sequencing;Cueing for compensatory techniques Grooming Details (indicate cue type and reason): reeducated on psotioning when compelting gromming in standing Upper Body Bathing: Supervision/ safety;Cueing for safety;Cueing for sequencing;Cueing for compensatory techniques;Sitting   Lower Body Bathing: Minimal assistance;With adaptive equipment;Cueing for safety;Cueing for sequencing;Cueing for compensatory techniques;Cueing for back precautions;Sit to/from stand   Upper Body Dressing : Set up;Cueing for safety;Cueing for sequencing;Cueing for compensatory techniques   Lower Body Dressing: Minimal assistance;Cueing for safety;Cueing for sequencing;Cueing for compensatory techniques;Cueing for back precautions;Sit to/from stand                 General ADL Comments: limited due to pain in session     Vision   Vision Assessment?: No apparent visual deficits   Perception     Praxis      Cognition Arousal/Alertness: Awake/alert Behavior During Therapy: WFL for tasks assessed/performed Overall Cognitive Status: Within Functional Limits for tasks assessed  Exercises     Shoulder Instructions       General Comments      Pertinent Vitals/ Pain       Pain  Assessment: 0-10 Pain Score: 8  Faces Pain Scale: Hurts even more Pain Location: back Pain Descriptors / Indicators: Aching Pain Intervention(s): Monitored during session;Relaxation;Ice applied;Repositioned;Limited activity within patient's tolerance  Home Living                                          Prior Functioning/Environment              Frequency  Min 2X/week        Progress Toward Goals  OT Goals(current goals can now be found in the care plan section)  Progress towards OT goals: Progressing toward goals  Acute Rehab OT Goals Patient Stated Goal: to return home with assist of her boyfriend OT Goal Formulation: With patient Time For Goal Achievement: 07/24/18 Potential to Achieve Goals: Good ADL Goals Pt Will Perform Grooming: with modified independence;standing Pt Will Perform Lower Body Dressing: with supervision;sit to/from stand;with caregiver independent in assisting Pt Will Transfer to Toilet: with modified independence;ambulating;bedside commode Pt Will Perform Toileting - Clothing Manipulation and hygiene: with modified independence;sit to/from stand Additional ADL Goal #1: Pt will adhere to back precautions during ADL and mobility independently. Additional ADL Goal #2: Pt and caregiver will don and doff back brace independently.  Plan Discharge plan remains appropriate    Co-evaluation                 AM-PAC OT "6 Clicks" Daily Activity     Outcome Measure   Help from another person eating meals?: None Help from another person taking care of personal grooming?: A Little Help from another person toileting, which includes using toliet, bedpan, or urinal?: A Little Help from another person bathing (including washing, rinsing, drying)?: A Little Help from another person to put on and taking off regular upper body clothing?: A Little Help from another person to put on and taking off regular lower body clothing?: A Little 6  Click Score: 19    End of Session Equipment Utilized During Treatment: Gait belt;Back brace  OT Visit Diagnosis: Pain Pain - part of body: (back)   Activity Tolerance Patient limited by pain   Patient Left in bed;with call bell/phone within reach   Nurse Communication Other (comment)(pain)        Time: 2446-9507 OT Time Calculation (min): 23 min  Charges: OT General Charges $OT Visit: 1 Visit OT Treatments $Self Care/Home Management : 23-37 mins  Joeseph Amor OTR/L  [place]  [phone] office number [phone] pager number    Joeseph Amor 07/18/2018, 9:18 AM

## 2018-07-18 NOTE — Discharge Instructions (Addendum)
Wound Care Leave incision open to air. You may shower. Do not scrub directly on incision.  Do not put any creams, lotions, or ointments on incision.  Activity  Walk each and every day, increasing distance each day. No lifting greater than 5 lbs.  Avoid bending, arching, and twisting. No driving for 2 weeks; may ride as a passenger locally. If provided with back brace, wear when out of bed.  It is not necessary to wear in bed.  Diet Resume your normal diet.  Return to Work Will be discussed at you follow up appointment. Call Your Doctor If Any of These Occur Redness, drainage, or swelling at the wound.  Temperature greater than 101 degrees. Severe pain not relieved by pain medication. Incision starts to come apart. Follow Up Appt Call today for appointment in 2-4 weeks ([phone]) or for problems.  If you have any hardware placed in your spine, you will need an x-ray before your appointment.

## 2018-07-18 NOTE — Discharge Summary (Signed)
Physician Discharge Summary  Patient ID: [name] [name] MRN: [id number] DOB/AGE: [birth year] 61 y.o.  Admit date: 07/16/2018 Discharge date: 07/18/2018  Admission Diagnoses:  L4-5 spondylolisthesis, facet arthropathy, degenerative disc disease, spinal stenosis compressing both the L4 and the L5 nerve roots; lumbago; lumbar radiculopathy; neurogenic claudication  Discharge Diagnoses:  L4-5 spondylolisthesis, facet arthropathy, degenerative disc disease, spinal stenosis compressing both the L4 and the L5 nerve roots; lumbago; lumbar radiculopathy; neurogenic claudication Active Problems:   Spondylolisthesis of lumbar region   Discharged Condition: good  Hospital Course: Patient was admitted by Dr. Arnoldo Morale who performed an L4-5 decompression and arthrodesis.  Patient has made good progress postoperatively.  Her dressing is clean and dry.  She is being discharged home with instructions regarding wound care and activities.  She is to call Dr. Arnoldo Morale office in 2 days to schedule a follow-up appointment.  Discharge Exam: Blood pressure 104/69, pulse 76, temperature 98.6 F (37 C), temperature source Oral, resp. rate 16, height 5' (1.524 m), weight 45.8 kg, SpO2 97 %.  Disposition: Discharge disposition: 01-Home or Self Care       Discharge Instructions    Discharge wound care:   Complete by:  As directed    Remove the dressing on March 15 and leave the wound open to air.  When showering have the wound covered for the first week and a half.  Water and soapy water should run over the incision area. Do not wash directly on the incision for 2 weeks. Remove the Steri-Strips after a week and a half.   Driving Restrictions   Complete by:  As directed    No driving for 2 weeks. May ride in the car locally now. May begin to drive locally in 2 weeks.   Other Restrictions   Complete by:  As directed    Walk gradually increasing distances out in the fresh air at least twice a day. Walking additional  6 times inside the house, gradually increasing distances, daily. No bending, lifting, or twisting. Perform activities between shoulder and waist height (that is at counter height when standing or table height when sitting).     Allergies as of 07/18/2018      Reactions   Naproxen Shortness Of Breath   Chest pain   Eggs Or Egg-derived Products Diarrhea   abd pain   Lovastatin Other (See Comments)   Chest pain   Toradol [ketorolac Tromethamine] Other (See Comments)   Headache. Non-effective   Tramadol Other (See Comments)   Headache. Non-effective.   Nexium [esomeprazole Magnesium] Diarrhea   Abdominal pain      Medication List    TAKE these medications   amLODipine 5 MG tablet Commonly known as:  NORVASC TAKE 1 TABLET BY MOUTH ONCE DAILY   Aspercreme Lidocaine 4 % Liqd Generic drug:  Lidocaine HCl Apply 1 application topically 3 (three) times daily as needed (pain).   benzonatate 200 MG capsule Commonly known as:  TESSALON Take 200 mg by mouth 3 (three) times daily as needed for cough.   Biofreeze 10 % Aero Generic drug:  Menthol Apply 1 application topically 3 (three) times daily as needed (pain).   calcium elemental as carbonate 400 MG chewable tablet Commonly known as:  BARIATRIC TUMS ULTRA Chew 1,000 mg by mouth 2 (two) times daily.   calcium carbonate 500 MG chewable tablet Commonly known as:  TUMS - dosed in mg elemental calcium Chew 1 tablet (200 mg of elemental calcium total) by mouth 3 (three) times daily  with meals.   cholestyramine 4 GM/DOSE powder Commonly known as:  QUESTRAN DISSOLVE AND TAKE 2 GRAMS BY MOUTH TWICE DAILY WITH A MEAL- DO NOT TAKE WITHIN 2 HOURS OF OTHER MEDICATIONS What changed:  See the new instructions.   cyclobenzaprine 10 MG tablet Commonly known as:  FLEXERIL Take 10 mg by mouth daily as needed for muscle spasms.   denosumab 60 MG/ML Sosy injection Commonly known as:  PROLIA Inject 60 mg into the skin every 6 (six) months.    dexlansoprazole 60 MG capsule Commonly known as:  Dexilant 1 PO EVERY MORNING WITH BREAKFAST. What changed:    how much to take  how to take this  when to take this  additional instructions   diazepam 5 MG tablet Commonly known as:  VALIUM TAKE 1 TABLET BY MOUTH EVERY 12 HOURS AS NEEDED FOR ANXIETY What changed:    reasons to take this  additional instructions   fluticasone 50 MCG/ACT nasal spray Commonly known as:  FLONASE USE TWO SPRAY(S) IN EACH NOSTRIL ONCE DAILY AS NEEDED FOR ALLERGY OR RHINITIS What changed:  See the new instructions.   gabapentin 400 MG capsule Commonly known as:  NEURONTIN Take 400 mg by mouth 3 (three) times daily.   nystatin 100000 UNIT/ML suspension Commonly known as:  MYCOSTATIN Take 5 mLs (500,000 Units total) by mouth daily as needed (thrush). TAKE  5 ML BY MOUTH 4 TIMES DAILY AS NEEDED   oxyCODONE-acetaminophen 10-325 MG tablet Commonly known as:  PERCOCET Take 1 tablet by mouth 4 (four) times daily as needed for pain.   PHILLIPS COLON HEALTH PO Take 1 capsule by mouth daily.   POTASSIUM PO Take 50 mg by mouth daily.   Symbicort 80-4.5 MCG/ACT inhaler Generic drug:  budesonide-formoterol INHALE 2 PUFFS BY MOUTH TWICE DAILY What changed:    when to take this  reasons to take this   triamcinolone ointment 0.5 % Commonly known as:  KENALOG Apply 1 application topically 2 (two) times daily.   Turmeric 500 MG Caps Take 500 mg by mouth daily.   Ventolin HFA 108 (90 Base) MCG/ACT inhaler Generic drug:  albuterol INHALE 2 PUFFS BY MOUTH EVERY 4 HOURS AS NEEDED FOR WHEEZING What changed:  See the new instructions.   Vitamin D (Ergocalciferol) 1.25 MG (50000 UT) Caps capsule Commonly known as:  DRISDOL Take 1 capsule (50,000 Units total) by mouth every 7 (seven) days.   Vitamin D3 125 MCG (5000 UT) Caps Take 1 capsule (5,000 Units total) by mouth daily.            Durable Medical Equipment  (From admission, onward)          Start     Ordered   07/17/18 1015  For home use only DME 3 n 1  Once     07/17/18 1014   07/17/18 1014  For home use only DME Walker youth  Once    Question:  Patient needs a walker to treat with the following condition  Answer:  S/P lumbar spinal fusion   07/17/18 1014           Discharge Care Instructions  (From admission, onward)         Start     Ordered   07/18/18 0000  Discharge wound care:    Comments:  Remove the dressing on March 15 and leave the wound open to air.  When showering have the wound covered for the first week and a half.  Water  and soapy water should run over the incision area. Do not wash directly on the incision for 2 weeks. Remove the Steri-Strips after a week and a half.   07/18/18 0850         Follow-up Information    Newman Pies, MD Follow up.   Specialty:  Neurosurgery Contact information: [address] [place] [address] [phone]           Signed: Hosie Spangle 07/18/2018, 8:50 AM

## 2018-07-18 NOTE — Progress Notes (Signed)
Patient is discharged from room 3C06 at this time. Alert and in stable condition. IV site d/c'[d and instructions read to patient and spouse with understanding verbalized. Left unit via wheelchair with all belongings at side.

## 2018-07-18 NOTE — Progress Notes (Signed)
Physical Therapy Treatment Patient Details Name: [name] [name] MRN: [id number] DOB: 04-15-1956 Today's Date: 07/18/2018    History of Present Illness Pt is a 63 year old woman with hx of previous back sx, COPD, asthma, sleep apnea, HTN, arthritis, anxiety, depression and "nerve damage in her foot" who is a current smoker. Pt admitted 07/16/18 for L 4-5 decompression and PLIF.    PT Comments    Patient seen for mobility progression s/p spinal surgery. Mobilizing well despite pain. Educated patient on precautions, mobility expectations, safety and car transfers.     Follow Up Recommendations  Supervision for mobility/OOB     Equipment Recommendations  3in1 (PT);Rolling walker with 5" wheels(youth sized walker)    Recommendations for Other Services       Precautions / Restrictions Precautions Precautions: Fall;Back Precaution Booklet Issued: Yes (comment) Precaution Comments: Reviewed handout in detail. Pt was cued for precautions during functional mobility.  Required Braces or Orthoses: Spinal Brace Spinal Brace: Lumbar corset;Applied in sitting position(can walk to bathroom without brace) Restrictions Weight Bearing Restrictions: No    Mobility  Bed Mobility Overal bed mobility: Needs Assistance Bed Mobility: Rolling;Sidelying to Sit;Sit to Sidelying Rolling: Supervision         General bed mobility comments: Supervision for safety  Transfers Overall transfer level: Needs assistance Equipment used: Rolling walker (2 wheeled) Transfers: Sit to/from Stand Sit to Stand: Supervision         General transfer comment: Vcs for hand placement  Ambulation/Gait Ambulation/Gait assistance: Supervision Gait Distance (Feet): 310 Feet Assistive device: Rolling walker (2 wheeled) Gait Pattern/deviations: Step-through pattern;Decreased stride length;Narrow base of support Gait velocity: decreased Gait velocity interpretation: <1.8 ft/sec, indicate of risk for recurrent  falls General Gait Details: steady with ambulation despite pain   Stairs Stairs: Yes Stairs assistance: Supervision Stair Management: One rail Left Number of Stairs: 3 General stair comments: VCs for sequencing   Wheelchair Mobility    Modified Rankin (Stroke Patients Only)       Balance Overall balance assessment: Needs assistance   Sitting balance-Leahy Scale: Fair     Standing balance support: Bilateral upper extremity supported Standing balance-Leahy Scale: Poor Standing balance comment: reliance on RW for support                            Cognition Arousal/Alertness: Awake/alert Behavior During Therapy: WFL for tasks assessed/performed Overall Cognitive Status: Within Functional Limits for tasks assessed                                        Exercises      General Comments        Pertinent Vitals/Pain Pain Assessment: Faces Faces Pain Scale: Hurts even more Pain Location: back Pain Descriptors / Indicators: Aching Pain Intervention(s): Monitored during session    Home Living                      Prior Function            PT Goals (current goals can now be found in the care plan section) Acute Rehab PT Goals Patient Stated Goal: to return home with assist of her boyfriend PT Goal Formulation: With patient Time For Goal Achievement: 07/24/18 Potential to Achieve Goals: Good Progress towards PT goals: Progressing toward goals    Frequency    Min 5X/week  PT Plan Current plan remains appropriate    Co-evaluation              AM-PAC PT "6 Clicks" Mobility   Outcome Measure  Help needed turning from your back to your side while in a flat bed without using bedrails?: None Help needed moving from lying on your back to sitting on the side of a flat bed without using bedrails?: A Little Help needed moving to and from a bed to a chair (including a wheelchair)?: A Little Help needed standing up  from a chair using your arms (e.g., wheelchair or bedside chair)?: A Little Help needed to walk in hospital room?: A Little Help needed climbing 3-5 steps with a railing? : A Lot 6 Click Score: 18    End of Session Equipment Utilized During Treatment: Back brace Activity Tolerance: Patient limited by pain Patient left: in bed;with call bell/phone within reach(sitting EOB) Nurse Communication: Mobility status PT Visit Diagnosis: Unsteadiness on feet (R26.81);Pain;Other symptoms and signs involving the nervous system (R29.898) Pain - part of body: (back, hips, thighs)     Time: 2130-8657 PT Time Calculation (min) (ACUTE ONLY): 20 min  Charges:  $Gait Training: 8-22 mins                     Alben Deeds, PT DPT  Board Certified Neurologic Specialist [place] Pager [phone] Office [employer] 07/18/2018, 8:02 AM

## 2018-07-20 ENCOUNTER — Other Ambulatory Visit: Payer: Self-pay | Admitting: Family Medicine

## 2018-07-20 ENCOUNTER — Encounter: Payer: Self-pay | Admitting: Gastroenterology

## 2018-07-22 MED FILL — Heparin Sodium (Porcine) Inj 1000 Unit/ML: INTRAMUSCULAR | Qty: 30 | Status: AC

## 2018-07-22 MED FILL — Sodium Chloride IV Soln 0.9%: INTRAVENOUS | Qty: 1000 | Status: AC

## 2018-08-04 DIAGNOSIS — M4316 Spondylolisthesis, lumbar region: Secondary | ICD-10-CM | POA: Diagnosis not present

## 2018-08-04 DIAGNOSIS — M47816 Spondylosis without myelopathy or radiculopathy, lumbar region: Secondary | ICD-10-CM | POA: Diagnosis not present

## 2018-08-18 DIAGNOSIS — M545 Low back pain: Secondary | ICD-10-CM | POA: Diagnosis not present

## 2018-08-18 DIAGNOSIS — M542 Cervicalgia: Secondary | ICD-10-CM | POA: Diagnosis not present

## 2018-08-18 DIAGNOSIS — Z79891 Long term (current) use of opiate analgesic: Secondary | ICD-10-CM | POA: Diagnosis not present

## 2018-08-18 DIAGNOSIS — G894 Chronic pain syndrome: Secondary | ICD-10-CM | POA: Diagnosis not present

## 2018-08-18 DIAGNOSIS — R1084 Generalized abdominal pain: Secondary | ICD-10-CM | POA: Diagnosis not present

## 2018-09-01 ENCOUNTER — Ambulatory Visit (INDEPENDENT_AMBULATORY_CARE_PROVIDER_SITE_OTHER): Payer: Medicare Other | Admitting: Family Medicine

## 2018-09-01 ENCOUNTER — Encounter: Payer: Self-pay | Admitting: Family Medicine

## 2018-09-01 ENCOUNTER — Other Ambulatory Visit: Payer: Self-pay

## 2018-09-01 DIAGNOSIS — M810 Age-related osteoporosis without current pathological fracture: Secondary | ICD-10-CM | POA: Diagnosis not present

## 2018-09-01 DIAGNOSIS — J302 Other seasonal allergic rhinitis: Secondary | ICD-10-CM

## 2018-09-01 DIAGNOSIS — I10 Essential (primary) hypertension: Secondary | ICD-10-CM | POA: Diagnosis not present

## 2018-09-01 DIAGNOSIS — J410 Simple chronic bronchitis: Secondary | ICD-10-CM

## 2018-09-01 MED ORDER — FLUTICASONE PROPIONATE 50 MCG/ACT NA SUSP: NASAL | 2 refills | Status: DC

## 2018-09-01 MED ORDER — LORATADINE 10 MG PO TABS: 10.0000 mg | ORAL_TABLET | Freq: Every day | ORAL | 3 refills | Status: DC

## 2018-09-01 MED ORDER — GABAPENTIN 400 MG PO CAPS: 400.0000 mg | ORAL_CAPSULE | Freq: Four times a day (QID) | ORAL | Status: DC

## 2018-09-01 NOTE — Progress Notes (Signed)
Virtual Visit via Telephone Note  I connected with [name] [name] on 09/01/18 at 12:50pm  by telephone and verified that I am speaking with the correct person using two identifiers.   Pt location: at home    Physician location: in office, [place], [name] MD   I discussed the limitations, risks, security and privacy concerns of performing an evaluation and management service by telephone and the availability of in person appointments. I also discussed with the patient that there may be a patient responsible charge related to this service. The patient expressed understanding and agreed to proceed.   History of Present Illness:  Pt had she had decompression and arthrodesis of lumbar spine L4-L5 on March 12th. She has had some improvement in pain, but still has good and bad days. She has been doing home PT exercises.  She did start doing them twice a day, instead twice a week, she started having increased pain. She was seen by Dr. Arnoldo Morale after that told everything looked okay and her PT scheduled with tweaked.stillusing a cane   Chronic pain management- followed by [facility], she is on Percocet qid , she is also on new muscle relaxer, states she had difficulty breathing with flexeril  She is also on gabapentin four times a day   COPD- using symbicort for maintenance, pollen does make her cough, she gets itchy eyes, itchy throat, occ cough with prodctuve yellow phlegm  She does not have the flonase   HTN- taking norvasc 23m, had 1 low BP    Observations/Objective: Unable to observe, normal WOB over phone, no acute distress noted   Assessment and Plan: Seasonal allergies- restart Flonase, add claritin  COPD- no recent exacerbations- using symbicort, rare use of albuterol HTN-well controlled, except when she took narcotic and valium Osteoporosis- getting prolia in June    Follow Up Instructions:     I discussed the assessment and treatment plan with  the patient. The patient was provided an opportunity to ask questions and all were answered. The patient agreed with the plan and demonstrated an understanding of the instructions.   The patient was advised to call back or seek an in-person evaluation if the symptoms worsen or if the condition fails to improve as anticipated.  I provided 281mutes of non-face-to-face time during this encounter. End Time:1:12pm  KaVic BlackbirdMD

## 2018-09-07 ENCOUNTER — Other Ambulatory Visit: Payer: Self-pay | Admitting: Family Medicine

## 2018-10-01 ENCOUNTER — Ambulatory Visit (INDEPENDENT_AMBULATORY_CARE_PROVIDER_SITE_OTHER): Payer: Medicare Other | Admitting: Gastroenterology

## 2018-10-01 ENCOUNTER — Other Ambulatory Visit: Payer: Self-pay

## 2018-10-01 ENCOUNTER — Encounter: Payer: Self-pay | Admitting: Gastroenterology

## 2018-10-01 DIAGNOSIS — R1319 Other dysphagia: Secondary | ICD-10-CM

## 2018-10-01 DIAGNOSIS — R1310 Dysphagia, unspecified: Secondary | ICD-10-CM

## 2018-10-01 DIAGNOSIS — K219 Gastro-esophageal reflux disease without esophagitis: Secondary | ICD-10-CM

## 2018-10-01 DIAGNOSIS — K50019 Crohn's disease of small intestine with unspecified complications: Secondary | ICD-10-CM

## 2018-10-01 MED ORDER — DEXLANSOPRAZOLE 60 MG PO CPDR: DELAYED_RELEASE_CAPSULE | ORAL | 11 refills | Status: DC

## 2018-10-01 NOTE — Assessment & Plan Note (Signed)
SYMPTOMS FAIRLY WELL CONTROLLED.  CONTINUE DEXILANT CONTINUE TO MONITOR SYMPTOMS. FOLLOW UP IN 6 MOS.

## 2018-10-01 NOTE — Progress Notes (Signed)
Subjective:    Patient ID: [name] [name], female    DOB: 1956-04-09, 63 y.o.   MRN: [id number]    Primary Care Physician:  Alycia Rossetti, MD  Primary GI:  Barney Drain, MD   Patient Location: home   Provider Location: [place] office   Reason for Visit: DYSPHAGIA, CROHN'S DISEASE   Persons present on the virtual encounter, with roles: patient, myself (provider), MARTINA BOOTH CMA (update meds/allergies)   Total time (minutes) spent on medical discussion:  25 MINUTES   Due to COVID-19, visit was VIA TELEPHONE VISIT DUE TO COVID 19. VISIT IS CONDUCTED VIRTUALLY AND WAS REQUESTED BY PATIENT.   Virtual Visit via TELEPHONE   I connected with Solangel Twichell and verified that I am speaking with the correct person using two identifiers.   I discussed the limitations, risks, security and privacy concerns of performing an evaluation and management service by telephone/video and the availability of in person appointments. I also discussed with the patient that there may be a patient responsible charge related to this service. The patient expressed understanding and agreed to proceed.  HPI Had back surgery MAR 12. LAST NIGHT Ate bacon and abdominal cramps and diarrhea. GETTING SORES ON LEG. LOOK LIKE SHINGLES. THEY ITCH AND ONE IN BACK OF HEAD AND ONE ON her thigh, two on lower LEG, and one in her head. BENADRYL ON IT AND IT'S BETTER. CIRCLE AROUND IT AND IT'S RED. NOT PAINFUL. SWALLOWING: HARD TIME BUT DOES TINY BITES AND DOES WELL. TROUBLE SWALLOWING PILLS BUT FOOD IS FINE. NO KNOWN BED BUGS.  PT DENIES FEVER, CHILLS, HEMATOCHEZIA, HEMATEMESIS, nausea, vomiting, melena, diarrhea, CHEST PAIN, SHORTNESS OF BREATH,  CHANGE IN BOWEL IN HABITS, constipation, problems swallowing, OR heartburn or indigestion.  Past Medical History:  Diagnosis Date  . Allergic rhinitis   . Anastomotic ulcer JAN 2009  . Anxiety   . Arthritis   . Asthma   . BMI (body mass index) 20.0-29.9 2009 121 lbs  .  Concussion   . COPD with asthma ([place]) MAR 2011 PFTS  . Crohn disease ([place])   . Crohn disease ([place])   . CTS (carpal tunnel syndrome)   . Diarrhea MULITIFACTORIAL   IBS, LACTOSE INTOLERANCE, SBBO, BILE-SALT  . Elevated liver enzymes 2009: BMI 24 ?Etoh ALT 94, AST 51,  NEG ANA, qIGs& ASMA   MAY 2012 AST 52 ALT 38  . Esophageal stricture 2009  . GERD (gastroesophageal reflux disease)   . Hemorrhoid   . Hyperlipemia   . Hypertension   . Inflammatory bowel disease 2006 CD   SURGICAL REMISSION  . LBP (low back pain)   . Mental disorder   . Migraine   . Osteoporosis   . PONV (postoperative nausea and vomiting)   . Shortness of breath    exertion and humidity  . Shoulder pain    right  . Sleep apnea    Stop [name]    Past Surgical History:  Procedure Laterality Date  . BACK SURGERY    . BILATERAL SALPINGOOPHORECTOMY    . BIOPSY  12/24/2011   Procedure: BIOPSY;  Surgeon: Danie Binder, MD;  Location: [facility];  Service: Endoscopy;;  Gastric Biopsies  . BIOPSY N/A 06/30/2012   Procedure: BIOPSY;  Surgeon: Danie Binder, MD;  Location: [facility];  Service: Endoscopy;  Laterality: N/A;  Gastric and Esophageal Biopsies  . CARPAL TUNNEL RELEASE  LEFT  . CATARACT EXTRACTION W/PHACO Right 07/30/2016   Procedure: CATARACT  EXTRACTION PHACO AND INTRAOCULAR LENS PLACEMENT (IOC);  Surgeon: Rutherford Guys, MD;  Location: [facility];  Service: Ophthalmology;  Laterality: Right;  CDE: 5.45  . CATARACT EXTRACTION W/PHACO Left 08/13/2016   Procedure: CATARACT EXTRACTION PHACO AND INTRAOCULAR LENS PLACEMENT (IOC);  Surgeon: Rutherford Guys, MD;  Location: [facility];  Service: Ophthalmology;  Laterality: Left;  CDE: 5.59  . COLON SURGERY    . COLONOSCOPY  JAN 2009 DIARRHEA, ABD PAIN, BRBPR   ANASTOMOTIC ULCER(3MM), IH KC:LEXNTZ ULCER, NL COLON bX  . COLONOSCOPY WITH PROPOFOL N/A 04/21/2018   Procedure: COLONOSCOPY WITH PROPOFOL;  Surgeon: Danie Binder, MD;  [facility];  Service:  Endoscopy;  Laterality: N/A;  1:15pm  . DILATION AND CURETTAGE OF UTERUS    . ESOPHAGEAL DILATION  12/24/2011   SLF: A stricture was found in the distal esophagus/ Moderate gastritis/ MILD Duodenitis  . ESOPHAGOGASTRODUODENOSCOPY  02/07/09   mild gastritis  . ESOPHAGOGASTRODUODENOSCOPY (EGD) WITH PROPOFOL N/A 06/30/2012   SLF: 1. No definite stricture appreciated 2. small hiatal hernia 3. Gastritis  . ESOPHAGOGASTRODUODENOSCOPY (EGD) WITH PROPOFOL N/A 02/20/2016   Procedure: ESOPHAGOGASTRODUODENOSCOPY (EGD) WITH PROPOFOL;  Surgeon: Danie Binder, MD;  [facility];  Service: Endoscopy;  Laterality: N/A;  930  . EYE SURGERY    . gum flap Bilateral   . HEMICOLECTOMY  RIGHT 2006  . HERNIA REPAIR    . LUMBAR DISC SURGERY    . POLYPECTOMY  04/21/2018   Procedure: POLYPECTOMY;  Surgeon: Danie Binder, MD;  [facility];  Service: Endoscopy;;  (colon)  . SAVORY DILATION N/A 06/30/2012   Procedure: SAVORY DILATION;  Surgeon: Danie Binder, MD;  Location: [facility];  Service: Endoscopy;  Laterality: N/A;  12.68m , 169m 1570m. SAVORY DILATION N/A 02/20/2016   Procedure: SAVORY DILATION;  Surgeon: SanDanie BinderD;  [facility];  Service: Endoscopy;  Laterality: N/A;  . UPPER GASTROINTESTINAL ENDOSCOPY  JAN 2009 ABD PAIN   GASTRITIS, ESO RING  . UPPER GASTROINTESTINAL ENDOSCOPY  OCT 2010 ABD PAIN, DYSPHAGIA   DIL 15MM, GASTRITIS, NL DUODENUM  . UPPER GASTROINTESTINAL ENDOSCOPY  OCT 2010 DYSPHAGIA   DIL 17 MM, GASTRITIS, NL DUODENUM  . vocal cord surgery  Sep 20, 2010   precancerous areas removed  . wisdom tooth extraction Right    pin placement due to broken jaw   Allergies  Allergen Reactions  . Naproxen Shortness Of Breath    Chest pain  . Eggs Or Egg-Derived Products Diarrhea    abd pain  . Lovastatin Other (See Comments)    Chest pain  . Toradol [Ketorolac Tromethamine] Other (See Comments)    Headache. Non-effective  . Tramadol Other (See  Comments)    Headache. Non-effective.  . NMarland Kitchenxium [Esomeprazole Magnesium] Diarrhea    Abdominal pain   Current Outpatient Medications  Medication Sig    . amLODipine (NORVASC) 5 MG tablet Take 1 tablet by mouth once daily    . benzonatate (TESSALON) 200 MG capsule Take 200 mg by mouth 3 (three) times daily as needed for cough.    . Calcium Carb-Cholecalciferol (CALCIUM 600 + D PO) Take by mouth daily.    . calcium carbonate (TUMS - DOSED IN MG ELEMENTAL CALCIUM) 500 MG chewable tablet Chew 1 tablet (200 mg of elemental calcium total) by mouth 3 (three) times daily with meals. (Patient taking differently: Chew 1 tablet by mouth as needed. )    . cholestyramine (QUESTRAN) 4 GM/DOSE  powder DISSOLVE AND TAKE 2 GRAMS BY MOUTH TWICE DAILY WITH A MEAL- DO NOT TAKE WITHIN 2 HOURS OF OTHER MEDICATIONS (Patient taking differently: Take 4 g by mouth daily. )    . denosumab (PROLIA) 60 MG/ML SOSY injection Inject 60 mg into the skin every 6 (six) months.    . dexlansoprazole (DEXILANT) 60 MG capsule 1 PO EVERY MORNING WITH BREAKFAST. (Patient taking differently: Take 60 mg by mouth daily. )    . fluticasone (FLONASE) 50 MCG/ACT nasal spray USE TWO SPRAY(S) IN EACH NOSTRIL ONCE DAILY AS NEEDED FOR ALLERGY OR RHINITIS    . gabapentin (NEURONTIN) 400 MG capsule Take 1 capsule (400 mg total) by mouth 4 (four) times daily.    . Lidocaine HCl (ASPERCREME LIDOCAINE) 4 % LIQD Apply 1 application topically 3 (three) times daily as needed (pain).    Marland Kitchen loratadine (CLARITIN) 10 MG tablet Take 1 tablet (10 mg total) by mouth daily.    . Menthol (BIOFREEZE) 10 % AERO Apply 1 application topically 3 (three) times daily as needed (pain).    Marland Kitchen oxyCODONE-acetaminophen (PERCOCET) 10-325 MG tablet Take 1 tablet by mouth 4 (four) times daily as needed for pain.     Marland Kitchen POTASSIUM PO Take 50 mg by mouth daily.     . Probiotic Product (PHILLIPS COLON HEALTH PO) Take 1 capsule by mouth daily.    . SYMBICORT 80-4.5 MCG/ACT inhaler  INHALE 2 PUFFS BY MOUTH TWICE DAILY (Patient taking differently: Inhale 2 puffs into the lungs 2 (two) times a day. )    . VENTOLIN HFA 108 (90 Base) MCG/ACT inhaler Inhale 2 puffs into the lungs every 4 (four) hours as needed for wheezing or shortness of breath. INHALE 2 PUFFS BY MOUTH EVERY 4 HOURS AS NEEDED FOR WHEEZING    . calcium elemental as carbonate (BARIATRIC TUMS ULTRA) 400 MG chewable tablet Chew 1,000 mg by mouth 2 (two) times daily.    . Cholecalciferol (VITAMIN D3) 125 MCG (5000 UT) CAPS Take 1 capsule (5,000 Units total) by mouth daily. (Patient not taking: Reported on 07/01/2018)    . nystatin (MYCOSTATIN) 100000 UNIT/ML suspension TAKE  5 ML BY MOUTH 4 TIMES DAILY AS NEEDED FOR  THRUSH (Patient not taking: Reported on 10/01/2018)    . triamcinolone ointment (KENALOG) 0.5 % Apply 1 application topically 2 (two) times daily. (Patient not taking: Reported on 10/01/2018)    . Turmeric 500 MG CAPS Take 500 mg by mouth daily.      Review of Systems PER HPI OTHERWISE ALL SYSTEMS ARE NEGATIVE.    Objective:   Physical Exam  TELEPHONE VISIT DUE TO COVID 19, VISIT IS CONDUCTED VIRTUALLY AND WAS REQUESTED BY PATIENT.    Assessment & Plan:

## 2018-10-01 NOTE — Assessment & Plan Note (Signed)
SYMPTOMS CONTROLLED/RESOLVED.  CONTINUE TO MONITOR SYMPTOMS. FOLLOW UP IN 6 MOS.  

## 2018-10-01 NOTE — Assessment & Plan Note (Signed)
SYMPTOMS FAIRLY WELL CONTROLLED.  CONTINUE TO MONITOR SYMPTOMS. FOLLOW UP IN 6 MOS.

## 2018-10-05 ENCOUNTER — Telehealth: Payer: Self-pay

## 2018-10-05 NOTE — Progress Notes (Signed)
ON RECALL  °

## 2018-10-05 NOTE — Telephone Encounter (Signed)
Pt left Vm asking about an appt. I have returned her call and [facility] for a return call.

## 2018-10-06 NOTE — Telephone Encounter (Signed)
Pt said she forgot to discuss with Dr. Oneida Alar her concern about swallowing. She has more difficulty with swallowing water.  She is having hoarseness also now and PCP prescribed allergy pills and she said they dry her out too much.  She is eating mostly soft foods and smaller pieces of foods.  She also complains with a rash on her legs and thighs.  I told her she should discuss rash with PCP.  Forwarding to Dr. Oneida Alar to advise.

## 2018-10-07 ENCOUNTER — Other Ambulatory Visit: Payer: Self-pay

## 2018-10-07 DIAGNOSIS — R1310 Dysphagia, unspecified: Secondary | ICD-10-CM

## 2018-10-07 NOTE — Telephone Encounter (Signed)
Called pt, EGD/DIL w/Propofol w/SLF scheduled for 11/24/18 at 2:45pm. Orders entered.

## 2018-10-07 NOTE — Telephone Encounter (Signed)
PLEASE CALL PT. SHOULD CONTINUE A SOFT MECHANICAL DIET. WE WILL GET HER IN FOR AN EGD/DIL W/ MAC WITHIN THE NEXT 4-6 WEEKS. SEE DR. Buelah Manis ABOUT THE RASH.

## 2018-10-07 NOTE — Telephone Encounter (Signed)
PT is aware and said she has an apt with dermatologist tomorrow.  Forwarding to [facility] to schedule EGD.

## 2018-10-08 DIAGNOSIS — L218 Other seborrheic dermatitis: Secondary | ICD-10-CM | POA: Diagnosis not present

## 2018-10-08 DIAGNOSIS — S70361A Insect bite (nonvenomous), right thigh, initial encounter: Secondary | ICD-10-CM | POA: Diagnosis not present

## 2018-10-08 NOTE — Telephone Encounter (Signed)
Pre-op appt 11/20/18 at 10:00am. Letter mailed with procedure instructions.

## 2018-10-15 DIAGNOSIS — Z79891 Long term (current) use of opiate analgesic: Secondary | ICD-10-CM | POA: Diagnosis not present

## 2018-10-15 DIAGNOSIS — M542 Cervicalgia: Secondary | ICD-10-CM | POA: Diagnosis not present

## 2018-10-15 DIAGNOSIS — M545 Low back pain: Secondary | ICD-10-CM | POA: Diagnosis not present

## 2018-10-15 DIAGNOSIS — G894 Chronic pain syndrome: Secondary | ICD-10-CM | POA: Diagnosis not present

## 2018-10-15 DIAGNOSIS — R1084 Generalized abdominal pain: Secondary | ICD-10-CM | POA: Diagnosis not present

## 2018-10-19 ENCOUNTER — Ambulatory Visit: Payer: Medicare Other | Admitting: "Endocrinology

## 2018-10-29 DIAGNOSIS — L281 Prurigo nodularis: Secondary | ICD-10-CM | POA: Diagnosis not present

## 2018-10-29 DIAGNOSIS — D485 Neoplasm of uncertain behavior of skin: Secondary | ICD-10-CM | POA: Diagnosis not present

## 2018-11-09 ENCOUNTER — Ambulatory Visit: Payer: Medicare Other

## 2018-11-10 ENCOUNTER — Encounter: Payer: Self-pay | Admitting: Family Medicine

## 2018-11-10 ENCOUNTER — Ambulatory Visit (INDEPENDENT_AMBULATORY_CARE_PROVIDER_SITE_OTHER): Payer: Medicare Other | Admitting: Family Medicine

## 2018-11-10 ENCOUNTER — Other Ambulatory Visit: Payer: Self-pay

## 2018-11-10 VITALS — BP 126/68 | HR 82 | Temp 98.4°F | Resp 14 | Ht 60.0 in | Wt 104.0 lb

## 2018-11-10 DIAGNOSIS — N760 Acute vaginitis: Secondary | ICD-10-CM

## 2018-11-10 DIAGNOSIS — R945 Abnormal results of liver function studies: Secondary | ICD-10-CM

## 2018-11-10 DIAGNOSIS — E876 Hypokalemia: Secondary | ICD-10-CM | POA: Diagnosis not present

## 2018-11-10 DIAGNOSIS — M810 Age-related osteoporosis without current pathological fracture: Secondary | ICD-10-CM

## 2018-11-10 DIAGNOSIS — R7989 Other specified abnormal findings of blood chemistry: Secondary | ICD-10-CM

## 2018-11-10 LAB — URINALYSIS, ROUTINE W REFLEX MICROSCOPIC
Bilirubin Urine: NEGATIVE
Glucose, UA: NEGATIVE
Hgb urine dipstick: NEGATIVE
Hyaline Cast: NONE SEEN /LPF
Ketones, ur: NEGATIVE
Nitrite: NEGATIVE
Protein, ur: NEGATIVE
Specific Gravity, Urine: 1.015 (ref 1.001–1.03)
pH: 7 (ref 5.0–8.0)

## 2018-11-10 LAB — WET PREP FOR TRICH, YEAST, CLUE

## 2018-11-10 LAB — MICROSCOPIC MESSAGE

## 2018-11-10 MED ORDER — DENOSUMAB 60 MG/ML ~~LOC~~ SOSY
60.0000 mg | PREFILLED_SYRINGE | Freq: Once | SUBCUTANEOUS | Status: AC
  Administered 2018-11-10: 60 mg via SUBCUTANEOUS

## 2018-11-10 NOTE — Patient Instructions (Signed)
Follow up as needed if not improving  Start using topical antifungal cream (can use monistat)  Stop using topical steroids to genital area  I am going to check labs and if liver function is good I will send in oral meds once I have those lab results tomorrow morning.   Vaginitis  Vaginitis is irritation and swelling (inflammation) of the vagina. It happens when normal bacteria and yeast in the vagina grow too much. There are many types of this condition. Treatment will depend on the type you have. Follow these instructions at home: Lifestyle  Keep your vagina area clean and dry. ? Avoid using soap. ? Rinse the area with water.  Do not do the following until your doctor says it is okay: ? Wash and clean out the vagina (douche). ? Use tampons. ? Have sex.  Wipe from front to back after going to the bathroom.  Let air reach your vagina. ? Wear cotton underwear. ? Do not wear: ? Underwear while you sleep. ? Tight pants. ? Thong underwear. ? Underwear or nylons without a cotton panel. ? Take off any wet clothing, such as bathing suits, as soon as possible.  Use gentle, non-scented products. Do not use things that can irritate the vagina, such as fabric softeners. Avoid the following products if they are scented: ? Feminine sprays. ? Detergents. ? Tampons. ? Feminine hygiene products. ? Soaps or bubble baths.  Practice safe sex and use condoms. General instructions  Take over-the-counter and prescription medicines only as told by your doctor.  If you were prescribed an antibiotic medicine, take or use it as told by your doctor. Do not stop taking or using the antibiotic even if you start to feel better.  Keep all follow-up visits as told by your doctor. This is important. Contact a doctor if:  You have pain in your belly.  You have a fever.  Your symptoms last for more than 2-3 days. Get help right away if:  You have a fever and your symptoms get worse all of a  sudden. Summary  Vaginitis is irritation and swelling of the vagina. It can happen when the normal bacteria and yeast in the vagina grow too much. There are many types.  Treatment will depend on the type you have.  Do not douche, use tampons , or have sex until your health care provider approves. When you can return to sex, practice safe sex and use condoms. This information is not intended to replace advice given to you by your health care provider. Make sure you discuss any questions you have with your health care provider. Document Released: 07/19/2008 Document Revised: 04/04/2017 Document Reviewed: 05/14/2016 Elsevier Patient Education  2020 [employer].

## 2018-11-10 NOTE — Progress Notes (Signed)
Patient ID: [name] [name], female    DOB: 1955/11/20, 63 y.o.   MRN: [id number]  PCP: Alycia Rossetti, MD  Chief Complaint  Patient presents with  . Vaginitis    yellow dischargewith itching    Subjective:   BRITTENEY [name] is a 63 y.o. female, presents to clinic with CC of genital itching and scant vaginal discharge Vaginal Itching The patient's primary symptoms include genital itching, a genital odor and vaginal discharge. The patient's pertinent negatives include no genital lesions, genital rash, pelvic pain or vaginal bleeding. This is a new problem. Episode onset: 1.5 weeks. The problem occurs constantly. Pertinent negatives include no abdominal pain, anorexia, back pain, chills, constipation, diarrhea, discolored urine, dysuria, fever, flank pain, frequency, hematuria, joint swelling, nausea, rash, sore throat or urgency. The vaginal discharge was yellow and scant. There has been no bleeding. The symptoms are aggravated by urinating (movement, clothing/friction).   1.5 weeks of vaginal and genital itching, no improvement with applying topical steroid cream.  She bought monistat 7 but was afraid to use it.  She's taking benadryl and wearing loose fitting clothing w/o improvement.  Not sexually active for years.  No abd pain, dysuria, hematuria, urinary frequency, urgency, flank pain, N, V, D, fever, chills, sweats.   Patient Active Problem List   Diagnosis Date Noted  . Seasonal allergies 09/01/2018  . Spondylolisthesis of lumbar region 07/16/2018  . Perianal cyst 02/24/2018  . Sebaceous cyst 02/20/2018  . Dyspepsia 02/12/2018  . Alcohol abuse 12/23/2017  . Tobacco use 06/25/2017  . Loss of weight 07/04/2015  . Osteoarthritis 12/27/2014  . Allergic dermatitis eyelid 12/27/2014  . Impetigo 04/05/2014  . Abnormal CT scan, chest 01/12/2014  . Infected insect bite of left breast 11/17/2012  . Polycythemia 09/08/2012  . Vertigo 01/22/2012  . Chronic pain 12/27/2011  .  Dysphagia 03/14/2011  . DEPRESSION 04/24/2010  . Chronic bronchitis ([place]) 01/19/2010  . Dyspnea 08/04/2009  . Osteoporosis 11/11/2008  . BACK PAIN, LUMBAR, WITH RADICULOPATHY 07/28/2008  . TRANSAMINASES, SERUM, ELEVATED 03/18/2008  . 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/2007  . 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 Hyperlipidemia 02/03/2007  . VARCIES, SITE [place] 09/30/2006  . Anxiety state 02/27/2006  . Essential hypertension 02/27/2006  . GERD 02/27/2006  . Crohn's disease of small intestine with complication ([place]) 59/93/5701     Prior to Admission medications   Medication Sig Start Date End Date Taking? Authorizing Provider  amLODipine (NORVASC) 5 MG tablet Take 1 tablet by mouth once daily 09/07/18  Yes [place], Modena Nunnery, MD  benzonatate (TESSALON) 200 MG capsule Take 200 mg by mouth 3 (three) times daily as needed for cough.   Yes [provider]  budesonide-formoterol (SYMBICORT) 80-4.5 MCG/ACT inhaler INHALE 2 PUFFS BY MOUTH TWICE DAILY 06/05/18  Yes [provider]  Calcium Carb-Cholecalciferol (CALCIUM 600 + D PO) Take by mouth daily.   Yes [provider]  calcium carbonate (TUMS - DOSED IN MG ELEMENTAL CALCIUM) 500 MG chewable tablet Chew 1 tablet (200 mg of elemental calcium total) by mouth 3 (three) times daily with meals. Patient taking differently: Chew 1 tablet by mouth as needed.  04/16/18  Yes Cassandria Anger, MD  calcium elemental as carbonate (BARIATRIC TUMS ULTRA) 400 MG chewable tablet Chew 1,000 mg by mouth 2 (two) times daily.   Yes [provider]  Cholecalciferol (VITAMIN D3) 125 MCG (5000 UT) CAPS Take 1 capsule (5,000 Units total) by mouth daily. 04/16/18  Yes [place], Marella Chimes, MD  cholestyramine Lucrezia Starch) 4 GM/DOSE powder DISSOLVE AND TAKE 2 GRAMS BY MOUTH TWICE DAILY WITH A MEAL- DO NOT TAKE WITHIN 2 HOURS OF OTHER MEDICATIONS Patient taking differently: Take 4 g by mouth daily.  06/25/17  Yes  Mahala Menghini, PA-C  clobetasol (TEMOVATE) 0.05 % external solution APPLY SOLUTION TO AFFECTED AREA(S) ON SCALP TWICE DAILY AS NEEDED DO NOT APPLY TO FACE GROIN OR UNDERARMS 10/17/18  Yes [provider]  denosumab (PROLIA) 60 MG/ML SOSY injection Inject 60 mg into the skin every 6 (six) months.   Yes [provider]  dexlansoprazole (DEXILANT) 60 MG capsule 1 PO EVERY MORNING WITH BREAKFAST. 10/01/18  Yes Fields, Sandi L, MD  fluticasone (FLONASE) 50 MCG/ACT nasal spray USE TWO SPRAY(S) IN EACH NOSTRIL ONCE DAILY AS NEEDED FOR ALLERGY OR RHINITIS 09/01/18  Yes [place], Modena Nunnery, MD  gabapentin (NEURONTIN) 400 MG capsule Take 1 capsule (400 mg total) by mouth 4 (four) times daily. 09/01/18  Yes [place], Modena Nunnery, MD  Lidocaine HCl (ASPERCREME LIDOCAINE) 4 % LIQD Apply 1 application topically 3 (three) times daily as needed (pain).   Yes [provider]  loratadine (CLARITIN) 10 MG tablet Take 1 tablet (10 mg total) by mouth daily. 09/01/18  Yes [place], Modena Nunnery, MD  Menthol (BIOFREEZE) 10 % AERO Apply 1 application topically 3 (three) times daily as needed (pain).   Yes [provider]  mupirocin ointment (BACTROBAN) 2 % APPLY OINTMENT TO AFFECTED AREA(S) UP TO THREE TIMES DAILY AS NEEDED 10/19/18  Yes [provider]  nystatin (MYCOSTATIN) 100000 UNIT/ML suspension TAKE  5 ML BY MOUTH 4 TIMES DAILY AS NEEDED FOR  THRUSH 09/07/18  Yes [place], Modena Nunnery, MD  oxyCODONE-acetaminophen (PERCOCET) 10-325 MG tablet Take 1 tablet by mouth 4 (four) times daily as needed for pain.  01/31/17  Yes [provider]  POTASSIUM PO Take 50 mg by mouth daily.    Yes [provider]  Probiotic Product ([place]) Take 1 capsule by mouth daily.   Yes [provider]  triamcinolone ointment (KENALOG) 0.5 % Apply 1 application topically 2 (two) times daily. 04/02/17  Yes Derrek Monaco A, NP  Turmeric 500 MG CAPS Take 500 mg by mouth daily.     Yes [provider]  VENTOLIN HFA 108 (90 Base) MCG/ACT inhaler Inhale 2 puffs into the lungs every 4 (four) hours as needed for wheezing or shortness of breath. INHALE 2 PUFFS BY MOUTH EVERY 4 HOURS AS NEEDED FOR WHEEZING 07/20/18  Yes [place], Modena Nunnery, MD     Allergies  Allergen Reactions  . Naproxen Shortness Of Breath    Chest pain  . Eggs Or Egg-Derived Products Diarrhea    abd pain  . Lovastatin Other (See Comments)    Chest pain  . Toradol [Ketorolac Tromethamine] Other (See Comments)    Headache. Non-effective  . Tramadol Other (See Comments)    Headache. Non-effective.  Marland Kitchen Nexium [Esomeprazole Magnesium] Diarrhea    Abdominal pain     Family History  Problem Relation Age of Onset  . Hypothyroidism Mother   . Heart disease Father   . Parkinson's disease Father   . Hypothyroidism Daughter   . Heart attack Paternal Grandfather   . Alzheimer's disease Paternal Grandmother   . Heart attack Maternal Grandfather   . Crohn's disease Sister   . Other Sister        was murdered  . Crohn's disease Maternal  Uncle   . Colon cancer Neg Hx   . Colon polyps Neg Hx      Social History   Socioeconomic History  . Marital status: Single    Spouse name: Spencer  . Number of children: 2  . Years of education: GED  . Highest education level: Not on file  Occupational History    Comment: disabled  Social Needs  . Financial resource strain: Not on file  . Food insecurity    Worry: Not on file    Inability: Not on file  . Transportation needs    Medical: Not on file    Non-medical: Not on file  Tobacco Use  . Smoking status: Current Every Day Smoker    Packs/day: 1.00    Years: 30.00    Pack years: 30.00    Types: Cigarettes  . Smokeless tobacco: Never Used  Substance and Sexual Activity  . Alcohol use: Yes    Comment: "beer here and there"  . Drug use: No  . Sexual activity: Not Currently    Birth control/protection: Post-menopausal  Lifestyle  .  Physical activity    Days per week: Not on file    Minutes per session: Not on file  . Stress: Not on file  Relationships  . Social Herbalist on phone: Not on file    Gets together: Not on file    Attends religious service: Not on file    Active member of club or organization: Not on file    Attends meetings of clubs or organizations: Not on file    Relationship status: Not on file  . Intimate partner violence    Fear of current or ex partner: Not on file    Emotionally abused: Not on file    Physically abused: Not on file    Forced sexual activity: Not on file  Other Topics Concern  . Not on file  Social History Narrative   Lives with husband   no caffiene     Review of Systems  Constitutional: Negative.  Negative for chills and fever.  HENT: Negative.  Negative for sore throat.   Eyes: Negative.   Respiratory: Negative.   Cardiovascular: Negative.   Gastrointestinal: Negative.  Negative for abdominal pain, anorexia, constipation, diarrhea and nausea.  Endocrine: Negative.   Genitourinary: Positive for vaginal discharge. Negative for dysuria, flank pain, frequency, hematuria, pelvic pain and urgency.  Musculoskeletal: Negative.  Negative for back pain.  Skin: Negative.  Negative for rash.  Allergic/Immunologic: Negative.   Neurological: Negative.   Hematological: Negative.   Psychiatric/Behavioral: Negative.   All other systems reviewed and are negative.      Objective:    Vitals:   11/10/18 1102  BP: 126/68  Pulse: 82  Resp: 14  Temp: 98.4 F (36.9 C)  TempSrc: Oral  SpO2: 97%  Weight: 104 lb (47.2 kg)  Height: 5' (1.524 m)      Physical Exam Vitals signs and nursing note reviewed. Exam conducted with a chaperone present.  Constitutional:      General: She is not in acute distress.    Appearance: Normal appearance. She is well-developed. She is not ill-appearing, toxic-appearing or diaphoretic.     Comments: Chronically ill and thin  appearing female, slightly anxious and hyperactive   HENT:     Head: Normocephalic and atraumatic.     Nose: Nose normal.  Eyes:     General:        Right eye: No  discharge.        Left eye: No discharge.     Conjunctiva/sclera: Conjunctivae normal.  Neck:     Trachea: No tracheal deviation.  Cardiovascular:     Rate and Rhythm: Normal rate and regular rhythm.  Pulmonary:     Effort: Pulmonary effort is normal. No respiratory distress.     Breath sounds: No stridor.  Abdominal:     General: Abdomen is flat. Bowel sounds are normal. There is no distension.     Palpations: Abdomen is soft. There is no mass.     Tenderness: There is no abdominal tenderness. There is no right CVA tenderness, left CVA tenderness, guarding or rebound.     Hernia: No hernia is present. There is no hernia in the left inguinal area or right inguinal area.  Genitourinary:    General: Normal vulva.     Pubic Area: No rash.      Labia:        Right: No rash, tenderness, lesion or injury.        Left: No rash, tenderness, lesion or injury.      Comments: Normal appearing labia, external vagina slightly atrophic appearing Blind vaginal swabs obtained Musculoskeletal: Normal range of motion.  Lymphadenopathy:     Lower Body: No right inguinal adenopathy. No left inguinal adenopathy.  Skin:    General: Skin is warm and dry.     Findings: No rash.  Neurological:     Mental Status: She is alert.     Motor: No abnormal muscle tone.     Coordination: Coordination normal.  Psychiatric:        Mood and Affect: Mood normal.        Behavior: Behavior normal.     Results for orders placed or performed in visit on 11/10/18  WET PREP FOR [place], YEAST, CLUE   Specimen: Genital  Result Value Ref Range   Source: VAGINA    RESULT    Urinalysis, Routine w reflex microscopic  Result Value Ref Range   Color, Urine YELLOW YELLOW   APPearance CLEAR CLEAR   Specific Gravity, Urine 1.015 1.001 - 1.03   pH 7.0 5.0 -  8.0   Glucose, UA NEGATIVE NEGATIVE   Bilirubin Urine NEGATIVE NEGATIVE   Ketones, ur NEGATIVE NEGATIVE   Hgb urine dipstick NEGATIVE NEGATIVE   Protein, ur NEGATIVE NEGATIVE   Nitrite NEGATIVE NEGATIVE   Leukocytes,Ua 1+ (A) NEGATIVE   WBC, UA 0-5 0 - 5 /HPF   RBC / HPF 0-2 0 - 2 /HPF   Squamous Epithelial / LPF 0-5 < OR = 5 /HPF   Bacteria, UA FEW (A) NONE SEEN /HPF   Hyaline Cast NONE SEEN NONE SEEN /LPF  Microscopic Message  Result Value Ref Range   Note     Wet prep neg for trich and neg for clue cells, positive for bacteria Dx:  Acute vaginitis Specimen Information: Genital     Component 11:14  Source: VAGINA   RESULT   Comment: TRICHOMONAS-NONE SEEN  YEAST-NONE SEEN  CLUE CELLS-NONE SEEN  EPITHELIAL CELLS-PRESENT  WBC-FEW  BACTERIA-MODERATE   Resulting Agency [facility]      Specimen Collected: 11/10/18 11:14 Last Resulted: 11/10/18 12:29            Assessment & Plan:   63 y/o female presents with 1.5 weeks of acute vaginitis     ICD-10-CM   1. Acute vaginitis  N76.0 WET PREP FOR TRICH, YEAST, CLUE    C. trachomatis/N.  gonorrhoeae RNA    Trichomonas vaginalis, RNA    Urinalysis, Routine w reflex microscopic    CANCELED: Urine Culture   wet prep and STD swabs obtained, clinically most suspicious for yeast vaginitis, check CMP due to prior elevated LFT and would like to tx with diflucan  2. Elevated LFTs  P79.4 COMPLETE METABOLIC PANEL WITH GFR   recheck LFT, may need to tx pt with diflucan  3. Hypokalemia  F27.6 COMPLETE METABOLIC PANEL WITH GFR   low K with last last, will recheck, did supplement  4. Hypocalcemia  D47.09 COMPLETE METABOLIC PANEL WITH GFR   mildly low calcium with last labs, supplementing, getting prolia today, will recheck today     Since pt has hx of elevated LFT's, will start tx with topical and vaginal antifungal/monistat tx, d/c topical steroids to genitals.  If LFTs are similar to last labs (mildly elevated) will tx with diflucan  150 mg once PO.  STD labs pending.  Vaginitis is not BV.  May have some atrophic vaginitis, but not sexually active right now and no urinary sx, so will not treat with premarin cream right now, will first tx yeast vaginitis and see if sx resolve.    Delsa Grana, PA-C 11/10/18 11:18 AM

## 2018-11-11 LAB — TRICHOMONAS VAGINALIS, PROBE AMP: Trichomonas vaginalis RNA: NOT DETECTED

## 2018-11-11 LAB — COMPLETE METABOLIC PANEL WITH GFR
AG Ratio: 1.8 (calc) (ref 1.0–2.5)
ALT: 79 U/L — ABNORMAL HIGH (ref 6–29)
AST: 80 U/L — ABNORMAL HIGH (ref 10–35)
Albumin: 4.3 g/dL (ref 3.6–5.1)
Alkaline phosphatase (APISO): 105 U/L (ref 37–153)
BUN: 9 mg/dL (ref 7–25)
CO2: 30 mmol/L (ref 20–32)
Calcium: 9.8 mg/dL (ref 8.6–10.4)
Chloride: 103 mmol/L (ref 98–110)
Creat: 0.55 mg/dL (ref 0.50–0.99)
GFR, Est African American: 117 mL/min/{1.73_m2} (ref >=60)
GFR, Est Non African American: 101 mL/min/{1.73_m2} (ref >=60)
Globulin: 2.4 g/dL (calc) (ref 1.9–3.7)
Glucose, Bld: 92 mg/dL (ref 65–99)
Potassium: 4.4 mmol/L (ref 3.5–5.3)
Sodium: 139 mmol/L (ref 135–146)
Total Bilirubin: 0.6 mg/dL (ref 0.2–1.2)
Total Protein: 6.7 g/dL (ref 6.1–8.1)

## 2018-11-11 LAB — C. TRACHOMATIS/N. GONORRHOEAE RNA
C. trachomatis RNA, TMA: NOT DETECTED
N. gonorrhoeae RNA, TMA: NOT DETECTED

## 2018-11-12 DIAGNOSIS — M542 Cervicalgia: Secondary | ICD-10-CM | POA: Diagnosis not present

## 2018-11-12 DIAGNOSIS — M545 Low back pain: Secondary | ICD-10-CM | POA: Diagnosis not present

## 2018-11-12 DIAGNOSIS — R1084 Generalized abdominal pain: Secondary | ICD-10-CM | POA: Diagnosis not present

## 2018-11-12 DIAGNOSIS — G894 Chronic pain syndrome: Secondary | ICD-10-CM | POA: Diagnosis not present

## 2018-11-12 DIAGNOSIS — Z79891 Long term (current) use of opiate analgesic: Secondary | ICD-10-CM | POA: Diagnosis not present

## 2018-11-16 ENCOUNTER — Telehealth: Payer: Self-pay | Admitting: Obstetrics and Gynecology

## 2018-11-16 NOTE — Telephone Encounter (Signed)
Pt states that she is having itching in her vagina. She went to her PCP and her urine test came out good, but enzymes were elevated. She states that they are treating her for a yeast infection but that its not a yeast infection. Pt states that she is having yellow discharge with bumps on her labia. Pt requesting an appt.

## 2018-11-20 ENCOUNTER — Other Ambulatory Visit: Payer: Self-pay

## 2018-11-20 ENCOUNTER — Encounter (HOSPITAL_COMMUNITY)
Admission: RE | Admit: 2018-11-20 | Discharge: 2018-11-20 | Disposition: A | Discharge status: Home / Self Care | Payer: Medicare Other | Source: Ambulatory Visit | Attending: Gastroenterology | Admitting: Gastroenterology

## 2018-11-20 ENCOUNTER — Other Ambulatory Visit (HOSPITAL_COMMUNITY)
Admission: RE | Admit: 2018-11-20 | Discharge: 2018-11-20 | Disposition: A | Discharge status: Home / Self Care | Payer: Medicare Other | Source: Ambulatory Visit | Attending: Gastroenterology | Admitting: Gastroenterology

## 2018-11-20 DIAGNOSIS — M4316 Spondylolisthesis, lumbar region: Secondary | ICD-10-CM | POA: Diagnosis not present

## 2018-11-20 DIAGNOSIS — Z1159 Encounter for screening for other viral diseases: Secondary | ICD-10-CM | POA: Insufficient documentation

## 2018-11-20 DIAGNOSIS — R030 Elevated blood-pressure reading, without diagnosis of hypertension: Secondary | ICD-10-CM | POA: Diagnosis not present

## 2018-11-20 LAB — SARS CORONAVIRUS 2 (TAT 6-24 HRS): SARS Coronavirus 2: NEGATIVE

## 2018-11-24 ENCOUNTER — Encounter (HOSPITAL_COMMUNITY)
Admission: RE | Disposition: A | Discharge status: Home / Self Care | Payer: Self-pay | Source: Home / Self Care | Attending: Gastroenterology

## 2018-11-24 ENCOUNTER — Telehealth: Payer: Self-pay | Admitting: Obstetrics and Gynecology

## 2018-11-24 ENCOUNTER — Ambulatory Visit (HOSPITAL_COMMUNITY): Payer: Medicare Other | Admitting: Anesthesiology

## 2018-11-24 ENCOUNTER — Encounter (HOSPITAL_COMMUNITY): Payer: Self-pay | Admitting: *Deleted

## 2018-11-24 ENCOUNTER — Ambulatory Visit (HOSPITAL_COMMUNITY)
Admission: RE | Admit: 2018-11-24 | Discharge: 2018-11-24 | Disposition: A | Discharge status: Home / Self Care | Payer: Medicare Other | Attending: Gastroenterology | Admitting: Gastroenterology

## 2018-11-24 ENCOUNTER — Other Ambulatory Visit: Payer: Self-pay

## 2018-11-24 DIAGNOSIS — I10 Essential (primary) hypertension: Secondary | ICD-10-CM | POA: Insufficient documentation

## 2018-11-24 DIAGNOSIS — K222 Esophageal obstruction: Secondary | ICD-10-CM | POA: Insufficient documentation

## 2018-11-24 DIAGNOSIS — R1310 Dysphagia, unspecified: Secondary | ICD-10-CM

## 2018-11-24 DIAGNOSIS — K449 Diaphragmatic hernia without obstruction or gangrene: Secondary | ICD-10-CM | POA: Insufficient documentation

## 2018-11-24 DIAGNOSIS — Z79899 Other long term (current) drug therapy: Secondary | ICD-10-CM | POA: Diagnosis not present

## 2018-11-24 DIAGNOSIS — M810 Age-related osteoporosis without current pathological fracture: Secondary | ICD-10-CM | POA: Insufficient documentation

## 2018-11-24 DIAGNOSIS — G4730 Sleep apnea, unspecified: Secondary | ICD-10-CM | POA: Insufficient documentation

## 2018-11-24 DIAGNOSIS — M1990 Unspecified osteoarthritis, unspecified site: Secondary | ICD-10-CM | POA: Diagnosis not present

## 2018-11-24 DIAGNOSIS — F17210 Nicotine dependence, cigarettes, uncomplicated: Secondary | ICD-10-CM | POA: Insufficient documentation

## 2018-11-24 DIAGNOSIS — K2960 Other gastritis without bleeding: Secondary | ICD-10-CM | POA: Diagnosis not present

## 2018-11-24 DIAGNOSIS — J449 Chronic obstructive pulmonary disease, unspecified: Secondary | ICD-10-CM | POA: Insufficient documentation

## 2018-11-24 DIAGNOSIS — F419 Anxiety disorder, unspecified: Secondary | ICD-10-CM | POA: Diagnosis not present

## 2018-11-24 DIAGNOSIS — Z7983 Long term (current) use of bisphosphonates: Secondary | ICD-10-CM | POA: Diagnosis not present

## 2018-11-24 DIAGNOSIS — J309 Allergic rhinitis, unspecified: Secondary | ICD-10-CM | POA: Insufficient documentation

## 2018-11-24 DIAGNOSIS — Z7951 Long term (current) use of inhaled steroids: Secondary | ICD-10-CM | POA: Diagnosis not present

## 2018-11-24 DIAGNOSIS — K219 Gastro-esophageal reflux disease without esophagitis: Secondary | ICD-10-CM | POA: Diagnosis not present

## 2018-11-24 DIAGNOSIS — K2970 Gastritis, unspecified, without bleeding: Secondary | ICD-10-CM | POA: Diagnosis not present

## 2018-11-24 HISTORY — PX: SAVORY DILATION: SHX5439

## 2018-11-24 HISTORY — PX: ESOPHAGOGASTRODUODENOSCOPY (EGD) WITH PROPOFOL: SHX5813

## 2018-11-24 SURGERY — ESOPHAGOGASTRODUODENOSCOPY (EGD) WITH PROPOFOL
Anesthesia: General

## 2018-11-24 MED ORDER — MINERAL OIL PO OIL
TOPICAL_OIL | ORAL | Status: AC
  Filled 2018-11-24: qty 30

## 2018-11-24 MED ORDER — KETAMINE HCL 50 MG/5ML IJ SOSY
PREFILLED_SYRINGE | INTRAMUSCULAR | Status: AC
  Filled 2018-11-24: qty 5

## 2018-11-24 MED ORDER — LACTATED RINGERS IV SOLN
INTRAVENOUS | Status: DC
  Administered 2018-11-24 (×2): via INTRAVENOUS

## 2018-11-24 MED ORDER — MIDAZOLAM HCL 5 MG/5ML IJ SOLN
INTRAMUSCULAR | Status: DC | PRN
  Administered 2018-11-24: 2 mg via INTRAVENOUS

## 2018-11-24 MED ORDER — PROPOFOL 10 MG/ML IV BOLUS
INTRAVENOUS | Status: AC
  Filled 2018-11-24: qty 20

## 2018-11-24 MED ORDER — LIDOCAINE VISCOUS HCL 2 % MT SOLN
OROMUCOSAL | Status: AC
  Filled 2018-11-24: qty 15

## 2018-11-24 MED ORDER — MIDAZOLAM HCL 2 MG/2ML IJ SOLN
INTRAMUSCULAR | Status: AC
  Filled 2018-11-24: qty 2

## 2018-11-24 MED ORDER — LIDOCAINE VISCOUS HCL 2 % MT SOLN: 10.0000 mL | Freq: Once | OROMUCOSAL | Status: DC

## 2018-11-24 MED ORDER — MIDAZOLAM HCL 2 MG/2ML IJ SOLN: 0.5000 mg | Freq: Once | INTRAMUSCULAR | Status: DC | PRN

## 2018-11-24 MED ORDER — PROMETHAZINE HCL 25 MG/ML IJ SOLN: 6.2500 mg | INTRAMUSCULAR | Status: DC | PRN

## 2018-11-24 MED ORDER — LIDOCAINE VISCOUS HCL 2 % MT SOLN
OROMUCOSAL | Status: DC | PRN
  Administered 2018-11-24: 1 via OROMUCOSAL

## 2018-11-24 MED ORDER — PROPOFOL 500 MG/50ML IV EMUL
INTRAVENOUS | Status: DC | PRN
  Administered 2018-11-24: 150 ug/kg/min via INTRAVENOUS

## 2018-11-24 MED ORDER — DEXAMETHASONE SODIUM PHOSPHATE 10 MG/ML IJ SOLN
INTRAMUSCULAR | Status: DC | PRN
  Administered 2018-11-24: 4 mg via INTRAVENOUS

## 2018-11-24 MED ORDER — PROPOFOL 10 MG/ML IV BOLUS
INTRAVENOUS | Status: DC | PRN
  Administered 2018-11-24 (×2): 20 mg via INTRAVENOUS

## 2018-11-24 MED ORDER — CHLORHEXIDINE GLUCONATE CLOTH 2 % EX PADS: 6.0000 | MEDICATED_PAD | Freq: Once | CUTANEOUS | Status: DC

## 2018-11-24 MED ORDER — KETAMINE HCL 10 MG/ML IJ SOLN
INTRAMUSCULAR | Status: DC | PRN
  Administered 2018-11-24 (×3): 10 mg via INTRAVENOUS
  Administered 2018-11-24: 5 mg via INTRAVENOUS

## 2018-11-24 NOTE — Transfer of Care (Signed)
Immediate Anesthesia Transfer of Care Note  Patient: [name] [name]  Procedure(s) Performed: ESOPHAGOGASTRODUODENOSCOPY (EGD) WITH PROPOFOL (N/A ) SAVORY DILATION (N/A )  Patient Location: PACU  Anesthesia Type:General  Level of Consciousness: awake and patient cooperative  Airway & Oxygen Therapy: Patient Spontanous Breathing and Patient connected to nasal cannula oxygen  Post-op Assessment: Report given to RN, Post -op Vital signs reviewed and stable and Patient moving all extremities  Post vital signs: Reviewed and stable  Last Vitals:  Vitals Value Taken Time  BP    Temp    Pulse    Resp    SpO2      Last Pain:  Vitals:   11/24/18 1234  TempSrc:   PainSc: 3          Complications: No apparent anesthesia complications

## 2018-11-24 NOTE — Telephone Encounter (Signed)
Patient informed we are still not allowing any visitors or children to come in during appointment time unless physical assistance is needed. Asked if has had any exposure to anyone suspected or confirmed of having COVID-19 or if she was experiencing any of the following, to reschedule: fever, cough, shortness of breath, muscle pain, diarrhea, rash, vomiting, abdominal pain, red eye, weakness, bruising, bleeding, joint pain, or a severe headache.  Stated no to all.  Asked that she complete E-check-in via mychart prior to arrival.  Advised to check-in via Hello Patient and either call our office on arrival in our office parking lot to complete registration over the phone or come in but will then need to wait in the car until the nurse calls for her.  Advised to also use the provided hand sanitizer when entering the office and to wear a mask if she has one, if not, we will provide one. Pt verbalized understanding.

## 2018-11-24 NOTE — Anesthesia Postprocedure Evaluation (Signed)
Anesthesia Post Note  Patient: [name] [name]  Procedure(s) Performed: ESOPHAGOGASTRODUODENOSCOPY (EGD) WITH PROPOFOL (N/A ) SAVORY DILATION (N/A )  Patient location during evaluation: PACU Anesthesia Type: General Level of consciousness: awake and alert and patient cooperative Pain management: pain level controlled Vital Signs Assessment: post-procedure vital signs reviewed and stable Respiratory status: spontaneous breathing, respiratory function stable and nonlabored ventilation Cardiovascular status: blood pressure returned to baseline Postop Assessment: no apparent nausea or vomiting Anesthetic complications: no     Last Vitals:  Vitals:   11/24/18 1252 11/24/18 1302  BP: 134/88 (!) 137/94  Pulse: 86 85  Resp: 19 15  Temp: 36.6 C   SpO2: 98% 100%    Last Pain:  Vitals:   11/24/18 1302  TempSrc:   PainSc: 0-No pain                 Mabel Roll J

## 2018-11-24 NOTE — Anesthesia Preprocedure Evaluation (Addendum)
Anesthesia Evaluation  Patient identified by MRN, date of birth, ID band Patient awake  General Assessment Comment:Pt reports awareness under conscious sedation  Denies PTSD Pt reassured, very low risk of awareness  Reviewed: Allergy & Precautions, NPO status , Patient's Chart, lab work & pertinent test results  History of Anesthesia Complications (+) PONV and history of anesthetic complications  Airway Mallampati: II  TM Distance: >3 FB Neck ROM: Full    Dental no notable dental hx. (+) Teeth Intact   Pulmonary shortness of breath and with exertion, asthma , neg sleep apnea, COPD,  COPD inhaler, Current Smoker,  Reports uses symbicort daily  States no albuterol use since march 2020 Denies OSA or CPAP use when questioned   Pulmonary exam normal breath sounds clear to auscultation       Cardiovascular Exercise Tolerance: Poor hypertension, Pt. on medications negative cardio ROS Normal cardiovascular examII Rhythm:Regular Rate:Normal  Pt s/p multi level fusion in 07/2018 Continues on pain meds  Denies Cp or known cardiac issues    Neuro/Psych  Headaches, PSYCHIATRIC DISORDERS Anxiety Depression  Neuromuscular disease    GI/Hepatic Neg liver ROS, GERD  Medicated and Controlled,Here for EGD/poss dilation   Endo/Other  negative endocrine ROS  Renal/GU negative Renal ROS  negative genitourinary   Musculoskeletal  (+) Arthritis , Osteoarthritis,    Abdominal   Peds negative pediatric ROS (+)  Hematology negative hematology ROS (+)   Anesthesia Other Findings   Reproductive/Obstetrics negative OB ROS                            Anesthesia Physical Anesthesia Plan  ASA: III  Anesthesia Plan: General   Post-op Pain Management:    Induction: Intravenous  PONV Risk Score and Plan: 2 and Treatment may vary due to age or medical condition, Propofol infusion, TIVA and Ondansetron  Airway  Management Planned: Nasal Cannula and Simple Face Mask  Additional Equipment:   Intra-op Plan:   Post-operative Plan:   Informed Consent: I have reviewed the patients History and Physical, chart, labs and discussed the procedure including the risks, benefits and alternatives for the proposed anesthesia with the patient or authorized representative who has indicated his/her understanding and acceptance.     Dental advisory given  Plan Discussed with: CRNA  Anesthesia Plan Comments: (Plan full PPE use  Plan GA with GETA as needed -WTP with same after Q&A)        Anesthesia Quick Evaluation

## 2018-11-24 NOTE — Op Note (Signed)
[facility] Patient Name: [name] [name] Procedure Date: 11/24/2018 12:27 PM MRN: [id number] Date of Birth: [birth year] Attending MD: Barney Drain MD, MD CSN: [id number] Age: 63 Admit Type: Outpatient Procedure:                Upper GI endoscopy WITH ESOPHAGEAL DILATION Indications:              Dysphagia Providers:                Barney Drain MD, MD, Otis Peak B. Sharon Seller, RN, Nelma Rothman, Technician Referring MD:             Modena Nunnery. [place] Medicines:                Propofol per Anesthesia Complications:            No immediate complications. Estimated Blood Loss:     Estimated blood loss was minimal. Procedure:                Pre-Anesthesia Assessment:                           - Prior to the procedure, a History and Physical                            was performed, and patient medications and                            allergies were reviewed. The patient's tolerance of                            previous anesthesia was also reviewed. The risks                            and benefits of the procedure and the sedation                            options and risks were discussed with the patient.                            All questions were answered, and informed consent                            was obtained. Prior Anticoagulants: The patient has                            taken no previous anticoagulant or antiplatelet                            agents. ASA Grade Assessment: II - A patient with                            mild systemic disease. After reviewing the risks  and benefits, the patient was deemed in                            satisfactory condition to undergo the procedure.                            After obtaining informed consent, the endoscope was                            passed under direct vision. Throughout the                            procedure, the patient's blood pressure, pulse, and          oxygen saturations were monitored continuously. The                            GIF-H190 ([id number]) scope was introduced through the                            mouth, and advanced to the second part of duodenum.                            The upper GI endoscopy was accomplished without                            difficulty. The patient tolerated the procedure                            well. Scope In: 12:41:06 PM Scope Out: 12:45:44 PM Total Procedure Duration: 0 hours 4 minutes 38 seconds  Findings:      One benign-appearing, intrinsic moderate (circumferential scarring or       stenosis; an endoscope may pass) stenosis was found. This stenosis       measured 1.3 cm (inner diameter). The stenosis was traversed. A       guidewire was placed and the scope was withdrawn. Dilation was performed       with a Savary dilator with mild resistance at 15 mm, 16 mm and 17 mm.       Estimated blood loss was minimal.      A small hiatal hernia was present.      Diffuse mild inflammation characterized by congestion (edema), erosions       and erythema was found in the gastric body and in the gastric antrum.      The examined duodenum was normal. Impression:               - Benign-appearing esophageal PEPTIC STRICTURE.                           - Small hiatal hernia.                           - MODERATE EROSIVE Gastritis. Moderate Sedation:      Per Anesthesia Care Recommendation:           - Patient has a contact number available for  emergencies. The signs and symptoms of potential                            delayed complications were discussed with the                            patient. Return to normal activities tomorrow.                            Written discharge instructions were provided to the                            patient.                           - Low fat diet.                           - Continue present medications.                           -  Return to my office in 6 months. Procedure Code(s):        --- Professional ---                           [phone], Esophagogastroduodenoscopy, flexible,                            transoral; with insertion of guide wire followed by                            passage of dilator(s) through esophagus over guide                            wire Diagnosis Code(s):        --- Professional ---                           K22.2, Esophageal obstruction                           K44.9, Diaphragmatic hernia without obstruction or                            gangrene                           K29.70, Gastritis, unspecified, without bleeding                           R13.10, Dysphagia, unspecified CPT copyright 2019 [employer] Association. All rights reserved. The codes documented in this report are preliminary and upon coder review may  be revised to meet current compliance requirements. Barney Drain, MD Barney Drain MD, MD 11/24/2018 12:58:26 PM This report has been signed electronically. Number of Addenda: 0

## 2018-11-24 NOTE — H&P (Signed)
Primary Care Physician:  Alycia Rossetti, MD Primary Gastroenterologist:  Dr. Oneida Alar  Pre-Procedure History & Physical: HPI:  [name] [name] is a 63 y.o. female here for DYSPHAGIA.  Past Medical History:  Diagnosis Date  . Allergic rhinitis   . Anastomotic ulcer JAN 2009  . Anxiety   . Arthritis   . Asthma   . BMI (body mass index) 20.0-29.9 2009 121 lbs  . Concussion   . 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 Crohn disease ([place])   . Crohn disease ([place])   . CTS (carpal tunnel syndrome)   . Diarrhea MULITIFACTORIAL   IBS, LACTOSE INTOLERANCE, SBBO, BILE-SALT  . Elevated liver enzymes 2009: BMI 24 ?Etoh ALT 94, AST 51,  NEG ANA, qIGs& ASMA   MAY 2012 AST 52 ALT 38  . Esophageal stricture 2009  . GERD (gastroesophageal reflux disease)   . Hemorrhoid   . Hyperlipemia   . Hypertension   . Inflammatory bowel disease 2006 CD   SURGICAL REMISSION  . LBP (low back pain)   . Mental disorder   . Migraine   . Osteoporosis   . PONV (postoperative nausea and vomiting)   . Shortness of breath    exertion and humidity  . Shoulder pain    right  . Sleep apnea    Stop [name]    Past Surgical History:  Procedure Laterality Date  . BACK SURGERY    . BILATERAL SALPINGOOPHORECTOMY    . BIOPSY  12/24/2011   Procedure: BIOPSY;  Surgeon: Danie Binder, MD;  Location: [facility];  Service: Endoscopy;;  Gastric Biopsies  . BIOPSY N/A 06/30/2012   Procedure: BIOPSY;  Surgeon: Danie Binder, MD;  Location: [facility];  Service: Endoscopy;  Laterality: N/A;  Gastric and Esophageal Biopsies  . CARPAL TUNNEL RELEASE  LEFT  . CATARACT EXTRACTION W/PHACO Right 07/30/2016   Procedure: CATARACT EXTRACTION PHACO AND INTRAOCULAR LENS PLACEMENT (IOC);  Surgeon: Rutherford Guys, MD;  Location: [facility];  Service: Ophthalmology;  Laterality: Right;  CDE: 5.45  . CATARACT EXTRACTION W/PHACO Left 08/13/2016   Procedure: CATARACT EXTRACTION PHACO AND INTRAOCULAR LENS PLACEMENT (IOC);  Surgeon: Rutherford Guys, MD;  Location: [facility];  Service: Ophthalmology;  Laterality: Left;  CDE: 5.59  . COLON SURGERY    . COLONOSCOPY  JAN 2009 DIARRHEA, ABD PAIN, BRBPR   ANASTOMOTIC ULCER(3MM), IH JS:EGBTDV ULCER, NL COLON bX  . COLONOSCOPY WITH PROPOFOL N/A 04/21/2018   Procedure: COLONOSCOPY WITH PROPOFOL;  Surgeon: Danie Binder, MD;  [facility];  Service: Endoscopy;  Laterality: N/A;  1:15pm  . DILATION AND CURETTAGE OF UTERUS    . ESOPHAGEAL DILATION  12/24/2011   SLF: A stricture was found in the distal esophagus/ Moderate gastritis/ MILD Duodenitis  . ESOPHAGOGASTRODUODENOSCOPY  02/07/09   mild gastritis  . ESOPHAGOGASTRODUODENOSCOPY (EGD) WITH PROPOFOL N/A 06/30/2012   SLF: 1. No definite stricture appreciated 2. small hiatal hernia 3. Gastritis  . ESOPHAGOGASTRODUODENOSCOPY (EGD) WITH PROPOFOL N/A 02/20/2016   Procedure: ESOPHAGOGASTRODUODENOSCOPY (EGD) WITH PROPOFOL;  Surgeon: Danie Binder, MD;  [facility];  Service: Endoscopy;  Laterality: N/A;  930  . EYE SURGERY    . gum flap Bilateral   . HEMICOLECTOMY  RIGHT 2006  . HERNIA REPAIR    . LUMBAR DISC SURGERY    . POLYPECTOMY  04/21/2018   Procedure: POLYPECTOMY;  Surgeon: Danie Binder, MD;  [facility];  Service: Endoscopy;;  (  colon)  . SAVORY DILATION N/A 06/30/2012   Procedure: SAVORY DILATION;  Surgeon: Danie Binder, MD;  Location: [facility];  Service: Endoscopy;  Laterality: N/A;  12.41m , 166m 1529m. SAVORY DILATION N/A 02/20/2016   Procedure: SAVORY DILATION;  Surgeon: SanDanie BinderD;  [facility];  Service: Endoscopy;  Laterality: N/A;  . UPPER GASTROINTESTINAL ENDOSCOPY  JAN 2009 ABD PAIN   GASTRITIS, ESO RING  . UPPER GASTROINTESTINAL ENDOSCOPY  OCT 2010 ABD PAIN, DYSPHAGIA   DIL 15MM, GASTRITIS, NL DUODENUM  . UPPER GASTROINTESTINAL ENDOSCOPY  OCT 2010 DYSPHAGIA   DIL 17 MM, GASTRITIS, NL DUODENUM  . vocal cord surgery  Sep 20, 2010   precancerous areas removed  .  wisdom tooth extraction Right    pin placement due to broken jaw    Prior to Admission medications   Medication Sig Start Date End Date Taking? Authorizing Provider  amLODipine (NORVASC) 5 MG tablet Take 1 tablet by mouth once daily Patient taking differently: Take 5 mg by mouth daily.  09/07/18  Yes [place], KawModena NunneryD  benzonatate (TESSALON) 200 MG capsule Take 200 mg by mouth 3 (three) times daily as needed for cough.   Yes [provider]  budesonide-formoterol (SYMBICORT) 80-4.5 MCG/ACT inhaler Inhale 2 puffs into the lungs 2 (two) times a day.  06/05/18  Yes [provider]  calcium carbonate (TUMS - DOSED IN MG ELEMENTAL CALCIUM) 500 MG chewable tablet Chew 1 tablet (200 mg of elemental calcium total) by mouth 3 (three) times daily with meals. Patient taking differently: Chew 1 tablet by mouth daily.  04/16/18  Yes Nida, GebMarella ChimesD  cholestyramine (QULucrezia Starch GM/DOSE powder DISSOLVE AND TAKE 2 GRAMS BY MOUTH TWICE DAILY WITH A MEAL- DO NOT TAKE WITHIN 2 HOURS OF OTHER MEDICATIONS Patient taking differently: Take 4 g by mouth daily with breakfast.  06/25/17  Yes LewMahala MenghiniA-C  clobetasol (TEMOVATE) 0.05 % external solution Apply 1 application topically 2 (two) times daily as needed (apply to scalp for itching).  10/17/18  Yes [provider]  cyclobenzaprine (FLEXERIL) 10 MG tablet Take 10 mg by mouth every 8 (eight) hours as needed for muscle spasms.   Yes [provider]  dexlansoprazole (DEXILANT) 60 MG capsule 1 PO EVERY MORNING WITH BREAKFAST. Patient taking differently: Take 60 mg by mouth daily with breakfast.  10/01/18  Yes Tarissa Kerin L, MD  diazepam (VALIUM) 5 MG tablet Take 5 mg by mouth every 12 (twelve) hours as needed for anxiety.   Yes [provider]  diphenhydrAMINE (BENADRYL) 25 MG tablet Take 25 mg by mouth at bedtime as needed for itching.   Yes [provider]  fluticasone (FLONASE) 50 MCG/ACT nasal  spray USE TWO SPRAY(S) IN EACH NOSTRIL ONCE DAILY AS NEEDED FOR ALLERGY OR RHINITIS Patient taking differently: Place 2 sprays into both nostrils daily as needed for allergies or rhinitis.  09/01/18  Yes [place], KawModena NunneryD  gabapentin (NEURONTIN) 400 MG capsule Take 1 capsule (400 mg total) by mouth 4 (four) times daily. 09/01/18  Yes [place], KawModena NunneryD  hydrocortisone cream 1 % Apply 1 application topically 2 (two) times daily as needed for itching.   Yes [provider]  lactase (LACTAID) 3000 units tablet Take 3,000 Units by mouth daily as needed (lactose intolerance).   Yes [provider]  Lidocaine HCl (ASPERCREME LIDOCAINE) 4 % LIQD Apply 1 application topically 3 (three) times daily as needed (  pain).   Yes [provider]  loratadine (CLARITIN) 10 MG tablet Take 1 tablet (10 mg total) by mouth daily. Patient taking differently: Take 10 mg by mouth daily as needed for allergies.  09/01/18  Yes [place], Modena Nunnery, MD  magnesium hydroxide (MILK OF MAGNESIA) 400 MG/5ML suspension Take 30 mLs by mouth daily as needed for mild constipation.   Yes [provider]  Menthol (BIOFREEZE) 10 % AERO Apply 1 application topically 3 (three) times daily as needed (pain).   Yes [provider]  miconazole (MONISTAT 7) 2 % vaginal cream Place 1 Applicatorful vaginally 3 (three) times daily.   Yes [provider]  mupirocin ointment (BACTROBAN) 2 % Apply 1 application topically 3 (three) times daily. Applies to leg wound 10/19/18  Yes [provider]  nystatin (MYCOSTATIN) 100000 UNIT/ML suspension TAKE  5 ML BY MOUTH 4 TIMES DAILY AS NEEDED FOR  THRUSH Patient taking differently: Take 5 mLs by mouth 4 (four) times daily.  09/07/18  Yes [place], Modena Nunnery, MD  oxyCODONE-acetaminophen (PERCOCET) 10-325 MG tablet Take 1 tablet by mouth every 6 (six) hours as needed for pain.  01/31/17  Yes [provider]  denosumab (PROLIA) 60 MG/ML SOSY  injection Inject 60 mg into the skin every 6 (six) months.    [provider]  VENTOLIN HFA 108 (90 Base) MCG/ACT inhaler Inhale 2 puffs into the lungs every 4 (four) hours as needed for wheezing or shortness of breath. INHALE 2 PUFFS BY MOUTH EVERY 4 HOURS AS NEEDED FOR WHEEZING Patient taking differently: Inhale 2 puffs into the lungs every 4 (four) hours as needed for wheezing or shortness of breath.  07/20/18   Alycia Rossetti, MD    Allergies as of 10/07/2018 - Review Complete 10/01/2018  Allergen Reaction Noted  . Naproxen Shortness Of Breath 01/09/2016  . Eggs or egg-derived products Diarrhea 02/20/2018  . Lovastatin Other (See Comments) 09/25/2010  . Toradol [ketorolac tromethamine] Other (See Comments) 02/20/2016  . Tramadol Other (See Comments) 02/20/2016  . Nexium [esomeprazole magnesium] Diarrhea 06/12/2012    Family History  Problem Relation Age of Onset  . Hypothyroidism Mother   . Heart disease Father   . Parkinson's disease Father   . Hypothyroidism Daughter   . Heart attack Paternal Grandfather   . Alzheimer's disease Paternal Grandmother   . Heart attack Maternal Grandfather   . Crohn's disease Sister   . Other Sister        was murdered  . Crohn's disease Maternal Uncle   . Colon cancer Neg Hx   . Colon polyps Neg Hx     Social History   Socioeconomic History  . Marital status: Single    Spouse name: [place]  . Number of children: 2  . Years of education: GED  . Highest education level: Not on file  Occupational History    Comment: disabled  Social Needs  . Financial resource strain: Not on file  . Food insecurity    Worry: Not on file    Inability: Not on file  . Transportation needs    Medical: Not on file    Non-medical: Not on file  Tobacco Use  . Smoking status: Current Every Day Smoker    Packs/day: 1.00    Years: 30.00    Pack years: 30.00    Types: Cigarettes  . Smokeless tobacco: Never Used  Substance and Sexual Activity  .  Alcohol use: Yes    Comment: "beer here and  there"  . Drug use: No  . Sexual activity: Not Currently    Birth control/protection: Post-menopausal  Lifestyle  . Physical activity    Days per week: Not on file    Minutes per session: Not on file  . Stress: Not on file  Relationships  . Social Herbalist on phone: Not on file    Gets together: Not on file    Attends religious service: Not on file    Active member of club or organization: Not on file    Attends meetings of clubs or organizations: Not on file    Relationship status: Not on file  . Intimate partner violence    Fear of current or ex partner: Not on file    Emotionally abused: Not on file    Physically abused: Not on file    Forced sexual activity: Not on file  Other Topics Concern  . Not on file  Social History Narrative   Lives with husband   no caffiene    Review of Systems: See HPI, otherwise negative ROS   Physical Exam: BP 115/74   Temp 98.7 F (37.1 C) (Oral)   Resp 12   Ht 5' (1.524 m)   Wt 47.1 kg   SpO2 97%   BMI 20.28 kg/m  General:   Alert,  pleasant and cooperative in NAD Head:  Normocephalic and atraumatic. Neck:  Supple; Lungs:  Clear throughout to auscultation.    Heart:  Regular rate and rhythm. Abdomen:  Soft, nontender and nondistended. Normal bowel sounds, without guarding, and without rebound.   Neurologic:  Alert and  oriented x4;  grossly normal neurologically.  Impression/Plan:     DYSPHAGIA  PLAN:  EGD/DIL TODAY.  DISCUSSED PROCEDURE, BENEFITS, & RISKS: < 1% chance of medication reaction, bleeding, perforation, or ASPIRATION.

## 2018-11-24 NOTE — Discharge Instructions (Signed)
I STRETCHED your esophagus. You have a small hiatal hernia. You have gastritis.    DRINK WATER TO KEEP YOUR URINE LIGHT YELLOW.  AVOID REFLUX TRIGGERS. SEE INFO BELOW.  CONTINUE DEXILANT.  FOLLOW UP APPT IN 6 MONTHS.    UPPER ENDOSCOPY AFTER CARE Read the instructions outlined below and refer to this sheet in the next week. These discharge instructions provide you with general information on caring for yourself after you leave the hospital. While your treatment has been planned according to the most current medical practices available, unavoidable complications occasionally occur. If you have any problems or questions after discharge, call DR. Marquel Spoto, [phone].  ACTIVITY  You may resume your regular activity, but move at a slower pace for the next 24 hours.   Take frequent rest periods for the next 24 hours.   Walking will help get rid of the air and reduce the bloated feeling in your belly (abdomen).   No driving for 24 hours (because of the medicine (anesthesia) used during the test).   You may shower.   Do not sign any important legal documents or operate any machinery for 24 hours (because of the anesthesia used during the test).    NUTRITION  Drink plenty of fluids.   You may resume your normal diet as instructed by your doctor.   Begin with a light meal and progress to your normal diet. Heavy or fried foods are harder to digest and may make you feel sick to your stomach (nauseated).   Avoid alcoholic beverages for 24 hours or as instructed.    MEDICATIONS  You may resume your normal medications.   WHAT YOU CAN EXPECT TODAY  Some feelings of bloating in the abdomen.   Passage of more gas than usual.    IF YOU HAD A BIOPSY TAKEN DURING THE UPPER ENDOSCOPY:  Eat a soft diet IF YOU HAVE NAUSEA, BLOATING, ABDOMINAL PAIN, OR VOMITING.    FINDING OUT THE RESULTS OF YOUR TEST Not all test results are available during your visit. DR. Oneida Alar WILL CALL YOU  WITHIN 7 DAYS OF YOUR PROCEDUE WITH YOUR RESULTS. Do not assume everything is normal if you have not heard from DR. Severn Goddard IN ONE WEEK, CALL HER OFFICE AT [phone].  SEEK IMMEDIATE MEDICAL ATTENTION AND CALL THE OFFICE: [phone] IF:  You have more than a spotting of blood in your stool.   Your belly is swollen (abdominal distention).   You are nauseated or vomiting.   You have a temperature over 101F.   You have abdominal pain or discomfort that is severe or gets worse throughout the day.  Lifestyle and home remedies TO CONTROL HEARTBURN/REFLUX  You may eliminate or reduce the frequency of heartburn by making the following lifestyle changes:   Control your weight. Being overweight is a major risk factor for heartburn and GERD. Excess pounds put pressure on your abdomen, pushing up your stomach and causing acid to back up into your esophagus.    Eat smaller meals. 4 TO 6 MEALS A DAY. This reduces pressure on the lower esophageal sphincter, helping to prevent the valve from opening and acid from washing back into your esophagus.    Loosen your belt. Clothes that fit tightly around your waist put pressure on your abdomen and the lower esophageal sphincter.    Eliminate heartburn triggers. Everyone has specific triggers. Common triggers such as fatty or fried foods, spicy food, tomato sauce, carbonated beverages, alcohol, chocolate, mint, garlic, onion, caffeine and nicotine may  make heartburn worse.    Avoid stooping or bending. Tying your shoes is OK. Bending over for longer periods to weed your garden isn't, especially soon after eating.    Don't lie down after a meal. Wait at least three to four hours after eating before going to bed, and don't lie down right after eating.   Alternative medicine  Several home remedies exist for treating GERD, but they provide only temporary relief. They include drinking baking soda (sodium bicarbonate) added to water or drinking other  fluids such as baking soda mixed with cream of tartar and water.  Although these liquids create temporary relief by neutralizing, washing away or buffering acids, eventually they aggravate the situation by adding gas and fluid to your stomach, increasing pressure and causing more acid reflux. Further, adding more sodium to your diet may increase your blood pressure and add stress to your heart, and excessive bicarbonate ingestion can alter the acid-base balance in your body.    ESOPHAGEAL STRICTURE  Esophageal strictures can be caused by stomach acid backing up into the tube that carries food from the mouth down to the stomach (lower esophagus).  TREATMENT There are a number of medicines used to treat reflux/stricture, including: Antacids.  PEPCID/TAGAMET Proton-pump inhibitors: DEXILANT

## 2018-11-25 ENCOUNTER — Encounter: Payer: Self-pay | Admitting: Obstetrics and Gynecology

## 2018-11-25 ENCOUNTER — Ambulatory Visit (INDEPENDENT_AMBULATORY_CARE_PROVIDER_SITE_OTHER): Payer: Medicare Other | Admitting: Obstetrics and Gynecology

## 2018-11-25 VITALS — BP 131/80 | HR 86 | Ht 60.0 in | Wt 106.4 lb

## 2018-11-25 DIAGNOSIS — N898 Other specified noninflammatory disorders of vagina: Secondary | ICD-10-CM

## 2018-11-25 LAB — POCT WET PREP (WET MOUNT)
Trichomonas Wet Prep HPF POC: ABSENT
WBC, Wet Prep HPF POC: POSITIVE

## 2018-11-25 MED ORDER — ESTRADIOL 0.1 MG/GM VA CREA: 0.2500 | TOPICAL_CREAM | VAGINAL | 3 refills | Status: DC

## 2018-11-25 NOTE — Progress Notes (Signed)
Patient ID: [name] [name], female   DOB: 18-Apr-1956, 63 y.o.   MRN: [id number]    [facility] Visit  @DATE @            Patient name: [name] [name] MRN [id number]  Date of birth: 1955-10-20  CC & HPI:  [name] [name] is a 63 y.o. female presenting today for vaginal itching. Hot and cold sweat, urinary frequency, insomnia. Believes toilet paper casued vaginal itching. On rectum there is area of skin lesion.  ROS:  ROS +insomnia +night sweats -fever  Pertinent History Reviewed:   Reviewed: Significant for back surgery Medical         Past Medical History:  Diagnosis Date  . Allergic rhinitis   . Anastomotic ulcer JAN 2009  . Anxiety   . Arthritis   . Asthma   . BMI (body mass index) 20.0-29.9 2009 121 lbs  . Concussion   . 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 Crohn disease ([place])   . Crohn disease ([place])   . CTS (carpal tunnel syndrome)   . Diarrhea MULITIFACTORIAL   IBS, LACTOSE INTOLERANCE, SBBO, BILE-SALT  . Elevated liver enzymes 2009: BMI 24 ?Etoh ALT 94, AST 51,  NEG ANA, qIGs& ASMA   MAY 2012 AST 52 ALT 38  . Esophageal stricture 2009  . GERD (gastroesophageal reflux disease)   . Hemorrhoid   . Hyperlipemia   . Hypertension   . Inflammatory bowel disease 2006 CD   SURGICAL REMISSION  . LBP (low back pain)   . Mental disorder   . Migraine   . Osteoporosis   . PONV (postoperative nausea and vomiting)   . Shortness of breath    exertion and humidity  . Shoulder pain    right  . Sleep apnea    Stop [name]                              Surgical Hx:    Past Surgical History:  Procedure Laterality Date  . BACK SURGERY    . BILATERAL SALPINGOOPHORECTOMY    . BIOPSY  12/24/2011   Procedure: BIOPSY;  Surgeon: Danie Binder, MD;  [facility]: [facility];  Service: Endoscopy;;  Gastric Biopsies  . BIOPSY N/A 06/30/2012   Procedure: BIOPSY;  Surgeon: Danie Binder, MD;  [facility]: [facility];  Service: Endoscopy;  Laterality: N/A;  Gastric and  Esophageal Biopsies  . CARPAL TUNNEL RELEASE  LEFT  . CATARACT EXTRACTION W/PHACO Right 07/30/2016   Procedure: CATARACT EXTRACTION PHACO AND INTRAOCULAR LENS PLACEMENT (IOC);  Surgeon: Rutherford Guys, MD;  Location: [facility];  Service: Ophthalmology;  Laterality: Right;  CDE: 5.45  . CATARACT EXTRACTION W/PHACO Left 08/13/2016   Procedure: CATARACT EXTRACTION PHACO AND INTRAOCULAR LENS PLACEMENT (IOC);  Surgeon: Rutherford Guys, MD;  [facility]: [facility];  Service: Ophthalmology;  Laterality: Left;  CDE: 5.59  . COLON SURGERY    . COLONOSCOPY  JAN 2009 DIARRHEA, ABD PAIN, BRBPR   ANASTOMOTIC ULCER(3MM), IH EN:MMHWKG ULCER, NL COLON bX  . COLONOSCOPY WITH PROPOFOL N/A 04/21/2018   Procedure: COLONOSCOPY WITH PROPOFOL;  Surgeon: Danie Binder, MD;  [facility];  Service: Endoscopy;  Laterality: N/A;  1:15pm  . DILATION AND CURETTAGE OF UTERUS    . ESOPHAGEAL DILATION  12/24/2011   SLF: A stricture was found in the distal esophagus/ Moderate gastritis/ MILD Duodenitis  . ESOPHAGOGASTRODUODENOSCOPY  02/07/09  mild gastritis  . ESOPHAGOGASTRODUODENOSCOPY (EGD) WITH PROPOFOL N/A 06/30/2012   SLF: 1. No definite stricture appreciated 2. small hiatal hernia 3. Gastritis  . ESOPHAGOGASTRODUODENOSCOPY (EGD) WITH PROPOFOL N/A 02/20/2016   Procedure: ESOPHAGOGASTRODUODENOSCOPY (EGD) WITH PROPOFOL;  Surgeon: Danie Binder, MD;  [facility];  Service: Endoscopy;  Laterality: N/A;  930  . EYE SURGERY    . gum flap Bilateral   . HEMICOLECTOMY  RIGHT 2006  . HERNIA REPAIR    . LUMBAR DISC SURGERY    . POLYPECTOMY  04/21/2018   Procedure: POLYPECTOMY;  Surgeon: Danie Binder, MD;  [facility];  Service: Endoscopy;;  (colon)  . SAVORY DILATION N/A 06/30/2012   Procedure: SAVORY DILATION;  Surgeon: Danie Binder, MD;  [facility];  Service: Endoscopy;  Laterality: N/A;  12.63m , 118m 1552m. SAVORY DILATION N/A 02/20/2016   Procedure: SAVORY DILATION;  Surgeon: SanDanie BinderD;  [facility];  Service: Endoscopy;  Laterality: N/A;  . UPPER GASTROINTESTINAL ENDOSCOPY  JAN 2009 ABD PAIN   GASTRITIS, ESO RING  . UPPER GASTROINTESTINAL ENDOSCOPY  OCT 2010 ABD PAIN, DYSPHAGIA   DIL 15MM, GASTRITIS, NL DUODENUM  . UPPER GASTROINTESTINAL ENDOSCOPY  OCT 2010 DYSPHAGIA   DIL 17 MM, GASTRITIS, NL DUODENUM  . vocal cord surgery  Sep 20, 2010   precancerous areas removed  . wisdom tooth extraction Right    pin placement due to broken jaw   Medications: Reviewed & Updated - see associated section                       Current Outpatient Medications:  .  amLODipine (NORVASC) 5 MG tablet, Take 1 tablet by mouth once daily (Patient taking differently: Take 5 mg by mouth daily. ), Disp: 90 tablet, Rfl: 0 .  benzonatate (TESSALON) 200 MG capsule, Take 200 mg by mouth 3 (three) times daily as needed for cough., Disp: , Rfl:  .  budesonide-formoterol (SYMBICORT) 80-4.5 MCG/ACT inhaler, Inhale 2 puffs into the lungs 2 (two) times a day. , Disp: , Rfl:  .  calcium carbonate (TUMS - DOSED IN MG ELEMENTAL CALCIUM) 500 MG chewable tablet, Chew 1 tablet (200 mg of elemental calcium total) by mouth 3 (three) times daily with meals. (Patient taking differently: Chew 1 tablet by mouth daily. ), Disp: 90 tablet, Rfl: 6 .  cholestyramine (QUESTRAN) 4 GM/DOSE powder, DISSOLVE AND TAKE 2 GRAMS BY MOUTH TWICE DAILY WITH A MEAL- DO NOT TAKE WITHIN 2 HOURS OF OTHER MEDICATIONS (Patient taking differently: Take 4 g by mouth daily with breakfast. ), Disp: 378 g, Rfl: 3 .  clobetasol (TEMOVATE) 0.05 % external solution, Apply 1 application topically 2 (two) times daily as needed (apply to scalp for itching). , Disp: , Rfl:  .  cyclobenzaprine (FLEXERIL) 10 MG tablet, Take 10 mg by mouth every 8 (eight) hours as needed for muscle spasms., Disp: , Rfl:  .  denosumab (PROLIA) 60 MG/ML SOSY injection, Inject 60 mg into the skin every 6 (six) months., Disp: , Rfl:  .  dexlansoprazole  (DEXILANT) 60 MG capsule, 1 PO EVERY MORNING WITH BREAKFAST. (Patient taking differently: Take 60 mg by mouth daily with breakfast. ), Disp: 30 capsule, Rfl: 11 .  diazepam (VALIUM) 5 MG tablet, Take 5 mg by mouth every 12 (twelve) hours as needed for anxiety., Disp: , Rfl:  .  diphenhydrAMINE (BENADRYL) 25 MG tablet, Take 25 mg by mouth  at bedtime as needed for itching., Disp: , Rfl:  .  fluticasone (FLONASE) 50 MCG/ACT nasal spray, USE TWO SPRAY(S) IN EACH NOSTRIL ONCE DAILY AS NEEDED FOR ALLERGY OR RHINITIS (Patient taking differently: Place 2 sprays into both nostrils daily as needed for allergies or rhinitis. ), Disp: 16 g, Rfl: 2 .  gabapentin (NEURONTIN) 400 MG capsule, Take 1 capsule (400 mg total) by mouth 4 (four) times daily., Disp: , Rfl:  .  hydrocortisone cream 1 %, Apply 1 application topically 2 (two) times daily as needed for itching., Disp: , Rfl:  .  lactase (LACTAID) 3000 units tablet, Take 3,000 Units by mouth daily as needed (lactose intolerance)., Disp: , Rfl:  .  Lidocaine HCl (ASPERCREME LIDOCAINE) 4 % LIQD, Apply 1 application topically 3 (three) times daily as needed (pain)., Disp: , Rfl:  .  loratadine (CLARITIN) 10 MG tablet, Take 1 tablet (10 mg total) by mouth daily. (Patient taking differently: Take 10 mg by mouth daily as needed for allergies. ), Disp: 30 tablet, Rfl: 3 .  magnesium hydroxide (MILK OF MAGNESIA) 400 MG/5ML suspension, Take 30 mLs by mouth daily as needed for mild constipation., Disp: , Rfl:  .  Menthol (BIOFREEZE) 10 % AERO, Apply 1 application topically 3 (three) times daily as needed (pain)., Disp: , Rfl:  .  miconazole (MONISTAT 7) 2 % vaginal cream, Place 1 Applicatorful vaginally 3 (three) times daily., Disp: , Rfl:  .  mupirocin ointment (BACTROBAN) 2 %, Apply 1 application topically 3 (three) times daily. Applies to leg wound, Disp: , Rfl:  .  nystatin (MYCOSTATIN) 100000 UNIT/ML suspension, TAKE  5 ML BY MOUTH 4 TIMES DAILY AS NEEDED FOR  THRUSH  (Patient taking differently: Take 5 mLs by mouth 4 (four) times daily. ), Disp: 500 mL, Rfl: 0 .  oxyCODONE-acetaminophen (PERCOCET) 10-325 MG tablet, Take 1 tablet by mouth every 6 (six) hours as needed for pain. , Disp: , Rfl:  .  VENTOLIN HFA 108 (90 Base) MCG/ACT inhaler, Inhale 2 puffs into the lungs every 4 (four) hours as needed for wheezing or shortness of breath. INHALE 2 PUFFS BY MOUTH EVERY 4 HOURS AS NEEDED FOR WHEEZING (Patient taking differently: Inhale 2 puffs into the lungs every 4 (four) hours as needed for wheezing or shortness of breath. ), Disp: 18 g, Rfl: 3   Social History: Reviewed -  reports that she has been smoking cigarettes. She has a 30.00 pack-year smoking history. She has never used smokeless tobacco.  Objective Findings:  Vitals: There were no vitals taken for this visit.  PHYSICAL EXAMINATION General appearance - alert, well appearing, and in no distress Mental status - alert but erractic, oriented to person, place, and time, normal mood, behavior, speech, dress, motor activity, and thought processes, affect appropriate to mood Skin - Declamation 1 cm firm hard area of almost if burn mark but isn't   PELVIC External genitalia - well defined labia minora pale in color and atrophic Vagina - atrophic Cervix - normal Uterus - not examined Wet Prep: epithelial with some basal, and white cells no clue   Assessment & Plan:   A:  1.  atrophic vaginitis postmenopausal status  P:  1.  estrace VC three times /wk    By signing my name below, I, Samul Dada, attest that this documentation has been prepared under the direction and in the presence of Jonnie Kind, MD. Electronically Signed: [place]. 11/25/18. 3:30 PM.  I personally performed the  services described in this documentation, which was SCRIBED in my presence. The recorded information has been reviewed and considered accurate. It has been edited as necessary during review. Jonnie Kind, MD

## 2018-12-01 ENCOUNTER — Encounter (HOSPITAL_COMMUNITY): Payer: Self-pay | Admitting: Gastroenterology

## 2018-12-07 ENCOUNTER — Other Ambulatory Visit: Payer: Self-pay | Admitting: Family Medicine

## 2018-12-07 ENCOUNTER — Ambulatory Visit (INDEPENDENT_AMBULATORY_CARE_PROVIDER_SITE_OTHER): Payer: Medicare Other | Admitting: Adult Health

## 2018-12-07 ENCOUNTER — Encounter: Payer: Self-pay | Admitting: Adult Health

## 2018-12-07 ENCOUNTER — Other Ambulatory Visit: Payer: Self-pay

## 2018-12-07 VITALS — BP 130/82 | HR 77 | Ht 60.0 in | Wt 111.5 lb

## 2018-12-07 DIAGNOSIS — L292 Pruritus vulvae: Secondary | ICD-10-CM

## 2018-12-07 NOTE — Progress Notes (Signed)
Patient ID: [name] [name], female   DOB: 04/26/1956, 63 y.o.   MRN: [id number] History of Present Illness: [name] is a 63 year old white female, PM in complaining of vulva itching.She saw Dr Glo Herring 11/25/2018 and was prescribed estrace cream, it has not helped. PCP is Dr Buelah Manis.    Current Medications, Allergies, Past Medical History, Past Surgical History, Family History and Social History were reviewed in [facility] record.     Review of Systems: +vulva itching for over 2 months, has tried [employer] and estrace with no relief     Physical Exam: BP 130/82 (BP Location: Left Arm, Patient Position: Sitting, Cuff Size: Normal)   Pulse 77   Ht 5' (1.524 m)   Wt 111 lb 8 oz (50.6 kg)   BMI 21.78 kg/m  General:  Well developed, well nourished, no acute distress Skin:  Warm and dry Pelvic:  External genitalia is red, swollen and irritated. The vagina is atrophic, painted vulva and vagina with gentian violet. Psych:  No mood changes, alert and cooperative,seems happy Fall risk is low. Examination chaperoned by Levy Pupa LPN.  Impression: 1. Vulvar itching       Plan: Stop using in creams on vulva or vagina for now Gently pat dry when goes to bathroom Wash with plain water, no soap and pat dry Rerun in 2 weeks to see Dr Glo Herring in my absence and he may paint with gentian violet again

## 2018-12-08 MED ORDER — BUDESONIDE-FORMOTEROL FUMARATE 80-4.5 MCG/ACT IN AERO: 2.0000 | INHALATION_SPRAY | Freq: Two times a day (BID) | RESPIRATORY_TRACT | 0 refills | Status: DC

## 2018-12-08 NOTE — Addendum Note (Signed)
Addended by: Sheral Flow on: 12/08/2018 08:51 AM   Modules accepted: Orders

## 2018-12-14 ENCOUNTER — Telehealth: Payer: Self-pay | Admitting: Obstetrics and Gynecology

## 2018-12-14 ENCOUNTER — Other Ambulatory Visit: Payer: Self-pay | Admitting: Women's Health

## 2018-12-14 NOTE — Telephone Encounter (Signed)
Pt states that she saw Anderson Malta last week and she put the purple medication on her vaginal area but the itching is now coming back and she would like to see what she could use.

## 2018-12-21 ENCOUNTER — Telehealth: Payer: Self-pay | Admitting: Obstetrics and Gynecology

## 2018-12-21 NOTE — Telephone Encounter (Signed)
Patient informed we are still not allowing any visitors or children to come in during appointment time unless physical assistance is needed. Asked if has had any exposure to anyone suspected or confirmed of having COVID-19 or if she was experiencing any of the following, to reschedule: fever, cough, shortness of breath, muscle pain, diarrhea, rash, vomiting, abdominal pain, red eye, weakness, bruising, bleeding, joint pain, or a severe headache.  Stated no to all.  Advised to also use the provided hand sanitizer when entering the office and to wear a mask if she has one, if not, we will provide one. Pt verbalized understanding.

## 2018-12-22 ENCOUNTER — Other Ambulatory Visit: Payer: Self-pay

## 2018-12-22 ENCOUNTER — Encounter: Payer: Self-pay | Admitting: Obstetrics and Gynecology

## 2018-12-22 ENCOUNTER — Ambulatory Visit: Payer: Medicare Other | Admitting: Obstetrics and Gynecology

## 2018-12-22 VITALS — Ht 60.0 in | Wt 111.8 lb

## 2018-12-22 NOTE — Progress Notes (Signed)
[facility] Visit  @DATE @            Patient name: [name] [name] MRN [id number]  Date of birth: November 25, 1955  CC & HPI:  [name] [name] is a 63 y.o. female presenting today for gyn f/u. She was placed in the room, and seemed frustrated from the arrival. She had to wait a bit, til she could be seen, and pt chose to leave. She did not engage in conversation as she left.    ROS:  ROS   Pertinent History Reviewed:   Reviewed: Significant for edical         Past Medical History:  Diagnosis Date  . Allergic rhinitis   . Anastomotic ulcer JAN 2009  . Anxiety   . Arthritis   . Asthma   . BMI (body mass index) 20.0-29.9 2009 121 lbs  . Concussion   . 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 Crohn disease ([place])   . Crohn disease ([place])   . CTS (carpal tunnel syndrome)   . Diarrhea MULITIFACTORIAL   IBS, LACTOSE INTOLERANCE, SBBO, BILE-SALT  . Elevated liver enzymes 2009: BMI 24 ?Etoh ALT 94, AST 51,  NEG ANA, qIGs& ASMA   MAY 2012 AST 52 ALT 38  . Esophageal stricture 2009  . GERD (gastroesophageal reflux disease)   . Hemorrhoid   . Hyperlipemia   . Hypertension   . Inflammatory bowel disease 2006 CD   SURGICAL REMISSION  . LBP (low back pain)   . Mental disorder   . Migraine   . Osteoporosis   . PONV (postoperative nausea and vomiting)   . Shortness of breath    exertion and humidity  . Shoulder pain    right  . Sleep apnea    Stop [name]                             Surgical Hx:    Past Surgical History:  Procedure Laterality Date  . BACK SURGERY    . BACK SURGERY  07/16/2018  . BILATERAL SALPINGOOPHORECTOMY    . BIOPSY  12/24/2011   Procedure: BIOPSY;  Surgeon: Danie Binder, MD;  [facility];  Service: Endoscopy;;  Gastric Biopsies  . BIOPSY N/A 06/30/2012   Procedure: BIOPSY;  Surgeon: Danie Binder, MD;  [facility];  Service: Endoscopy;  Laterality: N/A;  Gastric and Esophageal Biopsies  . CARPAL TUNNEL RELEASE  LEFT  .  CATARACT EXTRACTION W/PHACO Right 07/30/2016   Procedure: CATARACT EXTRACTION PHACO AND INTRAOCULAR LENS PLACEMENT (IOC);  Surgeon: Rutherford Guys, MD;  [facility];  Service: Ophthalmology;  Laterality: Right;  CDE: 5.45  . CATARACT EXTRACTION W/PHACO Left 08/13/2016   Procedure: CATARACT EXTRACTION PHACO AND INTRAOCULAR LENS PLACEMENT (IOC);  Surgeon: Rutherford Guys, MD;  [facility];  Service: Ophthalmology;  Laterality: Left;  CDE: 5.59  . COLON SURGERY    . COLONOSCOPY  JAN 2009 DIARRHEA, ABD PAIN, BRBPR   ANASTOMOTIC ULCER(3MM), IH EY:CXKGYJ ULCER, NL COLON bX  . COLONOSCOPY WITH PROPOFOL N/A 04/21/2018   Procedure: COLONOSCOPY WITH PROPOFOL;  Surgeon: Danie Binder, MD;  [facility];  Service: Endoscopy;  Laterality: N/A;  1:15pm  . DILATION AND CURETTAGE OF UTERUS    . ESOPHAGEAL DILATION  12/24/2011   SLF: A stricture was found in the distal esophagus/ Moderate gastritis/ MILD Duodenitis  . ESOPHAGOGASTRODUODENOSCOPY  02/07/09  mild gastritis  . ESOPHAGOGASTRODUODENOSCOPY (EGD) WITH PROPOFOL N/A 06/30/2012   SLF: 1. No definite stricture appreciated 2. small hiatal hernia 3. Gastritis  . ESOPHAGOGASTRODUODENOSCOPY (EGD) WITH PROPOFOL N/A 02/20/2016   Procedure: ESOPHAGOGASTRODUODENOSCOPY (EGD) WITH PROPOFOL;  Surgeon: Danie Binder, MD;  [facility];  Service: Endoscopy;  Laterality: N/A;  930  . ESOPHAGOGASTRODUODENOSCOPY (EGD) WITH PROPOFOL N/A 11/24/2018   Procedure: ESOPHAGOGASTRODUODENOSCOPY (EGD) WITH PROPOFOL;  Surgeon: Danie Binder, MD;  [facility];  Service: Endoscopy;  Laterality: N/A;  2:45pm  . EYE SURGERY    . gum flap Bilateral   . HEMICOLECTOMY  RIGHT 2006  . HERNIA REPAIR    . LUMBAR DISC SURGERY    . POLYPECTOMY  04/21/2018   Procedure: POLYPECTOMY;  Surgeon: Danie Binder, MD;  [facility];  Service: Endoscopy;;  (colon)  . SAVORY DILATION N/A 06/30/2012   Procedure: SAVORY DILATION;  Surgeon: Danie Binder, MD;  [facility];  Service: Endoscopy;  Laterality: N/A;  12.71m , 132m 1563m. SAVORY DILATION N/A 02/20/2016   Procedure: SAVORY DILATION;  Surgeon: SanDanie BinderD;  [facility];  Service: Endoscopy;  Laterality: N/A;  . SAVORY DILATION N/A 11/24/2018   Procedure: SAVORY DILATION;  Surgeon: FieDanie BinderD;  [facility];  Service: Endoscopy;  Laterality: N/A;  . UPPER GASTROINTESTINAL ENDOSCOPY  JAN 2009 ABD PAIN   GASTRITIS, ESO RING  . UPPER GASTROINTESTINAL ENDOSCOPY  OCT 2010 ABD PAIN, DYSPHAGIA   DIL 15MM, GASTRITIS, NL DUODENUM  . UPPER GASTROINTESTINAL ENDOSCOPY  OCT 2010 DYSPHAGIA   DIL 17 MM, GASTRITIS, NL DUODENUM  . vocal cord surgery  Sep 20, 2010   precancerous areas removed  . wisdom tooth extraction Right    pin placement due to broken jaw   Medications: Reviewed & Updated - see associated section                       Current Outpatient Medications:  .  amLODipine (NORVASC) 5 MG tablet, Take 1 tablet (5 mg total) by mouth daily., Disp: 90 tablet, Rfl: 2 .  budesonide-formoterol (SYMBICORT) 80-4.5 MCG/ACT inhaler, Inhale 2 puffs into the lungs 2 (two) times daily., Disp: 11 g, Rfl: 0 .  calcium carbonate (TUMS - DOSED IN MG ELEMENTAL CALCIUM) 500 MG chewable tablet, Chew 1 tablet (200 mg of elemental calcium total) by mouth 3 (three) times daily with meals. (Patient taking differently: Chew 1 tablet by mouth daily. ), Disp: 90 tablet, Rfl: 6 .  cholestyramine (QUESTRAN) 4 GM/DOSE powder, DISSOLVE AND TAKE 2 GRAMS BY MOUTH TWICE DAILY WITH A MEAL- DO NOT TAKE WITHIN 2 HOURS OF OTHER MEDICATIONS (Patient taking differently: Take 4 g by mouth daily with breakfast. ), Disp: 378 g, Rfl: 3 .  clobetasol (TEMOVATE) 0.05 % external solution, Apply 1 application topically 2 (two) times daily as needed (apply to scalp for itching). , Disp: , Rfl:  .  cyclobenzaprine (FLEXERIL) 10 MG tablet, Take 10 mg by mouth every 8 (eight) hours as needed  for muscle spasms., Disp: , Rfl:  .  denosumab (PROLIA) 60 MG/ML SOSY injection, Inject 60 mg into the skin every 6 (six) months., Disp: , Rfl:  .  dexlansoprazole (DEXILANT) 60 MG capsule, 1 PO EVERY MORNING WITH BREAKFAST. (Patient taking differently: Take 60 mg by mouth daily with breakfast. ), Disp: 30 capsule, Rfl: 11 .  diazepam (VALIUM) 5 MG tablet,  Take 5 mg by mouth every 12 (twelve) hours as needed for anxiety., Disp: , Rfl:  .  estradiol (ESTRACE VAGINAL) 0.1 MG/GM vaginal cream, Place 7.16 Applicatorfuls vaginally 3 (three) times a week. This 0.5 g/ application, Disp: 96.7 g, Rfl: 3 .  fluticasone (FLONASE) 50 MCG/ACT nasal spray, USE TWO SPRAY(S) IN EACH NOSTRIL ONCE DAILY AS NEEDED FOR ALLERGY OR RHINITIS (Patient taking differently: Place 2 sprays into both nostrils daily as needed for allergies or rhinitis. ), Disp: 16 g, Rfl: 2 .  gabapentin (NEURONTIN) 400 MG capsule, Take 1 capsule (400 mg total) by mouth 4 (four) times daily., Disp: , Rfl:  .  hydrocortisone cream 1 %, Apply 1 application topically 2 (two) times daily as needed for itching., Disp: , Rfl:  .  lactase (LACTAID) 3000 units tablet, Take 3,000 Units by mouth daily as needed (lactose intolerance)., Disp: , Rfl:  .  Lidocaine HCl (ASPERCREME LIDOCAINE) 4 % LIQD, Apply 1 application topically 3 (three) times daily as needed (pain)., Disp: , Rfl:  .  loratadine (CLARITIN) 10 MG tablet, Take 1 tablet (10 mg total) by mouth daily. (Patient taking differently: Take 10 mg by mouth daily as needed for allergies. ), Disp: 30 tablet, Rfl: 3 .  magnesium hydroxide (MILK OF MAGNESIA) 400 MG/5ML suspension, Take 30 mLs by mouth daily as needed for mild constipation., Disp: , Rfl:  .  Menthol (BIOFREEZE) 10 % AERO, Apply 1 application topically 3 (three) times daily as needed (pain)., Disp: , Rfl:  .  miconazole (MONISTAT 7) 2 % vaginal cream, Place 1 Applicatorful vaginally 3 (three) times daily., Disp: , Rfl:  .  mupirocin ointment  (BACTROBAN) 2 %, Apply 1 application topically 3 (three) times daily. Applies to leg wound, Disp: , Rfl:  .  nystatin (MYCOSTATIN) 100000 UNIT/ML suspension, TAKE  5 ML BY MOUTH 4 TIMES DAILY AS NEEDED FOR  THRUSH (Patient taking differently: Take 5 mLs by mouth 4 (four) times daily. ), Disp: 500 mL, Rfl: 0 .  oxyCODONE-acetaminophen (PERCOCET) 10-325 MG tablet, Take 1 tablet by mouth every 6 (six) hours as needed for pain. , Disp: , Rfl:  .  UNABLE TO FIND, Capsule to rebuild cells-BID, Disp: , Rfl:  .  VENTOLIN HFA 108 (90 Base) MCG/ACT inhaler, Inhale 2 puffs into the lungs every 4 (four) hours as needed for wheezing or shortness of breath. INHALE 2 PUFFS BY MOUTH EVERY 4 HOURS AS NEEDED FOR WHEEZING (Patient taking differently: Inhale 2 puffs into the lungs every 4 (four) hours as needed for wheezing or shortness of breath. ), Disp: 18 g, Rfl: 3 .  benzonatate (TESSALON) 200 MG capsule, Take 200 mg by mouth 3 (three) times daily as needed for cough., Disp: , Rfl:  .  diphenhydrAMINE (BENADRYL) 25 MG tablet, Take 25 mg by mouth at bedtime as needed for itching., Disp: , Rfl:    Social History: Reviewed -  reports that she has been smoking cigarettes. She has a 30.00 pack-year smoking history. She has never used smokeless tobacco.  Objective Findings:  Vitals: Height 5' (1.524 m), weight 111 lb 12.8 oz (50.7 kg).  No exam done  Assessment & Plan:   A:  1.  Pt left w/o being seen.  P:  1. Will await to hear from pt.

## 2018-12-23 ENCOUNTER — Telehealth: Payer: Self-pay | Admitting: Obstetrics and Gynecology

## 2018-12-23 NOTE — Telephone Encounter (Signed)
Patient informed we are still not allowing any visitors or children to come in during appointment time unless physical assistance is needed. Asked if has had any exposure to anyone suspected or confirmed of having COVID-19 or if she was experiencing any of the following, to reschedule: fever, cough, shortness of breath, muscle pain, diarrhea, rash, vomiting, abdominal pain, red eye, weakness, bruising, bleeding, joint pain, or a severe headache.  Stated no to all.  Advised to also use the provided hand sanitizer when entering the office and to wear a mask if she has one, if not, we will provide one. Pt verbalized understanding.

## 2018-12-24 ENCOUNTER — Encounter: Payer: Self-pay | Admitting: Obstetrics and Gynecology

## 2018-12-24 ENCOUNTER — Ambulatory Visit (INDEPENDENT_AMBULATORY_CARE_PROVIDER_SITE_OTHER): Payer: Medicare Other | Admitting: Obstetrics and Gynecology

## 2018-12-24 ENCOUNTER — Other Ambulatory Visit: Payer: Self-pay

## 2018-12-24 VITALS — BP 160/96 | HR 84 | Ht 60.0 in | Wt 110.6 lb

## 2018-12-24 DIAGNOSIS — L292 Pruritus vulvae: Secondary | ICD-10-CM

## 2018-12-24 DIAGNOSIS — L723 Sebaceous cyst: Secondary | ICD-10-CM | POA: Diagnosis not present

## 2018-12-24 MED ORDER — CEPHALEXIN 500 MG PO CAPS: 500.0000 mg | ORAL_CAPSULE | Freq: Four times a day (QID) | ORAL | 0 refills | Status: DC

## 2018-12-24 NOTE — Progress Notes (Signed)
Patient ID: SCHAE CANDO, female   DOB: January 21, 1956, 63 y.o.   MRN: [id number]    [facility] Visit  @DATE @            Patient name: [name] [name] MRN [id number]  Date of birth: [birth year]  CC & HPI:  [name] [name] is a 63 y.o. female presenting today for vaginal irritation. Seen 11/25/2018 by Dr.Ferg and was prescribed estrace with no relief. Lenell Antu NP 12/07/2018 a with paint gentian violet. Itching is many on the right side, itching has been going on since July. 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   Crohn disease ([place])    Crohn disease ([place])    CTS (carpal tunnel syndrome)    Diarrhea MULITIFACTORIAL   IBS, LACTOSE INTOLERANCE, SBBO, BILE-SALT   Elevated liver enzymes 2009: BMI 24 ?Etoh ALT 94, AST 51,  NEG ANA, qIGs& ASMA   MAY 2012 AST 52 ALT 38   Esophageal stricture 2009   GERD (gastroesophageal reflux disease)    Hemorrhoid    Hyperlipemia    Hypertension    Inflammatory bowel disease 2006 CD   SURGICAL REMISSION   LBP (low back pain)    Mental disorder    Migraine    Osteoporosis    PONV (postoperative nausea and vomiting)    Shortness of breath    exertion and humidity   Shoulder pain    right   Sleep apnea    Stop Bang Cock score of 4                              Surgical Hx:    Past Surgical History:  Procedure Laterality Date   BACK SURGERY     BACK SURGERY  07/16/2018   BILATERAL SALPINGOOPHORECTOMY     BIOPSY  12/24/2011   Procedure: BIOPSY;  Surgeon: Danie Binder, MD;  Location: [facility];  Service: Endoscopy;;  Gastric Biopsies   BIOPSY N/A 06/30/2012    Procedure: BIOPSY;  Surgeon: Danie Binder, MD;  Location: [facility];  Service: Endoscopy;  Laterality: N/A;  Gastric and Esophageal Biopsies   CARPAL TUNNEL RELEASE  LEFT   CATARACT EXTRACTION W/PHACO Right 07/30/2016   Procedure: CATARACT EXTRACTION PHACO AND INTRAOCULAR LENS PLACEMENT ([place]);  Surgeon: Rutherford Guys, MD;  Location: [facility];  Service: Ophthalmology;  Laterality: Right;  CDE: 5.45   CATARACT EXTRACTION W/PHACO Left 08/13/2016   Procedure: CATARACT EXTRACTION PHACO AND INTRAOCULAR LENS PLACEMENT ([facility]);  Surgeon: Rutherford Guys, MD;  Location: [facility];  Service: Ophthalmology;  Laterality: Left;  CDE: 5.59   COLON SURGERY     COLONOSCOPY  JAN 2009 DIARRHEA, ABD PAIN, BRBPR   ANASTOMOTIC ULCER(3MM), IH VZ:CHYIFO ULCER, NL COLON bX   COLONOSCOPY WITH PROPOFOL N/A 04/21/2018   Procedure: COLONOSCOPY WITH PROPOFOL;  Surgeon: Danie Binder, MD;  [facility];  Service: Endoscopy;  Laterality: N/A;  1:15pm   DILATION AND CURETTAGE OF  UTERUS     ESOPHAGEAL DILATION  12/24/2011   SLF: A stricture was found in the distal esophagus/ Moderate gastritis/ MILD Duodenitis   ESOPHAGOGASTRODUODENOSCOPY  02/07/09   mild gastritis   ESOPHAGOGASTRODUODENOSCOPY (EGD) WITH PROPOFOL N/A 06/30/2012   SLF: 1. No definite stricture appreciated 2. small hiatal hernia 3. Gastritis   ESOPHAGOGASTRODUODENOSCOPY (EGD) WITH PROPOFOL N/A 02/20/2016   Procedure: ESOPHAGOGASTRODUODENOSCOPY (EGD) WITH PROPOFOL;  Surgeon: Danie Binder, MD;  [facility];  Service: Endoscopy;  Laterality: N/A;  930   ESOPHAGOGASTRODUODENOSCOPY (EGD) WITH PROPOFOL N/A 11/24/2018   Procedure: ESOPHAGOGASTRODUODENOSCOPY (EGD) WITH PROPOFOL;  Surgeon: Danie Binder, MD;  [facility];  Service: Endoscopy;  Laterality: N/A;  2:45pm   EYE SURGERY     gum flap Bilateral    HEMICOLECTOMY  RIGHT 2006   HERNIA REPAIR     LUMBAR DISC SURGERY     POLYPECTOMY  04/21/2018   Procedure: POLYPECTOMY;   Surgeon: Danie Binder, MD;  [facility];  Service: Endoscopy;;  (colon)   SAVORY DILATION N/A 06/30/2012   Procedure: SAVORY DILATION;  Surgeon: Danie Binder, MD;  [facility]: [facility];  Service: Endoscopy;  Laterality: N/A;  12.57m , 139m 1556m SAVORY DILATION N/A 02/20/2016   Procedure: SAVORY DILATION;  Surgeon: SanDanie BinderD;  [facility];  Service: Endoscopy;  Laterality: N/A;   SAVORY DILATION N/A 11/24/2018   Procedure: SAVORY DILATION;  Surgeon: FieDanie BinderD;  [facility];  Service: Endoscopy;  Laterality: N/A;   UPPER GASTROINTESTINAL ENDOSCOPY  JAN 2009 ABD PAIN   GASTRITIS, ESO RING   UPPER GASTROINTESTINAL ENDOSCOPY  OCT 2010 ABD PAIN, DYSPHAGIA   DIL 15MM, GASTRITIS, NL DUODENUM   UPPER GASTROINTESTINAL ENDOSCOPY  OCT 2010 DYSPHAGIA   DIL 17 MM, GASTRITIS, NL DUODENUM   vocal cord surgery  Sep 20, 2010   precancerous areas removed   wisdom tooth extraction Right    pin placement due to broken jaw   Medications: Reviewed & Updated - see associated section                       Current Outpatient Medications:    amLODipine (NORVASC) 5 MG tablet, Take 1 tablet (5 mg total) by mouth daily., Disp: 90 tablet, Rfl: 2   benzonatate (TESSALON) 200 MG capsule, Take 200 mg by mouth 3 (three) times daily as needed for cough., Disp: , Rfl:    budesonide-formoterol (SYMBICORT) 80-4.5 MCG/ACT inhaler, Inhale 2 puffs into the lungs 2 (two) times daily., Disp: 11 g, Rfl: 0   calcium carbonate (TUMS - DOSED IN MG ELEMENTAL CALCIUM) 500 MG chewable tablet, Chew 1 tablet (200 mg of elemental calcium total) by mouth 3 (three) times daily with meals. (Patient taking differently: Chew 1 tablet by mouth daily. ), Disp: 90 tablet, Rfl: 6   cholestyramine (QUESTRAN) 4 GM/DOSE powder, DISSOLVE AND TAKE 2 GRAMS BY MOUTH TWICE DAILY WITH A MEAL- DO NOT TAKE WITHIN 2 HOURS OF OTHER MEDICATIONS (Patient taking differently: Take 4 g by mouth daily  with breakfast. ), Disp: 378 g, Rfl: 3   clobetasol (TEMOVATE) 0.05 % external solution, Apply 1 application topically 2 (two) times daily as needed (apply to scalp for itching). , Disp: , Rfl:    cyclobenzaprine (FLEXERIL) 10 MG tablet, Take 10 mg by mouth every 8 (eight) hours as needed for muscle spasms., Disp: , Rfl:    denosumab (PROLIA)  60 MG/ML SOSY injection, Inject 60 mg into the skin every 6 (six) months., Disp: , Rfl:    dexlansoprazole (DEXILANT) 60 MG capsule, 1 PO EVERY MORNING WITH BREAKFAST. (Patient taking differently: Take 60 mg by mouth daily with breakfast. ), Disp: 30 capsule, Rfl: 11   diazepam (VALIUM) 5 MG tablet, Take 5 mg by mouth every 12 (twelve) hours as needed for anxiety., Disp: , Rfl:    diphenhydrAMINE (BENADRYL) 25 MG tablet, Take 25 mg by mouth at bedtime as needed for itching., Disp: , Rfl:    estradiol (ESTRACE VAGINAL) 0.1 MG/GM vaginal cream, Place 1.61 Applicatorfuls vaginally 3 (three) times a week. This 0.5 g/ application, Disp: 09.6 g, Rfl: 3   fluticasone (FLONASE) 50 MCG/ACT nasal spray, USE TWO SPRAY(S) IN EACH NOSTRIL ONCE DAILY AS NEEDED FOR ALLERGY OR RHINITIS (Patient taking differently: Place 2 sprays into both nostrils daily as needed for allergies or rhinitis. ), Disp: 16 g, Rfl: 2   gabapentin (NEURONTIN) 400 MG capsule, Take 1 capsule (400 mg total) by mouth 4 (four) times daily., Disp: , Rfl:    hydrocortisone cream 1 %, Apply 1 application topically 2 (two) times daily as needed for itching., Disp: , Rfl:    lactase (LACTAID) 3000 units tablet, Take 3,000 Units by mouth daily as needed (lactose intolerance)., Disp: , Rfl:    Lidocaine HCl (ASPERCREME LIDOCAINE) 4 % LIQD, Apply 1 application topically 3 (three) times daily as needed (pain)., Disp: , Rfl:    loratadine (CLARITIN) 10 MG tablet, Take 1 tablet (10 mg total) by mouth daily. (Patient taking differently: Take 10 mg by mouth daily as needed for allergies. ), Disp: 30 tablet,  Rfl: 3   magnesium hydroxide (MILK OF MAGNESIA) 400 MG/5ML suspension, Take 30 mLs by mouth daily as needed for mild constipation., Disp: , Rfl:    Menthol (BIOFREEZE) 10 % AERO, Apply 1 application topically 3 (three) times daily as needed (pain)., Disp: , Rfl:    miconazole (MONISTAT 7) 2 % vaginal cream, Place 1 Applicatorful vaginally 3 (three) times daily., Disp: , Rfl:    mupirocin ointment (BACTROBAN) 2 %, Apply 1 application topically 3 (three) times daily. Applies to leg wound, Disp: , Rfl:    nystatin (MYCOSTATIN) 100000 UNIT/ML suspension, TAKE  5 ML BY MOUTH 4 TIMES DAILY AS NEEDED FOR  THRUSH (Patient taking differently: Take 5 mLs by mouth 4 (four) times daily. ), Disp: 500 mL, Rfl: 0   oxyCODONE-acetaminophen (PERCOCET) 10-325 MG tablet, Take 1 tablet by mouth every 6 (six) hours as needed for pain. , Disp: , Rfl:    UNABLE TO FIND, Capsule to rebuild cells-BID, Disp: , Rfl:    VENTOLIN HFA 108 (90 Base) MCG/ACT inhaler, Inhale 2 puffs into the lungs every 4 (four) hours as needed for wheezing or shortness of breath. INHALE 2 PUFFS BY MOUTH EVERY 4 HOURS AS NEEDED FOR WHEEZING (Patient taking differently: Inhale 2 puffs into the lungs every 4 (four) hours as needed for wheezing or shortness of breath. ), Disp: 18 g, Rfl: 3   Social History: Reviewed -  reports that she has been smoking cigarettes. She has a 30.00 pack-year smoking history. She has never used smokeless tobacco.  Objective Findings:  Vitals: Blood pressure (!) 160/96, pulse 84, height 5' (1.524 m), weight 110 lb 9.6 oz (50.2 kg).  PHYSICAL EXAMINATION General appearance - alert, well appearing, and in no distress Mental status - alert, oriented to person, place, and time, normal mood, behavior,  speech, dress, motor activity, and thought processes, affect appropriate to mood  PELVIC Vulva - right labia majora of 1.5 cmdiscrete cluster of hard sebaceous cys Vagina - not examined Cervix - not examined   Uterus - not examined    Assessment & Plan:   A:  1.  Vulvar itching 2. Vulvar lesion, will excise in 2 wk after antibiotic course.  P:  1.  Rx antiobiotics Keflex x 7d 2. 2 weeks vulvar excision lesion   By signing my name below, I, Samul Dada, attest that this documentation has been prepared under the direction and in the presence of Jonnie Kind, MD. Electronically Signed: [place]. 12/24/18. 9:31 AM.  I personally performed the services described in this documentation, which was SCRIBED in my presence. The recorded information has been reviewed and considered accurate. It has been edited as necessary during review. Jonnie Kind, MD

## 2018-12-31 ENCOUNTER — Other Ambulatory Visit: Payer: Self-pay | Admitting: Family Medicine

## 2019-01-05 ENCOUNTER — Telehealth: Payer: Self-pay | Admitting: Obstetrics and Gynecology

## 2019-01-05 NOTE — Telephone Encounter (Signed)
Called patient regarding appointment scheduled in our office encouraged to come alone to the visit if possible, however, a support person, over age 61, may accompany her  to appointment if assistance is needed for safety or care concerns. Otherwise, support persons should remain outside until the visit is complete.   We ask if you have had any exposure to anyone suspected or confirmed of having COVID-19 or if you are experiencing any of the following, to call and reschedule your appointment: fever, cough, shortness of breath, muscle pain, diarrhea, rash, vomiting, abdominal pain, red eye, weakness, bruising, bleeding, joint pain, or a severe headache.   Please know we will ask you these questions or similar questions when you arrive for your appointment and again its how we are keeping everyone safe.    Also,to keep you safe, please use the provided hand sanitizer when you enter the office. We are asking everyone in the office to wear a mask to help prevent the spread of germs. If you have a mask of your own, please wear it to your appointment, if not, we are happy to provide one for you.  Thank you for understanding and your cooperation.    [facility] Staff

## 2019-01-06 ENCOUNTER — Encounter: Payer: Self-pay | Admitting: Obstetrics and Gynecology

## 2019-01-06 ENCOUNTER — Ambulatory Visit (INDEPENDENT_AMBULATORY_CARE_PROVIDER_SITE_OTHER): Payer: Medicare Other | Admitting: Obstetrics and Gynecology

## 2019-01-06 ENCOUNTER — Other Ambulatory Visit: Payer: Self-pay

## 2019-01-06 VITALS — BP 140/90 | HR 88 | Ht 60.0 in | Wt 110.0 lb

## 2019-01-06 DIAGNOSIS — L723 Sebaceous cyst: Secondary | ICD-10-CM | POA: Diagnosis not present

## 2019-01-06 NOTE — Progress Notes (Signed)
Patient ID: [name] [name], female   DOB: 1955/11/18, 63 y.o.   MRN: [id number]   [name] [name]  @DATE @            Patient name: [name] [name] MRN [id number]  Date of birth: 1955/09/12  CC & HPI:  [name] [name] is a 63 y.o. female presenting today for vulvar biopsy, removal of sebaceous cyst that responded to recent antiboitics and is now 3 mm comedone  patient declined procedure of removal  ROS:  ROS +sebaceous cyst  Pertinent History Reviewed:   Reviewed: Significant for  Medical         Past Medical History:  Diagnosis Date   Allergic rhinitis    Anastomotic ulcer JAN 2009   Anxiety    Arthritis    Asthma    BMI (body mass index) 20.0-29.9 2009 121 lbs   Concussion    COPD with asthma ([place]) MAR 2011 PFTS   Crohn disease ([place])    Crohn disease ([place])    CTS (carpal tunnel syndrome)    Diarrhea MULITIFACTORIAL   IBS, LACTOSE INTOLERANCE, SBBO, BILE-SALT   Elevated liver enzymes 2009: BMI 24 ?Etoh ALT 94, AST 51,  NEG ANA, qIGs& ASMA   MAY 2012 AST 52 ALT 38   Esophageal stricture 2009   GERD (gastroesophageal reflux disease)    Hemorrhoid    Hyperlipemia    Hypertension    Inflammatory bowel disease 2006 CD   SURGICAL REMISSION   LBP (low back pain)    Mental disorder    Migraine    Osteoporosis    PONV (postoperative nausea and vomiting)    Shortness of breath    exertion and humidity   Shoulder pain    right   Sleep apnea    Stop Bang Cock score of 4                              Surgical Hx:    Past Surgical History:  Procedure Laterality Date   BACK SURGERY     BACK SURGERY  07/16/2018   BILATERAL SALPINGOOPHORECTOMY     BIOPSY  12/24/2011   Procedure: BIOPSY;  Surgeon: Danie Binder, MD;  [facility];  Service: Endoscopy;;  Gastric Biopsies   BIOPSY N/A 06/30/2012   Procedure: BIOPSY;  Surgeon: Danie Binder, MD;  Location: [facility];  Service: Endoscopy;  Laterality: N/A;  Gastric and Esophageal Biopsies    CARPAL TUNNEL RELEASE  LEFT   CATARACT EXTRACTION W/PHACO Right 07/30/2016   Procedure: CATARACT EXTRACTION PHACO AND INTRAOCULAR LENS PLACEMENT ([place]);  Surgeon: Rutherford Guys, MD;  Location: [facility];  Service: Ophthalmology;  Laterality: Right;  CDE: 5.45   CATARACT EXTRACTION W/PHACO Left 08/13/2016   Procedure: CATARACT EXTRACTION PHACO AND INTRAOCULAR LENS PLACEMENT ([facility]);  Surgeon: Rutherford Guys, MD;  [facility];  Service: Ophthalmology;  Laterality: Left;  CDE: 5.59   COLON SURGERY     COLONOSCOPY  JAN 2009 DIARRHEA, ABD PAIN, BRBPR   ANASTOMOTIC ULCER(3MM), IH ES:LPNPYY ULCER, NL COLON bX   COLONOSCOPY WITH PROPOFOL N/A 04/21/2018   Procedure: COLONOSCOPY WITH PROPOFOL;  Surgeon: Danie Binder, MD;  [facility];  Service: Endoscopy;  Laterality: N/A;  1:15pm   DILATION AND CURETTAGE OF UTERUS     ESOPHAGEAL DILATION  12/24/2011   SLF: A stricture was found in the distal esophagus/ Moderate gastritis/ MILD Duodenitis   ESOPHAGOGASTRODUODENOSCOPY  02/07/09   mild gastritis   ESOPHAGOGASTRODUODENOSCOPY (EGD) WITH PROPOFOL N/A 06/30/2012   SLF: 1. No definite stricture appreciated 2. small hiatal hernia 3. Gastritis   ESOPHAGOGASTRODUODENOSCOPY (EGD) WITH PROPOFOL N/A 02/20/2016   Procedure: ESOPHAGOGASTRODUODENOSCOPY (EGD) WITH PROPOFOL;  Surgeon: Danie Binder, MD;  [facility];  Service: Endoscopy;  Laterality: N/A;  930   ESOPHAGOGASTRODUODENOSCOPY (EGD) WITH PROPOFOL N/A 11/24/2018   Procedure: ESOPHAGOGASTRODUODENOSCOPY (EGD) WITH PROPOFOL;  Surgeon: Danie Binder, MD;  [facility];  Service: Endoscopy;  Laterality: N/A;  2:45pm   EYE SURGERY     gum flap Bilateral    HEMICOLECTOMY  RIGHT 2006   HERNIA REPAIR     LUMBAR DISC SURGERY     POLYPECTOMY  04/21/2018   Procedure: POLYPECTOMY;  Surgeon: Danie Binder, MD;  [facility];  Service: Endoscopy;;  (colon)   SAVORY DILATION N/A 06/30/2012   Procedure:  SAVORY DILATION;  Surgeon: Danie Binder, MD;  [facility];  Service: Endoscopy;  Laterality: N/A;  12.45m , 137m 153m SAVORY DILATION N/A 02/20/2016   Procedure: SAVORY DILATION;  Surgeon: SanDanie BinderD;  [facility];  Service: Endoscopy;  Laterality: N/A;   SAVORY DILATION N/A 11/24/2018   Procedure: SAVORY DILATION;  Surgeon: FieDanie BinderD;  [facility];  Service: Endoscopy;  Laterality: N/A;   UPPER GASTROINTESTINAL ENDOSCOPY  JAN 2009 ABD PAIN   GASTRITIS, ESO RING   UPPER GASTROINTESTINAL ENDOSCOPY  OCT 2010 ABD PAIN, DYSPHAGIA   DIL 15MM, GASTRITIS, NL DUODENUM   UPPER GASTROINTESTINAL ENDOSCOPY  OCT 2010 DYSPHAGIA   DIL 17 MM, GASTRITIS, NL DUODENUM   vocal cord surgery  Sep 20, 2010   precancerous areas removed   wisdom tooth extraction Right    pin placement due to broken jaw   Medications: Reviewed & Updated - see associated section                       Current Outpatient Medications:    amLODipine (NORVASC) 5 MG tablet, Take 1 tablet (5 mg total) by mouth daily., Disp: 90 tablet, Rfl: 2   benzonatate (TESSALON) 200 MG capsule, Take 200 mg by mouth 3 (three) times daily as needed for cough., Disp: , Rfl:    budesonide-formoterol (SYMBICORT) 80-4.5 MCG/ACT inhaler, Inhale 2 puffs into the lungs 2 (two) times daily., Disp: 11 g, Rfl: 0   calcium carbonate (TUMS - DOSED IN MG ELEMENTAL CALCIUM) 500 MG chewable tablet, Chew 1 tablet (200 mg of elemental calcium total) by mouth 3 (three) times daily with meals. (Patient taking differently: Chew 1 tablet by mouth daily. ), Disp: 90 tablet, Rfl: 6   cephALEXin (KEFLEX) 500 MG capsule, Take 1 capsule (500 mg total) by mouth 4 (four) times daily., Disp: 28 capsule, Rfl: 0   cholestyramine (QUESTRAN) 4 GM/DOSE powder, DISSOLVE AND TAKE 2 GRAMS BY MOUTH TWICE DAILY WITH A MEAL- DO NOT TAKE WITHIN 2 HOURS OF OTHER MEDICATIONS (Patient taking differently: Take 4 g by mouth daily with  breakfast. ), Disp: 378 g, Rfl: 3   clobetasol (TEMOVATE) 0.05 % external solution, Apply 1 application topically 2 (two) times daily as needed (apply to scalp for itching). , Disp: , Rfl:    cyclobenzaprine (FLEXERIL) 10 MG tablet, Take 10 mg by mouth every 8 (eight) hours as needed for muscle spasms., Disp: , Rfl:    denosumab (PROLIA) 60 MG/ML SOSY injection,  Inject 60 mg into the skin every 6 (six) months., Disp: , Rfl:    dexlansoprazole (DEXILANT) 60 MG capsule, 1 PO EVERY MORNING WITH BREAKFAST. (Patient taking differently: Take 60 mg by mouth daily with breakfast. ), Disp: 30 capsule, Rfl: 11   diazepam (VALIUM) 5 MG tablet, Take 5 mg by mouth every 12 (twelve) hours as needed for anxiety., Disp: , Rfl:    diphenhydrAMINE (BENADRYL) 25 MG tablet, Take 25 mg by mouth at bedtime as needed for itching., Disp: , Rfl:    estradiol (ESTRACE VAGINAL) 0.1 MG/GM vaginal cream, Place 7.86 Applicatorfuls vaginally 3 (three) times a week. This 0.5 g/ application, Disp: 76.7 g, Rfl: 3   fluticasone (FLONASE) 50 MCG/ACT nasal spray, USE TWO SPRAY(S) IN EACH NOSTRIL ONCE DAILY AS NEEDED FOR ALLERGY OR RHINITIS (Patient taking differently: Place 2 sprays into both nostrils daily as needed for allergies or rhinitis. ), Disp: 16 g, Rfl: 2   gabapentin (NEURONTIN) 400 MG capsule, Take 1 capsule (400 mg total) by mouth 4 (four) times daily., Disp: , Rfl:    hydrocortisone cream 1 %, Apply 1 application topically 2 (two) times daily as needed for itching., Disp: , Rfl:    lactase (LACTAID) 3000 units tablet, Take 3,000 Units by mouth daily as needed (lactose intolerance)., Disp: , Rfl:    Lidocaine HCl (ASPERCREME LIDOCAINE) 4 % LIQD, Apply 1 application topically 3 (three) times daily as needed (pain)., Disp: , Rfl:    loratadine (CLARITIN) 10 MG tablet, Take 1 tablet (10 mg total) by mouth daily. (Patient taking differently: Take 10 mg by mouth daily as needed for allergies. ), Disp: 30 tablet, Rfl:  3   magnesium hydroxide (MILK OF MAGNESIA) 400 MG/5ML suspension, Take 30 mLs by mouth daily as needed for mild constipation., Disp: , Rfl:    Menthol (BIOFREEZE) 10 % AERO, Apply 1 application topically 3 (three) times daily as needed (pain)., Disp: , Rfl:    miconazole (MONISTAT 7) 2 % vaginal cream, Place 1 Applicatorful vaginally 3 (three) times daily., Disp: , Rfl:    mupirocin ointment (BACTROBAN) 2 %, Apply 1 application topically 3 (three) times daily. Applies to leg wound, Disp: , Rfl:    nystatin (MYCOSTATIN) 100000 UNIT/ML suspension, TAKE 5 ML BY MOUTH  4 TIMES DAILY AS NEEDED FOR  THRUSH, Disp: 500 mL, Rfl: 0   oxyCODONE-acetaminophen (PERCOCET) 10-325 MG tablet, Take 1 tablet by mouth every 6 (six) hours as needed for pain. , Disp: , Rfl:    UNABLE TO FIND, Capsule to rebuild cells-BID, Disp: , Rfl:    VENTOLIN HFA 108 (90 Base) MCG/ACT inhaler, Inhale 2 puffs into the lungs every 4 (four) hours as needed for wheezing or shortness of breath. INHALE 2 PUFFS BY MOUTH EVERY 4 HOURS AS NEEDED FOR WHEEZING (Patient taking differently: Inhale 2 puffs into the lungs every 4 (four) hours as needed for wheezing or shortness of breath. ), Disp: 18 g, Rfl: 3   Social History: Reviewed -  reports that she has been smoking cigarettes. She has a 30.00 pack-year smoking history. She has never used smokeless tobacco.  Objective Findings:  Vitals: There were no vitals taken for this [name].  PHYSICAL EXAMINATION General appearance - alert, well appearing, and in no distress Mental status - alert, oriented to person, place, and time, normal mood, behavior, speech, dress, motor activity, and thought processes, affect appropriate to mood  PELVIC Patient has decided not to have biopsy done, due to relief of antibiotics  Vulva- Small 3 mm sebaceous cyst, right labia majora  Assessment & Plan:   A:  1.  Small 3 mm sebaceous cyst on right labia majora, improved   P:  1.  PRN   By signing  my name below, I, Samul Dada, attest that this documentation has been prepared under the direction and in the presence of Jonnie Kind, MD. Electronically Signed: [place]. 01/06/19. 12:11 PM.  I personally performed the services described in this documentation, which was SCRIBED in my presence. The recorded information has been reviewed and considered accurate. It has been edited as necessary during review. Jonnie Kind, MD

## 2019-01-12 DIAGNOSIS — M542 Cervicalgia: Secondary | ICD-10-CM | POA: Diagnosis not present

## 2019-01-12 DIAGNOSIS — M545 Low back pain: Secondary | ICD-10-CM | POA: Diagnosis not present

## 2019-01-12 DIAGNOSIS — Z79891 Long term (current) use of opiate analgesic: Secondary | ICD-10-CM | POA: Diagnosis not present

## 2019-01-12 DIAGNOSIS — R1084 Generalized abdominal pain: Secondary | ICD-10-CM | POA: Diagnosis not present

## 2019-01-12 DIAGNOSIS — G894 Chronic pain syndrome: Secondary | ICD-10-CM | POA: Diagnosis not present

## 2019-02-17 ENCOUNTER — Ambulatory Visit: Payer: Medicare Other | Admitting: Family Medicine

## 2019-02-22 ENCOUNTER — Ambulatory Visit (INDEPENDENT_AMBULATORY_CARE_PROVIDER_SITE_OTHER): Payer: Medicare Other | Admitting: Family Medicine

## 2019-02-22 ENCOUNTER — Other Ambulatory Visit: Payer: Self-pay

## 2019-02-22 ENCOUNTER — Encounter: Payer: Self-pay | Admitting: Gastroenterology

## 2019-02-22 ENCOUNTER — Encounter: Payer: Self-pay | Admitting: Family Medicine

## 2019-02-22 VITALS — BP 134/68 | HR 90 | Temp 98.4°F | Resp 14 | Ht 60.0 in | Wt 114.0 lb

## 2019-02-22 DIAGNOSIS — Z23 Encounter for immunization: Secondary | ICD-10-CM | POA: Diagnosis not present

## 2019-02-22 DIAGNOSIS — I10 Essential (primary) hypertension: Secondary | ICD-10-CM | POA: Diagnosis not present

## 2019-02-22 DIAGNOSIS — E559 Vitamin D deficiency, unspecified: Secondary | ICD-10-CM

## 2019-02-22 DIAGNOSIS — M810 Age-related osteoporosis without current pathological fracture: Secondary | ICD-10-CM | POA: Diagnosis not present

## 2019-02-22 DIAGNOSIS — J410 Simple chronic bronchitis: Secondary | ICD-10-CM

## 2019-02-22 DIAGNOSIS — E782 Mixed hyperlipidemia: Secondary | ICD-10-CM | POA: Diagnosis not present

## 2019-02-22 NOTE — Progress Notes (Signed)
   Subjective:    Patient ID: [name] [name], female    DOB: 01/31/1956, 63 y.o.   MRN: [id number]  Patient presents for Follow-up (is not fasting)  Pt here to f/u chronic medical problems  HTN- taking BP meds as prescribed, no concern, norvasc   Was seeing Dermatology and GYN due to vulvar itching and sebaceous cyst    COPD- using symbicort / tessalon perrles she has not had any recent exacerbations.   Chronic pain- given pain meds by Pain management    GERD/GASTRITIS/HIATAL HERNIA- taking dexilant she states that she does get some right upper quadrant pain on and off she can discuss this with her GI.   Osteoporosis- on prolia she needs her PTH and vitamin D levels drawn       Review Of Systems:  GEN- denies fatigue, fever, weight loss,weakness, recent illness HEENT- denies eye drainage, change in vision, nasal discharge, CVS- denies chest pain, palpitations RESP- denies SOB, cough, wheeze ABD- denies N/V, change in stools, abd pain GU- denies dysuria, hematuria, dribbling, incontinence MSK- + joint pain, muscle aches, injury Neuro- denies headache, dizziness, syncope, seizure activity       Objective:    BP 134/68   Pulse 90   Temp 98.4 F (36.9 C) (Oral)   Resp 14   Ht 5' (1.524 m)   Wt 114 lb (51.7 kg)   SpO2 96%   BMI 22.26 kg/m  GEN- NAD, alert and oriented x3 HEENT- PERRL, EOMI, non injected sclera, pink conjunctiva, MMM, oropharynx clear Neck- Supple, no thyromegaly CVS- RRR, no murmur RESP-CTAB ABD-NABS,soft,NT,ND EXT- No edema Pulses- Radial, DP- 2+        Assessment & Plan:      Problem List Items Addressed This Visit      Unprioritized   Chronic bronchitis ([place])   Essential hypertension - Primary   Hyperlipidemia   Osteoporosis      Note: This dictation was prepared with Dragon dictation along with smaller phrase technology. Any transcriptional errors that result from this process are unintentional.

## 2019-02-22 NOTE — Patient Instructions (Addendum)
Return for fasting labs this week We will call with lab results  Flu shot done  Call Dr. Oneida Alar about your pain  F/U 4 months for Physical

## 2019-02-23 ENCOUNTER — Encounter: Payer: Self-pay | Admitting: Family Medicine

## 2019-02-23 DIAGNOSIS — G894 Chronic pain syndrome: Secondary | ICD-10-CM | POA: Diagnosis not present

## 2019-02-23 DIAGNOSIS — R1084 Generalized abdominal pain: Secondary | ICD-10-CM | POA: Diagnosis not present

## 2019-02-23 DIAGNOSIS — M545 Low back pain: Secondary | ICD-10-CM | POA: Diagnosis not present

## 2019-02-23 DIAGNOSIS — Z79891 Long term (current) use of opiate analgesic: Secondary | ICD-10-CM | POA: Diagnosis not present

## 2019-02-23 DIAGNOSIS — M542 Cervicalgia: Secondary | ICD-10-CM | POA: Diagnosis not present

## 2019-02-23 NOTE — Assessment & Plan Note (Signed)
Continue with Prolia injection.  Will obtain vitamin D level and her PTH level she is following with endocrinology

## 2019-02-23 NOTE — Assessment & Plan Note (Signed)
Overall she is doing well on her current regimen.

## 2019-02-23 NOTE — Assessment & Plan Note (Signed)
Blood pressure is well controlled.  No change in medications.  She will return for fasting labs including lipid panel.

## 2019-02-23 NOTE — Progress Notes (Signed)
Referring Provider: Alycia Rossetti, MD Primary Care Physician:  Alycia Rossetti, MD Primary GI Physician: Dr. Oneida Alar  Chief Complaint  Patient presents with   Abdominal Pain    hernia    HPI:   [name] [name] is a 63 y.o. female presenting today for follow-up as well as acute complaint of RUQ abdominal pain. GI history of GERD, dysphagia, and  Crohn's disease s/p right hemicolectomy and ileal resection in 2006. She was last seen via telemedicine visit on 10/01/18 for GERD, dysphagia, and Crohn's disease.  Overall patient reported her symptoms were fairly well controlled.  Plans to continue current medications and follow-up in 6 months.  Patient called office back on 10/05/2018 reporting increased concern about swallowing difficulty and rash.  Patient was advised to follow-up with PCP on rash and was scheduled for EGD with possible dilation on 11/24/2018.  EGD with benign-appearing esophageal peptic stricture status post dilation, small hiatal hernia, moderate erosive gastritis.  Recommendations to follow a low-fat diet and continue present medications.  Follow-up in 6 months.  Last colonoscopy on 04/21/2018 with examined portion of the ileum normal, two 3-5 mm polyps, diverticulosis in the rectosigmoid and sigmoid colon, external and internal hemorrhoids, significant looping of the colon.  Pathology with tubular adenomas.  Recommendations to follow high-fiber diet and repeat colonoscopy in 5 to 10 years with peds colonoscope.  Today she states she is having an ache in her RUQ. Comes and goes. First noticed it months ago. Was occurring every now and then. Now she has pain every day. Taking a deep breath will get a sharp pain at times. Bending over will cause pain. Mopping will cause pain. States she doesn't eat fatty foods. No identified food triggers. She will sit down when she has sharp pain, after a few minutes will resolve to dull aching pain. Can't get clear answer on long long this lasts.  No burning. Feels bloated at times. Pain medications help. No nausea or vomiting. GERD well controlled. Occasional dysphagia to large pills. No other abdominal pain. Will get abdominal pain before a BM if she has diarrhea. Abdominal pain resolves after BM. BMs daily. Alternates from loose stools to regular soft formed stools. No blood in the stool. No black stools. She has gained about 10 lbs since July.   No NSAIDs. Tylenol in Percocet.  Taking TID. Drinks a little wine a couple times a week. No drug use.   No fever or chills.  Denies lightheadedness, dizziness, feeling like she will pass out.   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   Crohn disease ([place])    Crohn disease ([place])    CTS (carpal tunnel syndrome)    Diarrhea MULITIFACTORIAL   IBS, LACTOSE INTOLERANCE, SBBO, BILE-SALT   Elevated liver enzymes 2009: BMI 24 ?Etoh ALT 94, AST 51,  NEG ANA, qIGs& ASMA   MAY 2012 AST 52 ALT 38   Esophageal stricture 2009   GERD (gastroesophageal reflux disease)    Hemorrhoid    Hyperlipemia    Hypertension    Inflammatory bowel disease 2006 CD   SURGICAL REMISSION   LBP (low back pain)    Mental disorder    Migraine    Osteoporosis  PONV (postoperative nausea and vomiting)    Shortness of breath    exertion and humidity   Shoulder pain    right   Sleep apnea    Stop Bang Cock score of 4    Past Surgical History:  Procedure Laterality Date   BACK SURGERY     BACK SURGERY  07/16/2018   BILATERAL SALPINGOOPHORECTOMY     BIOPSY  12/24/2011   Procedure: BIOPSY;  Surgeon: Danie Binder, MD;  Location: [facility];  Service: Endoscopy;;  Gastric Biopsies   BIOPSY N/A 06/30/2012   Procedure: BIOPSY;  Surgeon: Danie Binder, MD;  Location: [facility];  Service: Endoscopy;   Laterality: N/A;  Gastric and Esophageal Biopsies   CARPAL TUNNEL RELEASE  LEFT   CATARACT EXTRACTION W/PHACO Right 07/30/2016   Procedure: CATARACT EXTRACTION PHACO AND INTRAOCULAR LENS PLACEMENT ([place]);  Surgeon: Rutherford Guys, MD;  Location: [facility];  Service: Ophthalmology;  Laterality: Right;  CDE: 5.45   CATARACT EXTRACTION W/PHACO Left 08/13/2016   Procedure: CATARACT EXTRACTION PHACO AND INTRAOCULAR LENS PLACEMENT ([facility]);  Surgeon: Rutherford Guys, MD;  Location: [facility];  Service: Ophthalmology;  Laterality: Left;  CDE: 5.59   COLON SURGERY     COLONOSCOPY  JAN 2009 DIARRHEA, ABD PAIN, BRBPR   ANASTOMOTIC ULCER(3MM), IH ZJ:IRCVEL ULCER, NL COLON bX   COLONOSCOPY WITH PROPOFOL N/A 04/21/2018   PROPOFOL;  Surgeon: Danie Binder, MD; normal examined portion of the ileum, 2 tubular adenomas, diverticulosis in the rectosigmoid and sigmoid colon, external and internal hemorrhoids, significantly been of the colon.  Repeat colonoscopy in 5-10 years with peds colonoscope.   DILATION AND CURETTAGE OF UTERUS     ESOPHAGEAL DILATION  12/24/2011   SLF: A stricture was found in the distal esophagus/ Moderate gastritis/ MILD Duodenitis   ESOPHAGOGASTRODUODENOSCOPY  02/07/09   mild gastritis   ESOPHAGOGASTRODUODENOSCOPY (EGD) WITH PROPOFOL N/A 06/30/2012   SLF: 1. No definite stricture appreciated 2. small hiatal hernia 3. Gastritis   ESOPHAGOGASTRODUODENOSCOPY (EGD) WITH PROPOFOL N/A 02/20/2016   Procedure: ESOPHAGOGASTRODUODENOSCOPY (EGD) WITH PROPOFOL;  Surgeon: Danie Binder, MD;  [facility];  Service: Endoscopy;  Laterality: N/A;  930   ESOPHAGOGASTRODUODENOSCOPY (EGD) WITH PROPOFOL N/A 11/24/2018   PROPOFOL;  Surgeon: Danie Binder, MD; peptic stricture s/p dilation, small hiatal hernia, moderate erosive gastritis.   EYE SURGERY     gum flap Bilateral    HEMICOLECTOMY  RIGHT 2006   HERNIA REPAIR     LUMBAR DISC SURGERY     POLYPECTOMY  04/21/2018   Procedure:  POLYPECTOMY;  Surgeon: Danie Binder, MD;  [facility];  Service: Endoscopy;;  (colon)   SAVORY DILATION N/A 06/30/2012   Procedure: SAVORY DILATION;  Surgeon: Danie Binder, MD;  Location: [facility];  Service: Endoscopy;  Laterality: N/A;  12.17m , 165m 1540m SAVORY DILATION N/A 02/20/2016   Procedure: SAVORY DILATION;  Surgeon: SanDanie BinderD;  [facility];  Service: Endoscopy;  Laterality: N/A;   SAVORY DILATION N/A 11/24/2018   Procedure: SAVORY DILATION;  Surgeon: FieDanie BinderD;  [facility];  Service: Endoscopy;  Laterality: N/A;   UPPER GASTROINTESTINAL ENDOSCOPY  JAN 2009 ABD PAIN   GASTRITIS, ESO RING   UPPER GASTROINTESTINAL ENDOSCOPY  OCT 2010 ABD PAIN, DYSPHAGIA   DIL 15MM, GASTRITIS, NL DUODENUM   UPPER GASTROINTESTINAL ENDOSCOPY  OCT 2010 DYSPHAGIA   DIL 17 MM, GASTRITIS, NL DUODENUM   vocal cord surgery  Sep 20, 2010   precancerous areas removed   wisdom tooth extraction Right    pin placement due to broken jaw    Current Outpatient Medications  Medication Sig Dispense Refill   amLODipine (NORVASC) 5 MG tablet Take 1 tablet (5 mg total) by mouth daily. 90 tablet 2   benzonatate (TESSALON) 200 MG capsule Take 200 mg by mouth 3 (three) times daily as needed for cough.     calcium carbonate (TUMS - DOSED IN MG ELEMENTAL CALCIUM) 500 MG chewable tablet Chew 1 tablet (200 mg of elemental calcium total) by mouth 3 (three) times daily with meals. (Patient taking differently: Chew 1 tablet by mouth daily. ) 90 tablet 6   cholestyramine (QUESTRAN) 4 GM/DOSE powder DISSOLVE AND TAKE 2 GRAMS BY MOUTH TWICE DAILY WITH A MEAL- DO NOT TAKE WITHIN 2 HOURS OF OTHER MEDICATIONS (Patient taking differently: Take 4 g by mouth daily with breakfast. ) 378 g 3   cyclobenzaprine (FLEXERIL) 10 MG tablet Take 10 mg by mouth as needed for muscle spasms.      denosumab (PROLIA) 60 MG/ML SOSY injection Inject 60 mg into the skin every 6 (six)  months.     dexlansoprazole (DEXILANT) 60 MG capsule 1 PO EVERY MORNING WITH BREAKFAST. (Patient taking differently: Take 60 mg by mouth daily with breakfast. ) 30 capsule 11   diazepam (VALIUM) 5 MG tablet Take 5 mg by mouth every 12 (twelve) hours as needed for anxiety.     diphenhydrAMINE (BENADRYL) 25 MG tablet Take 25 mg by mouth as needed for itching.      estradiol (ESTRACE VAGINAL) 0.1 MG/GM vaginal cream Place 6.76 Applicatorfuls vaginally 3 (three) times a week. This 0.5 g/ application 19.5 g 3   fluticasone (FLONASE) 50 MCG/ACT nasal spray USE TWO SPRAY(S) IN EACH NOSTRIL ONCE DAILY AS NEEDED FOR ALLERGY OR RHINITIS (Patient taking differently: Place 2 sprays into both nostrils daily as needed for allergies or rhinitis. ) 16 g 2   gabapentin (NEURONTIN) 400 MG capsule Take 1 capsule (400 mg total) by mouth 4 (four) times daily.     lactase (LACTAID) 3000 units tablet Take 3,000 Units by mouth daily as needed (lactose intolerance).     Lidocaine HCl (ASPERCREME LIDOCAINE) 4 % LIQD Apply 1 application topically 3 (three) times daily as needed (pain).     loratadine (CLARITIN) 10 MG tablet Take 1 tablet (10 mg total) by mouth daily. (Patient taking differently: Take 10 mg by mouth daily as needed for allergies. ) 30 tablet 3   magnesium hydroxide (MILK OF MAGNESIA) 400 MG/5ML suspension Take 30 mLs by mouth daily as needed for mild constipation.     Menthol (BIOFREEZE) 10 % AERO Apply 1 application topically 3 (three) times daily as needed (pain).     nystatin (MYCOSTATIN) 100000 UNIT/ML suspension TAKE 5 ML BY MOUTH  4 TIMES DAILY AS NEEDED FOR  THRUSH 500 mL 0   oxyCODONE-acetaminophen (PERCOCET) 10-325 MG tablet Take 1 tablet by mouth every 6 (six) hours as needed for pain.      UNABLE TO FIND Capsule to rebuild cells-BID     UNABLE TO FIND as needed. Penetrex cream     VENTOLIN HFA 108 (90 Base) MCG/ACT inhaler Inhale 2 puffs into the lungs every 4 (four) hours as needed  for wheezing or shortness of breath. INHALE 2 PUFFS BY MOUTH EVERY 4 HOURS AS NEEDED FOR WHEEZING (Patient taking differently: Inhale 2 puffs into the lungs every 4 (four) hours  as needed for wheezing or shortness of breath. ) 18 g 3   SYMBICORT 80-4.5 MCG/ACT inhaler Inhale 2 puffs by mouth twice daily 11 g 0   No current facility-administered medications for this visit.     Allergies as of 02/24/2019 - Review Complete 02/24/2019  Allergen Reaction Noted   Naproxen Shortness Of Breath 01/09/2016   Eggs or egg-derived products Diarrhea 02/20/2018   Lovastatin Other (See Comments) 09/25/2010   Toradol [ketorolac tromethamine] Other (See Comments) 02/20/2016   Tramadol Other (See Comments) 02/20/2016   Nexium [esomeprazole magnesium] Diarrhea 06/12/2012    Family History  Problem Relation Age of Onset   Hypothyroidism Mother    Heart disease Father    Parkinson's disease Father    Hypothyroidism Daughter    Heart attack Paternal Grandfather    Alzheimer's disease Paternal Grandmother    Heart attack Maternal Grandfather    Crohn's disease Sister    Other Sister        was murdered   Crohn's disease Maternal Uncle    Colon cancer Neg Hx    Colon polyps Neg Hx     Social History   Socioeconomic History   Marital status: Single    Spouse name: Patrick Jupiter   Number of children: 2   Years of education: GED   Highest education level: Not on file  Occupational History    Comment: disabled  Scientist, product/process development strain: Not on file   Food insecurity    Worry: Not on file    Inability: Not on file   Transportation needs    Medical: Not on file    Non-medical: Not on file  Tobacco Use   Smoking status: Current Every Day Smoker    Packs/day: 0.25    Years: 30.00    Pack years: 7.50    Types: Cigarettes   Smokeless tobacco: Never Used  Substance and Sexual Activity   Alcohol use: Yes    Comment: wine a few times a week    Drug use: No    Sexual activity: Not Currently    Birth control/protection: Post-menopausal  Lifestyle   Physical activity    Days per week: Not on file    Minutes per session: Not on file   Stress: Not on file  Relationships   Social connections    Talks on phone: Not on file    Gets together: Not on file    Attends religious service: Not on file    Active member of club or organization: Not on file    Attends meetings of clubs or organizations: Not on file    Relationship status: Not on file  Other Topics Concern   Not on file  Social History Narrative   Lives with husband   no caffiene    Review of Systems: Gen: See HPI CV: Denies chest pain, palpitations Resp: Denies dyspnea at rest or cough. GI: See HPI Derm: Denies rash Psych: Denies depression.  Occasional anxiety. Heme: Denies bruising, bleeding  Physical Exam: BP 120/75    Pulse 96    Ht 5' 0.25" (1.53 m)    Wt 116 lb 6.4 oz (52.8 kg)    BMI 22.54 kg/m  General:   Alert and oriented. No distress noted. Pleasant and cooperative.  Head:  Normocephalic and atraumatic. Eyes:  Conjuctiva clear without scleral icterus. Heart:  S1, S2 present without murmurs appreciated. Lungs:  Clear to auscultation bilaterally. No wheezes, rales, or rhonchi. No distress.  Abdomen:  +  BS, soft, and non-distended.  Moderate to severe tenderness to palpation in the right upper quadrant.  No rebound or guarding. No HSM or masses noted. Msk:  Symmetrical without gross deformities. Normal posture. Extremities:  Without edema. Neurologic:  Alert and  oriented x4 Psych: Normal mood and affect.

## 2019-02-24 ENCOUNTER — Other Ambulatory Visit: Payer: Self-pay

## 2019-02-24 ENCOUNTER — Encounter: Payer: Self-pay | Admitting: Gastroenterology

## 2019-02-24 ENCOUNTER — Ambulatory Visit (INDEPENDENT_AMBULATORY_CARE_PROVIDER_SITE_OTHER): Payer: Medicare Other | Admitting: Gastroenterology

## 2019-02-24 VITALS — BP 120/75 | HR 96 | Ht 60.25 in | Wt 116.4 lb

## 2019-02-24 DIAGNOSIS — R1310 Dysphagia, unspecified: Secondary | ICD-10-CM

## 2019-02-24 DIAGNOSIS — K50019 Crohn's disease of small intestine with unspecified complications: Secondary | ICD-10-CM | POA: Diagnosis not present

## 2019-02-24 DIAGNOSIS — R1011 Right upper quadrant pain: Secondary | ICD-10-CM

## 2019-02-24 DIAGNOSIS — I10 Essential (primary) hypertension: Secondary | ICD-10-CM

## 2019-02-24 DIAGNOSIS — E782 Mixed hyperlipidemia: Secondary | ICD-10-CM

## 2019-02-24 DIAGNOSIS — M810 Age-related osteoporosis without current pathological fracture: Secondary | ICD-10-CM

## 2019-02-24 DIAGNOSIS — E559 Vitamin D deficiency, unspecified: Secondary | ICD-10-CM

## 2019-02-24 DIAGNOSIS — K219 Gastro-esophageal reflux disease without esophagitis: Secondary | ICD-10-CM

## 2019-02-24 DIAGNOSIS — R1319 Other dysphagia: Secondary | ICD-10-CM

## 2019-02-24 NOTE — Patient Instructions (Addendum)
Please have labs completed.   I am placing an order for [place] ASAP. You should receive a call with a date and time. In review of your previous labs, your liver enzymes are elevated. Please abstain from alcohol for now.   Further recommendations to follow.   Aliene Altes, PA-C [facility]

## 2019-02-24 NOTE — Patient Instructions (Signed)
No PA needed for [place] abd RUQ per [employer] website: NOTE: This [facility] member does not require prior authorization for OUTPATIENT Radiology through [place] or [place] [facility] at this time.

## 2019-02-25 ENCOUNTER — Other Ambulatory Visit: Payer: Self-pay | Admitting: Family Medicine

## 2019-02-26 ENCOUNTER — Ambulatory Visit (HOSPITAL_COMMUNITY)
Admission: RE | Admit: 2019-02-26 | Discharge: 2019-02-26 | Disposition: A | Discharge status: Home / Self Care | Payer: Medicare Other | Source: Ambulatory Visit | Attending: Gastroenterology | Admitting: Gastroenterology

## 2019-02-26 ENCOUNTER — Other Ambulatory Visit: Payer: Self-pay

## 2019-02-26 ENCOUNTER — Telehealth: Payer: Self-pay

## 2019-02-26 DIAGNOSIS — I10 Essential (primary) hypertension: Secondary | ICD-10-CM

## 2019-02-26 DIAGNOSIS — E782 Mixed hyperlipidemia: Secondary | ICD-10-CM | POA: Diagnosis not present

## 2019-02-26 DIAGNOSIS — R1011 Right upper quadrant pain: Secondary | ICD-10-CM | POA: Diagnosis not present

## 2019-02-26 DIAGNOSIS — E559 Vitamin D deficiency, unspecified: Secondary | ICD-10-CM | POA: Diagnosis not present

## 2019-02-26 DIAGNOSIS — K760 Fatty (change of) liver, not elsewhere classified: Secondary | ICD-10-CM | POA: Diagnosis not present

## 2019-02-26 DIAGNOSIS — M810 Age-related osteoporosis without current pathological fracture: Secondary | ICD-10-CM

## 2019-02-26 NOTE — Telephone Encounter (Signed)
[name] [name], CMA  

## 2019-02-28 ENCOUNTER — Encounter: Payer: Self-pay | Admitting: Gastroenterology

## 2019-02-28 NOTE — Assessment & Plan Note (Signed)
History of Crohn's s/p right hemicolectomy in 2006 with surgical remission.  She is on any chronic medications for Crohn's.  Overall, symptoms seem well controlled.  Bowels do fluctuate between diarrhea and soft formed stools.  Will have mild abdominal pain prior to diarrhea that resolves after a bowel movement.  Denies bright red blood per rectum or melena.  She has gained about 10 pounds since July.  She does have intermittent right upper quadrant pain as addressed below which I do not feel are related to Crohn's.    Continue to monitor symptoms.

## 2019-02-28 NOTE — Assessment & Plan Note (Signed)
GERD symptoms are well controlled.  Continue Dexilant 60 mg daily.

## 2019-02-28 NOTE — Assessment & Plan Note (Signed)
Symptoms improved after recent EGD with dilation on 11/24/2018 with findings of benign-appearing esophageal peptic stricture.  She does continue to have occasional dysphagia to large pills.  Continue to monitor symptoms.

## 2019-02-28 NOTE — Assessment & Plan Note (Addendum)
63 year old female with past medical history significant for GERD, Crohn's disease status post right hemicolectomy and ileal resection with surgical remission in 2006, elevated LFTs without explanation (negative ceruloplasmin, ANA, ASMA, immunoglobulins, hepatitis panel, and MRCP) with recommendations for liver biopsy in the past, and dysphagia secondary to peptic strictures who presents today with chief complaint of worsening intermittent right upper quadrant pain that has been present for several months.  Symptoms are now occurring daily.  Pain is sharp at times.  Identify triggers including taking a deep breath, bending over, or mopping.  No food triggers.  Sharp pain will resolve after sitting down to a dull aching pain.  No burning sensation.  Pain medications help.  GERD is well controlled.  Denies nausea, vomiting, BRBPR, melena, fever, or chills.  BMs daily.  Has gained about 10 pounds since July 2020.  No NSAIDs.  Exam with moderate to severe tenderness to palpation in the right upper quadrant.  EGD on file from July 2020 with peptic stricture, small hiatal hernia, moderate erosive gastritis.  Colonoscopy up-to-date December 2019.   As symptoms are typically brought on with movement or deep breaths, I'm suspicious of a musculoskeletal component.  However, with significant right upper quadrant pain on exam and history of elevated LFTs I am also concerned for possible gallbladder or hepatic etiology.  Doubt symptoms related to Crohn's disease as she does not have bright red blood per rectum, no significant change in bowel habits, and has been gaining weight.  Not consistent with gastritis or PUD.  Check RUQ ultrasound, CBC, and CMP. Further recommendations to follow.

## 2019-03-01 LAB — CBC WITH DIFFERENTIAL/PLATELET
Absolute Monocytes: 519 cells/uL (ref 200–950)
Basophils Absolute: 48 cells/uL (ref 0–200)
Basophils Relative: 0.9 %
Eosinophils Absolute: 191 cells/uL (ref 15–500)
Eosinophils Relative: 3.6 %
HCT: 42.6 % (ref 35.0–45.0)
Hemoglobin: 14.9 g/dL (ref 11.7–15.5)
Lymphs Abs: 811 cells/uL — ABNORMAL LOW (ref 850–3900)
MCH: 35.6 pg — ABNORMAL HIGH (ref 27.0–33.0)
MCHC: 35 g/dL (ref 32.0–36.0)
MCV: 101.9 fL — ABNORMAL HIGH (ref 80.0–100.0)
MPV: 10.5 fL (ref 7.5–12.5)
Monocytes Relative: 9.8 %
Neutro Abs: 3731 cells/uL (ref 1500–7800)
Neutrophils Relative %: 70.4 %
Platelets: 254 10*3/uL (ref 140–400)
RBC: 4.18 10*6/uL (ref 3.80–5.10)
RDW: 12.7 % (ref 11.0–15.0)
Total Lymphocyte: 15.3 %
WBC: 5.3 10*3/uL (ref 3.8–10.8)

## 2019-03-01 LAB — COMPLETE METABOLIC PANEL WITH GFR
AG Ratio: 1.6 (calc) (ref 1.0–2.5)
ALT: 130 U/L — ABNORMAL HIGH (ref 6–29)
AST: 110 U/L — ABNORMAL HIGH (ref 10–35)
Albumin: 4.4 g/dL (ref 3.6–5.1)
Alkaline phosphatase (APISO): 333 U/L — ABNORMAL HIGH (ref 37–153)
BUN/Creatinine Ratio: 16 (calc) (ref 6–22)
BUN: 7 mg/dL (ref 7–25)
CO2: 27 mmol/L (ref 20–32)
Calcium: 9.3 mg/dL (ref 8.6–10.4)
Chloride: 102 mmol/L (ref 98–110)
Creat: 0.43 mg/dL — ABNORMAL LOW (ref 0.50–0.99)
GFR, Est African American: 127 mL/min/{1.73_m2} (ref >=60)
GFR, Est Non African American: 110 mL/min/{1.73_m2} (ref >=60)
Globulin: 2.8 g/dL (calc) (ref 1.9–3.7)
Glucose, Bld: 81 mg/dL (ref 65–99)
Potassium: 3.8 mmol/L (ref 3.5–5.3)
Sodium: 140 mmol/L (ref 135–146)
Total Bilirubin: 0.4 mg/dL (ref 0.2–1.2)
Total Protein: 7.2 g/dL (ref 6.1–8.1)

## 2019-03-01 LAB — LIPID PANEL
Cholesterol: 267 mg/dL — ABNORMAL HIGH (ref <200)
HDL: 158 mg/dL (ref >=50)
LDL Cholesterol (Calc): 93 mg/dL (calc)
Non-HDL Cholesterol (Calc): 109 mg/dL (calc) (ref <130)
Total CHOL/HDL Ratio: 1.7 (calc) (ref <5.0)
Triglycerides: 71 mg/dL (ref <150)

## 2019-03-01 LAB — PTH, INTACT AND CALCIUM
Calcium: 9.3 mg/dL (ref 8.6–10.4)
PTH: 22 pg/mL (ref 14–64)

## 2019-03-01 LAB — VITAMIN D 25 HYDROXY (VIT D DEFICIENCY, FRACTURES): Vit D, 25-Hydroxy: 39 ng/mL (ref 30–100)

## 2019-03-01 NOTE — Progress Notes (Signed)
cc'ed to pcp °

## 2019-03-02 ENCOUNTER — Ambulatory Visit (INDEPENDENT_AMBULATORY_CARE_PROVIDER_SITE_OTHER): Payer: Medicare Other | Admitting: "Endocrinology

## 2019-03-02 ENCOUNTER — Other Ambulatory Visit: Payer: Self-pay

## 2019-03-02 ENCOUNTER — Encounter: Payer: Self-pay | Admitting: "Endocrinology

## 2019-03-02 VITALS — BP 128/84 | HR 74 | Ht 60.25 in | Wt 117.0 lb

## 2019-03-02 DIAGNOSIS — E559 Vitamin D deficiency, unspecified: Secondary | ICD-10-CM

## 2019-03-02 DIAGNOSIS — M810 Age-related osteoporosis without current pathological fracture: Secondary | ICD-10-CM | POA: Diagnosis not present

## 2019-03-02 MED ORDER — CALCIUM CARBONATE ANTACID 500 MG PO CHEW: 1.0000 | CHEWABLE_TABLET | Freq: Three times a day (TID) | ORAL | 6 refills | Status: AC

## 2019-03-02 MED ORDER — CHOLECALCIFEROL 50 MCG (2000 UT) PO CAPS: 1.0000 | ORAL_CAPSULE | Freq: Every day | ORAL | 6 refills | Status: DC

## 2019-03-02 NOTE — Progress Notes (Signed)
03/02/2019  Endocrinology follow-up note   Past Medical History:  Diagnosis Date  . Allergic rhinitis   . Anastomotic ulcer JAN 2009  . Anxiety   . Arthritis   . Asthma   . BMI (body mass index) 20.0-29.9 2009 121 lbs  . Concussion   . 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 Crohn disease ([place])   . Crohn disease ([place])   . CTS (carpal tunnel syndrome)   . Diarrhea MULITIFACTORIAL   IBS, LACTOSE INTOLERANCE, SBBO, BILE-SALT  . Elevated liver enzymes 2009: BMI 24 ?Etoh ALT 94, AST 51,  NEG ANA, qIGs& ASMA   MAY 2012 AST 52 ALT 38  . Esophageal stricture 2009  . GERD (gastroesophageal reflux disease)   . Hemorrhoid   . Hyperlipemia   . Hypertension   . Inflammatory bowel disease 2006 CD   SURGICAL REMISSION  . LBP (low back pain)   . Mental disorder   . Migraine   . Osteoporosis   . PONV (postoperative nausea and vomiting)   . Shortness of breath    exertion and humidity  . Shoulder pain    right  . Sleep apnea    Stop [name]   Past Surgical History:  Procedure Laterality Date  . BACK SURGERY    . BACK SURGERY  07/16/2018  . BILATERAL SALPINGOOPHORECTOMY    . BIOPSY  12/24/2011   Procedure: BIOPSY;  Surgeon: Danie Binder, MD;  Location: [facility];  Service: Endoscopy;;  Gastric Biopsies  . BIOPSY N/A 06/30/2012   Procedure: BIOPSY;  Surgeon: Danie Binder, MD;  Location: [facility];  Service: Endoscopy;  Laterality: N/A;  Gastric and Esophageal Biopsies  . CARPAL TUNNEL RELEASE  LEFT  . CATARACT EXTRACTION W/PHACO Right 07/30/2016   Procedure: CATARACT EXTRACTION PHACO AND INTRAOCULAR LENS PLACEMENT (IOC);  Surgeon: Rutherford Guys, MD;  Location: [facility];  Service: Ophthalmology;  Laterality: Right;  CDE: 5.45  . CATARACT EXTRACTION W/PHACO Left 08/13/2016   Procedure: CATARACT EXTRACTION PHACO AND INTRAOCULAR LENS PLACEMENT (IOC);  Surgeon: Rutherford Guys, MD;  Location: [facility];  Service: Ophthalmology;   Laterality: Left;  CDE: 5.59  . COLON SURGERY    . COLONOSCOPY  JAN 2009 DIARRHEA, ABD PAIN, BRBPR   ANASTOMOTIC ULCER(3MM), IH XB:WIOMBT ULCER, NL COLON bX  . COLONOSCOPY WITH PROPOFOL N/A 04/21/2018   PROPOFOL;  Surgeon: Danie Binder, MD; normal examined portion of the ileum, 2 tubular adenomas, diverticulosis in the rectosigmoid and sigmoid colon, external and internal hemorrhoids, significantly been of the colon.  Repeat colonoscopy in 5-10 years with peds colonoscope.  Marland Kitchen DILATION AND CURETTAGE OF UTERUS    . ESOPHAGEAL DILATION  12/24/2011   SLF: A stricture was found in the distal esophagus/ Moderate gastritis/ MILD Duodenitis  . ESOPHAGOGASTRODUODENOSCOPY  02/07/09   mild gastritis  . ESOPHAGOGASTRODUODENOSCOPY (EGD) WITH PROPOFOL N/A 06/30/2012   SLF: 1. No definite stricture appreciated 2. small hiatal hernia 3. Gastritis  . ESOPHAGOGASTRODUODENOSCOPY (EGD) WITH PROPOFOL N/A 02/20/2016   Procedure: ESOPHAGOGASTRODUODENOSCOPY (EGD) WITH PROPOFOL;  Surgeon: Danie Binder, MD;  [facility];  Service: Endoscopy;  Laterality: N/A;  930  . ESOPHAGOGASTRODUODENOSCOPY (EGD) WITH PROPOFOL N/A 11/24/2018   PROPOFOL;  Surgeon: Danie Binder, MD; peptic stricture s/p dilation, small hiatal hernia, moderate erosive gastritis.  Marland Kitchen EYE SURGERY    . gum flap Bilateral   . HEMICOLECTOMY  RIGHT 2006  . HERNIA REPAIR    . LUMBAR DISC SURGERY    .  POLYPECTOMY  04/21/2018   Procedure: POLYPECTOMY;  Surgeon: Danie Binder, MD;  [facility];  Service: Endoscopy;;  (colon)  . SAVORY DILATION N/A 06/30/2012   Procedure: SAVORY DILATION;  Surgeon: Danie Binder, MD;  Location: [facility];  Service: Endoscopy;  Laterality: N/A;  12.63m , 195m 1537m. SAVORY DILATION N/A 02/20/2016   Procedure: SAVORY DILATION;  Surgeon: SanDanie BinderD;  [facility];  Service: Endoscopy;  Laterality: N/A;  . SAVORY DILATION N/A 11/24/2018   Procedure: SAVORY DILATION;  Surgeon:  FieDanie BinderD;  [facility];  Service: Endoscopy;  Laterality: N/A;  . UPPER GASTROINTESTINAL ENDOSCOPY  JAN 2009 ABD PAIN   GASTRITIS, ESO RING  . UPPER GASTROINTESTINAL ENDOSCOPY  OCT 2010 ABD PAIN, DYSPHAGIA   DIL 15MM, GASTRITIS, NL DUODENUM  . UPPER GASTROINTESTINAL ENDOSCOPY  OCT 2010 DYSPHAGIA   DIL 17 MM, GASTRITIS, NL DUODENUM  . vocal cord surgery  Sep 20, 2010   precancerous areas removed  . wisdom tooth extraction Right    pin placement due to broken jaw   Social History   Socioeconomic History  . Marital status: Single    Spouse name: [place] Number of children: 2  . Years of education: GED  . Highest education level: Not on file  Occupational History    Comment: disabled  Social Needs  . Financial resource strain: Not on file  . Food insecurity    Worry: Not on file    Inability: Not on file  . Transportation needs    Medical: Not on file    Non-medical: Not on file  Tobacco Use  . Smoking status: Current Every Day Smoker    Packs/day: 0.25    Years: 30.00    Pack years: 7.50    Types: Cigarettes  . Smokeless tobacco: Never Used  Substance and Sexual Activity  . Alcohol use: Yes    Comment: wine a few times a week   . Drug use: No  . Sexual activity: Not Currently    Birth control/protection: Post-menopausal  Lifestyle  . Physical activity    Days per week: Not on file    Minutes per session: Not on file  . Stress: Not on file  Relationships  . Social conHerbalist phone: Not on file    Gets together: Not on file    Attends religious service: Not on file    Active member of club or organization: Not on file    Attends meetings of clubs or organizations: Not on file    Relationship status: Not on file  Other Topics Concern  . Not on file  Social History Narrative   Lives with husband   no caffiene   Outpatient Encounter Medications as of 03/02/2019  Medication Sig  . amLODipine (NORVASC) 5 MG tablet Take 1  tablet (5 mg total) by mouth daily.  . benzonatate (TESSALON) 200 MG capsule Take 200 mg by mouth 3 (three) times daily as needed for cough.  . calcium carbonate (TUMS - DOSED IN MG ELEMENTAL CALCIUM) 500 MG chewable tablet Chew 1 tablet (200 mg of elemental calcium total) by mouth 3 (three) times daily with meals.  . Cholecalciferol 50 MCG (2000 UT) CAPS Take 1 capsule (2,000 Units total) by mouth daily with breakfast.  . cholestyramine (QUESTRAN) 4 GM/DOSE powder DISSOLVE AND TAKE 2 GRAMS BY MOUTH TWICE DAILY WITH A MEAL- DO NOT TAKE WITHIN 2  HOURS OF OTHER MEDICATIONS (Patient taking differently: Take 4 g by mouth daily with breakfast. )  . cyclobenzaprine (FLEXERIL) 10 MG tablet Take 10 mg by mouth as needed for muscle spasms.   Marland Kitchen denosumab (PROLIA) 60 MG/ML SOSY injection Inject 60 mg into the skin every 6 (six) months.  . dexlansoprazole (DEXILANT) 60 MG capsule 1 PO EVERY MORNING WITH BREAKFAST. (Patient taking differently: Take 60 mg by mouth daily with breakfast. )  . diazepam (VALIUM) 5 MG tablet Take 5 mg by mouth every 12 (twelve) hours as needed for anxiety.  . diphenhydrAMINE (BENADRYL) 25 MG tablet Take 25 mg by mouth as needed for itching.   . estradiol (ESTRACE VAGINAL) 0.1 MG/GM vaginal cream Place 3.89 Applicatorfuls vaginally 3 (three) times a week. This 0.5 g/ application  . fluticasone (FLONASE) 50 MCG/ACT nasal spray USE TWO SPRAY(S) IN EACH NOSTRIL ONCE DAILY AS NEEDED FOR ALLERGY OR RHINITIS (Patient taking differently: Place 2 sprays into both nostrils daily as needed for allergies or rhinitis. )  . gabapentin (NEURONTIN) 400 MG capsule Take 1 capsule (400 mg total) by mouth 4 (four) times daily.  Marland Kitchen lactase (LACTAID) 3000 units tablet Take 3,000 Units by mouth daily as needed (lactose intolerance).  . Lidocaine HCl (ASPERCREME LIDOCAINE) 4 % LIQD Apply 1 application topically 3 (three) times daily as needed (pain).  Marland Kitchen loratadine (CLARITIN) 10 MG tablet Take 1 tablet (10 mg  total) by mouth daily. (Patient taking differently: Take 10 mg by mouth daily as needed for allergies. )  . magnesium hydroxide (MILK OF MAGNESIA) 400 MG/5ML suspension Take 30 mLs by mouth daily as needed for mild constipation.  . Menthol (BIOFREEZE) 10 % AERO Apply 1 application topically 3 (three) times daily as needed (pain).  . nystatin (MYCOSTATIN) 100000 UNIT/ML suspension TAKE 5 ML BY MOUTH  4 TIMES DAILY AS NEEDED FOR  THRUSH  . oxyCODONE-acetaminophen (PERCOCET) 10-325 MG tablet Take 1 tablet by mouth every 6 (six) hours as needed for pain.   . SYMBICORT 80-4.5 MCG/ACT inhaler Inhale 2 puffs by mouth twice daily  . UNABLE TO FIND Capsule to rebuild cells-BID  . UNABLE TO FIND as needed. Penetrex cream  . VENTOLIN HFA 108 (90 Base) MCG/ACT inhaler Inhale 2 puffs into the lungs every 4 (four) hours as needed for wheezing or shortness of breath. INHALE 2 PUFFS BY MOUTH EVERY 4 HOURS AS NEEDED FOR WHEEZING (Patient taking differently: Inhale 2 puffs into the lungs every 4 (four) hours as needed for wheezing or shortness of breath. )  . [DISCONTINUED] calcium carbonate (TUMS - DOSED IN MG ELEMENTAL CALCIUM) 500 MG chewable tablet Chew 1 tablet (200 mg of elemental calcium total) by mouth 3 (three) times daily with meals. (Patient taking differently: Chew 1 tablet by mouth daily. )   No facility-administered encounter medications on file as of 03/02/2019.    ALLERGIES: Allergies  Allergen Reactions  . Naproxen Shortness Of Breath    Chest pain  . Eggs Or Egg-Derived Products Diarrhea    abd pain  . Lovastatin Other (See Comments)    Chest pain  . Toradol [Ketorolac Tromethamine] Other (See Comments)    Headache. Non-effective  . Tramadol Other (See Comments)    Headache. Non-effective.  Marland Kitchen Nexium [Esomeprazole Magnesium] Diarrhea    Abdominal pain    VACCINATION STATUS: Immunization History  Administered Date(s) Administered  . DT 02/17/2017  . H1N1 03/22/2008  . Influenza Whole  02/18/2006, 02/03/2007, 02/18/2008, 02/15/2010  . Influenza,inj,Quad PF,6+ Mos 04/13/2013,  02/22/2014, 01/09/2016, 02/24/2018, 02/22/2019  . Influenza-Unspecified 02/21/2017  . Pneumococcal Polysaccharide-23 02/18/2006, 08/08/2015  . Td 06/12/2009  . Tdap 08/08/2015, 02/21/2017  . Zoster 08/08/2015  . Zoster Recombinat (Shingrix) 02/19/2017, 05/20/2017     HPI   [name] [name] is 63 y.o. female who presents today with a medical history as above. she is being seen in follow-up after she was seen in consultation for age-related osteoporosis . PMD:  Alycia Rossetti, MD.  Patient was diagnosed with osteoporosis  approximately 50 years ago.  Was treated with various forms of osteoporosis medications including oral bisphosphonates, Reclast IV x2 years.  She was recently switched to Prolia, first dose March 07, 2018, last injection August 2020.  She has multiple medical problems including Crohn's disease which required high-dose steroids in the past, chronic active smoking.    -She denies any prior history of fragility fractures  I reviewed pt's DEXA scans: Over a period of 5 years from 2014 06/2017 her spinal T score improved from -4.2 to -3.9.  -She has responded to vitamin D supplement with her previsit vitamin D level at 39 improving from 9.   -She is on daily calcium supplements. -She attempts to stay active, largely avoiding contact sports.  No prior history of hyperparathyroidism, hyperthyroidism.  -She has normal renal function. -She has family history of osteoporosis in her mother.    I reviewed her chart and she also has a history of GERD, esophageal stricture.  -She denies any loss of height.  Review of Systems  Constitutional: + She has a steady weight,  no fatigue, no subjective hyperthermia, no subjective hypothermia Eyes: no blurry vision, no xerophthalmia ENT: no sore throat, no nodules palpated in throat, no dysphagia/odynophagia, no hoarseness Cardiovascular: no  Chest Pain, no Shortness of Breath, no palpitations, no leg swelling Respiratory: no cough, no SOB  Musculoskeletal: no muscle/joint aches Skin: no rashes Neurological: no tremors, no numbness, no tingling, no dizziness Psychiatric: no depression, no anxiety  Objective:    BP 128/84   Pulse 74   Ht 5' 0.25" (1.53 m)   Wt 117 lb (53.1 kg)   BMI 22.66 kg/m   Wt Readings from Last 3 Encounters:  03/02/19 117 lb (53.1 kg)  02/24/19 116 lb 6.4 oz (52.8 kg)  02/22/19 114 lb (51.7 kg)    Physical Exam  Constitutional:  Body mass index is 22.66 kg/m.  not in acute distress, normal state of mind Eyes:  EOMI, no exophthalmos Neck: Supple Respiratory: Adequate breathing efforts Musculoskeletal: no gross deformities, strength intact in all four extremities, no gross restriction of joint movements Skin:  no rashes, no hyperemia Neurological: no tremor with outstretched hands.   Recent Results (from the past 2160 hour(s))  PTH, Intact and Calcium     Status: None   Collection Time: 02/26/19 10:06 AM  Result Value Ref Range   PTH 22 14 - 64 pg/mL    Comment: . Interpretive Guide    Intact PTH           Calcium ------------------    ----------           ------- Normal Parathyroid    Normal               Normal Hypoparathyroidism    Low or Low Normal    Low Hyperparathyroidism    Primary            Normal or High       High  Secondary          High                 Normal or Low    Tertiary           High                 High Non-Parathyroid    Hypercalcemia      Low or Low Normal    High .    Calcium 9.3 8.6 - 10.4 mg/dL  COMPLETE METABOLIC PANEL WITH GFR     Status: Abnormal   Collection Time: 02/26/19 10:06 AM  Result Value Ref Range   Glucose, Bld 81 65 - 99 mg/dL    Comment: .            Fasting reference interval .    BUN 7 7 - 25 mg/dL   Creat 0.43 (L) 0.50 - 0.99 mg/dL    Comment: For patients >78 years of age, the reference limit for Creatinine is approximately  13% higher for people identified as African-American. .    GFR, Est Non African American 110 > OR = 60 mL/min/1.79m   GFR, Est African American 127 > OR = 60 mL/min/1.741m  BUN/Creatinine Ratio 16 6 - 22 (calc)   Sodium 140 135 - 146 mmol/L   Potassium 3.8 3.5 - 5.3 mmol/L   Chloride 102 98 - 110 mmol/L   CO2 27 20 - 32 mmol/L   Calcium 9.3 8.6 - 10.4 mg/dL   Total Protein 7.2 6.1 - 8.1 g/dL   Albumin 4.4 3.6 - 5.1 g/dL   Globulin 2.8 1.9 - 3.7 g/dL (calc)   AG Ratio 1.6 1.0 - 2.5 (calc)   Total Bilirubin 0.4 0.2 - 1.2 mg/dL   Alkaline phosphatase (APISO) 333 (H) 37 - 153 U/L   AST 110 (H) 10 - 35 U/L   ALT 130 (H) 6 - 29 U/L  CBC with Differential/Platelet     Status: Abnormal   Collection Time: 02/26/19 10:06 AM  Result Value Ref Range   WBC 5.3 3.8 - 10.8 Thousand/uL   RBC 4.18 3.80 - 5.10 Million/uL   Hemoglobin 14.9 11.7 - 15.5 g/dL   HCT 42.6 35.0 - 45.0 %   MCV 101.9 (H) 80.0 - 100.0 fL   MCH 35.6 (H) 27.0 - 33.0 pg   MCHC 35.0 32.0 - 36.0 g/dL   RDW 12.7 11.0 - 15.0 %   Platelets 254 140 - 400 Thousand/uL   MPV 10.5 7.5 - 12.5 fL   Neutro Abs 3,731 1,500 - 7,800 cells/uL   Lymphs Abs 811 (L) 850 - 3,900 cells/uL   Absolute Monocytes 519 200 - 950 cells/uL   Eosinophils Absolute 191 15 - 500 cells/uL   Basophils Absolute 48 0 - 200 cells/uL   Neutrophils Relative % 70.4 %   Total Lymphocyte 15.3 %   Monocytes Relative 9.8 %   Eosinophils Relative 3.6 %   Basophils Relative 0.9 %  Lipid Panel     Status: Abnormal   Collection Time: 02/26/19 10:06 AM  Result Value Ref Range   Cholesterol 267 (H) <200 mg/dL   HDL 158 > OR = 50 mg/dL   Triglycerides 71 <150 mg/dL   LDL Cholesterol (Calc) 93 mg/dL (calc)    Comment: Reference range: <100 . Desirable range <100 mg/dL for primary prevention;   <70 mg/dL for patients with CHD or diabetic patients  with > or = 2 CHD  risk factors. Marland Kitchen LDL-C is now calculated using the Martin-Hopkins  calculation, which is a  validated novel method providing  better accuracy than the Friedewald equation in the  estimation of LDL-C.  Cresenciano Genre et al. Annamaria Helling. 6629;476(54): 2061-2068  (http://education.[facility].com/faq/FAQ164)    Total CHOL/HDL Ratio 1.7 <5.0 (calc)   Non-HDL Cholesterol (Calc) 109 <130 mg/dL (calc)    Comment: For patients with diabetes plus 1 major ASCVD risk  factor, treating to a non-HDL-C goal of <100 mg/dL  (LDL-C of <70 mg/dL) is considered a therapeutic  option.   Vitamin D, 25-hydroxy     Status: None   Collection Time: 02/26/19 10:06 AM  Result Value Ref Range   Vit D, 25-Hydroxy 39 30 - 100 ng/mL    Comment: Vitamin D Status         25-OH Vitamin D: . Deficiency:                    <20 ng/mL Insufficiency:             20 - 29 ng/mL Optimal:                 > or = 30 ng/mL . For 25-OH Vitamin D testing on patients on  D2-supplementation and patients for whom quantitation  of D2 and D3 fractions is required, the [facility]ureD(TM) 25-OH VIT D, (D2,D3), LC/MS/MS is recommended: order  code [phone] (patients >52yr). See Note 1 . Note 1 . For additional information, please refer to  http://education.[facility].com/faq/FAQ199  (This link is being provided for informational/ educational purposes only.)      Assessment: 1. Osteoporosis  Plan: 1. Osteoporosis - likely multifactorial including exposure to large dose steroids, chronic heavy smoking, postmenopausal status, inadequate calcium/vitamin D supplements  -Her  work-up rules out primary hyperparathyroidism. - Discussed about increased risk of fracture, depending on the T score, greatly increased when the T score is lower than -2.5.  We reviewed her DEXA scans together, and I explained that based on the T scores, she has an increased risk for fractures, although her T score is slowly improving over time. -She continues to have issues with GERD, will not tolerate oral bisphosphonates. - she has taken Reclast for  2 years before see what she was switched to Prolia.  She took 2 doses of Prolia so far, November 2019 and August 2020.   -She scheduled to receive her next dose in January 2021.    -I advised her to remain on Prolia for various reasons.   She has already arranged to get her prescription delivered to Dr. DDorian Heckle office, and has appointment for her next injection.  -She has responded to her vitamin D supplement with blood level of 39, will need maintenance vitamin D 3 2000 units daily. -She is also advised to remain on calcium carbonate 500 mg tablet 3 times a day with meals.  - We discussed fall precautions : She is advised to avoid contact sports, vigorous exercise such as flipping, running.  However continue on light weightbearing exercises 5 to 7 days a week, walking, water aerobics. - we discussed about maintaining a good amount of protein in her diet, advised to quit smoking, avoid alcohol if possible.   -Her next bone density is not until October 2021, however patient will return in 6 months, about the time of her next scheduled Prolia injection.  - I advised patient to maintain close follow up with DAlycia Rossetti MD for primary care needs.  Time for this visit: 15 minutes. John Giovanni  participated in the discussions, expressed understanding, and voiced agreement with the above plans.  All questions were answered to her satisfaction. she is encouraged to contact clinic should she have any questions or concerns prior to her return visit.   Follow up plan: Return in about 6 months (around 08/31/2019) for Follow up with Pre-visit Labs.   Glade Lloyd, MD [facility] [address] [place], [place] [address] Phone: [phone]  Fax: [phone]     03/02/2019, 5:05 PM  This note was partially dictated with voice recognition software. Similar sounding words can be transcribed inadequately or may not  be corrected upon  review.

## 2019-03-03 ENCOUNTER — Telehealth: Payer: Self-pay | Admitting: Gastroenterology

## 2019-03-03 NOTE — Telephone Encounter (Signed)
Patient called and wanted to know the results of her ultrasound and lab work   [phone]

## 2019-03-03 NOTE — Progress Notes (Signed)
[place] with 5 mm polyp. Doubt this is contributing to her RUQ pain. No evidence of inflammation of the gallbladder. Liver with findings consistent with fatty liver. I reviewed her labs. Her LFTs have increased compared to 3 months ago. Alk phos 333 up from 105, AST 110 up from 80, and ALT 130 up from 79. CBC with normal WBC, platelets, and Hgb. She does have macrocytic indices which may be related to alcohol.   Can we inquire on how much alcohol patient has been drinking recently? She definitely needs to avoid all alcohol.  How is her RUQ pain? I need her to complete some additional labs that have fallen through the cracks over the years. Doris, can you arrange AMA and alpha-1-antitrypsin?

## 2019-03-04 ENCOUNTER — Other Ambulatory Visit: Payer: Self-pay

## 2019-03-04 DIAGNOSIS — R1011 Right upper quadrant pain: Secondary | ICD-10-CM

## 2019-03-04 NOTE — Telephone Encounter (Signed)
See result note.  

## 2019-03-11 ENCOUNTER — Telehealth: Payer: Self-pay | Admitting: Gastroenterology

## 2019-03-11 NOTE — Telephone Encounter (Signed)
Noted. Agree with recommendations to follow-up with PCP. Regarding her right side pain, she needs to have labs completed that have been placed when she gets a chance. Not sure if this is related to her liver or a msk problem. Her LFTs had increased when we checked them recently which is why she needs the additional labs to help sort this out.   We do need to get patient scheduled for a follow-up as well. Would like for her to see Dr. Oneida Alar in the next couple of months.

## 2019-03-11 NOTE — Telephone Encounter (Signed)
PT is aware of the plan and will call her PCP as recommended. She will try to do the labs when she can. She is aware we will try to get her in to see Dr. Oneida Alar in the next month or so.  Forwarding to [place] to schedule the appointment.

## 2019-03-11 NOTE — Telephone Encounter (Signed)
I spoke to pt and the first thing she said was that " My eyes are red and puffy, I am coughing and my chest hurts". Said she did not have any arm pain or other cardiac symptoms.  She said she is not going out because she doesn't know what is wrong with her. She then said that her right side does still hurt some in RUQ and radiates to entire right side, and she doesn't have much appetite but that might be because of the other symptoms that she has. She was advise to call PCP for the red, puffy eyes and cough and chest problem.  I told her I will let Cyril Mourning know about the ongoing problem with her right side. She is aware that Cyril Mourning is off this afternoon.

## 2019-03-11 NOTE — Telephone Encounter (Signed)
Pt was seen recently and said she wasn't feeling well. Her side hurts, she's bloated and no appetite. She would like to see what the nurse would recommend. [phone]

## 2019-03-12 ENCOUNTER — Encounter: Payer: Self-pay | Admitting: Gastroenterology

## 2019-03-12 NOTE — Telephone Encounter (Signed)
OV made and letter mailed °

## 2019-03-15 DIAGNOSIS — R1011 Right upper quadrant pain: Secondary | ICD-10-CM | POA: Diagnosis not present

## 2019-03-16 LAB — MITOCHONDRIAL ANTIBODIES: Mitochondrial M2 Ab, IgG: <=20 U

## 2019-03-16 LAB — ALPHA-1-ANTITRYPSIN: A-1 Antitrypsin, Ser: 151 mg/dL (ref 83–199)

## 2019-03-18 ENCOUNTER — Other Ambulatory Visit: Payer: Self-pay

## 2019-03-18 DIAGNOSIS — R1011 Right upper quadrant pain: Secondary | ICD-10-CM

## 2019-03-19 ENCOUNTER — Other Ambulatory Visit: Payer: Self-pay | Admitting: *Deleted

## 2019-03-19 DIAGNOSIS — R109 Unspecified abdominal pain: Secondary | ICD-10-CM

## 2019-03-23 ENCOUNTER — Ambulatory Visit (HOSPITAL_COMMUNITY): Payer: Medicare Other

## 2019-03-28 ENCOUNTER — Other Ambulatory Visit: Payer: Self-pay | Admitting: Family Medicine

## 2019-03-31 ENCOUNTER — Ambulatory Visit (HOSPITAL_COMMUNITY): Payer: Medicare Other

## 2019-04-02 ENCOUNTER — Other Ambulatory Visit (HOSPITAL_COMMUNITY): Payer: Medicare Other

## 2019-04-05 ENCOUNTER — Other Ambulatory Visit: Payer: Self-pay

## 2019-04-05 ENCOUNTER — Telehealth: Payer: Self-pay | Admitting: Gastroenterology

## 2019-04-05 ENCOUNTER — Ambulatory Visit (HOSPITAL_COMMUNITY)
Admission: RE | Admit: 2019-04-05 | Discharge: 2019-04-05 | Disposition: A | Discharge status: Home / Self Care | Payer: Medicare Other | Source: Ambulatory Visit | Attending: Gastroenterology | Admitting: Gastroenterology

## 2019-04-05 DIAGNOSIS — R109 Unspecified abdominal pain: Secondary | ICD-10-CM | POA: Diagnosis not present

## 2019-04-05 DIAGNOSIS — R1011 Right upper quadrant pain: Secondary | ICD-10-CM | POA: Diagnosis not present

## 2019-04-05 DIAGNOSIS — K5730 Diverticulosis of large intestine without perforation or abscess without bleeding: Secondary | ICD-10-CM | POA: Diagnosis not present

## 2019-04-05 MED ORDER — IOHEXOL 300 MG/ML  SOLN
75.0000 mL | Freq: Once | INTRAMUSCULAR | Status: AC | PRN
  Administered 2019-04-05: 75 mL via INTRAVENOUS

## 2019-04-05 NOTE — Telephone Encounter (Signed)
Reviewed CT findings with radiologist.  No significant findings to explain her abdominal pain. She does have a small cyst in her liver; however, this has only increased by 2 mm in the last 3 years and radiologist states this is benign and he would not pursue any further evaluation of this. This is likely an incidental finding. Her stomach was distended with contrast material and food which may be related to a recent meal. Doris, could you ask patient when her last meal was prior to her CT scan?  Is she having any nausea or vomiting?  How is her abdominal pain?   I did request she complete additional labs on 11/12 which have not been completed yet.  Doris, could you remind patient about having these labs completed?

## 2019-04-05 NOTE — Progress Notes (Signed)
Addressed under separate telephone note. See note dated 04/05/19 for additional information.   Reviewed CT findings with radiologist.  No significant findings to explain her abdominal pain. She does have a small cyst in her liver; however, this has only increased by 2 mm in the last 3 years and radiologist states this is benign and he would not pursue any further evaluation of this. This is likely an incidental finding. Her stomach was distended with contrast material and food which may be related to a recent meal.  Will have nurse follow up with patient regarding stomach distension as well as her abdominal pain and labs I had requested on 03/18/19 that have not been completed. See separate telephone note.

## 2019-04-06 ENCOUNTER — Telehealth: Payer: Self-pay | Admitting: Family Medicine

## 2019-04-06 NOTE — Telephone Encounter (Signed)
Call placed to patient.   States that [employer], [place] at GI has ordered more labs to evaluate her RUQ pain. Requested to have following labs drawn at our office: Hepatic Function Panel Lipase Iron, TIBC and Ferritin Hemochromatosis  MD please advise.   Also inquired as to if MD received results of labs drawn in October 2020. Advised that results were reviewed by Dr. Dorris Fetch, but PCP could review as well.

## 2019-04-06 NOTE — Telephone Encounter (Signed)
Call placed to patient and patient made aware per VM.  

## 2019-04-06 NOTE — Telephone Encounter (Signed)
Patient calling to ask question about doing labwork for another doctor, under normal circumstances I know we do not do this however, she says she has done this before and it just depends on the situation  Please call and advise  [phone]

## 2019-04-06 NOTE — Telephone Encounter (Signed)
Her hiatal hernia should not be causing right sided abdominal pain. With symptoms worsening with certain movements and raising her leg, I am more suspicious of a MSK component. She should also follow-up with her PCP on this.

## 2019-04-06 NOTE — Telephone Encounter (Signed)
PT is aware of results. She said she had her last meal between 5-6 pm the evening before CT.  She is not having nausea or vomiting. She continues to have pain in her RUQ that is intermittent and depends on her activity. She has difficulty lifting her leg up. She asked if her hiatal hernia could be causing some of her problem. She will try to go to the labs in next few days.

## 2019-04-06 NOTE — Telephone Encounter (Signed)
PT is aware and will discuss with PCP.

## 2019-04-06 NOTE — Telephone Encounter (Signed)
I would recommend she go to the [facility] in [place] to have those done since she is not being seen for OV.

## 2019-04-14 ENCOUNTER — Ambulatory Visit (INDEPENDENT_AMBULATORY_CARE_PROVIDER_SITE_OTHER): Payer: Medicare Other | Admitting: Gastroenterology

## 2019-04-14 ENCOUNTER — Encounter: Payer: Self-pay | Admitting: Gastroenterology

## 2019-04-14 ENCOUNTER — Other Ambulatory Visit: Payer: Self-pay

## 2019-04-14 DIAGNOSIS — K50019 Crohn's disease of small intestine with unspecified complications: Secondary | ICD-10-CM

## 2019-04-14 DIAGNOSIS — R7402 Elevation of levels of lactic acid dehydrogenase (LDH): Secondary | ICD-10-CM | POA: Diagnosis not present

## 2019-04-14 DIAGNOSIS — R1310 Dysphagia, unspecified: Secondary | ICD-10-CM | POA: Diagnosis not present

## 2019-04-14 DIAGNOSIS — R1011 Right upper quadrant pain: Secondary | ICD-10-CM

## 2019-04-14 DIAGNOSIS — R7401 Elevation of levels of liver transaminase levels: Secondary | ICD-10-CM

## 2019-04-14 DIAGNOSIS — R1319 Other dysphagia: Secondary | ICD-10-CM

## 2019-04-14 NOTE — Assessment & Plan Note (Signed)
DUE TO ETOH USE.  CONTINUE TO MONITOR SYMPTOMS. NO ADDITIONAL WORKUP NEEDED AT THIS TIME. FOLLOW UP IN 6 MOS.

## 2019-04-14 NOTE — Progress Notes (Signed)
ON RECALL  °

## 2019-04-14 NOTE — Progress Notes (Signed)
cc'ed to pcp °

## 2019-04-14 NOTE — Assessment & Plan Note (Signed)
SYMPTOMS FAIRLY WELL CONTROLLED.  CONTINUE TO MONITOR SYMPTOMS. FOLLOW UP IN 6 MOS.

## 2019-04-14 NOTE — Patient Instructions (Addendum)
EAT TO LIVE AND THINK OF FOOD AS MEDICINE. 75% OF YOUR PLATE SHOULD BE FRUITS/VEGGIES.  To have LESS ABDOMINAL PAIN AND REFLUX:      1. If you must eat bread, EAT EZEKIEL BREAD. IT IS IN THE FROZEN SECTION OF THE GROCERY STORE.    2. DRINK WATER WITH FRUIT OR CUCUMBER ADDED. YOUR URINE SHOULD BE LIGHT YELLOW. AVOID SODA, GATORADE, ENERGY DRINKS, OR DIET SODA.     3. AVOID HIGH FRUCTOSE CORN SYRUP AND CAFFEINE.     4. DO NOT chew SUGAR FREE GUM OR USE ARTIFICIAL SWEETENERS. IF NEEDED USE STEVIA AS A SWEETENER.    5. DO NOT EAT ENRICHED WHEAT FLOUR, PASTA, RICE, OR CEREAL.    6. ONLY EAT WILD CAUGHT SEAFOOD, GRASS FED BEEF OR CHICKEN, PORK FROM PASTURE RAISE PIGS, OR EGGS FROM PASTURE RAISED CHICKENS.    7. PRACTICE CHAIR YOGA FOR 15-30 MINS 3 OR 4 TIMES A WEEK AND PROGRESS TO HATHA YOGA OVER NEXT 6 MOS.    8. START TAKING A MULTIVITAMIN(CENTRUM SILVER), AND VITAMIN B12 1000 MCG DAILY. CONTINUE VITAMIN D3 2000 IU DAILY.    9. ADD TYLENOL 500 MG 2 DAILY.     10. APPLY HEAT THREE TIMES A DAY HELP YOUR RIGHT SIDE PAIN.  YOU CAN ALTERNATE WITH ICE PACK. DO NOT APPLY ICE PACKS DIRECTLY TO YOUR SKIN.     CONTINUE DEXILANT.  CONTINUE CALCIUM.   FOLLOW UP IN 6 MOS.

## 2019-04-14 NOTE — Assessment & Plan Note (Signed)
SYMPTOMS FAIRLY WELL CONTROLLED AFTER SURGERY.  CONTINUE TO MONITOR SYMPTOMS. FOLLOW UP IN 6 MOS.

## 2019-04-14 NOTE — Progress Notes (Signed)
Subjective:    Patient ID: [name] [name], female    DOB: [birth year], 62 y.o.   MRN: [id number]  Alycia Rossetti, MD  HPI Heartburn NOT WELL CONTROLLED. SMOKES 1 PACK EVERY 4 DAYS. HAS PROBLEM WITH RIGHT SIDE PAIN. IT COMES AND GOES. WILL GET WORSE IF SHE BENDS BACK AND IF BENDS IT WILL SPASM. [place] OCT 2020-FATTY LIVER, GB POLYP. NO ASPIRIN, BC/GOODY POWDERS, IBUPROFEN/MOTRIN, OR NAPROXEN/ALEVE. MUSCLE RELAXER ONCE A DAY. "DRINKS OCCASIONALLY". STOPPED TAKING DEXILANT DUE TO RUQ PAIN. THINKS VITAMINS MAY CAUSE ABDOMINAL PAIN. BMs: OCCASIONAL DIARRHEA. SWALLOWING OFF AND ON GOOD AND BAD. PROBLEMS SWALLOWING  PILLS. DRINKS IHKVQQ(59-56 OZ).NO COFFEE. DRINK DECAF GREEN TEA AND LEMON. HEARTBURN NOT CONTROLLED. CHEST HURTS ONCE A WEEK. SOB SAME.  PT DENIES FEVER, CHILLS, HEMATOCHEZIA, HEMATEMESIS, nausea, vomiting, melena, CHANGE IN BOWEL IN HABITS, OR constipation Past Medical History:  Diagnosis Date  . Allergic rhinitis   . Anastomotic ulcer JAN 2009  . Anxiety   . Arthritis   . Asthma   . BMI (body mass index) 20.0-29.9 2009 121 lbs  . Concussion   . 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 Crohn disease ([place])   . CTS (carpal tunnel syndrome)   . Diarrhea MULITIFACTORIAL   IBS, LACTOSE INTOLERANCE, SBBO, BILE-SALT  . Elevated liver enzymes 2009: BMI 24 ?Etoh ALT 94, AST 51,  NEG ANA, qIGs& ASMA   MAY 2012 AST 52 ALT 38  . Esophageal stricture 2009  . GERD (gastroesophageal reflux disease)   . Hemorrhoid   . Hyperlipemia   . Hypertension   . Inflammatory bowel disease 2006 CD   SURGICAL REMISSION  . LBP (low back pain)   . Mental disorder   . Migraine   . Osteoporosis   . PONV (postoperative nausea and vomiting)   . Shortness of breath    exertion and humidity  . Shoulder pain    right  . Sleep apnea    Stop [name]   Past Surgical History:  Procedure Laterality Date  . BACK SURGERY    . BACK SURGERY  07/16/2018  . BILATERAL SALPINGOOPHORECTOMY    .  BIOPSY  12/24/2011     . BIOPSY N/A 06/30/2012     . CARPAL TUNNEL RELEASE  LEFT  . CATARACT EXTRACTION W/PHACO Right 07/30/2016     . CATARACT EXTRACTION W/PHACO Left 08/13/2016     . COLON SURGERY    . COLONOSCOPY  JAN 2009 DIARRHEA, ABD PAIN, BRBPR   ANASTOMOTIC ULCER(3MM), IH LO:VFIEPP ULCER, NL COLON bX  . COLONOSCOPY WITH PROPOFOL N/A 04/21/2018   PROPOFOL;  Surgeon: Danie Binder, MD; normal examined portion of the ileum, 2 tubular adenomas, diverticulosis in the rectosigmoid and sigmoid colon, external and internal hemorrhoids, significantly been of the colon.  Repeat colonoscopy in 5-10 years with peds colonoscope.  Marland Kitchen DILATION AND CURETTAGE OF UTERUS    . ESOPHAGEAL DILATION  12/24/2011   SLF: A stricture was found in the distal esophagus/ Moderate gastritis/ MILD Duodenitis  . ESOPHAGOGASTRODUODENOSCOPY  02/07/09   mild gastritis  . EGD WITH PROPOFOL N/A 06/30/2012   SLF: 1. No definite stricture appreciated 2. small hiatal hernia 3. Gastritis  . EGD WITH PROPOFOL N/A 02/20/2016   Procedure: ESOPHAGOGASTRODUODENOSCOPY (EGD) WITH PROPOFOL;  Surgeon: Carlyon Prows   . EGD) WITH PROPOFOL N/A 11/24/2018    peptic stricture s/p dilation, small hiatal hernia, moderate erosive gastritis.  Marland Kitchen EYE SURGERY    . gum flap Bilateral   .  HEMICOLECTOMY  RIGHT 2006  . HERNIA REPAIR    . LUMBAR DISC SURGERY    . POLYPECTOMY  04/21/2018     . SAVORY DILATION N/A 06/30/2012     . SAVORY DILATION N/A 02/20/2016     . SAVORY DILATION N/A 11/24/2018     . UPPER GASTROINTESTINAL ENDOSCOPY  JAN 2009 ABD PAIN   GASTRITIS, ESO RING  . UPPER GASTROINTESTINAL ENDOSCOPY  OCT 2010 ABD PAIN, DYSPHAGIA   DIL 15MM, GASTRITIS, NL DUODENUM  . UPPER GASTROINTESTINAL ENDOSCOPY  OCT 2010 DYSPHAGIA   DIL 17 MM, GASTRITIS, NL DUODENUM  . vocal cord surgery  Sep 20, 2010   precancerous areas removed  . wisdom tooth extraction Right    pin placement due to broken jaw   Allergies  Allergen Reactions  . Naproxen  Shortness Of Breath    Chest pain  . Eggs Or Egg-Derived Products Diarrhea    abd pain  . Lovastatin Other (See Comments)    Chest pain  . Toradol [Ketorolac Tromethamine] Other (See Comments)    Headache. Non-effective  . Tramadol Other (See Comments)    Headache. Non-effective.  Marland Kitchen Nexium [Esomeprazole Magnesium] Diarrhea    Abdominal pain   Current Outpatient Medications  Medication Sig    . amLODipine (NORVASC) 5 MG tablet Take 1 tablet (5 mg total) by mouth daily.    . benzonatate (TESSALON) 200 MG capsule Take 200 mg by mouth 3 (three) times daily as needed for cough.    . calcium carbonate (TUMS - DOSED IN MG ELEMENTAL CALCIUM) 500 MG chewable tablet Chew 1 tablet (200 mg of elemental calcium total) by mouth 3 (three) times daily with meals.    . Cholecalciferol 50 MCG (2000 UT) CAPS Take 1 capsule (2,000 Units total) by mouth daily with breakfast.    . cholestyramine (QUESTRAN) 4 GM/DOSE powder  Take 4 g by mouth daily with breakfast. )    . cyclobenzaprine (FLEXERIL) 10 MG tablet Take 10 mg by mouth as needed for muscle spasms.     Marland Kitchen PROLIA) 60 MG/ML SOSY injection Inject 60 mg into the skin every 6 (six) months.    . dexlansoprazole (DEXILANT) 60 MG capsule Take 60 mg by mouth daily with breakfast. )    . diazepam (VALIUM) 5 MG tablet Take 5 mg by mouth every 12 (twelve) hours as needed for anxiety.    . diphenhydrAMINE (BENADRYL) 25 MG tablet Take 25 mg by mouth as needed for itching.     . estradiol (ESTRACE VAGINAL) 0.1 MG/GM vaginal cream Place 1.61 Applicatorfuls vaginally 3 (three) times a week. This 0.5 g/ application    . fluticasone (FLONASE) 50 MCG/ACT nasal spray USE TWO SPRAY(S) IN EACH NOSTRIL ONCE DAILY AS NEEDED FOR ALLERGY OR RHINITIS (Patient taking differently: Place 2 sprays into both nostrils daily as needed for allergies or rhinitis. )    . gabapentin (NEURONTIN) 400 MG capsule Take 1 capsule (400 mg total) by mouth 4 (four) times daily.    Marland Kitchen lactase  (LACTAID) 3000 units tablet Take 3,000 Units by mouth daily as needed (lactose intolerance).    . Lidocaine HCl (ASPERCREME LIDOCAINE) 4 % LIQD Apply 1 application topically 3 (three) times daily as needed (pain).    Marland Kitchen loratadine (CLARITIN) 10 MG tablet Take 1 tablet (10 mg total) by mouth daily. (Patient taking differently: Take 10 mg by mouth daily as needed for allergies. )    . magnesium hydroxide (MILK OF MAGNESIA) 400 MG/5ML suspension Take  30 mLs by mouth daily as needed for mild constipation.    . Menthol (BIOFREEZE) 10 % AERO Apply 1 application topically 3 (three) times daily as needed (pain).    . nystatin (MYCOSTATIN) 100000 UNIT/ML suspension TAKE 5 ML BY MOUTH  4 TIMES DAILY AS NEEDED FOR  THRUSH    . oxyCODONE-acetaminophen (PERCOCET) 10-325 MG tablet Take 1 tablet by mouth every 6 (six) hours as needed for pain.     . SYMBICORT 80-4.5 MCG/ACT inhaler Inhale 2 puffs by mouth twice daily    . UNABLE TO FIND Capsule to rebuild cells-BID    . UNABLE TO FIND as needed. Penetrex cream    . VENTOLIN HFA 108 (90 Base) MCG/ACT inhaler INHALE 2 PUFFS BY MOUTH EVERY 4 HOURS AS NEEDED FOR WHEEZING OR SHORTNESS OF BREATH     Review of Systems PER HPI OTHERWISE ALL SYSTEMS ARE NEGATIVE.    Objective:   Physical Exam Constitutional:      General: She is not in acute distress.    Appearance: Normal appearance.  HENT:     Mouth/Throat:     Comments: MASK IN PLACE Eyes:     General: No scleral icterus.    Pupils: Pupils are equal, round, and reactive to light.  Neck:     Musculoskeletal: Normal range of motion.  Cardiovascular:     Rate and Rhythm: Normal rate and regular rhythm.     Pulses: Normal pulses.     Heart sounds: Normal heart sounds.  Pulmonary:     Effort: Pulmonary effort is normal.     Breath sounds: Normal breath sounds.  Abdominal:     General: Bowel sounds are normal. There is no distension.     Palpations: Abdomen is soft.     Tenderness: There is abdominal  tenderness. There is no guarding.     Comments: MILD TTP IN THE RUQS.   Musculoskeletal:     Right lower leg: No edema.     Left lower leg: No edema.  Lymphadenopathy:     Cervical: No cervical adenopathy.  Skin:    General: Skin is warm and dry.  Neurological:     Mental Status: She is alert and oriented to person, place, and time.     Comments: NO  NEW FOCAL DEFICITS  Psychiatric:        Mood and Affect: Mood normal.     Comments: NORMAL AFFECT        Assessment & Plan:

## 2019-04-14 NOTE — Assessment & Plan Note (Signed)
MOST LIKELY DUE TO MUSCULOSKELETAL PAIN/GASTRITIS/DUODENITIS.  EAT TO LIVE AND THINK OF FOOD AS MEDICINE. 75% OF YOUR PLATE SHOULD BE FRUITS/VEGGIES.  To have LESS ABDOMINAL PAIN AND REFLUX:      1. If you must eat bread, EAT EZEKIEL BREAD. IT IS IN THE FROZEN SECTION OF THE GROCERY STORE.   2. DRINK WATER WITH FRUIT OR CUCUMBER ADDED. YOUR URINE SHOULD BE LIGHT YELLOW. AVOID SODA, GATORADE, ENERGY DRINKS, OR DIET SODA.    3. AVOID HIGH FRUCTOSE CORN SYRUP AND CAFFEINE.    4. DO NOT chew SUGAR FREE GUM OR USE ARTIFICIAL SWEETENERS. IF NEEDED USE STEVIA AS A SWEETENER.   5. DO NOT EAT ENRICHED WHEAT FLOUR, PASTA, RICE, OR CEREAL.   6. ONLY EAT WILD CAUGHT SEAFOOD, GRASS FED BEEF OR CHICKEN, PORK FROM PASTURE RAISE PIGS, OR EGGS FROM PASTURE RAISED CHICKENS.   7. PRACTICE CHAIR YOGA FOR 15-30 MINS 3 OR 4 TIMES A WEEK AND PROGRESS TO HATHA YOGA OVER NEXT 6 MOS.   8. START TAKING A MULTIVITAMIN(CENTRUM SILVER), AND VITAMIN B12 1000 MCG DAILY. CONTINUE VITAMIN D3 2000 IU DAILY.   9. ADD TYLENOL 500 MG 2 DAILY.    10. APPLY HEAT THREE TIMES A DAY HELP YOUR RIGHT SIDE PAIN.  YOU CAN ALTERNATE WITH ICE PACK. DO NOT APPLY ICE PACKS DIRECTLY TO YOUR SKIN.  FOLLOW UP IN 6 MOS.

## 2019-04-15 ENCOUNTER — Other Ambulatory Visit: Payer: Self-pay | Admitting: Family Medicine

## 2019-05-19 ENCOUNTER — Encounter: Payer: Self-pay | Admitting: Family Medicine

## 2019-05-19 ENCOUNTER — Ambulatory Visit (INDEPENDENT_AMBULATORY_CARE_PROVIDER_SITE_OTHER): Payer: Medicare HMO | Admitting: Family Medicine

## 2019-05-19 ENCOUNTER — Other Ambulatory Visit: Payer: Self-pay

## 2019-05-19 DIAGNOSIS — J410 Simple chronic bronchitis: Secondary | ICD-10-CM | POA: Diagnosis not present

## 2019-05-19 DIAGNOSIS — M810 Age-related osteoporosis without current pathological fracture: Secondary | ICD-10-CM

## 2019-05-19 DIAGNOSIS — J029 Acute pharyngitis, unspecified: Secondary | ICD-10-CM | POA: Diagnosis not present

## 2019-05-19 DIAGNOSIS — B370 Candidal stomatitis: Secondary | ICD-10-CM | POA: Diagnosis not present

## 2019-05-19 MED ORDER — BENZONATATE 200 MG PO CAPS: 200.0000 mg | ORAL_CAPSULE | Freq: Three times a day (TID) | ORAL | 0 refills | Status: DC | PRN

## 2019-05-19 MED ORDER — AMOXICILLIN 500 MG PO CAPS: 500.0000 mg | ORAL_CAPSULE | Freq: Two times a day (BID) | ORAL | 0 refills | Status: DC

## 2019-05-19 NOTE — Progress Notes (Signed)
Virtual Visit via Telephone Note  I connected with [name] [name] on 05/19/19 at 4:09pm by telephone and verified that I am speaking with the correct person using two identifiers.      Pt location: at home   Physician location:  In office, [facility], [name] MD     On call: patient and physician   I discussed the limitations, risks, security and privacy concerns of performing an evaluation and management service by telephone and the availability of in person appointments. I also discussed with the patient that there may be a patient responsible charge related to this service. The patient expressed understanding and agreed to proceed.   History of Present Illness:  Sore throat for the past 2 weeks. She states her eyes are bloodshot red, has some cough and congestion, no fever.  She denies any wheezing or difficulty breathing no body aches.  States that there are white spots in the back of her throat but she also has thrush on her tongue.  She does have some discomfort with swallowing.  Neurology appt changed her to telehealth yesterday for her medications- there was no change to her chronic pain medications  She had appt with GI in December for her Crohn's disease no major changes.  Porosis she supposed be on Prolia injection.  She is not sure if she could have another injection states that she had one back in January but at the same time she had some type of surgery and was on medication she is not sure if she had a reaction to the Prolia or if it was due to the antibiotic of the surgery.  Describes the reaction is having some sores that popped up on her body.  She is overdue for her second Prolia injection.  She did have appointment with endocrinology in October who recommended that she continue with the Prolia injection.  I also discussed with her in October about continuing the injection.    Observations/Objective: No acute distress noted over the phone.  She does  sound like she has hoarse voice  Assessment and Plan: Acute bronchitis pharyngitis.  Recommend she get Covid testing as she is high risk for complications if she were to contract the virus.  Unable to visualize her throat but she has had sore throat for the past 2 weeks with all of her other medical problems we will go ahead and place her on amoxicillin will have her gargle with salt water but she will also use her nystatin for the [facility] and she can swish and swallow this.  She can use Tessalon Perles as needed for cough which has worked well for her.  She is not having any bronchospasm at this time.    Regarding the osteoporosis recommend that she get another Prolia injection we will then see if she does have some type of reaction does not appear that she had hives before but some type of sore that came up on her body.  Continue her calcium and vitamin D.  Recommend she wait about 2 weeks see if we can get the Prolia injection ordered in the office and then she can come in to have this done.  Follow Up Instructions:    I discussed the assessment and treatment plan with the patient. The patient was provided an opportunity to ask questions and all were answered. The patient agreed with the plan and demonstrated an understanding of the instructions.   The patient was advised to call back  or seek an in-person evaluation if the symptoms worsen or if the condition fails to improve as anticipated.  I provided 22  minutes of non-face-to-face time during this encounter. End Time: 4:31pm  [name], MD

## 2019-05-20 ENCOUNTER — Ambulatory Visit: Payer: Medicare Other

## 2019-05-20 ENCOUNTER — Ambulatory Visit: Payer: Medicare HMO | Attending: Internal Medicine

## 2019-05-20 DIAGNOSIS — Z20822 Contact with and (suspected) exposure to covid-19: Secondary | ICD-10-CM | POA: Diagnosis not present

## 2019-05-22 LAB — NOVEL CORONAVIRUS, NAA: SARS-CoV-2, NAA: NOT DETECTED

## 2019-05-28 DIAGNOSIS — M542 Cervicalgia: Secondary | ICD-10-CM | POA: Diagnosis not present

## 2019-05-28 DIAGNOSIS — M546 Pain in thoracic spine: Secondary | ICD-10-CM | POA: Diagnosis not present

## 2019-05-28 DIAGNOSIS — M5412 Radiculopathy, cervical region: Secondary | ICD-10-CM | POA: Diagnosis not present

## 2019-05-28 DIAGNOSIS — M4316 Spondylolisthesis, lumbar region: Secondary | ICD-10-CM | POA: Diagnosis not present

## 2019-06-01 ENCOUNTER — Other Ambulatory Visit: Payer: Self-pay | Admitting: Family Medicine

## 2019-06-09 ENCOUNTER — Other Ambulatory Visit (HOSPITAL_COMMUNITY): Payer: Self-pay | Admitting: Neurosurgery

## 2019-06-09 ENCOUNTER — Other Ambulatory Visit: Payer: Self-pay | Admitting: Neurosurgery

## 2019-06-09 DIAGNOSIS — M5412 Radiculopathy, cervical region: Secondary | ICD-10-CM

## 2019-06-21 ENCOUNTER — Ambulatory Visit: Payer: Self-pay

## 2019-06-23 ENCOUNTER — Ambulatory Visit (HOSPITAL_COMMUNITY)
Admission: RE | Admit: 2019-06-23 | Discharge: 2019-06-23 | Disposition: A | Discharge status: Home / Self Care | Payer: Medicare HMO | Source: Ambulatory Visit | Attending: Neurosurgery | Admitting: Neurosurgery

## 2019-06-23 ENCOUNTER — Other Ambulatory Visit: Payer: Self-pay

## 2019-06-23 DIAGNOSIS — M5412 Radiculopathy, cervical region: Secondary | ICD-10-CM | POA: Diagnosis not present

## 2019-06-23 DIAGNOSIS — M4802 Spinal stenosis, cervical region: Secondary | ICD-10-CM | POA: Diagnosis not present

## 2019-07-01 ENCOUNTER — Other Ambulatory Visit: Payer: Self-pay | Admitting: Family Medicine

## 2019-07-02 ENCOUNTER — Encounter: Payer: Self-pay | Admitting: Family Medicine

## 2019-07-02 ENCOUNTER — Ambulatory Visit (INDEPENDENT_AMBULATORY_CARE_PROVIDER_SITE_OTHER): Payer: Medicare HMO | Admitting: Family Medicine

## 2019-07-02 ENCOUNTER — Other Ambulatory Visit: Payer: Self-pay

## 2019-07-02 VITALS — BP 138/72 | HR 94 | Temp 99.2°F | Resp 16 | Ht 60.0 in | Wt 122.0 lb

## 2019-07-02 DIAGNOSIS — M810 Age-related osteoporosis without current pathological fracture: Secondary | ICD-10-CM | POA: Diagnosis not present

## 2019-07-02 DIAGNOSIS — E559 Vitamin D deficiency, unspecified: Secondary | ICD-10-CM | POA: Diagnosis not present

## 2019-07-02 DIAGNOSIS — B009 Herpesviral infection, unspecified: Secondary | ICD-10-CM | POA: Diagnosis not present

## 2019-07-02 MED ORDER — VALACYCLOVIR HCL 1 G PO TABS: 1000.0000 mg | ORAL_TABLET | Freq: Two times a day (BID) | ORAL | 0 refills | Status: DC

## 2019-07-02 NOTE — Patient Instructions (Signed)
F/U 4 months for physical

## 2019-07-02 NOTE — Progress Notes (Signed)
   Subjective:    Patient ID: [name] [name], female    DOB: 1955-12-25, 64 y.o.   MRN: [id number]  Patient presents for Rash to Buttocks (x3 weeks- R sided irritation- itchy)  Pt here with itchy rash on right buttocks, has had recurrence of this rash over the past year. She has used tucks , cortisone, yeast cream no improvement.  She was very concerned about getting prolia due to current rash No change in bowel or bladder    Review Of Systems:  GEN- denies fatigue, fever, weight loss,weakness, recent illness HEENT- denies eye drainage, change in vision, nasal discharge, CVS- denies chest pain, palpitations RESP- denies SOB, cough, wheeze ABD- denies N/V, change in stools, abd pain GU- denies dysuria, hematuria, dribbling, incontinence MSK- denies joint pain, muscle aches, injury Neuro- denies headache, dizziness, syncope, seizure activity       Objective:    BP 138/72   Pulse 94   Temp 99.2 F (37.3 C) (Temporal)   Resp 16   Ht 5' (1.524 m)   Wt 122 lb (55.3 kg)   SpO2 99%   BMI 23.83 kg/m  GEN- NAD, alert and oriented x3 CVS- RRR, no murmur RESP-CTAB Skin - Right buttcks, shallow based ulceration with mild erythema around, 3 smaller satellite lesions beneath, no pustules or blisters  mild TTP        Assessment & Plan:      Problem List Items Addressed This Visit      Unprioritized   HSV (herpes simplex virus) infection    Start valtrex, appears to be herpes outbreak      Relevant Medications   valACYclovir (VALTREX) 1000 MG tablet   Other Relevant Orders   HSV(herpes simplex vrs) 1+2 ab-IgG   Osteoporosis - Primary    Hold on prolia today, pt does not wantinjection Needs labs for endocrinology will obtain      Relevant Orders   Comprehensive metabolic panel (Completed)    Other Visit Diagnoses    Vitamin D deficiency       Relevant Orders   Vitamin D, 25-hydroxy (Completed)      Note: This dictation was prepared with Dragon dictation along  with smaller phrase technology. Any transcriptional errors that result from this process are unintentional.

## 2019-07-04 ENCOUNTER — Encounter: Payer: Self-pay | Admitting: Family Medicine

## 2019-07-04 NOTE — Assessment & Plan Note (Signed)
Start valtrex, appears to be herpes outbreak

## 2019-07-04 NOTE — Assessment & Plan Note (Signed)
Hold on prolia today, pt does not wantinjection Needs labs for endocrinology will obtain

## 2019-07-05 LAB — VITAMIN D 25 HYDROXY (VIT D DEFICIENCY, FRACTURES): Vit D, 25-Hydroxy: 32 ng/mL (ref 30–100)

## 2019-07-05 LAB — COMPREHENSIVE METABOLIC PANEL
AG Ratio: 1.9 (calc) (ref 1.0–2.5)
ALT: 24 U/L (ref 6–29)
AST: 44 U/L — ABNORMAL HIGH (ref 10–35)
Albumin: 4.4 g/dL (ref 3.6–5.1)
Alkaline phosphatase (APISO): 82 U/L (ref 37–153)
BUN: 9 mg/dL (ref 7–25)
CO2: 25 mmol/L (ref 20–32)
Calcium: 9.7 mg/dL (ref 8.6–10.4)
Chloride: 101 mmol/L (ref 98–110)
Creat: 0.53 mg/dL (ref 0.50–0.99)
Globulin: 2.3 g/dL (calc) (ref 1.9–3.7)
Glucose, Bld: 73 mg/dL (ref 65–99)
Potassium: 4.3 mmol/L (ref 3.5–5.3)
Sodium: 139 mmol/L (ref 135–146)
Total Bilirubin: 0.4 mg/dL (ref 0.2–1.2)
Total Protein: 6.7 g/dL (ref 6.1–8.1)

## 2019-07-05 LAB — HSV(HERPES SIMPLEX VRS) I + II AB-IGG
HAV 1 IGG,TYPE SPECIFIC AB: 12.6 index — ABNORMAL HIGH
HSV 2 IGG,TYPE SPECIFIC AB: 15.3 index — ABNORMAL HIGH

## 2019-07-08 DIAGNOSIS — M4802 Spinal stenosis, cervical region: Secondary | ICD-10-CM | POA: Diagnosis not present

## 2019-07-08 DIAGNOSIS — M542 Cervicalgia: Secondary | ICD-10-CM | POA: Diagnosis not present

## 2019-07-08 DIAGNOSIS — M4712 Other spondylosis with myelopathy, cervical region: Secondary | ICD-10-CM | POA: Diagnosis not present

## 2019-07-26 DIAGNOSIS — Z79899 Other long term (current) drug therapy: Secondary | ICD-10-CM | POA: Diagnosis not present

## 2019-07-26 DIAGNOSIS — R109 Unspecified abdominal pain: Secondary | ICD-10-CM | POA: Diagnosis not present

## 2019-07-26 DIAGNOSIS — M542 Cervicalgia: Secondary | ICD-10-CM | POA: Diagnosis not present

## 2019-07-26 DIAGNOSIS — F419 Anxiety disorder, unspecified: Secondary | ICD-10-CM | POA: Diagnosis not present

## 2019-07-26 DIAGNOSIS — M545 Low back pain: Secondary | ICD-10-CM | POA: Diagnosis not present

## 2019-08-03 DIAGNOSIS — L258 Unspecified contact dermatitis due to other agents: Secondary | ICD-10-CM | POA: Diagnosis not present

## 2019-08-12 ENCOUNTER — Other Ambulatory Visit: Payer: Self-pay | Admitting: Family Medicine

## 2019-08-25 ENCOUNTER — Telehealth (INDEPENDENT_AMBULATORY_CARE_PROVIDER_SITE_OTHER): Payer: Medicare HMO | Admitting: Family Medicine

## 2019-08-25 ENCOUNTER — Encounter: Payer: Self-pay | Admitting: Family Medicine

## 2019-08-25 DIAGNOSIS — Z7189 Other specified counseling: Secondary | ICD-10-CM

## 2019-08-25 DIAGNOSIS — T7840XA Allergy, unspecified, initial encounter: Secondary | ICD-10-CM

## 2019-08-25 MED ORDER — PREDNISONE 20 MG PO TABS: ORAL_TABLET | ORAL | 0 refills | Status: DC

## 2019-08-25 NOTE — Progress Notes (Signed)
Virtual Visit via Telephone Note  I connected with [name] [name] on 08/25/19 at 1:15pm by telephone and verified that I am speaking with the correct person using two identifiers.      Pt location: at home   Physician location:  In office, [facility], [name] MD     On call: patient and physician   I discussed the limitations, risks, security and privacy concerns of performing an evaluation and management service by telephone and the availability of in person appointments. I also discussed with the patient that there may be a patient responsible charge related to this service. The patient expressed understanding and agreed to proceed.   History of Present Illness:  She received COVID-19 injection on April 9th she felt good for the first 24 hours then developed sore throat felt like her throat was closing up on her.  States that she was having cough and difficulty getting mucus down.  She did try taking some Benadryl.  She did not go to the emergency room.  She did notice some swelling on the side of her tongue and the redness in the back of her throat.  When she brushes her teeth she noted a little bit of blood.  She has not had any wheezing.  She is able to eat and drink normally.  She stopped the Benadryl because it which is making her very sleepy.  But she still has sore throat feels like things are swelling intermittently.  She does not think she has any thrush does have nystatin on hand as needed.  Complained of some lower back pain on the left side.  States that she is not had any change in urination or bowels is more likely 8.  No particular injury.  This also started after she vaccine.  Denies any leg swelling.  No chest pain owels at baseline    Seen by dermatology- for buttocks lesion, diagnosed with  HSV    Observations/Objective: Unable to visualize as a telehealth visit.  Normal work of breathing no cough heard no wheezing  Assessment and Plan:  Recommend she Forgo next COVID-19 vaccine as she states all her symptoms started directly after getting it.  I am concerned that she describes sensation of throat swelling.  She did not want to get a second anyway.  Possible pharyngitis allergic reaction we will give her prednisone as she is still experiencing some symptoms.  Does not sound like she is having an acute COPD exacerbation at this time. The back discomfort states it feels like an ache no urinary symptoms.  May improve with the prednisone if not she comes to the office to have this evaluated.    Follow Up Instructions:    I discussed the assessment and treatment plan with the patient. The patient was provided an opportunity to ask questions and all were answered. The patient agreed with the plan and demonstrated an understanding of the instructions.   The patient was advised to call back or seek an in-person evaluation if the symptoms worsen or if the condition fails to improve as anticipated.  I provided 16 minutes of non-face-to-face time during this encounter. End Time: 1:31pm  [name], MD

## 2019-08-30 ENCOUNTER — Other Ambulatory Visit: Payer: Self-pay | Admitting: Gastroenterology

## 2019-08-31 ENCOUNTER — Telehealth: Payer: Self-pay

## 2019-08-31 NOTE — Telephone Encounter (Signed)
Refill request received from [facility] for Cholestyram 4 mg, dissolve and take 2 gms po bid with a meal. Do not take within 2 hrs of other medications.

## 2019-09-02 ENCOUNTER — Ambulatory Visit: Payer: Medicare Other | Admitting: "Endocrinology

## 2019-09-02 DIAGNOSIS — Z79891 Long term (current) use of opiate analgesic: Secondary | ICD-10-CM | POA: Diagnosis not present

## 2019-09-02 DIAGNOSIS — G894 Chronic pain syndrome: Secondary | ICD-10-CM | POA: Diagnosis not present

## 2019-09-02 DIAGNOSIS — R1083 Colic: Secondary | ICD-10-CM | POA: Diagnosis not present

## 2019-09-02 DIAGNOSIS — M542 Cervicalgia: Secondary | ICD-10-CM | POA: Diagnosis not present

## 2019-09-02 DIAGNOSIS — M545 Low back pain: Secondary | ICD-10-CM | POA: Diagnosis not present

## 2019-09-02 DIAGNOSIS — Z79899 Other long term (current) drug therapy: Secondary | ICD-10-CM | POA: Diagnosis not present

## 2019-09-02 NOTE — Telephone Encounter (Signed)
Rx sent 

## 2019-09-03 NOTE — Telephone Encounter (Signed)
Noted  

## 2019-09-09 ENCOUNTER — Other Ambulatory Visit: Payer: Self-pay | Admitting: Family Medicine

## 2019-09-15 ENCOUNTER — Telehealth: Payer: Self-pay | Admitting: "Endocrinology

## 2019-09-15 DIAGNOSIS — I10 Essential (primary) hypertension: Secondary | ICD-10-CM

## 2019-09-15 DIAGNOSIS — E559 Vitamin D deficiency, unspecified: Secondary | ICD-10-CM

## 2019-09-15 NOTE — Telephone Encounter (Signed)
Also, patient wants to know if she can do her proila here. See other phone note

## 2019-09-15 NOTE — Telephone Encounter (Signed)
She needs a new set of labs , CMP before her next Prolia shot.

## 2019-09-15 NOTE — Telephone Encounter (Signed)
Can you use her labs she did at her PMD in February. She did not do the proila injection because she had an infection on her buttocks.

## 2019-09-15 NOTE — Telephone Encounter (Signed)
Can you change it to [facility]

## 2019-09-15 NOTE — Telephone Encounter (Signed)
Labs updated and changed to [facility], discussed with pt the need for her to have her labs performed before we could give her the prolia injection. Understanding voiced.

## 2019-09-15 NOTE — Telephone Encounter (Signed)
Can you update labs

## 2019-09-16 ENCOUNTER — Ambulatory Visit: Payer: Medicare HMO | Admitting: "Endocrinology

## 2019-09-21 ENCOUNTER — Encounter: Payer: Self-pay | Admitting: General Practice

## 2019-09-23 ENCOUNTER — Other Ambulatory Visit: Payer: Self-pay | Admitting: Family Medicine

## 2019-09-28 ENCOUNTER — Encounter: Payer: Self-pay | Admitting: Family Medicine

## 2019-09-28 ENCOUNTER — Ambulatory Visit (INDEPENDENT_AMBULATORY_CARE_PROVIDER_SITE_OTHER): Payer: Medicare Other | Admitting: Family Medicine

## 2019-09-28 ENCOUNTER — Other Ambulatory Visit: Payer: Self-pay

## 2019-09-28 ENCOUNTER — Ambulatory Visit (HOSPITAL_COMMUNITY)
Admission: RE | Admit: 2019-09-28 | Discharge: 2019-09-28 | Disposition: A | Discharge status: Home / Self Care | Payer: Medicare Other | Source: Ambulatory Visit | Attending: Family Medicine | Admitting: Family Medicine

## 2019-09-28 VITALS — BP 128/82 | HR 100 | Temp 98.4°F | Resp 14 | Ht 60.0 in | Wt 117.0 lb

## 2019-09-28 DIAGNOSIS — R109 Unspecified abdominal pain: Secondary | ICD-10-CM | POA: Diagnosis present

## 2019-09-28 DIAGNOSIS — R10A0 Flank pain, unspecified side: Secondary | ICD-10-CM

## 2019-09-28 DIAGNOSIS — J358 Other chronic diseases of tonsils and adenoids: Secondary | ICD-10-CM

## 2019-09-28 DIAGNOSIS — K143 Hypertrophy of tongue papillae: Secondary | ICD-10-CM

## 2019-09-28 DIAGNOSIS — N390 Urinary tract infection, site not specified: Secondary | ICD-10-CM | POA: Diagnosis not present

## 2019-09-28 LAB — URINALYSIS, ROUTINE W REFLEX MICROSCOPIC
Bilirubin Urine: NEGATIVE
Glucose, UA: NEGATIVE
Hgb urine dipstick: NEGATIVE
Hyaline Cast: NONE SEEN /LPF
Nitrite: NEGATIVE
RBC / HPF: NONE SEEN /HPF (ref 0–2)
Specific Gravity, Urine: 1.015 (ref 1.001–1.03)
pH: 6 (ref 5.0–8.0)

## 2019-09-28 LAB — MICROSCOPIC MESSAGE

## 2019-09-28 MED ORDER — CEPHALEXIN 500 MG PO CAPS: 500.0000 mg | ORAL_CAPSULE | Freq: Two times a day (BID) | ORAL | 0 refills | Status: DC

## 2019-09-28 NOTE — Progress Notes (Signed)
   Subjective:    Patient ID: [name] [name], female    DOB: 06/23/1955, 64 y.o.   MRN: [id number]  Patient presents for L Sided Back Pain and Bumps to throat  Left sided back discomfort, she had before and it was a UTI so she came for a visit.  No dysuria, no blood, normal bowel movements for her due to Crohn's.  No fever. No chills, no body aches  Feels different from her chronic back pain, Percocet does not help   She states she has bumps on her tongue, she states they went down when she was on the steroids from our telehealth visit in April.  She is eating at baseline States her appetite has decreased    Review Of Systems:  GEN- denies fatigue, fever, weight loss,weakness, recent illness HEENT- denies eye drainage, change in vision, nasal discharge, CVS- denies chest pain, palpitations RESP- denies SOB, cough, wheeze ABD- denies N/V, change in stools, abd pain GU- denies dysuria, hematuria, dribbling, incontinence MSK- + joint pain, muscle aches, injury Neuro- denies headache, dizziness, syncope, seizure activity       Objective:    BP 128/82   Pulse 100   Temp 98.4 F (36.9 C) (Temporal)   Resp 14   Ht 5' (1.524 m)   Wt 117 lb (53.1 kg)   SpO2 92%   BMI 22.85 kg/m  GEN- NAD, alert and oriented x3 HEENT- PERRL, EOMI, non injected sclera, pink conjunctiva, MMM, oropharynx clear, NT cankor sore left roof of mouth, small cyst in right tonsil, enlarged papillae post tongue  Neck- Supple, no thyromegaly CVS- RRR, no murmur RESP-CTAB ABD-NABS,soft ,ND, TTP Left CVA , no suprapubic tenderness  EXT- No edema Pulses- Radial, DP- 2+        Assessment & Plan:      Problem List Items Addressed This Visit    None    Visit Diagnoses    Flank pain    -  Primary   Relevant Orders   Urinalysis, Routine w reflex microscopic (Completed)   DG Abd 2 Views (Completed)   Tonsillar cyst       Referral to ENT, I dont see anything abnormal about the enlarged papillae, can  use salt water gargle   Relevant Orders   Ambulatory referral to ENT   Urinary tract infection without hematuria, site unspecified       Treat for UTI though not severe appearing based on UA   Increase hydration as ketones seen as well, KUB did not show any kidney stone as reason for flank pain, has had UTI in past with similar symptoms   Hypertrophy, tongue, papillae       Relevant Orders   Ambulatory referral to ENT      Note: This dictation was prepared with Dragon dictation along with smaller phrase technology. Any transcriptional errors that result from this process are unintentional.

## 2019-09-28 NOTE — Patient Instructions (Addendum)
Go get the xray  Referral to ENT Salt water gargle  F/U as previous

## 2019-10-19 ENCOUNTER — Other Ambulatory Visit: Payer: Self-pay

## 2019-10-19 ENCOUNTER — Encounter: Payer: Self-pay | Admitting: Family Medicine

## 2019-10-19 ENCOUNTER — Ambulatory Visit (INDEPENDENT_AMBULATORY_CARE_PROVIDER_SITE_OTHER): Payer: Medicare Other | Admitting: Family Medicine

## 2019-10-19 VITALS — BP 130/88 | HR 68 | Temp 98.1°F | Resp 14 | Ht 60.0 in | Wt 117.0 lb

## 2019-10-19 DIAGNOSIS — R300 Dysuria: Secondary | ICD-10-CM

## 2019-10-19 DIAGNOSIS — Z981 Arthrodesis status: Secondary | ICD-10-CM

## 2019-10-19 DIAGNOSIS — M62830 Muscle spasm of back: Secondary | ICD-10-CM | POA: Diagnosis not present

## 2019-10-19 LAB — MICROSCOPIC MESSAGE

## 2019-10-19 LAB — URINALYSIS, ROUTINE W REFLEX MICROSCOPIC
Bacteria, UA: NONE SEEN /HPF
Bilirubin Urine: NEGATIVE
Glucose, UA: NEGATIVE
Hgb urine dipstick: NEGATIVE
Hyaline Cast: NONE SEEN /LPF
Nitrite: NEGATIVE
Protein, ur: NEGATIVE
RBC / HPF: NONE SEEN /HPF (ref 0–2)
Specific Gravity, Urine: 1.015 (ref 1.001–1.03)
pH: 5.5 (ref 5.0–8.0)

## 2019-10-19 NOTE — Patient Instructions (Addendum)
Schedule with Dr. Arnoldo Morale Use flexeril We will call with lab results  F/U 4 months Physical

## 2019-10-19 NOTE — Progress Notes (Signed)
   Subjective:    Patient ID: [name] [name], female    DOB: 04-10-1956, 64 y.o.   MRN: [id number]  Patient presents for Dysuria (dull ache in L flank)   Pt here with left side pain.  She feels a knot on her back.  Her husband is concerned it may have been a kidney stone or a muscle as he can feel it.  They use ice pack and Aspercreme which actually helped the pain.  She want to have her urine rechecked that she was treated for a urinary tract infection about a month ago.  She states that she had dark urine and some diarrhea stools when she was on the antibiotics but that has resolved.  She had urine left which sounds like it was for her routine urine drug screen with her pain clinic couple days ago and that was normal.  She had dark brown urine and she had diarrheal stools when she was on the lasat anitbiotic  She has been taking more her Percocet with her pain clinic increased due to her back discomfort.  She does have appointment with her back surgeon Dr. Arnoldo Morale   She now remembers she has been pulling weeds and mopping doing things outside that often aggravate the pain    Review Of Systems:  GEN- denies fatigue, fever, weight loss,weakness, recent illness HEENT- denies eye drainage, change in vision, nasal discharge, CVS- denies chest pain, palpitations RESP- denies SOB, cough, wheeze ABD- denies N/V, change in stools, abd pain GU- denies dysuria, hematuria, dribbling, incontinence MSK- + joint pain, +muscle aches, injury Neuro- denies headache, dizziness, syncope, seizure activity       Objective:    BP 130/88   Pulse 68   Temp 98.1 F (36.7 C) (Temporal)   Resp 14   Ht 5' (1.524 m)   Wt 117 lb (53.1 kg)   SpO2 96%   BMI 22.85 kg/m  GEN- NAD, alert and oriented x3 CVS- RRR, no murmur RESP-CTAB ABD-NABS,soft,NT,ND MSK- TTP Left paraspinal,spasm, decreased ROM Lumbar spine, neg SLR   mild antalgic gait  EXT- No edema Pulses- Radial, DP- 2+        Assessment &  Plan:      Problem List Items Addressed This Visit      Unprioritized   S/P lumbar fusion    Back pain with history of lumbar fusion UA unremarkable has spasm noted She has not been taking her muscle relaxers or valium recently Advised to take flexeril at bedtime Narcotics per pain clinic Heat or ice Topical rubs which helped Avoid further strain She can f/u with her surgeon       Other Visit Diagnoses    Dysuria    -  Primary   Relevant Orders   Urinalysis, Routine w reflex microscopic (Completed)   Urine Culture   Spasm of muscle of lower back          Note: This dictation was prepared with Dragon dictation along with smaller phrase technology. Any transcriptional errors that result from this process are unintentional.

## 2019-10-19 NOTE — Assessment & Plan Note (Signed)
Back pain with history of lumbar fusion UA unremarkable has spasm noted She has not been taking her muscle relaxers or valium recently Advised to take flexeril at bedtime Narcotics per pain clinic Heat or ice Topical rubs which helped Avoid further strain She can f/u with her surgeon

## 2019-10-20 ENCOUNTER — Other Ambulatory Visit: Payer: Self-pay | Admitting: Family Medicine

## 2019-10-20 LAB — URINE CULTURE
MICRO NUMBER:: 10592886
SPECIMEN QUALITY:: ADEQUATE

## 2019-11-17 ENCOUNTER — Ambulatory Visit: Payer: Medicare Other | Admitting: Gastroenterology

## 2019-11-17 ENCOUNTER — Other Ambulatory Visit: Payer: Self-pay | Admitting: Family Medicine

## 2019-12-01 ENCOUNTER — Other Ambulatory Visit: Payer: Self-pay | Admitting: *Deleted

## 2019-12-01 MED ORDER — ALBUTEROL SULFATE HFA 108 (90 BASE) MCG/ACT IN AERS: 2.0000 | INHALATION_SPRAY | Freq: Four times a day (QID) | RESPIRATORY_TRACT | 11 refills | Status: DC | PRN

## 2019-12-09 DIAGNOSIS — M542 Cervicalgia: Secondary | ICD-10-CM | POA: Diagnosis not present

## 2019-12-09 DIAGNOSIS — Z79891 Long term (current) use of opiate analgesic: Secondary | ICD-10-CM | POA: Diagnosis not present

## 2019-12-09 DIAGNOSIS — M545 Low back pain: Secondary | ICD-10-CM | POA: Diagnosis not present

## 2019-12-09 DIAGNOSIS — G894 Chronic pain syndrome: Secondary | ICD-10-CM | POA: Diagnosis not present

## 2019-12-10 ENCOUNTER — Other Ambulatory Visit: Payer: Self-pay | Admitting: Family Medicine

## 2019-12-19 ENCOUNTER — Other Ambulatory Visit: Payer: Self-pay | Admitting: Family Medicine

## 2020-01-02 ENCOUNTER — Other Ambulatory Visit: Payer: Self-pay | Admitting: Family Medicine

## 2020-01-03 ENCOUNTER — Ambulatory Visit (INDEPENDENT_AMBULATORY_CARE_PROVIDER_SITE_OTHER): Payer: Medicare Other | Admitting: Nurse Practitioner

## 2020-01-03 NOTE — Progress Notes (Signed)
erroneous

## 2020-01-04 ENCOUNTER — Encounter: Payer: Self-pay | Admitting: Family Medicine

## 2020-01-04 ENCOUNTER — Ambulatory Visit (INDEPENDENT_AMBULATORY_CARE_PROVIDER_SITE_OTHER): Payer: Medicare Other | Admitting: Family Medicine

## 2020-01-04 ENCOUNTER — Other Ambulatory Visit: Payer: Self-pay

## 2020-01-04 VITALS — BP 120/72 | HR 93 | Temp 98.6°F | Resp 18 | Wt 112.4 lb

## 2020-01-04 DIAGNOSIS — N760 Acute vaginitis: Secondary | ICD-10-CM

## 2020-01-04 DIAGNOSIS — B370 Candidal stomatitis: Secondary | ICD-10-CM | POA: Diagnosis not present

## 2020-01-04 LAB — WET PREP FOR TRICH, YEAST, CLUE

## 2020-01-04 MED ORDER — CLOTRIMAZOLE 1 % EX CREA: 1.0000 "application " | TOPICAL_CREAM | Freq: Two times a day (BID) | CUTANEOUS | 0 refills | Status: AC

## 2020-01-04 MED ORDER — HYDROCORTISONE 2.5 % EX CREA: TOPICAL_CREAM | Freq: Two times a day (BID) | CUTANEOUS | 0 refills | Status: DC

## 2020-01-04 NOTE — Progress Notes (Signed)
   Subjective:    Patient ID: [name] [name], female    DOB: October 07, 1955, 64 y.o.   MRN: [id number]  Patient presents for vaginal rash (on the outside, putting fluticason 0.05% cream on it, started x1 week, itchy, when wiping, yellow substance on toilet paper) and Thrush (sore throat)  Pt here with vaginal irritation for the past week She has been using Fluticaone 0.05% She thought it was for bacteria No discharge, no bleeding She has been seen by Dermatology for this on and off  She had thrush, when she brushes she gag easily Feels like er throat is closing up on her, these are same symptoms as last visit  She was referred to ENT she never heard , she had tonsilar cyst and hypertrophy of the papillae noted on my exam       Review Of Systems:  GEN- denies fatigue, fever, weight loss,weakness, recent illness HEENT- denies eye drainage, change in vision, nasal discharge, CVS- denies chest pain, palpitations RESP- denies SOB, cough, wheeze ABD- denies N/V, change in stools, abd pain GU- denies dysuria, hematuria, dribbling, incontinence MSK- denies joint pain, muscle aches, injury Neuro- denies headache, dizziness, syncope, seizure activity       Objective:    BP 120/72 (BP Location: Right Arm, Patient Position: Sitting, Cuff Size: Normal)   Pulse 93   Temp 98.6 F (37 C) (Temporal)   Resp 18   Wt 112 lb 6.4 oz (51 kg)   SpO2 97%   BMI 21.95 kg/m  GEN- NAD, alert and oriented x3 HEENT- PERRL, EOMI, non injected sclera, pink conjunctiva, MMM, oropharynx clear,small cyst in right tonsil, enlarged papillae post tongue , mininal white discharge post tongue  Neck- Supple, no thyromegaly CVS- RRR, no murmur RESP-CTAB ABD-NABS,soft,NT,ND GU- normal external genitalia,  Erythema of labia majora bilat, no ulcerations vaginal mucosa pink and moist, cervix visualized no growth, no blood form os, minimal  discharge, no CMT, no ovarian masses, uterus normal size EXT- No  edema Pulses- Radial 2+        Assessment & Plan:      Problem List Items Addressed This Visit    None    Visit Diagnoses    Acute vaginitis    -  Primary   Wet pre neg, no ulcerated lesions, generalized erythema, stop using fluticaone topical, add clotrimazole, with hydrocortisone 2.5% BID for irritation, avoid harsh soaps detergent   Relevant Orders   WET PREP FOR [place], YEAST, CLUE (Completed)   Thrush       MINIMAL noted on exam, ENT was scheduled today during OV for her chronic throat issues, nystatin liquid prn    Relevant Medications   clotrimazole (CLOTRIMAZOLE AF) 1 % cream      Note: This dictation was prepared with Dragon dictation along with smaller phrase technology. Any transcriptional errors that result from this process are unintentional.

## 2020-01-04 NOTE — Patient Instructions (Signed)
We will f/u with ENT Use the nystatin  F/U as needed

## 2020-01-11 ENCOUNTER — Other Ambulatory Visit: Payer: Self-pay | Admitting: Family Medicine

## 2020-01-28 ENCOUNTER — Encounter: Payer: Self-pay | Admitting: Gastroenterology

## 2020-01-28 ENCOUNTER — Other Ambulatory Visit: Payer: Self-pay

## 2020-01-28 ENCOUNTER — Ambulatory Visit (INDEPENDENT_AMBULATORY_CARE_PROVIDER_SITE_OTHER): Payer: Medicare Other | Admitting: Gastroenterology

## 2020-01-28 ENCOUNTER — Telehealth: Payer: Self-pay | Admitting: *Deleted

## 2020-01-28 VITALS — BP 133/81 | HR 90 | Temp 97.2°F | Ht 60.0 in | Wt 112.8 lb

## 2020-01-28 DIAGNOSIS — R1011 Right upper quadrant pain: Secondary | ICD-10-CM

## 2020-01-28 MED ORDER — HYOSCYAMINE SULFATE 0.125 MG SL SUBL: 0.1250 mg | SUBLINGUAL_TABLET | Freq: Four times a day (QID) | SUBLINGUAL | 0 refills | Status: DC | PRN

## 2020-01-28 NOTE — Progress Notes (Signed)
Referring Provider: Alycia Rossetti, MD Primary Care Physician:  Alycia Rossetti, MD Primary GI: Dr. Abbey Chatters  Chief Complaint  Patient presents with  . Diarrhea    x sunday related to eating salad sunday  . Abdominal Pain    since eating salad on sunday  . Crohn's Disease    f/u.    HPI:   [name] [name] is a 64 y.o. female presenting today with a history of Crohn's disease s/p right hemicolectomy and ileal resection in 2006, chronic GERD, dysphagia s/p dilation in July 2020, elevated LFTs in the past, US abdomen Oct 2020 with fatty liver and likely small gallbladder polyp. Next colonoscopy due between 2024 and 2029.   Salad set her off on Sunday. Finally eased up on Thursday. Had diarrhea. Diarrhea resolved now. Was drinking sprite and water. Drank a beer to "flush out". Feels cramped up after having a spell. Intermittent RUQ pain. Worse after eating. Takes Dexilant daily. Feels it sometimes makes her abdominal pain worse. No overt GI bleeding. Intermittent diarrhea depending on food choices. She is taking Questran daily.   She is requesting lipid panel drawn with labs today.    Past Medical History:  Diagnosis Date  . Allergic rhinitis   . Anastomotic ulcer JAN 2009  . Anxiety   . Arthritis   . Asthma   . BMI (body mass index) 20.0-29.9 2009 121 lbs  . Concussion   . 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 Crohn disease ([place])   . CTS (carpal tunnel syndrome)   . Diarrhea MULITIFACTORIAL   IBS, LACTOSE INTOLERANCE, SBBO, BILE-SALT  . Elevated liver enzymes 2009: BMI 24 ?Etoh ALT 94, AST 51,  NEG ANA, qIGs& ASMA   MAY 2012 AST 52 ALT 38  . Esophageal stricture 2009  . GERD (gastroesophageal reflux disease)   . Hemorrhoid   . Hyperlipemia   . Hypertension   . Inflammatory bowel disease 2006 CD   SURGICAL REMISSION  . LBP (low back pain)   . Mental disorder   . Migraine   . Osteoporosis   . PONV (postoperative nausea and vomiting)   . Shortness of breath     exertion and humidity  . Shoulder pain    right  . Sleep apnea    Stop [name]    Past Surgical History:  Procedure Laterality Date  . BACK SURGERY    . BACK SURGERY  07/16/2018  . BILATERAL SALPINGOOPHORECTOMY    . BIOPSY  12/24/2011   Procedure: BIOPSY;  Surgeon: Danie Binder, MD;  Location: [facility];  Service: Endoscopy;;  Gastric Biopsies  . BIOPSY N/A 06/30/2012   Procedure: BIOPSY;  Surgeon: Danie Binder, MD;  Location: [facility];  Service: Endoscopy;  Laterality: N/A;  Gastric and Esophageal Biopsies  . CARPAL TUNNEL RELEASE  LEFT  . CATARACT EXTRACTION W/PHACO Right 07/30/2016   Procedure: CATARACT EXTRACTION PHACO AND INTRAOCULAR LENS PLACEMENT (IOC);  Surgeon: Rutherford Guys, MD;  Location: [facility];  Service: Ophthalmology;  Laterality: Right;  CDE: 5.45  . CATARACT EXTRACTION W/PHACO Left 08/13/2016   Procedure: CATARACT EXTRACTION PHACO AND INTRAOCULAR LENS PLACEMENT (IOC);  Surgeon: Rutherford Guys, MD;  Location: [facility];  Service: Ophthalmology;  Laterality: Left;  CDE: 5.59  . COLON SURGERY    . COLONOSCOPY  JAN 2009 DIARRHEA, ABD PAIN, BRBPR   ANASTOMOTIC ULCER(3MM), IH TD:VVOHYW ULCER, NL COLON bX  . COLONOSCOPY WITH PROPOFOL N/A 04/21/2018   PROPOFOL;  Surgeon: Danie Binder, MD; normal examined portion of the ileum, 2 tubular adenomas, diverticulosis in the rectosigmoid and sigmoid colon, external and internal hemorrhoids, significantly been of the colon.  Repeat colonoscopy in 5-10 years with peds colonoscope.  Marland Kitchen DILATION AND CURETTAGE OF UTERUS    . ESOPHAGEAL DILATION  12/24/2011   SLF: A stricture was found in the distal esophagus/ Moderate gastritis/ MILD Duodenitis  . ESOPHAGOGASTRODUODENOSCOPY  02/07/09   mild gastritis  . ESOPHAGOGASTRODUODENOSCOPY (EGD) WITH PROPOFOL N/A 06/30/2012   SLF: 1. No definite stricture appreciated 2. small hiatal hernia 3. Gastritis  . ESOPHAGOGASTRODUODENOSCOPY (EGD) WITH PROPOFOL N/A 02/20/2016   Procedure:  ESOPHAGOGASTRODUODENOSCOPY (EGD) WITH PROPOFOL;  Surgeon: Danie Binder, MD;  [facility];  Service: Endoscopy;  Laterality: N/A;  930  . ESOPHAGOGASTRODUODENOSCOPY (EGD) WITH PROPOFOL N/A 11/24/2018   PROPOFOL;  Surgeon: Danie Binder, MD; peptic stricture s/p dilation, small hiatal hernia, moderate erosive gastritis.  Marland Kitchen EYE SURGERY    . gum flap Bilateral   . HEMICOLECTOMY  RIGHT 2006  . HERNIA REPAIR    . LUMBAR DISC SURGERY    . POLYPECTOMY  04/21/2018   Procedure: POLYPECTOMY;  Surgeon: Danie Binder, MD;  [facility];  Service: Endoscopy;;  (colon)  . SAVORY DILATION N/A 06/30/2012   Procedure: SAVORY DILATION;  Surgeon: Danie Binder, MD;  Location: [facility];  Service: Endoscopy;  Laterality: N/A;  12.38m , 160m 1540m. SAVORY DILATION N/A 02/20/2016   Procedure: SAVORY DILATION;  Surgeon: SanDanie BinderD;  [facility];  Service: Endoscopy;  Laterality: N/A;  . SAVORY DILATION N/A 11/24/2018   Procedure: SAVORY DILATION;  Surgeon: FieDanie BinderD;  [facility];  Service: Endoscopy;  Laterality: N/A;  . UPPER GASTROINTESTINAL ENDOSCOPY  JAN 2009 ABD PAIN   GASTRITIS, ESO RING  . UPPER GASTROINTESTINAL ENDOSCOPY  OCT 2010 ABD PAIN, DYSPHAGIA   DIL 15MM, GASTRITIS, NL DUODENUM  . UPPER GASTROINTESTINAL ENDOSCOPY  OCT 2010 DYSPHAGIA   DIL 17 MM, GASTRITIS, NL DUODENUM  . vocal cord surgery  Sep 20, 2010   precancerous areas removed  . wisdom tooth extraction Right    pin placement due to broken jaw    Current Outpatient Medications  Medication Sig Dispense Refill  . albuterol (VENTOLIN HFA) 108 (90 Base) MCG/ACT inhaler Inhale 2 puffs into the lungs every 6 (six) hours as needed for wheezing or shortness of breath. 18 g 11  . amLODipine (NORVASC) 5 MG tablet Take 1 tablet by mouth once daily 90 tablet 1  . benzonatate (TESSALON) 200 MG capsule Take 1 capsule by mouth three times daily as needed for cough 30 capsule 0  .  calcium carbonate (TUMS - DOSED IN MG ELEMENTAL CALCIUM) 500 MG chewable tablet Chew 1 tablet (200 mg of elemental calcium total) by mouth 3 (three) times daily with meals. 90 tablet 6  . cholestyramine (QUESTRAN) 4 GM/DOSE powder DISSOLVE AND TAKE 2 GRAMS BY MOUTH TWICE DAILY WITH A MEAL. DO NOT TAKE WITHIN 2 HOURS OF OTHER MEDICATIONS. 378 g 0  . clotrimazole (CLOTRIMAZOLE AF) 1 % cream Apply 1 application topically 2 (two) times daily. To affected area for 1-2 weeks 30 g 0  . cyclobenzaprine (FLEXERIL) 10 MG tablet Take 10 mg by mouth as needed for muscle spasms.     . dMarland Kitchennosumab (PROLIA) 60 MG/ML SOSY injection Inject 60 mg into the skin every 6 (six) months.    .Marland Kitchen  dexlansoprazole (DEXILANT) 60 MG capsule 1 PO EVERY MORNING WITH BREAKFAST. (Patient taking differently: Take 60 mg by mouth daily with breakfast. ) 30 capsule 11  . diazepam (VALIUM) 5 MG tablet Take 5 mg by mouth every 12 (twelve) hours as needed for anxiety.    Marland Kitchen estradiol (ESTRACE VAGINAL) 0.1 MG/GM vaginal cream Place 5.00 Applicatorfuls vaginally 3 (three) times a week. This 0.5 g/ application 37.0 g 3  . fluticasone (FLONASE) 50 MCG/ACT nasal spray USE TWO SPRAY(S) IN EACH NOSTRIL ONCE DAILY AS NEEDED FOR ALLERGY OR RHINITIS (Patient taking differently: Place 2 sprays into both nostrils daily as needed for allergies or rhinitis. ) 16 g 2  . gabapentin (NEURONTIN) 400 MG capsule Take 1 capsule (400 mg total) by mouth 4 (four) times daily.    . hydrocortisone 2.5 % cream Apply topically 2 (two) times daily. To affected areas 30 g 0  . lactase (LACTAID) 3000 units tablet Take 3,000 Units by mouth daily as needed (lactose intolerance).    . Lidocaine HCl (ASPERCREME LIDOCAINE) 4 % LIQD Apply 1 application topically 3 (three) times daily as needed (pain).    Marland Kitchen loratadine (CLARITIN) 10 MG tablet Take 1 tablet (10 mg total) by mouth daily. (Patient taking differently: Take 10 mg by mouth daily as needed for allergies. ) 30 tablet 3  .  magnesium hydroxide (MILK OF MAGNESIA) 400 MG/5ML suspension Take 30 mLs by mouth daily as needed for mild constipation.    . Menthol (BIOFREEZE) 10 % AERO Apply 1 application topically 3 (three) times daily as needed (pain).    . nystatin (MYCOSTATIN) 100000 UNIT/ML suspension TAKE 5 ML BY MOUTH  4 TIMES DAILY AS NEEDED FOR THRUSH 500 mL 0  . oxyCODONE-acetaminophen (PERCOCET) 10-325 MG tablet Take 1 tablet by mouth every 6 (six) hours as needed for pain.     . SYMBICORT 80-4.5 MCG/ACT inhaler Inhale 2 puffs by mouth twice daily 11 g 0  . UNABLE TO FIND Capsule to rebuild cells-BID    . UNABLE TO FIND as needed. Penetrex cream    . valACYclovir (VALTREX) 1000 MG tablet Take 1 tablet (1,000 mg total) by mouth 2 (two) times daily. 14 tablet 0   No current facility-administered medications for this visit.    Allergies as of 01/28/2020 - Review Complete 01/28/2020  Allergen Reaction Noted  . Naproxen Shortness Of Breath 01/09/2016  . Eggs or egg-derived products Diarrhea 02/20/2018  . Lovastatin Other (See Comments) 09/25/2010  . Toradol [ketorolac tromethamine] Other (See Comments) 02/20/2016  . Tramadol Other (See Comments) 02/20/2016  . Nexium [esomeprazole magnesium] Diarrhea 06/12/2012    Family History  Problem Relation Age of Onset  . Hypothyroidism Mother   . Heart disease Father   . Parkinson's disease Father   . Hypothyroidism Daughter   . Heart attack Paternal Grandfather   . Alzheimer's disease Paternal Grandmother   . Heart attack Maternal Grandfather   . Crohn's disease Sister   . Other Sister        was murdered  . Crohn's disease Maternal Uncle   . Colon cancer Neg Hx   . Colon polyps Neg Hx     Social History   Socioeconomic History  . Marital status: Single    Spouse name: Powell  . Number of children: 2  . Years of education: GED  . Highest education level: Not on file  Occupational History    Comment: disabled  Tobacco Use  . Smoking status: Current  Every Day  Smoker    Packs/day: 0.25    Years: 30.00    Pack years: 7.50    Types: Cigarettes  . Smokeless tobacco: Never Used  Vaping Use  . Vaping Use: Never used  Substance and Sexual Activity  . Alcohol use: Yes    Comment: wine a few times a week   . Drug use: No  . Sexual activity: Not Currently    Birth control/protection: Post-menopausal  Other Topics Concern  . Not on file  Social History Narrative   Lives with husband   no caffiene   Social Determinants of Health   Financial Resource Strain:   . Difficulty of Paying Living Expenses: Not on file  Food Insecurity:   . Worried About Charity fundraiser in the Last Year: Not on file  . Ran Out of Food in the Last Year: Not on file  Transportation Needs:   . Lack of Transportation (Medical): Not on file  . Lack of Transportation (Non-Medical): Not on file  Physical Activity:   . Days of Exercise per Week: Not on file  . Minutes of Exercise per Session: Not on file  Stress:   . Feeling of Stress : Not on file  Social Connections:   . Frequency of Communication with Friends and Family: Not on file  . Frequency of Social Gatherings with Friends and Family: Not on file  . Attends Religious Services: Not on file  . Active Member of Clubs or Organizations: Not on file  . Attends Archivist Meetings: Not on file  . Marital Status: Not on file    Review of Systems: Gen: Denies fever, chills, anorexia. Denies fatigue, weakness, weight loss.  CV: Denies chest pain, palpitations, syncope, peripheral edema, and claudication. Resp: Denies dyspnea at rest, cough, wheezing, coughing up blood, and pleurisy. GI: see HPI Derm: Denies rash, itching, dry skin Psych: Denies depression, anxiety, memory loss, confusion. No homicidal or suicidal ideation.  Heme: Denies bruising, bleeding, and enlarged lymph nodes.  Physical Exam: BP 133/81   Pulse 90   Temp (!) 97.2 F (36.2 C) (Oral)   Ht 5' (1.524 m)   Wt 112 lb  12.8 oz (51.2 kg)   BMI 22.03 kg/m  General:   Alert and oriented. No distress noted. Pleasant and cooperative.  Head:  Normocephalic and atraumatic. Eyes:  Conjuctiva clear without scleral icterus. Mouth:  Mask in place Abdomen:  +BS, soft, mild TTP RUQ and non-distended. No rebound or guarding. No HSM or masses noted. Msk:  Symmetrical without gross deformities. Normal posture. Extremities:  Without edema. Neurologic:  Alert and  oriented x4 Psych:  Alert and cooperative. Normal mood and affect.  ASSESSMENT: [name] [name] is a 64 y.o. female presenting today with a history of Crohn's disease s/p right hemicolectomy and ileal resection in 2006, chronic GERD, chronic RUQ pain, dysphagia s/p dilation in July 2020, elevated LFTs in the past, US abdomen Oct 2020 with fatty liver and likely small gallbladder polyp, reporting flare of diarrhea after eating salad last week that has now resolved.   RUQ pain has been long-standing. Gallbladder remains in situ. History of gallbladder polyp. Will update RUQ [place] now and labs including HFP. May need HIDA.   I have also sent in Levsin to take prn abdominal cramping. Diarrhea has now resolved. Discussed avoidance of ETOH as well.    PLAN:  Labs today including CBC, BMP, HFP, lipase. Lipid panel at her request RUQ [place] Stop ETOH Levsin prn cramping  Call if recurrent diarrhea Return in 4-6 months  Annitta Needs, PhD, [facility]

## 2020-01-28 NOTE — Telephone Encounter (Signed)
ABD [place] scheduled for 9/27 at 8:30am, arrival 8:15am, npo midnight  Called pt and is aware of appt details. She voiced understanding.

## 2020-01-28 NOTE — Patient Instructions (Signed)
Please have blood work when you are able.  We are arranging an ultrasound of your liver and gallbladder in the near future.  I have sent in a medication called Levsin to take every 6 hours as needed for cramping. Monitor for constipation, dry mouth, and drowsiness. Let me know if this is not covered by the insurance.  We will see you in 4-6 months!   It was a pleasure to see you today. I want to create trusting relationships with patients to provide genuine, compassionate, and quality care. I value your feedback. If you receive a survey regarding your visit,  I greatly appreciate you taking time to fill this out.   Annitta Needs, PhD, ANP-BC [facility]

## 2020-01-31 ENCOUNTER — Ambulatory Visit (HOSPITAL_COMMUNITY)
Admission: RE | Admit: 2020-01-31 | Discharge: 2020-01-31 | Disposition: A | Discharge status: Home / Self Care | Payer: Medicare Other | Source: Ambulatory Visit | Attending: Gastroenterology | Admitting: Gastroenterology

## 2020-01-31 ENCOUNTER — Other Ambulatory Visit: Payer: Self-pay | Admitting: Family Medicine

## 2020-01-31 ENCOUNTER — Encounter: Payer: Self-pay | Admitting: Gastroenterology

## 2020-01-31 ENCOUNTER — Other Ambulatory Visit (HOSPITAL_COMMUNITY)
Admission: RE | Admit: 2020-01-31 | Discharge: 2020-01-31 | Disposition: A | Discharge status: Home / Self Care | Payer: Medicare Other | Source: Ambulatory Visit | Attending: Gastroenterology | Admitting: Gastroenterology

## 2020-01-31 ENCOUNTER — Other Ambulatory Visit: Payer: Self-pay

## 2020-01-31 DIAGNOSIS — K824 Cholesterolosis of gallbladder: Secondary | ICD-10-CM | POA: Insufficient documentation

## 2020-01-31 DIAGNOSIS — K760 Fatty (change of) liver, not elsewhere classified: Secondary | ICD-10-CM | POA: Diagnosis not present

## 2020-01-31 DIAGNOSIS — R1011 Right upper quadrant pain: Secondary | ICD-10-CM

## 2020-01-31 DIAGNOSIS — K8020 Calculus of gallbladder without cholecystitis without obstruction: Secondary | ICD-10-CM | POA: Diagnosis not present

## 2020-01-31 LAB — CBC WITH DIFFERENTIAL/PLATELET
Abs Immature Granulocytes: 0 10*3/uL (ref 0.00–0.07)
Basophils Absolute: 0 10*3/uL (ref 0.0–0.1)
Basophils Relative: 1 %
Eosinophils Absolute: 0.1 10*3/uL (ref 0.0–0.5)
Eosinophils Relative: 5 %
HCT: 43.3 % (ref 36.0–46.0)
Hemoglobin: 15.1 g/dL — ABNORMAL HIGH (ref 12.0–15.0)
Immature Granulocytes: 0 %
Lymphocytes Relative: 48 %
Lymphs Abs: 1.3 10*3/uL (ref 0.7–4.0)
MCH: 36.5 pg — ABNORMAL HIGH (ref 26.0–34.0)
MCHC: 34.9 g/dL (ref 30.0–36.0)
MCV: 104.6 fL — ABNORMAL HIGH (ref 80.0–100.0)
Monocytes Absolute: 0.4 10*3/uL (ref 0.1–1.0)
Monocytes Relative: 16 %
Neutro Abs: 0.8 10*3/uL — ABNORMAL LOW (ref 1.7–7.7)
Neutrophils Relative %: 30 %
Platelets: 175 10*3/uL (ref 150–400)
RBC: 4.14 MIL/uL (ref 3.87–5.11)
RDW: 14.1 % (ref 11.5–15.5)
WBC: 2.7 10*3/uL — ABNORMAL LOW (ref 4.0–10.5)
nRBC: 0 % (ref 0.0–0.2)

## 2020-01-31 LAB — HEPATIC FUNCTION PANEL
ALT: 33 U/L (ref 0–44)
AST: 47 U/L — ABNORMAL HIGH (ref 15–41)
Albumin: 3.9 g/dL (ref 3.5–5.0)
Alkaline Phosphatase: 120 U/L (ref 38–126)
Bilirubin, Direct: 0.1 mg/dL (ref 0.0–0.2)
Indirect Bilirubin: 0.5 mg/dL (ref 0.3–0.9)
Total Bilirubin: 0.6 mg/dL (ref 0.3–1.2)
Total Protein: 6.9 g/dL (ref 6.5–8.1)

## 2020-01-31 LAB — LIPID PANEL
Cholesterol: 271 mg/dL — ABNORMAL HIGH (ref 0–200)
HDL: >135 mg/dL (ref >40)
Triglycerides: 73 mg/dL (ref <150)
VLDL: 15 mg/dL (ref 0–40)

## 2020-01-31 LAB — BASIC METABOLIC PANEL
Anion gap: 12 (ref 5–15)
BUN: 5 mg/dL — ABNORMAL LOW (ref 8–23)
CO2: 26 mmol/L (ref 22–32)
Calcium: 8.8 mg/dL — ABNORMAL LOW (ref 8.9–10.3)
Chloride: 100 mmol/L (ref 98–111)
Creatinine, Ser: 0.42 mg/dL — ABNORMAL LOW (ref 0.44–1.00)
GFR calc Af Amer: >60 mL/min (ref >60)
GFR calc non Af Amer: >60 mL/min (ref >60)
Glucose, Bld: 80 mg/dL (ref 70–99)
Potassium: 3.7 mmol/L (ref 3.5–5.1)
Sodium: 138 mmol/L (ref 135–145)

## 2020-01-31 NOTE — Progress Notes (Signed)
Cc'ed to pcp °

## 2020-02-01 ENCOUNTER — Other Ambulatory Visit: Payer: Self-pay | Admitting: *Deleted

## 2020-02-01 DIAGNOSIS — R1011 Right upper quadrant pain: Secondary | ICD-10-CM

## 2020-02-03 ENCOUNTER — Telehealth: Payer: Self-pay | Admitting: *Deleted

## 2020-02-03 ENCOUNTER — Other Ambulatory Visit: Payer: Self-pay | Admitting: *Deleted

## 2020-02-03 DIAGNOSIS — G894 Chronic pain syndrome: Secondary | ICD-10-CM | POA: Diagnosis not present

## 2020-02-03 DIAGNOSIS — F17210 Nicotine dependence, cigarettes, uncomplicated: Secondary | ICD-10-CM | POA: Diagnosis not present

## 2020-02-03 DIAGNOSIS — Z79891 Long term (current) use of opiate analgesic: Secondary | ICD-10-CM | POA: Diagnosis not present

## 2020-02-03 DIAGNOSIS — M542 Cervicalgia: Secondary | ICD-10-CM | POA: Diagnosis not present

## 2020-02-03 DIAGNOSIS — M545 Low back pain: Secondary | ICD-10-CM | POA: Diagnosis not present

## 2020-02-03 DIAGNOSIS — J382 Nodules of vocal cords: Secondary | ICD-10-CM | POA: Diagnosis not present

## 2020-02-03 DIAGNOSIS — R490 Dysphonia: Secondary | ICD-10-CM | POA: Diagnosis not present

## 2020-02-03 MED ORDER — SYMBICORT 80-4.5 MCG/ACT IN AERO: 2.0000 | INHALATION_SPRAY | Freq: Two times a day (BID) | RESPIRATORY_TRACT | 11 refills | Status: DC

## 2020-02-03 MED ORDER — SIMVASTATIN 20 MG PO TABS
20.0000 mg | ORAL_TABLET | ORAL | 3 refills | Status: DC
Start: 2020-02-04 — End: 2020-06-13

## 2020-02-03 MED ORDER — SIMVASTATIN 20 MG PO TABS: 20.0000 mg | ORAL_TABLET | ORAL | 3 refills | Status: DC

## 2020-02-03 NOTE — Addendum Note (Signed)
Addended by: Sheral Flow on: 02/03/2020 05:12 PM   Modules accepted: Orders

## 2020-02-03 NOTE — Telephone Encounter (Signed)
Patient is interested in smoking cessation. States that she does not want to use patches or pills.   MD please advise.

## 2020-02-04 NOTE — Telephone Encounter (Signed)
Call placed to patient and patient made aware.   Patient declined Chantix.   Advised to use nicotine gum as needed.

## 2020-02-04 NOTE — Telephone Encounter (Signed)
Okay to send chantix

## 2020-02-08 ENCOUNTER — Other Ambulatory Visit: Payer: Self-pay

## 2020-02-08 ENCOUNTER — Encounter (HOSPITAL_COMMUNITY)
Admission: RE | Admit: 2020-02-08 | Discharge: 2020-02-08 | Disposition: A | Discharge status: Home / Self Care | Payer: Medicare Other | Source: Ambulatory Visit | Attending: Gastroenterology | Admitting: Gastroenterology

## 2020-02-08 DIAGNOSIS — R1011 Right upper quadrant pain: Secondary | ICD-10-CM | POA: Insufficient documentation

## 2020-02-08 DIAGNOSIS — R109 Unspecified abdominal pain: Secondary | ICD-10-CM | POA: Diagnosis not present

## 2020-02-08 MED ORDER — STERILE WATER FOR INJECTION IJ SOLN
INTRAMUSCULAR | Status: AC
  Administered 2020-02-08: 1.05 mL
  Filled 2020-02-08: qty 10

## 2020-02-08 MED ORDER — TECHNETIUM TC 99M MEBROFENIN IV KIT
5.0000 | PACK | Freq: Once | INTRAVENOUS | Status: AC | PRN
  Administered 2020-02-08: 5 via INTRAVENOUS

## 2020-02-08 MED ORDER — SINCALIDE 5 MCG IJ SOLR
INTRAMUSCULAR | Status: AC
  Administered 2020-02-08: 1.05 ug
  Filled 2020-02-08: qty 5

## 2020-02-15 DIAGNOSIS — M4722 Other spondylosis with radiculopathy, cervical region: Secondary | ICD-10-CM | POA: Diagnosis not present

## 2020-03-15 ENCOUNTER — Other Ambulatory Visit: Payer: Self-pay | Admitting: Family Medicine

## 2020-03-17 ENCOUNTER — Emergency Department (HOSPITAL_COMMUNITY)
Admission: EM | Admit: 2020-03-17 | Discharge: 2020-03-17 | Discharge status: Against Medical Advice | Payer: Medicare Other

## 2020-03-17 DIAGNOSIS — R0689 Other abnormalities of breathing: Secondary | ICD-10-CM | POA: Diagnosis not present

## 2020-03-17 DIAGNOSIS — Z743 Need for continuous supervision: Secondary | ICD-10-CM | POA: Diagnosis not present

## 2020-03-17 DIAGNOSIS — R109 Unspecified abdominal pain: Secondary | ICD-10-CM | POA: Diagnosis not present

## 2020-03-17 DIAGNOSIS — R110 Nausea: Secondary | ICD-10-CM | POA: Diagnosis not present

## 2020-03-17 NOTE — ED Triage Notes (Signed)
Pt brought in to triage. Pt with hx of crohn's disease and pain started approx 1/1/2 hrs ago.  Pt given morphine 6 mg and 43m Zofran IV by EMS. Pt requesting to lay down or go to room . No rooms available and explained there would be an extended wait.  Pt refused to stay. States she would rather be home in her bed, Explained we had no problem seeing her but would have to wait. Refused to be triaged and called wayne who was waiting for her. IV removed from her left ac

## 2020-03-27 DIAGNOSIS — Z79899 Other long term (current) drug therapy: Secondary | ICD-10-CM | POA: Diagnosis not present

## 2020-03-27 DIAGNOSIS — M542 Cervicalgia: Secondary | ICD-10-CM | POA: Diagnosis not present

## 2020-03-27 DIAGNOSIS — G894 Chronic pain syndrome: Secondary | ICD-10-CM | POA: Diagnosis not present

## 2020-03-27 DIAGNOSIS — Z79891 Long term (current) use of opiate analgesic: Secondary | ICD-10-CM | POA: Diagnosis not present

## 2020-03-27 DIAGNOSIS — M545 Low back pain, unspecified: Secondary | ICD-10-CM | POA: Diagnosis not present

## 2020-04-24 ENCOUNTER — Other Ambulatory Visit (HOSPITAL_COMMUNITY): Payer: Self-pay | Admitting: Neurology

## 2020-04-24 DIAGNOSIS — M25551 Pain in right hip: Secondary | ICD-10-CM | POA: Diagnosis not present

## 2020-04-24 DIAGNOSIS — G894 Chronic pain syndrome: Secondary | ICD-10-CM | POA: Diagnosis not present

## 2020-04-24 DIAGNOSIS — Z79899 Other long term (current) drug therapy: Secondary | ICD-10-CM | POA: Diagnosis not present

## 2020-04-24 DIAGNOSIS — Z79891 Long term (current) use of opiate analgesic: Secondary | ICD-10-CM | POA: Diagnosis not present

## 2020-04-24 DIAGNOSIS — M542 Cervicalgia: Secondary | ICD-10-CM | POA: Diagnosis not present

## 2020-04-24 DIAGNOSIS — M545 Low back pain, unspecified: Secondary | ICD-10-CM | POA: Diagnosis not present

## 2020-04-26 ENCOUNTER — Other Ambulatory Visit: Payer: Self-pay

## 2020-04-26 ENCOUNTER — Ambulatory Visit (HOSPITAL_COMMUNITY)
Admission: RE | Admit: 2020-04-26 | Discharge: 2020-04-26 | Disposition: A | Discharge status: Home / Self Care | Payer: Medicare Other | Source: Ambulatory Visit | Attending: Neurology | Admitting: Neurology

## 2020-04-26 DIAGNOSIS — M25551 Pain in right hip: Secondary | ICD-10-CM | POA: Insufficient documentation

## 2020-05-09 DIAGNOSIS — M5416 Radiculopathy, lumbar region: Secondary | ICD-10-CM | POA: Diagnosis not present

## 2020-05-09 DIAGNOSIS — M4316 Spondylolisthesis, lumbar region: Secondary | ICD-10-CM | POA: Diagnosis not present

## 2020-05-23 ENCOUNTER — Other Ambulatory Visit: Payer: Self-pay | Admitting: Family Medicine

## 2020-05-24 DIAGNOSIS — M542 Cervicalgia: Secondary | ICD-10-CM | POA: Diagnosis not present

## 2020-05-24 DIAGNOSIS — Z79891 Long term (current) use of opiate analgesic: Secondary | ICD-10-CM | POA: Diagnosis not present

## 2020-05-24 DIAGNOSIS — M545 Low back pain, unspecified: Secondary | ICD-10-CM | POA: Diagnosis not present

## 2020-05-24 DIAGNOSIS — R109 Unspecified abdominal pain: Secondary | ICD-10-CM | POA: Diagnosis not present

## 2020-05-24 DIAGNOSIS — Z79899 Other long term (current) drug therapy: Secondary | ICD-10-CM | POA: Diagnosis not present

## 2020-05-24 DIAGNOSIS — M25551 Pain in right hip: Secondary | ICD-10-CM | POA: Diagnosis not present

## 2020-05-24 DIAGNOSIS — G894 Chronic pain syndrome: Secondary | ICD-10-CM | POA: Diagnosis not present

## 2020-05-31 ENCOUNTER — Other Ambulatory Visit: Payer: Self-pay | Admitting: Family Medicine

## 2020-06-12 NOTE — Progress Notes (Signed)
Referring Provider: Alycia Rossetti, MD Primary Care Physician:  Alycia Rossetti, MD Primary GI: Dr. Abbey Chatters  Chief Complaint  Patient presents with  . Abdominal Pain    Right side; wants labs    HPI:   [name] [name] is a 65 y.o. female presenting today with a history of Crohn's disease s/p right hemicolectomy and ileal resection in 2006 in surgical remission, chronic GERD, dysphagia s/p dilation in July 2020, elevated LFTs in the past, next colonoscopy due between 2024 and 2029.   RUQ pain at last appt. Gallbladder in situ. RUQ US showed 4 mm gallbladder polyp that was stable, fatty liver. HIDA negative. Labs with AST mildly elevated at 47, otherwise all normal. Lipid panel also done in Sept 2021 with total cholesterol elevated at 271. Started on statin but states this caused increased urination and nausea. Doesn't want surgery.   Was sitting on a rock and slipped. Had xray of right side and was "fine". States when doing something physical, she has pain in right hip. Sometimes radiates into her crotch. Took a course of steroids with improvement. Wants to try tumeric. RUQ intermittent on its own. Depends on her activities. Wants additional labs. Ached all day long. Hasn't been taking Dexilant for a long time. Needs refill. Once a week has a glass of wine, which makes her hungry. If gets bubbly in her stomach, will drink Sprite to burp. If this doesn't help, will drink a beer.    Past Medical History:  Diagnosis Date  . Allergic rhinitis   . Anastomotic ulcer JAN 2009  . Anxiety   . Arthritis   . Asthma   . BMI (body mass index) 20.0-29.9 2009 121 lbs  . Concussion   . 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 Crohn disease ([place])   . CTS (carpal tunnel syndrome)   . Diarrhea MULITIFACTORIAL   IBS, LACTOSE INTOLERANCE, SBBO, BILE-SALT  . Elevated liver enzymes 2009: BMI 24 ?Etoh ALT 94, AST 51,  NEG ANA, qIGs& ASMA   MAY 2012 AST 52 ALT 38  . Esophageal stricture 2009   . GERD (gastroesophageal reflux disease)   . Hemorrhoid   . Hyperlipemia   . Hypertension   . Inflammatory bowel disease 2006 CD   SURGICAL REMISSION  . LBP (low back pain)   . Mental disorder   . Migraine   . Osteoporosis   . PONV (postoperative nausea and vomiting)   . Shortness of breath    exertion and humidity  . Shoulder pain    right  . Sleep apnea    Stop [name]    Past Surgical History:  Procedure Laterality Date  . BACK SURGERY    . BACK SURGERY  07/16/2018  . BILATERAL SALPINGOOPHORECTOMY    . BIOPSY  12/24/2011   Procedure: BIOPSY;  Surgeon: Danie Binder, MD;  Location: [facility];  Service: Endoscopy;;  Gastric Biopsies  . BIOPSY N/A 06/30/2012   Procedure: BIOPSY;  Surgeon: Danie Binder, MD;  Location: [facility];  Service: Endoscopy;  Laterality: N/A;  Gastric and Esophageal Biopsies  . CARPAL TUNNEL RELEASE  LEFT  . CATARACT EXTRACTION W/PHACO Right 07/30/2016   Procedure: CATARACT EXTRACTION PHACO AND INTRAOCULAR LENS PLACEMENT (IOC);  Surgeon: Rutherford Guys, MD;  Location: [facility];  Service: Ophthalmology;  Laterality: Right;  CDE: 5.45  . CATARACT EXTRACTION W/PHACO Left 08/13/2016   Procedure: CATARACT EXTRACTION PHACO AND INTRAOCULAR LENS PLACEMENT (  [place]);  Surgeon: Rutherford Guys, MD;  Location: [facility];  Service: Ophthalmology;  Laterality: Left;  CDE: 5.59  . COLON SURGERY    . COLONOSCOPY  JAN 2009 DIARRHEA, ABD PAIN, BRBPR   ANASTOMOTIC ULCER(3MM), IH GN:FAOZHY ULCER, NL COLON bX  . COLONOSCOPY WITH PROPOFOL N/A 04/21/2018   examined portion of the ileum normal, two 3-5 mm polyps, diverticulosis in the rectosigmoid and sigmoid colon, external and internal hemorrhoids, significant looping of the colon.  Pathology with tubular adenomas.  Recommendations to follow high-fiber diet and repeat colonoscopy in 5 to 10 years with peds colonoscope.  Marland Kitchen DILATION AND CURETTAGE OF UTERUS    . ESOPHAGEAL DILATION  12/24/2011   SLF: A stricture was found in the  distal esophagus/ Moderate gastritis/ MILD Duodenitis  . ESOPHAGOGASTRODUODENOSCOPY  02/07/09   mild gastritis  . ESOPHAGOGASTRODUODENOSCOPY (EGD) WITH PROPOFOL N/A 06/30/2012   SLF: 1. No definite stricture appreciated 2. small hiatal hernia 3. Gastritis  . ESOPHAGOGASTRODUODENOSCOPY (EGD) WITH PROPOFOL N/A 02/20/2016   Procedure: ESOPHAGOGASTRODUODENOSCOPY (EGD) WITH PROPOFOL;  Surgeon: Danie Binder, MD;  [facility];  Service: Endoscopy;  Laterality: N/A;  930  . ESOPHAGOGASTRODUODENOSCOPY (EGD) WITH PROPOFOL N/A 11/24/2018   benign-appearing esophageal peptic stricture s/p dilation, small hiatal hernia, moderate erosive gastritis.   Marland Kitchen EYE SURGERY    . gum flap Bilateral   . HEMICOLECTOMY  RIGHT 2006  . HERNIA REPAIR    . LUMBAR DISC SURGERY    . POLYPECTOMY  04/21/2018   Procedure: POLYPECTOMY;  Surgeon: Danie Binder, MD;  [facility];  Service: Endoscopy;;  (colon)  . SAVORY DILATION N/A 06/30/2012   Procedure: SAVORY DILATION;  Surgeon: Danie Binder, MD;  Location: [facility];  Service: Endoscopy;  Laterality: N/A;  12.73m , 165m 1568m. SAVORY DILATION N/A 02/20/2016   Procedure: SAVORY DILATION;  Surgeon: SanDanie BinderD;  [facility];  Service: Endoscopy;  Laterality: N/A;  . SAVORY DILATION N/A 11/24/2018   Procedure: SAVORY DILATION;  Surgeon: FieDanie BinderD;  [facility];  Service: Endoscopy;  Laterality: N/A;  . UPPER GASTROINTESTINAL ENDOSCOPY  JAN 2009 ABD PAIN   GASTRITIS, ESO RING  . UPPER GASTROINTESTINAL ENDOSCOPY  OCT 2010 ABD PAIN, DYSPHAGIA   DIL 15MM, GASTRITIS, NL DUODENUM  . UPPER GASTROINTESTINAL ENDOSCOPY  OCT 2010 DYSPHAGIA   DIL 17 MM, GASTRITIS, NL DUODENUM  . vocal cord surgery  Sep 20, 2010   precancerous areas removed  . wisdom tooth extraction Right    pin placement due to broken jaw    Current Outpatient Medications  Medication Sig Dispense Refill  . albuterol (VENTOLIN HFA) 108 (90 Base)  MCG/ACT inhaler Inhale 2 puffs into the lungs every 6 (six) hours as needed for wheezing or shortness of breath. 18 g 11  . amLODipine (NORVASC) 5 MG tablet Take 1 tablet by mouth once daily 90 tablet 0  . benzonatate (TESSALON) 200 MG capsule Take 1 capsule by mouth three times daily as needed for cough 30 capsule 0  . calcium carbonate (TUMS - DOSED IN MG ELEMENTAL CALCIUM) 500 MG chewable tablet Chew 1 tablet (200 mg of elemental calcium total) by mouth 3 (three) times daily with meals. 90 tablet 6  . cholestyramine (QUESTRAN) 4 GM/DOSE powder DISSOLVE AND TAKE 2 GRAMS BY MOUTH TWICE DAILY WITH A MEAL. DO NOT TAKE WITHIN 2 HOURS OF OTHER MEDICATIONS. 378 g 0  . clotrimazole (CLOTRIMAZOLE AF) 1 % cream  Apply 1 application topically 2 (two) times daily. To affected area for 1-2 weeks 30 g 0  . cyclobenzaprine (FLEXERIL) 10 MG tablet Take 10 mg by mouth as needed for muscle spasms.     Marland Kitchen denosumab (PROLIA) 60 MG/ML SOSY injection Inject 60 mg into the skin every 6 (six) months.    . dexlansoprazole (DEXILANT) 60 MG capsule 1 PO EVERY MORNING WITH BREAKFAST. (Patient taking differently: Take 60 mg by mouth daily with breakfast. ) 30 capsule 11  . diazepam (VALIUM) 5 MG tablet Take 5 mg by mouth every 12 (twelve) hours as needed for anxiety.    Marland Kitchen estradiol (ESTRACE VAGINAL) 0.1 MG/GM vaginal cream Place 7.12 Applicatorfuls vaginally 3 (three) times a week. This 0.5 g/ application 45.8 g 3  . fluticasone (FLONASE) 50 MCG/ACT nasal spray USE TWO SPRAY(S) IN EACH NOSTRIL ONCE DAILY AS NEEDED FOR ALLERGY OR RHINITIS (Patient taking differently: Place 2 sprays into both nostrils daily as needed for allergies or rhinitis. ) 16 g 2  . gabapentin (NEURONTIN) 400 MG capsule Take 1 capsule (400 mg total) by mouth 4 (four) times daily.    . hydrocortisone 2.5 % cream Apply topically 2 (two) times daily. To affected areas 30 g 0  . hyoscyamine (LEVSIN SL) 0.125 MG SL tablet Place 1 tablet (0.125 mg total) under the  tongue every 6 (six) hours as needed. For abdominal cramping 30 tablet 0  . lactase (LACTAID) 3000 units tablet Take 3,000 Units by mouth daily as needed (lactose intolerance).    . Lidocaine HCl (ASPERCREME LIDOCAINE) 4 % LIQD Apply 1 application topically 3 (three) times daily as needed (pain).    Marland Kitchen loratadine (CLARITIN) 10 MG tablet Take 1 tablet (10 mg total) by mouth daily. (Patient taking differently: Take 10 mg by mouth daily as needed for allergies. ) 30 tablet 3  . magnesium hydroxide (MILK OF MAGNESIA) 400 MG/5ML suspension Take 30 mLs by mouth daily as needed for mild constipation.    . Menthol (BIOFREEZE) 10 % AERO Apply 1 application topically 3 (three) times daily as needed (pain).    . nystatin (MYCOSTATIN) 100000 UNIT/ML suspension TAKE 5 ML BY MOUTH  4 TIMES DAILY AS NEEDED FOR THRUSH 500 mL 0  . oxyCODONE-acetaminophen (PERCOCET) 10-325 MG tablet Take 1 tablet by mouth every 6 (six) hours as needed for pain.     . simvastatin (ZOCOR) 20 MG tablet Take 1 tablet (20 mg total) by mouth 3 (three) times a week. 30 tablet 3  . SYMBICORT 80-4.5 MCG/ACT inhaler Inhale 2 puffs into the lungs 2 (two) times daily. 11 g 11  . UNABLE TO FIND Capsule to rebuild cells-BID    . UNABLE TO FIND as needed. Penetrex cream    . valACYclovir (VALTREX) 1000 MG tablet Take 1 tablet (1,000 mg total) by mouth 2 (two) times daily. 14 tablet 0   No current facility-administered medications for this visit.    Allergies as of 06/13/2020 - Review Complete 06/13/2020  Allergen Reaction Noted  . Naproxen Shortness Of Breath 01/09/2016  . Eggs or egg-derived products Diarrhea 02/20/2018  . Lovastatin Other (See Comments) 09/25/2010  . Toradol [ketorolac tromethamine] Other (See Comments) 02/20/2016  . Tramadol Other (See Comments) 02/20/2016  . Nexium [esomeprazole magnesium] Diarrhea 06/12/2012    Family History  Problem Relation Age of Onset  . Hypothyroidism Mother   . Heart disease Father   .  Parkinson's disease Father   . Hypothyroidism Daughter   . Heart attack  Paternal Grandfather   . Alzheimer's disease Paternal Grandmother   . Heart attack Maternal Grandfather   . Crohn's disease Sister   . Other Sister        was murdered  . Crohn's disease Maternal Uncle   . Colon cancer Neg Hx   . Colon polyps Neg Hx     Social History   Socioeconomic History  . Marital status: Single    Spouse name: [place]  . Number of children: 2  . Years of education: GED  . Highest education level: Not on file  Occupational History    Comment: disabled  Tobacco Use  . Smoking status: Current Every Day Smoker    Packs/day: 0.25    Years: 30.00    Pack years: 7.50    Types: Cigarettes  . Smokeless tobacco: Never Used  Vaping Use  . Vaping Use: Never used  Substance and Sexual Activity  . Alcohol use: Yes    Comment: wine/beer a few times a week   . Drug use: No  . Sexual activity: Not Currently    Birth control/protection: Post-menopausal  Other Topics Concern  . Not on file  Social History Narrative   Lives with husband   no caffiene   Social Determinants of Health   Financial Resource Strain: Not on file  Food Insecurity: Not on file  Transportation Needs: Not on file  Physical Activity: Not on file  Stress: Not on file  Social Connections: Not on file    Review of Systems: Gen: Denies fever, chills, anorexia. Denies fatigue, weakness, weight loss.  CV: Denies chest pain, palpitations, syncope, peripheral edema, and claudication. Resp: Denies dyspnea at rest, cough, wheezing, coughing up blood, and pleurisy. GI: see HPI Derm: Denies rash, itching, dry skin Psych: Denies depression, anxiety, memory loss, confusion. No homicidal or suicidal ideation.  Heme: Denies bruising, bleeding, and enlarged lymph nodes.  Physical Exam: BP 116/80   Pulse 74   Temp (!) 97.1 F (36.2 C) (Temporal)   Ht 5' (1.524 m)   Wt 117 lb 6.4 oz (53.3 kg)   BMI 22.93 kg/m  General:    Alert and oriented. No distress noted. Pleasant and cooperative.  Head:  Normocephalic and atraumatic. Eyes:  Conjuctiva clear without scleral icterus. Mouth:  Mask in place Abdomen:  +BS, soft, non-tender and non-distended. No rebound or guarding. No HSM or masses noted. Msk:  Symmetrical without gross deformities. Normal posture. Extremities:  Without edema. Neurologic:  Alert and  oriented x4 Psych:  Alert and cooperative. Normal mood and affect.  ASSESSMENT: [name] [name] is a 65 y.o. female presenting today with a history of Crohn's disease s/p right hemicolectomy and ileal resection in 2006 with surgical remission, chronic GERD, chronic RUQ pain, dysphagia s/p dilation in July 2020, elevated LFTs in the past, known small gallbladder polyp, returning today in routine follow-up.  Biggest concern today is right lower back pain s/p fall several months ago. She has been seen by Neurology and ortho. RUQ pain is vague, and we discussed that she has no gallstones and HIDA was negative. With gallbladder polyp, could refer for elective cholecystectomy, but she declines any surgery. Will need to follow this annually with ultrasound. I do note she has been off of a PPI for unknown period of time, which likely is contributing. She continues to drink intermittently as well, and we discussed avoidance of alcohol although seems to downplay how much she drinks.   Elevated transaminases: mildly elevated AST in setting  of ETOH use, fatty liver. Update labs today.   PLAN:  CBC and HFP today RUQ [place] in Sept 2022 for gallbladder polyp surveillance Resume Dexilant daily Avoid ETOH Return in 6 months   Annitta Needs, PhD, [facility]

## 2020-06-13 ENCOUNTER — Telehealth: Payer: Self-pay | Admitting: Gastroenterology

## 2020-06-13 ENCOUNTER — Ambulatory Visit (INDEPENDENT_AMBULATORY_CARE_PROVIDER_SITE_OTHER): Payer: Medicare Other | Admitting: Gastroenterology

## 2020-06-13 ENCOUNTER — Encounter: Payer: Self-pay | Admitting: Gastroenterology

## 2020-06-13 ENCOUNTER — Other Ambulatory Visit: Payer: Self-pay

## 2020-06-13 VITALS — BP 116/80 | HR 74 | Temp 97.1°F | Ht 60.0 in | Wt 117.4 lb

## 2020-06-13 DIAGNOSIS — R1011 Right upper quadrant pain: Secondary | ICD-10-CM

## 2020-06-13 MED ORDER — DEXLANSOPRAZOLE 60 MG PO CPDR
DELAYED_RELEASE_CAPSULE | ORAL | 3 refills | Status: DC
Start: 2020-06-13 — End: 2023-04-14

## 2020-06-13 NOTE — Telephone Encounter (Signed)
[name] [name], can we place her on recall for [place] in Sept 2022 due to gallbladder polyp?

## 2020-06-13 NOTE — Telephone Encounter (Signed)
Done

## 2020-06-13 NOTE — Patient Instructions (Signed)
I sent in [place] to take once daily.  I have also ordered labs for you. The lipid panel will have to be with Dr. Buelah Manis.   I recommend avoiding alcohol completely.   We will see you in 6 months!   I enjoyed seeing you again today! As you know, I value our relationship and want to provide genuine, compassionate, and quality care. I welcome your feedback. If you receive a survey regarding your visit,  I greatly appreciate you taking time to fill this out. See you next time!  Annitta Needs, PhD, ANP-BC [facility]

## 2020-06-22 DIAGNOSIS — R109 Unspecified abdominal pain: Secondary | ICD-10-CM | POA: Diagnosis not present

## 2020-06-22 DIAGNOSIS — M542 Cervicalgia: Secondary | ICD-10-CM | POA: Diagnosis not present

## 2020-06-22 DIAGNOSIS — M545 Low back pain, unspecified: Secondary | ICD-10-CM | POA: Diagnosis not present

## 2020-06-22 DIAGNOSIS — G894 Chronic pain syndrome: Secondary | ICD-10-CM | POA: Diagnosis not present

## 2020-06-22 DIAGNOSIS — Z79899 Other long term (current) drug therapy: Secondary | ICD-10-CM | POA: Diagnosis not present

## 2020-06-22 DIAGNOSIS — M25551 Pain in right hip: Secondary | ICD-10-CM | POA: Diagnosis not present

## 2020-06-22 DIAGNOSIS — Z79891 Long term (current) use of opiate analgesic: Secondary | ICD-10-CM | POA: Diagnosis not present

## 2020-06-27 ENCOUNTER — Other Ambulatory Visit: Payer: Self-pay

## 2020-06-27 ENCOUNTER — Encounter: Payer: Self-pay | Admitting: Family Medicine

## 2020-06-27 ENCOUNTER — Ambulatory Visit (INDEPENDENT_AMBULATORY_CARE_PROVIDER_SITE_OTHER): Payer: Medicare Other | Admitting: Family Medicine

## 2020-06-27 VITALS — BP 130/84 | HR 108 | Temp 98.1°F | Ht 60.0 in | Wt 117.0 lb

## 2020-06-27 DIAGNOSIS — J441 Chronic obstructive pulmonary disease with (acute) exacerbation: Secondary | ICD-10-CM

## 2020-06-27 MED ORDER — PREDNISONE 20 MG PO TABS: ORAL_TABLET | ORAL | 0 refills | Status: DC

## 2020-06-27 MED ORDER — AZITHROMYCIN 250 MG PO TABS: ORAL_TABLET | ORAL | 0 refills | Status: DC

## 2020-06-27 NOTE — Progress Notes (Signed)
Subjective:    Patient ID: [name] [name], female    DOB: Jul 24, 1955, 65 y.o.   MRN: [id number]  HPI  Patient is a 65 year old Caucasian female who has a history of what sounds like COPD for which she takes Symbicort.  She also smokes.  She recently discovered mold in her home.  She brings in pictures today of the mold spores that they have found.  She states that over the last week her cough has gotten much worse.  She reports shortness of breath and wheezing.  She reports a cough productive of green sputum.  Therefore she has noticed an increase in sputum frequency, volume, and consistency.  She denies any chest pain however her pulse oximetry is slightly low at 93%.  She denies any fevers or chills.  She does report pain and pressure in her sinuses as well with copious rhinorrhea Past Medical History:  Diagnosis Date  . Allergic rhinitis   . Anastomotic ulcer JAN 2009  . Anxiety   . Arthritis   . Asthma   . BMI (body mass index) 20.0-29.9 2009 121 lbs  . Concussion   . 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 Crohn disease ([place])   . CTS (carpal tunnel syndrome)   . Diarrhea MULITIFACTORIAL   IBS, LACTOSE INTOLERANCE, SBBO, BILE-SALT  . Elevated liver enzymes 2009: BMI 24 ?Etoh ALT 94, AST 51,  NEG ANA, qIGs& ASMA   MAY 2012 AST 52 ALT 38  . Esophageal stricture 2009  . GERD (gastroesophageal reflux disease)   . Hemorrhoid   . Hyperlipemia   . Hypertension   . Inflammatory bowel disease 2006 CD   SURGICAL REMISSION  . LBP (low back pain)   . Mental disorder   . Migraine   . Osteoporosis   . PONV (postoperative nausea and vomiting)   . Shortness of breath    exertion and humidity  . Shoulder pain    right  . Sleep apnea    Stop [name]   Past Surgical History:  Procedure Laterality Date  . BACK SURGERY    . BACK SURGERY  07/16/2018  . BILATERAL SALPINGOOPHORECTOMY    . BIOPSY  12/24/2011   Procedure: BIOPSY;  Surgeon: Danie Binder, MD;  Location: [facility];  Service: Endoscopy;;  Gastric Biopsies  . BIOPSY N/A 06/30/2012   Procedure: BIOPSY;  Surgeon: Danie Binder, MD;  Location: [facility];  Service: Endoscopy;  Laterality: N/A;  Gastric and Esophageal Biopsies  . CARPAL TUNNEL RELEASE  LEFT  . CATARACT EXTRACTION W/PHACO Right 07/30/2016   Procedure: CATARACT EXTRACTION PHACO AND INTRAOCULAR LENS PLACEMENT (IOC);  Surgeon: Rutherford Guys, MD;  Location: [facility];  Service: Ophthalmology;  Laterality: Right;  CDE: 5.45  . CATARACT EXTRACTION W/PHACO Left 08/13/2016   Procedure: CATARACT EXTRACTION PHACO AND INTRAOCULAR LENS PLACEMENT (IOC);  Surgeon: Rutherford Guys, MD;  Location: [facility];  Service: Ophthalmology;  Laterality: Left;  CDE: 5.59  . COLON SURGERY    . COLONOSCOPY  JAN 2009 DIARRHEA, ABD PAIN, BRBPR   ANASTOMOTIC ULCER(3MM), IH ST:MHDQQI ULCER, NL COLON bX  . COLONOSCOPY WITH PROPOFOL N/A 04/21/2018   examined portion of the ileum normal, two 3-5 mm polyps, diverticulosis in the rectosigmoid and sigmoid colon, external and internal hemorrhoids, significant looping of the colon.  Pathology with tubular adenomas.  Recommendations to follow high-fiber diet and repeat colonoscopy in 5 to 10 years with peds colonoscope.  Marland Kitchen DILATION AND CURETTAGE OF  UTERUS    . ESOPHAGEAL DILATION  12/24/2011   SLF: A stricture was found in the distal esophagus/ Moderate gastritis/ MILD Duodenitis  . ESOPHAGOGASTRODUODENOSCOPY  02/07/09   mild gastritis  . ESOPHAGOGASTRODUODENOSCOPY (EGD) WITH PROPOFOL N/A 06/30/2012   SLF: 1. No definite stricture appreciated 2. small hiatal hernia 3. Gastritis  . ESOPHAGOGASTRODUODENOSCOPY (EGD) WITH PROPOFOL N/A 02/20/2016   Procedure: ESOPHAGOGASTRODUODENOSCOPY (EGD) WITH PROPOFOL;  Surgeon: Danie Binder, MD;  [facility];  Service: Endoscopy;  Laterality: N/A;  930  . ESOPHAGOGASTRODUODENOSCOPY (EGD) WITH PROPOFOL N/A 11/24/2018   benign-appearing esophageal peptic stricture s/p dilation, small hiatal hernia,  moderate erosive gastritis.   Marland Kitchen EYE SURGERY    . gum flap Bilateral   . HEMICOLECTOMY  RIGHT 2006  . HERNIA REPAIR    . LUMBAR DISC SURGERY    . POLYPECTOMY  04/21/2018   Procedure: POLYPECTOMY;  Surgeon: Danie Binder, MD;  [facility];  Service: Endoscopy;;  (colon)  . SAVORY DILATION N/A 06/30/2012   Procedure: SAVORY DILATION;  Surgeon: Danie Binder, MD;  Location: [facility];  Service: Endoscopy;  Laterality: N/A;  12.51m , 112m 1548m. SAVORY DILATION N/A 02/20/2016   Procedure: SAVORY DILATION;  Surgeon: SanDanie BinderD;  [facility];  Service: Endoscopy;  Laterality: N/A;  . SAVORY DILATION N/A 11/24/2018   Procedure: SAVORY DILATION;  Surgeon: FieDanie BinderD;  [facility];  Service: Endoscopy;  Laterality: N/A;  . UPPER GASTROINTESTINAL ENDOSCOPY  JAN 2009 ABD PAIN   GASTRITIS, ESO RING  . UPPER GASTROINTESTINAL ENDOSCOPY  OCT 2010 ABD PAIN, DYSPHAGIA   DIL 15MM, GASTRITIS, NL DUODENUM  . UPPER GASTROINTESTINAL ENDOSCOPY  OCT 2010 DYSPHAGIA   DIL 17 MM, GASTRITIS, NL DUODENUM  . vocal cord surgery  Sep 20, 2010   precancerous areas removed  . wisdom tooth extraction Right    pin placement due to broken jaw   Current Outpatient Medications on File Prior to Visit  Medication Sig Dispense Refill  . albuterol (VENTOLIN HFA) 108 (90 Base) MCG/ACT inhaler Inhale 2 puffs into the lungs every 6 (six) hours as needed for wheezing or shortness of breath. 18 g 11  . amLODipine (NORVASC) 5 MG tablet Take 1 tablet by mouth once daily 90 tablet 0  . benzonatate (TESSALON) 200 MG capsule Take 1 capsule by mouth three times daily as needed for cough 30 capsule 0  . calcium carbonate (TUMS - DOSED IN MG ELEMENTAL CALCIUM) 500 MG chewable tablet Chew 1 tablet (200 mg of elemental calcium total) by mouth 3 (three) times daily with meals. 90 tablet 6  . cholestyramine (QUESTRAN) 4 GM/DOSE powder DISSOLVE AND TAKE 2 GRAMS BY MOUTH TWICE DAILY WITH A MEAL.  DO NOT TAKE WITHIN 2 HOURS OF OTHER MEDICATIONS. 378 g 0  . clotrimazole (CLOTRIMAZOLE AF) 1 % cream Apply 1 application topically 2 (two) times daily. To affected area for 1-2 weeks 30 g 0  . cyclobenzaprine (FLEXERIL) 10 MG tablet Take 10 mg by mouth as needed for muscle spasms.     . dMarland Kitchenxlansoprazole (DEXILANT) 60 MG capsule 1 PO EVERY MORNING WITH BREAKFAST. 90 capsule 3  . diazepam (VALIUM) 5 MG tablet Take 5 mg by mouth every 12 (twelve) hours as needed for anxiety.    . fluticasone (FLONASE) 50 MCG/ACT nasal spray USE TWO SPRAY(S) IN EACH NOSTRIL ONCE DAILY AS NEEDED FOR ALLERGY OR RHINITIS (Patient taking differently: Place 2 sprays into  both nostrils daily as needed for allergies or rhinitis.) 16 g 2  . gabapentin (NEURONTIN) 400 MG capsule Take 1 capsule (400 mg total) by mouth 4 (four) times daily.    . hyoscyamine (LEVSIN SL) 0.125 MG SL tablet Place 1 tablet (0.125 mg total) under the tongue every 6 (six) hours as needed. For abdominal cramping 30 tablet 0  . lactase (LACTAID) 3000 units tablet Take 3,000 Units by mouth daily as needed (lactose intolerance).    . Lidocaine HCl 4 % LIQD Apply 1 application topically 3 (three) times daily as needed (pain).    Marland Kitchen loratadine (CLARITIN) 10 MG tablet Take 1 tablet (10 mg total) by mouth daily. (Patient taking differently: Take 10 mg by mouth daily as needed for allergies.) 30 tablet 3  . magnesium hydroxide (MILK OF MAGNESIA) 400 MG/5ML suspension Take 30 mLs by mouth daily as needed for mild constipation.    . Menthol 10 % AERO Apply 1 application topically 3 (three) times daily as needed (pain).    . nystatin (MYCOSTATIN) 100000 UNIT/ML suspension TAKE 5 ML BY MOUTH  4 TIMES DAILY AS NEEDED FOR THRUSH 500 mL 0  . oxyCODONE-acetaminophen (PERCOCET) 10-325 MG tablet Take 1 tablet by mouth every 6 (six) hours as needed for pain.     . SYMBICORT 80-4.5 MCG/ACT inhaler Inhale 2 puffs into the lungs 2 (two) times daily. 11 g 11  . UNABLE TO FIND  Capsule to rebuild cells-BID    . UNABLE TO FIND as needed. Penetrex cream    . valACYclovir (VALTREX) 1000 MG tablet Take 1 tablet (1,000 mg total) by mouth 2 (two) times daily. (Patient taking differently: Take 1,000 mg by mouth as needed.) 14 tablet 0   No current facility-administered medications on file prior to visit.   Allergies  Allergen Reactions  . Naproxen Shortness Of Breath    Chest pain  . Eggs Or Egg-Derived Products Diarrhea    abd pain  . Lovastatin Other (See Comments)    Chest pain  . Toradol [Ketorolac Tromethamine] Other (See Comments)    Headache. Non-effective  . Tramadol Other (See Comments)    Headache. Non-effective.  Marland Kitchen Nexium [Esomeprazole Magnesium] Diarrhea    Abdominal pain   Social History   Socioeconomic History  . Marital status: Single    Spouse name: [place]  . Number of children: 2  . Years of education: GED  . Highest education level: Not on file  Occupational History    Comment: disabled  Tobacco Use  . Smoking status: Current Every Day Smoker    Packs/day: 0.25    Years: 30.00    Pack years: 7.50    Types: Cigarettes  . Smokeless tobacco: Never Used  Vaping Use  . Vaping Use: Never used  Substance and Sexual Activity  . Alcohol use: Yes    Comment: wine/beer a few times a week   . Drug use: No  . Sexual activity: Not Currently    Birth control/protection: Post-menopausal  Other Topics Concern  . Not on file  Social History Narrative   Lives with husband   no caffiene   Social Determinants of Health   Financial Resource Strain: Not on file  Food Insecurity: Not on file  Transportation Needs: Not on file  Physical Activity: Not on file  Stress: Not on file  Social Connections: Not on file  Intimate Partner Violence: Not on file    Review of Systems  All other systems reviewed and are negative.  Objective:   Physical Exam Constitutional:      General: She is not in acute distress.    Appearance: Normal  appearance. She is not ill-appearing, toxic-appearing or diaphoretic.  HENT:     Nose: Congestion and rhinorrhea present.  Cardiovascular:     Rate and Rhythm: Normal rate and regular rhythm.     Heart sounds: Normal heart sounds.  Pulmonary:     Effort: No respiratory distress.     Breath sounds: Decreased breath sounds and wheezing present. No rhonchi or rales.  Lymphadenopathy:     Cervical: No cervical adenopathy.  Neurological:     Mental Status: She is alert.           Assessment & Plan:  COPD exacerbation ([place])  Patient states that she use Ventolin prior to arrival.  Despite that, she does have diminished breath sounds bilaterally and a few expiratory wheezes.  She also meets criteria for COPD exacerbation due to the increase sputum production, the change in sputum characteristics as well.  She also appears to have a sinus infection for those reasons I will put her on a prednisone taper pack, a Z-Pak, and recommend using her albuterol 2 puffs every 4-6 hours as needed.  Recheck if no better in 1 week or sooner if worse

## 2020-07-19 ENCOUNTER — Encounter: Payer: Self-pay | Admitting: Internal Medicine

## 2020-07-19 ENCOUNTER — Other Ambulatory Visit: Payer: Self-pay

## 2020-07-19 ENCOUNTER — Ambulatory Visit (INDEPENDENT_AMBULATORY_CARE_PROVIDER_SITE_OTHER): Payer: Medicare Other | Admitting: Internal Medicine

## 2020-07-19 VITALS — BP 123/71 | HR 89 | Temp 99.1°F | Resp 18 | Ht 60.0 in | Wt 114.4 lb

## 2020-07-19 DIAGNOSIS — G894 Chronic pain syndrome: Secondary | ICD-10-CM | POA: Diagnosis not present

## 2020-07-19 DIAGNOSIS — F17210 Nicotine dependence, cigarettes, uncomplicated: Secondary | ICD-10-CM

## 2020-07-19 DIAGNOSIS — Z1231 Encounter for screening mammogram for malignant neoplasm of breast: Secondary | ICD-10-CM

## 2020-07-19 DIAGNOSIS — Z720 Tobacco use: Secondary | ICD-10-CM | POA: Diagnosis not present

## 2020-07-19 DIAGNOSIS — J410 Simple chronic bronchitis: Secondary | ICD-10-CM

## 2020-07-19 DIAGNOSIS — Z7689 Persons encountering health services in other specified circumstances: Secondary | ICD-10-CM | POA: Diagnosis not present

## 2020-07-19 DIAGNOSIS — I10 Essential (primary) hypertension: Secondary | ICD-10-CM

## 2020-07-19 DIAGNOSIS — K219 Gastro-esophageal reflux disease without esophagitis: Secondary | ICD-10-CM

## 2020-07-19 DIAGNOSIS — M4316 Spondylolisthesis, lumbar region: Secondary | ICD-10-CM

## 2020-07-19 DIAGNOSIS — L0390 Cellulitis, unspecified: Secondary | ICD-10-CM

## 2020-07-19 DIAGNOSIS — F411 Generalized anxiety disorder: Secondary | ICD-10-CM

## 2020-07-19 DIAGNOSIS — R52 Pain, unspecified: Secondary | ICD-10-CM

## 2020-07-19 DIAGNOSIS — K50019 Crohn's disease of small intestine with unspecified complications: Secondary | ICD-10-CM

## 2020-07-19 MED ORDER — DOXYCYCLINE HYCLATE 100 MG PO TABS: 100.0000 mg | ORAL_TABLET | Freq: Two times a day (BID) | ORAL | 0 refills | Status: DC

## 2020-07-19 NOTE — Patient Instructions (Signed)
Please start taking Doxycycline as prescribed.  Keep the area clean and dry.  Continue taking other medications.  Continue to follow up with your GI and Neurologist.

## 2020-07-19 NOTE — Assessment & Plan Note (Signed)
Smokes about 3 to 4 cigarettes/day  Asked about quitting: confirms that she currently smokes cigarettes Advise to quit smoking: Educated about QUITTING to reduce the risk of cancer, cardio and cerebrovascular disease. Assess willingness: Unwilling to quit at this time, but is working on cutting back. Assist with counseling and pharmacotherapy: Counseled for 5 minutes and literature provided. Arrange for follow up: Follow up in 3 months and continue to offer help.

## 2020-07-19 NOTE — Assessment & Plan Note (Signed)
BP Readings from Last 1 Encounters:  07/19/20 123/71   Well-controlled with Amlodipine 5 mg QD Counseled for compliance with the medications Advised DASH diet and moderate exercise/walking, at least 150 mins/week

## 2020-07-19 NOTE — Assessment & Plan Note (Signed)
Chronic low back pain S/p spinal fusion surgery On Gabapentin and Percocet, f/u pain clinic

## 2020-07-19 NOTE — Assessment & Plan Note (Signed)
Takes Dexilant F/u with GI

## 2020-07-19 NOTE — Assessment & Plan Note (Signed)
Continues to smoke about 3-4 cigarettes/day Albuterol PRN

## 2020-07-19 NOTE — Assessment & Plan Note (Signed)
S/p partial bowel resection F/u with GI Has tried multiple medications in the past On chronic pain medications

## 2020-07-19 NOTE — Assessment & Plan Note (Signed)
On Valium 5 mg q12h PRN Receives it from pain clinic

## 2020-07-19 NOTE — Progress Notes (Signed)
New Patient Office Visit  Subjective:  Patient ID: [name] [name], female    DOB: 1956-03-07  Age: 65 y.o. MRN: [id number]  CC:  Chief Complaint  Patient presents with  . New Patient (Initial Visit)    New patient former dr Buelah Manis pt has not been feeling good had mold issue in house was given antibiotic and prednisone however she is still not feeling great     HPI [name] [name] is 65 year old female with past medical history of hypertension, Crohn's disease status post partial bowel resection, GERD, DJD of cervical and lumbar spine, s/p lumbar fusion surgery, chronic pain syndrome and tobacco abuse who presents for establishing care.  She is a former patient of Dr. Buelah Manis.  BP is well-controlled. Takes medications regularly. Patient denies headache, dizziness, chest pain, dyspnea or palpitations.  She has history of Crohn's disease, but she is not on immunosuppressives.  She has tried different regimens in the past.  She has had a partial bowel resection.  She follows up with [facility] gastroenterology.  She has a history of chronic pain syndrome, for which she follows up with Dr. Merlene Laughter.  She is on gabapentin and Percocet as needed.  She reports that she had mold issues at her home, and was having bronchitis due to it.  She was given steroids for short term.  She also reports a rash over her left hand, which has been red and swollen for last 2 weeks.  She denies any known insect bite, but [place].    She has not had COVID vaccines yet.  Past Medical History:  Diagnosis Date  . Allergic rhinitis   . Anastomotic ulcer JAN 2009  . Anxiety   . Arthritis   . Asthma   . BMI (body mass index) 20.0-29.9 2009 121 lbs  . Concussion   . 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 Crohn disease ([place])   . CTS (carpal tunnel syndrome)   . Diarrhea MULITIFACTORIAL   IBS, LACTOSE INTOLERANCE, SBBO, BILE-SALT  . Elevated liver enzymes 2009: BMI 24 ?Etoh ALT 94,  AST 51,  NEG ANA, qIGs& ASMA   MAY 2012 AST 52 ALT 38  . Esophageal stricture 2009  . GERD (gastroesophageal reflux disease)   . Hemorrhoid   . Hyperlipemia   . Hypertension   . Inflammatory bowel disease 2006 CD   SURGICAL REMISSION  . LBP (low back pain)   . Mental disorder   . Migraine   . Osteoporosis   . PONV (postoperative nausea and vomiting)   . Shortness of breath    exertion and humidity  . Shoulder pain    right  . Sleep apnea    Stop [name]    Past Surgical History:  Procedure Laterality Date  . BACK SURGERY    . BACK SURGERY  07/16/2018  . BILATERAL SALPINGOOPHORECTOMY    . BIOPSY  12/24/2011   Procedure: BIOPSY;  Surgeon: Danie Binder, MD;  Location: [facility];  Service: Endoscopy;;  Gastric Biopsies  . BIOPSY N/A 06/30/2012   Procedure: BIOPSY;  Surgeon: Danie Binder, MD;  Location: [facility];  Service: Endoscopy;  Laterality: N/A;  Gastric and Esophageal Biopsies  . CARPAL TUNNEL RELEASE  LEFT  . CATARACT EXTRACTION W/PHACO Right 07/30/2016   Procedure: CATARACT EXTRACTION PHACO AND INTRAOCULAR LENS PLACEMENT (IOC);  Surgeon: Rutherford Guys, MD;  Location: [facility];  Service: Ophthalmology;  Laterality: Right;  CDE: 5.45  . CATARACT EXTRACTION W/PHACO Left 08/13/2016   Procedure: CATARACT EXTRACTION PHACO AND INTRAOCULAR LENS PLACEMENT (IOC);  Surgeon: Rutherford Guys, MD;  Location: [facility];  Service: Ophthalmology;  Laterality: Left;  CDE: 5.59  . COLON SURGERY    . COLONOSCOPY  JAN 2009 DIARRHEA, ABD PAIN, BRBPR   ANASTOMOTIC ULCER(3MM), IH FV:CBSWHQ ULCER, NL COLON bX  . COLONOSCOPY WITH PROPOFOL N/A 04/21/2018   examined portion of the ileum normal, two 3-5 mm polyps, diverticulosis in the rectosigmoid and sigmoid colon, external and internal hemorrhoids, significant looping of the colon.  Pathology with tubular adenomas.  Recommendations to follow high-fiber diet and repeat colonoscopy in 5 to 10 years with peds colonoscope.  Marland Kitchen DILATION AND CURETTAGE OF  UTERUS    . ESOPHAGEAL DILATION  12/24/2011   SLF: A stricture was found in the distal esophagus/ Moderate gastritis/ MILD Duodenitis  . ESOPHAGOGASTRODUODENOSCOPY  02/07/09   mild gastritis  . ESOPHAGOGASTRODUODENOSCOPY (EGD) WITH PROPOFOL N/A 06/30/2012   SLF: 1. No definite stricture appreciated 2. small hiatal hernia 3. Gastritis  . ESOPHAGOGASTRODUODENOSCOPY (EGD) WITH PROPOFOL N/A 02/20/2016   Procedure: ESOPHAGOGASTRODUODENOSCOPY (EGD) WITH PROPOFOL;  Surgeon: Danie Binder, MD;  [facility];  Service: Endoscopy;  Laterality: N/A;  930  . ESOPHAGOGASTRODUODENOSCOPY (EGD) WITH PROPOFOL N/A 11/24/2018   benign-appearing esophageal peptic stricture s/p dilation, small hiatal hernia, moderate erosive gastritis.   Marland Kitchen EYE SURGERY    . gum flap Bilateral   . HEMICOLECTOMY  RIGHT 2006  . HERNIA REPAIR    . LUMBAR DISC SURGERY    . POLYPECTOMY  04/21/2018   Procedure: POLYPECTOMY;  Surgeon: Danie Binder, MD;  [facility];  Service: Endoscopy;;  (colon)  . SAVORY DILATION N/A 06/30/2012   Procedure: SAVORY DILATION;  Surgeon: Danie Binder, MD;  Location: [facility];  Service: Endoscopy;  Laterality: N/A;  12.64m , 154m 1570m. SAVORY DILATION N/A 02/20/2016   Procedure: SAVORY DILATION;  Surgeon: SanDanie BinderD;  [facility];  Service: Endoscopy;  Laterality: N/A;  . SAVORY DILATION N/A 11/24/2018   Procedure: SAVORY DILATION;  Surgeon: FieDanie BinderD;  [facility];  Service: Endoscopy;  Laterality: N/A;  . UPPER GASTROINTESTINAL ENDOSCOPY  JAN 2009 ABD PAIN   GASTRITIS, ESO RING  . UPPER GASTROINTESTINAL ENDOSCOPY  OCT 2010 ABD PAIN, DYSPHAGIA   DIL 15MM, GASTRITIS, NL DUODENUM  . UPPER GASTROINTESTINAL ENDOSCOPY  OCT 2010 DYSPHAGIA   DIL 17 MM, GASTRITIS, NL DUODENUM  . vocal cord surgery  Sep 20, 2010   precancerous areas removed  . wisdom tooth extraction Right    pin placement due to broken jaw    Family History  Problem  Relation Age of Onset  . Hypothyroidism Mother   . Heart disease Father   . Parkinson's disease Father   . Hypothyroidism Daughter   . Heart attack Paternal Grandfather   . Alzheimer's disease Paternal Grandmother   . Heart attack Maternal Grandfather   . Crohn's disease Sister   . Other Sister        was murdered  . Crohn's disease Maternal Uncle   . Colon cancer Neg Hx   . Colon polyps Neg Hx     Social History   Socioeconomic History  . Marital status: Single    Spouse name: [employer] Number of children: 2  . Years of education: GED  . Highest education level: Not on file  Occupational  History    Comment: disabled  Tobacco Use  . Smoking status: Current Every Day Smoker    Packs/day: 0.25    Years: 30.00    Pack years: 7.50    Types: Cigarettes  . Smokeless tobacco: Never Used  Vaping Use  . Vaping Use: Never used  Substance and Sexual Activity  . Alcohol use: Yes    Comment: wine/beer a few times a week   . Drug use: No  . Sexual activity: Not Currently    Birth control/protection: Post-menopausal  Other Topics Concern  . Not on file  Social History Narrative   Lives with husband   no caffiene   Social Determinants of Health   Financial Resource Strain: Not on file  Food Insecurity: Not on file  Transportation Needs: Not on file  Physical Activity: Not on file  Stress: Not on file  Social Connections: Not on file  Intimate Partner Violence: Not on file    ROS Review of Systems  Constitutional: Negative for chills and fever.  HENT: Negative for congestion, sinus pressure, sinus pain and sore throat.   Eyes: Negative for pain and discharge.  Respiratory: Negative for cough and shortness of breath.   Cardiovascular: Negative for chest pain and palpitations.  Gastrointestinal: Positive for abdominal pain (chronic). Negative for constipation, diarrhea, nausea and vomiting.  Endocrine: Negative for polydipsia and polyuria.  Genitourinary: Negative for  dysuria and hematuria.  Musculoskeletal: Positive for arthralgias, back pain and neck pain. Negative for neck stiffness.  Skin: Negative for rash.  Neurological: Negative for dizziness and weakness.  Psychiatric/Behavioral: Negative for agitation and behavioral problems. The patient is nervous/anxious.     Objective:   Today's Vitals: BP 123/71 (BP Location: Right Arm, Patient Position: Sitting, Cuff Size: Normal)   Pulse 89   Temp 99.1 F (37.3 C) (Oral)   Resp 18   Ht 5' (1.524 m)   Wt 114 lb 6.4 oz (51.9 kg)   SpO2 95%   BMI 22.34 kg/m   Physical Exam Vitals reviewed.  Constitutional:      General: She is not in acute distress.    Appearance: She is not diaphoretic.  HENT:     Head: Normocephalic and atraumatic.     Nose: Nose normal.     Mouth/Throat:     Mouth: Mucous membranes are moist.  Eyes:     General: No scleral icterus.    Extraocular Movements: Extraocular movements intact.     Pupils: Pupils are equal, round, and reactive to light.  Cardiovascular:     Rate and Rhythm: Normal rate and regular rhythm.     Pulses: Normal pulses.     Heart sounds: No murmur heard.   Pulmonary:     Breath sounds: Normal breath sounds. No wheezing or rales.  Abdominal:     Palpations: Abdomen is soft.     Tenderness: There is no abdominal tenderness.  Musculoskeletal:     Cervical back: Neck supple. No tenderness.     Right lower leg: No edema.     Left lower leg: No edema.  Skin:    General: Skin is warm.     Findings: Erythema (Circular area about 1 cm in diameter, mild swelling, no warmth over left hand, dorsal aspect) present.  Neurological:     General: No focal deficit present.     Mental Status: She is alert and oriented to person, place, and time.  Psychiatric:        Mood and Affect:  Mood normal.        Behavior: Behavior normal.     Assessment & Plan:   Problem List Items Addressed This Visit      Encounter to establish care    -  Primary Care  established Previous chart reviewed History and medications reviewed with the patient     Cardiovascular and Mediastinum   Essential hypertension    BP Readings from Last 1 Encounters:  07/19/20 123/71   Well-controlled with Amlodipine 5 mg QD Counseled for compliance with the medications Advised DASH diet and moderate exercise/walking, at least 150 mins/week         Respiratory   Chronic bronchitis (HCC)    Continues to smoke about 3-4 cigarettes/day Albuterol PRN        Digestive   GERD    Takes Dexilant F/u with GI      Crohn's disease of small intestine with complication ([place])    S/p partial bowel resection F/u with GI Has tried multiple medications in the past On chronic pain medications        Musculoskeletal and Integument   Spondylolisthesis, lumbar region    Chronic low back pain S/p spinal fusion surgery On Gabapentin and Percocet, f/u pain clinic        Other   Anxiety state    On Valium 5 mg q12h PRN Receives it from pain clinic      Chronic pain syndrome    From chronic back pain and IBD On Gabapentin and Percocet F/u with Dr Merlene Laughter       Other Visit Diagnoses       Cellulitis, unspecified cellulitis site     Denies any recent known insect bite [place] Prescribed Doxycycline 100 mg BID X 7 days   Relevant Medications   doxycycline (VIBRA-TABS) 100 MG tablet   Pain management       Relevant Orders   Ambulatory referral to Neurology   Screening mammogram for breast cancer       Relevant Orders   MM 3D SCREEN BREAST BILATERAL      Outpatient Encounter Medications as of 07/19/2020  Medication Sig  . albuterol (VENTOLIN HFA) 108 (90 Base) MCG/ACT inhaler Inhale 2 puffs into the lungs every 6 (six) hours as needed for wheezing or shortness of breath.  Marland Kitchen amLODipine (NORVASC) 5 MG tablet Take 1 tablet by mouth once daily  . benzonatate (TESSALON) 200 MG capsule Take 1 capsule by mouth three times daily as needed for cough   . calcium carbonate (TUMS - DOSED IN MG ELEMENTAL CALCIUM) 500 MG chewable tablet Chew 1 tablet (200 mg of elemental calcium total) by mouth 3 (three) times daily with meals.  . cholestyramine (QUESTRAN) 4 GM/DOSE powder DISSOLVE AND TAKE 2 GRAMS BY MOUTH TWICE DAILY WITH A MEAL. DO NOT TAKE WITHIN 2 HOURS OF OTHER MEDICATIONS.  Marland Kitchen clotrimazole (CLOTRIMAZOLE AF) 1 % cream Apply 1 application topically 2 (two) times daily. To affected area for 1-2 weeks  . cyclobenzaprine (FLEXERIL) 10 MG tablet Take 10 mg by mouth as needed for muscle spasms.   Marland Kitchen dexlansoprazole (DEXILANT) 60 MG capsule 1 PO EVERY MORNING WITH BREAKFAST.  . diazepam (VALIUM) 5 MG tablet Take 5 mg by mouth every 12 (twelve) hours as needed for anxiety.  Marland Kitchen doxycycline (VIBRA-TABS) 100 MG tablet Take 1 tablet (100 mg total) by mouth 2 (two) times daily.  . fluticasone (FLONASE) 50 MCG/ACT nasal spray USE TWO SPRAY(S) IN EACH NOSTRIL ONCE DAILY AS  NEEDED FOR ALLERGY OR RHINITIS (Patient taking differently: Place 2 sprays into both nostrils daily as needed for allergies or rhinitis.)  . gabapentin (NEURONTIN) 400 MG capsule Take 1 capsule (400 mg total) by mouth 4 (four) times daily.  . hyoscyamine (LEVSIN SL) 0.125 MG SL tablet Place 1 tablet (0.125 mg total) under the tongue every 6 (six) hours as needed. For abdominal cramping  . lactase (LACTAID) 3000 units tablet Take 3,000 Units by mouth daily as needed (lactose intolerance).  . Lidocaine HCl 4 % LIQD Apply 1 application topically 3 (three) times daily as needed (pain).  Marland Kitchen loratadine (CLARITIN) 10 MG tablet Take 1 tablet (10 mg total) by mouth daily. (Patient taking differently: Take 10 mg by mouth daily as needed for allergies.)  . magnesium hydroxide (MILK OF MAGNESIA) 400 MG/5ML suspension Take 30 mLs by mouth daily as needed for mild constipation.  . Menthol 10 % AERO Apply 1 application topically 3 (three) times daily as needed (pain).  . nystatin (MYCOSTATIN) 100000 UNIT/ML  suspension TAKE 5 ML BY MOUTH  4 TIMES DAILY AS NEEDED FOR THRUSH  . oxyCODONE-acetaminophen (PERCOCET) 10-325 MG tablet Take 1 tablet by mouth every 6 (six) hours as needed for pain.   . SYMBICORT 80-4.5 MCG/ACT inhaler Inhale 2 puffs into the lungs 2 (two) times daily.  Marland Kitchen UNABLE TO FIND Capsule to rebuild cells-BID  . UNABLE TO FIND as needed. Penetrex cream  . valACYclovir (VALTREX) 1000 MG tablet Take 1 tablet (1,000 mg total) by mouth 2 (two) times daily. (Patient taking differently: Take 1,000 mg by mouth as needed.)  . [DISCONTINUED] azithromycin (ZITHROMAX) 250 MG tablet 2 tabs poqday1, 1 tab poqday 2-5 (Patient not taking: Reported on 07/19/2020)  . [DISCONTINUED] predniSONE (DELTASONE) 20 MG tablet 3 tabs poqday 1-2, 2 tabs poqday 3-4, 1 tab poqday 5-6 (Patient not taking: Reported on 07/19/2020)   No facility-administered encounter medications on file as of 07/19/2020.    Follow-up: Return in about 4 months (around 11/18/2020).   Lindell Spar, MD

## 2020-07-19 NOTE — Assessment & Plan Note (Signed)
From chronic back pain and IBD On Gabapentin and Percocet F/u with Dr Merlene Laughter

## 2020-07-27 ENCOUNTER — Ambulatory Visit (HOSPITAL_COMMUNITY)
Admission: RE | Admit: 2020-07-27 | Discharge: 2020-07-27 | Disposition: A | Discharge status: Home / Self Care | Payer: Medicare Other | Source: Ambulatory Visit | Attending: Internal Medicine | Admitting: Internal Medicine

## 2020-07-27 DIAGNOSIS — Z1231 Encounter for screening mammogram for malignant neoplasm of breast: Secondary | ICD-10-CM | POA: Insufficient documentation

## 2020-07-28 ENCOUNTER — Ambulatory Visit (INDEPENDENT_AMBULATORY_CARE_PROVIDER_SITE_OTHER): Payer: Medicare Other | Admitting: Internal Medicine

## 2020-07-28 ENCOUNTER — Other Ambulatory Visit: Payer: Self-pay

## 2020-07-28 ENCOUNTER — Encounter: Payer: Self-pay | Admitting: Internal Medicine

## 2020-07-28 VITALS — BP 161/86 | Ht 60.0 in | Wt 115.1 lb

## 2020-07-28 DIAGNOSIS — L309 Dermatitis, unspecified: Secondary | ICD-10-CM | POA: Diagnosis not present

## 2020-07-28 DIAGNOSIS — R21 Rash and other nonspecific skin eruption: Secondary | ICD-10-CM | POA: Diagnosis not present

## 2020-07-28 MED ORDER — TRIAMCINOLONE ACETONIDE 0.1 % EX CREA: 1.0000 "application " | TOPICAL_CREAM | Freq: Two times a day (BID) | CUTANEOUS | 0 refills | Status: AC

## 2020-07-28 NOTE — Progress Notes (Signed)
Acute Office Visit  Subjective:    Patient ID: [name] [name], female    DOB: 1955-12-07, 65 y.o.   MRN: [id number]  Chief Complaint  Patient presents with  . sores on hands     Wearing gloves because she has oozing sores on hands and the med she was given before not helping     HPI Patient is in today for evaluation of a skin rash over both hands over dorsal aspect. She was given Doxycycline considering infectious etiology. She has it for last few months, but has been recurring. She has been applying calamine lotion over it. She also has worn gloves over both hands as she states that it oozes at times. She denies any recent injury. But she has been dealing with mold exposure at her home.  Past Medical History:  Diagnosis Date  . Allergic rhinitis   . Anastomotic ulcer JAN 2009  . Anxiety   . Arthritis   . Asthma   . BMI (body mass index) 20.0-29.9 2009 121 lbs  . Concussion   . 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 Crohn disease ([place])   . CTS (carpal tunnel syndrome)   . Diarrhea MULITIFACTORIAL   IBS, LACTOSE INTOLERANCE, SBBO, BILE-SALT  . Elevated liver enzymes 2009: BMI 24 ?Etoh ALT 94, AST 51,  NEG ANA, qIGs& ASMA   MAY 2012 AST 52 ALT 38  . Esophageal stricture 2009  . GERD (gastroesophageal reflux disease)   . Hemorrhoid   . Hyperlipemia   . Hypertension   . Inflammatory bowel disease 2006 CD   SURGICAL REMISSION  . LBP (low back pain)   . Mental disorder   . Migraine   . Osteoporosis   . PONV (postoperative nausea and vomiting)   . Shortness of breath    exertion and humidity  . Shoulder pain    right  . Sleep apnea    Stop [name]    Past Surgical History:  Procedure Laterality Date  . BACK SURGERY    . BACK SURGERY  07/16/2018  . BILATERAL SALPINGOOPHORECTOMY    . BIOPSY  12/24/2011   Procedure: BIOPSY;  Surgeon: Danie Binder, MD;  Location: [facility];  Service: Endoscopy;;  Gastric Biopsies  . BIOPSY N/A 06/30/2012   Procedure:  BIOPSY;  Surgeon: Danie Binder, MD;  Location: [facility];  Service: Endoscopy;  Laterality: N/A;  Gastric and Esophageal Biopsies  . CARPAL TUNNEL RELEASE  LEFT  . CATARACT EXTRACTION W/PHACO Right 07/30/2016   Procedure: CATARACT EXTRACTION PHACO AND INTRAOCULAR LENS PLACEMENT (IOC);  Surgeon: Rutherford Guys, MD;  Location: [facility];  Service: Ophthalmology;  Laterality: Right;  CDE: 5.45  . CATARACT EXTRACTION W/PHACO Left 08/13/2016   Procedure: CATARACT EXTRACTION PHACO AND INTRAOCULAR LENS PLACEMENT (IOC);  Surgeon: Rutherford Guys, MD;  Location: [facility];  Service: Ophthalmology;  Laterality: Left;  CDE: 5.59  . COLON SURGERY    . COLONOSCOPY  JAN 2009 DIARRHEA, ABD PAIN, BRBPR   ANASTOMOTIC ULCER(3MM), IH SW:FUXNAT ULCER, NL COLON bX  . COLONOSCOPY WITH PROPOFOL N/A 04/21/2018   examined portion of the ileum normal, two 3-5 mm polyps, diverticulosis in the rectosigmoid and sigmoid colon, external and internal hemorrhoids, significant looping of the colon.  Pathology with tubular adenomas.  Recommendations to follow high-fiber diet and repeat colonoscopy in 5 to 10 years with peds colonoscope.  Marland Kitchen DILATION AND CURETTAGE OF UTERUS    . ESOPHAGEAL DILATION  12/24/2011  SLF: A stricture was found in the distal esophagus/ Moderate gastritis/ MILD Duodenitis  . ESOPHAGOGASTRODUODENOSCOPY  02/07/09   mild gastritis  . ESOPHAGOGASTRODUODENOSCOPY (EGD) WITH PROPOFOL N/A 06/30/2012   SLF: 1. No definite stricture appreciated 2. small hiatal hernia 3. Gastritis  . ESOPHAGOGASTRODUODENOSCOPY (EGD) WITH PROPOFOL N/A 02/20/2016   Procedure: ESOPHAGOGASTRODUODENOSCOPY (EGD) WITH PROPOFOL;  Surgeon: Danie Binder, MD;  [facility];  Service: Endoscopy;  Laterality: N/A;  930  . ESOPHAGOGASTRODUODENOSCOPY (EGD) WITH PROPOFOL N/A 11/24/2018   benign-appearing esophageal peptic stricture s/p dilation, small hiatal hernia, moderate erosive gastritis.   Marland Kitchen EYE SURGERY    . gum flap Bilateral   . HEMICOLECTOMY   RIGHT 2006  . HERNIA REPAIR    . LUMBAR DISC SURGERY    . POLYPECTOMY  04/21/2018   Procedure: POLYPECTOMY;  Surgeon: Danie Binder, MD;  [facility];  Service: Endoscopy;;  (colon)  . SAVORY DILATION N/A 06/30/2012   Procedure: SAVORY DILATION;  Surgeon: Danie Binder, MD;  Location: [facility];  Service: Endoscopy;  Laterality: N/A;  12.36m , 141m 1532m. SAVORY DILATION N/A 02/20/2016   Procedure: SAVORY DILATION;  Surgeon: SanDanie BinderD;  [facility];  Service: Endoscopy;  Laterality: N/A;  . SAVORY DILATION N/A 11/24/2018   Procedure: SAVORY DILATION;  Surgeon: FieDanie BinderD;  [facility];  Service: Endoscopy;  Laterality: N/A;  . UPPER GASTROINTESTINAL ENDOSCOPY  JAN 2009 ABD PAIN   GASTRITIS, ESO RING  . UPPER GASTROINTESTINAL ENDOSCOPY  OCT 2010 ABD PAIN, DYSPHAGIA   DIL 15MM, GASTRITIS, NL DUODENUM  . UPPER GASTROINTESTINAL ENDOSCOPY  OCT 2010 DYSPHAGIA   DIL 17 MM, GASTRITIS, NL DUODENUM  . vocal cord surgery  Sep 20, 2010   precancerous areas removed  . wisdom tooth extraction Right    pin placement due to broken jaw    Family History  Problem Relation Age of Onset  . Hypothyroidism Mother   . Heart disease Father   . Parkinson's disease Father   . Hypothyroidism Daughter   . Heart attack Paternal Grandfather   . Alzheimer's disease Paternal Grandmother   . Heart attack Maternal Grandfather   . Crohn's disease Sister   . Other Sister        was murdered  . Crohn's disease Maternal Uncle   . Colon cancer Neg Hx   . Colon polyps Neg Hx     Social History   Socioeconomic History  . Marital status: Single    Spouse name: WayMiner Number of children: 2  . Years of education: GED  . Highest education level: Not on file  Occupational History    Comment: disabled  Tobacco Use  . Smoking status: Current Every Day Smoker    Packs/day: 0.25    Years: 30.00    Pack years: 7.50    Types: Cigarettes  . Smokeless  tobacco: Never Used  Vaping Use  . Vaping Use: Never used  Substance and Sexual Activity  . Alcohol use: Yes    Comment: wine/beer a few times a week   . Drug use: No  . Sexual activity: Not Currently    Birth control/protection: Post-menopausal  Other Topics Concern  . Not on file  Social History Narrative   Lives with husband   no caffiene   Social Determinants of Health   Financial Resource Strain: Not on file  Food Insecurity: Not on file  Transportation Needs: Not on  file  Physical Activity: Not on file  Stress: Not on file  Social Connections: Not on file  Intimate Partner Violence: Not on file    Outpatient Medications Prior to Visit  Medication Sig Dispense Refill  . albuterol (VENTOLIN HFA) 108 (90 Base) MCG/ACT inhaler Inhale 2 puffs into the lungs every 6 (six) hours as needed for wheezing or shortness of breath. 18 g 11  . amLODipine (NORVASC) 5 MG tablet Take 1 tablet by mouth once daily 90 tablet 0  . calcium carbonate (TUMS - DOSED IN MG ELEMENTAL CALCIUM) 500 MG chewable tablet Chew 1 tablet (200 mg of elemental calcium total) by mouth 3 (three) times daily with meals. 90 tablet 6  . cholestyramine (QUESTRAN) 4 GM/DOSE powder DISSOLVE AND TAKE 2 GRAMS BY MOUTH TWICE DAILY WITH A MEAL. DO NOT TAKE WITHIN 2 HOURS OF OTHER MEDICATIONS. 378 g 0  . clotrimazole (CLOTRIMAZOLE AF) 1 % cream Apply 1 application topically 2 (two) times daily. To affected area for 1-2 weeks 30 g 0  . cyclobenzaprine (FLEXERIL) 10 MG tablet Take 10 mg by mouth as needed for muscle spasms.     Marland Kitchen dexlansoprazole (DEXILANT) 60 MG capsule 1 PO EVERY MORNING WITH BREAKFAST. 90 capsule 3  . diazepam (VALIUM) 5 MG tablet Take 5 mg by mouth every 12 (twelve) hours as needed for anxiety.    . fluticasone (FLONASE) 50 MCG/ACT nasal spray USE TWO SPRAY(S) IN EACH NOSTRIL ONCE DAILY AS NEEDED FOR ALLERGY OR RHINITIS (Patient taking differently: Place 2 sprays into both nostrils daily as needed for  allergies or rhinitis.) 16 g 2  . gabapentin (NEURONTIN) 400 MG capsule Take 1 capsule (400 mg total) by mouth 4 (four) times daily.    . hyoscyamine (LEVSIN SL) 0.125 MG SL tablet Place 1 tablet (0.125 mg total) under the tongue every 6 (six) hours as needed. For abdominal cramping 30 tablet 0  . lactase (LACTAID) 3000 units tablet Take 3,000 Units by mouth daily as needed (lactose intolerance).    . Lidocaine HCl 4 % LIQD Apply 1 application topically 3 (three) times daily as needed (pain).    Marland Kitchen loratadine (CLARITIN) 10 MG tablet Take 1 tablet (10 mg total) by mouth daily. (Patient taking differently: Take 10 mg by mouth daily as needed for allergies.) 30 tablet 3  . magnesium hydroxide (MILK OF MAGNESIA) 400 MG/5ML suspension Take 30 mLs by mouth daily as needed for mild constipation.    . Menthol 10 % AERO Apply 1 application topically 3 (three) times daily as needed (pain).    . nystatin (MYCOSTATIN) 100000 UNIT/ML suspension TAKE 5 ML BY MOUTH  4 TIMES DAILY AS NEEDED FOR THRUSH 500 mL 0  . oxyCODONE-acetaminophen (PERCOCET) 10-325 MG tablet Take 1 tablet by mouth every 6 (six) hours as needed for pain.     . SYMBICORT 80-4.5 MCG/ACT inhaler Inhale 2 puffs into the lungs 2 (two) times daily. 11 g 11  . UNABLE TO FIND Capsule to rebuild cells-BID    . UNABLE TO FIND as needed. Penetrex cream    . valACYclovir (VALTREX) 1000 MG tablet Take 1 tablet (1,000 mg total) by mouth 2 (two) times daily. (Patient taking differently: Take 1,000 mg by mouth as needed.) 14 tablet 0  . benzonatate (TESSALON) 200 MG capsule Take 1 capsule by mouth three times daily as needed for cough 30 capsule 0  . doxycycline (VIBRA-TABS) 100 MG tablet Take 1 tablet (100 mg total) by mouth 2 (two) times  daily. 14 tablet 0   No facility-administered medications prior to visit.    Allergies  Allergen Reactions  . Naproxen Shortness Of Breath    Chest pain  . Eggs Or Egg-Derived Products Diarrhea    abd pain  .  Lovastatin Other (See Comments)    Chest pain  . Toradol [Ketorolac Tromethamine] Other (See Comments)    Headache. Non-effective  . Tramadol Other (See Comments)    Headache. Non-effective.  Marland Kitchen Nexium [Esomeprazole Magnesium] Diarrhea    Abdominal pain    Review of Systems  Constitutional: Negative for chills and fever.  HENT: Negative for congestion, sinus pressure, sinus pain and sore throat.   Eyes: Negative for pain and discharge.  Respiratory: Negative for cough and shortness of breath.   Cardiovascular: Negative for chest pain and palpitations.  Gastrointestinal: Positive for abdominal pain (chronic). Negative for constipation, diarrhea, nausea and vomiting.  Endocrine: Negative for polydipsia and polyuria.  Genitourinary: Negative for dysuria and hematuria.  Musculoskeletal: Positive for arthralgias, back pain and neck pain. Negative for neck stiffness.  Skin: Negative for rash.  Neurological: Negative for dizziness and weakness.  Psychiatric/Behavioral: Negative for agitation and behavioral problems. The patient is nervous/anxious.        Objective:    Physical Exam Vitals reviewed.  Constitutional:      General: She is not in acute distress.    Appearance: She is not diaphoretic.  HENT:     Head: Normocephalic and atraumatic.     Nose: Nose normal.     Mouth/Throat:     Mouth: Mucous membranes are moist.  Eyes:     General: No scleral icterus.    Extraocular Movements: Extraocular movements intact.     Pupils: Pupils are equal, round, and reactive to light.  Cardiovascular:     Rate and Rhythm: Normal rate and regular rhythm.     Pulses: Normal pulses.     Heart sounds: No murmur heard.   Pulmonary:     Breath sounds: Normal breath sounds. No wheezing or rales.  Abdominal:     Palpations: Abdomen is soft.     Tenderness: There is no abdominal tenderness.  Musculoskeletal:     Cervical back: Neck supple. No tenderness.     Right lower leg: No edema.      Left lower leg: No edema.  Skin:    General: Skin is warm.     Findings: Erythema (Circular area about 1 cm in diameter, mild swelling, no warmth over left hand, dorsal aspect    Right hand - about 5 mm diameter mild erythema) present.  Neurological:     General: No focal deficit present.     Mental Status: She is alert and oriented to person, place, and time.  Psychiatric:        Mood and Affect: Mood normal.        Behavior: Behavior normal.     BP (!) 161/86   Ht 5' (1.524 m)   Wt 115 lb 1.9 oz (52.2 kg)   BMI 22.48 kg/m  Wt Readings from Last 3 Encounters:  07/28/20 115 lb 1.9 oz (52.2 kg)  07/19/20 114 lb 6.4 oz (51.9 kg)  06/27/20 117 lb (53.1 kg)    Health Maintenance Due  Topic Date Due  . HIV Screening  Never done  . MAMMOGRAM  02/27/2020  . PAP SMEAR-Modifier  04/02/2020    There are no preventive care reminders to display for this patient.   Lab Results  Component Value Date   TSH 1.010 12/27/2011   Lab Results  Component Value Date   WBC 2.7 (L) 01/31/2020   HGB 15.1 (H) 01/31/2020   HCT 43.3 01/31/2020   MCV 104.6 (H) 01/31/2020   PLT 175 01/31/2020   Lab Results  Component Value Date   NA 138 01/31/2020   K 3.7 01/31/2020   CO2 26 01/31/2020   GLUCOSE 80 01/31/2020   BUN 5 (L) 01/31/2020   CREATININE 0.42 (L) 01/31/2020   BILITOT 0.6 01/31/2020   ALKPHOS 120 01/31/2020   AST 47 (H) 01/31/2020   ALT 33 01/31/2020   PROT 6.9 01/31/2020   ALBUMIN 3.9 01/31/2020   CALCIUM 8.8 (L) 01/31/2020   ANIONGAP 12 01/31/2020   Lab Results  Component Value Date   CHOL 271 (H) 01/31/2020   Lab Results  Component Value Date   HDL >135 01/31/2020   Lab Results  Component Value Date   LDLCALC NOT CALCULATED 01/31/2020   Lab Results  Component Value Date   TRIG 73 01/31/2020   Lab Results  Component Value Date   CHOLHDL NOT CALCULATED 01/31/2020   No results found for: HGBA1C     Assessment & Plan:   Problem List Items Addressed This  Visit   None   Visit Diagnoses    Eczema, unspecified type    -  Primary Seems to be related to hypersensitivity reaction due to mold exposure Tried Doxycycline considering infectious etiology, but no change Trial of Kenalog Referred to Dermatology   Relevant Medications   triamcinolone (KENALOG) 0.1 %   Skin rash       Relevant Orders   Ambulatory referral to Dermatology       Meds ordered this encounter  Medications  . triamcinolone (KENALOG) 0.1 %    Sig: Apply 1 application topically 2 (two) times daily.    Dispense:  30 g    Refill:  0     Pink Maye Keith Rake, MD

## 2020-07-28 NOTE — Patient Instructions (Signed)
Eczema Eczema refers to a group of skin conditions that cause skin to become rough and inflamed. Each type of eczema has different triggers, symptoms, and treatments. Eczema of any type is usually itchy. Symptoms range from mild to severe. Eczema is not spread from person to person (is not contagious). It can appear on different parts of the body at different times. One person's eczema may look different from another person's eczema. What are the causes? The exact cause of this condition is not known. However, exposure to certain environmental factors, irritants, and allergens can make the condition worse. What are the signs or symptoms? Symptoms of this condition depend on the type of eczema you have. The types include:  Contact dermatitis. There are two kinds: ? Irritant contact dermatitis. This happens when something irritates the skin and causes a rash. ? Allergic contact dermatitis. This happens when your skin comes in contact with something you are allergic to (allergens). This can include poison ivy, chemicals, or medicines that were applied to your skin.  Atopic dermatitis. This is a long-term (chronic) skin disease that keeps coming back (recurring). It is the most common type of eczema. Usual symptoms are a red rash and itchy, dry, scaly skin. It usually starts showing signs in infancy and can last through adulthood.  Dyshidrotic eczema. This is a form of eczema on the hands and feet. It shows up as very itchy, fluid-filled blisters. It can affect people of any age but is more common before age 77.  Hand eczema. This causes very itchy areas of skin on the palms and sides of the hands and fingers. This type of eczema is common in industrial jobs where you may be exposed to different types of irritants.  Lichen simplex chronicus. This type of eczema occurs when a person constantly scratches one area of the body. Repeated scratching of the area leads to thickened skin (lichenification). This  condition can accompany other types of eczema. It is more common in adults but may also be seen in children.  Nummular eczema. This is a common type of eczema that most often affects the lower legs and the backs of the hands. It typically causes an itchy, red, circular, crusty lesion (plaque). Scratching may become a habit and can cause bleeding. Nummular eczema occurs most often in middle-aged or older people.  Seborrheic dermatitis. This is a common skin disease that mainly affects the scalp. It may also affect other oily areas of the body, such as the face, sides of the nose, eyebrows, ears, eyelids, and chest. It is marked by small scaling and redness of the skin (erythema). This can affect people of all ages. In infants, this condition is called cradle cap.  Stasis dermatitis. This is a common skin disease that can cause itching, scaling, and hyperpigmentation, usually on the legs and feet. It occurs most often in people who have a condition that prevents blood from being pumped through the veins in the legs (chronic venous insufficiency). Stasis dermatitis is a chronic condition that needs long-term management.   How is this diagnosed? This condition may be diagnosed based on:  A physical exam of your skin.  Your medical history.  Skin patch tests. These tests involve using patches that contain possible allergens and placing them on your back. Your health care provider will check in a few days to see if an allergic reaction occurred. How is this treated? Treatment for eczema is based on the type of eczema you have. You may be  given hydrocortisone steroid medicine or antihistamines. These can relieve itching quickly and help reduce inflammation. These may be prescribed or purchased over the counter, depending on the strength that is needed. Follow these instructions at home:  Take or apply over-the-counter and prescription medicines only as told by your health care provider.  Use creams or  ointments to moisturize your skin. Do not use lotions.  Learn what triggers or irritates your symptoms so you can avoid these things.  Treat symptom flare-ups quickly.  Do not scratch your skin. This can make your rash worse.  Keep all follow-up visits. This is important. Where to find more information  American Academy of Dermatology: MemberVerification.ca  National Eczema Association: nationaleczema.org  The Society for Pediatric Dermatology: pedsderm.net Contact a health care provider if:  You have severe itching, even with treatment.  You scratch your skin regularly until it bleeds.  Your rash looks different than usual.  Your skin is painful, swollen, or more red than usual.  You have a fever. Summary  Eczema refers to a group of skin conditions that cause skin to become rough and inflamed. Each type has different triggers.  Eczema of any type causes itching that may range from mild to severe.  Treatment varies based on the type of eczema you have. Hydrocortisone steroid medicine or antihistamines can help with itching and inflammation.  Protecting your skin is the best way to prevent eczema. Use creams or ointments to moisturize your skin. Avoid triggers and irritants. Treat flare-ups quickly. This information is not intended to replace advice given to you by your health care provider. Make sure you discuss any questions you have with your health care provider. Document Revised: 01/31/2020 Document Reviewed: 01/31/2020 Elsevier Patient Education  2021 [employer].

## 2020-08-07 ENCOUNTER — Other Ambulatory Visit: Payer: Self-pay | Admitting: Family Medicine

## 2020-08-10 ENCOUNTER — Other Ambulatory Visit: Payer: Self-pay | Admitting: Family Medicine

## 2020-08-11 ENCOUNTER — Telehealth: Payer: Self-pay

## 2020-08-11 NOTE — Telephone Encounter (Signed)
PT lvm that the provider was suppose to call in cough meds, tesslon pearls

## 2020-08-11 NOTE — Telephone Encounter (Signed)
Left message

## 2020-08-15 DIAGNOSIS — M4722 Other spondylosis with radiculopathy, cervical region: Secondary | ICD-10-CM | POA: Diagnosis not present

## 2020-08-15 DIAGNOSIS — M4316 Spondylolisthesis, lumbar region: Secondary | ICD-10-CM | POA: Diagnosis not present

## 2020-08-16 NOTE — Telephone Encounter (Signed)
Her last visit was 3/25 and I don't see anything about cough meds mentioned. Its not on her med list either. Called pt no answer

## 2020-08-17 DIAGNOSIS — M545 Low back pain, unspecified: Secondary | ICD-10-CM | POA: Diagnosis not present

## 2020-08-17 DIAGNOSIS — R109 Unspecified abdominal pain: Secondary | ICD-10-CM | POA: Diagnosis not present

## 2020-08-17 DIAGNOSIS — Z79899 Other long term (current) drug therapy: Secondary | ICD-10-CM | POA: Diagnosis not present

## 2020-08-17 DIAGNOSIS — M25551 Pain in right hip: Secondary | ICD-10-CM | POA: Diagnosis not present

## 2020-08-17 DIAGNOSIS — Z79891 Long term (current) use of opiate analgesic: Secondary | ICD-10-CM | POA: Diagnosis not present

## 2020-08-17 DIAGNOSIS — M542 Cervicalgia: Secondary | ICD-10-CM | POA: Diagnosis not present

## 2020-08-17 DIAGNOSIS — R1083 Colic: Secondary | ICD-10-CM | POA: Diagnosis not present

## 2020-08-17 DIAGNOSIS — G894 Chronic pain syndrome: Secondary | ICD-10-CM | POA: Diagnosis not present

## 2020-08-22 ENCOUNTER — Other Ambulatory Visit: Payer: Self-pay | Admitting: Family Medicine

## 2020-08-24 ENCOUNTER — Telehealth: Payer: Self-pay

## 2020-08-24 ENCOUNTER — Other Ambulatory Visit: Payer: Self-pay

## 2020-08-24 DIAGNOSIS — J302 Other seasonal allergic rhinitis: Secondary | ICD-10-CM

## 2020-08-24 MED ORDER — BENZONATATE 100 MG PO CAPS: 100.0000 mg | ORAL_CAPSULE | Freq: Two times a day (BID) | ORAL | 0 refills | Status: DC | PRN

## 2020-08-24 NOTE — Telephone Encounter (Signed)
Patient states that she is coughing due to her allergies and Dr. Buelah Manis always sends in [place]. Pt wants this sent in because she should not have to be seen for just seasonal allergies.

## 2020-08-31 ENCOUNTER — Other Ambulatory Visit: Payer: Self-pay

## 2020-08-31 ENCOUNTER — Telehealth: Payer: Medicare Other | Admitting: Internal Medicine

## 2020-09-03 ENCOUNTER — Other Ambulatory Visit: Payer: Self-pay | Admitting: Family Medicine

## 2020-09-04 ENCOUNTER — Other Ambulatory Visit: Payer: Self-pay | Admitting: *Deleted

## 2020-09-04 MED ORDER — AMLODIPINE BESYLATE 5 MG PO TABS: 1.0000 | ORAL_TABLET | Freq: Every day | ORAL | 0 refills | Status: DC

## 2020-09-13 DIAGNOSIS — M961 Postlaminectomy syndrome, not elsewhere classified: Secondary | ICD-10-CM | POA: Diagnosis not present

## 2020-09-13 DIAGNOSIS — M542 Cervicalgia: Secondary | ICD-10-CM | POA: Diagnosis not present

## 2020-09-13 DIAGNOSIS — K589 Irritable bowel syndrome without diarrhea: Secondary | ICD-10-CM | POA: Diagnosis not present

## 2020-09-13 DIAGNOSIS — G894 Chronic pain syndrome: Secondary | ICD-10-CM | POA: Diagnosis not present

## 2020-09-13 DIAGNOSIS — M545 Low back pain, unspecified: Secondary | ICD-10-CM | POA: Diagnosis not present

## 2020-09-13 DIAGNOSIS — Z79891 Long term (current) use of opiate analgesic: Secondary | ICD-10-CM | POA: Diagnosis not present

## 2020-10-18 DIAGNOSIS — M961 Postlaminectomy syndrome, not elsewhere classified: Secondary | ICD-10-CM | POA: Diagnosis not present

## 2020-10-18 DIAGNOSIS — G894 Chronic pain syndrome: Secondary | ICD-10-CM | POA: Diagnosis not present

## 2020-10-18 DIAGNOSIS — K589 Irritable bowel syndrome without diarrhea: Secondary | ICD-10-CM | POA: Diagnosis not present

## 2020-10-18 DIAGNOSIS — Z79891 Long term (current) use of opiate analgesic: Secondary | ICD-10-CM | POA: Diagnosis not present

## 2020-10-18 DIAGNOSIS — M542 Cervicalgia: Secondary | ICD-10-CM | POA: Diagnosis not present

## 2020-11-02 ENCOUNTER — Other Ambulatory Visit (HOSPITAL_COMMUNITY)
Admission: RE | Admit: 2020-11-02 | Discharge: 2020-11-02 | Disposition: A | Discharge status: Home / Self Care | Payer: Medicare Other | Source: Ambulatory Visit | Attending: Podiatry | Admitting: Podiatry

## 2020-11-02 DIAGNOSIS — M109 Gout, unspecified: Secondary | ICD-10-CM | POA: Diagnosis not present

## 2020-11-02 LAB — URIC ACID: Uric Acid, Serum: 5.4 mg/dL (ref 2.5–7.1)

## 2020-11-15 ENCOUNTER — Ambulatory Visit: Payer: Medicare Other | Admitting: Internal Medicine

## 2020-11-28 ENCOUNTER — Telehealth: Payer: Self-pay | Admitting: Internal Medicine

## 2020-11-28 NOTE — Telephone Encounter (Signed)
Recall for ultrasound 

## 2020-11-28 NOTE — Telephone Encounter (Signed)
Recall mailed 

## 2020-12-04 ENCOUNTER — Other Ambulatory Visit: Payer: Self-pay | Admitting: Internal Medicine

## 2020-12-04 DIAGNOSIS — J302 Other seasonal allergic rhinitis: Secondary | ICD-10-CM

## 2020-12-06 ENCOUNTER — Other Ambulatory Visit: Payer: Self-pay | Admitting: *Deleted

## 2020-12-06 MED ORDER — NYSTATIN 100000 UNIT/ML MT SUSP: OROMUCOSAL | 0 refills | Status: DC

## 2020-12-07 ENCOUNTER — Encounter: Payer: Self-pay | Admitting: Internal Medicine

## 2020-12-07 ENCOUNTER — Ambulatory Visit (INDEPENDENT_AMBULATORY_CARE_PROVIDER_SITE_OTHER): Payer: Medicare Other | Admitting: Internal Medicine

## 2020-12-07 ENCOUNTER — Other Ambulatory Visit: Payer: Self-pay | Admitting: *Deleted

## 2020-12-07 ENCOUNTER — Encounter: Payer: Self-pay | Admitting: *Deleted

## 2020-12-07 ENCOUNTER — Other Ambulatory Visit: Payer: Self-pay

## 2020-12-07 VITALS — BP 161/101 | HR 98 | Temp 97.8°F | Ht 60.0 in | Wt 108.4 lb

## 2020-12-07 DIAGNOSIS — K50019 Crohn's disease of small intestine with unspecified complications: Secondary | ICD-10-CM

## 2020-12-07 DIAGNOSIS — R197 Diarrhea, unspecified: Secondary | ICD-10-CM

## 2020-12-07 DIAGNOSIS — R1032 Left lower quadrant pain: Secondary | ICD-10-CM | POA: Diagnosis not present

## 2020-12-07 DIAGNOSIS — K219 Gastro-esophageal reflux disease without esophagitis: Secondary | ICD-10-CM | POA: Diagnosis not present

## 2020-12-07 MED ORDER — DICYCLOMINE HCL 10 MG PO CAPS: 10.0000 mg | ORAL_CAPSULE | Freq: Four times a day (QID) | ORAL | 1 refills | Status: DC

## 2020-12-07 NOTE — Progress Notes (Signed)
Referring Provider: Lindell Spar, MD Primary Care Physician:  Lindell Spar, MD Primary GI:  Dr. Abbey Chatters  Chief Complaint  Patient presents with   Abdominal Pain    RUQ pain ongoing, severe per pt   Nausea    No vomiting   Diarrhea    Daily, every time eat/drinks    HPI:   [name] [name] is a 65 y.o. female who presents to clinic today for follow-up visit.  She has a history of Crohn's disease status post right hemicolectomy and ileal resection 2006 with surgical remission.  Not chronically on any management for this.  Also has chronic GERD, dysphagia status post dilation in July 2020, abnormal LFTs in the past, chronic right upper quadrant pain.  Does have a 4 mm gallbladder polyp on ultrasound September 2021.  Negative HIDA scan.  Also showed fatty liver disease.  Today she states she is having severe abdominal pain.  Pain is primarily lower now.  Does not radiate.  Pain is 5-10.  Also notes diarrhea.  No melena hematochezia.  Recent blood work with normal CRP.  Did note an elevated TSH of 13.  Liver enzymes with AST 43, ALT 94.  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   Crohn disease ([place])    CTS (carpal tunnel syndrome)    Diarrhea MULITIFACTORIAL   IBS, LACTOSE INTOLERANCE, SBBO, BILE-SALT   Elevated liver enzymes 2009: BMI 24 ?Etoh ALT 94, AST 51,  NEG ANA, qIGs& ASMA   MAY 2012 AST 52 ALT 38   Esophageal stricture 2009   GERD (gastroesophageal reflux disease)    Hemorrhoid    Hyperlipemia    Hypertension    Inflammatory bowel disease 2006 CD   SURGICAL REMISSION   LBP (low back pain)    Mental disorder    Migraine    Osteoporosis    PONV (postoperative nausea and vomiting)    Shortness of breath    exertion and humidity   Shoulder pain    right   Sleep apnea    Stop  Bang Cock score of 4    Past Surgical History:  Procedure Laterality Date   BACK SURGERY     BACK SURGERY  07/16/2018   BILATERAL SALPINGOOPHORECTOMY     BIOPSY  12/24/2011   Procedure: BIOPSY;  Surgeon: Danie Binder, MD;  Location: [facility];  Service: Endoscopy;;  Gastric Biopsies   BIOPSY N/A 06/30/2012   Procedure: BIOPSY;  Surgeon: Danie Binder, MD;  Location: [facility];  Service: Endoscopy;  Laterality: N/A;  Gastric and Esophageal Biopsies   CARPAL TUNNEL RELEASE  LEFT   CATARACT EXTRACTION W/PHACO Right 07/30/2016   Procedure: CATARACT EXTRACTION PHACO AND INTRAOCULAR LENS PLACEMENT ([place]);  Surgeon: Rutherford Guys, MD;  Location: [facility];  Service: Ophthalmology;  Laterality: Right;  CDE: 5.45   CATARACT EXTRACTION W/PHACO Left 08/13/2016   Procedure: CATARACT EXTRACTION PHACO AND INTRAOCULAR LENS PLACEMENT ([facility]);  Surgeon: Rutherford Guys, MD;  Location: [facility];  Service: Ophthalmology;  Laterality: Left;  CDE: 5.59   COLON SURGERY     COLONOSCOPY  JAN 2009 DIARRHEA, ABD PAIN, BRBPR   ANASTOMOTIC ULCER(3MM), IH DE:YCXKGY ULCER, NL COLON bX   COLONOSCOPY WITH PROPOFOL N/A  04/21/2018   examined portion of the ileum normal, two 3-5 mm polyps, diverticulosis in the rectosigmoid and sigmoid colon, external and internal hemorrhoids, significant looping of the colon.  Pathology with tubular adenomas.  Recommendations to follow high-fiber diet and repeat colonoscopy in 5 to 10 years with peds colonoscope.   DILATION AND CURETTAGE OF UTERUS     ESOPHAGEAL DILATION  12/24/2011   SLF: A stricture was found in the distal esophagus/ Moderate gastritis/ MILD Duodenitis   ESOPHAGOGASTRODUODENOSCOPY  02/07/09   mild gastritis   ESOPHAGOGASTRODUODENOSCOPY (EGD) WITH PROPOFOL N/A 06/30/2012   SLF: 1. No definite stricture appreciated 2. small hiatal hernia 3. Gastritis   ESOPHAGOGASTRODUODENOSCOPY (EGD) WITH PROPOFOL N/A 02/20/2016   Procedure: ESOPHAGOGASTRODUODENOSCOPY (EGD) WITH PROPOFOL;  Surgeon: Danie Binder, MD;  [facility];  Service: Endoscopy;  Laterality: N/A;  930   ESOPHAGOGASTRODUODENOSCOPY (EGD) WITH PROPOFOL N/A 11/24/2018   benign-appearing esophageal peptic stricture s/p dilation, small hiatal hernia, moderate erosive gastritis.    EYE SURGERY     gum flap Bilateral    HEMICOLECTOMY  RIGHT 2006   HERNIA REPAIR     LUMBAR DISC SURGERY     POLYPECTOMY  04/21/2018   Procedure: POLYPECTOMY;  Surgeon: Danie Binder, MD;  [facility];  Service: Endoscopy;;  (colon)   SAVORY DILATION N/A 06/30/2012   Procedure: SAVORY DILATION;  Surgeon: Danie Binder, MD;  Location: [facility];  Service: Endoscopy;  Laterality: N/A;  12.97m , 150m 155m SAVORY DILATION N/A 02/20/2016   Procedure: SAVORY DILATION;  Surgeon: SanDanie BinderD;  [facility];  Service: Endoscopy;  Laterality: N/A;   SAVORY DILATION N/A 11/24/2018   Procedure: SAVORY DILATION;  Surgeon: FieDanie BinderD;  [facility];  Service: Endoscopy;  Laterality: N/A;   UPPER GASTROINTESTINAL ENDOSCOPY  JAN 2009 ABD PAIN   GASTRITIS, ESO RING   UPPER GASTROINTESTINAL ENDOSCOPY  OCT 2010 ABD PAIN, DYSPHAGIA   DIL 15MM, GASTRITIS, NL DUODENUM   UPPER GASTROINTESTINAL ENDOSCOPY  OCT 2010 DYSPHAGIA   DIL 17 MM, GASTRITIS, NL DUODENUM   vocal cord surgery  Sep 20, 2010   precancerous areas removed   wisdom tooth extraction Right    pin placement due to broken jaw    Current Outpatient Medications  Medication Sig Dispense Refill   albuterol (VENTOLIN HFA) 108 (90 Base) MCG/ACT inhaler Inhale 2 puffs into the lungs every 6 (six) hours as needed for wheezing or shortness of breath. 18 g 11   amLODipine (NORVASC) 5 MG tablet Take 1 tablet by mouth once daily 90 tablet 0   benzonatate (TESSALON) 100 MG capsule TAKE 1 CAPSULE BY MOUTH TWICE DAILY AS NEEDED FOR COUGH 30 capsule 0   calcium carbonate (TUMS - DOSED IN MG ELEMENTAL CALCIUM) 500 MG chewable tablet Chew 1 tablet (200 mg of  elemental calcium total) by mouth 3 (three) times daily with meals. 90 tablet 6   cholestyramine (QUESTRAN) 4 GM/DOSE powder DISSOLVE AND TAKE 2 GRAMS BY MOUTH TWICE DAILY WITH A MEAL. DO NOT TAKE WITHIN 2 HOURS OF OTHER MEDICATIONS. 378 g 0   clotrimazole (CLOTRIMAZOLE AF) 1 % cream Apply 1 application topically 2 (two) times daily. To affected area for 1-2 weeks 30 g 0   cyclobenzaprine (FLEXERIL) 10 MG tablet Take 10 mg by mouth as needed for muscle spasms.      dexlansoprazole (DEXILANT) 60 MG capsule 1 PO EVERY MORNING WITH BREAKFAST. 90 capsule  3   diazepam (VALIUM) 5 MG tablet Take 5 mg by mouth every 12 (twelve) hours as needed for anxiety.     fluticasone (FLONASE) 50 MCG/ACT nasal spray USE TWO SPRAY(S) IN EACH NOSTRIL ONCE DAILY AS NEEDED FOR ALLERGY OR RHINITIS (Patient taking differently: Place 2 sprays into both nostrils daily as needed for allergies or rhinitis.) 16 g 2   gabapentin (NEURONTIN) 400 MG capsule Take 1 capsule (400 mg total) by mouth 4 (four) times daily. (Patient taking differently: Take 400 mg by mouth 3 (three) times daily.)     lactase (LACTAID) 3000 units tablet Take 3,000 Units by mouth daily as needed (lactose intolerance).     magnesium hydroxide (MILK OF MAGNESIA) 400 MG/5ML suspension Take 30 mLs by mouth daily as needed for mild constipation.     SYMBICORT 80-4.5 MCG/ACT inhaler Inhale 2 puffs into the lungs 2 (two) times daily. (Patient taking differently: Inhale 2 puffs into the lungs. 1-2 times per day) 11 g 11   triamcinolone (KENALOG) 0.1 % Apply 1 application topically 2 (two) times daily. 30 g 0   UNABLE TO FIND as needed. Penetrex cream     hyoscyamine (LEVSIN SL) 0.125 MG SL tablet Place 1 tablet (0.125 mg total) under the tongue every 6 (six) hours as needed. For abdominal cramping (Patient not taking: Reported on 12/07/2020) 30 tablet 0   Lidocaine HCl 4 % LIQD Apply 1 application topically 3 (three) times daily as needed (pain). (Patient not taking:  Reported on 12/07/2020)     loratadine (CLARITIN) 10 MG tablet Take 1 tablet (10 mg total) by mouth daily. (Patient not taking: Reported on 12/07/2020) 30 tablet 3   Menthol 10 % AERO Apply 1 application topically 3 (three) times daily as needed (pain). (Patient not taking: Reported on 12/07/2020)     nystatin (MYCOSTATIN) 100000 UNIT/ML suspension TAKE 5 ML BY MOUTH  4 TIMES DAILY AS NEEDED FOR THRUSH (Patient not taking: Reported on 12/07/2020) 500 mL 0   oxyCODONE-acetaminophen (PERCOCET) 10-325 MG tablet Take 1 tablet by mouth every 6 (six) hours as needed for pain.  (Patient not taking: Reported on 12/07/2020)     UNABLE TO FIND Capsule to rebuild cells-BID (Patient not taking: Reported on 12/07/2020)     valACYclovir (VALTREX) 1000 MG tablet Take 1 tablet (1,000 mg total) by mouth 2 (two) times daily. (Patient not taking: Reported on 12/07/2020) 14 tablet 0   No current facility-administered medications for this visit.    Allergies as of 12/07/2020 - Review Complete 12/07/2020  Allergen Reaction Noted   Naproxen Shortness Of Breath 01/09/2016   Eggs or egg-derived products Diarrhea 02/20/2018   Lovastatin Other (See Comments) 09/25/2010   Toradol [ketorolac tromethamine] Other (See Comments) 02/20/2016   Tramadol Other (See Comments) 02/20/2016   Nexium [esomeprazole magnesium] Diarrhea 06/12/2012    Family History  Problem Relation Age of Onset   Hypothyroidism Mother    Heart disease Father    Parkinson's disease Father    Hypothyroidism Daughter    Heart attack Paternal Grandfather    Alzheimer's disease Paternal Grandmother    Heart attack Maternal Grandfather    Crohn's disease Sister    Other Sister        was murdered   Crohn's disease Maternal Uncle    Colon cancer Neg Hx    Colon polyps Neg Hx     Social History   Socioeconomic History   Marital status: Single    Spouse name: [place]  Number of children: 2   Years of education: GED   Highest education level: Not on file   Occupational History    Comment: disabled  Tobacco Use   Smoking status: Every Day    Packs/day: 0.25    Years: 30.00    Pack years: 7.50    Types: Cigarettes   Smokeless tobacco: Never  Vaping Use   Vaping Use: Never used  Substance and Sexual Activity   Alcohol use: Not Currently    Comment: wine/beer a few times a week    Drug use: No   Sexual activity: Not Currently    Birth control/protection: Post-menopausal  Other Topics Concern   Not on file  Social History Narrative   Lives with husband   no caffiene   Social Determinants of Health   Financial Resource Strain: Not on file  Food Insecurity: Not on file  Transportation Needs: Not on file  Physical Activity: Not on file  Stress: Not on file  Social Connections: Not on file    Subjective: Review of Systems  Constitutional:  Negative for chills and fever.  HENT:  Negative for congestion and hearing loss.   Eyes:  Negative for blurred vision and double vision.  Respiratory:  Negative for cough and shortness of breath.   Cardiovascular:  Negative for chest pain and palpitations.  Gastrointestinal:  Positive for abdominal pain and diarrhea. Negative for blood in stool, constipation, heartburn, melena and vomiting.  Genitourinary:  Negative for dysuria and urgency.  Musculoskeletal:  Negative for joint pain and myalgias.  Skin:  Negative for itching and rash.  Neurological:  Negative for dizziness and headaches.  Psychiatric/Behavioral:  Negative for depression. The patient is not nervous/anxious.     Objective: BP (!) 161/101   Pulse 98   Temp 97.8 F (36.6 C)   Ht 5' (1.524 m)   Wt 108 lb 6.4 oz (49.2 kg)   BMI 21.17 kg/m  Physical Exam Constitutional:      Appearance: Normal appearance.  HENT:     Head: Normocephalic and atraumatic.  Eyes:     Extraocular Movements: Extraocular movements intact.     Conjunctiva/sclera: Conjunctivae normal.  Cardiovascular:     Rate and Rhythm: Normal rate and  regular rhythm.  Pulmonary:     Effort: Pulmonary effort is normal.     Breath sounds: Normal breath sounds.  Abdominal:     General: Bowel sounds are normal.     Palpations: Abdomen is soft.  Musculoskeletal:        General: No swelling. Normal range of motion.     Cervical back: Normal range of motion and neck supple.  Skin:    General: Skin is warm and dry.     Coloration: Skin is not jaundiced.  Neurological:     General: No focal deficit present.     Mental Status: She is alert and oriented to person, place, and time.  Psychiatric:        Mood and Affect: Mood normal.        Behavior: Behavior normal.     Assessment: *Abdominal pain-lower, worsening *Diarrhea. *GERD *Crohn's disease status post ileal cecal resection-not on chronic management  Plan: Patient with numerous GI complaints for me today.  Primarily worsening abdominal pain and diarrhea.  Most recent CRP in June normal.  Not on chronic management for her Crohn's disease.  Did recently stop taking chronic pain medications with Percocet.  We will order CT abdomen pelvis with contrast to further  evaluate.  She may need colonoscopy pending CT findings.  I will send in dicyclomine to take 4 times daily as needed.  Previously on Levsin but states this hurt her throat as it was a sublingual medication.  Discussed that I do not prescribe opioids.  If she wishes to go back on chronic pain management she will need to discuss further with her pain doctor.  Further recommendations after CT imaging reviewed.  12/07/2020 10:21 AM   Disclaimer: This note was dictated with voice recognition software. Similar sounding words can inadvertently be transcribed and may not be corrected upon review.

## 2020-12-07 NOTE — Patient Instructions (Signed)
For your worsening abdominal pain, we will order CT abdomen pelvis as soon as possible to further evaluate.  I am going to send you in a new medication called dicyclomine.  You can take this up to 4 times daily.  This should help with pain and diarrhea.  Do not take Levsin (hyoscyamine) with this medication.  Further recommendations after CT reviewed

## 2020-12-08 ENCOUNTER — Ambulatory Visit (HOSPITAL_COMMUNITY)
Admission: RE | Admit: 2020-12-08 | Discharge: 2020-12-08 | Disposition: A | Discharge status: Home / Self Care | Payer: Medicare Other | Source: Ambulatory Visit | Attending: Internal Medicine | Admitting: Internal Medicine

## 2020-12-08 DIAGNOSIS — K760 Fatty (change of) liver, not elsewhere classified: Secondary | ICD-10-CM | POA: Diagnosis not present

## 2020-12-08 DIAGNOSIS — K50019 Crohn's disease of small intestine with unspecified complications: Secondary | ICD-10-CM | POA: Diagnosis not present

## 2020-12-08 DIAGNOSIS — R197 Diarrhea, unspecified: Secondary | ICD-10-CM

## 2020-12-08 DIAGNOSIS — R1032 Left lower quadrant pain: Secondary | ICD-10-CM | POA: Diagnosis not present

## 2020-12-08 LAB — POCT I-STAT CREATININE: Creatinine, Ser: 0.6 mg/dL (ref 0.44–1.00)

## 2020-12-08 MED ORDER — IOHEXOL 300 MG/ML  SOLN
75.0000 mL | Freq: Once | INTRAMUSCULAR | Status: AC | PRN
  Administered 2020-12-08: 75 mL via INTRAVENOUS

## 2020-12-11 ENCOUNTER — Telehealth (INDEPENDENT_AMBULATORY_CARE_PROVIDER_SITE_OTHER): Payer: Self-pay | Admitting: *Deleted

## 2020-12-11 NOTE — Telephone Encounter (Signed)
Voice Mail  11:27   Having issues with medication   Dicyclomine 10 mg 4 times daily  She says making her dizzy and she cannot see well   Dr. Abbey Chatters is her md

## 2020-12-12 ENCOUNTER — Ambulatory Visit: Payer: Medicare Other | Admitting: Gastroenterology

## 2020-12-13 ENCOUNTER — Other Ambulatory Visit: Payer: Self-pay

## 2020-12-13 ENCOUNTER — Encounter: Payer: Self-pay | Admitting: Internal Medicine

## 2020-12-13 ENCOUNTER — Ambulatory Visit (INDEPENDENT_AMBULATORY_CARE_PROVIDER_SITE_OTHER): Payer: Medicare Other | Admitting: Internal Medicine

## 2020-12-13 VITALS — BP 134/86 | HR 90 | Temp 98.3°F | Resp 18 | Ht 59.0 in | Wt 111.0 lb

## 2020-12-13 DIAGNOSIS — G894 Chronic pain syndrome: Secondary | ICD-10-CM

## 2020-12-13 DIAGNOSIS — E039 Hypothyroidism, unspecified: Secondary | ICD-10-CM

## 2020-12-13 DIAGNOSIS — K50019 Crohn's disease of small intestine with unspecified complications: Secondary | ICD-10-CM | POA: Diagnosis not present

## 2020-12-13 DIAGNOSIS — I10 Essential (primary) hypertension: Secondary | ICD-10-CM

## 2020-12-13 DIAGNOSIS — K589 Irritable bowel syndrome without diarrhea: Secondary | ICD-10-CM | POA: Diagnosis not present

## 2020-12-13 DIAGNOSIS — Z79891 Long term (current) use of opiate analgesic: Secondary | ICD-10-CM | POA: Diagnosis not present

## 2020-12-13 DIAGNOSIS — M542 Cervicalgia: Secondary | ICD-10-CM | POA: Diagnosis not present

## 2020-12-13 DIAGNOSIS — Z114 Encounter for screening for human immunodeficiency virus [HIV]: Secondary | ICD-10-CM

## 2020-12-13 DIAGNOSIS — M961 Postlaminectomy syndrome, not elsewhere classified: Secondary | ICD-10-CM | POA: Diagnosis not present

## 2020-12-13 HISTORY — DX: Hypothyroidism, unspecified: E03.9

## 2020-12-13 MED ORDER — LEVOTHYROXINE SODIUM 25 MCG PO TABS: 25.0000 ug | ORAL_TABLET | Freq: Every day | ORAL | 2 refills | Status: DC

## 2020-12-13 NOTE — Assessment & Plan Note (Signed)
S/p partial bowel resection F/u with GI Has tried multiple medications in the past Was recently given Bentyl, but stopped taking it as she was having vision difficulty with it. Needs to contact GI. On chronic pain medications - on Cymbalta now

## 2020-12-13 NOTE — Assessment & Plan Note (Signed)
From chronic back pain and IBD On Gabapentin and Percocet, tapering Percocet, now on Cymbalta F/u with Dr Merlene Laughter

## 2020-12-13 NOTE — Assessment & Plan Note (Signed)
TSH: 13.61 from outside source Started Levothyroxine 25 mcg QD Check TSH and free T4 before next visit

## 2020-12-13 NOTE — Patient Instructions (Signed)
Please start taking Levothyroxine.  Please get fasting blood tests done before the next visit.  Please check with your GI Physician for Crohn's disease.

## 2020-12-13 NOTE — Assessment & Plan Note (Signed)
BP Readings from Last 1 Encounters:  12/13/20 134/86   Well-controlled with Amlodipine 5 mg QD Counseled for compliance with the medications Advised DASH diet and moderate exercise/walking, at least 150 mins/week

## 2020-12-13 NOTE — Progress Notes (Signed)
Established Patient Office Visit  Subjective:  Patient ID: [name] [name], female    DOB: [birth year]  Age: 65 y.o. MRN: [id number]  CC:  Chief Complaint  Patient presents with   Follow-up    4 month follow up has chrons and ibs and was taken off of pain meds and was put on dicyclomine but she cannot take this bp has been high and thyroid was low     HPI [name] [name]  is 65 year old female with past medical history of hypertension, Crohn's disease status post partial bowel resection, GERD, DJD of cervical and lumbar spine, s/p lumbar fusion surgery, chronic pain syndrome and tobacco abuse who presents for follow up of her chronic medical conditions.  BP is well-controlled. Takes medications regularly. Patient denies headache, dizziness, chest pain, dyspnea or palpitations.   She has history of Crohn's disease, but she is not on immunosuppressives. She has tried different regimens in the past. She was given Bentyl recently, but did not tolerate it. She follows up with [facility] gastroenterology. She denies any melena or hematochezia currently, but has chronic abdominal pain.   She has a history of chronic pain syndrome, for which she follows up with Dr. Merlene Laughter.  She is on gabapentin and Percocet as needed. She is going to start Cymbalta from today.  She has multiple blood tests and screening tests done with [facility]. Her TSH was elevated. She has chronic fatigue, but denies any tremors, palpitations. She reports weight gain, but denies any recent change in appetite.  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   Crohn disease ([place] [address])    CTS (carpal tunnel syndrome)    Diarrhea MULITIFACTORIAL   IBS, LACTOSE INTOLERANCE, SBBO, BILE-SALT   Elevated liver enzymes 2009: BMI 24 ?Etoh ALT 94, AST  51,  NEG ANA, qIGs& ASMA   MAY 2012 AST 52 ALT 38   Esophageal stricture 2009   GERD (gastroesophageal reflux disease)    Hemorrhoid    Hyperlipemia    Hypertension    Inflammatory bowel disease 2006 CD   SURGICAL REMISSION   LBP (low back pain)    Mental disorder    Migraine    Osteoporosis    PONV (postoperative nausea and vomiting)    Shortness of breath    exertion and humidity   Shoulder pain    right   Sleep apnea    Stop Bang Cock score of 4    Past Surgical History:  Procedure Laterality Date   BACK SURGERY     BACK SURGERY  07/16/2018   BILATERAL SALPINGOOPHORECTOMY     BIOPSY  12/24/2011   Procedure: BIOPSY;  Surgeon: Danie Binder, MD;  Location: [facility];  Service: Endoscopy;;  Gastric Biopsies   BIOPSY N/A 06/30/2012   Procedure: BIOPSY;  Surgeon: Danie Binder, MD;  Location: [facility];  Service: Endoscopy;  Laterality: N/A;  Gastric and Esophageal Biopsies   CARPAL TUNNEL RELEASE  LEFT   CATARACT EXTRACTION W/PHACO Right 07/30/2016   Procedure: CATARACT EXTRACTION PHACO AND INTRAOCULAR LENS PLACEMENT ([place]);  Surgeon: Rutherford Guys, MD;  Location: [facility];  Service: Ophthalmology;  Laterality: Right;  CDE: 5.45   CATARACT EXTRACTION W/PHACO Left 08/13/2016   Procedure:  CATARACT EXTRACTION PHACO AND INTRAOCULAR LENS PLACEMENT (I[facility]);  Surgeon: Rutherford Guys, MD;  Location: [facility];  Service: Ophthalmology;  Laterality: Left;  CDE: 5.59   COLON SURGERY     COLONOSCOPY  JAN 2009 DIARRHEA, ABD PAIN, BRBPR   ANASTOMOTIC ULCER(3MM), IH XL:KGMWNU ULCER, NL COLON bX   COLONOSCOPY WITH PROPOFOL N/A 04/21/2018   examined portion of the ileum normal, two 3-5 mm polyps, diverticulosis in the rectosigmoid and sigmoid colon, external and internal hemorrhoids, significant looping of the colon.  Pathology with tubular adenomas.  Recommendations to follow high-fiber diet and repeat colonoscopy in 5 to 10 years with peds colonoscope.   DILATION AND CURETTAGE OF UTERUS     ESOPHAGEAL  DILATION  12/24/2011   SLF: A stricture was found in the distal esophagus/ Moderate gastritis/ MILD Duodenitis   ESOPHAGOGASTRODUODENOSCOPY  02/07/09   mild gastritis   ESOPHAGOGASTRODUODENOSCOPY (EGD) WITH PROPOFOL N/A 06/30/2012   SLF: 1. No definite stricture appreciated 2. small hiatal hernia 3. Gastritis   ESOPHAGOGASTRODUODENOSCOPY (EGD) WITH PROPOFOL N/A 02/20/2016   Procedure: ESOPHAGOGASTRODUODENOSCOPY (EGD) WITH PROPOFOL;  Surgeon: Danie Binder, MD;  [facility];  Service: Endoscopy;  Laterality: N/A;  930   ESOPHAGOGASTRODUODENOSCOPY (EGD) WITH PROPOFOL N/A 11/24/2018   benign-appearing esophageal peptic stricture s/p dilation, small hiatal hernia, moderate erosive gastritis.    EYE SURGERY     gum flap Bilateral    HEMICOLECTOMY  RIGHT 2006   HERNIA REPAIR     LUMBAR DISC SURGERY     POLYPECTOMY  04/21/2018   Procedure: POLYPECTOMY;  Surgeon: Danie Binder, MD;  [facility];  Service: Endoscopy;;  (colon)   SAVORY DILATION N/A 06/30/2012   Procedure: SAVORY DILATION;  Surgeon: Danie Binder, MD;  Location: [facility];  Service: Endoscopy;  Laterality: N/A;  12.37m , 153m 1558m SAVORY DILATION N/A 02/20/2016   Procedure: SAVORY DILATION;  Surgeon: SanDanie BinderD;  [facility];  Service: Endoscopy;  Laterality: N/A;   SAVORY DILATION N/A 11/24/2018   Procedure: SAVORY DILATION;  Surgeon: FieDanie BinderD;  [facility];  Service: Endoscopy;  Laterality: N/A;   UPPER GASTROINTESTINAL ENDOSCOPY  JAN 2009 ABD PAIN   GASTRITIS, ESO RING   UPPER GASTROINTESTINAL ENDOSCOPY  OCT 2010 ABD PAIN, DYSPHAGIA   DIL 15MM, GASTRITIS, NL DUODENUM   UPPER GASTROINTESTINAL ENDOSCOPY  OCT 2010 DYSPHAGIA   DIL 17 MM, GASTRITIS, NL DUODENUM   vocal cord surgery  Sep 20, 2010   precancerous areas removed   wisdom tooth extraction Right    pin placement due to broken jaw    Family History  Problem Relation Age of Onset   Hypothyroidism Mother     Heart disease Father    Parkinson's disease Father    Hypothyroidism Daughter    Heart attack Paternal Grandfather    Alzheimer's disease Paternal Grandmother    Heart attack Maternal Grandfather    Crohn's disease Sister    Other Sister        was murdered   Crohn's disease Maternal Uncle    Colon cancer Neg Hx    Colon polyps Neg Hx     Social History   Socioeconomic History   Marital status: Single    Spouse name: WayPatrick JupiterNumber of children: 2   Years of education: GED   Highest education level: Not on file  Occupational History    Comment: disabled  Tobacco Use   Smoking  status: Every Day    Packs/day: 0.25    Years: 30.00    Pack years: 7.50    Types: Cigarettes   Smokeless tobacco: Never  Vaping Use   Vaping Use: Never used  Substance and Sexual Activity   Alcohol use: Not Currently    Comment: wine/beer a few times a week    Drug use: No   Sexual activity: Not Currently    Birth control/protection: Post-menopausal  Other Topics Concern   Not on file  Social History Narrative   Lives with husband   no caffiene   Social Determinants of Health   Financial Resource Strain: Not on file  Food Insecurity: Not on file  Transportation Needs: Not on file  Physical Activity: Not on file  Stress: Not on file  Social Connections: Not on file  Intimate Partner Violence: Not on file    Outpatient Medications Prior to Visit  Medication Sig Dispense Refill   albuterol (VENTOLIN HFA) 108 (90 Base) MCG/ACT inhaler Inhale 2 puffs into the lungs every 6 (six) hours as needed for wheezing or shortness of breath. 18 g 11   amLODipine (NORVASC) 5 MG tablet Take 1 tablet by mouth once daily 90 tablet 0   benzonatate (TESSALON) 100 MG capsule TAKE 1 CAPSULE BY MOUTH TWICE DAILY AS NEEDED FOR COUGH 30 capsule 0   calcium carbonate (TUMS - DOSED IN MG ELEMENTAL CALCIUM) 500 MG chewable tablet Chew 1 tablet (200 mg of elemental calcium total) by mouth 3 (three) times daily  with meals. 90 tablet 6   cholestyramine (QUESTRAN) 4 GM/DOSE powder DISSOLVE AND TAKE 2 GRAMS BY MOUTH TWICE DAILY WITH A MEAL. DO NOT TAKE WITHIN 2 HOURS OF OTHER MEDICATIONS. 378 g 0   clotrimazole (CLOTRIMAZOLE AF) 1 % cream Apply 1 application topically 2 (two) times daily. To affected area for 1-2 weeks 30 g 0   cyclobenzaprine (FLEXERIL) 10 MG tablet Take 10 mg by mouth as needed for muscle spasms.      dexlansoprazole (DEXILANT) 60 MG capsule 1 PO EVERY MORNING WITH BREAKFAST. 90 capsule 3   diazepam (VALIUM) 5 MG tablet Take 5 mg by mouth every 12 (twelve) hours as needed for anxiety.     DULoxetine (CYMBALTA) 30 MG capsule Take 30 mg by mouth 2 (two) times daily.     fluticasone (FLONASE) 50 MCG/ACT nasal spray USE TWO SPRAY(S) IN EACH NOSTRIL ONCE DAILY AS NEEDED FOR ALLERGY OR RHINITIS (Patient taking differently: Place 2 sprays into both nostrils daily as needed for allergies or rhinitis.) 16 g 2   gabapentin (NEURONTIN) 400 MG capsule Take 1 capsule (400 mg total) by mouth 4 (four) times daily. (Patient taking differently: Take 400 mg by mouth 3 (three) times daily.)     lactase (LACTAID) 3000 units tablet Take 3,000 Units by mouth daily as needed (lactose intolerance).     Lidocaine HCl 4 % LIQD Apply 1 application topically 3 (three) times daily as needed (pain).     loratadine (CLARITIN) 10 MG tablet Take 1 tablet (10 mg total) by mouth daily. 30 tablet 3   magnesium hydroxide (MILK OF MAGNESIA) 400 MG/5ML suspension Take 30 mLs by mouth daily as needed for mild constipation.     Menthol 10 % AERO Apply 1 application topically 3 (three) times daily as needed (pain).     nystatin (MYCOSTATIN) 100000 UNIT/ML suspension TAKE 5 ML BY MOUTH  4 TIMES DAILY AS NEEDED FOR THRUSH (Patient taking differently: TAKE 5 ML BY  MOUTH  4 TIMES DAILY AS NEEDED FOR THRUSH) 500 mL 0   SYMBICORT 80-4.5 MCG/ACT inhaler Inhale 2 puffs into the lungs 2 (two) times daily. (Patient taking differently: Inhale 2  puffs into the lungs. 1-2 times per day) 11 g 11   triamcinolone (KENALOG) 0.1 % Apply 1 application topically 2 (two) times daily. 30 g 0   UNABLE TO FIND Capsule to rebuild cells-BID     UNABLE TO FIND as needed. Penetrex cream     valACYclovir (VALTREX) 1000 MG tablet Take 1 tablet (1,000 mg total) by mouth 2 (two) times daily. 14 tablet 0   dicyclomine (BENTYL) 10 MG capsule Take 1 capsule (10 mg total) by mouth 4 (four) times daily. (Patient not taking: Reported on 12/13/2020) 90 capsule 1   oxyCODONE-acetaminophen (PERCOCET) 10-325 MG tablet Take 1 tablet by mouth every 6 (six) hours as needed for pain. (Patient not taking: Reported on 12/13/2020)     No facility-administered medications prior to visit.    Allergies  Allergen Reactions   Naproxen Shortness Of Breath    Chest pain   Eggs Or Egg-Derived Products Diarrhea    abd pain   Lovastatin Other (See Comments)    Chest pain   Toradol [Ketorolac Tromethamine] Other (See Comments)    Headache. Non-effective   Tramadol Other (See Comments)    Headache. Non-effective.   Nexium [Esomeprazole Magnesium] Diarrhea    Abdominal pain    ROS Review of Systems  Constitutional:  Negative for chills and fever.  HENT:  Negative for congestion, sinus pressure, sinus pain and sore throat.   Eyes:  Negative for pain and discharge.  Respiratory:  Negative for cough and shortness of breath.   Cardiovascular:  Negative for chest pain and palpitations.  Gastrointestinal:  Positive for abdominal pain (chronic). Negative for constipation, diarrhea, nausea and vomiting.  Endocrine: Negative for polydipsia and polyuria.  Genitourinary:  Negative for dysuria and hematuria.  Musculoskeletal:  Positive for arthralgias, back pain and neck pain. Negative for neck stiffness.  Skin:  Negative for rash.  Neurological:  Negative for dizziness and weakness.  Psychiatric/Behavioral:  Negative for agitation and behavioral problems. The patient is  nervous/anxious.      Objective:    Physical Exam Vitals reviewed.  Constitutional:      General: She is not in acute distress.    Appearance: She is not diaphoretic.  HENT:     Head: Normocephalic and atraumatic.     Nose: Nose normal.     Mouth/Throat:     Mouth: Mucous membranes are moist.  Eyes:     General: No scleral icterus.    Extraocular Movements: Extraocular movements intact.     Pupils: Pupils are equal, round, and reactive to light.  Cardiovascular:     Rate and Rhythm: Normal rate and regular rhythm.     Pulses: Normal pulses.     Heart sounds: No murmur heard. Pulmonary:     Breath sounds: Normal breath sounds. No wheezing or rales.  Abdominal:     Palpations: Abdomen is soft.     Tenderness: There is no abdominal tenderness.  Musculoskeletal:     Cervical back: Neck supple. No tenderness.     Right lower leg: No edema.     Left lower leg: No edema.  Skin:    General: Skin is warm.     Findings: Bruising (Right cubital fossa due to recent blood draw) present.  Neurological:     General: No focal  deficit present.     Mental Status: She is alert and oriented to person, place, and time.  Psychiatric:        Mood and Affect: Mood normal.        Behavior: Behavior normal.    BP 134/86 (BP Location: Left Arm, Patient Position: Sitting, Cuff Size: Normal)   Pulse 90   Temp 98.3 F (36.8 C) (Oral)   Resp 18   Ht 4' 11"  (1.499 m)   Wt 111 lb (50.3 kg)   SpO2 98%   BMI 22.42 kg/m  Wt Readings from Last 3 Encounters:  12/13/20 111 lb (50.3 kg)  12/07/20 108 lb 6.4 oz (49.2 kg)  07/28/20 115 lb 1.9 oz (52.2 kg)     Health Maintenance Due  Topic Date Due   COVID-19 Vaccine (1) Never done   Pneumococcal Vaccine 50-63 Years old (3 - PCV) 08/07/2016   PAP SMEAR-Modifier  04/02/2020   INFLUENZA VACCINE  12/04/2020    There are no preventive care reminders to display for this patient.  Lab Results  Component Value Date   TSH 1.010 12/27/2011   Lab  Results  Component Value Date   WBC 2.7 (L) 01/31/2020   HGB 15.1 (H) 01/31/2020   HCT 43.3 01/31/2020   MCV 104.6 (H) 01/31/2020   PLT 175 01/31/2020   Lab Results  Component Value Date   NA 138 01/31/2020   K 3.7 01/31/2020   CO2 26 01/31/2020   GLUCOSE 80 01/31/2020   BUN 5 (L) 01/31/2020   CREATININE 0.60 12/08/2020   BILITOT 0.6 01/31/2020   ALKPHOS 120 01/31/2020   AST 47 (H) 01/31/2020   ALT 33 01/31/2020   PROT 6.9 01/31/2020   ALBUMIN 3.9 01/31/2020   CALCIUM 8.8 (L) 01/31/2020   ANIONGAP 12 01/31/2020   Lab Results  Component Value Date   CHOL 271 (H) 01/31/2020   Lab Results  Component Value Date   HDL >135 01/31/2020   Lab Results  Component Value Date   LDLCALC NOT CALCULATED 01/31/2020   Lab Results  Component Value Date   TRIG 73 01/31/2020   Lab Results  Component Value Date   CHOLHDL NOT CALCULATED 01/31/2020   No results found for: HGBA1C    Assessment & Plan:   Problem List Items Addressed This Visit       Cardiovascular and Mediastinum   Essential hypertension    BP Readings from Last 1 Encounters:  12/13/20 134/86  Well-controlled with Amlodipine 5 mg QD Counseled for compliance with the medications Advised DASH diet and moderate exercise/walking, at least 150 mins/week       Relevant Orders   CBC with Differential/Platelet   CMP14+EGFR   Lipid panel     Digestive   Crohn's disease of small intestine with complication ([place])    S/p partial bowel resection F/u with GI Has tried multiple medications in the past Was recently given Bentyl, but stopped taking it as she was having vision difficulty with it. Needs to contact GI. On chronic pain medications - on Cymbalta now         Endocrine   Hypothyroidism - Primary    TSH: 13.61 from outside source Started Levothyroxine 25 mcg QD Check TSH and free T4 before next visit       Relevant Medications   levothyroxine (SYNTHROID) 25 MCG tablet   Other Relevant Orders    TSH + free T4     Other   Chronic pain syndrome  From chronic back pain and IBD On Gabapentin and Percocet, tapering Percocet, now on Cymbalta F/u with Dr Merlene Laughter       Relevant Medications   DULoxetine (CYMBALTA) 30 MG capsule   Other Visit Diagnoses     Encounter for screening for HIV       Relevant Orders   HIV antibody (with reflex)       Meds ordered this encounter  Medications   levothyroxine (SYNTHROID) 25 MCG tablet    Sig: Take 1 tablet (25 mcg total) by mouth daily.    Dispense:  30 tablet    Refill:  2    Follow-up: Return in about 2 months (around 02/12/2021) for Hypothyroidism.    Lindell Spar, MD

## 2020-12-13 NOTE — Telephone Encounter (Signed)
Returned the pt's call states she cannot take the Dicyclomine because it makes her dizzy and she states it bothers her sight. Pt also states that the medication have her where she cannot speak. Please advise

## 2020-12-25 ENCOUNTER — Other Ambulatory Visit: Payer: Self-pay | Admitting: Internal Medicine

## 2020-12-25 DIAGNOSIS — B370 Candidal stomatitis: Secondary | ICD-10-CM

## 2020-12-25 MED ORDER — NYSTATIN 100000 UNIT/ML MT SUSP: OROMUCOSAL | 0 refills | Status: DC

## 2021-01-05 ENCOUNTER — Telehealth: Payer: Self-pay | Admitting: Internal Medicine

## 2021-01-05 NOTE — Telephone Encounter (Signed)
No answer unable to leave a message for patient to call back and schedule Medicare Annual Wellness Visit (AWV) in office.   If unable to come into the office for AWV,  please offer to do virtually or by telephone.  Last AWV: 06/25/2017  Please schedule at anytime with Van.  40 minute appointment  Any questions, please contact me at [phone]

## 2021-01-13 ENCOUNTER — Other Ambulatory Visit: Payer: Self-pay

## 2021-01-13 ENCOUNTER — Ambulatory Visit (INDEPENDENT_AMBULATORY_CARE_PROVIDER_SITE_OTHER): Payer: Medicare Other | Admitting: *Deleted

## 2021-01-13 DIAGNOSIS — Z0000 Encounter for general adult medical examination without abnormal findings: Secondary | ICD-10-CM

## 2021-01-13 NOTE — Progress Notes (Signed)
Subjective:   [name] [name] is a 65 y.o. female who presents for Medicare Annual (Subsequent) preventive examination.  Review of Systems    ***       Objective:    There were no vitals filed for this visit. There is no height or weight on file to calculate BMI.  Advanced Directives 11/24/2018 07/16/2018 07/09/2018 04/21/2018 04/14/2018 02/19/2017 08/13/2016  Does Patient Have a Medical Advance Directive? No No No No No Yes No  Would patient like information on creating a medical advance directive? No - Patient declined No - Patient declined No - Patient declined No - Patient declined No - Patient declined - No - Patient declined  Pre-existing out of facility DNR order (yellow form or pink MOST form) - - - - - - -    Current Medications (verified) Outpatient Encounter Medications as of 01/13/2021  Medication Sig  . albuterol (VENTOLIN HFA) 108 (90 Base) MCG/ACT inhaler Inhale 2 puffs into the lungs every 6 (six) hours as needed for wheezing or shortness of breath.  Marland Kitchen amLODipine (NORVASC) 5 MG tablet Take 1 tablet by mouth once daily  . benzonatate (TESSALON) 100 MG capsule TAKE 1 CAPSULE BY MOUTH TWICE DAILY AS NEEDED FOR COUGH  . calcium carbonate (TUMS - DOSED IN MG ELEMENTAL CALCIUM) 500 MG chewable tablet Chew 1 tablet (200 mg of elemental calcium total) by mouth 3 (three) times daily with meals.  . cholestyramine (QUESTRAN) 4 GM/DOSE powder DISSOLVE AND TAKE 2 GRAMS BY MOUTH TWICE DAILY WITH A MEAL. DO NOT TAKE WITHIN 2 HOURS OF OTHER MEDICATIONS.  Marland Kitchen clotrimazole (CLOTRIMAZOLE AF) 1 % cream Apply 1 application topically 2 (two) times daily. To affected area for 1-2 weeks  . cyclobenzaprine (FLEXERIL) 10 MG tablet Take 10 mg by mouth as needed for muscle spasms.   Marland Kitchen dexlansoprazole (DEXILANT) 60 MG capsule 1 PO EVERY MORNING WITH BREAKFAST.  . diazepam (VALIUM) 5 MG tablet Take 5 mg by mouth every 12 (twelve) hours as needed for anxiety.  . dicyclomine (BENTYL) 10 MG capsule Take 1  capsule (10 mg total) by mouth 4 (four) times daily. (Patient not taking: Reported on 12/13/2020)  . DULoxetine (CYMBALTA) 30 MG capsule Take 30 mg by mouth 2 (two) times daily.  . fluticasone (FLONASE) 50 MCG/ACT nasal spray USE TWO SPRAY(S) IN EACH NOSTRIL ONCE DAILY AS NEEDED FOR ALLERGY OR RHINITIS (Patient taking differently: Place 2 sprays into both nostrils daily as needed for allergies or rhinitis.)  . gabapentin (NEURONTIN) 400 MG capsule Take 1 capsule (400 mg total) by mouth 4 (four) times daily. (Patient taking differently: Take 400 mg by mouth 3 (three) times daily.)  . lactase (LACTAID) 3000 units tablet Take 3,000 Units by mouth daily as needed (lactose intolerance).  Marland Kitchen levothyroxine (SYNTHROID) 25 MCG tablet Take 1 tablet (25 mcg total) by mouth daily.  . Lidocaine HCl 4 % LIQD Apply 1 application topically 3 (three) times daily as needed (pain).  Marland Kitchen loratadine (CLARITIN) 10 MG tablet Take 1 tablet (10 mg total) by mouth daily.  . magnesium hydroxide (MILK OF MAGNESIA) 400 MG/5ML suspension Take 30 mLs by mouth daily as needed for mild constipation.  . Menthol 10 % AERO Apply 1 application topically 3 (three) times daily as needed (pain).  . nystatin (MYCOSTATIN) 100000 UNIT/ML suspension TAKE 5 ML BY MOUTH  4 TIMES DAILY AS NEEDED FOR THRUSH  . oxyCODONE-acetaminophen (PERCOCET) 10-325 MG tablet Take 1 tablet by mouth every 6 (six) hours as needed  for pain. (Patient not taking: Reported on 12/13/2020)  . SYMBICORT 80-4.5 MCG/ACT inhaler Inhale 2 puffs into the lungs 2 (two) times daily. (Patient taking differently: Inhale 2 puffs into the lungs. 1-2 times per day)  . triamcinolone (KENALOG) 0.1 % Apply 1 application topically 2 (two) times daily.  Marland Kitchen UNABLE TO FIND Capsule to rebuild cells-BID  . UNABLE TO FIND as needed. Penetrex cream  . valACYclovir (VALTREX) 1000 MG tablet Take 1 tablet (1,000 mg total) by mouth 2 (two) times daily.   No facility-administered encounter medications  on file as of 01/13/2021.    Allergies (verified) Naproxen, Eggs or egg-derived products, Lovastatin, Toradol [ketorolac tromethamine], Tramadol, and Nexium [esomeprazole magnesium]   History: Past Medical History:  Diagnosis Date  . Allergic rhinitis   . Anastomotic ulcer JAN 2009  . Anxiety   . Arthritis   . Asthma   . BMI (body mass index) 20.0-29.9 2009 121 lbs  . Concussion   . 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 Crohn disease ([place])   . CTS (carpal tunnel syndrome)   . Diarrhea MULITIFACTORIAL   IBS, LACTOSE INTOLERANCE, SBBO, BILE-SALT  . Elevated liver enzymes 2009: BMI 24 ?Etoh ALT 94, AST 51,  NEG ANA, qIGs& ASMA   MAY 2012 AST 52 ALT 38  . Esophageal stricture 2009  . GERD (gastroesophageal reflux disease)   . Hemorrhoid   . Hyperlipemia   . Hypertension   . Inflammatory bowel disease 2006 CD   SURGICAL REMISSION  . LBP (low back pain)   . Mental disorder   . Migraine   . Osteoporosis   . PONV (postoperative nausea and vomiting)   . Shortness of breath    exertion and humidity  . Shoulder pain    right  . Sleep apnea    Stop [name]   Past Surgical History:  Procedure Laterality Date  . BACK SURGERY    . BACK SURGERY  07/16/2018  . BILATERAL SALPINGOOPHORECTOMY    . BIOPSY  12/24/2011   Procedure: BIOPSY;  Surgeon: Danie Binder, MD;  Location: [facility];  Service: Endoscopy;;  Gastric Biopsies  . BIOPSY N/A 06/30/2012   Procedure: BIOPSY;  Surgeon: Danie Binder, MD;  Location: [facility];  Service: Endoscopy;  Laterality: N/A;  Gastric and Esophageal Biopsies  . CARPAL TUNNEL RELEASE  LEFT  . CATARACT EXTRACTION W/PHACO Right 07/30/2016   Procedure: CATARACT EXTRACTION PHACO AND INTRAOCULAR LENS PLACEMENT (IOC);  Surgeon: Rutherford Guys, MD;  Location: [facility];  Service: Ophthalmology;  Laterality: Right;  CDE: 5.45  . CATARACT EXTRACTION W/PHACO Left 08/13/2016   Procedure: CATARACT EXTRACTION PHACO AND INTRAOCULAR LENS PLACEMENT (IOC);   Surgeon: Rutherford Guys, MD;  Location: [facility];  Service: Ophthalmology;  Laterality: Left;  CDE: 5.59  . COLON SURGERY    . COLONOSCOPY  JAN 2009 DIARRHEA, ABD PAIN, BRBPR   ANASTOMOTIC ULCER(3MM), IH ER:XVQMGQ ULCER, NL COLON bX  . COLONOSCOPY WITH PROPOFOL N/A 04/21/2018   examined portion of the ileum normal, two 3-5 mm polyps, diverticulosis in the rectosigmoid and sigmoid colon, external and internal hemorrhoids, significant looping of the colon.  Pathology with tubular adenomas.  Recommendations to follow high-fiber diet and repeat colonoscopy in 5 to 10 years with peds colonoscope.  Marland Kitchen DILATION AND CURETTAGE OF UTERUS    . ESOPHAGEAL DILATION  12/24/2011   SLF: A stricture was found in the distal esophagus/ Moderate gastritis/ MILD Duodenitis  . ESOPHAGOGASTRODUODENOSCOPY  02/07/09  mild gastritis  . ESOPHAGOGASTRODUODENOSCOPY (EGD) WITH PROPOFOL N/A 06/30/2012   SLF: 1. No definite stricture appreciated 2. small hiatal hernia 3. Gastritis  . ESOPHAGOGASTRODUODENOSCOPY (EGD) WITH PROPOFOL N/A 02/20/2016   Procedure: ESOPHAGOGASTRODUODENOSCOPY (EGD) WITH PROPOFOL;  Surgeon: Danie Binder, MD;  [facility];  Service: Endoscopy;  Laterality: N/A;  930  . ESOPHAGOGASTRODUODENOSCOPY (EGD) WITH PROPOFOL N/A 11/24/2018   benign-appearing esophageal peptic stricture s/p dilation, small hiatal hernia, moderate erosive gastritis.   Marland Kitchen EYE SURGERY    . gum flap Bilateral   . HEMICOLECTOMY  RIGHT 2006  . HERNIA REPAIR    . LUMBAR DISC SURGERY    . POLYPECTOMY  04/21/2018   Procedure: POLYPECTOMY;  Surgeon: Danie Binder, MD;  [facility];  Service: Endoscopy;;  (colon)  . SAVORY DILATION N/A 06/30/2012   Procedure: SAVORY DILATION;  Surgeon: Danie Binder, MD;  Location: [facility];  Service: Endoscopy;  Laterality: N/A;  12.61m , 175m 1596m. SAVORY DILATION N/A 02/20/2016   Procedure: SAVORY DILATION;  Surgeon: SanDanie BinderD;  [facility];  Service:  Endoscopy;  Laterality: N/A;  . SAVORY DILATION N/A 11/24/2018   Procedure: SAVORY DILATION;  Surgeon: FieDanie BinderD;  [facility];  Service: Endoscopy;  Laterality: N/A;  . UPPER GASTROINTESTINAL ENDOSCOPY  JAN 2009 ABD PAIN   GASTRITIS, ESO RING  . UPPER GASTROINTESTINAL ENDOSCOPY  OCT 2010 ABD PAIN, DYSPHAGIA   DIL 15MM, GASTRITIS, NL DUODENUM  . UPPER GASTROINTESTINAL ENDOSCOPY  OCT 2010 DYSPHAGIA   DIL 17 MM, GASTRITIS, NL DUODENUM  . vocal cord surgery  Sep 20, 2010   precancerous areas removed  . wisdom tooth extraction Right    pin placement due to broken jaw   Family History  Problem Relation Age of Onset  . Hypothyroidism Mother   . Heart disease Father   . Parkinson's disease Father   . Hypothyroidism Daughter   . Heart attack Paternal Grandfather   . Alzheimer's disease Paternal Grandmother   . Heart attack Maternal Grandfather   . Crohn's disease Sister   . Other Sister        was murdered  . Crohn's disease Maternal Uncle   . Colon cancer Neg Hx   . Colon polyps Neg Hx    Social History   Socioeconomic History  . Marital status: Single    Spouse name: WayWoodworth Number of children: 2  . Years of education: GED  . Highest education level: Not on file  Occupational History    Comment: disabled  Tobacco Use  . Smoking status: Every Day    Packs/day: 0.25    Years: 30.00    Pack years: 7.50    Types: Cigarettes  . Smokeless tobacco: Never  Vaping Use  . Vaping Use: Never used  Substance and Sexual Activity  . Alcohol use: Not Currently    Comment: wine/beer a few times a week   . Drug use: No  . Sexual activity: Not Currently    Birth control/protection: Post-menopausal  Other Topics Concern  . Not on file  Social History Narrative   Lives with husband   no caffiene   Social Determinants of Health   Financial Resource Strain: Not on file  Food Insecurity: Not on file  Transportation Needs: Not on file  Physical Activity: Not  on file  Stress: Not on file  Social Connections: Not on file    Tobacco Counseling Ready  to quit: Not Answered Counseling given: Not Answered   Clinical Intake:                 Diabetic?***         Activities of Daily Living No flowsheet data found.  Patient Care Team: Lindell Spar, MD as PCP - General (Internal Medicine) Danie Binder, MD (Inactive) (Gastroenterology) Eloise Harman, DO as Consulting Physician (Internal Medicine)  Indicate any recent Medical Services you may have received from other than [facility] providers in the past year (date may be approximate).     Assessment:   This is a routine wellness examination for [name] [name].  Hearing/Vision screen No results found.  Dietary issues and exercise activities discussed:     Goals Addressed   None   Depression Screen PHQ 2/9 Scores 12/13/2020 07/19/2020 06/27/2020 02/22/2019 04/16/2018 12/23/2017 06/25/2017  PHQ - 2 Score 0 0 0 1 1 1 2   PHQ- 9 Score - - - - - - 6    Fall Risk Fall Risk  12/13/2020 07/28/2020 07/19/2020 06/27/2020 02/22/2019  Falls in the past year? 0 0 0 0 0  Number falls in past yr: 0 0 0 0 -  Injury with Fall? 0 0 0 0 -  Comment - - - - -  Risk for fall due to : No Fall Risks - No Fall Risks - -  Follow up Falls evaluation completed - Falls evaluation completed Falls evaluation completed Falls evaluation completed    [place]:  Any stairs in or around the home?  If so, are there any without handrails?  Home free of loose throw rugs in walkways, pet beds, electrical cords, etc?  Adequate lighting in your home to reduce risk of falls?   ASSISTIVE DEVICES UTILIZED TO PREVENT FALLS:  Life alert?  Use of a cane, walker or w/c?  Grab bars in the bathroom?  Shower chair or bench in shower?  Elevated toilet seat or a handicapped toilet?   TIMED UP AND GO:  Was the test performed? .  Length of time to ambulate 10 feet: *** sec.      Cognitive Function:        Immunizations Immunization History  Administered Date(s) Administered  . DT (Pediatric) 02/17/2017  . H1N1 03/22/2008  . Influenza Whole 02/18/2006, 02/03/2007, 02/18/2008, 02/15/2010  . Influenza,inj,Quad PF,6+ Mos 04/13/2013, 02/22/2014, 01/09/2016, 02/24/2018, 02/22/2019  . Influenza-Unspecified 02/21/2017  . Pneumococcal Polysaccharide-23 02/18/2006, 08/08/2015  . Td 06/12/2009  . Tdap 08/08/2015, 02/21/2017  . Zoster Recombinat (Shingrix) 02/19/2017, 05/20/2017  . Zoster, Live 08/08/2015            Qualifies for Shingles Vaccine?   Zostavax completed     Screening Tests Health Maintenance  Topic Date Due  . COVID-19 Vaccine (1) Never done  . Pneumococcal Vaccine 55-41 Years old (3 - PCV) 08/07/2016  . PAP SMEAR-Modifier  04/02/2020  . INFLUENZA VACCINE  12/04/2020  . MAMMOGRAM  07/28/2022  . TETANUS/TDAP  02/22/2027  . COLONOSCOPY (Pts 45-4yr Insurance coverage will need to be confirmed)  04/21/2028  . Hepatitis C Screening  Completed  . HIV Screening  Completed  . Zoster Vaccines- Shingrix  Completed  . HPV VACCINES  Aged Out    Health Maintenance  Health Maintenance Due  Topic Date Due  . COVID-19 Vaccine (1) Never done  . Pneumococcal Vaccine [id number] old (3 - PCV) 08/07/2016  . PAP SMEAR-Modifier  04/02/2020  . INFLUENZA VACCINE  12/04/2020          Lung Cancer Screening: (Low Dose CT Chest recommended if Age 28-80 years, 30 pack-year currently smoking OR have quit w/in 15years.)  qualify.   Lung Cancer Screening Referral: ***  Additional Screening:  Hepatitis C Screening:  qualify; Completed ***  Vision Screening: Recommended annual ophthalmology exams for early detection of glaucoma and other disorders of the eye. Is the patient up to date with their annual eye exam?   Who is the provider or what is the name of the office in which the patient attends annual eye exams? *** If pt is not  established with a provider, would they like to be referred to a provider to establish care? .   Dental Screening: Recommended annual dental exams for proper oral hygiene  Community Resource Referral / Chronic Care Management: CRR required this visit?    CCM required this visit?       Plan:     I have personally reviewed and noted the following in the patient's chart:   Medical and social history Use of alcohol, tobacco or illicit drugs  Current medications and supplements including opioid prescriptions.  Functional ability and status Nutritional status Physical activity Advanced directives List of other physicians Hospitalizations, surgeries, and ER visits in previous 12 months Vitals Screenings to include cognitive, depression, and falls Referrals and appointments  In addition, I have reviewed and discussed with patient certain preventive protocols, quality metrics, and best practice recommendations. A written personalized care plan for preventive services as well as general preventive health recommendations were provided to patient.     Shelda Altes, CMA   01/13/2021   Nurse Notes: ***

## 2021-01-13 NOTE — Progress Notes (Signed)
Subjective:   [name] [name] is a 65 y.o. female who presents for Medicare Annual (Subsequent) preventive examination.  I connected with  Jaquay B Westerhoff on 01/13/21 by an audio enabled telemedicine application and verified that I am speaking with the correct person using two identifiers.   I discussed the limitations, risks, security and privacy concerns of performing an evaluation and management service by telephone and the availability of in person appointments. I also discussed with the patient that there may be a patient responsible charge related to this service. The patient expressed understanding and verbally consented to this telephonic visit.  Review of Systems           Objective:    There were no vitals filed for this visit. There is no height or weight on file to calculate BMI.  Advanced Directives 11/24/2018 07/16/2018 07/09/2018 04/21/2018 04/14/2018 02/19/2017 08/13/2016  Does Patient Have a Medical Advance Directive? No No No No No Yes No  Would patient like information on creating a medical advance directive? No - Patient declined No - Patient declined No - Patient declined No - Patient declined No - Patient declined - No - Patient declined  Pre-existing out of facility DNR order (yellow form or pink MOST form) - - - - - - -    Current Medications (verified) Outpatient Encounter Medications as of 01/13/2021  Medication Sig   albuterol (VENTOLIN HFA) 108 (90 Base) MCG/ACT inhaler Inhale 2 puffs into the lungs every 6 (six) hours as needed for wheezing or shortness of breath.   amLODipine (NORVASC) 5 MG tablet Take 1 tablet by mouth once daily   benzonatate (TESSALON) 100 MG capsule TAKE 1 CAPSULE BY MOUTH TWICE DAILY AS NEEDED FOR COUGH   calcium carbonate (TUMS - DOSED IN MG ELEMENTAL CALCIUM) 500 MG chewable tablet Chew 1 tablet (200 mg of elemental calcium total) by mouth 3 (three) times daily with meals.   cholestyramine (QUESTRAN) 4 GM/DOSE powder DISSOLVE AND TAKE 2  GRAMS BY MOUTH TWICE DAILY WITH A MEAL. DO NOT TAKE WITHIN 2 HOURS OF OTHER MEDICATIONS.   clotrimazole (CLOTRIMAZOLE AF) 1 % cream Apply 1 application topically 2 (two) times daily. To affected area for 1-2 weeks   cyclobenzaprine (FLEXERIL) 10 MG tablet Take 10 mg by mouth as needed for muscle spasms.    dexlansoprazole (DEXILANT) 60 MG capsule 1 PO EVERY MORNING WITH BREAKFAST.   diazepam (VALIUM) 5 MG tablet Take 5 mg by mouth every 12 (twelve) hours as needed for anxiety.   dicyclomine (BENTYL) 10 MG capsule Take 1 capsule (10 mg total) by mouth 4 (four) times daily. (Patient not taking: Reported on 12/13/2020)   DULoxetine (CYMBALTA) 30 MG capsule Take 30 mg by mouth 2 (two) times daily.   fluticasone (FLONASE) 50 MCG/ACT nasal spray USE TWO SPRAY(S) IN EACH NOSTRIL ONCE DAILY AS NEEDED FOR ALLERGY OR RHINITIS (Patient taking differently: Place 2 sprays into both nostrils daily as needed for allergies or rhinitis.)   gabapentin (NEURONTIN) 400 MG capsule Take 1 capsule (400 mg total) by mouth 4 (four) times daily. (Patient taking differently: Take 400 mg by mouth 3 (three) times daily.)   lactase (LACTAID) 3000 units tablet Take 3,000 Units by mouth daily as needed (lactose intolerance).   levothyroxine (SYNTHROID) 25 MCG tablet Take 1 tablet (25 mcg total) by mouth daily.   Lidocaine HCl 4 % LIQD Apply 1 application topically 3 (three) times daily as needed (pain).   loratadine (CLARITIN) 10 MG tablet  Take 1 tablet (10 mg total) by mouth daily.   magnesium hydroxide (MILK OF MAGNESIA) 400 MG/5ML suspension Take 30 mLs by mouth daily as needed for mild constipation.   Menthol 10 % AERO Apply 1 application topically 3 (three) times daily as needed (pain).   nystatin (MYCOSTATIN) 100000 UNIT/ML suspension TAKE 5 ML BY MOUTH  4 TIMES DAILY AS NEEDED FOR THRUSH   oxyCODONE-acetaminophen (PERCOCET) 10-325 MG tablet Take 1 tablet by mouth every 6 (six) hours as needed for pain. (Patient not taking:  Reported on 12/13/2020)   SYMBICORT 80-4.5 MCG/ACT inhaler Inhale 2 puffs into the lungs 2 (two) times daily. (Patient taking differently: Inhale 2 puffs into the lungs. 1-2 times per day)   triamcinolone (KENALOG) 0.1 % Apply 1 application topically 2 (two) times daily.   UNABLE TO FIND Capsule to rebuild cells-BID   UNABLE TO FIND as needed. Penetrex cream   valACYclovir (VALTREX) 1000 MG tablet Take 1 tablet (1,000 mg total) by mouth 2 (two) times daily.   No facility-administered encounter medications on file as of 01/13/2021.    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   Crohn disease ([place])    CTS (carpal tunnel syndrome)    Diarrhea MULITIFACTORIAL   IBS, LACTOSE INTOLERANCE, SBBO, BILE-SALT   Elevated liver enzymes 2009: BMI 24 ?Etoh ALT 94, AST 51,  NEG ANA, qIGs& ASMA   MAY 2012 AST 52 ALT 38   Esophageal stricture 2009   GERD (gastroesophageal reflux disease)    Hemorrhoid    Hyperlipemia    Hypertension    Inflammatory bowel disease 2006 CD   SURGICAL REMISSION   LBP (low back pain)    Mental disorder    Migraine    Osteoporosis    PONV (postoperative nausea and vomiting)    Shortness of breath    exertion and humidity   Shoulder pain    right   Sleep apnea    Stop Bang Cock score of 4   Past Surgical History:  Procedure Laterality Date   BACK SURGERY     BACK SURGERY  07/16/2018   BILATERAL SALPINGOOPHORECTOMY     BIOPSY  12/24/2011   Procedure: BIOPSY;  Surgeon: Danie Binder, MD;  Location: [facility];  Service: Endoscopy;;  Gastric Biopsies   BIOPSY N/A 06/30/2012   Procedure: BIOPSY;  Surgeon: Danie Binder, MD;   Location: [facility];  Service: Endoscopy;  Laterality: N/A;  Gastric and Esophageal Biopsies   CARPAL TUNNEL RELEASE  LEFT   CATARACT EXTRACTION W/PHACO Right 07/30/2016   Procedure: CATARACT EXTRACTION PHACO AND INTRAOCULAR LENS PLACEMENT ([place]);  Surgeon: Rutherford Guys, MD;  Location: [facility];  Service: Ophthalmology;  Laterality: Right;  CDE: 5.45   CATARACT EXTRACTION W/PHACO Left 08/13/2016   Procedure: CATARACT EXTRACTION PHACO AND INTRAOCULAR LENS PLACEMENT ([facility]);  Surgeon: Rutherford Guys, MD;  Location: [facility];  Service: Ophthalmology;  Laterality: Left;  CDE: 5.59   COLON SURGERY     COLONOSCOPY  JAN 2009 DIARRHEA, ABD PAIN, BRBPR   ANASTOMOTIC ULCER(3MM), IH NZ:VJKQAS ULCER, NL COLON bX   COLONOSCOPY WITH PROPOFOL N/A 04/21/2018  examined portion of the ileum normal, two 3-5 mm polyps, diverticulosis in the rectosigmoid and sigmoid colon, external and internal hemorrhoids, significant looping of the colon.  Pathology with tubular adenomas.  Recommendations to follow high-fiber diet and repeat colonoscopy in 5 to 10 years with peds colonoscope.   DILATION AND CURETTAGE OF UTERUS     ESOPHAGEAL DILATION  12/24/2011   SLF: A stricture was found in the distal esophagus/ Moderate gastritis/ MILD Duodenitis   ESOPHAGOGASTRODUODENOSCOPY  02/07/09   mild gastritis   ESOPHAGOGASTRODUODENOSCOPY (EGD) WITH PROPOFOL N/A 06/30/2012   SLF: 1. No definite stricture appreciated 2. small hiatal hernia 3. Gastritis   ESOPHAGOGASTRODUODENOSCOPY (EGD) WITH PROPOFOL N/A 02/20/2016   Procedure: ESOPHAGOGASTRODUODENOSCOPY (EGD) WITH PROPOFOL;  Surgeon: Danie Binder, MD;  [facility];  Service: Endoscopy;  Laterality: N/A;  930   ESOPHAGOGASTRODUODENOSCOPY (EGD) WITH PROPOFOL N/A 11/24/2018   benign-appearing esophageal peptic stricture s/p dilation, small hiatal hernia, moderate erosive gastritis.    EYE SURGERY     gum flap Bilateral    HEMICOLECTOMY  RIGHT 2006   HERNIA REPAIR     LUMBAR DISC  SURGERY     POLYPECTOMY  04/21/2018   Procedure: POLYPECTOMY;  Surgeon: Danie Binder, MD;  [facility];  Service: Endoscopy;;  (colon)   SAVORY DILATION N/A 06/30/2012   Procedure: SAVORY DILATION;  Surgeon: Danie Binder, MD;  Location: [facility];  Service: Endoscopy;  Laterality: N/A;  12.41m , 159m 1588m SAVORY DILATION N/A 02/20/2016   Procedure: SAVORY DILATION;  Surgeon: SanDanie BinderD;  [facility];  Service: Endoscopy;  Laterality: N/A;   SAVORY DILATION N/A 11/24/2018   Procedure: SAVORY DILATION;  Surgeon: FieDanie BinderD;  [facility];  Service: Endoscopy;  Laterality: N/A;   UPPER GASTROINTESTINAL ENDOSCOPY  JAN 2009 ABD PAIN   GASTRITIS, ESO RING   UPPER GASTROINTESTINAL ENDOSCOPY  OCT 2010 ABD PAIN, DYSPHAGIA   DIL 15MM, GASTRITIS, NL DUODENUM   UPPER GASTROINTESTINAL ENDOSCOPY  OCT 2010 DYSPHAGIA   DIL 17 MM, GASTRITIS, NL DUODENUM   vocal cord surgery  Sep 20, 2010   precancerous areas removed   wisdom tooth extraction Right    pin placement due to broken jaw   Family History  Problem Relation Age of Onset   Hypothyroidism Mother    Heart disease Father    Parkinson's disease Father    Hypothyroidism Daughter    Heart attack Paternal Grandfather    Alzheimer's disease Paternal Grandmother    Heart attack Maternal Grandfather    Crohn's disease Sister    Other Sister        was murdered   Crohn's disease Maternal Uncle    Colon cancer Neg Hx    Colon polyps Neg Hx    Social History   Socioeconomic History   Marital status: Single    Spouse name: WayPatrick JupiterNumber of children: 2   Years of education: GED   Highest education level: Not on file  Occupational History    Comment: disabled  Tobacco Use   Smoking status: Every Day    Packs/day: 0.25    Years: 30.00    Pack years: 7.50    Types: Cigarettes   Smokeless tobacco: Never  Vaping Use   Vaping Use: Never used  Substance and Sexual Activity   Alcohol  use: Not Currently    Comment: wine/beer a few times a week    Drug  use: No   Sexual activity: Not Currently    Birth control/protection: Post-menopausal  Other Topics Concern   Not on file  Social History Narrative   Lives with husband   no caffiene   Social Determinants of Health   Financial Resource Strain: Not on file  Food Insecurity: Not on file  Transportation Needs: Not on file  Physical Activity: Not on file  Stress: Not on file  Social Connections: Not on file    Tobacco Counseling Ready to quit: Not Answered Counseling given: Not Answered   Clinical Intake:                 Diabetic?No         Activities of Daily Living No flowsheet data found.  Patient Care Team: Lindell Spar, MD as PCP - General (Internal Medicine) Danie Binder, MD (Inactive) (Gastroenterology) Eloise Harman, DO as Consulting Physician (Internal Medicine)  Indicate any recent Medical Services you may have received from other than [facility] providers in the past year (date may be approximate).     Assessment:   This is a routine wellness examination for Kimberely.  Hearing/Vision screen No results found.  Dietary issues and exercise activities discussed:     Goals Addressed   None   Depression Screen PHQ 2/9 Scores 12/13/2020 07/19/2020 06/27/2020 02/22/2019 04/16/2018 12/23/2017 06/25/2017  PHQ - 2 Score 0 0 0 1 1 1 2   PHQ- 9 Score - - - - - - 6    Fall Risk Fall Risk  12/13/2020 07/28/2020 07/19/2020 06/27/2020 02/22/2019  Falls in the past year? 0 0 0 0 0  Number falls in past yr: 0 0 0 0 -  Injury with Fall? 0 0 0 0 -  Comment - - - - -  Risk for fall due to : No Fall Risks - No Fall Risks - -  Follow up Falls evaluation completed - Falls evaluation completed Falls evaluation completed Falls evaluation completed    [place]:  Any stairs in or around the home? No  If so, are there any without handrails? No  Home free of  loose throw rugs in walkways, pet beds, electrical cords, etc? Yes  Adequate lighting in your home to reduce risk of falls? Yes   ASSISTIVE DEVICES UTILIZED TO PREVENT FALLS:  Life alert? No  Use of a cane, walker or w/c? No  Grab bars in the bathroom? Yes  Shower chair or bench in shower? Yes  Elevated toilet seat or a handicapped toilet? Yes   TIMED UP AND GO:  Was the test performed? No.  Length of time to ambulate 10 feet: NA sec.     Cognitive Function:        Immunizations Immunization History  Administered Date(s) Administered   DT (Pediatric) 02/17/2017   H1N1 03/22/2008   Influenza Whole 02/18/2006, 02/03/2007, 02/18/2008, 02/15/2010   Influenza,inj,Quad PF,6+ Mos 04/13/2013, 02/22/2014, 01/09/2016, 02/24/2018, 02/22/2019   Influenza-Unspecified 02/21/2017   Pneumococcal Polysaccharide-23 02/18/2006, 08/08/2015   Td 06/12/2009   Tdap 08/08/2015, 02/21/2017   Zoster Recombinat (Shingrix) 02/19/2017, 05/20/2017   Zoster, Live 08/08/2015    TDAP status: Up to date  Flu Vaccine status: Due, Education has been provided regarding the importance of this vaccine. Advised may receive this vaccine at local pharmacy or Health Dept. Aware to provide a copy of the vaccination record if obtained from local pharmacy or Health Dept. Verbalized acceptance and understanding.  Pneumococcal vaccine status: Up  to date  Covid-19 vaccine status: Declined, Education has been provided regarding the importance of this vaccine but patient still declined. Advised may receive this vaccine at local pharmacy or Health Dept.or vaccine clinic. Aware to provide a copy of the vaccination record if obtained from local pharmacy or Health Dept. Verbalized acceptance and understanding.  Qualifies for Shingles Vaccine? Yes   Zostavax completed Yes   Shingrix Completed?: Yes  Screening Tests Health Maintenance  Topic Date Due   COVID-19 Vaccine (1) Never done   Pneumococcal Vaccine 0-64 Years  old (3 - PCV) 08/07/2016   PAP SMEAR-Modifier  04/02/2020   INFLUENZA VACCINE  12/04/2020   MAMMOGRAM  07/28/2022   TETANUS/TDAP  02/22/2027   COLONOSCOPY (Pts 45-36yr Insurance coverage will need to be confirmed)  04/21/2028   Hepatitis C Screening  Completed   HIV Screening  Completed   Zoster Vaccines- Shingrix  Completed   HPV VACCINES  Aged Out    Health Maintenance  Health Maintenance Due  Topic Date Due   COVID-19 Vaccine (1) Never done   Pneumococcal Vaccine 069622Years old (3 - PCV) 08/07/2016   PAP SMEAR-Modifier  04/02/2020   INFLUENZA VACCINE  12/04/2020    Colorectal cancer screening: Type of screening: Colonoscopy. Completed 04-21-18. Repeat every 10 years  Mammogram status: Completed 07-27-20. Repeat every year   Lung Cancer Screening: (Low Dose CT Chest recommended if Age 63-80years, 30 pack-year currently smoking OR have quit w/in 15years.) does qualify.   Lung Cancer Screening Referral: Referral placed   Additional Screening:  Hepatitis C Screening: does qualify; Completed 02-20-16  Vision Screening: Recommended annual ophthalmology exams for early detection of glaucoma and other disorders of the eye. Is the patient up to date with their annual eye exam?  No  Who is the provider or what is the name of the office in which the patient attends annual eye exams? Last provider SGershon Cranewill call insurance  If pt is not established with a provider, would they like to be referred to a provider to establish care? No .   Dental Screening: Recommended annual dental exams for proper oral hygiene  Community Resource Referral / Chronic Care Management: CRR required this visit?  No   CCM required this visit?  No      Plan:     I have personally reviewed and noted the following in the patient's chart:   Medical and social history Use of alcohol, tobacco or illicit drugs  Current medications and supplements including opioid prescriptions.  Functional ability  and status Nutritional status Physical activity Advanced directives List of other physicians Hospitalizations, surgeries, and ER visits in previous 12 months Vitals Screenings to include cognitive, depression, and falls Referrals and appointments  In addition, I have reviewed and discussed with patient certain preventive protocols, quality metrics, and best practice recommendations. A written personalized care plan for preventive services as well as general preventive health recommendations were provided to patient.     SShelda Altes CMA   01/13/2021   Nurse Notes:

## 2021-01-13 NOTE — Patient Instructions (Signed)
[name] [name] , Thank you for taking time to come for your Medicare Wellness Visit. I appreciate your ongoing commitment to your health goals. Please review the following plan we discussed and let me know if I can assist you in the future.   Screening recommendations/referrals: Colonoscopy: Due 04-21-2028 Mammogram: Due 07-28-22  Recommended yearly ophthalmology/optometry visit for glaucoma screening and checkup Recommended yearly dental visit for hygiene and checkup  Vaccinations: Influenza vaccine: Due now Pneumococcal vaccine: Completed Tdap vaccine: Completed Shingles vaccine: Completed    Advanced directives: Information provided  Conditions/risks identified: Hypertension  Next appointment: 1 year    Preventive Care 61 Years and Older, Female Preventive care refers to lifestyle choices and visits with your health care provider that can promote health and wellness. What does preventive care include? A yearly physical exam. This is also called an annual well check. Dental exams once or twice a year. Routine eye exams. Ask your health care provider how often you should have your eyes checked. Personal lifestyle choices, including: Daily care of your teeth and gums. Regular physical activity. Eating a healthy diet. Avoiding tobacco and drug use. Limiting alcohol use. Practicing safe sex. Taking low-dose aspirin every day. Taking vitamin and mineral supplements as recommended by your health care provider. What happens during an annual well check? The services and screenings done by your health care provider during your annual well check will depend on your age, overall health, lifestyle risk factors, and family history of disease. Counseling  Your health care provider may ask you questions about your: Alcohol use. Tobacco use. Drug use. Emotional well-being. Home and relationship well-being. Sexual activity. Eating habits. History of falls. Memory and ability to  understand (cognition). Work and work Statistician. Reproductive health. Screening  You may have the following tests or measurements: Height, weight, and BMI. Blood pressure. Lipid and cholesterol levels. These may be checked every 5 years, or more frequently if you are over 71 years old. Skin check. Lung cancer screening. You may have this screening every year starting at age 16 if you have a 30-pack-year history of smoking and currently smoke or have quit within the past 15 years. Fecal occult blood test (FOBT) of the stool. You may have this test every year starting at age 69. Flexible sigmoidoscopy or colonoscopy. You may have a sigmoidoscopy every 5 years or a colonoscopy every 10 years starting at age 39. Hepatitis C blood test. Hepatitis B blood test. Sexually transmitted disease (STD) testing. Diabetes screening. This is done by checking your blood sugar (glucose) after you have not eaten for a while (fasting). You may have this done every 1-3 years. Bone density scan. This is done to screen for osteoporosis. You may have this done starting at age 47. Mammogram. This may be done every 1-2 years. Talk to your health care provider about how often you should have regular mammograms. Talk with your health care provider about your test results, treatment options, and if necessary, the need for more tests. Vaccines  Your health care provider may recommend certain vaccines, such as: Influenza vaccine. This is recommended every year. Tetanus, diphtheria, and acellular pertussis (Tdap, Td) vaccine. You may need a Td booster every 10 years. Zoster vaccine. You may need this after age 14. Pneumococcal 13-valent conjugate (PCV13) vaccine. One dose is recommended after age 80. Pneumococcal polysaccharide (PPSV23) vaccine. One dose is recommended after age 87. Talk to your health care provider about which screenings and vaccines you need and how often you need them.  This information is not  intended to replace advice given to you by your health care provider. Make sure you discuss any questions you have with your health care provider. Document Released: 05/19/2015 Document Revised: 01/10/2016 Document Reviewed: 02/21/2015 Elsevier Interactive Patient Education  2017 [place] Prevention in the Home Falls can cause injuries. They can happen to people of all ages. There are many things you can do to make your home safe and to help prevent falls. What can I do on the outside of my home? Regularly fix the edges of walkways and driveways and fix any cracks. Remove anything that might make you trip as you walk through a door, such as a raised step or threshold. Trim any bushes or trees on the path to your home. Use bright outdoor lighting. Clear any walking paths of anything that might make someone trip, such as rocks or tools. Regularly check to see if handrails are loose or broken. Make sure that both sides of any steps have handrails. Any raised decks and porches should have guardrails on the edges. Have any leaves, snow, or ice cleared regularly. Use sand or salt on walking paths during winter. Clean up any spills in your garage right away. This includes oil or grease spills. What can I do in the bathroom? Use night lights. Install grab bars by the toilet and in the tub and shower. Do not use towel bars as grab bars. Use non-skid mats or decals in the tub or shower. If you need to sit down in the shower, use a plastic, non-slip stool. Keep the floor dry. Clean up any water that spills on the floor as soon as it happens. Remove soap buildup in the tub or shower regularly. Attach bath mats securely with double-sided non-slip rug tape. Do not have throw rugs and other things on the floor that can make you trip. What can I do in the bedroom? Use night lights. Make sure that you have a light by your bed that is easy to reach. Do not use any sheets or blankets that are  too big for your bed. They should not hang down onto the floor. Have a firm chair that has side arms. You can use this for support while you get dressed. Do not have throw rugs and other things on the floor that can make you trip. What can I do in the kitchen? Clean up any spills right away. Avoid walking on wet floors. Keep items that you use a lot in easy-to-reach places. If you need to reach something above you, use a strong step stool that has a grab bar. Keep electrical cords out of the way. Do not use floor polish or wax that makes floors slippery. If you must use wax, use non-skid floor wax. Do not have throw rugs and other things on the floor that can make you trip. What can I do with my stairs? Do not leave any items on the stairs. Make sure that there are handrails on both sides of the stairs and use them. Fix handrails that are broken or loose. Make sure that handrails are as long as the stairways. Check any carpeting to make sure that it is firmly attached to the stairs. Fix any carpet that is loose or worn. Avoid having throw rugs at the top or bottom of the stairs. If you do have throw rugs, attach them to the floor with carpet tape. Make sure that you have a light switch at the  top of the stairs and the bottom of the stairs. If you do not have them, ask someone to add them for you. What else can I do to help prevent falls? Wear shoes that: Do not have high heels. Have rubber bottoms. Are comfortable and fit you well. Are closed at the toe. Do not wear sandals. If you use a stepladder: Make sure that it is fully opened. Do not climb a closed stepladder. Make sure that both sides of the stepladder are locked into place. Ask someone to hold it for you, if possible. Clearly mark and make sure that you can see: Any grab bars or handrails. First and last steps. Where the edge of each step is. Use tools that help you move around (mobility aids) if they are needed. These  include: Canes. Walkers. Scooters. Crutches. Turn on the lights when you go into a dark area. Replace any light bulbs as soon as they burn out. Set up your furniture so you have a clear path. Avoid moving your furniture around. If any of your floors are uneven, fix them. If there are any pets around you, be aware of where they are. Review your medicines with your doctor. Some medicines can make you feel dizzy. This can increase your chance of falling. Ask your doctor what other things that you can do to help prevent falls. This information is not intended to replace advice given to you by your health care provider. Make sure you discuss any questions you have with your health care provider. Document Released: 02/16/2009 Document Revised: 09/28/2015 Document Reviewed: 05/27/2014 Elsevier Interactive Patient Education  2017 [employer].

## 2021-01-24 ENCOUNTER — Encounter: Payer: Self-pay | Admitting: Internal Medicine

## 2021-01-24 ENCOUNTER — Ambulatory Visit (INDEPENDENT_AMBULATORY_CARE_PROVIDER_SITE_OTHER): Payer: Medicare Other | Admitting: Internal Medicine

## 2021-01-24 ENCOUNTER — Other Ambulatory Visit: Payer: Self-pay

## 2021-01-24 VITALS — BP 128/74 | HR 93 | Temp 98.6°F | Resp 18 | Ht 60.0 in | Wt 115.0 lb

## 2021-01-24 DIAGNOSIS — M5442 Lumbago with sciatica, left side: Secondary | ICD-10-CM | POA: Diagnosis not present

## 2021-01-24 DIAGNOSIS — G8929 Other chronic pain: Secondary | ICD-10-CM | POA: Diagnosis not present

## 2021-01-24 DIAGNOSIS — Z981 Arthrodesis status: Secondary | ICD-10-CM | POA: Diagnosis not present

## 2021-01-24 DIAGNOSIS — G894 Chronic pain syndrome: Secondary | ICD-10-CM

## 2021-01-24 MED ORDER — PREDNISONE 10 MG (21) PO TBPK: ORAL_TABLET | ORAL | 0 refills | Status: DC

## 2021-01-24 NOTE — Patient Instructions (Signed)
Please take Prednisone as prescribed for back pain and inflammation.  Please get X-ray of lumbar spine done at [facility].

## 2021-01-24 NOTE — Progress Notes (Addendum)
Acute Office Visit  Subjective:    Patient ID: [name] [name], female    DOB: 1955-12-24, 65 y.o.   MRN: [id number]  Chief Complaint  Patient presents with   Follow-up   Back Pain    Back pain /stomach pain     HPI Patient is in today for c/o back pain after she heard a popping sound in her back few days ago. She has h/o lumbar fusion surgery and has tried contacting her Spine surgeon and is awaiting their response. She denies any recent fall or injury. She has chronic, intermittent numbness of left LE. She used to take chronic opioids for pain, but has been switched to non-opioid medications by her pain specialist. Denies saddle anesthesia, urinary or stool incontinence.  She has h/o IBD and has tried multiple medications for it with no relief. She was given Bentyl for abdominal pain, but she has vision problem with it. She had throat irritation with Levsin. She denies any melena or hematochezia currently.  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   Crohn disease ([place])    CTS (carpal tunnel syndrome)    Diarrhea MULITIFACTORIAL   IBS, LACTOSE INTOLERANCE, SBBO, BILE-SALT   Elevated liver enzymes 2009: BMI 24 ?Etoh ALT 94, AST 51,  NEG ANA, qIGs& ASMA   MAY 2012 AST 52 ALT 38   Esophageal stricture 2009   GERD (gastroesophageal reflux disease)    Hemorrhoid    Hyperlipemia    Hypertension    Inflammatory bowel disease 2006 CD   SURGICAL REMISSION   LBP (low back pain)    Mental disorder    Migraine    Osteoporosis    PONV (postoperative nausea and vomiting)    Shortness of breath    exertion and humidity   Shoulder pain    right   Sleep apnea    Stop Bang Cock score of 4    Past Surgical History:  Procedure Laterality Date   BACK SURGERY     BACK SURGERY  07/16/2018   BILATERAL  SALPINGOOPHORECTOMY     BIOPSY  12/24/2011   Procedure: BIOPSY;  Surgeon: Danie Binder, MD;  Location: [facility];  Service: Endoscopy;;  Gastric Biopsies   BIOPSY N/A 06/30/2012   Procedure: BIOPSY;  Surgeon: Danie Binder, MD;  Location: [facility];  Service: Endoscopy;  Laterality: N/A;  Gastric and Esophageal Biopsies   CARPAL TUNNEL RELEASE  LEFT   CATARACT EXTRACTION W/PHACO Right 07/30/2016   Procedure: CATARACT EXTRACTION PHACO AND INTRAOCULAR LENS PLACEMENT ([place]);  Surgeon: Rutherford Guys, MD;  Location: [facility];  Service: Ophthalmology;  Laterality: Right;  CDE: 5.45   CATARACT EXTRACTION W/PHACO Left 08/13/2016   Procedure: CATARACT EXTRACTION PHACO AND INTRAOCULAR LENS PLACEMENT ([facility]);  Surgeon: Rutherford Guys, MD;  Location: [facility];  Service: Ophthalmology;  Laterality: Left;  CDE: 5.59   COLON SURGERY     COLONOSCOPY  JAN 2009 DIARRHEA, ABD PAIN, BRBPR   ANASTOMOTIC ULCER(3MM), IH OI:BBCWUG ULCER, NL COLON bX   COLONOSCOPY WITH PROPOFOL N/A 04/21/2018   examined portion of the ileum normal, two 3-5 mm polyps, diverticulosis in the rectosigmoid and sigmoid colon, external and internal hemorrhoids, significant looping of the colon.  Pathology with tubular adenomas.  Recommendations to follow high-fiber diet and repeat colonoscopy in 5 to 10 years with peds colonoscope.   DILATION AND CURETTAGE OF UTERUS     ESOPHAGEAL DILATION  12/24/2011   SLF: A stricture was found in the distal esophagus/ Moderate gastritis/ MILD Duodenitis   ESOPHAGOGASTRODUODENOSCOPY  02/07/09   mild gastritis   ESOPHAGOGASTRODUODENOSCOPY (EGD) WITH PROPOFOL N/A 06/30/2012   SLF: 1. No definite stricture appreciated 2. small hiatal hernia 3. Gastritis   ESOPHAGOGASTRODUODENOSCOPY (EGD) WITH PROPOFOL N/A 02/20/2016   Procedure: ESOPHAGOGASTRODUODENOSCOPY (EGD) WITH PROPOFOL;  Surgeon: Danie Binder, MD;  [facility];  Service: Endoscopy;  Laterality: N/A;  930   ESOPHAGOGASTRODUODENOSCOPY (EGD) WITH PROPOFOL N/A  11/24/2018   benign-appearing esophageal peptic stricture s/p dilation, small hiatal hernia, moderate erosive gastritis.    EYE SURGERY     gum flap Bilateral    HEMICOLECTOMY  RIGHT 2006   HERNIA REPAIR     LUMBAR DISC SURGERY     POLYPECTOMY  04/21/2018   Procedure: POLYPECTOMY;  Surgeon: Danie Binder, MD;  [facility];  Service: Endoscopy;;  (colon)   SAVORY DILATION N/A 06/30/2012   Procedure: SAVORY DILATION;  Surgeon: Danie Binder, MD;  Location: [facility];  Service: Endoscopy;  Laterality: N/A;  12.5m , 148m 1553m SAVORY DILATION N/A 02/20/2016   Procedure: SAVORY DILATION;  Surgeon: SanDanie BinderD;  [facility];  Service: Endoscopy;  Laterality: N/A;   SAVORY DILATION N/A 11/24/2018   Procedure: SAVORY DILATION;  Surgeon: FieDanie BinderD;  [facility];  Service: Endoscopy;  Laterality: N/A;   UPPER GASTROINTESTINAL ENDOSCOPY  JAN 2009 ABD PAIN   GASTRITIS, ESO RING   UPPER GASTROINTESTINAL ENDOSCOPY  OCT 2010 ABD PAIN, DYSPHAGIA   DIL 15MM, GASTRITIS, NL DUODENUM   UPPER GASTROINTESTINAL ENDOSCOPY  OCT 2010 DYSPHAGIA   DIL 17 MM, GASTRITIS, NL DUODENUM   vocal cord surgery  Sep 20, 2010   precancerous areas removed   wisdom tooth extraction Right    pin placement due to broken jaw    Family History  Problem Relation Age of Onset   Hypothyroidism Mother    Heart disease Father    Parkinson's disease Father    Hypothyroidism Daughter    Heart attack Paternal Grandfather    Alzheimer's disease Paternal Grandmother    Heart attack Maternal Grandfather    Crohn's disease Sister    Other Sister        was murdered   Crohn's disease Maternal Uncle    Colon cancer Neg Hx    Colon polyps Neg Hx     Social History   Socioeconomic History   Marital status: Single    Spouse name: WayPatrick JupiterNumber of children: 2   Years of education: GED   Highest education level: Not on file  Occupational History    Comment: disabled   Tobacco Use   Smoking status: Every Day    Packs/day: 0.25    Years: 30.00    Pack years: 7.50    Types: Cigarettes   Smokeless tobacco: Never  Vaping Use   Vaping Use: Never used  Substance and Sexual Activity   Alcohol use: Not Currently    Comment: wine/beer a few times a week    Drug use: No   Sexual activity: Not Currently    Birth control/protection: Post-menopausal  Other Topics Concern   Not on file  Social History Narrative   Lives  with husband   no caffiene   Social Determinants of Radio broadcast assistant Strain: Low Risk    Difficulty of Paying Living Expenses: Not hard at all  Food Insecurity: No Food Insecurity   Worried About Charity fundraiser in the Last Year: Never true   Arboriculturist in the Last Year: Never true  Transportation Needs: No Transportation Needs   Lack of Transportation (Medical): No   Lack of Transportation (Non-Medical): No  Physical Activity: Insufficiently Active   Days of Exercise per Week: 3 days   Minutes of Exercise per Session: 30 min  Stress: No Stress Concern Present   Feeling of Stress : Not at all  Social Connections: Moderately Integrated   Frequency of Communication with Friends and Family: More than three times a week   Frequency of Social Gatherings with Friends and Family: More than three times a week   Attends Religious Services: More than 4 times per year   Active Member of [employer] or Organizations: No   Attends Archivist Meetings: Never   Marital Status: Married  Human resources officer Violence: Not At Risk   Fear of Current or Ex-Partner: No   Emotionally Abused: No   Physically Abused: No   Sexually Abused: No    Outpatient Medications Prior to Visit  Medication Sig Dispense Refill   albuterol (VENTOLIN HFA) 108 (90 Base) MCG/ACT inhaler Inhale 2 puffs into the lungs every 6 (six) hours as needed for wheezing or shortness of breath. 18 g 11   amLODipine (NORVASC) 5 MG tablet Take 1 tablet by mouth  once daily 90 tablet 0   benzonatate (TESSALON) 100 MG capsule TAKE 1 CAPSULE BY MOUTH TWICE DAILY AS NEEDED FOR COUGH 30 capsule 0   calcium carbonate (TUMS - DOSED IN MG ELEMENTAL CALCIUM) 500 MG chewable tablet Chew 1 tablet (200 mg of elemental calcium total) by mouth 3 (three) times daily with meals. 90 tablet 6   cholestyramine (QUESTRAN) 4 GM/DOSE powder DISSOLVE AND TAKE 2 GRAMS BY MOUTH TWICE DAILY WITH A MEAL. DO NOT TAKE WITHIN 2 HOURS OF OTHER MEDICATIONS. 378 g 0   clotrimazole (CLOTRIMAZOLE AF) 1 % cream Apply 1 application topically 2 (two) times daily. To affected area for 1-2 weeks 30 g 0   cyclobenzaprine (FLEXERIL) 10 MG tablet Take 10 mg by mouth as needed for muscle spasms.      dexlansoprazole (DEXILANT) 60 MG capsule 1 PO EVERY MORNING WITH BREAKFAST. 90 capsule 3   diazepam (VALIUM) 5 MG tablet Take 5 mg by mouth every 12 (twelve) hours as needed for anxiety.     fluticasone (FLONASE) 50 MCG/ACT nasal spray USE TWO SPRAY(S) IN EACH NOSTRIL ONCE DAILY AS NEEDED FOR ALLERGY OR RHINITIS 16 g 2   gabapentin (NEURONTIN) 400 MG capsule Take 1 capsule (400 mg total) by mouth 4 (four) times daily. (Patient taking differently: Take 400 mg by mouth 3 (three) times daily.)     lactase (LACTAID) 3000 units tablet Take 3,000 Units by mouth daily as needed (lactose intolerance).     levothyroxine (SYNTHROID) 25 MCG tablet Take 1 tablet (25 mcg total) by mouth daily. 30 tablet 2   Lidocaine HCl 4 % LIQD Apply 1 application topically 3 (three) times daily as needed (pain).     loratadine (CLARITIN) 10 MG tablet Take 1 tablet (10 mg total) by mouth daily. 30 tablet 3   magnesium hydroxide (MILK OF MAGNESIA) 400 MG/5ML suspension Take 30 mLs  by mouth daily as needed for mild constipation.     Menthol 10 % AERO Apply 1 application topically 3 (three) times daily as needed (pain).     nystatin (MYCOSTATIN) 100000 UNIT/ML suspension TAKE 5 ML BY MOUTH  4 TIMES DAILY AS NEEDED FOR THRUSH 500 mL 0    oxyCODONE-acetaminophen (PERCOCET) 10-325 MG tablet Take 1 tablet by mouth every 6 (six) hours as needed for pain.     SYMBICORT 80-4.5 MCG/ACT inhaler Inhale 2 puffs into the lungs 2 (two) times daily. (Patient taking differently: Inhale 2 puffs into the lungs. 1-2 times per day) 11 g 11   triamcinolone (KENALOG) 0.1 % Apply 1 application topically 2 (two) times daily. 30 g 0   UNABLE TO FIND Capsule to rebuild cells-BID     UNABLE TO FIND as needed. Penetrex cream     valACYclovir (VALTREX) 1000 MG tablet Take 1 tablet (1,000 mg total) by mouth 2 (two) times daily. 14 tablet 0   dicyclomine (BENTYL) 10 MG capsule Take 1 capsule (10 mg total) by mouth 4 (four) times daily. 90 capsule 1   DULoxetine (CYMBALTA) 30 MG capsule Take 30 mg by mouth 2 (two) times daily.     No facility-administered medications prior to visit.    Allergies  Allergen Reactions   Naproxen Shortness Of Breath    Chest pain   Dicyclomine Hcl Other (See Comments)    Vision changes   Duloxetine Other (See Comments)    Suicidal    Eggs Or Egg-Derived Products Diarrhea    abd pain   Lovastatin Other (See Comments)    Chest pain   Toradol [Ketorolac Tromethamine] Other (See Comments)    Headache. Non-effective   Tramadol Other (See Comments)    Headache. Non-effective.   Nexium [Esomeprazole Magnesium] Diarrhea    Abdominal pain    Review of Systems  Constitutional:  Negative for chills and fever.  HENT:  Negative for congestion, sinus pressure, sinus pain and sore throat.   Eyes:  Negative for pain and discharge.  Respiratory:  Negative for cough and shortness of breath.   Cardiovascular:  Negative for chest pain and palpitations.  Gastrointestinal:  Positive for abdominal pain (chronic). Negative for constipation, diarrhea, nausea and vomiting.  Endocrine: Negative for polydipsia and polyuria.  Genitourinary:  Negative for dysuria and hematuria.  Musculoskeletal:  Positive for arthralgias, back pain and  neck pain. Negative for neck stiffness.  Skin:  Negative for rash.  Neurological:  Positive for numbness. Negative for dizziness and weakness.  Psychiatric/Behavioral:  Negative for agitation and behavioral problems. The patient is nervous/anxious.       Objective:    Physical Exam Vitals reviewed.  Constitutional:      General: She is not in acute distress.    Appearance: She is not diaphoretic.  HENT:     Head: Normocephalic and atraumatic.     Nose: Nose normal.     Mouth/Throat:     Mouth: Mucous membranes are moist.  Eyes:     General: No scleral icterus.    Extraocular Movements: Extraocular movements intact.  Cardiovascular:     Rate and Rhythm: Normal rate and regular rhythm.     Pulses: Normal pulses.     Heart sounds: Normal heart sounds. No murmur heard. Pulmonary:     Breath sounds: Normal breath sounds. No wheezing or rales.  Abdominal:     Palpations: Abdomen is soft.     Tenderness: There is abdominal tenderness (Mild, generalized).  Musculoskeletal:        General: Tenderness (Lumbar spine area) present.     Cervical back: Neck supple. No tenderness.     Right lower leg: No edema.     Left lower leg: No edema.  Skin:    General: Skin is warm.     Findings: Bruising (Right cubital fossa due to recent blood draw) present.  Neurological:     General: No focal deficit present.     Mental Status: She is alert and oriented to person, place, and time.  Psychiatric:        Mood and Affect: Mood normal.        Behavior: Behavior normal.    BP 128/74 (BP Location: Left Arm, Patient Position: Sitting, Cuff Size: Normal)   Pulse 93   Temp 98.6 F (37 C) (Oral)   Resp 18   Ht 5' (1.524 m)   Wt 115 lb 0.6 oz (52.2 kg)   SpO2 (!) 88%   BMI 22.47 kg/m  Wt Readings from Last 3 Encounters:  01/24/21 115 lb 0.6 oz (52.2 kg)  12/13/20 111 lb (50.3 kg)  12/07/20 108 lb 6.4 oz (49.2 kg)    Health Maintenance Due  Topic Date Due   PAP SMEAR-Modifier   04/02/2020    There are no preventive care reminders to display for this patient.   Lab Results  Component Value Date   TSH 1.010 12/27/2011   Lab Results  Component Value Date   WBC 2.7 (L) 01/31/2020   HGB 15.1 (H) 01/31/2020   HCT 43.3 01/31/2020   MCV 104.6 (H) 01/31/2020   PLT 175 01/31/2020   Lab Results  Component Value Date   NA 138 01/31/2020   K 3.7 01/31/2020   CO2 26 01/31/2020   GLUCOSE 80 01/31/2020   BUN 5 (L) 01/31/2020   CREATININE 0.60 12/08/2020   BILITOT 0.6 01/31/2020   ALKPHOS 120 01/31/2020   AST 47 (H) 01/31/2020   ALT 33 01/31/2020   PROT 6.9 01/31/2020   ALBUMIN 3.9 01/31/2020   CALCIUM 8.8 (L) 01/31/2020   ANIONGAP 12 01/31/2020   Lab Results  Component Value Date   CHOL 271 (H) 01/31/2020   Lab Results  Component Value Date   HDL >135 01/31/2020   Lab Results  Component Value Date   LDLCALC NOT CALCULATED 01/31/2020   Lab Results  Component Value Date   TRIG 73 01/31/2020   Lab Results  Component Value Date   CHOLHDL NOT CALCULATED 01/31/2020   No results found for: HGBA1C     Assessment & Plan:   Problem List Items Addressed This Visit       Other   Chronic pain syndrome    From chronic back pain and IBD On Gabapentin and Cymbalta F/u with Dr Merlene Laughter      Relevant Medications   predniSONE (STERAPRED UNI-PAK 21 TAB) 10 MG (21) TBPK tablet   S/P lumbar fusion    F/u spine surgery      Relevant Orders   DG Lumbar Spine Complete   Chronic low back pain - Primary    Acute on chronic low back pain Check X-ray lumbar spine Sterapred for now She is allergic to multiple medications F/u with Spine surgery      Relevant Medications   predniSONE (STERAPRED UNI-PAK 21 TAB) 10 MG (21) TBPK tablet     Meds ordered this encounter  Medications   predniSONE (STERAPRED UNI-PAK 21 TAB) 10 MG (21) TBPK tablet  Sig: Take as package instructions.    Dispense:  1 each    Refill:  0     Matt Delpizzo Keith Rake, MD

## 2021-01-24 NOTE — Assessment & Plan Note (Signed)
Acute on chronic low back pain Check X-ray lumbar spine Sterapred for now She is allergic to multiple medications F/u with Spine surgery

## 2021-01-24 NOTE — Assessment & Plan Note (Signed)
F/u spine surgery

## 2021-01-24 NOTE — Assessment & Plan Note (Signed)
From chronic back pain and IBD On Gabapentin and Cymbalta F/u with Dr Merlene Laughter

## 2021-01-25 ENCOUNTER — Ambulatory Visit (HOSPITAL_COMMUNITY)
Admission: RE | Admit: 2021-01-25 | Discharge: 2021-01-25 | Disposition: A | Discharge status: Home / Self Care | Payer: Medicare Other | Source: Ambulatory Visit | Attending: Internal Medicine | Admitting: Internal Medicine

## 2021-01-25 DIAGNOSIS — Z981 Arthrodesis status: Secondary | ICD-10-CM | POA: Insufficient documentation

## 2021-01-25 DIAGNOSIS — M545 Low back pain, unspecified: Secondary | ICD-10-CM | POA: Diagnosis not present

## 2021-01-30 ENCOUNTER — Other Ambulatory Visit: Payer: Self-pay | Admitting: Student

## 2021-01-30 ENCOUNTER — Other Ambulatory Visit (HOSPITAL_COMMUNITY): Payer: Self-pay | Admitting: Student

## 2021-01-30 ENCOUNTER — Other Ambulatory Visit: Payer: Self-pay | Admitting: *Deleted

## 2021-01-30 DIAGNOSIS — G8929 Other chronic pain: Secondary | ICD-10-CM

## 2021-01-30 DIAGNOSIS — M5442 Lumbago with sciatica, left side: Secondary | ICD-10-CM | POA: Diagnosis not present

## 2021-01-30 MED ORDER — SYMBICORT 80-4.5 MCG/ACT IN AERO: 2.0000 | INHALATION_SPRAY | Freq: Two times a day (BID) | RESPIRATORY_TRACT | 11 refills | Status: DC

## 2021-02-07 DIAGNOSIS — Z79891 Long term (current) use of opiate analgesic: Secondary | ICD-10-CM | POA: Diagnosis not present

## 2021-02-07 DIAGNOSIS — M961 Postlaminectomy syndrome, not elsewhere classified: Secondary | ICD-10-CM | POA: Diagnosis not present

## 2021-02-07 DIAGNOSIS — G894 Chronic pain syndrome: Secondary | ICD-10-CM | POA: Diagnosis not present

## 2021-02-07 DIAGNOSIS — K589 Irritable bowel syndrome without diarrhea: Secondary | ICD-10-CM | POA: Diagnosis not present

## 2021-02-07 DIAGNOSIS — M542 Cervicalgia: Secondary | ICD-10-CM | POA: Diagnosis not present

## 2021-02-09 DIAGNOSIS — I10 Essential (primary) hypertension: Secondary | ICD-10-CM | POA: Diagnosis not present

## 2021-02-10 LAB — CBC WITH DIFFERENTIAL/PLATELET
Basophils Absolute: 0 10*3/uL (ref 0.0–0.2)
Basos: 1 %
EOS (ABSOLUTE): 0 10*3/uL (ref 0.0–0.4)
Eos: 0 %
Hematocrit: 51.8 % — ABNORMAL HIGH (ref 34.0–46.6)
Hemoglobin: 18.1 g/dL — ABNORMAL HIGH (ref 11.1–15.9)
Immature Grans (Abs): 0 10*3/uL (ref 0.0–0.1)
Immature Granulocytes: 0 %
Lymphocytes Absolute: 1.1 10*3/uL (ref 0.7–3.1)
Lymphs: 24 %
MCH: 36 pg — ABNORMAL HIGH (ref 26.6–33.0)
MCHC: 34.9 g/dL (ref 31.5–35.7)
MCV: 103 fL — ABNORMAL HIGH (ref 79–97)
Monocytes Absolute: 0.7 10*3/uL (ref 0.1–0.9)
Monocytes: 14 %
Neutrophils Absolute: 2.8 10*3/uL (ref 1.4–7.0)
Neutrophils: 61 %
Platelets: 197 10*3/uL (ref 150–450)
RBC: 5.03 x10E6/uL (ref 3.77–5.28)
RDW: 12.9 % (ref 11.7–15.4)
WBC: 4.6 10*3/uL (ref 3.4–10.8)

## 2021-02-10 LAB — CMP14+EGFR
ALT: 38 IU/L — ABNORMAL HIGH (ref 0–32)
AST: 49 IU/L — ABNORMAL HIGH (ref 0–40)
Albumin/Globulin Ratio: 1.7 (ref 1.2–2.2)
Albumin: 4.3 g/dL (ref 3.8–4.8)
Alkaline Phosphatase: 114 IU/L (ref 44–121)
BUN/Creatinine Ratio: 11 — ABNORMAL LOW (ref 12–28)
BUN: 7 mg/dL — ABNORMAL LOW (ref 8–27)
Bilirubin Total: 0.7 mg/dL (ref 0.0–1.2)
CO2: 22 mmol/L (ref 20–29)
Calcium: 9.7 mg/dL (ref 8.7–10.3)
Chloride: 100 mmol/L (ref 96–106)
Creatinine, Ser: 0.65 mg/dL (ref 0.57–1.00)
Globulin, Total: 2.5 g/dL (ref 1.5–4.5)
Glucose: 91 mg/dL (ref 70–99)
Potassium: 3.5 mmol/L (ref 3.5–5.2)
Sodium: 139 mmol/L (ref 134–144)
Total Protein: 6.8 g/dL (ref 6.0–8.5)
eGFR: 99 mL/min/{1.73_m2} (ref >59)

## 2021-02-10 LAB — HIV ANTIBODY (ROUTINE TESTING W REFLEX): HIV Screen 4th Generation wRfx: NONREACTIVE

## 2021-02-10 LAB — LIPID PANEL
Chol/HDL Ratio: 1.7 ratio (ref 0.0–4.4)
Cholesterol, Total: 239 mg/dL — ABNORMAL HIGH (ref 100–199)
HDL: 140 mg/dL (ref >39)
LDL Chol Calc (NIH): 84 mg/dL (ref 0–99)
Triglycerides: 93 mg/dL (ref 0–149)
VLDL Cholesterol Cal: 15 mg/dL (ref 5–40)

## 2021-02-10 LAB — TSH: TSH: 1.11 u[IU]/mL (ref 0.450–4.500)

## 2021-02-12 ENCOUNTER — Ambulatory Visit: Payer: Medicare Other | Admitting: Internal Medicine

## 2021-02-13 ENCOUNTER — Other Ambulatory Visit: Payer: Self-pay

## 2021-02-13 ENCOUNTER — Ambulatory Visit (HOSPITAL_COMMUNITY)
Admission: RE | Admit: 2021-02-13 | Discharge: 2021-02-13 | Disposition: A | Discharge status: Home / Self Care | Payer: Medicare Other | Source: Ambulatory Visit | Attending: Student | Admitting: Student

## 2021-02-13 DIAGNOSIS — M5442 Lumbago with sciatica, left side: Secondary | ICD-10-CM | POA: Diagnosis not present

## 2021-02-13 DIAGNOSIS — M5126 Other intervertebral disc displacement, lumbar region: Secondary | ICD-10-CM | POA: Diagnosis not present

## 2021-02-13 DIAGNOSIS — G8929 Other chronic pain: Secondary | ICD-10-CM | POA: Insufficient documentation

## 2021-02-14 ENCOUNTER — Ambulatory Visit (INDEPENDENT_AMBULATORY_CARE_PROVIDER_SITE_OTHER): Payer: Medicare Other | Admitting: Internal Medicine

## 2021-02-14 ENCOUNTER — Encounter: Payer: Self-pay | Admitting: Internal Medicine

## 2021-02-14 VITALS — BP 154/99 | HR 108 | Temp 98.3°F | Ht 60.0 in | Wt 111.1 lb

## 2021-02-14 DIAGNOSIS — I10 Essential (primary) hypertension: Secondary | ICD-10-CM

## 2021-02-14 DIAGNOSIS — K50019 Crohn's disease of small intestine with unspecified complications: Secondary | ICD-10-CM | POA: Diagnosis not present

## 2021-02-14 DIAGNOSIS — D751 Secondary polycythemia: Secondary | ICD-10-CM | POA: Diagnosis not present

## 2021-02-14 DIAGNOSIS — E039 Hypothyroidism, unspecified: Secondary | ICD-10-CM

## 2021-02-14 DIAGNOSIS — M5442 Lumbago with sciatica, left side: Secondary | ICD-10-CM

## 2021-02-14 DIAGNOSIS — G8929 Other chronic pain: Secondary | ICD-10-CM

## 2021-02-14 DIAGNOSIS — G894 Chronic pain syndrome: Secondary | ICD-10-CM | POA: Diagnosis not present

## 2021-02-14 MED ORDER — AMLODIPINE BESYLATE 10 MG PO TABS: 10.0000 mg | ORAL_TABLET | Freq: Every day | ORAL | 1 refills | Status: DC

## 2021-02-14 NOTE — Patient Instructions (Signed)
Please start taking Amlodipine 10 mg once daily.  Please continue taking other medications as prescribed.  Please cut down -> quit smoking.  You are being referred to pain clinic for second opinion.

## 2021-02-14 NOTE — Assessment & Plan Note (Signed)
From chronic back pain and IBD On Gabapentin and Cymbalta F/u with Dr Merlene Laughter, states that she has been taken off of her opioid medication and she has been in severe back and abdominal pain Referred to [facility] pain clinic for second opinion

## 2021-02-14 NOTE — Progress Notes (Signed)
Established Patient Office Visit  Subjective:  Patient ID: [name] [name], female    DOB: [birth year]  Age: 65 y.o. MRN: [id number]  CC:  Chief Complaint  Patient presents with   Follow-up    2 month follow up. Would like to discuss possible ref to new pain Dr.    Harrison Mons TYNESIA HARRAL is a 65 year old female with past medical history of hypertension, Crohn's disease status post partial bowel resection, hypothyroidism, GERD, DJD of cervical and lumbar spine, s/p lumbar fusion surgery, chronic pain syndrome and tobacco abuse who presents for follow up of her chronic medical conditions.  BP is uncontrolled. Takes medications regularly. Patient denies chest pain, dyspnea or palpitations.  She has history of Crohn's disease, but she is not on immunosuppressives. She has tried different regimens in the past. She was given Bentyl recently, but did not tolerate it. She follows up with [facility]. She denies any melena or hematochezia currently, but has chronic abdominal pain. She used to take opioid medications for chronic abdominal pain as well.  She has a history of chronic pain syndrome, for which she follows up with Dr. Merlene Laughter.  She is on gabapentin currently. She did not like mood symptoms from Cymbalta. She has been weaned off of her opioid medications by pain clinic, but she has been in severe pain since then and has crying affect today.  She has been tolerating Levothyroxine. Her TSH is well-controlled now. Denies any tremors or palpitations.  Blood tests were reviewed and discussed with the patient in detail.  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   Crohn disease ([place])    CTS (carpal tunnel syndrome)    Diarrhea MULITIFACTORIAL   IBS, LACTOSE INTOLERANCE, SBBO, BILE-SALT    Elevated liver enzymes 2009: BMI 24 ?Etoh ALT 94, AST 51,  NEG ANA, qIGs& ASMA   MAY 2012 AST 52 ALT 38   Esophageal stricture 2009   GERD (gastroesophageal reflux disease)    Hemorrhoid    Hyperlipemia    Hypertension    Inflammatory bowel disease 2006 CD   SURGICAL REMISSION   LBP (low back pain)    Mental disorder    Migraine    Osteoporosis    PONV (postoperative nausea and vomiting)    Shortness of breath    exertion and humidity   Shoulder pain    right   Sleep apnea    Stop Bang Cock score of 4    Past Surgical History:  Procedure Laterality Date   BACK SURGERY     BACK SURGERY  07/16/2018   BILATERAL SALPINGOOPHORECTOMY     BIOPSY  12/24/2011   Procedure: BIOPSY;  Surgeon: Danie Binder, MD;  Location: [facility];  Service: Endoscopy;;  Gastric Biopsies   BIOPSY N/A 06/30/2012   Procedure: BIOPSY;  Surgeon: Danie Binder, MD;  Location: [facility];  Service: Endoscopy;  Laterality: N/A;  Gastric and Esophageal Biopsies   CARPAL TUNNEL RELEASE  LEFT   CATARACT EXTRACTION W/PHACO Right 07/30/2016   Procedure: CATARACT EXTRACTION PHACO AND INTRAOCULAR LENS PLACEMENT ([place]);  Surgeon: Rutherford Guys, MD;  Location: [facility];  Service: Ophthalmology;  Laterality: Right;  CDE: 5.45   CATARACT EXTRACTION W/PHACO Left 08/13/2016  Procedure: CATARACT EXTRACTION PHACO AND INTRAOCULAR LENS PLACEMENT (I[facility]);  Surgeon: Rutherford Guys, MD;  Location: [facility];  Service: Ophthalmology;  Laterality: Left;  CDE: 5.59   COLON SURGERY     COLONOSCOPY  JAN 2009 DIARRHEA, ABD PAIN, BRBPR   ANASTOMOTIC ULCER(3MM), IH ME:QASTMH ULCER, NL COLON bX   COLONOSCOPY WITH PROPOFOL N/A 04/21/2018   examined portion of the ileum normal, two 3-5 mm polyps, diverticulosis in the rectosigmoid and sigmoid colon, external and internal hemorrhoids, significant looping of the colon.  Pathology with tubular adenomas.  Recommendations to follow high-fiber diet and repeat colonoscopy in 5 to 10 years with peds colonoscope.    DILATION AND CURETTAGE OF UTERUS     ESOPHAGEAL DILATION  12/24/2011   SLF: A stricture was found in the distal esophagus/ Moderate gastritis/ MILD Duodenitis   ESOPHAGOGASTRODUODENOSCOPY  02/07/09   mild gastritis   ESOPHAGOGASTRODUODENOSCOPY (EGD) WITH PROPOFOL N/A 06/30/2012   SLF: 1. No definite stricture appreciated 2. small hiatal hernia 3. Gastritis   ESOPHAGOGASTRODUODENOSCOPY (EGD) WITH PROPOFOL N/A 02/20/2016   Procedure: ESOPHAGOGASTRODUODENOSCOPY (EGD) WITH PROPOFOL;  Surgeon: Danie Binder, MD;  [facility];  Service: Endoscopy;  Laterality: N/A;  930   ESOPHAGOGASTRODUODENOSCOPY (EGD) WITH PROPOFOL N/A 11/24/2018   benign-appearing esophageal peptic stricture s/p dilation, small hiatal hernia, moderate erosive gastritis.    EYE SURGERY     gum flap Bilateral    HEMICOLECTOMY  RIGHT 2006   HERNIA REPAIR     LUMBAR DISC SURGERY     POLYPECTOMY  04/21/2018   Procedure: POLYPECTOMY;  Surgeon: Danie Binder, MD;  [facility];  Service: Endoscopy;;  (colon)   SAVORY DILATION N/A 06/30/2012   Procedure: SAVORY DILATION;  Surgeon: Danie Binder, MD;  Location: [facility];  Service: Endoscopy;  Laterality: N/A;  12.69m , 170m 1553m SAVORY DILATION N/A 02/20/2016   Procedure: SAVORY DILATION;  Surgeon: SanDanie BinderD;  [facility];  Service: Endoscopy;  Laterality: N/A;   SAVORY DILATION N/A 11/24/2018   Procedure: SAVORY DILATION;  Surgeon: FieDanie BinderD;  [facility];  Service: Endoscopy;  Laterality: N/A;   UPPER GASTROINTESTINAL ENDOSCOPY  JAN 2009 ABD PAIN   GASTRITIS, ESO RING   UPPER GASTROINTESTINAL ENDOSCOPY  OCT 2010 ABD PAIN, DYSPHAGIA   DIL 15MM, GASTRITIS, NL DUODENUM   UPPER GASTROINTESTINAL ENDOSCOPY  OCT 2010 DYSPHAGIA   DIL 17 MM, GASTRITIS, NL DUODENUM   vocal cord surgery  Sep 20, 2010   precancerous areas removed   wisdom tooth extraction Right    pin placement due to broken jaw    Family History   Problem Relation Age of Onset   Hypothyroidism Mother    Heart disease Father    Parkinson's disease Father    Hypothyroidism Daughter    Heart attack Paternal Grandfather    Alzheimer's disease Paternal Grandmother    Heart attack Maternal Grandfather    Crohn's disease Sister    Other Sister        was murdered   Crohn's disease Maternal Uncle    Colon cancer Neg Hx    Colon polyps Neg Hx     Social History   Socioeconomic History   Marital status: Single    Spouse name: WayPatrick JupiterNumber of children: 2   Years of education: GED   Highest education level: Not on file  Occupational History    Comment: disabled  Tobacco Use  Smoking status: Every Day    Packs/day: 0.25    Years: 30.00    Pack years: 7.50    Types: Cigarettes   Smokeless tobacco: Never  Vaping Use   Vaping Use: Never used  Substance and Sexual Activity   Alcohol use: Not Currently    Comment: wine/beer a few times a week    Drug use: No   Sexual activity: Not Currently    Birth control/protection: Post-menopausal  Other Topics Concern   Not on file  Social History Narrative   Lives with husband   no caffiene   Social Determinants of Health   Financial Resource Strain: Low Risk    Difficulty of Paying Living Expenses: Not hard at all  Food Insecurity: No Food Insecurity   Worried About Charity fundraiser in the Last Year: Never true   Ran Out of Food in the Last Year: Never true  Transportation Needs: No Transportation Needs   Lack of Transportation (Medical): No   Lack of Transportation (Non-Medical): No  Physical Activity: Insufficiently Active   Days of Exercise per Week: 3 days   Minutes of Exercise per Session: 30 min  Stress: No Stress Concern Present   Feeling of Stress : Not at all  Social Connections: Moderately Integrated   Frequency of Communication with Friends and Family: More than three times a week   Frequency of Social Gatherings with Friends and Family: More than three  times a week   Attends Religious Services: More than 4 times per year   Active Member of [employer] or Organizations: No   Attends Archivist Meetings: Never   Marital Status: Married  Human resources officer Violence: Not At Risk   Fear of Current or Ex-Partner: No   Emotionally Abused: No   Physically Abused: No   Sexually Abused: No    Outpatient Medications Prior to Visit  Medication Sig Dispense Refill   albuterol (VENTOLIN HFA) 108 (90 Base) MCG/ACT inhaler Inhale 2 puffs into the lungs every 6 (six) hours as needed for wheezing or shortness of breath. 18 g 11   benzonatate (TESSALON) 100 MG capsule TAKE 1 CAPSULE BY MOUTH TWICE DAILY AS NEEDED FOR COUGH 30 capsule 0   calcium carbonate (TUMS - DOSED IN MG ELEMENTAL CALCIUM) 500 MG chewable tablet Chew 1 tablet (200 mg of elemental calcium total) by mouth 3 (three) times daily with meals. 90 tablet 6   cholestyramine (QUESTRAN) 4 GM/DOSE powder DISSOLVE AND TAKE 2 GRAMS BY MOUTH TWICE DAILY WITH A MEAL. DO NOT TAKE WITHIN 2 HOURS OF OTHER MEDICATIONS. 378 g 0   clotrimazole (CLOTRIMAZOLE AF) 1 % cream Apply 1 application topically 2 (two) times daily. To affected area for 1-2 weeks 30 g 0   cyclobenzaprine (FLEXERIL) 10 MG tablet Take 10 mg by mouth as needed for muscle spasms.      dexlansoprazole (DEXILANT) 60 MG capsule 1 PO EVERY MORNING WITH BREAKFAST. 90 capsule 3   diazepam (VALIUM) 5 MG tablet Take 5 mg by mouth every 12 (twelve) hours as needed for anxiety.     fluticasone (FLONASE) 50 MCG/ACT nasal spray USE TWO SPRAY(S) IN EACH NOSTRIL ONCE DAILY AS NEEDED FOR ALLERGY OR RHINITIS 16 g 2   gabapentin (NEURONTIN) 400 MG capsule Take 1 capsule (400 mg total) by mouth 4 (four) times daily. (Patient taking differently: Take 400 mg by mouth 3 (three) times daily.)     lactase (LACTAID) 3000 units tablet Take 3,000 Units by mouth daily  as needed (lactose intolerance).     levothyroxine (SYNTHROID) 25 MCG tablet Take 1 tablet (25 mcg  total) by mouth daily. 30 tablet 2   Lidocaine HCl 4 % LIQD Apply 1 application topically 3 (three) times daily as needed (pain).     loratadine (CLARITIN) 10 MG tablet Take 1 tablet (10 mg total) by mouth daily. 30 tablet 3   magnesium hydroxide (MILK OF MAGNESIA) 400 MG/5ML suspension Take 30 mLs by mouth daily as needed for mild constipation.     Menthol 10 % AERO Apply 1 application topically 3 (three) times daily as needed (pain).     nystatin (MYCOSTATIN) 100000 UNIT/ML suspension TAKE 5 ML BY MOUTH  4 TIMES DAILY AS NEEDED FOR THRUSH 500 mL 0   oxyCODONE-acetaminophen (PERCOCET) 10-325 MG tablet Take 1 tablet by mouth every 6 (six) hours as needed for pain.     SYMBICORT 80-4.5 MCG/ACT inhaler Inhale 2 puffs into the lungs 2 (two) times daily. 11 g 11   triamcinolone (KENALOG) 0.1 % Apply 1 application topically 2 (two) times daily. 30 g 0   UNABLE TO FIND Capsule to rebuild cells-BID     UNABLE TO FIND as needed. Penetrex cream     valACYclovir (VALTREX) 1000 MG tablet Take 1 tablet (1,000 mg total) by mouth 2 (two) times daily. 14 tablet 0   amLODipine (NORVASC) 5 MG tablet Take 1 tablet by mouth once daily 90 tablet 0   diazepam (VALIUM) 5 MG tablet diazepam 5 mg tablet  Take 1 tablet twice a day by oral route as needed.     predniSONE (STERAPRED UNI-PAK 21 TAB) 10 MG (21) TBPK tablet Take as package instructions. 1 each 0   No facility-administered medications prior to visit.    Allergies  Allergen Reactions   Naproxen Shortness Of Breath    Chest pain   Dicyclomine Hcl Other (See Comments)    Vision changes   Duloxetine Other (See Comments)    Suicidal    Eggs Or Egg-Derived Products Diarrhea    abd pain   Lovastatin Other (See Comments)    Chest pain   Toradol [Ketorolac Tromethamine] Other (See Comments)    Headache. Non-effective   Tramadol Other (See Comments)    Headache. Non-effective.   Nexium [Esomeprazole Magnesium] Diarrhea    Abdominal pain     ROS Review of Systems  Constitutional:  Negative for chills and fever.  HENT:  Negative for congestion, sinus pressure, sinus pain and sore throat.   Eyes:  Negative for pain and discharge.  Respiratory:  Negative for cough and shortness of breath.   Cardiovascular:  Negative for chest pain and palpitations.  Gastrointestinal:  Positive for abdominal pain (chronic). Negative for constipation, diarrhea, nausea and vomiting.  Endocrine: Negative for polydipsia and polyuria.  Genitourinary:  Negative for dysuria and hematuria.  Musculoskeletal:  Positive for arthralgias, back pain and neck pain. Negative for neck stiffness.  Skin:  Negative for rash.  Neurological:  Positive for numbness. Negative for dizziness and weakness.  Psychiatric/Behavioral:  Negative for agitation and behavioral problems. The patient is nervous/anxious.      Objective:    Physical Exam Vitals reviewed.  Constitutional:      General: She is not in acute distress.    Appearance: She is not diaphoretic.  HENT:     Head: Normocephalic and atraumatic.     Nose: Nose normal.     Mouth/Throat:     Mouth: Mucous membranes are moist.  Eyes:     General: No scleral icterus.    Extraocular Movements: Extraocular movements intact.  Cardiovascular:     Rate and Rhythm: Normal rate and regular rhythm.     Pulses: Normal pulses.     Heart sounds: Normal heart sounds. No murmur heard. Pulmonary:     Breath sounds: Normal breath sounds. No wheezing or rales.  Abdominal:     Palpations: Abdomen is soft.     Tenderness: There is abdominal tenderness (Mild, generalized).  Musculoskeletal:        General: Tenderness (Lumbar spine area) present.     Cervical back: Neck supple. No tenderness.     Right lower leg: No edema.     Left lower leg: No edema.  Skin:    General: Skin is warm.  Neurological:     General: No focal deficit present.     Mental Status: She is alert and oriented to person, place, and time.   Psychiatric:        Mood and Affect: Mood normal.        Behavior: Behavior normal.    BP (!) 154/99 (BP Location: Left Arm, Patient Position: Sitting, Cuff Size: Large)   Pulse (!) 108   Temp 98.3 F (36.8 C) (Oral)   Ht 5' (1.524 m)   Wt 111 lb 1.3 oz (50.4 kg)   SpO2 94%   BMI 21.69 kg/m  Wt Readings from Last 3 Encounters:  02/14/21 111 lb 1.3 oz (50.4 kg)  01/24/21 115 lb 0.6 oz (52.2 kg)  12/13/20 111 lb (50.3 kg)     Health Maintenance Due  Topic Date Due   COVID-19 Vaccine (1) Never done   PAP SMEAR-Modifier  04/02/2020    There are no preventive care reminders to display for this patient.  Lab Results  Component Value Date   TSH 1.110 02/09/2021   Lab Results  Component Value Date   WBC 4.6 02/09/2021   HGB 18.1 (H) 02/09/2021   HCT 51.8 (H) 02/09/2021   MCV 103 (H) 02/09/2021   PLT 197 02/09/2021   Lab Results  Component Value Date   NA 139 02/09/2021   K 3.5 02/09/2021   CO2 22 02/09/2021   GLUCOSE 91 02/09/2021   BUN 7 (L) 02/09/2021   CREATININE 0.65 02/09/2021   BILITOT 0.7 02/09/2021   ALKPHOS 114 02/09/2021   AST 49 (H) 02/09/2021   ALT 38 (H) 02/09/2021   PROT 6.8 02/09/2021   ALBUMIN 4.3 02/09/2021   CALCIUM 9.7 02/09/2021   ANIONGAP 12 01/31/2020   EGFR 99 02/09/2021   Lab Results  Component Value Date   CHOL 239 (H) 02/09/2021   Lab Results  Component Value Date   HDL 140 02/09/2021   Lab Results  Component Value Date   LDLCALC 84 02/09/2021   Lab Results  Component Value Date   TRIG 93 02/09/2021   Lab Results  Component Value Date   CHOLHDL 1.7 02/09/2021   No results found for: HGBA1C    Assessment & Plan:   Problem List Items Addressed This Visit       Cardiovascular and Mediastinum   Essential hypertension    BP Readings from Last 1 Encounters:  02/14/21 (!) 154/99  Uncontrolled with Amlodipine 5 mg QD, pain could be contributing Increased Amlodipine dose to 10 mg QD Counseled for compliance with  the medications Advised DASH diet and moderate exercise/walking, at least 150 mins/week       Relevant Medications  amLODipine (NORVASC) 10 MG tablet     Digestive   Crohn's disease of small intestine with complication (HCC)    S/p partial bowel resection F/u with GI Has tried multiple medications in the past Was recently given Bentyl, but stopped taking it as she was having vision difficulty with it. Needs to contact GI. On chronic pain medications - on Cymbalta now Referred to [facility] pain clinic for second opinion        Endocrine   Hypothyroidism - Primary    Lab Results  Component Value Date   TSH 1.110 02/09/2021  Now controlled with Levothyroxine 25 mcg QD Check TSH and free T4 in the next visit        Other   Chronic pain syndrome    From chronic back pain and IBD On Gabapentin and Cymbalta F/u with Dr Merlene Laughter, states that she has been taken off of her opioid medication and she has been in severe back and abdominal pain Referred to [facility] pain clinic for second opinion      Polycythemia    Chronic elevated Hb, likely due to her smoking history Will check CBC periodically      Chronic low back pain    Acute on chronic low back pain She had MRI of lumbar spine yesterday, report not available to review yet F/u with Spine surgery      Relevant Orders   Ambulatory referral to Pain Clinic    Meds ordered this encounter  Medications   amLODipine (NORVASC) 10 MG tablet    Sig: Take 1 tablet (10 mg total) by mouth daily.    Dispense:  90 tablet    Refill:  1    Follow-up: Return in about 4 months (around 06/17/2021).    Lindell Spar, MD

## 2021-02-14 NOTE — Assessment & Plan Note (Signed)
S/p partial bowel resection F/u with GI Has tried multiple medications in the past Was recently given Bentyl, but stopped taking it as she was having vision difficulty with it. Needs to contact GI. On chronic pain medications - on Cymbalta now Referred to [facility] pain clinic for second opinion

## 2021-02-14 NOTE — Assessment & Plan Note (Signed)
Lab Results  Component Value Date   TSH 1.110 02/09/2021   Now controlled with Levothyroxine 25 mcg QD Check TSH and free T4 in the next visit

## 2021-02-14 NOTE — Assessment & Plan Note (Signed)
Acute on chronic low back pain She had MRI of lumbar spine yesterday, report not available to review yet F/u with Spine surgery

## 2021-02-14 NOTE — Assessment & Plan Note (Signed)
Chronic elevated Hb, likely due to her smoking history Will check CBC periodically

## 2021-02-14 NOTE — Assessment & Plan Note (Signed)
BP Readings from Last 1 Encounters:  02/14/21 (!) 154/99   Uncontrolled with Amlodipine 5 mg QD, pain could be contributing Increased Amlodipine dose to 10 mg QD Counseled for compliance with the medications Advised DASH diet and moderate exercise/walking, at least 150 mins/week

## 2021-04-16 ENCOUNTER — Other Ambulatory Visit: Payer: Self-pay | Admitting: Internal Medicine

## 2021-04-16 DIAGNOSIS — J302 Other seasonal allergic rhinitis: Secondary | ICD-10-CM

## 2021-04-16 DIAGNOSIS — E039 Hypothyroidism, unspecified: Secondary | ICD-10-CM

## 2021-04-25 ENCOUNTER — Other Ambulatory Visit: Payer: Self-pay

## 2021-04-25 MED ORDER — ALBUTEROL SULFATE HFA 108 (90 BASE) MCG/ACT IN AERS: 2.0000 | INHALATION_SPRAY | Freq: Four times a day (QID) | RESPIRATORY_TRACT | 11 refills | Status: DC | PRN

## 2021-05-01 ENCOUNTER — Ambulatory Visit (INDEPENDENT_AMBULATORY_CARE_PROVIDER_SITE_OTHER): Payer: Medicare Other | Admitting: Internal Medicine

## 2021-05-01 ENCOUNTER — Encounter: Payer: Self-pay | Admitting: Internal Medicine

## 2021-05-01 ENCOUNTER — Telehealth: Payer: Self-pay | Admitting: Internal Medicine

## 2021-05-01 ENCOUNTER — Other Ambulatory Visit: Payer: Self-pay

## 2021-05-01 VITALS — BP 136/76 | HR 97 | Resp 16 | Ht 60.0 in | Wt 114.0 lb

## 2021-05-01 DIAGNOSIS — G894 Chronic pain syndrome: Secondary | ICD-10-CM | POA: Diagnosis not present

## 2021-05-01 MED ORDER — OXYCODONE-ACETAMINOPHEN 5-325 MG PO TABS
1.0000 | ORAL_TABLET | Freq: Two times a day (BID) | ORAL | 0 refills | Status: DC | PRN
Start: 2021-05-01 — End: 2021-05-09

## 2021-05-01 NOTE — Telephone Encounter (Signed)
Please call regarding Pain Management

## 2021-05-01 NOTE — Assessment & Plan Note (Signed)
From chronic back pain and IBD On Gabapentin and Cymbalta Used to f/u with Dr Merlene Laughter, states that she has been taken off of her opioid medication and she has been in severe back and abdominal pain Sent Oxycodone-Acetaminophen 5-325 mg q12h PRN for now Referred to [facility] pain clinic for second opinion

## 2021-05-01 NOTE — Progress Notes (Signed)
Acute Office Visit  Subjective:    Patient ID: [name] [name], female    DOB: March 20, 1956, 65 y.o.   MRN: [id number]  Chief Complaint  Patient presents with   Back Pain    Pt has been having back pain/hip pain for several months now has been seen for same problem doesn't see [place] anymore     HPI Patient is in today for evaluation of chronic low back pain and hip pain, which is severe, 8-10/10, worse with movement and walking. She was on oxycodone-acetaminophen 10/325 mg q6h in the past for chronic pain syndrome and used to follow-up with [facility] neurology.  She was placed on Cymbalta instead and was weaned off of oxycodone.  She has been in severe pain since then and has crying affect.  She was referred to [facility] pain clinic, but she could not keep the appointment as she was sick on the day.  She has had lumbar fusion surgery in the past, and recently saw her spine surgeon as well.  She has chronic, intermittent numbness of the LE.  Denies any recent injury or fall.  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   Crohn disease ([place])    CTS (carpal tunnel syndrome)    Diarrhea MULITIFACTORIAL   IBS, LACTOSE INTOLERANCE, SBBO, BILE-SALT   Elevated liver enzymes 2009: BMI 24 ?Etoh ALT 94, AST 51,  NEG ANA, qIGs& ASMA   MAY 2012 AST 52 ALT 38   Esophageal stricture 2009   GERD (gastroesophageal reflux disease)    Hemorrhoid    Hyperlipemia    Hypertension    Inflammatory bowel disease 2006 CD   SURGICAL REMISSION   LBP (low back pain)    Mental disorder    Migraine    Osteoporosis    PONV (postoperative nausea and vomiting)    Shortness of breath    exertion and humidity   Shoulder pain    right   Sleep apnea    Stop Bang Cock score of 4    Past Surgical History:  Procedure Laterality  Date   BACK SURGERY     BACK SURGERY  07/16/2018   BILATERAL SALPINGOOPHORECTOMY     BIOPSY  12/24/2011   Procedure: BIOPSY;  Surgeon: Danie Binder, MD;  Location: [facility];  Service: Endoscopy;;  Gastric Biopsies   BIOPSY N/A 06/30/2012   Procedure: BIOPSY;  Surgeon: Danie Binder, MD;  Location: [facility];  Service: Endoscopy;  Laterality: N/A;  Gastric and Esophageal Biopsies   CARPAL TUNNEL RELEASE  LEFT   CATARACT EXTRACTION W/PHACO Right 07/30/2016   Procedure: CATARACT EXTRACTION PHACO AND INTRAOCULAR LENS PLACEMENT ([name]);  Surgeon: Rutherford Guys, MD;  Location: [facility];  Service: Ophthalmology;  Laterality: Right;  CDE: 5.45   CATARACT EXTRACTION W/PHACO Left 08/13/2016   Procedure: CATARACT EXTRACTION PHACO AND INTRAOCULAR LENS PLACEMENT (I[facility]);  Surgeon: Rutherford Guys, MD;  Location: [facility];  Service: Ophthalmology;  Laterality: Left;  CDE: 5.59   COLON SURGERY     COLONOSCOPY  JAN 2009 DIARRHEA, ABD PAIN, BRBPR   ANASTOMOTIC ULCER(3MM), IH YI:FOYDXA ULCER, NL COLON bX   COLONOSCOPY WITH PROPOFOL N/A 04/21/2018   examined portion of the ileum normal, two 3-5 mm polyps, diverticulosis  in the rectosigmoid and sigmoid colon, external and internal hemorrhoids, significant looping of the colon.  Pathology with tubular adenomas.  Recommendations to follow high-fiber diet and repeat colonoscopy in 5 to 10 years with peds colonoscope.   DILATION AND CURETTAGE OF UTERUS     ESOPHAGEAL DILATION  12/24/2011   SLF: A stricture was found in the distal esophagus/ Moderate gastritis/ MILD Duodenitis   ESOPHAGOGASTRODUODENOSCOPY  02/07/09   mild gastritis   ESOPHAGOGASTRODUODENOSCOPY (EGD) WITH PROPOFOL N/A 06/30/2012   SLF: 1. No definite stricture appreciated 2. small hiatal hernia 3. Gastritis   ESOPHAGOGASTRODUODENOSCOPY (EGD) WITH PROPOFOL N/A 02/20/2016   Procedure: ESOPHAGOGASTRODUODENOSCOPY (EGD) WITH PROPOFOL;  Surgeon: Danie Binder, MD;  [facility];  Service: Endoscopy;  Laterality:  N/A;  930   ESOPHAGOGASTRODUODENOSCOPY (EGD) WITH PROPOFOL N/A 11/24/2018   benign-appearing esophageal peptic stricture s/p dilation, small hiatal hernia, moderate erosive gastritis.    EYE SURGERY     gum flap Bilateral    HEMICOLECTOMY  RIGHT 2006   HERNIA REPAIR     LUMBAR DISC SURGERY     POLYPECTOMY  04/21/2018   Procedure: POLYPECTOMY;  Surgeon: Danie Binder, MD;  [facility];  Service: Endoscopy;;  (colon)   SAVORY DILATION N/A 06/30/2012   Procedure: SAVORY DILATION;  Surgeon: Danie Binder, MD;  Location: [facility];  Service: Endoscopy;  Laterality: N/A;  12.51m , 149m 1573m SAVORY DILATION N/A 02/20/2016   Procedure: SAVORY DILATION;  Surgeon: SanDanie BinderD;  [facility];  Service: Endoscopy;  Laterality: N/A;   SAVORY DILATION N/A 11/24/2018   Procedure: SAVORY DILATION;  Surgeon: FieDanie BinderD;  [facility];  Service: Endoscopy;  Laterality: N/A;   UPPER GASTROINTESTINAL ENDOSCOPY  JAN 2009 ABD PAIN   GASTRITIS, ESO RING   UPPER GASTROINTESTINAL ENDOSCOPY  OCT 2010 ABD PAIN, DYSPHAGIA   DIL 15MM, GASTRITIS, NL DUODENUM   UPPER GASTROINTESTINAL ENDOSCOPY  OCT 2010 DYSPHAGIA   DIL 17 MM, GASTRITIS, NL DUODENUM   vocal cord surgery  Sep 20, 2010   precancerous areas removed   wisdom tooth extraction Right    pin placement due to broken jaw    Family History  Problem Relation Age of Onset   Hypothyroidism Mother    Heart disease Father    Parkinson's disease Father    Hypothyroidism Daughter    Heart attack Paternal Grandfather    Alzheimer's disease Paternal Grandmother    Heart attack Maternal Grandfather    Crohn's disease Sister    Other Sister        was murdered   Crohn's disease Maternal Uncle    Colon cancer Neg Hx    Colon polyps Neg Hx     Social History   Socioeconomic History   Marital status: Single    Spouse name: WayPatrick JupiterNumber of children: 2   Years of education: GED   Highest education level:  Not on file  Occupational History    Comment: disabled  Tobacco Use   Smoking status: Every Day    Packs/day: 0.25    Years: 30.00    Pack years: 7.50    Types: Cigarettes   Smokeless tobacco: Never  Vaping Use   Vaping Use: Never used  Substance and Sexual Activity   Alcohol use: Not Currently    Comment: wine/beer a few times a week    Drug use: No   Sexual activity: Not Currently  Birth control/protection: Post-menopausal  Other Topics Concern   Not on file  Social History Narrative   Lives with husband   no caffiene   Social Determinants of Health   Financial Resource Strain: Low Risk    Difficulty of Paying Living Expenses: Not hard at all  Food Insecurity: No Food Insecurity   Worried About Charity fundraiser in the Last Year: Never true   Arboriculturist in the Last Year: Never true  Transportation Needs: No Transportation Needs   Lack of Transportation (Medical): No   Lack of Transportation (Non-Medical): No  Physical Activity: Insufficiently Active   Days of Exercise per Week: 3 days   Minutes of Exercise per Session: 30 min  Stress: No Stress Concern Present   Feeling of Stress : Not at all  Social Connections: Moderately Integrated   Frequency of Communication with Friends and Family: More than three times a week   Frequency of Social Gatherings with Friends and Family: More than three times a week   Attends Religious Services: More than 4 times per year   Active Member of [employer] or Organizations: No   Attends Archivist Meetings: Never   Marital Status: Married  Human resources officer Violence: Not At Risk   Fear of Current or Ex-Partner: No   Emotionally Abused: No   Physically Abused: No   Sexually Abused: No    Outpatient Medications Prior to Visit  Medication Sig Dispense Refill   albuterol (VENTOLIN HFA) 108 (90 Base) MCG/ACT inhaler Inhale 2 puffs into the lungs every 6 (six) hours as needed for wheezing or shortness of breath. 18 g 11    amLODipine (NORVASC) 10 MG tablet Take 1 tablet (10 mg total) by mouth daily. 90 tablet 1   benzonatate (TESSALON) 100 MG capsule TAKE 1 CAPSULE BY MOUTH TWICE DAILY AS NEEDED FOR COUGH 30 capsule 0   calcium carbonate (TUMS - DOSED IN MG ELEMENTAL CALCIUM) 500 MG chewable tablet Chew 1 tablet (200 mg of elemental calcium total) by mouth 3 (three) times daily with meals. 90 tablet 6   cholestyramine (QUESTRAN) 4 GM/DOSE powder DISSOLVE AND TAKE 2 GRAMS BY MOUTH TWICE DAILY WITH A MEAL. DO NOT TAKE WITHIN 2 HOURS OF OTHER MEDICATIONS. 378 g 0   clotrimazole (CLOTRIMAZOLE AF) 1 % cream Apply 1 application topically 2 (two) times daily. To affected area for 1-2 weeks 30 g 0   cyclobenzaprine (FLEXERIL) 10 MG tablet Take 10 mg by mouth as needed for muscle spasms.      dexlansoprazole (DEXILANT) 60 MG capsule 1 PO EVERY MORNING WITH BREAKFAST. 90 capsule 3   diazepam (VALIUM) 5 MG tablet Take 5 mg by mouth every 12 (twelve) hours as needed for anxiety.     fluticasone (FLONASE) 50 MCG/ACT nasal spray USE TWO SPRAY(S) IN EACH NOSTRIL ONCE DAILY AS NEEDED FOR ALLERGY OR RHINITIS 16 g 2   gabapentin (NEURONTIN) 400 MG capsule Take 1 capsule (400 mg total) by mouth 4 (four) times daily. (Patient taking differently: Take 400 mg by mouth 3 (three) times daily.)     lactase (LACTAID) 3000 units tablet Take 3,000 Units by mouth daily as needed (lactose intolerance).     levothyroxine (SYNTHROID) 25 MCG tablet Take 1 tablet by mouth once daily 30 tablet 0   Lidocaine HCl 4 % LIQD Apply 1 application topically 3 (three) times daily as needed (pain).     loratadine (CLARITIN) 10 MG tablet Take 1 tablet (10 mg total)  by mouth daily. 30 tablet 3   magnesium hydroxide (MILK OF MAGNESIA) 400 MG/5ML suspension Take 30 mLs by mouth daily as needed for mild constipation.     Menthol 10 % AERO Apply 1 application topically 3 (three) times daily as needed (pain).     nystatin (MYCOSTATIN) 100000 UNIT/ML suspension TAKE 5 ML  BY MOUTH  4 TIMES DAILY AS NEEDED FOR THRUSH 500 mL 0   SYMBICORT 80-4.5 MCG/ACT inhaler Inhale 2 puffs into the lungs 2 (two) times daily. 11 g 11   triamcinolone (KENALOG) 0.1 % Apply 1 application topically 2 (two) times daily. 30 g 0   UNABLE TO FIND Capsule to rebuild cells-BID     UNABLE TO FIND as needed. Penetrex cream     valACYclovir (VALTREX) 1000 MG tablet Take 1 tablet (1,000 mg total) by mouth 2 (two) times daily. 14 tablet 0   oxyCODONE-acetaminophen (PERCOCET) 10-325 MG tablet Take 1 tablet by mouth every 6 (six) hours as needed for pain.     No facility-administered medications prior to visit.    Allergies  Allergen Reactions   Naproxen Shortness Of Breath    Chest pain   Dicyclomine Hcl Other (See Comments)    Vision changes   Duloxetine Other (See Comments)    Suicidal    Eggs Or Egg-Derived Products Diarrhea    abd pain   Lovastatin Other (See Comments)    Chest pain   Toradol [Ketorolac Tromethamine] Other (See Comments)    Headache. Non-effective   Tramadol Other (See Comments)    Headache. Non-effective.   Nexium [Esomeprazole Magnesium] Diarrhea    Abdominal pain    Review of Systems  Constitutional:  Negative for chills and fever.  HENT:  Negative for congestion, sinus pressure, sinus pain and sore throat.   Eyes:  Negative for pain and discharge.  Respiratory:  Negative for cough and shortness of breath.   Cardiovascular:  Negative for chest pain and palpitations.  Gastrointestinal:  Positive for abdominal pain (chronic). Negative for constipation, diarrhea, nausea and vomiting.  Endocrine: Negative for polydipsia and polyuria.  Genitourinary:  Negative for dysuria and hematuria.  Musculoskeletal:  Positive for arthralgias, back pain and neck pain. Negative for neck stiffness.  Skin:  Negative for rash.  Neurological:  Positive for numbness. Negative for dizziness and weakness.  Psychiatric/Behavioral:  Negative for agitation and behavioral  problems. The patient is nervous/anxious.       Objective:    Physical Exam Vitals reviewed.  Constitutional:      General: She is not in acute distress.    Appearance: She is not diaphoretic.  HENT:     Head: Normocephalic and atraumatic.     Nose: Nose normal.     Mouth/Throat:     Mouth: Mucous membranes are moist.  Eyes:     General: No scleral icterus.    Extraocular Movements: Extraocular movements intact.  Cardiovascular:     Rate and Rhythm: Normal rate and regular rhythm.     Pulses: Normal pulses.     Heart sounds: Normal heart sounds. No murmur heard. Pulmonary:     Breath sounds: Normal breath sounds. No wheezing or rales.  Abdominal:     Palpations: Abdomen is soft.     Tenderness: There is abdominal tenderness (Mild, generalized).  Musculoskeletal:        General: Tenderness (Lumbar spine area) present.     Cervical back: Neck supple. No tenderness.     Right lower leg: No edema.  Left lower leg: No edema.  Skin:    General: Skin is warm.  Neurological:     General: No focal deficit present.     Mental Status: She is alert and oriented to person, place, and time.  Psychiatric:        Mood and Affect: Mood normal.        Behavior: Behavior normal.    BP 136/76 (BP Location: Right Arm, Patient Position: Sitting, Cuff Size: Normal)    Pulse 97    Resp 16    Ht 5' (1.524 m)    Wt 114 lb (51.7 kg)    SpO2 93%    BMI 22.26 kg/m  Wt Readings from Last 3 Encounters:  05/01/21 114 lb (51.7 kg)  02/14/21 111 lb 1.3 oz (50.4 kg)  01/24/21 115 lb 0.6 oz (52.2 kg)        Assessment & Plan:   Problem List Items Addressed This Visit       Other   Chronic pain syndrome - Primary    From chronic back pain and IBD On Gabapentin and Cymbalta Used to f/u with Dr Merlene Laughter, states that she has been taken off of her opioid medication and she has been in severe back and abdominal pain Sent Oxycodone-Acetaminophen 5-325 mg q12h PRN for now Referred to [facility] pain clinic for second opinion      Relevant Medications   oxyCODONE-acetaminophen (PERCOCET/ROXICET) 5-325 MG tablet     Meds ordered this encounter  Medications   oxyCODONE-acetaminophen (PERCOCET/ROXICET) 5-325 MG tablet    Sig: Take 1 tablet by mouth 2 (two) times daily as needed for severe pain.    Dispense:  30 tablet    Refill:  0     Harlene Petralia Keith Rake, MD

## 2021-05-01 NOTE — Patient Instructions (Signed)
Please start taking Oxycodone-Acetamenophen as prescribed.  Please follow up with Pain specialist as scheduled.

## 2021-05-07 ENCOUNTER — Telehealth: Payer: Self-pay

## 2021-05-07 NOTE — Telephone Encounter (Signed)
Needs rx refill for pain meds - Percocet and she said she ned Gabapentin but I did not see that in her med list.

## 2021-05-08 ENCOUNTER — Telehealth: Payer: Self-pay | Admitting: Internal Medicine

## 2021-05-08 NOTE — Telephone Encounter (Signed)
Pt called in for refill on percocet

## 2021-05-09 ENCOUNTER — Telehealth: Payer: Self-pay

## 2021-05-09 ENCOUNTER — Other Ambulatory Visit: Payer: Self-pay | Admitting: Internal Medicine

## 2021-05-09 ENCOUNTER — Other Ambulatory Visit: Payer: Self-pay | Admitting: *Deleted

## 2021-05-09 DIAGNOSIS — G894 Chronic pain syndrome: Secondary | ICD-10-CM

## 2021-05-09 MED ORDER — OXYCODONE-ACETAMINOPHEN 5-325 MG PO TABS: 1.0000 | ORAL_TABLET | Freq: Two times a day (BID) | ORAL | 0 refills | Status: DC | PRN

## 2021-05-09 MED ORDER — GABAPENTIN 400 MG PO CAPS: 400.0000 mg | ORAL_CAPSULE | Freq: Four times a day (QID) | ORAL | 0 refills | Status: DC

## 2021-05-09 NOTE — Telephone Encounter (Signed)
Pt advised to check with pharmacy as 30 were sent on [id number]

## 2021-05-09 NOTE — Telephone Encounter (Signed)
Medication sent to pharmacy  

## 2021-05-09 NOTE — Telephone Encounter (Signed)
Patient called said she is out of percocet only 10 was sent in and needs enough to get until appt February.

## 2021-05-09 NOTE — Telephone Encounter (Signed)
Patient called the pharmacy has not received the other 30 pills for Percocet. Asked for the provider to send in to the pharmacy again.  Pharm[facility]:  [facility]

## 2021-05-09 NOTE — Telephone Encounter (Signed)
Patient called need med refill Gabapentin 600 mg.    [place]

## 2021-05-15 ENCOUNTER — Other Ambulatory Visit: Payer: Self-pay | Admitting: Internal Medicine

## 2021-05-15 DIAGNOSIS — E039 Hypothyroidism, unspecified: Secondary | ICD-10-CM

## 2021-05-15 DIAGNOSIS — J302 Other seasonal allergic rhinitis: Secondary | ICD-10-CM

## 2021-05-21 ENCOUNTER — Other Ambulatory Visit: Payer: Self-pay | Admitting: Internal Medicine

## 2021-05-21 ENCOUNTER — Telehealth: Payer: Self-pay | Admitting: Internal Medicine

## 2021-05-21 DIAGNOSIS — G894 Chronic pain syndrome: Secondary | ICD-10-CM

## 2021-05-21 MED ORDER — OXYCODONE-ACETAMINOPHEN 5-325 MG PO TABS: 1.0000 | ORAL_TABLET | Freq: Two times a day (BID) | ORAL | 0 refills | Status: DC | PRN

## 2021-05-21 NOTE — Telephone Encounter (Signed)
Pt called in for refill on   oxyCODONE-acetaminophen (PERCOCET/ROXICET) 5-325 MG tablet   Pt states she has bad tooth and has to take antibiotic from dentist Dr.Wheless   Pt also has been taking med 3 x daily due to the tooth pain   Pt will be out of med soon

## 2021-05-21 NOTE — Telephone Encounter (Signed)
Pt advised she does have appt with pain clinic in [place] advised if she takes more than prescribed we can no longer fill it she stated she only took it 3 times because of the tooth pain advised her this was not written because of tooth pain and should only be taken as directed with verbal understanding

## 2021-05-22 ENCOUNTER — Telehealth: Payer: Self-pay | Admitting: Internal Medicine

## 2021-05-22 NOTE — Telephone Encounter (Signed)
Please have pt have pharmacy send [place] the prior auth request I have not received this yet

## 2021-05-22 NOTE — Telephone Encounter (Signed)
Pt called in regard to pain meds per last tele   Pt states that she can not receive meds from pharm without provider authorization  Prescription pick up is early

## 2021-06-04 NOTE — Telephone Encounter (Signed)
Pt called in to see about pain medicine being sent to pharmacy states that she called previously. Asked pt if she had reached out to pharmacy to have prior auth sent she states that she did not but will contact them to do so. States that she doesn't see her pain doctor anymore.

## 2021-06-04 NOTE — Telephone Encounter (Signed)
Contacted pt back she states that she just got off the phone with the pharmacy and they will send [place] a release for a new prescription

## 2021-06-04 NOTE — Telephone Encounter (Signed)
This was already corrected and sent back to pharmacy.

## 2021-06-04 NOTE — Telephone Encounter (Signed)
noted 

## 2021-06-05 NOTE — Telephone Encounter (Signed)
Please call the pt regarding the medication

## 2021-06-07 ENCOUNTER — Other Ambulatory Visit: Payer: Self-pay | Admitting: Internal Medicine

## 2021-06-07 DIAGNOSIS — G894 Chronic pain syndrome: Secondary | ICD-10-CM

## 2021-06-07 MED ORDER — OXYCODONE-ACETAMINOPHEN 5-325 MG PO TABS: 1.0000 | ORAL_TABLET | Freq: Two times a day (BID) | ORAL | 0 refills | Status: DC | PRN

## 2021-06-07 NOTE — Telephone Encounter (Signed)
Pt advised with verbal understanding she has pain management appt 2/7 will keep appt and follow up with [place]

## 2021-06-07 NOTE — Telephone Encounter (Signed)
Patient came by the office and said her Percocet 5 mg was not sent into her pharmacy yet to [facility].  Patient says she in a lot of pain. Please contact patient at [phone] and let her know if not can be filled.

## 2021-06-08 ENCOUNTER — Other Ambulatory Visit: Payer: Self-pay | Admitting: Internal Medicine

## 2021-06-18 ENCOUNTER — Other Ambulatory Visit: Payer: Self-pay

## 2021-06-18 ENCOUNTER — Ambulatory Visit (INDEPENDENT_AMBULATORY_CARE_PROVIDER_SITE_OTHER): Payer: Medicare Other | Admitting: Internal Medicine

## 2021-06-18 ENCOUNTER — Encounter: Payer: Self-pay | Admitting: Internal Medicine

## 2021-06-18 VITALS — BP 116/68 | HR 115 | Resp 18 | Ht 60.0 in | Wt 110.1 lb

## 2021-06-18 DIAGNOSIS — Z0001 Encounter for general adult medical examination with abnormal findings: Secondary | ICD-10-CM

## 2021-06-18 DIAGNOSIS — G894 Chronic pain syndrome: Secondary | ICD-10-CM

## 2021-06-18 DIAGNOSIS — E039 Hypothyroidism, unspecified: Secondary | ICD-10-CM

## 2021-06-18 DIAGNOSIS — Z981 Arthrodesis status: Secondary | ICD-10-CM

## 2021-06-18 DIAGNOSIS — Z2821 Immunization not carried out because of patient refusal: Secondary | ICD-10-CM | POA: Diagnosis not present

## 2021-06-18 DIAGNOSIS — I10 Essential (primary) hypertension: Secondary | ICD-10-CM

## 2021-06-18 DIAGNOSIS — Z720 Tobacco use: Secondary | ICD-10-CM

## 2021-06-18 DIAGNOSIS — F17210 Nicotine dependence, cigarettes, uncomplicated: Secondary | ICD-10-CM

## 2021-06-18 DIAGNOSIS — Z114 Encounter for screening for human immunodeficiency virus [HIV]: Secondary | ICD-10-CM

## 2021-06-18 DIAGNOSIS — K50019 Crohn's disease of small intestine with unspecified complications: Secondary | ICD-10-CM

## 2021-06-18 DIAGNOSIS — J410 Simple chronic bronchitis: Secondary | ICD-10-CM | POA: Diagnosis not present

## 2021-06-18 DIAGNOSIS — Z0000 Encounter for general adult medical examination without abnormal findings: Secondary | ICD-10-CM

## 2021-06-18 NOTE — Assessment & Plan Note (Signed)
Lab Results  Component Value Date   TSH 1.110 02/09/2021   Now controlled with Levothyroxine 25 mcg QD Check TSH and free T4 in the next visit

## 2021-06-18 NOTE — Assessment & Plan Note (Signed)
S/p partial bowel resection F/u with GI Has tried multiple medications in the past Was recently given Bentyl, but stopped taking it as she was having vision difficulty with it. Referred to [facility] pain clinic for second opinion

## 2021-06-18 NOTE — Assessment & Plan Note (Signed)
Smokes about 3 to 4 cigarettes/day  Asked about quitting: confirms that she currently smokes cigarettes Advise to quit smoking: Educated about QUITTING to reduce the risk of cancer, cardio and cerebrovascular disease. Assess willingness: Unwilling to quit at this time, but is working on cutting back. Assist with counseling and pharmacotherapy: Counseled for 5 minutes and literature provided. Arrange for follow up: Follow up in 3 months and continue to offer help.

## 2021-06-18 NOTE — Assessment & Plan Note (Signed)
Continues to smoke about 3-4 cigarettes/day Albuterol PRN

## 2021-06-18 NOTE — Assessment & Plan Note (Signed)
BP Readings from Last 1 Encounters:  06/18/21 116/68   Well-controlled with Amlodipine 10 mg QD Counseled for compliance with the medications Advised DASH diet and moderate exercise/walking, at least 150 mins/week

## 2021-06-18 NOTE — Progress Notes (Signed)
Established Patient Office Visit  Subjective:  Patient ID: MAGUIRE KILLMER, female    DOB: 1955-12-27  Age: 66 y.o. MRN: [id number]  CC:  Chief Complaint  Patient presents with   Follow-up    4 month follow up percocet is not working does have appt with pain management 2-23    HPI Quierra B Mayeda is a 66 y.o. female with past medical history of hypertension, Crohn's disease status post partial bowel resection, hypothyroidism, GERD, DJD of cervical and lumbar spine, s/p lumbar fusion surgery, chronic pain syndrome and tobacco abuse who presents for f/u of her chronic medical conditions.  HTN: BP is well-controlled. Takes medications regularly. Patient denies headache, dizziness, chest pain, dyspnea or palpitations.  She has history of Crohn's disease, but she is not on immunosuppressives. She has tried different regimens in the past. She was given Bentyl recently, but did not tolerate it. She follows up with [facility] gastroenterology. She denies any melena or hematochezia currently, but has chronic abdominal pain. She used to take opioid medications for chronic abdominal pain as well.  She has been tolerating Levothyroxine. Her TSH is well-controlled now. Denies any tremors or palpitations.     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 PFTS   Crohn disease ([place])    CTS (carpal tunnel syndrome)    Diarrhea MULITIFACTORIAL   IBS, LACTOSE INTOLERANCE, SBBO, BILE-SALT   Elevated liver enzymes 2009: BMI 24 ?Etoh ALT 94, AST 51,  NEG ANA, qIGs& ASMA   MAY 2012 AST 52 ALT 38   Esophageal stricture 2009   GERD (gastroesophageal reflux disease)    Hemorrhoid    Hyperlipemia    Hypertension    Inflammatory bowel disease 2006 CD   SURGICAL REMISSION   LBP (low back pain)    Mental disorder    Migraine     Osteoporosis    PONV (postoperative nausea and vomiting)    Shortness of breath    exertion and humidity   Shoulder pain    right   Sleep apnea    Stop Bang Cock score of 4    Past Surgical History:  Procedure Laterality Date   BACK SURGERY     BACK SURGERY  07/16/2018   BILATERAL SALPINGOOPHORECTOMY     BIOPSY  12/24/2011   Procedure: BIOPSY;  Surgeon: Danie Binder, MD;  Location: [facility];  Service: Endoscopy;;  Gastric Biopsies   BIOPSY N/A 06/30/2012   Procedure: BIOPSY;  Surgeon: Danie Binder, MD;  Location: [facility];  Service: Endoscopy;  Laterality: N/A;  Gastric and Esophageal Biopsies   CARPAL TUNNEL RELEASE  LEFT   CATARACT EXTRACTION W/PHACO Right 07/30/2016   Procedure: CATARACT EXTRACTION PHACO AND INTRAOCULAR LENS PLACEMENT ([place]);  Surgeon: Rutherford Guys, MD;  Location: [facility];  Service: Ophthalmology;  Laterality: Right;  CDE: 5.45   CATARACT EXTRACTION W/PHACO Left 08/13/2016   Procedure: CATARACT EXTRACTION PHACO AND INTRAOCULAR LENS PLACEMENT ([facility]);  Surgeon: Rutherford Guys, MD;  Location: [facility];  Service: Ophthalmology;  Laterality: Left;  CDE: 5.59   COLON SURGERY     COLONOSCOPY  JAN 2009 DIARRHEA, ABD PAIN, BRBPR   ANASTOMOTIC ULCER(3MM), IH VQ:XIHWTU ULCER, NL COLON bX   COLONOSCOPY WITH PROPOFOL N/A 04/21/2018  examined portion of the ileum normal, two 3-5 mm polyps, diverticulosis in the rectosigmoid and sigmoid colon, external and internal hemorrhoids, significant looping of the colon.  Pathology with tubular adenomas.  Recommendations to follow high-fiber diet and repeat colonoscopy in 5 to 10 years with peds colonoscope.   DILATION AND CURETTAGE OF UTERUS     ESOPHAGEAL DILATION  12/24/2011   SLF: A stricture was found in the distal esophagus/ Moderate gastritis/ MILD Duodenitis   ESOPHAGOGASTRODUODENOSCOPY  02/07/09   mild gastritis   ESOPHAGOGASTRODUODENOSCOPY (EGD) WITH PROPOFOL N/A 06/30/2012   SLF: 1. No definite stricture appreciated 2. small hiatal hernia  3. Gastritis   ESOPHAGOGASTRODUODENOSCOPY (EGD) WITH PROPOFOL N/A 02/20/2016   Procedure: ESOPHAGOGASTRODUODENOSCOPY (EGD) WITH PROPOFOL;  Surgeon: Danie Binder, MD;  [facility];  Service: Endoscopy;  Laterality: N/A;  930   ESOPHAGOGASTRODUODENOSCOPY (EGD) WITH PROPOFOL N/A 11/24/2018   benign-appearing esophageal peptic stricture s/p dilation, small hiatal hernia, moderate erosive gastritis.    EYE SURGERY     gum flap Bilateral    HEMICOLECTOMY  RIGHT 2006   HERNIA REPAIR     LUMBAR DISC SURGERY     POLYPECTOMY  04/21/2018   Procedure: POLYPECTOMY;  Surgeon: Danie Binder, MD;  [facility];  Service: Endoscopy;;  (colon)   SAVORY DILATION N/A 06/30/2012   Procedure: SAVORY DILATION;  Surgeon: Danie Binder, MD;  Location: [facility];  Service: Endoscopy;  Laterality: N/A;  12.64m , 140m 1554m SAVORY DILATION N/A 02/20/2016   Procedure: SAVORY DILATION;  Surgeon: SanDanie BinderD;  [facility];  Service: Endoscopy;  Laterality: N/A;   SAVORY DILATION N/A 11/24/2018   Procedure: SAVORY DILATION;  Surgeon: FieDanie BinderD;  [facility];  Service: Endoscopy;  Laterality: N/A;   UPPER GASTROINTESTINAL ENDOSCOPY  JAN 2009 ABD PAIN   GASTRITIS, ESO RING   UPPER GASTROINTESTINAL ENDOSCOPY  OCT 2010 ABD PAIN, DYSPHAGIA   DIL 15MM, GASTRITIS, NL DUODENUM   UPPER GASTROINTESTINAL ENDOSCOPY  OCT 2010 DYSPHAGIA   DIL 17 MM, GASTRITIS, NL DUODENUM   vocal cord surgery  Sep 20, 2010   precancerous areas removed   wisdom tooth extraction Right    pin placement due to broken jaw    Family History  Problem Relation Age of Onset   Hypothyroidism Mother    Heart disease Father    Parkinson's disease Father    Hypothyroidism Daughter    Heart attack Paternal Grandfather    Alzheimer's disease Paternal Grandmother    Heart attack Maternal Grandfather    Crohn's disease Sister    Other Sister        was murdered   Crohn's disease Maternal  Uncle    Colon cancer Neg Hx    Colon polyps Neg Hx     Social History   Socioeconomic History   Marital status: Single    Spouse name: WayPatrick JupiterNumber of children: 2   Years of education: GED   Highest education level: Not on file  Occupational History    Comment: disabled  Tobacco Use   Smoking status: Every Day    Packs/day: 0.25    Years: 30.00    Pack years: 7.50    Types: Cigarettes   Smokeless tobacco: Never  Vaping Use   Vaping Use: Never used  Substance and Sexual Activity   Alcohol use: Not Currently    Comment: wine/beer a few times a week  Drug use: No   Sexual activity: Not Currently    Birth control/protection: Post-menopausal  Other Topics Concern   Not on file  Social History Narrative   Lives with husband   no caffiene   Social Determinants of Health   Financial Resource Strain: Low Risk    Difficulty of Paying Living Expenses: Not hard at all  Food Insecurity: No Food Insecurity   Worried About Charity fundraiser in the Last Year: Never true   [place] in the Last Year: Never true  Transportation Needs: No Transportation Needs   Lack of Transportation (Medical): No   Lack of Transportation (Non-Medical): No  Physical Activity: Insufficiently Active   Days of Exercise per Week: 3 days   Minutes of Exercise per Session: 30 min  Stress: No Stress Concern Present   Feeling of Stress : Not at all  Social Connections: Moderately Integrated   Frequency of Communication with Friends and Family: More than three times a week   Frequency of Social Gatherings with Friends and Family: More than three times a week   Attends Religious Services: More than 4 times per year   Active Member of [employer] or Organizations: No   Attends Archivist Meetings: Never   Marital Status: Married  Human resources officer Violence: Not At Risk   Fear of Current or Ex-Partner: No   Emotionally Abused: No   Physically Abused: No   Sexually Abused: No     Outpatient Medications Prior to Visit  Medication Sig Dispense Refill   albuterol (VENTOLIN HFA) 108 (90 Base) MCG/ACT inhaler Inhale 2 puffs into the lungs every 6 (six) hours as needed for wheezing or shortness of breath. 18 g 11   amLODipine (NORVASC) 10 MG tablet Take 1 tablet (10 mg total) by mouth daily. 90 tablet 1   benzonatate (TESSALON) 100 MG capsule TAKE 1 CAPSULE BY MOUTH TWICE DAILY AS NEEDED FOR COUGH 30 capsule 0   calcium carbonate (TUMS - DOSED IN MG ELEMENTAL CALCIUM) 500 MG chewable tablet Chew 1 tablet (200 mg of elemental calcium total) by mouth 3 (three) times daily with meals. 90 tablet 6   cholestyramine (QUESTRAN) 4 GM/DOSE powder DISSOLVE AND TAKE 2 GRAMS BY MOUTH TWICE DAILY WITH A MEAL. DO NOT TAKE WITHIN 2 HOURS OF OTHER MEDICATIONS. 378 g 0   clotrimazole (CLOTRIMAZOLE AF) 1 % cream Apply 1 application topically 2 (two) times daily. To affected area for 1-2 weeks 30 g 0   cyclobenzaprine (FLEXERIL) 10 MG tablet Take 10 mg by mouth as needed for muscle spasms.      dexlansoprazole (DEXILANT) 60 MG capsule 1 PO EVERY MORNING WITH BREAKFAST. 90 capsule 3   diazepam (VALIUM) 5 MG tablet Take 5 mg by mouth every 12 (twelve) hours as needed for anxiety.     fluticasone (FLONASE) 50 MCG/ACT nasal spray USE TWO SPRAY(S) IN EACH NOSTRIL ONCE DAILY AS NEEDED FOR ALLERGY OR RHINITIS 16 g 2   gabapentin (NEURONTIN) 400 MG capsule Take 1 capsule by mouth 4 times daily 90 capsule 0   lactase (LACTAID) 3000 units tablet Take 3,000 Units by mouth daily as needed (lactose intolerance).     levothyroxine (SYNTHROID) 25 MCG tablet Take 1 tablet by mouth once daily 30 tablet 0   Lidocaine HCl 4 % LIQD Apply 1 application topically 3 (three) times daily as needed (pain).     loratadine (CLARITIN) 10 MG tablet Take 1 tablet (10 mg total) by mouth daily.  30 tablet 3   magnesium hydroxide (MILK OF MAGNESIA) 400 MG/5ML suspension Take 30 mLs by mouth daily as needed for mild  constipation.     Menthol 10 % AERO Apply 1 application topically 3 (three) times daily as needed (pain).     nystatin (MYCOSTATIN) 100000 UNIT/ML suspension TAKE 5 ML BY MOUTH  4 TIMES DAILY AS NEEDED FOR THRUSH 500 mL 0   oxyCODONE-acetaminophen (PERCOCET/ROXICET) 5-325 MG tablet Take 1 tablet by mouth every 12 (twelve) hours as needed for severe pain. 60 tablet 0   SYMBICORT 80-4.5 MCG/ACT inhaler Inhale 2 puffs into the lungs 2 (two) times daily. 11 g 11   triamcinolone (KENALOG) 0.1 % Apply 1 application topically 2 (two) times daily. 30 g 0   UNABLE TO FIND Capsule to rebuild cells-BID     UNABLE TO FIND as needed. Penetrex cream     valACYclovir (VALTREX) 1000 MG tablet Take 1 tablet (1,000 mg total) by mouth 2 (two) times daily. 14 tablet 0   No facility-administered medications prior to visit.    Allergies  Allergen Reactions   Naproxen Shortness Of Breath    Chest pain   Dicyclomine Hcl Other (See Comments)    Vision changes   Duloxetine Other (See Comments)    Suicidal    Eggs Or Egg-Derived Products Diarrhea    abd pain   Lovastatin Other (See Comments)    Chest pain   Toradol [Ketorolac Tromethamine] Other (See Comments)    Headache. Non-effective   Tramadol Other (See Comments)    Headache. Non-effective.   Nexium [Esomeprazole Magnesium] Diarrhea    Abdominal pain    ROS Review of Systems  Constitutional:  Negative for chills and fever.  HENT:  Negative for congestion, sinus pressure, sinus pain and sore throat.   Eyes:  Negative for pain and discharge.  Respiratory:  Negative for cough and shortness of breath.   Cardiovascular:  Negative for chest pain and palpitations.  Gastrointestinal:  Positive for abdominal pain (chronic). Negative for constipation, diarrhea, nausea and vomiting.  Endocrine: Negative for polydipsia and polyuria.  Genitourinary:  Negative for dysuria and hematuria.  Musculoskeletal:  Positive for arthralgias, back pain and neck pain.  Negative for neck stiffness.  Skin:  Negative for rash.  Neurological:  Positive for numbness. Negative for dizziness and weakness.  Psychiatric/Behavioral:  Negative for agitation and behavioral problems. The patient is nervous/anxious.      Objective:    Physical Exam Vitals reviewed.  Constitutional:      General: She is not in acute distress.    Appearance: She is not diaphoretic.  HENT:     Head: Normocephalic and atraumatic.     Nose: Nose normal.     Mouth/Throat:     Mouth: Mucous membranes are moist.  Eyes:     General: No scleral icterus.    Extraocular Movements: Extraocular movements intact.  Cardiovascular:     Rate and Rhythm: Normal rate and regular rhythm.     Pulses: Normal pulses.     Heart sounds: Normal heart sounds. No murmur heard. Pulmonary:     Breath sounds: Normal breath sounds. No wheezing or rales.  Abdominal:     Palpations: Abdomen is soft.     Tenderness: There is abdominal tenderness (Mild, generalized).  Musculoskeletal:        General: Tenderness (Lumbar spine area) present.     Cervical back: Neck supple. No tenderness.     Right lower leg: No edema.  Left lower leg: No edema.  Skin:    General: Skin is warm.  Neurological:     General: No focal deficit present.     Mental Status: She is alert and oriented to person, place, and time.  Psychiatric:        Mood and Affect: Mood normal.        Behavior: Behavior normal.    BP 116/68 (BP Location: Right Arm, Patient Position: Sitting, Cuff Size: Normal)    Pulse (!) 115    Resp 18    Ht 5' (1.524 m)    Wt 110 lb 1.3 oz (49.9 kg)    SpO2 96%    BMI 21.50 kg/m  Wt Readings from Last 3 Encounters:  06/18/21 110 lb 1.3 oz (49.9 kg)  05/01/21 114 lb (51.7 kg)  02/14/21 111 lb 1.3 oz (50.4 kg)    Lab Results  Component Value Date   TSH 1.110 02/09/2021   Lab Results  Component Value Date   WBC 4.6 02/09/2021   HGB 18.1 (H) 02/09/2021   HCT 51.8 (H) 02/09/2021   MCV 103 (H)  02/09/2021   PLT 197 02/09/2021   Lab Results  Component Value Date   NA 139 02/09/2021   K 3.5 02/09/2021   CO2 22 02/09/2021   GLUCOSE 91 02/09/2021   BUN 7 (L) 02/09/2021   CREATININE 0.65 02/09/2021   BILITOT 0.7 02/09/2021   ALKPHOS 114 02/09/2021   AST 49 (H) 02/09/2021   ALT 38 (H) 02/09/2021   PROT 6.8 02/09/2021   ALBUMIN 4.3 02/09/2021   CALCIUM 9.7 02/09/2021   ANIONGAP 12 01/31/2020   EGFR 99 02/09/2021   Lab Results  Component Value Date   CHOL 239 (H) 02/09/2021   Lab Results  Component Value Date   HDL 140 02/09/2021   Lab Results  Component Value Date   LDLCALC 84 02/09/2021   Lab Results  Component Value Date   TRIG 93 02/09/2021   Lab Results  Component Value Date   CHOLHDL 1.7 02/09/2021   No results found for: HGBA1C    Assessment & Plan:   Problem List Items Addressed This Visit       Cardiovascular and Mediastinum   Essential hypertension - Primary    BP Readings from Last 1 Encounters:  06/18/21 116/68  Well-controlled with Amlodipine 10 mg QD Counseled for compliance with the medications Advised DASH diet and moderate exercise/walking, at least 150 mins/week       Relevant Orders   CBC with Differential/Platelet     Respiratory   Chronic bronchitis ([place])    Continues to smoke about 3-4 cigarettes/day Albuterol PRN      Relevant Orders   CBC with Differential/Platelet     Digestive   Crohn's disease of small intestine with complication ([place])    S/p partial bowel resection F/u with GI Has tried multiple medications in the past Was recently given Bentyl, but stopped taking it as she was having vision difficulty with it. Referred to [facility] pain clinic for second opinion      Relevant Orders   CBC with Differential/Platelet     Endocrine   Hypothyroidism    Lab Results  Component Value Date   TSH 1.110 02/09/2021  Now controlled with Levothyroxine 25 mcg QD Check TSH and free T4 in the next visit       Relevant Orders   TSH + free T4     Other   Chronic pain syndrome  From chronic back pain and IBD On Gabapentin and Cymbalta Used to f/u with Dr Merlene Laughter, states that she has been taken off of her opioid medication and she has been in severe back and abdominal pain Sent Oxycodone-Acetaminophen 5-325 mg q12h PRN for now Referred to [facility] pain clinic for second opinion      Tobacco abuse    Smokes about 3 to 4 cigarettes/day  Asked about quitting: confirms that she currently smokes cigarettes Advise to quit smoking: Educated about QUITTING to reduce the risk of cancer, cardio and cerebrovascular disease. Assess willingness: Unwilling to quit at this time, but is working on cutting back. Assist with counseling and pharmacotherapy: Counseled for 5 minutes and literature provided. Arrange for follow up: Follow up in 3 months and continue to offer help.      S/p lumbar fusion surgery Chronic back pain, followed by Neurosurgeon Referred to PT  Other Visit Diagnoses     Refused influenza vaccine         No orders of the defined types were placed in this encounter.   Follow-up: Return in about 4 months (around 10/16/2021) for Annual physical.    Lindell Spar, MD

## 2021-06-18 NOTE — Patient Instructions (Signed)
Please continue taking medications as prescribed.  Please continue to follow low salt diet and ambulate as tolerated. 

## 2021-06-18 NOTE — Assessment & Plan Note (Signed)
From chronic back pain and IBD On Gabapentin and Cymbalta Used to f/u with Dr Merlene Laughter, states that she has been taken off of her opioid medication and she has been in severe back and abdominal pain Sent Oxycodone-Acetaminophen 5-325 mg q12h PRN for now Referred to [facility] pain clinic for second opinion

## 2021-06-26 DIAGNOSIS — M5442 Lumbago with sciatica, left side: Secondary | ICD-10-CM | POA: Diagnosis not present

## 2021-06-26 DIAGNOSIS — G894 Chronic pain syndrome: Secondary | ICD-10-CM | POA: Diagnosis not present

## 2021-06-26 DIAGNOSIS — Z79899 Other long term (current) drug therapy: Secondary | ICD-10-CM | POA: Diagnosis not present

## 2021-06-26 DIAGNOSIS — M4326 Fusion of spine, lumbar region: Secondary | ICD-10-CM | POA: Diagnosis not present

## 2021-06-26 DIAGNOSIS — M545 Low back pain, unspecified: Secondary | ICD-10-CM | POA: Diagnosis not present

## 2021-06-26 DIAGNOSIS — Z79891 Long term (current) use of opiate analgesic: Secondary | ICD-10-CM | POA: Diagnosis not present

## 2021-06-27 ENCOUNTER — Other Ambulatory Visit: Payer: Self-pay | Admitting: Internal Medicine

## 2021-06-27 DIAGNOSIS — J302 Other seasonal allergic rhinitis: Secondary | ICD-10-CM

## 2021-06-27 DIAGNOSIS — E039 Hypothyroidism, unspecified: Secondary | ICD-10-CM

## 2021-07-11 ENCOUNTER — Ambulatory Visit (HOSPITAL_COMMUNITY): Payer: Medicare Other | Attending: Internal Medicine | Admitting: Physical Therapy

## 2021-07-11 ENCOUNTER — Other Ambulatory Visit: Payer: Self-pay

## 2021-07-11 DIAGNOSIS — M6281 Muscle weakness (generalized): Secondary | ICD-10-CM | POA: Insufficient documentation

## 2021-07-11 DIAGNOSIS — Z981 Arthrodesis status: Secondary | ICD-10-CM | POA: Insufficient documentation

## 2021-07-11 DIAGNOSIS — M5416 Radiculopathy, lumbar region: Secondary | ICD-10-CM | POA: Diagnosis not present

## 2021-07-11 NOTE — Therapy (Addendum)
[facility] [address] [place], [place], [address] Phone: [phone]   Fax:  [phone]  Physical Therapy Evaluation  Patient Details  Name: [name] [name] MRN: [id number] Date of Birth: [birth year] Referring Provider (PT): patel Rutwick   Encounter Date: 07/11/2021   PT End of Session - 07/11/21 1416     Visit Number 1    Number of Visits 12    Date for PT Re-Evaluation 08/22/21    Authorization Type [facility]c MEDICARE    Progress Note Due on Visit 10    PT Start Time 1000    PT Stop Time 1045    PT Time Calculation (min) 45 min    Activity Tolerance Patient tolerated treatment well    Behavior During Therapy [facility] for tasks assessed/performed             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   Crohn disease (HCC)    CTS (carpal tunnel syndrome)    Diarrhea MULITIFACTORIAL   IBS, LACTOSE INTOLERANCE, SBBO, BILE-SALT   Elevated liver enzymes 2009: BMI 24 ?Etoh ALT 94, AST 51,  NEG ANA, qIGs& ASMA   MAY 2012 AST 52 ALT 38   Esophageal stricture 2009   GERD (gastroesophageal reflux disease)    Hemorrhoid    Hyperlipemia    Hypertension    Inflammatory bowel disease 2006 CD   SURGICAL REMISSION   LBP (low back pain)    Mental disorder    Migraine    Osteoporosis    PONV (postoperative nausea and vomiting)    Shortness of breath    exertion and humidity   Shoulder pain    right   Sleep apnea    Stop [employer] score of 4    Past Surgical History:  Procedure Laterality Date   BACK SURGERY     BACK SURGERY  07/16/2018   BILATERAL SALPINGOOPHORECTOMY     BIOPSY  12/24/2011   Procedure: BIOPSY;  Surgeon: West Bali, MD;  [facility];  Service: Endoscopy;;  Gastric Biopsies   BIOPSY N/A 06/30/2012   Procedure: BIOPSY;   Surgeon: West Bali, MD;  Location: [facility];  Service: Endoscopy;  Laterality: N/A;  Gastric and Esophageal Biopsies   CARPAL TUNNEL RELEASE  LEFT   CATARACT EXTRACTION W/PHACO Right 07/30/2016   Procedure: CATARACT EXTRACTION PHACO AND INTRAOCULAR LENS PLACEMENT (IOC);  Surgeon: Jethro Bolus, MD;  Location: [facility];  Service: Ophthalmology;  Laterality: Right;  CDE: 5.45   CATARACT EXTRACTION W/PHACO Left 08/13/2016   Procedure: CATARACT EXTRACTION PHACO AND INTRAOCULAR LENS PLACEMENT (IOC);  Surgeon: Jethro Bolus, MD;  Location: [facility];  Service: Ophthalmology;  Laterality: Left;  CDE: 5.59   COLON SURGERY     COLONOSCOPY  JAN 2009 DIARRHEA, ABD PAIN, BRBPR   ANASTOMOTIC ULCER(3MM), IH ON:GEXBMW ULCER, NL COLON bX   COLONOSCOPY WITH PROPOFOL N/A 04/21/2018   examined portion of the ileum normal, two 3-5 mm polyps, diverticulosis in the rectosigmoid and sigmoid colon, external and internal hemorrhoids, significant looping of the colon.  Pathology with tubular adenomas.  Recommendations to follow high-fiber diet and repeat colonoscopy in 5 to 10 years with peds colonoscope.   DILATION AND CURETTAGE OF UTERUS  ESOPHAGEAL DILATION  12/24/2011   SLF: A stricture was found in the distal esophagus/ Moderate gastritis/ MILD Duodenitis   ESOPHAGOGASTRODUODENOSCOPY  02/07/09   mild gastritis   ESOPHAGOGASTRODUODENOSCOPY (EGD) WITH PROPOFOL N/A 06/30/2012   SLF: 1. No definite stricture appreciated 2. small hiatal hernia 3. Gastritis   ESOPHAGOGASTRODUODENOSCOPY (EGD) WITH PROPOFOL N/A 02/20/2016   Procedure: ESOPHAGOGASTRODUODENOSCOPY (EGD) WITH PROPOFOL;  Surgeon: West Bali, MD;  [facility];  Service: Endoscopy;  Laterality: N/A;  930   ESOPHAGOGASTRODUODENOSCOPY (EGD) WITH PROPOFOL N/A 11/24/2018   benign-appearing esophageal peptic stricture s/p dilation, small hiatal hernia, moderate erosive gastritis.    EYE SURGERY     gum flap Bilateral    HEMICOLECTOMY  RIGHT 2006   HERNIA  REPAIR     LUMBAR DISC SURGERY     POLYPECTOMY  04/21/2018   Procedure: POLYPECTOMY;  Surgeon: West Bali, MD;  [facility];  Service: Endoscopy;;  (colon)   SAVORY DILATION N/A 06/30/2012   Procedure: SAVORY DILATION;  Surgeon: West Bali, MD;  [facility]: [facility];  Service: Endoscopy;  Laterality: N/A;  12.69mm , 14mm, 15mm   SAVORY DILATION N/A 02/20/2016   Procedure: SAVORY DILATION;  Surgeon: West Bali, MD;  [facility];  Service: Endoscopy;  Laterality: N/A;   SAVORY DILATION N/A 11/24/2018   Procedure: SAVORY DILATION;  Surgeon: West Bali, MD;  [facility];  Service: Endoscopy;  Laterality: N/A;   UPPER GASTROINTESTINAL ENDOSCOPY  JAN 2009 ABD PAIN   GASTRITIS, ESO RING   UPPER GASTROINTESTINAL ENDOSCOPY  OCT 2010 ABD PAIN, DYSPHAGIA   DIL , GASTRITIS, NL DUODENUM   UPPER GASTROINTESTINAL ENDOSCOPY  OCT 2010 DYSPHAGIA   DIL 17 MM, GASTRITIS, NL DUODENUM   vocal cord surgery  Sep 20, 2010   precancerous areas removed   wisdom tooth extraction Right    pin placement due to broken jaw    There were no vitals filed for this visit. DX: S/P lumbar fusion 06/18/2021   Subjective Assessment - 07/11/21 1017     Subjective PT states that she had a fusion on her back on 06/18/2021.  She states she has a lot of pain in her tooth and she needs to have a root canal next Wed.  Her back is not hurting as much in her back.  She is having trouble doing her housework and walking noting that she loses her balance easily.    Pertinent History prior back surgery, anxiety COPD, migranes, osteoporosis . hx of concussion    Limitations House hold activities;Sitting;Lifting;Walking    How long can you sit comfortably? 10 minutes    How long can you stand comfortably? 15 minutes    How long can you walk comfortably? 30 minutes    Patient Stated Goals to be able to learn to move better to stop a third surgery    Currently in Pain? Yes    Pain Score 9      Pain Location Jaw    Pain Orientation Left    Pain Descriptors / Indicators Aching    Pain Type Acute pain    Pain Radiating Towards having a root canal on WEd.    Pain Onset In the past 7 days    Pain Frequency Constant    Multiple Pain Sites Yes    Pain Score 4    Pain Location Back    Pain Orientation Lower;Left    Pain Descriptors / Indicators Aching  Pain Type Surgical pain    Pain Radiating Towards to ankle    Pain Onset 1 to 4 weeks ago    Pain Frequency Constant    Aggravating Factors  staying in one place for to long    Pain Relieving Factors lying down    Effect of Pain on Daily Activities limits                [facility] PT Assessment - 07/11/21 0001       Assessment   Medical Diagnosis lumbar fusion    Referring Provider (PT) patel Rutwick    Onset Date/Surgical Date 06/18/21    Next MD Visit 10/15/2021    Prior Therapy not for this condition      Precautions   Precautions Back      Restrictions   Weight Bearing Restrictions No      Balance Screen   Has the patient fallen in the past 6 months No    Has the patient had a decrease in activity level because of a fear of falling?  Yes    Is the patient reluctant to leave their home because of a fear of falling?  No      Home Tourist information centre manager residence      Prior Function   Level of Independence Independent      Cognition   Overall Cognitive Status Within Functional Limits for tasks assessed      Observation/Other Assessments   Focus on Therapeutic Outcomes (FOTO)  34      Functional Tests   Functional tests Single leg stance;Sit to Stand      Single Leg Stance   Comments 0"      Sit to Stand   Comments 6 in 30" wt on RT LE      Posture/Postural Control   Posture/Postural Control Postural limitations    Postural Limitations Rounded Shoulders;Forward head;Increased thoracic kyphosis;Flexed trunk      ROM / Strength   AROM / PROM / Strength Strength;AROM       AROM   AROM Assessment Site Lumbar      Strength   Strength Assessment Site Hip;Knee;Ankle    Right/Left Hip Right;Left    Right Hip Flexion 3/5    Right Hip Extension 2+/5    Right Hip ABduction 3+/5    Left Hip Flexion 2+/5    Left Hip Extension 2/5    Left Hip ABduction 2+/5    Right/Left Knee Right;Left    Right Knee Extension 3+/5    Left Knee Extension 3+/5    Right/Left Ankle Right;Left                        Objective measurements completed on examination: See above findings.       [facility] Adult PT Treatment/Exercise - 07/11/21 0001       Exercises   Exercises Lumbar      Lumbar Exercises: Supine   Ab Set 5 reps    Other Supine Lumbar Exercises decompression 1-3                       PT Short Term Goals - 07/11/21 1427       PT SHORT TERM GOAL #1   Title PT to be I in HEP to allow pt back pain to be no greater than a 5/10    Time 3    Status New  Target Date 08/01/21      PT SHORT TERM GOAL #2   Title Pt to be able to sit in comfort for an hour to be able to watch television in comfort.    Time 3    Period Weeks    Status New      PT SHORT TERM GOAL #3   Title PT to be walking for 15 minutes without increased pain daily to improve healthy lifestyle    Time 3    Period Weeks    Status New               PT Long Term Goals - 07/11/21 1525       PT LONG TERM GOAL #1   Title PT to be I in an advance HEP to decrease back pain to no greater than a 3/10 for improved activity tolerance.    Time 6    Period Weeks    Status New    Target Date 08/22/21      PT LONG TERM GOAL #2   Title PT radicular pain to be no further than her Left buttock to demonstrate decreased nerve irritation    Time 6    Period Weeks    Status New      PT LONG TERM GOAL #3   Title PT to be able to demonstrate proper body mechanics for bed mobility and lifting to decrease stress on Low back to prevent future injury.    Time 6    Period  Weeks    Status New      PT LONG TERM GOAL #4   Title Pt core and LE strength to increase to decrease stress on LOw back as noted by mm test as well as improved sit to stand.    Time 3    Period Weeks    Status New      PT LONG TERM GOAL #5   Title PT single leg stance to be at least 8 seconds B to reduce risk of falling    Time 6    Period Weeks    Status New                    Plan - 07/11/21 1418     Clinical Impression Statement Ms. Rang is a 66 yo female who has been referred to skilled PT by Dr. Allena Katz following a lumbar fusion on 06/18/2021.  Currently she continues to have radiating pain into her left ankle as well as decreased mm strength and decreased activity tolerance.  Ms. Mellas will benefit from skilled PT to improve her knowledge of proper body mechanics, improve her core and LE strength as well as her banlance to improve her functional ability.    Personal Factors and Comorbidities Comorbidity 2;Fitness    Comorbidities past lumbar surgery, chronic pain syndrome, OA, osteoporosis    Examination-Activity Limitations Carry;Lift;Locomotion Level;Stand;Sit    Examination-Participation Restrictions Cleaning;Community Activity;Laundry;Yard Work    Conservation officer, historic buildings Evolving/Moderate complexity    Clinical Decision Making Moderate    Rehab Potential Good    PT Frequency 2x / week    PT Duration 6 weeks    PT Treatment/Interventions Manual techniques;Patient/family education;Therapeutic exercise    PT Next Visit Plan continue with lumbar stabilization exercises as well as hip and back stretches, 4-5 decompression, tband decompression and posture education.    PT Home Exercise Plan decompression 1-3 as well as abdominal set  Patient will benefit from skilled therapeutic intervention in order to improve the following deficits and impairments:  Abnormal gait, Pain, Difficulty walking, Decreased activity tolerance, Decreased strength,  Postural dysfunction, Improper body mechanics, Decreased balance  Visit Diagnosis: Radiculopathy, lumbar region  Muscle weakness (generalized)     Problem List Patient Active Problem List   Diagnosis Date Noted   Hypothyroidism 12/13/2020   S/P lumbar fusion 10/19/2019   Seasonal allergies 09/01/2018   Chronic low back pain 04/07/2018   Perianal cyst 02/24/2018   Alcohol abuse 12/23/2017   Tobacco abuse 06/25/2017   Osteoarthritis 12/27/2014   Allergic dermatitis eyelid 12/27/2014   Abnormal CT scan, chest 01/12/2014   Polycythemia 09/08/2012   Vertigo 01/22/2012   Neck pain 01/22/2012   Chronic pain syndrome 12/27/2011   Diarrhea, episodic 12/27/2011   Edema of larynx 04/22/2011   Dysphagia 03/14/2011   DEPRESSION 04/24/2010   Chronic bronchitis (HCC) 01/19/2010   Osteoporosis 11/11/2008   Spondylolisthesis, lumbar region 07/28/2008   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/2007   DEGENERATIVE DISC DISEASE, CERVICAL SPINE 06/29/2007   Hyperlipidemia 02/03/2007   VARCIES, SITE NEC 09/30/2006   Anxiety state 02/27/2006   Essential hypertension 02/27/2006   GERD 02/27/2006   Crohn's disease of small intestine with complication [facility]) 02/27/2006   Virgina Organ, PT CLT [phone]  07/11/2021, 3:31 PM   [facility] [address] [place], [place], [address] Phone: [phone]   Fax:  [phone]  Name: [name] [name] MRN: [id number] Date of Birth: 19-Jul-1955

## 2021-07-20 ENCOUNTER — Other Ambulatory Visit: Payer: Self-pay

## 2021-07-20 ENCOUNTER — Ambulatory Visit (HOSPITAL_COMMUNITY): Payer: Medicare Other | Admitting: Physical Therapy

## 2021-07-20 DIAGNOSIS — M6281 Muscle weakness (generalized): Secondary | ICD-10-CM

## 2021-07-20 DIAGNOSIS — M5416 Radiculopathy, lumbar region: Secondary | ICD-10-CM

## 2021-07-20 DIAGNOSIS — Z981 Arthrodesis status: Secondary | ICD-10-CM | POA: Diagnosis not present

## 2021-07-20 NOTE — Therapy (Signed)
?OUTPATIENT PHYSICAL THERAPY TREATMENT NOTE ? ? ?Patient Name: [name] [name] ?MRN: [id number] ?DOB:1955/07/14, 66 y.o., female ?Today's Date: 07/20/2021 ? ?PCP: Lindell Spar, MD ?REFERRING PROVIDER: Lindell Spar, MD ? ? PT End of Session - 07/20/21 4259   ? ? Visit Number 2   ? Number of Visits 12   ? Date for PT Re-Evaluation 08/22/21   ? Authorization Type [facility] MEDICARE   ? Progress Note Due on Visit 10   ? PT Start Time 5638   ? PT Stop Time 1000   ? PT Time Calculation (min) 40 min   ? Activity Tolerance Patient tolerated treatment well   ? Behavior During Therapy [facility] for tasks assessed/performed   ? ?  ?  ? ?  ? ? ?Past Medical History:  ?Diagnosis Date  ? Allergic rhinitis   ? Anastomotic ulcer JAN 2009  ? Anxiety   ? Arthritis   ? Asthma   ? BMI (body mass index) 20.0-29.9 2009 121 lbs  ? Concussion   ? COPD with asthma ([place]) MAR 2011 PFTS  ? Crohn disease ([place])   ? CTS (carpal tunnel syndrome)   ? Diarrhea MULITIFACTORIAL  ? IBS, LACTOSE INTOLERANCE, SBBO, BILE-SALT  ? Elevated liver enzymes 2009: BMI 24 ?Etoh ALT 94, AST 51,  NEG ANA, qIGs& ASMA  ? MAY 2012 AST 52 ALT 38  ? Esophageal stricture 2009  ? GERD (gastroesophageal reflux disease)   ? Hemorrhoid   ? Hyperlipemia   ? Hypertension   ? Inflammatory bowel disease 2006 CD  ? SURGICAL REMISSION  ? LBP (low back pain)   ? Mental disorder   ? Migraine   ? Osteoporosis   ? PONV (postoperative nausea and vomiting)   ? Shortness of breath   ? exertion and humidity  ? Shoulder pain   ? right  ? Sleep apnea   ? Stop [employer] score of 4  ? ?Past Surgical History:  ?Procedure Laterality Date  ? BACK SURGERY    ? BACK SURGERY  07/16/2018  ? BILATERAL SALPINGOOPHORECTOMY    ? BIOPSY  12/24/2011  ? Procedure: BIOPSY;  Surgeon: Danie Binder, MD;  [facility]: [facility];  Service: Endoscopy;;  Gastric Biopsies  ? BIOPSY N/A 06/30/2012  ? Procedure: BIOPSY;  Surgeon: Danie Binder, MD;  Location: [facility];  Service: Endoscopy;  Laterality: N/A;  Gastric and Esophageal  Biopsies  ? CARPAL TUNNEL RELEASE  LEFT  ? CATARACT EXTRACTION W/PHACO Right 07/30/2016  ? Procedure: CATARACT EXTRACTION PHACO AND INTRAOCULAR LENS PLACEMENT (IOC);  Surgeon: Rutherford Guys, MD;  Location: [facility];  Service: Ophthalmology;  Laterality: Right;  CDE: 5.45  ? CATARACT EXTRACTION W/PHACO Left 08/13/2016  ? Procedure: CATARACT EXTRACTION PHACO AND INTRAOCULAR LENS PLACEMENT (IOC);  Surgeon: Rutherford Guys, MD;  Location: [facility];  Service: Ophthalmology;  Laterality: Left;  CDE: 5.59  ? COLON SURGERY    ? COLONOSCOPY  JAN 2009 DIARRHEA, ABD PAIN, BRBPR  ? ANASTOMOTIC ULCER(3MM), IH VF:IEPPIR ULCER, NL COLON bX  ? COLONOSCOPY WITH PROPOFOL N/A 04/21/2018  ? examined portion of the ileum normal, two 3-5 mm polyps, diverticulosis in the rectosigmoid and sigmoid colon, external and internal hemorrhoids, significant looping of the colon.  Pathology with tubular adenomas.  Recommendations to follow high-fiber diet and repeat colonoscopy in 5 to 10 years with peds colonoscope.  ? DILATION AND CURETTAGE OF UTERUS    ? ESOPHAGEAL DILATION  12/24/2011  ? SLF: A stricture was found in the  distal esophagus/ Moderate gastritis/ MILD Duodenitis  ? ESOPHAGOGASTRODUODENOSCOPY  02/07/09  ? mild gastritis  ? ESOPHAGOGASTRODUODENOSCOPY (EGD) WITH PROPOFOL N/A 06/30/2012  ? SLF: 1. No definite stricture appreciated 2. small hiatal hernia 3. Gastritis  ? ESOPHAGOGASTRODUODENOSCOPY (EGD) WITH PROPOFOL N/A 02/20/2016  ? Procedure: ESOPHAGOGASTRODUODENOSCOPY (EGD) WITH PROPOFOL;  Surgeon: Danie Binder, MD;  [facility];  Service: Endoscopy;  Laterality: N/A;  930  ? ESOPHAGOGASTRODUODENOSCOPY (EGD) WITH PROPOFOL N/A 11/24/2018  ? benign-appearing esophageal peptic stricture s/p dilation, small hiatal hernia, moderate erosive gastritis.   ? EYE SURGERY    ? gum flap Bilateral   ? HEMICOLECTOMY  RIGHT 2006  ? HERNIA REPAIR    ? LUMBAR DISC SURGERY    ? POLYPECTOMY  04/21/2018  ? Procedure: POLYPECTOMY;  Surgeon: Danie Binder, MD;  [facility];  Service: Endoscopy;;  (colon)  ? SAVORY DILATION N/A 06/30/2012  ? Procedure: SAVORY DILATION;  Surgeon: Danie Binder, MD;  [facility]: [facility];  Service: Endoscopy;  Laterality: N/A;  12.39m , 122m 1597m? SAVORY DILATION N/A 02/20/2016  ? Procedure: SAVORY DILATION;  Surgeon: SanDanie BinderD;  [facility];  Service: Endoscopy;  Laterality: N/A;  ? SAVORY DILATION N/A 11/24/2018  ? Procedure: SAVORY DILATION;  Surgeon: FieDanie BinderD;  [facility];  Service: Endoscopy;  Laterality: N/A;  ? UPPER GASTROINTESTINAL ENDOSCOPY  JAN 2009 ABD PAIN  ? GASTRITIS, ESO RING  ? UPPER GASTROINTESTINAL ENDOSCOPY  OCT 2010 ABD PAIN, DYSPHAGIA  ? DIL 15MM, GASTRITIS, NL DUODENUM  ? UPPER GASTROINTESTINAL ENDOSCOPY  OCT 2010 DYSPHAGIA  ? DIL 17 MM, GASTRITIS, NL DUODENUM  ? vocal cord surgery  Sep 20, 2010  ? precancerous areas removed  ? wisdom tooth extraction Right   ? pin placement due to broken jaw  ? ?Patient Active Problem List  ? Diagnosis Date Noted  ? Hypothyroidism 12/13/2020  ? S/P lumbar fusion 10/19/2019  ? Seasonal allergies 09/01/2018  ? Chronic low back pain 04/07/2018  ? Perianal cyst 02/24/2018  ? Alcohol abuse 12/23/2017  ? Tobacco abuse 06/25/2017  ? Osteoarthritis 12/27/2014  ? Allergic dermatitis eyelid 12/27/2014  ? Abnormal CT scan, chest 01/12/2014  ? Polycythemia 09/08/2012  ? Vertigo 01/22/2012  ? Neck pain 01/22/2012  ? Chronic pain syndrome 12/27/2011  ? Diarrhea, episodic 12/27/2011  ? Edema of larynx 04/22/2011  ? Dysphagia 03/14/2011  ? DEPRESSION 04/24/2010  ? Chronic bronchitis (HCCEdmundson9/16/2011  ? Osteoporosis 11/11/2008  ? Spondylolisthesis, lumbar region 07/28/2008  ? 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/2007  ? DEGENERATIVE DISC DISEASE, CERVICAL SPINE 06/29/2007  ? Hyperlipidemia 02/03/2007  ? [place]  ? Anxiety state 02/27/2006  ? Essential hypertension 02/27/2006  ? GERD 02/27/2006  ? Crohn's disease  of small intestine with complication (HCCAlger0/16/02/9603 ? ?REFERRING DIAG: Z98.1 (ICD-10-CM) - S/P lumbar fusion  ? ?THERAPY DIAG:  ?Radiculopathy, lumbar region ? ?Muscle weakness (generalized) ? ?PERTINENT HISTORY: prior back surgery, anxiety COPD, migranes, osteoporosis . hx of concussion  ? ?PRECAUTIONS: Z98.1 (ICD-10-CM) - S/P lumbar fusion  ? ?SUBJECTIVE: Pt states that she did not do very good with her HEP due to her jaw hurting her so much. ? ?PAIN:  ?Are you having pain? Yes: NPRS scale: 5/10 ?Pain location: leg and back  ?Pain description: sharp shooting  ?Aggravating factors: unknown ?Relieving factors: nothing  ? ?07/20/2021 ?Standing :  wall arch ?Sitting:  tall posture x 10 ?                  Scapular retraction x 10 ?Supine:      Ab set x 10 ?                  Bent knee raise x 10  ?                  Bridge x 10 ?                  Decompression 1-5 x 5 each  ?                  Hamstring stretch 3 x 30"   ? ?[place] Adult PT Treatment/Exercise - 07/11/21 0001   ?  ?    ?     ?  Exercises  ?  Exercises Lumbar   ?     ?  Lumbar Exercises: Supine  ?  Ab Set 5 reps   ?  Other Supine Lumbar Exercises decompression 1-3   ?  ?   ?  ?  ?   ?  ?  ?  ?  ?  ?  ?  ?  ?  ?  ?  ?  PT Short Term Goals - 07/11/21 1427   ?  ?    ?     ?  PT SHORT TERM GOAL #1  ?  Title PT to be I in HEP to allow pt back pain to be no greater than a 5/10   ?  Time 3   ?  Status On going   ?  Target Date 08/01/21   ?     ?  PT SHORT TERM GOAL #2  ?  Title Pt to be able to sit in comfort for an hour to be able to watch television in comfort.   ?  Time 3   ?  Period Weeks   ?  Status On going   ?     ?  PT SHORT TERM GOAL #3  ?  Title PT to be walking for 15 minutes without increased pain daily to improve healthy lifestyle   ?  Time 3   ?  Period Weeks   ?  Status On going   ?  ?   ?  ?  ?   ?  ?  ?  ?  PT Long Term Goals - 07/11/21 1525   ?  ?    ?     ?  PT LONG TERM GOAL #1  ?  Title PT to be I in an advance HEP to decrease back pain  to no greater than a 3/10 for improved activity tolerance.   ?  Time 6   ?  Period Weeks   ?  Status On going   ?  Target Date 08/22/21   ?     ?  PT LONG TERM GOAL #2  ?  Title PT radicular pain to be

## 2021-07-24 DIAGNOSIS — M545 Low back pain, unspecified: Secondary | ICD-10-CM | POA: Diagnosis not present

## 2021-07-24 DIAGNOSIS — M5442 Lumbago with sciatica, left side: Secondary | ICD-10-CM | POA: Diagnosis not present

## 2021-07-24 DIAGNOSIS — G894 Chronic pain syndrome: Secondary | ICD-10-CM | POA: Diagnosis not present

## 2021-07-24 DIAGNOSIS — Z79899 Other long term (current) drug therapy: Secondary | ICD-10-CM | POA: Diagnosis not present

## 2021-07-24 DIAGNOSIS — M4326 Fusion of spine, lumbar region: Secondary | ICD-10-CM | POA: Diagnosis not present

## 2021-07-25 ENCOUNTER — Other Ambulatory Visit: Payer: Self-pay

## 2021-07-25 ENCOUNTER — Encounter (HOSPITAL_COMMUNITY): Payer: Self-pay

## 2021-07-25 ENCOUNTER — Ambulatory Visit (HOSPITAL_COMMUNITY): Payer: Medicare Other

## 2021-07-25 DIAGNOSIS — M6281 Muscle weakness (generalized): Secondary | ICD-10-CM | POA: Diagnosis not present

## 2021-07-25 DIAGNOSIS — Z981 Arthrodesis status: Secondary | ICD-10-CM | POA: Diagnosis not present

## 2021-07-25 DIAGNOSIS — M5416 Radiculopathy, lumbar region: Secondary | ICD-10-CM | POA: Diagnosis not present

## 2021-07-25 NOTE — Therapy (Signed)
?OUTPATIENT PHYSICAL THERAPY TREATMENT NOTE ? ? ?Patient Name: [name] [name] ?MRN: [id number] ?DOB:Nov 05, 1955, 66 y.o., female ?Today's Date: 07/25/2021 ? ?PCP: Lindell Spar, MD ?REFERRING PROVIDER: Lindell Spar, MD ? ? PT End of Session - 07/20/21 2376   ? ? Visit Number 3  ? Number of Visits 12   ? Date for PT Re-Evaluation 08/22/21   ? Authorization Type [facility] MEDICARE   ? Progress Note Due on Visit 10   ? PT Start Time 1138  ? PT Stop Time 2831  ? PT Time Calculation (min) 38 min  ? Activity Tolerance Patient tolerated treatment well   ? Behavior During Therapy [facility] for tasks assessed/performed   ? ?  ?  ? ?  ? ? ?Past Medical History:  ?Diagnosis Date  ? Allergic rhinitis   ? Anastomotic ulcer JAN 2009  ? Anxiety   ? Arthritis   ? Asthma   ? BMI (body mass index) 20.0-29.9 2009 121 lbs  ? Concussion   ? 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 Crohn disease ([place])   ? CTS (carpal tunnel syndrome)   ? Diarrhea MULITIFACTORIAL  ? IBS, LACTOSE INTOLERANCE, SBBO, BILE-SALT  ? Elevated liver enzymes 2009: BMI 24 ?Etoh ALT 94, AST 51,  NEG ANA, qIGs& ASMA  ? MAY 2012 AST 52 ALT 38  ? Esophageal stricture 2009  ? GERD (gastroesophageal reflux disease)   ? Hemorrhoid   ? Hyperlipemia   ? Hypertension   ? Inflammatory bowel disease 2006 CD  ? SURGICAL REMISSION  ? LBP (low back pain)   ? Mental disorder   ? Migraine   ? Osteoporosis   ? PONV (postoperative nausea and vomiting)   ? Shortness of breath   ? exertion and humidity  ? Shoulder pain   ? right  ? Sleep apnea   ? Stop [employer] score of 4  ? ?Past Surgical History:  ?Procedure Laterality Date  ? BACK SURGERY    ? BACK SURGERY  07/16/2018  ? BILATERAL SALPINGOOPHORECTOMY    ? BIOPSY  12/24/2011  ? Procedure: BIOPSY;  Surgeon: Danie Binder, MD;  [facility]: [facility];  Service: Endoscopy;;  Gastric Biopsies  ? BIOPSY N/A 06/30/2012  ? Procedure: BIOPSY;  Surgeon: Danie Binder, MD;  Location: [facility];  Service: Endoscopy;  Laterality: N/A;  Gastric and Esophageal  Biopsies  ? CARPAL TUNNEL RELEASE  LEFT  ? CATARACT EXTRACTION W/PHACO Right 07/30/2016  ? Procedure: CATARACT EXTRACTION PHACO AND INTRAOCULAR LENS PLACEMENT (IOC);  Surgeon: Rutherford Guys, MD;  Location: [facility];  Service: Ophthalmology;  Laterality: Right;  CDE: 5.45  ? CATARACT EXTRACTION W/PHACO Left 08/13/2016  ? Procedure: CATARACT EXTRACTION PHACO AND INTRAOCULAR LENS PLACEMENT (IOC);  Surgeon: Rutherford Guys, MD;  [facility]: [facility];  Service: Ophthalmology;  Laterality: Left;  CDE: 5.59  ? COLON SURGERY    ? COLONOSCOPY  JAN 2009 DIARRHEA, ABD PAIN, BRBPR  ? ANASTOMOTIC ULCER(3MM), IH DV:VOHYWV ULCER, NL COLON bX  ? COLONOSCOPY WITH PROPOFOL N/A 04/21/2018  ? examined portion of the ileum normal, two 3-5 mm polyps, diverticulosis in the rectosigmoid and sigmoid colon, external and internal hemorrhoids, significant looping of the colon.  Pathology with tubular adenomas.  Recommendations to follow high-fiber diet and repeat colonoscopy in 5 to 10 years with peds colonoscope.  ? DILATION AND CURETTAGE OF UTERUS    ? ESOPHAGEAL DILATION  12/24/2011  ? SLF: A stricture was found in the distal esophagus/ Moderate gastritis/  MILD Duodenitis  ? ESOPHAGOGASTRODUODENOSCOPY  02/07/09  ? mild gastritis  ? ESOPHAGOGASTRODUODENOSCOPY (EGD) WITH PROPOFOL N/A 06/30/2012  ? SLF: 1. No definite stricture appreciated 2. small hiatal hernia 3. Gastritis  ? ESOPHAGOGASTRODUODENOSCOPY (EGD) WITH PROPOFOL N/A 02/20/2016  ? Procedure: ESOPHAGOGASTRODUODENOSCOPY (EGD) WITH PROPOFOL;  Surgeon: Danie Binder, MD;  [facility];  Service: Endoscopy;  Laterality: N/A;  930  ? ESOPHAGOGASTRODUODENOSCOPY (EGD) WITH PROPOFOL N/A 11/24/2018  ? benign-appearing esophageal peptic stricture s/p dilation, small hiatal hernia, moderate erosive gastritis.   ? EYE SURGERY    ? gum flap Bilateral   ? HEMICOLECTOMY  RIGHT 2006  ? HERNIA REPAIR    ? LUMBAR DISC SURGERY    ? POLYPECTOMY  04/21/2018  ? Procedure: POLYPECTOMY;  Surgeon: Danie Binder, MD;  [facility];  Service: Endoscopy;;  (colon)  ? SAVORY DILATION N/A 06/30/2012  ? Procedure: SAVORY DILATION;  Surgeon: Danie Binder, MD;  [facility];  Service: Endoscopy;  Laterality: N/A;  12.84m , 178m 1530m? SAVORY DILATION N/A 02/20/2016  ? Procedure: SAVORY DILATION;  Surgeon: SanDanie BinderD;  [facility];  Service: Endoscopy;  Laterality: N/A;  ? SAVORY DILATION N/A 11/24/2018  ? Procedure: SAVORY DILATION;  Surgeon: FieDanie BinderD;  [facility];  Service: Endoscopy;  Laterality: N/A;  ? UPPER GASTROINTESTINAL ENDOSCOPY  JAN 2009 ABD PAIN  ? GASTRITIS, ESO RING  ? UPPER GASTROINTESTINAL ENDOSCOPY  OCT 2010 ABD PAIN, DYSPHAGIA  ? DIL 15MM, GASTRITIS, NL DUODENUM  ? UPPER GASTROINTESTINAL ENDOSCOPY  OCT 2010 DYSPHAGIA  ? DIL 17 MM, GASTRITIS, NL DUODENUM  ? vocal cord surgery  Sep 20, 2010  ? precancerous areas removed  ? wisdom tooth extraction Right   ? pin placement due to broken jaw  ? ?Patient Active Problem List  ? Diagnosis Date Noted  ? Hypothyroidism 12/13/2020  ? S/P lumbar fusion 10/19/2019  ? Seasonal allergies 09/01/2018  ? Chronic low back pain 04/07/2018  ? Perianal cyst 02/24/2018  ? Alcohol abuse 12/23/2017  ? Tobacco abuse 06/25/2017  ? Osteoarthritis 12/27/2014  ? Allergic dermatitis eyelid 12/27/2014  ? Abnormal CT scan, chest 01/12/2014  ? Polycythemia 09/08/2012  ? Vertigo 01/22/2012  ? Neck pain 01/22/2012  ? Chronic pain syndrome 12/27/2011  ? Diarrhea, episodic 12/27/2011  ? Edema of larynx 04/22/2011  ? Dysphagia 03/14/2011  ? DEPRESSION 04/24/2010  ? [place]/16/[place]  ? Osteoporosis 11/11/2008  ? Spondylolisthesis, lumbar region 07/28/2008  ? 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/2007  ? DEGENERATIVE DISC DISEASE, CERVICAL SPINE 06/29/2007  ? Hyperlipidemia 02/03/2007  ? VARCIES, SITE NECEl[place]/27/2008  ? Anxiety state 02/27/2006  ? Essential hypertension 02/27/2006  ? GERD 02/27/2006  ? Crohn's disease  of small intestine with complication ([place]/03/[place]/0938 ? ?REFERRING DIAG: Z98.1 (ICD-10-CM) - S/P lumbar fusion  ? ?THERAPY DIAG:  ?Radiculopathy, lumbar region ? ?Muscle weakness (generalized) ? ?PERTINENT HISTORY: prior back surgery, anxiety COPD, migranes, osteoporosis . hx of concussion  ? ?PRECAUTIONS: Z98.1 (ICD-10-CM) - S/P lumbar fusion  ? ?SUBJECTIVE: Pt stated she had increased pain this morning, medication assisted.  Does c/o abdominal issues today. ? ?PAIN:  ?Are you having pain? Yes: NPRS scale: 4/10 ?Pain location: leg and back  ?Pain description: sharp shooting  ?Aggravating factors: unknown ?Relieving factors: nothing  ?3/22: ? Bed mobility- log rolling ? Supine: Ab set 10 paired with exhale ?  Bent knee raise 10x 3"  with ab set ?  Bridge 10x ?  Decompression with RTB 5x 5" ? Seated:  Tall posture ?  Wback 15x ?   ? ?  ?07/20/2021 ?Standing :  wall arch ?Sitting:       tall posture x 10 ?                  Scapular retraction x 10 ?Supine:      Ab set x 10 ?                  Bent knee raise x 10  ?                  Bridge x 10 ?                  Decompression 1-5 x 5 each  ?                  Hamstring stretch 3 x 30"   ? ?[facility] Adult PT Treatment/Exercise - 07/11/21 0001   ?  ?    ?     ?  Exercises  ?  Exercises Lumbar   ?     ?  Lumbar Exercises: Supine  ?  Ab Set 5 reps   ?  Other Supine Lumbar Exercises decompression 1-3   ?  ?   ?  ?  ?   ?  ?  ?  ?  ?  ?  ?  ?  ?  ?  ?  ?  PT Short Term Goals - 07/11/21 1427   ?  ?    ?     ?  PT SHORT TERM GOAL #1  ?  Title PT to be I in HEP to allow pt back pain to be no greater than a 5/10   ?  Time 3   ?  Status On going   ?  Target Date 08/01/21   ?     ?  PT SHORT TERM GOAL #2  ?  Title Pt to be able to sit in comfort for an hour to be able to watch television in comfort.   ?  Time 3   ?  Period Weeks   ?  Status On going   ?     ?  PT SHORT TERM GOAL #3  ?  Title PT to be walking for 15 minutes without increased pain daily to improve healthy lifestyle   ?   Time 3   ?  Period Weeks   ?  Status On going   ?  ?   ?  ?  ?   ?  ?  ?  ?  PT Long Term Goals - 07/11/21 1525   ?  ?    ?     ?  PT LONG TERM GOAL #1  ?  Title PT to be I in an advance HEP to decrease b

## 2021-07-26 NOTE — Therapy (Signed)
OUTPATIENT PHYSICAL THERAPY TREATMENT NOTE   Patient Name: [name] [name] MRN: [id number] DOB:[birth year], 66 y.o., female Today's Date: 07/27/2021  PCP: Anabel Halon, MD REFERRING PROVIDER: Anabel Halon, MD   PT End of Session - 07/20/21 [phone]     Visit Number 4   Number of Visits 12    Date for PT Re-Evaluation 08/22/21    Authorization Type [facility] MEDICARE    Progress Note Due on Visit 10    PT Start Time 0858   PT Stop Time 0940   PT Time Calculation (min) 42 min   Activity Tolerance Patient tolerated treatment well    Behavior During Therapy [facility] for tasks assessed/performed             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   Crohn disease (HCC)    CTS (carpal tunnel syndrome)    Diarrhea MULITIFACTORIAL   IBS, LACTOSE INTOLERANCE, SBBO, BILE-SALT   Elevated liver enzymes 2009: BMI 24 ?Etoh ALT 94, AST 51,  NEG ANA, qIGs& ASMA   MAY 2012 AST 52 ALT 38   Esophageal stricture 2009   GERD (gastroesophageal reflux disease)    Hemorrhoid    Hyperlipemia    Hypertension    Inflammatory bowel disease 2006 CD   SURGICAL REMISSION   LBP (low back pain)    Mental disorder    Migraine    Osteoporosis    PONV (postoperative nausea and vomiting)    Shortness of breath    exertion and humidity   Shoulder pain    right   Sleep apnea    Stop [employer] score of 4   Past Surgical History:  Procedure Laterality Date   BACK SURGERY     BACK SURGERY  07/16/2018   BILATERAL SALPINGOOPHORECTOMY     BIOPSY  12/24/2011   Procedure: BIOPSY;  Surgeon: West Bali, MD;  [facility];  Service: Endoscopy;;  Gastric Biopsies   BIOPSY N/A 06/30/2012   Procedure: BIOPSY;  Surgeon: West Bali, MD;  Location: [facility];  Service: Endoscopy;  Laterality: N/A;  Gastric and Esophageal  Biopsies   CARPAL TUNNEL RELEASE  LEFT   CATARACT EXTRACTION W/PHACO Right 07/30/2016   Procedure: CATARACT EXTRACTION PHACO AND INTRAOCULAR LENS PLACEMENT (IOC);  Surgeon: Jethro Bolus, MD;  [facility]: [facility];  Service: Ophthalmology;  Laterality: Right;  CDE: 5.45   CATARACT EXTRACTION W/PHACO Left 08/13/2016   Procedure: CATARACT EXTRACTION PHACO AND INTRAOCULAR LENS PLACEMENT (IOC);  Surgeon: Jethro Bolus, MD;  [facility];  Service: Ophthalmology;  Laterality: Left;  CDE: 5.59   COLON SURGERY     COLONOSCOPY  JAN 2009 DIARRHEA, ABD PAIN, BRBPR   ANASTOMOTIC ULCER(3MM), IH JX:BJYNWG ULCER, NL COLON bX   COLONOSCOPY WITH PROPOFOL N/A 04/21/2018   examined portion of the ileum normal, two 3-5 mm polyps, diverticulosis in the rectosigmoid and sigmoid colon, external and internal hemorrhoids, significant looping of the colon.  Pathology with tubular adenomas.  Recommendations to follow high-fiber diet and repeat colonoscopy in 5 to 10 years with peds colonoscope.   DILATION AND CURETTAGE OF UTERUS     ESOPHAGEAL DILATION  12/24/2011   SLF: A stricture was found in the distal esophagus/ Moderate gastritis/  MILD Duodenitis   ESOPHAGOGASTRODUODENOSCOPY  02/07/09   mild gastritis   ESOPHAGOGASTRODUODENOSCOPY (EGD) WITH PROPOFOL N/A 06/30/2012   SLF: 1. No definite stricture appreciated 2. small hiatal hernia 3. Gastritis   ESOPHAGOGASTRODUODENOSCOPY (EGD) WITH PROPOFOL N/A 02/20/2016   Procedure: ESOPHAGOGASTRODUODENOSCOPY (EGD) WITH PROPOFOL;  Surgeon: West Bali, MD;  [facility];  Service: Endoscopy;  Laterality: N/A;  930   ESOPHAGOGASTRODUODENOSCOPY (EGD) WITH PROPOFOL N/A 11/24/2018   benign-appearing esophageal peptic stricture s/p dilation, small hiatal hernia, moderate erosive gastritis.    EYE SURGERY     gum flap Bilateral    HEMICOLECTOMY  RIGHT 2006   HERNIA REPAIR     LUMBAR DISC SURGERY     POLYPECTOMY  04/21/2018   Procedure: POLYPECTOMY;  Surgeon: West Bali, MD;  [facility];  Service: Endoscopy;;  (colon)   SAVORY DILATION N/A 06/30/2012   Procedure: SAVORY DILATION;  Surgeon: West Bali, MD;  [facility];  Service: Endoscopy;  Laterality: N/A;  12.14mm , 14mm, 15mm   SAVORY DILATION N/A 02/20/2016   Procedure: SAVORY DILATION;  Surgeon: West Bali, MD;  [facility];  Service: Endoscopy;  Laterality: N/A;   SAVORY DILATION N/A 11/24/2018   Procedure: SAVORY DILATION;  Surgeon: West Bali, MD;  [facility];  Service: Endoscopy;  Laterality: N/A;   UPPER GASTROINTESTINAL ENDOSCOPY  JAN 2009 ABD PAIN   GASTRITIS, ESO RING   UPPER GASTROINTESTINAL ENDOSCOPY  OCT 2010 ABD PAIN, DYSPHAGIA   DIL , GASTRITIS, NL DUODENUM   UPPER GASTROINTESTINAL ENDOSCOPY  OCT 2010 DYSPHAGIA   DIL 17 MM, GASTRITIS, NL DUODENUM   vocal cord surgery  Sep 20, 2010   precancerous areas removed   wisdom tooth extraction Right    pin placement due to broken jaw   Patient Active Problem List   Diagnosis Date Noted   Hypothyroidism 12/13/2020   S/P lumbar fusion 10/19/2019   Seasonal allergies 09/01/2018   Chronic low back pain 04/07/2018   Perianal cyst 02/24/2018   Alcohol abuse 12/23/2017   Tobacco abuse 06/25/2017   Osteoarthritis 12/27/2014   Allergic dermatitis eyelid 12/27/2014   Abnormal CT scan, chest 01/12/2014   Polycythemia 09/08/2012   Vertigo 01/22/2012   Neck pain 01/22/2012   Chronic pain syndrome 12/27/2011   Diarrhea, episodic 12/27/2011   Edema of larynx 04/22/2011   Dysphagia 03/14/2011   DEPRESSION 04/24/2010   Chronic bronchitis (HCC) 01/19/2010   Osteoporosis 11/11/2008   Spondylolisthesis, lumbar region 07/28/2008   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/2007   DEGENERATIVE DISC DISEASE, CERVICAL SPINE 06/29/2007   Hyperlipidemia 02/03/2007   VARCIES, SITE NEC 09/30/2006   Anxiety state 02/27/2006   Essential hypertension 02/27/2006   GERD 02/27/2006   Crohn's disease  of small intestine with complication (HCC) 02/27/2006    REFERRING DIAG: Z98.1 (ICD-10-CM) - S/P lumbar fusion   THERAPY DIAG:  Radiculopathy, lumbar region  Muscle weakness (generalized)  PERTINENT HISTORY: prior back surgery, anxiety COPD, migranes, osteoporosis . hx of concussion   PRECAUTIONS: Z98.1 (ICD-10-CM) - S/P lumbar fusion   SUBJECTIVE: Patient states she is really sore today from her last visit; asks to take it easy today. She is achy all over. She did take a pain pill this morning and that has helped some PAIN:  Are you having pain? Yes: NPRS scale: 3-4/10 Pain location: leg and back  Pain description: sharp shooting  Aggravating factors: unknown Relieving factors: nothing  07/27/20  Supine:  Ab set 10 paired with exhale   Bent knee raise 10x 3" with ab set   Bridge 10x, rest and then 5x   Decompression 1-5  10 x 5" each   Seated:  Tall posture   Wback 15x    3/22:  Bed mobility- log rolling  Supine: Ab set 10 paired with exhale   Bent knee raise 10x 3" with ab set   Bridge 10x   Decompression with RTB 5x 5"  Seated:  Tall posture   Wback 15x       07/20/2021 Standing :  wall arch Sitting:       tall posture x 10                   Scapular retraction x 10 Supine:      Ab set x 10                   Bent knee raise x 10                    Bridge x 10                   Decompression 1-5 x 5 each                    Hamstring stretch 3 x 30"    [facility] Adult PT Treatment/Exercise - 07/11/21 0001                Exercises    Exercises Lumbar          Lumbar Exercises: Supine    Ab Set 5 reps     Other Supine Lumbar Exercises decompression 1-3                                       PT Short Term Goals - 07/11/21 1427                PT SHORT TERM GOAL #1    Title PT to be I in HEP to allow pt back pain to be no greater than a 5/10     Time 3     Status On going     Target Date 08/01/21          PT SHORT TERM GOAL #2    Title Pt  to be able to sit in comfort for an hour to be able to watch television in comfort.     Time 3     Period Weeks     Status On going          PT SHORT TERM GOAL #3    Title PT to be walking for 15 minutes without increased pain daily to improve healthy lifestyle     Time 3     Period Weeks     Status On going                       PT Long Term Goals - 07/11/21 1525                PT LONG TERM GOAL #1    Title PT to be I in an advance HEP to decrease back pain to no greater than a 3/10 for improved activity tolerance.     Time 6     Period Weeks  Status On going     Target Date 08/22/21          PT LONG TERM GOAL #2    Title PT radicular pain to be no further than her Left buttock to demonstrate decreased nerve irritation     Time 6     Period Weeks     Status On going          PT LONG TERM GOAL #3    Title PT to be able to demonstrate proper body mechanics for bed mobility and lifting to decrease stress on Low back to prevent future injury.     Time 6     Period Weeks     Status New          PT LONG TERM GOAL #4    Title Pt core and LE strength to increase to decrease stress on LOw back as noted by mm test as well as improved sit to stand.     Time 3     Period Weeks     Status On going          PT LONG TERM GOAL #5    Title PT single leg stance to be at least 8 seconds B to reduce risk of falling     Time 6     Period Weeks     Status On going                                 Plan        Clinical Impression Statement Today's session focused on review of HEP and gentle progression of her lumbar stabilization exercises.Increased her bridge by 5 reps. Increased decompression exercises to 10 reps today.   She needs frequent reminders for breathing/exhaling during exercise and occassional redirect to task.She continues with impaired body awareness. She occassionally takes a misstep with walking but states this is due to an old head injury.  Patient will  continue to benefit from skilled therapy services to reduce deficits and improve functional level.      Personal Factors and Comorbidities Comorbidity 2;Fitness     Comorbidities past lumbar surgery, chronic pain syndrome, OA, osteoporosis     Examination-Activity Limitations Carry;Lift;Locomotion Level;Stand;Sit     Examination-Participation Restrictions Cleaning;Community Activity;Laundry;Yard Work     Conservation officer, historic buildings Evolving/Moderate complexity     Clinical Decision Making Moderate     Rehab Potential Good     PT Frequency 2x / week     PT Duration 6 weeks     PT Treatment/Interventions Manual techniques;Patient/family education;Therapeutic exercise     PT Next Visit Plan continue with lumbar stabilization exercises as well as hip and back stretches, 4-5 decompression, tband decompression and posture education.     PT Home Exercise Plan decompression 1-5 as well as abdominal set ; decompression with RTB.                  Patient will benefit from skilled therapeutic intervention in order to improve the following deficits and impairments:  Abnormal gait, Pain, Difficulty walking, Decreased activity tolerance, Decreased strength, Postural dysfunction, Improper body mechanics, Decreased balance   Visit Diagnosis: Radiculopathy, lumbar region   Muscle weakness (generalized)         Problem List     Patient Active Problem List    Diagnosis Date Noted   Hypothyroidism 12/13/2020   S/P lumbar fusion 10/19/2019  Seasonal allergies 09/01/2018   Chronic low back pain 04/07/2018   Perianal cyst 02/24/2018   Alcohol abuse 12/23/2017   Tobacco abuse 06/25/2017   Osteoarthritis 12/27/2014   Allergic dermatitis eyelid 12/27/2014   Abnormal CT scan, chest 01/12/2014   Polycythemia 09/08/2012   Vertigo 01/22/2012   Neck pain 01/22/2012   Chronic pain syndrome 12/27/2011   Diarrhea, episodic 12/27/2011   Edema of larynx 04/22/2011   Dysphagia 03/14/2011    DEPRESSION 04/24/2010   Chronic bronchitis (HCC) 01/19/2010   Osteoporosis 11/11/2008   Spondylolisthesis, lumbar region 07/28/2008   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/2007   DEGENERATIVE DISC DISEASE, CERVICAL SPINE 06/29/2007   Hyperlipidemia 02/03/2007   VARCIES, SITE NEC 09/30/2006   Anxiety state 02/27/2006   Essential hypertension 02/27/2006   GERD 02/27/2006   Crohn's disease of small intestine with complication (HCC) 02/27/2006     9:45 AM, 07/27/21 Jurline Folger Small Jamiaya Bina MPT [facility] [place] [phone] [phone]

## 2021-07-27 ENCOUNTER — Other Ambulatory Visit: Payer: Self-pay

## 2021-07-27 ENCOUNTER — Other Ambulatory Visit: Payer: Self-pay | Admitting: Internal Medicine

## 2021-07-27 ENCOUNTER — Ambulatory Visit (HOSPITAL_COMMUNITY): Payer: Medicare Other

## 2021-07-27 DIAGNOSIS — M6281 Muscle weakness (generalized): Secondary | ICD-10-CM

## 2021-07-27 DIAGNOSIS — M5416 Radiculopathy, lumbar region: Secondary | ICD-10-CM

## 2021-07-27 DIAGNOSIS — E039 Hypothyroidism, unspecified: Secondary | ICD-10-CM

## 2021-07-27 DIAGNOSIS — Z981 Arthrodesis status: Secondary | ICD-10-CM | POA: Diagnosis not present

## 2021-07-30 ENCOUNTER — Other Ambulatory Visit: Payer: Self-pay

## 2021-07-30 ENCOUNTER — Ambulatory Visit (HOSPITAL_COMMUNITY): Payer: Medicare Other

## 2021-07-30 DIAGNOSIS — M6281 Muscle weakness (generalized): Secondary | ICD-10-CM | POA: Diagnosis not present

## 2021-07-30 DIAGNOSIS — M5416 Radiculopathy, lumbar region: Secondary | ICD-10-CM

## 2021-07-30 DIAGNOSIS — Z981 Arthrodesis status: Secondary | ICD-10-CM | POA: Diagnosis not present

## 2021-07-30 NOTE — Therapy (Signed)
OUTPATIENT PHYSICAL THERAPY TREATMENT NOTE   Patient Name: [name] [name] MRN: [id number] DOB:1955-07-16, 66 y.o., female Today's Date: 07/30/2021  PCP: Anabel Halon, MD REFERRING PROVIDER: Anabel Halon, MD       Visit Number 5   Number of Visits 12    Date for PT Re-Evaluation 08/22/21    Authorization Type [facility] MEDICARE    Progress Note Due on Visit 10    PT Start Time 1115   PT Stop Time 1200   PT Time Calculation (min) 45 min   Activity Tolerance Patient tolerated treatment well    Behavior During Therapy [facility] for tasks assessed/performed             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   Crohn disease (HCC)    CTS (carpal tunnel syndrome)    Diarrhea MULITIFACTORIAL   IBS, LACTOSE INTOLERANCE, SBBO, BILE-SALT   Elevated liver enzymes 2009: BMI 24 ?Etoh ALT 94, AST 51,  NEG ANA, qIGs& ASMA   MAY 2012 AST 52 ALT 38   Esophageal stricture 2009   GERD (gastroesophageal reflux disease)    Hemorrhoid    Hyperlipemia    Hypertension    Inflammatory bowel disease 2006 CD   SURGICAL REMISSION   LBP (low back pain)    Mental disorder    Migraine    Osteoporosis    PONV (postoperative nausea and vomiting)    Shortness of breath    exertion and humidity   Shoulder pain    right   Sleep apnea    Stop [employer] score of 4   Past Surgical History:  Procedure Laterality Date   BACK SURGERY     BACK SURGERY  07/16/2018   BILATERAL SALPINGOOPHORECTOMY     BIOPSY  12/24/2011   Procedure: BIOPSY;  Surgeon: West Bali, MD;  [facility];  Service: Endoscopy;;  Gastric Biopsies   BIOPSY N/A 06/30/2012   Procedure: BIOPSY;  Surgeon: West Bali, MD;  Location: [facility];  Service: Endoscopy;  Laterality: N/A;  Gastric and Esophageal Biopsies   CARPAL TUNNEL RELEASE  LEFT    CATARACT EXTRACTION W/PHACO Right 07/30/2016   Procedure: CATARACT EXTRACTION PHACO AND INTRAOCULAR LENS PLACEMENT (IOC);  Surgeon: Jethro Bolus, MD;  Location: [facility];  Service: Ophthalmology;  Laterality: Right;  CDE: 5.45   CATARACT EXTRACTION W/PHACO Left 08/13/2016   Procedure: CATARACT EXTRACTION PHACO AND INTRAOCULAR LENS PLACEMENT (IOC);  Surgeon: Jethro Bolus, MD;  Location: [facility];  Service: Ophthalmology;  Laterality: Left;  CDE: 5.59   COLON SURGERY     COLONOSCOPY  JAN 2009 DIARRHEA, ABD PAIN, BRBPR   ANASTOMOTIC ULCER(3MM), IH WU:JWJXBJ ULCER, NL COLON bX   COLONOSCOPY WITH PROPOFOL N/A 04/21/2018   examined portion of the ileum normal, two 3-5 mm polyps, diverticulosis in the rectosigmoid and sigmoid colon, external and internal hemorrhoids, significant looping of the colon.  Pathology with tubular adenomas.  Recommendations to follow high-fiber diet and repeat colonoscopy in 5 to 10 years with peds colonoscope.   DILATION AND CURETTAGE OF UTERUS     ESOPHAGEAL DILATION  12/24/2011   SLF: A stricture was found in the distal esophagus/ Moderate gastritis/ MILD Duodenitis   ESOPHAGOGASTRODUODENOSCOPY  02/07/09  mild gastritis   ESOPHAGOGASTRODUODENOSCOPY (EGD) WITH PROPOFOL N/A 06/30/2012   SLF: 1. No definite stricture appreciated 2. small hiatal hernia 3. Gastritis   ESOPHAGOGASTRODUODENOSCOPY (EGD) WITH PROPOFOL N/A 02/20/2016   Procedure: ESOPHAGOGASTRODUODENOSCOPY (EGD) WITH PROPOFOL;  Surgeon: West Bali, MD;  [facility];  Service: Endoscopy;  Laterality: N/A;  930   ESOPHAGOGASTRODUODENOSCOPY (EGD) WITH PROPOFOL N/A 11/24/2018   benign-appearing esophageal peptic stricture s/p dilation, small hiatal hernia, moderate erosive gastritis.    EYE SURGERY     gum flap Bilateral    HEMICOLECTOMY  RIGHT 2006   HERNIA REPAIR     LUMBAR DISC SURGERY     POLYPECTOMY  04/21/2018   Procedure: POLYPECTOMY;  Surgeon: West Bali, MD;  [facility];   Service: Endoscopy;;  (colon)   SAVORY DILATION N/A 06/30/2012   Procedure: SAVORY DILATION;  Surgeon: West Bali, MD;  Location[facility];  Service: Endoscopy;  Laterality: N/A;  12.51mm , 14mm, 15mm   SAVORY DILATION N/A 02/20/2016   Procedure: SAVORY DILATION;  Surgeon: West Bali, MD;  [facility];  Service: Endoscopy;  Laterality: N/A;   SAVORY DILATION N/A 11/24/2018   Procedure: SAVORY DILATION;  Surgeon: West Bali, MD;  [facility];  Service: Endoscopy;  Laterality: N/A;   UPPER GASTROINTESTINAL ENDOSCOPY  JAN 2009 ABD PAIN   GASTRITIS, ESO RING   UPPER GASTROINTESTINAL ENDOSCOPY  OCT 2010 ABD PAIN, DYSPHAGIA   DIL , GASTRITIS, NL DUODENUM   UPPER GASTROINTESTINAL ENDOSCOPY  OCT 2010 DYSPHAGIA   DIL 17 MM, GASTRITIS, NL DUODENUM   vocal cord surgery  Sep 20, 2010   precancerous areas removed   wisdom tooth extraction Right    pin placement due to broken jaw   Patient Active Problem List   Diagnosis Date Noted   Hypothyroidism 12/13/2020   S/P lumbar fusion 10/19/2019   Seasonal allergies 09/01/2018   Chronic low back pain 04/07/2018   Perianal cyst 02/24/2018   Alcohol abuse 12/23/2017   Tobacco abuse 06/25/2017   Osteoarthritis 12/27/2014   Allergic dermatitis eyelid 12/27/2014   Abnormal CT scan, chest 01/12/2014   Polycythemia 09/08/2012   Vertigo 01/22/2012   Neck pain 01/22/2012   Chronic pain syndrome 12/27/2011   Diarrhea, episodic 12/27/2011   Edema of larynx 04/22/2011   Dysphagia 03/14/2011   DEPRESSION 04/24/2010   Chronic bronchitis (HCC) 01/19/2010   Osteoporosis 11/11/2008   Spondylolisthesis, lumbar region 07/28/2008   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/2007   DEGENERATIVE DISC DISEASE, CERVICAL SPINE 06/29/2007   Hyperlipidemia 02/03/2007   VARCIES, SITE NEC 09/30/2006   Anxiety state 02/27/2006   Essential hypertension 02/27/2006   GERD 02/27/2006   Crohn's disease of small intestine with  complication (HCC) 02/27/2006    REFERRING DIAG: Z98.1 (ICD-10-CM) - S/P lumbar fusion   THERAPY DIAG:  Radiculopathy, lumbar region  Muscle weakness (generalized)  PERTINENT HISTORY: prior back surgery, anxiety COPD, migranes, osteoporosis . hx of concussion   PRECAUTIONS: Z98.1 (ICD-10-CM) - S/P lumbar fusion   SUBJECTIVE: Patient states she was sore all weekend from her last visit.   She did take a pain pill this morning. She reports doing some walking at home. She states she knows the exercise helps her.   PAIN:  Are you having pain? Yes: NPRS scale: 2-3/10 Pain location: leg and back  Pain description: sharp shooting  Aggravating factors: unknown Relieving factors: nothing   07/30/21 Supine: Ab set 10 paired with exhale  Bent knee raise 10x 3" with ab set   Bridge 2 x 10   Decompression 1-5  10 x 5" each   Decompression arm reaches/lengtheners  x 5" hold x 10 reps    Wall push aways   Seated:  Tall posture   Wback 15x       07/27/20  Supine: Ab set 10 paired with exhale   Bent knee raise 10x 3" with ab set   Bridge 10x, rest and then 5x   Decompression 1-5  10 x 5" each   Seated:  Tall posture   Wback 15x    3/22:  Bed mobility- log rolling  Supine: Ab set 10 paired with exhale   Bent knee raise 10x 3" with ab set   Bridge 10x   Decompression with RTB 5x 5"  Seated:  Tall posture   Wback 15x       07/20/2021 Standing :  wall arch Sitting:       tall posture x 10                   Scapular retraction x 10 Supine:      Ab set x 10                   Bent knee raise x 10                    Bridge x 10                   Decompression 1-5 x 5 each                    Hamstring stretch 3 x 30"    [facility] Adult PT Treatment/Exercise - 07/11/21 0001                Exercises    Exercises Lumbar          Lumbar Exercises: Supine    Ab Set 5 reps     Other Supine Lumbar Exercises decompression 1-3                                        PT Short Term Goals - 07/11/21 1427                PT SHORT TERM GOAL #1    Title PT to be I in HEP to allow pt back pain to be no greater than a 5/10     Time 3     Status On going     Target Date 08/01/21          PT SHORT TERM GOAL #2    Title Pt to be able to sit in comfort for an hour to be able to watch television in comfort.     Time 3     Period Weeks     Status On going          PT SHORT TERM GOAL #3    Title PT to be walking for 15 minutes without increased pain daily to improve healthy lifestyle     Time 3     Period Weeks     Status On going                       PT Long Term Goals -  07/11/21 1525                PT LONG TERM GOAL #1    Title PT to be I in an advance HEP to decrease back pain to no greater than a 3/10 for improved activity tolerance.     Time 6     Period Weeks     Status On going     Target Date 08/22/21          PT LONG TERM GOAL #2    Title PT radicular pain to be no further than her Left buttock to demonstrate decreased nerve irritation     Time 6     Period Weeks     Status On going          PT LONG TERM GOAL #3    Title PT to be able to demonstrate proper body mechanics for bed mobility and lifting to decrease stress on Low back to prevent future injury.     Time 6     Period Weeks     Status New          PT LONG TERM GOAL #4    Title Pt core and LE strength to increase to decrease stress on LOw back as noted by mm test as well as improved sit to stand.     Time 3     Period Weeks     Status On going          PT LONG TERM GOAL #5    Title PT single leg stance to be at least 8 seconds B to reduce risk of falling     Time 6     Period Weeks     Status On going                                 Plan        Clinical Impression Statement Today's session focused on review of HEP and gentle progression of her lumbar stabilization exercises. Increased bridging to 2 sets of 10 today.  Added arm lengtheners and wall  push aways to treatment today. She needs frequent reminders for breathing/exhaling during exercise and occassional redirect to task.She continues with impaired body awareness. She occassionally takes a misstep with walking but states this is due to an old head injury.  Patient will continue to benefit from skilled therapy services to reduce deficits and improve functional level.      Personal Factors and Comorbidities Comorbidity 2;Fitness     Comorbidities past lumbar surgery, chronic pain syndrome, OA, osteoporosis     Examination-Activity Limitations Carry;Lift;Locomotion Level;Stand;Sit     Examination-Participation Restrictions Cleaning;Community Activity;Laundry;Yard Work     Conservation officer, historic buildings Evolving/Moderate complexity     Clinical Decision Making Moderate     Rehab Potential Good     PT Frequency 2x / week     PT Duration 6 weeks     PT Treatment/Interventions Manual techniques;Patient/family education;Therapeutic exercise     PT Next Visit Plan continue with lumbar stabilization exercises as well as hip and back stretches, 4-5 decompression, tband decompression and posture education.     PT Home Exercise Plan Progress lumbar stabilization, try wall slides                  Patient will benefit from skilled therapeutic intervention in order to improve the following deficits and impairments:  Abnormal  gait, Pain, Difficulty walking, Decreased activity tolerance, Decreased strength, Postural dysfunction, Improper body mechanics, Decreased balance   Visit Diagnosis: Radiculopathy, lumbar region   Muscle weakness (generalized)         Problem List     Patient Active Problem List    Diagnosis Date Noted   Hypothyroidism 12/13/2020   S/P lumbar fusion 10/19/2019   Seasonal allergies 09/01/2018   Chronic low back pain 04/07/2018   Perianal cyst 02/24/2018   Alcohol abuse 12/23/2017   Tobacco abuse 06/25/2017   Osteoarthritis 12/27/2014   Allergic  dermatitis eyelid 12/27/2014   Abnormal CT scan, chest 01/12/2014   Polycythemia 09/08/2012   Vertigo 01/22/2012   Neck pain 01/22/2012   Chronic pain syndrome 12/27/2011   Diarrhea, episodic 12/27/2011   Edema of larynx 04/22/2011   Dysphagia 03/14/2011   DEPRESSION 04/24/2010   Chronic bronchitis (HCC) 01/19/2010   Osteoporosis 11/11/2008   Spondylolisthesis, lumbar region 07/28/2008   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/2007   DEGENERATIVE DISC DISEASE, CERVICAL SPINE 06/29/2007   Hyperlipidemia 02/03/2007   VARCIES, SITE NEC 09/30/2006   Anxiety state 02/27/2006   Essential hypertension 02/27/2006   GERD 02/27/2006   Crohn's disease of small intestine with complication (HCC) 02/27/2006     12:01 PM, 07/30/21 Lynita Groseclose Small Keyoni Lapinski MPT [facility] [place] [phone] Ph:[phone]

## 2021-08-03 ENCOUNTER — Encounter (HOSPITAL_COMMUNITY): Payer: Medicare Other

## 2021-08-03 ENCOUNTER — Telehealth (HOSPITAL_COMMUNITY): Payer: Self-pay

## 2021-08-03 NOTE — Telephone Encounter (Signed)
Patient woke up with stuffy nose from pollen she wanted to cx for today ?

## 2021-08-06 NOTE — Therapy (Incomplete)
?OUTPATIENT PHYSICAL THERAPY TREATMENT NOTE ? ? ?Patient Name: [name] [name] ?MRN: [id number] ?DOB:[birth year], 66 y.o., female ?Today's Date: 08/06/2021 ? ?PCP: Lindell Spar, MD ?REFERRING PROVIDER: Lindell Spar, MD ? ?   ? ? Visit Number 5  ? Number of Visits 12   ? Date for PT Re-Evaluation 08/22/21   ? Authorization Type [facility] MEDICARE   ? Progress Note Due on Visit 10   ? PT Start Time 1115  ? PT Stop Time 1200  ? PT Time Calculation (min) 45 min  ? Activity Tolerance Patient tolerated treatment well   ? Behavior During Therapy [facility] for tasks assessed/performed   ? ?  ?  ? ?  ? ? ?Past Medical History:  ?Diagnosis Date  ? Allergic rhinitis   ? Anastomotic ulcer JAN 2009  ? Anxiety   ? Arthritis   ? Asthma   ? BMI (body mass index) 20.0-29.9 2009 121 lbs  ? Concussion   ? COPD with asthma ([place]) MAR 2011 PFTS  ? Crohn disease ([place])   ? CTS (carpal tunnel syndrome)   ? Diarrhea MULITIFACTORIAL  ? IBS, LACTOSE INTOLERANCE, SBBO, BILE-SALT  ? Elevated liver enzymes 2009: BMI 24 ?Etoh ALT 94, AST 51,  NEG ANA, qIGs& ASMA  ? MAY 2012 AST 52 ALT 38  ? Esophageal stricture 2009  ? GERD (gastroesophageal reflux disease)   ? Hemorrhoid   ? Hyperlipemia   ? Hypertension   ? Inflammatory bowel disease 2006 CD  ? SURGICAL REMISSION  ? LBP (low back pain)   ? Mental disorder   ? Migraine   ? Osteoporosis   ? PONV (postoperative nausea and vomiting)   ? Shortness of breath   ? exertion and humidity  ? Shoulder pain   ? right  ? Sleep apnea   ? Stop [employer] score of 4  ? ?Past Surgical History:  ?Procedure Laterality Date  ? BACK SURGERY    ? BACK SURGERY  07/16/2018  ? BILATERAL SALPINGOOPHORECTOMY    ? BIOPSY  12/24/2011  ? Procedure: BIOPSY;  Surgeon: Danie Binder, MD;  [facility];  Service: Endoscopy;;  Gastric Biopsies  ? BIOPSY N/A 06/30/2012  ? Procedure: BIOPSY;  Surgeon: Danie Binder, MD;  Location[facility];  Service: Endoscopy;  Laterality: N/A;  Gastric and Esophageal Biopsies  ? CARPAL TUNNEL RELEASE  LEFT   ? CATARACT EXTRACTION W/PHACO Right 07/30/2016  ? Procedure: CATARACT EXTRACTION PHACO AND INTRAOCULAR LENS PLACEMENT (IOC);  Surgeon: Rutherford Guys, MD;  Location: [facility];  Service: Ophthalmology;  Laterality: Right;  CDE: 5.45  ? CATARACT EXTRACTION W/PHACO Left 08/13/2016  ? Procedure: CATARACT EXTRACTION PHACO AND INTRAOCULAR LENS PLACEMENT (IOC);  Surgeon: Rutherford Guys, MD;  [facility]: [facility];  Service: Ophthalmology;  Laterality: Left;  CDE: 5.59  ? COLON SURGERY    ? COLONOSCOPY  JAN 2009 DIARRHEA, ABD PAIN, BRBPR  ? ANASTOMOTIC ULCER(3MM), IH OI:NOMVEH ULCER, NL COLON bX  ? COLONOSCOPY WITH PROPOFOL N/A 04/21/2018  ? examined portion of the ileum normal, two 3-5 mm polyps, diverticulosis in the rectosigmoid and sigmoid colon, external and internal hemorrhoids, significant looping of the colon.  Pathology with tubular adenomas.  Recommendations to follow high-fiber diet and repeat colonoscopy in 5 to 10 years with peds colonoscope.  ? DILATION AND CURETTAGE OF UTERUS    ? ESOPHAGEAL DILATION  12/24/2011  ? SLF: A stricture was found in the distal esophagus/ Moderate gastritis/ MILD Duodenitis  ? ESOPHAGOGASTRODUODENOSCOPY  02/07/09  ?  mild gastritis  ? ESOPHAGOGASTRODUODENOSCOPY (EGD) WITH PROPOFOL N/A 06/30/2012  ? SLF: 1. No definite stricture appreciated 2. small hiatal hernia 3. Gastritis  ? ESOPHAGOGASTRODUODENOSCOPY (EGD) WITH PROPOFOL N/A 02/20/2016  ? Procedure: ESOPHAGOGASTRODUODENOSCOPY (EGD) WITH PROPOFOL;  Surgeon: Danie Binder, MD;  [facility];  Service: Endoscopy;  Laterality: N/A;  930  ? ESOPHAGOGASTRODUODENOSCOPY (EGD) WITH PROPOFOL N/A 11/24/2018  ? benign-appearing esophageal peptic stricture s/p dilation, small hiatal hernia, moderate erosive gastritis.   ? EYE SURGERY    ? gum flap Bilateral   ? HEMICOLECTOMY  RIGHT 2006  ? HERNIA REPAIR    ? LUMBAR DISC SURGERY    ? POLYPECTOMY  04/21/2018  ? Procedure: POLYPECTOMY;  Surgeon: Danie Binder, MD;  [facility];   Service: Endoscopy;;  (colon)  ? SAVORY DILATION N/A 06/30/2012  ? Procedure: SAVORY DILATION;  Surgeon: Danie Binder, MD;  [facility];  Service: Endoscopy;  Laterality: N/A;  12.23m , 172m 15109m? SAVORY DILATION N/A 02/20/2016  ? Procedure: SAVORY DILATION;  Surgeon: SanDanie BinderD;  [facility];  Service: Endoscopy;  Laterality: N/A;  ? SAVORY DILATION N/A 11/24/2018  ? Procedure: SAVORY DILATION;  Surgeon: FieDanie BinderD;  [facility];  Service: Endoscopy;  Laterality: N/A;  ? UPPER GASTROINTESTINAL ENDOSCOPY  JAN 2009 ABD PAIN  ? GASTRITIS, ESO RING  ? UPPER GASTROINTESTINAL ENDOSCOPY  OCT 2010 ABD PAIN, DYSPHAGIA  ? DIL 15MM, GASTRITIS, NL DUODENUM  ? UPPER GASTROINTESTINAL ENDOSCOPY  OCT 2010 DYSPHAGIA  ? DIL 17 MM, GASTRITIS, NL DUODENUM  ? vocal cord surgery  Sep 20, 2010  ? precancerous areas removed  ? wisdom tooth extraction Right   ? pin placement due to broken jaw  ? ?Patient Active Problem List  ? Diagnosis Date Noted  ? Hypothyroidism 12/13/2020  ? S/P lumbar fusion 10/19/2019  ? Seasonal allergies 09/01/2018  ? Chronic low back pain 04/07/2018  ? Perianal cyst 02/24/2018  ? Alcohol abuse 12/23/2017  ? Tobacco abuse 06/25/2017  ? Osteoarthritis 12/27/2014  ? Allergic dermatitis eyelid 12/27/2014  ? Abnormal CT scan, chest 01/12/2014  ? Polycythemia 09/08/2012  ? Vertigo 01/22/2012  ? Neck pain 01/22/2012  ? Chronic pain syndrome 12/27/2011  ? Diarrhea, episodic 12/27/2011  ? Edema of larynx 04/22/2011  ? Dysphagia 03/14/2011  ? DEPRESSION 04/24/2010  ? Chronic bronchitis (HCCWinter9/16/2011  ? Osteoporosis 11/11/2008  ? Spondylolisthesis, lumbar region 07/28/2008  ? 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/2007  ? DEGENERATIVE DISC DISEASE, CERVICAL SPINE 06/29/2007  ? Hyperlipidemia 02/03/2007  ? [place]  ? Anxiety state 02/27/2006  ? Essential hypertension 02/27/2006  ? GERD 02/27/2006  ? Crohn's disease of small intestine with  complication (HCCNebo0/[id number]/14/3233 ? ?REFERRING DIAG: Z98.1 (ICD-10-CM) - S/P lumbar fusion  ? ?THERAPY DIAG:  ?No diagnosis found. ? ?PERTINENT HISTORY: prior back surgery, anxiety COPD, migranes, osteoporosis . hx of concussion  ? ?PRECAUTIONS: Z98.1 (ICD-10-CM) - S/P lumbar fusion  ? ?SUBJECTIVE: Patient states she was sore all weekend from her last visit.   She did take a pain pill this morning. She reports doing some walking at home. She states she knows the exercise helps her.  ? ?PAIN:  ?Are you having pain? Yes: NPRS scale: 2-3/10 ?Pain location: leg and back  ?Pain description: sharp shooting  ?Aggravating factors: unknown ?Relieving factors: nothing  ? ?07/30/21 ?Supine: Ab set 10 paired with exhale ?  Bent knee  raise 10x 3" with ab set ?  Bridge 2 x 10 ?  Decompression 1-5  10 x 5" each ?  Decompression arm reaches/lengtheners  x 5" hold x 10 reps ?   Wall push aways ? ? Seated:  Tall posture ?  Wback 15x ? ? ? ? ? ? ?07/27/20 ? Supine: Ab set 10 paired with exhale ?  Bent knee raise 10x 3" with ab set ?  Bridge 10x, rest and then 5x ?  Decompression 1-5  10 x 5" each ? ? Seated:  Tall posture ?  Wback 15x ? ? ? ?3/22: ? Bed mobility- log rolling ? Supine: Ab set 10 paired with exhale ?  Bent knee raise 10x 3" with ab set ?  Bridge 10x ?  Decompression with RTB 5x 5" ? Seated:  Tall posture ?  Wback 15x ?   ? ?  ?07/20/2021 ?Standing :  wall arch ?Sitting:       tall posture x 10 ?                  Scapular retraction x 10 ?Supine:      Ab set x 10 ?                  Bent knee raise x 10  ?                  Bridge x 10 ?                  Decompression 1-5 x 5 each  ?                  Hamstring stretch 3 x 30"   ? ?[facility] Adult PT Treatment/Exercise - 07/11/21 0001   ?  ?    ?     ?  Exercises  ?  Exercises Lumbar   ?     ?  Lumbar Exercises: Supine  ?  Ab Set 5 reps   ?  Other Supine Lumbar Exercises decompression 1-3   ?  ?   ?  ?  ?   ?  ?  ?  ?  ?  ?  ?  ?  ?  ?  ?  ?  PT Short Term Goals - 07/11/21 1427   ?   ?    ?     ?  PT SHORT TERM GOAL #1  ?  Title PT to be I in HEP to allow pt back pain to be no greater than a 5/10   ?  Time 3   ?  Status On going   ?  Target Date 08/01/21   ?     ?  PT SHORT TERM GOAL #2  ?  Title

## 2021-08-07 ENCOUNTER — Encounter (HOSPITAL_COMMUNITY): Payer: Medicare Other

## 2021-08-09 ENCOUNTER — Telehealth (HOSPITAL_COMMUNITY): Payer: Self-pay

## 2021-08-09 ENCOUNTER — Encounter (HOSPITAL_COMMUNITY): Payer: Medicare Other

## 2021-08-09 NOTE — Telephone Encounter (Signed)
Patient has a wound on her butt that just came up and she can not come today. She will call her MD for a wound referral today ?

## 2021-08-13 ENCOUNTER — Encounter (HOSPITAL_COMMUNITY): Payer: Medicare Other

## 2021-08-13 NOTE — Therapy (Incomplete)
?OUTPATIENT PHYSICAL THERAPY TREATMENT NOTE ? ? ?Patient Name: [name] [name] ?MRN: [id number] ?DOB:[birth year], 66 y.o., female ?Today's Date: 08/13/2021 ? ?PCP: Lindell Spar, MD ?REFERRING PROVIDER: Lindell Spar, MD ? ?   ? ? Visit Number 5  ? Number of Visits 12   ? Date for PT Re-Evaluation 08/22/21   ? Authorization Type [facility] MEDICARE   ? Progress Note Due on Visit 10   ? PT Start Time 1115  ? PT Stop Time 1200  ? PT Time Calculation (min) 45 min  ? Activity Tolerance Patient tolerated treatment well   ? Behavior During Therapy [facility] for tasks assessed/performed   ? ?  ?  ? ?  ? ? ?Past Medical History:  ?Diagnosis Date  ? Allergic rhinitis   ? Anastomotic ulcer JAN 2009  ? Anxiety   ? Arthritis   ? Asthma   ? BMI (body mass index) 20.0-29.9 2009 121 lbs  ? Concussion   ? 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 Crohn disease ([place])   ? CTS (carpal tunnel syndrome)   ? Diarrhea MULITIFACTORIAL  ? IBS, LACTOSE INTOLERANCE, SBBO, BILE-SALT  ? Elevated liver enzymes 2009: BMI 24 ?Etoh ALT 94, AST 51,  NEG ANA, qIGs& ASMA  ? MAY 2012 AST 52 ALT 38  ? Esophageal stricture 2009  ? GERD (gastroesophageal reflux disease)   ? Hemorrhoid   ? Hyperlipemia   ? Hypertension   ? Inflammatory bowel disease 2006 CD  ? SURGICAL REMISSION  ? LBP (low back pain)   ? Mental disorder   ? Migraine   ? Osteoporosis   ? PONV (postoperative nausea and vomiting)   ? Shortness of breath   ? exertion and humidity  ? Shoulder pain   ? right  ? Sleep apnea   ? Stop [employer] score of 4  ? ?Past Surgical History:  ?Procedure Laterality Date  ? BACK SURGERY    ? BACK SURGERY  07/16/2018  ? BILATERAL SALPINGOOPHORECTOMY    ? BIOPSY  12/24/2011  ? Procedure: BIOPSY;  Surgeon: Danie Binder, MD;  [facility];  Service: Endoscopy;;  Gastric Biopsies  ? BIOPSY N/A 06/30/2012  ? Procedure: BIOPSY;  Surgeon: Danie Binder, MD;  Location: [facility];  Service: Endoscopy;  Laterality: N/A;  Gastric and Esophageal Biopsies  ? CARPAL TUNNEL RELEASE  LEFT   ? CATARACT EXTRACTION W/PHACO Right 07/30/2016  ? Procedure: CATARACT EXTRACTION PHACO AND INTRAOCULAR LENS PLACEMENT (IOC);  Surgeon: Rutherford Guys, MD;  Location: [facility];  Service: Ophthalmology;  Laterality: Right;  CDE: 5.45  ? CATARACT EXTRACTION W/PHACO Left 08/13/2016  ? Procedure: CATARACT EXTRACTION PHACO AND INTRAOCULAR LENS PLACEMENT (IOC);  Surgeon: Rutherford Guys, MD;  Location: [facility];  Service: Ophthalmology;  Laterality: Left;  CDE: 5.59  ? COLON SURGERY    ? COLONOSCOPY  JAN 2009 DIARRHEA, ABD PAIN, BRBPR  ? ANASTOMOTIC ULCER(3MM), IH HK:[id number] ULCER, NL COLON bX  ? COLONOSCOPY WITH PROPOFOL N/A 04/21/2018  ? examined portion of the ileum normal, two 3-5 mm polyps, diverticulosis in the rectosigmoid and sigmoid colon, external and internal hemorrhoids, significant looping of the colon.  Pathology with tubular adenomas.  Recommendations to follow high-fiber diet and repeat colonoscopy in 5 to 10 years with peds colonoscope.  ? DILATION AND CURETTAGE OF UTERUS    ? ESOPHAGEAL DILATION  12/24/2011  ? SLF: A stricture was found in the distal esophagus/ Moderate gastritis/ MILD Duodenitis  ? ESOPHAGOGASTRODUODENOSCOPY  02/07/09  ?  mild gastritis  ? ESOPHAGOGASTRODUODENOSCOPY (EGD) WITH PROPOFOL N/A 06/30/2012  ? SLF: 1. No definite stricture appreciated 2. small hiatal hernia 3. Gastritis  ? ESOPHAGOGASTRODUODENOSCOPY (EGD) WITH PROPOFOL N/A 02/20/2016  ? Procedure: ESOPHAGOGASTRODUODENOSCOPY (EGD) WITH PROPOFOL;  Surgeon: Danie Binder, MD;  [facility];  Service: Endoscopy;  Laterality: N/A;  930  ? ESOPHAGOGASTRODUODENOSCOPY (EGD) WITH PROPOFOL N/A 11/24/2018  ? benign-appearing esophageal peptic stricture s/p dilation, small hiatal hernia, moderate erosive gastritis.   ? EYE SURGERY    ? gum flap Bilateral   ? HEMICOLECTOMY  RIGHT 2006  ? HERNIA REPAIR    ? LUMBAR DISC SURGERY    ? POLYPECTOMY  04/21/2018  ? Procedure: POLYPECTOMY;  Surgeon: Danie Binder, MD;  [facility];   Service: Endoscopy;;  (colon)  ? SAVORY DILATION N/A 06/30/2012  ? Procedure: SAVORY DILATION;  Surgeon: Danie Binder, MD;  [facility];  Service: Endoscopy;  Laterality: N/A;  12.15m , 165m 1571m? SAVORY DILATION N/A 02/20/2016  ? Procedure: SAVORY DILATION;  Surgeon: SanDanie BinderD;  [facility];  Service: Endoscopy;  Laterality: N/A;  ? SAVORY DILATION N/A 11/24/2018  ? Procedure: SAVORY DILATION;  Surgeon: FieDanie BinderD;  [facility];  Service: Endoscopy;  Laterality: N/A;  ? UPPER GASTROINTESTINAL ENDOSCOPY  JAN 2009 ABD PAIN  ? GASTRITIS, ESO RING  ? UPPER GASTROINTESTINAL ENDOSCOPY  OCT 2010 ABD PAIN, DYSPHAGIA  ? DIL 15MM, GASTRITIS, NL DUODENUM  ? UPPER GASTROINTESTINAL ENDOSCOPY  OCT 2010 DYSPHAGIA  ? DIL 17 MM, GASTRITIS, NL DUODENUM  ? vocal cord surgery  Sep 20, 2010  ? precancerous areas removed  ? wisdom tooth extraction Right   ? pin placement due to broken jaw  ? ?Patient Active Problem List  ? Diagnosis Date Noted  ? Hypothyroidism 12/13/2020  ? S/P lumbar fusion 10/19/2019  ? Seasonal allergies 09/01/2018  ? Chronic low back pain 04/07/2018  ? Perianal cyst 02/24/2018  ? Alcohol abuse 12/23/2017  ? Tobacco abuse 06/25/2017  ? Osteoarthritis 12/27/2014  ? Allergic dermatitis eyelid 12/27/2014  ? Abnormal CT scan, chest 01/12/2014  ? Polycythemia 09/08/2012  ? Vertigo 01/22/2012  ? Neck pain 01/22/2012  ? Chronic pain syndrome 12/27/2011  ? Diarrhea, episodic 12/27/2011  ? Edema of larynx 04/22/2011  ? Dysphagia 03/14/2011  ? DEPRESSION 04/24/2010  ? [place]itis ([place]/16/2011  ? Osteoporosis 11/11/2008  ? Spondylolisthesis, lumbar region 07/28/2008  ? 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/2007  ? DEGENERATIVE DISC DISEASE, CERVICAL SPINE 06/29/2007  ? Hyperlipidemia 02/03/2007  ? VARCIES, [place]/2008  ? Anxiety state 02/27/2006  ? Essential hypertension 02/27/2006  ? GERD 02/27/2006  ? Crohn's disease of small intestine with  complication ([place]0/[id number] ? ?REFERRING DIAG: Z98.1 (ICD-10-CM) - S/P lumbar fusion  ? ?THERAPY DIAG:  ?No diagnosis found. ? ?PERTINENT HISTORY: prior back surgery, anxiety COPD, migranes, osteoporosis . hx of concussion  ? ?PRECAUTIONS: Z98.1 (ICD-10-CM) - S/P lumbar fusion  ? ?SUBJECTIVE: Patient states she was sore all weekend from her last visit.   She did take a pain pill this morning. She reports doing some walking at home. She states she knows the exercise helps her.  ? ?PAIN:  ?Are you having pain? Yes: NPRS scale: 2-3/10 ?Pain location: leg and back  ?Pain description: sharp shooting  ?Aggravating factors: unknown ?Relieving factors: nothing  ? ?07/30/21 ?Supine: Ab set 10 paired with exhale ?  Bent knee  raise 10x 3" with ab set ?  Bridge 2 x 10 ?  Decompression 1-5  10 x 5" each ?  Decompression arm reaches/lengtheners  x 5" hold x 10 reps ?   Wall push aways ? ? Seated:  Tall posture ?  Wback 15x ? ? ? ? ? ? ?07/27/20 ? Supine: Ab set 10 paired with exhale ?  Bent knee raise 10x 3" with ab set ?  Bridge 10x, rest and then 5x ?  Decompression 1-5  10 x 5" each ? ? Seated:  Tall posture ?  Wback 15x ? ? ? ?3/22: ? Bed mobility- log rolling ? Supine: Ab set 10 paired with exhale ?  Bent knee raise 10x 3" with ab set ?  Bridge 10x ?  Decompression with RTB 5x 5" ? Seated:  Tall posture ?  Wback 15x ?   ? ?  ?07/20/2021 ?Standing :  wall arch ?Sitting:       tall posture x 10 ?                  Scapular retraction x 10 ?Supine:      Ab set x 10 ?                  Bent knee raise x 10  ?                  Bridge x 10 ?                  Decompression 1-5 x 5 each  ?                  Hamstring stretch 3 x 30"   ? ?[place] Adult PT Treatment/Exercise - 07/11/21 0001   ?  ?    ?     ?  Exercises  ?  Exercises Lumbar   ?     ?  Lumbar Exercises: Supine  ?  Ab Set 5 reps   ?  Other Supine Lumbar Exercises decompression 1-3   ?  ?   ?  ?  ?   ?  ?  ?  ?  ?  ?  ?  ?  ?  ?  ?  ?  PT Short Term Goals - 07/11/21 1427   ?   ?    ?     ?  PT SHORT TERM GOAL #1  ?  Title PT to be I in HEP to allow pt back pain to be no greater than a 5/10   ?  Time 3   ?  Status On going   ?  Target Date 08/01/21   ?     ?  PT SHORT TERM GOAL #2  ?  Titl

## 2021-08-14 ENCOUNTER — Encounter: Payer: Self-pay | Admitting: Family Medicine

## 2021-08-14 ENCOUNTER — Ambulatory Visit (INDEPENDENT_AMBULATORY_CARE_PROVIDER_SITE_OTHER): Payer: Medicare Other | Admitting: Family Medicine

## 2021-08-14 VITALS — BP 114/68 | HR 92 | Ht 60.0 in | Wt 110.0 lb

## 2021-08-14 DIAGNOSIS — R21 Rash and other nonspecific skin eruption: Secondary | ICD-10-CM | POA: Insufficient documentation

## 2021-08-14 DIAGNOSIS — S31819S Unspecified open wound of right buttock, sequela: Secondary | ICD-10-CM | POA: Diagnosis not present

## 2021-08-14 NOTE — Progress Notes (Signed)
? ?Acute Office Visit ? ?Subjective:  ? ? Patient ID: [name] [name], female    DOB: 05/13/1955, 66 y.o.   MRN: [id number] ? ?Chief Complaint  ?Patient presents with  ? Wound Check  ?  Complains of itching and wound not healing has been using fluticasone propionate cream.   ? ? ?HPI ?The patient is in today for a request to the wound clinic. The patient states that she has periods of flares and remissions of the wound that prevents her from attending PT. The wound is bright red, itchy, and oozes during a flare-up. The patient states that she has seen dermatology and does not know what triggered the flare-up. The patient applied topical corticosteroids to the site, which provided some relief. Today, the patient denies wound pain, drainage, or itching.  ? ?Past Medical History:  ?Diagnosis Date  ? Allergic rhinitis   ? Anastomotic ulcer JAN 2009  ? Anxiety   ? Arthritis   ? Asthma   ? BMI (body mass index) 20.0-29.9 2009 121 lbs  ? Concussion   ? 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 Crohn disease ([place])   ? CTS (carpal tunnel syndrome)   ? Diarrhea MULITIFACTORIAL  ? IBS, LACTOSE INTOLERANCE, SBBO, BILE-SALT  ? Elevated liver enzymes 2009: BMI 24 ?Etoh ALT 94, AST 51,  NEG ANA, qIGs& ASMA  ? MAY 2012 AST 52 ALT 38  ? Esophageal stricture 2009  ? GERD (gastroesophageal reflux disease)   ? Hemorrhoid   ? Hyperlipemia   ? Hypertension   ? Inflammatory bowel disease 2006 CD  ? SURGICAL REMISSION  ? LBP (low back pain)   ? Mental disorder   ? Migraine   ? Osteoporosis   ? PONV (postoperative nausea and vomiting)   ? Shortness of breath   ? exertion and humidity  ? Shoulder pain   ? right  ? Sleep apnea   ? Stop [employer] score of 4  ? ? ?Past Surgical History:  ?Procedure Laterality Date  ? BACK SURGERY    ? BACK SURGERY  07/16/2018  ? BILATERAL SALPINGOOPHORECTOMY    ? BIOPSY  12/24/2011  ? Procedure: BIOPSY;  Surgeon: Danie Binder, MD;  [facility];  Service: Endoscopy;;  Gastric Biopsies  ? BIOPSY N/A 06/30/2012   ? Procedure: BIOPSY;  Surgeon: Danie Binder, MD;  [facility]: [facility];  Service: Endoscopy;  Laterality: N/A;  Gastric and Esophageal Biopsies  ? CARPAL TUNNEL RELEASE  LEFT  ? CATARACT EXTRACTION W/PHACO Right 07/30/2016  ? Procedure: CATARACT EXTRACTION PHACO AND INTRAOCULAR LENS PLACEMENT (IOC);  Surgeon: Rutherford Guys, MD;  [facility];  Service: Ophthalmology;  Laterality: Right;  CDE: 5.45  ? CATARACT EXTRACTION W/PHACO Left 08/13/2016  ? Procedure: CATARACT EXTRACTION PHACO AND INTRAOCULAR LENS PLACEMENT (IOC);  Surgeon: Rutherford Guys, MD;  Location: [facility];  Service: Ophthalmology;  Laterality: Left;  CDE: 5.59  ? COLON SURGERY    ? COLONOSCOPY  JAN 2009 DIARRHEA, ABD PAIN, BRBPR  ? ANASTOMOTIC ULCER(3MM), IH UK:GURKYH ULCER, NL COLON bX  ? COLONOSCOPY WITH PROPOFOL N/A 04/21/2018  ? examined portion of the ileum normal, two 3-5 mm polyps, diverticulosis in the rectosigmoid and sigmoid colon, external and internal hemorrhoids, significant looping of the colon.  Pathology with tubular adenomas.  Recommendations to follow high-fiber diet and repeat colonoscopy in 5 to 10 years with peds colonoscope.  ? DILATION AND CURETTAGE OF UTERUS    ? ESOPHAGEAL DILATION  12/24/2011  ?  SLF: A stricture was found in the distal esophagus/ Moderate gastritis/ MILD Duodenitis  ? ESOPHAGOGASTRODUODENOSCOPY  02/07/09  ? mild gastritis  ? ESOPHAGOGASTRODUODENOSCOPY (EGD) WITH PROPOFOL N/A 06/30/2012  ? SLF: 1. No definite stricture appreciated 2. small hiatal hernia 3. Gastritis  ? ESOPHAGOGASTRODUODENOSCOPY (EGD) WITH PROPOFOL N/A 02/20/2016  ? Procedure: ESOPHAGOGASTRODUODENOSCOPY (EGD) WITH PROPOFOL;  Surgeon: Danie Binder, MD;  [facility];  Service: Endoscopy;  Laterality: N/A;  930  ? ESOPHAGOGASTRODUODENOSCOPY (EGD) WITH PROPOFOL N/A 11/24/2018  ? benign-appearing esophageal peptic stricture s/p dilation, small hiatal hernia, moderate erosive gastritis.   ? EYE SURGERY    ? gum flap Bilateral   ? HEMICOLECTOMY   RIGHT 2006  ? HERNIA REPAIR    ? LUMBAR DISC SURGERY    ? POLYPECTOMY  04/21/2018  ? Procedure: POLYPECTOMY;  Surgeon: Danie Binder, MD;  [facility];  Service: Endoscopy;;  (colon)  ? SAVORY DILATION N/A 06/30/2012  ? Procedure: SAVORY DILATION;  Surgeon: Danie Binder, MD;  [facility];  Service: Endoscopy;  Laterality: N/A;  12.69m , 115m 1534m? SAVORY DILATION N/A 02/20/2016  ? Procedure: SAVORY DILATION;  Surgeon: SanDanie BinderD;  [facility];  Service: Endoscopy;  Laterality: N/A;  ? SAVORY DILATION N/A 11/24/2018  ? Procedure: SAVORY DILATION;  Surgeon: FieDanie BinderD;  [facility];  Service: Endoscopy;  Laterality: N/A;  ? UPPER GASTROINTESTINAL ENDOSCOPY  JAN 2009 ABD PAIN  ? GASTRITIS, ESO RING  ? UPPER GASTROINTESTINAL ENDOSCOPY  OCT 2010 ABD PAIN, DYSPHAGIA  ? DIL 15MM, GASTRITIS, NL DUODENUM  ? UPPER GASTROINTESTINAL ENDOSCOPY  OCT 2010 DYSPHAGIA  ? DIL 17 MM, GASTRITIS, NL DUODENUM  ? vocal cord surgery  Sep 20, 2010  ? precancerous areas removed  ? wisdom tooth extraction Right   ? pin placement due to broken jaw  ? ? ?Family History  ?Problem Relation Age of Onset  ? Hypothyroidism Mother   ? Heart disease Father   ? Parkinson's disease Father   ? Hypothyroidism Daughter   ? Heart attack Paternal Grandfather   ? Alzheimer's disease Paternal Grandmother   ? Heart attack Maternal Grandfather   ? Crohn's disease Sister   ? Other Sister   ?     was murdered  ? Crohn's disease Maternal Uncle   ? Colon cancer Neg Hx   ? Colon polyps Neg Hx   ? ? ?Social History  ? ?Socioeconomic History  ? Marital status: Single  ?  Spouse name: WayPatrick [place] Number of children: 2  ? Years of education: GED  ? Highest education level: Not on file  ?Occupational History  ?  Comment: disabled  ?Tobacco Use  ? Smoking status: Every Day  ?  Packs/day: 0.25  ?  Years: 30.00  ?  Pack years: 7.50  ?  Types: Cigarettes  ? Smokeless tobacco: Never  ?Vaping Use  ? Vaping Use: Never  used  ?Substance and Sexual Activity  ? Alcohol use: Not Currently  ?  Comment: wine/beer a few times a week   ? Drug use: No  ? Sexual activity: Not Currently  ?  Birth control/protection: Post-menopausal  ?Other Topics Concern  ? Not on file  ?Social History Narrative  ? Lives with husband  ? no caffiene  ? ?Social Determinants of Health  ? ?Financial Resource Strain: Low Risk   ? Difficulty of Paying Living Expenses: Not hard at all  ?Food  Insecurity: No Food Insecurity  ? Worried About Charity fundraiser in the Last Year: Never true  ? Ran Out of Food in the Last Year: Never true  ?Transportation Needs: No Transportation Needs  ? Lack of Transportation (Medical): No  ? Lack of Transportation (Non-Medical): No  ?Physical Activity: Insufficiently Active  ? Days of Exercise per Week: 3 days  ? Minutes of Exercise per Session: 30 min  ?Stress: No Stress Concern Present  ? Feeling of Stress : Not at all  ?Social Connections: Moderately Integrated  ? Frequency of Communication with Friends and Family: More than three times a week  ? Frequency of Social Gatherings with Friends and Family: More than three times a week  ? Attends Religious Services: More than 4 times per year  ? Active Member of Clubs or Organizations: No  ? Attends Archivist Meetings: Never  ? Marital Status: Married  ?Intimate Partner Violence: Not At Risk  ? Fear of Current or Ex-Partner: No  ? Emotionally Abused: No  ? Physically Abused: No  ? Sexually Abused: No  ? ? ?Outpatient Medications Prior to Visit  ?Medication Sig Dispense Refill  ? albuterol (VENTOLIN HFA) 108 (90 Base) MCG/ACT inhaler Inhale 2 puffs into the lungs every 6 (six) hours as needed for wheezing or shortness of breath. 18 g 11  ? amLODipine (NORVASC) 10 MG tablet Take 1 tablet (10 mg total) by mouth daily. 90 tablet 1  ? benzonatate (TESSALON) 100 MG capsule TAKE 1 CAPSULE BY MOUTH TWICE DAILY AS NEEDED FOR COUGH 30 capsule 0  ? calcium carbonate (TUMS - DOSED IN MG  ELEMENTAL CALCIUM) 500 MG chewable tablet Chew 1 tablet (200 mg of elemental calcium total) by mouth 3 (three) times daily with meals. 90 tablet 6  ? cholestyramine (QUESTRAN) 4 GM/DOSE powder DISSOLVE

## 2021-08-14 NOTE — Patient Instructions (Signed)
I appreciate the opportunity to provide care to you today! ? ? ?-Referrals has been made to the wound clinic, they will call you to schedule an appt.  ?  ? ?  ?It was a pleasure to see you and I look forward to continuing to work together on your health and well-being. ?Please do not hesitate to call the office if you need care or have questions about your care. ?  ?Have a wonderful day and week. ?With Gratitude, ?Alvira Monday MSN, FNP-BC ? ?

## 2021-08-14 NOTE — Assessment & Plan Note (Addendum)
-  Rash noted to be healing well ?-No signs of infections noted ?-Continue to apply topical steroids to affected site ?-Referral made to wound care per patient's request ?

## 2021-08-16 ENCOUNTER — Encounter (HOSPITAL_COMMUNITY): Payer: Self-pay | Admitting: Physical Therapy

## 2021-08-16 ENCOUNTER — Ambulatory Visit (HOSPITAL_COMMUNITY): Payer: Medicare Other | Attending: Internal Medicine | Admitting: Physical Therapy

## 2021-08-16 DIAGNOSIS — M5416 Radiculopathy, lumbar region: Secondary | ICD-10-CM

## 2021-08-16 DIAGNOSIS — M6281 Muscle weakness (generalized): Secondary | ICD-10-CM

## 2021-08-16 NOTE — Therapy (Signed)
?OUTPATIENT PHYSICAL THERAPY TREATMENT NOTE ? ? ?Patient Name: [name] [name] ?MRN: [id number] ?DOB:04/28/1956, 66 y.o., female ?Today's Date: 08/16/2021 ? ?PCP: Lindell Spar, MD ?REFERRING PROVIDER: Lindell Spar, MD ? ?   ? ? Visit Number 5  ? Number of Visits 12   ? Date for PT Re-Evaluation 09/15/21   ? Authorization Type [facility] MEDICARE   ? Progress Note Due on Visit 10   ? PT Start Time 1115  ? PT Stop Time 1200  ? PT Time Calculation (min) 45 min  ? Activity Tolerance Patient tolerated treatment well   ? Behavior During Therapy [facility] for tasks assessed/performed   ? ?  ?  ? ?  ? ? ?Past Medical History:  ?Diagnosis Date  ? Allergic rhinitis   ? Anastomotic ulcer JAN 2009  ? Anxiety   ? Arthritis   ? Asthma   ? BMI (body mass index) 20.0-29.9 2009 121 lbs  ? Concussion   ? 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 Crohn disease ([place])   ? CTS (carpal tunnel syndrome)   ? Diarrhea MULITIFACTORIAL  ? IBS, LACTOSE INTOLERANCE, SBBO, BILE-SALT  ? Elevated liver enzymes 2009: BMI 24 ?Etoh ALT 94, AST 51,  NEG ANA, qIGs& ASMA  ? MAY 2012 AST 52 ALT 38  ? Esophageal stricture 2009  ? GERD (gastroesophageal reflux disease)   ? Hemorrhoid   ? Hyperlipemia   ? Hypertension   ? Inflammatory bowel disease 2006 CD  ? SURGICAL REMISSION  ? LBP (low back pain)   ? Mental disorder   ? Migraine   ? Osteoporosis   ? PONV (postoperative nausea and vomiting)   ? Shortness of breath   ? exertion and humidity  ? Shoulder pain   ? right  ? Sleep apnea   ? Stop [employer] score of 4  ? ?Past Surgical History:  ?Procedure Laterality Date  ? BACK SURGERY    ? BACK SURGERY  07/16/2018  ? BILATERAL SALPINGOOPHORECTOMY    ? BIOPSY  12/24/2011  ? Procedure: BIOPSY;  Surgeon: Danie Binder, MD;  [facility];  Service: Endoscopy;;  Gastric Biopsies  ? BIOPSY N/A 06/30/2012  ? Procedure: BIOPSY;  Surgeon: Danie Binder, MD;  Location: [facility];  Service: Endoscopy;  Laterality: N/A;  Gastric and Esophageal Biopsies  ? CARPAL TUNNEL RELEASE  LEFT   ? CATARACT EXTRACTION W/PHACO Right 07/30/2016  ? Procedure: CATARACT EXTRACTION PHACO AND INTRAOCULAR LENS PLACEMENT (IOC);  Surgeon: Rutherford Guys, MD;  Location: [facility];  Service: Ophthalmology;  Laterality: Right;  CDE: 5.45  ? CATARACT EXTRACTION W/PHACO Left 08/13/2016  ? Procedure: CATARACT EXTRACTION PHACO AND INTRAOCULAR LENS PLACEMENT (IOC);  Surgeon: Rutherford Guys, MD;  [facility]: [facility];  Service: Ophthalmology;  Laterality: Left;  CDE: 5.59  ? COLON SURGERY    ? COLONOSCOPY  JAN 2009 DIARRHEA, ABD PAIN, BRBPR  ? ANASTOMOTIC ULCER(3MM), IH LY:[id number] ULCER, NL COLON bX  ? COLONOSCOPY WITH PROPOFOL N/A 04/21/2018  ? examined portion of the ileum normal, two 3-5 mm polyps, diverticulosis in the rectosigmoid and sigmoid colon, external and internal hemorrhoids, significant looping of the colon.  Pathology with tubular adenomas.  Recommendations to follow high-fiber diet and repeat colonoscopy in 5 to 10 years with peds colonoscope.  ? DILATION AND CURETTAGE OF UTERUS    ? ESOPHAGEAL DILATION  12/24/2011  ? SLF: A stricture was found in the distal esophagus/ Moderate gastritis/ MILD Duodenitis  ? ESOPHAGOGASTRODUODENOSCOPY  02/07/09  ?  mild gastritis  ? ESOPHAGOGASTRODUODENOSCOPY (EGD) WITH PROPOFOL N/A 06/30/2012  ? SLF: 1. No definite stricture appreciated 2. small hiatal hernia 3. Gastritis  ? ESOPHAGOGASTRODUODENOSCOPY (EGD) WITH PROPOFOL N/A 02/20/2016  ? Procedure: ESOPHAGOGASTRODUODENOSCOPY (EGD) WITH PROPOFOL;  Surgeon: Danie Binder, MD;  [facility];  Service: Endoscopy;  Laterality: N/A;  930  ? ESOPHAGOGASTRODUODENOSCOPY (EGD) WITH PROPOFOL N/A 11/24/2018  ? benign-appearing esophageal peptic stricture s/p dilation, small hiatal hernia, moderate erosive gastritis.   ? EYE SURGERY    ? gum flap Bilateral   ? HEMICOLECTOMY  RIGHT 2006  ? HERNIA REPAIR    ? LUMBAR DISC SURGERY    ? POLYPECTOMY  04/21/2018  ? Procedure: POLYPECTOMY;  Surgeon: Danie Binder, MD;  [facility];   Service: Endoscopy;;  (colon)  ? SAVORY DILATION N/A 06/30/2012  ? Procedure: SAVORY DILATION;  Surgeon: Danie Binder, MD;  [facility];  Service: Endoscopy;  Laterality: N/A;  12.12m , 157m 1510m? SAVORY DILATION N/A 02/20/2016  ? Procedure: SAVORY DILATION;  Surgeon: SanDanie BinderD;  [facility];  Service: Endoscopy;  Laterality: N/A;  ? SAVORY DILATION N/A 11/24/2018  ? Procedure: SAVORY DILATION;  Surgeon: FieDanie BinderD;  [facility];  Service: Endoscopy;  Laterality: N/A;  ? UPPER GASTROINTESTINAL ENDOSCOPY  JAN 2009 ABD PAIN  ? GASTRITIS, ESO RING  ? UPPER GASTROINTESTINAL ENDOSCOPY  OCT 2010 ABD PAIN, DYSPHAGIA  ? DIL 15MM, GASTRITIS, NL DUODENUM  ? UPPER GASTROINTESTINAL ENDOSCOPY  OCT 2010 DYSPHAGIA  ? DIL 17 MM, GASTRITIS, NL DUODENUM  ? vocal cord surgery  Sep 20, 2010  ? precancerous areas removed  ? wisdom tooth extraction Right   ? pin placement due to broken jaw  ? ?Patient Active Problem List  ? Diagnosis Date Noted  ? Rash 08/14/2021  ? Hypothyroidism 12/13/2020  ? S/P lumbar fusion 10/19/2019  ? Seasonal allergies 09/01/2018  ? Chronic low back pain 04/07/2018  ? Perianal cyst 02/24/2018  ? Alcohol abuse 12/23/2017  ? Tobacco abuse 06/25/2017  ? Osteoarthritis 12/27/2014  ? Allergic dermatitis eyelid 12/27/2014  ? Abnormal CT scan, chest 01/12/2014  ? Polycythemia 09/08/2012  ? Vertigo 01/22/2012  ? Neck pain 01/22/2012  ? Chronic pain syndrome 12/27/2011  ? Diarrhea, episodic 12/27/2011  ? Edema of larynx 04/22/2011  ? Dysphagia 03/14/2011  ? DEPRESSION 04/24/2010  ? Chronic bronchitis ([place]9/16/2011  ? Osteoporosis 11/11/2008  ? Spondylolisthesis, lumbar region 07/28/2008  ? 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/2007  ? DEGENERATIVE DISC DISEASE, CERVICAL SPINE 06/29/2007  ? Hyperlipidemia 02/03/2007  ? VARCIES, SITE NECHart/27/2008  ? Anxiety state 02/27/2006  ? Essential hypertension 02/27/2006  ? GERD 02/27/2006  ? Crohn's disease of small  intestine with complication ([place]CCMammoth Spring0/22/97/9892 ? ?REFERRING DIAG: Z98.1 (ICD-10-CM) - S/P lumbar fusion  ? ?THERAPY DIAG:  ?Radiculopathy, lumbar region ? ?Muscle weakness (generalized) ? ?PERTINENT HISTORY: prior back surgery, anxiety COPD, migranes, osteoporosis . hx of concussion  ? ?PRECAUTIONS: Z98.1 (ICD-10-CM) - S/P lumbar fusion  ? ?SUBJECTIVE:  Pt has not came in for four weeks as she had a rash on her rt buttock. She has not been ?Able to complete all of her exercises due to her buttock pain  ?PAIN:  ?Are you having pain? Yes: NPRS scale: 4/10 ?Pain location: leg and back  ?Pain description: sharp shooting  ?Aggravating factors: unknown ?Relieving factors: nothing  ?08/16/2021 ?Observation/Other Assessments   ?  Focus  on Therapeutic Outcomes (FOTO)  34   ?     ?  Functional Tests  ?  Functional tests Single leg stance;Sit to Stand   ?     ?  Single Leg Stance  ?  Comments LT 0" was 0" , RT 3" was o  ?     ?  Sit to Stand  ?  Comments 6 in 30" wt on RT LE   ?     ?  Posture/Postural Control  ?  Posture/Postural Control Postural limitations   ?  Postural Limitations Rounded Shoulders;Forward head;Increased thoracic kyphosis;Flexed trunk   ?     ?  ROM / Strength  ?  AROM / PROM / Strength Strength;AROM   ?     ?  AROM  ?  AROM Assessment Site Lumbar   ?     ?  Strength  ?  Strength Assessment Site Hip;Knee;Ankle   ?  Right/Left Hip Right;Left   ?  Right Hip Flexion 3/5  was  3/5   ?  Right Hip Extension 3- was 2+/5   ?  Right Hip ABduction 3+/5 was 3+/5   ?  Left Hip Flexion 2+/5   ?  Left Hip Extension 2+/5 was 2/5   ?  Left Hip ABduction 3/5 was 2+/5   ?  Right/Left Knee Right;Left   ?  Right Knee Extension ?Right knee flexion  3+/5  ?3-/5 prior was not tested   ?  Left Knee Extension ?Left knee flexion 3+/5  ?3-/5 prior was not tested   ?  Right/Left Ankle Right;Left   ?  ?   ?  ? 08/15/21 ?  Supine:  Ab set x 10 ?                bent knee raise x 10 ?                Bridge x 10 ?  Prone:   glut set x 10 ?                 SLR x 5 B  ? Single leg stance 3 x B  ?   ?07/30/21 ?Supine: Ab set 10 paired with exhale ?  Bent knee raise 10x 3" with ab set ?  Bridge 2 x 10 ?  Decompression 1-5  10 x 5" each ?  Decompression

## 2021-08-21 ENCOUNTER — Ambulatory Visit (HOSPITAL_COMMUNITY): Payer: Medicare Other | Admitting: Physical Therapy

## 2021-08-21 DIAGNOSIS — M5416 Radiculopathy, lumbar region: Secondary | ICD-10-CM

## 2021-08-21 DIAGNOSIS — M6281 Muscle weakness (generalized): Secondary | ICD-10-CM | POA: Diagnosis not present

## 2021-08-21 NOTE — Therapy (Signed)
?OUTPATIENT PHYSICAL THERAPY TREATMENT NOTE ? ? ?Patient Name: [name] [name] ?MRN: [id number] ?DOB:October 23, 1955, 66 y.o., female ?Today's Date: 08/21/2021 ? ?PCP: Lindell Spar, MD ?REFERRING PROVIDER: Lindell Spar, MD ? ?   ? ? Visit Number 5  ? Number of Visits 12   ? Date for PT Re-Evaluation 09/15/21   ? Authorization Type [facility]c MEDICARE   ? Progress Note Due on Visit 10   ? PT Start Time 1115  ? PT Stop Time 1200  ? PT Time Calculation (min) 45 min  ? Activity Tolerance Patient tolerated treatment well   ? Behavior During Therapy [facility] for tasks assessed/performed   ? ?  ?  ? ?  ? ? ?Past Medical History:  ?Diagnosis Date  ? Allergic rhinitis   ? Anastomotic ulcer JAN 2009  ? Anxiety   ? Arthritis   ? Asthma   ? BMI (body mass index) 20.0-29.9 2009 121 lbs  ? Concussion   ? 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 Crohn disease ([place])   ? CTS (carpal tunnel syndrome)   ? Diarrhea MULITIFACTORIAL  ? IBS, LACTOSE INTOLERANCE, SBBO, BILE-SALT  ? Elevated liver enzymes 2009: BMI 24 ?Etoh ALT 94, AST 51,  NEG ANA, qIGs& ASMA  ? MAY 2012 AST 52 ALT 38  ? Esophageal stricture 2009  ? GERD (gastroesophageal reflux disease)   ? Hemorrhoid   ? Hyperlipemia   ? Hypertension   ? Inflammatory bowel disease 2006 CD  ? SURGICAL REMISSION  ? LBP (low back pain)   ? Mental disorder   ? Migraine   ? Osteoporosis   ? PONV (postoperative nausea and vomiting)   ? Shortness of breath   ? exertion and humidity  ? Shoulder pain   ? right  ? Sleep apnea   ? Stop [employer] score of 4  ? ?Past Surgical History:  ?Procedure Laterality Date  ? BACK SURGERY    ? BACK SURGERY  07/16/2018  ? BILATERAL SALPINGOOPHORECTOMY    ? BIOPSY  12/24/2011  ? Procedure: BIOPSY;  Surgeon: Danie Binder, MD;  [facility];  Service: Endoscopy;;  Gastric Biopsies  ? BIOPSY N/A 06/30/2012  ? Procedure: BIOPSY;  Surgeon: Danie Binder, MD;  Location: [facility];  Service: Endoscopy;  Laterality: N/A;  Gastric and Esophageal Biopsies  ? CARPAL TUNNEL RELEASE  LEFT   ? CATARACT EXTRACTION W/PHACO Right 07/30/2016  ? Procedure: CATARACT EXTRACTION PHACO AND INTRAOCULAR LENS PLACEMENT (IOC);  Surgeon: Rutherford Guys, MD;  [facility];  Service: Ophthalmology;  Laterality: Right;  CDE: 5.45  ? CATARACT EXTRACTION W/PHACO Left 08/13/2016  ? Procedure: CATARACT EXTRACTION PHACO AND INTRAOCULAR LENS PLACEMENT (IOC);  Surgeon: Rutherford Guys, MD;  [facility]: [facility];  Service: Ophthalmology;  Laterality: Left;  CDE: 5.59  ? COLON SURGERY    ? COLONOSCOPY  JAN 2009 DIARRHEA, ABD PAIN, BRBPR  ? ANASTOMOTIC ULCER(3MM), IH BZ:JIRCVE ULCER, NL COLON bX  ? COLONOSCOPY WITH PROPOFOL N/A 04/21/2018  ? examined portion of the ileum normal, two 3-5 mm polyps, diverticulosis in the rectosigmoid and sigmoid colon, external and internal hemorrhoids, significant looping of the colon.  Pathology with tubular adenomas.  Recommendations to follow high-fiber diet and repeat colonoscopy in 5 to 10 years with peds colonoscope.  ? DILATION AND CURETTAGE OF UTERUS    ? ESOPHAGEAL DILATION  12/24/2011  ? SLF: A stricture was found in the distal esophagus/ Moderate gastritis/ MILD Duodenitis  ? ESOPHAGOGASTRODUODENOSCOPY  02/07/09  ?  mild gastritis  ? ESOPHAGOGASTRODUODENOSCOPY (EGD) WITH PROPOFOL N/A 06/30/2012  ? SLF: 1. No definite stricture appreciated 2. small hiatal hernia 3. Gastritis  ? ESOPHAGOGASTRODUODENOSCOPY (EGD) WITH PROPOFOL N/A 02/20/2016  ? Procedure: ESOPHAGOGASTRODUODENOSCOPY (EGD) WITH PROPOFOL;  Surgeon: Danie Binder, MD;  [facility];  Service: Endoscopy;  Laterality: N/A;  930  ? ESOPHAGOGASTRODUODENOSCOPY (EGD) WITH PROPOFOL N/A 11/24/2018  ? benign-appearing esophageal peptic stricture s/p dilation, small hiatal hernia, moderate erosive gastritis.   ? EYE SURGERY    ? gum flap Bilateral   ? HEMICOLECTOMY  RIGHT 2006  ? HERNIA REPAIR    ? LUMBAR DISC SURGERY    ? POLYPECTOMY  04/21/2018  ? Procedure: POLYPECTOMY;  Surgeon: Danie Binder, MD;  [facility];   Service: Endoscopy;;  (colon)  ? SAVORY DILATION N/A 06/30/2012  ? Procedure: SAVORY DILATION;  Surgeon: Danie Binder, MD;  [facility];  Service: Endoscopy;  Laterality: N/A;  12.39m , 128m 1541m? SAVORY DILATION N/A 02/20/2016  ? Procedure: SAVORY DILATION;  Surgeon: SanDanie BinderD;  [facility];  Service: Endoscopy;  Laterality: N/A;  ? SAVORY DILATION N/A 11/24/2018  ? Procedure: SAVORY DILATION;  Surgeon: FieDanie BinderD;  [facility];  Service: Endoscopy;  Laterality: N/A;  ? UPPER GASTROINTESTINAL ENDOSCOPY  JAN 2009 ABD PAIN  ? GASTRITIS, ESO RING  ? UPPER GASTROINTESTINAL ENDOSCOPY  OCT 2010 ABD PAIN, DYSPHAGIA  ? DIL 15MM, GASTRITIS, NL DUODENUM  ? UPPER GASTROINTESTINAL ENDOSCOPY  OCT 2010 DYSPHAGIA  ? DIL 17 MM, GASTRITIS, NL DUODENUM  ? vocal cord surgery  Sep 20, 2010  ? precancerous areas removed  ? wisdom tooth extraction Right   ? pin placement due to broken jaw  ? ?Patient Active Problem List  ? Diagnosis Date Noted  ? Rash 08/14/2021  ? Hypothyroidism 12/13/2020  ? S/P lumbar fusion 10/19/2019  ? Seasonal allergies 09/01/2018  ? Chronic low back pain 04/07/2018  ? Perianal cyst 02/24/2018  ? Alcohol abuse 12/23/2017  ? Tobacco abuse 06/25/2017  ? Osteoarthritis 12/27/2014  ? Allergic dermatitis eyelid 12/27/2014  ? Abnormal CT scan, chest 01/12/2014  ? Polycythemia 09/08/2012  ? Vertigo 01/22/2012  ? Neck pain 01/22/2012  ? Chronic pain syndrome 12/27/2011  ? Diarrhea, episodic 12/27/2011  ? Edema of larynx 04/22/2011  ? Dysphagia 03/14/2011  ? DEPRESSION 04/24/2010  ? Chronic bronchitis ([place]CCPle[place]9/16/2011  ? Osteoporosis 11/11/2008  ? Spondylolisthesis, lumbar region 07/28/2008  ? 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/2007  ? DEGENERATIVE DISC DISEASE, CERVICAL SPINE 06/29/2007  ? Hyperlipidemia 02/03/2007  ? VARCIES, [place]2008  ? Anxiety state 02/27/2006  ? Essential hypertension 02/27/2006  ? GERD 02/27/2006  ? Crohn's disease of small  intestine with complication (HCCFort Denaud0/63/78/5885 ? ?REFERRING DIAG: Z98.1 (ICD-10-CM) - S/P lumbar fusion  ? ?THERAPY DIAG:  ?Radiculopathy, lumbar region ? ?Muscle weakness (generalized) ? ?PERTINENT HISTORY: prior back surgery, anxiety COPD, migranes, osteoporosis . hx of concussion  ? ?PRECAUTIONS: Z98.1 (ICD-10-CM) - S/P lumbar fusion  ? ?08/21/2021  ?SUBJECTIVE:  PT states that her rash on her bottom is improving ?PAIN:  ?Are you having pain? Yes: NPRS scale: 3/10 ?Pain location: leg and back  ?Pain description: sharp shooting  ?Aggravating factors: unknown ?Relieving factors: nothing  ? ?Treatment:   ?All four:  Mad cat/ old horse x 10 ?              Single arm raise  x 10 B ?              Single leg raise x 10 B ?Supine:  Using green t band:  from knee to chest to hip extension x 10 B ?               Using green tband hamstring stretch B x 3 for 30"; bring leg over to opposite side  ?               Stretching IT band 3 x 30" ?               Bridge x 15  ?Sitting:  thoracic excursions x 3 ?Standing:  wall arch x 10  ?               Forearm on counter hip extension x 10  ?08/16/2021 ?Observation/Other Assessments   ?  Focus on Therapeutic Outcomes (FOTO)  34   ?     ?  Functional Tests  ?  Functional tests Single leg stance;Sit to Stand   ?     ?  Single Leg Stance  ?  Comments LT 0" was 0" , RT 3" was o  ?     ?  Sit to Stand  ?  Comments 6 in 30" wt on RT LE   ?     ?  Posture/Postural Control  ?  Posture/Postural Control Postural limitations   ?  Postural Limitations Rounded Shoulders;Forward head;Increased thoracic kyphosis;Flexed trunk   ?     ?  ROM / Strength  ?  AROM / PROM / Strength Strength;AROM   ?     ?  AROM  ?  AROM Assessment Site Lumbar   ?     ?  Strength  ?  Strength Assessment Site Hip;Knee;Ankle   ?  Right/Left Hip Right;Left   ?  Right Hip Flexion 3/5  was  3/5   ?  Right Hip Extension 3- was 2+/5   ?  Right Hip ABduction 3+/5 was 3+/5   ?  Left Hip Flexion 2+/5   ?  Left Hip Extension 2+/5  was 2/5   ?  Left Hip ABduction 3/5 was 2+/5   ?  Right/Left Knee Right;Left   ?  Right Knee Extension ?Right knee flexion  3+/5  ?3-/5 prior was not tested   ?  Left Knee Extension ?Left knee flexion 3+/5  ?3-/5 p

## 2021-08-22 ENCOUNTER — Other Ambulatory Visit: Payer: Self-pay | Admitting: Internal Medicine

## 2021-08-22 DIAGNOSIS — J302 Other seasonal allergic rhinitis: Secondary | ICD-10-CM

## 2021-08-22 DIAGNOSIS — I10 Essential (primary) hypertension: Secondary | ICD-10-CM

## 2021-08-23 ENCOUNTER — Encounter (HOSPITAL_COMMUNITY): Payer: Self-pay

## 2021-08-23 ENCOUNTER — Ambulatory Visit (HOSPITAL_COMMUNITY): Payer: Medicare Other

## 2021-08-23 DIAGNOSIS — M5416 Radiculopathy, lumbar region: Secondary | ICD-10-CM | POA: Diagnosis not present

## 2021-08-23 DIAGNOSIS — M6281 Muscle weakness (generalized): Secondary | ICD-10-CM | POA: Diagnosis not present

## 2021-08-23 NOTE — Therapy (Signed)
?OUTPATIENT PHYSICAL THERAPY TREATMENT NOTE ? ? ?Patient Name: [name] [name] ?MRN: [id number] ?DOB:1955-12-07, 66 y.o., female ?Today's Date: 08/23/2021 ? ?PCP: Lindell Spar, MD ?REFERRING PROVIDER: Lindell Spar, MD ? ?   ? ? Visit Number 5  ? Number of Visits 12   ? Date for PT Re-Evaluation 09/15/21   ? Authorization Type [facility] MEDICARE   ? Progress Note Due on Visit 10   ? PT Start Time 1115  ? PT Stop Time 1200  ? PT Time Calculation (min) 45 min  ? Activity Tolerance Patient tolerated treatment well   ? Behavior During Therapy [facility] for tasks assessed/performed   ? ?  ?  ? ?  ? ? ?Past Medical History:  ?Diagnosis Date  ? Allergic rhinitis   ? Anastomotic ulcer JAN 2009  ? Anxiety   ? Arthritis   ? Asthma   ? BMI (body mass index) 20.0-29.9 2009 121 lbs  ? Concussion   ? 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 Crohn disease ([name])   ? CTS (carpal tunnel syndrome)   ? Diarrhea MULITIFACTORIAL  ? IBS, LACTOSE INTOLERANCE, SBBO, BILE-SALT  ? Elevated liver enzymes 2009: BMI 24 ?Etoh ALT 94, AST 51,  NEG ANA, qIGs& ASMA  ? MAY 2012 AST 52 ALT 38  ? Esophageal stricture 2009  ? GERD (gastroesophageal reflux disease)   ? Hemorrhoid   ? Hyperlipemia   ? Hypertension   ? Inflammatory bowel disease 2006 CD  ? SURGICAL REMISSION  ? LBP (low back pain)   ? Mental disorder   ? Migraine   ? Osteoporosis   ? PONV (postoperative nausea and vomiting)   ? Shortness of breath   ? exertion and humidity  ? Shoulder pain   ? right  ? Sleep apnea   ? Stop [employer] score of 4  ? ?Past Surgical History:  ?Procedure Laterality Date  ? BACK SURGERY    ? BACK SURGERY  07/16/2018  ? BILATERAL SALPINGOOPHORECTOMY    ? BIOPSY  12/24/2011  ? Procedure: BIOPSY;  Surgeon: Danie Binder, MD;  [facility];  Service: Endoscopy;;  Gastric Biopsies  ? BIOPSY N/A 06/30/2012  ? Procedure: BIOPSY;  Surgeon: Danie Binder, MD;  [facility];  Service: Endoscopy;  Laterality: N/A;  Gastric and Esophageal Biopsies  ? CARPAL TUNNEL RELEASE  LEFT   ? CATARACT EXTRACTION W/PHACO Right 07/30/2016  ? Procedure: CATARACT EXTRACTION PHACO AND INTRAOCULAR LENS PLACEMENT (IOC);  Surgeon: Rutherford Guys, MD;  Location: [facility];  Service: Ophthalmology;  Laterality: Right;  CDE: 5.45  ? CATARACT EXTRACTION W/PHACO Left 08/13/2016  ? Procedure: CATARACT EXTRACTION PHACO AND INTRAOCULAR LENS PLACEMENT (IOC);  Surgeon: Rutherford Guys, MD;  [facility];  Service: Ophthalmology;  Laterality: Left;  CDE: 5.59  ? COLON SURGERY    ? COLONOSCOPY  JAN 2009 DIARRHEA, ABD PAIN, BRBPR  ? ANASTOMOTIC ULCER(3MM), IH GQ:QPYPPJ ULCER, NL COLON bX  ? COLONOSCOPY WITH PROPOFOL N/A 04/21/2018  ? examined portion of the ileum normal, two 3-5 mm polyps, diverticulosis in the rectosigmoid and sigmoid colon, external and internal hemorrhoids, significant looping of the colon.  Pathology with tubular adenomas.  Recommendations to follow high-fiber diet and repeat colonoscopy in 5 to 10 years with peds colonoscope.  ? DILATION AND CURETTAGE OF UTERUS    ? ESOPHAGEAL DILATION  12/24/2011  ? SLF: A stricture was found in the distal esophagus/ Moderate gastritis/ MILD Duodenitis  ? ESOPHAGOGASTRODUODENOSCOPY  02/07/09  ?  mild gastritis  ? ESOPHAGOGASTRODUODENOSCOPY (EGD) WITH PROPOFOL N/A 06/30/2012  ? SLF: 1. No definite stricture appreciated 2. small hiatal hernia 3. Gastritis  ? ESOPHAGOGASTRODUODENOSCOPY (EGD) WITH PROPOFOL N/A 02/20/2016  ? Procedure: ESOPHAGOGASTRODUODENOSCOPY (EGD) WITH PROPOFOL;  Surgeon: Danie Binder, MD;  [facility];  Service: Endoscopy;  Laterality: N/A;  930  ? ESOPHAGOGASTRODUODENOSCOPY (EGD) WITH PROPOFOL N/A 11/24/2018  ? benign-appearing esophageal peptic stricture s/p dilation, small hiatal hernia, moderate erosive gastritis.   ? EYE SURGERY    ? gum flap Bilateral   ? HEMICOLECTOMY  RIGHT 2006  ? HERNIA REPAIR    ? LUMBAR DISC SURGERY    ? POLYPECTOMY  04/21/2018  ? Procedure: POLYPECTOMY;  Surgeon: Danie Binder, MD;  [facility];   Service: Endoscopy;;  (colon)  ? SAVORY DILATION N/A 06/30/2012  ? Procedure: SAVORY DILATION;  Surgeon: Danie Binder, MD;  [facility];  Service: Endoscopy;  Laterality: N/A;  12.51m , 160m 1582m? SAVORY DILATION N/A 02/20/2016  ? Procedure: SAVORY DILATION;  Surgeon: SanDanie BinderD;  [facility];  Service: Endoscopy;  Laterality: N/A;  ? SAVORY DILATION N/A 11/24/2018  ? Procedure: SAVORY DILATION;  Surgeon: FieDanie BinderD;  [facility];  Service: Endoscopy;  Laterality: N/A;  ? UPPER GASTROINTESTINAL ENDOSCOPY  JAN 2009 ABD PAIN  ? GASTRITIS, ESO RING  ? UPPER GASTROINTESTINAL ENDOSCOPY  OCT 2010 ABD PAIN, DYSPHAGIA  ? DIL 15MM, GASTRITIS, NL DUODENUM  ? UPPER GASTROINTESTINAL ENDOSCOPY  OCT 2010 DYSPHAGIA  ? DIL 17 MM, GASTRITIS, NL DUODENUM  ? vocal cord surgery  Sep 20, 2010  ? precancerous areas removed  ? wisdom tooth extraction Right   ? pin placement due to broken jaw  ? ?Patient Active Problem List  ? Diagnosis Date Noted  ? Rash 08/14/2021  ? Hypothyroidism 12/13/2020  ? S/P lumbar fusion 10/19/2019  ? Seasonal allergies 09/01/2018  ? Chronic low back pain 04/07/2018  ? Perianal cyst 02/24/2018  ? Alcohol abuse 12/23/2017  ? Tobacco abuse 06/25/2017  ? Osteoarthritis 12/27/2014  ? Allergic dermatitis eyelid 12/27/2014  ? Abnormal CT scan, chest 01/12/2014  ? Polycythemia 09/08/2012  ? Vertigo 01/22/2012  ? Neck pain 01/22/2012  ? Chronic pain syndrome 12/27/2011  ? Diarrhea, episodic 12/27/2011  ? Edema of larynx 04/22/2011  ? Dysphagia 03/14/2011  ? DEPRESSION 04/24/2010  ? Chronic bronchitis (HCC[place]ffs9/16/2011  ? Osteoporosis 11/11/2008  ? Spondylolisthesis, lumbar region 07/28/2008  ? 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/2007  ? DEGENERATIVE DISC DISEASE, CERVICAL SPINE 06/29/2007  ? Hyperlipidemia 02/03/2007  ? [place]  ? Anxiety state 02/27/2006  ? Essential hypertension 02/27/2006  ? GERD 02/27/2006  ? Crohn's disease of small  intestine with complication (HCCCollingsworth0/02/58/5277 ? ?REFERRING DIAG: Z98.1 (ICD-10-CM) - S/P lumbar fusion  ? ?THERAPY DIAG:  ?No diagnosis found. ? ?PERTINENT HISTORY: prior back surgery, anxiety COPD, migranes, osteoporosis . hx of concussion  ? ?PRECAUTIONS: Z98.1 (ICD-10-CM) - S/P lumbar fusion  ? ? ?SUBJECTIVE:  Pt stated she is hurting all over.  Reports spot on buttock ?PAIN:  ?Are you having pain? Yes: NPRS scale: 5/10 ?Pain location: leg and back  ?Pain description: sharp achey throbbing  ?Aggravating factors: unknown ?Relieving factors: nothing  ? ?Treatment:   ?08/23/21: ?Quadruped:  Mad cat/ old horse x 10 ? UE/LE 10x 5" ?Child's pose 3x 30" ?Standing: wall arch with heel raise 10x 5" ? Wall squat  10x5" ? Forearm on counter hip extension x 10  ?Supine: Bridge 15x ?08/21/21: ?All four:  22 cat/ old horse x 10 ?              Single arm raise x 10 B ?              Single leg raise x 10 B ?Supine:  Using green t band:  from knee to chest to hip extension x 10 B ?               Using green tband hamstring stretch B x 3 for 30"; bring leg over to opposite side  ?               Stretching IT band 3 x 30" ?               Bridge x 15  ?Sitting:  thoracic excursions x 3 ?Standing:  wall arch x 10  ?               Forearm on counter hip extension x 10  ?08/16/2021 ?Observation/Other Assessments   ?  Focus on Therapeutic Outcomes (FOTO)  34   ?     ?  Functional Tests  ?  Functional tests Single leg stance;Sit to Stand   ?     ?  Single Leg Stance  ?  Comments LT 0" was 0" , RT 3" was o  ?     ?  Sit to Stand  ?  Comments 6 in 30" wt on RT LE   ?     ?  Posture/Postural Control  ?  Posture/Postural Control Postural limitations   ?  Postural Limitations Rounded Shoulders;Forward head;Increased thoracic kyphosis;Flexed trunk   ?     ?  ROM / Strength  ?  AROM / PROM / Strength Strength;AROM   ?     ?  AROM  ?  AROM Assessment Site Lumbar   ?     ?  Strength  ?  Strength Assessment Site Hip;Knee;Ankle   ?  Right/Left Hip  Right;Left   ?  Right Hip Flexion 3/5  was  3/5   ?  Right Hip Extension 3- was 2+/5   ?  Right Hip ABduction 3+/5 was 3+/5   ?  Left Hip Flexion 2+/5   ?  Left Hip Extension 2+/5 was 2/5   ?  Left Hip ABduction 3

## 2021-08-24 DIAGNOSIS — M545 Low back pain, unspecified: Secondary | ICD-10-CM | POA: Diagnosis not present

## 2021-08-24 DIAGNOSIS — Z79899 Other long term (current) drug therapy: Secondary | ICD-10-CM | POA: Diagnosis not present

## 2021-08-24 DIAGNOSIS — G894 Chronic pain syndrome: Secondary | ICD-10-CM | POA: Diagnosis not present

## 2021-08-24 DIAGNOSIS — M4326 Fusion of spine, lumbar region: Secondary | ICD-10-CM | POA: Diagnosis not present

## 2021-08-24 DIAGNOSIS — M5442 Lumbago with sciatica, left side: Secondary | ICD-10-CM | POA: Diagnosis not present

## 2021-08-28 ENCOUNTER — Ambulatory Visit (HOSPITAL_COMMUNITY): Payer: Medicare Other

## 2021-08-28 ENCOUNTER — Encounter (HOSPITAL_COMMUNITY): Payer: Self-pay

## 2021-08-28 DIAGNOSIS — M6281 Muscle weakness (generalized): Secondary | ICD-10-CM

## 2021-08-28 DIAGNOSIS — M5416 Radiculopathy, lumbar region: Secondary | ICD-10-CM | POA: Diagnosis not present

## 2021-08-28 NOTE — Therapy (Signed)
?OUTPATIENT PHYSICAL THERAPY TREATMENT NOTE ? ? ?Patient Name: [name] [name] ?MRN: [id number] ?DOB:Jan 06, 1956, 66 y.o., female ?Today's Date: 08/28/2021 ? ?PCP: Lindell Spar, MD ?REFERRING PROVIDER: Lindell Spar, MD ? ?   ? ? Visit Number 5  ? Number of Visits 12   ? Date for PT Re-Evaluation 09/15/21   ? Authorization Type [facility] MEDICARE   ? Progress Note Due on Visit 10   ? PT Start Time 1115  ? PT Stop Time 1200  ? PT Time Calculation (min) 45 min  ? Activity Tolerance Patient tolerated treatment well   ? Behavior During Therapy [facility] for tasks assessed/performed   ? ?  ?  ? ?  ? ? ?Past Medical History:  ?Diagnosis Date  ? Allergic rhinitis   ? Anastomotic ulcer JAN 2009  ? Anxiety   ? Arthritis   ? Asthma   ? BMI (body mass index) 20.0-29.9 2009 121 lbs  ? Concussion   ? 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 Crohn disease ([place])   ? CTS (carpal tunnel syndrome)   ? Diarrhea MULITIFACTORIAL  ? IBS, LACTOSE INTOLERANCE, SBBO, BILE-SALT  ? Elevated liver enzymes 2009: BMI 24 ?Etoh ALT 94, AST 51,  NEG ANA, qIGs& ASMA  ? MAY 2012 AST 52 ALT 38  ? Esophageal stricture 2009  ? GERD (gastroesophageal reflux disease)   ? Hemorrhoid   ? Hyperlipemia   ? Hypertension   ? Inflammatory bowel disease 2006 CD  ? SURGICAL REMISSION  ? LBP (low back pain)   ? Mental disorder   ? Migraine   ? Osteoporosis   ? PONV (postoperative nausea and vomiting)   ? Shortness of breath   ? exertion and humidity  ? Shoulder pain   ? right  ? Sleep apnea   ? Stop [employer] score of 4  ? ?Past Surgical History:  ?Procedure Laterality Date  ? BACK SURGERY    ? BACK SURGERY  07/16/2018  ? BILATERAL SALPINGOOPHORECTOMY    ? BIOPSY  12/24/2011  ? Procedure: BIOPSY;  Surgeon: Danie Binder, MD;  [facility];  Service: Endoscopy;;  Gastric Biopsies  ? BIOPSY N/A 06/30/2012  ? Procedure: BIOPSY;  Surgeon: Danie Binder, MD;  [facility];  Service: Endoscopy;  Laterality: N/A;  Gastric and Esophageal Biopsies  ? CARPAL TUNNEL RELEASE  LEFT   ? CATARACT EXTRACTION W/PHACO Right 07/30/2016  ? Procedure: CATARACT EXTRACTION PHACO AND INTRAOCULAR LENS PLACEMENT (IOC);  Surgeon: Rutherford Guys, MD;  [facility];  Service: Ophthalmology;  Laterality: Right;  CDE: 5.45  ? CATARACT EXTRACTION W/PHACO Left 08/13/2016  ? Procedure: CATARACT EXTRACTION PHACO AND INTRAOCULAR LENS PLACEMENT (IOC);  Surgeon: Rutherford Guys, MD;  [facility];  Service: Ophthalmology;  Laterality: Left;  CDE: 5.59  ? COLON SURGERY    ? COLONOSCOPY  JAN 2009 DIARRHEA, ABD PAIN, BRBPR  ? ANASTOMOTIC ULCER(3MM), IH TL:XBWIOM ULCER, NL COLON bX  ? COLONOSCOPY WITH PROPOFOL N/A 04/21/2018  ? examined portion of the ileum normal, two 3-5 mm polyps, diverticulosis in the rectosigmoid and sigmoid colon, external and internal hemorrhoids, significant looping of the colon.  Pathology with tubular adenomas.  Recommendations to follow high-fiber diet and repeat colonoscopy in 5 to 10 years with peds colonoscope.  ? DILATION AND CURETTAGE OF UTERUS    ? ESOPHAGEAL DILATION  12/24/2011  ? SLF: A stricture was found in the distal esophagus/ Moderate gastritis/ MILD Duodenitis  ? ESOPHAGOGASTRODUODENOSCOPY  02/07/09  ?  mild gastritis  ? ESOPHAGOGASTRODUODENOSCOPY (EGD) WITH PROPOFOL N/A 06/30/2012  ? SLF: 1. No definite stricture appreciated 2. small hiatal hernia 3. Gastritis  ? ESOPHAGOGASTRODUODENOSCOPY (EGD) WITH PROPOFOL N/A 02/20/2016  ? Procedure: ESOPHAGOGASTRODUODENOSCOPY (EGD) WITH PROPOFOL;  Surgeon: Danie Binder, MD;  [facility];  Service: Endoscopy;  Laterality: N/A;  930  ? ESOPHAGOGASTRODUODENOSCOPY (EGD) WITH PROPOFOL N/A 11/24/2018  ? benign-appearing esophageal peptic stricture s/p dilation, small hiatal hernia, moderate erosive gastritis.   ? EYE SURGERY    ? gum flap Bilateral   ? HEMICOLECTOMY  RIGHT 2006  ? HERNIA REPAIR    ? LUMBAR DISC SURGERY    ? POLYPECTOMY  04/21/2018  ? Procedure: POLYPECTOMY;  Surgeon: Danie Binder, MD;  [facility];   Service: Endoscopy;;  (colon)  ? SAVORY DILATION N/A 06/30/2012  ? Procedure: SAVORY DILATION;  Surgeon: Danie Binder, MD;  [facility];  Service: Endoscopy;  Laterality: N/A;  12.38m , 14m 1570m? SAVORY DILATION N/A 02/20/2016  ? Procedure: SAVORY DILATION;  Surgeon: SanDanie BinderD;  [facility];  Service: Endoscopy;  Laterality: N/A;  ? SAVORY DILATION N/A 11/24/2018  ? Procedure: SAVORY DILATION;  Surgeon: FieDanie BinderD;  [facility];  Service: Endoscopy;  Laterality: N/A;  ? UPPER GASTROINTESTINAL ENDOSCOPY  JAN 2009 ABD PAIN  ? GASTRITIS, ESO RING  ? UPPER GASTROINTESTINAL ENDOSCOPY  OCT 2010 ABD PAIN, DYSPHAGIA  ? DIL 15MM, GASTRITIS, NL DUODENUM  ? UPPER GASTROINTESTINAL ENDOSCOPY  OCT 2010 DYSPHAGIA  ? DIL 17 MM, GASTRITIS, NL DUODENUM  ? vocal cord surgery  Sep 20, 2010  ? precancerous areas removed  ? wisdom tooth extraction Right   ? pin placement due to broken jaw  ? ?Patient Active Problem List  ? Diagnosis Date Noted  ? Rash 08/14/2021  ? Hypothyroidism 12/13/2020  ? S/P lumbar fusion 10/19/2019  ? Seasonal allergies 09/01/2018  ? Chronic low back pain 04/07/2018  ? Perianal cyst 02/24/2018  ? Alcohol abuse 12/23/2017  ? Tobacco abuse 06/25/2017  ? Osteoarthritis 12/27/2014  ? Allergic dermatitis eyelid 12/27/2014  ? Abnormal CT scan, chest 01/12/2014  ? Polycythemia 09/08/2012  ? Vertigo 01/22/2012  ? Neck pain 01/22/2012  ? Chronic pain syndrome 12/27/2011  ? Diarrhea, episodic 12/27/2011  ? Edema of larynx 04/22/2011  ? Dysphagia 03/14/2011  ? DEPRESSION 04/24/2010  ? Chronic bronchitis (HCCAllen Park9/16/2011  ? Osteoporosis 11/11/2008  ? Spondylolisthesis, lumbar region 07/28/2008  ? 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/2007  ? DEGENERATIVE DISC DISEASE, CERVICAL SPINE 06/29/2007  ? Hyperlipidemia 02/03/2007  ? VARCIES, [place]27/2008  ? Anxiety state 02/27/2006  ? Essential hypertension 02/27/2006  ? GERD 02/27/2006  ? Crohn's disease of small  intestine with complication (HCCDelaware Park0/69/79/4801 ? ?REFERRING DIAG: Z98.1 (ICD-10-CM) - S/P lumbar fusion  ? ?THERAPY DIAG:  ?Radiculopathy, lumbar region ? ?Muscle weakness (generalized) ? ?PERTINENT HISTORY: prior back surgery, anxiety COPD, migranes, osteoporosis . hx of concussion  ? ?PRECAUTIONS: Z98.1 (ICD-10-CM) - S/P lumbar fusion  ? ? ?SUBJECTIVE:  Pt stated her stomach and Lt hip are bothering her today.  Reports ?PAIN:  ?Are you having pain? Yes: NPRS scale: 5/10 ?Pain location: abdominal and Lt hip ?Pain description: sharp achey throbbing  ?Aggravating factors: unknown ?Relieving factors: nothing  ? ?Treatment:   ?08/28/21: ?Standing: ? RTB scapular retraction ?  Rows ?  Shoulder extension 15x 5" ? wall arch with heel raise 10x 5" ?  3D hip excursion 10x ?Quadruped:  Mad cat/ old horse x 10 ? UE/LE 10x 5"  ?Supine: Bridge 10x 5 ? Piriformis stretch 2x30" painful Lt groin ? Thomas stretch Lt hip flexor 2x 30" ?  ?08/23/21: ?Quadruped:  Mad cat/ old horse x 10 ? UE/LE 10x 5" ?Child's pose 3x 30" ?Standing: wall arch with heel raise 10x 5" ? Wall squat 10x5" ? Forearm on counter hip extension x 10  ?Supine: Bridge 15x ?08/21/21: ?All four:  69 cat/ old horse x 10 ?              Single arm raise x 10 B ?              Single leg raise x 10 B ?Supine:  Using green t band:  from knee to chest to hip extension x 10 B ?               Using green tband hamstring stretch B x 3 for 30"; bring leg over to opposite side  ?               Stretching IT band 3 x 30" ?               Bridge x 15  ?Sitting:  thoracic excursions x 3 ?Standing:  wall arch x 10  ?               Forearm on counter hip extension x 10  ?08/16/2021 ?Observation/Other Assessments   ?  Focus on Therapeutic Outcomes (FOTO)  34   ?     ?  Functional Tests  ?  Functional tests Single leg stance;Sit to Stand   ?     ?  Single Leg Stance  ?  Comments LT 0" was 0" , RT 3" was o  ?     ?  Sit to Stand  ?  Comments 6 in 30" wt on RT LE   ?     ?   Posture/Postural Control  ?  Posture/Postural Control Postural limitations   ?  Postural Limitations Rounded Shoulders;Forward head;Increased thoracic kyphosis;Flexed trunk   ?     ?  ROM / Strength  ?  AROM / PROM / Strength S

## 2021-08-29 ENCOUNTER — Telehealth: Payer: Self-pay | Admitting: Internal Medicine

## 2021-08-29 NOTE — Telephone Encounter (Signed)
Patient called in regard to  ? ?levothyroxine (SYNTHROID) 25 MCG tablet  ? ?Patient states that meds is giving her headaches after taking. ? ?Patient has not been taking med due to.  ? ?Wants to look into alternative med. ? ?Patient wants a call back. ?

## 2021-08-29 NOTE — Telephone Encounter (Signed)
Patient appt scheduled and patient aware  ?

## 2021-09-03 ENCOUNTER — Ambulatory Visit (HOSPITAL_COMMUNITY): Payer: Medicare Other | Attending: Internal Medicine

## 2021-09-03 DIAGNOSIS — M6281 Muscle weakness (generalized): Secondary | ICD-10-CM | POA: Diagnosis not present

## 2021-09-03 DIAGNOSIS — M5416 Radiculopathy, lumbar region: Secondary | ICD-10-CM | POA: Diagnosis not present

## 2021-09-03 NOTE — Therapy (Signed)
OUTPATIENT PHYSICAL THERAPY TREATMENT NOTE   Patient Name: [name] [name] MRN: [id number] DOB:Apr 14, 1956, 66 y.o., female Today's Date: 09/03/2021  PCP: Anabel Halon, MD REFERRING PROVIDER: Anabel Halon, MD  END OF SESSION:   PT End of Session - 09/03/21 1438     Visit Number 10    Number of Visits 12    Date for PT Re-Evaluation 09/15/21    Authorization Type [facility] MEDICARE    Progress Note Due on Visit 10    PT Start Time 1436    PT Stop Time 1520    PT Time Calculation (min) 44 min    Activity Tolerance Patient tolerated treatment well    Behavior During Therapy [facility] for tasks assessed/performed               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   Crohn disease (HCC)    CTS (carpal tunnel syndrome)    Diarrhea MULITIFACTORIAL   IBS, LACTOSE INTOLERANCE, SBBO, BILE-SALT   Elevated liver enzymes 2009: BMI 24 ?Etoh ALT 94, AST 51,  NEG ANA, qIGs& ASMA   MAY 2012 AST 52 ALT 38   Esophageal stricture 2009   GERD (gastroesophageal reflux disease)    Hemorrhoid    Hyperlipemia    Hypertension    Inflammatory bowel disease 2006 CD   SURGICAL REMISSION   LBP (low back pain)    Mental disorder    Migraine    Osteoporosis    PONV (postoperative nausea and vomiting)    Shortness of breath    exertion and humidity   Shoulder pain    right   Sleep apnea    Stop [employer] score of 4   Past Surgical History:  Procedure Laterality Date   BACK SURGERY     BACK SURGERY  07/16/2018   BILATERAL SALPINGOOPHORECTOMY     BIOPSY  12/24/2011   Procedure: BIOPSY;  Surgeon: West Bali, MD;  Location: [facility];  Service: Endoscopy;;  Gastric Biopsies   BIOPSY N/A 06/30/2012   Procedure: BIOPSY;  Surgeon: West Bali, MD;  Location: [facility];  Service: Endoscopy;  Laterality: N/A;   Gastric and Esophageal Biopsies   CARPAL TUNNEL RELEASE  LEFT   CATARACT EXTRACTION W/PHACO Right 07/30/2016   Procedure: CATARACT EXTRACTION PHACO AND INTRAOCULAR LENS PLACEMENT (IOC);  Surgeon: Jethro Bolus, MD;  Location: [facility];  Service: Ophthalmology;  Laterality: Right;  CDE: 5.45   CATARACT EXTRACTION W/PHACO Left 08/13/2016   Procedure: CATARACT EXTRACTION PHACO AND INTRAOCULAR LENS PLACEMENT (IOC);  Surgeon: Jethro Bolus, MD;  Location: [facility];  Service: Ophthalmology;  Laterality: Left;  CDE: 5.59   COLON SURGERY     COLONOSCOPY  JAN 2009 DIARRHEA, ABD PAIN, BRBPR   ANASTOMOTIC ULCER(3MM), IH WG:NFAOZH ULCER, NL COLON bX   COLONOSCOPY WITH PROPOFOL N/A 04/21/2018   examined portion of the ileum normal, two 3-5 mm polyps, diverticulosis in the rectosigmoid and sigmoid colon, external and internal hemorrhoids, significant looping of the colon.  Pathology with tubular adenomas.  Recommendations to follow high-fiber diet and repeat colonoscopy in 5 to 10 years with peds colonoscope.   DILATION AND CURETTAGE OF UTERUS     ESOPHAGEAL DILATION  12/24/2011   SLF:  A stricture was found in the distal esophagus/ Moderate gastritis/ MILD Duodenitis   ESOPHAGOGASTRODUODENOSCOPY  02/07/09   mild gastritis   ESOPHAGOGASTRODUODENOSCOPY (EGD) WITH PROPOFOL N/A 06/30/2012   SLF: 1. No definite stricture appreciated 2. small hiatal hernia 3. Gastritis   ESOPHAGOGASTRODUODENOSCOPY (EGD) WITH PROPOFOL N/A 02/20/2016   Procedure: ESOPHAGOGASTRODUODENOSCOPY (EGD) WITH PROPOFOL;  Surgeon: West Bali, MD;  [facility];  Service: Endoscopy;  Laterality: N/A;  930   ESOPHAGOGASTRODUODENOSCOPY (EGD) WITH PROPOFOL N/A 11/24/2018   benign-appearing esophageal peptic stricture s/p dilation, small hiatal hernia, moderate erosive gastritis.    EYE SURGERY     gum flap Bilateral    HEMICOLECTOMY  RIGHT 2006   HERNIA REPAIR     LUMBAR DISC SURGERY     POLYPECTOMY  04/21/2018   Procedure: POLYPECTOMY;   Surgeon: West Bali, MD;  [facility];  Service: Endoscopy;;  (colon)   SAVORY DILATION N/A 06/30/2012   Procedure: SAVORY DILATION;  Surgeon: West Bali, MD;  Location: [facility];  Service: Endoscopy;  Laterality: N/A;  12.31mm , 14mm, 15mm   SAVORY DILATION N/A 02/20/2016   Procedure: SAVORY DILATION;  Surgeon: West Bali, MD;  [facility];  Service: Endoscopy;  Laterality: N/A;   SAVORY DILATION N/A 11/24/2018   Procedure: SAVORY DILATION;  Surgeon: West Bali, MD;  [facility];  Service: Endoscopy;  Laterality: N/A;   UPPER GASTROINTESTINAL ENDOSCOPY  JAN 2009 ABD PAIN   GASTRITIS, ESO RING   UPPER GASTROINTESTINAL ENDOSCOPY  OCT 2010 ABD PAIN, DYSPHAGIA   DIL , GASTRITIS, NL DUODENUM   UPPER GASTROINTESTINAL ENDOSCOPY  OCT 2010 DYSPHAGIA   DIL 17 MM, GASTRITIS, NL DUODENUM   vocal cord surgery  Sep 20, 2010   precancerous areas removed   wisdom tooth extraction Right    pin placement due to broken jaw   Patient Active Problem List   Diagnosis Date Noted   Rash 08/14/2021   Hypothyroidism 12/13/2020   S/P lumbar fusion 10/19/2019   Seasonal allergies 09/01/2018   Chronic low back pain 04/07/2018   Perianal cyst 02/24/2018   Alcohol abuse 12/23/2017   Tobacco abuse 06/25/2017   Osteoarthritis 12/27/2014   Allergic dermatitis eyelid 12/27/2014   Abnormal CT scan, chest 01/12/2014   Polycythemia 09/08/2012   Vertigo 01/22/2012   Neck pain 01/22/2012   Chronic pain syndrome 12/27/2011   Diarrhea, episodic 12/27/2011   Edema of larynx 04/22/2011   Dysphagia 03/14/2011   DEPRESSION 04/24/2010   Chronic bronchitis (HCC) 01/19/2010   Osteoporosis 11/11/2008   Spondylolisthesis, lumbar region 07/28/2008   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/2007   DEGENERATIVE DISC DISEASE, CERVICAL SPINE 06/29/2007   Hyperlipidemia 02/03/2007   VARCIES, SITE NEC 09/30/2006   Anxiety state 02/27/2006   Essential hypertension  02/27/2006   GERD 02/27/2006   Crohn's disease of small intestine with complication (HCC) 02/27/2006    REFERRING DIAG: Z98.1 (ICD-10-CM) - S/P lumbar fusion   THERAPY DIAG:  Radiculopathy, lumbar region  Muscle weakness (generalized)  PERTINENT HISTORY: prior back surgery, anxiety COPD, migranes, osteoporosis . hx of concussion   PRECAUTIONS: Z98.1 (ICD-10-CM) - S/P lumbar fusion    SUBJECTIVE: Patient reports she is sore .  Patient quite emotional today as her family visited her over the weekend for her birthday; she has not seen them since 2019.  They left just this morning.  She was not able to do many of her exercises over the weekend with her  family in.   PAIN:  Are you having pain? Yes: NPRS scale: 3/10 Pain location: abdominal and Lt hip Pain description: sharp achey throbbing  Aggravating factors: unknown Relieving factors: nothing   Today's Treatment:   09/03/21 Supine: Head press 5" x 10 Shoulder press 5" x 10 Bridge x 10  Quadruped:   Mad cat/ old horse x 10 UE x 5" x 10 /LE x 5" x 10 Child's pose 3x 30"    08/28/21: Standing:  RTB scapular retraction   Rows   Shoulder extension 15x 5"  wall arch with heel raise 10x 5"  3D hip excursion 10x Quadruped:  Mad cat/ old horse x 10  UE/LE 10x 5"  Supine: Bridge 10x 5  Piriformis stretch 2x30" painful Lt groin  Thomas stretch Lt hip flexor 2x 30"     08/23/21: Quadruped:  Mad cat/ old horse x 10  UE/LE 10x 5" Child's pose 3x 30" Standing: wall arch with heel raise 10x 5"  Wall squat 10x5"  Forearm on counter hip extension x 10  Supine: Bridge 15x 08/21/21: All four:  Mad cat/ old horse x 10               Single arm raise x 10 B               Single leg raise x 10 B Supine:  Using green t band:  from knee to chest to hip extension x 10 B                Using green tband hamstring stretch B x 3 for 30"; bring leg over to opposite side                 Stretching IT band 3 x 30"                Bridge  x 15  Sitting:  thoracic excursions x 3 Standing:  wall arch x 10                 Forearm on counter hip extension x 10  08/16/2021 Observation/Other Assessments     Focus on Therapeutic Outcomes (FOTO)  34          Functional Tests    Functional tests Single leg stance;Sit to Stand          Single Leg Stance    Comments LT 0" was 0" , RT 3" was o         Sit to Stand    Comments 6 in 30" wt on RT LE          Posture/Postural Control    Posture/Postural Control Postural limitations     Postural Limitations Rounded Shoulders;Forward head;Increased thoracic kyphosis;Flexed trunk          ROM / Strength    AROM / PROM / Strength Strength;AROM          AROM    AROM Assessment Site Lumbar          Strength    Strength Assessment Site Hip;Knee;Ankle     Right/Left Hip Right;Left     Right Hip Flexion 3/5  was  3/5     Right Hip Extension 3- was 2+/5     Right Hip ABduction 3+/5 was 3+/5     Left Hip Flexion 2+/5     Left Hip Extension 2+/5 was 2/5     Left Hip ABduction 3/5 was 2+/5     Right/Left  Knee Right;Left     Right Knee Extension Right knee flexion  3+/5  3-/5 prior was not tested     Left Knee Extension Left knee flexion 3+/5  3-/5 prior was not tested     Right/Left Ankle Right;Left                       PT Short Term Goals - 07/11/21 1427                PT SHORT TERM GOAL #1    Title PT to be I in HEP to allow pt back pain to be no greater than a 5/10     Time 3     Status On going     Target Date 08/01/21          PT SHORT TERM GOAL #2    Title Pt to be able to sit in comfort for an hour to be able to watch television in comfort.     Time 3     Period Weeks     Status On going          PT SHORT TERM GOAL #3    Title PT to be walking for 15 minutes without increased pain daily to improve healthy lifestyle     Time 3     Period Weeks     Status On going                       PT Long Term Goals - 07/11/21 1525                 PT LONG TERM GOAL #1    Title PT to be I in an advance HEP to decrease back pain to no greater than a 3/10 for improved activity tolerance.     Time 6     Period Weeks     Status On going     Target Date 08/22/21          PT LONG TERM GOAL #2    Title PT radicular pain to be no further than her Left buttock to demonstrate decreased nerve irritation     Time 6     Period Weeks     Status On going          PT LONG TERM GOAL #3    Title PT to be able to demonstrate proper body mechanics for bed mobility and lifting to decrease stress on Low back to prevent future injury.     Time 6     Period Weeks     Status New          PT LONG TERM GOAL #4    Title Pt core and LE strength to increase to decrease stress on LOw back as noted by mm test as well as improved sit to stand.     Time 3     Period Weeks     Status On going          PT LONG TERM GOAL #5    Title PT single leg stance to be at least 8 seconds B to reduce risk of falling     Time 6     Period Weeks     Status On going  Plan        Clinical Impression Statement Today's Session continued to focus on lumbar stabilization.  Patient very exited to talk about her visit from her family and needs frequent redirect to task today. Trial of quadruped alternate arm and leg;  She is unable to perform quadruped alternate arm and leg without losing her balance . Patient will benefit from continued skilled therapy interventions to address deficits and improve functional mobility.       Personal Factors and Comorbidities Comorbidity 2;Fitness     Comorbidities past lumbar surgery, chronic pain syndrome, OA, osteoporosis     Examination-Activity Limitations Carry;Lift;Locomotion Level;Stand;Sit     Examination-Participation Restrictions Cleaning;Community Activity;Laundry;Yard Work     Conservation officer, historic buildings Evolving/Moderate complexity     Clinical Decision Making Moderate     Rehab  Potential Good     PT Frequency 2x / week     PT Duration 6 weeks     PT Treatment/Interventions Manual techniques;Patient/family education;Therapeutic exercise     PT Next Visit Plan Continue to progress core strengthening.     PT Home Exercise Plan 10th visit progress note next session.                    Patient will benefit from skilled therapeutic intervention in order to improve the following deficits and impairments:  Abnormal gait, Pain, Difficulty walking, Decreased activity tolerance, Decreased strength, Postural dysfunction, Improper body mechanics, Decreased balance   Visit Diagnosis: Radiculopathy, lumbar region   Muscle weakness (generalized)         Problem List     Patient Active Problem List    Diagnosis Date Noted   Hypothyroidism 12/13/2020   S/P lumbar fusion 10/19/2019   Seasonal allergies 09/01/2018   Chronic low back pain 04/07/2018   Perianal cyst 02/24/2018   Alcohol abuse 12/23/2017   Tobacco abuse 06/25/2017   Osteoarthritis 12/27/2014   Allergic dermatitis eyelid 12/27/2014   Abnormal CT scan, chest 01/12/2014   Polycythemia 09/08/2012   Vertigo 01/22/2012   Neck pain 01/22/2012   Chronic pain syndrome 12/27/2011   Diarrhea, episodic 12/27/2011   Edema of larynx 04/22/2011   Dysphagia 03/14/2011   DEPRESSION 04/24/2010   Chronic bronchitis (HCC) 01/19/2010   Osteoporosis 11/11/2008   Spondylolisthesis, lumbar region 07/28/2008   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/2007   DEGENERATIVE DISC DISEASE, CERVICAL SPINE 06/29/2007   Hyperlipidemia 02/03/2007   VARCIES, SITE NEC 09/30/2006   Anxiety state 02/27/2006   Essential hypertension 02/27/2006   GERD 02/27/2006   Crohn's disease of small intestine with complication (HCC) 02/27/2006    PLAN: continue to work on progressing core stabilization, lower extremity and core strengthening and lumbar mobility.   3:34 PM, 09/03/21 Charice Zuno Small Alezander Dimaano MPT  physical therapy [place]  [phone]

## 2021-09-04 ENCOUNTER — Ambulatory Visit (INDEPENDENT_AMBULATORY_CARE_PROVIDER_SITE_OTHER): Payer: Medicare Other | Admitting: Internal Medicine

## 2021-09-04 ENCOUNTER — Encounter: Payer: Self-pay | Admitting: Internal Medicine

## 2021-09-04 DIAGNOSIS — E039 Hypothyroidism, unspecified: Secondary | ICD-10-CM | POA: Diagnosis not present

## 2021-09-04 NOTE — Progress Notes (Signed)
?  ? ?Virtual Visit via Telephone Note  ? ?This visit type was conducted due to national recommendations for restrictions regarding the COVID-19 Pandemic (e.g. social distancing) in an effort to limit this patient's exposure and mitigate transmission in our community.  Due to her co-morbid illnesses, this patient is at least at moderate risk for complications without adequate follow up.  This format is felt to be most appropriate for this patient at this time.  The patient did not have access to video technology/had technical difficulties with video requiring transitioning to audio format only (telephone).  All issues noted in this document were discussed and addressed.  No physical exam could be performed with this format. ? ?Evaluation Performed:  Follow-up visit ? ?Date:  09/04/2021  ? ?ID:  [name] [name], DOB Sep 09, 1955, MRN [id number] ? ?Patient Location: Home ?Provider Location: Office/Clinic ? ?Participants: Patient ?Location of Patient: Home ?Location of Provider: Telehealth ?Consent was obtain for visit to be over via telehealth. ?I verified that I am speaking with the correct person using two identifiers. ? ?PCP:  Lindell Spar, MD  ? ?Chief Complaint: Headache ? ?History of Present Illness:   ? ?[name] [name] is a 66 y.o. female who has a televisit for c/o generalized headache, which is chronic and intermittent.  She stopped taking her levothyroxine and has had improvement in her headache.  She was started on levothyroxine after reviewing elevated TSH from [facility] screening. Her TSH has been wnl since starting Levothyroxine. She prefers to hold it for now.  She denies any change in her fatigue, weight or appetite. ? ?The patient does not have symptoms concerning for COVID-19 infection (fever, chills, cough, or new shortness of breath).  ? ?Past Medical, Surgical, Social History, Allergies, and Medications have been Reviewed. ? ?Past Medical History:  ?Diagnosis Date  ? Allergic rhinitis   ? Anastomotic  ulcer JAN 2009  ? Anxiety   ? Arthritis   ? Asthma   ? BMI (body mass index) 20.0-29.9 2009 121 lbs  ? Concussion   ? COPD with asthma ([place]) MAR 2011 PFTS  ? Crohn disease ([place])   ? CTS (carpal tunnel syndrome)   ? Diarrhea MULITIFACTORIAL  ? IBS, LACTOSE INTOLERANCE, SBBO, BILE-SALT  ? Elevated liver enzymes 2009: BMI 24 ?Etoh ALT 94, AST 51,  NEG ANA, qIGs& ASMA  ? MAY 2012 AST 52 ALT 38  ? Esophageal stricture 2009  ? GERD (gastroesophageal reflux disease)   ? Hemorrhoid   ? Hyperlipemia   ? Hypertension   ? Inflammatory bowel disease 2006 CD  ? SURGICAL REMISSION  ? LBP (low back pain)   ? Mental disorder   ? Migraine   ? Osteoporosis   ? PONV (postoperative nausea and vomiting)   ? Shortness of breath   ? exertion and humidity  ? Shoulder pain   ? right  ? Sleep apnea   ? Stop [employer] score of 4  ? ?Past Surgical History:  ?Procedure Laterality Date  ? BACK SURGERY    ? BACK SURGERY  07/16/2018  ? BILATERAL SALPINGOOPHORECTOMY    ? BIOPSY  12/24/2011  ? Procedure: BIOPSY;  Surgeon: Danie Binder, MD;  [facility];  Service: Endoscopy;;  Gastric Biopsies  ? BIOPSY N/A 06/30/2012  ? Procedure: BIOPSY;  Surgeon: Danie Binder, MD;  [facility]: [facility];  Service: Endoscopy;  Laterality: N/A;  Gastric and Esophageal Biopsies  ? CARPAL TUNNEL RELEASE  LEFT  ? CATARACT EXTRACTION W/PHACO Right  07/30/2016  ? Procedure: CATARACT EXTRACTION PHACO AND INTRAOCULAR LENS PLACEMENT (IOC);  Surgeon: Rutherford Guys, MD;  Location: [facility];  Service: Ophthalmology;  Laterality: Right;  CDE: 5.45  ? CATARACT EXTRACTION W/PHACO Left 08/13/2016  ? Procedure: CATARACT EXTRACTION PHACO AND INTRAOCULAR LENS PLACEMENT (IOC);  Surgeon: Rutherford Guys, MD;  Location: [facility];  Service: Ophthalmology;  Laterality: Left;  CDE: 5.59  ? COLON SURGERY    ? COLONOSCOPY  JAN 2009 DIARRHEA, ABD PAIN, BRBPR  ? ANASTOMOTIC ULCER(3MM), IH [id number] ULCER, NL COLON bX  ? COLONOSCOPY WITH PROPOFOL N/A 04/21/2018  ? examined portion of the ileum normal,  two 3-5 mm polyps, diverticulosis in the rectosigmoid and sigmoid colon, external and internal hemorrhoids, significant looping of the colon.  Pathology with tubular adenomas.  Recommendations to follow high-fiber diet and repeat colonoscopy in 5 to 10 years with peds colonoscope.  ? DILATION AND CURETTAGE OF UTERUS    ? ESOPHAGEAL DILATION  12/24/2011  ? SLF: A stricture was found in the distal esophagus/ Moderate gastritis/ MILD Duodenitis  ? ESOPHAGOGASTRODUODENOSCOPY  02/07/09  ? mild gastritis  ? ESOPHAGOGASTRODUODENOSCOPY (EGD) WITH PROPOFOL N/A 06/30/2012  ? SLF: 1. No definite stricture appreciated 2. small hiatal hernia 3. Gastritis  ? ESOPHAGOGASTRODUODENOSCOPY (EGD) WITH PROPOFOL N/A 02/20/2016  ? Procedure: ESOPHAGOGASTRODUODENOSCOPY (EGD) WITH PROPOFOL;  Surgeon: Danie Binder, MD;  [facility];  Service: Endoscopy;  Laterality: N/A;  930  ? ESOPHAGOGASTRODUODENOSCOPY (EGD) WITH PROPOFOL N/A 11/24/2018  ? benign-appearing esophageal peptic stricture s/p dilation, small hiatal hernia, moderate erosive gastritis.   ? EYE SURGERY    ? gum flap Bilateral   ? HEMICOLECTOMY  RIGHT 2006  ? HERNIA REPAIR    ? LUMBAR DISC SURGERY    ? POLYPECTOMY  04/21/2018  ? Procedure: POLYPECTOMY;  Surgeon: Danie Binder, MD;  [facility];  Service: Endoscopy;;  (colon)  ? SAVORY DILATION N/A 06/30/2012  ? Procedure: SAVORY DILATION;  Surgeon: Danie Binder, MD;  [facility]: [facility];  Service: Endoscopy;  Laterality: N/A;  12.49m , 162m 1552m? SAVORY DILATION N/A 02/20/2016  ? Procedure: SAVORY DILATION;  Surgeon: SanDanie BinderD;  [facility];  Service: Endoscopy;  Laterality: N/A;  ? SAVORY DILATION N/A 11/24/2018  ? Procedure: SAVORY DILATION;  Surgeon: FieDanie BinderD;  [facility];  Service: Endoscopy;  Laterality: N/A;  ? UPPER GASTROINTESTINAL ENDOSCOPY  JAN 2009 ABD PAIN  ? GASTRITIS, ESO RING  ? UPPER GASTROINTESTINAL ENDOSCOPY  OCT 2010 ABD PAIN, DYSPHAGIA  ? DIL  15MM, GASTRITIS, NL DUODENUM  ? UPPER GASTROINTESTINAL ENDOSCOPY  OCT 2010 DYSPHAGIA  ? DIL 17 MM, GASTRITIS, NL DUODENUM  ? vocal cord surgery  Sep 20, 2010  ? precancerous areas removed  ? wisdom tooth extraction Right   ? pin placement due to broken jaw  ?  ? ?Current Meds  ?Medication Sig  ? albuterol (VENTOLIN HFA) 108 (90 Base) MCG/ACT inhaler Inhale 2 puffs into the lungs every 6 (six) hours as needed for wheezing or shortness of breath.  ? amLODipine (NORVASC) 10 MG tablet Take 1 tablet by mouth once daily  ? benzonatate (TESSALON) 100 MG capsule TAKE 1 CAPSULE BY MOUTH TWICE DAILY AS NEEDED FOR COUGH  ? calcium carbonate (TUMS - DOSED IN MG ELEMENTAL CALCIUM) 500 MG chewable tablet Chew 1 tablet (200 mg of elemental calcium total) by mouth 3 (three) times daily with meals.  ? cholestyramine (QUESTRAN) 4 GM/DOSE powder  DISSOLVE AND TAKE 2 GRAMS BY MOUTH TWICE DAILY WITH A MEAL. DO NOT TAKE WITHIN 2 HOURS OF OTHER MEDICATIONS.  ? clotrimazole (CLOTRIMAZOLE AF) 1 % cream Apply 1 application topically 2 (two) times daily. To affected area for 1-2 weeks  ? cyclobenzaprine (FLEXERIL) 10 MG tablet Take 10 mg by mouth as needed for muscle spasms.   ? dexlansoprazole (DEXILANT) 60 MG capsule 1 PO EVERY MORNING WITH BREAKFAST.  ? diazepam (VALIUM) 5 MG tablet Take 5 mg by mouth every 12 (twelve) hours as needed for anxiety.  ? fluticasone (FLONASE) 50 MCG/ACT nasal spray USE TWO SPRAY(S) IN EACH NOSTRIL ONCE DAILY AS NEEDED FOR ALLERGY OR RHINITIS  ? gabapentin (NEURONTIN) 400 MG capsule Take 1 capsule by mouth 4 times daily  ? lactase (LACTAID) 3000 units tablet Take 3,000 Units by mouth daily as needed (lactose intolerance).  ? Lidocaine HCl 4 % LIQD Apply 1 application topically 3 (three) times daily as needed (pain).  ? loratadine (CLARITIN) 10 MG tablet Take 1 tablet (10 mg total) by mouth daily.  ? magnesium hydroxide (MILK OF MAGNESIA) 400 MG/5ML suspension Take 30 mLs by mouth daily as needed for mild  constipation.  ? Menthol 10 % AERO Apply 1 application topically 3 (three) times daily as needed (pain).  ? nystatin (MYCOSTATIN) 100000 UNIT/ML suspension TAKE 5 ML BY MOUTH  4 TIMES DAILY AS NEEDED FOR THRU

## 2021-09-04 NOTE — Assessment & Plan Note (Addendum)
Lab Results  ?Component Value Date  ? TSH 1.110 02/09/2021  ? ?Was controlled with Levothyroxine 25 mcg QD, but she has stopped taking it as she thought it was contributing to headache - resolved now ?Check TSH and free T4 in 6 weeks ?

## 2021-09-04 NOTE — Patient Instructions (Signed)
Please get fasting blood tests done before the next visit. ?

## 2021-09-06 ENCOUNTER — Ambulatory Visit (HOSPITAL_COMMUNITY): Payer: Medicare Other | Admitting: Physical Therapy

## 2021-09-06 DIAGNOSIS — M5416 Radiculopathy, lumbar region: Secondary | ICD-10-CM | POA: Diagnosis not present

## 2021-09-06 DIAGNOSIS — M6281 Muscle weakness (generalized): Secondary | ICD-10-CM

## 2021-09-06 NOTE — Therapy (Signed)
?OUTPATIENT PHYSICAL THERAPY TREATMENT NOTE ? ? ?Patient Name: [name] [name] ?MRN: [id number] ?DOB:1955-12-01, 66 y.o., female ?Today's Date: 09/06/2021 ? ?PCP: Lindell Spar, MD ?REFERRING PROVIDER: Lindell Spar, MD ? ?END OF SESSION:  ? PT End of Session - 09/06/21 1135   ? ? Visit Number 11   ? Number of Visits 16   ? Date for PT Re-Evaluation 09/15/21   ? Authorization Type [facility] MEDICARE; Progress note completed visit #6   ? Progress Note Due on Visit 16   ? PT Start Time 1054   ? PT Stop Time 1136   ? PT Time Calculation (min) 42 min   ? Activity Tolerance Patient tolerated treatment well   ? Behavior During Therapy [facility] for tasks assessed/performed   ? ?  ?  ? ?  ? ? ? ? ? ?Past Medical History:  ?Diagnosis Date  ? Allergic rhinitis   ? Anastomotic ulcer JAN 2009  ? Anxiety   ? Arthritis   ? Asthma   ? BMI (body mass index) 20.0-29.9 2009 121 lbs  ? Concussion   ? 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 Crohn disease ([place])   ? CTS (carpal tunnel syndrome)   ? Diarrhea MULITIFACTORIAL  ? IBS, LACTOSE INTOLERANCE, SBBO, BILE-SALT  ? Elevated liver enzymes 2009: BMI 24 ?Etoh ALT 94, AST 51,  NEG ANA, qIGs& ASMA  ? MAY 2012 AST 52 ALT 38  ? Esophageal stricture 2009  ? GERD (gastroesophageal reflux disease)   ? Hemorrhoid   ? Hyperlipemia   ? Hypertension   ? Inflammatory bowel disease 2006 CD  ? SURGICAL REMISSION  ? LBP (low back pain)   ? Mental disorder   ? Migraine   ? Osteoporosis   ? PONV (postoperative nausea and vomiting)   ? Shortness of breath   ? exertion and humidity  ? Shoulder pain   ? right  ? Sleep apnea   ? Stop [employer] score of 4  ? ?Past Surgical History:  ?Procedure Laterality Date  ? BACK SURGERY    ? BACK SURGERY  07/16/2018  ? BILATERAL SALPINGOOPHORECTOMY    ? BIOPSY  12/24/2011  ? Procedure: BIOPSY;  Surgeon: Danie Binder, MD;  [facility];  Service: Endoscopy;;  Gastric Biopsies  ? BIOPSY N/A 06/30/2012  ? Procedure: BIOPSY;  Surgeon: Danie Binder, MD;  Location: [facility];   Service: Endoscopy;  Laterality: N/A;  Gastric and Esophageal Biopsies  ? CARPAL TUNNEL RELEASE  LEFT  ? CATARACT EXTRACTION W/PHACO Right 07/30/2016  ? Procedure: CATARACT EXTRACTION PHACO AND INTRAOCULAR LENS PLACEMENT (IOC);  Surgeon: Rutherford Guys, MD;  [facility]: [facility];  Service: Ophthalmology;  Laterality: Right;  CDE: 5.45  ? CATARACT EXTRACTION W/PHACO Left 08/13/2016  ? Procedure: CATARACT EXTRACTION PHACO AND INTRAOCULAR LENS PLACEMENT (IOC);  Surgeon: Rutherford Guys, MD;  [facility];  Service: Ophthalmology;  Laterality: Left;  CDE: 5.59  ? COLON SURGERY    ? COLONOSCOPY  JAN 2009 DIARRHEA, ABD PAIN, BRBPR  ? ANASTOMOTIC ULCER(3MM), IH QA:STMHDQ ULCER, NL COLON bX  ? COLONOSCOPY WITH PROPOFOL N/A 04/21/2018  ? examined portion of the ileum normal, two 3-5 mm polyps, diverticulosis in the rectosigmoid and sigmoid colon, external and internal hemorrhoids, significant looping of the colon.  Pathology with tubular adenomas.  Recommendations to follow high-fiber diet and repeat colonoscopy in 5 to 10 years with peds colonoscope.  ? DILATION AND CURETTAGE OF UTERUS    ? ESOPHAGEAL  DILATION  12/24/2011  ? SLF: A stricture was found in the distal esophagus/ Moderate gastritis/ MILD Duodenitis  ? ESOPHAGOGASTRODUODENOSCOPY  02/07/09  ? mild gastritis  ? ESOPHAGOGASTRODUODENOSCOPY (EGD) WITH PROPOFOL N/A 06/30/2012  ? SLF: 1. No definite stricture appreciated 2. small hiatal hernia 3. Gastritis  ? ESOPHAGOGASTRODUODENOSCOPY (EGD) WITH PROPOFOL N/A 02/20/2016  ? Procedure: ESOPHAGOGASTRODUODENOSCOPY (EGD) WITH PROPOFOL;  Surgeon: Danie Binder, MD;  [facility];  Service: Endoscopy;  Laterality: N/A;  930  ? ESOPHAGOGASTRODUODENOSCOPY (EGD) WITH PROPOFOL N/A 11/24/2018  ? benign-appearing esophageal peptic stricture s/p dilation, small hiatal hernia, moderate erosive gastritis.   ? EYE SURGERY    ? gum flap Bilateral   ? HEMICOLECTOMY  RIGHT 2006  ? HERNIA REPAIR    ? LUMBAR DISC SURGERY    ? POLYPECTOMY   04/21/2018  ? Procedure: POLYPECTOMY;  Surgeon: Danie Binder, MD;  [facility];  Service: Endoscopy;;  (colon)  ? SAVORY DILATION N/A 06/30/2012  ? Procedure: SAVORY DILATION;  Surgeon: Danie Binder, MD;  [facility];  Service: Endoscopy;  Laterality: N/A;  12.55m , 176m 1567m? SAVORY DILATION N/A 02/20/2016  ? Procedure: SAVORY DILATION;  Surgeon: SanDanie BinderD;  [facility];  Service: Endoscopy;  Laterality: N/A;  ? SAVORY DILATION N/A 11/24/2018  ? Procedure: SAVORY DILATION;  Surgeon: FieDanie BinderD;  [facility];  Service: Endoscopy;  Laterality: N/A;  ? UPPER GASTROINTESTINAL ENDOSCOPY  JAN 2009 ABD PAIN  ? GASTRITIS, ESO RING  ? UPPER GASTROINTESTINAL ENDOSCOPY  OCT 2010 ABD PAIN, DYSPHAGIA  ? DIL 15MM, GASTRITIS, NL DUODENUM  ? UPPER GASTROINTESTINAL ENDOSCOPY  OCT 2010 DYSPHAGIA  ? DIL 17 MM, GASTRITIS, NL DUODENUM  ? vocal cord surgery  Sep 20, 2010  ? precancerous areas removed  ? wisdom tooth extraction Right   ? pin placement due to broken jaw  ? ?Patient Active Problem List  ? Diagnosis Date Noted  ? Rash 08/14/2021  ? Hypothyroidism 12/13/2020  ? S/P lumbar fusion 10/19/2019  ? Seasonal allergies 09/01/2018  ? Chronic low back pain 04/07/2018  ? Perianal cyst 02/24/2018  ? Alcohol abuse 12/23/2017  ? Tobacco abuse 06/25/2017  ? Osteoarthritis 12/27/2014  ? Allergic dermatitis eyelid 12/27/2014  ? Abnormal CT scan, chest 01/12/2014  ? Polycythemia 09/08/2012  ? Vertigo 01/22/2012  ? Neck pain 01/22/2012  ? Chronic pain syndrome 12/27/2011  ? Diarrhea, episodic 12/27/2011  ? Edema of larynx 04/22/2011  ? Dysphagia 03/14/2011  ? DEPRESSION 04/24/2010  ? Chronic bronchitis ([place]CC[place]9/16/2011  ? Osteoporosis 11/11/2008  ? Spondylolisthesis, lumbar region 07/28/2008  ? 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/2007  ? DEGENERATIVE DISC DISEASE, CERVICAL SPINE 06/29/2007  ? Hyperlipidemia 02/03/2007  ? [place]  ? Anxiety state  02/27/2006  ? Essential hypertension 02/27/2006  ? GERD 02/27/2006  ? Crohn's disease of small intestine with complication ([place]CC[place]0/16/02/9603 ? ?REFERRING DIAG: Z98.1 (ICD-10-CM) - S/P lumbar fusion  ? ?THERAPY DIAG:  ?Radiculopathy, lumbar region ? ?Muscle weakness (generalized) ? ?PERTINENT HISTORY: prior back surgery, anxiety COPD, migranes, osteoporosis . hx of concussion  ? ?PRECAUTIONS: Z98.1 (ICD-10-CM) - S/P lumbar fusion  ? ? ?SUBJECTIVE: Patient states her pain is up today in her back.  States they got stuck in construction this morning and mader her late, increasing her anxiety and pain.  STates she took 1/2 a pain pill and it has not kicked in yet. ? ?PAIN:  ?  Are you having pain? Yes: NPRS scale: 6/10 ?Pain location: abdominal and Lt hip ?Pain description: sharp achey throbbing  ?Aggravating factors: unknown ?Relieving factors: nothing  ?   ?TODAYS TREATMENT: ? ?09/06/21: ?Standing RTB scap retraction 10X ?RTB Rows 10X ?RTB extensions 10X ?Seated Blue physioball stretches forward and each side 5X10" each ?Quadruped mad cat old horse stretch 10X ? Ardine Eng pose 3X30" ? ?   ?09/03/21 ?Supine: ?Head press 5" x 10 ?Shoulder press 5" x 10 ?Bridge x 10 ?Quadruped:  ? Mad cat/ old horse x 10 ?UE x 5" x 10 /LE x 5" x 10 ?Child's pose 3x 30" ? ? ?08/28/21: ?Standing: ? RTB scapular retraction ?  Rows ?  Shoulder extension 15x 5" ? wall arch with heel raise 10x 5" ? 3D hip excursion 10x ?Quadruped:  Mad cat/ old horse x 10 ? UE/LE 10x 5"  ?Supine: Bridge 10x 5 ? Piriformis stretch 2x30" painful Lt groin ? Thomas stretch Lt hip flexor 2x 30" ? ? ?  ?08/23/21: ?Quadruped:  Mad cat/ old horse x 10 ? UE/LE 10x 5" ?Child's pose 3x 30" ?Standing: wall arch with heel raise 10x 5" ? Wall squat 10x5" ? Forearm on counter hip extension x 10  ?Supine: Bridge 15x ?08/21/21: ?All four:  33 cat/ old horse x 10 ?              Single arm raise x 10 B ?              Single leg raise x 10 B ?Supine:  Using green t band:  from knee to  chest to hip extension x 10 B ?               Using green tband hamstring stretch B x 3 for 30"; bring leg over to opposite side  ?               Stretching IT band 3 x 30" ?               Bridge x 15  ?Sitting:  th

## 2021-09-11 ENCOUNTER — Ambulatory Visit (HOSPITAL_COMMUNITY): Payer: Medicare Other | Admitting: Physical Therapy

## 2021-09-11 DIAGNOSIS — M6281 Muscle weakness (generalized): Secondary | ICD-10-CM | POA: Diagnosis not present

## 2021-09-11 DIAGNOSIS — M5416 Radiculopathy, lumbar region: Secondary | ICD-10-CM

## 2021-09-11 NOTE — Therapy (Signed)
?OUTPATIENT PHYSICAL THERAPY TREATMENT NOTE ? ? ?Patient Name: [name] [name] ?MRN: [id number] ?DOB:May 29, 1955, 66 y.o., female ?Today's Date: 09/11/2021 ? ?PCP: Lindell Spar, MD ?REFERRING PROVIDER: Lindell Spar, MD ? ?END OF SESSION:  ? PT End of Session - 09/11/21 1101   ? ? Visit Number 12   ? Number of Visits 16   ? Date for PT Re-Evaluation 09/15/21   ? Authorization Type [facility] MEDICARE; Progress note completed visit #6   ? Progress Note Due on Visit 16   ? PT Start Time 1055   ? PT Stop Time 4098   ? PT Time Calculation (min) 39 min   ? Activity Tolerance Patient tolerated treatment well   ? Behavior During Therapy [facility] for tasks assessed/performed   ? ?  ?  ? ?  ? ? ? ? ? ?Past Medical History:  ?Diagnosis Date  ? Allergic rhinitis   ? Anastomotic ulcer JAN 2009  ? Anxiety   ? Arthritis   ? Asthma   ? BMI (body mass index) 20.0-29.9 2009 121 lbs  ? Concussion   ? 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 Crohn disease ([place])   ? CTS (carpal tunnel syndrome)   ? Diarrhea MULITIFACTORIAL  ? IBS, LACTOSE INTOLERANCE, SBBO, BILE-SALT  ? Elevated liver enzymes 2009: BMI 24 ?Etoh ALT 94, AST 51,  NEG ANA, qIGs& ASMA  ? MAY 2012 AST 52 ALT 38  ? Esophageal stricture 2009  ? GERD (gastroesophageal reflux disease)   ? Hemorrhoid   ? Hyperlipemia   ? Hypertension   ? Inflammatory bowel disease 2006 CD  ? SURGICAL REMISSION  ? LBP (low back pain)   ? Mental disorder   ? Migraine   ? Osteoporosis   ? PONV (postoperative nausea and vomiting)   ? Shortness of breath   ? exertion and humidity  ? Shoulder pain   ? right  ? Sleep apnea   ? Stop [employer] score of 4  ? ?Past Surgical History:  ?Procedure Laterality Date  ? BACK SURGERY    ? BACK SURGERY  07/16/2018  ? BILATERAL SALPINGOOPHORECTOMY    ? BIOPSY  12/24/2011  ? Procedure: BIOPSY;  Surgeon: Danie Binder, MD;  [facility];  Service: Endoscopy;;  Gastric Biopsies  ? BIOPSY N/A 06/30/2012  ? Procedure: BIOPSY;  Surgeon: Danie Binder, MD;  [facility];   Service: Endoscopy;  Laterality: N/A;  Gastric and Esophageal Biopsies  ? CARPAL TUNNEL RELEASE  LEFT  ? CATARACT EXTRACTION W/PHACO Right 07/30/2016  ? Procedure: CATARACT EXTRACTION PHACO AND INTRAOCULAR LENS PLACEMENT (IOC);  Surgeon: Rutherford Guys, MD;  [facility];  Service: Ophthalmology;  Laterality: Right;  CDE: 5.45  ? CATARACT EXTRACTION W/PHACO Left 08/13/2016  ? Procedure: CATARACT EXTRACTION PHACO AND INTRAOCULAR LENS PLACEMENT (IOC);  Surgeon: Rutherford Guys, MD;  [facility];  Service: Ophthalmology;  Laterality: Left;  CDE: 5.59  ? COLON SURGERY    ? COLONOSCOPY  JAN 2009 DIARRHEA, ABD PAIN, BRBPR  ? ANASTOMOTIC ULCER(3MM), IH JX:BJYNWG ULCER, NL COLON bX  ? COLONOSCOPY WITH PROPOFOL N/A 04/21/2018  ? examined portion of the ileum normal, two 3-5 mm polyps, diverticulosis in the rectosigmoid and sigmoid colon, external and internal hemorrhoids, significant looping of the colon.  Pathology with tubular adenomas.  Recommendations to follow high-fiber diet and repeat colonoscopy in 5 to 10 years with peds colonoscope.  ? DILATION AND CURETTAGE OF UTERUS    ? ESOPHAGEAL  DILATION  12/24/2011  ? SLF: A stricture was found in the distal esophagus/ Moderate gastritis/ MILD Duodenitis  ? ESOPHAGOGASTRODUODENOSCOPY  02/07/09  ? mild gastritis  ? ESOPHAGOGASTRODUODENOSCOPY (EGD) WITH PROPOFOL N/A 06/30/2012  ? SLF: 1. No definite stricture appreciated 2. small hiatal hernia 3. Gastritis  ? ESOPHAGOGASTRODUODENOSCOPY (EGD) WITH PROPOFOL N/A 02/20/2016  ? Procedure: ESOPHAGOGASTRODUODENOSCOPY (EGD) WITH PROPOFOL;  Surgeon: Danie Binder, MD;  [facility];  Service: Endoscopy;  Laterality: N/A;  930  ? ESOPHAGOGASTRODUODENOSCOPY (EGD) WITH PROPOFOL N/A 11/24/2018  ? benign-appearing esophageal peptic stricture s/p dilation, small hiatal hernia, moderate erosive gastritis.   ? EYE SURGERY    ? gum flap Bilateral   ? HEMICOLECTOMY  RIGHT 2006  ? HERNIA REPAIR    ? LUMBAR DISC SURGERY    ? POLYPECTOMY   04/21/2018  ? Procedure: POLYPECTOMY;  Surgeon: Danie Binder, MD;  [facility];  Service: Endoscopy;;  (colon)  ? SAVORY DILATION N/A 06/30/2012  ? Procedure: SAVORY DILATION;  Surgeon: Danie Binder, MD;  [facility];  Service: Endoscopy;  Laterality: N/A;  12.56m , 162m 1580m? SAVORY DILATION N/A 02/20/2016  ? Procedure: SAVORY DILATION;  Surgeon: SanDanie BinderD;  [facility];  Service: Endoscopy;  Laterality: N/A;  ? SAVORY DILATION N/A 11/24/2018  ? Procedure: SAVORY DILATION;  Surgeon: FieDanie BinderD;  [facility];  Service: Endoscopy;  Laterality: N/A;  ? UPPER GASTROINTESTINAL ENDOSCOPY  JAN 2009 ABD PAIN  ? GASTRITIS, ESO RING  ? UPPER GASTROINTESTINAL ENDOSCOPY  OCT 2010 ABD PAIN, DYSPHAGIA  ? DIL 15MM, GASTRITIS, NL DUODENUM  ? UPPER GASTROINTESTINAL ENDOSCOPY  OCT 2010 DYSPHAGIA  ? DIL 17 MM, GASTRITIS, NL DUODENUM  ? vocal cord surgery  Sep 20, 2010  ? precancerous areas removed  ? wisdom tooth extraction Right   ? pin placement due to broken jaw  ? ?Patient Active Problem List  ? Diagnosis Date Noted  ? Rash 08/14/2021  ? Hypothyroidism 12/13/2020  ? S/P lumbar fusion 10/19/2019  ? Seasonal allergies 09/01/2018  ? Chronic low back pain 04/07/2018  ? Perianal cyst 02/24/2018  ? Alcohol abuse 12/23/2017  ? Tobacco abuse 06/25/2017  ? Osteoarthritis 12/27/2014  ? Allergic dermatitis eyelid 12/27/2014  ? Abnormal CT scan, chest 01/12/2014  ? Polycythemia 09/08/2012  ? Vertigo 01/22/2012  ? Neck pain 01/22/2012  ? Chronic pain syndrome 12/27/2011  ? Diarrhea, episodic 12/27/2011  ? Edema of larynx 04/22/2011  ? Dysphagia 03/14/2011  ? DEPRESSION 04/24/2010  ? Chronic bronchitis (HCC[place]untersville9/16/2011  ? Osteoporosis 11/11/2008  ? Spondylolisthesis, lumbar region 07/28/2008  ? 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/2007  ? DEGENERATIVE DISC DISEASE, CERVICAL SPINE 06/29/2007  ? Hyperlipidemia 02/03/2007  ? VARCIES, [place]/27/2008  ? Anxiety state  02/27/2006  ? Essential hypertension 02/27/2006  ? GERD 02/27/2006  ? Crohn's disease of small intestine with complication (HCCBrooks0/09/81/1914 ? ?REFERRING DIAG: Z98.1 (ICD-10-CM) - S/P lumbar fusion  ? ?THERAPY DIAG:  ?Radiculopathy, lumbar region ? ?Muscle weakness (generalized) ? ?PERTINENT HISTORY: prior back surgery, anxiety COPD, migranes, osteoporosis . hx of concussion  ? ?PRECAUTIONS: Z98.1 (ICD-10-CM) - S/P lumbar fusion  ? ? ?SUBJECTIVE: Patient late again for appt today.  States her back pain was so bad her spouse had to help her out of bed.  States she did some raking over the weekend. States her pain was upward of 10 until she took her gabapentin and  pain pill, it is now down after this. ? ?PAIN:  ?Are you having pain? Yes: NPRS scale: 1/10 ?Pain location: abdominal and Lt hip ?Pain description: sharp achey throbbing  ?Aggravating factors: unknown ?Relieving factors: nothing  ?   ?TODAYS TREATMENT: ?09/11/21: ?Standing RTB scap retraction 10X2 sets ?RTB Rows 10X 2 sets ?RTB extensions 10X 2 sets ?Seated Blue physioball stretches forward and each side 5X10" each ?Quadruped mad cat old horse stretch 10X ? Ardine Eng pose 3X30" ? Opposite UE/LE 5X each ? ?09/06/21: ?Standing RTB scap retraction 10X ?RTB Rows 10X ?RTB extensions 10X ?Seated Blue physioball stretches forward and each side 5X10" each ?Quadruped mad cat old horse stretch 10X ? Ardine Eng pose 3X30" ? ?   ?09/03/21 ?Supine: ?Head press 5" x 10 ?Shoulder press 5" x 10 ?Bridge x 10 ?Quadruped:  ? Mad cat/ old horse x 10 ?UE x 5" x 10 /LE x 5" x 10 ?Child's pose 3x 30" ? ? ?08/28/21: ?Standing: ? RTB scapular retraction ?  Rows ?  Shoulder extension 15x 5" ? wall arch with heel raise 10x 5" ? 3D hip excursion 10x ?Quadruped:  Mad cat/ old horse x 10 ? UE/LE 10x 5"  ?Supine: Bridge 10x 5 ? Piriformis stretch 2x30" painful Lt groin ? Thomas stretch Lt hip flexor 2x 30" ? ? ?  ?08/23/21: ?Quadruped:  Mad cat/ old horse x 10 ? UE/LE 10x 5" ?Child's pose 3x  30" ?Standing: wall arch with heel raise 10x 5" ? Wall squat 10x5" ? Forearm on counter hip extension x 10  ?Supine: Bridge 15x ? ? ? ?08/16/2021 ?Observation/Other Assessments   ?  Focus on Therapeutic Outcomes (FOTO)  3

## 2021-09-13 ENCOUNTER — Ambulatory Visit (HOSPITAL_COMMUNITY): Payer: Medicare Other | Admitting: Physical Therapy

## 2021-09-13 ENCOUNTER — Encounter (HOSPITAL_COMMUNITY): Payer: Self-pay | Admitting: Physical Therapy

## 2021-09-13 DIAGNOSIS — M6281 Muscle weakness (generalized): Secondary | ICD-10-CM | POA: Diagnosis not present

## 2021-09-13 DIAGNOSIS — M5416 Radiculopathy, lumbar region: Secondary | ICD-10-CM | POA: Diagnosis not present

## 2021-09-13 NOTE — Therapy (Signed)
?OUTPATIENT PHYSICAL THERAPY TREATMENT NOTE ? ? ?Patient Name: [name] [name] ?MRN: [id number] ?DOB:1955-06-11, 66 y.o., female ?Today's Date: 09/13/2021 ? ?PCP: Lindell Spar, MD ?REFERRING PROVIDER: Lindell Spar, MD ? ?END OF SESSION:  ? PT End of Session - 09/13/21 1005   ? ? Visit Number 13   ? Number of Visits 16   ? Date for PT Re-Evaluation 09/15/21   ? Authorization Type [facility] MEDICARE; Progress note completed visit #6   ? Progress Note Due on Visit 16   ? PT Start Time 1005   ? PT Stop Time 1045   ? PT Time Calculation (min) 40 min   ? Activity Tolerance Patient tolerated treatment well   ? Behavior During Therapy [facility] for tasks assessed/performed   ? ?  ?  ? ?  ? ? ? ? ? ?Past Medical History:  ?Diagnosis Date  ? Allergic rhinitis   ? Anastomotic ulcer JAN 2009  ? Anxiety   ? Arthritis   ? Asthma   ? BMI (body mass index) 20.0-29.9 2009 121 lbs  ? Concussion   ? 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 Crohn disease ([place])   ? CTS (carpal tunnel syndrome)   ? Diarrhea MULITIFACTORIAL  ? IBS, LACTOSE INTOLERANCE, SBBO, BILE-SALT  ? Elevated liver enzymes 2009: BMI 24 ?Etoh ALT 94, AST 51,  NEG ANA, qIGs& ASMA  ? MAY 2012 AST 52 ALT 38  ? Esophageal stricture 2009  ? GERD (gastroesophageal reflux disease)   ? Hemorrhoid   ? Hyperlipemia   ? Hypertension   ? Inflammatory bowel disease 2006 CD  ? SURGICAL REMISSION  ? LBP (low back pain)   ? Mental disorder   ? Migraine   ? Osteoporosis   ? PONV (postoperative nausea and vomiting)   ? Shortness of breath   ? exertion and humidity  ? Shoulder pain   ? right  ? Sleep apnea   ? Stop [employer] score of 4  ? ?Past Surgical History:  ?Procedure Laterality Date  ? BACK SURGERY    ? BACK SURGERY  07/16/2018  ? BILATERAL SALPINGOOPHORECTOMY    ? BIOPSY  12/24/2011  ? Procedure: BIOPSY;  Surgeon: Danie Binder, MD;  [facility];  Service: Endoscopy;;  Gastric Biopsies  ? BIOPSY N/A 06/30/2012  ? Procedure: BIOPSY;  Surgeon: Danie Binder, MD;  Location: [facility];   Service: Endoscopy;  Laterality: N/A;  Gastric and Esophageal Biopsies  ? CARPAL TUNNEL RELEASE  LEFT  ? CATARACT EXTRACTION W/PHACO Right 07/30/2016  ? Procedure: CATARACT EXTRACTION PHACO AND INTRAOCULAR LENS PLACEMENT (IOC);  Surgeon: Rutherford Guys, MD;  [facility]: [facility];  Service: Ophthalmology;  Laterality: Right;  CDE: 5.45  ? CATARACT EXTRACTION W/PHACO Left 08/13/2016  ? Procedure: CATARACT EXTRACTION PHACO AND INTRAOCULAR LENS PLACEMENT (IOC);  Surgeon: Rutherford Guys, MD;  [facility]: [facility];  Service: Ophthalmology;  Laterality: Left;  CDE: 5.59  ? COLON SURGERY    ? COLONOSCOPY  JAN 2009 DIARRHEA, ABD PAIN, BRBPR  ? ANASTOMOTIC ULCER(3MM), IH IW:OEHOZY ULCER, NL COLON bX  ? COLONOSCOPY WITH PROPOFOL N/A 04/21/2018  ? examined portion of the ileum normal, two 3-5 mm polyps, diverticulosis in the rectosigmoid and sigmoid colon, external and internal hemorrhoids, significant looping of the colon.  Pathology with tubular adenomas.  Recommendations to follow high-fiber diet and repeat colonoscopy in 5 to 10 years with peds colonoscope.  ? DILATION AND CURETTAGE OF UTERUS    ? ESOPHAGEAL  DILATION  12/24/2011  ? SLF: A stricture was found in the distal esophagus/ Moderate gastritis/ MILD Duodenitis  ? ESOPHAGOGASTRODUODENOSCOPY  02/07/09  ? mild gastritis  ? ESOPHAGOGASTRODUODENOSCOPY (EGD) WITH PROPOFOL N/A 06/30/2012  ? SLF: 1. No definite stricture appreciated 2. small hiatal hernia 3. Gastritis  ? ESOPHAGOGASTRODUODENOSCOPY (EGD) WITH PROPOFOL N/A 02/20/2016  ? Procedure: ESOPHAGOGASTRODUODENOSCOPY (EGD) WITH PROPOFOL;  Surgeon: Danie Binder, MD;  [facility];  Service: Endoscopy;  Laterality: N/A;  930  ? ESOPHAGOGASTRODUODENOSCOPY (EGD) WITH PROPOFOL N/A 11/24/2018  ? benign-appearing esophageal peptic stricture s/p dilation, small hiatal hernia, moderate erosive gastritis.   ? EYE SURGERY    ? gum flap Bilateral   ? HEMICOLECTOMY  RIGHT 2006  ? HERNIA REPAIR    ? LUMBAR DISC SURGERY    ? POLYPECTOMY   04/21/2018  ? Procedure: POLYPECTOMY;  Surgeon: Danie Binder, MD;  [facility];  Service: Endoscopy;;  (colon)  ? SAVORY DILATION N/A 06/30/2012  ? Procedure: SAVORY DILATION;  Surgeon: Danie Binder, MD;  [facility];  Service: Endoscopy;  Laterality: N/A;  12.8m , 159m 154m? SAVORY DILATION N/A 02/20/2016  ? Procedure: SAVORY DILATION;  Surgeon: SanDanie BinderD;  [facility];  Service: Endoscopy;  Laterality: N/A;  ? SAVORY DILATION N/A 11/24/2018  ? Procedure: SAVORY DILATION;  Surgeon: FieDanie BinderD;  [facility];  Service: Endoscopy;  Laterality: N/A;  ? UPPER GASTROINTESTINAL ENDOSCOPY  JAN 2009 ABD PAIN  ? GASTRITIS, ESO RING  ? UPPER GASTROINTESTINAL ENDOSCOPY  OCT 2010 ABD PAIN, DYSPHAGIA  ? DIL 15MM, GASTRITIS, NL DUODENUM  ? UPPER GASTROINTESTINAL ENDOSCOPY  OCT 2010 DYSPHAGIA  ? DIL 17 MM, GASTRITIS, NL DUODENUM  ? vocal cord surgery  Sep 20, 2010  ? precancerous areas removed  ? wisdom tooth extraction Right   ? pin placement due to broken jaw  ? ?Patient Active Problem List  ? Diagnosis Date Noted  ? Rash 08/14/2021  ? Hypothyroidism 12/13/2020  ? S/P lumbar fusion 10/19/2019  ? Seasonal allergies 09/01/2018  ? Chronic low back pain 04/07/2018  ? Perianal cyst 02/24/2018  ? Alcohol abuse 12/23/2017  ? Tobacco abuse 06/25/2017  ? Osteoarthritis 12/27/2014  ? Allergic dermatitis eyelid 12/27/2014  ? Abnormal CT scan, chest 01/12/2014  ? Polycythemia 09/08/2012  ? Vertigo 01/22/2012  ? Neck pain 01/22/2012  ? Chronic pain syndrome 12/27/2011  ? Diarrhea, episodic 12/27/2011  ? Edema of larynx 04/22/2011  ? Dysphagia 03/14/2011  ? DEPRESSION 04/24/2010  ? Chronic bronchitis ([place]CC[place]9/16/2011  ? Osteoporosis 11/11/2008  ? Spondylolisthesis, lumbar region 07/28/2008  ? 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/2007  ? DEGENERATIVE DISC DISEASE, CERVICAL SPINE 06/29/2007  ? Hyperlipidemia 02/03/2007  ? [place]  ? Anxiety state  02/27/2006  ? Essential hypertension 02/27/2006  ? GERD 02/27/2006  ? Crohn's disease of small intestine with complication ([place]CC[place]inesdale0/59/93/5701 ? ?REFERRING DIAG: Z98.1 (ICD-10-CM) - S/P lumbar fusion  ? ?THERAPY DIAG:  ?Radiculopathy, lumbar region ? ?Muscle weakness (generalized) ? ?PERTINENT HISTORY: prior back surgery, anxiety COPD, migranes, osteoporosis . hx of concussion  ? ?PRECAUTIONS: Z98.1 (ICD-10-CM) - S/P lumbar fusion  ? ? ?SUBJECTIVE: Pt states her pain this morning was at a 7/10.  When she got up and started moving around as well as taking her pain meds her pain decreased to a 2/10  ?PAIN:  ?Are you having pain? Yes: NPRS scale: 2/10 ?Pain location:  abdominal and Lt hip ?Pain description: sharp achey throbbing  ?Aggravating factors: unknown ?Relieving factors: nothing  ?   ?TODAYS TREATMENT: ?                       09/12/21 ? ?Standing RTB scap retraction 15 green theraband for HEP  ?RTB Rows 15 green theraband for HEP  ?RTB extensions 15 reps green theraband for HEP  ?Paloff x 10 B green t-band ?Wall arch x 10  ?Leg press 2 pl x 10  ?Supine:  knee to chest , hamstring stretch with flossing 3 x 30" B  ?Quadruped mad cat old horse stretch 10X  Opposite UE/LE 5X each ? ?09/11/21: ?Standing RTB scap retraction 10X2 sets ?RTB Rows 10X 2 sets ?RTB extensions 10X 2 sets ?Seated Blue physioball stretches forward and each side 5X10" each ?Quadruped mad cat old horse stretch 10X ? Ardine Eng pose 3X30" ? Opposite UE/LE 5X each ? ?09/06/21: ?Standing RTB scap retraction 10X ?RTB Rows 10X ?RTB extensions 10X ?Seated Blue physioball stretches forward and each side 5X10" each ?Quadruped mad cat old horse stretch 10X ? Ardine Eng pose 3X30" ? ?   ?09/03/21 ?Supine: ?Head press 5" x 10 ?Shoulder press 5" x 10 ?Bridge x 10 ?Quadruped:  ? Mad cat/ old horse x 10 ?UE x 5" x 10 /LE x 5" x 10 ?Child's pose 3x 30" ? ? ?08/28/21: ?Standing: ? RTB scapular retraction ?  Rows ?  Shoulder extension 15x 5" ? wall arch with heel raise 10x  5" ? 3D hip excursion 10x ?Quadruped:  Mad cat/ old horse x 10 ? UE/LE 10x 5"  ?Supine: Bridge 10x 5 ? Piriformis stretch 2x30" painful Lt groin ? Thomas stretch Lt hip flexor 2x 30" ? ? ?  ?08/23/21: ?Quadruped:  M

## 2021-09-18 ENCOUNTER — Ambulatory Visit (HOSPITAL_COMMUNITY): Payer: Medicare Other | Admitting: Physical Therapy

## 2021-09-18 DIAGNOSIS — M5416 Radiculopathy, lumbar region: Secondary | ICD-10-CM

## 2021-09-18 DIAGNOSIS — M6281 Muscle weakness (generalized): Secondary | ICD-10-CM | POA: Diagnosis not present

## 2021-09-18 NOTE — Therapy (Addendum)
?OUTPATIENT PHYSICAL THERAPY TREATMENT NOTE ?Discharge ? ? ?Patient Name: [name] [name] ?MRN: [id number] ?DOB:[birth year], 66 y.o., female ?Today's Date: 09/18/2021 ? ?PCP: Lindell Spar, MD ?REFERRING PROVIDER: Lindell Spar, MD ?PHYSICAL THERAPY DISCHARGE SUMMARY ? ?Visits from Start of Care: 14 ? ?Current functional level related to goals / functional outcomes: ?I in advanced HEP ?  ?Remaining deficits: ?PT vocalizes pain but is carrying on a fluent conversation and moving fluidly . ?  ?Education / Equipment: ?HEP  ? ?Patient agrees to discharge. Patient goals were partially met. Patient is being discharged due to maximized rehab potential.   ?END OF SESSION:  ? PT End of Session - 09/18/21 0383   ? ? Visit Number 14   ? Number of Visits 16   ? Date for PT Re-Evaluation 09/15/21   ? Authorization Type [facility] MEDICARE; Progress note completed visit #6   ? Progress Note Due on Visit 16   ? PT Start Time [phone]   ? PT Stop Time 1006   ? PT Time Calculation (min) 48 min   ? Activity Tolerance Patient tolerated treatment well   ? Behavior During Therapy [facility] for tasks assessed/performed   ? ?  ?  ? ?  ? ? ? ? ? ?Past Medical History:  ?Diagnosis Date  ? Allergic rhinitis   ? Anastomotic ulcer JAN 2009  ? Anxiety   ? Arthritis   ? Asthma   ? BMI (body mass index) 20.0-29.9 2009 121 lbs  ? Concussion   ? 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 Crohn disease ([place])   ? CTS (carpal tunnel syndrome)   ? Diarrhea MULITIFACTORIAL  ? IBS, LACTOSE INTOLERANCE, SBBO, BILE-SALT  ? Elevated liver enzymes 2009: BMI 24 ?Etoh ALT 94, AST 51,  NEG ANA, qIGs& ASMA  ? MAY 2012 AST 52 ALT 38  ? Esophageal stricture 2009  ? GERD (gastroesophageal reflux disease)   ? Hemorrhoid   ? Hyperlipemia   ? Hypertension   ? Inflammatory bowel disease 2006 CD  ? SURGICAL REMISSION  ? LBP (low back pain)   ? Mental disorder   ? Migraine   ? Osteoporosis   ? PONV (postoperative nausea and vomiting)   ? Shortness of breath   ? exertion and humidity  ?  Shoulder pain   ? right  ? Sleep apnea   ? Stop [employer] score of 4  ? ?Past Surgical History:  ?Procedure Laterality Date  ? BACK SURGERY    ? BACK SURGERY  07/16/2018  ? BILATERAL SALPINGOOPHORECTOMY    ? BIOPSY  12/24/2011  ? Procedure: BIOPSY;  Surgeon: Danie Binder, MD;  [facility];  Service: Endoscopy;;  Gastric Biopsies  ? BIOPSY N/A 06/30/2012  ? Procedure: BIOPSY;  Surgeon: Danie Binder, MD;  [facility];  Service: Endoscopy;  Laterality: N/A;  Gastric and Esophageal Biopsies  ? CARPAL TUNNEL RELEASE  LEFT  ? CATARACT EXTRACTION W/PHACO Right 07/30/2016  ? Procedure: CATARACT EXTRACTION PHACO AND INTRAOCULAR LENS PLACEMENT (IOC);  Surgeon: Rutherford Guys, MD;  Location: [facility];  Service: Ophthalmology;  Laterality: Right;  CDE: 5.45  ? CATARACT EXTRACTION W/PHACO Left 08/13/2016  ? Procedure: CATARACT EXTRACTION PHACO AND INTRAOCULAR LENS PLACEMENT (IOC);  Surgeon: Rutherford Guys, MD;  [facility];  Service: Ophthalmology;  Laterality: Left;  CDE: 5.59  ? COLON SURGERY    ? COLONOSCOPY  JAN 2009 DIARRHEA, ABD PAIN, BRBPR  ? ANASTOMOTIC ULCER(3MM), IH AN:VBTYOM ULCER, NL  COLON bX  ? COLONOSCOPY WITH PROPOFOL N/A 04/21/2018  ? examined portion of the ileum normal, two 3-5 mm polyps, diverticulosis in the rectosigmoid and sigmoid colon, external and internal hemorrhoids, significant looping of the colon.  Pathology with tubular adenomas.  Recommendations to follow high-fiber diet and repeat colonoscopy in 5 to 10 years with peds colonoscope.  ? DILATION AND CURETTAGE OF UTERUS    ? ESOPHAGEAL DILATION  12/24/2011  ? SLF: A stricture was found in the distal esophagus/ Moderate gastritis/ MILD Duodenitis  ? ESOPHAGOGASTRODUODENOSCOPY  02/07/09  ? mild gastritis  ? ESOPHAGOGASTRODUODENOSCOPY (EGD) WITH PROPOFOL N/A 06/30/2012  ? SLF: 1. No definite stricture appreciated 2. small hiatal hernia 3. Gastritis  ? ESOPHAGOGASTRODUODENOSCOPY (EGD) WITH PROPOFOL N/A 02/20/2016  ? Procedure:  ESOPHAGOGASTRODUODENOSCOPY (EGD) WITH PROPOFOL;  Surgeon: Danie Binder, MD;  [facility];  Service: Endoscopy;  Laterality: N/A;  930  ? ESOPHAGOGASTRODUODENOSCOPY (EGD) WITH PROPOFOL N/A 11/24/2018  ? benign-appearing esophageal peptic stricture s/p dilation, small hiatal hernia, moderate erosive gastritis.   ? EYE SURGERY    ? gum flap Bilateral   ? HEMICOLECTOMY  RIGHT 2006  ? HERNIA REPAIR    ? LUMBAR DISC SURGERY    ? POLYPECTOMY  04/21/2018  ? Procedure: POLYPECTOMY;  Surgeon: Danie Binder, MD;  [facility];  Service: Endoscopy;;  (colon)  ? SAVORY DILATION N/A 06/30/2012  ? Procedure: SAVORY DILATION;  Surgeon: Danie Binder, MD;  [facility];  Service: Endoscopy;  Laterality: N/A;  12.68m , 123m 1527m? SAVORY DILATION N/A 02/20/2016  ? Procedure: SAVORY DILATION;  Surgeon: SanDanie BinderD;  [facility];  Service: Endoscopy;  Laterality: N/A;  ? SAVORY DILATION N/A 11/24/2018  ? Procedure: SAVORY DILATION;  Surgeon: FieDanie BinderD;  [facility];  Service: Endoscopy;  Laterality: N/A;  ? UPPER GASTROINTESTINAL ENDOSCOPY  JAN 2009 ABD PAIN  ? GASTRITIS, ESO RING  ? UPPER GASTROINTESTINAL ENDOSCOPY  OCT 2010 ABD PAIN, DYSPHAGIA  ? DIL 15MM, GASTRITIS, NL DUODENUM  ? UPPER GASTROINTESTINAL ENDOSCOPY  OCT 2010 DYSPHAGIA  ? DIL 17 MM, GASTRITIS, NL DUODENUM  ? vocal cord surgery  Sep 20, 2010  ? precancerous areas removed  ? wisdom tooth extraction Right   ? pin placement due to broken jaw  ? ?Patient Active Problem List  ? Diagnosis Date Noted  ? Rash 08/14/2021  ? Hypothyroidism 12/13/2020  ? S/P lumbar fusion 10/19/2019  ? Seasonal allergies 09/01/2018  ? Chronic low back pain 04/07/2018  ? Perianal cyst 02/24/2018  ? Alcohol abuse 12/23/2017  ? Tobacco abuse 06/25/2017  ? Osteoarthritis 12/27/2014  ? Allergic dermatitis eyelid 12/27/2014  ? Abnormal CT scan, chest 01/12/2014  ? Polycythemia 09/08/2012  ? Vertigo 01/22/2012  ? Neck pain 01/22/2012   ? Chronic pain syndrome 12/27/2011  ? Diarrhea, episodic 12/27/2011  ? Edema of larynx 04/22/2011  ? Dysphagia 03/14/2011  ? DEPRESSION 04/24/2010  ? Chronic bronchitis (HCCLos Lunas9/16/2011  ? Osteoporosis 11/11/2008  ? Spondylolisthesis, lumbar region 07/28/2008  ? 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/2007  ? DEGENERATIVE DISC DISEASE, CERVICAL SPINE 06/29/2007  ? Hyperlipidemia 02/03/2007  ? VARCIES[place]  ? Anxiety state 02/27/2006  ? Essential hypertension 02/27/2006  ? GERD 02/27/2006  ? Crohn's disease of small intestine with complication (HCCYale0/63/89/3734 ? ?REFERRING DIAG: Z98.1 (ICD-10-CM) - S/P lumbar fusion  ? ?THERAPY DIAG:  ?Radiculopathy, lumbar region ? ?Muscle weakness (generalized) ? ?  PERTINENT HISTORY: prior back surgery, anxiety COPD, migranes, osteoporosis . hx of concussion  ? ?PRECAUTIONS: Z98.1 (ICD-10-CM) - S/P lumbar fusion  ? ? ?SUBJECTIVE: Pt states she is improved approximately 100% in mobility but less than 50% in strength.  States she needs to continue her exercises and thinks it will continue to improve. her pain this morning was at a 7/10.  When she got up and started moving around as well as taking her pain meds her pain decreased to a 2/10  ? ?PAIN:  ?Are you having pain? Yes: NPRS scale: 2/10 ?Pain location: abdominal and Lt hip ?Pain description: sharp achey throbbing  ?Aggravating factors: unknown ?Relieving factors: nothing  ?   ?TODAYS TREATMENT: ?                       09/18/21 ?  Reassessment ?  MMT/ROM, review of goals,HEP review,  Discharge instructions ? ?09/12/21 ?Standing RTB scap retraction 15 green theraband for HEP  ?RTB Rows 15 green theraband for HEP  ?RTB extensions 15 reps green theraband for HEP  ?Paloff x 10 B green t-band ?Wall arch x 10  ?Leg press 2 pl x 10  ?Supine:  knee to chest , hamstring stretch with flossing 3 x 30" B  ?Quadruped mad cat old horse stretch 10X   ?Opposite UE/LE 5X each ? ?09/11/21: ?Standing RTB scap retraction 10X2  sets ?RTB Rows 10X 2 sets ?RTB extensions 10X 2 sets ?Seated Blue physioball stretches forward and each side 5X10" each ?Quadruped mad cat old horse stretch 10X ? Ardine Eng pose 3X30" ? Opposite UE/LE 5X each ? ?09/06/21: ?Standing

## 2021-09-20 ENCOUNTER — Encounter (HOSPITAL_COMMUNITY): Payer: Medicare Other | Admitting: Physical Therapy

## 2021-09-24 ENCOUNTER — Other Ambulatory Visit: Payer: Self-pay | Admitting: Internal Medicine

## 2021-09-24 DIAGNOSIS — B370 Candidal stomatitis: Secondary | ICD-10-CM

## 2021-09-24 DIAGNOSIS — J302 Other seasonal allergic rhinitis: Secondary | ICD-10-CM

## 2021-09-25 ENCOUNTER — Encounter (HOSPITAL_COMMUNITY): Payer: Medicare Other | Admitting: Physical Therapy

## 2021-09-25 DIAGNOSIS — M5442 Lumbago with sciatica, left side: Secondary | ICD-10-CM | POA: Diagnosis not present

## 2021-09-25 DIAGNOSIS — M545 Low back pain, unspecified: Secondary | ICD-10-CM | POA: Diagnosis not present

## 2021-09-25 DIAGNOSIS — Z79899 Other long term (current) drug therapy: Secondary | ICD-10-CM | POA: Diagnosis not present

## 2021-09-25 DIAGNOSIS — M4326 Fusion of spine, lumbar region: Secondary | ICD-10-CM | POA: Diagnosis not present

## 2021-09-25 DIAGNOSIS — G894 Chronic pain syndrome: Secondary | ICD-10-CM | POA: Diagnosis not present

## 2021-10-02 DIAGNOSIS — Z79899 Other long term (current) drug therapy: Secondary | ICD-10-CM | POA: Diagnosis not present

## 2021-10-02 DIAGNOSIS — Z79891 Long term (current) use of opiate analgesic: Secondary | ICD-10-CM | POA: Diagnosis not present

## 2021-10-23 ENCOUNTER — Encounter: Payer: Medicare Other | Admitting: Internal Medicine

## 2021-10-23 DIAGNOSIS — M4326 Fusion of spine, lumbar region: Secondary | ICD-10-CM | POA: Diagnosis not present

## 2021-10-23 DIAGNOSIS — G894 Chronic pain syndrome: Secondary | ICD-10-CM | POA: Diagnosis not present

## 2021-10-23 DIAGNOSIS — M5442 Lumbago with sciatica, left side: Secondary | ICD-10-CM | POA: Diagnosis not present

## 2021-10-23 DIAGNOSIS — M545 Low back pain, unspecified: Secondary | ICD-10-CM | POA: Diagnosis not present

## 2021-10-23 DIAGNOSIS — Z79899 Other long term (current) drug therapy: Secondary | ICD-10-CM | POA: Diagnosis not present

## 2021-10-31 ENCOUNTER — Encounter: Payer: Medicare Other | Admitting: Family Medicine

## 2021-10-31 ENCOUNTER — Encounter: Payer: Medicare Other | Admitting: Internal Medicine

## 2021-11-07 ENCOUNTER — Other Ambulatory Visit: Payer: Self-pay | Admitting: Internal Medicine

## 2021-11-07 DIAGNOSIS — I10 Essential (primary) hypertension: Secondary | ICD-10-CM

## 2021-11-07 DIAGNOSIS — J302 Other seasonal allergic rhinitis: Secondary | ICD-10-CM

## 2021-11-13 DIAGNOSIS — M4326 Fusion of spine, lumbar region: Secondary | ICD-10-CM | POA: Diagnosis not present

## 2021-11-13 DIAGNOSIS — M5442 Lumbago with sciatica, left side: Secondary | ICD-10-CM | POA: Diagnosis not present

## 2021-11-13 DIAGNOSIS — G894 Chronic pain syndrome: Secondary | ICD-10-CM | POA: Diagnosis not present

## 2021-11-13 DIAGNOSIS — M545 Low back pain, unspecified: Secondary | ICD-10-CM | POA: Diagnosis not present

## 2021-11-13 DIAGNOSIS — Z79899 Other long term (current) drug therapy: Secondary | ICD-10-CM | POA: Diagnosis not present

## 2021-11-14 ENCOUNTER — Encounter: Payer: Medicare Other | Admitting: Family Medicine

## 2021-11-17 ENCOUNTER — Other Ambulatory Visit: Payer: Self-pay | Admitting: Internal Medicine

## 2021-12-07 ENCOUNTER — Encounter: Payer: Self-pay | Admitting: Internal Medicine

## 2021-12-07 ENCOUNTER — Other Ambulatory Visit: Payer: Self-pay

## 2021-12-07 ENCOUNTER — Ambulatory Visit (HOSPITAL_COMMUNITY)
Admission: RE | Admit: 2021-12-07 | Discharge: 2021-12-07 | Disposition: A | Discharge status: Home / Self Care | Payer: Medicare Other | Source: Ambulatory Visit | Attending: Internal Medicine | Admitting: Internal Medicine

## 2021-12-07 ENCOUNTER — Ambulatory Visit (INDEPENDENT_AMBULATORY_CARE_PROVIDER_SITE_OTHER): Payer: Medicare Other | Admitting: Internal Medicine

## 2021-12-07 VITALS — BP 124/74 | HR 78 | Ht 60.0 in | Wt 108.6 lb

## 2021-12-07 DIAGNOSIS — M79604 Pain in right leg: Secondary | ICD-10-CM | POA: Diagnosis not present

## 2021-12-07 DIAGNOSIS — Z7409 Other reduced mobility: Secondary | ICD-10-CM | POA: Diagnosis not present

## 2021-12-07 NOTE — Progress Notes (Signed)
Acute Office Visit  Subjective:    Patient ID: [name] [name], female    DOB: Sep 24, 1955, 66 y.o.   MRN: [id number]  Chief Complaint  Patient presents with   Leg Pain    Right leg pain for about a while but seems to be getting worse    HPI Patient is in today for complaint of right leg pain and swelling for the last 3 days.  She recently went to [place], [place] and drove for 2 days.  She started having right leg pain and swelling while she was there.  She has chronic numbness of the LE due to peripheral neuropathy.  She has tried taking her Percocet for pain, but it has not been helping much.  She denies any chest pain, dyspnea or palpitations.  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   Crohn disease ([place])    CTS (carpal tunnel syndrome)    Diarrhea MULITIFACTORIAL   IBS, LACTOSE INTOLERANCE, SBBO, BILE-SALT   Elevated liver enzymes 2009: BMI 24 ?Etoh ALT 94, AST 51,  NEG ANA, qIGs& ASMA   MAY 2012 AST 52 ALT 38   Esophageal stricture 2009   GERD (gastroesophageal reflux disease)    Hemorrhoid    Hyperlipemia    Hypertension    Inflammatory bowel disease 2006 CD   SURGICAL REMISSION   LBP (low back pain)    Mental disorder    Migraine    Osteoporosis    PONV (postoperative nausea and vomiting)    Shortness of breath    exertion and humidity   Shoulder pain    right   Sleep apnea    Stop Bang Cock score of 4    Past Surgical History:  Procedure Laterality Date   BACK SURGERY     BACK SURGERY  07/16/2018   BILATERAL SALPINGOOPHORECTOMY     BIOPSY  12/24/2011   Procedure: BIOPSY;  Surgeon: Danie Binder, MD;  Location: [facility];  Service: Endoscopy;;  Gastric Biopsies   BIOPSY N/A 06/30/2012   Procedure: BIOPSY;  Surgeon: Danie Binder, MD;  Location: [facility];  Service: Endoscopy;   Laterality: N/A;  Gastric and Esophageal Biopsies   CARPAL TUNNEL RELEASE  LEFT   CATARACT EXTRACTION W/PHACO Right 07/30/2016   Procedure: CATARACT EXTRACTION PHACO AND INTRAOCULAR LENS PLACEMENT ([place]);  Surgeon: Rutherford Guys, MD;  Location: [facility];  Service: Ophthalmology;  Laterality: Right;  CDE: 5.45   CATARACT EXTRACTION W/PHACO Left 08/13/2016   Procedure: CATARACT EXTRACTION PHACO AND INTRAOCULAR LENS PLACEMENT ([facility]);  Surgeon: Rutherford Guys, MD;  Location: [facility];  Service: Ophthalmology;  Laterality: Left;  CDE: 5.59   COLON SURGERY     COLONOSCOPY  JAN 2009 DIARRHEA, ABD PAIN, BRBPR   ANASTOMOTIC ULCER(3MM), IH WU:XLKGMW ULCER, NL COLON bX   COLONOSCOPY WITH PROPOFOL N/A 04/21/2018   examined portion of the ileum normal, two 3-5 mm polyps, diverticulosis in the rectosigmoid and sigmoid colon, external and internal hemorrhoids, significant looping of the colon.  Pathology with tubular adenomas.  Recommendations to follow high-fiber diet and repeat colonoscopy in 5 to 10 years with peds colonoscope.   DILATION AND CURETTAGE OF UTERUS     ESOPHAGEAL DILATION  12/24/2011   SLF: A stricture  was found in the distal esophagus/ Moderate gastritis/ MILD Duodenitis   ESOPHAGOGASTRODUODENOSCOPY  02/07/09   mild gastritis   ESOPHAGOGASTRODUODENOSCOPY (EGD) WITH PROPOFOL N/A 06/30/2012   SLF: 1. No definite stricture appreciated 2. small hiatal hernia 3. Gastritis   ESOPHAGOGASTRODUODENOSCOPY (EGD) WITH PROPOFOL N/A 02/20/2016   Procedure: ESOPHAGOGASTRODUODENOSCOPY (EGD) WITH PROPOFOL;  Surgeon: Danie Binder, MD;  [facility];  Service: Endoscopy;  Laterality: N/A;  930   ESOPHAGOGASTRODUODENOSCOPY (EGD) WITH PROPOFOL N/A 11/24/2018   benign-appearing esophageal peptic stricture s/p dilation, small hiatal hernia, moderate erosive gastritis.    EYE SURGERY     gum flap Bilateral    HEMICOLECTOMY  RIGHT 2006   HERNIA REPAIR     LUMBAR DISC SURGERY     POLYPECTOMY  04/21/2018    Procedure: POLYPECTOMY;  Surgeon: Danie Binder, MD;  [facility];  Service: Endoscopy;;  (colon)   SAVORY DILATION N/A 06/30/2012   Procedure: SAVORY DILATION;  Surgeon: Danie Binder, MD;  Location: [facility];  Service: Endoscopy;  Laterality: N/A;  12.83m , 154m 1570m SAVORY DILATION N/A 02/20/2016   Procedure: SAVORY DILATION;  Surgeon: SanDanie BinderD;  [facility];  Service: Endoscopy;  Laterality: N/A;   SAVORY DILATION N/A 11/24/2018   Procedure: SAVORY DILATION;  Surgeon: FieDanie BinderD;  [facility];  Service: Endoscopy;  Laterality: N/A;   UPPER GASTROINTESTINAL ENDOSCOPY  JAN 2009 ABD PAIN   GASTRITIS, ESO RING   UPPER GASTROINTESTINAL ENDOSCOPY  OCT 2010 ABD PAIN, DYSPHAGIA   DIL 15MM, GASTRITIS, NL DUODENUM   UPPER GASTROINTESTINAL ENDOSCOPY  OCT 2010 DYSPHAGIA   DIL 17 MM, GASTRITIS, NL DUODENUM   vocal cord surgery  Sep 20, 2010   precancerous areas removed   wisdom tooth extraction Right    pin placement due to broken jaw    Family History  Problem Relation Age of Onset   Hypothyroidism Mother    Heart disease Father    Parkinson's disease Father    Hypothyroidism Daughter    Heart attack Paternal Grandfather    Alzheimer's disease Paternal Grandmother    Heart attack Maternal Grandfather    Crohn's disease Sister    Other Sister        was murdered   Crohn's disease Maternal Uncle    Colon cancer Neg Hx    Colon polyps Neg Hx     Social History   Socioeconomic History   Marital status: Single    Spouse name: WayPatrick JupiterNumber of children: 2   Years of education: GED   Highest education level: Not on file  Occupational History    Comment: disabled  Tobacco Use   Smoking status: Every Day    Packs/day: 0.25    Years: 30.00    Total pack years: 7.50    Types: Cigarettes   Smokeless tobacco: Never  Vaping Use   Vaping Use: Never used  Substance and Sexual Activity   Alcohol use: Not Currently    Comment:  wine/beer a few times a week    Drug use: No   Sexual activity: Not Currently    Birth control/protection: Post-menopausal  Other Topics Concern   Not on file  Social History Narrative   Lives with husband   no caffiene   Social Determinants of Health   Financial Resource Strain: Low Risk  (01/13/2021)   Overall Financial Resource Strain (CARDIA)    Difficulty of Paying Living  Expenses: Not hard at all  Food Insecurity: No Food Insecurity (01/13/2021)   Hunger Vital Sign    Worried About Running Out of Food in the Last Year: Never true    Ran Out of Food in the Last Year: Never true  Transportation Needs: No Transportation Needs (01/13/2021)   PRAPARE - Hydrologist (Medical): No    Lack of Transportation (Non-Medical): No  Physical Activity: Insufficiently Active (01/13/2021)   Exercise Vital Sign    Days of Exercise per Week: 3 days    Minutes of Exercise per Session: 30 min  Stress: No Stress Concern Present (01/13/2021)   [place]    Feeling of Stress : Not at all  Social Connections: Moderately Integrated (01/13/2021)   Social Connection and Isolation Panel [NHANES]    Frequency of Communication with Friends and Family: More than three times a week    Frequency of Social Gatherings with Friends and Family: More than three times a week    Attends Religious Services: More than 4 times per year    Active Member of [employer] or Organizations: No    Attends Archivist Meetings: Never    Marital Status: Married  Human resources officer Violence: Not At Risk (01/13/2021)   Humiliation, Afraid, Rape, and Kick questionnaire    Fear of Current or Ex-Partner: No    Emotionally Abused: No    Physically Abused: No    Sexually Abused: No    Outpatient Medications Prior to Visit  Medication Sig Dispense Refill   albuterol (VENTOLIN HFA) 108 (90 Base) MCG/ACT inhaler Inhale 2 puffs into the  lungs every 6 (six) hours as needed for wheezing or shortness of breath. 18 g 11   amLODipine (NORVASC) 10 MG tablet Take 1 tablet by mouth once daily 90 tablet 0   benzonatate (TESSALON) 100 MG capsule TAKE 1 CAPSULE BY MOUTH TWICE DAILY AS NEEDED FOR COUGH 30 capsule 0   calcium carbonate (TUMS - DOSED IN MG ELEMENTAL CALCIUM) 500 MG chewable tablet Chew 1 tablet (200 mg of elemental calcium total) by mouth 3 (three) times daily with meals. 90 tablet 6   clotrimazole (CLOTRIMAZOLE AF) 1 % cream Apply 1 application topically 2 (two) times daily. To affected area for 1-2 weeks 30 g 0   cyclobenzaprine (FLEXERIL) 10 MG tablet Take 10 mg by mouth as needed for muscle spasms.      dexlansoprazole (DEXILANT) 60 MG capsule 1 PO EVERY MORNING WITH BREAKFAST. 90 capsule 3   fluticasone (FLONASE) 50 MCG/ACT nasal spray USE TWO SPRAY(S) IN EACH NOSTRIL ONCE DAILY AS NEEDED FOR ALLERGY OR RHINITIS 16 g 2   gabapentin (NEURONTIN) 400 MG capsule Take 1 capsule by mouth 4 times daily 90 capsule 0   lactase (LACTAID) 3000 units tablet Take 3,000 Units by mouth daily as needed (lactose intolerance).     levothyroxine (SYNTHROID) 25 MCG tablet Take 1 tablet by mouth once daily 30 tablet 2   Lidocaine HCl 4 % LIQD Apply 1 application topically 3 (three) times daily as needed (pain).     loratadine (CLARITIN) 10 MG tablet Take 1 tablet (10 mg total) by mouth daily. 30 tablet 3   LORazepam (ATIVAN) 0.5 MG tablet Take 0.5 mg by mouth at bedtime as needed.     magnesium hydroxide (MILK OF MAGNESIA) 400 MG/5ML suspension Take 30 mLs by mouth daily as needed for mild constipation.     Menthol  10 % AERO Apply 1 application topically 3 (three) times daily as needed (pain).     nystatin (MYCOSTATIN) 100000 UNIT/ML suspension TAKE 5 ML BY MOUTH  4 TIMES DAILY AS NEEDED FOR THRUSH 500 mL 0   oxyCODONE-acetaminophen (PERCOCET) 10-325 MG tablet Take 1 tablet by mouth 3 (three) times daily as needed.     SYMBICORT 80-4.5 MCG/ACT  inhaler Inhale 2 puffs by mouth twice daily 33 g 0   triamcinolone (KENALOG) 0.1 % Apply 1 application topically 2 (two) times daily. 30 g 0   UNABLE TO FIND Capsule to rebuild cells-BID     UNABLE TO FIND as needed. Penetrex cream     valACYclovir (VALTREX) 1000 MG tablet Take 1 tablet (1,000 mg total) by mouth 2 (two) times daily. 14 tablet 0   cholestyramine (QUESTRAN) 4 GM/DOSE powder DISSOLVE AND TAKE 2 GRAMS BY MOUTH TWICE DAILY WITH A MEAL. DO NOT TAKE WITHIN 2 HOURS OF OTHER MEDICATIONS. 378 g 0   diazepam (VALIUM) 5 MG tablet Take 5 mg by mouth every 12 (twelve) hours as needed for anxiety.     oxyCODONE-acetaminophen (PERCOCET/ROXICET) 5-325 MG tablet Take 1 tablet by mouth every 12 (twelve) hours as needed for severe pain. 60 tablet 0   No facility-administered medications prior to visit.    Allergies  Allergen Reactions   Naproxen Shortness Of Breath    Chest pain   Dicyclomine Hcl Other (See Comments)    Vision changes   Duloxetine Other (See Comments)    Suicidal    Eggs Or Egg-Derived Products Diarrhea    abd pain   Lovastatin Other (See Comments)    Chest pain   Toradol [Ketorolac Tromethamine] Other (See Comments)    Headache. Non-effective   Tramadol Other (See Comments)    Headache. Non-effective.   Nexium [Esomeprazole Magnesium] Diarrhea    Abdominal pain    Review of Systems  Constitutional:  Negative for chills and fever.  HENT:  Negative for congestion, sinus pressure, sinus pain and sore throat.   Eyes:  Negative for pain and discharge.  Respiratory:  Negative for cough and shortness of breath.   Cardiovascular:  Negative for chest pain and palpitations.  Gastrointestinal:  Positive for abdominal pain (chronic). Negative for constipation, diarrhea, nausea and vomiting.  Endocrine: Negative for polydipsia and polyuria.  Genitourinary:  Negative for dysuria and hematuria.  Musculoskeletal:  Positive for arthralgias, back pain and neck pain. Negative for  neck stiffness.       Right leg pain  Skin:  Negative for rash.  Neurological:  Positive for numbness. Negative for dizziness and weakness.  Psychiatric/Behavioral:  Negative for agitation and behavioral problems. The patient is nervous/anxious.        Objective:    Physical Exam Vitals reviewed.  Constitutional:      General: She is not in acute distress.    Appearance: She is not diaphoretic.  HENT:     Head: Normocephalic and atraumatic.     Nose: Nose normal.     Mouth/Throat:     Mouth: Mucous membranes are moist.  Eyes:     General: No scleral icterus.    Extraocular Movements: Extraocular movements intact.  Cardiovascular:     Rate and Rhythm: Normal rate and regular rhythm.     Heart sounds: Normal heart sounds. No murmur heard.    Comments: DPA pulse intact - b/l Pulmonary:     Breath sounds: Normal breath sounds. No wheezing or rales.  Abdominal:  Palpations: Abdomen is soft.     Tenderness: There is abdominal tenderness (Mild, generalized).  Musculoskeletal:        General: Tenderness (Lumbar spine area) present.     Cervical back: Neck supple. No tenderness.     Right lower leg: Edema (Mild, with warmth) present.     Left lower leg: No edema.  Skin:    General: Skin is warm.  Neurological:     General: No focal deficit present.     Mental Status: She is alert and oriented to person, place, and time.  Psychiatric:        Mood and Affect: Mood normal.        Behavior: Behavior normal.     BP 124/74   Pulse 78   Ht 5' (1.524 m)   Wt 108 lb 9.6 oz (49.3 kg)   SpO2 92%   BMI 21.21 kg/m  Wt Readings from Last 3 Encounters:  12/07/21 108 lb 9.6 oz (49.3 kg)  08/14/21 110 lb (49.9 kg)  06/18/21 110 lb 1.3 oz (49.9 kg)        Assessment & Plan:   Problem List Items Addressed This Visit    Visit Diagnoses     Right leg pain    -  Primary Right leg pain and swelling after prolonged immobilization Need to rule out DVT Check US Doppler RLE  stat If negative for DVT, will treat for neuropathy and/or tendinitis   Relevant Orders   US Venous Img Lower Unilateral Right   Prolonged immobilization       Relevant Orders   US Venous Img Lower Unilateral Right      Addendum: US Doppler of RLE was negative for acute DVT.  Her leg pain could be due to muscle strain, advised to take Flexeril as needed.   No orders of the defined types were placed in this encounter.    Lindell Spar, MD

## 2021-12-07 NOTE — Patient Instructions (Signed)
Please get [place] of leg to rule out DVT of lower extremity.

## 2021-12-11 DIAGNOSIS — M4326 Fusion of spine, lumbar region: Secondary | ICD-10-CM | POA: Diagnosis not present

## 2021-12-11 DIAGNOSIS — G894 Chronic pain syndrome: Secondary | ICD-10-CM | POA: Diagnosis not present

## 2021-12-11 DIAGNOSIS — Z79899 Other long term (current) drug therapy: Secondary | ICD-10-CM | POA: Diagnosis not present

## 2021-12-11 DIAGNOSIS — M5442 Lumbago with sciatica, left side: Secondary | ICD-10-CM | POA: Diagnosis not present

## 2021-12-11 DIAGNOSIS — M545 Low back pain, unspecified: Secondary | ICD-10-CM | POA: Diagnosis not present

## 2021-12-13 ENCOUNTER — Encounter: Payer: Medicare Other | Admitting: Internal Medicine

## 2021-12-20 ENCOUNTER — Other Ambulatory Visit: Payer: Self-pay | Admitting: Internal Medicine

## 2021-12-20 DIAGNOSIS — J302 Other seasonal allergic rhinitis: Secondary | ICD-10-CM

## 2021-12-20 DIAGNOSIS — B370 Candidal stomatitis: Secondary | ICD-10-CM

## 2021-12-25 DIAGNOSIS — Z79899 Other long term (current) drug therapy: Secondary | ICD-10-CM | POA: Diagnosis not present

## 2021-12-25 DIAGNOSIS — Z79891 Long term (current) use of opiate analgesic: Secondary | ICD-10-CM | POA: Diagnosis not present

## 2021-12-27 ENCOUNTER — Encounter: Payer: Self-pay | Admitting: Internal Medicine

## 2021-12-27 ENCOUNTER — Ambulatory Visit (INDEPENDENT_AMBULATORY_CARE_PROVIDER_SITE_OTHER): Payer: Medicare Other | Admitting: Internal Medicine

## 2021-12-27 VITALS — BP 128/68 | HR 97 | Resp 18 | Ht 60.0 in | Wt 106.6 lb

## 2021-12-27 DIAGNOSIS — D751 Secondary polycythemia: Secondary | ICD-10-CM

## 2021-12-27 DIAGNOSIS — E039 Hypothyroidism, unspecified: Secondary | ICD-10-CM | POA: Diagnosis not present

## 2021-12-27 DIAGNOSIS — I10 Essential (primary) hypertension: Secondary | ICD-10-CM | POA: Diagnosis not present

## 2021-12-27 DIAGNOSIS — R7303 Prediabetes: Secondary | ICD-10-CM | POA: Diagnosis not present

## 2021-12-27 DIAGNOSIS — E782 Mixed hyperlipidemia: Secondary | ICD-10-CM | POA: Diagnosis not present

## 2021-12-27 DIAGNOSIS — M4316 Spondylolisthesis, lumbar region: Secondary | ICD-10-CM

## 2021-12-27 DIAGNOSIS — E559 Vitamin D deficiency, unspecified: Secondary | ICD-10-CM | POA: Diagnosis not present

## 2021-12-27 DIAGNOSIS — Z23 Encounter for immunization: Secondary | ICD-10-CM | POA: Diagnosis not present

## 2021-12-27 DIAGNOSIS — Z0001 Encounter for general adult medical examination with abnormal findings: Secondary | ICD-10-CM | POA: Diagnosis not present

## 2021-12-27 NOTE — Assessment & Plan Note (Signed)
Lab Results  Component Value Date   TSH 1.110 02/09/2021   Was controlled with Levothyroxine 25 mcg QD, but she has stopped taking it as she thought it was contributing to headache - resolved now Check TSH and free T4

## 2021-12-27 NOTE — Assessment & Plan Note (Signed)
BP Readings from Last 1 Encounters:  12/27/21 128/68   Well-controlled with Amlodipine 10 mg QD Counseled for compliance with the medications Advised DASH diet and moderate exercise/walking, at least 150 mins/week

## 2021-12-27 NOTE — Assessment & Plan Note (Addendum)
Chronic low back pain S/p spinal fusion surgery On Gabapentin and Percocet, f/u pain clinic Referred to PT

## 2021-12-27 NOTE — Progress Notes (Signed)
Established Patient Office Visit  Subjective:  Patient ID: [name] [name], female    DOB: 05/10/1955  Age: 66 y.o. MRN: [id number]  CC:  Chief Complaint  Patient presents with   Annual Exam    Annual exam pt had tooth pulled 12-12-21 got dry socket it is still hurting     HPI [name] [name] is a 66 y.o. female with past medical history of hypertension, hypothyroidism, Crohn's disease status post partial bowel resection, GERD, DJD of cervical and lumbar spine, s/p lumbar fusion surgery, chronic pain syndrome and tobacco abuse who presents for annual physical.  BP is well-controlled. Takes medications regularly. Patient denies headache, dizziness, chest pain, dyspnea or palpitations.  She has a history of chronic pain syndrome due to DDD of lumbar spine, for which she follows up with pain clinic.  She is on gabapentin and Percocet as needed.  She has done PT in the past, and had felt better with it.  She currently complains of cramping in the right leg.  She recently had US Doppler, which was negative for DVT.  She complains of chronic fatigue.  Of note, she has stopped taking levothyroxine as she was having headache after taking it.  She denies any recent change in appetite or weight, tremors or palpitations.  She had a molar tooth removed 2 weeks ago, and still c/o mild pain in the area. She denies any bleeding or discharge. She denies any fever or chills. Her oral cavity does not show any abscess.  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   Crohn disease ([place])    CTS (carpal tunnel syndrome)    Diarrhea MULITIFACTORIAL   IBS, LACTOSE INTOLERANCE, SBBO, BILE-SALT   Elevated liver enzymes 2009: BMI 24 ?Etoh ALT 94, AST 51,  NEG ANA, qIGs& ASMA   MAY 2012 AST 52 ALT 38   Esophageal stricture 2009    GERD (gastroesophageal reflux disease)    Hemorrhoid    Hyperlipemia    Hypertension    Inflammatory bowel disease 2006 CD   SURGICAL REMISSION   LBP (low back pain)    Mental disorder    Migraine    Osteoporosis    PONV (postoperative nausea and vomiting)    Shortness of breath    exertion and humidity   Shoulder pain    right   Sleep apnea    Stop Bang Cock score of 4    Past Surgical History:  Procedure Laterality Date   BACK SURGERY     BACK SURGERY  07/16/2018   BILATERAL SALPINGOOPHORECTOMY     BIOPSY  12/24/2011   Procedure: BIOPSY;  Surgeon: Danie Binder, MD;  Location: [facility];  Service: Endoscopy;;  Gastric Biopsies   BIOPSY N/A 06/30/2012   Procedure: BIOPSY;  Surgeon: Danie Binder, MD;  Location: [facility];  Service: Endoscopy;  Laterality: N/A;  Gastric and Esophageal Biopsies   CARPAL TUNNEL RELEASE  LEFT   CATARACT EXTRACTION W/PHACO Right 07/30/2016   Procedure: CATARACT EXTRACTION PHACO AND INTRAOCULAR LENS PLACEMENT ([place]);  Surgeon: Rutherford Guys, MD;  Location: [facility];  Service: Ophthalmology;  Laterality: Right;  CDE: 5.45   CATARACT EXTRACTION W/PHACO Left 08/13/2016   Procedure: CATARACT EXTRACTION PHACO AND INTRAOCULAR  LENS PLACEMENT ([facility]);  Surgeon: Rutherford Guys, MD;  Location: [facility];  Service: Ophthalmology;  Laterality: Left;  CDE: 5.59   COLON SURGERY     COLONOSCOPY  JAN 2009 DIARRHEA, ABD PAIN, BRBPR   ANASTOMOTIC ULCER(3MM), IH AC:ZYSAYT ULCER, NL COLON bX   COLONOSCOPY WITH PROPOFOL N/A 04/21/2018   examined portion of the ileum normal, two 3-5 mm polyps, diverticulosis in the rectosigmoid and sigmoid colon, external and internal hemorrhoids, significant looping of the colon.  Pathology with tubular adenomas.  Recommendations to follow high-fiber diet and repeat colonoscopy in 5 to 10 years with peds colonoscope.   DILATION AND CURETTAGE OF UTERUS     ESOPHAGEAL DILATION  12/24/2011   SLF: A stricture was found in the distal esophagus/ Moderate  gastritis/ MILD Duodenitis   ESOPHAGOGASTRODUODENOSCOPY  02/07/09   mild gastritis   ESOPHAGOGASTRODUODENOSCOPY (EGD) WITH PROPOFOL N/A 06/30/2012   SLF: 1. No definite stricture appreciated 2. small hiatal hernia 3. Gastritis   ESOPHAGOGASTRODUODENOSCOPY (EGD) WITH PROPOFOL N/A 02/20/2016   Procedure: ESOPHAGOGASTRODUODENOSCOPY (EGD) WITH PROPOFOL;  Surgeon: Danie Binder, MD;  [facility];  Service: Endoscopy;  Laterality: N/A;  930   ESOPHAGOGASTRODUODENOSCOPY (EGD) WITH PROPOFOL N/A 11/24/2018   benign-appearing esophageal peptic stricture s/p dilation, small hiatal hernia, moderate erosive gastritis.    EYE SURGERY     gum flap Bilateral    HEMICOLECTOMY  RIGHT 2006   HERNIA REPAIR     LUMBAR DISC SURGERY     POLYPECTOMY  04/21/2018   Procedure: POLYPECTOMY;  Surgeon: Danie Binder, MD;  [facility];  Service: Endoscopy;;  (colon)   SAVORY DILATION N/A 06/30/2012   Procedure: SAVORY DILATION;  Surgeon: Danie Binder, MD;  Location: [facility];  Service: Endoscopy;  Laterality: N/A;  12.62m , 196m 1536m SAVORY DILATION N/A 02/20/2016   Procedure: SAVORY DILATION;  Surgeon: SanDanie BinderD;  [facility];  Service: Endoscopy;  Laterality: N/A;   SAVORY DILATION N/A 11/24/2018   Procedure: SAVORY DILATION;  Surgeon: FieDanie BinderD;  [facility];  Service: Endoscopy;  Laterality: N/A;   UPPER GASTROINTESTINAL ENDOSCOPY  JAN 2009 ABD PAIN   GASTRITIS, ESO RING   UPPER GASTROINTESTINAL ENDOSCOPY  OCT 2010 ABD PAIN, DYSPHAGIA   DIL 15MM, GASTRITIS, NL DUODENUM   UPPER GASTROINTESTINAL ENDOSCOPY  OCT 2010 DYSPHAGIA   DIL 17 MM, GASTRITIS, NL DUODENUM   vocal cord surgery  Sep 20, 2010   precancerous areas removed   wisdom tooth extraction Right    pin placement due to broken jaw    Family History  Problem Relation Age of Onset   Hypothyroidism Mother    Heart disease Father    Parkinson's disease Father    Hypothyroidism Daughter     Heart attack Paternal Grandfather    Alzheimer's disease Paternal Grandmother    Heart attack Maternal Grandfather    Crohn's disease Sister    Other Sister        was murdered   Crohn's disease Maternal Uncle    Colon cancer Neg Hx    Colon polyps Neg Hx     Social History   Socioeconomic History   Marital status: Single    Spouse name: WayPatrick JupiterNumber of children: 2   Years of education: GED   Highest education level: Not on file  Occupational History    Comment: disabled  Tobacco Use   Smoking status: Every Day  Packs/day: 0.25    Years: 30.00    Total pack years: 7.50    Types: Cigarettes   Smokeless tobacco: Never  Vaping Use   Vaping Use: Never used  Substance and Sexual Activity   Alcohol use: Not Currently    Comment: wine/beer a few times a week    Drug use: No   Sexual activity: Not Currently    Birth control/protection: Post-menopausal  Other Topics Concern   Not on file  Social History Narrative   Lives with husband   no caffiene   Social Determinants of Health   Financial Resource Strain: Low Risk  (01/13/2021)   Overall Financial Resource Strain (CARDIA)    Difficulty of Paying Living Expenses: Not hard at all  Food Insecurity: No Food Insecurity (01/13/2021)   Hunger Vital Sign    Worried About Running Out of Food in the Last Year: Never true    Ran Out of Food in the Last Year: Never true  Transportation Needs: No Transportation Needs (01/13/2021)   PRAPARE - Hydrologist (Medical): No    Lack of Transportation (Non-Medical): No  Physical Activity: Insufficiently Active (01/13/2021)   Exercise Vital Sign    Days of Exercise per Week: 3 days    Minutes of Exercise per Session: 30 min  Stress: No Stress Concern Present (01/13/2021)   [place]    Feeling of Stress : Not at all  Social Connections: Moderately Integrated (01/13/2021)   Social  Connection and Isolation Panel [NHANES]    Frequency of Communication with Friends and Family: More than three times a week    Frequency of Social Gatherings with Friends and Family: More than three times a week    Attends Religious Services: More than 4 times per year    Active Member of [employer] or Organizations: No    Attends Archivist Meetings: Never    Marital Status: Married  Human resources officer Violence: Not At Risk (01/13/2021)   Humiliation, Afraid, Rape, and Kick questionnaire    Fear of Current or Ex-Partner: No    Emotionally Abused: No    Physically Abused: No    Sexually Abused: No    Outpatient Medications Prior to Visit  Medication Sig Dispense Refill   albuterol (VENTOLIN HFA) 108 (90 Base) MCG/ACT inhaler Inhale 2 puffs into the lungs every 6 (six) hours as needed for wheezing or shortness of breath. 18 g 11   amLODipine (NORVASC) 10 MG tablet Take 1 tablet by mouth once daily 90 tablet 0   benzonatate (TESSALON) 100 MG capsule TAKE 1 CAPSULE BY MOUTH TWICE DAILY AS NEEDED FOR COUGH 30 capsule 0   calcium carbonate (TUMS - DOSED IN MG ELEMENTAL CALCIUM) 500 MG chewable tablet Chew 1 tablet (200 mg of elemental calcium total) by mouth 3 (three) times daily with meals. 90 tablet 6   clotrimazole (CLOTRIMAZOLE AF) 1 % cream Apply 1 application topically 2 (two) times daily. To affected area for 1-2 weeks 30 g 0   cyclobenzaprine (FLEXERIL) 10 MG tablet Take 10 mg by mouth as needed for muscle spasms.      dexlansoprazole (DEXILANT) 60 MG capsule 1 PO EVERY MORNING WITH BREAKFAST. 90 capsule 3   fluticasone (FLONASE) 50 MCG/ACT nasal spray USE TWO SPRAY(S) IN EACH NOSTRIL ONCE DAILY AS NEEDED FOR ALLERGY OR RHINITIS 16 g 2   gabapentin (NEURONTIN) 400 MG capsule Take 1 capsule by mouth 4 times  daily 90 capsule 0   lactase (LACTAID) 3000 units tablet Take 3,000 Units by mouth daily as needed (lactose intolerance).     levothyroxine (SYNTHROID) 25 MCG tablet Take 1 tablet  by mouth once daily 30 tablet 2   Lidocaine HCl 4 % LIQD Apply 1 application topically 3 (three) times daily as needed (pain).     loratadine (CLARITIN) 10 MG tablet Take 1 tablet (10 mg total) by mouth daily. 30 tablet 3   LORazepam (ATIVAN) 0.5 MG tablet Take 0.5 mg by mouth at bedtime as needed.     magnesium hydroxide (MILK OF MAGNESIA) 400 MG/5ML suspension Take 30 mLs by mouth daily as needed for mild constipation.     Menthol 10 % AERO Apply 1 application topically 3 (three) times daily as needed (pain).     nystatin (MYCOSTATIN) 100000 UNIT/ML suspension TAKE 5 ML BY MOUTH  4 TIMES DAILY AS NEEDED FOR THRUSH 500 mL 0   oxyCODONE-acetaminophen (PERCOCET) 10-325 MG tablet Take 1 tablet by mouth 3 (three) times daily as needed.     SYMBICORT 80-4.5 MCG/ACT inhaler Inhale 2 puffs by mouth twice daily 33 g 0   triamcinolone (KENALOG) 0.1 % Apply 1 application topically 2 (two) times daily. 30 g 0   UNABLE TO FIND Capsule to rebuild cells-BID     UNABLE TO FIND as needed. Penetrex cream     valACYclovir (VALTREX) 1000 MG tablet Take 1 tablet (1,000 mg total) by mouth 2 (two) times daily. 14 tablet 0   No facility-administered medications prior to visit.    Allergies  Allergen Reactions   Naproxen Shortness Of Breath    Chest pain   Dicyclomine Hcl Other (See Comments)    Vision changes   Duloxetine Other (See Comments)    Suicidal    Eggs Or Egg-Derived Products Diarrhea    abd pain   Lovastatin Other (See Comments)    Chest pain   Toradol [Ketorolac Tromethamine] Other (See Comments)    Headache. Non-effective   Tramadol Other (See Comments)    Headache. Non-effective.   Nexium [Esomeprazole Magnesium] Diarrhea    Abdominal pain    ROS Review of Systems  Constitutional:  Negative for chills and fever.  HENT:  Negative for congestion, sinus pressure, sinus pain and sore throat.   Eyes:  Negative for pain and discharge.  Respiratory:  Negative for cough and shortness of  breath.   Cardiovascular:  Negative for chest pain and palpitations.  Gastrointestinal:  Positive for abdominal pain (chronic). Negative for constipation, diarrhea, nausea and vomiting.  Endocrine: Negative for polydipsia and polyuria.  Genitourinary:  Negative for dysuria and hematuria.  Musculoskeletal:  Positive for arthralgias, back pain and neck pain. Negative for neck stiffness.       Right leg pain  Skin:  Negative for rash.  Neurological:  Positive for numbness. Negative for dizziness and weakness.  Psychiatric/Behavioral:  Negative for agitation and behavioral problems. The patient is nervous/anxious.       Objective:    Physical Exam Vitals reviewed.  Constitutional:      General: She is not in acute distress.    Appearance: She is not diaphoretic.  HENT:     Head: Normocephalic and atraumatic.     Nose: Nose normal.     Mouth/Throat:     Mouth: Mucous membranes are moist.  Eyes:     General: No scleral icterus.    Extraocular Movements: Extraocular movements intact.  Cardiovascular:  Rate and Rhythm: Normal rate and regular rhythm.     Heart sounds: Normal heart sounds. No murmur heard.    Comments: DPA pulse intact - b/l Pulmonary:     Breath sounds: Normal breath sounds. No wheezing or rales.  Abdominal:     Palpations: Abdomen is soft.     Tenderness: There is abdominal tenderness (Mild, generalized).  Musculoskeletal:        General: Tenderness (Lumbar spine area) present.     Cervical back: Neck supple. No tenderness.     Right lower leg: No edema.     Left lower leg: No edema.  Skin:    General: Skin is warm.  Neurological:     General: No focal deficit present.     Mental Status: She is alert and oriented to person, place, and time.     Sensory: No sensory deficit.     Motor: Weakness (B/l LE - 4/5) present.  Psychiatric:        Mood and Affect: Mood normal.        Behavior: Behavior normal.     BP 128/68 (BP Location: Right Arm, Patient  Position: Sitting, Cuff Size: Normal)   Pulse 97   Resp 18   Ht 5' (1.524 m)   Wt 106 lb 9.6 oz (48.4 kg)   SpO2 90%   BMI 20.82 kg/m  Wt Readings from Last 3 Encounters:  12/27/21 106 lb 9.6 oz (48.4 kg)  12/07/21 108 lb 9.6 oz (49.3 kg)  08/14/21 110 lb (49.9 kg)    Lab Results  Component Value Date   TSH 1.110 02/09/2021   Lab Results  Component Value Date   WBC 4.6 02/09/2021   HGB 18.1 (H) 02/09/2021   HCT 51.8 (H) 02/09/2021   MCV 103 (H) 02/09/2021   PLT 197 02/09/2021   Lab Results  Component Value Date   NA 139 02/09/2021   K 3.5 02/09/2021   CO2 22 02/09/2021   GLUCOSE 91 02/09/2021   BUN 7 (L) 02/09/2021   CREATININE 0.65 02/09/2021   BILITOT 0.7 02/09/2021   ALKPHOS 114 02/09/2021   AST 49 (H) 02/09/2021   ALT 38 (H) 02/09/2021   PROT 6.8 02/09/2021   ALBUMIN 4.3 02/09/2021   CALCIUM 9.7 02/09/2021   ANIONGAP 12 01/31/2020   EGFR 99 02/09/2021   Lab Results  Component Value Date   CHOL 239 (H) 02/09/2021   Lab Results  Component Value Date   HDL 140 02/09/2021   Lab Results  Component Value Date   LDLCALC 84 02/09/2021   Lab Results  Component Value Date   TRIG 93 02/09/2021   Lab Results  Component Value Date   CHOLHDL 1.7 02/09/2021   No results found for: "HGBA1C"    Assessment & Plan:   Problem List Items Addressed This Visit       Cardiovascular and Mediastinum   Essential hypertension    BP Readings from Last 1 Encounters:  12/27/21 128/68  Well-controlled with Amlodipine 10 mg QD Counseled for compliance with the medications Advised DASH diet and moderate exercise/walking, at least 150 mins/week        Endocrine   Hypothyroidism    Lab Results  Component Value Date   TSH 1.110 02/09/2021  Was controlled with Levothyroxine 25 mcg QD, but she has stopped taking it as she thought it was contributing to headache - resolved now Check TSH and free T4      Relevant Orders   CMP14+EGFR  TSH + free T4      Musculoskeletal and Integument   Spondylolisthesis, lumbar region    Chronic low back pain S/p spinal fusion surgery On Gabapentin and Percocet, f/u pain clinic Referred to PT      Relevant Orders   Ambulatory referral to Physical Therapy     Other   Hyperlipidemia    Check lipid profile Advised to follow low cholesterol diet for now      Relevant Orders   Lipid panel   Polycythemia    Chronic elevated Hb, likely due to her smoking history Will check CBC periodically      Relevant Orders   CBC with Differential/Platelet   Encounter for general adult medical examination with abnormal findings - Primary    Physical exam as documented. Counseling done  re healthy lifestyle involving commitment to 150 minutes exercise per week, heart healthy diet, and attaining healthy weight.The importance of adequate sleep also discussed. Changes in health habits are decided on by the patient with goals and time frames  set for achieving them. Immunization and cancer screening needs are specifically addressed at this visit.      Relevant Orders   CMP14+EGFR   CBC with Differential/Platelet   Other Visit Diagnoses     Prediabetes       Relevant Orders   Hemoglobin A1c   Vitamin D deficiency       Relevant Orders   VITAMIN D 25 Hydroxy (Vit-D Deficiency, Fractures)   Need for immunization against influenza       Relevant Orders   Flu Vaccine QUAD 3moIM (Fluarix, Fluzone & Alfiuria Quad PF) (Completed)       No orders of the defined types were placed in this encounter.   Follow-up: Return in about 6 months (around 06/29/2022) for HTN and hypothyroidism.    RLindell Spar MD

## 2021-12-27 NOTE — Assessment & Plan Note (Signed)
Chronic elevated Hb, likely due to her smoking history Will check CBC periodically

## 2021-12-27 NOTE — Assessment & Plan Note (Signed)

## 2021-12-27 NOTE — Assessment & Plan Note (Signed)
Check lipid profile Advised to follow low cholesterol diet for now

## 2021-12-27 NOTE — Patient Instructions (Signed)
Please continue taking medications as prescribed.  Please continue to follow low salt diet and ambulate as tolerated. 

## 2021-12-28 ENCOUNTER — Other Ambulatory Visit: Payer: Self-pay | Admitting: Internal Medicine

## 2021-12-28 LAB — VITAMIN D 25 HYDROXY (VIT D DEFICIENCY, FRACTURES): Vit D, 25-Hydroxy: 97.2 ng/mL (ref 30.0–100.0)

## 2021-12-28 LAB — CMP14+EGFR
ALT: 41 IU/L — ABNORMAL HIGH (ref 0–32)
AST: 102 IU/L — ABNORMAL HIGH (ref 0–40)
Albumin/Globulin Ratio: 1.7 (ref 1.2–2.2)
Albumin: 4.4 g/dL (ref 3.9–4.9)
Alkaline Phosphatase: 134 IU/L — ABNORMAL HIGH (ref 44–121)
BUN/Creatinine Ratio: 11 — ABNORMAL LOW (ref 12–28)
BUN: 5 mg/dL — ABNORMAL LOW (ref 8–27)
Bilirubin Total: 0.5 mg/dL (ref 0.0–1.2)
CO2: 21 mmol/L (ref 20–29)
Calcium: 9.2 mg/dL (ref 8.7–10.3)
Chloride: 94 mmol/L — ABNORMAL LOW (ref 96–106)
Creatinine, Ser: 0.46 mg/dL — ABNORMAL LOW (ref 0.57–1.00)
Globulin, Total: 2.6 g/dL (ref 1.5–4.5)
Glucose: 78 mg/dL (ref 70–99)
Potassium: 3.8 mmol/L (ref 3.5–5.2)
Sodium: 137 mmol/L (ref 134–144)
Total Protein: 7 g/dL (ref 6.0–8.5)
eGFR: 107 mL/min/{1.73_m2} (ref >59)

## 2021-12-28 LAB — CBC WITH DIFFERENTIAL/PLATELET
Basophils Absolute: 0 10*3/uL (ref 0.0–0.2)
Basos: 1 %
EOS (ABSOLUTE): 0 10*3/uL (ref 0.0–0.4)
Eos: 1 %
Hematocrit: 45.2 % (ref 34.0–46.6)
Hemoglobin: 16 g/dL — ABNORMAL HIGH (ref 11.1–15.9)
Immature Grans (Abs): 0 10*3/uL (ref 0.0–0.1)
Immature Granulocytes: 0 %
Lymphocytes Absolute: 1.5 10*3/uL (ref 0.7–3.1)
Lymphs: 46 %
MCH: 36.4 pg — ABNORMAL HIGH (ref 26.6–33.0)
MCHC: 35.4 g/dL (ref 31.5–35.7)
MCV: 103 fL — ABNORMAL HIGH (ref 79–97)
Monocytes Absolute: 0.4 10*3/uL (ref 0.1–0.9)
Monocytes: 12 %
Neutrophils Absolute: 1.3 10*3/uL — ABNORMAL LOW (ref 1.4–7.0)
Neutrophils: 40 %
Platelets: 202 10*3/uL (ref 150–450)
RBC: 4.4 x10E6/uL (ref 3.77–5.28)
RDW: 13.2 % (ref 11.7–15.4)
WBC: 3.3 10*3/uL — ABNORMAL LOW (ref 3.4–10.8)

## 2021-12-28 LAB — LIPID PANEL
Chol/HDL Ratio: 1.6 ratio (ref 0.0–4.4)
Cholesterol, Total: 291 mg/dL — ABNORMAL HIGH (ref 100–199)
HDL: 183 mg/dL (ref >39)
LDL Chol Calc (NIH): 97 mg/dL (ref 0–99)
Triglycerides: 73 mg/dL (ref 0–149)
VLDL Cholesterol Cal: 11 mg/dL (ref 5–40)

## 2021-12-28 LAB — TSH+FREE T4
Free T4: 1.22 ng/dL (ref 0.82–1.77)
TSH: 1.79 u[IU]/mL (ref 0.450–4.500)

## 2021-12-28 LAB — HEMOGLOBIN A1C
Est. average glucose Bld gHb Est-mCnc: 94 mg/dL
Hgb A1c MFr Bld: 4.9 % (ref 4.8–5.6)

## 2022-01-08 DIAGNOSIS — G894 Chronic pain syndrome: Secondary | ICD-10-CM | POA: Diagnosis not present

## 2022-01-08 DIAGNOSIS — M4326 Fusion of spine, lumbar region: Secondary | ICD-10-CM | POA: Diagnosis not present

## 2022-01-08 DIAGNOSIS — Z79899 Other long term (current) drug therapy: Secondary | ICD-10-CM | POA: Diagnosis not present

## 2022-01-08 DIAGNOSIS — M5442 Lumbago with sciatica, left side: Secondary | ICD-10-CM | POA: Diagnosis not present

## 2022-01-08 DIAGNOSIS — M549 Dorsalgia, unspecified: Secondary | ICD-10-CM | POA: Diagnosis not present

## 2022-01-14 ENCOUNTER — Ambulatory Visit (INDEPENDENT_AMBULATORY_CARE_PROVIDER_SITE_OTHER): Payer: Medicare Other

## 2022-01-14 DIAGNOSIS — Z0000 Encounter for general adult medical examination without abnormal findings: Secondary | ICD-10-CM

## 2022-01-14 NOTE — Progress Notes (Signed)
Subjective:   [name] [name] is a 66 y.o. female who presents for Medicare Annual (Subsequent) preventive examination.  Review of Systems    I connected with  [name] [name] on 01/14/22 by a audio enabled telemedicine application and verified that I am speaking with the correct person using two identifiers.  Patient Location: Home  Provider Location: Office/Clinic  I discussed the limitations of evaluation and management by telemedicine. The patient expressed understanding and agreed to proceed.        Objective:    There were no vitals filed for this visit. There is no height or weight on file to calculate BMI.     07/11/2021    2:16 PM 01/13/2021    9:14 AM 11/24/2018   11:19 AM 07/16/2018   12:30 PM 07/09/2018   11:16 AM 04/21/2018   11:29 AM 04/14/2018   12:54 PM  Advanced Directives  Does Patient Have a Medical Advance Directive? No No No No No No No  Would patient like information on creating a medical advance directive? No - Patient declined Yes (MAU/Ambulatory/Procedural Areas - Information given) No - Patient declined No - Patient declined No - Patient declined No - Patient declined No - Patient declined    Current Medications (verified) Outpatient Encounter Medications as of 01/14/2022  Medication Sig   albuterol (VENTOLIN HFA) 108 (90 Base) MCG/ACT inhaler Inhale 2 puffs into the lungs every 6 (six) hours as needed for wheezing or shortness of breath.   amLODipine (NORVASC) 10 MG tablet Take 1 tablet by mouth once daily   benzonatate (TESSALON) 100 MG capsule TAKE 1 CAPSULE BY MOUTH TWICE DAILY AS NEEDED FOR COUGH   calcium carbonate (TUMS - DOSED IN MG ELEMENTAL CALCIUM) 500 MG chewable tablet Chew 1 tablet (200 mg of elemental calcium total) by mouth 3 (three) times daily with meals.   clotrimazole (CLOTRIMAZOLE AF) 1 % cream Apply 1 application topically 2 (two) times daily. To affected area for 1-2 weeks   cyclobenzaprine (FLEXERIL) 10 MG tablet Take 10 mg by mouth  as needed for muscle spasms.    dexlansoprazole (DEXILANT) 60 MG capsule 1 PO EVERY MORNING WITH BREAKFAST.   fluticasone (FLONASE) 50 MCG/ACT nasal spray USE TWO SPRAY(S) IN EACH NOSTRIL ONCE DAILY AS NEEDED FOR ALLERGY OR RHINITIS   gabapentin (NEURONTIN) 400 MG capsule Take 1 capsule by mouth 4 times daily   lactase (LACTAID) 3000 units tablet Take 3,000 Units by mouth daily as needed (lactose intolerance).   Lidocaine HCl 4 % LIQD Apply 1 application topically 3 (three) times daily as needed (pain).   loratadine (CLARITIN) 10 MG tablet Take 1 tablet (10 mg total) by mouth daily.   LORazepam (ATIVAN) 0.5 MG tablet Take 0.5 mg by mouth at bedtime as needed.   magnesium hydroxide (MILK OF MAGNESIA) 400 MG/5ML suspension Take 30 mLs by mouth daily as needed for mild constipation.   Menthol 10 % AERO Apply 1 application topically 3 (three) times daily as needed (pain).   nystatin (MYCOSTATIN) 100000 UNIT/ML suspension TAKE 5 ML BY MOUTH  4 TIMES DAILY AS NEEDED FOR THRUSH   oxyCODONE-acetaminophen (PERCOCET) 10-325 MG tablet Take 1 tablet by mouth 3 (three) times daily as needed.   SYMBICORT 80-4.5 MCG/ACT inhaler Inhale 2 puffs by mouth twice daily   triamcinolone (KENALOG) 0.1 % Apply 1 application topically 2 (two) times daily.   UNABLE TO FIND Capsule to rebuild cells-BID   UNABLE TO FIND as needed. Penetrex cream  valACYclovir (VALTREX) 1000 MG tablet Take 1 tablet (1,000 mg total) by mouth 2 (two) times daily.   [DISCONTINUED] benzonatate (TESSALON) 100 MG capsule TAKE 1 CAPSULE BY MOUTH TWICE DAILY AS NEEDED FOR COUGH   [DISCONTINUED] levothyroxine (SYNTHROID) 25 MCG tablet Take 1 tablet by mouth once daily   [DISCONTINUED] nystatin (MYCOSTATIN) 100000 UNIT/ML suspension TAKE 5 ML BY MOUTH  4 TIMES DAILY AS NEEDED FOR THRUSH   No facility-administered encounter medications on file as of 01/14/2022.    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   Crohn disease ([place])    CTS (carpal tunnel syndrome)    Diarrhea MULITIFACTORIAL   IBS, LACTOSE INTOLERANCE, SBBO, BILE-SALT   Elevated liver enzymes 2009: BMI 24 ?Etoh ALT 94, AST 51,  NEG ANA, qIGs& ASMA   MAY 2012 AST 52 ALT 38   Esophageal stricture 2009   GERD (gastroesophageal reflux disease)    Hemorrhoid    Hyperlipemia    Hypertension    Inflammatory bowel disease 2006 CD   SURGICAL REMISSION   LBP (low back pain)    Mental disorder    Migraine    Osteoporosis    PONV (postoperative nausea and vomiting)    Shortness of breath    exertion and humidity   Shoulder pain    right   Sleep apnea    Stop Bang Cock score of 4   Past Surgical History:  Procedure Laterality Date   BACK SURGERY     BACK SURGERY  07/16/2018   BILATERAL SALPINGOOPHORECTOMY     BIOPSY  12/24/2011   Procedure: BIOPSY;  Surgeon: Danie Binder, MD;  Location: [facility];  Service: Endoscopy;;  Gastric Biopsies   BIOPSY N/A 06/30/2012   Procedure: BIOPSY;  Surgeon: Danie Binder, MD;  Location: [facility];  Service: Endoscopy;  Laterality: N/A;  Gastric and Esophageal Biopsies   CARPAL TUNNEL RELEASE  LEFT   CATARACT EXTRACTION W/PHACO Right 07/30/2016   Procedure: CATARACT EXTRACTION PHACO AND INTRAOCULAR LENS PLACEMENT ([place]);  Surgeon: Rutherford Guys, MD;  Location: [facility];  Service: Ophthalmology;  Laterality: Right;  CDE: 5.45   CATARACT EXTRACTION W/PHACO Left 08/13/2016   Procedure: CATARACT EXTRACTION PHACO AND INTRAOCULAR LENS PLACEMENT ([facility]);  Surgeon: Rutherford Guys, MD;  Location: [facility];  Service: Ophthalmology;  Laterality: Left;  CDE: 5.59   COLON SURGERY      COLONOSCOPY  JAN 2009 DIARRHEA, ABD PAIN, BRBPR   ANASTOMOTIC ULCER(3MM), IH FX:JOITGP ULCER, NL COLON bX   COLONOSCOPY WITH PROPOFOL N/A 04/21/2018   examined portion of the ileum normal, two 3-5 mm polyps, diverticulosis in the rectosigmoid and sigmoid colon, external and internal hemorrhoids, significant looping of the colon.  Pathology with tubular adenomas.  Recommendations to follow high-fiber diet and repeat colonoscopy in 5 to 10 years with peds colonoscope.   DILATION AND CURETTAGE OF UTERUS     ESOPHAGEAL DILATION  12/24/2011   SLF: A stricture was found in the distal esophagus/ Moderate gastritis/ MILD Duodenitis   ESOPHAGOGASTRODUODENOSCOPY  02/07/09   mild gastritis   ESOPHAGOGASTRODUODENOSCOPY (EGD) WITH PROPOFOL N/A 06/30/2012  SLF: 1. No definite stricture appreciated 2. small hiatal hernia 3. Gastritis   ESOPHAGOGASTRODUODENOSCOPY (EGD) WITH PROPOFOL N/A 02/20/2016   Procedure: ESOPHAGOGASTRODUODENOSCOPY (EGD) WITH PROPOFOL;  Surgeon: Danie Binder, MD;  [facility];  Service: Endoscopy;  Laterality: N/A;  930   ESOPHAGOGASTRODUODENOSCOPY (EGD) WITH PROPOFOL N/A 11/24/2018   benign-appearing esophageal peptic stricture s/p dilation, small hiatal hernia, moderate erosive gastritis.    EYE SURGERY     gum flap Bilateral    HEMICOLECTOMY  RIGHT 2006   HERNIA REPAIR     LUMBAR DISC SURGERY     POLYPECTOMY  04/21/2018   Procedure: POLYPECTOMY;  Surgeon: Danie Binder, MD;  [facility];  Service: Endoscopy;;  (colon)   SAVORY DILATION N/A 06/30/2012   Procedure: SAVORY DILATION;  Surgeon: Danie Binder, MD;  Location: [facility];  Service: Endoscopy;  Laterality: N/A;  12.84m , 128m 1597m SAVORY DILATION N/A 02/20/2016   Procedure: SAVORY DILATION;  Surgeon: SanDanie BinderD;  [facility];  Service: Endoscopy;  Laterality: N/A;   SAVORY DILATION N/A 11/24/2018   Procedure: SAVORY DILATION;  Surgeon: FieDanie BinderD;  [facility];  Service: Endoscopy;  Laterality: N/A;   UPPER GASTROINTESTINAL ENDOSCOPY  JAN 2009 ABD PAIN   GASTRITIS, ESO RING   UPPER GASTROINTESTINAL ENDOSCOPY  OCT 2010 ABD PAIN, DYSPHAGIA   DIL 15MM, GASTRITIS, NL DUODENUM   UPPER GASTROINTESTINAL ENDOSCOPY  OCT 2010 DYSPHAGIA   DIL 17 MM, GASTRITIS, NL DUODENUM   vocal cord surgery  Sep 20, 2010   precancerous areas removed   wisdom tooth extraction Right    pin placement due to broken jaw   Family History  Problem Relation Age of Onset   Hypothyroidism Mother    Heart disease Father    Parkinson's disease Father    Hypothyroidism Daughter    Heart attack Paternal Grandfather    Alzheimer's disease Paternal Grandmother    Heart attack Maternal Grandfather    Crohn's disease Sister    Other Sister        was murdered   Crohn's disease Maternal Uncle    Colon cancer Neg Hx    Colon polyps Neg Hx    Social History   Socioeconomic History   Marital status: Single    Spouse name: WayPatrick JupiterNumber of children: 2   Years of education: GED   Highest education level: Not on file  Occupational History    Comment: disabled  Tobacco Use   Smoking status: Every Day    Packs/day: 0.25    Years: 30.00    Total pack years: 7.50    Types: Cigarettes   Smokeless tobacco: Never  Vaping Use   Vaping Use: Never used  Substance and Sexual Activity   Alcohol use: Not Currently    Comment: wine/beer a few times a week    Drug use: No   Sexual activity: Not Currently    Birth control/protection: Post-menopausal  Other Topics Concern   Not on file  Social History Narrative   Lives with husband   no caffiene   Social Determinants of Health   Financial Resource Strain: Low Risk  (01/13/2021)   Overall Financial Resource Strain (CARDIA)    Difficulty of Paying Living Expenses: Not hard at all  Food Insecurity: No Food Insecurity (01/13/2021)   Hunger Vital Sign    Worried About Running Out of Food in the Last Year: Never true  Ran  Out of Food in the Last Year: Never true  Transportation Needs: No Transportation Needs (01/13/2021)   PRAPARE - Hydrologist (Medical): No    Lack of Transportation (Non-Medical): No  Physical Activity: Insufficiently Active (01/13/2021)   Exercise Vital Sign    Days of Exercise per Week: 3 days    Minutes of Exercise per Session: 30 min  Stress: No Stress Concern Present (01/13/2021)   [place]    Feeling of Stress : Not at all  Social Connections: Moderately Integrated (01/13/2021)   Social Connection and Isolation Panel [NHANES]    Frequency of Communication with Friends and Family: More than three times a week    Frequency of Social Gatherings with Friends and Family: More than three times a week    Attends Religious Services: More than 4 times per year    Active Member of [employer] or Organizations: No    Attends Archivist Meetings: Never    Marital Status: Married    Tobacco Counseling Ready to quit: Not Answered Counseling given: Not Answered   Clinical Intake:  [name] [name] , Thank you for taking time to come for your Medicare Wellness Visit. I appreciate your ongoing commitment to your health goals. Please review the following plan we discussed and let me know if I can assist you in the future.   These are the goals we discussed:  Goals      Patient Stated     Would like to start feeling better      Patient Stated     Be able to do more housework.        This is a list of the screening recommended for you and due dates:  Health Maintenance  Topic Date Due   COVID-19 Vaccine (1) Never done   Pap Smear  04/02/2020   Mammogram  07/28/2022   Tetanus Vaccine  02/22/2027   Colon Cancer Screening  04/21/2028   Flu Shot  Completed   Hepatitis C Screening: USPSTF Recommendation to screen - Ages 18-79 yo.  Completed   HIV Screening  Completed   Zoster (Shingles)  Vaccine  Completed   HPV Vaccine  Aged Out      Diabetic?NO  Activities of Daily Living     No data to display           Patient Care Team: Lindell Spar, MD as PCP - General (Internal Medicine) Danie Binder, MD (Inactive) (Gastroenterology) Eloise Harman, DO as Consulting Physician (Internal Medicine)  Indicate any recent Medical Services you may have received from other than [facility] providers in the past year (date may be approximate).     Assessment:   This is a routine wellness examination for [name] [name].  Hearing/Vision screen No results found.  Dietary issues and exercise activities discussed:     Goals Addressed   None   Depression Screen    12/27/2021   11:12 AM 12/07/2021    8:40 AM 09/04/2021   10:29 AM 08/14/2021   10:09 AM 06/18/2021   11:07 AM 05/01/2021    1:00 PM 02/14/2021   11:16 AM  PHQ 2/9 Scores  PHQ - 2 Score 0 0 0 0 0 0 6  PHQ- 9 Score       17    Fall Risk    12/27/2021   11:12 AM 12/07/2021    8:40 AM 09/04/2021   10:29 AM  08/14/2021   10:09 AM 06/18/2021   11:07 AM  Fall Risk   Falls in the past year? 0 0 0 0 0  Number falls in past yr: 0 0 0 0 0  Injury with Fall? 0 0 0 0 0  Risk for fall due to : No Fall Risks No Fall Risks No Fall Risks No Fall Risks No Fall Risks  Follow up Falls evaluation completed Falls evaluation completed Falls evaluation completed Falls evaluation completed Falls evaluation completed    [place]:  Any stairs in or around the home? Yes  If so, are there any without handrails? Yes  Home free of loose throw rugs in walkways, pet beds, electrical cords, etc? Yes  Adequate lighting in your home to reduce risk of falls? Yes   ASSISTIVE DEVICES UTILIZED TO PREVENT FALLS:  Life alert? No  Use of a cane, walker or w/c? Yes  Grab bars in the bathroom? Yes  Shower chair or bench in shower? Yes  Elevated toilet seat or a handicapped toilet? Yes    Cognitive Function:     01/13/2021    9:17 AM  MMSE - Mini Mental State Exam  Not completed: Unable to complete        01/13/2021    9:17 AM  6CIT Screen  What Year? 0 points  What month? 0 points  What time? 0 points  Count back from 20 0 points  Months in reverse 0 points  Repeat phrase 2 points  Total Score 2 points    Immunizations Immunization History  Administered Date(s) Administered   DT (Pediatric) 02/17/2017   H1N1 03/22/2008   Influenza Whole 02/18/2006, 02/03/2007, 02/18/2008, 02/15/2010   Influenza,inj,Quad PF,6+ Mos 04/13/2013, 02/22/2014, 01/09/2016, 02/24/2018, 02/22/2019, 12/27/2021   Influenza-Unspecified 02/21/2017   Pneumococcal Polysaccharide-23 02/18/2006, 08/08/2015   Td 06/12/2009   Tdap 08/08/2015, 02/21/2017   Zoster Recombinat (Shingrix) 02/19/2017, 05/20/2017   Zoster, Live 08/08/2015    TDAP status: Up to date  Flu Vaccine status: Up to date  Pneumococcal vaccine status: Due, Education has been provided regarding the importance of this vaccine. Advised may receive this vaccine at local pharmacy or Health Dept. Aware to provide a copy of the vaccination record if obtained from local pharmacy or Health Dept. Verbalized acceptance and understanding.  Covid-19 vaccine status: Information provided on how to obtain vaccines.   Qualifies for Shingles Vaccine? Yes   Zostavax completed Yes   Shingrix Completed?: Yes  Screening Tests Health Maintenance  Topic Date Due   COVID-19 Vaccine (1) Never done   PAP SMEAR-Modifier  04/02/2020   MAMMOGRAM  07/28/2022   TETANUS/TDAP  02/22/2027   COLONOSCOPY (Pts 45-37yr Insurance coverage will need to be confirmed)  04/21/2028   INFLUENZA VACCINE  Completed   Hepatitis C Screening  Completed   HIV Screening  Completed   Zoster Vaccines- Shingrix  Completed   HPV VACCINES  Aged Out    Health Maintenance  Health Maintenance Due  Topic Date Due   COVID-19 Vaccine (1) Never done   PAP SMEAR-Modifier  04/02/2020     Colorectal cancer screening: Type of screening: Colonoscopy. Completed 04/21/18. Repeat every 10 years  Mammogram status: Completed 07/27/20. Repeat every year  Bone Density status: Completed 02/17/18. Results reflect: Bone density results: OSTEOPOROSIS. Repeat every 2 years.  Lung Cancer Screening: (Low Dose CT Chest recommended if Age 64-80years, 30 pack-year currently smoking OR have quit w/in 15years.) does not qualify.   Lung  Cancer Screening Referral: O  Additional Screening:  Hepatitis C Screening: does qualify; Completed 02/20/16  Vision Screening: Recommended annual ophthalmology exams for early detection of glaucoma and other disorders of the eye. Is the patient up to date with their annual eye exam?  No  Who is the provider or what is the name of the office in which the patient attends annual eye exams? N/a If pt is not established with a provider, would they like to be referred to a provider to establish care? No .   Dental Screening: Recommended annual dental exams for proper oral hygiene  Community Resource Referral / Chronic Care Management: CRR required this visit?  No   CCM required this visit?  No      Plan:     I have personally reviewed and noted the following in the patient's chart:   Medical and social history Use of alcohol, tobacco or illicit drugs  Current medications and supplements including opioid prescriptions. Patient is currently taking opioid prescriptions. Information provided to patient regarding non-opioid alternatives. Patient advised to discuss non-opioid treatment plan with their provider. Functional ability and status Nutritional status Physical activity Advanced directives List of other physicians Hospitalizations, surgeries, and ER visits in previous 12 months Vitals Screenings to include cognitive, depression, and falls Referrals and appointments  In addition, I have reviewed and discussed with patient certain preventive  protocols, quality metrics, and best practice recommendations. A written personalized care plan for preventive services as well as general preventive health recommendations were provided to patient.     Quentin Angst, [place]   01/14/2022

## 2022-01-14 NOTE — Patient Instructions (Signed)
  [name] [name] , Thank you for taking time to come for your Medicare Wellness Visit. I appreciate your ongoing commitment to your health goals. Please review the following plan we discussed and let me know if I can assist you in the future.   These are the goals we discussed:  Goals      Patient Stated     Would like to start feeling better      Patient Stated     Be able to do more housework.        This is a list of the screening recommended for you and due dates:  Health Maintenance  Topic Date Due   COVID-19 Vaccine (1) Never done   Pap Smear  04/02/2020   Mammogram  07/28/2022   Tetanus Vaccine  02/22/2027   Colon Cancer Screening  04/21/2028   Flu Shot  Completed   Hepatitis C Screening: USPSTF Recommendation to screen - Ages 18-79 yo.  Completed   HIV Screening  Completed   Zoster (Shingles) Vaccine  Completed   HPV Vaccine  Aged Out

## 2022-01-28 ENCOUNTER — Other Ambulatory Visit: Payer: Self-pay | Admitting: Internal Medicine

## 2022-01-28 DIAGNOSIS — J302 Other seasonal allergic rhinitis: Secondary | ICD-10-CM

## 2022-01-29 ENCOUNTER — Ambulatory Visit (HOSPITAL_COMMUNITY): Payer: Medicare Other | Attending: Internal Medicine | Admitting: Physical Therapy

## 2022-01-29 ENCOUNTER — Encounter (HOSPITAL_COMMUNITY): Payer: Self-pay | Admitting: Physical Therapy

## 2022-01-29 DIAGNOSIS — R262 Difficulty in walking, not elsewhere classified: Secondary | ICD-10-CM | POA: Diagnosis not present

## 2022-01-29 DIAGNOSIS — M6281 Muscle weakness (generalized): Secondary | ICD-10-CM | POA: Diagnosis not present

## 2022-01-29 DIAGNOSIS — M4316 Spondylolisthesis, lumbar region: Secondary | ICD-10-CM | POA: Diagnosis not present

## 2022-01-29 DIAGNOSIS — M545 Low back pain, unspecified: Secondary | ICD-10-CM | POA: Diagnosis not present

## 2022-01-29 NOTE — Therapy (Signed)
OUTPATIENT PHYSICAL THERAPY THORACOLUMBAR EVALUATION   Patient Name: [name] [name] MRN: [id number] DOB:12-04-1955, 66 y.o., female Today's Date: 01/29/2022   PT End of Session - 01/29/22 1001     Visit Number 1    Number of Visits 12    Date for PT Re-Evaluation 03/12/22    Authorization Type [employer] Medicare (Medicaid [place] secondary)    Authorization Time Period (No auth, no LV)    Progress Note Due on Visit 10             Past Medical History:  Diagnosis Date   Allergic rhinitis    Anastomotic ulcer JAN 2009   Anxiety    Arthritis    Asthma    BMI (body mass index) 20.0-29.9 2009 121 lbs   Concussion    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 PFTS   Crohn disease ([place])    CTS (carpal tunnel syndrome)    Diarrhea MULITIFACTORIAL   IBS, LACTOSE INTOLERANCE, SBBO, BILE-SALT   Elevated liver enzymes 2009: BMI 24 ?Etoh ALT 94, AST 51,  NEG ANA, qIGs& ASMA   MAY 2012 AST 52 ALT 38   Esophageal stricture 2009   GERD (gastroesophageal reflux disease)    Hemorrhoid    Hyperlipemia    Hypertension    Inflammatory bowel disease 2006 CD   SURGICAL REMISSION   LBP (low back pain)    Mental disorder    Migraine    Osteoporosis    PONV (postoperative nausea and vomiting)    Shortness of breath    exertion and humidity   Shoulder pain    right   Sleep apnea    Stop Bang Cock score of 4   Past Surgical History:  Procedure Laterality Date   BACK SURGERY     BACK SURGERY  07/16/2018   BILATERAL SALPINGOOPHORECTOMY     BIOPSY  12/24/2011   Procedure: BIOPSY;  Surgeon: Danie Binder, MD;  Location: [facility];  Service: Endoscopy;;  Gastric Biopsies   BIOPSY N/A 06/30/2012   Procedure: BIOPSY;  Surgeon: Danie Binder, MD;  Location: [facility];  Service: Endoscopy;  Laterality: N/A;  Gastric and Esophageal Biopsies   CARPAL TUNNEL RELEASE  LEFT   CATARACT EXTRACTION W/PHACO Right 07/30/2016   Procedure: CATARACT EXTRACTION PHACO AND INTRAOCULAR LENS PLACEMENT ([place]);  Surgeon: Rutherford Guys,  MD;  Location: [facility];  Service: Ophthalmology;  Laterality: Right;  CDE: 5.45   CATARACT EXTRACTION W/PHACO Left 08/13/2016   Procedure: CATARACT EXTRACTION PHACO AND INTRAOCULAR LENS PLACEMENT ([facility]);  Surgeon: Rutherford Guys, MD;  Location: [facility];  Service: Ophthalmology;  Laterality: Left;  CDE: 5.59   COLON SURGERY     COLONOSCOPY  JAN 2009 DIARRHEA, ABD PAIN, BRBPR   ANASTOMOTIC ULCER(3MM), IH MB:WGYKZL ULCER, NL COLON bX   COLONOSCOPY WITH PROPOFOL N/A 04/21/2018   examined portion of the ileum normal, two 3-5 mm polyps, diverticulosis in the rectosigmoid and sigmoid colon, external and internal hemorrhoids, significant looping of the colon.  Pathology with tubular adenomas.  Recommendations to follow high-fiber diet and repeat colonoscopy in 5 to 10 years with peds colonoscope.   DILATION AND CURETTAGE OF UTERUS     ESOPHAGEAL DILATION  12/24/2011   SLF: A stricture was found in the distal esophagus/ Moderate gastritis/ MILD Duodenitis   ESOPHAGOGASTRODUODENOSCOPY  02/07/09   mild gastritis   ESOPHAGOGASTRODUODENOSCOPY (EGD) WITH PROPOFOL N/A 06/30/2012   SLF: 1. No definite stricture appreciated 2. small hiatal hernia 3. Gastritis   ESOPHAGOGASTRODUODENOSCOPY (EGD)  WITH PROPOFOL N/A 02/20/2016   Procedure: ESOPHAGOGASTRODUODENOSCOPY (EGD) WITH PROPOFOL;  Surgeon: Danie Binder, MD;  [facility];  Service: Endoscopy;  Laterality: N/A;  930   ESOPHAGOGASTRODUODENOSCOPY (EGD) WITH PROPOFOL N/A 11/24/2018   benign-appearing esophageal peptic stricture s/p dilation, small hiatal hernia, moderate erosive gastritis.    EYE SURGERY     gum flap Bilateral    HEMICOLECTOMY  RIGHT 2006   HERNIA REPAIR     LUMBAR DISC SURGERY     POLYPECTOMY  04/21/2018   Procedure: POLYPECTOMY;  Surgeon: Danie Binder, MD;  [facility];  Service: Endoscopy;;  (colon)   SAVORY DILATION N/A 06/30/2012   Procedure: SAVORY DILATION;  Surgeon: Danie Binder, MD;  Location: [facility];  Service:  Endoscopy;  Laterality: N/A;  12.81m , 142m 156m SAVORY DILATION N/A 02/20/2016   Procedure: SAVORY DILATION;  Surgeon: SanDanie BinderD;  [facility];  Service: Endoscopy;  Laterality: N/A;   SAVORY DILATION N/A 11/24/2018   Procedure: SAVORY DILATION;  Surgeon: FieDanie BinderD;  [facility];  Service: Endoscopy;  Laterality: N/A;   UPPER GASTROINTESTINAL ENDOSCOPY  JAN 2009 ABD PAIN   GASTRITIS, ESO RING   UPPER GASTROINTESTINAL ENDOSCOPY  OCT 2010 ABD PAIN, DYSPHAGIA   DIL 15MM, GASTRITIS, NL DUODENUM   UPPER GASTROINTESTINAL ENDOSCOPY  OCT 2010 DYSPHAGIA   DIL 17 MM, GASTRITIS, NL DUODENUM   vocal cord surgery  Sep 20, 2010   precancerous areas removed   wisdom tooth extraction Right    pin placement due to broken jaw   Patient Active Problem List   Diagnosis Date Noted   Encounter for general adult medical examination with abnormal findings 12/27/2021   Rash 08/14/2021   Hypothyroidism 12/13/2020   S/P lumbar fusion 10/19/2019   Seasonal allergies 09/01/2018   Chronic low back pain 04/07/2018   Perianal cyst 02/24/2018   Alcohol abuse 12/23/2017   Tobacco abuse 06/25/2017   Osteoarthritis 12/27/2014   Allergic dermatitis eyelid 12/27/2014   Abnormal CT scan, chest 01/12/2014   Polycythemia 09/08/2012   Vertigo 01/22/2012   Neck pain 01/22/2012   Chronic pain syndrome 12/27/2011   Diarrhea, episodic 12/27/2011   Edema of larynx 04/22/2011   Dysphagia 03/14/2011   DEPRESSION 04/24/2010   Chronic bronchitis ([place]9/16/2011   Osteoporosis 11/11/2008   Spondylolisthesis, lumbar region 07/28/2008   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/2007   DEGENERATIVE DISBelvaSEASE, CERVICAL SPINE 06/29/2007   Hyperlipidemia 02/03/2007   VARCIES, SITE NEC 09/30/2006   Anxiety state 02/27/2006   Essential hypertension 02/27/2006   GERD 02/27/2006   Crohn's disease of small intestine with complication (HCCCody0/26/20/3559 PCP: RutIhor DowMD  REFERRING PROVIDER: RutIhor DowD  REFERRING DIAG: M43.16 (ICD-10-CM) - Spondylolisthesis, lumbar region   Rationale for Evaluation and Treatment Rehabilitation  THERAPY DIAG:  Low back pain, unspecified back pain laterality, unspecified chronicity, unspecified whether sciatica present  Muscle weakness (generalized)  ONSET DATE: June 2023  SUBJECTIVE:  SUBJECTIVE STATEMENT: Patient reports chronic back pain; a couple of lumbar surgeries reported including fusion. Pain improved noticeably for a while after surgery and following some rehab. States in June her symptoms got worse after being bitten by a tick on her right shin. Had DVT ruled out reportedly. States she has been unable to continue with her exercise routine due to the leg pain. Denies proximal symptoms in thigh or glute. Muscle relaxer prescription was helpful.  PERTINENT HISTORY:  Chron's disease  PAIN:  Are you having pain? Yes: NPRS scale: 8/10 Pain location: back and RLE Pain description: sore Aggravating factors: sweeping, too much activity Relieving factors: medication   PRECAUTIONS: None  WEIGHT BEARING RESTRICTIONS No  FALLS:  Has patient fallen in last 6 months? No  LIVING ENVIRONMENT: Lives with: lives with their family and lives with their spouse Lives in: House/apartment Stairs: Yes: External: 4 steps; on right going up Has following equipment at home: Single point cane walking stick  OCCUPATION: Does not work  PLOF: Independent  PATIENT GOALS Be able to wash her windows, clean house with less pain. Do some yard work.   OBJECTIVE:   DIAGNOSTIC FINDINGS:  IMPRESSION: 1. Status post L4-L5 fusion. No spinal canal stenosis. Susceptibility artifact from hardware obscures the neural foramina. 2. Mild left  neural foraminal narrowing at L3-L4, and possibly at L5-S1.   On: 10/12/202  PATIENT SURVEYS:  FOTO 29%  SCREENING FOR RED FLAGS: Bowel or bladder incontinence: No Spinal tumors: No Cauda equina syndrome: No Compression fracture: No Abdominal aneurysm: No  COGNITION:  Overall cognitive status: Within functional limits for tasks assessed     SENSATION: Light touch: Impaired  Rt foot, reports hx of neuropathy    POSTURE: rounded shoulders, forward head, and increased thoracic kyphosis   LUMBAR ROM:   Active  A/PROM  eval  Flexion Mid shin  Extension Limited 50%  Right lateral flexion Top of patella  Left lateral flexion Past mid jointline  Right rotation wfl  Left rotation wfl   (Blank rows = not tested)   LOWER EXTREMITY MMT:    MMT (Low effort) Right eval Left eval  Hip flexion 4 4  Hip extension 2 2  Hip abduction 4- 4-  Hip adduction 4 4  Hip internal rotation    Hip external rotation    Knee flexion 3 3  Knee extension 3+ 5  Ankle dorsiflexion 4- 4+  Ankle plantarflexion    Ankle inversion    Ankle eversion     (Blank rows = not tested)  LUMBAR SPECIAL TESTS:  Slump test: Negative  FUNCTIONAL TESTS:  5 times sit to stand: 17.5 sec 2 minute walk test: 306 feet no AD  GAIT: Distance walked: 306 feet no AD Assistive device utilized: None Level of assistance: Complete Independence Comments: Very erratic; no consistent gait pattern, changing speed, veering, frequently, wide and narrow BOS but inconsistent throughout distance. Not ataxic.    TODAY'S TREATMENT  EVAL MMT, ROM 2MWT 5x Sit to stand   PATIENT EDUCATION:  Education details: findings, safety awareness, POC, PT role Person educated: Patient Education method: Explanation Education comprehension: verbalized understanding   HOME EXERCISE PROGRAM:   ASSESSMENT:  CLINICAL IMPRESSION: Patient is a 66 y.o. female who was seen today for physical therapy evaluation and  treatment for low back pain. Patient presents with deficits including reduced strength, limited range of motion, decreased endurance, limited activity tolerance, abnormalities of gait, impaired balance, and pain, contributing to impaired functional mobility with ADLs  and IADLs. Patient is currently restricted in ADLs as indicated by objective and subjective functional outcome measures, as well as reported history and objective measures taken during this exam. Patient will benefit from skilled physical therapy intervention in order to improve function and reduce the impairments listed above.     OBJECTIVE IMPAIRMENTS Abnormal gait, decreased activity tolerance, decreased balance, decreased endurance, decreased knowledge of condition, decreased knowledge of use of DME, decreased mobility, difficulty walking, decreased ROM, decreased strength, decreased safety awareness, hypomobility, impaired flexibility, impaired sensation, improper body mechanics, postural dysfunction, and pain.   ACTIVITY LIMITATIONS carrying, lifting, bending, sitting, standing, squatting, stairs, and locomotion level  PARTICIPATION LIMITATIONS: cleaning, laundry, driving, shopping, community activity, and yard work  PERSONAL FACTORS Age, Behavior pattern, and Time since onset of injury/illness/exacerbation are also affecting patient's functional outcome.   REHAB POTENTIAL: Good  CLINICAL DECISION MAKING: Stable/uncomplicated  EVALUATION COMPLEXITY: Low   GOALS: Goals reviewed with patient? Yes  SHORT TERM GOALS: Target date: 02/19/22  Patient will be independent with initial HEP and self-management strategies to improve functional outcomes Baseline: Initiated Goal status: INITIAL    LONG TERM GOALS: Target date: 03/12/22  Patient will be independent with advanced HEP and self-management strategies to improve functional outcomes Baseline:  Goal status: INITIAL  2.  Patient will improve FOTO score to predicted value  of 45% to indicate improvement in functional outcomes Baseline: 29% Goal status: INITIAL  3.  Patient will report reduction of back pain to <3/10 for improved quality of life and ability to perform ADLs  Baseline: 8/10 Goal status: INITIAL  4. Patient will have equal to or > 4+/5 MMT throughout BIL LEs to improve ability to perform functional mobility, stair ambulation and ADLs.  Baseline: See above Goal status: INITIAL    PLAN: PT FREQUENCY: 2x/week  PT DURATION: 6 weeks  PLANNED INTERVENTIONS: Therapeutic exercises, Therapeutic activity, Neuromuscular re-education, Balance training, Gait training, Patient/Family education, Self Care, Joint mobilization, Joint manipulation, Stair training, DME instructions, Dry Needling, Electrical stimulation, Spinal manipulation, Spinal mobilization, Cryotherapy, Moist heat, Taping, Traction, Ultrasound, Ionotophoresis 39m/ml Dexamethasone, Manual therapy, and Re-evaluation.  PLAN FOR NEXT SESSION: Core and LE strengthening, gait and balance training as needed. Consider further special tests (limited time to assess today).   LCandie Mile PT, DPT Physical Therapist Acute [facility] 01/29/2022, 10:03 AM

## 2022-02-05 ENCOUNTER — Encounter (HOSPITAL_COMMUNITY): Payer: Medicare Other | Admitting: Physical Therapy

## 2022-02-05 DIAGNOSIS — M5442 Lumbago with sciatica, left side: Secondary | ICD-10-CM | POA: Diagnosis not present

## 2022-02-05 DIAGNOSIS — G894 Chronic pain syndrome: Secondary | ICD-10-CM | POA: Diagnosis not present

## 2022-02-05 DIAGNOSIS — M4326 Fusion of spine, lumbar region: Secondary | ICD-10-CM | POA: Diagnosis not present

## 2022-02-05 DIAGNOSIS — Z79899 Other long term (current) drug therapy: Secondary | ICD-10-CM | POA: Diagnosis not present

## 2022-02-05 DIAGNOSIS — M549 Dorsalgia, unspecified: Secondary | ICD-10-CM | POA: Diagnosis not present

## 2022-02-07 ENCOUNTER — Encounter (HOSPITAL_COMMUNITY): Payer: Medicare Other | Admitting: Physical Therapy

## 2022-02-12 ENCOUNTER — Ambulatory Visit (HOSPITAL_COMMUNITY): Payer: Medicare Other | Attending: Internal Medicine

## 2022-02-12 DIAGNOSIS — R262 Difficulty in walking, not elsewhere classified: Secondary | ICD-10-CM | POA: Insufficient documentation

## 2022-02-12 DIAGNOSIS — M6281 Muscle weakness (generalized): Secondary | ICD-10-CM | POA: Insufficient documentation

## 2022-02-12 DIAGNOSIS — M545 Low back pain, unspecified: Secondary | ICD-10-CM | POA: Insufficient documentation

## 2022-02-12 DIAGNOSIS — M5416 Radiculopathy, lumbar region: Secondary | ICD-10-CM | POA: Insufficient documentation

## 2022-02-12 NOTE — Therapy (Signed)
OUTPATIENT PHYSICAL THERAPY THORACOLUMBAR TREATMENT   Patient Name: [name] [name] MRN: [id number] DOB:07/18/1955, 66 y.o., female Today's Date: 02/12/2022   PT End of Session - 02/12/22 1137     Visit Number 2    Number of Visits 12    Date for PT Re-Evaluation 03/12/22    Authorization Type [employer] Medicare (Medicaid [place] secondary)    Authorization Time Period (No auth, no LV)    Progress Note Due on Visit 10    PT Start Time 1123   late check in   PT Stop Time 1202    PT Time Calculation (min) 39 min    Activity Tolerance Patient tolerated treatment well    Behavior During Therapy [facility] for tasks assessed/performed              Past Medical History:  Diagnosis Date   Allergic rhinitis    Anastomotic ulcer JAN 2009   Anxiety    Arthritis    Asthma    BMI (body mass index) 20.0-29.9 2009 121 lbs   Concussion    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 PFTS   Crohn disease ([place])    CTS (carpal tunnel syndrome)    Diarrhea MULITIFACTORIAL   IBS, LACTOSE INTOLERANCE, SBBO, BILE-SALT   Elevated liver enzymes 2009: BMI 24 ?Etoh ALT 94, AST 51,  NEG ANA, qIGs& ASMA   MAY 2012 AST 52 ALT 38   Esophageal stricture 2009   GERD (gastroesophageal reflux disease)    Hemorrhoid    Hyperlipemia    Hypertension    Inflammatory bowel disease 2006 CD   SURGICAL REMISSION   LBP (low back pain)    Mental disorder    Migraine    Osteoporosis    PONV (postoperative nausea and vomiting)    Shortness of breath    exertion and humidity   Shoulder pain    right   Sleep apnea    Stop Bang Cock score of 4   Past Surgical History:  Procedure Laterality Date   BACK SURGERY     BACK SURGERY  07/16/2018   BILATERAL SALPINGOOPHORECTOMY     BIOPSY  12/24/2011   Procedure: BIOPSY;  Surgeon: Danie Binder, MD;  Location: [facility];  Service: Endoscopy;;  Gastric Biopsies   BIOPSY N/A 06/30/2012   Procedure: BIOPSY;  Surgeon: Danie Binder, MD;  Location: [facility];  Service: Endoscopy;  Laterality:  N/A;  Gastric and Esophageal Biopsies   CARPAL TUNNEL RELEASE  LEFT   CATARACT EXTRACTION W/PHACO Right 07/30/2016   Procedure: CATARACT EXTRACTION PHACO AND INTRAOCULAR LENS PLACEMENT ([place]);  Surgeon: Rutherford Guys, MD;  Location: [facility];  Service: Ophthalmology;  Laterality: Right;  CDE: 5.45   CATARACT EXTRACTION W/PHACO Left 08/13/2016   Procedure: CATARACT EXTRACTION PHACO AND INTRAOCULAR LENS PLACEMENT (IOC);  Surgeon: Rutherford Guys, MD;  Location: [facility];  Service: Ophthalmology;  Laterality: Left;  CDE: 5.59   COLON SURGERY     COLONOSCOPY  JAN 2009 DIARRHEA, ABD PAIN, BRBPR   ANASTOMOTIC ULCER(3MM), IH JF:HLKTGY ULCER, NL COLON bX   COLONOSCOPY WITH PROPOFOL N/A 04/21/2018   examined portion of the ileum normal, two 3-5 mm polyps, diverticulosis in the rectosigmoid and sigmoid colon, external and internal hemorrhoids, significant looping of the colon.  Pathology with tubular adenomas.  Recommendations to follow high-fiber diet and repeat colonoscopy in 5 to 10 years with peds colonoscope.   DILATION AND CURETTAGE OF UTERUS     ESOPHAGEAL DILATION  12/24/2011   SLF:  A stricture was found in the distal esophagus/ Moderate gastritis/ MILD Duodenitis   ESOPHAGOGASTRODUODENOSCOPY  02/07/09   mild gastritis   ESOPHAGOGASTRODUODENOSCOPY (EGD) WITH PROPOFOL N/A 06/30/2012   SLF: 1. No definite stricture appreciated 2. small hiatal hernia 3. Gastritis   ESOPHAGOGASTRODUODENOSCOPY (EGD) WITH PROPOFOL N/A 02/20/2016   Procedure: ESOPHAGOGASTRODUODENOSCOPY (EGD) WITH PROPOFOL;  Surgeon: Danie Binder, MD;  [facility];  Service: Endoscopy;  Laterality: N/A;  930   ESOPHAGOGASTRODUODENOSCOPY (EGD) WITH PROPOFOL N/A 11/24/2018   benign-appearing esophageal peptic stricture s/p dilation, small hiatal hernia, moderate erosive gastritis.    EYE SURGERY     gum flap Bilateral    HEMICOLECTOMY  RIGHT 2006   HERNIA REPAIR     LUMBAR DISC SURGERY     POLYPECTOMY  04/21/2018   Procedure:  POLYPECTOMY;  Surgeon: Danie Binder, MD;  [facility];  Service: Endoscopy;;  (colon)   SAVORY DILATION N/A 06/30/2012   Procedure: SAVORY DILATION;  Surgeon: Danie Binder, MD;  Location: [facility];  Service: Endoscopy;  Laterality: N/A;  12.57m , 122m 1543m SAVORY DILATION N/A 02/20/2016   Procedure: SAVORY DILATION;  Surgeon: SanDanie BinderD;  [facility];  Service: Endoscopy;  Laterality: N/A;   SAVORY DILATION N/A 11/24/2018   Procedure: SAVORY DILATION;  Surgeon: FieDanie BinderD;  [facility];  Service: Endoscopy;  Laterality: N/A;   UPPER GASTROINTESTINAL ENDOSCOPY  JAN 2009 ABD PAIN   GASTRITIS, ESO RING   UPPER GASTROINTESTINAL ENDOSCOPY  OCT 2010 ABD PAIN, DYSPHAGIA   DIL 15MM, GASTRITIS, NL DUODENUM   UPPER GASTROINTESTINAL ENDOSCOPY  OCT 2010 DYSPHAGIA   DIL 17 MM, GASTRITIS, NL DUODENUM   vocal cord surgery  Sep 20, 2010   precancerous areas removed   wisdom tooth extraction Right    pin placement due to broken jaw   Patient Active Problem List   Diagnosis Date Noted   Encounter for general adult medical examination with abnormal findings 12/27/2021   Rash 08/14/2021   Hypothyroidism 12/13/2020   S/P lumbar fusion 10/19/2019   Seasonal allergies 09/01/2018   Chronic low back pain 04/07/2018   Perianal cyst 02/24/2018   Alcohol abuse 12/23/2017   Tobacco abuse 06/25/2017   Osteoarthritis 12/27/2014   Allergic dermatitis eyelid 12/27/2014   Abnormal CT scan, chest 01/12/2014   Polycythemia 09/08/2012   Vertigo 01/22/2012   Neck pain 01/22/2012   Chronic pain syndrome 12/27/2011   Diarrhea, episodic 12/27/2011   Edema of larynx 04/22/2011   Dysphagia 03/14/2011   DEPRESSION 04/24/2010   Chronic bronchitis ([facility]CC[place]9/16/2011   Osteoporosis 11/11/2008   Spondylolisthesis, lumbar region 07/28/2008   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/2007   DEGENERATIVE DISColerainSEASE, CERVICAL SPINE 06/29/2007   Hyperlipidemia  02/03/2007   VARCIES, SITE NEC 09/30/2006   Anxiety state 02/27/2006   Essential hypertension 02/27/2006   GERD 02/27/2006   Crohn's disease of small intestine with complication ([id number] PCP: RutIhor DowD  REFERRING PROVIDER: RutIhor DowD  REFERRING DIAG: M43.16 (ICD-10-CM) - Spondylolisthesis, lumbar region   Rationale for Evaluation and Treatment Rehabilitation  THERAPY DIAG:  Low back pain, unspecified back pain laterality, unspecified chronicity, unspecified whether sciatica present  Muscle weakness (generalized)  Difficulty in walking, not elsewhere classified  Radiculopathy, lumbar region  ONSET DATE: June 2023  SUBJECTIVE:  SUBJECTIVE STATEMENT: Patient reports she is "fair" today; continues with right leg and back pain; some tightness in the right leg; took a 1/2 pain pill before therapy today PERTINENT HISTORY:  Chron's disease  PAIN:  Are you having pain? Yes: NPRS scale: 4-5/10 Pain location: back and RLE Pain description: sore Aggravating factors: sweeping, too much activity Relieving factors: medication   PRECAUTIONS: None  WEIGHT BEARING RESTRICTIONS No  FALLS:  Has patient fallen in last 6 months? No  LIVING ENVIRONMENT: Lives with: lives with their family and lives with their spouse Lives in: House/apartment Stairs: Yes: External: 4 steps; on right going up Has following equipment at home: Single point cane walking stick  OCCUPATION: Does not work  PLOF: Independent  PATIENT GOALS Be able to wash her windows, clean house with less pain. Do some yard work.   OBJECTIVE:   DIAGNOSTIC FINDINGS:  IMPRESSION: 1. Status post L4-L5 fusion. No spinal canal stenosis. Susceptibility artifact from hardware obscures the neural foramina. 2. Mild  left neural foraminal narrowing at L3-L4, and possibly at L5-S1.   On: 10/12/202  PATIENT SURVEYS:  FOTO 29%  SCREENING FOR RED FLAGS: Bowel or bladder incontinence: No Spinal tumors: No Cauda equina syndrome: No Compression fracture: No Abdominal aneurysm: No  COGNITION:  Overall cognitive status: Within functional limits for tasks assessed     SENSATION: Light touch: Impaired  Rt foot, reports hx of neuropathy    POSTURE: rounded shoulders, forward head, and increased thoracic kyphosis   LUMBAR ROM:   Active  A/PROM  eval  Flexion Mid shin  Extension Limited 50%  Right lateral flexion Top of patella  Left lateral flexion Past mid jointline  Right rotation wfl  Left rotation wfl   (Blank rows = not tested)   LOWER EXTREMITY MMT:    MMT (Low effort) Right eval Left eval  Hip flexion 4 4  Hip extension 2 2  Hip abduction 4- 4-  Hip adduction 4 4  Hip internal rotation    Hip external rotation    Knee flexion 3 3  Knee extension 3+ 5  Ankle dorsiflexion 4- 4+  Ankle plantarflexion    Ankle inversion    Ankle eversion     (Blank rows = not tested)  LUMBAR SPECIAL TESTS:  Slump test: Negative  FUNCTIONAL TESTS:  5 times sit to stand: 17.5 sec 2 minute walk test: 306 feet no AD  GAIT: Distance walked: 306 feet no AD Assistive device utilized: None Level of assistance: Complete Independence Comments: Very erratic; no consistent gait pattern, changing speed, veering, frequently, wide and narrow BOS but inconsistent throughout distance. Not ataxic.    TODAY'S TREATMENT  02/12/22 Review of goals  Sidelying: Clam x 10 each  Supine: LTR x 10 Transverse abdominus 5" hold x 10 TA contraction with marching x 10 Bridge x 8 (painful)  Sitting: Heel/toe raises x 10 Hamstring stretch with strap 5 x 10" (painful)    EVAL MMT, ROM 2MWT 5x Sit to stand   PATIENT EDUCATION:  Education details: findings, safety awareness, POC, PT  role Person educated: Patient Education method: Explanation Education comprehension: verbalized understanding   HOME EXERCISE PROGRAM: Access Code: [email or web address] URL: [email or web address] Date: 02/12/2022 Prepared by: [facility]  Exercises - Clamshell  - 1 x daily - 7 x weekly - 1 sets - 10 reps - Supine Lower Trunk Rotation  - 1 x daily - 7 x weekly - 1 sets - 10 reps - Supine Transversus Abdominis Bracing -  Hands on Stomach  - 1 x daily - 7 x weekly - 1 sets - 10 reps - 5 sec hold - Supine Bridge  - 1 x daily - 7 x weekly - 1 sets - 10 reps - Supine March  - 1 x daily - 7 x weekly - 1 sets - 10 reps  ASSESSMENT:  CLINICAL IMPRESSION: Today's session started with review of goals. Patient verbalizes understanding and agreement with set rehab goals. Initiated HEP; patient needs physical and verbal cuing for transverse abdominus bracing. Patient needs occasional redirect to task; treated with low light due to patient eye sensitivity to light. Patient will benefit from skilled physical therapy intervention in order to improve function and reduce the impairments listed above.     OBJECTIVE IMPAIRMENTS Abnormal gait, decreased activity tolerance, decreased balance, decreased endurance, decreased knowledge of condition, decreased knowledge of use of DME, decreased mobility, difficulty walking, decreased ROM, decreased strength, decreased safety awareness, hypomobility, impaired flexibility, impaired sensation, improper body mechanics, postural dysfunction, and pain.   ACTIVITY LIMITATIONS carrying, lifting, bending, sitting, standing, squatting, stairs, and locomotion level  PARTICIPATION LIMITATIONS: cleaning, laundry, driving, shopping, community activity, and yard work  PERSONAL FACTORS Age, Behavior pattern, and Time since onset of injury/illness/exacerbation are also affecting patient's functional outcome.   REHAB POTENTIAL: Good  CLINICAL DECISION MAKING:  Stable/uncomplicated  EVALUATION COMPLEXITY: Low   GOALS: Goals reviewed with patient? Yes  SHORT TERM GOALS: Target date: 02/19/22  Patient will be independent with initial HEP and self-management strategies to improve functional outcomes Baseline: Initiated Goal status: IN PROGRESS    LONG TERM GOALS: Target date: 03/12/22  Patient will be independent with advanced HEP and self-management strategies to improve functional outcomes Baseline:  Goal status: IN PROGRESS  2.  Patient will improve FOTO score to predicted value of 45% to indicate improvement in functional outcomes Baseline: 29% Goal status: IN PROGRESS  3.  Patient will report reduction of back pain to <3/10 for improved quality of life and ability to perform ADLs  Baseline: 8/10 Goal status: IN PROGRESS  4. Patient will have equal to or > 4+/5 MMT throughout BIL LEs to improve ability to perform functional mobility, stair ambulation and ADLs.  Baseline: See above Goal status: IN PROGRESS    PLAN: PT FREQUENCY: 2x/week  PT DURATION: 6 weeks  PLANNED INTERVENTIONS: Therapeutic exercises, Therapeutic activity, Neuromuscular re-education, Balance training, Gait training, Patient/Family education, Self Care, Joint mobilization, Joint manipulation, Stair training, DME instructions, Dry Needling, Electrical stimulation, Spinal manipulation, Spinal mobilization, Cryotherapy, Moist heat, Taping, Traction, Ultrasound, Ionotophoresis 35m/ml Dexamethasone, Manual therapy, and Re-evaluation.  PLAN FOR NEXT SESSION: Core and LE strengthening, gait and balance training as needed. Add sit to stand , scapular retractions  12:02 PM, 02/12/22 Aldena Worm Small Janiesha Diehl MPT [place] [facility] #[phone]

## 2022-02-13 ENCOUNTER — Ambulatory Visit (HOSPITAL_COMMUNITY): Payer: Medicare Other | Admitting: Physical Therapy

## 2022-02-13 DIAGNOSIS — M6281 Muscle weakness (generalized): Secondary | ICD-10-CM

## 2022-02-13 DIAGNOSIS — R262 Difficulty in walking, not elsewhere classified: Secondary | ICD-10-CM | POA: Diagnosis not present

## 2022-02-13 DIAGNOSIS — M5416 Radiculopathy, lumbar region: Secondary | ICD-10-CM | POA: Diagnosis not present

## 2022-02-13 DIAGNOSIS — M545 Low back pain, unspecified: Secondary | ICD-10-CM

## 2022-02-13 NOTE — Therapy (Signed)
OUTPATIENT PHYSICAL THERAPY THORACOLUMBAR TREATMENT   Patient Name: [name] [name] MRN: [id number] DOB:14-Sep-1955, 66 y.o., female Today's Date: 02/13/2022   PT End of Session - 02/13/22 1130     Visit Number 3    Number of Visits 12    Date for PT Re-Evaluation 03/12/22    Authorization Type [employer] Medicare (Medicaid [place] secondary)    Authorization Time Period (No auth, no LV)    Progress Note Due on Visit 10    PT Start Time 1133   late arrival, then needed restroom   PT Stop Time 1158    PT Time Calculation (min) 25 min    Activity Tolerance Patient tolerated treatment well    Behavior During Therapy [facility] for tasks assessed/performed              Past Medical History:  Diagnosis Date   Allergic rhinitis    Anastomotic ulcer JAN 2009   Anxiety    Arthritis    Asthma    BMI (body mass index) 20.0-29.9 2009 121 lbs   Concussion    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 PFTS   Crohn disease ([place])    CTS (carpal tunnel syndrome)    Diarrhea MULITIFACTORIAL   IBS, LACTOSE INTOLERANCE, SBBO, BILE-SALT   Elevated liver enzymes 2009: BMI 24 ?Etoh ALT 94, AST 51,  NEG ANA, qIGs& ASMA   MAY 2012 AST 52 ALT 38   Esophageal stricture 2009   GERD (gastroesophageal reflux disease)    Hemorrhoid    Hyperlipemia    Hypertension    Inflammatory bowel disease 2006 CD   SURGICAL REMISSION   LBP (low back pain)    Mental disorder    Migraine    Osteoporosis    PONV (postoperative nausea and vomiting)    Shortness of breath    exertion and humidity   Shoulder pain    right   Sleep apnea    Stop Bang Cock score of 4   Past Surgical History:  Procedure Laterality Date   BACK SURGERY     BACK SURGERY  07/16/2018   BILATERAL SALPINGOOPHORECTOMY     BIOPSY  12/24/2011   Procedure: BIOPSY;  Surgeon: Danie Binder, MD;  Location: [facility];  Service: Endoscopy;;  Gastric Biopsies   BIOPSY N/A 06/30/2012   Procedure: BIOPSY;  Surgeon: Danie Binder, MD;  Location: [facility];  Service:  Endoscopy;  Laterality: N/A;  Gastric and Esophageal Biopsies   CARPAL TUNNEL RELEASE  LEFT   CATARACT EXTRACTION W/PHACO Right 07/30/2016   Procedure: CATARACT EXTRACTION PHACO AND INTRAOCULAR LENS PLACEMENT ([place]);  Surgeon: Rutherford Guys, MD;  Location: [facility];  Service: Ophthalmology;  Laterality: Right;  CDE: 5.45   CATARACT EXTRACTION W/PHACO Left 08/13/2016   Procedure: CATARACT EXTRACTION PHACO AND INTRAOCULAR LENS PLACEMENT (I[facility]);  Surgeon: Rutherford Guys, MD;  Location: [facility];  Service: Ophthalmology;  Laterality: Left;  CDE: 5.59   COLON SURGERY     COLONOSCOPY  JAN 2009 DIARRHEA, ABD PAIN, BRBPR   ANASTOMOTIC ULCER(3MM), IH IR:SWNIOE ULCER, NL COLON bX   COLONOSCOPY WITH PROPOFOL N/A 04/21/2018   examined portion of the ileum normal, two 3-5 mm polyps, diverticulosis in the rectosigmoid and sigmoid colon, external and internal hemorrhoids, significant looping of the colon.  Pathology with tubular adenomas.  Recommendations to follow high-fiber diet and repeat colonoscopy in 5 to 10 years with peds colonoscope.   DILATION AND CURETTAGE OF UTERUS     ESOPHAGEAL DILATION  12/24/2011  SLF: A stricture was found in the distal esophagus/ Moderate gastritis/ MILD Duodenitis   ESOPHAGOGASTRODUODENOSCOPY  02/07/09   mild gastritis   ESOPHAGOGASTRODUODENOSCOPY (EGD) WITH PROPOFOL N/A 06/30/2012   SLF: 1. No definite stricture appreciated 2. small hiatal hernia 3. Gastritis   ESOPHAGOGASTRODUODENOSCOPY (EGD) WITH PROPOFOL N/A 02/20/2016   Procedure: ESOPHAGOGASTRODUODENOSCOPY (EGD) WITH PROPOFOL;  Surgeon: Danie Binder, MD;  [facility];  Service: Endoscopy;  Laterality: N/A;  930   ESOPHAGOGASTRODUODENOSCOPY (EGD) WITH PROPOFOL N/A 11/24/2018   benign-appearing esophageal peptic stricture s/p dilation, small hiatal hernia, moderate erosive gastritis.    EYE SURGERY     gum flap Bilateral    HEMICOLECTOMY  RIGHT 2006   HERNIA REPAIR     LUMBAR DISC SURGERY     POLYPECTOMY   04/21/2018   Procedure: POLYPECTOMY;  Surgeon: Danie Binder, MD;  [facility];  Service: Endoscopy;;  (colon)   SAVORY DILATION N/A 06/30/2012   Procedure: SAVORY DILATION;  Surgeon: Danie Binder, MD;  Location: [facility];  Service: Endoscopy;  Laterality: N/A;  12.64m , 187m 1564m SAVORY DILATION N/A 02/20/2016   Procedure: SAVORY DILATION;  Surgeon: SanDanie BinderD;  [facility];  Service: Endoscopy;  Laterality: N/A;   SAVORY DILATION N/A 11/24/2018   Procedure: SAVORY DILATION;  Surgeon: FieDanie BinderD;  [facility];  Service: Endoscopy;  Laterality: N/A;   UPPER GASTROINTESTINAL ENDOSCOPY  JAN 2009 ABD PAIN   GASTRITIS, ESO RING   UPPER GASTROINTESTINAL ENDOSCOPY  OCT 2010 ABD PAIN, DYSPHAGIA   DIL 15MM, GASTRITIS, NL DUODENUM   UPPER GASTROINTESTINAL ENDOSCOPY  OCT 2010 DYSPHAGIA   DIL 17 MM, GASTRITIS, NL DUODENUM   vocal cord surgery  Sep 20, 2010   precancerous areas removed   wisdom tooth extraction Right    pin placement due to broken jaw   Patient Active Problem List   Diagnosis Date Noted   Encounter for general adult medical examination with abnormal findings 12/27/2021   Rash 08/14/2021   Hypothyroidism 12/13/2020   S/P lumbar fusion 10/19/2019   Seasonal allergies 09/01/2018   Chronic low back pain 04/07/2018   Perianal cyst 02/24/2018   Alcohol abuse 12/23/2017   Tobacco abuse 06/25/2017   Osteoarthritis 12/27/2014   Allergic dermatitis eyelid 12/27/2014   Abnormal CT scan, chest 01/12/2014   Polycythemia 09/08/2012   Vertigo 01/22/2012   Neck pain 01/22/2012   Chronic pain syndrome 12/27/2011   Diarrhea, episodic 12/27/2011   Edema of larynx 04/22/2011   Dysphagia 03/14/2011   DEPRESSION 04/24/2010   [place]itis [place]/16/[place]   Osteoporosis 11/11/2008   Spondylolisthesis, lumbar region 07/28/2008   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/2007   DEGENERATIVE DISTyroSEASE, CERVICAL SPINE 06/29/2007    Hyperlipidemia 02/03/2007   VARCIES, SITE NEC 09/30/2006   Anxiety state 02/27/2006   Essential hypertension 02/27/2006   GERD 02/27/2006   Crohn's disease of small intestine with complication ([id number] PCP: RutIhor DowD  REFERRING PROVIDER: RutIhor DowD  REFERRING DIAG: M43.16 (ICD-10-CM) - Spondylolisthesis, lumbar region   Rationale for Evaluation and Treatment Rehabilitation  THERAPY DIAG:  Low back pain, unspecified back pain laterality, unspecified chronicity, unspecified whether sciatica present  Muscle weakness (generalized)  Difficulty in walking, not elsewhere classified  ONSET DATE: June 2023  SUBJECTIVE:  SUBJECTIVE STATEMENT: "[place]". Got stuck in traffic and running late today. Wants to keep her bones and body from deteriorating.   PERTINENT HISTORY:  Chron's disease  PAIN:  Are you having pain? Yes: NPRS scale: 4-5/10 Pain location: back and RLE Pain description: sore Aggravating factors: sweeping, too much activity Relieving factors: medication   PRECAUTIONS: None  WEIGHT BEARING RESTRICTIONS No  FALLS:  Has patient fallen in last 6 months? No  LIVING ENVIRONMENT: Lives with: lives with their family and lives with their spouse Lives in: House/apartment Stairs: Yes: External: 4 steps; on right going up Has following equipment at home: Single point cane walking stick  OCCUPATION: Does not work  PLOF: Independent  PATIENT GOALS Be able to wash her windows, clean house with less pain. Do some yard work.   OBJECTIVE:   DIAGNOSTIC FINDINGS:  IMPRESSION: 1. Status post L4-L5 fusion. No spinal canal stenosis. Susceptibility artifact from hardware obscures the neural foramina. 2. Mild left neural foraminal narrowing at L3-L4, and  possibly at L5-S1.   On: 10/12/202  PATIENT SURVEYS:  FOTO 29%  SCREENING FOR RED FLAGS: Bowel or bladder incontinence: No Spinal tumors: No Cauda equina syndrome: No Compression fracture: No Abdominal aneurysm: No  COGNITION:  Overall cognitive status: Within functional limits for tasks assessed     SENSATION: Light touch: Impaired  Rt foot, reports hx of neuropathy    POSTURE: rounded shoulders, forward head, and increased thoracic kyphosis   LUMBAR ROM:   Active  A/PROM  eval  Flexion Mid shin  Extension Limited 50%  Right lateral flexion Top of patella  Left lateral flexion Past mid jointline  Right rotation wfl  Left rotation wfl   (Blank rows = not tested)   LOWER EXTREMITY MMT:    MMT (Low effort) Right eval Left eval  Hip flexion 4 4  Hip extension 2 2  Hip abduction 4- 4-  Hip adduction 4 4  Hip internal rotation    Hip external rotation    Knee flexion 3 3  Knee extension 3+ 5  Ankle dorsiflexion 4- 4+  Ankle plantarflexion    Ankle inversion    Ankle eversion     (Blank rows = not tested)  LUMBAR SPECIAL TESTS:  Slump test: Negative  FUNCTIONAL TESTS:  5 times sit to stand: 17.5 sec 2 minute walk test: 306 feet no AD  GAIT: Distance walked: 306 feet no AD Assistive device utilized: None Level of assistance: Complete Independence Comments: Very erratic; no consistent gait pattern, changing speed, veering, frequently, wide and narrow BOS but inconsistent throughout distance. Not ataxic.    TODAY'S TREATMENT  02/13/22 Ab brace 10 x 5" Glute set 10 x5" Ab march x10 each LTR 5 x 10" Bridge x10   02/12/22 Review of goals  Sidelying: Clam x 10 each  Supine: LTR x 10 Transverse abdominus 5" hold x 10 TA contraction with marching x 10 Bridge x 8 (painful)  Sitting: Heel/toe raises x 10 Hamstring stretch with strap 5 x 10" (painful)    EVAL MMT, ROM 2MWT 5x Sit to stand   PATIENT EDUCATION:  Education details:  findings, safety awareness, POC, PT role Person educated: Patient Education method: Explanation Education comprehension: verbalized understanding   HOME EXERCISE PROGRAM: Access Code: [email or web address] URL: [email or web address]  02/13/22 - Hooklying Gluteal Sets  - 1 x daily - 7 x weekly - 1 sets - 10 reps - 3-5 second hold  Date: 02/12/2022 Prepared by: [facility] - [facility]hab  Exercises -  Clamshell  - 1 x daily - 7 x weekly - 1 sets - 10 reps - Supine Lower Trunk Rotation  - 1 x daily - 7 x weekly - 1 sets - 10 reps - Supine Transversus Abdominis Bracing - Hands on Stomach  - 1 x daily - 7 x weekly - 1 sets - 10 reps - 5 sec hold - Supine Bridge  - 1 x daily - 7 x weekly - 1 sets - 10 reps - Supine March  - 1 x daily - 7 x weekly - 1 sets - 10 reps  ASSESSMENT:  CLINICAL IMPRESSION: Session limited per late arrival. Completed prior HEP exercise and added glute set for glute activation prior to bridging. Noting decreased pain with bridging following. Added to HEP and printed handout. Patient will continue to benefit from skilled therapy services to reduce remaining deficits and improve functional ability.    OBJECTIVE IMPAIRMENTS Abnormal gait, decreased activity tolerance, decreased balance, decreased endurance, decreased knowledge of condition, decreased knowledge of use of DME, decreased mobility, difficulty walking, decreased ROM, decreased strength, decreased safety awareness, hypomobility, impaired flexibility, impaired sensation, improper body mechanics, postural dysfunction, and pain.   ACTIVITY LIMITATIONS carrying, lifting, bending, sitting, standing, squatting, stairs, and locomotion level  PARTICIPATION LIMITATIONS: cleaning, laundry, driving, shopping, community activity, and yard work  PERSONAL FACTORS Age, Behavior pattern, and Time since onset of injury/illness/exacerbation are also affecting patient's functional outcome.   REHAB POTENTIAL: Good  CLINICAL DECISION  MAKING: Stable/uncomplicated  EVALUATION COMPLEXITY: Low   GOALS: Goals reviewed with patient? Yes  SHORT TERM GOALS: Target date: 02/19/22  Patient will be independent with initial HEP and self-management strategies to improve functional outcomes Baseline: Initiated Goal status: IN PROGRESS    LONG TERM GOALS: Target date: 03/12/22  Patient will be independent with advanced HEP and self-management strategies to improve functional outcomes Baseline:  Goal status: IN PROGRESS  2.  Patient will improve FOTO score to predicted value of 45% to indicate improvement in functional outcomes Baseline: 29% Goal status: IN PROGRESS  3.  Patient will report reduction of back pain to <3/10 for improved quality of life and ability to perform ADLs  Baseline: 8/10 Goal status: IN PROGRESS  4. Patient will have equal to or > 4+/5 MMT throughout BIL LEs to improve ability to perform functional mobility, stair ambulation and ADLs.  Baseline: See above Goal status: IN PROGRESS   PLAN: PT FREQUENCY: 2x/week  PT DURATION: 6 weeks  PLANNED INTERVENTIONS: Therapeutic exercises, Therapeutic activity, Neuromuscular re-education, Balance training, Gait training, Patient/Family education, Self Care, Joint mobilization, Joint manipulation, Stair training, DME instructions, Dry Needling, Electrical stimulation, Spinal manipulation, Spinal mobilization, Cryotherapy, Moist heat, Taping, Traction, Ultrasound, Ionotophoresis 84m/ml Dexamethasone, Manual therapy, and Re-evaluation.  PLAN FOR NEXT SESSION: Core and LE strengthening, gait and balance training as needed. Add sit to stand , scapular retractions  11:59 AM, 02/13/22 CJosue HectorPT DPT  Physical Therapist with [facility] ([phone]

## 2022-02-14 ENCOUNTER — Encounter (HOSPITAL_COMMUNITY): Payer: Medicare Other | Admitting: Physical Therapy

## 2022-02-16 ENCOUNTER — Other Ambulatory Visit: Payer: Self-pay | Admitting: Internal Medicine

## 2022-02-16 DIAGNOSIS — I10 Essential (primary) hypertension: Secondary | ICD-10-CM

## 2022-02-20 ENCOUNTER — Ambulatory Visit (HOSPITAL_COMMUNITY): Payer: Medicare Other

## 2022-02-20 ENCOUNTER — Encounter (HOSPITAL_COMMUNITY): Payer: Self-pay

## 2022-02-20 DIAGNOSIS — M545 Low back pain, unspecified: Secondary | ICD-10-CM

## 2022-02-20 DIAGNOSIS — R262 Difficulty in walking, not elsewhere classified: Secondary | ICD-10-CM

## 2022-02-20 DIAGNOSIS — M5416 Radiculopathy, lumbar region: Secondary | ICD-10-CM | POA: Diagnosis not present

## 2022-02-20 DIAGNOSIS — M6281 Muscle weakness (generalized): Secondary | ICD-10-CM | POA: Diagnosis not present

## 2022-02-20 NOTE — Therapy (Signed)
OUTPATIENT PHYSICAL THERAPY THORACOLUMBAR TREATMENT   Patient Name: [name] [name] MRN: [id number] DOB:February 20, 1956, 66 y.o., female Today's Date: 02/20/2022   PT End of Session - 02/20/22 1341     Visit Number 4    Number of Visits 12    Date for PT Re-Evaluation 03/12/22    Authorization Type [employer] Medicare (Medicaid [place] secondary)    Authorization Time Period (No auth, no LV)    Progress Note Due on Visit 10    PT Start Time 1301    PT Stop Time 1341    PT Time Calculation (min) 40 min    Activity Tolerance Patient tolerated treatment well    Behavior During Therapy 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   Crohn disease ([place])    CTS (carpal tunnel syndrome)    Diarrhea MULITIFACTORIAL   IBS, LACTOSE INTOLERANCE, SBBO, BILE-SALT   Elevated liver enzymes 2009: BMI 24 ?Etoh ALT 94, AST 51,  NEG ANA, qIGs& ASMA   MAY 2012 AST 52 ALT 38   Esophageal stricture 2009   GERD (gastroesophageal reflux disease)    Hemorrhoid    Hyperlipemia    Hypertension    Inflammatory bowel disease 2006 CD   SURGICAL REMISSION   LBP (low back pain)    Mental disorder    Migraine    Osteoporosis    PONV (postoperative nausea and vomiting)    Shortness of breath    exertion and humidity   Shoulder pain    right   Sleep apnea    Stop Bang Cock score of 4   Past Surgical History:  Procedure Laterality Date   BACK SURGERY     BACK SURGERY  07/16/2018   BILATERAL SALPINGOOPHORECTOMY     BIOPSY  12/24/2011   Procedure: BIOPSY;  Surgeon: Danie Binder, MD;  Location: [facility];  Service: Endoscopy;;  Gastric Biopsies   BIOPSY N/A 06/30/2012   Procedure: BIOPSY;  Surgeon: Danie Binder, MD;  Location: [facility];  Service: Endoscopy;  Laterality: N/A;  Gastric and  Esophageal Biopsies   CARPAL TUNNEL RELEASE  LEFT   CATARACT EXTRACTION W/PHACO Right 07/30/2016   Procedure: CATARACT EXTRACTION PHACO AND INTRAOCULAR LENS PLACEMENT ([place]);  Surgeon: Rutherford Guys, MD;  Location: [facility];  Service: Ophthalmology;  Laterality: Right;  CDE: 5.45   CATARACT EXTRACTION W/PHACO Left 08/13/2016   Procedure: CATARACT EXTRACTION PHACO AND INTRAOCULAR LENS PLACEMENT (IOC);  Surgeon: Rutherford Guys, MD;  Location: [facility];  Service: Ophthalmology;  Laterality: Left;  CDE: 5.59   COLON SURGERY     COLONOSCOPY  JAN 2009 DIARRHEA, ABD PAIN, BRBPR   ANASTOMOTIC ULCER(3MM), IH IF:OYDXAJ ULCER, NL COLON bX   COLONOSCOPY WITH PROPOFOL N/A 04/21/2018   examined portion of the ileum normal, two 3-5 mm polyps, diverticulosis in the rectosigmoid and sigmoid colon, external and internal hemorrhoids, significant looping of the colon.  Pathology with tubular adenomas.  Recommendations to follow high-fiber diet and repeat colonoscopy in 5 to 10 years with peds colonoscope.   DILATION AND CURETTAGE OF UTERUS     ESOPHAGEAL DILATION  12/24/2011   SLF: A stricture was found  in the distal esophagus/ Moderate gastritis/ MILD Duodenitis   ESOPHAGOGASTRODUODENOSCOPY  02/07/09   mild gastritis   ESOPHAGOGASTRODUODENOSCOPY (EGD) WITH PROPOFOL N/A 06/30/2012   SLF: 1. No definite stricture appreciated 2. small hiatal hernia 3. Gastritis   ESOPHAGOGASTRODUODENOSCOPY (EGD) WITH PROPOFOL N/A 02/20/2016   Procedure: ESOPHAGOGASTRODUODENOSCOPY (EGD) WITH PROPOFOL;  Surgeon: Danie Binder, MD;  [facility];  Service: Endoscopy;  Laterality: N/A;  930   ESOPHAGOGASTRODUODENOSCOPY (EGD) WITH PROPOFOL N/A 11/24/2018   benign-appearing esophageal peptic stricture s/p dilation, small hiatal hernia, moderate erosive gastritis.    EYE SURGERY     gum flap Bilateral    HEMICOLECTOMY  RIGHT 2006   HERNIA REPAIR     LUMBAR DISC SURGERY     POLYPECTOMY  04/21/2018   Procedure: POLYPECTOMY;  Surgeon:  Danie Binder, MD;  [facility];  Service: Endoscopy;;  (colon)   SAVORY DILATION N/A 06/30/2012   Procedure: SAVORY DILATION;  Surgeon: Danie Binder, MD;  Location: [facility];  Service: Endoscopy;  Laterality: N/A;  12.31m , 120m 1551m SAVORY DILATION N/A 02/20/2016   Procedure: SAVORY DILATION;  Surgeon: SanDanie BinderD;  [facility];  Service: Endoscopy;  Laterality: N/A;   SAVORY DILATION N/A 11/24/2018   Procedure: SAVORY DILATION;  Surgeon: FieDanie BinderD;  [facility];  Service: Endoscopy;  Laterality: N/A;   UPPER GASTROINTESTINAL ENDOSCOPY  JAN 2009 ABD PAIN   GASTRITIS, ESO RING   UPPER GASTROINTESTINAL ENDOSCOPY  OCT 2010 ABD PAIN, DYSPHAGIA   DIL 15MM, GASTRITIS, NL DUODENUM   UPPER GASTROINTESTINAL ENDOSCOPY  OCT 2010 DYSPHAGIA   DIL 17 MM, GASTRITIS, NL DUODENUM   vocal cord surgery  Sep 20, 2010   precancerous areas removed   wisdom tooth extraction Right    pin placement due to broken jaw   Patient Active Problem List   Diagnosis Date Noted   Encounter for general adult medical examination with abnormal findings 12/27/2021   Rash 08/14/2021   Hypothyroidism 12/13/2020   S/P lumbar fusion 10/19/2019   Seasonal allergies 09/01/2018   Chronic low back pain 04/07/2018   Perianal cyst 02/24/2018   Alcohol abuse 12/23/2017   Tobacco abuse 06/25/2017   Osteoarthritis 12/27/2014   Allergic dermatitis eyelid 12/27/2014   Abnormal CT scan, chest 01/12/2014   Polycythemia 09/08/2012   Vertigo 01/22/2012   Neck pain 01/22/2012   Chronic pain syndrome 12/27/2011   Diarrhea, episodic 12/27/2011   Edema of larynx 04/22/2011   Dysphagia 03/14/2011   DEPRESSION 04/24/2010   Chronic bronchitis ([place]9/16/2011   Osteoporosis 11/11/2008   Spondylolisthesis, lumbar region 07/28/2008   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/2007   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, CERVICAL SPINE 06/29/2007   Hyperlipidemia 02/03/2007   VARCIES, SITE  NEC 09/30/2006   Anxiety state 02/27/2006   Essential hypertension 02/27/2006   GERD 02/27/2006   Crohn's disease of small intestine with complication ([id number] PCP: RutIhor DowD  REFERRING PROVIDER: RutIhor DowD  REFERRING DIAG: M43.16 (ICD-10-CM) - Spondylolisthesis, lumbar region   Rationale for Evaluation and Treatment Rehabilitation  THERAPY DIAG:  Low back pain, unspecified back pain laterality, unspecified chronicity, unspecified whether sciatica present  Muscle weakness (generalized)  Difficulty in walking, not elsewhere classified  Radiculopathy, lumbar region  ONSET DATE: June 2023  SUBJECTIVE:  SUBJECTIVE STATEMENT: Pt reports her back is tight, difficult to rate pain scale.  Reports she is having difficulty with Rt foot pain, with swelling present.  PERTINENT HISTORY:  Chron's disease  PAIN:  Are you having pain? Yes: NPRS scale: 1-2 or 3-4/10 Pain location: back and RLE Pain description: sore Aggravating factors: sweeping, too much activity Relieving factors: medication   PRECAUTIONS: None  WEIGHT BEARING RESTRICTIONS No  FALLS:  Has patient fallen in last 6 months? No  LIVING ENVIRONMENT: Lives with: lives with their family and lives with their spouse Lives in: House/apartment Stairs: Yes: External: 4 steps; on right going up Has following equipment at home: Single point cane walking stick  OCCUPATION: Does not work  PLOF: Independent  PATIENT GOALS Be able to wash her windows, clean house with less pain. Do some yard work.   OBJECTIVE:   DIAGNOSTIC FINDINGS:  IMPRESSION: 1. Status post L4-L5 fusion. No spinal canal stenosis. Susceptibility artifact from hardware obscures the neural foramina. 2. Mild left neural foraminal narrowing at  L3-L4, and possibly at L5-S1.   On: 10/12/202  PATIENT SURVEYS:  FOTO 29%  SCREENING FOR RED FLAGS: Bowel or bladder incontinence: No Spinal tumors: No Cauda equina syndrome: No Compression fracture: No Abdominal aneurysm: No  COGNITION:  Overall cognitive status: Within functional limits for tasks assessed     SENSATION: Light touch: Impaired  Rt foot, reports hx of neuropathy    POSTURE: rounded shoulders, forward head, and increased thoracic kyphosis   LUMBAR ROM:   Active  A/PROM  eval  Flexion Mid shin  Extension Limited 50%  Right lateral flexion Top of patella  Left lateral flexion Past mid jointline  Right rotation wfl  Left rotation wfl   (Blank rows = not tested)   LOWER EXTREMITY MMT:    MMT (Low effort) Right eval Left eval  Hip flexion 4 4  Hip extension 2 2  Hip abduction 4- 4-  Hip adduction 4 4  Hip internal rotation    Hip external rotation    Knee flexion 3 3  Knee extension 3+ 5  Ankle dorsiflexion 4- 4+  Ankle plantarflexion    Ankle inversion    Ankle eversion     (Blank rows = not tested)  LUMBAR SPECIAL TESTS:  Slump test: Negative  FUNCTIONAL TESTS:  5 times sit to stand: 17.5 sec 2 minute walk test: 306 feet no AD  GAIT: Distance walked: 306 feet no AD Assistive device utilized: None Level of assistance: Complete Independence Comments: Very erratic; no consistent gait pattern, changing speed, veering, frequently, wide and narrow BOS but inconsistent throughout distance. Not ataxic.    TODAY'S TREATMENT  02/20/22: STS 10x no HHA eccentric control Standing: GTB  shoulder extension 10x  Row 10x  Scapular retraction 10x Heel raise 15x Squat front of chair 10x (cueing for mechanics)  Prone: hip extension 10x5" Sidelying: Abduction 10x BLE   02/13/22 Ab brace 10 x 5" Glute set 10 x5" Ab march x10 each LTR 5 x 10" Bridge x10   02/12/22 Review of goals  Sidelying: Clam x 10 each  Supine: LTR x  10 Transverse abdominus 5" hold x 10 TA contraction with marching x 10 Bridge x 8 (painful)  Sitting: Heel/toe raises x 10 Hamstring stretch with strap 5 x 10" (painful)    EVAL MMT, ROM 2MWT 5x Sit to stand   PATIENT EDUCATION:  Education details: findings, safety awareness, POC, PT role Person educated: Patient Education method: Explanation Education comprehension:  verbalized understanding   HOME EXERCISE PROGRAM: Access Code: [email or web address] URL: [email or web address] 02/20/22: STS Hip abd Hip extension  02/13/22 - Hooklying Gluteal Sets  - 1 x daily - 7 x weekly - 1 sets - 10 reps - 3-5 second hold  Date: 02/12/2022 Prepared by: [facility]  Exercises - Clamshell  - 1 x daily - 7 x weekly - 1 sets - 10 reps - Supine Lower Trunk Rotation  - 1 x daily - 7 x weekly - 1 sets - 10 reps - Supine Transversus Abdominis Bracing - Hands on Stomach  - 1 x daily - 7 x weekly - 1 sets - 10 reps - 5 sec hold - Supine Bridge  - 1 x daily - 7 x weekly - 1 sets - 10 reps - Supine March  - 1 x daily - 7 x weekly - 1 sets - 10 reps  ASSESSMENT:  CLINICAL IMPRESSION: Progressed to standing and gluteal strengthening exercises.  Pt required tactile/verbal for form/mechanics through session.  Pt tolerated well to session though does c/o increase gluteal fatigue following exercises.  Advanced HEP with additional prone, sidelying and STS exercises with handout given.     OBJECTIVE IMPAIRMENTS Abnormal gait, decreased activity tolerance, decreased balance, decreased endurance, decreased knowledge of condition, decreased knowledge of use of DME, decreased mobility, difficulty walking, decreased ROM, decreased strength, decreased safety awareness, hypomobility, impaired flexibility, impaired sensation, improper body mechanics, postural dysfunction, and pain.   ACTIVITY LIMITATIONS carrying, lifting, bending, sitting, standing, squatting, stairs, and locomotion  level  PARTICIPATION LIMITATIONS: cleaning, laundry, driving, shopping, community activity, and yard work  PERSONAL FACTORS Age, Behavior pattern, and Time since onset of injury/illness/exacerbation are also affecting patient's functional outcome.   REHAB POTENTIAL: Good  CLINICAL DECISION MAKING: Stable/uncomplicated  EVALUATION COMPLEXITY: Low   GOALS: Goals reviewed with patient? Yes  SHORT TERM GOALS: Target date: 02/19/22  Patient will be independent with initial HEP and self-management strategies to improve functional outcomes Baseline: Initiated Goal status: IN PROGRESS    LONG TERM GOALS: Target date: 03/12/22  Patient will be independent with advanced HEP and self-management strategies to improve functional outcomes Baseline:  Goal status: IN PROGRESS  2.  Patient will improve FOTO score to predicted value of 45% to indicate improvement in functional outcomes Baseline: 29% Goal status: IN PROGRESS  3.  Patient will report reduction of back pain to <3/10 for improved quality of life and ability to perform ADLs  Baseline: 8/10 Goal status: IN PROGRESS  4. Patient will have equal to or > 4+/5 MMT throughout BIL LEs to improve ability to perform functional mobility, stair ambulation and ADLs.  Baseline: See above Goal status: IN PROGRESS   PLAN: PT FREQUENCY: 2x/week  PT DURATION: 6 weeks  PLANNED INTERVENTIONS: Therapeutic exercises, Therapeutic activity, Neuromuscular re-education, Balance training, Gait training, Patient/Family education, Self Care, Joint mobilization, Joint manipulation, Stair training, DME instructions, Dry Needling, Electrical stimulation, Spinal manipulation, Spinal mobilization, Cryotherapy, Moist heat, Taping, Traction, Ultrasound, Ionotophoresis 52m/ml Dexamethasone, Manual therapy, and Re-evaluation.  PLAN FOR NEXT SESSION: Core and LE strengthening, gait and balance training as needed. Add standing stability exercises.  CIhor Austin LPTA/CLT; CBIS [phone] 1:47 PM, 02/20/22

## 2022-02-21 ENCOUNTER — Other Ambulatory Visit: Payer: Self-pay | Admitting: Internal Medicine

## 2022-02-21 DIAGNOSIS — B370 Candidal stomatitis: Secondary | ICD-10-CM

## 2022-02-22 ENCOUNTER — Ambulatory Visit (HOSPITAL_COMMUNITY): Payer: Medicare Other | Admitting: Physical Therapy

## 2022-02-22 ENCOUNTER — Encounter (HOSPITAL_COMMUNITY): Payer: Self-pay | Admitting: Physical Therapy

## 2022-02-22 DIAGNOSIS — M5416 Radiculopathy, lumbar region: Secondary | ICD-10-CM | POA: Diagnosis not present

## 2022-02-22 DIAGNOSIS — M6281 Muscle weakness (generalized): Secondary | ICD-10-CM

## 2022-02-22 DIAGNOSIS — M545 Low back pain, unspecified: Secondary | ICD-10-CM

## 2022-02-22 DIAGNOSIS — R262 Difficulty in walking, not elsewhere classified: Secondary | ICD-10-CM | POA: Diagnosis not present

## 2022-02-22 NOTE — Therapy (Signed)
OUTPATIENT PHYSICAL THERAPY THORACOLUMBAR TREATMENT   Patient Name: [name] [name] MRN: [id number] DOB:[birth year], 66 y.o., female Today's Date: 02/22/2022   PT End of Session - 02/22/22 1312     Visit Number 5    Number of Visits 12    Date for PT Re-Evaluation 03/12/22    Authorization Type [employer] Medicare (Medicaid [place] secondary)    Authorization Time Period (No auth, no LV)    Progress Note Due on Visit 10    PT Start Time 1258    PT Stop Time 1344    PT Time Calculation (min) 46 min    Activity Tolerance Patient tolerated treatment well    Behavior During Therapy [facility] for tasks assessed/performed               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   Crohn disease ([place])    CTS (carpal tunnel syndrome)    Diarrhea MULITIFACTORIAL   IBS, LACTOSE INTOLERANCE, SBBO, BILE-SALT   Elevated liver enzymes 2009: BMI 24 ?Etoh ALT 94, AST 51,  NEG ANA, qIGs& ASMA   MAY 2012 AST 52 ALT 38   Esophageal stricture 2009   GERD (gastroesophageal reflux disease)    Hemorrhoid    Hyperlipemia    Hypertension    Inflammatory bowel disease 2006 CD   SURGICAL REMISSION   LBP (low back pain)    Mental disorder    Migraine    Osteoporosis    PONV (postoperative nausea and vomiting)    Shortness of breath    exertion and humidity   Shoulder pain    right   Sleep apnea    Stop Bang Cock score of 4   Past Surgical History:  Procedure Laterality Date   BACK SURGERY     BACK SURGERY  07/16/2018   BILATERAL SALPINGOOPHORECTOMY     BIOPSY  12/24/2011   Procedure: BIOPSY;  Surgeon: Danie Binder, MD;  Location: [facility];  Service: Endoscopy;;  Gastric Biopsies   BIOPSY N/A 06/30/2012   Procedure: BIOPSY;  Surgeon: Danie Binder, MD;  Location: [facility];  Service: Endoscopy;  Laterality: N/A;  Gastric and  Esophageal Biopsies   CARPAL TUNNEL RELEASE  LEFT   CATARACT EXTRACTION W/PHACO Right 07/30/2016   Procedure: CATARACT EXTRACTION PHACO AND INTRAOCULAR LENS PLACEMENT ([place]);  Surgeon: Rutherford Guys, MD;  Location: [facility];  Service: Ophthalmology;  Laterality: Right;  CDE: 5.45   CATARACT EXTRACTION W/PHACO Left 08/13/2016   Procedure: CATARACT EXTRACTION PHACO AND INTRAOCULAR LENS PLACEMENT (I[facility]);  Surgeon: Rutherford Guys, MD;  Location: [facility];  Service: Ophthalmology;  Laterality: Left;  CDE: 5.59   COLON SURGERY     COLONOSCOPY  JAN 2009 DIARRHEA, ABD PAIN, BRBPR   ANASTOMOTIC ULCER(3MM), IH UX:LKGMWN ULCER, NL COLON bX   COLONOSCOPY WITH PROPOFOL N/A 04/21/2018   examined portion of the ileum normal, two 3-5 mm polyps, diverticulosis in the rectosigmoid and sigmoid colon, external and internal hemorrhoids, significant looping of the colon.  Pathology with tubular adenomas.  Recommendations to follow high-fiber diet and repeat colonoscopy in 5 to 10 years with peds colonoscope.   DILATION AND CURETTAGE OF UTERUS     ESOPHAGEAL DILATION  12/24/2011   SLF: A stricture was found  in the distal esophagus/ Moderate gastritis/ MILD Duodenitis   ESOPHAGOGASTRODUODENOSCOPY  02/07/09   mild gastritis   ESOPHAGOGASTRODUODENOSCOPY (EGD) WITH PROPOFOL N/A 06/30/2012   SLF: 1. No definite stricture appreciated 2. small hiatal hernia 3. Gastritis   ESOPHAGOGASTRODUODENOSCOPY (EGD) WITH PROPOFOL N/A 02/20/2016   Procedure: ESOPHAGOGASTRODUODENOSCOPY (EGD) WITH PROPOFOL;  Surgeon: Danie Binder, MD;  [facility];  Service: Endoscopy;  Laterality: N/A;  930   ESOPHAGOGASTRODUODENOSCOPY (EGD) WITH PROPOFOL N/A 11/24/2018   benign-appearing esophageal peptic stricture s/p dilation, small hiatal hernia, moderate erosive gastritis.    EYE SURGERY     gum flap Bilateral    HEMICOLECTOMY  RIGHT 2006   HERNIA REPAIR     LUMBAR DISC SURGERY     POLYPECTOMY  04/21/2018   Procedure: POLYPECTOMY;  Surgeon:  Danie Binder, MD;  [facility];  Service: Endoscopy;;  (colon)   SAVORY DILATION N/A 06/30/2012   Procedure: SAVORY DILATION;  Surgeon: Danie Binder, MD;  Location: [facility];  Service: Endoscopy;  Laterality: N/A;  12.33m , 142m 1555m SAVORY DILATION N/A 02/20/2016   Procedure: SAVORY DILATION;  Surgeon: SanDanie BinderD;  [facility];  Service: Endoscopy;  Laterality: N/A;   SAVORY DILATION N/A 11/24/2018   Procedure: SAVORY DILATION;  Surgeon: FieDanie BinderD;  [facility];  Service: Endoscopy;  Laterality: N/A;   UPPER GASTROINTESTINAL ENDOSCOPY  JAN 2009 ABD PAIN   GASTRITIS, ESO RING   UPPER GASTROINTESTINAL ENDOSCOPY  OCT 2010 ABD PAIN, DYSPHAGIA   DIL 15MM, GASTRITIS, NL DUODENUM   UPPER GASTROINTESTINAL ENDOSCOPY  OCT 2010 DYSPHAGIA   DIL 17 MM, GASTRITIS, NL DUODENUM   vocal cord surgery  Sep 20, 2010   precancerous areas removed   wisdom tooth extraction Right    pin placement due to broken jaw   Patient Active Problem List   Diagnosis Date Noted   Encounter for general adult medical examination with abnormal findings 12/27/2021   Rash 08/14/2021   Hypothyroidism 12/13/2020   S/P lumbar fusion 10/19/2019   Seasonal allergies 09/01/2018   Chronic low back pain 04/07/2018   Perianal cyst 02/24/2018   Alcohol abuse 12/23/2017   Tobacco abuse 06/25/2017   Osteoarthritis 12/27/2014   Allergic dermatitis eyelid 12/27/2014   Abnormal CT scan, chest 01/12/2014   Polycythemia 09/08/2012   Vertigo 01/22/2012   Neck pain 01/22/2012   Chronic pain syndrome 12/27/2011   Diarrhea, episodic 12/27/2011   Edema of larynx 04/22/2011   Dysphagia 03/14/2011   DEPRESSION 04/24/2010   Chronic bronchitis (HCCMarienthal9/16/2011   Osteoporosis 11/11/2008   Spondylolisthesis, lumbar region 07/28/2008   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/2007   DEGENERATIVE DISGrannisSEASE, CERVICAL SPINE 06/29/2007   Hyperlipidemia 02/03/2007   VARCIES, SITE  NEC 09/30/2006   Anxiety state 02/27/2006   Essential hypertension 02/27/2006   GERD 02/27/2006   Crohn's disease of small intestine with complication (HCCMullica [id number] PCP: RutIhor DowD  REFERRING PROVIDER: RutIhor DowD  REFERRING DIAG: M43.16 (ICD-10-CM) - Spondylolisthesis, lumbar region   Rationale for Evaluation and Treatment Rehabilitation  THERAPY DIAG:  Low back pain, unspecified back pain laterality, unspecified chronicity, unspecified whether sciatica present  Radiculopathy, lumbar region  Difficulty in walking, not elsewhere classified  Muscle weakness (generalized)  ONSET DATE: June 2023  SUBJECTIVE:  SUBJECTIVE STATEMENT: States she found prone hip extension was difficult last visit. No new issues since then.  PERTINENT HISTORY:  Chron's disease  PAIN:  Are you having pain? Yes: NPRS scale: 1-2 or 3-4/10 Pain location: back and RLE Pain description: sore Aggravating factors: sweeping, too much activity Relieving factors: medication   PRECAUTIONS: None  WEIGHT BEARING RESTRICTIONS No  FALLS:  Has patient fallen in last 6 months? No  LIVING ENVIRONMENT: Lives with: lives with their family and lives with their spouse Lives in: House/apartment Stairs: Yes: External: 4 steps; on right going up Has following equipment at home: Single point cane walking stick  OCCUPATION: Does not work  PLOF: Independent  PATIENT GOALS Be able to wash her windows, clean house with less pain. Do some yard work.   OBJECTIVE:   DIAGNOSTIC FINDINGS:  IMPRESSION: 1. Status post L4-L5 fusion. No spinal canal stenosis. Susceptibility artifact from hardware obscures the neural foramina. 2. Mild left neural foraminal narrowing at L3-L4, and possibly at L5-S1.   On:  10/12/202  PATIENT SURVEYS:  FOTO 29%  SCREENING FOR RED FLAGS: Bowel or bladder incontinence: No Spinal tumors: No Cauda equina syndrome: No Compression fracture: No Abdominal aneurysm: No  COGNITION:  Overall cognitive status: Within functional limits for tasks assessed     SENSATION: Light touch: Impaired  Rt foot, reports hx of neuropathy    POSTURE: rounded shoulders, forward head, and increased thoracic kyphosis   LUMBAR ROM:   Active  A/PROM  eval  Flexion Mid shin  Extension Limited 50%  Right lateral flexion Top of patella  Left lateral flexion Past mid jointline  Right rotation wfl  Left rotation wfl   (Blank rows = not tested)   LOWER EXTREMITY MMT:    MMT (Low effort) Right eval Left eval  Hip flexion 4 4  Hip extension 2 2  Hip abduction 4- 4-  Hip adduction 4 4  Hip internal rotation    Hip external rotation    Knee flexion 3 3  Knee extension 3+ 5  Ankle dorsiflexion 4- 4+  Ankle plantarflexion    Ankle inversion    Ankle eversion     (Blank rows = not tested)  LUMBAR SPECIAL TESTS:  Slump test: Negative  FUNCTIONAL TESTS:  5 times sit to stand: 17.5 sec 2 minute walk test: 306 feet no AD  GAIT: Distance walked: 306 feet no AD Assistive device utilized: None Level of assistance: Complete Independence Comments: Very erratic; no consistent gait pattern, changing speed, veering, frequently, wide and narrow BOS but inconsistent throughout distance. Not ataxic.    TODAY'S TREATMENT  02/22/22: STS 2x10 no HHA eccentric control Standing: GTB  shoulder extension 2x10  Row 2x10  Scapular retraction 2x10 Heel raise 15x Squat front of chair 2x10 (cueing for mechanics) Lateral steps along wall 10' x 1RT Prone: hip extension 10x RLE Sidelying: Abduction 10x BLE   02/20/22: STS 10x no HHA eccentric control Standing: GTB  shoulder extension 10x  Row 10x  Scapular retraction 10x Heel raise 15x Squat front of chair 10x (cueing  for mechanics)   Prone: hip extension 10x5" Sidelying: Abduction 10x BLE   02/13/22 Ab brace 10 x 5" Glute set 10 x5" Ab march x10 each LTR 5 x 10" Bridge x10     PATIENT EDUCATION:  Education details: findings, safety awareness, POC, PT role Person educated: Patient Education method: Explanation Education comprehension: verbalized understanding   HOME EXERCISE PROGRAM: Access Code: [email or web address] URL: [email or web address] 02/20/22: STS Hip  abd Hip extension  02/13/22 - Hooklying Gluteal Sets  - 1 x daily - 7 x weekly - 1 sets - 10 reps - 3-5 second hold  Date: 02/12/2022 Prepared by: [facility]  Exercises - Clamshell  - 1 x daily - 7 x weekly - 1 sets - 10 reps - Supine Lower Trunk Rotation  - 1 x daily - 7 x weekly - 1 sets - 10 reps - Supine Transversus Abdominis Bracing - Hands on Stomach  - 1 x daily - 7 x weekly - 1 sets - 10 reps - 5 sec hold - Supine Bridge  - 1 x daily - 7 x weekly - 1 sets - 10 reps - Supine March  - 1 x daily - 7 x weekly - 1 sets - 10 reps  ASSESSMENT:  CLINICAL IMPRESSION: Frequent cues for technique and to remain focused on task at hand throughout session. Tried RB, did not tolerate and was d/c. Unable to perform prone Lt hip ext due to discomfort. Otherwise tolerated other exercises without symptoms. Patient will continue to benefit from skilled physical therapy services to further improve independence with functional mobility.    OBJECTIVE IMPAIRMENTS Abnormal gait, decreased activity tolerance, decreased balance, decreased endurance, decreased knowledge of condition, decreased knowledge of use of DME, decreased mobility, difficulty walking, decreased ROM, decreased strength, decreased safety awareness, hypomobility, impaired flexibility, impaired sensation, improper body mechanics, postural dysfunction, and pain.   ACTIVITY LIMITATIONS carrying, lifting, bending, sitting, standing, squatting, stairs, and locomotion  level  PARTICIPATION LIMITATIONS: cleaning, laundry, driving, shopping, community activity, and yard work  PERSONAL FACTORS Age, Behavior pattern, and Time since onset of injury/illness/exacerbation are also affecting patient's functional outcome.   REHAB POTENTIAL: Good  CLINICAL DECISION MAKING: Stable/uncomplicated  EVALUATION COMPLEXITY: Low   GOALS: Goals reviewed with patient? Yes  SHORT TERM GOALS: Target date: 02/19/22  Patient will be independent with initial HEP and self-management strategies to improve functional outcomes Baseline: Initiated Goal status: IN PROGRESS    LONG TERM GOALS: Target date: 03/12/22  Patient will be independent with advanced HEP and self-management strategies to improve functional outcomes Baseline:  Goal status: IN PROGRESS  2.  Patient will improve FOTO score to predicted value of 45% to indicate improvement in functional outcomes Baseline: 29% Goal status: IN PROGRESS  3.  Patient will report reduction of back pain to <3/10 for improved quality of life and ability to perform ADLs  Baseline: 8/10 Goal status: IN PROGRESS  4. Patient will have equal to or > 4+/5 MMT throughout BIL LEs to improve ability to perform functional mobility, stair ambulation and ADLs.  Baseline: See above Goal status: IN PROGRESS   PLAN: PT FREQUENCY: 2x/week  PT DURATION: 6 weeks  PLANNED INTERVENTIONS: Therapeutic exercises, Therapeutic activity, Neuromuscular re-education, Balance training, Gait training, Patient/Family education, Self Care, Joint mobilization, Joint manipulation, Stair training, DME instructions, Dry Needling, Electrical stimulation, Spinal manipulation, Spinal mobilization, Cryotherapy, Moist heat, Taping, Traction, Ultrasound, Ionotophoresis 81m/ml Dexamethasone, Manual therapy, and Re-evaluation.  PLAN FOR NEXT SESSION: Core and LE strengthening, gait and balance training as needed. Add standing stability exercises.  LCandie Mile  PT, DPT Physical Therapist [email or web address]  2:05 PM, 02/22/22

## 2022-02-25 ENCOUNTER — Ambulatory Visit (HOSPITAL_COMMUNITY): Payer: Medicare Other | Admitting: Physical Therapy

## 2022-02-25 DIAGNOSIS — M6281 Muscle weakness (generalized): Secondary | ICD-10-CM | POA: Diagnosis not present

## 2022-02-25 DIAGNOSIS — M5416 Radiculopathy, lumbar region: Secondary | ICD-10-CM

## 2022-02-25 DIAGNOSIS — M545 Low back pain, unspecified: Secondary | ICD-10-CM

## 2022-02-25 DIAGNOSIS — R262 Difficulty in walking, not elsewhere classified: Secondary | ICD-10-CM

## 2022-02-25 NOTE — Therapy (Signed)
OUTPATIENT PHYSICAL THERAPY THORACOLUMBAR TREATMENT   Patient Name: [name] [name] MRN: [id number] DOB:02-05-1956, 66 y.o., female Today's Date: 02/25/2022   PT End of Session - 02/25/22 1301     Visit Number 6    Number of Visits 12    Date for PT Re-Evaluation 03/12/22    Authorization Type U[employer] Medicare (Medicaid [place] secondary)    Authorization Time Period (No auth, no LV)    Progress Note Due on Visit 10    PT Start Time 1300    PT Stop Time 1340    PT Time Calculation (min) 40 min    Activity Tolerance Patient tolerated treatment well    Behavior During Therapy [facility] for tasks assessed/performed               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   Crohn disease ([place])    CTS (carpal tunnel syndrome)    Diarrhea MULITIFACTORIAL   IBS, LACTOSE INTOLERANCE, SBBO, BILE-SALT   Elevated liver enzymes 2009: BMI 24 ?Etoh ALT 94, AST 51,  NEG ANA, qIGs& ASMA   MAY 2012 AST 52 ALT 38   Esophageal stricture 2009   GERD (gastroesophageal reflux disease)    Hemorrhoid    Hyperlipemia    Hypertension    Inflammatory bowel disease 2006 CD   SURGICAL REMISSION   LBP (low back pain)    Mental disorder    Migraine    Osteoporosis    PONV (postoperative nausea and vomiting)    Shortness of breath    exertion and humidity   Shoulder pain    right   Sleep apnea    Stop Bang Cock score of 4   Past Surgical History:  Procedure Laterality Date   BACK SURGERY     BACK SURGERY  07/16/2018   BILATERAL SALPINGOOPHORECTOMY     BIOPSY  12/24/2011   Procedure: BIOPSY;  Surgeon: Danie Binder, MD;  Location: [facility];  Service: Endoscopy;;  Gastric Biopsies   BIOPSY N/A 06/30/2012   Procedure: BIOPSY;  Surgeon: Danie Binder, MD;  Location: [facility];  Service: Endoscopy;  Laterality: N/A;  Gastric and  Esophageal Biopsies   CARPAL TUNNEL RELEASE  LEFT   CATARACT EXTRACTION W/PHACO Right 07/30/2016   Procedure: CATARACT EXTRACTION PHACO AND INTRAOCULAR LENS PLACEMENT ([place]);  Surgeon: Rutherford Guys, MD;  Location: [facility];  Service: Ophthalmology;  Laterality: Right;  CDE: 5.45   CATARACT EXTRACTION W/PHACO Left 08/13/2016   Procedure: CATARACT EXTRACTION PHACO AND INTRAOCULAR LENS PLACEMENT ([facility]);  Surgeon: Rutherford Guys, MD;  Location: [facility];  Service: Ophthalmology;  Laterality: Left;  CDE: 5.59   COLON SURGERY     COLONOSCOPY  JAN 2009 DIARRHEA, ABD PAIN, BRBPR   ANASTOMOTIC ULCER(3MM), IH OE:UMPNTI ULCER, NL COLON bX   COLONOSCOPY WITH PROPOFOL N/A 04/21/2018   examined portion of the ileum normal, two 3-5 mm polyps, diverticulosis in the rectosigmoid and sigmoid colon, external and internal hemorrhoids, significant looping of the colon.  Pathology with tubular adenomas.  Recommendations to follow high-fiber diet and repeat colonoscopy in 5 to 10 years with peds colonoscope.   DILATION AND CURETTAGE OF UTERUS     ESOPHAGEAL DILATION  12/24/2011   SLF: A stricture was found  in the distal esophagus/ Moderate gastritis/ MILD Duodenitis   ESOPHAGOGASTRODUODENOSCOPY  02/07/09   mild gastritis   ESOPHAGOGASTRODUODENOSCOPY (EGD) WITH PROPOFOL N/A 06/30/2012   SLF: 1. No definite stricture appreciated 2. small hiatal hernia 3. Gastritis   ESOPHAGOGASTRODUODENOSCOPY (EGD) WITH PROPOFOL N/A 02/20/2016   Procedure: ESOPHAGOGASTRODUODENOSCOPY (EGD) WITH PROPOFOL;  Surgeon: Danie Binder, MD;  [facility];  Service: Endoscopy;  Laterality: N/A;  930   ESOPHAGOGASTRODUODENOSCOPY (EGD) WITH PROPOFOL N/A 11/24/2018   benign-appearing esophageal peptic stricture s/p dilation, small hiatal hernia, moderate erosive gastritis.    EYE SURGERY     gum flap Bilateral    HEMICOLECTOMY  RIGHT 2006   HERNIA REPAIR     LUMBAR DISC SURGERY     POLYPECTOMY  04/21/2018   Procedure: POLYPECTOMY;  Surgeon:  Danie Binder, MD;  [facility];  Service: Endoscopy;;  (colon)   SAVORY DILATION N/A 06/30/2012   Procedure: SAVORY DILATION;  Surgeon: Danie Binder, MD;  Location: [facility];  Service: Endoscopy;  Laterality: N/A;  12.13m , 15m 1557m SAVORY DILATION N/A 02/20/2016   Procedure: SAVORY DILATION;  Surgeon: SanDanie BinderD;  [facility];  Service: Endoscopy;  Laterality: N/A;   SAVORY DILATION N/A 11/24/2018   Procedure: SAVORY DILATION;  Surgeon: FieDanie BinderD;  [facility];  Service: Endoscopy;  Laterality: N/A;   UPPER GASTROINTESTINAL ENDOSCOPY  JAN 2009 ABD PAIN   GASTRITIS, ESO RING   UPPER GASTROINTESTINAL ENDOSCOPY  OCT 2010 ABD PAIN, DYSPHAGIA   DIL 15MM, GASTRITIS, NL DUODENUM   UPPER GASTROINTESTINAL ENDOSCOPY  OCT 2010 DYSPHAGIA   DIL 17 MM, GASTRITIS, NL DUODENUM   vocal cord surgery  Sep 20, 2010   precancerous areas removed   wisdom tooth extraction Right    pin placement due to broken jaw   Patient Active Problem List   Diagnosis Date Noted   Encounter for general adult medical examination with abnormal findings 12/27/2021   Rash 08/14/2021   Hypothyroidism 12/13/2020   S/P lumbar fusion 10/19/2019   Seasonal allergies 09/01/2018   Chronic low back pain 04/07/2018   Perianal cyst 02/24/2018   Alcohol abuse 12/23/2017   Tobacco abuse 06/25/2017   Osteoarthritis 12/27/2014   Allergic dermatitis eyelid 12/27/2014   Abnormal CT scan, chest 01/12/2014   Polycythemia 09/08/2012   Vertigo 01/22/2012   Neck pain 01/22/2012   Chronic pain syndrome 12/27/2011   Diarrhea, episodic 12/27/2011   Edema of larynx 04/22/2011   Dysphagia 03/14/2011   DEPRESSION 04/24/2010   Chronic bronchitis ([place]CC[place]9/16/2011   Osteoporosis 11/11/2008   Spondylolisthesis, lumbar region 07/28/2008   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/2007   DEGENERATIVE DISConcordSEASE, CERVICAL SPINE 06/29/2007   Hyperlipidemia 02/03/2007   VARCIES, SITE  NEC 09/30/2006   Anxiety state 02/27/2006   Essential hypertension 02/27/2006   GERD 02/27/2006   Crohn's disease of small intestine with complication ([id number] PCP: RutIhor DowD  REFERRING PROVIDER: RutIhor DowD  REFERRING DIAG: M43.16 (ICD-10-CM) - Spondylolisthesis, lumbar region   Rationale for Evaluation and Treatment Rehabilitation  THERAPY DIAG:  Low back pain, unspecified back pain laterality, unspecified chronicity, unspecified whether sciatica present  Radiculopathy, lumbar region  Difficulty in walking, not elsewhere classified  Muscle weakness (generalized)  ONSET DATE: June 2023  SUBJECTIVE:  SUBJECTIVE STATEMENT: Pt comes today upset as she just found out her brother passed yesterday.  States she almost didn't make it today but made herself come.  Currently with "pinching" in her central LB and down her Lt LE.     PERTINENT HISTORY:  Chron's disease  PAIN:  Are you having pain? Yes: NPRS scale: 3/10 Pain location: back and Lt LE Pain description: sore Aggravating factors: sweeping, too much activity Relieving factors: medication   PRECAUTIONS: None  WEIGHT BEARING RESTRICTIONS No  FALLS:  Has patient fallen in last 6 months? No  LIVING ENVIRONMENT: Lives with: lives with their family and lives with their spouse Lives in: House/apartment Stairs: Yes: External: 4 steps; on right going up Has following equipment at home: Single point cane walking stick  OCCUPATION: Does not work  PLOF: Independent  PATIENT GOALS Be able to wash her windows, clean house with less pain. Do some yard work.   OBJECTIVE:   DIAGNOSTIC FINDINGS:  IMPRESSION: 1. Status post L4-L5 fusion. No spinal canal stenosis. Susceptibility artifact from hardware obscures the  neural foramina. 2. Mild left neural foraminal narrowing at L3-L4, and possibly at L5-S1.   On: 10/12/202  PATIENT SURVEYS:  FOTO 29%  SCREENING FOR RED FLAGS: Bowel or bladder incontinence: No Spinal tumors: No Cauda equina syndrome: No Compression fracture: No Abdominal aneurysm: No  COGNITION:  Overall cognitive status: Within functional limits for tasks assessed     SENSATION: Light touch: Impaired  Rt foot, reports hx of neuropathy    POSTURE: rounded shoulders, forward head, and increased thoracic kyphosis   LUMBAR ROM:   Active  A/PROM  eval  Flexion Mid shin  Extension Limited 50%  Right lateral flexion Top of patella  Left lateral flexion Past mid jointline  Right rotation wfl  Left rotation wfl   (Blank rows = not tested)   LOWER EXTREMITY MMT:    MMT (Low effort) Right eval Left eval  Hip flexion 4 4  Hip extension 2 2  Hip abduction 4- 4-  Hip adduction 4 4  Hip internal rotation    Hip external rotation    Knee flexion 3 3  Knee extension 3+ 5  Ankle dorsiflexion 4- 4+  Ankle plantarflexion    Ankle inversion    Ankle eversion     (Blank rows = not tested)  LUMBAR SPECIAL TESTS:  Slump test: Negative  FUNCTIONAL TESTS:  5 times sit to stand: 17.5 sec 2 minute walk test: 306 feet no AD  GAIT: Distance walked: 306 feet no AD Assistive device utilized: None Level of assistance: Complete Independence Comments: Very erratic; no consistent gait pattern, changing speed, veering, frequently, wide and narrow BOS but inconsistent throughout distance. Not ataxic.    TODAY'S TREATMENT  02/25/22 STS 2x10 no HHA eccentric control Standing:  Heel raise 2X10 Squat front of chair 2x10  Lateral step ups 4" 2X10 with 1 UE assist Forward step ups 4" 2X10 with 1 UE assist  02/22/22: STS 2x10 no HHA eccentric control Standing: GTB  shoulder extension 2x10  Row 2x10  Scapular retraction 2x10 Heel raise 15x Squat front of chair 2x10  (cueing for mechanics) Lateral steps along wall 10' x 1RT Prone: hip extension 10x RLE Sidelying: Abduction 10x BLE  02/20/22: STS 10x no HHA eccentric control Standing: GTB  shoulder extension 10x  Row 10x  Scapular retraction 10x Heel raise 15x Squat front of chair 10x (cueing for mechanics)  Prone: hip extension 10x5" Sidelying: Abduction 10x BLE  02/13/22 Ab brace 10 x 5" Glute set 10 x5" Ab march x10 each LTR 5 x 10" Bridge x10   PATIENT EDUCATION:  Education details: findings, safety awareness, POC, PT role Person educated: Patient Education method: Explanation Education comprehension: verbalized understanding  HOME EXERCISE PROGRAM: Access Code: [email or web address] URL: [email or web address] 02/20/22: STS Hip abd Hip extension  02/13/22 - Hooklying Gluteal Sets  - 1 x daily - 7 x weekly - 1 sets - 10 reps - 3-5 second hold  Date: 02/12/2022 Prepared by: [facility] - [facility]hab  Exercises - Clamshell  - 1 x daily - 7 x weekly - 1 sets - 10 reps - Supine Lower Trunk Rotation  - 1 x daily - 7 x weekly - 1 sets - 10 reps - Supine Transversus Abdominis Bracing - Hands on Stomach  - 1 x daily - 7 x weekly - 1 sets - 10 reps - 5 sec hold - Supine Bridge  - 1 x daily - 7 x weekly - 1 sets - 10 reps - Supine March  - 1 x daily - 7 x weekly - 1 sets - 10 reps  ASSESSMENT:  CLINICAL IMPRESSION: Pt able to complete session today despite recent news of brother passing.  Began with seated and standing exercises.  Frequent cues for technique and to remain focused on task during session.  Pt with several episodes of breaking down due to being upset this session.  Completed forward and lateral step ups in good form. Patient will continue to benefit from skilled physical therapy services to further improve independence with functional mobility.  OBJECTIVE IMPAIRMENTS Abnormal gait, decreased activity tolerance, decreased balance, decreased endurance, decreased knowledge of  condition, decreased knowledge of use of DME, decreased mobility, difficulty walking, decreased ROM, decreased strength, decreased safety awareness, hypomobility, impaired flexibility, impaired sensation, improper body mechanics, postural dysfunction, and pain.   ACTIVITY LIMITATIONS carrying, lifting, bending, sitting, standing, squatting, stairs, and locomotion level  PARTICIPATION LIMITATIONS: cleaning, laundry, driving, shopping, community activity, and yard work  PERSONAL FACTORS Age, Behavior pattern, and Time since onset of injury/illness/exacerbation are also affecting patient's functional outcome.   REHAB POTENTIAL: Good  CLINICAL DECISION MAKING: Stable/uncomplicated  EVALUATION COMPLEXITY: Low   GOALS: Goals reviewed with patient? Yes  SHORT TERM GOALS: Target date: 02/19/22  Patient will be independent with initial HEP and self-management strategies to improve functional outcomes Baseline: Initiated Goal status: IN PROGRESS    LONG TERM GOALS: Target date: 03/12/22  Patient will be independent with advanced HEP and self-management strategies to improve functional outcomes Baseline:  Goal status: IN PROGRESS  2.  Patient will improve FOTO score to predicted value of 45% to indicate improvement in functional outcomes Baseline: 29% Goal status: IN PROGRESS  3.  Patient will report reduction of back pain to <3/10 for improved quality of life and ability to perform ADLs  Baseline: 8/10 Goal status: IN PROGRESS  4. Patient will have equal to or > 4+/5 MMT throughout BIL LEs to improve ability to perform functional mobility, stair ambulation and ADLs.  Baseline: See above Goal status: IN PROGRESS   PLAN: PT FREQUENCY: 2x/week  PT DURATION: 6 weeks  PLANNED INTERVENTIONS: Therapeutic exercises, Therapeutic activity, Neuromuscular re-education, Balance training, Gait training, Patient/Family education, Self Care, Joint mobilization, Joint manipulation, Stair  training, DME instructions, Dry Needling, Electrical stimulation, Spinal manipulation, Spinal mobilization, Cryotherapy, Moist heat, Taping, Traction, Ultrasound, Ionotophoresis 68m/ml Dexamethasone, Manual therapy, and Re-evaluation.  PLAN FOR NEXT SESSION: Core and LE strengthening, gait and balance training  as needed. Progress standing stability exercises.  Teena Irani, PTA/CLT [place] Ph: [phone]  1:02 PM, 02/25/22

## 2022-02-27 ENCOUNTER — Ambulatory Visit (HOSPITAL_COMMUNITY): Payer: Medicare Other | Admitting: Physical Therapy

## 2022-02-27 DIAGNOSIS — M5416 Radiculopathy, lumbar region: Secondary | ICD-10-CM

## 2022-02-27 DIAGNOSIS — M545 Low back pain, unspecified: Secondary | ICD-10-CM | POA: Diagnosis not present

## 2022-02-27 DIAGNOSIS — R262 Difficulty in walking, not elsewhere classified: Secondary | ICD-10-CM

## 2022-02-27 DIAGNOSIS — M6281 Muscle weakness (generalized): Secondary | ICD-10-CM | POA: Diagnosis not present

## 2022-02-27 NOTE — Therapy (Signed)
OUTPATIENT PHYSICAL THERAPY THORACOLUMBAR TREATMENT   Patient Name: [name] [name] MRN: [id number] DOB:1955-11-15, 66 y.o., female Today's Date: 02/27/2022   PT End of Session - 02/27/22 1136     Visit Number 7    Number of Visits 12    Date for PT Re-Evaluation 03/12/22    Authorization Type UHC Medicare (Medicaid [place] secondary)    Authorization Time Period (No auth, no LV)    Progress Note Due on Visit 10    PT Start Time 1123    PT Stop Time 1202    PT Time Calculation (min) 39 min    Activity Tolerance Patient tolerated treatment well    Behavior During Therapy [facility] for tasks assessed/performed               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   Crohn disease ([place])    CTS (carpal tunnel syndrome)    Diarrhea MULITIFACTORIAL   IBS, LACTOSE INTOLERANCE, SBBO, BILE-SALT   Elevated liver enzymes 2009: BMI 24 ?Etoh ALT 94, AST 51,  NEG ANA, qIGs& ASMA   MAY 2012 AST 52 ALT 38   Esophageal stricture 2009   GERD (gastroesophageal reflux disease)    Hemorrhoid    Hyperlipemia    Hypertension    Inflammatory bowel disease 2006 CD   SURGICAL REMISSION   LBP (low back pain)    Mental disorder    Migraine    Osteoporosis    PONV (postoperative nausea and vomiting)    Shortness of breath    exertion and humidity   Shoulder pain    right   Sleep apnea    Stop Bang Cock score of 4   Past Surgical History:  Procedure Laterality Date   BACK SURGERY     BACK SURGERY  07/16/2018   BILATERAL SALPINGOOPHORECTOMY     BIOPSY  12/24/2011   Procedure: BIOPSY;  Surgeon: Danie Binder, MD;  Location: [facility];  Service: Endoscopy;;  Gastric Biopsies   BIOPSY N/A 06/30/2012   Procedure: BIOPSY;  Surgeon: Danie Binder, MD;  Location: [facility];  Service: Endoscopy;  Laterality: N/A;  Gastric and  Esophageal Biopsies   CARPAL TUNNEL RELEASE  LEFT   CATARACT EXTRACTION W/PHACO Right 07/30/2016   Procedure: CATARACT EXTRACTION PHACO AND INTRAOCULAR LENS PLACEMENT ([place]);  Surgeon: Rutherford Guys, MD;  Location: [facility];  Service: Ophthalmology;  Laterality: Right;  CDE: 5.45   CATARACT EXTRACTION W/PHACO Left 08/13/2016   Procedure: CATARACT EXTRACTION PHACO AND INTRAOCULAR LENS PLACEMENT ([facility]);  Surgeon: Rutherford Guys, MD;  Location: [facility];  Service: Ophthalmology;  Laterality: Left;  CDE: 5.59   COLON SURGERY     COLONOSCOPY  JAN 2009 DIARRHEA, ABD PAIN, BRBPR   ANASTOMOTIC ULCER(3MM), IH AS:TMHDQQ ULCER, NL COLON bX   COLONOSCOPY WITH PROPOFOL N/A 04/21/2018   examined portion of the ileum normal, two 3-5 mm polyps, diverticulosis in the rectosigmoid and sigmoid colon, external and internal hemorrhoids, significant looping of the colon.  Pathology with tubular adenomas.  Recommendations to follow high-fiber diet and repeat colonoscopy in 5 to 10 years with peds colonoscope.   DILATION AND CURETTAGE OF UTERUS     ESOPHAGEAL DILATION  12/24/2011   SLF: A stricture was found  in the distal esophagus/ Moderate gastritis/ MILD Duodenitis   ESOPHAGOGASTRODUODENOSCOPY  02/07/09   mild gastritis   ESOPHAGOGASTRODUODENOSCOPY (EGD) WITH PROPOFOL N/A 06/30/2012   SLF: 1. No definite stricture appreciated 2. small hiatal hernia 3. Gastritis   ESOPHAGOGASTRODUODENOSCOPY (EGD) WITH PROPOFOL N/A 02/20/2016   Procedure: ESOPHAGOGASTRODUODENOSCOPY (EGD) WITH PROPOFOL;  Surgeon: Danie Binder, MD;  [facility];  Service: Endoscopy;  Laterality: N/A;  930   ESOPHAGOGASTRODUODENOSCOPY (EGD) WITH PROPOFOL N/A 11/24/2018   benign-appearing esophageal peptic stricture s/p dilation, small hiatal hernia, moderate erosive gastritis.    EYE SURGERY     gum flap Bilateral    HEMICOLECTOMY  RIGHT 2006   HERNIA REPAIR     LUMBAR DISC SURGERY     POLYPECTOMY  04/21/2018   Procedure: POLYPECTOMY;  Surgeon:  Danie Binder, MD;  [facility];  Service: Endoscopy;;  (colon)   SAVORY DILATION N/A 06/30/2012   Procedure: SAVORY DILATION;  Surgeon: Danie Binder, MD;  Location: [facility];  Service: Endoscopy;  Laterality: N/A;  12.62m , 140m 1562m SAVORY DILATION N/A 02/20/2016   Procedure: SAVORY DILATION;  Surgeon: SanDanie BinderD;  [facility];  Service: Endoscopy;  Laterality: N/A;   SAVORY DILATION N/A 11/24/2018   Procedure: SAVORY DILATION;  Surgeon: FieDanie BinderD;  [facility];  Service: Endoscopy;  Laterality: N/A;   UPPER GASTROINTESTINAL ENDOSCOPY  JAN 2009 ABD PAIN   GASTRITIS, ESO RING   UPPER GASTROINTESTINAL ENDOSCOPY  OCT 2010 ABD PAIN, DYSPHAGIA   DIL 15MM, GASTRITIS, NL DUODENUM   UPPER GASTROINTESTINAL ENDOSCOPY  OCT 2010 DYSPHAGIA   DIL 17 MM, GASTRITIS, NL DUODENUM   vocal cord surgery  Sep 20, 2010   precancerous areas removed   wisdom tooth extraction Right    pin placement due to broken jaw   Patient Active Problem List   Diagnosis Date Noted   Encounter for general adult medical examination with abnormal findings 12/27/2021   Rash 08/14/2021   Hypothyroidism 12/13/2020   S/P lumbar fusion 10/19/2019   Seasonal allergies 09/01/2018   Chronic low back pain 04/07/2018   Perianal cyst 02/24/2018   Alcohol abuse 12/23/2017   Tobacco abuse 06/25/2017   Osteoarthritis 12/27/2014   Allergic dermatitis eyelid 12/27/2014   Abnormal CT scan, chest 01/12/2014   Polycythemia 09/08/2012   Vertigo 01/22/2012   Neck pain 01/22/2012   Chronic pain syndrome 12/27/2011   Diarrhea, episodic 12/27/2011   Edema of larynx 04/22/2011   Dysphagia 03/14/2011   DEPRESSION 04/24/2010   [place] bronchitis ([place]9/16/2011   Osteoporosis 11/11/2008   Spondylolisthesis, lumbar region 07/28/2008   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/2007   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  ONSET DATE: June 2023  SUBJECTIVE:  SUBJECTIVE STATEMENT: Pt reports her brother's service is today but too far away to go to.  States she is still upset over his passing.  Reports pain in Lt hip and around into Lt hip flexor at 3/10.  PERTINENT HISTORY:  Chron's disease  PAIN:  Are you having pain? Yes: NPRS scale: 3/10 Pain location: back and Lt LE Pain description: sore Aggravating factors: sweeping, too much activity Relieving factors: medication   PRECAUTIONS: None  WEIGHT BEARING RESTRICTIONS No  FALLS:  Has patient fallen in last 6 months? No  LIVING ENVIRONMENT: Lives with: lives with their family and lives with their spouse Lives in: House/apartment Stairs: Yes: External: 4 steps; on right going up Has following equipment at home: Single point cane walking stick  OCCUPATION: Does not work  PLOF: Independent  PATIENT GOALS Be able to wash her windows, clean house with less pain. Do some yard work.   OBJECTIVE:   DIAGNOSTIC FINDINGS:  IMPRESSION: 1. Status post L4-L5 fusion. No spinal canal stenosis. Susceptibility artifact from hardware obscures the neural foramina. 2. Mild left neural foraminal narrowing at L3-L4, and possibly at L5-S1.   On: 10/12/202  PATIENT SURVEYS:  FOTO 29%  SCREENING FOR RED FLAGS: Bowel or bladder incontinence: No Spinal tumors: No Cauda  equina syndrome: No Compression fracture: No Abdominal aneurysm: No  COGNITION:  Overall cognitive status: Within functional limits for tasks assessed     SENSATION: Light touch: Impaired  Rt foot, reports hx of neuropathy    POSTURE: rounded shoulders, forward head, and increased thoracic kyphosis   LUMBAR ROM:   Active  A/PROM  eval  Flexion Mid shin  Extension Limited 50%  Right lateral flexion Top of patella  Left lateral flexion Past mid jointline  Right rotation wfl  Left rotation wfl   (Blank rows = not tested)   LOWER EXTREMITY MMT:    MMT (Low effort) Right eval Left eval  Hip flexion 4 4  Hip extension 2 2  Hip abduction 4- 4-  Hip adduction 4 4  Hip internal rotation    Hip external rotation    Knee flexion 3 3  Knee extension 3+ 5  Ankle dorsiflexion 4- 4+  Ankle plantarflexion    Ankle inversion    Ankle eversion     (Blank rows = not tested)  LUMBAR SPECIAL TESTS:  Slump test: Negative  FUNCTIONAL TESTS:  5 times sit to stand: 17.5 sec 2 minute walk test: 306 feet no AD  GAIT: Distance walked: 306 feet no AD Assistive device utilized: None Level of assistance: Complete Independence Comments: Very erratic; no consistent gait pattern, changing speed, veering, frequently, wide and narrow BOS but inconsistent throughout distance. Not ataxic.   TODAY'S TREATMENT   02/27/22 STS 2x10 no HHA eccentric control Standing:  Heel raise 2X10 Squat front of chair 2x10  Forward step ups 4" 10X with 1 UE assist Alternating high march 2X10 Seated piriformis stretch 30" each, hip flexor stretch 30" each  Leg extension hamstring stretch 20" each LE  02/25/22 STS 2x10 no HHA eccentric control Standing:  Heel raise 2X10 Squat front of chair 2x10  Lateral step ups 4" 2X10 with 1 UE assist Forward step ups 4" 2X10 with 1 UE assist  02/22/22: STS 2x10 no HHA eccentric control Standing: GTB  shoulder extension 2x10  Row 2x10  Scapular  retraction 2x10 Heel raise 15x Squat front of chair 2x10 (cueing for mechanics) Lateral steps along wall 10' x 1RT Prone: hip extension 10x  RLE Sidelying: Abduction 10x BLE  02/20/22: STS 10x no HHA eccentric control Standing: GTB  shoulder extension 10x  Row 10x  Scapular retraction 10x Heel raise 15x Squat front of chair 10x (cueing for mechanics)  Prone: hip extension 10x5" Sidelying: Abduction 10x BLE  02/13/22 Ab brace 10 x 5" Glute set 10 x5" Ab march x10 each LTR 5 x 10" Bridge x10   PATIENT EDUCATION:  Education details: findings, safety awareness, POC, PT role Person educated: Patient Education method: Explanation Education comprehension: verbalized understanding  HOME EXERCISE PROGRAM: Access Code: [email or web address] URL: [email or web address] 02/20/22: STS Hip abd Hip extension  02/13/22 - Hooklying Gluteal Sets  - 1 x daily - 7 x weekly - 1 sets - 10 reps - 3-5 second hold  Date: 02/12/2022 Prepared by: [facility] - Rehab  Exercises - Clamshell  - 1 x daily - 7 x weekly - 1 sets - 10 reps - Supine Lower Trunk Rotation  - 1 x daily - 7 x weekly - 1 sets - 10 reps - Supine Transversus Abdominis Bracing - Hands on Stomach  - 1 x daily - 7 x weekly - 1 sets - 10 reps - 5 sec hold - Supine Bridge  - 1 x daily - 7 x weekly - 1 sets - 10 reps - Supine March  - 1 x daily - 7 x weekly - 1 sets - 10 reps  ASSESSMENT:  CLINICAL IMPRESSION: Pt with increased c/o discomfort in Lt hip and around into groin region with all exercises this session.  Decreased to only 1 set of 10 reps for exercises and added seated stretches towards end of session which helped to reduce discomfort.  Pt requires redirection to focus on task and cues for hold times and improvement in form for most all exercises. Pt with slight antalgia at conclusion of treatment, however reported it felt better following stretches. Patient will continue to benefit from skilled physical therapy services to  further improve independence with functional mobility.  OBJECTIVE IMPAIRMENTS Abnormal gait, decreased activity tolerance, decreased balance, decreased endurance, decreased knowledge of condition, decreased knowledge of use of DME, decreased mobility, difficulty walking, decreased ROM, decreased strength, decreased safety awareness, hypomobility, impaired flexibility, impaired sensation, improper body mechanics, postural dysfunction, and pain.   ACTIVITY LIMITATIONS carrying, lifting, bending, sitting, standing, squatting, stairs, and locomotion level  PARTICIPATION LIMITATIONS: cleaning, laundry, driving, shopping, community activity, and yard work  PERSONAL FACTORS Age, Behavior pattern, and Time since onset of injury/illness/exacerbation are also affecting patient's functional outcome.   REHAB POTENTIAL: Good  CLINICAL DECISION MAKING: Stable/uncomplicated  EVALUATION COMPLEXITY: Low   GOALS: Goals reviewed with patient? Yes  SHORT TERM GOALS: Target date: 02/19/22  Patient will be independent with initial HEP and self-management strategies to improve functional outcomes Baseline: Initiated Goal status: IN PROGRESS    LONG TERM GOALS: Target date: 03/12/22  Patient will be independent with advanced HEP and self-management strategies to improve functional outcomes Baseline:  Goal status: IN PROGRESS  2.  Patient will improve FOTO score to predicted value of 45% to indicate improvement in functional outcomes Baseline: 29% Goal status: IN PROGRESS  3.  Patient will report reduction of back pain to <3/10 for improved quality of life and ability to perform ADLs  Baseline: 8/10 Goal status: IN PROGRESS  4. Patient will have equal to or > 4+/5 MMT throughout BIL LEs to improve ability to perform functional mobility, stair ambulation and ADLs.  Baseline: See above Goal status: IN PROGRESS  PLAN: PT FREQUENCY: 2x/week  PT DURATION: 6 weeks  PLANNED INTERVENTIONS:  Therapeutic exercises, Therapeutic activity, Neuromuscular re-education, Balance training, Gait training, Patient/Family education, Self Care, Joint mobilization, Joint manipulation, Stair training, DME instructions, Dry Needling, Electrical stimulation, Spinal manipulation, Spinal mobilization, Cryotherapy, Moist heat, Taping, Traction, Ultrasound, Ionotophoresis 80m/ml Dexamethasone, Manual therapy, and Re-evaluation.  PLAN FOR NEXT SESSION: Core and LE strengthening, gait and balance training as needed. Progress standing stability exercises.  ATeena Irani PTA/CLT CPaloma Creek SouthPh: [phone] 11:36 AM, 02/27/22

## 2022-03-05 ENCOUNTER — Encounter (HOSPITAL_COMMUNITY): Payer: Medicare Other | Admitting: Physical Therapy

## 2022-03-05 DIAGNOSIS — M5442 Lumbago with sciatica, left side: Secondary | ICD-10-CM | POA: Diagnosis not present

## 2022-03-05 DIAGNOSIS — Z79891 Long term (current) use of opiate analgesic: Secondary | ICD-10-CM | POA: Diagnosis not present

## 2022-03-05 DIAGNOSIS — M4326 Fusion of spine, lumbar region: Secondary | ICD-10-CM | POA: Diagnosis not present

## 2022-03-05 DIAGNOSIS — G894 Chronic pain syndrome: Secondary | ICD-10-CM | POA: Diagnosis not present

## 2022-03-05 DIAGNOSIS — M549 Dorsalgia, unspecified: Secondary | ICD-10-CM | POA: Diagnosis not present

## 2022-03-05 DIAGNOSIS — Z79899 Other long term (current) drug therapy: Secondary | ICD-10-CM | POA: Diagnosis not present

## 2022-03-07 ENCOUNTER — Encounter (HOSPITAL_COMMUNITY): Payer: Medicare Other | Admitting: Physical Therapy

## 2022-03-07 ENCOUNTER — Other Ambulatory Visit: Payer: Self-pay | Admitting: Internal Medicine

## 2022-03-07 DIAGNOSIS — J302 Other seasonal allergic rhinitis: Secondary | ICD-10-CM

## 2022-03-12 ENCOUNTER — Encounter (HOSPITAL_COMMUNITY): Payer: Medicare Other

## 2022-03-14 ENCOUNTER — Ambulatory Visit (HOSPITAL_COMMUNITY): Payer: Medicare Other | Attending: Internal Medicine | Admitting: Physical Therapy

## 2022-03-14 DIAGNOSIS — M5416 Radiculopathy, lumbar region: Secondary | ICD-10-CM | POA: Insufficient documentation

## 2022-03-14 DIAGNOSIS — M545 Low back pain, unspecified: Secondary | ICD-10-CM | POA: Insufficient documentation

## 2022-03-14 DIAGNOSIS — M6281 Muscle weakness (generalized): Secondary | ICD-10-CM | POA: Diagnosis not present

## 2022-03-14 DIAGNOSIS — R262 Difficulty in walking, not elsewhere classified: Secondary | ICD-10-CM | POA: Diagnosis not present

## 2022-03-14 NOTE — Addendum Note (Signed)
Addended byDonnal Debar, Warren Lacy S on: 03/14/2022 03:30 PM   Modules accepted: Orders

## 2022-03-14 NOTE — Therapy (Addendum)
OUTPATIENT PHYSICAL THERAPY THORACOLUMBAR TREATMENT Progress Note Reporting Period 01/29/22 to 03/14/22  See note below for Objective Data and Assessment of Progress/Goals.       Patient Name: [name] [name] MRN: [id number] DOB:Jul 15, 1955, 66 y.o., female Today's Date: 03/14/2022   PT End of Session - 03/14/22 1110     Visit Number 8    Number of Visits 18    Date for PT Re-Evaluation 04/25/22    Authorization Type [employer] Medicare (Medicaid [place] secondary)    Authorization Time Period (No auth, no LV)    Progress Note Due on Visit 18    PT Start Time 1110    PT Stop Time 1150    PT Time Calculation (min) 40 min    Activity Tolerance Patient tolerated treatment well    Behavior During Therapy [facility] for tasks assessed/performed                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   Crohn disease ([place])    CTS (carpal tunnel syndrome)    Diarrhea MULITIFACTORIAL   IBS, LACTOSE INTOLERANCE, SBBO, BILE-SALT   Elevated liver enzymes 2009: BMI 24 ?Etoh ALT 94, AST 51,  NEG ANA, qIGs& ASMA   MAY 2012 AST 52 ALT 38   Esophageal stricture 2009   GERD (gastroesophageal reflux disease)    Hemorrhoid    Hyperlipemia    Hypertension    Inflammatory bowel disease 2006 CD   SURGICAL REMISSION   LBP (low back pain)    Mental disorder    Migraine    Osteoporosis    PONV (postoperative nausea and vomiting)    Shortness of breath    exertion and humidity   Shoulder pain    right   Sleep apnea    Stop Bang Cock score of 4   Past Surgical History:  Procedure Laterality Date   BACK SURGERY     BACK SURGERY  07/16/2018   BILATERAL SALPINGOOPHORECTOMY     BIOPSY  12/24/2011   Procedure: BIOPSY;  Surgeon: Danie Binder, MD;  Location: [facility];  Service: Endoscopy;;  Gastric Biopsies   BIOPSY N/A  06/30/2012   Procedure: BIOPSY;  Surgeon: Danie Binder, MD;  Location: [facility];  Service: Endoscopy;  Laterality: N/A;  Gastric and Esophageal Biopsies   CARPAL TUNNEL RELEASE  LEFT   CATARACT EXTRACTION W/PHACO Right 07/30/2016   Procedure: CATARACT EXTRACTION PHACO AND INTRAOCULAR LENS PLACEMENT ([place]);  Surgeon: Rutherford Guys, MD;  Location: [facility];  Service: Ophthalmology;  Laterality: Right;  CDE: 5.45   CATARACT EXTRACTION W/PHACO Left 08/13/2016   Procedure: CATARACT EXTRACTION PHACO AND INTRAOCULAR LENS PLACEMENT (IOC);  Surgeon: Rutherford Guys, MD;  Location: [facility];  Service: Ophthalmology;  Laterality: Left;  CDE: 5.59   COLON SURGERY     COLONOSCOPY  JAN 2009 DIARRHEA, ABD PAIN, BRBPR   ANASTOMOTIC ULCER(3MM), IH YO:VZCHYI ULCER, NL COLON bX   COLONOSCOPY WITH PROPOFOL N/A 04/21/2018   examined portion of the ileum normal, two 3-5 mm polyps, diverticulosis in the rectosigmoid and sigmoid colon, external and internal hemorrhoids, significant looping of the colon.  Pathology with tubular adenomas.  Recommendations to follow high-fiber diet and repeat colonoscopy in 5 to 10 years with peds  colonoscope.   DILATION AND CURETTAGE OF UTERUS     ESOPHAGEAL DILATION  12/24/2011   SLF: A stricture was found in the distal esophagus/ Moderate gastritis/ MILD Duodenitis   ESOPHAGOGASTRODUODENOSCOPY  02/07/09   mild gastritis   ESOPHAGOGASTRODUODENOSCOPY (EGD) WITH PROPOFOL N/A 06/30/2012   SLF: 1. No definite stricture appreciated 2. small hiatal hernia 3. Gastritis   ESOPHAGOGASTRODUODENOSCOPY (EGD) WITH PROPOFOL N/A 02/20/2016   Procedure: ESOPHAGOGASTRODUODENOSCOPY (EGD) WITH PROPOFOL;  Surgeon: Danie Binder, MD;  [facility];  Service: Endoscopy;  Laterality: N/A;  930   ESOPHAGOGASTRODUODENOSCOPY (EGD) WITH PROPOFOL N/A 11/24/2018   benign-appearing esophageal peptic stricture s/p dilation, small hiatal hernia, moderate erosive gastritis.    EYE SURGERY     gum flap Bilateral     HEMICOLECTOMY  RIGHT 2006   HERNIA REPAIR     LUMBAR DISC SURGERY     POLYPECTOMY  04/21/2018   Procedure: POLYPECTOMY;  Surgeon: Danie Binder, MD;  [facility];  Service: Endoscopy;;  (colon)   SAVORY DILATION N/A 06/30/2012   Procedure: SAVORY DILATION;  Surgeon: Danie Binder, MD;  Location: [facility];  Service: Endoscopy;  Laterality: N/A;  12.57m , 173m 1568m SAVORY DILATION N/A 02/20/2016   Procedure: SAVORY DILATION;  Surgeon: SanDanie BinderD;  [facility];  Service: Endoscopy;  Laterality: N/A;   SAVORY DILATION N/A 11/24/2018   Procedure: SAVORY DILATION;  Surgeon: FieDanie BinderD;  [facility];  Service: Endoscopy;  Laterality: N/A;   UPPER GASTROINTESTINAL ENDOSCOPY  JAN 2009 ABD PAIN   GASTRITIS, ESO RING   UPPER GASTROINTESTINAL ENDOSCOPY  OCT 2010 ABD PAIN, DYSPHAGIA   DIL 15MM, GASTRITIS, NL DUODENUM   UPPER GASTROINTESTINAL ENDOSCOPY  OCT 2010 DYSPHAGIA   DIL 17 MM, GASTRITIS, NL DUODENUM   vocal cord surgery  Sep 20, 2010   precancerous areas removed   wisdom tooth extraction Right    pin placement due to broken jaw   Patient Active Problem List   Diagnosis Date Noted   Encounter for general adult medical examination with abnormal findings 12/27/2021   Rash 08/14/2021   Hypothyroidism 12/13/2020   S/P lumbar fusion 10/19/2019   Seasonal allergies 09/01/2018   Chronic low back pain 04/07/2018   Perianal cyst 02/24/2018   Alcohol abuse 12/23/2017   Tobacco abuse 06/25/2017   Osteoarthritis 12/27/2014   Allergic dermatitis eyelid 12/27/2014   Abnormal CT scan, chest 01/12/2014   Polycythemia 09/08/2012   Vertigo 01/22/2012   Neck pain 01/22/2012   Chronic pain syndrome 12/27/2011   Diarrhea, episodic 12/27/2011   Edema of larynx 04/22/2011   Dysphagia 03/14/2011   DEPRESSION 04/24/2010   Chronic bronchitis ([facility]9/16/2011   Osteoporosis 11/11/2008   Spondylolisthesis, lumbar region 07/28/2008   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   DEGENERATIVE DISMortonSEASE, CERVICAL SPINE 06/29/2007   Hyperlipidemia 02/03/2007   VARCIES, SITE NEC 09/30/2006   Anxiety state 02/27/2006   Essential hypertension 02/27/2006   GERD 02/27/2006   Crohn's disease of small intestine with complication ([id number] PCP: RutIhor DowD  REFERRING PROVIDER: RutIhor DowD  REFERRING DIAG: M43.16 (ICD-10-CM) - Spondylolisthesis, lumbar region   Rationale for Evaluation and Treatment Rehabilitation  THERAPY DIAG:  Low back pain, unspecified back pain laterality, unspecified chronicity, unspecified whether sciatica present  Radiculopathy, lumbar region  Difficulty in walking, not elsewhere classified  Muscle weakness (generalized)  ONSET DATE: June 2023  SUBJECTIVE:  SUBJECTIVE STATEMENT: Pt reports she cancelled her last 3 appts due to sickness and her brother's death. States she is still upset over his passing. Reports she just took a pain pill and still has pinching pain around her tail bone and down her LE's.  STates sometimes it's her Rt and sometimes it's her Lt.  Painfree now seated at rest, however increases with movement/activity.  Admits to not being compliant with her exercises and feels she's weaker today.   PERTINENT HISTORY:  Chron's disease  PAIN:  Are you having pain? Yes: NPRS scale: 3/10 Pain location: back and Lt LE Pain description: sore Aggravating factors: sweeping, too much activity Relieving factors: medication   PRECAUTIONS: None  WEIGHT BEARING RESTRICTIONS No  FALLS:  Has patient fallen in last 6 months? No  LIVING ENVIRONMENT: Lives with: lives with their family and lives with their spouse Lives in: House/apartment Stairs: Yes: External: 4 steps; on right going  up Has following equipment at home: Single point cane walking stick  OCCUPATION: Does not work  PLOF: Independent  PATIENT GOALS Be able to wash her windows, clean house with less pain. Do some yard work.   OBJECTIVE:   DIAGNOSTIC FINDINGS:  IMPRESSION: 1. Status post L4-L5 fusion. No spinal canal stenosis. Susceptibility artifact from hardware obscures the neural foramina. 2. Mild left neural foraminal narrowing at L3-L4, and possibly at L5-S1.   On: 10/12/202  PATIENT SURVEYS:  FOTO 11/9: 36%   evaluation:  29%  SCREENING FOR RED FLAGS: Bowel or bladder incontinence: No Spinal tumors: No Cauda equina syndrome: No Compression fracture: No Abdominal aneurysm: No  COGNITION:  Overall cognitive status: Within functional limits for tasks assessed     SENSATION: Light touch: Impaired  Rt foot, reports hx of neuropathy    POSTURE: rounded shoulders, forward head, and increased thoracic kyphosis   LUMBAR ROM:   Active  A/PROM  eval A/PROM 03/14/22  Flexion Mid shin Mid shin (can't go further due to vertigo)  Extension Limited 50% WNL  Right lateral flexion Top of patella Mid shin  Left lateral flexion Past mid jointline Mid shin  Right rotation wfl WFL  Left rotation wfl WFL   (Blank rows = not tested)   LOWER EXTREMITY MMT:    MMT (Low effort) Right eval Right 03/14/22 Left eval Left 03/14/22  Hip flexion 4 4+ in supine 4 3 in supine  Hip extension 2 3+ in prone 2 3+ in prone  Hip abduction 4- 4- in sidelying 4- 3+in sidelying  Hip adduction 4 4 in sidelying 4 3 in sidelying  Hip internal rotation      Hip external rotation      Knee flexion 3 4+ in prone 3 4+ in prone  Knee extension 3+ _0 Ankle dorsiflexion 4- 4+ 4+ 5  Ankle plantarflexion      Ankle inversion      Ankle eversion       (Blank rows = not tested)  LUMBAR SPECIAL TESTS:  Slump test: Negative  FUNCTIONAL TESTS:  5 times sit to stand: 11/9:   15.2sec      at evaluation: 17.5  sec 2 minute walk test: 306 feet no AD  GAIT: Distance walked: 306 feet no AD Assistive device utilized: None Level of assistance: Complete Independence Comments: Very erratic; no consistent gait pattern, changing speed, veering, frequently, wide and narrow BOS but inconsistent throughout distance. Not ataxic.   TODAY'S TREATMENT  40/0/86 Progress note/certification MMT/ROM testing (see above)  FOTO 11/9: 36%   evaluation:  29% 5 times sit to stand: 11/9: 15.2sec   at evaluation: 17.5 sec  02/27/22 STS 2x10 no HHA eccentric control Standing:  Heel raise 2X10 Squat front of chair 2x10  Forward step ups 4" 10X with 1 UE assist Alternating high march 2X10 Seated piriformis stretch 30" each, hip flexor stretch 30" each  Leg extension hamstring stretch 20" each LE  02/25/22 STS 2x10 no HHA eccentric control Standing:  Heel raise 2X10 Squat front of chair 2x10  Lateral step ups 4" 2X10 with 1 UE assist Forward step ups 4" 2X10 with 1 UE assist  02/22/22: STS 2x10 no HHA eccentric control Standing: GTB  shoulder extension 2x10  Row 2x10  Scapular retraction 2x10 Heel raise 15x Squat front of chair 2x10 (cueing for mechanics) Lateral steps along wall 10' x 1RT Prone: hip extension 10x RLE Sidelying: Abduction 10x BLE  02/20/22: STS 10x no HHA eccentric control Standing: GTB  shoulder extension 10x  Row 10x  Scapular retraction 10x Heel raise 15x Squat front of chair 10x (cueing for mechanics)  Prone: hip extension 10x5" Sidelying: Abduction 10x BLE  02/13/22 Ab brace 10 x 5" Glute set 10 x5" Ab march x10 each LTR 5 x 10" Bridge x10   PATIENT EDUCATION:  Education details: findings, safety awareness, POC, PT role Person educated: Patient Education method: Explanation Education comprehension: verbalized understanding  HOME EXERCISE PROGRAM: Access Code: [email or web address] URL: [email or web address] 02/20/22: STS Hip abd Hip  extension  02/13/22 - Hooklying Gluteal Sets  - 1 x daily - 7 x weekly - 1 sets - 10 reps - 3-5 second hold  Date: 02/12/2022 Prepared by: [facility]  Exercises - Clamshell  - 1 x daily - 7 x weekly - 1 sets - 10 reps - Supine Lower Trunk Rotation  - 1 x daily - 7 x weekly - 1 sets - 10 reps - Supine Transversus Abdominis Bracing - Hands on Stomach  - 1 x daily - 7 x weekly - 1 sets - 10 reps - 5 sec hold - Supine Bridge  - 1 x daily - 7 x weekly - 1 sets - 10 reps - Supine March  - 1 x daily - 7 x weekly - 1 sets - 10 reps  ASSESSMENT:  CLINICAL IMPRESSION: Pt reassessed today for progress with good gains met towards goals.  Pt does admit to slacking off her HEP the last 2 weeks due to a family death.   Pt continues to have increased c/o discomfort in Lt hip and around into groin region that comes and goes and generalized weakness impairing her functional activities. Patient will continue to benefit from skilled physical therapy services to further improve independence with functional mobility.  OBJECTIVE IMPAIRMENTS Abnormal gait, decreased activity tolerance, decreased balance, decreased endurance, decreased knowledge of condition, decreased knowledge of use of DME, decreased mobility, difficulty walking, decreased ROM, decreased strength, decreased safety awareness, hypomobility, impaired flexibility, impaired sensation, improper body mechanics, postural dysfunction, and pain.   ACTIVITY LIMITATIONS carrying, lifting, bending, sitting, standing, squatting, stairs, and locomotion level  PARTICIPATION LIMITATIONS: cleaning, laundry, driving, shopping, community activity, and yard work  PERSONAL FACTORS Age, Behavior pattern, and Time since onset of injury/illness/exacerbation are also affecting patient's functional outcome.   REHAB POTENTIAL: Good  CLINICAL DECISION MAKING: Stable/uncomplicated  EVALUATION COMPLEXITY: Low   GOALS: Goals reviewed with patient? Yes  SHORT TERM  GOALS: Target date: 02/19/22  Patient will be independent with initial HEP and  self-management strategies to improve functional outcomes Baseline: Initiated Goal status: IN PROGRESS    LONG TERM GOALS: Target date: 03/12/22  Patient will be independent with advanced HEP and self-management strategies to improve functional outcomes Baseline:  Goal status: IN PROGRESS  2.  Patient will improve FOTO score to predicted value of 45% to indicate improvement in functional outcomes Baseline: 29% Goal status: IN PROGRESS  3.  Patient will report reduction of back pain to <3/10 for improved quality of life and ability to perform ADLs  Baseline: 8/10 Goal status: IN PROGRESS  4. Patient will have equal to or > 4+/5 MMT throughout BIL LEs to improve ability to perform functional mobility, stair ambulation and ADLs.  Baseline: See above Goal status: IN PROGRESS   PLAN: PT FREQUENCY: 2x/week  PT DURATION: 6 weeks  PLANNED INTERVENTIONS: Therapeutic exercises, Therapeutic activity, Neuromuscular re-education, Balance training, Gait training, Patient/Family education, Self Care, Joint mobilization, Joint manipulation, Stair training, DME instructions, Dry Needling, Electrical stimulation, Spinal manipulation, Spinal mobilization, Cryotherapy, Moist heat, Taping, Traction, Ultrasound, Ionotophoresis 82m/ml Dexamethasone, Manual therapy, and Re-evaluation.  PLAN FOR NEXT SESSION: Core and LE strengthening, gait and balance training as needed. Progress standing stability exercises. Continue X 5-6 more weeks.  ATeena Irani PTA/CLT CPrince GeorgePh: [phone] 11:25 AM, 03/14/22

## 2022-03-19 ENCOUNTER — Ambulatory Visit (HOSPITAL_COMMUNITY): Payer: Medicare Other | Admitting: Physical Therapy

## 2022-03-19 ENCOUNTER — Encounter (HOSPITAL_COMMUNITY): Payer: Self-pay | Admitting: Physical Therapy

## 2022-03-19 DIAGNOSIS — R262 Difficulty in walking, not elsewhere classified: Secondary | ICD-10-CM | POA: Diagnosis not present

## 2022-03-19 DIAGNOSIS — M6281 Muscle weakness (generalized): Secondary | ICD-10-CM

## 2022-03-19 DIAGNOSIS — M545 Low back pain, unspecified: Secondary | ICD-10-CM

## 2022-03-19 DIAGNOSIS — M5416 Radiculopathy, lumbar region: Secondary | ICD-10-CM | POA: Diagnosis not present

## 2022-03-19 NOTE — Therapy (Signed)
OUTPATIENT PHYSICAL THERAPY THORACOLUMBAR TREATMENT Progress Note Reporting Period 01/29/22 to 03/14/22  See note below for Objective Data and Assessment of Progress/Goals.       Patient Name: [name] [name] MRN: [id number] DOB:January 04, 1956, 67 y.o., female Today's Date: 03/19/2022   PT End of Session - 03/19/22 1437     Visit Number 9    Number of Visits 18    Date for PT Re-Evaluation 04/25/22    Authorization Type U[employer] Medicare (Medicaid [place] secondary)    Authorization Time Period (No auth, no LV)    Progress Note Due on Visit 18    PT Start Time 1434    PT Stop Time 1517    PT Time Calculation (min) 43 min    Activity Tolerance Patient tolerated treatment well    Behavior During Therapy 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   Crohn disease ([place])    CTS (carpal tunnel syndrome)    Diarrhea MULITIFACTORIAL   IBS, LACTOSE INTOLERANCE, SBBO, BILE-SALT   Elevated liver enzymes 2009: BMI 24 ?Etoh ALT 94, AST 51,  NEG ANA, qIGs& ASMA   MAY 2012 AST 52 ALT 38   Esophageal stricture 2009   GERD (gastroesophageal reflux disease)    Hemorrhoid    Hyperlipemia    Hypertension    Inflammatory bowel disease 2006 CD   SURGICAL REMISSION   LBP (low back pain)    Mental disorder    Migraine    Osteoporosis    PONV (postoperative nausea and vomiting)    Shortness of breath    exertion and humidity   Shoulder pain    right   Sleep apnea    Stop Bang Cock score of 4   Past Surgical History:  Procedure Laterality Date   BACK SURGERY     BACK SURGERY  07/16/2018   BILATERAL SALPINGOOPHORECTOMY     BIOPSY  12/24/2011   Procedure: BIOPSY;  Surgeon: Danie Binder, MD;  Location: [facility];  Service: Endoscopy;;  Gastric Biopsies   BIOPSY N/A  06/30/2012   Procedure: BIOPSY;  Surgeon: Danie Binder, MD;  Location: [facility];  Service: Endoscopy;  Laterality: N/A;  Gastric and Esophageal Biopsies   CARPAL TUNNEL RELEASE  LEFT   CATARACT EXTRACTION W/PHACO Right 07/30/2016   Procedure: CATARACT EXTRACTION PHACO AND INTRAOCULAR LENS PLACEMENT ([place]);  Surgeon: Rutherford Guys, MD;  Location: [facility];  Service: Ophthalmology;  Laterality: Right;  CDE: 5.45   CATARACT EXTRACTION W/PHACO Left 08/13/2016   Procedure: CATARACT EXTRACTION PHACO AND INTRAOCULAR LENS PLACEMENT (I[facility]);  Surgeon: Rutherford Guys, MD;  Location: [facility];  Service: Ophthalmology;  Laterality: Left;  CDE: 5.59   COLON SURGERY     COLONOSCOPY  JAN 2009 DIARRHEA, ABD PAIN, BRBPR   ANASTOMOTIC ULCER(3MM), IH GY:KZLDJT ULCER, NL COLON bX   COLONOSCOPY WITH PROPOFOL N/A 04/21/2018   examined portion of the ileum normal, two 3-5 mm polyps, diverticulosis in the rectosigmoid and sigmoid colon, external and internal hemorrhoids, significant looping of the colon.  Pathology with tubular adenomas.  Recommendations to follow high-fiber diet and repeat colonoscopy in 5 to 10 years with peds  colonoscope.   DILATION AND CURETTAGE OF UTERUS     ESOPHAGEAL DILATION  12/24/2011   SLF: A stricture was found in the distal esophagus/ Moderate gastritis/ MILD Duodenitis   ESOPHAGOGASTRODUODENOSCOPY  02/07/09   mild gastritis   ESOPHAGOGASTRODUODENOSCOPY (EGD) WITH PROPOFOL N/A 06/30/2012   SLF: 1. No definite stricture appreciated 2. small hiatal hernia 3. Gastritis   ESOPHAGOGASTRODUODENOSCOPY (EGD) WITH PROPOFOL N/A 02/20/2016   Procedure: ESOPHAGOGASTRODUODENOSCOPY (EGD) WITH PROPOFOL;  Surgeon: Danie Binder, MD;  [facility];  Service: Endoscopy;  Laterality: N/A;  930   ESOPHAGOGASTRODUODENOSCOPY (EGD) WITH PROPOFOL N/A 11/24/2018   benign-appearing esophageal peptic stricture s/p dilation, small hiatal hernia, moderate erosive gastritis.    EYE SURGERY     gum flap Bilateral     HEMICOLECTOMY  RIGHT 2006   HERNIA REPAIR     LUMBAR DISC SURGERY     POLYPECTOMY  04/21/2018   Procedure: POLYPECTOMY;  Surgeon: Danie Binder, MD;  [facility];  Service: Endoscopy;;  (colon)   SAVORY DILATION N/A 06/30/2012   Procedure: SAVORY DILATION;  Surgeon: Danie Binder, MD;  Location: [facility];  Service: Endoscopy;  Laterality: N/A;  12.63m , 122m 1588m SAVORY DILATION N/A 02/20/2016   Procedure: SAVORY DILATION;  Surgeon: SanDanie BinderD;  [facility];  Service: Endoscopy;  Laterality: N/A;   SAVORY DILATION N/A 11/24/2018   Procedure: SAVORY DILATION;  Surgeon: FieDanie BinderD;  [facility];  Service: Endoscopy;  Laterality: N/A;   UPPER GASTROINTESTINAL ENDOSCOPY  JAN 2009 ABD PAIN   GASTRITIS, ESO RING   UPPER GASTROINTESTINAL ENDOSCOPY  OCT 2010 ABD PAIN, DYSPHAGIA   DIL 15MM, GASTRITIS, NL DUODENUM   UPPER GASTROINTESTINAL ENDOSCOPY  OCT 2010 DYSPHAGIA   DIL 17 MM, GASTRITIS, NL DUODENUM   vocal cord surgery  Sep 20, 2010   precancerous areas removed   wisdom tooth extraction Right    pin placement due to broken jaw   Patient Active Problem List   Diagnosis Date Noted   Encounter for general adult medical examination with abnormal findings 12/27/2021   Rash 08/14/2021   Hypothyroidism 12/13/2020   S/P lumbar fusion 10/19/2019   Seasonal allergies 09/01/2018   Chronic low back pain 04/07/2018   Perianal cyst 02/24/2018   Alcohol abuse 12/23/2017   Tobacco abuse 06/25/2017   Osteoarthritis 12/27/2014   Allergic dermatitis eyelid 12/27/2014   Abnormal CT scan, chest 01/12/2014   Polycythemia 09/08/2012   Vertigo 01/22/2012   Neck pain 01/22/2012   Chronic pain syndrome 12/27/2011   Diarrhea, episodic 12/27/2011   Edema of larynx 04/22/2011   Dysphagia 03/14/2011   DEPRESSION 04/24/2010   Chronic bronchitis ([place]9/16/2011   Osteoporosis 11/11/2008   Spondylolisthesis, lumbar region 07/28/2008   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, CERVICAL SPINE 06/29/2007   Hyperlipidemia 02/03/2007   VARCIES, SITE NEC 09/30/2006   Anxiety state 02/27/2006   Essential hypertension 02/27/2006   GERD 02/27/2006   Crohn's disease of small intestine with complication ([id number] PCP: RutIhor DowD  REFERRING PROVIDER: RutIhor DowD  REFERRING DIAG: M43.16 (ICD-10-CM) - Spondylolisthesis, lumbar region   Rationale for Evaluation and Treatment Rehabilitation  THERAPY DIAG:  Low back pain, unspecified back pain laterality, unspecified chronicity, unspecified whether sciatica present  Difficulty in walking, not elsewhere classified  Muscle weakness (generalized)  ONSET DATE: June 2023  SUBJECTIVE:  SUBJECTIVE STATEMENT: Pt reports she cancelled her last 3 appts due to sickness and her brother's death. States she is still upset over his passing. Reports she just took a pain pill and still has pinching pain around her tail bone and down her LE's.  STates sometimes it's her Rt and sometimes it's her Lt.  Painfree now seated at rest, however increases with movement/activity.  Admits to not being compliant with her exercises and feels she's weaker today.   PERTINENT HISTORY:  Chron's disease  PAIN:  Are you having pain? Yes: NPRS scale: 3/10 Pain location: back and Lt LE Pain description: sore Aggravating factors: sweeping, too much activity Relieving factors: medication   PRECAUTIONS: None  WEIGHT BEARING RESTRICTIONS No  FALLS:  Has patient fallen in last 6 months? No  LIVING ENVIRONMENT: Lives with: lives with their family and lives with their spouse Lives in: House/apartment Stairs: Yes: External: 4 steps; on right going up Has following equipment at  home: Single point cane walking stick  OCCUPATION: Does not work  PLOF: Independent  PATIENT GOALS Be able to wash her windows, clean house with less pain. Do some yard work.   OBJECTIVE:   DIAGNOSTIC FINDINGS:  IMPRESSION: 1. Status post L4-L5 fusion. No spinal canal stenosis. Susceptibility artifact from hardware obscures the neural foramina. 2. Mild left neural foraminal narrowing at L3-L4, and possibly at L5-S1.   On: 10/12/202  PATIENT SURVEYS:  FOTO 11/9: 36%   evaluation:  29%  SCREENING FOR RED FLAGS: Bowel or bladder incontinence: No Spinal tumors: No Cauda equina syndrome: No Compression fracture: No Abdominal aneurysm: No  COGNITION:  Overall cognitive status: Within functional limits for tasks assessed     SENSATION: Light touch: Impaired  Rt foot, reports hx of neuropathy    POSTURE: rounded shoulders, forward head, and increased thoracic kyphosis   LUMBAR ROM:   Active  A/PROM  eval A/PROM 03/14/22  Flexion Mid shin Mid shin (can't go further due to vertigo)  Extension Limited 50% WNL  Right lateral flexion Top of patella Mid shin  Left lateral flexion Past mid jointline Mid shin  Right rotation wfl WFL  Left rotation wfl WFL   (Blank rows = not tested)   LOWER EXTREMITY MMT:    MMT (Low effort) Right eval Right 03/14/22 Left eval Left 03/14/22  Hip flexion 4 4+ in supine 4 3 in supine  Hip extension 2 3+ in prone 2 3+ in prone  Hip abduction 4- 4- in sidelying 4- 3+in sidelying  Hip adduction 4 4 in sidelying 4 3 in sidelying  Hip internal rotation      Hip external rotation      Knee flexion 3 4+ in prone 3 4+ in prone  Knee extension 3+ 5 5 5   Ankle dorsiflexion 4- 4+ 4+ 5  Ankle plantarflexion      Ankle inversion      Ankle eversion       (Blank rows = not tested)  LUMBAR SPECIAL TESTS:  Slump test: Negative  FUNCTIONAL TESTS:  5 times sit to stand: 11/9:   15.2sec      at evaluation: 17.5 sec 2 minute walk test: 306  feet no AD  GAIT: Distance walked: 306 feet no AD Assistive device utilized: None Level of assistance: Complete Independence Comments: Very erratic; no consistent gait pattern, changing speed, veering, frequently, wide and narrow BOS but inconsistent throughout distance. Not ataxic.   TODAY'S TREATMENT  03/19/22 STS 2x10 no HHA eccentric control  Standing:  Heel raise 2X10 Squat front of chair 2# DB 3x10  Alternating high march forward 3X10 Seated LAQ 3# AW 3x10 Standing HS curl 3# 2x10  34/7/42 Progress note/certification MMT/ROM testing (see above) FOTO 11/9: 36%   evaluation:  29% 5 times sit to stand: 11/9: 15.2sec   at evaluation: 17.5 sec  02/27/22 STS 2x10 no HHA eccentric control Standing:  Heel raise 2X10 Squat front of chair 2x10  Forward step ups 4" 10X with 1 UE assist Alternating high march 2X10 Seated piriformis stretch 30" each, hip flexor stretch 30" each  Leg extension hamstring stretch 20" each LE    PATIENT EDUCATION:  Education details: findings, safety awareness, POC, PT role Person educated: Patient Education method: Explanation Education comprehension: verbalized understanding  HOME EXERCISE PROGRAM: Access Code: [email or web address] URL: [email or web address] 02/20/22: STS Hip abd Hip extension  02/13/22 - Hooklying Gluteal Sets  - 1 x daily - 7 x weekly - 1 sets - 10 reps - 3-5 second hold  Date: 02/12/2022 Prepared by: [facility] - [facility]hab  Exercises - Clamshell  - 1 x daily - 7 x weekly - 1 sets - 10 reps - Supine Lower Trunk Rotation  - 1 x daily - 7 x weekly - 1 sets - 10 reps - Supine Transversus Abdominis Bracing - Hands on Stomach  - 1 x daily - 7 x weekly - 1 sets - 10 reps - 5 sec hold - Supine Bridge  - 1 x daily - 7 x weekly - 1 sets - 10 reps - Supine March  - 1 x daily - 7 x weekly - 1 sets - 10 reps  ASSESSMENT:  CLINICAL IMPRESSION: Requires frequent cues for redirection and focus on activities. Performed LE exercises  with emphasis on core engagement, stability, and motor control. Patient will continue to benefit from skilled physical therapy services to further improve independence with functional mobility.   OBJECTIVE IMPAIRMENTS Abnormal gait, decreased activity tolerance, decreased balance, decreased endurance, decreased knowledge of condition, decreased knowledge of use of DME, decreased mobility, difficulty walking, decreased ROM, decreased strength, decreased safety awareness, hypomobility, impaired flexibility, impaired sensation, improper body mechanics, postural dysfunction, and pain.   ACTIVITY LIMITATIONS carrying, lifting, bending, sitting, standing, squatting, stairs, and locomotion level  PARTICIPATION LIMITATIONS: cleaning, laundry, driving, shopping, community activity, and yard work  PERSONAL FACTORS Age, Behavior pattern, and Time since onset of injury/illness/exacerbation are also affecting patient's functional outcome.   REHAB POTENTIAL: Good  CLINICAL DECISION MAKING: Stable/uncomplicated  EVALUATION COMPLEXITY: Low   GOALS: Goals reviewed with patient? Yes  SHORT TERM GOALS: Target date: 02/19/22  Patient will be independent with initial HEP and self-management strategies to improve functional outcomes Baseline: Initiated Goal status: IN PROGRESS    LONG TERM GOALS: Target date: 03/12/22  Patient will be independent with advanced HEP and self-management strategies to improve functional outcomes Baseline:  Goal status: IN PROGRESS  2.  Patient will improve FOTO score to predicted value of 45% to indicate improvement in functional outcomes Baseline: 29% Goal status: IN PROGRESS  3.  Patient will report reduction of back pain to <3/10 for improved quality of life and ability to perform ADLs  Baseline: 8/10 Goal status: IN PROGRESS  4. Patient will have equal to or > 4+/5 MMT throughout BIL LEs to improve ability to perform functional mobility, stair ambulation and ADLs.   Baseline: See above Goal status: IN PROGRESS   PLAN: PT FREQUENCY: 2x/week  PT DURATION: 6 weeks  PLANNED INTERVENTIONS: Therapeutic exercises,  Therapeutic activity, Neuromuscular re-education, Balance training, Gait training, Patient/Family education, Self Care, Joint mobilization, Joint manipulation, Stair training, DME instructions, Dry Needling, Electrical stimulation, Spinal manipulation, Spinal mobilization, Cryotherapy, Moist heat, Taping, Traction, Ultrasound, Ionotophoresis 34m/ml Dexamethasone, Manual therapy, and Re-evaluation.  PLAN FOR NEXT SESSION: Core and LE strengthening, gait and balance training as needed. Progress standing stability exercises. Continue X 5-6 more weeks.  LCandie Mile PT, DPT Physical Therapist [facility]  3:19 PM, 03/19/22

## 2022-03-21 ENCOUNTER — Encounter (HOSPITAL_COMMUNITY): Payer: Medicare Other

## 2022-03-26 ENCOUNTER — Encounter (HOSPITAL_COMMUNITY): Payer: Medicare Other | Admitting: Physical Therapy

## 2022-03-27 ENCOUNTER — Ambulatory Visit (HOSPITAL_COMMUNITY): Payer: Medicare Other | Admitting: Physical Therapy

## 2022-03-27 DIAGNOSIS — M6281 Muscle weakness (generalized): Secondary | ICD-10-CM

## 2022-03-27 DIAGNOSIS — R262 Difficulty in walking, not elsewhere classified: Secondary | ICD-10-CM | POA: Diagnosis not present

## 2022-03-27 DIAGNOSIS — M5416 Radiculopathy, lumbar region: Secondary | ICD-10-CM | POA: Diagnosis not present

## 2022-03-27 DIAGNOSIS — M545 Low back pain, unspecified: Secondary | ICD-10-CM | POA: Diagnosis not present

## 2022-03-27 NOTE — Therapy (Signed)
OUTPATIENT PHYSICAL THERAPY THORACOLUMBAR TREATMENT      Patient Name: [name] [name] MRN: [id number] DOB:1955/08/03, 66 y.o., female Today's Date: 03/19/2022   PT End of Session - 03/19/22 1437     Visit Number 9    Number of Visits 18    Date for PT Re-Evaluation 04/25/22    Authorization Type U[employer] Medicare (Medicaid [place] secondary)    Authorization Time Period (No auth, no LV)    Progress Note Due on Visit 18    PT Start Time 1434    PT Stop Time 1517    PT Time Calculation (min) 43 min    Activity Tolerance Patient tolerated treatment well    Behavior During Therapy [facility] for tasks assessed/performed                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   Crohn disease ([place])    CTS (carpal tunnel syndrome)    Diarrhea MULITIFACTORIAL   IBS, LACTOSE INTOLERANCE, SBBO, BILE-SALT   Elevated liver enzymes 2009: BMI 24 ?Etoh ALT 94, AST 51,  NEG ANA, qIGs& ASMA   MAY 2012 AST 52 ALT 38   Esophageal stricture 2009   GERD (gastroesophageal reflux disease)    Hemorrhoid    Hyperlipemia    Hypertension    Inflammatory bowel disease 2006 CD   SURGICAL REMISSION   LBP (low back pain)    Mental disorder    Migraine    Osteoporosis    PONV (postoperative nausea and vomiting)    Shortness of breath    exertion and humidity   Shoulder pain    right   Sleep apnea    Stop Bang Cock score of 4   Past Surgical History:  Procedure Laterality Date   BACK SURGERY     BACK SURGERY  07/16/2018   BILATERAL SALPINGOOPHORECTOMY     BIOPSY  12/24/2011   Procedure: BIOPSY;  Surgeon: Danie Binder, MD;  Location: [facility];  Service: Endoscopy;;  Gastric Biopsies   BIOPSY N/A 06/30/2012   Procedure: BIOPSY;  Surgeon: Danie Binder, MD;  Location: [facility];  Service: Endoscopy;  Laterality: N/A;  Gastric  and Esophageal Biopsies   CARPAL TUNNEL RELEASE  LEFT   CATARACT EXTRACTION W/PHACO Right 07/30/2016   Procedure: CATARACT EXTRACTION PHACO AND INTRAOCULAR LENS PLACEMENT ([place]);  Surgeon: Rutherford Guys, MD;  Location: [facility];  Service: Ophthalmology;  Laterality: Right;  CDE: 5.45   CATARACT EXTRACTION W/PHACO Left 08/13/2016   Procedure: CATARACT EXTRACTION PHACO AND INTRAOCULAR LENS PLACEMENT ([facility]);  Surgeon: Rutherford Guys, MD;  Location: [facility];  Service: Ophthalmology;  Laterality: Left;  CDE: 5.59   COLON SURGERY     COLONOSCOPY  JAN 2009 DIARRHEA, ABD PAIN, BRBPR   ANASTOMOTIC ULCER(3MM), IH QP:YPPJKD ULCER, NL COLON bX   COLONOSCOPY WITH PROPOFOL N/A 04/21/2018   examined portion of the ileum normal, two 3-5 mm polyps, diverticulosis in the rectosigmoid and sigmoid colon, external and internal hemorrhoids, significant looping of the colon.  Pathology with tubular adenomas.  Recommendations to follow high-fiber diet and repeat colonoscopy in 5 to 10 years with peds colonoscope.   DILATION AND CURETTAGE OF UTERUS     ESOPHAGEAL DILATION  12/24/2011   SLF:  A stricture was found in the distal esophagus/ Moderate gastritis/ MILD Duodenitis   ESOPHAGOGASTRODUODENOSCOPY  02/07/09   mild gastritis   ESOPHAGOGASTRODUODENOSCOPY (EGD) WITH PROPOFOL N/A 06/30/2012   SLF: 1. No definite stricture appreciated 2. small hiatal hernia 3. Gastritis   ESOPHAGOGASTRODUODENOSCOPY (EGD) WITH PROPOFOL N/A 02/20/2016   Procedure: ESOPHAGOGASTRODUODENOSCOPY (EGD) WITH PROPOFOL;  Surgeon: Danie Binder, MD;  [facility];  Service: Endoscopy;  Laterality: N/A;  930   ESOPHAGOGASTRODUODENOSCOPY (EGD) WITH PROPOFOL N/A 11/24/2018   benign-appearing esophageal peptic stricture s/p dilation, small hiatal hernia, moderate erosive gastritis.    EYE SURGERY     gum flap Bilateral    HEMICOLECTOMY  RIGHT 2006   HERNIA REPAIR     LUMBAR DISC SURGERY     POLYPECTOMY  04/21/2018   Procedure: POLYPECTOMY;  Surgeon:  Danie Binder, MD;  [facility];  Service: Endoscopy;;  (colon)   SAVORY DILATION N/A 06/30/2012   Procedure: SAVORY DILATION;  Surgeon: Danie Binder, MD;  Location: [facility];  Service: Endoscopy;  Laterality: N/A;  12.58m , 155m 1590m SAVORY DILATION N/A 02/20/2016   Procedure: SAVORY DILATION;  Surgeon: SanDanie BinderD;  [facility];  Service: Endoscopy;  Laterality: N/A;   SAVORY DILATION N/A 11/24/2018   Procedure: SAVORY DILATION;  Surgeon: FieDanie BinderD;  [facility];  Service: Endoscopy;  Laterality: N/A;   UPPER GASTROINTESTINAL ENDOSCOPY  JAN 2009 ABD PAIN   GASTRITIS, ESO RING   UPPER GASTROINTESTINAL ENDOSCOPY  OCT 2010 ABD PAIN, DYSPHAGIA   DIL 15MM, GASTRITIS, NL DUODENUM   UPPER GASTROINTESTINAL ENDOSCOPY  OCT 2010 DYSPHAGIA   DIL 17 MM, GASTRITIS, NL DUODENUM   vocal cord surgery  Sep 20, 2010   precancerous areas removed   wisdom tooth extraction Right    pin placement due to broken jaw   Patient Active Problem List   Diagnosis Date Noted   Encounter for general adult medical examination with abnormal findings 12/27/2021   Rash 08/14/2021   Hypothyroidism 12/13/2020   S/P lumbar fusion 10/19/2019   Seasonal allergies 09/01/2018   Chronic low back pain 04/07/2018   Perianal cyst 02/24/2018   Alcohol abuse 12/23/2017   Tobacco abuse 06/25/2017   Osteoarthritis 12/27/2014   Allergic dermatitis eyelid 12/27/2014   Abnormal CT scan, chest 01/12/2014   Polycythemia 09/08/2012   Vertigo 01/22/2012   Neck pain 01/22/2012   Chronic pain syndrome 12/27/2011   Diarrhea, episodic 12/27/2011   Edema of larynx 04/22/2011   Dysphagia 03/14/2011   DEPRESSION 04/24/2010   Ch[place] bronchitis [place]/16/[place]   Osteoporosis 11/11/2008   Spondylolisthesis, lumbar region 07/28/2008   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/2007   DEGENERATIVE DISCloverSEASE, CERVICAL SPINE 06/29/2007   Hyperlipidemia 02/03/2007   VARCIES, SITE  NEC 09/30/2006   Anxiety state 02/27/2006   Essential hypertension 02/27/2006   GERD 02/27/2006   Crohn's disease of small intestine with complication ([id number] PCP: RutIhor DowD  REFERRING PROVIDER: RutIhor DowD  REFERRING DIAG: M43.16 (ICD-10-CM) - Spondylolisthesis, lumbar region   Rationale for Evaluation and Treatment Rehabilitation  THERAPY DIAG:  Low back pain, unspecified back pain laterality, unspecified chronicity, unspecified whether sciatica present  Difficulty in walking, not elsewhere classified  Muscle weakness (generalized)  ONSET DATE: June 2023  SUBJECTIVE:  SUBJECTIVE STATEMENT: Pt reports the weight added last session did not agree with her and felt like she shouldn't have done the extra set (her choice). Points to Lt hip adductor as the area that was hurting.  States it is all better today there but hurting in the back of her Rt LE down into her Rt calf/shin.  Reports she's been doing more arm exercises than leg this past week.   PERTINENT HISTORY:  Chron's disease  PAIN:  Are you having pain? Yes: NPRS scale: 3/10 Pain location: back and Lt LE Pain description: sore Aggravating factors: sweeping, too much activity Relieving factors: medication   PRECAUTIONS: None  WEIGHT BEARING RESTRICTIONS No  FALLS:  Has patient fallen in last 6 months? No  LIVING ENVIRONMENT: Lives with: lives with their family and lives with their spouse Lives in: House/apartment Stairs: Yes: External: 4 steps; on right going up Has following equipment at home: Single point cane walking stick  OCCUPATION: Does not work  PLOF: Independent  PATIENT GOALS Be able to wash her windows, clean house with less pain. Do some yard work.   OBJECTIVE:   DIAGNOSTIC FINDINGS:   IMPRESSION: 1. Status post L4-L5 fusion. No spinal canal stenosis. Susceptibility artifact from hardware obscures the neural foramina. 2. Mild left neural foraminal narrowing at L3-L4, and possibly at L5-S1.   On: 10/12/202  PATIENT SURVEYS:  FOTO 11/9: 36%   evaluation:  29%  SCREENING FOR RED FLAGS: Bowel or bladder incontinence: No Spinal tumors: No Cauda equina syndrome: No Compression fracture: No Abdominal aneurysm: No  COGNITION:  Overall cognitive status: Within functional limits for tasks assessed     SENSATION: Light touch: Impaired  Rt foot, reports hx of neuropathy    POSTURE: rounded shoulders, forward head, and increased thoracic kyphosis   LUMBAR ROM:   Active  A/PROM  eval A/PROM 03/14/22  Flexion Mid shin Mid shin (can't go further due to vertigo)  Extension Limited 50% WNL  Right lateral flexion Top of patella Mid shin  Left lateral flexion Past mid jointline Mid shin  Right rotation wfl WFL  Left rotation wfl WFL   (Blank rows = not tested)   LOWER EXTREMITY MMT:    MMT (Low effort) Right eval Right 03/14/22 Left eval Left 03/14/22  Hip flexion 4 4+ in supine 4 3 in supine  Hip extension 2 3+ in prone 2 3+ in prone  Hip abduction 4- 4- in sidelying 4- 3+in sidelying  Hip adduction 4 4 in sidelying 4 3 in sidelying  Hip internal rotation      Hip external rotation      Knee flexion 3 4+ in prone 3 4+ in prone  Knee extension 3+ 5 5 5   Ankle dorsiflexion 4- 4+ 4+ 5  Ankle plantarflexion      Ankle inversion      Ankle eversion       (Blank rows = not tested)  LUMBAR SPECIAL TESTS:  Slump test: Negative  FUNCTIONAL TESTS:  5 times sit to stand: 11/9:   15.2sec      at evaluation: 17.5 sec 2 minute walk test: 306 feet no AD  GAIT: Distance walked: 306 feet no AD Assistive device utilized: None Level of assistance: Complete Independence Comments: Very erratic; no consistent gait pattern, changing speed, veering, frequently, wide  and narrow BOS but inconsistent throughout distance. Not ataxic.   TODAY'S TREATMENT  03/27/22 STS X10 no HHA eccentric control Standing: heelraises 20X  Squats no added weight  10X  Alternating high march holds 10X   03/19/22 STS 2x10 no HHA eccentric control Standing:  Heel raise 2X10 Squat front of chair 2# DB 3x10  Alternating high march forward 3X10 Seated LAQ 3# AW 3x10 Standing HS curl 3# 2x10  01/12/82 Progress note/certification MMT/ROM testing (see above) FOTO 11/9: 36%   evaluation:  29% 5 times sit to stand: 11/9: 15.2sec   at evaluation: 17.5 sec  02/27/22 STS 2x10 no HHA eccentric control Standing:  Heel raise 2X10 Squat front of chair 2x10  Forward step ups 4" 10X with 1 UE assist Alternating high march 2X10 Seated piriformis stretch 30" each, hip flexor stretch 30" each  Leg extension hamstring stretch 20" each LE    PATIENT EDUCATION:  Education details: findings, safety awareness, POC, PT role Person educated: Patient Education method: Explanation Education comprehension: verbalized understanding  HOME EXERCISE PROGRAM: Access Code: [email or web address] URL: [email or web address] 02/20/22: STS Hip abd Hip extension  02/13/22 - Hooklying Gluteal Sets  - 1 x daily - 7 x weekly - 1 sets - 10 reps - 3-5 second hold  Date: 02/12/2022 Prepared by: [facility] - [facility]hab  Exercises - Clamshell  - 1 x daily - 7 x weekly - 1 sets - 10 reps - Supine Lower Trunk Rotation  - 1 x daily - 7 x weekly - 1 sets - 10 reps - Supine Transversus Abdominis Bracing - Hands on Stomach  - 1 x daily - 7 x weekly - 1 sets - 10 reps - 5 sec hold - Supine Bridge  - 1 x daily - 7 x weekly - 1 sets - 10 reps - Supine March  - 1 x daily - 7 x weekly - 1 sets - 10 reps  ASSESSMENT:  CLINICAL IMPRESSION: Pt discussed her symptoms for most of the session with difficulty redirecting to remain on task with therex.  Minimal activity completed due to this today. PT began limping  toward end of session c/o Rt knee pain that was not prevalent at beginning of session.  Max cues for redirection and focus on activities. Encouraged pt to complete more LE exercises at home rather than UE as this is what she needs more.   Patient will continue to benefit from skilled physical therapy services to further improve independence with functional mobility.   OBJECTIVE IMPAIRMENTS Abnormal gait, decreased activity tolerance, decreased balance, decreased endurance, decreased knowledge of condition, decreased knowledge of use of DME, decreased mobility, difficulty walking, decreased ROM, decreased strength, decreased safety awareness, hypomobility, impaired flexibility, impaired sensation, improper body mechanics, postural dysfunction, and pain.   ACTIVITY LIMITATIONS carrying, lifting, bending, sitting, standing, squatting, stairs, and locomotion level  PARTICIPATION LIMITATIONS: cleaning, laundry, driving, shopping, community activity, and yard work  PERSONAL FACTORS Age, Behavior pattern, and Time since onset of injury/illness/exacerbation are also affecting patient's functional outcome.   REHAB POTENTIAL: Good  CLINICAL DECISION MAKING: Stable/uncomplicated  EVALUATION COMPLEXITY: Low   GOALS: Goals reviewed with patient? Yes  SHORT TERM GOALS: Target date: 02/19/22  Patient will be independent with initial HEP and self-management strategies to improve functional outcomes Baseline: Initiated Goal status: IN PROGRESS    LONG TERM GOALS: Target date: 03/12/22  Patient will be independent with advanced HEP and self-management strategies to improve functional outcomes Baseline:  Goal status: IN PROGRESS  2.  Patient will improve FOTO score to predicted value of 45% to indicate improvement in functional outcomes Baseline: 29% Goal status: IN PROGRESS  3.  Patient will report reduction of back  pain to <3/10 for improved quality of life and ability to perform ADLs  Baseline:  8/10 Goal status: IN PROGRESS  4. Patient will have equal to or > 4+/5 MMT throughout BIL LEs to improve ability to perform functional mobility, stair ambulation and ADLs.  Baseline: See above Goal status: IN PROGRESS   PLAN: PT FREQUENCY: 2x/week  PT DURATION: 6 weeks  PLANNED INTERVENTIONS: Therapeutic exercises, Therapeutic activity, Neuromuscular re-education, Balance training, Gait training, Patient/Family education, Self Care, Joint mobilization, Joint manipulation, Stair training, DME instructions, Dry Needling, Electrical stimulation, Spinal manipulation, Spinal mobilization, Cryotherapy, Moist heat, Taping, Traction, Ultrasound, Ionotophoresis 55m/ml Dexamethasone, Manual therapy, and Re-evaluation.  PLAN FOR NEXT SESSION: Core and LE strengthening, gait and balance training as needed. Progress standing stability exercises.   ATeena Irani PTA/CLT CRockwell CityPh: [phone] 3:19 PM, 03/19/22

## 2022-04-02 DIAGNOSIS — M5442 Lumbago with sciatica, left side: Secondary | ICD-10-CM | POA: Diagnosis not present

## 2022-04-02 DIAGNOSIS — M4326 Fusion of spine, lumbar region: Secondary | ICD-10-CM | POA: Diagnosis not present

## 2022-04-02 DIAGNOSIS — M549 Dorsalgia, unspecified: Secondary | ICD-10-CM | POA: Diagnosis not present

## 2022-04-02 DIAGNOSIS — G894 Chronic pain syndrome: Secondary | ICD-10-CM | POA: Diagnosis not present

## 2022-04-02 DIAGNOSIS — Z79899 Other long term (current) drug therapy: Secondary | ICD-10-CM | POA: Diagnosis not present

## 2022-04-03 ENCOUNTER — Encounter (HOSPITAL_COMMUNITY): Payer: Self-pay

## 2022-04-03 ENCOUNTER — Ambulatory Visit (HOSPITAL_COMMUNITY): Payer: Medicare Other

## 2022-04-03 DIAGNOSIS — M6281 Muscle weakness (generalized): Secondary | ICD-10-CM | POA: Diagnosis not present

## 2022-04-03 DIAGNOSIS — R262 Difficulty in walking, not elsewhere classified: Secondary | ICD-10-CM | POA: Diagnosis not present

## 2022-04-03 DIAGNOSIS — M545 Low back pain, unspecified: Secondary | ICD-10-CM

## 2022-04-03 DIAGNOSIS — M5416 Radiculopathy, lumbar region: Secondary | ICD-10-CM | POA: Diagnosis not present

## 2022-04-03 NOTE — Therapy (Signed)
OUTPATIENT PHYSICAL THERAPY THORACOLUMBAR TREATMENT      Patient Name: [name] [name] MRN: [id number] DOB:10/13/1955, 66 y.o., female Today's Date: 04/03/2022   PT End of Session - 04/03/22 1503     Visit Number 11    Number of Visits 18    Date for PT Re-Evaluation 04/25/22    Authorization Type [employer] Medicare (Medicaid [place] secondary)    Authorization Time Period (No auth, no LV)    Progress Note Due on Visit 18    PT Start Time 1433    PT Stop Time 1500    PT Time Calculation (min) 27 min    Activity Tolerance Patient tolerated treatment well    Behavior During Therapy [facility] for tasks assessed/performed                 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   Crohn disease ([place])    CTS (carpal tunnel syndrome)    Diarrhea MULITIFACTORIAL   IBS, LACTOSE INTOLERANCE, SBBO, BILE-SALT   Elevated liver enzymes 2009: BMI 24 ?Etoh ALT 94, AST 51,  NEG ANA, qIGs& ASMA   MAY 2012 AST 52 ALT 38   Esophageal stricture 2009   GERD (gastroesophageal reflux disease)    Hemorrhoid    Hyperlipemia    Hypertension    Inflammatory bowel disease 2006 CD   SURGICAL REMISSION   LBP (low back pain)    Mental disorder    Migraine    Osteoporosis    PONV (postoperative nausea and vomiting)    Shortness of breath    exertion and humidity   Shoulder pain    right   Sleep apnea    Stop Bang Cock score of 4   Past Surgical History:  Procedure Laterality Date   BACK SURGERY     BACK SURGERY  07/16/2018   BILATERAL SALPINGOOPHORECTOMY     BIOPSY  12/24/2011   Procedure: BIOPSY;  Surgeon: Danie Binder, MD;  Location: [facility];  Service: Endoscopy;;  Gastric Biopsies   BIOPSY N/A 06/30/2012   Procedure: BIOPSY;  Surgeon: Danie Binder, MD;  Location: [facility];  Service: Endoscopy;  Laterality: N/A;   Gastric and Esophageal Biopsies   CARPAL TUNNEL RELEASE  LEFT   CATARACT EXTRACTION W/PHACO Right 07/30/2016   Procedure: CATARACT EXTRACTION PHACO AND INTRAOCULAR LENS PLACEMENT ([place]);  Surgeon: Rutherford Guys, MD;  Location: [facility];  Service: Ophthalmology;  Laterality: Right;  CDE: 5.45   CATARACT EXTRACTION W/PHACO Left 08/13/2016   Procedure: CATARACT EXTRACTION PHACO AND INTRAOCULAR LENS PLACEMENT (IOC);  Surgeon: Rutherford Guys, MD;  Location: [facility];  Service: Ophthalmology;  Laterality: Left;  CDE: 5.59   COLON SURGERY     COLONOSCOPY  JAN 2009 DIARRHEA, ABD PAIN, BRBPR   ANASTOMOTIC ULCER(3MM), IH RD:EYCXKG ULCER, NL COLON bX   COLONOSCOPY WITH PROPOFOL N/A 04/21/2018   examined portion of the ileum normal, two 3-5 mm polyps, diverticulosis in the rectosigmoid and sigmoid colon, external and internal hemorrhoids, significant looping of the colon.  Pathology with tubular adenomas.  Recommendations to follow high-fiber diet and repeat colonoscopy in 5 to 10 years with peds colonoscope.   DILATION AND CURETTAGE OF UTERUS     ESOPHAGEAL DILATION  12/24/2011  SLF: A stricture was found in the distal esophagus/ Moderate gastritis/ MILD Duodenitis   ESOPHAGOGASTRODUODENOSCOPY  02/07/09   mild gastritis   ESOPHAGOGASTRODUODENOSCOPY (EGD) WITH PROPOFOL N/A 06/30/2012   SLF: 1. No definite stricture appreciated 2. small hiatal hernia 3. Gastritis   ESOPHAGOGASTRODUODENOSCOPY (EGD) WITH PROPOFOL N/A 02/20/2016   Procedure: ESOPHAGOGASTRODUODENOSCOPY (EGD) WITH PROPOFOL;  Surgeon: Danie Binder, MD;  [facility];  Service: Endoscopy;  Laterality: N/A;  930   ESOPHAGOGASTRODUODENOSCOPY (EGD) WITH PROPOFOL N/A 11/24/2018   benign-appearing esophageal peptic stricture s/p dilation, small hiatal hernia, moderate erosive gastritis.    EYE SURGERY     gum flap Bilateral    HEMICOLECTOMY  RIGHT 2006   HERNIA REPAIR     LUMBAR DISC SURGERY     POLYPECTOMY  04/21/2018   Procedure: POLYPECTOMY;   Surgeon: Danie Binder, MD;  [facility];  Service: Endoscopy;;  (colon)   SAVORY DILATION N/A 06/30/2012   Procedure: SAVORY DILATION;  Surgeon: Danie Binder, MD;  Location: [facility];  Service: Endoscopy;  Laterality: N/A;  12.45m , 150m 1573m SAVORY DILATION N/A 02/20/2016   Procedure: SAVORY DILATION;  Surgeon: SanDanie BinderD;  [facility];  Service: Endoscopy;  Laterality: N/A;   SAVORY DILATION N/A 11/24/2018   Procedure: SAVORY DILATION;  Surgeon: FieDanie BinderD;  [facility];  Service: Endoscopy;  Laterality: N/A;   UPPER GASTROINTESTINAL ENDOSCOPY  JAN 2009 ABD PAIN   GASTRITIS, ESO RING   UPPER GASTROINTESTINAL ENDOSCOPY  OCT 2010 ABD PAIN, DYSPHAGIA   DIL 15MM, GASTRITIS, NL DUODENUM   UPPER GASTROINTESTINAL ENDOSCOPY  OCT 2010 DYSPHAGIA   DIL 17 MM, GASTRITIS, NL DUODENUM   vocal cord surgery  Sep 20, 2010   precancerous areas removed   wisdom tooth extraction Right    pin placement due to broken jaw   Patient Active Problem List   Diagnosis Date Noted   Encounter for general adult medical examination with abnormal findings 12/27/2021   Rash 08/14/2021   Hypothyroidism 12/13/2020   S/P lumbar fusion 10/19/2019   Seasonal allergies 09/01/2018   Chronic low back pain 04/07/2018   Perianal cyst 02/24/2018   Alcohol abuse 12/23/2017   Tobacco abuse 06/25/2017   Osteoarthritis 12/27/2014   Allergic dermatitis eyelid 12/27/2014   Abnormal CT scan, chest 01/12/2014   Polycythemia 09/08/2012   Vertigo 01/22/2012   Neck pain 01/22/2012   Chronic pain syndrome 12/27/2011   Diarrhea, episodic 12/27/2011   Edema of larynx 04/22/2011   Dysphagia 03/14/2011   DEPRESSION 04/24/2010   Chronic bronchitis ([facility]9/16/2011   Osteoporosis 11/11/2008   Spondylolisthesis, lumbar region 07/28/2008   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/2007   DEGENERATIVE DISObertSEASE, CERVICAL SPINE 06/29/2007   Hyperlipidemia 02/03/2007    VARCIES, SITE NEC 09/30/2006   Anxiety state 02/27/2006   Essential hypertension 02/27/2006   GERD 02/27/2006   Crohn's disease of small intestine with complication ([id number] PCP: RutIhor DowD  REFERRING PROVIDER: RutIhor DowD  REFERRING DIAG: M43.16 (ICD-10-CM) - Spondylolisthesis, lumbar region   Rationale for Evaluation and Treatment Rehabilitation  THERAPY DIAG:  Low back pain, unspecified back pain laterality, unspecified chronicity, unspecified whether sciatica present  Difficulty in walking, not elsewhere classified  Muscle weakness (generalized)  Radiculopathy, lumbar region  ONSET DATE: June 2023  SUBJECTIVE:  SUBJECTIVE STATEMENT: Pt stated she has had 4 deaths in family and increased stress today.  Stated she was sore 2 sessions ago following the weight, stated she shouldn't have done the extra set (her choice)  PERTINENT HISTORY:  Chron's disease  PAIN:  Are you having pain? Yes: NPRS scale: 3/10 Pain location: back and Lt LE Pain description: sore Aggravating factors: sweeping, too much activity Relieving factors: medication   PRECAUTIONS: None  WEIGHT BEARING RESTRICTIONS No  FALLS:  Has patient fallen in last 6 months? No  LIVING ENVIRONMENT: Lives with: lives with their family and lives with their spouse Lives in: House/apartment Stairs: Yes: External: 4 steps; on right going up Has following equipment at home: Single point cane walking stick  OCCUPATION: Does not work  PLOF: Independent  PATIENT GOALS Be able to wash her windows, clean house with less pain. Do some yard work.   OBJECTIVE:   DIAGNOSTIC FINDINGS:  IMPRESSION: 1. Status post L4-L5 fusion. No spinal canal stenosis. Susceptibility artifact from hardware obscures the neural  foramina. 2. Mild left neural foraminal narrowing at L3-L4, and possibly at L5-S1.   On: 10/12/202  PATIENT SURVEYS:  FOTO 11/9: 36%   evaluation:  29%  SCREENING FOR RED FLAGS: Bowel or bladder incontinence: No Spinal tumors: No Cauda equina syndrome: No Compression fracture: No Abdominal aneurysm: No  COGNITION:  Overall cognitive status: Within functional limits for tasks assessed     SENSATION: Light touch: Impaired  Rt foot, reports hx of neuropathy    POSTURE: rounded shoulders, forward head, and increased thoracic kyphosis   LUMBAR ROM:   Active  A/PROM  eval A/PROM 03/14/22  Flexion Mid shin Mid shin (can't go further due to vertigo)  Extension Limited 50% WNL  Right lateral flexion Top of patella Mid shin  Left lateral flexion Past mid jointline Mid shin  Right rotation wfl WFL  Left rotation wfl WFL   (Blank rows = not tested)   LOWER EXTREMITY MMT:    MMT (Low effort) Right eval Right 03/14/22 Left eval Left 03/14/22  Hip flexion 4 4+ in supine 4 3 in supine  Hip extension 2 3+ in prone 2 3+ in prone  Hip abduction 4- 4- in sidelying 4- 3+in sidelying  Hip adduction 4 4 in sidelying 4 3 in sidelying  Hip internal rotation      Hip external rotation      Knee flexion 3 4+ in prone 3 4+ in prone  Knee extension 3+ 5 5 5   Ankle dorsiflexion 4- 4+ 4+ 5  Ankle plantarflexion      Ankle inversion      Ankle eversion       (Blank rows = not tested)  LUMBAR SPECIAL TESTS:  Slump test: Negative  FUNCTIONAL TESTS:  5 times sit to stand: 11/9:   15.2sec      at evaluation: 17.5 sec 2 minute walk test: 306 feet no AD  GAIT: Distance walked: 306 feet no AD Assistive device utilized: None Level of assistance: Complete Independence Comments: Very erratic; no consistent gait pattern, changing speed, veering, frequently, wide and narrow BOS but inconsistent throughout distance. Not ataxic.   TODAY'S TREATMENT  04/03/22 Educated on breathing to calm  CNS for stress Quadruped:  Cat/camel 10x 10" Hip extension 5x 5" Child's pose 3x 30"  Squats front of chair for mechanics 10x  03/27/22 STS X10 no HHA eccentric control Standing: heelraises 20X  Squats no added weight 10X  Alternating high march holds  10X   03/19/22 STS 2x10 no HHA eccentric control Standing:  Heel raise 2X10 Squat front of chair 2# DB 3x10  Alternating high march forward 3X10 Seated LAQ 3# AW 3x10 Standing HS curl 3# 2x10  02/04/57 Progress note/certification MMT/ROM testing (see above) FOTO 11/9: 36%   evaluation:  29% 5 times sit to stand: 11/9: 15.2sec   at evaluation: 17.5 sec  02/27/22 STS 2x10 no HHA eccentric control Standing:  Heel raise 2X10 Squat front of chair 2x10  Forward step ups 4" 10X with 1 UE assist Alternating high march 2X10 Seated piriformis stretch 30" each, hip flexor stretch 30" each  Leg extension hamstring stretch 20" each LE    PATIENT EDUCATION:  Education details: findings, safety awareness, POC, PT role Person educated: Patient Education method: Explanation Education comprehension: verbalized understanding  HOME EXERCISE PROGRAM: Access Code: [email or web address] URL: [email or web address] 04/03/22: Cat/camel   02/20/22: STS Hip abd Hip extension  02/13/22 - Hooklying Gluteal Sets  - 1 x daily - 7 x weekly - 1 sets - 10 reps - 3-5 second hold  Date: 02/12/2022 Prepared by: [facility] - [facility]hab  Exercises - Clamshell  - 1 x daily - 7 x weekly - 1 sets - 10 reps - Supine Lower Trunk Rotation  - 1 x daily - 7 x weekly - 1 sets - 10 reps - Supine Transversus Abdominis Bracing - Hands on Stomach  - 1 x daily - 7 x weekly - 1 sets - 10 reps - 5 sec hold - Supine Bridge  - 1 x daily - 7 x weekly - 1 sets - 10 reps - Supine March  - 1 x daily - 7 x weekly - 1 sets - 10 reps  ASSESSMENT:  CLINICAL IMPRESSION: Pt limited by stress and discussed her symptoms of increased soreness 2 sessions ago for majority of  session, required cueing to stay on task through session.  Pt educated on benefits of deep breathing to relax CNS for stress relief.  Session began with yoga exercises for core/proximal stability incorporating breathing through session.  Pt received call during session and had to leave early.  Pt stated she liked the cat/camel exercise added today and requested addition to HEP.   OBJECTIVE IMPAIRMENTS Abnormal gait, decreased activity tolerance, decreased balance, decreased endurance, decreased knowledge of condition, decreased knowledge of use of DME, decreased mobility, difficulty walking, decreased ROM, decreased strength, decreased safety awareness, hypomobility, impaired flexibility, impaired sensation, improper body mechanics, postural dysfunction, and pain.   ACTIVITY LIMITATIONS carrying, lifting, bending, sitting, standing, squatting, stairs, and locomotion level  PARTICIPATION LIMITATIONS: cleaning, laundry, driving, shopping, community activity, and yard work  PERSONAL FACTORS Age, Behavior pattern, and Time since onset of injury/illness/exacerbation are also affecting patient's functional outcome.   REHAB POTENTIAL: Good  CLINICAL DECISION MAKING: Stable/uncomplicated  EVALUATION COMPLEXITY: Low   GOALS: Goals reviewed with patient? Yes  SHORT TERM GOALS: Target date: 02/19/22  Patient will be independent with initial HEP and self-management strategies to improve functional outcomes Baseline: Initiated Goal status: IN PROGRESS    LONG TERM GOALS: Target date: 03/12/22  Patient will be independent with advanced HEP and self-management strategies to improve functional outcomes Baseline:  Goal status: IN PROGRESS  2.  Patient will improve FOTO score to predicted value of 45% to indicate improvement in functional outcomes Baseline: 29% Goal status: IN PROGRESS  3.  Patient will report reduction of back pain to <3/10 for improved quality of life and ability to perform ADLs  Baseline: 8/10 Goal status: IN PROGRESS  4. Patient will have equal to or > 4+/5 MMT throughout BIL LEs to improve ability to perform functional mobility, stair ambulation and ADLs.  Baseline: See above Goal status: IN PROGRESS   PLAN: PT FREQUENCY: 2x/week  PT DURATION: 6 weeks  PLANNED INTERVENTIONS: Therapeutic exercises, Therapeutic activity, Neuromuscular re-education, Balance training, Gait training, Patient/Family education, Self Care, Joint mobilization, Joint manipulation, Stair training, DME instructions, Dry Needling, Electrical stimulation, Spinal manipulation, Spinal mobilization, Cryotherapy, Moist heat, Taping, Traction, Ultrasound, Ionotophoresis 21m/ml Dexamethasone, Manual therapy, and Re-evaluation.  PLAN FOR NEXT SESSION: Core and LE strengthening, gait and balance training as needed. Progress standing stability exercises.   CIhor Austin LPTA/CLT; CBIS [phone] 3:04 PM, 04/03/22

## 2022-04-05 ENCOUNTER — Ambulatory Visit (HOSPITAL_COMMUNITY): Payer: Medicare Other | Attending: Internal Medicine

## 2022-04-05 DIAGNOSIS — M6281 Muscle weakness (generalized): Secondary | ICD-10-CM | POA: Insufficient documentation

## 2022-04-05 DIAGNOSIS — M545 Low back pain, unspecified: Secondary | ICD-10-CM | POA: Diagnosis not present

## 2022-04-05 DIAGNOSIS — M5416 Radiculopathy, lumbar region: Secondary | ICD-10-CM | POA: Diagnosis not present

## 2022-04-05 DIAGNOSIS — R262 Difficulty in walking, not elsewhere classified: Secondary | ICD-10-CM | POA: Insufficient documentation

## 2022-04-05 NOTE — Therapy (Signed)
OUTPATIENT PHYSICAL THERAPY THORACOLUMBAR TREATMENT      Patient Name: [name] [name] MRN: [id number] DOB:1956/02/25, 66 y.o., female Today's Date: 04/05/2022   PT End of Session - 04/05/22 1347     Visit Number 12    Number of Visits 18    Date for PT Re-Evaluation 04/25/22    Authorization Type [employer] Medicare (Medicaid [place] secondary)    Authorization Time Period (No auth, no LV)    Progress Note Due on Visit 18    PT Start Time 1345    PT Stop Time 1425    PT Time Calculation (min) 40 min    Activity Tolerance Patient tolerated treatment well    Behavior During Therapy [facility] for tasks assessed/performed                 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   Crohn disease ([place])    CTS (carpal tunnel syndrome)    Diarrhea MULITIFACTORIAL   IBS, LACTOSE INTOLERANCE, SBBO, BILE-SALT   Elevated liver enzymes 2009: BMI 24 ?Etoh ALT 94, AST 51,  NEG ANA, qIGs& ASMA   MAY 2012 AST 52 ALT 38   Esophageal stricture 2009   GERD (gastroesophageal reflux disease)    Hemorrhoid    Hyperlipemia    Hypertension    Inflammatory bowel disease 2006 CD   SURGICAL REMISSION   LBP (low back pain)    Mental disorder    Migraine    Osteoporosis    PONV (postoperative nausea and vomiting)    Shortness of breath    exertion and humidity   Shoulder pain    right   Sleep apnea    Stop Bang Cock score of 4   Past Surgical History:  Procedure Laterality Date   BACK SURGERY     BACK SURGERY  07/16/2018   BILATERAL SALPINGOOPHORECTOMY     BIOPSY  12/24/2011   Procedure: BIOPSY;  Surgeon: Danie Binder, MD;  Location: [facility];  Service: Endoscopy;;  Gastric Biopsies   BIOPSY N/A 06/30/2012   Procedure: BIOPSY;  Surgeon: Danie Binder, MD;  Location: [facility];  Service: Endoscopy;  Laterality: N/A;   Gastric and Esophageal Biopsies   CARPAL TUNNEL RELEASE  LEFT   CATARACT EXTRACTION W/PHACO Right 07/30/2016   Procedure: CATARACT EXTRACTION PHACO AND INTRAOCULAR LENS PLACEMENT ([place]);  Surgeon: Rutherford Guys, MD;  Location: [facility];  Service: Ophthalmology;  Laterality: Right;  CDE: 5.45   CATARACT EXTRACTION W/PHACO Left 08/13/2016   Procedure: CATARACT EXTRACTION PHACO AND INTRAOCULAR LENS PLACEMENT ([facility]);  Surgeon: Rutherford Guys, MD;  Location: [facility];  Service: Ophthalmology;  Laterality: Left;  CDE: 5.59   COLON SURGERY     COLONOSCOPY  JAN 2009 DIARRHEA, ABD PAIN, BRBPR   ANASTOMOTIC ULCER(3MM), IH PN:TIRWER ULCER, NL COLON bX   COLONOSCOPY WITH PROPOFOL N/A 04/21/2018   examined portion of the ileum normal, two 3-5 mm polyps, diverticulosis in the rectosigmoid and sigmoid colon, external and internal hemorrhoids, significant looping of the colon.  Pathology with tubular adenomas.  Recommendations to follow high-fiber diet and repeat colonoscopy in 5 to 10 years with peds colonoscope.   DILATION AND CURETTAGE OF UTERUS     ESOPHAGEAL DILATION  12/24/2011  SLF: A stricture was found in the distal esophagus/ Moderate gastritis/ MILD Duodenitis   ESOPHAGOGASTRODUODENOSCOPY  02/07/09   mild gastritis   ESOPHAGOGASTRODUODENOSCOPY (EGD) WITH PROPOFOL N/A 06/30/2012   SLF: 1. No definite stricture appreciated 2. small hiatal hernia 3. Gastritis   ESOPHAGOGASTRODUODENOSCOPY (EGD) WITH PROPOFOL N/A 02/20/2016   Procedure: ESOPHAGOGASTRODUODENOSCOPY (EGD) WITH PROPOFOL;  Surgeon: Danie Binder, MD;  [facility];  Service: Endoscopy;  Laterality: N/A;  930   ESOPHAGOGASTRODUODENOSCOPY (EGD) WITH PROPOFOL N/A 11/24/2018   benign-appearing esophageal peptic stricture s/p dilation, small hiatal hernia, moderate erosive gastritis.    EYE SURGERY     gum flap Bilateral    HEMICOLECTOMY  RIGHT 2006   HERNIA REPAIR     LUMBAR DISC SURGERY     POLYPECTOMY  04/21/2018   Procedure: POLYPECTOMY;   Surgeon: Danie Binder, MD;  [facility];  Service: Endoscopy;;  (colon)   SAVORY DILATION N/A 06/30/2012   Procedure: SAVORY DILATION;  Surgeon: Danie Binder, MD;  Location: [facility];  Service: Endoscopy;  Laterality: N/A;  12.57m , 161m 153m SAVORY DILATION N/A 02/20/2016   Procedure: SAVORY DILATION;  Surgeon: SanDanie BinderD;  [facility];  Service: Endoscopy;  Laterality: N/A;   SAVORY DILATION N/A 11/24/2018   Procedure: SAVORY DILATION;  Surgeon: FieDanie BinderD;  [facility];  Service: Endoscopy;  Laterality: N/A;   UPPER GASTROINTESTINAL ENDOSCOPY  JAN 2009 ABD PAIN   GASTRITIS, ESO RING   UPPER GASTROINTESTINAL ENDOSCOPY  OCT 2010 ABD PAIN, DYSPHAGIA   DIL 15MM, GASTRITIS, NL DUODENUM   UPPER GASTROINTESTINAL ENDOSCOPY  OCT 2010 DYSPHAGIA   DIL 17 MM, GASTRITIS, NL DUODENUM   vocal cord surgery  Sep 20, 2010   precancerous areas removed   wisdom tooth extraction Right    pin placement due to broken jaw   Patient Active Problem List   Diagnosis Date Noted   Encounter for general adult medical examination with abnormal findings 12/27/2021   Rash 08/14/2021   Hypothyroidism 12/13/2020   S/P lumbar fusion 10/19/2019   Seasonal allergies 09/01/2018   Chronic low back pain 04/07/2018   Perianal cyst 02/24/2018   Alcohol abuse 12/23/2017   Tobacco abuse 06/25/2017   Osteoarthritis 12/27/2014   Allergic dermatitis eyelid 12/27/2014   Abnormal CT scan, chest 01/12/2014   Polycythemia 09/08/2012   Vertigo 01/22/2012   Neck pain 01/22/2012   Chronic pain syndrome 12/27/2011   Diarrhea, episodic 12/27/2011   Edema of larynx 04/22/2011   Dysphagia 03/14/2011   DEPRESSION 04/24/2010   Chronic bronchitis ([place]9/16/2011   Osteoporosis 11/11/2008   Spondylolisthesis, lumbar region 07/28/2008   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/2007   DEGENERATIVE DISBiloxiSEASE, CERVICAL SPINE 06/29/2007   Hyperlipidemia 02/03/2007    VARCIES, SITE NEC 09/30/2006   Anxiety state 02/27/2006   Essential hypertension 02/27/2006   GERD 02/27/2006   Crohn's disease of small intestine with complication ([id number] PCP: RutIhor DowD  REFERRING PROVIDER: RutIhor DowD  REFERRING DIAG: M43.16 (ICD-10-CM) - Spondylolisthesis, lumbar region   Rationale for Evaluation and Treatment Rehabilitation  THERAPY DIAG:  Low back pain, unspecified back pain laterality, unspecified chronicity, unspecified whether sciatica present  Difficulty in walking, not elsewhere classified  Muscle weakness (generalized)  Radiculopathy, lumbar region  ONSET DATE: June 2023  SUBJECTIVE:  SUBJECTIVE STATEMENT: Pain a little more today; 5/10  PERTINENT HISTORY:  Chron's disease  PAIN:  Are you having pain? Yes: NPRS scale: 5/10 Pain location: back and Lt LE Pain description: sore Aggravating factors: sweeping, too much activity Relieving factors: medication   PRECAUTIONS: None  WEIGHT BEARING RESTRICTIONS No  FALLS:  Has patient fallen in last 6 months? No  LIVING ENVIRONMENT: Lives with: lives with their family and lives with their spouse Lives in: House/apartment Stairs: Yes: External: 4 steps; on right going up Has following equipment at home: Single point cane walking stick  OCCUPATION: Does not work  PLOF: Independent  PATIENT GOALS Be able to wash her windows, clean house with less pain. Do some yard work.   OBJECTIVE:   DIAGNOSTIC FINDINGS:  IMPRESSION: 1. Status post L4-L5 fusion. No spinal canal stenosis. Susceptibility artifact from hardware obscures the neural foramina. 2. Mild left neural foraminal narrowing at L3-L4, and possibly at L5-S1.   On: 10/12/202  PATIENT SURVEYS:  FOTO 11/9: 36%   evaluation:   29%  SCREENING FOR RED FLAGS: Bowel or bladder incontinence: No Spinal tumors: No Cauda equina syndrome: No Compression fracture: No Abdominal aneurysm: No  COGNITION:  Overall cognitive status: Within functional limits for tasks assessed     SENSATION: Light touch: Impaired  Rt foot, reports hx of neuropathy    POSTURE: rounded shoulders, forward head, and increased thoracic kyphosis   LUMBAR ROM:   Active  A/PROM  eval A/PROM 03/14/22  Flexion Mid shin Mid shin (can't go further due to vertigo)  Extension Limited 50% WNL  Right lateral flexion Top of patella Mid shin  Left lateral flexion Past mid jointline Mid shin  Right rotation wfl WFL  Left rotation wfl WFL   (Blank rows = not tested)   LOWER EXTREMITY MMT:    MMT (Low effort) Right eval Right 03/14/22 Left eval Left 03/14/22  Hip flexion 4 4+ in supine 4 3 in supine  Hip extension 2 3+ in prone 2 3+ in prone  Hip abduction 4- 4- in sidelying 4- 3+in sidelying  Hip adduction 4 4 in sidelying 4 3 in sidelying  Hip internal rotation      Hip external rotation      Knee flexion 3 4+ in prone 3 4+ in prone  Knee extension 3+ 5 5 5   Ankle dorsiflexion 4- 4+ 4+ 5  Ankle plantarflexion      Ankle inversion      Ankle eversion       (Blank rows = not tested)  LUMBAR SPECIAL TESTS:  Slump test: Negative  FUNCTIONAL TESTS:  5 times sit to stand: 11/9:   15.2sec      at evaluation: 17.5 sec 2 minute walk test: 306 feet no AD  GAIT: Distance walked: 306 feet no AD Assistive device utilized: None Level of assistance: Complete Independence Comments: Very erratic; no consistent gait pattern, changing speed, veering, frequently, wide and narrow BOS but inconsistent throughout distance. Not ataxic.   TODAY'S TREATMENT  04/05/22 Supine: LTR x 20 Transverse abdominus contraction 5" x 10 Transverse abdominus contraction with march x 10 each Bridge 3" hold x 10  Quadruped: Cat/camel x 10; 5 sec  hold Childs pose x 5; 30 sec hold  Sit  to stand from lowered mat table x 10 no UE assist     04/03/22 Educated on breathing to calm CNS for stress Quadruped:  Cat/camel 10x 10" Hip extension 5x 5" Child's pose 3x 30"  Squats front of chair for mechanics 10x  03/27/22 STS X10 no HHA eccentric control Standing: heelraises 20X  Squats no added weight 10X  Alternating high march holds 10X   03/19/22 STS 2x10 no HHA eccentric control Standing:  Heel raise 2X10 Squat front of chair 2# DB 3x10  Alternating high march forward 3X10 Seated LAQ 3# AW 3x10 Standing HS curl 3# 2x10  38/1/82 Progress note/certification MMT/ROM testing (see above) FOTO 11/9: 36%   evaluation:  29% 5 times sit to stand: 11/9: 15.2sec   at evaluation: 17.5 sec  02/27/22 STS 2x10 no HHA eccentric control Standing:  Heel raise 2X10 Squat front of chair 2x10  Forward step ups 4" 10X with 1 UE assist Alternating high march 2X10 Seated piriformis stretch 30" each, hip flexor stretch 30" each  Leg extension hamstring stretch 20" each LE    PATIENT EDUCATION:  Education details: findings, safety awareness, POC, PT role Person educated: Patient Education method: Explanation Education comprehension: verbalized understanding  HOME EXERCISE PROGRAM: Access Code: [email or web address] URL: [email or web address] 04/03/22: Cat/camel   02/20/22: STS Hip abd Hip extension  02/13/22 - Hooklying Gluteal Sets  - 1 x daily - 7 x weekly - 1 sets - 10 reps - 3-5 second hold  Date: 02/12/2022 Prepared by: [facility] - [facility]  Exercises - Clamshell  - 1 x daily - 7 x weekly - 1 sets - 10 reps - Supine Lower Trunk Rotation  - 1 x daily - 7 x weekly - 1 sets - 10 reps - Supine Transversus Abdominis Bracing - Hands on Stomach  - 1 x daily - 7 x weekly - 1 sets - 10 reps - 5 sec hold - Supine Bridge  - 1 x daily - 7 x weekly - 1 sets - 10 reps - Supine March  - 1 x daily - 7 x weekly - 1 sets - 10  reps  ASSESSMENT:  CLINICAL IMPRESSION: Today's session continued to focus on core strengthening, lumbar mobility.  Patient has some right knee discomfort with quadruped positioning but otherwise has not issue with exercise.  Needs reminders for correct breathing throughout exercise.  Patient will benefit from continued skilled therapy servicesto address deficits and promote return to optimal function.       OBJECTIVE IMPAIRMENTS Abnormal gait, decreased activity tolerance, decreased balance, decreased endurance, decreased knowledge of condition, decreased knowledge of use of DME, decreased mobility, difficulty walking, decreased ROM, decreased strength, decreased safety awareness, hypomobility, impaired flexibility, impaired sensation, improper body mechanics, postural dysfunction, and pain.   ACTIVITY LIMITATIONS carrying, lifting, bending, sitting, standing, squatting, stairs, and locomotion level  PARTICIPATION LIMITATIONS: cleaning, laundry, driving, shopping, community activity, and yard work  PERSONAL FACTORS Age, Behavior pattern, and Time since onset of injury/illness/exacerbation are also affecting patient's functional outcome.   REHAB POTENTIAL: Good  CLINICAL DECISION MAKING: Stable/uncomplicated  EVALUATION COMPLEXITY: Low   GOALS: Goals reviewed with patient? Yes  SHORT TERM GOALS: Target date: 02/19/22  Patient will be independent with initial HEP and self-management strategies to improve functional outcomes Baseline: Initiated Goal status: IN PROGRESS    LONG TERM GOALS: Target date: 03/12/22  Patient will be independent with advanced HEP and self-management strategies to improve functional outcomes Baseline:  Goal status: IN PROGRESS  2.  Patient will improve FOTO score to predicted value of 45% to indicate improvement in functional outcomes Baseline: 29% Goal status: IN PROGRESS  3.  Patient will report reduction of back pain to <3/10 for improved quality  of life  and ability to perform ADLs  Baseline: 8/10 Goal status: IN PROGRESS  4. Patient will have equal to or > 4+/5 MMT throughout BIL LEs to improve ability to perform functional mobility, stair ambulation and ADLs.  Baseline: See above Goal status: IN PROGRESS   PLAN: PT FREQUENCY: 2x/week  PT DURATION: 6 weeks  PLANNED INTERVENTIONS: Therapeutic exercises, Therapeutic activity, Neuromuscular re-education, Balance training, Gait training, Patient/Family education, Self Care, Joint mobilization, Joint manipulation, Stair training, DME instructions, Dry Needling, Electrical stimulation, Spinal manipulation, Spinal mobilization, Cryotherapy, Moist heat, Taping, Traction, Ultrasound, Ionotophoresis 20m/ml Dexamethasone, Manual therapy, and Re-evaluation.  PLAN FOR NEXT SESSION: Core and LE strengthening, gait and balance training as needed. Progress standing stability exercises.   2:29 PM, 04/05/22 Myia Bergh Small Mathilde Mcwherter MPT [place] [facility] #[phone]

## 2022-04-09 ENCOUNTER — Encounter (HOSPITAL_COMMUNITY): Payer: Medicare Other

## 2022-04-12 ENCOUNTER — Ambulatory Visit (HOSPITAL_COMMUNITY): Payer: Medicare Other

## 2022-04-12 ENCOUNTER — Encounter (HOSPITAL_COMMUNITY): Payer: Self-pay

## 2022-04-12 DIAGNOSIS — M6281 Muscle weakness (generalized): Secondary | ICD-10-CM | POA: Diagnosis not present

## 2022-04-12 DIAGNOSIS — M545 Low back pain, unspecified: Secondary | ICD-10-CM | POA: Diagnosis not present

## 2022-04-12 DIAGNOSIS — R262 Difficulty in walking, not elsewhere classified: Secondary | ICD-10-CM

## 2022-04-12 DIAGNOSIS — M5416 Radiculopathy, lumbar region: Secondary | ICD-10-CM

## 2022-04-12 NOTE — Therapy (Signed)
OUTPATIENT PHYSICAL THERAPY THORACOLUMBAR TREATMENT      Patient Name: [name] [name] MRN: [id number] DOB:Dec 22, 1955, 66 y.o., female Today's Date: 04/12/2022   PT End of Session - 04/12/22 1122     Visit Number 13    Number of Visits 18    Date for PT Re-Evaluation 04/25/22    Authorization Type [employer] Medicare (Medicaid [place] secondary)    Authorization Time Period (No auth, no LV)    Progress Note Due on Visit 18    PT Start Time 1118    PT Stop Time 1200    PT Time Calculation (min) 42 min    Activity Tolerance Patient tolerated treatment well    Behavior During Therapy [facility] for tasks assessed/performed                  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   Crohn disease ([place])    CTS (carpal tunnel syndrome)    Diarrhea MULITIFACTORIAL   IBS, LACTOSE INTOLERANCE, SBBO, BILE-SALT   Elevated liver enzymes 2009: BMI 24 ?Etoh ALT 94, AST 51,  NEG ANA, qIGs& ASMA   MAY 2012 AST 52 ALT 38   Esophageal stricture 2009   GERD (gastroesophageal reflux disease)    Hemorrhoid    Hyperlipemia    Hypertension    Inflammatory bowel disease 2006 CD   SURGICAL REMISSION   LBP (low back pain)    Mental disorder    Migraine    Osteoporosis    PONV (postoperative nausea and vomiting)    Shortness of breath    exertion and humidity   Shoulder pain    right   Sleep apnea    Stop Bang Cock score of 4   Past Surgical History:  Procedure Laterality Date   BACK SURGERY     BACK SURGERY  07/16/2018   BILATERAL SALPINGOOPHORECTOMY     BIOPSY  12/24/2011   Procedure: BIOPSY;  Surgeon: Danie Binder, MD;  Location: [facility];  Service: Endoscopy;;  Gastric Biopsies   BIOPSY N/A 06/30/2012   Procedure: BIOPSY;  Surgeon: Danie Binder, MD;  Location: [facility];  Service: Endoscopy;  Laterality: N/A;   Gastric and Esophageal Biopsies   CARPAL TUNNEL RELEASE  LEFT   CATARACT EXTRACTION W/PHACO Right 07/30/2016   Procedure: CATARACT EXTRACTION PHACO AND INTRAOCULAR LENS PLACEMENT ([place]);  Surgeon: Rutherford Guys, MD;  Location: [facility];  Service: Ophthalmology;  Laterality: Right;  CDE: 5.45   CATARACT EXTRACTION W/PHACO Left 08/13/2016   Procedure: CATARACT EXTRACTION PHACO AND INTRAOCULAR LENS PLACEMENT (IOC);  Surgeon: Rutherford Guys, MD;  Location: [facility];  Service: Ophthalmology;  Laterality: Left;  CDE: 5.59   COLON SURGERY     COLONOSCOPY  JAN 2009 DIARRHEA, ABD PAIN, BRBPR   ANASTOMOTIC ULCER(3MM), IH GL:OVFIEP ULCER, NL COLON bX   COLONOSCOPY WITH PROPOFOL N/A 04/21/2018   examined portion of the ileum normal, two 3-5 mm polyps, diverticulosis in the rectosigmoid and sigmoid colon, external and internal hemorrhoids, significant looping of the colon.  Pathology with tubular adenomas.  Recommendations to follow high-fiber diet and repeat colonoscopy in 5 to 10 years with peds colonoscope.   DILATION AND CURETTAGE OF UTERUS     ESOPHAGEAL DILATION  12/24/2011  SLF: A stricture was found in the distal esophagus/ Moderate gastritis/ MILD Duodenitis   ESOPHAGOGASTRODUODENOSCOPY  02/07/09   mild gastritis   ESOPHAGOGASTRODUODENOSCOPY (EGD) WITH PROPOFOL N/A 06/30/2012   SLF: 1. No definite stricture appreciated 2. small hiatal hernia 3. Gastritis   ESOPHAGOGASTRODUODENOSCOPY (EGD) WITH PROPOFOL N/A 02/20/2016   Procedure: ESOPHAGOGASTRODUODENOSCOPY (EGD) WITH PROPOFOL;  Surgeon: Danie Binder, MD;  [facility];  Service: Endoscopy;  Laterality: N/A;  930   ESOPHAGOGASTRODUODENOSCOPY (EGD) WITH PROPOFOL N/A 11/24/2018   benign-appearing esophageal peptic stricture s/p dilation, small hiatal hernia, moderate erosive gastritis.    EYE SURGERY     gum flap Bilateral    HEMICOLECTOMY  RIGHT 2006   HERNIA REPAIR     LUMBAR DISC SURGERY     POLYPECTOMY  04/21/2018   Procedure: POLYPECTOMY;   Surgeon: Danie Binder, MD;  [facility];  Service: Endoscopy;;  (colon)   SAVORY DILATION N/A 06/30/2012   Procedure: SAVORY DILATION;  Surgeon: Danie Binder, MD;  Location: [facility];  Service: Endoscopy;  Laterality: N/A;  12.93m , 147m 1537m SAVORY DILATION N/A 02/20/2016   Procedure: SAVORY DILATION;  Surgeon: SanDanie BinderD;  [facility];  Service: Endoscopy;  Laterality: N/A;   SAVORY DILATION N/A 11/24/2018   Procedure: SAVORY DILATION;  Surgeon: FieDanie BinderD;  [facility];  Service: Endoscopy;  Laterality: N/A;   UPPER GASTROINTESTINAL ENDOSCOPY  JAN 2009 ABD PAIN   GASTRITIS, ESO RING   UPPER GASTROINTESTINAL ENDOSCOPY  OCT 2010 ABD PAIN, DYSPHAGIA   DIL 15MM, GASTRITIS, NL DUODENUM   UPPER GASTROINTESTINAL ENDOSCOPY  OCT 2010 DYSPHAGIA   DIL 17 MM, GASTRITIS, NL DUODENUM   vocal cord surgery  Sep 20, 2010   precancerous areas removed   wisdom tooth extraction Right    pin placement due to broken jaw   Patient Active Problem List   Diagnosis Date Noted   Encounter for general adult medical examination with abnormal findings 12/27/2021   Rash 08/14/2021   Hypothyroidism 12/13/2020   S/P lumbar fusion 10/19/2019   Seasonal allergies 09/01/2018   Chronic low back pain 04/07/2018   Perianal cyst 02/24/2018   Alcohol abuse 12/23/2017   Tobacco abuse 06/25/2017   Osteoarthritis 12/27/2014   Allergic dermatitis eyelid 12/27/2014   Abnormal CT scan, chest 01/12/2014   Polycythemia 09/08/2012   Vertigo 01/22/2012   Neck pain 01/22/2012   Chronic pain syndrome 12/27/2011   Diarrhea, episodic 12/27/2011   Edema of larynx 04/22/2011   Dysphagia 03/14/2011   DEPRESSION 04/24/2010   Chronic bronchitis (HCCPercy9/16/2011   Osteoporosis 11/11/2008   Spondylolisthesis, lumbar region 07/28/2008   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/2007   DEGENERATIVE DISLeona ValleySEASE, CERVICAL SPINE 06/29/2007   Hyperlipidemia 02/03/2007    VARCIES, SITE NEC 09/30/2006   Anxiety state 02/27/2006   Essential hypertension 02/27/2006   GERD 02/27/2006   Crohn's disease of small intestine with complication ([id number] PCP: RutIhor DowD  REFERRING PROVIDER: RutIhor DowD  REFERRING DIAG: M43.16 (ICD-10-CM) - Spondylolisthesis, lumbar region   Rationale for Evaluation and Treatment Rehabilitation  THERAPY DIAG:  Low back pain, unspecified back pain laterality, unspecified chronicity, unspecified whether sciatica present  Difficulty in walking, not elsewhere classified  Muscle weakness (generalized)  Radiculopathy, lumbar region  ONSET DATE: June 2023  SUBJECTIVE:  SUBJECTIVE STATEMENT: Pt stated she is feeling better today, does like the yoga stretches stated they feel good to her back.  No reports of radicular symptoms today.  Does c/o occasional dizziness.  PERTINENT HISTORY:  Chron's disease  PAIN:  Are you having pain? Yes: NPRS scale: 3/10 Pain location: lower back Pain description: sore Aggravating factors: sweeping, too much activity Relieving factors: medication   PRECAUTIONS: None  WEIGHT BEARING RESTRICTIONS No  FALLS:  Has patient fallen in last 6 months? No  LIVING ENVIRONMENT: Lives with: lives with their family and lives with their spouse Lives in: House/apartment Stairs: Yes: External: 4 steps; on right going up Has following equipment at home: Single point cane walking stick  OCCUPATION: Does not work  PLOF: Independent  PATIENT GOALS Be able to wash her windows, clean house with less pain. Do some yard work.   OBJECTIVE:   DIAGNOSTIC FINDINGS:  IMPRESSION: 1. Status post L4-L5 fusion. No spinal canal stenosis. Susceptibility artifact from hardware obscures the neural  foramina. 2. Mild left neural foraminal narrowing at L3-L4, and possibly at L5-S1.   On: 10/12/202  PATIENT SURVEYS:  FOTO 11/9: 36%   evaluation:  29%  SCREENING FOR RED FLAGS: Bowel or bladder incontinence: No Spinal tumors: No Cauda equina syndrome: No Compression fracture: No Abdominal aneurysm: No  COGNITION:  Overall cognitive status: Within functional limits for tasks assessed     SENSATION: Light touch: Impaired  Rt foot, reports hx of neuropathy    POSTURE: rounded shoulders, forward head, and increased thoracic kyphosis   LUMBAR ROM:   Active  A/PROM  eval A/PROM 03/14/22  Flexion Mid shin Mid shin (can't go further due to vertigo)  Extension Limited 50% WNL  Right lateral flexion Top of patella Mid shin  Left lateral flexion Past mid jointline Mid shin  Right rotation wfl WFL  Left rotation wfl WFL   (Blank rows = not tested)   LOWER EXTREMITY MMT:    MMT (Low effort) Right eval Right 03/14/22 Left eval Left 03/14/22  Hip flexion 4 4+ in supine 4 3 in supine  Hip extension 2 3+ in prone 2 3+ in prone  Hip abduction 4- 4- in sidelying 4- 3+in sidelying  Hip adduction 4 4 in sidelying 4 3 in sidelying  Hip internal rotation      Hip external rotation      Knee flexion 3 4+ in prone 3 4+ in prone  Knee extension 3+ 5 5 5   Ankle dorsiflexion 4- 4+ 4+ 5  Ankle plantarflexion      Ankle inversion      Ankle eversion       (Blank rows = not tested)  LUMBAR SPECIAL TESTS:  Slump test: Negative  FUNCTIONAL TESTS:  5 times sit to stand: 11/9:   15.2sec      at evaluation: 17.5 sec 2 minute walk test: 306 feet no AD  GAIT: Distance walked: 306 feet no AD Assistive device utilized: None Level of assistance: Complete Independence Comments: Very erratic; no consistent gait pattern, changing speed, veering, frequently, wide and narrow BOS but inconsistent throughout distance. Not ataxic.   TODAY'S TREATMENT  04/12/22 Warrior II 2x 30" Warrior I  2x 30" Squat then heel raise with cueing for form/mechanics 10x 3" Vector stance 3x 5" 1 HHA  04/05/22 Supine: LTR x 20 Transverse abdominus contraction 5" x 10 Transverse abdominus contraction with march x 10 each Bridge 3" hold x 10  Quadruped: Cat/camel x 10; 5 sec  hold Ardine Eng pose x 5; 30 sec hold  Sit  to stand from lowered mat table x 10 no UE assist     04/03/22 Educated on breathing to calm CNS for stress Quadruped:  Cat/camel 10x 10" Hip extension 5x 5" Child's pose 3x 30"  Squats front of chair for mechanics 10x  03/27/22 STS X10 no HHA eccentric control Standing: heelraises 20X  Squats no added weight 10X  Alternating high march holds 10X   03/19/22 STS 2x10 no HHA eccentric control Standing:  Heel raise 2X10 Squat front of chair 2# DB 3x10  Alternating high march forward 3X10 Seated LAQ 3# AW 3x10 Standing HS curl 3# 2x10  81/2/75 Progress note/certification MMT/ROM testing (see above) FOTO 11/9: 36%   evaluation:  29% 5 times sit to stand: 11/9: 15.2sec   at evaluation: 17.5 sec  02/27/22 STS 2x10 no HHA eccentric control Standing:  Heel raise 2X10 Squat front of chair 2x10  Forward step ups 4" 10X with 1 UE assist Alternating high march 2X10 Seated piriformis stretch 30" each, hip flexor stretch 30" each  Leg extension hamstring stretch 20" each LE    PATIENT EDUCATION:  Education details: findings, safety awareness, POC, PT role Person educated: Patient Education method: Explanation Education comprehension: verbalized understanding  HOME EXERCISE PROGRAM: Access Code: [email or web address] URL: [email or web address] 04/03/22: Cat/camel   02/20/22: STS Hip abd Hip extension  02/13/22 - Hooklying Gluteal Sets  - 1 x daily - 7 x weekly - 1 sets - 10 reps - 3-5 second hold  Date: 02/12/2022 Prepared by: [facility] - Rehab  Exercises - Clamshell  - 1 x daily - 7 x weekly - 1 sets - 10 reps - Supine Lower Trunk Rotation  - 1 x  daily - 7 x weekly - 1 sets - 10 reps - Supine Transversus Abdominis Bracing - Hands on Stomach  - 1 x daily - 7 x weekly - 1 sets - 10 reps - 5 sec hold - Supine Bridge  - 1 x daily - 7 x weekly - 1 sets - 10 reps - Supine March  - 1 x daily - 7 x weekly - 1 sets - 10 reps  ASSESSMENT:  CLINICAL IMPRESSION: Pt stated she likes the yoga exercises we have been completing last couple of sessions.  Added warrior poses for core stability and balance training with SBA and cueing for form.  Pt tolerated well with new exercises, SBA or 1 intermittent HHA required for safety.  Encouraged pt not to begin these exercises at home until she can present with proper form.  EOS no reports of pain, was limited by some fatigue.     OBJECTIVE IMPAIRMENTS Abnormal gait, decreased activity tolerance, decreased balance, decreased endurance, decreased knowledge of condition, decreased knowledge of use of DME, decreased mobility, difficulty walking, decreased ROM, decreased strength, decreased safety awareness, hypomobility, impaired flexibility, impaired sensation, improper body mechanics, postural dysfunction, and pain.   ACTIVITY LIMITATIONS carrying, lifting, bending, sitting, standing, squatting, stairs, and locomotion level  PARTICIPATION LIMITATIONS: cleaning, laundry, driving, shopping, community activity, and yard work  PERSONAL FACTORS Age, Behavior pattern, and Time since onset of injury/illness/exacerbation are also affecting patient's functional outcome.   REHAB POTENTIAL: Good  CLINICAL DECISION MAKING: Stable/uncomplicated  EVALUATION COMPLEXITY: Low   GOALS: Goals reviewed with patient? Yes  SHORT TERM GOALS: Target date: 02/19/22  Patient will be independent with initial HEP and self-management strategies to improve functional outcomes Baseline: Initiated Goal status: IN PROGRESS    LONG  TERM GOALS: Target date: 03/12/22  Patient will be independent with advanced HEP and self-management  strategies to improve functional outcomes Baseline:  Goal status: IN PROGRESS  2.  Patient will improve FOTO score to predicted value of 45% to indicate improvement in functional outcomes Baseline: 29% Goal status: IN PROGRESS  3.  Patient will report reduction of back pain to <3/10 for improved quality of life and ability to perform ADLs  Baseline: 8/10 Goal status: IN PROGRESS  4. Patient will have equal to or > 4+/5 MMT throughout BIL LEs to improve ability to perform functional mobility, stair ambulation and ADLs.  Baseline: See above Goal status: IN PROGRESS   PLAN: PT FREQUENCY: 2x/week  PT DURATION: 6 weeks  PLANNED INTERVENTIONS: Therapeutic exercises, Therapeutic activity, Neuromuscular re-education, Balance training, Gait training, Patient/Family education, Self Care, Joint mobilization, Joint manipulation, Stair training, DME instructions, Dry Needling, Electrical stimulation, Spinal manipulation, Spinal mobilization, Cryotherapy, Moist heat, Taping, Traction, Ultrasound, Ionotophoresis 31m/ml Dexamethasone, Manual therapy, and Re-evaluation.  PLAN FOR NEXT SESSION: Core and LE strengthening, gait and balance training as needed. Progress standing stability exercises.   CIhor Austin LPTA/CLT; CBIS [phone] 12:04 PM, 04/12/22

## 2022-04-16 ENCOUNTER — Encounter (HOSPITAL_COMMUNITY): Payer: Medicare Other | Admitting: Physical Therapy

## 2022-04-16 ENCOUNTER — Ambulatory Visit (HOSPITAL_COMMUNITY): Payer: Medicare Other | Admitting: Physical Therapy

## 2022-04-16 DIAGNOSIS — M6281 Muscle weakness (generalized): Secondary | ICD-10-CM | POA: Diagnosis not present

## 2022-04-16 DIAGNOSIS — M545 Low back pain, unspecified: Secondary | ICD-10-CM

## 2022-04-16 DIAGNOSIS — R262 Difficulty in walking, not elsewhere classified: Secondary | ICD-10-CM | POA: Diagnosis not present

## 2022-04-16 DIAGNOSIS — M5416 Radiculopathy, lumbar region: Secondary | ICD-10-CM

## 2022-04-16 NOTE — Therapy (Signed)
OUTPATIENT PHYSICAL THERAPY THORACOLUMBAR TREATMENT      Patient Name: [name] [name] MRN: [id number] DOB:20-Nov-1955, 66 y.o., female Today's Date: 04/16/2022   PT End of Session - 04/16/22 1347     Visit Number 14    Number of Visits 18    Date for PT Re-Evaluation 04/25/22    Authorization Type [employer] Medicare (Medicaid [place] secondary)    Authorization Time Period (No auth, no LV)    Progress Note Due on Visit 18    PT Start Time 1310    PT Stop Time 1348    PT Time Calculation (min) 38 min    Activity Tolerance Patient tolerated treatment well    Behavior During Therapy [facility] for tasks assessed/performed                   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   Crohn disease ([place])    CTS (carpal tunnel syndrome)    Diarrhea MULITIFACTORIAL   IBS, LACTOSE INTOLERANCE, SBBO, BILE-SALT   Elevated liver enzymes 2009: BMI 24 ?Etoh ALT 94, AST 51,  NEG ANA, qIGs& ASMA   MAY 2012 AST 52 ALT 38   Esophageal stricture 2009   GERD (gastroesophageal reflux disease)    Hemorrhoid    Hyperlipemia    Hypertension    Inflammatory bowel disease 2006 CD   SURGICAL REMISSION   LBP (low back pain)    Mental disorder    Migraine    Osteoporosis    PONV (postoperative nausea and vomiting)    Shortness of breath    exertion and humidity   Shoulder pain    right   Sleep apnea    Stop Bang Cock score of 4   Past Surgical History:  Procedure Laterality Date   BACK SURGERY     BACK SURGERY  07/16/2018   BILATERAL SALPINGOOPHORECTOMY     BIOPSY  12/24/2011   Procedure: BIOPSY;  Surgeon: Danie Binder, MD;  Location: [facility];  Service: Endoscopy;;  Gastric Biopsies   BIOPSY N/A 06/30/2012   Procedure: BIOPSY;  Surgeon: Danie Binder, MD;  Location: [facility];  Service: Endoscopy;  Laterality: N/A;   Gastric and Esophageal Biopsies   CARPAL TUNNEL RELEASE  LEFT   CATARACT EXTRACTION W/PHACO Right 07/30/2016   Procedure: CATARACT EXTRACTION PHACO AND INTRAOCULAR LENS PLACEMENT ([place]);  Surgeon: Rutherford Guys, MD;  Location: [facility];  Service: Ophthalmology;  Laterality: Right;  CDE: 5.45   CATARACT EXTRACTION W/PHACO Left 08/13/2016   Procedure: CATARACT EXTRACTION PHACO AND INTRAOCULAR LENS PLACEMENT (I[facility]);  Surgeon: Rutherford Guys, MD;  Location: [facility];  Service: Ophthalmology;  Laterality: Left;  CDE: 5.59   COLON SURGERY     COLONOSCOPY  JAN 2009 DIARRHEA, ABD PAIN, BRBPR   ANASTOMOTIC ULCER(3MM), IH WU:[id number] ULCER, NL COLON bX   COLONOSCOPY WITH PROPOFOL N/A 04/21/2018   examined portion of the ileum normal, two 3-5 mm polyps, diverticulosis in the rectosigmoid and sigmoid colon, external and internal hemorrhoids, significant looping of the colon.  Pathology with tubular adenomas.  Recommendations to follow high-fiber diet and repeat colonoscopy in 5 to 10 years with peds colonoscope.   DILATION AND CURETTAGE OF UTERUS     ESOPHAGEAL DILATION  12/24/2011  SLF: A stricture was found in the distal esophagus/ Moderate gastritis/ MILD Duodenitis   ESOPHAGOGASTRODUODENOSCOPY  02/07/09   mild gastritis   ESOPHAGOGASTRODUODENOSCOPY (EGD) WITH PROPOFOL N/A 06/30/2012   SLF: 1. No definite stricture appreciated 2. small hiatal hernia 3. Gastritis   ESOPHAGOGASTRODUODENOSCOPY (EGD) WITH PROPOFOL N/A 02/20/2016   Procedure: ESOPHAGOGASTRODUODENOSCOPY (EGD) WITH PROPOFOL;  Surgeon: Danie Binder, MD;  [facility];  Service: Endoscopy;  Laterality: N/A;  930   ESOPHAGOGASTRODUODENOSCOPY (EGD) WITH PROPOFOL N/A 11/24/2018   benign-appearing esophageal peptic stricture s/p dilation, small hiatal hernia, moderate erosive gastritis.    EYE SURGERY     gum flap Bilateral    HEMICOLECTOMY  RIGHT 2006   HERNIA REPAIR     LUMBAR DISC SURGERY     POLYPECTOMY  04/21/2018   Procedure: POLYPECTOMY;   Surgeon: Danie Binder, MD;  [facility];  Service: Endoscopy;;  (colon)   SAVORY DILATION N/A 06/30/2012   Procedure: SAVORY DILATION;  Surgeon: Danie Binder, MD;  Location: [facility];  Service: Endoscopy;  Laterality: N/A;  12.35m , 160m 1580m SAVORY DILATION N/A 02/20/2016   Procedure: SAVORY DILATION;  Surgeon: SanDanie BinderD;  [facility];  Service: Endoscopy;  Laterality: N/A;   SAVORY DILATION N/A 11/24/2018   Procedure: SAVORY DILATION;  Surgeon: FieDanie BinderD;  [facility];  Service: Endoscopy;  Laterality: N/A;   UPPER GASTROINTESTINAL ENDOSCOPY  JAN 2009 ABD PAIN   GASTRITIS, ESO RING   UPPER GASTROINTESTINAL ENDOSCOPY  OCT 2010 ABD PAIN, DYSPHAGIA   DIL 15MM, GASTRITIS, NL DUODENUM   UPPER GASTROINTESTINAL ENDOSCOPY  OCT 2010 DYSPHAGIA   DIL 17 MM, GASTRITIS, NL DUODENUM   vocal cord surgery  Sep 20, 2010   precancerous areas removed   wisdom tooth extraction Right    pin placement due to broken jaw   Patient Active Problem List   Diagnosis Date Noted   Encounter for general adult medical examination with abnormal findings 12/27/2021   Rash 08/14/2021   Hypothyroidism 12/13/2020   S/P lumbar fusion 10/19/2019   Seasonal allergies 09/01/2018   Chronic low back pain 04/07/2018   Perianal cyst 02/24/2018   Alcohol abuse 12/23/2017   Tobacco abuse 06/25/2017   Osteoarthritis 12/27/2014   Allergic dermatitis eyelid 12/27/2014   Abnormal CT scan, chest 01/12/2014   Polycythemia 09/08/2012   Vertigo 01/22/2012   Neck pain 01/22/2012   Chronic pain syndrome 12/27/2011   Diarrhea, episodic 12/27/2011   Edema of larynx 04/22/2011   Dysphagia 03/14/2011   DEPRESSION 04/24/2010   Chronic bronchitis ([place]CR[place]9/16/2011   Osteoporosis 11/11/2008   Spondylolisthesis, lumbar region 07/28/2008   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/2007   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, CERVICAL SPINE 06/29/2007   Hyperlipidemia 02/03/2007    VARCIES, SITE NEC 09/30/2006   Anxiety state 02/27/2006   Essential hypertension 02/27/2006   GERD 02/27/2006   Crohn's disease of small intestine with complication ([id number]/51/[id number]/5850 PCP: RutIhor DowD  REFERRING PROVIDER: RutIhor DowD  REFERRING DIAG: M43.16 (ICD-10-CM) - Spondylolisthesis, lumbar region   Rationale for Evaluation and Treatment Rehabilitation  THERAPY DIAG:  Low back pain, unspecified back pain laterality, unspecified chronicity, unspecified whether sciatica present  Difficulty in walking, not elsewhere classified  Muscle weakness (generalized)  Radiculopathy, lumbar region  ONSET DATE: June 2023  SUBJECTIVE:  SUBJECTIVE STATEMENT: Pt states still has pain in her Lt buttock/tailbone.Reports her dizziness has not improved.   PERTINENT HISTORY:  Chron's disease  PAIN:  Are you having pain? Yes: NPRS scale: 3/10 Pain location: lower back Pain description: sore Aggravating factors: sweeping, too much activity Relieving factors: medication   PRECAUTIONS: None  WEIGHT BEARING RESTRICTIONS No  FALLS:  Has patient fallen in last 6 months? No  LIVING ENVIRONMENT: Lives with: lives with their family and lives with their spouse Lives in: House/apartment Stairs: Yes: External: 4 steps; on right going up Has following equipment at home: Single point cane walking stick  OCCUPATION: Does not work  PLOF: Independent  PATIENT GOALS Be able to wash her windows, clean house with less pain. Do some yard work.   OBJECTIVE:   DIAGNOSTIC FINDINGS:  IMPRESSION: 1. Status post L4-L5 fusion. No spinal canal stenosis. Susceptibility artifact from hardware obscures the neural foramina. 2. Mild left neural foraminal narrowing at L3-L4, and possibly at L5-S1.   On:  10/12/202  PATIENT SURVEYS:  FOTO 11/9: 36%   evaluation:  29%  SCREENING FOR RED FLAGS: Bowel or bladder incontinence: No Spinal tumors: No Cauda equina syndrome: No Compression fracture: No Abdominal aneurysm: No  COGNITION:  Overall cognitive status: Within functional limits for tasks assessed     SENSATION: Light touch: Impaired  Rt foot, reports hx of neuropathy    POSTURE: rounded shoulders, forward head, and increased thoracic kyphosis   LUMBAR ROM:   Active  A/PROM  eval A/PROM 03/14/22  Flexion Mid shin Mid shin (can't go further due to vertigo)  Extension Limited 50% WNL  Right lateral flexion Top of patella Mid shin  Left lateral flexion Past mid jointline Mid shin  Right rotation wfl WFL  Left rotation wfl WFL   (Blank rows = not tested)   LOWER EXTREMITY MMT:    MMT (Low effort) Right eval Right 03/14/22 Left eval Left 03/14/22  Hip flexion 4 4+ in supine 4 3 in supine  Hip extension 2 3+ in prone 2 3+ in prone  Hip abduction 4- 4- in sidelying 4- 3+in sidelying  Hip adduction 4 4 in sidelying 4 3 in sidelying  Hip internal rotation      Hip external rotation      Knee flexion 3 4+ in prone 3 4+ in prone  Knee extension 3+ 5 5 5   Ankle dorsiflexion 4- 4+ 4+ 5  Ankle plantarflexion      Ankle inversion      Ankle eversion       (Blank rows = not tested)  LUMBAR SPECIAL TESTS:  Slump test: Negative  FUNCTIONAL TESTS:  5 times sit to stand: 11/9:   15.2sec      at evaluation: 17.5 sec 2 minute walk test: 306 feet no AD  GAIT: Distance walked: 306 feet no AD Assistive device utilized: None Level of assistance: Complete Independence Comments: Very erratic; no consistent gait pattern, changing speed, veering, frequently, wide and narrow BOS but inconsistent throughout distance. Not ataxic.   TODAY'S TREATMENT  04/16/22 Squat heelraise 10X with HHA Vector stance 5X5" with 1 HHA Warrior II 3X30" each side Warrior I 3X30" each  side  04/12/22 Warrior II 2x 30" Warrior I 2x 30" Squat then heel raise with cueing for form/mechanics 10x 3" Vector stance 3x 5" 1 HHA  04/05/22 Supine: LTR x 20 Transverse abdominus contraction 5" x 10 Transverse abdominus contraction with march x 10 each Bridge 3" hold x 10  Quadruped: Cat/camel x 10; 5 sec hold Childs pose x 5; 30 sec hold  Sit  to stand from lowered mat table x 10 no UE assist     04/03/22 Educated on breathing to calm CNS for stress Quadruped:  Cat/camel 10x 10" Hip extension 5x 5" Child's pose 3x 30"  Squats front of chair for mechanics 10x  03/27/22 STS X10 no HHA eccentric control Standing: heelraises 20X  Squats no added weight 10X  Alternating high march holds 10X   03/19/22 STS 2x10 no HHA eccentric control Standing:  Heel raise 2X10 Squat front of chair 2# DB 3x10  Alternating high march forward 3X10 Seated LAQ 3# AW 3x10 Standing HS curl 3# 2x10  36/6/29 Progress note/certification MMT/ROM testing (see above) FOTO 11/9: 36%   evaluation:  29% 5 times sit to stand: 11/9: 15.2sec   at evaluation: 17.5 sec  02/27/22 STS 2x10 no HHA eccentric control Standing:  Heel raise 2X10 Squat front of chair 2x10  Forward step ups 4" 10X with 1 UE assist Alternating high march 2X10 Seated piriformis stretch 30" each, hip flexor stretch 30" each  Leg extension hamstring stretch 20" each LE    PATIENT EDUCATION:  Education details: findings, safety awareness, POC, PT role Person educated: Patient Education method: Explanation Education comprehension: verbalized understanding  HOME EXERCISE PROGRAM: Access Code: [email or web address] URL: [email or web address] 04/03/22: Cat/camel   02/20/22: STS Hip abd Hip extension  02/13/22 - Hooklying Gluteal Sets  - 1 x daily - 7 x weekly - 1 sets - 10 reps - 3-5 second hold  Date: 02/12/2022 Prepared by: [facility]  Exercises - Clamshell  - 1 x daily - 7 x weekly - 1 sets - 10  reps - Supine Lower Trunk Rotation  - 1 x daily - 7 x weekly - 1 sets - 10 reps - Supine Transversus Abdominis Bracing - Hands on Stomach  - 1 x daily - 7 x weekly - 1 sets - 10 reps - 5 sec hold - Supine Bridge  - 1 x daily - 7 x weekly - 1 sets - 10 reps - Supine March  - 1 x daily - 7 x weekly - 1 sets - 10 reps  ASSESSMENT:  CLINICAL IMPRESSION: Continued to work on LE strength and stability exercises.  Able to increase reps of vectors an complete additional set of warrior poses. Feels the yoga exercises are helpful in improving her we have been completing last couple of sessions.    Warrior I much more challenging with Lt leading than II. Pt required SBA and intermittent HHA required for safety.  EOS no reports of pain, was limited by some fatigue.     OBJECTIVE IMPAIRMENTS Abnormal gait, decreased activity tolerance, decreased balance, decreased endurance, decreased knowledge of condition, decreased knowledge of use of DME, decreased mobility, difficulty walking, decreased ROM, decreased strength, decreased safety awareness, hypomobility, impaired flexibility, impaired sensation, improper body mechanics, postural dysfunction, and pain.   ACTIVITY LIMITATIONS carrying, lifting, bending, sitting, standing, squatting, stairs, and locomotion level  PARTICIPATION LIMITATIONS: cleaning, laundry, driving, shopping, community activity, and yard work  PERSONAL FACTORS Age, Behavior pattern, and Time since onset of injury/illness/exacerbation are also affecting patient's functional outcome.   REHAB POTENTIAL: Good  CLINICAL DECISION MAKING: Stable/uncomplicated  EVALUATION COMPLEXITY: Low   GOALS: Goals reviewed with patient? Yes  SHORT TERM GOALS: Target date: 02/19/22  Patient will be independent with initial HEP and self-management strategies to improve functional outcomes Baseline: Initiated Goal status: IN  PROGRESS    LONG TERM GOALS: Target date: 03/12/22  Patient will be  independent with advanced HEP and self-management strategies to improve functional outcomes Baseline:  Goal status: IN PROGRESS  2.  Patient will improve FOTO score to predicted value of 45% to indicate improvement in functional outcomes Baseline: 29% Goal status: IN PROGRESS  3.  Patient will report reduction of back pain to <3/10 for improved quality of life and ability to perform ADLs  Baseline: 8/10 Goal status: IN PROGRESS  4. Patient will have equal to or > 4+/5 MMT throughout BIL LEs to improve ability to perform functional mobility, stair ambulation and ADLs.  Baseline: See above Goal status: IN PROGRESS   PLAN: PT FREQUENCY: 2x/week  PT DURATION: 6 weeks  PLANNED INTERVENTIONS: Therapeutic exercises, Therapeutic activity, Neuromuscular re-education, Balance training, Gait training, Patient/Family education, Self Care, Joint mobilization, Joint manipulation, Stair training, DME instructions, Dry Needling, Electrical stimulation, Spinal manipulation, Spinal mobilization, Cryotherapy, Moist heat, Taping, Traction, Ultrasound, Ionotophoresis 10m/ml Dexamethasone, Manual therapy, and Re-evaluation.  PLAN FOR NEXT SESSION: Core and LE strengthening, gait and balance training as needed. Progress standing stability exercises.   ATeena Irani PTA/CLT CTuluksakPh: [phone] 1:47 PM, 04/16/22

## 2022-04-18 ENCOUNTER — Encounter (HOSPITAL_COMMUNITY): Payer: Medicare Other | Admitting: Physical Therapy

## 2022-04-18 ENCOUNTER — Ambulatory Visit (HOSPITAL_COMMUNITY): Payer: Medicare Other | Admitting: Physical Therapy

## 2022-04-18 DIAGNOSIS — M6281 Muscle weakness (generalized): Secondary | ICD-10-CM

## 2022-04-18 DIAGNOSIS — M545 Low back pain, unspecified: Secondary | ICD-10-CM

## 2022-04-18 DIAGNOSIS — R262 Difficulty in walking, not elsewhere classified: Secondary | ICD-10-CM

## 2022-04-18 DIAGNOSIS — M5416 Radiculopathy, lumbar region: Secondary | ICD-10-CM

## 2022-04-18 NOTE — Therapy (Signed)
OUTPATIENT PHYSICAL THERAPY THORACOLUMBAR TREATMENT      Patient Name: [name] [name] MRN: [id number] DOB:10/31/1955, 66 y.o., female Today's Date: 04/18/2022   PT End of Session - 04/18/22 1440    Visit Number 15    Number of Visits 18    Date for PT Re-Evaluation 04/25/22    Authorization Type UHC Medicare (Medicaid [place] secondary)    Authorization Time Period (No auth, no LV)    Progress Note Due on Visit 18    PT Start Time 1358   pt late   PT Stop Time 1436    PT Time Calculation (min) 38 min    Activity Tolerance Patient tolerated treatment well    Behavior During Therapy [facility] for tasks assessed/performed                   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   Crohn disease ([place])    CTS (carpal tunnel syndrome)    Diarrhea MULITIFACTORIAL   IBS, LACTOSE INTOLERANCE, SBBO, BILE-SALT   Elevated liver enzymes 2009: BMI 24 ?Etoh ALT 94, AST 51,  NEG ANA, qIGs& ASMA   MAY 2012 AST 52 ALT 38   Esophageal stricture 2009   GERD (gastroesophageal reflux disease)    Hemorrhoid    Hyperlipemia    Hypertension    Inflammatory bowel disease 2006 CD   SURGICAL REMISSION   LBP (low back pain)    Mental disorder    Migraine    Osteoporosis    PONV (postoperative nausea and vomiting)    Shortness of breath    exertion and humidity   Shoulder pain    right   Sleep apnea    Stop Bang Cock score of 4   Past Surgical History:  Procedure Laterality Date   BACK SURGERY     BACK SURGERY  07/16/2018   BILATERAL SALPINGOOPHORECTOMY     BIOPSY  12/24/2011   Procedure: BIOPSY;  Surgeon: Danie Binder, MD;  Location: [facility];  Service: Endoscopy;;  Gastric Biopsies   BIOPSY N/A 06/30/2012   Procedure: BIOPSY;  Surgeon: Danie Binder, MD;  Location: [facility];  Service: Endoscopy;   Laterality: N/A;  Gastric and Esophageal Biopsies   CARPAL TUNNEL RELEASE  LEFT   CATARACT EXTRACTION W/PHACO Right 07/30/2016   Procedure: CATARACT EXTRACTION PHACO AND INTRAOCULAR LENS PLACEMENT ([place]);  Surgeon: Rutherford Guys, MD;  Location: [facility];  Service: Ophthalmology;  Laterality: Right;  CDE: 5.45   CATARACT EXTRACTION W/PHACO Left 08/13/2016   Procedure: CATARACT EXTRACTION PHACO AND INTRAOCULAR LENS PLACEMENT ([facility]);  Surgeon: Rutherford Guys, MD;  Location: [facility];  Service: Ophthalmology;  Laterality: Left;  CDE: 5.59   COLON SURGERY     COLONOSCOPY  JAN 2009 DIARRHEA, ABD PAIN, BRBPR   ANASTOMOTIC ULCER(3MM), IH WU:[id number] ULCER, NL COLON bX   COLONOSCOPY WITH PROPOFOL N/A 04/21/2018   examined portion of the ileum normal, two 3-5 mm polyps, diverticulosis in the rectosigmoid and sigmoid colon, external and internal hemorrhoids, significant looping of the colon.  Pathology with tubular adenomas.  Recommendations to follow high-fiber diet and repeat colonoscopy in 5 to 10 years with peds colonoscope.   DILATION AND CURETTAGE OF UTERUS     ESOPHAGEAL DILATION  12/24/2011   SLF: A stricture was found in the distal esophagus/ Moderate gastritis/ MILD Duodenitis   ESOPHAGOGASTRODUODENOSCOPY  02/07/09   mild gastritis   ESOPHAGOGASTRODUODENOSCOPY (EGD) WITH PROPOFOL N/A 06/30/2012   SLF: 1. No definite stricture appreciated 2. small hiatal hernia 3. Gastritis   ESOPHAGOGASTRODUODENOSCOPY (EGD) WITH PROPOFOL N/A 02/20/2016   Procedure: ESOPHAGOGASTRODUODENOSCOPY (EGD) WITH PROPOFOL;  Surgeon: Danie Binder, MD;  [facility];  Service: Endoscopy;  Laterality: N/A;  930   ESOPHAGOGASTRODUODENOSCOPY (EGD) WITH PROPOFOL N/A 11/24/2018   benign-appearing esophageal peptic stricture s/p dilation, small hiatal hernia, moderate erosive gastritis.    EYE SURGERY     gum flap Bilateral    HEMICOLECTOMY  RIGHT 2006   HERNIA REPAIR     LUMBAR DISC SURGERY     POLYPECTOMY  04/21/2018    Procedure: POLYPECTOMY;  Surgeon: Danie Binder, MD;  [facility];  Service: Endoscopy;;  (colon)   SAVORY DILATION N/A 06/30/2012   Procedure: SAVORY DILATION;  Surgeon: Danie Binder, MD;  Location: [facility];  Service: Endoscopy;  Laterality: N/A;  12.24m , 198m 1566m SAVORY DILATION N/A 02/20/2016   Procedure: SAVORY DILATION;  Surgeon: SanDanie BinderD;  [facility];  Service: Endoscopy;  Laterality: N/A;   SAVORY DILATION N/A 11/24/2018   Procedure: SAVORY DILATION;  Surgeon: FieDanie BinderD;  [facility];  Service: Endoscopy;  Laterality: N/A;   UPPER GASTROINTESTINAL ENDOSCOPY  JAN 2009 ABD PAIN   GASTRITIS, ESO RING   UPPER GASTROINTESTINAL ENDOSCOPY  OCT 2010 ABD PAIN, DYSPHAGIA   DIL 15MM, GASTRITIS, NL DUODENUM   UPPER GASTROINTESTINAL ENDOSCOPY  OCT 2010 DYSPHAGIA   DIL 17 MM, GASTRITIS, NL DUODENUM   vocal cord surgery  Sep 20, 2010   precancerous areas removed   wisdom tooth extraction Right    pin placement due to broken jaw   Patient Active Problem List   Diagnosis Date Noted   Encounter for general adult medical examination with abnormal findings 12/27/2021   Rash 08/14/2021   Hypothyroidism 12/13/2020   S/P lumbar fusion 10/19/2019   Seasonal allergies 09/01/2018   Chronic low back pain 04/07/2018   Perianal cyst 02/24/2018   Alcohol abuse 12/23/2017   Tobacco abuse 06/25/2017   Osteoarthritis 12/27/2014   Allergic dermatitis eyelid 12/27/2014   Abnormal CT scan, chest 01/12/2014   Polycythemia 09/08/2012   Vertigo 01/22/2012   Neck pain 01/22/2012   Chronic pain syndrome 12/27/2011   Diarrhea, episodic 12/27/2011   Edema of larynx 04/22/2011   Dysphagia 03/14/2011   DEPRESSION 04/24/2010   Chronic bronchitis ([place]9/16/2011   Osteoporosis 11/11/2008   Spondylolisthesis, lumbar region 07/28/2008   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/2007   DEGENERATIVE DISGullySEASE, CERVICAL SPINE 06/29/2007    Hyperlipidemia 02/03/2007   VARCIES, SITE NEC 09/30/2006   Anxiety state 02/27/2006   Essential hypertension 02/27/2006   GERD 02/27/2006   Crohn's disease of small intestine with complication ([id number] PCP: RutIhor DowD  REFERRING PROVIDER: RutIhor DowD  REFERRING DIAG: M43.16 (ICD-10-CM) - Spondylolisthesis, lumbar region   Rationale for Evaluation and Treatment Rehabilitation  THERAPY DIAG:  Low back pain, unspecified back pain laterality, unspecified chronicity, unspecified whether sciatica present  Difficulty in walking, not elsewhere classified  Muscle weakness (generalized)  Radiculopathy, lumbar region  ONSET DATE: June 2023  SUBJECTIVE:  SUBJECTIVE STATEMENT: Pt states that she is walking better.  Her pain depends on what she does.  She is working on her exercises at home.  PERTINENT HISTORY:  Chron's disease  PAIN:  Are you having pain? Yes: NPRS scale: 3/10 Pain location: lower back Pain description: sore Aggravating factors: sweeping, too much activity Relieving factors: medication   PRECAUTIONS: None  WEIGHT BEARING RESTRICTIONS No  FALLS:  Has patient fallen in last 6 months? No  LIVING ENVIRONMENT: Lives with: lives with their family and lives with their spouse Lives in: House/apartment Stairs: Yes: External: 4 steps; on right going up Has following equipment at home: Single point cane walking stick  OCCUPATION: Does not work  PLOF: Independent  PATIENT GOALS Be able to wash her windows, clean house with less pain. Do some yard work.   OBJECTIVE:   DIAGNOSTIC FINDINGS:  IMPRESSION: 1. Status post L4-L5 fusion. No spinal canal stenosis. Susceptibility artifact from hardware obscures the neural foramina. 2. Mild left neural foraminal  narrowing at L3-L4, and possibly at L5-S1.   On: 10/12/202  PATIENT SURVEYS:  FOTO 11/9: 36%   evaluation:  29%  SCREENING FOR RED FLAGS: Bowel or bladder incontinence: No Spinal tumors: No Cauda equina syndrome: No Compression fracture: No Abdominal aneurysm: No  SENSATION: Light touch: Impaired  Rt foot, reports hx of neuropathy    POSTURE: rounded shoulders, forward head, and increased thoracic kyphosis   LUMBAR ROM:   Active  A/PROM  eval A/PROM 03/14/22  Flexion Mid shin Mid shin (can't go further due to vertigo)  Extension Limited 50% WNL  Right lateral flexion Top of patella Mid shin  Left lateral flexion Past mid jointline Mid shin  Right rotation wfl WFL  Left rotation wfl WFL   (Blank rows = not tested)   LOWER EXTREMITY MMT:    MMT (Low effort) Right eval Right 03/14/22 Left eval Left 03/14/22  Hip flexion 4 4+ in supine 4 3 in supine  Hip extension 2 3+ in prone 2 3+ in prone  Hip abduction 4- 4- in sidelying 4- 3+in sidelying  Hip adduction 4 4 in sidelying 4 3 in sidelying  Hip internal rotation      Hip external rotation      Knee flexion 3 4+ in prone 3 4+ in prone  Knee extension 3+ 5 5 5   Ankle dorsiflexion 4- 4+ 4+ 5  Ankle plantarflexion      Ankle inversion      Ankle eversion       (Blank rows = not tested)  LUMBAR SPECIAL TESTS:  Slump test: Negative  FUNCTIONAL TESTS:  5 times sit to stand: 11/9:   15.2sec      at evaluation: 17.5 sec 2 minute walk test: 306 feet no AD  GAIT: Distance walked: 306 feet no AD Assistive device utilized: None Level of assistance: Complete Independence Comments: Very erratic; no consistent gait pattern, changing speed, veering, frequently, wide and narrow BOS but inconsistent throughout distance. Not ataxic.   TODAY'S TREATMENT  04/18/22            Wall arch x 10            Back to wall keeping back stabilized B UE flexion x 10            Functional squat x 10            UE PNF 1 with red  thera-band B x 10  Vector stance 5 x 5"            Warrior I, II and tree pose x 3 for 30"  04/16/22 Squat heelraise 10X with HHA Vector stance 5X5" with 1 HHA Warrior II 3X30" each side Warrior I 3X30" each side  04/12/22 Warrior II 2x 30" Warrior I 2x 30" Squat then heel raise with cueing for form/mechanics 10x 3" Vector stance 3x 5" 1 HHA  04/05/22 Supine: LTR x 20 Transverse abdominus contraction 5" x 10 Transverse abdominus contraction with march x 10 each Bridge 3" hold x 10  Quadruped: Cat/camel x 10; 5 sec hold Childs pose x 5; 30 sec hold  Sit  to stand from lowered mat table x 10 no UE assist     04/03/22 Educated on breathing to calm CNS for stress Quadruped:  Cat/camel 10x 10" Hip extension 5x 5" Child's pose 3x 30"  Squats front of chair for mechanics 10x  03/27/22 STS X10 no HHA eccentric control Standing: heelraises 20X  Squats no added weight 10X  Alternating high march holds 10X   03/19/22 STS 2x10 no HHA eccentric control Standing:  Heel raise 2X10 Squat front of chair 2# DB 3x10  Alternating high march forward 3X10 Seated LAQ 3# AW 3x10 Standing HS curl 3# 2x10  86/5/78 Progress note/certification MMT/ROM testing (see above) FOTO 11/9: 36%   evaluation:  29% 5 times sit to stand: 11/9: 15.2sec   at evaluation: 17.5 sec  02/27/22 STS 2x10 no HHA eccentric control Standing:  Heel raise 2X10 Squat front of chair 2x10  Forward step ups 4" 10X with 1 UE assist Alternating high march 2X10 Seated piriformis stretch 30" each, hip flexor stretch 30" each  Leg extension hamstring stretch 20" each LE    PATIENT EDUCATION:  Education details: findings, safety awareness, POC, PT role Person educated: Patient Education method: Explanation Education comprehension: verbalized understanding  HOME EXERCISE PROGRAM: Access Code: [email or web address] URL: [email or web address] 04/03/22: Cat/camel   02/20/22: STS Hip  abd Hip extension  02/13/22 - Hooklying Gluteal Sets  - 1 x daily - 7 x weekly - 1 sets - 10 reps - 3-5 second hold  Date: 02/12/2022 Prepared by: [facility] - [facility]  Exercises - Clamshell  - 1 x daily - 7 x weekly - 1 sets - 10 reps - Supine Lower Trunk Rotation  - 1 x daily - 7 x weekly - 1 sets - 10 reps - Supine Transversus Abdominis Bracing - Hands on Stomach  - 1 x daily - 7 x weekly - 1 sets - 10 reps - 5 sec hold - Supine Bridge  - 1 x daily - 7 x weekly - 1 sets - 10 reps - Supine March  - 1 x daily - 7 x weekly - 1 sets - 10 reps  ASSESSMENT:  CLINICAL IMPRESSION: Added wall arch and tree pose to program for improved posture and core strength. Pt feels that she will be ready for discharge following her last three visits.  Pt feels the stretching and yoga do better than the strengthening exercises for her pain.   Continued to work on LE strength and stability exercises.    OBJECTIVE IMPAIRMENTS Abnormal gait, decreased activity tolerance, decreased balance, decreased endurance, decreased knowledge of condition, decreased knowledge of use of DME, decreased mobility, difficulty walking, decreased ROM, decreased strength, decreased safety awareness, hypomobility, impaired flexibility, impaired sensation, improper body mechanics, postural dysfunction, and pain.   ACTIVITY LIMITATIONS carrying, lifting, bending, sitting, standing, squatting, stairs,  and locomotion level  PARTICIPATION LIMITATIONS: cleaning, laundry, driving, shopping, community activity, and yard work  PERSONAL FACTORS Age, Behavior pattern, and Time since onset of injury/illness/exacerbation are also affecting patient's functional outcome.   REHAB POTENTIAL: Good  CLINICAL DECISION MAKING: Stable/uncomplicated  EVALUATION COMPLEXITY: Low   GOALS: Goals reviewed with patient? Yes  SHORT TERM GOALS: Target date: 02/19/22  Patient will be independent with initial HEP and self-management strategies to improve  functional outcomes Baseline: Initiated Goal status: IN PROGRESS    LONG TERM GOALS: Target date: 03/12/22  Patient will be independent with advanced HEP and self-management strategies to improve functional outcomes Baseline:  Goal status: IN PROGRESS  2.  Patient will improve FOTO score to predicted value of 45% to indicate improvement in functional outcomes Baseline: 29% Goal status: IN PROGRESS  3.  Patient will report reduction of back pain to <3/10 for improved quality of life and ability to perform ADLs  Baseline: 8/10 Goal status: IN PROGRESS  4. Patient will have equal to or > 4+/5 MMT throughout BIL LEs to improve ability to perform functional mobility, stair ambulation and ADLs.  Baseline: See above Goal status: IN PROGRESS   PLAN: PT FREQUENCY: 2x/week  PT DURATION: 6 weeks  PLANNED INTERVENTIONS: Therapeutic exercises, Therapeutic activity, Neuromuscular re-education, Balance training, Gait training, Patient/Family education, Self Care, Joint mobilization, Joint manipulation, Stair training, DME instructions, Dry Needling, Electrical stimulation, Spinal manipulation, Spinal mobilization, Cryotherapy, Moist heat, Taping, Traction, Ultrasound, Ionotophoresis 33m/ml Dexamethasone, Manual therapy, and Re-evaluation.  PLAN FOR NEXT SESSION: Core and LE strengthening, gait and balance training as needed. Progress standing stability exercises.  CRayetta Humphrey [phone]

## 2022-05-03 ENCOUNTER — Encounter: Payer: Self-pay | Admitting: Family Medicine

## 2022-05-03 ENCOUNTER — Ambulatory Visit (INDEPENDENT_AMBULATORY_CARE_PROVIDER_SITE_OTHER): Payer: Medicare Other | Admitting: Family Medicine

## 2022-05-03 VITALS — BP 135/75 | HR 86 | Ht 60.0 in | Wt 106.0 lb

## 2022-05-03 DIAGNOSIS — S6991XA Unspecified injury of right wrist, hand and finger(s), initial encounter: Secondary | ICD-10-CM

## 2022-05-03 DIAGNOSIS — M79644 Pain in right finger(s): Secondary | ICD-10-CM | POA: Diagnosis not present

## 2022-05-03 MED ORDER — THUMB BRACE MISC: 1.0000 | Freq: Once | 3 refills | Status: AC

## 2022-05-03 NOTE — Progress Notes (Signed)
Acute Office Visit  Subjective:     Patient ID: [name] [name], female    DOB: 1955-12-04, 66 y.o.   MRN: [id number]  Chief Complaint  Patient presents with   Hand Pain    Patient states she was cleaning her toilet and the seat fell down and hit her R thumb a week ago. Has continued swelling and pain.     Hand Pain  The incident occurred 5 to 7 days ago. Incident location: toilet lid fell  down on right thumb. The injury mechanism was a direct blow. The pain is present in the right hand (thumb). Quality: throbbing. The pain does not radiate. The pain is at a severity of 4/10. The pain is moderate. The pain has been Intermittent since the incident. Pertinent negatives include no numbness. The symptoms are aggravated by movement. She has tried rest (numbing agent, ice, percocet) for the symptoms. The treatment provided significant relief.     Review of Systems  Constitutional:  Negative for chills and fever.  Respiratory:  Negative for shortness of breath.   Musculoskeletal:  Negative for joint pain.  Skin:  Negative for itching and rash.  Neurological:  Negative for numbness.        Objective:    BP 135/75   Pulse 86   Ht 5' (1.524 m)   Wt 106 lb (48.1 kg)   SpO2 94%   BMI 20.70 kg/m  BP Readings from Last 3 Encounters:  05/03/22 135/75  12/27/21 128/68  12/07/21 124/74      Physical Exam Vitals reviewed.  Cardiovascular:     Rate and Rhythm: Normal rate.     Pulses: Normal pulses.     Heart sounds: Normal heart sounds.  Pulmonary:     Effort: Pulmonary effort is normal.     Breath sounds: Normal breath sounds.  Musculoskeletal:        General: Tenderness present. No swelling or deformity.     Right hand: Tenderness and bony tenderness present. No swelling, deformity or lacerations. Decreased range of motion. Decreased strength. Normal strength of finger abduction, thumb/finger opposition and wrist extension. Normal sensation. There is no disruption of  two-point discrimination. Normal capillary refill. Normal pulse.     Left hand: Normal. No swelling, deformity, lacerations, tenderness or bony tenderness. Normal range of motion. Normal strength. Normal strength of finger abduction, thumb/finger opposition and wrist extension. Normal sensation. There is no disruption of two-point discrimination. Normal capillary refill. Normal pulse.     Comments: Tenderness on right thumb and hand  Skin:    General: Skin is warm and dry.     Capillary Refill: Capillary refill takes less than 2 seconds.  Neurological:     Mental Status: She is alert and oriented to person, place, and time. Mental status is at baseline.  Psychiatric:        Mood and Affect: Mood normal.     No results found for any visits on 05/03/22.      Assessment & Plan:   Problem List Items Addressed This Visit       Musculoskeletal and Integument   Injury to thumb (nail), right, initial encounter    Right thumb is moderately swollen laterally and anteriorly with tenderness to palpation but no erythema, warmth or obvious deformity. Color, and range of motion, sensation are intact. Patient stated percocet helps with the pain. Advice patient to wear a thumb brace,apply ice on injured thumb, and to continued to take at percocet pills she  has at home for the pain.      Other Visit Diagnoses     Injury of right thumb, initial encounter    -  Primary       Meds ordered this encounter  Medications   Elastic Bandages & Supports (THUMB BRACE) MISC    Sig: 1 Application by Does not apply route once for 1 dose.    Dispense:  1 each    Refill:  3    No follow-ups on file.  Cruzita Lederer Newman Nip, FNP

## 2022-05-03 NOTE — Patient Instructions (Addendum)
Please apply Thumb brace on injured hand. Continue applying Ice and  pain medications you already have at home.   If symptoms get worse Contact your Health Care Provider or visit the Emergency room

## 2022-05-05 DIAGNOSIS — S6991XA Unspecified injury of right wrist, hand and finger(s), initial encounter: Secondary | ICD-10-CM | POA: Insufficient documentation

## 2022-05-05 NOTE — Assessment & Plan Note (Signed)
Right thumb is moderately swollen laterally and anteriorly with tenderness to palpation but no erythema, warmth or obvious deformity. Color, and range of motion, sensation are intact. Patient stated percocet helps with the pain. Advice patient to wear a thumb brace,apply ice on injured thumb, and to continued to take at percocet pills she has at home for the pain.

## 2022-05-14 ENCOUNTER — Other Ambulatory Visit: Payer: Self-pay | Admitting: Internal Medicine

## 2022-05-14 DIAGNOSIS — I10 Essential (primary) hypertension: Secondary | ICD-10-CM

## 2022-05-21 ENCOUNTER — Other Ambulatory Visit: Payer: Self-pay | Admitting: Internal Medicine

## 2022-05-21 DIAGNOSIS — J410 Simple chronic bronchitis: Secondary | ICD-10-CM

## 2022-05-21 DIAGNOSIS — J302 Other seasonal allergic rhinitis: Secondary | ICD-10-CM

## 2022-05-28 DIAGNOSIS — Z79899 Other long term (current) drug therapy: Secondary | ICD-10-CM | POA: Diagnosis not present

## 2022-05-28 DIAGNOSIS — M4326 Fusion of spine, lumbar region: Secondary | ICD-10-CM | POA: Diagnosis not present

## 2022-05-28 DIAGNOSIS — M5442 Lumbago with sciatica, left side: Secondary | ICD-10-CM | POA: Diagnosis not present

## 2022-05-28 DIAGNOSIS — M549 Dorsalgia, unspecified: Secondary | ICD-10-CM | POA: Diagnosis not present

## 2022-05-28 DIAGNOSIS — Z79891 Long term (current) use of opiate analgesic: Secondary | ICD-10-CM | POA: Diagnosis not present

## 2022-05-28 DIAGNOSIS — G894 Chronic pain syndrome: Secondary | ICD-10-CM | POA: Diagnosis not present

## 2022-06-24 ENCOUNTER — Other Ambulatory Visit: Payer: Self-pay | Admitting: Internal Medicine

## 2022-06-24 DIAGNOSIS — J302 Other seasonal allergic rhinitis: Secondary | ICD-10-CM

## 2022-07-01 ENCOUNTER — Ambulatory Visit (INDEPENDENT_AMBULATORY_CARE_PROVIDER_SITE_OTHER): Payer: 59 | Admitting: Internal Medicine

## 2022-07-01 ENCOUNTER — Encounter: Payer: Self-pay | Admitting: Internal Medicine

## 2022-07-01 VITALS — BP 113/74 | HR 94 | Ht 60.0 in | Wt 105.2 lb

## 2022-07-01 DIAGNOSIS — K219 Gastro-esophageal reflux disease without esophagitis: Secondary | ICD-10-CM | POA: Diagnosis not present

## 2022-07-01 DIAGNOSIS — Z720 Tobacco use: Secondary | ICD-10-CM

## 2022-07-01 DIAGNOSIS — D751 Secondary polycythemia: Secondary | ICD-10-CM | POA: Diagnosis not present

## 2022-07-01 DIAGNOSIS — J441 Chronic obstructive pulmonary disease with (acute) exacerbation: Secondary | ICD-10-CM

## 2022-07-01 DIAGNOSIS — I10 Essential (primary) hypertension: Secondary | ICD-10-CM

## 2022-07-01 DIAGNOSIS — K50019 Crohn's disease of small intestine with unspecified complications: Secondary | ICD-10-CM

## 2022-07-01 DIAGNOSIS — E039 Hypothyroidism, unspecified: Secondary | ICD-10-CM

## 2022-07-01 DIAGNOSIS — F17210 Nicotine dependence, cigarettes, uncomplicated: Secondary | ICD-10-CM

## 2022-07-01 DIAGNOSIS — B370 Candidal stomatitis: Secondary | ICD-10-CM | POA: Diagnosis not present

## 2022-07-01 MED ORDER — METHYLPREDNISOLONE ACETATE 80 MG/ML IJ SUSP
40.0000 mg | Freq: Once | INTRAMUSCULAR | Status: AC
  Administered 2022-07-01: 40 mg via INTRAMUSCULAR

## 2022-07-01 MED ORDER — NYSTATIN 100000 UNIT/ML MT SUSP: OROMUCOSAL | 0 refills | Status: DC

## 2022-07-01 MED ORDER — AMLODIPINE BESYLATE 10 MG PO TABS: 10.0000 mg | ORAL_TABLET | Freq: Every day | ORAL | 3 refills | Status: DC

## 2022-07-01 MED ORDER — AZITHROMYCIN 250 MG PO TABS: ORAL_TABLET | ORAL | 0 refills | Status: AC

## 2022-07-01 MED ORDER — METHYLPREDNISOLONE 4 MG PO TBPK: ORAL_TABLET | ORAL | 0 refills | Status: DC

## 2022-07-01 NOTE — Assessment & Plan Note (Signed)
BP Readings from Last 1 Encounters:  07/01/22 113/74   Well-controlled with Amlodipine 10 mg QD Counseled for compliance with the medications Advised DASH diet and moderate exercise/walking, at least 150 mins/week

## 2022-07-01 NOTE — Patient Instructions (Addendum)
Please start taking Azithromycin and Prednisone as prescribed.  Please continue taking Tessalon as needed for cough.  Please try to cut down -> quit smoking.  Please continue taking other medications as prescribed.  Please get blood tests done once your cough improves.

## 2022-07-01 NOTE — Progress Notes (Signed)
Established Patient Office Visit  Subjective:  Patient ID: [name] [name], female    DOB: [birth year]  Age: 67 y.o. MRN: [id number]  CC:  Chief Complaint  Patient presents with   Hypertension    Six week follow up. Patient states she's been sick, coughing up yellow and green mucus    HPI [name] [name] is a 67 y.o. female with past medical history of hypertension, Crohn's disease status post partial bowel resection, hypothyroidism, GERD, DJD of cervical and lumbar spine, s/p lumbar fusion surgery, chronic pain syndrome and tobacco abuse who presents for f/u of her chronic medical conditions.  COPD: She complains of worsening of cough with yellowish-green expectoration for the last 1 week.  Denies any fever or chills.  She has mild dyspnea and wheezing.  She uses Symbicort and as needed albuterol currently.  She still smokes about 3-4 cigarettes per day.  HTN: BP is well-controlled. Takes medications regularly. Patient denies headache, dizziness, chest pain, dyspnea or palpitations.  She has history of Crohn's disease, but she is not on immunosuppressives. She has tried different regimens in the past. She was given Bentyl recently, but did not tolerate it. She follows up with [facility] gastroenterology. She denies any melena or hematochezia currently, but has chronic abdominal pain. She used to take opioid medications for chronic abdominal pain as well.  She had not been tolerating Levothyroxine. Her TSH was well-controlled in the last visit. Denies any tremors or palpitations.     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   Crohn disease ([place])    CTS (carpal tunnel syndrome)    Diarrhea MULITIFACTORIAL   IBS, LACTOSE INTOLERANCE, SBBO, BILE-SALT   Elevated liver enzymes 2009: BMI 24 ?Etoh ALT 94, AST 51,   NEG ANA, qIGs& ASMA   MAY 2012 AST 52 ALT 38   Esophageal stricture 2009   GERD (gastroesophageal reflux disease)    Hemorrhoid    Hyperlipemia    Hypertension    Inflammatory bowel disease 2006 CD   SURGICAL REMISSION   LBP (low back pain)    Mental disorder    Migraine    Osteoporosis    PONV (postoperative nausea and vomiting)    Shortness of breath    exertion and humidity   Shoulder pain    right   Sleep apnea    Stop Bang Cock score of 4    Past Surgical History:  Procedure Laterality Date   BACK SURGERY     BACK SURGERY  07/16/2018   BILATERAL SALPINGOOPHORECTOMY     BIOPSY  12/24/2011   Procedure: BIOPSY;  Surgeon: Danie Binder, MD;  Location: [facility];  Service: Endoscopy;;  Gastric Biopsies   BIOPSY N/A 06/30/2012   Procedure: BIOPSY;  Surgeon: Danie Binder, MD;  Location: [facility];  Service: Endoscopy;  Laterality: N/A;  Gastric and Esophageal Biopsies   CARPAL TUNNEL RELEASE  LEFT   CATARACT EXTRACTION W/PHACO Right 07/30/2016   Procedure: CATARACT EXTRACTION PHACO AND INTRAOCULAR LENS PLACEMENT ([place]);  Surgeon: Rutherford Guys, MD;  Location: [facility];  Service: Ophthalmology;  Laterality: Right;  CDE: 5.45   CATARACT EXTRACTION W/PHACO Left 08/13/2016   Procedure: CATARACT EXTRACTION PHACO AND INTRAOCULAR LENS PLACEMENT ([facility]);  Surgeon: Elta Guadeloupe  Gershon Crane, MD;  Location: [facility];  Service: Ophthalmology;  Laterality: Left;  CDE: 5.59   COLON SURGERY     COLONOSCOPY  JAN 2009 DIARRHEA, ABD PAIN, BRBPR   ANASTOMOTIC ULCER(3MM), IH [id number] ULCER, NL COLON bX   COLONOSCOPY WITH PROPOFOL N/A 04/21/2018   examined portion of the ileum normal, two 3-5 mm polyps, diverticulosis in the rectosigmoid and sigmoid colon, external and internal hemorrhoids, significant looping of the colon.  Pathology with tubular adenomas.  Recommendations to follow high-fiber diet and repeat colonoscopy in 5 to 10 years with peds colonoscope.   DILATION AND CURETTAGE OF UTERUS     ESOPHAGEAL DILATION   12/24/2011   SLF: A stricture was found in the distal esophagus/ Moderate gastritis/ MILD Duodenitis   ESOPHAGOGASTRODUODENOSCOPY  02/07/09   mild gastritis   ESOPHAGOGASTRODUODENOSCOPY (EGD) WITH PROPOFOL N/A 06/30/2012   SLF: 1. No definite stricture appreciated 2. small hiatal hernia 3. Gastritis   ESOPHAGOGASTRODUODENOSCOPY (EGD) WITH PROPOFOL N/A 02/20/2016   Procedure: ESOPHAGOGASTRODUODENOSCOPY (EGD) WITH PROPOFOL;  Surgeon: Danie Binder, MD;  [facility];  Service: Endoscopy;  Laterality: N/A;  930   ESOPHAGOGASTRODUODENOSCOPY (EGD) WITH PROPOFOL N/A 11/24/2018   benign-appearing esophageal peptic stricture s/p dilation, small hiatal hernia, moderate erosive gastritis.    EYE SURGERY     gum flap Bilateral    HEMICOLECTOMY  RIGHT 2006   HERNIA REPAIR     LUMBAR DISC SURGERY     POLYPECTOMY  04/21/2018   Procedure: POLYPECTOMY;  Surgeon: Danie Binder, MD;  [facility];  Service: Endoscopy;;  (colon)   SAVORY DILATION N/A 06/30/2012   Procedure: SAVORY DILATION;  Surgeon: Danie Binder, MD;  Location: [facility];  Service: Endoscopy;  Laterality: N/A;  12.77m , 152m 1557m SAVORY DILATION N/A 02/20/2016   Procedure: SAVORY DILATION;  Surgeon: SanDanie BinderD;  [facility];  Service: Endoscopy;  Laterality: N/A;   SAVORY DILATION N/A 11/24/2018   Procedure: SAVORY DILATION;  Surgeon: FieDanie BinderD;  [facility];  Service: Endoscopy;  Laterality: N/A;   UPPER GASTROINTESTINAL ENDOSCOPY  JAN 2009 ABD PAIN   GASTRITIS, ESO RING   UPPER GASTROINTESTINAL ENDOSCOPY  OCT 2010 ABD PAIN, DYSPHAGIA   DIL 15MM, GASTRITIS, NL DUODENUM   UPPER GASTROINTESTINAL ENDOSCOPY  OCT 2010 DYSPHAGIA   DIL 17 MM, GASTRITIS, NL DUODENUM   vocal cord surgery  Sep 20, 2010   precancerous areas removed   wisdom tooth extraction Right    pin placement due to broken jaw    Family History  Problem Relation Age of Onset   Hypothyroidism Mother    Heart  disease Father    Parkinson's disease Father    Hypothyroidism Daughter    Heart attack Paternal Grandfather    Alzheimer's disease Paternal Grandmother    Heart attack Maternal Grandfather    Crohn's disease Sister    Other Sister        was murdered   Crohn's disease Maternal Uncle    Colon cancer Neg Hx    Colon polyps Neg Hx     Social History   Socioeconomic History   Marital status: Single    Spouse name: WayPatrick JupiterNumber of children: 2   Years of education: GED   Highest education level: Not on file  Occupational History    Comment: disabled  Tobacco Use   Smoking status: Every Day    Packs/day: 0.25  Years: 30.00    Total pack years: 7.50    Types: Cigarettes   Smokeless tobacco: Never  Vaping Use   Vaping Use: Never used  Substance and Sexual Activity   Alcohol use: Not Currently    Comment: wine/beer a few times a week    Drug use: No   Sexual activity: Not Currently    Birth control/protection: Post-menopausal  Other Topics Concern   Not on file  Social History Narrative   Lives with husband   no caffiene   Social Determinants of Health   Financial Resource Strain: Low Risk  (01/14/2022)   Overall Financial Resource Strain (CARDIA)    Difficulty of Paying Living Expenses: Not hard at all  Food Insecurity: No Food Insecurity (01/14/2022)   Hunger Vital Sign    Worried About Running Out of Food in the Last Year: Never true    Ran Out of Food in the Last Year: Never true  Transportation Needs: No Transportation Needs (01/14/2022)   PRAPARE - Hydrologist (Medical): No    Lack of Transportation (Non-Medical): No  Physical Activity: Insufficiently Active (01/14/2022)   Exercise Vital Sign    Days of Exercise per Week: 2 days    Minutes of Exercise per Session: 20 min  Stress: No Stress Concern Present (01/14/2022)   [place]    Feeling of Stress : Not at all   Social Connections: Moderately Isolated (01/14/2022)   Social Connection and Isolation Panel [NHANES]    Frequency of Communication with Friends and Family: Three times a week    Frequency of Social Gatherings with Friends and Family: Once a week    Attends Religious Services: Never    Marine scientist or Organizations: No    Attends Archivist Meetings: Never    Marital Status: Married  Human resources officer Violence: Not At Risk (01/14/2022)   Humiliation, Afraid, Rape, and Kick questionnaire    Fear of Current or Ex-Partner: No    Emotionally Abused: No    Physically Abused: No    Sexually Abused: No    Outpatient Medications Prior to Visit  Medication Sig Dispense Refill   benzonatate (TESSALON) 100 MG capsule TAKE 1 CAPSULE BY MOUTH TWICE DAILY AS NEEDED FOR COUGH 30 capsule 0   calcium carbonate (TUMS - DOSED IN MG ELEMENTAL CALCIUM) 500 MG chewable tablet Chew 1 tablet (200 mg of elemental calcium total) by mouth 3 (three) times daily with meals. 90 tablet 6   clotrimazole (CLOTRIMAZOLE AF) 1 % cream Apply 1 application topically 2 (two) times daily. To affected area for 1-2 weeks 30 g 0   cyclobenzaprine (FLEXERIL) 10 MG tablet Take 10 mg by mouth as needed for muscle spasms.      dexlansoprazole (DEXILANT) 60 MG capsule 1 PO EVERY MORNING WITH BREAKFAST. 90 capsule 3   fluticasone (FLONASE) 50 MCG/ACT nasal spray USE TWO SPRAY(S) IN EACH NOSTRIL ONCE DAILY AS NEEDED FOR ALLERGY OR RHINITIS 16 g 2   gabapentin (NEURONTIN) 400 MG capsule Take 1 capsule by mouth 4 times daily 90 capsule 0   lactase (LACTAID) 3000 units tablet Take 3,000 Units by mouth daily as needed (lactose intolerance).     Lidocaine HCl 4 % LIQD Apply 1 application topically 3 (three) times daily as needed (pain).     loratadine (CLARITIN) 10 MG tablet Take 1 tablet (10 mg total) by mouth daily. 30 tablet 3  LORazepam (ATIVAN) 0.5 MG tablet Take 0.5 mg by mouth at bedtime as needed.     magnesium  hydroxide (MILK OF MAGNESIA) 400 MG/5ML suspension Take 30 mLs by mouth daily as needed for mild constipation.     Menthol 10 % AERO Apply 1 application topically 3 (three) times daily as needed (pain).     oxyCODONE-acetaminophen (PERCOCET) 10-325 MG tablet Take 1 tablet by mouth 3 (three) times daily as needed.     SYMBICORT 80-4.5 MCG/ACT inhaler Inhale 2 puffs by mouth twice daily 33 g 3   triamcinolone (KENALOG) 0.1 % Apply 1 application topically 2 (two) times daily. 30 g 0   UNABLE TO FIND Capsule to rebuild cells-BID     UNABLE TO FIND as needed. Penetrex cream     valACYclovir (VALTREX) 1000 MG tablet Take 1 tablet (1,000 mg total) by mouth 2 (two) times daily. 14 tablet 0   VENTOLIN HFA 108 (90 Base) MCG/ACT inhaler INHALE 2 PUFFS BY MOUTH EVERY 6 HOURS AS NEEDED FOR WHEEZING OR SHORTNESS OF BREATH 18 g 5   amLODipine (NORVASC) 10 MG tablet Take 1 tablet by mouth once daily 90 tablet 0   nystatin (MYCOSTATIN) 100000 UNIT/ML suspension TAKE 5 ML BY MOUTH  4 TIMES DAILY AS NEEDED 500 mL 0   No facility-administered medications prior to visit.    Allergies  Allergen Reactions   Naproxen Shortness Of Breath    Chest pain   Dicyclomine Hcl Other (See Comments)    Vision changes   Duloxetine Other (See Comments)    Suicidal    Eggs Or Egg-Derived Products Diarrhea    abd pain   Lovastatin Other (See Comments)    Chest pain   Toradol [Ketorolac Tromethamine] Other (See Comments)    Headache. Non-effective   Tramadol Other (See Comments)    Headache. Non-effective.   Nexium [Esomeprazole Magnesium] Diarrhea    Abdominal pain    ROS Review of Systems  Constitutional:  Negative for chills and fever.  HENT:  Negative for congestion, sinus pressure, sinus pain and sore throat.   Eyes:  Negative for pain and discharge.  Respiratory:  Positive for cough, shortness of breath and wheezing.   Cardiovascular:  Negative for chest pain and palpitations.  Gastrointestinal:  Positive  for abdominal pain (chronic). Negative for constipation, diarrhea, nausea and vomiting.  Endocrine: Negative for polydipsia and polyuria.  Genitourinary:  Negative for dysuria and hematuria.  Musculoskeletal:  Positive for arthralgias, back pain and neck pain. Negative for neck stiffness.  Skin:  Negative for rash.  Neurological:  Positive for numbness. Negative for dizziness and weakness.  Psychiatric/Behavioral:  Negative for agitation and behavioral problems. The patient is nervous/anxious.       Objective:    Physical Exam Vitals reviewed.  Constitutional:      General: She is not in acute distress.    Appearance: She is not diaphoretic.  HENT:     Head: Normocephalic and atraumatic.     Nose: Nose normal.     Mouth/Throat:     Mouth: Mucous membranes are moist.  Eyes:     General: No scleral icterus.    Extraocular Movements: Extraocular movements intact.  Cardiovascular:     Rate and Rhythm: Normal rate and regular rhythm.     Pulses: Normal pulses.     Heart sounds: Normal heart sounds. No murmur heard. Pulmonary:     Breath sounds: Wheezing (Mild, diffuse) present. No rales.  Abdominal:  Palpations: Abdomen is soft.     Tenderness: There is abdominal tenderness (Mild, generalized).  Musculoskeletal:        General: Tenderness (Lumbar spine area) present.     Cervical back: Neck supple. No tenderness.     Right lower leg: No edema.     Left lower leg: No edema.  Skin:    General: Skin is warm.  Neurological:     General: No focal deficit present.     Mental Status: She is alert and oriented to person, place, and time.  Psychiatric:        Mood and Affect: Mood normal.        Behavior: Behavior normal.     BP 113/74 (BP Location: Right Arm, Patient Position: Sitting, Cuff Size: Normal)   Pulse 94   Ht 5' (1.524 m)   Wt 105 lb 3.2 oz (47.7 kg)   SpO2 92%   BMI 20.55 kg/m  Wt Readings from Last 3 Encounters:  07/01/22 105 lb 3.2 oz (47.7 kg)  05/03/22  106 lb (48.1 kg)  12/27/21 106 lb 9.6 oz (48.4 kg)    Lab Results  Component Value Date   TSH 1.790 12/27/2021   Lab Results  Component Value Date   WBC 3.3 (L) 12/27/2021   HGB 16.0 (H) 12/27/2021   HCT 45.2 12/27/2021   MCV 103 (H) 12/27/2021   PLT 202 12/27/2021   Lab Results  Component Value Date   NA 137 12/27/2021   K 3.8 12/27/2021   CO2 21 12/27/2021   GLUCOSE 78 12/27/2021   BUN 5 (L) 12/27/2021   CREATININE 0.46 (L) 12/27/2021   BILITOT 0.5 12/27/2021   ALKPHOS 134 (H) 12/27/2021   AST 102 (H) 12/27/2021   ALT 41 (H) 12/27/2021   PROT 7.0 12/27/2021   ALBUMIN 4.4 12/27/2021   CALCIUM 9.2 12/27/2021   ANIONGAP 12 01/31/2020   EGFR 107 12/27/2021   Lab Results  Component Value Date   CHOL 291 (H) 12/27/2021   Lab Results  Component Value Date   HDL 183 12/27/2021   Lab Results  Component Value Date   LDLCALC 97 12/27/2021   Lab Results  Component Value Date   TRIG 73 12/27/2021   Lab Results  Component Value Date   CHOLHDL 1.6 12/27/2021   Lab Results  Component Value Date   HGBA1C 4.9 12/27/2021      Assessment & Plan:   Problem List Items Addressed This Visit    Crohn's disease of small intestine with complication ([place]) S/p partial bowel resection F/u with GI Has tried multiple medications in the past Was given Bentyl, but stopped taking it as she was having vision difficulty with it. Followed by [facility] for pain management  Chronic bronchitis ([place]) Recent COPD exacerbation Started empiric azithromycin Depo-Medrol 40 mg IM today, followed by Medrol Dosepak Continues to smoke about 3-4 cigarettes/day Continue Symbicort Albuterol PRN  Essential hypertension BP Readings from Last 1 Encounters:  07/01/22 113/74   Well-controlled with Amlodipine 10 mg QD Counseled for compliance with the medications Advised DASH diet and moderate exercise/walking, at least 150 mins/week  Tobacco abuse Smokes about 3 to 4  cigarettes/day  Asked about quitting: confirms that she currently smokes cigarettes Advise to quit smoking: Educated about QUITTING to reduce the risk of cancer, cardio and cerebrovascular disease. Assess willingness: Unwilling to quit at this time, but is working on cutting back. Assist with counseling and pharmacotherapy: Counseled for 5 minutes and  literature provided. Arrange for follow up: Follow up in 3 months and continue to offer help.  Polycythemia Chronic elevated Hb, likely due to her smoking history Will check CBC periodically  GERD Takes Dexilant F/u with GI  Hypothyroidism Lab Results  Component Value Date   TSH 1.790 12/27/2021   Was controlled with Levothyroxine 25 mcg QD, but she has stopped taking it as she thought it was contributing to headache - resolved now Check TSH and free T4   Meds ordered this encounter  Medications   amLODipine (NORVASC) 10 MG tablet    Sig: Take 1 tablet (10 mg total) by mouth daily.    Dispense:  90 tablet    Refill:  3   azithromycin (ZITHROMAX) 250 MG tablet    Sig: Take 2 tablets on day 1, then 1 tablet daily on days 2 through 5    Dispense:  6 tablet    Refill:  0   methylPREDNISolone (MEDROL DOSEPAK) 4 MG TBPK tablet    Sig: Take as package instructions.    Dispense:  1 each    Refill:  0   nystatin (MYCOSTATIN) 100000 UNIT/ML suspension    Sig: TAKE 5 ML BY MOUTH  4 TIMES DAILY AS NEEDED    Dispense:  500 mL    Refill:  0   methylPREDNISolone acetate (DEPO-MEDROL) injection 40 mg    Follow-up: Return in about 6 months (around 12/30/2022) for Annual physical.    Lindell Spar, MD

## 2022-07-01 NOTE — Assessment & Plan Note (Signed)
Smokes about 3 to 4 cigarettes/day  Asked about quitting: confirms that she currently smokes cigarettes Advise to quit smoking: Educated about QUITTING to reduce the risk of cancer, cardio and cerebrovascular disease. Assess willingness: Unwilling to quit at this time, but is working on cutting back. Assist with counseling and pharmacotherapy: Counseled for 5 minutes and literature provided. Arrange for follow up: Follow up in 3 months and continue to offer help. 

## 2022-07-01 NOTE — Assessment & Plan Note (Addendum)
Recent COPD exacerbation Started empiric azithromycin Depo-Medrol 40 mg IM today, followed by Medrol Dosepak Continues to smoke about 3-4 cigarettes/day Continue Symbicort Albuterol PRN

## 2022-07-01 NOTE — Assessment & Plan Note (Signed)
Takes Dexilant F/u with GI 

## 2022-07-01 NOTE — Assessment & Plan Note (Signed)
Chronic elevated Hb, likely due to her smoking history Will check CBC periodically 

## 2022-07-01 NOTE — Assessment & Plan Note (Signed)
Lab Results  Component Value Date   TSH 1.790 12/27/2021   Was controlled with Levothyroxine 25 mcg QD, but she has stopped taking it as she thought it was contributing to headache - resolved now Check TSH and free T4

## 2022-07-01 NOTE — Assessment & Plan Note (Addendum)
S/p partial bowel resection F/u with GI Has tried multiple medications in the past Was given Bentyl, but stopped taking it as she was having vision difficulty with it. Followed by [facility] for pain management

## 2022-07-22 ENCOUNTER — Ambulatory Visit (INDEPENDENT_AMBULATORY_CARE_PROVIDER_SITE_OTHER): Payer: 59 | Admitting: Internal Medicine

## 2022-07-22 ENCOUNTER — Encounter: Payer: Self-pay | Admitting: Internal Medicine

## 2022-07-22 VITALS — BP 126/73 | HR 103 | Ht 61.0 in | Wt 104.8 lb

## 2022-07-22 DIAGNOSIS — J411 Mucopurulent chronic bronchitis: Secondary | ICD-10-CM

## 2022-07-22 DIAGNOSIS — J441 Chronic obstructive pulmonary disease with (acute) exacerbation: Secondary | ICD-10-CM | POA: Diagnosis not present

## 2022-07-22 DIAGNOSIS — E039 Hypothyroidism, unspecified: Secondary | ICD-10-CM | POA: Diagnosis not present

## 2022-07-22 DIAGNOSIS — I10 Essential (primary) hypertension: Secondary | ICD-10-CM | POA: Diagnosis not present

## 2022-07-22 DIAGNOSIS — D751 Secondary polycythemia: Secondary | ICD-10-CM | POA: Diagnosis not present

## 2022-07-22 DIAGNOSIS — J302 Other seasonal allergic rhinitis: Secondary | ICD-10-CM | POA: Diagnosis not present

## 2022-07-22 DIAGNOSIS — B370 Candidal stomatitis: Secondary | ICD-10-CM

## 2022-07-22 MED ORDER — METHYLPREDNISOLONE 4 MG PO TBPK: ORAL_TABLET | ORAL | 0 refills | Status: DC

## 2022-07-22 MED ORDER — BENZONATATE 100 MG PO CAPS: 100.0000 mg | ORAL_CAPSULE | Freq: Two times a day (BID) | ORAL | 0 refills | Status: DC | PRN

## 2022-07-22 MED ORDER — LEVOFLOXACIN 750 MG PO TABS: 750.0000 mg | ORAL_TABLET | Freq: Every day | ORAL | 0 refills | Status: AC

## 2022-07-22 MED ORDER — NYSTATIN 100000 UNIT/ML MT SUSP: OROMUCOSAL | 0 refills | Status: DC

## 2022-07-22 NOTE — Assessment & Plan Note (Signed)
Returning to care today for persistent symptoms of COPD exacerbation.  She continues to endorse a cough productive of green sputum and has needed to use her rescue inhaler once daily for the past week.  She has previously been treated with azithromycin and a Medrol Dosepak, which did not alleviate her symptoms. -Start Levaquin 750 mg daily x 7 days -Medrol Dosepak prescribed again today -Chest x-ray and sputum culture have been ordered -Continue daily use of Symbicort and as needed albuterol -She was instructed to return to care if her symptoms worsen or fail to improve

## 2022-07-22 NOTE — Patient Instructions (Signed)
It was a pleasure to see you today.  Thank you for giving [place] the opportunity to be involved in your care.  Below is a brief recap of your visit and next steps.  We will plan to see you again in August.  Summary Levaquin and medrol dosepak have been prescribed today Tessalon perles refilled  Follow up with Dr. Posey Pronto if your symptoms do not improve

## 2022-07-22 NOTE — Progress Notes (Signed)
Acute Office Visit  Subjective:     Patient ID: [name] [name], female    DOB: 02/01/1956, 67 y.o.   MRN: [id number]  Chief Complaint  Patient presents with   Cough    Getting worse. Took abx that was prescribed. White crusty stuff comes out of eyes and watery eyes. Losing voice, been drooling at nighttime (new). Night sweats.   [name] [name] presents today for an acute visit endorsing persistent cough with green sputum production and shortness of breath.  She was previously evaluated at [facility] on 2/26 by Dr. Posey Pronto endorsing similar symptoms.  At that time she endorsed a 1 week history of cough with yellow-green sputum production x 1 week.  She was treated with azithromycin and a Medrol Dosepak.  Despite taking her medication as prescribed, [name] [name] symptoms have persisted.  She has been using Symbicort twice daily and albuterol as needed, which she reports has been once daily for the past week.  Overall she describes feeling worse and is interested in additional treatment options today.  Review of Systems  Constitutional:  Negative for chills and fever.  HENT:  Negative for sore throat.   Respiratory:  Positive for cough, sputum production, shortness of breath and wheezing.   Cardiovascular:  Negative for chest pain, palpitations and leg swelling.  Gastrointestinal:  Negative for abdominal pain, blood in stool, constipation, diarrhea, nausea and vomiting.  Genitourinary:  Negative for dysuria and hematuria.  Musculoskeletal:  Negative for myalgias.  Skin:  Negative for itching and rash.  Neurological:  Negative for dizziness and headaches.  Psychiatric/Behavioral:  Negative for depression and suicidal ideas.         Objective:    BP 126/73   Pulse (!) 103   Ht 5\' 1"  (1.549 m)   Wt 104 lb 12.8 oz (47.5 kg)   SpO2 90%   BMI 19.80 kg/m    Physical Exam Vitals reviewed.  Constitutional:      General: She is not in acute distress.    Appearance: Normal appearance. She is not  toxic-appearing.  HENT:     Head: Normocephalic and atraumatic.     Right Ear: External ear normal.     Left Ear: External ear normal.     Nose: Nose normal. No congestion or rhinorrhea.     Mouth/Throat:     Mouth: Mucous membranes are moist.     Pharynx: Oropharynx is clear. No oropharyngeal exudate or posterior oropharyngeal erythema.  Eyes:     General: No scleral icterus.    Extraocular Movements: Extraocular movements intact.     Conjunctiva/sclera: Conjunctivae normal.     Pupils: Pupils are equal, round, and reactive to light.  Cardiovascular:     Rate and Rhythm: Normal rate and regular rhythm.     Pulses: Normal pulses.     Heart sounds: Normal heart sounds. No murmur heard.    No friction rub. No gallop.  Pulmonary:     Effort: Pulmonary effort is normal.     Breath sounds: Wheezing and rhonchi present. No rales.  Abdominal:     General: Abdomen is flat. Bowel sounds are normal. There is no distension.     Palpations: Abdomen is soft.     Tenderness: There is no abdominal tenderness.  Musculoskeletal:        General: No swelling. Normal range of motion.     Cervical back: Normal range of motion.     Right lower leg: No edema.  Left lower leg: No edema.  Lymphadenopathy:     Cervical: No cervical adenopathy.  Skin:    General: Skin is warm and dry.     Capillary Refill: Capillary refill takes less than 2 seconds.     Coloration: Skin is not jaundiced.  Neurological:     General: No focal deficit present.     Mental Status: She is alert and oriented to person, place, and time.  Psychiatric:        Mood and Affect: Mood normal.        Behavior: Behavior normal.       Assessment & Plan:   Problem List Items Addressed This Visit       Chronic bronchitis ([place]) - Primary    Returning to care today for persistent symptoms of COPD exacerbation.  She continues to endorse a cough productive of green sputum and has needed to use her rescue inhaler once daily for  the past week.  She has previously been treated with azithromycin and a Medrol Dosepak, which did not alleviate her symptoms. -Start Levaquin 750 mg daily x 7 days -Medrol Dosepak prescribed again today -Chest x-ray and sputum culture have been ordered -Continue daily use of Symbicort and as needed albuterol -She was instructed to return to care if her symptoms worsen or fail to improve        Meds ordered this encounter  Medications   benzonatate (TESSALON) 100 MG capsule    Sig: Take 1 capsule (100 mg total) by mouth 2 (two) times daily as needed for cough.    Dispense:  30 capsule    Refill:  0   nystatin (MYCOSTATIN) 100000 UNIT/ML suspension    Sig: TAKE 5 ML BY MOUTH  4 TIMES DAILY AS NEEDED    Dispense:  500 mL    Refill:  0   levofloxacin (LEVAQUIN) 750 MG tablet    Sig: Take 1 tablet (750 mg total) by mouth daily for 7 days.    Dispense:  7 tablet    Refill:  0   methylPREDNISolone (MEDROL DOSEPAK) 4 MG TBPK tablet    Sig: Take as package instructions.    Dispense:  1 each    Refill:  0    Return if symptoms worsen or fail to improve.  Johnette Abraham, MD

## 2022-07-23 ENCOUNTER — Telehealth: Payer: Self-pay | Admitting: Internal Medicine

## 2022-07-23 ENCOUNTER — Ambulatory Visit (HOSPITAL_COMMUNITY)
Admission: RE | Admit: 2022-07-23 | Discharge: 2022-07-23 | Disposition: A | Discharge status: Home / Self Care | Payer: 59 | Source: Ambulatory Visit | Attending: Internal Medicine | Admitting: Internal Medicine

## 2022-07-23 DIAGNOSIS — J441 Chronic obstructive pulmonary disease with (acute) exacerbation: Secondary | ICD-10-CM | POA: Diagnosis not present

## 2022-07-23 DIAGNOSIS — J411 Mucopurulent chronic bronchitis: Secondary | ICD-10-CM | POA: Insufficient documentation

## 2022-07-23 DIAGNOSIS — J439 Emphysema, unspecified: Secondary | ICD-10-CM | POA: Diagnosis not present

## 2022-07-23 DIAGNOSIS — R0602 Shortness of breath: Secondary | ICD-10-CM | POA: Diagnosis not present

## 2022-07-23 LAB — CMP14+EGFR
ALT: 48 IU/L — ABNORMAL HIGH (ref 0–32)
AST: 75 IU/L — ABNORMAL HIGH (ref 0–40)
Albumin/Globulin Ratio: 2 (ref 1.2–2.2)
Albumin: 4.8 g/dL (ref 3.9–4.9)
Alkaline Phosphatase: 148 IU/L — ABNORMAL HIGH (ref 44–121)
BUN/Creatinine Ratio: 10 — ABNORMAL LOW (ref 12–28)
BUN: 5 mg/dL — ABNORMAL LOW (ref 8–27)
Bilirubin Total: 0.4 mg/dL (ref 0.0–1.2)
CO2: 21 mmol/L (ref 20–29)
Calcium: 9.4 mg/dL (ref 8.7–10.3)
Chloride: 97 mmol/L (ref 96–106)
Creatinine, Ser: 0.48 mg/dL — ABNORMAL LOW (ref 0.57–1.00)
Globulin, Total: 2.4 g/dL (ref 1.5–4.5)
Glucose: 77 mg/dL (ref 70–99)
Potassium: 4.2 mmol/L (ref 3.5–5.2)
Sodium: 140 mmol/L (ref 134–144)
Total Protein: 7.2 g/dL (ref 6.0–8.5)
eGFR: 106 mL/min/{1.73_m2} (ref >59)

## 2022-07-23 LAB — CBC WITH DIFFERENTIAL/PLATELET
Basophils Absolute: 0.1 10*3/uL (ref 0.0–0.2)
Basos: 2 %
EOS (ABSOLUTE): 0 10*3/uL (ref 0.0–0.4)
Eos: 1 %
Hematocrit: 42.3 % (ref 34.0–46.6)
Hemoglobin: 15.3 g/dL (ref 11.1–15.9)
Immature Grans (Abs): 0 10*3/uL (ref 0.0–0.1)
Immature Granulocytes: 0 %
Lymphocytes Absolute: 1.8 10*3/uL (ref 0.7–3.1)
Lymphs: 56 %
MCH: 36 pg — ABNORMAL HIGH (ref 26.6–33.0)
MCHC: 36.2 g/dL — ABNORMAL HIGH (ref 31.5–35.7)
MCV: 100 fL — ABNORMAL HIGH (ref 79–97)
Monocytes Absolute: 0.4 10*3/uL (ref 0.1–0.9)
Monocytes: 12 %
Neutrophils Absolute: 0.9 10*3/uL — ABNORMAL LOW (ref 1.4–7.0)
Neutrophils: 29 %
Platelets: 188 10*3/uL (ref 150–450)
RBC: 4.25 x10E6/uL (ref 3.77–5.28)
RDW: 12.3 % (ref 11.7–15.4)
WBC: 3.3 10*3/uL — ABNORMAL LOW (ref 3.4–10.8)

## 2022-07-23 LAB — TSH+FREE T4
Free T4: 1.22 ng/dL (ref 0.82–1.77)
TSH: 2.36 u[IU]/mL (ref 0.450–4.500)

## 2022-07-23 NOTE — Telephone Encounter (Signed)
Patient dropped off supne in lab at 3:17 pm on 03.19.2024

## 2022-07-23 NOTE — Telephone Encounter (Signed)
Spoke with patient.

## 2022-07-23 NOTE — Telephone Encounter (Signed)
Pt returned call

## 2022-07-25 LAB — GRAM STAIN W/SPUTUM CULT RFLX

## 2022-08-12 DIAGNOSIS — M549 Dorsalgia, unspecified: Secondary | ICD-10-CM | POA: Diagnosis not present

## 2022-08-12 DIAGNOSIS — Z79891 Long term (current) use of opiate analgesic: Secondary | ICD-10-CM | POA: Diagnosis not present

## 2022-08-12 DIAGNOSIS — G894 Chronic pain syndrome: Secondary | ICD-10-CM | POA: Diagnosis not present

## 2022-08-12 DIAGNOSIS — M4326 Fusion of spine, lumbar region: Secondary | ICD-10-CM | POA: Diagnosis not present

## 2022-08-12 DIAGNOSIS — Z79899 Other long term (current) drug therapy: Secondary | ICD-10-CM | POA: Diagnosis not present

## 2022-08-12 DIAGNOSIS — M5442 Lumbago with sciatica, left side: Secondary | ICD-10-CM | POA: Diagnosis not present

## 2022-09-09 DIAGNOSIS — G894 Chronic pain syndrome: Secondary | ICD-10-CM | POA: Diagnosis not present

## 2022-09-09 DIAGNOSIS — M4326 Fusion of spine, lumbar region: Secondary | ICD-10-CM | POA: Diagnosis not present

## 2022-09-09 DIAGNOSIS — M5442 Lumbago with sciatica, left side: Secondary | ICD-10-CM | POA: Diagnosis not present

## 2022-09-09 DIAGNOSIS — M549 Dorsalgia, unspecified: Secondary | ICD-10-CM | POA: Diagnosis not present

## 2022-09-09 DIAGNOSIS — Z79899 Other long term (current) drug therapy: Secondary | ICD-10-CM | POA: Diagnosis not present

## 2022-10-07 DIAGNOSIS — M5442 Lumbago with sciatica, left side: Secondary | ICD-10-CM | POA: Diagnosis not present

## 2022-10-07 DIAGNOSIS — Z79891 Long term (current) use of opiate analgesic: Secondary | ICD-10-CM | POA: Diagnosis not present

## 2022-10-07 DIAGNOSIS — Z79899 Other long term (current) drug therapy: Secondary | ICD-10-CM | POA: Diagnosis not present

## 2022-10-07 DIAGNOSIS — M4326 Fusion of spine, lumbar region: Secondary | ICD-10-CM | POA: Diagnosis not present

## 2022-10-07 DIAGNOSIS — M549 Dorsalgia, unspecified: Secondary | ICD-10-CM | POA: Diagnosis not present

## 2022-10-07 DIAGNOSIS — G894 Chronic pain syndrome: Secondary | ICD-10-CM | POA: Diagnosis not present

## 2022-10-24 ENCOUNTER — Other Ambulatory Visit: Payer: Self-pay | Admitting: Internal Medicine

## 2022-10-24 DIAGNOSIS — B370 Candidal stomatitis: Secondary | ICD-10-CM

## 2022-11-05 DIAGNOSIS — M4326 Fusion of spine, lumbar region: Secondary | ICD-10-CM | POA: Diagnosis not present

## 2022-11-05 DIAGNOSIS — M5442 Lumbago with sciatica, left side: Secondary | ICD-10-CM | POA: Diagnosis not present

## 2022-11-05 DIAGNOSIS — M549 Dorsalgia, unspecified: Secondary | ICD-10-CM | POA: Diagnosis not present

## 2022-11-05 DIAGNOSIS — G894 Chronic pain syndrome: Secondary | ICD-10-CM | POA: Diagnosis not present

## 2022-11-05 DIAGNOSIS — Z79891 Long term (current) use of opiate analgesic: Secondary | ICD-10-CM | POA: Diagnosis not present

## 2022-11-05 DIAGNOSIS — Z79899 Other long term (current) drug therapy: Secondary | ICD-10-CM | POA: Diagnosis not present

## 2022-11-14 ENCOUNTER — Other Ambulatory Visit: Payer: Self-pay | Admitting: Internal Medicine

## 2022-11-14 DIAGNOSIS — J302 Other seasonal allergic rhinitis: Secondary | ICD-10-CM

## 2022-11-25 ENCOUNTER — Other Ambulatory Visit: Payer: Self-pay | Admitting: Internal Medicine

## 2022-11-25 DIAGNOSIS — J302 Other seasonal allergic rhinitis: Secondary | ICD-10-CM

## 2022-11-27 ENCOUNTER — Telehealth: Payer: Self-pay | Admitting: Internal Medicine

## 2022-11-27 ENCOUNTER — Other Ambulatory Visit: Payer: Self-pay | Admitting: Internal Medicine

## 2022-11-27 DIAGNOSIS — J410 Simple chronic bronchitis: Secondary | ICD-10-CM

## 2022-11-27 DIAGNOSIS — J302 Other seasonal allergic rhinitis: Secondary | ICD-10-CM

## 2022-11-27 MED ORDER — BENZONATATE 100 MG PO CAPS: 100.0000 mg | ORAL_CAPSULE | Freq: Two times a day (BID) | ORAL | 0 refills | Status: DC | PRN

## 2022-11-27 NOTE — Telephone Encounter (Signed)
Patient advised.

## 2022-11-27 NOTE — Telephone Encounter (Signed)
Patient called asking to switch provider from Dr Allena Katz to Dr Durwin Nora.  Reason: Seen Dr Durwin Nora on her last visit and really liked Dr Durwin Nora, There is nothing against Dr Allena Katz.  Call patient if okay to switch to Dr Durwin Nora.

## 2022-11-27 NOTE — Telephone Encounter (Signed)
Prescription Request  11/27/2022  LOV: 07/01/2022  What is the name of the medication or equipment? benzonatate (TESSALON) 100 MG capsule [[id number]   Have you contacted your pharmacy to request a refill? No   Which pharmacy would you like this sent to? [facility]   Patient notified that their request is being sent to the clinical staff for review and that they should receive a response within 2 business days.   Please advise at Mobile [phone] (mobile)

## 2022-11-28 NOTE — Telephone Encounter (Signed)
Okay to switch to preferred PCP

## 2022-11-28 NOTE — Telephone Encounter (Signed)
Scheduled appt with Dr Durwin Nora

## 2022-12-03 DIAGNOSIS — Z79899 Other long term (current) drug therapy: Secondary | ICD-10-CM | POA: Diagnosis not present

## 2022-12-03 DIAGNOSIS — M5442 Lumbago with sciatica, left side: Secondary | ICD-10-CM | POA: Diagnosis not present

## 2022-12-03 DIAGNOSIS — M549 Dorsalgia, unspecified: Secondary | ICD-10-CM | POA: Diagnosis not present

## 2022-12-03 DIAGNOSIS — M4326 Fusion of spine, lumbar region: Secondary | ICD-10-CM | POA: Diagnosis not present

## 2022-12-03 DIAGNOSIS — G894 Chronic pain syndrome: Secondary | ICD-10-CM | POA: Diagnosis not present

## 2022-12-03 DIAGNOSIS — Z79891 Long term (current) use of opiate analgesic: Secondary | ICD-10-CM | POA: Diagnosis not present

## 2022-12-30 ENCOUNTER — Encounter: Payer: 59 | Admitting: Internal Medicine

## 2022-12-31 DIAGNOSIS — M4326 Fusion of spine, lumbar region: Secondary | ICD-10-CM | POA: Diagnosis not present

## 2022-12-31 DIAGNOSIS — Z79899 Other long term (current) drug therapy: Secondary | ICD-10-CM | POA: Diagnosis not present

## 2022-12-31 DIAGNOSIS — M549 Dorsalgia, unspecified: Secondary | ICD-10-CM | POA: Diagnosis not present

## 2022-12-31 DIAGNOSIS — M5442 Lumbago with sciatica, left side: Secondary | ICD-10-CM | POA: Diagnosis not present

## 2022-12-31 DIAGNOSIS — Z79891 Long term (current) use of opiate analgesic: Secondary | ICD-10-CM | POA: Diagnosis not present

## 2022-12-31 DIAGNOSIS — G894 Chronic pain syndrome: Secondary | ICD-10-CM | POA: Diagnosis not present

## 2023-01-01 ENCOUNTER — Other Ambulatory Visit: Payer: Self-pay | Admitting: Internal Medicine

## 2023-01-01 DIAGNOSIS — J302 Other seasonal allergic rhinitis: Secondary | ICD-10-CM

## 2023-01-01 DIAGNOSIS — J410 Simple chronic bronchitis: Secondary | ICD-10-CM

## 2023-01-29 ENCOUNTER — Ambulatory Visit: Payer: 59 | Admitting: Family Medicine

## 2023-01-29 ENCOUNTER — Ambulatory Visit (HOSPITAL_COMMUNITY)
Admission: RE | Admit: 2023-01-29 | Discharge: 2023-01-29 | Disposition: A | Discharge status: Home / Self Care | Payer: 59 | Source: Ambulatory Visit | Attending: Family Medicine | Admitting: Family Medicine

## 2023-01-29 VITALS — BP 135/78 | HR 80 | Ht 61.0 in | Wt 104.8 lb

## 2023-01-29 DIAGNOSIS — M5442 Lumbago with sciatica, left side: Secondary | ICD-10-CM

## 2023-01-29 DIAGNOSIS — G8929 Other chronic pain: Secondary | ICD-10-CM

## 2023-01-29 DIAGNOSIS — M545 Low back pain, unspecified: Secondary | ICD-10-CM

## 2023-01-29 DIAGNOSIS — M47817 Spondylosis without myelopathy or radiculopathy, lumbosacral region: Secondary | ICD-10-CM | POA: Diagnosis not present

## 2023-01-29 DIAGNOSIS — M48061 Spinal stenosis, lumbar region without neurogenic claudication: Secondary | ICD-10-CM | POA: Diagnosis not present

## 2023-01-29 DIAGNOSIS — M47816 Spondylosis without myelopathy or radiculopathy, lumbar region: Secondary | ICD-10-CM | POA: Diagnosis not present

## 2023-01-29 MED ORDER — METHYLPREDNISOLONE ACETATE 80 MG/ML IJ SUSP
40.0000 mg | Freq: Once | INTRAMUSCULAR | Status: AC
  Administered 2023-01-29: 40 mg via INTRAMUSCULAR

## 2023-01-29 MED ORDER — TIZANIDINE HCL 6 MG PO CAPS: 6.0000 mg | ORAL_CAPSULE | Freq: Two times a day (BID) | ORAL | 2 refills | Status: DC | PRN

## 2023-01-29 NOTE — Progress Notes (Signed)
Patient Office Visit   Subjective   Patient ID: [name] [name], female    DOB: [birth year]  Age: 67 y.o. MRN: [id number]  CC:  Chief Complaint  Patient presents with   Back Pain    Began around Labor Day    HPI [name] [name] 67 year old female, presents to the clinic for worsening lumbar pain. 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  Back Pain This is a recurrent problem. The problem occurs constantly. The problem has been gradually worsening since onset. The pain is present in the lumbar spine and sacro-iliac. The quality of the pain is described as aching. The pain radiates to the left thigh. The pain is at a severity of 10/10. The pain is The same all the time. The symptoms are aggravated by position, standing, bending and twisting. Stiffness is present All day. Associated symptoms include leg pain, numbness, tingling and weakness. Pertinent negatives include no bladder incontinence, bowel incontinence or pelvic pain. Risk factors include poor posture and history of osteoporosis. She has tried analgesics and muscle relaxant for the symptoms. The treatment provided moderate relief.      Outpatient Encounter Medications as of 01/29/2023  Medication Sig   amLODipine (NORVASC) 10 MG tablet Take 1 tablet (10 mg total) by mouth daily.   benzonatate (TESSALON) 100 MG capsule  TAKE 1 CAPSULE BY MOUTH TWICE DAILY AS NEEDED FOR COUGH   buprenorphine (BUTRANS) 10 MCG/HR PTWK Place 1 patch onto the skin once a week.   calcium carbonate (TUMS - DOSED IN MG ELEMENTAL CALCIUM) 500 MG chewable tablet Chew 1 tablet (200 mg of elemental calcium total) by mouth 3 (three) times daily with meals.   clotrimazole (CLOTRIMAZOLE AF) 1 % cream Apply 1 application topically 2 (two) times daily. To affected area for 1-2 weeks   dexlansoprazole (DEXILANT) 60 MG capsule 1 PO EVERY MORNING WITH BREAKFAST.   fluticasone (FLONASE) 50 MCG/ACT nasal spray USE TWO SPRAY(S) IN EACH NOSTRIL ONCE DAILY AS NEEDED FOR ALLERGY OR RHINITIS   gabapentin (NEURONTIN) 400 MG capsule Take 1 capsule by mouth 4 times daily   lactase (LACTAID) 3000 units tablet Take 3,000 Units by mouth daily as needed (lactose intolerance).   Lidocaine HCl 4 % LIQD Apply 1 application topically 3 (three) times daily as needed (pain).   loratadine (CLARITIN) 10 MG tablet Take 1 tablet (10 mg total) by mouth daily.   LORazepam (ATIVAN) 0.5 MG tablet Take 0.5 mg by mouth at bedtime as needed.   magnesium hydroxide (MILK OF MAGNESIA) 400 MG/5ML suspension Take 30 mLs by mouth daily as needed for mild constipation.   Menthol 10 % AERO Apply 1 application topically 3 (three) times daily as needed (pain).   nystatin (MYCOSTATIN) 100000 UNIT/ML suspension TAKE 5 MLS BY MOUTH 4 TIMES DAILY AS NEEDED.   SYMBICORT 80-4.5 MCG/ACT inhaler Inhale 2 puffs by mouth twice daily   tizanidine (ZANAFLEX) 6 MG capsule Take 1 capsule (6 mg total) by mouth 2 (two) times  daily as needed for muscle spasms.   triamcinolone (KENALOG) 0.1 % Apply 1 application topically 2 (two) times daily.   UNABLE TO FIND Capsule to rebuild cells-BID   UNABLE TO FIND as needed. Penetrex cream   VENTOLIN HFA 108 (90 Base) MCG/ACT inhaler INHALE 2 PUFFS BY MOUTH EVERY 6 HOURS AS NEEDED FOR WHEEZING OR SHORTNESS OF BREATH   [DISCONTINUED] valACYclovir (VALTREX) 1000 MG  tablet Take 1 tablet (1,000 mg total) by mouth 2 (two) times daily.   [DISCONTINUED] cyclobenzaprine (FLEXERIL) 10 MG tablet Take 10 mg by mouth as needed for muscle spasms.  (Patient not taking: Reported on 01/29/2023)   [DISCONTINUED] methylPREDNISolone (MEDROL DOSEPAK) 4 MG TBPK tablet Take as package instructions.   [DISCONTINUED] oxyCODONE-acetaminophen (PERCOCET) 10-325 MG tablet Take 1 tablet by mouth 3 (three) times daily as needed. (Patient not taking: Reported on 01/29/2023)   Facility-Administered Encounter Medications as of 01/29/2023  Medication   methylPREDNISolone acetate (DEPO-MEDROL) injection 40 mg    Past Surgical History:  Procedure Laterality Date   BACK SURGERY     BACK SURGERY  07/16/2018   BILATERAL SALPINGOOPHORECTOMY     BIOPSY  12/24/2011   Procedure: BIOPSY;  Surgeon: West Bali, MD;  Location: [facility];  Service: Endoscopy;;  Gastric Biopsies   BIOPSY N/A 06/30/2012   Procedure: BIOPSY;  Surgeon: West Bali, MD;  Location: [facility];  Service: Endoscopy;  Laterality: N/A;  Gastric and Esophageal Biopsies   CARPAL TUNNEL RELEASE  LEFT   CATARACT EXTRACTION W/PHACO Right 07/30/2016   Procedure: CATARACT EXTRACTION PHACO AND INTRAOCULAR LENS PLACEMENT (IOC);  Surgeon: Jethro Bolus, MD;  Location: [facility];  Service: Ophthalmology;  Laterality: Right;  CDE: 5.45   CATARACT EXTRACTION W/PHACO Left 08/13/2016   Procedure: CATARACT EXTRACTION PHACO AND INTRAOCULAR LENS PLACEMENT (IOC);  Surgeon: Jethro Bolus, MD;  Location: [facility];  Service: Ophthalmology;  Laterality: Left;  CDE: 5.59   COLON SURGERY     COLONOSCOPY  JAN 2009 DIARRHEA, ABD PAIN, BRBPR   ANASTOMOTIC ULCER(3MM), IH FA:OZHYQM ULCER, NL COLON bX   COLONOSCOPY WITH PROPOFOL N/A 04/21/2018   examined portion of the ileum normal, two 3-5 mm polyps, diverticulosis in the rectosigmoid and sigmoid colon, external and internal hemorrhoids, significant looping of the colon.  Pathology with tubular adenomas.   Recommendations to follow high-fiber diet and repeat colonoscopy in 5 to 10 years with peds colonoscope.   DILATION AND CURETTAGE OF UTERUS     ESOPHAGEAL DILATION  12/24/2011   SLF: A stricture was found in the distal esophagus/ Moderate gastritis/ MILD Duodenitis   ESOPHAGOGASTRODUODENOSCOPY  02/07/09   mild gastritis   ESOPHAGOGASTRODUODENOSCOPY (EGD) WITH PROPOFOL N/A 06/30/2012   SLF: 1. No definite stricture appreciated 2. small hiatal hernia 3. Gastritis   ESOPHAGOGASTRODUODENOSCOPY (EGD) WITH PROPOFOL N/A 02/20/2016   Procedure: ESOPHAGOGASTRODUODENOSCOPY (EGD) WITH PROPOFOL;  Surgeon: West Bali, MD;  [facility]: [facility];  Service: Endoscopy;  Laterality: N/A;  930   ESOPHAGOGASTRODUODENOSCOPY (EGD) WITH PROPOFOL N/A 11/24/2018   benign-appearing esophageal peptic stricture s/p dilation, small hiatal hernia, moderate erosive gastritis.    EYE SURGERY     gum flap Bilateral    HEMICOLECTOMY  RIGHT 2006   HERNIA REPAIR     LUMBAR DISC SURGERY     POLYPECTOMY  04/21/2018   Procedure: POLYPECTOMY;  Surgeon: West Bali, MD;  Location: [facility];  Service: Endoscopy;;  (colon)   SAVORY DILATION N/A 06/30/2012   Procedure: SAVORY DILATION;  Surgeon: Duncan Dull  Loreen Freud, MD;  Location: [facility];  Service: Endoscopy;  Laterality: N/A;  12.69mm , 14mm, 15mm   SAVORY DILATION N/A 02/20/2016   Procedure: SAVORY DILATION;  Surgeon: West Bali, MD;  Location: [facility];  Service: Endoscopy;  Laterality: N/A;   SAVORY DILATION N/A 11/24/2018   Procedure: SAVORY DILATION;  Surgeon: West Bali, MD;  Location: [facility];  Service: Endoscopy;  Laterality: N/A;   UPPER GASTROINTESTINAL ENDOSCOPY  JAN 2009 ABD PAIN   GASTRITIS, ESO RING   UPPER GASTROINTESTINAL ENDOSCOPY  OCT 2010 ABD PAIN, DYSPHAGIA   DIL , GASTRITIS, NL DUODENUM   UPPER GASTROINTESTINAL ENDOSCOPY  OCT 2010 DYSPHAGIA   DIL 17 MM, GASTRITIS, NL DUODENUM   vocal cord surgery  Sep 20, 2010   precancerous  areas removed   wisdom tooth extraction Right    pin placement due to broken jaw    Review of Systems  Gastrointestinal:  Negative for bowel incontinence.  Genitourinary:  Negative for bladder incontinence and pelvic pain.  Musculoskeletal:  Positive for back pain.  Neurological:  Positive for tingling, weakness and numbness.      Objective    BP 135/78   Pulse 80   Ht 5\' 1"  (1.549 m)   Wt 104 lb 12 oz (47.5 kg)   SpO2 91%   BMI 19.79 kg/m   Physical Exam Vitals reviewed.  Constitutional:      General: She is not in acute distress.    Appearance: Normal appearance. She is not ill-appearing, toxic-appearing or diaphoretic.  HENT:     Head: Normocephalic.  Eyes:     General:        Right eye: No discharge.        Left eye: No discharge.     Conjunctiva/sclera: Conjunctivae normal.  Cardiovascular:     Rate and Rhythm: Normal rate.     Pulses: Normal pulses.     Heart sounds: Normal heart sounds.  Pulmonary:     Effort: Pulmonary effort is normal. No respiratory distress.     Breath sounds: Normal breath sounds.  Abdominal:     General: Bowel sounds are normal.     Palpations: Abdomen is soft.  Musculoskeletal:     Cervical back: Normal range of motion.     Lumbar back: Spasms and tenderness present. No swelling, edema or signs of trauma. Decreased range of motion.  Skin:    General: Skin is warm and dry.     Capillary Refill: Capillary refill takes less than 2 seconds.  Neurological:     Mental Status: She is alert.  Psychiatric:        Mood and Affect: Mood normal.       Assessment & Plan:  Lumbar pain -     DG Lumbar Spine 2-3 Views; Future -     tiZANidine HCl; Take 1 capsule (6 mg total) by mouth 2 (two) times daily as needed for muscle spasms.  Dispense: 30 capsule; Refill: 2 -     methylPREDNISolone Acetate  Chronic left-sided low back pain with left-sided sciatica Assessment & Plan: Acute on Chronic pain Lumbar xray ordered- awaiting results will  follow up Steroid IM Injection given today Start Zanaflex 6 mg PRN Explained to patient Non pharmacological interventions include the use of ice or heat, rest, recommend range of motion exercises, gentle stretching. Follow up for worsening or persistent symptoms. Patient verbalizes understanding regarding plan of care and all questions answered.  Return if symptoms worsen or fail to improve.   Cruzita Lederer Newman Nip, FNP

## 2023-01-29 NOTE — Patient Instructions (Signed)
        Great to see you today.  I have refilled the medication(s) we provide.    - Please take medications as prescribed. - Follow up with your primary health provider if any health concerns arises. - If symptoms worsen please contact your primary care provider and/or visit the emergency department.  

## 2023-01-29 NOTE — Assessment & Plan Note (Signed)
Acute on Chronic pain Lumbar xray ordered- awaiting results will follow up Steroid IM Injection given today Start Zanaflex 6 mg PRN Explained to patient Non pharmacological interventions include the use of ice or heat, rest, recommend range of motion exercises, gentle stretching. Follow up for worsening or persistent symptoms. Patient verbalizes understanding regarding plan of care and all questions answered.

## 2023-02-03 DIAGNOSIS — G894 Chronic pain syndrome: Secondary | ICD-10-CM | POA: Diagnosis not present

## 2023-02-03 DIAGNOSIS — M5442 Lumbago with sciatica, left side: Secondary | ICD-10-CM | POA: Diagnosis not present

## 2023-02-03 DIAGNOSIS — Z79891 Long term (current) use of opiate analgesic: Secondary | ICD-10-CM | POA: Diagnosis not present

## 2023-02-03 DIAGNOSIS — M549 Dorsalgia, unspecified: Secondary | ICD-10-CM | POA: Diagnosis not present

## 2023-02-03 DIAGNOSIS — M4326 Fusion of spine, lumbar region: Secondary | ICD-10-CM | POA: Diagnosis not present

## 2023-02-03 DIAGNOSIS — Z79899 Other long term (current) drug therapy: Secondary | ICD-10-CM | POA: Diagnosis not present

## 2023-02-04 ENCOUNTER — Other Ambulatory Visit (HOSPITAL_COMMUNITY): Payer: Self-pay | Admitting: Student

## 2023-02-04 DIAGNOSIS — M25552 Pain in left hip: Secondary | ICD-10-CM | POA: Diagnosis not present

## 2023-02-04 DIAGNOSIS — M5442 Lumbago with sciatica, left side: Secondary | ICD-10-CM | POA: Diagnosis not present

## 2023-02-04 DIAGNOSIS — G8929 Other chronic pain: Secondary | ICD-10-CM

## 2023-02-04 DIAGNOSIS — R1032 Left lower quadrant pain: Secondary | ICD-10-CM | POA: Diagnosis not present

## 2023-02-08 ENCOUNTER — Ambulatory Visit (HOSPITAL_COMMUNITY)
Admission: RE | Admit: 2023-02-08 | Discharge: 2023-02-08 | Disposition: A | Discharge status: Home / Self Care | Payer: 59 | Source: Ambulatory Visit | Attending: Student | Admitting: Student

## 2023-02-08 DIAGNOSIS — M5442 Lumbago with sciatica, left side: Secondary | ICD-10-CM | POA: Diagnosis not present

## 2023-02-08 DIAGNOSIS — M5116 Intervertebral disc disorders with radiculopathy, lumbar region: Secondary | ICD-10-CM | POA: Diagnosis not present

## 2023-02-08 DIAGNOSIS — G8929 Other chronic pain: Secondary | ICD-10-CM | POA: Diagnosis not present

## 2023-02-08 DIAGNOSIS — M48061 Spinal stenosis, lumbar region without neurogenic claudication: Secondary | ICD-10-CM | POA: Diagnosis not present

## 2023-02-08 DIAGNOSIS — M4726 Other spondylosis with radiculopathy, lumbar region: Secondary | ICD-10-CM | POA: Diagnosis not present

## 2023-02-08 DIAGNOSIS — M4807 Spinal stenosis, lumbosacral region: Secondary | ICD-10-CM | POA: Diagnosis not present

## 2023-02-13 ENCOUNTER — Ambulatory Visit (INDEPENDENT_AMBULATORY_CARE_PROVIDER_SITE_OTHER): Payer: 59 | Admitting: Internal Medicine

## 2023-02-13 ENCOUNTER — Encounter: Payer: Self-pay | Admitting: Internal Medicine

## 2023-02-13 VITALS — BP 149/84 | HR 87 | Ht 61.0 in | Wt 106.2 lb

## 2023-02-13 DIAGNOSIS — Z0001 Encounter for general adult medical examination with abnormal findings: Secondary | ICD-10-CM | POA: Insufficient documentation

## 2023-02-13 DIAGNOSIS — Z131 Encounter for screening for diabetes mellitus: Secondary | ICD-10-CM | POA: Diagnosis not present

## 2023-02-13 DIAGNOSIS — Z23 Encounter for immunization: Secondary | ICD-10-CM | POA: Diagnosis not present

## 2023-02-13 DIAGNOSIS — D751 Secondary polycythemia: Secondary | ICD-10-CM | POA: Diagnosis not present

## 2023-02-13 DIAGNOSIS — K219 Gastro-esophageal reflux disease without esophagitis: Secondary | ICD-10-CM

## 2023-02-13 DIAGNOSIS — M5442 Lumbago with sciatica, left side: Secondary | ICD-10-CM

## 2023-02-13 DIAGNOSIS — E039 Hypothyroidism, unspecified: Secondary | ICD-10-CM | POA: Diagnosis not present

## 2023-02-13 DIAGNOSIS — E782 Mixed hyperlipidemia: Secondary | ICD-10-CM

## 2023-02-13 DIAGNOSIS — I10 Essential (primary) hypertension: Secondary | ICD-10-CM | POA: Diagnosis not present

## 2023-02-13 DIAGNOSIS — J302 Other seasonal allergic rhinitis: Secondary | ICD-10-CM | POA: Diagnosis not present

## 2023-02-13 DIAGNOSIS — M810 Age-related osteoporosis without current pathological fracture: Secondary | ICD-10-CM

## 2023-02-13 DIAGNOSIS — J410 Simple chronic bronchitis: Secondary | ICD-10-CM

## 2023-02-13 DIAGNOSIS — Z1231 Encounter for screening mammogram for malignant neoplasm of breast: Secondary | ICD-10-CM | POA: Insufficient documentation

## 2023-02-13 DIAGNOSIS — G8929 Other chronic pain: Secondary | ICD-10-CM | POA: Diagnosis not present

## 2023-02-13 MED ORDER — BENZONATATE 100 MG PO CAPS
100.0000 mg | ORAL_CAPSULE | Freq: Two times a day (BID) | ORAL | 2 refills | Status: DC
Start: 2023-02-13 — End: 2023-07-08

## 2023-02-13 MED ORDER — LACTASE 3000 UNITS PO TABS: 3000.0000 [IU] | ORAL_TABLET | Freq: Every day | ORAL | 1 refills | Status: DC | PRN

## 2023-02-13 MED ORDER — FLUTICASONE PROPIONATE 50 MCG/ACT NA SUSP: NASAL | 2 refills | Status: DC

## 2023-02-13 NOTE — Assessment & Plan Note (Signed)
Annual exam completed today.  Available records and labs reviewed. -Repeat labs ordered today -Influenza vaccine administered  -Screening mammogram ordered -We will tentatively plan for follow-up in 6 months

## 2023-02-13 NOTE — Assessment & Plan Note (Signed)
Remains adequately controlled with amlodipine 10 mg daily.  No medication changes are indicated today.

## 2023-02-13 NOTE — Assessment & Plan Note (Signed)
Asymptomatic currently.  Pulmonary exam is unremarkable.  She is using Symbicort daily and albuterol as needed, which she typically needs 1 time per week.

## 2023-02-13 NOTE — Patient Instructions (Signed)
It was a pleasure to see you today.  Thank you for giving [place] the opportunity to be involved in your care.  Below is a brief recap of your visit and next steps.  We will plan to see you again in 6 months.  Summary Annual exam completed today Repeat labs ordered Medications refilled Flu shot today Pneumonia vaccine in 2 weeks Follow up in 6 months

## 2023-02-13 NOTE — Assessment & Plan Note (Signed)
Symptoms are adequately controlled with Tums.  She is no longer taking Dexilant.

## 2023-02-13 NOTE — Assessment & Plan Note (Signed)
Screening mammogram ordered today ?

## 2023-02-13 NOTE — Assessment & Plan Note (Signed)
Influenza vaccine administered today.

## 2023-02-13 NOTE — Progress Notes (Signed)
Complete physical exam  Patient: [name] [name]   DOB: [birth year]   67 y.o. Female  MRN: [id number]  Subjective:    Chief Complaint  Patient presents with   Annual Exam    [name] [name] is a 67 y.o. female who presents today for a complete physical exam. She reports consuming a general diet. The patient does not participate in regular exercise at present. She generally feels fairly well. She reports sleeping fairly well. She does not have additional problems to discuss today.    Most recent fall risk assessment:    02/13/2023    9:59 AM  Fall Risk   Falls in the past year? 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   Crohn disease (HCC)    CTS (carpal tunnel syndrome)    Diarrhea MULITIFACTORIAL   IBS, LACTOSE INTOLERANCE, SBBO, BILE-SALT   Elevated liver enzymes 2009: BMI 24 ?Etoh ALT 94, AST 51,  NEG ANA, qIGs& ASMA   MAY 2012 AST 52 ALT 38   Esophageal stricture 2009   GERD (gastroesophageal reflux disease)    Hemorrhoid    Hyperlipemia    Hypertension    Inflammatory bowel disease 2006 CD   SURGICAL REMISSION   LBP (low back pain)    Mental disorder    Migraine    Osteoporosis    PONV (postoperative nausea and vomiting)    Shortness of breath    exertion and humidity   Shoulder pain    right   Sleep apnea    Stop Bang Cock score of 4   Past Surgical History:  Procedure Laterality Date   BACK SURGERY     BACK SURGERY  07/16/2018   BILATERAL SALPINGOOPHORECTOMY     BIOPSY  12/24/2011   Procedure: BIOPSY;  Surgeon: West Bali, MD;  Location: [facility];  Service: Endoscopy;;  Gastric Biopsies   BIOPSY N/A 06/30/2012   Procedure: BIOPSY;  Surgeon: West Bali, MD;  Location: [facility];  Service: Endoscopy;  Laterality: N/A;  Gastric and Esophageal Biopsies   CARPAL TUNNEL RELEASE  LEFT   CATARACT EXTRACTION W/PHACO Right 07/30/2016   Procedure: CATARACT EXTRACTION PHACO AND INTRAOCULAR LENS PLACEMENT (IOC);  Surgeon: Jethro Bolus, MD;  Location: [facility];  Service: Ophthalmology;  Laterality: Right;  CDE: 5.45   CATARACT EXTRACTION W/PHACO Left 08/13/2016   Procedure: CATARACT EXTRACTION PHACO AND INTRAOCULAR LENS PLACEMENT (IOC);  Surgeon: Jethro Bolus, MD;  Location: [facility];  Service: Ophthalmology;  Laterality: Left;  CDE: 5.59   COLON SURGERY     COLONOSCOPY  JAN 2009 DIARRHEA, ABD PAIN, BRBPR   ANASTOMOTIC ULCER(3MM), IH VQ:QVZDGL ULCER, NL COLON bX   COLONOSCOPY WITH PROPOFOL N/A 04/21/2018   examined portion of the ileum normal, two 3-5 mm polyps, diverticulosis in the rectosigmoid and sigmoid colon, external and internal hemorrhoids, significant looping of the colon.  Pathology with tubular adenomas.  Recommendations to follow high-fiber diet and repeat colonoscopy in 5 to 10 years with peds colonoscope.   DILATION AND CURETTAGE OF UTERUS     ESOPHAGEAL DILATION  12/24/2011   SLF: A stricture was found in the distal esophagus/ Moderate gastritis/ MILD Duodenitis   ESOPHAGOGASTRODUODENOSCOPY  02/07/09   mild gastritis   ESOPHAGOGASTRODUODENOSCOPY (EGD) WITH PROPOFOL N/A 06/30/2012   SLF: 1. No definite stricture appreciated 2. small hiatal hernia 3. Gastritis   ESOPHAGOGASTRODUODENOSCOPY (EGD) WITH PROPOFOL N/A 02/20/2016   Procedure: ESOPHAGOGASTRODUODENOSCOPY (EGD) WITH PROPOFOL;  Surgeon: West Bali, MD;  [facility];  Service: Endoscopy;  Laterality: N/A;  930   ESOPHAGOGASTRODUODENOSCOPY (EGD) WITH PROPOFOL N/A 11/24/2018   benign-appearing esophageal peptic stricture s/p dilation, small hiatal  hernia, moderate erosive gastritis.    EYE SURGERY     gum flap Bilateral    HEMICOLECTOMY  RIGHT 2006   HERNIA REPAIR     LUMBAR DISC SURGERY     POLYPECTOMY  04/21/2018   Procedure: POLYPECTOMY;  Surgeon: West Bali, MD;  [facility];  Service: Endoscopy;;  (colon)   SAVORY DILATION N/A 06/30/2012   Procedure: SAVORY DILATION;  Surgeon: West Bali, MD;  Location: [facility];  Service: Endoscopy;  Laterality: N/A;  12.57mm , 14mm, 15mm   SAVORY DILATION N/A 02/20/2016   Procedure: SAVORY DILATION;  Surgeon: West Bali, MD;  [facility];  Service: Endoscopy;  Laterality: N/A;   SAVORY DILATION N/A 11/24/2018   Procedure: SAVORY DILATION;  Surgeon: West Bali, MD;  [facility];  Service: Endoscopy;  Laterality: N/A;   UPPER GASTROINTESTINAL ENDOSCOPY  JAN 2009 ABD PAIN   GASTRITIS, ESO RING   UPPER GASTROINTESTINAL ENDOSCOPY  OCT 2010 ABD PAIN, DYSPHAGIA   DIL , GASTRITIS, NL DUODENUM   UPPER GASTROINTESTINAL ENDOSCOPY  OCT 2010 DYSPHAGIA   DIL 17 MM, GASTRITIS, NL DUODENUM   vocal cord surgery  Sep 20, 2010   precancerous areas removed   wisdom tooth extraction Right    pin placement due to broken jaw   Social History   Tobacco Use   Smoking status: Every Day    Current packs/day: 0.25    Average packs/day: 0.3 packs/day for 30.0 years (7.5 ttl pk-yrs)    Types: Cigarettes   Smokeless tobacco: Never  Vaping Use   Vaping status: Never Used  Substance Use Topics   Alcohol use: Not Currently    Comment: wine/beer a few times a week    Drug use: No   Family History  Problem Relation Age of Onset   Hypothyroidism Mother    Heart disease Father    Parkinson's disease Father    Hypothyroidism Daughter    Heart attack Paternal Grandfather    Alzheimer's disease Paternal Grandmother    Heart attack Maternal Grandfather    Crohn's disease Sister    Other Sister        was murdered   Crohn's disease Maternal Uncle    Colon  cancer Neg Hx    Colon polyps Neg Hx    Allergies  Allergen Reactions   Naproxen Shortness Of Breath    Chest pain   Dicyclomine Hcl Other (See Comments)    Vision changes   Duloxetine Other (See Comments)    Suicidal    Egg-Derived Products Diarrhea    abd pain   Lovastatin Other (See Comments)    Chest pain   Toradol [Ketorolac Tromethamine] Other (See Comments)  Headache. Non-effective   Tramadol Other (See Comments)    Headache. Non-effective.   Nexium [Esomeprazole Magnesium] Diarrhea    Abdominal pain   Patient Care Team: Billie Lade, MD as PCP - General (Internal Medicine) West Bali, MD (Inactive) (Gastroenterology) Lanelle Bal, DO as Consulting Physician (Internal Medicine)   Outpatient Medications Prior to Visit  Medication Sig   amLODipine (NORVASC) 10 MG tablet Take 1 tablet (10 mg total) by mouth daily.   buprenorphine (BUTRANS) 10 MCG/HR PTWK Place 1 patch onto the skin once a week.   calcium carbonate (TUMS - DOSED IN MG ELEMENTAL CALCIUM) 500 MG chewable tablet Chew 1 tablet (200 mg of elemental calcium total) by mouth 3 (three) times daily with meals.   clotrimazole (CLOTRIMAZOLE AF) 1 % cream Apply 1 application topically 2 (two) times daily. To affected area for 1-2 weeks   gabapentin (NEURONTIN) 400 MG capsule Take 1 capsule by mouth 4 times daily   Lidocaine HCl 4 % LIQD Apply 1 application topically 3 (three) times daily as needed (pain).   magnesium hydroxide (MILK OF MAGNESIA) 400 MG/5ML suspension Take 30 mLs by mouth daily as needed for mild constipation.   Menthol 10 % AERO Apply 1 application topically 3 (three) times daily as needed (pain).   nystatin (MYCOSTATIN) 100000 UNIT/ML suspension TAKE 5 MLS BY MOUTH 4 TIMES DAILY AS NEEDED.   SYMBICORT 80-4.5 MCG/ACT inhaler Inhale 2 puffs by mouth twice daily   triamcinolone (KENALOG) 0.1 % Apply 1 application topically 2 (two) times daily.   VENTOLIN HFA 108 (90 Base) MCG/ACT inhaler  INHALE 2 PUFFS BY MOUTH EVERY 6 HOURS AS NEEDED FOR WHEEZING OR SHORTNESS OF BREATH   [DISCONTINUED] benzonatate (TESSALON) 100 MG capsule TAKE 1 CAPSULE BY MOUTH TWICE DAILY AS NEEDED FOR COUGH   [DISCONTINUED] fluticasone (FLONASE) 50 MCG/ACT nasal spray USE TWO SPRAY(S) IN EACH NOSTRIL ONCE DAILY AS NEEDED FOR ALLERGY OR RHINITIS   [DISCONTINUED] lactase (LACTAID) 3000 units tablet Take 3,000 Units by mouth daily as needed (lactose intolerance).   dexlansoprazole (DEXILANT) 60 MG capsule 1 PO EVERY MORNING WITH BREAKFAST. (Patient not taking: Reported on 02/13/2023)   loratadine (CLARITIN) 10 MG tablet Take 1 tablet (10 mg total) by mouth daily. (Patient not taking: Reported on 02/13/2023)   tizanidine (ZANAFLEX) 6 MG capsule Take 1 capsule (6 mg total) by mouth 2 (two) times daily as needed for muscle spasms. (Patient not taking: Reported on 02/13/2023)   [DISCONTINUED] LORazepam (ATIVAN) 0.5 MG tablet Take 0.5 mg by mouth at bedtime as needed. (Patient not taking: Reported on 02/13/2023)   [DISCONTINUED] UNABLE TO FIND Capsule to rebuild cells-BID (Patient not taking: Reported on 02/13/2023)   [DISCONTINUED] UNABLE TO FIND as needed. Penetrex cream (Patient not taking: Reported on 02/13/2023)   No facility-administered medications prior to visit.   Review of Systems  Musculoskeletal:  Positive for back pain (Chronic left-sided lumbar back pain).  All other systems reviewed and are negative.     Objective:     BP (!) 149/84 (BP Location: Left Arm, Patient Position: Sitting, Cuff Size: Normal)   Pulse 87   Ht 5\' 1"  (1.549 m)   Wt 106 lb 3.2 oz (48.2 kg)   SpO2 97%   BMI 20.07 kg/m  BP Readings from Last 3 Encounters:  02/13/23 (!) 149/84  01/29/23 135/78  07/22/22 126/73   Physical Exam Vitals reviewed.  Constitutional:      General: She is not in acute distress.  Appearance: Normal appearance. She is not toxic-appearing.  HENT:     Head: Normocephalic and atraumatic.      Right Ear: External ear normal.     Left Ear: External ear normal.     Nose: Nose normal. No congestion or rhinorrhea.     Mouth/Throat:     Mouth: Mucous membranes are moist.     Pharynx: Oropharynx is clear. No oropharyngeal exudate or posterior oropharyngeal erythema.  Eyes:     General: No scleral icterus.    Extraocular Movements: Extraocular movements intact.     Conjunctiva/sclera: Conjunctivae normal.     Pupils: Pupils are equal, round, and reactive to light.  Cardiovascular:     Rate and Rhythm: Normal rate and regular rhythm.     Pulses: Normal pulses.     Heart sounds: Normal heart sounds. No murmur heard.    No friction rub. No gallop.  Pulmonary:     Effort: Pulmonary effort is normal.     Breath sounds: Normal breath sounds. No wheezing, rhonchi or rales.  Abdominal:     General: Abdomen is flat. Bowel sounds are normal. There is no distension.     Palpations: Abdomen is soft.     Tenderness: There is no abdominal tenderness.  Musculoskeletal:        General: No swelling. Normal range of motion.     Cervical back: Normal range of motion.     Right lower leg: No edema.     Left lower leg: No edema.  Lymphadenopathy:     Cervical: No cervical adenopathy.  Skin:    General: Skin is warm and dry.     Capillary Refill: Capillary refill takes less than 2 seconds.     Coloration: Skin is not jaundiced.  Neurological:     General: No focal deficit present.     Mental Status: She is alert and oriented to person, place, and time.     Gait: Gait abnormal (Ambulates with cane).  Psychiatric:        Mood and Affect: Mood normal.        Behavior: Behavior normal.   Last CBC Lab Results  Component Value Date   WBC 3.3 (L) 07/22/2022   HGB 15.3 07/22/2022   HCT 42.3 07/22/2022   MCV 100 (H) 07/22/2022   MCH 36.0 (H) 07/22/2022   RDW 12.3 07/22/2022   PLT 188 07/22/2022   Last metabolic panel Lab Results  Component Value Date   GLUCOSE 77 07/22/2022   NA 140  07/22/2022   K 4.2 07/22/2022   CL 97 07/22/2022   CO2 21 07/22/2022   BUN 5 (L) 07/22/2022   CREATININE 0.48 (L) 07/22/2022   EGFR 106 07/22/2022   CALCIUM 9.4 07/22/2022   PHOS 3.5 09/26/2014   PROT 7.2 07/22/2022   ALBUMIN 4.8 07/22/2022   LABGLOB 2.4 07/22/2022   AGRATIO 2.0 07/22/2022   BILITOT 0.4 07/22/2022   ALKPHOS 148 (H) 07/22/2022   AST 75 (H) 07/22/2022   ALT 48 (H) 07/22/2022   ANIONGAP 12 01/31/2020   Last lipids Lab Results  Component Value Date   CHOL 291 (H) 12/27/2021   HDL 183 12/27/2021   LDLCALC 97 12/27/2021   LDLDIRECT 29 03/11/2011   TRIG 73 12/27/2021   CHOLHDL 1.6 12/27/2021   Last hemoglobin A1c Lab Results  Component Value Date   HGBA1C 4.9 12/27/2021   Last thyroid functions Lab Results  Component Value Date   TSH 2.360 07/22/2022   Last  vitamin D Lab Results  Component Value Date   VD25OH 97.2 12/27/2021   Last vitamin B12 and Folate Lab Results  Component Value Date   VITAMINB12 303 02/12/2018      Assessment & Plan:    Routine Health Maintenance and Physical Exam  Immunization History  Administered Date(s) Administered   DT (Pediatric) 02/17/2017   Fluad Trivalent(High Dose 65+) 02/13/2023   H1N1 03/22/2008   Influenza Whole 02/18/2006, 02/03/2007, 02/18/2008, 02/15/2010   Influenza,inj,Quad PF,6+ Mos 04/13/2013, 02/22/2014, 01/09/2016, 02/24/2018, 02/22/2019, 12/27/2021   Influenza-Unspecified 02/21/2017   Pneumococcal Polysaccharide-23 02/18/2006, 08/08/2015   Td 06/12/2009   Tdap 08/08/2015, 02/21/2017   Zoster Recombinant(Shingrix) 02/19/2017, 05/20/2017   Zoster, Live 08/08/2015    Health Maintenance  Topic Date Due   COVID-19 Vaccine (1) Never done   Pneumonia Vaccine 3+ Years old (2 of 2 - PCV) 08/07/2016   Cervical Cancer Screening (HPV/Pap Cotest)  04/02/2020   MAMMOGRAM  07/28/2022   Medicare Annual Wellness (AWV)  01/15/2023   DTaP/Tdap/Td (5 - Td or Tdap) 02/22/2027   Colonoscopy  04/21/2028    INFLUENZA VACCINE  Completed   DEXA SCAN  Completed   Hepatitis C Screening  Completed   HIV Screening  Completed   Zoster Vaccines- Shingrix  Completed   HPV VACCINES  Aged Out    Discussed health benefits of physical activity, and encouraged her to engage in regular exercise appropriate for her age and condition.  Problem List Items Addressed This Visit       Essential hypertension    Remains adequately controlled with amlodipine 10 mg daily.  No medication changes are indicated today.      Chronic bronchitis (HCC)    Asymptomatic currently.  Pulmonary exam is unremarkable.  She is using Symbicort daily and albuterol as needed, which she typically needs 1 time per week.      GERD    Symptoms are adequately controlled with Tums.  She is no longer taking Dexilant.      Osteoporosis    T-score -3.9 on DEXA from 2019.  She was previously receiving Prolia injections, but experienced adverse side effects. -Repeat labs ordered today -Consider additional treatment options for osteoporosis pending results      Chronic low back pain    Followed by [facility].  Recently underwent MRI of the lumbar spine.  Read is still pending.      Breast cancer screening by mammogram - Primary    Screening mammogram ordered today      Encounter for well adult exam with abnormal findings    Annual exam completed today.  Available records and labs reviewed. -Repeat labs ordered today -Influenza vaccine administered  -Screening mammogram ordered -We will tentatively plan for follow-up in 6 months      Need for influenza vaccination    Influenza vaccine administered today      Return in about 6 months (around 08/14/2023).  Billie Lade, MD

## 2023-02-13 NOTE — Assessment & Plan Note (Signed)
T-score -3.9 on DEXA from 2019.  She was previously receiving Prolia injections, but experienced adverse side effects. -Repeat labs ordered today -Consider additional treatment options for osteoporosis pending results

## 2023-02-13 NOTE — Assessment & Plan Note (Signed)
Followed by [facility].  Recently underwent MRI of the lumbar spine.  Read is still pending.

## 2023-02-14 LAB — LIPID PANEL
Chol/HDL Ratio: 1.4 {ratio} (ref 0.0–4.4)
Cholesterol, Total: 300 mg/dL — ABNORMAL HIGH (ref 100–199)
HDL: 209 mg/dL (ref >39)
LDL Chol Calc (NIH): 81 mg/dL (ref 0–99)
Triglycerides: 70 mg/dL (ref 0–149)
VLDL Cholesterol Cal: 10 mg/dL (ref 5–40)

## 2023-02-14 LAB — CMP14+EGFR
ALT: 33 [IU]/L — ABNORMAL HIGH (ref 0–32)
AST: 56 [IU]/L — ABNORMAL HIGH (ref 0–40)
Albumin: 4.5 g/dL (ref 3.9–4.9)
Alkaline Phosphatase: 135 [IU]/L — ABNORMAL HIGH (ref 44–121)
BUN/Creatinine Ratio: 11 — ABNORMAL LOW (ref 12–28)
BUN: 5 mg/dL — ABNORMAL LOW (ref 8–27)
Bilirubin Total: 0.5 mg/dL (ref 0.0–1.2)
CO2: 25 mmol/L (ref 20–29)
Calcium: 9.6 mg/dL (ref 8.7–10.3)
Chloride: 95 mmol/L — ABNORMAL LOW (ref 96–106)
Creatinine, Ser: 0.47 mg/dL — ABNORMAL LOW (ref 0.57–1.00)
Globulin, Total: 2.5 g/dL (ref 1.5–4.5)
Glucose: 95 mg/dL (ref 70–99)
Potassium: 3.9 mmol/L (ref 3.5–5.2)
Sodium: 140 mmol/L (ref 134–144)
Total Protein: 7 g/dL (ref 6.0–8.5)
eGFR: 106 mL/min/{1.73_m2} (ref >59)

## 2023-02-14 LAB — TSH+FREE T4
Free T4: 1.06 ng/dL (ref 0.82–1.77)
TSH: 1.6 u[IU]/mL (ref 0.450–4.500)

## 2023-02-14 LAB — CBC WITH DIFFERENTIAL/PLATELET
Basophils Absolute: 0 10*3/uL (ref 0.0–0.2)
Basos: 1 %
EOS (ABSOLUTE): 0 10*3/uL (ref 0.0–0.4)
Eos: 1 %
Hematocrit: 42.1 % (ref 34.0–46.6)
Hemoglobin: 14.7 g/dL (ref 11.1–15.9)
Immature Grans (Abs): 0 10*3/uL (ref 0.0–0.1)
Immature Granulocytes: 0 %
Lymphocytes Absolute: 0.8 10*3/uL (ref 0.7–3.1)
Lymphs: 18 %
MCH: 36 pg — ABNORMAL HIGH (ref 26.6–33.0)
MCHC: 34.9 g/dL (ref 31.5–35.7)
MCV: 103 fL — ABNORMAL HIGH (ref 79–97)
Monocytes Absolute: 0.6 10*3/uL (ref 0.1–0.9)
Monocytes: 12 %
Neutrophils Absolute: 3 10*3/uL (ref 1.4–7.0)
Neutrophils: 68 %
Platelets: 199 10*3/uL (ref 150–450)
RBC: 4.08 x10E6/uL (ref 3.77–5.28)
RDW: 11.8 % (ref 11.7–15.4)
WBC: 4.5 10*3/uL (ref 3.4–10.8)

## 2023-02-14 LAB — VITAMIN D 25 HYDROXY (VIT D DEFICIENCY, FRACTURES): Vit D, 25-Hydroxy: 75.8 ng/mL (ref 30.0–100.0)

## 2023-02-14 LAB — B12 AND FOLATE PANEL
Folate: 12.9 ng/mL (ref >3.0)
Vitamin B-12: 439 pg/mL (ref 232–1245)

## 2023-02-14 LAB — HEMOGLOBIN A1C
Est. average glucose Bld gHb Est-mCnc: 94 mg/dL
Hgb A1c MFr Bld: 4.9 % (ref 4.8–5.6)

## 2023-02-27 ENCOUNTER — Ambulatory Visit (INDEPENDENT_AMBULATORY_CARE_PROVIDER_SITE_OTHER): Payer: 59

## 2023-02-27 DIAGNOSIS — Z23 Encounter for immunization: Secondary | ICD-10-CM | POA: Diagnosis not present

## 2023-02-27 NOTE — Progress Notes (Signed)
Pt. Received vaccination with out complications.

## 2023-03-12 ENCOUNTER — Telehealth: Payer: Self-pay

## 2023-03-12 NOTE — Patient Outreach (Signed)
[facility] Assistant attempted to call patient on today regarding preventative mammogram screening. No answer from patient after multiple rings. Assistant unable to leave confidential voicemail for patient to return call.  Will call back patient back for final attempt.  Baruch Gouty [facility] Assistant [facility] [phone]

## 2023-03-13 ENCOUNTER — Ambulatory Visit: Payer: Self-pay | Admitting: Internal Medicine

## 2023-03-13 NOTE — Telephone Encounter (Signed)
  Chief Complaint: chest pain Symptoms: chest tightness, SOB, dizziness, vomiting Frequency: comes and goes every hour Disposition: [x] ED /[] Urgent Care (no appt availability in office) / [] Appointment(In office/virtual)/ []  [facility]/ [] Home Care/ [x] Refused Recommended Disposition /[] [facility]/ []  Follow-up with PCP Additional Notes: Patient reports she is having chets pain, SOB, dizziness, and vomiting that has been coming and going for the past week, getting worse and more frequent over the past 2 days. This RN recommended going to the ER per protocol. Patient reports that she "will think about it" because she doesn't like having to wait in the ER. Patient does not want office appointment and states she "really only called for her lab results", which this RN gave per prior reading from Dr. Durwin Nora. This RN advised that if patients condition worsens she should call EMS or the nurse triage line. Patient verbalized understanding.    Good morning [name] [name].  I reviewed your labs from last week's appointment.  Labs are stable overall.  No medication changes are indicated.  Please let me know if you have any questions.  Written by Billie Lade, MD on 02/18/2023  8:18 AM EDT  Copied from CRM [id number]. Topic: Clinical - Red Word Triage >> Mar 13, 2023  1:30 PM Dennison Nancy wrote: Red Word that prompted transfer to Nurse Triage: Tightness in chest , Palpitations  Reason for Disposition  [1] Chest pain (or "angina") comes and goes AND [2] is happening more often (increasing in frequency) or getting worse (increasing in severity)  (Exception: Chest pains that last only a few seconds.)  Answer Assessment - Initial Assessment Questions 1. LOCATION: "Where does it hurt?"       Right in the center of my chest feels tight 2. RADIATION: "Does the pain go anywhere else?" (e.g., into neck, jaw, arms, back)     Stays in that one spot 3. ONSET: "When did the chest pain begin?" (Minutes,  hours or days)      It comes and goes, usually in the morning 4. PATTERN: "Does the pain come and go, or has it been constant since it started?"  "Does it get worse with exertion?"      Comes and goes 5. DURATION: "How long does it last" (e.g., seconds, minutes, hours)     minutes 6. SEVERITY: "How bad is the pain?"  (e.g., Scale 1-10; mild, moderate, or severe)    - MILD (1-3): doesn't interfere with normal activities     - MODERATE (4-7): interferes with normal activities or awakens from sleep    - SEVERE (8-10): excruciating pain, unable to do any normal activities      moderate 7. CARDIAC RISK FACTORS: "Do you have any history of heart problems or risk factors for heart disease?" (e.g., angina, prior heart attack; diabetes, high blood pressure, high cholesterol, smoker, or strong family history of heart disease)     High blood pressure 8. PULMONARY RISK FACTORS: "Do you have any history of lung disease?"  (e.g., blood clots in lung, asthma, emphysema, birth control pills)     Asthma, COPD 9. CAUSE: "What do you think is causing the chest pain?"     I'm not sure maybe with my blood work or my back problems 10. OTHER SYMPTOMS: "Do you have any other symptoms?" (e.g., dizziness, nausea, vomiting, sweating, fever, difficulty breathing, cough)       It takes my breath away, dizziness, vomiting, heart burn, cough  Protocols used: Chest Pain-A-AH

## 2023-03-14 ENCOUNTER — Telehealth: Payer: Self-pay | Admitting: Internal Medicine

## 2023-03-14 ENCOUNTER — Telehealth: Payer: Self-pay

## 2023-03-14 NOTE — Patient Outreach (Signed)
Successful call to patient on today regarding preventative mammogram screening. Patient scheduled for 04/10/23 at 10:45 a.m. at  [facility].   Baruch Gouty [facility] Assistant [facility] [phone]

## 2023-03-14 NOTE — Telephone Encounter (Signed)
Copied from CRM [id number]. Topic: Clinical - Lab/Test Results   >> Mar 13, 2023  1:23 PM Dennison Nancy wrote: .Reason for CRM: patient requesting blood work results. Patient call back # [phone]  .

## 2023-03-17 NOTE — Telephone Encounter (Signed)
Spoke to patient

## 2023-03-18 NOTE — Telephone Encounter (Signed)
Copied from CRM [id number]. Topic: Clinical - Medical Advice >> Mar 17, 2023 12:16 PM Deaijah H wrote: Reason for CRM: heart palpitate std feels heavy would like to speak to speak with provider, hard to swallow & also anxiety medicine doesn't work

## 2023-03-18 NOTE — Telephone Encounter (Signed)
Mailbox full

## 2023-03-26 ENCOUNTER — Encounter: Payer: Self-pay | Admitting: *Deleted

## 2023-03-31 DIAGNOSIS — M549 Dorsalgia, unspecified: Secondary | ICD-10-CM | POA: Diagnosis not present

## 2023-03-31 DIAGNOSIS — M5442 Lumbago with sciatica, left side: Secondary | ICD-10-CM | POA: Diagnosis not present

## 2023-03-31 DIAGNOSIS — M4326 Fusion of spine, lumbar region: Secondary | ICD-10-CM | POA: Diagnosis not present

## 2023-03-31 DIAGNOSIS — G894 Chronic pain syndrome: Secondary | ICD-10-CM | POA: Diagnosis not present

## 2023-03-31 DIAGNOSIS — Z79891 Long term (current) use of opiate analgesic: Secondary | ICD-10-CM | POA: Diagnosis not present

## 2023-04-05 ENCOUNTER — Other Ambulatory Visit: Payer: Self-pay | Admitting: Internal Medicine

## 2023-04-05 DIAGNOSIS — B370 Candidal stomatitis: Secondary | ICD-10-CM

## 2023-04-10 ENCOUNTER — Ambulatory Visit (HOSPITAL_COMMUNITY)
Admission: RE | Admit: 2023-04-10 | Discharge: 2023-04-10 | Disposition: A | Discharge status: Home / Self Care | Payer: 59 | Source: Ambulatory Visit | Attending: Internal Medicine | Admitting: Internal Medicine

## 2023-04-10 DIAGNOSIS — Z1231 Encounter for screening mammogram for malignant neoplasm of breast: Secondary | ICD-10-CM | POA: Diagnosis not present

## 2023-04-14 ENCOUNTER — Telehealth: Payer: Self-pay | Admitting: *Deleted

## 2023-04-14 NOTE — Telephone Encounter (Signed)
Procedure: Colonoscopy  Estimated body mass index is 20.07 kg/m as calculated from the following:   Height as of 02/13/23: 5\' 1"  (1.549 m).   Weight as of 02/13/23: 106 lb 3.2 oz (48.2 kg).   Have you had a colonoscopy before?  04/21/18, Dr. Darrick Penna  Do you have family history of colon cancer?  no  Do you have a family history of polyps? yes  Previous colonoscopy with polyps removed? yes  Do you have a history colorectal cancer?   no  Are you diabetic?  no  Do you have a prosthetic or mechanical heart valve? no  Do you have a pacemaker/defibrillator?   no  Have you had endocarditis/atrial fibrillation?  no  Do you use supplemental oxygen/CPAP?  no  Have you had joint replacement within the last 12 months?  no  Do you tend to be constipated or have to use laxatives?  no   Do you have history of alcohol use? If yes, how much and how often.  no  Do you have history or are you using drugs? If yes, what do are you  using?  no  Have you ever had a stroke/heart attack?    Have you ever had a heart or other vascular stent placed,?  Do you take weight loss medication? no  female patients,: have you had a hysterectomy? no                              are you post menopausal?  no                              do you still have your menstrual cycle? no    Date of last menstrual period?   Do you take any blood-thinning medications such as: (Plavix, aspirin, Coumadin, Aggrenox, Brilinta, Xarelto, Eliquis, Pradaxa, Savaysa or Effient)? no  If yes we need the name, milligram, dosage and who is prescribing doctor:               Current Outpatient Medications  Medication Sig Dispense Refill   amLODipine (NORVASC) 10 MG tablet Take 1 tablet (10 mg total) by mouth daily. 90 tablet 3   benzonatate (TESSALON) 100 MG capsule Take 1 capsule (100 mg total) by mouth 2 (two) times daily. 30 capsule 2   buprenorphine (BUTRANS) 10 MCG/HR PTWK Place 1 patch onto the skin once a week.      calcium carbonate (TUMS - DOSED IN MG ELEMENTAL CALCIUM) 500 MG chewable tablet Chew 1 tablet (200 mg of elemental calcium total) by mouth 3 (three) times daily with meals. 90 tablet 6   clotrimazole (CLOTRIMAZOLE AF) 1 % cream Apply 1 application topically 2 (two) times daily. To affected area for 1-2 weeks 30 g 0   fluticasone (FLONASE) 50 MCG/ACT nasal spray USE TWO SPRAY(S) IN EACH NOSTRIL ONCE DAILY AS NEEDED FOR ALLERGY OR RHINITIS 16 g 2   gabapentin (NEURONTIN) 400 MG capsule Take 1 capsule by mouth 4 times daily 90 capsule 0   lactase (LACTAID) 3000 units tablet Take 1 tablet (3,000 Units total) by mouth daily as needed (lactose intolerance). 90 tablet 1   Lidocaine HCl 4 % LIQD Apply 1 application topically 3 (three) times daily as needed (pain).     magnesium hydroxide (MILK OF MAGNESIA) 400 MG/5ML suspension Take 30 mLs by mouth daily as needed for mild constipation.  Menthol 10 % AERO Apply 1 application topically 3 (three) times daily as needed (pain).     nystatin (MYCOSTATIN) 100000 UNIT/ML suspension TAKE 5 MLS BY MOUTH 4 TIMES DAILY AS NEEDED. 500 mL 0   SYMBICORT 80-4.5 MCG/ACT inhaler Inhale 2 puffs by mouth twice daily 33 g 3   triamcinolone (KENALOG) 0.1 % Apply 1 application topically 2 (two) times daily. 30 g 0   VENTOLIN HFA 108 (90 Base) MCG/ACT inhaler INHALE 2 PUFFS BY MOUTH EVERY 6 HOURS AS NEEDED FOR WHEEZING OR SHORTNESS OF BREATH 18 g 5   No current facility-administered medications for this visit.    Allergies  Allergen Reactions   Naproxen Shortness Of Breath    Chest pain   Dicyclomine Hcl Other (See Comments)    Vision changes   Duloxetine Other (See Comments)    Suicidal    Egg-Derived Products Diarrhea    abd pain   Lovastatin Other (See Comments)    Chest pain   Toradol [Ketorolac Tromethamine] Other (See Comments)    Headache. Non-effective   Tramadol Other (See Comments)    Headache. Non-effective.   Nexium [Esomeprazole Magnesium] Diarrhea     Abdominal pain

## 2023-05-06 ENCOUNTER — Other Ambulatory Visit: Payer: Self-pay | Admitting: Internal Medicine

## 2023-05-06 DIAGNOSIS — J410 Simple chronic bronchitis: Secondary | ICD-10-CM

## 2023-05-19 DIAGNOSIS — M5442 Lumbago with sciatica, left side: Secondary | ICD-10-CM | POA: Diagnosis not present

## 2023-05-19 DIAGNOSIS — G894 Chronic pain syndrome: Secondary | ICD-10-CM | POA: Diagnosis not present

## 2023-05-19 DIAGNOSIS — Z79891 Long term (current) use of opiate analgesic: Secondary | ICD-10-CM | POA: Diagnosis not present

## 2023-05-19 DIAGNOSIS — M549 Dorsalgia, unspecified: Secondary | ICD-10-CM | POA: Diagnosis not present

## 2023-05-19 DIAGNOSIS — M4326 Fusion of spine, lumbar region: Secondary | ICD-10-CM | POA: Diagnosis not present

## 2023-05-28 NOTE — Telephone Encounter (Signed)
Ok to schedule. Per last tcs, consider pediatric scope  ASA3 Bisacodyl 10mg  daily for 3 days before prep

## 2023-05-29 ENCOUNTER — Encounter (INDEPENDENT_AMBULATORY_CARE_PROVIDER_SITE_OTHER): Payer: Self-pay | Admitting: *Deleted

## 2023-05-29 NOTE — Telephone Encounter (Signed)
Pt has been scheduled with Dr.Carver on 06/23/23. Will come by office to pick up sample of prep and instructions.

## 2023-05-29 NOTE — Telephone Encounter (Addendum)
Patient called and requested to add EGD to procedure scheduled with Dr. Marletta Lor. Scheduled appt with Dr. Marletta Lor next week. Aware [address] location

## 2023-05-30 ENCOUNTER — Encounter (INDEPENDENT_AMBULATORY_CARE_PROVIDER_SITE_OTHER): Payer: Self-pay | Admitting: *Deleted

## 2023-06-04 ENCOUNTER — Ambulatory Visit (INDEPENDENT_AMBULATORY_CARE_PROVIDER_SITE_OTHER): Payer: 59 | Admitting: Internal Medicine

## 2023-06-04 ENCOUNTER — Encounter: Payer: Self-pay | Admitting: Internal Medicine

## 2023-06-04 VITALS — BP 114/72 | HR 103 | Temp 98.4°F | Ht 61.0 in | Wt 101.2 lb

## 2023-06-04 DIAGNOSIS — R1310 Dysphagia, unspecified: Secondary | ICD-10-CM | POA: Diagnosis not present

## 2023-06-04 DIAGNOSIS — K222 Esophageal obstruction: Secondary | ICD-10-CM | POA: Diagnosis not present

## 2023-06-04 DIAGNOSIS — K50019 Crohn's disease of small intestine with unspecified complications: Secondary | ICD-10-CM | POA: Diagnosis not present

## 2023-06-04 DIAGNOSIS — R7989 Other specified abnormal findings of blood chemistry: Secondary | ICD-10-CM

## 2023-06-04 DIAGNOSIS — R1319 Other dysphagia: Secondary | ICD-10-CM

## 2023-06-04 DIAGNOSIS — K760 Fatty (change of) liver, not elsewhere classified: Secondary | ICD-10-CM

## 2023-06-04 DIAGNOSIS — K219 Gastro-esophageal reflux disease without esophagitis: Secondary | ICD-10-CM | POA: Diagnosis not present

## 2023-06-04 MED ORDER — DEXLANSOPRAZOLE 60 MG PO CPDR: 60.0000 mg | DELAYED_RELEASE_CAPSULE | Freq: Every day | ORAL | 3 refills | Status: DC

## 2023-06-04 NOTE — Patient Instructions (Signed)
We will proceed with upper endoscopy at the same time as colonoscopy to further evaluate your reflux and difficulty with food getting stuck.  I may elect to stretch your esophagus depending on findings.  I will refill your Dexilant today and have sent this to your pharmacy.  I am going to order an ultrasound of your liver to further evaluate slightly elevated liver tests as well as your history of fatty liver disease.  It was very nice seeing you again today.  Dr. Marletta Lor

## 2023-06-04 NOTE — Progress Notes (Signed)
Referring Provider: Billie Lade, MD Primary Care Physician:  Billie Lade, MD Primary GI:  Dr. Marletta Lor  Chief Complaint  Patient presents with   Dysphagia    Patient here today due to recent issues with dysphagia. She is already scheduled to have a Tcs on 06/23/2023, and would like to add on an Egd. Patient says she was previously on an Ppi and is no longer taking one.     HPI:   [name] [name] is a 68 y.o. female who presents to clinic today for follow-up visit.  Has not been seen since August 2022 (did not follow up).  Crohn's disease: History of Crohn's disease s/p ileocecectomy 2006 with surgical remission. Not chronically on any management for this.  CT abdomen pelvis with contrast 12/08/2020: Hepatic steatosis, small hiatal hernia with distal esophageal wall thickening otherwise unremarkable.  Colonoscopy 04/21/2018 external/internal hemorrhoids, sigmoid diverticulosis, 2 polyps removed (tubular adenomas on pathology)  States her bowels are moving well.  No blood in her stool or mucus in her stool.  Does note some right upper quadrant abdominal pain.  Chronic GERD, esophageal dysphagia: Historically well-controlled on dexlansoprazole, has not been on this for a while though and noting breakthrough symptoms.  Taking Tums regularly.  Also notes issues with esophageal dysphagia.  Feels like food gets stuck in her epigastric region.  EGD 11/24/2018 with distal esophageal stenosis status post dilation up to 17 mm.  Small hiatal hernia.  Gastritis.  Normal duodenum.  Hepatic steatosis, abnormal LFTS: Blood work 02/13/2023 with AST 56, ALT 33, T. bili 0.5, alk phos 135.  Last imaging of her liver performed with CT as above.   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   Crohn disease (HCC)     CTS (carpal tunnel syndrome)    Diarrhea MULITIFACTORIAL   IBS, LACTOSE INTOLERANCE, SBBO, BILE-SALT   Elevated liver enzymes 2009: BMI 24 ?Etoh ALT 94, AST 51,  NEG ANA, qIGs& ASMA   MAY 2012 AST 52 ALT 38   Esophageal stricture 2009   GERD (gastroesophageal reflux disease)    Hemorrhoid    Hyperlipemia    Hypertension    Inflammatory bowel disease 2006 CD   SURGICAL REMISSION   LBP (low back pain)    Mental disorder    Migraine    Osteoporosis    PONV (postoperative nausea and vomiting)    Shortness of breath    exertion and humidity   Shoulder pain    right   Sleep apnea    Stop Bang Cock score of 4    Past Surgical History:  Procedure Laterality Date   BACK SURGERY     BACK SURGERY  07/16/2018   BILATERAL SALPINGOOPHORECTOMY     BIOPSY  12/24/2011   Procedure: BIOPSY;  Surgeon: West Bali, MD;  Location: [facility];  Service: Endoscopy;;  Gastric Biopsies   BIOPSY N/A 06/30/2012   Procedure: BIOPSY;  Surgeon: West Bali, MD;  Location: [facility];  Service: Endoscopy;  Laterality: N/A;  Gastric and Esophageal Biopsies   CARPAL TUNNEL RELEASE  LEFT   CATARACT EXTRACTION W/PHACO Right 07/30/2016   Procedure: CATARACT EXTRACTION PHACO AND INTRAOCULAR LENS PLACEMENT (IOC);  Surgeon: Jethro Bolus, MD;  Location: [facility];  Service: Ophthalmology;  Laterality: Right;  CDE: 5.45   CATARACT EXTRACTION W/PHACO Left 08/13/2016   Procedure: CATARACT EXTRACTION PHACO AND INTRAOCULAR LENS PLACEMENT (IOC);  Surgeon: Jethro Bolus, MD;  Location: [facility];  Service: Ophthalmology;  Laterality: Left;  CDE: 5.59   COLON SURGERY     COLONOSCOPY  JAN 2009 DIARRHEA, ABD PAIN, BRBPR   ANASTOMOTIC ULCER(3MM), IH ZO:XWRUEA ULCER, NL COLON bX   COLONOSCOPY WITH PROPOFOL N/A 04/21/2018   examined portion of the ileum normal, two 3-5 mm polyps, diverticulosis in the rectosigmoid and sigmoid colon, external and internal hemorrhoids, significant looping of the colon.  Pathology with tubular adenomas.   Recommendations to follow high-fiber diet and repeat colonoscopy in 5 to 10 years with peds colonoscope.   DILATION AND CURETTAGE OF UTERUS     ESOPHAGEAL DILATION  12/24/2011   SLF: A stricture was found in the distal esophagus/ Moderate gastritis/ MILD Duodenitis   ESOPHAGOGASTRODUODENOSCOPY  02/07/09   mild gastritis   ESOPHAGOGASTRODUODENOSCOPY (EGD) WITH PROPOFOL N/A 06/30/2012   SLF: 1. No definite stricture appreciated 2. small hiatal hernia 3. Gastritis   ESOPHAGOGASTRODUODENOSCOPY (EGD) WITH PROPOFOL N/A 02/20/2016   Procedure: ESOPHAGOGASTRODUODENOSCOPY (EGD) WITH PROPOFOL;  Surgeon: West Bali, MD;  Location: [facility];  Service: Endoscopy;  Laterality: N/A;  930   ESOPHAGOGASTRODUODENOSCOPY (EGD) WITH PROPOFOL N/A 11/24/2018   benign-appearing esophageal peptic stricture s/p dilation, small hiatal hernia, moderate erosive gastritis.    EYE SURGERY     gum flap Bilateral    HEMICOLECTOMY  RIGHT 2006   HERNIA REPAIR     LUMBAR DISC SURGERY     POLYPECTOMY  04/21/2018   Procedure: POLYPECTOMY;  Surgeon: West Bali, MD;  Location: [facility];  Service: Endoscopy;;  (colon)   SAVORY DILATION N/A 06/30/2012   Procedure: SAVORY DILATION;  Surgeon: West Bali, MD;  Location: [facility];  Service: Endoscopy;  Laterality: N/A;  12.88mm , 14mm, 15mm   SAVORY DILATION N/A 02/20/2016   Procedure: SAVORY DILATION;  Surgeon: West Bali, MD;  Location: [facility];  Service: Endoscopy;  Laterality: N/A;   SAVORY DILATION N/A 11/24/2018   Procedure: SAVORY DILATION;  Surgeon: West Bali, MD;  Location: [facility];  Service: Endoscopy;  Laterality: N/A;   UPPER GASTROINTESTINAL ENDOSCOPY  JAN 2009 ABD PAIN   GASTRITIS, ESO RING   UPPER GASTROINTESTINAL ENDOSCOPY  OCT 2010 ABD PAIN, DYSPHAGIA   DIL , GASTRITIS, NL DUODENUM   UPPER GASTROINTESTINAL ENDOSCOPY  OCT 2010 DYSPHAGIA   DIL 17 MM, GASTRITIS, NL DUODENUM   vocal cord surgery  Sep 20, 2010   precancerous  areas removed   wisdom tooth extraction Right    pin placement due to broken jaw    Current Outpatient Medications  Medication Sig Dispense Refill   amLODipine (NORVASC) 10 MG tablet Take 1 tablet (10 mg total) by mouth daily. 90 tablet 3   benzonatate (TESSALON) 100 MG capsule Take 1 capsule (100 mg total) by mouth 2 (two) times daily. 30 capsule 2   BUPRENORPHINE HCL SL Place under the tongue. 4mg /1 mg QID prn     calcium carbonate (TUMS - DOSED IN MG ELEMENTAL CALCIUM) 500 MG chewable tablet Chew 1 tablet (200 mg of elemental calcium total) by mouth 3 (three) times daily with meals. 90 tablet 6   clotrimazole (CLOTRIMAZOLE AF) 1 % cream Apply 1 application topically 2 (two) times daily. To affected area for 1-2 weeks 30 g  0   fluticasone (FLONASE) 50 MCG/ACT nasal spray USE TWO SPRAY(S) IN EACH NOSTRIL ONCE DAILY AS NEEDED FOR ALLERGY OR RHINITIS 16 g 2   gabapentin (NEURONTIN) 400 MG capsule Take 1 capsule by mouth 4 times daily 90 capsule 0   lactase (LACTAID) 3000 units tablet Take 1 tablet (3,000 Units total) by mouth daily as needed (lactose intolerance). 90 tablet 1   Lidocaine HCl 4 % LIQD Apply 1 application topically 3 (three) times daily as needed (pain).     magnesium hydroxide (MILK OF MAGNESIA) 400 MG/5ML suspension Take 30 mLs by mouth daily as needed for mild constipation.     Menthol 10 % AERO Apply 1 application topically 3 (three) times daily as needed (pain).     nystatin (MYCOSTATIN) 100000 UNIT/ML suspension TAKE 5 MLS BY MOUTH 4 TIMES DAILY AS NEEDED. 500 mL 0   SYMBICORT 80-4.5 MCG/ACT inhaler Inhale 2 puffs by mouth twice daily 33 g 0   triamcinolone (KENALOG) 0.1 % Apply 1 application topically 2 (two) times daily. 30 g 0   VENTOLIN HFA 108 (90 Base) MCG/ACT inhaler INHALE 2 PUFFS BY MOUTH EVERY 6 HOURS AS NEEDED FOR WHEEZING FOR SHORTNESS OF BREATH 18 g 0   No current facility-administered medications for this visit.    Allergies as of 06/04/2023 - Review  Complete 06/04/2023  Allergen Reaction Noted   Naproxen Shortness Of Breath 01/09/2016   Dicyclomine hcl Other (See Comments) 01/24/2021   Duloxetine Other (See Comments) 01/24/2021   Egg-derived products Diarrhea 02/20/2018   Lovastatin Other (See Comments) 09/25/2010   Toradol [ketorolac tromethamine] Other (See Comments) 02/20/2016   Tramadol Other (See Comments) 02/20/2016   Nexium [esomeprazole magnesium] Diarrhea 06/12/2012    Family History  Problem Relation Age of Onset   Hypothyroidism Mother    Heart disease Father    Parkinson's disease Father    Hypothyroidism Daughter    Heart attack Paternal Grandfather    Alzheimer's disease Paternal Grandmother    Heart attack Maternal Grandfather    Crohn's disease Sister    Other Sister        was murdered   Crohn's disease Maternal Uncle    Colon cancer Neg Hx    Colon polyps Neg Hx     Social History   Socioeconomic History   Marital status: Single    Spouse name: Deniece Portela   Number of children: 2   Years of education: GED   Highest education level: Not on file  Occupational History    Comment: disabled  Tobacco Use   Smoking status: Every Day    Current packs/day: 0.25    Average packs/day: 0.3 packs/day for 30.0 years (7.5 ttl pk-yrs)    Types: Cigarettes   Smokeless tobacco: Never  Vaping Use   Vaping status: Never Used  Substance and Sexual Activity   Alcohol use: Not Currently    Comment: wine/beer a few times a week    Drug use: No   Sexual activity: Not Currently    Birth control/protection: Post-menopausal  Other Topics Concern   Not on file  Social History Narrative   Lives with husband   no caffiene   Social Drivers of Health   Financial Resource Strain: Low Risk  (01/14/2022)   Overall Financial Resource Strain (CARDIA)    Difficulty of Paying Living Expenses: Not hard at all  Food Insecurity: No Food Insecurity (01/14/2022)   Hunger Vital Sign    Worried About Running Out of Food in the  Last  Year: Never true    Ran Out of Food in the Last Year: Never true  Transportation Needs: No Transportation Needs (01/14/2022)   PRAPARE - Administrator, Civil Service (Medical): No    Lack of Transportation (Non-Medical): No  Physical Activity: Insufficiently Active (01/14/2022)   Exercise Vital Sign    Days of Exercise per Week: 2 days    Minutes of Exercise per Session: 20 min  Stress: No Stress Concern Present (01/14/2022)   [employer] of Occupational Health - Occupational Stress Questionnaire    Feeling of Stress : Not at all  Social Connections: Moderately Isolated (01/14/2022)   Social Connection and Isolation Panel [NHANES]    Frequency of Communication with Friends and Family: Three times a week    Frequency of Social Gatherings with Friends and Family: Once a week    Attends Religious Services: Never    Database administrator or Organizations: No    Attends Banker Meetings: Never    Marital Status: Married    Subjective: Review of Systems  Constitutional:  Negative for chills and fever.  HENT:  Negative for congestion and hearing loss.   Eyes:  Negative for blurred vision and double vision.  Respiratory:  Negative for cough and shortness of breath.   Cardiovascular:  Negative for chest pain and palpitations.  Gastrointestinal:  Positive for abdominal pain and heartburn. Negative for blood in stool, constipation, diarrhea, melena and vomiting.       Dysphagia  Genitourinary:  Negative for dysuria and urgency.  Musculoskeletal:  Negative for joint pain and myalgias.  Skin:  Negative for itching and rash.  Neurological:  Negative for dizziness and headaches.  Psychiatric/Behavioral:  Negative for depression. The patient is not nervous/anxious.      Objective: BP 114/72 (BP Location: Left Arm, Patient Position: Sitting, Cuff Size: Normal)   Pulse (!) 103   Temp 98.4 F (36.9 C) (Temporal)   Ht 5\' 1"  (1.549 m)   Wt 101 lb 3.2 oz (45.9 kg)    BMI 19.12 kg/m  Physical Exam Constitutional:      Appearance: Normal appearance.  HENT:     Head: Normocephalic and atraumatic.  Eyes:     Extraocular Movements: Extraocular movements intact.     Conjunctiva/sclera: Conjunctivae normal.  Cardiovascular:     Rate and Rhythm: Normal rate and regular rhythm.  Pulmonary:     Effort: Pulmonary effort is normal.     Breath sounds: Normal breath sounds.  Abdominal:     General: Bowel sounds are normal.     Palpations: Abdomen is soft.  Musculoskeletal:        General: No swelling. Normal range of motion.     Cervical back: Normal range of motion and neck supple.  Skin:    General: Skin is warm and dry.     Coloration: Skin is not jaundiced.  Neurological:     General: No focal deficit present.     Mental Status: She is alert and oriented to person, place, and time.  Psychiatric:        Mood and Affect: Mood normal.        Behavior: Behavior normal.      Assessment/Plan:  1.  Crohn's disease-s/p ileocecectomy 2006 with surgical remission. Not chronically on any management for this.  Needs colonoscopy now which is scheduled for next month.  2.  Chronic GERD, esophageal dysphagia-symptoms worsening.  Will restart on Dexilant, sent to pharmacy.  Will add EGD with possible dilation to upcoming colonoscopy.  The risks including infection, bleed, or perforation as well as benefits, limitations, alternatives and imponderables have been reviewed with the patient. Potential for esophageal dilation, biopsy, etc. have also been reviewed.  Questions have been answered. All parties agreeable.  3.  Hepatic steatosis, abnormal LFTs-likely MASH,  Fibrosis 4 Score = 3.18 (High risk)        Interpretation for patients with NAFLD          <1.30       -  F0-F1 (Low risk)          1.30-2.67 -  Indeterminate           >2.67      -  F3-F4 (High risk)      Validated for ages 56-65  Will proceed with right upper quadrant ultrasound with  elastography to further evaluate.  May need further serological workup given her ongoing abnormal LFTs.  Follow-up after procedures.    06/04/2023 9:10 AM   Disclaimer: This note was dictated with voice recognition software. Similar sounding words can inadvertently be transcribed and may not be corrected upon review.

## 2023-06-04 NOTE — H&P (View-Only) (Signed)
 Referring Provider: Billie Lade, MD Primary Care Physician:  Billie Lade, MD Primary GI:  Dr. Marletta Lor  Chief Complaint  Patient presents with   Dysphagia    Patient here today due to recent issues with dysphagia. She is already scheduled to have a Tcs on 06/23/2023, and would like to add on an Egd. Patient says she was previously on an Ppi and is no longer taking one.     HPI:   [name] [name] is a 68 y.o. female who presents to clinic today for follow-up visit.  Has not been seen since August 2022 (did not follow up).  Crohn's disease: History of Crohn's disease s/p ileocecectomy 2006 with surgical remission. Not chronically on any management for this.  CT abdomen pelvis with contrast 12/08/2020: Hepatic steatosis, small hiatal hernia with distal esophageal wall thickening otherwise unremarkable.  Colonoscopy 04/21/2018 external/internal hemorrhoids, sigmoid diverticulosis, 2 polyps removed (tubular adenomas on pathology)  States her bowels are moving well.  No blood in her stool or mucus in her stool.  Does note some right upper quadrant abdominal pain.  Chronic GERD, esophageal dysphagia: Historically well-controlled on dexlansoprazole, has not been on this for a while though and noting breakthrough symptoms.  Taking Tums regularly.  Also notes issues with esophageal dysphagia.  Feels like food gets stuck in her epigastric region.  EGD 11/24/2018 with distal esophageal stenosis status post dilation up to 17 mm.  Small hiatal hernia.  Gastritis.  Normal duodenum.  Hepatic steatosis, abnormal LFTS: Blood work 02/13/2023 with AST 56, ALT 33, T. bili 0.5, alk phos 135.  Last imaging of her liver performed with CT as above.   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   Crohn disease (HCC)     CTS (carpal tunnel syndrome)    Diarrhea MULITIFACTORIAL   IBS, LACTOSE INTOLERANCE, SBBO, BILE-SALT   Elevated liver enzymes 2009: BMI 24 ?Etoh ALT 94, AST 51,  NEG ANA, qIGs& ASMA   MAY 2012 AST 52 ALT 38   Esophageal stricture 2009   GERD (gastroesophageal reflux disease)    Hemorrhoid    Hyperlipemia    Hypertension    Inflammatory bowel disease 2006 CD   SURGICAL REMISSION   LBP (low back pain)    Mental disorder    Migraine    Osteoporosis    PONV (postoperative nausea and vomiting)    Shortness of breath    exertion and humidity   Shoulder pain    right   Sleep apnea    Stop Bang Cock score of 4    Past Surgical History:  Procedure Laterality Date   BACK SURGERY     BACK SURGERY  07/16/2018   BILATERAL SALPINGOOPHORECTOMY     BIOPSY  12/24/2011   Procedure: BIOPSY;  Surgeon: West Bali, MD;  Location: [facility];  Service: Endoscopy;;  Gastric Biopsies   BIOPSY N/A 06/30/2012   Procedure: BIOPSY;  Surgeon: West Bali, MD;  Location: [facility];  Service: Endoscopy;  Laterality: N/A;  Gastric and Esophageal Biopsies   CARPAL TUNNEL RELEASE  LEFT   CATARACT EXTRACTION W/PHACO Right 07/30/2016   Procedure: CATARACT EXTRACTION PHACO AND INTRAOCULAR LENS PLACEMENT (IOC);  Surgeon: Jethro Bolus, MD;  Location: [facility];  Service: Ophthalmology;  Laterality: Right;  CDE: 5.45   CATARACT EXTRACTION W/PHACO Left 08/13/2016   Procedure: CATARACT EXTRACTION PHACO AND INTRAOCULAR LENS PLACEMENT (IOC);  Surgeon: Jethro Bolus, MD;  Location: [facility];  Service: Ophthalmology;  Laterality: Left;  CDE: 5.59   COLON SURGERY     COLONOSCOPY  JAN 2009 DIARRHEA, ABD PAIN, BRBPR   ANASTOMOTIC ULCER(3MM), IH ZO:XWRUEA ULCER, NL COLON bX   COLONOSCOPY WITH PROPOFOL N/A 04/21/2018   examined portion of the ileum normal, two 3-5 mm polyps, diverticulosis in the rectosigmoid and sigmoid colon, external and internal hemorrhoids, significant looping of the colon.  Pathology with tubular adenomas.   Recommendations to follow high-fiber diet and repeat colonoscopy in 5 to 10 years with peds colonoscope.   DILATION AND CURETTAGE OF UTERUS     ESOPHAGEAL DILATION  12/24/2011   SLF: A stricture was found in the distal esophagus/ Moderate gastritis/ MILD Duodenitis   ESOPHAGOGASTRODUODENOSCOPY  02/07/09   mild gastritis   ESOPHAGOGASTRODUODENOSCOPY (EGD) WITH PROPOFOL N/A 06/30/2012   SLF: 1. No definite stricture appreciated 2. small hiatal hernia 3. Gastritis   ESOPHAGOGASTRODUODENOSCOPY (EGD) WITH PROPOFOL N/A 02/20/2016   Procedure: ESOPHAGOGASTRODUODENOSCOPY (EGD) WITH PROPOFOL;  Surgeon: West Bali, MD;  Location: [facility];  Service: Endoscopy;  Laterality: N/A;  930   ESOPHAGOGASTRODUODENOSCOPY (EGD) WITH PROPOFOL N/A 11/24/2018   benign-appearing esophageal peptic stricture s/p dilation, small hiatal hernia, moderate erosive gastritis.    EYE SURGERY     gum flap Bilateral    HEMICOLECTOMY  RIGHT 2006   HERNIA REPAIR     LUMBAR DISC SURGERY     POLYPECTOMY  04/21/2018   Procedure: POLYPECTOMY;  Surgeon: West Bali, MD;  Location: [facility];  Service: Endoscopy;;  (colon)   SAVORY DILATION N/A 06/30/2012   Procedure: SAVORY DILATION;  Surgeon: West Bali, MD;  Location: [facility];  Service: Endoscopy;  Laterality: N/A;  12.88mm , 14mm, 15mm   SAVORY DILATION N/A 02/20/2016   Procedure: SAVORY DILATION;  Surgeon: West Bali, MD;  Location: [facility];  Service: Endoscopy;  Laterality: N/A;   SAVORY DILATION N/A 11/24/2018   Procedure: SAVORY DILATION;  Surgeon: West Bali, MD;  Location: [facility];  Service: Endoscopy;  Laterality: N/A;   UPPER GASTROINTESTINAL ENDOSCOPY  JAN 2009 ABD PAIN   GASTRITIS, ESO RING   UPPER GASTROINTESTINAL ENDOSCOPY  OCT 2010 ABD PAIN, DYSPHAGIA   DIL , GASTRITIS, NL DUODENUM   UPPER GASTROINTESTINAL ENDOSCOPY  OCT 2010 DYSPHAGIA   DIL 17 MM, GASTRITIS, NL DUODENUM   vocal cord surgery  Sep 20, 2010   precancerous  areas removed   wisdom tooth extraction Right    pin placement due to broken jaw    Current Outpatient Medications  Medication Sig Dispense Refill   amLODipine (NORVASC) 10 MG tablet Take 1 tablet (10 mg total) by mouth daily. 90 tablet 3   benzonatate (TESSALON) 100 MG capsule Take 1 capsule (100 mg total) by mouth 2 (two) times daily. 30 capsule 2   BUPRENORPHINE HCL SL Place under the tongue. 4mg /1 mg QID prn     calcium carbonate (TUMS - DOSED IN MG ELEMENTAL CALCIUM) 500 MG chewable tablet Chew 1 tablet (200 mg of elemental calcium total) by mouth 3 (three) times daily with meals. 90 tablet 6   clotrimazole (CLOTRIMAZOLE AF) 1 % cream Apply 1 application topically 2 (two) times daily. To affected area for 1-2 weeks 30 g  0   fluticasone (FLONASE) 50 MCG/ACT nasal spray USE TWO SPRAY(S) IN EACH NOSTRIL ONCE DAILY AS NEEDED FOR ALLERGY OR RHINITIS 16 g 2   gabapentin (NEURONTIN) 400 MG capsule Take 1 capsule by mouth 4 times daily 90 capsule 0   lactase (LACTAID) 3000 units tablet Take 1 tablet (3,000 Units total) by mouth daily as needed (lactose intolerance). 90 tablet 1   Lidocaine HCl 4 % LIQD Apply 1 application topically 3 (three) times daily as needed (pain).     magnesium hydroxide (MILK OF MAGNESIA) 400 MG/5ML suspension Take 30 mLs by mouth daily as needed for mild constipation.     Menthol 10 % AERO Apply 1 application topically 3 (three) times daily as needed (pain).     nystatin (MYCOSTATIN) 100000 UNIT/ML suspension TAKE 5 MLS BY MOUTH 4 TIMES DAILY AS NEEDED. 500 mL 0   SYMBICORT 80-4.5 MCG/ACT inhaler Inhale 2 puffs by mouth twice daily 33 g 0   triamcinolone (KENALOG) 0.1 % Apply 1 application topically 2 (two) times daily. 30 g 0   VENTOLIN HFA 108 (90 Base) MCG/ACT inhaler INHALE 2 PUFFS BY MOUTH EVERY 6 HOURS AS NEEDED FOR WHEEZING FOR SHORTNESS OF BREATH 18 g 0   No current facility-administered medications for this visit.    Allergies as of 06/04/2023 - Review  Complete 06/04/2023  Allergen Reaction Noted   Naproxen Shortness Of Breath 01/09/2016   Dicyclomine hcl Other (See Comments) 01/24/2021   Duloxetine Other (See Comments) 01/24/2021   Egg-derived products Diarrhea 02/20/2018   Lovastatin Other (See Comments) 09/25/2010   Toradol [ketorolac tromethamine] Other (See Comments) 02/20/2016   Tramadol Other (See Comments) 02/20/2016   Nexium [esomeprazole magnesium] Diarrhea 06/12/2012    Family History  Problem Relation Age of Onset   Hypothyroidism Mother    Heart disease Father    Parkinson's disease Father    Hypothyroidism Daughter    Heart attack Paternal Grandfather    Alzheimer's disease Paternal Grandmother    Heart attack Maternal Grandfather    Crohn's disease Sister    Other Sister        was murdered   Crohn's disease Maternal Uncle    Colon cancer Neg Hx    Colon polyps Neg Hx     Social History   Socioeconomic History   Marital status: Single    Spouse name: Deniece Portela   Number of children: 2   Years of education: GED   Highest education level: Not on file  Occupational History    Comment: disabled  Tobacco Use   Smoking status: Every Day    Current packs/day: 0.25    Average packs/day: 0.3 packs/day for 30.0 years (7.5 ttl pk-yrs)    Types: Cigarettes   Smokeless tobacco: Never  Vaping Use   Vaping status: Never Used  Substance and Sexual Activity   Alcohol use: Not Currently    Comment: wine/beer a few times a week    Drug use: No   Sexual activity: Not Currently    Birth control/protection: Post-menopausal  Other Topics Concern   Not on file  Social History Narrative   Lives with husband   no caffiene   Social Drivers of Health   Financial Resource Strain: Low Risk  (01/14/2022)   Overall Financial Resource Strain (CARDIA)    Difficulty of Paying Living Expenses: Not hard at all  Food Insecurity: No Food Insecurity (01/14/2022)   Hunger Vital Sign    Worried About Running Out of Food in the  Last  Year: Never true    Ran Out of Food in the Last Year: Never true  Transportation Needs: No Transportation Needs (01/14/2022)   PRAPARE - Administrator, Civil Service (Medical): No    Lack of Transportation (Non-Medical): No  Physical Activity: Insufficiently Active (01/14/2022)   Exercise Vital Sign    Days of Exercise per Week: 2 days    Minutes of Exercise per Session: 20 min  Stress: No Stress Concern Present (01/14/2022)   [employer] of Occupational Health - Occupational Stress Questionnaire    Feeling of Stress : Not at all  Social Connections: Moderately Isolated (01/14/2022)   Social Connection and Isolation Panel [NHANES]    Frequency of Communication with Friends and Family: Three times a week    Frequency of Social Gatherings with Friends and Family: Once a week    Attends Religious Services: Never    Database administrator or Organizations: No    Attends Banker Meetings: Never    Marital Status: Married    Subjective: Review of Systems  Constitutional:  Negative for chills and fever.  HENT:  Negative for congestion and hearing loss.   Eyes:  Negative for blurred vision and double vision.  Respiratory:  Negative for cough and shortness of breath.   Cardiovascular:  Negative for chest pain and palpitations.  Gastrointestinal:  Positive for abdominal pain and heartburn. Negative for blood in stool, constipation, diarrhea, melena and vomiting.       Dysphagia  Genitourinary:  Negative for dysuria and urgency.  Musculoskeletal:  Negative for joint pain and myalgias.  Skin:  Negative for itching and rash.  Neurological:  Negative for dizziness and headaches.  Psychiatric/Behavioral:  Negative for depression. The patient is not nervous/anxious.      Objective: BP 114/72 (BP Location: Left Arm, Patient Position: Sitting, Cuff Size: Normal)   Pulse (!) 103   Temp 98.4 F (36.9 C) (Temporal)   Ht 5\' 1"  (1.549 m)   Wt 101 lb 3.2 oz (45.9 kg)    BMI 19.12 kg/m  Physical Exam Constitutional:      Appearance: Normal appearance.  HENT:     Head: Normocephalic and atraumatic.  Eyes:     Extraocular Movements: Extraocular movements intact.     Conjunctiva/sclera: Conjunctivae normal.  Cardiovascular:     Rate and Rhythm: Normal rate and regular rhythm.  Pulmonary:     Effort: Pulmonary effort is normal.     Breath sounds: Normal breath sounds.  Abdominal:     General: Bowel sounds are normal.     Palpations: Abdomen is soft.  Musculoskeletal:        General: No swelling. Normal range of motion.     Cervical back: Normal range of motion and neck supple.  Skin:    General: Skin is warm and dry.     Coloration: Skin is not jaundiced.  Neurological:     General: No focal deficit present.     Mental Status: She is alert and oriented to person, place, and time.  Psychiatric:        Mood and Affect: Mood normal.        Behavior: Behavior normal.      Assessment/Plan:  1.  Crohn's disease-s/p ileocecectomy 2006 with surgical remission. Not chronically on any management for this.  Needs colonoscopy now which is scheduled for next month.  2.  Chronic GERD, esophageal dysphagia-symptoms worsening.  Will restart on Dexilant, sent to pharmacy.  Will add EGD with possible dilation to upcoming colonoscopy.  The risks including infection, bleed, or perforation as well as benefits, limitations, alternatives and imponderables have been reviewed with the patient. Potential for esophageal dilation, biopsy, etc. have also been reviewed.  Questions have been answered. All parties agreeable.  3.  Hepatic steatosis, abnormal LFTs-likely MASH,  Fibrosis 4 Score = 3.18 (High risk)        Interpretation for patients with NAFLD          <1.30       -  F0-F1 (Low risk)          1.30-2.67 -  Indeterminate           >2.67      -  F3-F4 (High risk)      Validated for ages 56-65  Will proceed with right upper quadrant ultrasound with  elastography to further evaluate.  May need further serological workup given her ongoing abnormal LFTs.  Follow-up after procedures.    06/04/2023 9:10 AM   Disclaimer: This note was dictated with voice recognition software. Similar sounding words can inadvertently be transcribed and may not be corrected upon review.

## 2023-06-06 ENCOUNTER — Ambulatory Visit (HOSPITAL_COMMUNITY)
Admission: RE | Admit: 2023-06-06 | Discharge: 2023-06-06 | Disposition: A | Discharge status: Home / Self Care | Payer: 59 | Source: Ambulatory Visit | Attending: Internal Medicine | Admitting: Internal Medicine

## 2023-06-06 DIAGNOSIS — R7989 Other specified abnormal findings of blood chemistry: Secondary | ICD-10-CM | POA: Insufficient documentation

## 2023-06-06 DIAGNOSIS — K760 Fatty (change of) liver, not elsewhere classified: Secondary | ICD-10-CM | POA: Insufficient documentation

## 2023-06-06 DIAGNOSIS — K7689 Other specified diseases of liver: Secondary | ICD-10-CM | POA: Diagnosis not present

## 2023-06-09 ENCOUNTER — Other Ambulatory Visit: Payer: Self-pay | Admitting: Internal Medicine

## 2023-06-09 DIAGNOSIS — J302 Other seasonal allergic rhinitis: Secondary | ICD-10-CM

## 2023-06-09 DIAGNOSIS — J410 Simple chronic bronchitis: Secondary | ICD-10-CM

## 2023-06-10 ENCOUNTER — Encounter: Payer: Self-pay | Admitting: Internal Medicine

## 2023-06-10 ENCOUNTER — Ambulatory Visit (INDEPENDENT_AMBULATORY_CARE_PROVIDER_SITE_OTHER): Payer: 59 | Admitting: Internal Medicine

## 2023-06-10 VITALS — BP 117/76 | HR 83 | Ht 60.0 in | Wt 100.4 lb

## 2023-06-10 DIAGNOSIS — I10 Essential (primary) hypertension: Secondary | ICD-10-CM

## 2023-06-10 DIAGNOSIS — M25471 Effusion, right ankle: Secondary | ICD-10-CM | POA: Diagnosis not present

## 2023-06-10 DIAGNOSIS — M25474 Effusion, right foot: Secondary | ICD-10-CM | POA: Diagnosis not present

## 2023-06-10 DIAGNOSIS — M25472 Effusion, left ankle: Secondary | ICD-10-CM | POA: Diagnosis not present

## 2023-06-10 DIAGNOSIS — M25475 Effusion, left foot: Secondary | ICD-10-CM

## 2023-06-10 MED ORDER — AMLODIPINE BESYLATE 10 MG PO TABS
10.0000 mg | ORAL_TABLET | Freq: Every day | ORAL | Status: DC
Start: 2023-06-10 — End: 2023-07-29

## 2023-06-10 NOTE — Assessment & Plan Note (Signed)
 Presenting today for an acute visit endorsing acute on chronic bilateral ankle edema.  Edema has been worse over the last 2 weeks.  On exam there is 1+ bilateral ankle edema without overlying erythema or induration.  ROM of the ankles is intact.  There is no significant tenderness to palpation.  She is currently prescribed amlodipine  10 mg daily, which carries a known potential side effect of lower extremity edema.  Of note, she has also been drinking Pedialyte multiple times daily, which has a high sodium content.  Treatment options were discussed.  For now, she will remain on amlodipine  and reduce sodium intake.  She will return to care if symptoms worsen or fail to improve.  Otherwise, she will return for previously scheduled follow-up in April.

## 2023-06-10 NOTE — Patient Instructions (Signed)
 It was a pleasure to see you today.  Thank you for giving us  the opportunity to be involved in your care.  Below is a brief recap of your visit and next steps.  We will plan to see you again in April.  Summary No medication changes today Reduce sodium intake Follow up in April as scheduled but return to care before then if swelling does not improve

## 2023-06-10 NOTE — Progress Notes (Signed)
   Acute Office Visit  Subjective:     Patient ID: [name] [name], female    DOB: 1956-03-11, 68 y.o.   MRN: [id number]  Chief Complaint  Patient presents with   Joint Swelling    Swelling and pain in ankles    [name] [name] presents today for an acute visit endorsing a 2-week history of bilateral ankle swelling.  This is an acute on chronic issue that has occurred intermittently but has worsened over the last 2 weeks, the last week has been appreciably worse.  She describe onset of cramping/burning discomfort in her ankles and distal leg.  Aspercreme has been effective in alleviating her discomfort.  She does not have any additional concerns to discuss today.  Review of Systems  Cardiovascular:  Positive for leg swelling (Bilateral ankle edema).  All other systems reviewed and are negative.     Objective:    BP 117/76 (BP Location: Left Arm, Patient Position: Sitting, Cuff Size: Normal)   Pulse 83   Ht 5' (1.524 m)   Wt 100 lb 6.4 oz (45.5 kg)   SpO2 92%   BMI 19.61 kg/m   Physical Exam Vitals reviewed.  Musculoskeletal:        General: Swelling (1+ bilateral ankle edema.  No overlying erythema or induration.) present. No tenderness.       Assessment & Plan:   Problem List Items Addressed This Visit       Ankle edema, bilateral   Presenting today for an acute visit endorsing acute on chronic bilateral ankle edema.  Edema has been worse over the last 2 weeks.  On exam there is 1+ bilateral ankle edema without overlying erythema or induration.  ROM of the ankles is intact.  There is no significant tenderness to palpation.  She is currently prescribed amlodipine  10 mg daily, which carries a known potential side effect of lower extremity edema.  Of note, she has also been drinking Pedialyte multiple times daily, which has a high sodium content.  Treatment options were discussed.  For now, she will remain on amlodipine  and reduce sodium intake.  She will return to care if symptoms  worsen or fail to improve.  Otherwise, she will return for previously scheduled follow-up in April.      Meds ordered this encounter  Medications   amLODipine  (NORVASC ) 10 MG tablet    Sig: Take 1 tablet (10 mg total) by mouth daily.    Return if symptoms worsen or fail to improve.  Manus FORBES Fireman, MD

## 2023-06-18 NOTE — Patient Instructions (Signed)
[name] [name]  06/18/2023     @PREFPERIOPPHARMACY @   Your procedure is scheduled on  06/23/2023.   Report to [facility] at  0805 A.M.   Call this number if you have problems the morning of surgery:  [phone]  If you experience any cold or flu symptoms such as cough, fever, chills, shortness of breath, etc. between now and your scheduled surgery, please notify us at the above number.   Remember:        Use your inhalers before you come and bring your rescue inhaler with you.   Follow the diet and prep instructions given to you by the office.   You may drink clear liquids until 0605 am on 06/23/2023.    Clear liquids allowed are:                    Water, Juice (No red color; non-citric and without pulp; diabetics please choose diet or no sugar options), Carbonated beverages (diabetics please choose diet or no sugar options), Clear Tea (No creamer, milk, or cream, including half & half and powdered creamer), Black Coffee Only (No creamer, milk or cream, including half & half and powdered creamer), and Clear Sports drink (No red color; diabetics please choose diet or no sugar options)    Take these medicines the morning of surgery with A SIP OF WATER     amlodipine, dexlansoprazole, gabapentin, buprenophine.     Do not wear jewelry, make-up or nail polish, including gel polish,  artificial nails, or any other type of covering on natural nails (fingers and  toes).  Do not wear lotions, powders, or perfumes, or deodorant.  Do not shave 48 hours prior to surgery.  Men may shave face and neck.  Do not bring valuables to the hospital.  [facility] is not responsible for any belongings or valuables.  Contacts, dentures or bridgework may not be worn into surgery.  Leave your suitcase in the car.  After surgery it may be brought to your room.  For patients admitted to the hospital, discharge time will be determined by your treatment team.  Patients discharged the day  of surgery will not be allowed to drive home and must have someone with them for 24 hours.    Special instructions:   DO NOT smoke tobacco or vape for 24 hours before your procedure.  Please read over the following fact sheets that you were given. Anesthesia Post-op Instructions and Care and Recovery After Surgery      Upper Endoscopy, Adult, Care After After the procedure, it is common to have a sore throat. It is also common to have: Mild stomach pain or discomfort. Bloating. Nausea. Follow these instructions at home: The instructions below may help you care for yourself at home. Your health care provider may give you more instructions. If you have questions, ask your health care provider. If you were given a sedative during the procedure, it can affect you for several hours. Do not drive or operate machinery until your health care provider says that it is safe. If you will be going home right after the procedure, plan to have a responsible adult: Take you home from the hospital or clinic. You will not be allowed to drive. Care for you for the time you are told. Follow instructions from your health care provider about what you may eat and drink. Return to your normal activities as told by your health  care provider. Ask your health care provider what activities are safe for you. Take over-the-counter and prescription medicines only as told by your health care provider. Contact a health care provider if you: Have a sore throat that lasts longer than one day. Have trouble swallowing. Have a fever. Get help right away if you: Vomit blood or your vomit looks like coffee grounds. Have bloody, black, or tarry stools. Have a very bad sore throat or you cannot swallow. Have difficulty breathing or very bad pain in your chest or abdomen. These symptoms may be an emergency. Get help right away. Call 911. Do not wait to see if the symptoms will go away. Do not drive yourself to the  hospital. Summary After the procedure, it is common to have a sore throat, mild stomach discomfort, bloating, and nausea. If you were given a sedative during the procedure, it can affect you for several hours. Do not drive until your health care provider says that it is safe. Follow instructions from your health care provider about what you may eat and drink. Return to your normal activities as told by your health care provider. This information is not intended to replace advice given to you by your health care provider. Make sure you discuss any questions you have with your health care provider. Document Revised: 08/01/2021 Document Reviewed: 08/01/2021 Elsevier Patient Education  2024 [employer]Esophageal Dilatation Esophageal dilatation, or dilation, is done to stretch a blocked or narrowed part of your esophagus. The esophagus is the part of your body that moves food from your mouth to your stomach. You may need to have it stretched if: You have a lot of scar tissue and it makes it hard or painful to swallow. You have cancer of the esophagus. There's a problem with how food moves through your esophagus. In some cases, you may need to have this procedure done more than once. Tell a health care provider about: Any allergies you have. All medicines you're taking, including vitamins, herbs, eye drops, creams, and over-the-counter medicines. Any problems you or family members have had with anesthesia. Any bleeding problems you have. Any surgeries you've had. Any medical conditions you have. Whether you're pregnant or may be pregnant. What are the risks? Your health care provider will talk with you about risks. These may include: Bleeding. A hole or tear in your esophagus. What happens before the procedure? When to stop eating and drinking Follow instructions from your provider about what you may eat and drink. These may include: 8 hours before your procedure Stop eating most foods.  Do not eat meat, fried foods, or fatty foods. Eat only light foods, such as toast or crackers. All liquids are okay except energy drinks and alcohol. 6 hours before your procedure Stop eating. Drink only clear liquids, such as water, clear fruit juice, black coffee, plain tea, and sports drinks. Do not drink energy drinks or alcohol. 2 hours before your procedure Stop drinking all liquids. You may be allowed to take medicines with small sips of water. If you don't follow your provider's instructions, your procedure may be delayed or canceled. Medicines Ask your provider about: Changing or stopping your regular medicines. These include any diabetes medicines or blood thinners you take. Taking medicines such as aspirin and ibuprofen. These medicines can thin your blood. Do not take them unless your provider tells you to. Taking over-the-counter medicines, vitamins, herbs, and supplements. General instructions If you'll be going home right after the procedure, plan to have a  responsible adult: Take you home from the hospital or clinic. You won't be allowed to drive. Care for you for the time you're told. What happens during the procedure? You may be given: A sedative. This helps you relax. Anesthesia. This keeps you from feeling pain. It will numb certain areas of your body. The stretching may be done with: Simple dilators. These are tools put in your esophagus to stretch it. Guide wires. These wires are put in using a tube called an endoscope. A dilator is put over the wires to stretch your esophagus. Then the wires are taken out. A balloon. The balloon is on the end of a tube. It's inflated to stretch your esophagus. The procedure may vary among providers and hospitals. What can I expect after the procedure? Your blood pressure, heart rate, breathing rate, and blood oxygen level will be monitored until you leave the hospital or clinic. Your throat may feel sore and numb. This will get  better over time. You won't be allowed to eat or drink until your throat is no longer numb. You may be able to go home when you can: Drink. Pee. Sit on the edge of the bed without nausea or dizziness. Follow these instructions at home: Activity If you were given a sedative during the procedure, it can affect you for several hours. Do not drive or operate machinery until your provider says it's safe. Return to your normal activities as told by your provider. Ask your provider what activities are safe for you. General instructions Take over-the-counter and prescription medicines only as told by your provider. Follow instructions from your provider about what you may eat and drink. Do not use any products that contain nicotine or tobacco. These products include cigarettes, chewing tobacco, and vaping devices, such as e-cigarettes. If you need help quitting, ask your provider. Keep all follow-up visits. Your provider will make sure the procedure worked. Where to find more information American Society for Gastrointestinal Endoscopy (ASGE): asge.org Contact a health care provider if: You have trouble swallowing. You have a fever. Your pain doesn't get better with medicine. Get help right away if: You have chest pain. You have trouble breathing. You vomit blood. Your poop is: Black. Tarry. Bloody. These symptoms may be an emergency. Get help right away. Call 911. Do not wait to see if the symptoms will go away. Do not drive yourself to the hospital. This information is not intended to replace advice given to you by your health care provider. Make sure you discuss any questions you have with your health care provider. Document Revised: 07/19/2022 Document Reviewed: 07/19/2022 Elsevier Patient Education  2024 [employer].Colonoscopy, Adult, Care After The following information offers guidance on how to care for yourself after your procedure. Your health care provider may also give you  more specific instructions. If you have problems or questions, contact your health care provider. What can I expect after the procedure? After the procedure, it is common to have: A small amount of blood in your stool for 24 hours after the procedure. Some gas. Mild cramping or bloating of your abdomen. Follow these instructions at home: Eating and drinking  Drink enough fluid to keep your urine pale yellow. Follow instructions from your health care provider about eating or drinking restrictions. Resume your normal diet as told by your health care provider. Avoid heavy or fried foods that are hard to digest. Activity Rest as told by your health care provider. Avoid sitting for a long time without moving.  Get up to take short walks every 1-2 hours. This is important to improve blood flow and breathing. Ask for help if you feel weak or unsteady. Return to your normal activities as told by your health care provider. Ask your health care provider what activities are safe for you. Managing cramping and bloating  Try walking around when you have cramps or feel bloated. If directed, apply heat to your abdomen as told by your health care provider. Use the heat source that your health care provider recommends, such as a moist heat pack or a heating pad. Place a towel between your skin and the heat source. Leave the heat on for 20-30 minutes. Remove the heat if your skin turns bright red. This is especially important if you are unable to feel pain, heat, or cold. You have a greater risk of getting burned. General instructions If you were given a sedative during the procedure, it can affect you for several hours. Do not drive or operate machinery until your health care provider says that it is safe. For the first 24 hours after the procedure: Do not sign important documents. Do not drink alcohol. Do your regular daily activities at a slower pace than normal. Eat soft foods that are easy to  digest. Take over-the-counter and prescription medicines only as told by your health care provider. Keep all follow-up visits. This is important. Contact a health care provider if: You have blood in your stool 2-3 days after the procedure. Get help right away if: You have more than a small spotting of blood in your stool. You have large blood clots in your stool. You have swelling of your abdomen. You have nausea or vomiting. You have a fever. You have increasing pain in your abdomen that is not relieved with medicine. These symptoms may be an emergency. Get help right away. Call 911. Do not wait to see if the symptoms will go away. Do not drive yourself to the hospital. Summary After the procedure, it is common to have a small amount of blood in your stool. You may also have mild cramping and bloating of your abdomen. If you were given a sedative during the procedure, it can affect you for several hours. Do not drive or operate machinery until your health care provider says that it is safe. Get help right away if you have a lot of blood in your stool, nausea or vomiting, a fever, or increased pain in your abdomen. This information is not intended to replace advice given to you by your health care provider. Make sure you discuss any questions you have with your health care provider. Document Revised: 06/04/2022 Document Reviewed: 12/13/2020 Elsevier Patient Education  2024 Else[employer] Inc.Monitored Anesthesia Care, Care After The following information offers guidance on how to care for yourself after your procedure. Your health care provider may also give you more specific instructions. If you have problems or questions, contact your health care provider. What can I expect after the procedure? After the procedure, it is common to have: Tiredness. Little or no memory about what happened during or after the procedure. Impaired judgment when it comes to making decisions. Nausea or  vomiting. Some trouble with balance. Follow these instructions at home: For the time period you were told by your health care provider:  Rest. Do not participate in activities where you could fall or become injured. Do not drive or use machinery. Do not drink alcohol. Do not take sleeping pills or medicines that cause drowsiness.  Do not make important decisions or sign legal documents. Do not take care of children on your own. Medicines Take over-the-counter and prescription medicines only as told by your health care provider. If you were prescribed antibiotics, take them as told by your health care provider. Do not stop using the antibiotic even if you start to feel better. Eating and drinking Follow instructions from your health care provider about what you may eat and drink. Drink enough fluid to keep your urine pale yellow. If you vomit: Drink clear fluids slowly and in small amounts as you are able. Clear fluids include water, ice chips, low-calorie sports drinks, and fruit juice that has water added to it (diluted fruit juice). Eat light and bland foods in small amounts as you are able. These foods include bananas, applesauce, rice, lean meats, toast, and crackers. General instructions  Have a responsible adult stay with you for the time you are told. It is important to have someone help care for you until you are awake and alert. If you have sleep apnea, surgery and some medicines can increase your risk for breathing problems. Follow instructions from your health care provider about wearing your sleep device: When you are sleeping. This includes during daytime naps. While taking prescription pain medicines, sleeping medicines, or medicines that make you drowsy. Do not use any products that contain nicotine or tobacco. These products include cigarettes, chewing tobacco, and vaping devices, such as e-cigarettes. If you need help quitting, ask your health care provider. Contact a  health care provider if: You feel nauseous or vomit every time you eat or drink. You feel light-headed. You are still sleepy or having trouble with balance after 24 hours. You get a rash. You have a fever. You have redness or swelling around the IV site. Get help right away if: You have trouble breathing. You have new confusion after you get home. These symptoms may be an emergency. Get help right away. Call 911. Do not wait to see if the symptoms will go away. Do not drive yourself to the hospital. This information is not intended to replace advice given to you by your health care provider. Make sure you discuss any questions you have with your health care provider. Document Revised: 09/17/2021 Document Reviewed: 09/17/2021 Elsevier Patient Education  2024 [employer].

## 2023-06-19 ENCOUNTER — Encounter (HOSPITAL_COMMUNITY)
Admission: RE | Admit: 2023-06-19 | Discharge: 2023-06-19 | Disposition: A | Discharge status: Home / Self Care | Payer: 59 | Source: Ambulatory Visit | Attending: Internal Medicine | Admitting: Internal Medicine

## 2023-06-19 ENCOUNTER — Other Ambulatory Visit: Payer: Self-pay

## 2023-06-19 ENCOUNTER — Encounter (HOSPITAL_COMMUNITY): Payer: Self-pay

## 2023-06-19 VITALS — Ht 60.0 in | Wt 100.4 lb

## 2023-06-19 DIAGNOSIS — Z01810 Encounter for preprocedural cardiovascular examination: Secondary | ICD-10-CM | POA: Insufficient documentation

## 2023-06-19 DIAGNOSIS — I10 Essential (primary) hypertension: Secondary | ICD-10-CM | POA: Insufficient documentation

## 2023-06-23 ENCOUNTER — Encounter (HOSPITAL_COMMUNITY): Payer: Self-pay | Admitting: Internal Medicine

## 2023-06-23 ENCOUNTER — Ambulatory Visit (HOSPITAL_COMMUNITY): Payer: 59 | Admitting: Anesthesiology

## 2023-06-23 ENCOUNTER — Ambulatory Visit (HOSPITAL_COMMUNITY)
Admission: RE | Admit: 2023-06-23 | Discharge: 2023-06-23 | Disposition: A | Discharge status: Home / Self Care | Payer: 59 | Attending: Internal Medicine | Admitting: Internal Medicine

## 2023-06-23 ENCOUNTER — Other Ambulatory Visit: Payer: Self-pay

## 2023-06-23 ENCOUNTER — Encounter (HOSPITAL_COMMUNITY)
Admission: RE | Disposition: A | Discharge status: Home / Self Care | Payer: Self-pay | Source: Home / Self Care | Attending: Internal Medicine

## 2023-06-23 DIAGNOSIS — K222 Esophageal obstruction: Secondary | ICD-10-CM | POA: Insufficient documentation

## 2023-06-23 DIAGNOSIS — K2100 Gastro-esophageal reflux disease with esophagitis, without bleeding: Secondary | ICD-10-CM | POA: Insufficient documentation

## 2023-06-23 DIAGNOSIS — K31A11 Gastric intestinal metaplasia without dysplasia, involving the antrum: Secondary | ICD-10-CM | POA: Insufficient documentation

## 2023-06-23 DIAGNOSIS — Z980 Intestinal bypass and anastomosis status: Secondary | ICD-10-CM | POA: Insufficient documentation

## 2023-06-23 DIAGNOSIS — K648 Other hemorrhoids: Secondary | ICD-10-CM | POA: Diagnosis not present

## 2023-06-23 DIAGNOSIS — K760 Fatty (change of) liver, not elsewhere classified: Secondary | ICD-10-CM | POA: Insufficient documentation

## 2023-06-23 DIAGNOSIS — R1314 Dysphagia, pharyngoesophageal phase: Secondary | ICD-10-CM | POA: Diagnosis not present

## 2023-06-23 DIAGNOSIS — K259 Gastric ulcer, unspecified as acute or chronic, without hemorrhage or perforation: Secondary | ICD-10-CM | POA: Diagnosis not present

## 2023-06-23 DIAGNOSIS — K449 Diaphragmatic hernia without obstruction or gangrene: Secondary | ICD-10-CM | POA: Insufficient documentation

## 2023-06-23 DIAGNOSIS — K5730 Diverticulosis of large intestine without perforation or abscess without bleeding: Secondary | ICD-10-CM | POA: Diagnosis not present

## 2023-06-23 DIAGNOSIS — K5000 Crohn's disease of small intestine without complications: Secondary | ICD-10-CM | POA: Insufficient documentation

## 2023-06-23 DIAGNOSIS — F17210 Nicotine dependence, cigarettes, uncomplicated: Secondary | ICD-10-CM | POA: Insufficient documentation

## 2023-06-23 DIAGNOSIS — Z860101 Personal history of adenomatous and serrated colon polyps: Secondary | ICD-10-CM | POA: Insufficient documentation

## 2023-06-23 DIAGNOSIS — I10 Essential (primary) hypertension: Secondary | ICD-10-CM

## 2023-06-23 DIAGNOSIS — K649 Unspecified hemorrhoids: Secondary | ICD-10-CM | POA: Diagnosis not present

## 2023-06-23 DIAGNOSIS — Z9049 Acquired absence of other specified parts of digestive tract: Secondary | ICD-10-CM | POA: Diagnosis not present

## 2023-06-23 DIAGNOSIS — J4489 Other specified chronic obstructive pulmonary disease: Secondary | ICD-10-CM | POA: Insufficient documentation

## 2023-06-23 DIAGNOSIS — K2950 Unspecified chronic gastritis without bleeding: Secondary | ICD-10-CM | POA: Insufficient documentation

## 2023-06-23 DIAGNOSIS — G4730 Sleep apnea, unspecified: Secondary | ICD-10-CM | POA: Insufficient documentation

## 2023-06-23 DIAGNOSIS — J45909 Unspecified asthma, uncomplicated: Secondary | ICD-10-CM | POA: Diagnosis not present

## 2023-06-23 DIAGNOSIS — R7989 Other specified abnormal findings of blood chemistry: Secondary | ICD-10-CM | POA: Diagnosis not present

## 2023-06-23 DIAGNOSIS — K2970 Gastritis, unspecified, without bleeding: Secondary | ICD-10-CM | POA: Diagnosis not present

## 2023-06-23 DIAGNOSIS — Z8379 Family history of other diseases of the digestive system: Secondary | ICD-10-CM | POA: Insufficient documentation

## 2023-06-23 DIAGNOSIS — J449 Chronic obstructive pulmonary disease, unspecified: Secondary | ICD-10-CM | POA: Diagnosis not present

## 2023-06-23 DIAGNOSIS — K31A19 Gastric intestinal metaplasia without dysplasia, unspecified site: Secondary | ICD-10-CM

## 2023-06-23 HISTORY — PX: ESOPHAGEAL DILATION: SHX303

## 2023-06-23 HISTORY — PX: BIOPSY: SHX5522

## 2023-06-23 HISTORY — PX: ESOPHAGOGASTRODUODENOSCOPY (EGD) WITH PROPOFOL: SHX5813

## 2023-06-23 HISTORY — PX: COLONOSCOPY WITH PROPOFOL: SHX5780

## 2023-06-23 SURGERY — COLONOSCOPY WITH PROPOFOL
Anesthesia: General

## 2023-06-23 MED ORDER — LACTATED RINGERS IV SOLN: INTRAVENOUS | Status: DC | PRN

## 2023-06-23 MED ORDER — LIDOCAINE HCL (CARDIAC) PF 100 MG/5ML IV SOSY: PREFILLED_SYRINGE | INTRAVENOUS | Status: DC | PRN

## 2023-06-23 MED ORDER — LIDOCAINE HCL (CARDIAC) PF 100 MG/5ML IV SOSY
PREFILLED_SYRINGE | INTRAVENOUS | Status: DC | PRN
Start: 2023-06-23 — End: 2023-06-23
  Administered 2023-06-23: 60 mg via INTRAVENOUS

## 2023-06-23 MED ORDER — PROPOFOL 10 MG/ML IV BOLUS
INTRAVENOUS | Status: DC | PRN
  Administered 2023-06-23 (×2): 40 mg via INTRAVENOUS

## 2023-06-23 MED ORDER — PROPOFOL 500 MG/50ML IV EMUL
INTRAVENOUS | Status: DC | PRN
  Administered 2023-06-23: 100 ug/kg/min via INTRAVENOUS

## 2023-06-23 MED ORDER — PHENYLEPHRINE HCL (PRESSORS) 10 MG/ML IV SOLN
INTRAVENOUS | Status: DC | PRN
  Administered 2023-06-23 (×3): 80 ug via INTRAVENOUS

## 2023-06-23 NOTE — Transfer of Care (Signed)
Immediate Anesthesia Transfer of Care Note  Patient: [name] [name]  Procedure(s) Performed: COLONOSCOPY WITH PROPOFOL ESOPHAGOGASTRODUODENOSCOPY (EGD) WITH PROPOFOL ESOPHAGEAL DILATION BIOPSY  Patient Location: PACU  Anesthesia Type:MAC  Level of Consciousness: awake, alert , and oriented  Airway & Oxygen Therapy: Patient Spontanous Breathing and Patient connected to nasal cannula oxygen  Post-op Assessment: Report given to RN and Post -op Vital signs reviewed and stable  Post vital signs: Reviewed and stable  Last Vitals:  Vitals Value Taken Time  BP 93/63 06/23/23 1107  Temp 36.7 C 06/23/23 1107  Pulse 80 06/23/23 1107  Resp 18 06/23/23 1107  SpO2 100 % 06/23/23 1107    Last Pain:  Vitals:   06/23/23 1107  TempSrc: Oral  PainSc: 0-No pain         Complications: No notable events documented.

## 2023-06-23 NOTE — Discharge Instructions (Addendum)
EGD Discharge instructions Please read the instructions outlined below and refer to this sheet in the next few weeks. These discharge instructions provide you with general information on caring for yourself after you leave the hospital. Your doctor may also give you specific instructions. While your treatment has been planned according to the most current medical practices available, unavoidable complications occasionally occur. If you have any problems or questions after discharge, please call your doctor. ACTIVITY You may resume your regular activity but move at a slower pace for the next 24 hours.  Take frequent rest periods for the next 24 hours.  Walking will help expel (get rid of) the air and reduce the bloated feeling in your abdomen.  No driving for 24 hours (because of the anesthesia (medicine) used during the test).  You may shower.  Do not sign any important legal documents or operate any machinery for 24 hours (because of the anesthesia used during the test).  NUTRITION Drink plenty of fluids.  You may resume your normal diet.  Begin with a light meal and progress to your normal diet.  Avoid alcoholic beverages for 24 hours or as instructed by your caregiver.  MEDICATIONS You may resume your normal medications unless your caregiver tells you otherwise.  WHAT YOU CAN EXPECT TODAY You may experience abdominal discomfort such as a feeling of fullness or "gas" pains.  FOLLOW-UP Your doctor will discuss the results of your test with you.  SEEK IMMEDIATE MEDICAL ATTENTION IF ANY OF THE FOLLOWING OCCUR: Excessive nausea (feeling sick to your stomach) and/or vomiting.  Severe abdominal pain and distention (swelling).  Trouble swallowing.  Temperature over 101 F (37.8 C).  Rectal bleeding or vomiting of blood.     Colonoscopy Discharge Instructions  Read the instructions outlined below and refer to this sheet in the next few weeks. These discharge instructions provide you with  general information on caring for yourself after you leave the hospital. Your doctor may also give you specific instructions. While your treatment has been planned according to the most current medical practices available, unavoidable complications occasionally occur.   ACTIVITY You may resume your regular activity, but move at a slower pace for the next 24 hours.  Take frequent rest periods for the next 24 hours.  Walking will help get rid of the air and reduce the bloated feeling in your belly (abdomen).  No driving for 24 hours (because of the medicine (anesthesia) used during the test).   Do not sign any important legal documents or operate any machinery for 24 hours (because of the anesthesia used during the test).  NUTRITION Drink plenty of fluids.  You may resume your normal diet as instructed by your doctor.  Begin with a light meal and progress to your normal diet. Heavy or fried foods are harder to digest and may make you feel sick to your stomach (nauseated).  Avoid alcoholic beverages for 24 hours or as instructed.  MEDICATIONS You may resume your normal medications unless your doctor tells you otherwise.  WHAT YOU CAN EXPECT TODAY Some feelings of bloating in the abdomen.  Passage of more gas than usual.  Spotting of blood in your stool or on the toilet paper.  IF YOU HAD POLYPS REMOVED DURING THE COLONOSCOPY: No aspirin products for 7 days or as instructed.  No alcohol for 7 days or as instructed.  Eat a soft diet for the next 24 hours.  FINDING OUT THE RESULTS OF YOUR TEST Not all test results are  available during your visit. If your test results are not back during the visit, make an appointment with your caregiver to find out the results. Do not assume everything is normal if you have not heard from your caregiver or the medical facility. It is important for you to follow up on all of your test results.  SEEK IMMEDIATE MEDICAL ATTENTION IF: You have more than a spotting of  blood in your stool.  Your belly is swollen (abdominal distention).  You are nauseated or vomiting.  You have a temperature over 101.  You have abdominal pain or discomfort that is severe or gets worse throughout the day.   Your EGD revealed mild amount inflammation in your stomach.  I took biopsies of this to rule out infection with a bacteria called H. pylori.  Await pathology results, my office will contact you.  You also had 1 small ulcer in your stomach.    You had a tightening of your esophagus called a Schatzki's ring.  I stretched this out today.  Hopefully this helps with feeling of food getting stuck.  Continue on Dexilant daily.  Avoid NSAIDs.    We likely need to repeat upper endoscopy in approximately 8 to 12 weeks to evaluate healing as well as to redilate as needed.  Your colonoscopy was relatively unremarkable.  I did not find any polyps or evidence of colon cancer.  I recommend repeating colonoscopy in 5 years for surveillance purposes.  Overall, your colon appeared very healthy.  I did not see any active inflammation indicative of active Crohn's disease.  Follow up in GI office in 4-6 weeks  I hope you have a great rest of your week!  Hennie Duos. Marletta Lor, D.O. Gastroenterology and Hepatology [facility]

## 2023-06-23 NOTE — Op Note (Signed)
[facility] Patient Name: [name] [name] Procedure Date: 06/23/2023 10:18 AM MRN: [id number] Date of Birth: January 31, 1956 Attending MD: Hennie Duos. Marletta Lor , [place], [id number] CSN: [id number] Age: 68 Admit Type: Outpatient Procedure:                Upper GI endoscopy Indications:              Dysphagia, Heartburn Providers:                Hennie Duos. Marletta Lor, DO, Angelica Ran, Dyann Ruddle Referring MD:              Medicines:                See the Anesthesia note for documentation of the                            administered medications Complications:            No immediate complications. Estimated Blood Loss:     Estimated blood loss was minimal. Procedure:                Pre-Anesthesia Assessment:                           - The anesthesia plan was to use monitored                            anesthesia care (MAC).                           After obtaining informed consent, the endoscope was                            passed under direct vision. Throughout the                            procedure, the patient's blood pressure, pulse, and                            oxygen saturations were monitored continuously. The                            [id number]-H190 ([id number]) scope was introduced through the                            mouth, and advanced to the second part of duodenum.                            The upper GI endoscopy was accomplished without                            difficulty. The patient tolerated the procedure                            well. Scope In: 10:38:48 AM Scope Out: 10:43:21 AM Total Procedure Duration: 0 hours 4 minutes 33 seconds  Findings:      A small hiatal hernia was present.  A mild Schatzki ring was found in the distal esophagus. A TTS dilator       was passed through the scope. Dilation with an 18-19-20 mm balloon       dilator was performed to 18 mm. The dilation site was examined and       showed mild mucosal disruption and moderate improvement in  luminal       narrowing.      LA Grade A (one or more mucosal breaks less than 5 mm, not extending       between tops of 2 mucosal folds) esophagitis with no bleeding was found       in the lower third of the esophagus.      Diffuse moderate inflammation characterized by congestion (edema),       erosions and erythema was found in the gastric body and in the gastric       antrum. Biopsies were taken with a cold forceps for Helicobacter pylori       testing.      One non-bleeding linear gastric ulcer with no stigmata of bleeding was       found in the gastric body. The lesion was 5 mm in largest dimension.      The duodenal bulb, first portion of the duodenum and second portion of       the duodenum were normal. Impression:               - Small hiatal hernia.                           - Mild Schatzki ring. Dilated.                           - LA Grade A reflux esophagitis with no bleeding.                           - Gastritis. Biopsied.                           - Non-bleeding gastric ulcer with no stigmata of                            bleeding.                           - Normal duodenal bulb, first portion of the                            duodenum and second portion of the duodenum. Moderate Sedation:      Per Anesthesia Care Recommendation:           - Patient has a contact number available for                            emergencies. The signs and symptoms of potential                            delayed complications were discussed with the  patient. Return to normal activities tomorrow.                            Written discharge instructions were provided to the                            patient.                           - Resume previous diet.                           - Continue present medications.                           - Await pathology results.                           - Repeat upper endoscopy in 8-12 weeks to evaluate                             the response to therapy and for retreatment.                           - Return to GI clinic in 6 weeks.                           - Use Dexilant (dexlansoprazole) 60 mg PO daily.                           - No ibuprofen, naproxen, or other non-steroidal                            anti-inflammatory drugs. Procedure Code(s):        --- Professional ---                           [phone], Esophagogastroduodenoscopy, flexible,                            transoral; with transendoscopic balloon dilation of                            esophagus (less than 30 mm diameter)                           43239, 59, Esophagogastroduodenoscopy, flexible,                            transoral; with biopsy, single or multiple Diagnosis Code(s):        --- Professional ---                           K44.9, Diaphragmatic hernia without obstruction or                            gangrene  K22.2, Esophageal obstruction                           K21.00, Gastro-esophageal reflux disease with                            esophagitis, without bleeding                           K29.70, Gastritis, unspecified, without bleeding                           K25.9, Gastric ulcer, unspecified as acute or                            chronic, without hemorrhage or perforation                           R13.10, Dysphagia, unspecified                           R12, Heartburn CPT copyright 2022 American Medical Association. All rights reserved. The codes documented in this report are preliminary and upon coder review may  be revised to meet current compliance requirements. Hennie Duos. Marletta Lor, DO Hennie Duos. Marletta Lor, DO 06/23/2023 11:06:25 AM This report has been signed electronically. Number of Addenda: 0

## 2023-06-23 NOTE — Interval H&P Note (Signed)
History and Physical Interval Note:  06/23/2023 9:45 AM  [name] [name]  has presented today for surgery, with the diagnosis of history of polyps, dysphagia.  The various methods of treatment have been discussed with the patient and family. After consideration of risks, benefits and other options for treatment, the patient has consented to  Procedure(s) with comments: COLONOSCOPY WITH PROPOFOL (N/A) - 10:00 am, asa 3 ESOPHAGOGASTRODUODENOSCOPY (EGD) WITH PROPOFOL (N/A) ESOPHAGEAL DILATION (N/A) as a surgical intervention.  The patient's history has been reviewed, patient examined, no change in status, stable for surgery.  I have reviewed the patient's chart and labs.  Questions were answered to the patient's satisfaction.     Lanelle Bal

## 2023-06-23 NOTE — Op Note (Signed)
[facility] Patient Name: [name] [name] Procedure Date: 06/23/2023 10:17 AM MRN: [id number] Date of Birth: 1955/12/02 Attending MD: Hennie Duos. Marletta Lor , [place], [id number] CSN: [id number] Age: 68 Admit Type: Outpatient Procedure:                Colonoscopy Indications:              Assess therapeutic response to therapy of Crohn's                            disease of the small bowel Providers:                Hennie Duos. Marletta Lor, DO, Angelica Ran, Dyann Ruddle Referring MD:              Medicines:                See the Anesthesia note for documentation of the                            administered medications Complications:             Estimated Blood Loss:     Estimated blood loss: none. Procedure:                Pre-Anesthesia Assessment:                           - The anesthesia plan was to use monitored                            anesthesia care (MAC).                           After obtaining informed consent, the colonoscope                            was passed under direct vision. Throughout the                            procedure, the patient's blood pressure, pulse, and                            oxygen saturations were monitored continuously. The                            PCF-HQ190L ([id number]) scope was introduced through                            the anus and advanced to the the ileocolonic                            anastomosis. The colonoscopy was performed without                            difficulty. The patient tolerated the procedure                            well. The quality of the  bowel preparation was                            evaluated using the BBPS [facility] Bowel Preparation                            Scale) with scores of: Right Colon = 3, Transverse                            Colon = 3 and Left Colon = 3 (entire mucosa seen                            well with no residual staining, small fragments of                            stool or opaque liquid). The  total BBPS score                            equals 9. Scope In: 10:46:20 AM Scope Out: 10:59:29 AM Scope Withdrawal Time: 0 hours 7 minutes 41 seconds  Total Procedure Duration: 0 hours 13 minutes 9 seconds  Findings:      Non-bleeding internal hemorrhoids were found during endoscopy.      Multiple large-mouthed and small-mouthed diverticula were found in the       sigmoid colon and descending colon.      There was evidence of a prior side-to-side ileo-colonic anastomosis in       the ascending colon. This was patent and was characterized by healthy       appearing mucosa. The anastomosis was traversed.      The entire examined colon appeared normal. Impression:               - Non-bleeding internal hemorrhoids.                           - Diverticulosis in the sigmoid colon and in the                            descending colon.                           - Patent side-to-side ileo-colonic anastomosis,                            characterized by healthy appearing mucosa.                           - The entire examined colon is normal.                           - No specimens collected. Moderate Sedation:      Per Anesthesia Care Recommendation:           - Patient has a contact number available for                            emergencies. The signs  and symptoms of potential                            delayed complications were discussed with the                            patient. Return to normal activities tomorrow.                            Written discharge instructions were provided to the                            patient.                           - Resume previous diet.                           - Continue present medications.                           - Repeat colonoscopy in 5 years for surveillance.                           - Return to GI clinic in 6 weeks. Procedure Code(s):        --- Professional ---                           [phone], Colonoscopy, flexible; diagnostic,  including                            collection of specimen(s) by brushing or washing,                            when performed (separate procedure) Diagnosis Code(s):        --- Professional ---                           K64.8, Other hemorrhoids                           Z98.0, Intestinal bypass and anastomosis status                           K50.00, Crohn's disease of small intestine without                            complications                           K57.30, Diverticulosis of large intestine without                            perforation or abscess without bleeding CPT copyright 2022 American Medical Association. All rights reserved. The codes documented in this report are preliminary and upon coder review may  be revised to meet current compliance requirements. Hennie Duos. Marletta Lor,  DO Hennie Duos. Marletta Lor, DO 06/23/2023 11:08:32 AM This report has been signed electronically. Number of Addenda: 0

## 2023-06-23 NOTE — Anesthesia Preprocedure Evaluation (Signed)
Anesthesia Evaluation  Patient identified by MRN, date of birth, ID band Patient awake    Reviewed: Allergy & Precautions, H&P , NPO status , Patient's Chart, lab work & pertinent test results, reviewed documented beta blocker date and time   History of Anesthesia Complications (+) PONV and history of anesthetic complications  Airway Mallampati: II  TM Distance: >3 FB Neck ROM: full    Dental no notable dental hx.    Pulmonary neg pulmonary ROS, shortness of breath, asthma , sleep apnea , COPD, Current Smoker   Pulmonary exam normal breath sounds clear to auscultation       Cardiovascular Exercise Tolerance: Good hypertension, negative cardio ROS  Rhythm:regular Rate:Normal     Neuro/Psych  Headaches PSYCHIATRIC DISORDERS Anxiety Depression     Neuromuscular disease negative neurological ROS  negative psych ROS   GI/Hepatic negative GI ROS, Neg liver ROS,GERD  ,,  Endo/Other  negative endocrine ROSHypothyroidism    Renal/GU negative Renal ROS  negative genitourinary   Musculoskeletal   Abdominal   Peds  Hematology negative hematology ROS (+)   Anesthesia Other Findings   Reproductive/Obstetrics negative OB ROS                             Anesthesia Physical Anesthesia Plan  ASA: 3  Anesthesia Plan: General   Post-op Pain Management:    Induction:   PONV Risk Score and Plan: Propofol infusion  Airway Management Planned:   Additional Equipment:   Intra-op Plan:   Post-operative Plan:   Informed Consent: I have reviewed the patients History and Physical, chart, labs and discussed the procedure including the risks, benefits and alternatives for the proposed anesthesia with the patient or authorized representative who has indicated his/her understanding and acceptance.     Dental Advisory Given  Plan Discussed with: CRNA  Anesthesia Plan Comments:        Anesthesia  Quick Evaluation

## 2023-06-24 ENCOUNTER — Encounter (HOSPITAL_COMMUNITY): Payer: Self-pay | Admitting: Internal Medicine

## 2023-06-25 LAB — SURGICAL PATHOLOGY

## 2023-06-27 NOTE — Anesthesia Postprocedure Evaluation (Signed)
Anesthesia Post Note  Patient: Imajean B Grondahl  Procedure(s) Performed: COLONOSCOPY WITH PROPOFOL ESOPHAGOGASTRODUODENOSCOPY (EGD) WITH PROPOFOL ESOPHAGEAL DILATION BIOPSY  Patient location during evaluation: Phase II Anesthesia Type: General Level of consciousness: awake Pain management: pain level controlled Vital Signs Assessment: post-procedure vital signs reviewed and stable Respiratory status: spontaneous breathing and respiratory function stable Cardiovascular status: blood pressure returned to baseline and stable Postop Assessment: no headache and no apparent nausea or vomiting Anesthetic complications: no Comments: Late entry   No notable events documented.   Last Vitals:  Vitals:   06/23/23 0945 06/23/23 1107  BP: 111/78 93/63  Pulse: 85 80  Resp: (!) 21 18  Temp: 36.8 C 36.7 C  SpO2: 96% 100%    Last Pain:  Vitals:   06/24/23 1108  TempSrc:   PainSc: 0-No pain                 Windell Norfolk

## 2023-07-08 ENCOUNTER — Telehealth: Payer: Self-pay | Admitting: Internal Medicine

## 2023-07-08 DIAGNOSIS — J410 Simple chronic bronchitis: Secondary | ICD-10-CM

## 2023-07-08 DIAGNOSIS — J302 Other seasonal allergic rhinitis: Secondary | ICD-10-CM

## 2023-07-22 DIAGNOSIS — G894 Chronic pain syndrome: Secondary | ICD-10-CM | POA: Diagnosis not present

## 2023-07-22 DIAGNOSIS — M5442 Lumbago with sciatica, left side: Secondary | ICD-10-CM | POA: Diagnosis not present

## 2023-07-22 DIAGNOSIS — Z79891 Long term (current) use of opiate analgesic: Secondary | ICD-10-CM | POA: Diagnosis not present

## 2023-07-22 NOTE — Telephone Encounter (Signed)
 Patient asking return call ankle still swollen.

## 2023-07-22 NOTE — Telephone Encounter (Unsigned)
 Copied from CRM [id number]. Topic: Appointments - Scheduling Inquiry for Clinic >> Jul 22, 2023  3:25 PM Gildardo Pounds wrote: Reason for CRM: Patient was advised by Dr Durwin Nora to call if her ankle is still swollen. Callback number is [phone]

## 2023-07-24 NOTE — Telephone Encounter (Signed)
 Patient calling again has not heard anything wanting to speak with someone asap in regards to her foot not getting better wondering if it's being caused by her medications. Please advise Thanks

## 2023-07-28 NOTE — Telephone Encounter (Signed)
 Mailbox full

## 2023-07-29 ENCOUNTER — Ambulatory Visit (INDEPENDENT_AMBULATORY_CARE_PROVIDER_SITE_OTHER): Admitting: Internal Medicine

## 2023-07-29 ENCOUNTER — Encounter: Payer: Self-pay | Admitting: Internal Medicine

## 2023-07-29 VITALS — BP 124/72 | HR 72 | Ht 61.0 in | Wt 104.4 lb

## 2023-07-29 DIAGNOSIS — I10 Essential (primary) hypertension: Secondary | ICD-10-CM | POA: Diagnosis not present

## 2023-07-29 DIAGNOSIS — M25472 Effusion, left ankle: Secondary | ICD-10-CM

## 2023-07-29 DIAGNOSIS — M25471 Effusion, right ankle: Secondary | ICD-10-CM

## 2023-07-29 MED ORDER — HYDROCHLOROTHIAZIDE 25 MG PO TABS
25.0000 mg | ORAL_TABLET | Freq: Every day | ORAL | 3 refills | Status: DC
Start: 2023-07-29 — End: 2023-08-15

## 2023-07-29 NOTE — Patient Instructions (Signed)
 It was a pleasure to see you today.  Thank you for giving [place] the opportunity to be involved in your care.  Below is a brief recap of your visit and next steps.  We will plan to see you again on 4/10.  Summary Discontinue amlodipine and start hctz 25 mg daily Follow up as previously scheduled on 4/10.

## 2023-07-29 NOTE — Progress Notes (Signed)
   Acute Office Visit  Subjective:     Patient ID: [name] [name], female    DOB: 05/16/1955, 68 y.o.   MRN: [id number]  Chief Complaint  Patient presents with   Care Management    Pt. States ankles been swelling really bad, been having night sweats and having heart papillations.    [name] [name] presents today for an acute visit endorsing persistent bilateral ankle edema.  She was last evaluated by me on 2/4 at which time she endorsed similar symptoms.  We reviewed potential etiologies, including amlodipine vs drinking Pedialyte multiple times daily.  Ultimately, no medication changes were made.  She stopped drinking Pedialyte and was instructed to return to care if symptoms worsen or failed to improve.  In the interim, she underwent colonoscopy with Dr. Marletta Lor and has also been seen by podiatry (Dr. Nolen Mu).  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  Today she states that her edema is not as severe as it has been.  She endorses significant edema yesterday afternoon to the point that she could not put on her shoes.  Denies orthopnea/PND.  She endorses recent night sweats and heart palpitations.  ECG obtained 2/13 prior to colonoscopy showed normal sinus rhythm.  Review of Systems  Cardiovascular:  Positive for leg swelling (Bilateral ankle edema).     Objective:    BP 124/72 (BP Location: Left Arm, Patient Position: Sitting, Cuff Size: Normal)   Pulse 72   Ht 5\' 1"  (1.549 m)   Wt 104 lb 6.4 oz (47.4 kg)   SpO2 93%   BMI 19.73 kg/m  BP Readings from Last 3 Encounters:  07/29/23 124/72  06/23/23 93/63  06/10/23 117/76   Physical Exam Vitals reviewed.  Musculoskeletal:        General: Swelling (1+ bilateral ankle edema.  No overlying erythema or induration.) present. No tenderness.        Assessment & Plan:   Problem List Items Addressed This Visit       Ankle edema, bilateral   Presenting today for an acute visit for persistent bilateral ankle edema.  1+ bilateral ankle edema is present on exam again today..  She has stopped drinking Pedialyte but there has been no change in symptoms.  She is concerned that edema is a side effect of amlodipine.  I agree that her lower extremity edema is potentially an adverse effect of amlodipine, but could also reflect chronic venous insufficiency.  Treatment options were reviewed.  Amlodipine has been discontinued.  She will start HCTZ 25 mg daily.  She has previously been prescribed lisinopril and losartan and states that they did not adequately control her blood pressure.  I offered to repeat labs today based on additional symptoms that she endorsed.  She would like to defer repeat labs until her follow-up appointment on 4/10.       Meds ordered this encounter  Medications   hydrochlorothiazide (HYDRODIURIL) 25 MG tablet    Sig: Take 1 tablet (25 mg total) by mouth daily.    Dispense:  30 tablet    Refill:  3    Return in about 16 days (around 08/14/2023).  Billie Lade, MD

## 2023-07-29 NOTE — Assessment & Plan Note (Signed)
 Presenting today for an acute visit for persistent bilateral ankle edema.  1+ bilateral ankle edema is present on exam again today..  She has stopped drinking Pedialyte but there has been no change in symptoms.  She is concerned that edema is a side effect of amlodipine.  I agree that her lower extremity edema is potentially an adverse effect of amlodipine, but could also reflect chronic venous insufficiency.  Treatment options were reviewed.  Amlodipine has been discontinued.  She will start HCTZ 25 mg daily.  She has previously been prescribed lisinopril and losartan and states that they did not adequately control her blood pressure.  I offered to repeat labs today based on additional symptoms that she endorsed.  She would like to defer repeat labs until her follow-up appointment on 4/10.

## 2023-07-31 DIAGNOSIS — M79671 Pain in right foot: Secondary | ICD-10-CM | POA: Diagnosis not present

## 2023-07-31 DIAGNOSIS — R600 Localized edema: Secondary | ICD-10-CM | POA: Diagnosis not present

## 2023-07-31 DIAGNOSIS — M25571 Pain in right ankle and joints of right foot: Secondary | ICD-10-CM | POA: Diagnosis not present

## 2023-08-07 ENCOUNTER — Other Ambulatory Visit: Payer: Self-pay | Admitting: Internal Medicine

## 2023-08-07 DIAGNOSIS — J302 Other seasonal allergic rhinitis: Secondary | ICD-10-CM

## 2023-08-07 DIAGNOSIS — J410 Simple chronic bronchitis: Secondary | ICD-10-CM

## 2023-08-14 ENCOUNTER — Ambulatory Visit (INDEPENDENT_AMBULATORY_CARE_PROVIDER_SITE_OTHER): Payer: 59 | Admitting: Internal Medicine

## 2023-08-14 ENCOUNTER — Encounter: Payer: Self-pay | Admitting: Gastroenterology

## 2023-08-14 ENCOUNTER — Ambulatory Visit (INDEPENDENT_AMBULATORY_CARE_PROVIDER_SITE_OTHER): Payer: 59 | Admitting: Gastroenterology

## 2023-08-14 ENCOUNTER — Encounter: Payer: Self-pay | Admitting: Internal Medicine

## 2023-08-14 VITALS — BP 133/81 | HR 67 | Temp 98.1°F | Ht 60.0 in | Wt 104.2 lb

## 2023-08-14 VITALS — BP 135/69 | HR 76 | Ht 60.0 in | Wt 104.6 lb

## 2023-08-14 DIAGNOSIS — R232 Flushing: Secondary | ICD-10-CM | POA: Diagnosis not present

## 2023-08-14 DIAGNOSIS — R7989 Other specified abnormal findings of blood chemistry: Secondary | ICD-10-CM

## 2023-08-14 DIAGNOSIS — K824 Cholesterolosis of gallbladder: Secondary | ICD-10-CM

## 2023-08-14 DIAGNOSIS — M25472 Effusion, left ankle: Secondary | ICD-10-CM

## 2023-08-14 DIAGNOSIS — J301 Allergic rhinitis due to pollen: Secondary | ICD-10-CM | POA: Diagnosis not present

## 2023-08-14 DIAGNOSIS — K279 Peptic ulcer, site unspecified, unspecified as acute or chronic, without hemorrhage or perforation: Secondary | ICD-10-CM | POA: Diagnosis not present

## 2023-08-14 DIAGNOSIS — K219 Gastro-esophageal reflux disease without esophagitis: Secondary | ICD-10-CM | POA: Diagnosis not present

## 2023-08-14 DIAGNOSIS — E782 Mixed hyperlipidemia: Secondary | ICD-10-CM | POA: Diagnosis not present

## 2023-08-14 DIAGNOSIS — R109 Unspecified abdominal pain: Secondary | ICD-10-CM | POA: Diagnosis not present

## 2023-08-14 DIAGNOSIS — M810 Age-related osteoporosis without current pathological fracture: Secondary | ICD-10-CM

## 2023-08-14 DIAGNOSIS — M25471 Effusion, right ankle: Secondary | ICD-10-CM

## 2023-08-14 DIAGNOSIS — I10 Essential (primary) hypertension: Secondary | ICD-10-CM | POA: Diagnosis not present

## 2023-08-14 DIAGNOSIS — R61 Generalized hyperhidrosis: Secondary | ICD-10-CM

## 2023-08-14 NOTE — Progress Notes (Signed)
 Gastroenterology Office Note     Primary Care Physician:  Tobi Fortes, MD  Primary Gastroenterologist: Dr. Mordechai April   Chief Complaint   Chief Complaint  Patient presents with   Follow-up    States that she is needing another EGD     History of Present Illness   [name] [name] is a 68 y.o. female presenting today with a history of  Crohn's disease s/p ileocecectomy 2006 with surgical remission and no maintenance medications, hepatic steatosis with elastography noting low risk of fibrosis and known stable small gallbladder polyp, elevated LFTs, chronic GERD, dysphagia, recently undergoing EGD and colonoscopy as noted below.  EGD with findings of small hiatal hernia, Schatzki ring s/p dilation, LA Grade A reflux esophagitis, gastritis (negative H.pylori) and non-bleeding gastric ulcer. She needs surveillance.   Colonoscopy with non-bleeding internal hemorrhoids, normal colon, patent anastomosis, 5 year surveillance due to history of adenomas.   Today she notes chronic abdominal pain at baseline. Noted as "everywhere", intermittent, usually exacerbated by food choices. Ongoing for years. No weight loss. No food aversions. No food fear. Anything greasy or spicy causes abdominal pain. No romaine lettuce. Dexilant  once daily. No NSAIDs. No aspirin powders. Taking Dexilant  daily. She is in a hurry as she has another appointment upcoming.       Colonoscopy 04/21/2018 external/internal hemorrhoids, sigmoid diverticulosis, 2 polyps removed (tubular adenomas on pathology)   EGD 11/24/2018 with distal esophageal stenosis status post dilation up to 17 mm. Small hiatal hernia. Gastritis. Normal duodenum.   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   Crohn disease (HCC)    CTS (carpal tunnel  syndrome)    Diarrhea MULITIFACTORIAL   IBS, LACTOSE INTOLERANCE, SBBO, BILE-SALT   Elevated liver enzymes 2009: BMI 24 ?Etoh ALT 94, AST 51,  NEG ANA, qIGs& ASMA   MAY 2012 AST 52 ALT 38   Esophageal stricture 2009   GERD (gastroesophageal reflux disease)    Hemorrhoid    Hyperlipemia    Hypertension    Inflammatory bowel disease 2006 CD   SURGICAL REMISSION   LBP (low back pain)    Mental disorder    Migraine    Osteoporosis    PONV (postoperative nausea and vomiting)    Shortness of breath    exertion and humidity   Shoulder pain    right   Sleep apnea    Stop Bang Cock score of 4    Past Surgical History:  Procedure Laterality Date   BACK SURGERY     BACK SURGERY  07/16/2018   BILATERAL SALPINGOOPHORECTOMY     BIOPSY  12/24/2011   Procedure: BIOPSY;  Surgeon: Alyce Jubilee, MD;  Location: [facility];  Service: Endoscopy;;  Gastric Biopsies   BIOPSY N/A 06/30/2012   Procedure: BIOPSY;  Surgeon: Alyce Jubilee, MD;  Location: [facility];  Service: Endoscopy;  Laterality: N/A;  Gastric and Esophageal Biopsies   BIOPSY  06/23/2023   Procedure: BIOPSY;  Surgeon: Vinetta Greening, DO;  Location: [facility];  Service: Endoscopy;;   CARPAL TUNNEL RELEASE  LEFT   CATARACT EXTRACTION W/PHACO Right 07/30/2016   Procedure: CATARACT EXTRACTION PHACO AND INTRAOCULAR LENS PLACEMENT (IOC);  Surgeon: Albert Huff, MD;  Location: [facility];  Service: Ophthalmology;  Laterality: Right;  CDE: 5.45   CATARACT EXTRACTION W/PHACO Left 08/13/2016   Procedure: CATARACT EXTRACTION PHACO AND INTRAOCULAR LENS PLACEMENT (IOC);  Surgeon: Albert Huff, MD;  Location: [facility];  Service: Ophthalmology;  Laterality: Left;  CDE: 5.59   COLON SURGERY     COLONOSCOPY  JAN 2009 DIARRHEA, ABD PAIN, BRBPR   ANASTOMOTIC ULCER(3MM), IH WU:[id number] ULCER, NL COLON bX   COLONOSCOPY WITH PROPOFOL  N/A 04/21/2018   examined portion of the ileum normal, two 3-5 mm polyps, diverticulosis in the rectosigmoid and sigmoid colon,  external and internal hemorrhoids, significant looping of the colon.  Pathology with tubular adenomas.  Recommendations to follow high-fiber diet and repeat colonoscopy in 5 to 10 years with peds colonoscope.   COLONOSCOPY WITH PROPOFOL  N/A 06/23/2023   Procedure: COLONOSCOPY WITH PROPOFOL ;  Surgeon: Vinetta Greening, DO;  [facility]: [facility];  Service: Endoscopy;  Laterality: N/A;  10:00 am, asa 3   DILATION AND CURETTAGE OF UTERUS     ESOPHAGEAL DILATION  12/24/2011   SLF: A stricture was found in the distal esophagus/ Moderate gastritis/ MILD Duodenitis   ESOPHAGEAL DILATION N/A 06/23/2023   Procedure: ESOPHAGEAL DILATION;  Surgeon: Vinetta Greening, DO;  [facility]: [facility];  Service: Endoscopy;  Laterality: N/A;   ESOPHAGOGASTRODUODENOSCOPY  02/07/09   mild gastritis   ESOPHAGOGASTRODUODENOSCOPY (EGD) WITH PROPOFOL  N/A 06/30/2012   SLF: 1. No definite stricture appreciated 2. small hiatal hernia 3. Gastritis   ESOPHAGOGASTRODUODENOSCOPY (EGD) WITH PROPOFOL  N/A 02/20/2016   Procedure: ESOPHAGOGASTRODUODENOSCOPY (EGD) WITH PROPOFOL ;  Surgeon: Alyce Jubilee, MD;  [facility]: [facility];  Service: Endoscopy;  Laterality: N/A;  930   ESOPHAGOGASTRODUODENOSCOPY (EGD) WITH PROPOFOL  N/A 11/24/2018   benign-appearing esophageal peptic stricture s/p dilation, small hiatal hernia, moderate erosive gastritis.    ESOPHAGOGASTRODUODENOSCOPY (EGD) WITH PROPOFOL  N/A 06/23/2023   Procedure: ESOPHAGOGASTRODUODENOSCOPY (EGD) WITH PROPOFOL ;  Surgeon: Vinetta Greening, DO;  [facility]: [facility];  Service: Endoscopy;  Laterality: N/A;   EYE SURGERY     gum flap Bilateral    HEMICOLECTOMY  RIGHT 2006   HERNIA REPAIR     LUMBAR DISC SURGERY     POLYPECTOMY  04/21/2018   Procedure: POLYPECTOMY;  Surgeon: Alyce Jubilee, MD;  [facility]: [facility];  Service: Endoscopy;;  (colon)   SAVORY DILATION N/A 06/30/2012   Procedure: SAVORY DILATION;  Surgeon: Alyce Jubilee, MD;  Location: [facility];  Service:  Endoscopy;  Laterality: N/A;  12.92mm , 14mm, 15mm   SAVORY DILATION N/A 02/20/2016   Procedure: SAVORY DILATION;  Surgeon: Alyce Jubilee, MD;  [facility]: [facility];  Service: Endoscopy;  Laterality: N/A;   SAVORY DILATION N/A 11/24/2018   Procedure: SAVORY DILATION;  Surgeon: Alyce Jubilee, MD;  [facility];  Service: Endoscopy;  Laterality: N/A;   UPPER GASTROINTESTINAL ENDOSCOPY  JAN 2009 ABD PAIN   GASTRITIS, ESO RING   UPPER GASTROINTESTINAL ENDOSCOPY  OCT 2010 ABD PAIN, DYSPHAGIA   DIL , GASTRITIS, NL DUODENUM   UPPER GASTROINTESTINAL ENDOSCOPY  OCT 2010 DYSPHAGIA   DIL 17 MM, GASTRITIS, NL DUODENUM   vocal cord surgery  Sep 20, 2010   precancerous areas removed   wisdom tooth extraction Right    pin placement due to broken jaw    Current Outpatient Medications  Medication Sig Dispense Refill   benzonatate  (TESSALON ) 100 MG capsule Take 1 capsule by mouth twice daily 30 capsule 0   BUPRENORPHINE  HCL SL Place under the tongue. 4mg /1 mg QID prn     Buprenorphine HCl-Naloxone HCl 4-1 MG FILM SMARTSIG:1 Strip(s) Sublingual 4 Times Daily     calcium  carbonate (TUMS - DOSED IN MG ELEMENTAL CALCIUM ) 500 MG chewable tablet Chew 1 tablet (200 mg of elemental calcium  total) by mouth 3 (three) times daily with meals. 90 tablet 6   clotrimazole  (CLOTRIMAZOLE  AF) 1 % cream Apply 1 application topically 2 (two) times daily. To affected area for 1-2 weeks 30 g 0   dexlansoprazole  (DEXILANT ) 60 MG capsule Take 1 capsule (60 mg total) by mouth daily. 90 capsule 3   fluticasone  (FLONASE ) 50 MCG/ACT nasal spray USE TWO SPRAY(S) IN EACH NOSTRIL ONCE DAILY AS NEEDED FOR ALLERGY OR RHINITIS 16 g 2   gabapentin  (NEURONTIN ) 400 MG capsule Take 1 capsule by mouth 4 times daily 90 capsule 0   lactase (LACTAID) 3000 units tablet Take 1 tablet (3,000 Units total) by mouth daily as needed (lactose intolerance). 90 tablet 1   Lidocaine  HCl 4 % LIQD Apply 1 application topically 3 (three)  times daily as needed (pain).     magnesium  hydroxide (MILK OF MAGNESIA) 400 MG/5ML suspension Take 30 mLs by mouth daily as needed for mild constipation.     Menthol  10 % AERO Apply 1 application topically 3 (three) times daily as needed (pain).     nystatin  (MYCOSTATIN ) 100000 UNIT/ML suspension TAKE 5 MLS BY MOUTH 4 TIMES DAILY AS NEEDED. 500 mL 0   SYMBICORT  80-4.5 MCG/ACT inhaler Inhale 2 puffs by mouth twice daily 33 g 0   triamcinolone  (KENALOG ) 0.1 % Apply 1 application topically 2 (two) times daily. 30 g 0   VENTOLIN  HFA 108 (90 Base) MCG/ACT inhaler INHALE 2 PUFFS BY MOUTH EVERY 6 HOURS AS NEEDED FOR WHEEZING FOR SHORTNESS OF BREATH 18 g 0   amLODipine  (NORVASC ) 10 MG tablet Take 1 tablet (10 mg total) by mouth daily. 90 tablet 0   No current facility-administered medications for this visit.    Allergies as of 08/14/2023 - Review Complete 08/14/2023  Allergen Reaction Noted   Naproxen Shortness Of Breath 01/09/2016   Dicyclomine  hcl Other (See Comments) 01/24/2021   Duloxetine Other (See Comments) 01/24/2021   Egg-derived products Diarrhea 02/20/2018   Lovastatin Other (See Comments) 09/25/2010   Toradol  [ketorolac  tromethamine ] Other (See Comments) 02/20/2016   Tramadol Other (See Comments) 02/20/2016   Nexium  [esomeprazole  magnesium ] Diarrhea 06/12/2012    Family History  Problem Relation Age of Onset   Hypothyroidism Mother    Heart disease Father    Parkinson's disease Father    Hypothyroidism Daughter    Heart attack Paternal Grandfather    Alzheimer's disease Paternal Grandmother    Heart attack Maternal Grandfather    Crohn's disease Sister    Other Sister        was murdered   Crohn's disease Maternal Uncle    Colon cancer Neg Hx    Colon polyps Neg Hx     Social History   Socioeconomic History   Marital status: Single    Spouse name: Forestine Igo   Number of children: 2   Years of education: GED   Highest education level: Not on file  Occupational History     Comment: disabled  Tobacco Use   Smoking status: Every Day    Current packs/day: 0.25    Average packs/day: 0.3 packs/day for 30.0 years (7.5 ttl pk-yrs)    Types: Cigarettes   Smokeless tobacco: Never  Vaping Use   Vaping status: Never Used  Substance and Sexual Activity   Alcohol use: Not Currently    Comment: wine/beer a few times a week    Drug use: No   Sexual activity: Not Currently    Birth control/protection: Post-menopausal  Other Topics Concern   Not on file  Social History Narrative   Lives with husband   no caffiene   Social Drivers of Health   Financial Resource Strain: Low Risk  (01/14/2022)   Overall Financial Resource Strain (CARDIA)    Difficulty of Paying Living Expenses: Not hard at all  Food Insecurity: No Food Insecurity (01/14/2022)   Hunger Vital Sign    Worried About Running Out of Food in the Last Year: Never true    Ran Out of Food in the Last Year: Never true  Transportation Needs: No Transportation Needs (01/14/2022)   PRAPARE - Administrator, Civil Service (Medical): No    Lack of Transportation (Non-Medical): No  Physical Activity: Insufficiently Active (01/14/2022)   Exercise Vital Sign    Days of Exercise per Week: 2 days    Minutes of Exercise per Session: 20 min  Stress: No Stress Concern Present (01/14/2022)   [employer] of Occupational Health - Occupational Stress Questionnaire    Feeling of Stress : Not at all  Social Connections: Moderately Isolated (01/14/2022)   Social Connection and Isolation Panel [NHANES]    Frequency of Communication with Friends and Family: Three times a week    Frequency of Social Gatherings with Friends and Family: Once a week    Attends Religious Services: Never    Database administrator or Organizations: No    Attends Banker Meetings: Never    Marital Status: Married  Catering manager Violence: Not At Risk (01/14/2022)   Humiliation, Afraid, Rape, and Kick questionnaire     Fear of Current or Ex-Partner: No    Emotionally Abused: No    Physically Abused: No    Sexually Abused: No     Review of Systems   Gen: Denies any fever, chills, fatigue, weight loss, lack of appetite.  CV: Denies chest pain, heart palpitations, peripheral edema, syncope.  Resp: Denies shortness of breath at rest or with exertion. Denies wheezing or cough.  GI: Denies dysphagia or odynophagia. Denies jaundice, hematemesis, fecal incontinence. GU : Denies urinary burning, urinary frequency, urinary hesitancy MS: Denies joint pain, muscle weakness, cramps, or limitation of movement.  Derm: Denies rash, itching, dry skin Psych: Denies depression, anxiety, memory loss, and confusion Heme: Denies bruising, bleeding, and enlarged lymph nodes.   Physical Exam   BP 133/81 (BP Location: Right Arm, Patient Position: Sitting, Cuff Size: Normal)   Pulse 67   Temp 98.1 F (36.7 C) (Oral)   Ht 5' (1.524 m)   Wt 104 lb 3.2 oz (47.3 kg)   SpO2 98%   BMI 20.35 kg/m  General:   Alert and oriented. Pleasant and cooperative. Well-nourished and well-developed.  Head:  Normocephalic and atraumatic. Eyes:  Without icterus Abdomen:  +BS, soft, non-tender and non-distended. No HSM noted. No guarding or rebound. No masses appreciated.  Rectal:  Deferred  Msk:  Symmetrical without gross deformities. Normal posture. Extremities:  Without edema. Neurologic:  Alert and  oriented x4;  grossly normal neurologically. Skin:  Intact without significant lesions or rashes. Psych:  Alert and cooperative. Normal mood and affect.   Assessment   [name] [name] is a 68 y.o. female presenting  today with a history of Crohn's disease s/p ileocecectomy 2006 with surgical remission and no maintenance medications, hepatic steatosis with elastography noting low risk of fibrosis and known stable small gallbladder polyp, elevated LFTs, chronic GERD, dysphagia, found to have PUD on EGD in Feb 2025 and due for  surveillance.   PUD: negative H.pylori. continues on Dexilant . Will arrange surveillance in near future.   Chronic abdominal pain: dating back many years. Exacerbated postprandially but does not appear typical of CMI. No weight loss or food fear. Noted to have gallbladder polyp that is stable. Could consider further biliary evaluation.   Elevated LFTs: likely secondary to hepatic steatosis. Due to chronic elevation, will check further serologies.      PLAN   Continue Dexilant  Continue to avoid NSAIDs Proceed with upper endoscopy by Dr. Mordechai April in near future: the risks, benefits, and alternatives have been discussed with the patient in detail. The patient states understanding and desires to proceed.  Further serologies for elevated LFTs 3 month follow-up   Delman Ferns, PhD, ANP-BC [facility]

## 2023-08-14 NOTE — H&P (View-Only) (Signed)
 Gastroenterology Office Note     Primary Care Physician:  Tobi Fortes, MD  Primary Gastroenterologist: Dr. Mordechai April   Chief Complaint   Chief Complaint  Patient presents with   Follow-up    States that she is needing another EGD     History of Present Illness   [name] [name] is a 68 y.o. female presenting today with a history of  Crohn's disease s/p ileocecectomy 2006 with surgical remission and no maintenance medications, hepatic steatosis with elastography noting low risk of fibrosis and known stable small gallbladder polyp, elevated LFTs, chronic GERD, dysphagia, recently undergoing EGD and colonoscopy as noted below.  EGD with findings of small hiatal hernia, Schatzki ring s/p dilation, LA Grade A reflux esophagitis, gastritis (negative H.pylori) and non-bleeding gastric ulcer. She needs surveillance.   Colonoscopy with non-bleeding internal hemorrhoids, normal colon, patent anastomosis, 5 year surveillance due to history of adenomas.   Today she notes chronic abdominal pain at baseline. Noted as "everywhere", intermittent, usually exacerbated by food choices. Ongoing for years. No weight loss. No food aversions. No food fear. Anything greasy or spicy causes abdominal pain. No romaine lettuce. Dexilant  once daily. No NSAIDs. No aspirin powders. Taking Dexilant  daily. She is in a hurry as she has another appointment upcoming.       Colonoscopy 04/21/2018 external/internal hemorrhoids, sigmoid diverticulosis, 2 polyps removed (tubular adenomas on pathology)   EGD 11/24/2018 with distal esophageal stenosis status post dilation up to 17 mm. Small hiatal hernia. Gastritis. Normal duodenum.   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   Crohn disease (HCC)    CTS (carpal tunnel  syndrome)    Diarrhea MULITIFACTORIAL   IBS, LACTOSE INTOLERANCE, SBBO, BILE-SALT   Elevated liver enzymes 2009: BMI 24 ?Etoh ALT 94, AST 51,  NEG ANA, qIGs& ASMA   MAY 2012 AST 52 ALT 38   Esophageal stricture 2009   GERD (gastroesophageal reflux disease)    Hemorrhoid    Hyperlipemia    Hypertension    Inflammatory bowel disease 2006 CD   SURGICAL REMISSION   LBP (low back pain)    Mental disorder    Migraine    Osteoporosis    PONV (postoperative nausea and vomiting)    Shortness of breath    exertion and humidity   Shoulder pain    right   Sleep apnea    Stop Bang Cock score of 4    Past Surgical History:  Procedure Laterality Date   BACK SURGERY     BACK SURGERY  07/16/2018   BILATERAL SALPINGOOPHORECTOMY     BIOPSY  12/24/2011   Procedure: BIOPSY;  Surgeon: Alyce Jubilee, MD;  Location: [facility];  Service: Endoscopy;;  Gastric Biopsies   BIOPSY N/A 06/30/2012   Procedure: BIOPSY;  Surgeon: Alyce Jubilee, MD;  Location: [facility];  Service: Endoscopy;  Laterality: N/A;  Gastric and Esophageal Biopsies   BIOPSY  06/23/2023   Procedure: BIOPSY;  Surgeon: Vinetta Greening, DO;  [facility]: [facility];  Service: Endoscopy;;   CARPAL TUNNEL RELEASE  LEFT   CATARACT EXTRACTION W/PHACO Right 07/30/2016   Procedure: CATARACT EXTRACTION PHACO AND INTRAOCULAR LENS PLACEMENT (IOC);  Surgeon: Albert Huff, MD;  Location: [facility];  Service: Ophthalmology;  Laterality: Right;  CDE: 5.45   CATARACT EXTRACTION W/PHACO Left 08/13/2016   Procedure: CATARACT EXTRACTION PHACO AND INTRAOCULAR LENS PLACEMENT (IOC);  Surgeon: Albert Huff, MD;  Location: [facility];  Service: Ophthalmology;  Laterality: Left;  CDE: 5.59   COLON SURGERY     COLONOSCOPY  JAN 2009 DIARRHEA, ABD PAIN, BRBPR   ANASTOMOTIC ULCER(3MM), IH WU:[id number] ULCER, NL COLON bX   COLONOSCOPY WITH PROPOFOL  N/A 04/21/2018   examined portion of the ileum normal, two 3-5 mm polyps, diverticulosis in the rectosigmoid and sigmoid colon,  external and internal hemorrhoids, significant looping of the colon.  Pathology with tubular adenomas.  Recommendations to follow high-fiber diet and repeat colonoscopy in 5 to 10 years with peds colonoscope.   COLONOSCOPY WITH PROPOFOL  N/A 06/23/2023   Procedure: COLONOSCOPY WITH PROPOFOL ;  Surgeon: Vinetta Greening, DO;  [facility]: [facility];  Service: Endoscopy;  Laterality: N/A;  10:00 am, asa 3   DILATION AND CURETTAGE OF UTERUS     ESOPHAGEAL DILATION  12/24/2011   SLF: A stricture was found in the distal esophagus/ Moderate gastritis/ MILD Duodenitis   ESOPHAGEAL DILATION N/A 06/23/2023   Procedure: ESOPHAGEAL DILATION;  Surgeon: Vinetta Greening, DO;  [facility]: [facility];  Service: Endoscopy;  Laterality: N/A;   ESOPHAGOGASTRODUODENOSCOPY  02/07/09   mild gastritis   ESOPHAGOGASTRODUODENOSCOPY (EGD) WITH PROPOFOL  N/A 06/30/2012   SLF: 1. No definite stricture appreciated 2. small hiatal hernia 3. Gastritis   ESOPHAGOGASTRODUODENOSCOPY (EGD) WITH PROPOFOL  N/A 02/20/2016   Procedure: ESOPHAGOGASTRODUODENOSCOPY (EGD) WITH PROPOFOL ;  Surgeon: Alyce Jubilee, MD;  [facility];  Service: Endoscopy;  Laterality: N/A;  930   ESOPHAGOGASTRODUODENOSCOPY (EGD) WITH PROPOFOL  N/A 11/24/2018   benign-appearing esophageal peptic stricture s/p dilation, small hiatal hernia, moderate erosive gastritis.    ESOPHAGOGASTRODUODENOSCOPY (EGD) WITH PROPOFOL  N/A 06/23/2023   Procedure: ESOPHAGOGASTRODUODENOSCOPY (EGD) WITH PROPOFOL ;  Surgeon: Vinetta Greening, DO;  [facility]: [facility];  Service: Endoscopy;  Laterality: N/A;   EYE SURGERY     gum flap Bilateral    HEMICOLECTOMY  RIGHT 2006   HERNIA REPAIR     LUMBAR DISC SURGERY     POLYPECTOMY  04/21/2018   Procedure: POLYPECTOMY;  Surgeon: Alyce Jubilee, MD;  [facility]: [facility];  Service: Endoscopy;;  (colon)   SAVORY DILATION N/A 06/30/2012   Procedure: SAVORY DILATION;  Surgeon: Alyce Jubilee, MD;  Location: [facility];  Service:  Endoscopy;  Laterality: N/A;  12.92mm , 14mm, 15mm   SAVORY DILATION N/A 02/20/2016   Procedure: SAVORY DILATION;  Surgeon: Alyce Jubilee, MD;  [facility]: [facility];  Service: Endoscopy;  Laterality: N/A;   SAVORY DILATION N/A 11/24/2018   Procedure: SAVORY DILATION;  Surgeon: Alyce Jubilee, MD;  [facility]: [facility];  Service: Endoscopy;  Laterality: N/A;   UPPER GASTROINTESTINAL ENDOSCOPY  JAN 2009 ABD PAIN   GASTRITIS, ESO RING   UPPER GASTROINTESTINAL ENDOSCOPY  OCT 2010 ABD PAIN, DYSPHAGIA   DIL , GASTRITIS, NL DUODENUM   UPPER GASTROINTESTINAL ENDOSCOPY  OCT 2010 DYSPHAGIA   DIL 17 MM, GASTRITIS, NL DUODENUM   vocal cord surgery  Sep 20, 2010   precancerous areas removed   wisdom tooth extraction Right    pin placement due to broken jaw    Current Outpatient Medications  Medication Sig Dispense Refill   benzonatate  (TESSALON ) 100 MG capsule Take 1 capsule by mouth twice daily 30 capsule 0   BUPRENORPHINE  HCL SL Place under the tongue. 4mg /1 mg QID prn     Buprenorphine HCl-Naloxone HCl 4-1 MG FILM SMARTSIG:1 Strip(s) Sublingual 4 Times Daily     calcium  carbonate (TUMS - DOSED IN MG ELEMENTAL CALCIUM ) 500 MG chewable tablet Chew 1 tablet (200 mg of elemental calcium  total) by mouth 3 (three) times daily with meals. 90 tablet 6   clotrimazole  (CLOTRIMAZOLE  AF) 1 % cream Apply 1 application topically 2 (two) times daily. To affected area for 1-2 weeks 30 g 0   dexlansoprazole  (DEXILANT ) 60 MG capsule Take 1 capsule (60 mg total) by mouth daily. 90 capsule 3   fluticasone  (FLONASE ) 50 MCG/ACT nasal spray USE TWO SPRAY(S) IN EACH NOSTRIL ONCE DAILY AS NEEDED FOR ALLERGY OR RHINITIS 16 g 2   gabapentin  (NEURONTIN ) 400 MG capsule Take 1 capsule by mouth 4 times daily 90 capsule 0   lactase (LACTAID) 3000 units tablet Take 1 tablet (3,000 Units total) by mouth daily as needed (lactose intolerance). 90 tablet 1   Lidocaine  HCl 4 % LIQD Apply 1 application topically 3 (three)  times daily as needed (pain).     magnesium  hydroxide (MILK OF MAGNESIA) 400 MG/5ML suspension Take 30 mLs by mouth daily as needed for mild constipation.     Menthol  10 % AERO Apply 1 application topically 3 (three) times daily as needed (pain).     nystatin  (MYCOSTATIN ) 100000 UNIT/ML suspension TAKE 5 MLS BY MOUTH 4 TIMES DAILY AS NEEDED. 500 mL 0   SYMBICORT  80-4.5 MCG/ACT inhaler Inhale 2 puffs by mouth twice daily 33 g 0   triamcinolone  (KENALOG ) 0.1 % Apply 1 application topically 2 (two) times daily. 30 g 0   VENTOLIN  HFA 108 (90 Base) MCG/ACT inhaler INHALE 2 PUFFS BY MOUTH EVERY 6 HOURS AS NEEDED FOR WHEEZING FOR SHORTNESS OF BREATH 18 g 0   amLODipine  (NORVASC ) 10 MG tablet Take 1 tablet (10 mg total) by mouth daily. 90 tablet 0   No current facility-administered medications for this visit.    Allergies as of 08/14/2023 - Review Complete 08/14/2023  Allergen Reaction Noted   Naproxen Shortness Of Breath 01/09/2016   Dicyclomine  hcl Other (See Comments) 01/24/2021   Duloxetine Other (See Comments) 01/24/2021   Egg-derived products Diarrhea 02/20/2018   Lovastatin Other (See Comments) 09/25/2010   Toradol  [ketorolac  tromethamine ] Other (See Comments) 02/20/2016   Tramadol Other (See Comments) 02/20/2016   Nexium  [esomeprazole  magnesium ] Diarrhea 06/12/2012    Family History  Problem Relation Age of Onset   Hypothyroidism Mother    Heart disease Father    Parkinson's disease Father    Hypothyroidism Daughter    Heart attack Paternal Grandfather    Alzheimer's disease Paternal Grandmother    Heart attack Maternal Grandfather    Crohn's disease Sister    Other Sister        was murdered   Crohn's disease Maternal Uncle    Colon cancer Neg Hx    Colon polyps Neg Hx     Social History   Socioeconomic History   Marital status: Single    Spouse name: [name] [name]   Number of children: 2   Years of education: GED   Highest education level: Not on file  Occupational History     Comment: disabled  Tobacco Use   Smoking status: Every Day    Current packs/day: 0.25    Average packs/day: 0.3 packs/day for 30.0 years (7.5 ttl pk-yrs)    Types: Cigarettes   Smokeless tobacco: Never  Vaping Use   Vaping status: Never Used  Substance and Sexual Activity   Alcohol use: Not Currently    Comment: wine/beer a few times a week    Drug use: No   Sexual activity: Not Currently    Birth control/protection: Post-menopausal  Other Topics Concern   Not on file  Social History Narrative   Lives with husband   no caffiene   Social Drivers of Health   Financial Resource Strain: Low Risk  (01/14/2022)   Overall Financial Resource Strain (CARDIA)    Difficulty of Paying Living Expenses: Not hard at all  Food Insecurity: No Food Insecurity (01/14/2022)   Hunger Vital Sign    Worried About Running Out of Food in the Last Year: Never true    Ran Out of Food in the Last Year: Never true  Transportation Needs: No Transportation Needs (01/14/2022)   PRAPARE - Administrator, Civil Service (Medical): No    Lack of Transportation (Non-Medical): No  Physical Activity: Insufficiently Active (01/14/2022)   Exercise Vital Sign    Days of Exercise per Week: 2 days    Minutes of Exercise per Session: 20 min  Stress: No Stress Concern Present (01/14/2022)   [employer] of Occupational Health - Occupational Stress Questionnaire    Feeling of Stress : Not at all  Social Connections: Moderately Isolated (01/14/2022)   Social Connection and Isolation Panel [NHANES]    Frequency of Communication with Friends and Family: Three times a week    Frequency of Social Gatherings with Friends and Family: Once a week    Attends Religious Services: Never    Database administrator or Organizations: No    Attends Banker Meetings: Never    Marital Status: Married  Catering manager Violence: Not At Risk (01/14/2022)   Humiliation, Afraid, Rape, and Kick questionnaire     Fear of Current or Ex-Partner: No    Emotionally Abused: No    Physically Abused: No    Sexually Abused: No     Review of Systems   Gen: Denies any fever, chills, fatigue, weight loss, lack of appetite.  CV: Denies chest pain, heart palpitations, peripheral edema, syncope.  Resp: Denies shortness of breath at rest or with exertion. Denies wheezing or cough.  GI: Denies dysphagia or odynophagia. Denies jaundice, hematemesis, fecal incontinence. GU : Denies urinary burning, urinary frequency, urinary hesitancy MS: Denies joint pain, muscle weakness, cramps, or limitation of movement.  Derm: Denies rash, itching, dry skin Psych: Denies depression, anxiety, memory loss, and confusion Heme: Denies bruising, bleeding, and enlarged lymph nodes.   Physical Exam   BP 133/81 (BP Location: Right Arm, Patient Position: Sitting, Cuff Size: Normal)   Pulse 67   Temp 98.1 F (36.7 C) (Oral)   Ht 5' (1.524 m)   Wt 104 lb 3.2 oz (47.3 kg)   SpO2 98%   BMI 20.35 kg/m  General:   Alert and oriented. Pleasant and cooperative. Well-nourished and well-developed.  Head:  Normocephalic and atraumatic. Eyes:  Without icterus Abdomen:  +BS, soft, non-tender and non-distended. No HSM noted. No guarding or rebound. No masses appreciated.  Rectal:  Deferred  Msk:  Symmetrical without gross deformities. Normal posture. Extremities:  Without edema. Neurologic:  Alert and  oriented x4;  grossly normal neurologically. Skin:  Intact without significant lesions or rashes. Psych:  Alert and cooperative. Normal mood and affect.   Assessment   [name] [name] is a 68 y.o. female presenting  today with a history of Crohn's disease s/p ileocecectomy 2006 with surgical remission and no maintenance medications, hepatic steatosis with elastography noting low risk of fibrosis and known stable small gallbladder polyp, elevated LFTs, chronic GERD, dysphagia, found to have PUD on EGD in Feb 2025 and due for  surveillance.   PUD: negative H.pylori. continues on Dexilant . Will arrange surveillance in near future.   Chronic abdominal pain: dating back many years. Exacerbated postprandially but does not appear typical of CMI. No weight loss or food fear. Noted to have gallbladder polyp that is stable. Could consider further biliary evaluation.   Elevated LFTs: likely secondary to hepatic steatosis. Due to chronic elevation, will check further serologies.      PLAN   Continue Dexilant  Continue to avoid NSAIDs Proceed with upper endoscopy by Dr. Mordechai April in near future: the risks, benefits, and alternatives have been discussed with the patient in detail. The patient states understanding and desires to proceed.  Further serologies for elevated LFTs 3 month follow-up   Delman Ferns, PhD, ANP-BC [facility]

## 2023-08-14 NOTE — Progress Notes (Unsigned)
 Established Patient Office Visit  Subjective   Patient ID: [name] [name], female    DOB: [birth year]  Age: 68 y.o. MRN: [id number]  Chief Complaint  Patient presents with   Care Management    Six month follow up    [name] [name] presents today for follow up.  She was last evaluated on 3/25 for an acute visit in the setting of persistent bilateral ankle edema.  Amlodipine was discontinued in favor of hydrochlorothiazide.  Today she reports that there was no significant change in symptoms despite the change in medications.  She has since discontinued HCTZ and returned to amlodipine.  She has been seen by gastroenterology for evaluation in the setting of elevated LFTs since her last appointment.  There have otherwise been no acute interval events.  Today she says that her ankle edema is not as severe as it has been previously.  She would like to review additional treatment options.  Her additional concerns are poorly controlled seasonal allergies.  She would like treatment recommendations.  Lastly, she endorses night sweats and drooling intermittently for the last month.  Symptoms do not occur every night.  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   Crohn disease (HCC)    CTS (carpal tunnel syndrome)    Diarrhea MULITIFACTORIAL   IBS, LACTOSE INTOLERANCE, SBBO, BILE-SALT   Elevated liver enzymes 2009: BMI 24 ?Etoh ALT 94, AST 51,  NEG ANA, qIGs& ASMA   MAY 2012 AST 52 ALT 38   Esophageal stricture 2009   GERD (gastroesophageal reflux disease)    Hemorrhoid    Hyperlipemia    Hypertension    Inflammatory bowel disease 2006 CD   SURGICAL REMISSION   LBP (low back pain)    Mental disorder    Migraine    Osteoporosis    PONV (postoperative nausea and vomiting)    Shortness of breath    exertion and  humidity   Shoulder pain    right   Sleep apnea    Stop Bang Cock score of 4   Past Surgical History:  Procedure Laterality Date   BACK SURGERY     BACK SURGERY  07/16/2018   BILATERAL SALPINGOOPHORECTOMY     BIOPSY  12/24/2011   Procedure: BIOPSY;  Surgeon: West Bali, MD;  Location: [facility];  Service: Endoscopy;;  Gastric Biopsies   BIOPSY N/A 06/30/2012   Procedure: BIOPSY;  Surgeon: West Bali, MD;  Location: [facility];  Service: Endoscopy;  Laterality: N/A;  Gastric and Esophageal Biopsies   BIOPSY  06/23/2023   Procedure: BIOPSY;  Surgeon: Lanelle Bal, DO;  [facility];  Service: Endoscopy;;   CARPAL TUNNEL RELEASE  LEFT   CATARACT EXTRACTION W/PHACO Right 07/30/2016   Procedure: CATARACT EXTRACTION PHACO AND INTRAOCULAR LENS PLACEMENT (IOC);  Surgeon: Jethro Bolus, MD;  Location: [facility];  Service: Ophthalmology;  Laterality: Right;  CDE: 5.45   CATARACT EXTRACTION W/PHACO Left 08/13/2016   Procedure: CATARACT EXTRACTION PHACO AND INTRAOCULAR LENS PLACEMENT (IOC);  Surgeon: Jethro Bolus, MD;  Location: [facility];  Service: Ophthalmology;  Laterality: Left;  CDE: 5.59   COLON SURGERY     COLONOSCOPY  JAN 2009 DIARRHEA, ABD PAIN, BRBPR   ANASTOMOTIC ULCER(3MM),  IH [id number] ULCER, NL COLON bX   COLONOSCOPY WITH PROPOFOL N/A 04/21/2018   examined portion of the ileum normal, two 3-5 mm polyps, diverticulosis in the rectosigmoid and sigmoid colon, external and internal hemorrhoids, significant looping of the colon.  Pathology with tubular adenomas.  Recommendations to follow high-fiber diet and repeat colonoscopy in 5 to 10 years with peds colonoscope.   COLONOSCOPY WITH PROPOFOL N/A 06/23/2023   Procedure: COLONOSCOPY WITH PROPOFOL;  Surgeon: Lanelle Bal, DO;  [facility];  Service: Endoscopy;  Laterality: N/A;  10:00 am, asa 3   DILATION AND CURETTAGE OF UTERUS     ESOPHAGEAL DILATION  12/24/2011   SLF: A stricture was found in the distal esophagus/  Moderate gastritis/ MILD Duodenitis   ESOPHAGEAL DILATION N/A 06/23/2023   Procedure: ESOPHAGEAL DILATION;  Surgeon: Lanelle Bal, DO;  [facility];  Service: Endoscopy;  Laterality: N/A;   ESOPHAGOGASTRODUODENOSCOPY  02/07/09   mild gastritis   ESOPHAGOGASTRODUODENOSCOPY (EGD) WITH PROPOFOL N/A 06/30/2012   SLF: 1. No definite stricture appreciated 2. small hiatal hernia 3. Gastritis   ESOPHAGOGASTRODUODENOSCOPY (EGD) WITH PROPOFOL N/A 02/20/2016   Procedure: ESOPHAGOGASTRODUODENOSCOPY (EGD) WITH PROPOFOL;  Surgeon: West Bali, MD;  [facility];  Service: Endoscopy;  Laterality: N/A;  930   ESOPHAGOGASTRODUODENOSCOPY (EGD) WITH PROPOFOL N/A 11/24/2018   benign-appearing esophageal peptic stricture s/p dilation, small hiatal hernia, moderate erosive gastritis.    ESOPHAGOGASTRODUODENOSCOPY (EGD) WITH PROPOFOL N/A 06/23/2023   Procedure: ESOPHAGOGASTRODUODENOSCOPY (EGD) WITH PROPOFOL;  Surgeon: Lanelle Bal, DO;  [facility];  Service: Endoscopy;  Laterality: N/A;   EYE SURGERY     gum flap Bilateral    HEMICOLECTOMY  RIGHT 2006   HERNIA REPAIR     LUMBAR DISC SURGERY     POLYPECTOMY  04/21/2018   Procedure: POLYPECTOMY;  Surgeon: West Bali, MD;  [facility];  Service: Endoscopy;;  (colon)   SAVORY DILATION N/A 06/30/2012   Procedure: SAVORY DILATION;  Surgeon: West Bali, MD;  Location: [facility];  Service: Endoscopy;  Laterality: N/A;  12.38mm , 14mm, 15mm   SAVORY DILATION N/A 02/20/2016   Procedure: SAVORY DILATION;  Surgeon: West Bali, MD;  [facility];  Service: Endoscopy;  Laterality: N/A;   SAVORY DILATION N/A 11/24/2018   Procedure: SAVORY DILATION;  Surgeon: West Bali, MD;  [facility];  Service: Endoscopy;  Laterality: N/A;   UPPER GASTROINTESTINAL ENDOSCOPY  JAN 2009 ABD PAIN   GASTRITIS, ESO RING   UPPER GASTROINTESTINAL ENDOSCOPY  OCT 2010 ABD PAIN, DYSPHAGIA   DIL ,  GASTRITIS, NL DUODENUM   UPPER GASTROINTESTINAL ENDOSCOPY  OCT 2010 DYSPHAGIA   DIL 17 MM, GASTRITIS, NL DUODENUM   vocal cord surgery  Sep 20, 2010   precancerous areas removed   wisdom tooth extraction Right    pin placement due to broken jaw   Social History   Tobacco Use   Smoking status: Every Day    Current packs/day: 0.25    Average packs/day: 0.3 packs/day for 30.0 years (7.5 ttl pk-yrs)    Types: Cigarettes   Smokeless tobacco: Never  Vaping Use   Vaping status: Never Used  Substance Use Topics   Alcohol use: Not Currently    Comment: wine/beer a few times a week    Drug use: No   Family History  Problem Relation Age of Onset   Hypothyroidism Mother    Heart disease Father  Parkinson's disease Father    Hypothyroidism Daughter    Heart attack Paternal Grandfather    Alzheimer's disease Paternal Grandmother    Heart attack Maternal Grandfather    Crohn's disease Sister    Other Sister        was murdered   Crohn's disease Maternal Uncle    Colon cancer Neg Hx    Colon polyps Neg Hx    Allergies  Allergen Reactions   Naproxen Shortness Of Breath    Chest pain   Dicyclomine Hcl Other (See Comments)    Vision changes   Duloxetine Other (See Comments)    Suicidal    Egg-Derived Products Diarrhea    abd pain   Lovastatin Other (See Comments)    Chest pain   Toradol [Ketorolac Tromethamine] Other (See Comments)    Headache. Non-effective   Tramadol Other (See Comments)    Headache. Non-effective.   Nexium [Esomeprazole Magnesium] Diarrhea    Abdominal pain   Review of Systems  Constitutional:  Positive for diaphoresis. Negative for chills, fever and weight loss.  HENT:  Positive for congestion.   Respiratory:  Positive for cough and shortness of breath.   Genitourinary:  Positive for frequency.  Endo/Heme/Allergies:  Positive for environmental allergies.     Objective:     BP 135/69   Pulse 76   Ht 5' (1.524 m)   Wt 104 lb 9.6 oz (47.4 kg)    SpO2 97%   BMI 20.43 kg/m  BP Readings from Last 3 Encounters:  08/14/23 135/69  08/14/23 133/81  07/29/23 124/72   Physical Exam Constitutional:      General: She is not in acute distress.    Appearance: Normal appearance. She is not toxic-appearing.  HENT:     Head: Normocephalic and atraumatic.     Right Ear: External ear normal.     Left Ear: External ear normal.     Nose: Congestion present. No rhinorrhea.     Mouth/Throat:     Mouth: Mucous membranes are moist.     Pharynx: Oropharynx is clear. No oropharyngeal exudate or posterior oropharyngeal erythema.  Eyes:     General: No scleral icterus.    Extraocular Movements: Extraocular movements intact.     Conjunctiva/sclera: Conjunctivae normal.     Pupils: Pupils are equal, round, and reactive to light.  Cardiovascular:     Rate and Rhythm: Normal rate and regular rhythm.     Pulses: Normal pulses.     Heart sounds: Normal heart sounds. No murmur heard.    No friction rub. No gallop.  Pulmonary:     Effort: Pulmonary effort is normal.     Breath sounds: Normal breath sounds. No wheezing, rhonchi or rales.  Abdominal:     General: Abdomen is flat. Bowel sounds are normal. There is no distension.     Palpations: Abdomen is soft.     Tenderness: There is no abdominal tenderness.  Musculoskeletal:        General: Swelling (Trace lateral ankle edema bilaterally) present. Normal range of motion.     Cervical back: Normal range of motion.     Right lower leg: No edema.     Left lower leg: No edema.  Lymphadenopathy:     Cervical: No cervical adenopathy.  Skin:    General: Skin is warm and dry.     Capillary Refill: Capillary refill takes less than 2 seconds.     Coloration: Skin is not jaundiced.  Neurological:  General: No focal deficit present.     Mental Status: She is alert and oriented to person, place, and time.  Psychiatric:        Mood and Affect: Mood normal.        Behavior: Behavior normal.   Last  CBC Lab Results  Component Value Date   WBC 4.5 02/13/2023   HGB 14.7 02/13/2023   HCT 42.1 02/13/2023   MCV 103 (H) 02/13/2023   MCH 36.0 (H) 02/13/2023   RDW 11.8 02/13/2023   PLT 199 02/13/2023   Last metabolic panel Lab Results  Component Value Date   GLUCOSE 95 02/13/2023   NA 140 02/13/2023   K 3.9 02/13/2023   CL 95 (L) 02/13/2023   CO2 25 02/13/2023   BUN 5 (L) 02/13/2023   CREATININE 0.47 (L) 02/13/2023   EGFR 106 02/13/2023   CALCIUM 9.6 02/13/2023   PHOS 3.5 09/26/2014   PROT 7.0 02/13/2023   ALBUMIN 4.5 02/13/2023   LABGLOB 2.5 02/13/2023   AGRATIO 2.0 07/22/2022   BILITOT 0.5 02/13/2023   ALKPHOS 135 (H) 02/13/2023   AST 56 (H) 02/13/2023   ALT 33 (H) 02/13/2023   ANIONGAP 12 01/31/2020   Last lipids Lab Results  Component Value Date   CHOL 300 (H) 02/13/2023   HDL 209 02/13/2023   LDLCALC 81 02/13/2023   LDLDIRECT 29 03/11/2011   TRIG 70 02/13/2023   CHOLHDL 1.4 02/13/2023   Last hemoglobin A1c Lab Results  Component Value Date   HGBA1C 4.9 02/13/2023   Last thyroid functions Lab Results  Component Value Date   TSH 1.600 02/13/2023   Last vitamin D Lab Results  Component Value Date   VD25OH 75.8 02/13/2023   Last vitamin B12 and Folate Lab Results  Component Value Date   VITAMINB12 439 02/13/2023   FOLATE 12.9 02/13/2023     Assessment & Plan:   Problem List Items Addressed This Visit       Essential hypertension - Primary   Remains adequately controlled with amlodipine 10 mg daily, which was previously discontinued in the setting of ankle edema but there was no change in symptoms after discontinuation.  HCTZ caused increased frequency of urination.  Continue amlodipine 10 mg daily.      Allergic rhinitis   Symptoms are poorly controlled.  Recommend regular use of fluticasone nasal spray.      Ankle edema, bilateral   Trace ankle edema is present bilaterally on exam today.  As otherwise documented, no significant change in  symptoms despite discontinuing amlodipine.  Suspect her symptoms are ultimately due to venous insufficiency.  Recommend wearing compression stockings and leg elevation when possible.      Night sweats   She endorses a 1 month history of intermittent night sweats and drooling.  Symptoms do not occur every night.  Etiology unclear.  Suspect this may be related to congestion in the setting of poorly controlled allergic rhinitis.  Addressing allergies as otherwise documented and will update basic labs today.      Return in about 3 months (around 11/13/2023).   Billie Lade, MD

## 2023-08-14 NOTE — Patient Instructions (Signed)
 We will call you to arrange the upper endoscopy!  Please have blood work completed when you are able.  We will see you in 3 months!  I enjoyed seeing you again today! I value our relationship and want to provide genuine, compassionate, and quality care. You may receive a survey regarding your visit with me, and I welcome your feedback! Thanks so much for taking the time to complete this. I look forward to seeing you again.      Gelene Mink, PhD, ANP-BC [facility]

## 2023-08-14 NOTE — Patient Instructions (Signed)
 It was a pleasure to see you today.  Thank you for giving [place] the opportunity to be involved in your care.  Below is a brief recap of your visit and next steps.  We will plan to see you again in 3 months.  Summary I recommend using fluticasone properly and cetirizine  Repeat labs Try using compression stockings for swelling Follow up in 3 months

## 2023-08-15 ENCOUNTER — Telehealth: Payer: Self-pay | Admitting: Internal Medicine

## 2023-08-15 ENCOUNTER — Other Ambulatory Visit: Payer: Self-pay

## 2023-08-15 ENCOUNTER — Other Ambulatory Visit: Payer: Self-pay | Admitting: Internal Medicine

## 2023-08-15 DIAGNOSIS — I10 Essential (primary) hypertension: Secondary | ICD-10-CM

## 2023-08-15 DIAGNOSIS — R61 Generalized hyperhidrosis: Secondary | ICD-10-CM | POA: Insufficient documentation

## 2023-08-15 MED ORDER — AMLODIPINE BESYLATE 10 MG PO TABS: 10.0000 mg | ORAL_TABLET | Freq: Every day | ORAL | 0 refills | Status: DC

## 2023-08-15 NOTE — Telephone Encounter (Signed)
 Patient husband here following up on Amplodipine Rx- says she needs it and it has not been sent in. Requesting [facility] in [place]. Thank you

## 2023-08-15 NOTE — Assessment & Plan Note (Signed)
 Trace ankle edema is present bilaterally on exam today.  As otherwise documented, no significant change in symptoms despite discontinuing amlodipine.  Suspect her symptoms are ultimately due to venous insufficiency.  Recommend wearing compression stockings and leg elevation when possible.

## 2023-08-15 NOTE — Telephone Encounter (Signed)
 Refills sent to pharmacy.

## 2023-08-15 NOTE — Assessment & Plan Note (Signed)
 She endorses a 1 month history of intermittent night sweats and drooling.  Symptoms do not occur every night.  Etiology unclear.  Suspect this may be related to congestion in the setting of poorly controlled allergic rhinitis.  Addressing allergies as otherwise documented and will update basic labs today.

## 2023-08-15 NOTE — Assessment & Plan Note (Signed)
 Remains adequately controlled with amlodipine 10 mg daily, which was previously discontinued in the setting of ankle edema but there was no change in symptoms after discontinuation.  HCTZ caused increased frequency of urination.  Continue amlodipine 10 mg daily.

## 2023-08-15 NOTE — Assessment & Plan Note (Signed)
 Symptoms are poorly controlled.  Recommend regular use of fluticasone nasal spray.

## 2023-08-21 ENCOUNTER — Telehealth: Payer: Self-pay | Admitting: *Deleted

## 2023-08-21 ENCOUNTER — Encounter: Payer: Self-pay | Admitting: *Deleted

## 2023-08-21 NOTE — Telephone Encounter (Signed)
 [employer] PA:  Notification or Prior Authorization is not required for the requested services You are not required to submit a notification/prior authorization based on the information provided. If you have general questions about the prior authorization requirements, visit [email or web address] > Clinician Resources > Advance and Admission Notification Requirements. The number above acknowledges your notification. Please write this reference number down for future reference. If you would like to request an organization determination, please call us  at [phone]. Decision ID #: [id number]

## 2023-09-08 ENCOUNTER — Ambulatory Visit (INDEPENDENT_AMBULATORY_CARE_PROVIDER_SITE_OTHER)

## 2023-09-08 VITALS — BP 127/75 | HR 107 | Ht 60.0 in | Wt 100.1 lb

## 2023-09-08 DIAGNOSIS — K219 Gastro-esophageal reflux disease without esophagitis: Secondary | ICD-10-CM | POA: Diagnosis not present

## 2023-09-08 DIAGNOSIS — R232 Flushing: Secondary | ICD-10-CM | POA: Diagnosis not present

## 2023-09-08 DIAGNOSIS — L0231 Cutaneous abscess of buttock: Secondary | ICD-10-CM | POA: Diagnosis not present

## 2023-09-08 DIAGNOSIS — M810 Age-related osteoporosis without current pathological fracture: Secondary | ICD-10-CM | POA: Diagnosis not present

## 2023-09-08 DIAGNOSIS — I10 Essential (primary) hypertension: Secondary | ICD-10-CM | POA: Diagnosis not present

## 2023-09-08 DIAGNOSIS — L03317 Cellulitis of buttock: Secondary | ICD-10-CM

## 2023-09-08 DIAGNOSIS — E782 Mixed hyperlipidemia: Secondary | ICD-10-CM | POA: Diagnosis not present

## 2023-09-08 NOTE — Patient Instructions (Signed)
 Recommend cleaning area with antibacterial soap (Dial) and water .   Sit in a sitz bath daily as needed.   Apply triple antibiotic ointiment 3-4 times a day.

## 2023-09-08 NOTE — Progress Notes (Unsigned)
   Acute Office Visit  Subjective:     Patient ID: [name] [name], female    DOB: 01-Jan-1956, 68 y.o.   MRN: [id number]  Chief Complaint  Patient presents with   Medical Management of Chronic Issues    Pt states "has a growth like cyst on her back and bottom"    HPI Patient is in today for Skin Ulcer: Patient complains of skin ulcer. The ulcer is located on the buttocks. Ulcer has been present 2 days. Pain is rated 5/10. Interventions to date: none. Patient admits to tobacco use. Patient denies have a history of diabetes.      ROS      Objective:    BP 127/75   Pulse (!) 107   Ht 5' (1.524 m)   Wt 100 lb 1.3 oz (45.4 kg)   SpO2 92%   BMI 19.55 kg/m  BP Readings from Last 3 Encounters:  09/08/23 127/75  08/14/23 135/69  08/14/23 133/81    Physical Exam Vitals and nursing note reviewed.  Constitutional:      Appearance: Normal appearance.  Eyes:     Extraocular Movements: Extraocular movements intact.     Pupils: Pupils are equal, round, and reactive to light.  Cardiovascular:     Rate and Rhythm: Normal rate and regular rhythm.  Pulmonary:     Effort: Pulmonary effort is normal.     Breath sounds: Normal breath sounds.  Musculoskeletal:     Cervical back: Normal range of motion and neck supple.  Skin:    Findings: Abscess and erythema present.       Neurological:     Mental Status: She is alert and oriented to person, place, and time.  Psychiatric:        Mood and Affect: Mood normal.        Thought Content: Thought content normal.     No results found for any visits on 09/08/23.      Assessment & Plan:   Problem List Items Addressed This Visit   None Visit Diagnoses       Cellulitis and abscess of buttock    -  Primary   recommend warm water  soaks with a sitz bath, using Dial soap and applying topical triple antibiotic ointment.       No orders of the defined types were placed in this encounter.   No follow-ups on file.  Alison Irvine, FNP

## 2023-09-09 ENCOUNTER — Encounter: Payer: Self-pay | Admitting: Internal Medicine

## 2023-09-09 LAB — VITAMIN D 25 HYDROXY (VIT D DEFICIENCY, FRACTURES): Vit D, 25-Hydroxy: 51.3 ng/mL (ref 30.0–100.0)

## 2023-09-09 LAB — LIPID PANEL
Chol/HDL Ratio: 1.7 ratio (ref 0.0–4.4)
Cholesterol, Total: 238 mg/dL — ABNORMAL HIGH (ref 100–199)
HDL: 142 mg/dL (ref >39)
LDL Chol Calc (NIH): 85 mg/dL (ref 0–99)
Triglycerides: 63 mg/dL (ref 0–149)
VLDL Cholesterol Cal: 11 mg/dL (ref 5–40)

## 2023-09-09 LAB — HEMOGLOBIN A1C
Est. average glucose Bld gHb Est-mCnc: 97 mg/dL
Hgb A1c MFr Bld: 5 % (ref 4.8–5.6)

## 2023-09-09 LAB — TSH+FREE T4
Free T4: 0.89 ng/dL (ref 0.82–1.77)
TSH: 1.33 u[IU]/mL (ref 0.450–4.500)

## 2023-09-09 LAB — B12 AND FOLATE PANEL
Folate: 6.5 ng/mL (ref >3.0)
Vitamin B-12: 371 pg/mL (ref 232–1245)

## 2023-09-10 ENCOUNTER — Encounter (HOSPITAL_COMMUNITY): Payer: Self-pay

## 2023-09-10 ENCOUNTER — Encounter (HOSPITAL_COMMUNITY)
Admission: RE | Admit: 2023-09-10 | Discharge: 2023-09-10 | Disposition: A | Discharge status: Home / Self Care | Source: Ambulatory Visit | Attending: Internal Medicine | Admitting: Internal Medicine

## 2023-09-10 ENCOUNTER — Other Ambulatory Visit: Payer: Self-pay

## 2023-09-10 NOTE — Pre-Procedure Instructions (Signed)
Attempted pre-op phone call. MB is full and I could not leave a message.

## 2023-09-10 NOTE — Patient Instructions (Signed)
 [name] [name]  09/10/2023     @PREFPERIOPPHARMACY @   Your procedure is scheduled on  09/11/2023.   Report to [facility] at  [phone] A.M.   Call this number if you have problems the morning of surgery:  [phone]  If you experience any cold or flu symptoms such as cough, fever, chills, shortness of breath, etc. between now and your scheduled surgery, please notify us  at the above number.   Remember:  Follow the diet instructions given to you by the office.       Use your inhalers before you come and bring your rescue inhaler with you.   You may drink clear liquids until  0415 am on 09/11/2023.    Clear liquids allowed are:                    Water , Juice (No red color; non-citric and without pulp; diabetics please choose diet or no sugar options), Carbonated beverages (diabetics please choose diet or no sugar options), Clear Tea (No creamer, milk, or cream, including half & half and powdered creamer), Black Coffee Only (No creamer, milk or cream, including half & half and powdered creamer), and Clear Sports drink (No red color; diabetics please choose diet or no sugar options)    Take these medicines the morning of surgery with A SIP OF WATER                        amlodipine , dexlansoprazole , gabapentin .    Do not wear jewelry, make-up or nail polish, including gel polish,  artificial nails, or any other type of covering on natural nails (fingers and  toes).  Do not wear lotions, powders, or perfumes, or deodorant.  Do not shave 48 hours prior to surgery.  Men may shave face and neck.  Do not bring valuables to the hospital.  [facility] is not responsible for any belongings or valuables.  Contacts, dentures or bridgework may not be worn into surgery.  Leave your suitcase in the car.  After surgery it may be brought to your room.  For patients admitted to the hospital, discharge time will be determined by your treatment team.  Patients discharged the day of surgery will  not be allowed to drive home and must have someone with them for 24 hours.    Special instructions:   DO NOT smoke tobacco or vape for 24 hours before your procedure.  Please read over the following fact sheets that you were given. Anesthesia Post-op Instructions and Care and Recovery After Surgery      Upper Endoscopy, Adult, Care After After the procedure, it is common to have a sore throat. It is also common to have: Mild stomach pain or discomfort. Bloating. Nausea. Follow these instructions at home: The instructions below may help you care for yourself at home. Your health care provider may give you more instructions. If you have questions, ask your health care provider. If you were given a sedative during the procedure, it can affect you for several hours. Do not drive or operate machinery until your health care provider says that it is safe. If you will be going home right after the procedure, plan to have a responsible adult: Take you home from the hospital or clinic. You will not be allowed to drive. Care for you for the time you are told. Follow instructions from your health care provider about what you may eat and  drink. Return to your normal activities as told by your health care provider. Ask your health care provider what activities are safe for you. Take over-the-counter and prescription medicines only as told by your health care provider. Contact a health care provider if you: Have a sore throat that lasts longer than one day. Have trouble swallowing. Have a fever. Get help right away if you: Vomit blood or your vomit looks like coffee grounds. Have bloody, black, or tarry stools. Have a very bad sore throat or you cannot swallow. Have difficulty breathing or very bad pain in your chest or abdomen. These symptoms may be an emergency. Get help right away. Call 911. Do not wait to see if the symptoms will go away. Do not drive yourself to the hospital. Summary After  the procedure, it is common to have a sore throat, mild stomach discomfort, bloating, and nausea. If you were given a sedative during the procedure, it can affect you for several hours. Do not drive until your health care provider says that it is safe. Follow instructions from your health care provider about what you may eat and drink. Return to your normal activities as told by your health care provider. This information is not intended to replace advice given to you by your health care provider. Make sure you discuss any questions you have with your health care provider. Document Revised: 08/01/2021 Document Reviewed: 08/01/2021 Elsevier Patient Education  2024 [employer].General Anesthesia, Adult, Care After The following information offers guidance on how to care for yourself after your procedure. Your health care provider may also give you more specific instructions. If you have problems or questions, contact your health care provider. What can I expect after the procedure? After the procedure, it is common for people to: Have pain or discomfort at the IV site. Have nausea or vomiting. Have a sore throat or hoarseness. Have trouble concentrating. Feel cold or chills. Feel weak, sleepy, or tired (fatigue). Have soreness and body aches. These can affect parts of the body that were not involved in surgery. Follow these instructions at home: For the time period you were told by your health care provider:  Rest. Do not participate in activities where you could fall or become injured. Do not drive or use machinery. Do not drink alcohol. Do not take sleeping pills or medicines that cause drowsiness. Do not make important decisions or sign legal documents. Do not take care of children on your own. General instructions Drink enough fluid to keep your urine pale yellow. If you have sleep apnea, surgery and certain medicines can increase your risk for breathing problems. Follow instructions  from your health care provider about wearing your sleep device: Anytime you are sleeping, including during daytime naps. While taking prescription pain medicines, sleeping medicines, or medicines that make you drowsy. Return to your normal activities as told by your health care provider. Ask your health care provider what activities are safe for you. Take over-the-counter and prescription medicines only as told by your health care provider. Do not use any products that contain nicotine  or tobacco. These products include cigarettes, chewing tobacco, and vaping devices, such as e-cigarettes. These can delay incision healing after surgery. If you need help quitting, ask your health care provider. Contact a health care provider if: You have nausea or vomiting that does not get better with medicine. You vomit every time you eat or drink. You have pain that does not get better with medicine. You cannot urinate or have  bloody urine. You develop a skin rash. You have a fever. Get help right away if: You have trouble breathing. You have chest pain. You vomit blood. These symptoms may be an emergency. Get help right away. Call 911. Do not wait to see if the symptoms will go away. Do not drive yourself to the hospital. Summary After the procedure, it is common to have a sore throat, hoarseness, nausea, vomiting, or to feel weak, sleepy, or fatigue. For the time period you were told by your health care provider, do not drive or use machinery. Get help right away if you have difficulty breathing, have chest pain, or vomit blood. These symptoms may be an emergency. This information is not intended to replace advice given to you by your health care provider. Make sure you discuss any questions you have with your health care provider. Document Revised: 07/20/2021 Document Reviewed: 07/20/2021 Elsevier Patient Education  2024 [employer].

## 2023-09-11 ENCOUNTER — Encounter (HOSPITAL_COMMUNITY)
Admission: RE | Disposition: A | Discharge status: Home / Self Care | Payer: Self-pay | Source: Home / Self Care | Attending: Internal Medicine

## 2023-09-11 ENCOUNTER — Ambulatory Visit (HOSPITAL_COMMUNITY): Admitting: Anesthesiology

## 2023-09-11 ENCOUNTER — Ambulatory Visit (HOSPITAL_COMMUNITY)
Admission: RE | Admit: 2023-09-11 | Discharge: 2023-09-11 | Disposition: A | Discharge status: Home / Self Care | Attending: Internal Medicine | Admitting: Internal Medicine

## 2023-09-11 ENCOUNTER — Telehealth: Payer: Self-pay | Admitting: Internal Medicine

## 2023-09-11 ENCOUNTER — Encounter (HOSPITAL_COMMUNITY): Payer: Self-pay | Admitting: Internal Medicine

## 2023-09-11 DIAGNOSIS — K222 Esophageal obstruction: Secondary | ICD-10-CM

## 2023-09-11 DIAGNOSIS — K2960 Other gastritis without bleeding: Secondary | ICD-10-CM | POA: Diagnosis not present

## 2023-09-11 DIAGNOSIS — F17210 Nicotine dependence, cigarettes, uncomplicated: Secondary | ICD-10-CM | POA: Diagnosis not present

## 2023-09-11 DIAGNOSIS — Z8379 Family history of other diseases of the digestive system: Secondary | ICD-10-CM | POA: Diagnosis not present

## 2023-09-11 DIAGNOSIS — Z79899 Other long term (current) drug therapy: Secondary | ICD-10-CM | POA: Insufficient documentation

## 2023-09-11 DIAGNOSIS — K2970 Gastritis, unspecified, without bleeding: Secondary | ICD-10-CM | POA: Diagnosis not present

## 2023-09-11 DIAGNOSIS — G4730 Sleep apnea, unspecified: Secondary | ICD-10-CM | POA: Diagnosis not present

## 2023-09-11 DIAGNOSIS — Z8249 Family history of ischemic heart disease and other diseases of the circulatory system: Secondary | ICD-10-CM | POA: Insufficient documentation

## 2023-09-11 DIAGNOSIS — K449 Diaphragmatic hernia without obstruction or gangrene: Secondary | ICD-10-CM | POA: Diagnosis not present

## 2023-09-11 DIAGNOSIS — K7460 Unspecified cirrhosis of liver: Secondary | ICD-10-CM | POA: Insufficient documentation

## 2023-09-11 DIAGNOSIS — K219 Gastro-esophageal reflux disease without esophagitis: Secondary | ICD-10-CM | POA: Insufficient documentation

## 2023-09-11 DIAGNOSIS — I10 Essential (primary) hypertension: Secondary | ICD-10-CM

## 2023-09-11 DIAGNOSIS — G709 Myoneural disorder, unspecified: Secondary | ICD-10-CM | POA: Insufficient documentation

## 2023-09-11 DIAGNOSIS — K5090 Crohn's disease, unspecified, without complications: Secondary | ICD-10-CM | POA: Insufficient documentation

## 2023-09-11 DIAGNOSIS — K760 Fatty (change of) liver, not elsewhere classified: Secondary | ICD-10-CM | POA: Diagnosis not present

## 2023-09-11 DIAGNOSIS — Z8711 Personal history of peptic ulcer disease: Secondary | ICD-10-CM | POA: Diagnosis not present

## 2023-09-11 DIAGNOSIS — J449 Chronic obstructive pulmonary disease, unspecified: Secondary | ICD-10-CM | POA: Diagnosis not present

## 2023-09-11 DIAGNOSIS — K2100 Gastro-esophageal reflux disease with esophagitis, without bleeding: Secondary | ICD-10-CM | POA: Diagnosis not present

## 2023-09-11 DIAGNOSIS — F17200 Nicotine dependence, unspecified, uncomplicated: Secondary | ICD-10-CM | POA: Diagnosis not present

## 2023-09-11 DIAGNOSIS — J4489 Other specified chronic obstructive pulmonary disease: Secondary | ICD-10-CM | POA: Insufficient documentation

## 2023-09-11 HISTORY — PX: ESOPHAGOGASTRODUODENOSCOPY: SHX5428

## 2023-09-11 SURGERY — EGD (ESOPHAGOGASTRODUODENOSCOPY)
Anesthesia: General

## 2023-09-11 MED ORDER — PROPOFOL 500 MG/50ML IV EMUL
INTRAVENOUS | Status: DC | PRN
Start: 2023-09-11 — End: 2023-09-11
  Administered 2023-09-11: 75 ug/kg/min via INTRAVENOUS

## 2023-09-11 MED ORDER — LACTATED RINGERS IV SOLN: INTRAVENOUS | Status: DC

## 2023-09-11 MED ORDER — LACTATED RINGERS IV SOLN: INTRAVENOUS | Status: DC | PRN

## 2023-09-11 MED ORDER — LIDOCAINE 2% (20 MG/ML) 5 ML SYRINGE
INTRAMUSCULAR | Status: DC | PRN
Start: 2023-09-11 — End: 2023-09-11
  Administered 2023-09-11: 80 mg via INTRAVENOUS

## 2023-09-11 NOTE — Anesthesia Preprocedure Evaluation (Signed)
 Anesthesia Evaluation  Patient identified by MRN, date of birth, ID band Patient awake    Reviewed: Allergy & Precautions, H&P , NPO status , Patient's Chart, lab work & pertinent test results, reviewed documented beta blocker date and time   History of Anesthesia Complications (+) PONV and history of anesthetic complications  Airway Mallampati: II  TM Distance: >3 FB Neck ROM: full    Dental no notable dental hx.    Pulmonary shortness of breath, asthma , sleep apnea , COPD, Current Smoker   Pulmonary exam normal breath sounds clear to auscultation       Cardiovascular Exercise Tolerance: Good hypertension,  Rhythm:regular Rate:Normal     Neuro/Psych  Headaches PSYCHIATRIC DISORDERS Anxiety Depression     Neuromuscular disease    GI/Hepatic ,GERD  ,,(+) Cirrhosis   Esophageal Varices      Endo/Other  Hypothyroidism    Renal/GU negative Renal ROS  negative genitourinary   Musculoskeletal   Abdominal   Peds  Hematology negative hematology ROS (+)   Anesthesia Other Findings   Reproductive/Obstetrics negative OB ROS                             Anesthesia Physical Anesthesia Plan  ASA: 3  Anesthesia Plan: General   Post-op Pain Management:    Induction:   PONV Risk Score and Plan: Propofol  infusion  Airway Management Planned:   Additional Equipment:   Intra-op Plan:   Post-operative Plan:   Informed Consent: I have reviewed the patients History and Physical, chart, labs and discussed the procedure including the risks, benefits and alternatives for the proposed anesthesia with the patient or authorized representative who has indicated his/her understanding and acceptance.     Dental Advisory Given  Plan Discussed with: CRNA  Anesthesia Plan Comments:        Anesthesia Quick Evaluation

## 2023-09-11 NOTE — Telephone Encounter (Signed)
 Placard  Noted Copied Sleeved (put in provider box)

## 2023-09-11 NOTE — Telephone Encounter (Signed)
Called patient form ready for pick up 

## 2023-09-11 NOTE — Discharge Instructions (Addendum)
 EGD Discharge instructions Please read the instructions outlined below and refer to this sheet in the next few weeks. These discharge instructions provide you with general information on caring for yourself after you leave the hospital. Your doctor may also give you specific instructions. While your treatment has been planned according to the most current medical practices available, unavoidable complications occasionally occur. If you have any problems or questions after discharge, please call your doctor. ACTIVITY You may resume your regular activity but move at a slower pace for the next 24 hours.  Take frequent rest periods for the next 24 hours.  Walking will help expel (get rid of) the air and reduce the bloated feeling in your abdomen.  No driving for 24 hours (because of the anesthesia (medicine) used during the test).  You may shower.  Do not sign any important legal documents or operate any machinery for 24 hours (because of the anesthesia used during the test).  NUTRITION Drink plenty of fluids.  You may resume your normal diet.  Begin with a light meal and progress to your normal diet.  Avoid alcoholic beverages for 24 hours or as instructed by your caregiver.  MEDICATIONS You may resume your normal medications unless your caregiver tells you otherwise.  WHAT YOU CAN EXPECT TODAY You may experience abdominal discomfort such as a feeling of fullness or "gas" pains.  FOLLOW-UP Your doctor will discuss the results of your test with you.  SEEK IMMEDIATE MEDICAL ATTENTION IF ANY OF THE FOLLOWING OCCUR: Excessive nausea (feeling sick to your stomach) and/or vomiting.  Severe abdominal pain and distention (swelling).  Trouble swallowing.  Temperature over 101 F (37.8 C).  Rectal bleeding or vomiting of blood.   Your EGD revealed mild amount inflammation in your stomach.  I took biopsies of this to rule out infection with a bacteria called H. pylori.  Await pathology results, my  office will contact you.  Previously noted esophagitis and gastric ulcer have both healed.  Avoid NSAIDs.  Small bowel was normal.  Follow-up in GI office in 2 to 3 months. Office will call with appointment    I hope you have a great rest of your week!  Rolando Cliche. Mordechai April, D.O. Gastroenterology and Hepatology [facility]

## 2023-09-11 NOTE — Transfer of Care (Signed)
 Immediate Anesthesia Transfer of Care Note  Patient: [name] [name]  Procedure(s) Performed: EGD (ESOPHAGOGASTRODUODENOSCOPY)  Patient Location: PACU and Nursing Unit  Anesthesia Type:MAC  Level of Consciousness: awake and alert   Airway & Oxygen Therapy: Patient Spontanous Breathing and Patient connected to nasal cannula oxygen  Post-op Assessment: Report given to RN and Post -op Vital signs reviewed and stable  Post vital signs: Reviewed and stable  Last Vitals:  Vitals Value Taken Time  BP    Temp    Pulse    Resp    SpO2      Last Pain:  Vitals:   09/11/23 0831  PainSc: 0-No pain         Complications: No notable events documented.

## 2023-09-11 NOTE — Interval H&P Note (Signed)
 History and Physical Interval Note:  09/11/2023 8:21 AM  [name] [name]  has presented today for surgery, with the diagnosis of PUD.  The various methods of treatment have been discussed with the patient and family. After consideration of risks, benefits and other options for treatment, the patient has consented to  Procedure(s) with comments: EGD (ESOPHAGOGASTRODUODENOSCOPY) (N/A) - 8:15 AM, ASA 3 as a surgical intervention.  The patient's history has been reviewed, patient examined, no change in status, stable for surgery.  I have reviewed the patient's chart and labs.  Questions were answered to the patient's satisfaction.     Vinetta Greening

## 2023-09-11 NOTE — Op Note (Signed)
 [facility] Patient Name: [name] [name] Procedure Date: 09/11/2023 8:13 AM MRN: [id number] Date of Birth: 02-Feb-1956 Attending MD: Rolando Cliche. Mordechai April , [place], [id number] CSN: [id number] Age: 68 Admit Type: Outpatient Procedure:                Upper GI endoscopy Indications:              Follow-up of reflux esophagitis, Follow-up of                            peptic ulcer Providers:                Rolando Cliche. Mordechai April, DO, Tammy Vaught, RN, Graydon Lazier RN, RN, Alisa App, Annell Barrow Referring MD:              Medicines:                See the Anesthesia note for documentation of the                            administered medications Complications:            No immediate complications. Estimated Blood Loss:     Estimated blood loss was minimal. Procedure:                Pre-Anesthesia Assessment:                           - The anesthesia plan was to use monitored                            anesthesia care (MAC).                           After obtaining informed consent, the endoscope was                            passed under direct vision. Throughout the                            procedure, the patient's blood pressure, pulse, and                            oxygen saturations were monitored continuously. The                            [id number]-H190 ([id number]) scope was introduced through the                            mouth, and advanced to the second part of duodenum.                            The upper GI endoscopy was accomplished without  difficulty. The patient tolerated the procedure                            well. Scope In: 8:38:18 AM Scope Out: 8:42:08 AM Total Procedure Duration: 0 hours 3 minutes 50 seconds  Findings:      A small hiatal hernia was present.      A mild Schatzki ring was found in the distal esophagus, improved       compared to prior. Esophagitis has healed.      Diffuse mild inflammation  characterized by erythema was found in the       entire examined stomach. Biopsies were taken with a cold forceps for       Helicobacter pylori testing. Previously noted ulcer has healed.      The duodenal bulb, first portion of the duodenum and second portion of       the duodenum were normal. Impression:               - Small hiatal hernia.                           - Mild Schatzki ring.                           - Gastritis. Biopsied.                           - Normal duodenal bulb, first portion of the                            duodenum and second portion of the duodenum. Moderate Sedation:      Per Anesthesia Care Recommendation:           - Patient has a contact number available for                            emergencies. The signs and symptoms of potential                            delayed complications were discussed with the                            patient. Return to normal activities tomorrow.                            Written discharge instructions were provided to the                            patient.                           - Resume previous diet.                           - Continue present medications.                           - Await pathology results.                           -  Use Dexilant  (dexlansoprazole ) 60 mg PO daily.                           - No ibuprofen, naproxen, or other non-steroidal                            anti-inflammatory drugs.                           - Return to GI clinic in 3 months. Procedure Code(s):        --- Professional ---                           [id number], Esophagogastroduodenoscopy, flexible,                            transoral; with biopsy, single or multiple Diagnosis Code(s):        --- Professional ---                           K44.9, Diaphragmatic hernia without obstruction or                            gangrene                           K22.2, Esophageal obstruction                           K29.70, Gastritis,  unspecified, without bleeding                           K21.00, Gastro-esophageal reflux disease with                            esophagitis, without bleeding                           K27.9, Peptic ulcer, site unspecified, unspecified                            as acute or chronic, without hemorrhage or                            perforation CPT copyright 2022 American Medical Association. All rights reserved. The codes documented in this report are preliminary and upon coder review may  be revised to meet current compliance requirements. Rolando Cliche. Mordechai April, DO Rolando Cliche. Mordechai April, DO 09/11/2023 8:46:02 AM This report has been signed electronically. Number of Addenda: 0

## 2023-09-12 ENCOUNTER — Encounter (HOSPITAL_COMMUNITY): Payer: Self-pay | Admitting: Internal Medicine

## 2023-09-12 LAB — SURGICAL PATHOLOGY

## 2023-09-12 NOTE — Anesthesia Postprocedure Evaluation (Signed)
 Anesthesia Post Note  Patient: [name] [name]  Procedure(s) Performed: EGD (ESOPHAGOGASTRODUODENOSCOPY)  Patient location during evaluation: Phase II Anesthesia Type: General Level of consciousness: awake Pain management: pain level controlled Vital Signs Assessment: post-procedure vital signs reviewed and stable Respiratory status: spontaneous breathing and respiratory function stable Cardiovascular status: blood pressure returned to baseline and stable Postop Assessment: no headache and no apparent nausea or vomiting Anesthetic complications: no Comments: Late entry   No notable events documented.   Last Vitals:  Vitals:   09/11/23 0711 09/11/23 0846  BP: 138/89 106/72  Pulse:  82  Resp: 16 19  Temp: 36.9 C 36.8 C  SpO2: 96% 94%    Last Pain:  Vitals:   09/11/23 0846  TempSrc: Axillary  PainSc: 0-No pain                 Coretha Dew

## 2023-09-22 DIAGNOSIS — Z79891 Long term (current) use of opiate analgesic: Secondary | ICD-10-CM | POA: Diagnosis not present

## 2023-09-22 DIAGNOSIS — G894 Chronic pain syndrome: Secondary | ICD-10-CM | POA: Diagnosis not present

## 2023-09-22 DIAGNOSIS — M5442 Lumbago with sciatica, left side: Secondary | ICD-10-CM | POA: Diagnosis not present

## 2023-09-25 ENCOUNTER — Other Ambulatory Visit: Payer: Self-pay | Admitting: Internal Medicine

## 2023-09-25 ENCOUNTER — Ambulatory Visit: Payer: Self-pay | Admitting: Internal Medicine

## 2023-09-25 DIAGNOSIS — J302 Other seasonal allergic rhinitis: Secondary | ICD-10-CM

## 2023-09-25 DIAGNOSIS — J410 Simple chronic bronchitis: Secondary | ICD-10-CM

## 2023-09-25 DIAGNOSIS — B370 Candidal stomatitis: Secondary | ICD-10-CM

## 2023-10-01 ENCOUNTER — Telehealth: Payer: Self-pay

## 2023-10-01 NOTE — Telephone Encounter (Signed)
 Copied from CRM [id number]. Topic: General - Other >> Oct 01, 2023  3:47 PM Essie A wrote: Reason for CRM: Patient is trying to get an appointment with her pcp so she can get an x-ray on her neck.  Please return her call asap to let her know if this can be done at [phone].

## 2023-10-02 ENCOUNTER — Encounter: Payer: Self-pay | Admitting: Internal Medicine

## 2023-10-02 ENCOUNTER — Telehealth: Payer: Self-pay | Admitting: Internal Medicine

## 2023-10-02 ENCOUNTER — Ambulatory Visit (INDEPENDENT_AMBULATORY_CARE_PROVIDER_SITE_OTHER): Admitting: Internal Medicine

## 2023-10-02 VITALS — BP 125/79 | HR 102 | Wt 102.4 lb

## 2023-10-02 DIAGNOSIS — S161XXA Strain of muscle, fascia and tendon at neck level, initial encounter: Secondary | ICD-10-CM | POA: Diagnosis not present

## 2023-10-02 MED ORDER — CYCLOBENZAPRINE HCL 5 MG PO TABS
5.0000 mg | ORAL_TABLET | Freq: Three times a day (TID) | ORAL | 1 refills | Status: DC | PRN
Start: 2023-10-02 — End: 2023-11-12

## 2023-10-02 MED ORDER — METHYLPREDNISOLONE 4 MG PO TBPK: ORAL_TABLET | ORAL | 0 refills | Status: DC

## 2023-10-02 NOTE — Telephone Encounter (Signed)
 Scheduled

## 2023-10-02 NOTE — Assessment & Plan Note (Signed)
 Presenting today for an acute visit endorsing a 2-day history of neck pain that began shortly after mopping her floor as described above.  ROM is significantly limited, particularly extension and lateral movements of the left.  There is tenderness over the left trapezius.  Her history and exam findings are most concerning for a cervical strain, particularly her left trapezius strain.  Treatment options reviewed.  Will prescribe Medrol  Dosepak and Flexeril  for symptom relief.  She has also been provided with home PT exercises to help with gradually improving her range of motion.  She can also alternate icing and heat.  She was instructed to return to care if symptoms worsen or fail to improve by next week.

## 2023-10-02 NOTE — Progress Notes (Signed)
   Acute Office Visit  Subjective:     Patient ID: [name] [name], female    DOB: 1956/03/13, 68 y.o.   MRN: [id number]  Chief Complaint  Patient presents with   Neck Pain    Neck pain , can barely walk. Patient not sure if its a pinched nerve. Can not move her head , pain radiating in spine and shoulders   [name] [name] presents today for an acute visit in the setting of acute neck pain x 2 days.  Symptoms began shortly after she mopped her floor.  She endorses pain with movement of her neck in general, mostly extension and lateral movements to the left.  She has tried applying Aspercreme without significant pain relief.  She endorses occasional paresthesias in the left arm.  Pain radiates to her shoulders and down her spine.  Review of Systems  Musculoskeletal:  Positive for neck pain.      Objective:    BP 125/79   Pulse (!) 102   Wt 102 lb 6.4 oz (46.4 kg)   SpO2 92%   BMI 20.00 kg/m   Physical Exam Vitals reviewed.  Constitutional:      General: She is not in acute distress.    Appearance: Normal appearance.  Neck:     Comments: No obvious deformity on inspection of the neck.  ROM is limited in general secondary to pain, extension and lateral movements to the left most prominently.  ROM at the shoulders is generally intact.  Grip strength and sensation intact in the upper extremities bilaterally. Musculoskeletal:     Cervical back: Tenderness (Left trapezius) present.  Neurological:     Mental Status: She is alert.       Assessment & Plan:   Problem List Items Addressed This Visit       Cervical strain, acute, initial encounter   Presenting today for an acute visit endorsing a 2-day history of neck pain that began shortly after mopping her floor as described above.  ROM is significantly limited, particularly extension and lateral movements of the left.  There is tenderness over the left trapezius.  Her history and exam findings are most concerning for a cervical  strain, particularly her left trapezius strain.  Treatment options reviewed.  Will prescribe Medrol  Dosepak and Flexeril  for symptom relief.  She has also been provided with home PT exercises to help with gradually improving her range of motion.  She can also alternate icing and heat.  She was instructed to return to care if symptoms worsen or fail to improve by next week.      Meds ordered this encounter  Medications   methylPREDNISolone  (MEDROL  DOSEPAK) 4 MG TBPK tablet    Sig: Use as directed.    Dispense:  21 each    Refill:  0   cyclobenzaprine  (FLEXERIL ) 5 MG tablet    Sig: Take 1 tablet (5 mg total) by mouth 3 (three) times daily as needed for muscle spasms.    Dispense:  30 tablet    Refill:  1    Return if symptoms worsen or fail to improve.  [name] Fortes, MD

## 2023-10-02 NOTE — Telephone Encounter (Signed)
 Called patient voicemail not set up

## 2023-10-02 NOTE — Patient Instructions (Signed)
 It was a pleasure to see you today.  Thank you for giving us  the opportunity to be involved in your care.  Below is a brief recap of your visit and next steps.   Summary Medrol  dosepak and flexeril  prescribed for neck strain Please see attached exercises Follow up if symptoms are not improving

## 2023-10-02 NOTE — Telephone Encounter (Signed)
 Copied from CRM [id number]. Topic: General - Other >> Oct 02, 2023  8:06 AM Carlatta H wrote: Reason for CRM: Patient received a call this morning may have been in reference to getting an xray//Please return call

## 2023-10-09 ENCOUNTER — Other Ambulatory Visit: Payer: Self-pay | Admitting: Family Medicine

## 2023-10-09 ENCOUNTER — Telehealth: Payer: Self-pay | Admitting: Internal Medicine

## 2023-10-09 DIAGNOSIS — M542 Cervicalgia: Secondary | ICD-10-CM

## 2023-10-09 NOTE — Telephone Encounter (Signed)
 Patient husband here says pt was told at previous appt that if her next did not get any better then an xray would be ordered for her. Says patient is needing an xray and is requesting a call to discuss. Thank you

## 2023-10-09 NOTE — Telephone Encounter (Signed)
 sent

## 2023-10-09 NOTE — Telephone Encounter (Signed)
 Neck  x ray

## 2023-10-09 NOTE — Telephone Encounter (Signed)
 Patient advised.

## 2023-10-10 ENCOUNTER — Ambulatory Visit (HOSPITAL_COMMUNITY)
Admission: RE | Admit: 2023-10-10 | Discharge: 2023-10-10 | Disposition: A | Discharge status: Home / Self Care | Source: Ambulatory Visit | Attending: Family Medicine | Admitting: Family Medicine

## 2023-10-10 DIAGNOSIS — M542 Cervicalgia: Secondary | ICD-10-CM | POA: Insufficient documentation

## 2023-10-10 DIAGNOSIS — M47812 Spondylosis without myelopathy or radiculopathy, cervical region: Secondary | ICD-10-CM | POA: Diagnosis not present

## 2023-10-14 ENCOUNTER — Ambulatory Visit: Payer: Self-pay | Admitting: Internal Medicine

## 2023-10-27 ENCOUNTER — Inpatient Hospital Stay (HOSPITAL_COMMUNITY)
Admission: EM | Admit: 2023-10-27 | Discharge: 2023-10-31 | DRG: 640 | Disposition: A | Discharge status: Home Health | Attending: Family Medicine | Admitting: Family Medicine

## 2023-10-27 ENCOUNTER — Emergency Department (HOSPITAL_COMMUNITY)

## 2023-10-27 ENCOUNTER — Encounter (HOSPITAL_COMMUNITY): Payer: Self-pay

## 2023-10-27 ENCOUNTER — Other Ambulatory Visit: Payer: Self-pay

## 2023-10-27 ENCOUNTER — Encounter: Payer: Self-pay | Admitting: Internal Medicine

## 2023-10-27 DIAGNOSIS — Z7951 Long term (current) use of inhaled steroids: Secondary | ICD-10-CM | POA: Diagnosis not present

## 2023-10-27 DIAGNOSIS — F10139 Alcohol abuse with withdrawal, unspecified: Secondary | ICD-10-CM | POA: Diagnosis present

## 2023-10-27 DIAGNOSIS — Z860101 Personal history of adenomatous and serrated colon polyps: Secondary | ICD-10-CM | POA: Diagnosis not present

## 2023-10-27 DIAGNOSIS — R569 Unspecified convulsions: Secondary | ICD-10-CM | POA: Diagnosis present

## 2023-10-27 DIAGNOSIS — M47812 Spondylosis without myelopathy or radiculopathy, cervical region: Secondary | ICD-10-CM | POA: Diagnosis not present

## 2023-10-27 DIAGNOSIS — Z888 Allergy status to other drugs, medicaments and biological substances status: Secondary | ICD-10-CM | POA: Diagnosis not present

## 2023-10-27 DIAGNOSIS — R404 Transient alteration of awareness: Secondary | ICD-10-CM | POA: Diagnosis not present

## 2023-10-27 DIAGNOSIS — J410 Simple chronic bronchitis: Secondary | ICD-10-CM

## 2023-10-27 DIAGNOSIS — Z8711 Personal history of peptic ulcer disease: Secondary | ICD-10-CM | POA: Diagnosis not present

## 2023-10-27 DIAGNOSIS — K5090 Crohn's disease, unspecified, without complications: Secondary | ICD-10-CM | POA: Diagnosis present

## 2023-10-27 DIAGNOSIS — E871 Hypo-osmolality and hyponatremia: Secondary | ICD-10-CM | POA: Diagnosis present

## 2023-10-27 DIAGNOSIS — Z886 Allergy status to analgesic agent status: Secondary | ICD-10-CM

## 2023-10-27 DIAGNOSIS — F10939 Alcohol use, unspecified with withdrawal, unspecified: Secondary | ICD-10-CM | POA: Diagnosis not present

## 2023-10-27 DIAGNOSIS — F1010 Alcohol abuse, uncomplicated: Secondary | ICD-10-CM | POA: Diagnosis not present

## 2023-10-27 DIAGNOSIS — M4802 Spinal stenosis, cervical region: Secondary | ICD-10-CM | POA: Diagnosis not present

## 2023-10-27 DIAGNOSIS — F17210 Nicotine dependence, cigarettes, uncomplicated: Secondary | ICD-10-CM | POA: Diagnosis present

## 2023-10-27 DIAGNOSIS — Z8349 Family history of other endocrine, nutritional and metabolic diseases: Secondary | ICD-10-CM

## 2023-10-27 DIAGNOSIS — M810 Age-related osteoporosis without current pathological fracture: Secondary | ICD-10-CM | POA: Diagnosis present

## 2023-10-27 DIAGNOSIS — R220 Localized swelling, mass and lump, head: Secondary | ICD-10-CM | POA: Diagnosis not present

## 2023-10-27 DIAGNOSIS — Z885 Allergy status to narcotic agent status: Secondary | ICD-10-CM

## 2023-10-27 DIAGNOSIS — G4730 Sleep apnea, unspecified: Secondary | ICD-10-CM | POA: Diagnosis present

## 2023-10-27 DIAGNOSIS — Z820 Family history of epilepsy and other diseases of the nervous system: Secondary | ICD-10-CM

## 2023-10-27 DIAGNOSIS — G9341 Metabolic encephalopathy: Secondary | ICD-10-CM | POA: Diagnosis not present

## 2023-10-27 DIAGNOSIS — G928 Other toxic encephalopathy: Secondary | ICD-10-CM | POA: Diagnosis present

## 2023-10-27 DIAGNOSIS — S199XXA Unspecified injury of neck, initial encounter: Secondary | ICD-10-CM | POA: Diagnosis not present

## 2023-10-27 DIAGNOSIS — K219 Gastro-esophageal reflux disease without esophagitis: Secondary | ICD-10-CM | POA: Diagnosis present

## 2023-10-27 DIAGNOSIS — R519 Headache, unspecified: Secondary | ICD-10-CM | POA: Diagnosis not present

## 2023-10-27 DIAGNOSIS — F10932 Alcohol use, unspecified with withdrawal with perceptual disturbance: Secondary | ICD-10-CM | POA: Diagnosis not present

## 2023-10-27 DIAGNOSIS — I10 Essential (primary) hypertension: Secondary | ICD-10-CM | POA: Diagnosis present

## 2023-10-27 DIAGNOSIS — Z8249 Family history of ischemic heart disease and other diseases of the circulatory system: Secondary | ICD-10-CM

## 2023-10-27 DIAGNOSIS — S161XXA Strain of muscle, fascia and tendon at neck level, initial encounter: Principal | ICD-10-CM

## 2023-10-27 DIAGNOSIS — Z79899 Other long term (current) drug therapy: Secondary | ICD-10-CM | POA: Diagnosis not present

## 2023-10-27 DIAGNOSIS — J4489 Other specified chronic obstructive pulmonary disease: Secondary | ICD-10-CM | POA: Diagnosis present

## 2023-10-27 DIAGNOSIS — G8929 Other chronic pain: Secondary | ICD-10-CM | POA: Diagnosis present

## 2023-10-27 DIAGNOSIS — E785 Hyperlipidemia, unspecified: Secondary | ICD-10-CM | POA: Diagnosis present

## 2023-10-27 DIAGNOSIS — R410 Disorientation, unspecified: Secondary | ICD-10-CM | POA: Diagnosis not present

## 2023-10-27 DIAGNOSIS — M545 Low back pain, unspecified: Secondary | ICD-10-CM

## 2023-10-27 DIAGNOSIS — M542 Cervicalgia: Secondary | ICD-10-CM | POA: Diagnosis not present

## 2023-10-27 DIAGNOSIS — E876 Hypokalemia: Secondary | ICD-10-CM | POA: Diagnosis present

## 2023-10-27 DIAGNOSIS — R6889 Other general symptoms and signs: Secondary | ICD-10-CM | POA: Diagnosis not present

## 2023-10-27 DIAGNOSIS — S0093XA Contusion of unspecified part of head, initial encounter: Secondary | ICD-10-CM | POA: Diagnosis not present

## 2023-10-27 LAB — PHOSPHORUS: Phosphorus: 3.3 mg/dL (ref 2.5–4.6)

## 2023-10-27 LAB — URINALYSIS, ROUTINE W REFLEX MICROSCOPIC
Bilirubin Urine: NEGATIVE
Glucose, UA: NEGATIVE mg/dL
Ketones, ur: NEGATIVE mg/dL
Leukocytes,Ua: NEGATIVE
Nitrite: NEGATIVE
Protein, ur: NEGATIVE mg/dL
Specific Gravity, Urine: 1.001 — ABNORMAL LOW (ref 1.005–1.030)
pH: 6 (ref 5.0–8.0)

## 2023-10-27 LAB — CBC
HCT: 40.9 % (ref 36.0–46.0)
Hemoglobin: 15.3 g/dL — ABNORMAL HIGH (ref 12.0–15.0)
MCH: 35.6 pg — ABNORMAL HIGH (ref 26.0–34.0)
MCHC: 37.4 g/dL — ABNORMAL HIGH (ref 30.0–36.0)
MCV: 95.1 fL (ref 80.0–100.0)
Platelets: 129 10*3/uL — ABNORMAL LOW (ref 150–400)
RBC: 4.3 MIL/uL (ref 3.87–5.11)
RDW: 12.6 % (ref 11.5–15.5)
WBC: 6.8 10*3/uL (ref 4.0–10.5)
nRBC: 0 % (ref 0.0–0.2)

## 2023-10-27 LAB — OSMOLALITY, URINE: Osmolality, Ur: 74 mosm/kg — ABNORMAL LOW (ref 300–900)

## 2023-10-27 LAB — BASIC METABOLIC PANEL WITH GFR
Anion gap: 13 (ref 5–15)
Anion gap: 10 (ref 5–15)
BUN: 14 mg/dL (ref 8–23)
BUN: 15 mg/dL (ref 8–23)
CO2: 22 mmol/L (ref 22–32)
CO2: 27 mmol/L (ref 22–32)
Calcium: 8.1 mg/dL — ABNORMAL LOW (ref 8.9–10.3)
Calcium: 8.3 mg/dL — ABNORMAL LOW (ref 8.9–10.3)
Chloride: 82 mmol/L — ABNORMAL LOW (ref 98–111)
Chloride: 85 mmol/L — ABNORMAL LOW (ref 98–111)
Creatinine, Ser: 0.69 mg/dL (ref 0.44–1.00)
Creatinine, Ser: 0.8 mg/dL (ref 0.44–1.00)
GFR, Estimated: >60 mL/min (ref >60)
GFR, Estimated: >60 mL/min (ref >60)
Glucose, Bld: 117 mg/dL — ABNORMAL HIGH (ref 70–99)
Glucose, Bld: 78 mg/dL (ref 70–99)
Potassium: 2.7 mmol/L — CL (ref 3.5–5.1)
Potassium: 3.4 mmol/L — ABNORMAL LOW (ref 3.5–5.1)
Sodium: 117 mmol/L — CL (ref 135–145)
Sodium: 122 mmol/L — ABNORMAL LOW (ref 135–145)

## 2023-10-27 LAB — MRSA NEXT GEN BY PCR, NASAL: MRSA by PCR Next Gen: NOT DETECTED

## 2023-10-27 LAB — SODIUM
Sodium: 122 mmol/L — ABNORMAL LOW (ref 135–145)
Sodium: 126 mmol/L — ABNORMAL LOW (ref 135–145)
Sodium: 130 mmol/L — ABNORMAL LOW (ref 135–145)
Sodium: 138 mmol/L (ref 135–145)
Sodium: 141 mmol/L (ref 135–145)
Sodium: 138 mmol/L (ref 135–145)

## 2023-10-27 LAB — OSMOLALITY: Osmolality: 255 mosm/kg — ABNORMAL LOW (ref 275–295)

## 2023-10-27 LAB — NA AND K (SODIUM & POTASSIUM), RAND UR
Potassium Urine: 7 mmol/L
Sodium, Ur: 13 mmol/L

## 2023-10-27 LAB — MAGNESIUM
Magnesium: 1.5 mg/dL — ABNORMAL LOW (ref 1.7–2.4)
Magnesium: 2.4 mg/dL (ref 1.7–2.4)

## 2023-10-27 MED ORDER — CHLORHEXIDINE GLUCONATE CLOTH 2 % EX PADS
6.0000 | MEDICATED_PAD | Freq: Every day | CUTANEOUS | Status: DC
  Administered 2023-10-28 – 2023-10-31 (×5): 6 via TOPICAL

## 2023-10-27 MED ORDER — LORAZEPAM 2 MG/ML IJ SOLN
1.0000 mg | INTRAMUSCULAR | Status: DC | PRN
  Administered 2023-10-27 – 2023-10-31 (×7): 1 mg via INTRAVENOUS
  Filled 2023-10-27 (×7): qty 1

## 2023-10-27 MED ORDER — ONDANSETRON HCL 4 MG/2ML IJ SOLN: 4.0000 mg | Freq: Four times a day (QID) | INTRAMUSCULAR | Status: DC | PRN

## 2023-10-27 MED ORDER — DOCUSATE SODIUM 100 MG PO CAPS
100.0000 mg | ORAL_CAPSULE | Freq: Two times a day (BID) | ORAL | Status: DC | PRN
  Administered 2023-10-30: 100 mg via ORAL
  Filled 2023-10-27 (×2): qty 1

## 2023-10-27 MED ORDER — SODIUM CHLORIDE 3 % IV SOLN
600.0000 mL/h | Freq: Once | INTRAVENOUS | Status: AC
  Administered 2023-10-27: 600 mL/h via INTRAVENOUS
  Filled 2023-10-27: qty 500

## 2023-10-27 MED ORDER — BUDESONIDE 0.25 MG/2ML IN SUSP
0.2500 mg | Freq: Two times a day (BID) | RESPIRATORY_TRACT | Status: DC
  Administered 2023-10-27 – 2023-10-31 (×9): 0.25 mg via RESPIRATORY_TRACT
  Filled 2023-10-27 (×9): qty 2

## 2023-10-27 MED ORDER — HALOPERIDOL LACTATE 5 MG/ML IJ SOLN
1.0000 mg | Freq: Four times a day (QID) | INTRAMUSCULAR | Status: DC | PRN
  Administered 2023-10-27 – 2023-10-31 (×2): 1 mg via INTRAVENOUS
  Filled 2023-10-27 (×2): qty 1

## 2023-10-27 MED ORDER — DEXTROSE 5 % IV BOLUS
250.0000 mL | Freq: Once | INTRAVENOUS | Status: AC
  Administered 2023-10-27: 250 mL via INTRAVENOUS

## 2023-10-27 MED ORDER — IPRATROPIUM-ALBUTEROL 0.5-2.5 (3) MG/3ML IN SOLN: 3.0000 mL | Freq: Four times a day (QID) | RESPIRATORY_TRACT | Status: DC | PRN

## 2023-10-27 MED ORDER — POTASSIUM CHLORIDE 20 MEQ PO PACK
60.0000 meq | PACK | ORAL | Status: AC
  Administered 2023-10-27: 60 meq via ORAL
  Filled 2023-10-27: qty 3

## 2023-10-27 MED ORDER — LACTATED RINGERS IV BOLUS
1000.0000 mL | Freq: Once | INTRAVENOUS | Status: AC
  Administered 2023-10-27: 1000 mL via INTRAVENOUS

## 2023-10-27 MED ORDER — ACETAMINOPHEN 325 MG PO TABS
650.0000 mg | ORAL_TABLET | ORAL | Status: DC | PRN
  Administered 2023-10-29 – 2023-10-30 (×3): 650 mg via ORAL
  Filled 2023-10-27 (×3): qty 2

## 2023-10-27 MED ORDER — POTASSIUM CHLORIDE 10 MEQ/100ML IV SOLN
10.0000 meq | INTRAVENOUS | Status: AC
  Administered 2023-10-27 (×2): 10 meq via INTRAVENOUS
  Filled 2023-10-27 (×2): qty 100

## 2023-10-27 MED ORDER — HEPARIN SODIUM (PORCINE) 5000 UNIT/ML IJ SOLN
5000.0000 [IU] | Freq: Three times a day (TID) | INTRAMUSCULAR | Status: DC
  Administered 2023-10-27 – 2023-10-31 (×11): 5000 [IU] via SUBCUTANEOUS
  Filled 2023-10-27 (×11): qty 1

## 2023-10-27 MED ORDER — FOLIC ACID 1 MG PO TABS
1.0000 mg | ORAL_TABLET | Freq: Every day | ORAL | Status: DC
  Administered 2023-10-27 – 2023-10-31 (×4): 1 mg via ORAL
  Filled 2023-10-27 (×4): qty 1

## 2023-10-27 MED ORDER — SODIUM CHLORIDE 0.9 % IV BOLUS: 1000.0000 mL | Freq: Once | INTRAVENOUS | Status: DC

## 2023-10-27 MED ORDER — THIAMINE HCL 100 MG/ML IJ SOLN
100.0000 mg | Freq: Every day | INTRAMUSCULAR | Status: DC
  Administered 2023-10-28: 100 mg via INTRAVENOUS
  Filled 2023-10-27 (×2): qty 2

## 2023-10-27 MED ORDER — DEXTROSE 5 % IV SOLN: INTRAVENOUS | Status: DC

## 2023-10-27 MED ORDER — THIAMINE MONONITRATE 100 MG PO TABS
100.0000 mg | ORAL_TABLET | Freq: Every day | ORAL | Status: DC
  Administered 2023-10-27 – 2023-10-31 (×4): 100 mg via ORAL
  Filled 2023-10-27 (×4): qty 1

## 2023-10-27 MED ORDER — MAGNESIUM SULFATE 2 GM/50ML IV SOLN
2.0000 g | Freq: Once | INTRAVENOUS | Status: AC
  Administered 2023-10-27: 2 g via INTRAVENOUS
  Filled 2023-10-27: qty 50

## 2023-10-27 MED ORDER — DESMOPRESSIN ACETATE 4 MCG/ML IJ SOLN
2.0000 ug | INTRAMUSCULAR | Status: AC
  Administered 2023-10-27: 2 ug via INTRAVENOUS
  Filled 2023-10-27: qty 0.5

## 2023-10-27 MED ORDER — ADULT MULTIVITAMIN W/MINERALS CH
1.0000 | ORAL_TABLET | Freq: Every day | ORAL | Status: DC
  Administered 2023-10-27 – 2023-10-31 (×4): 1 via ORAL
  Filled 2023-10-27 (×4): qty 1

## 2023-10-27 MED ORDER — FAMOTIDINE IN NACL 20-0.9 MG/50ML-% IV SOLN
20.0000 mg | INTRAVENOUS | Status: DC
  Administered 2023-10-27 – 2023-10-31 (×5): 20 mg via INTRAVENOUS
  Filled 2023-10-27 (×5): qty 50

## 2023-10-27 MED ORDER — PHENOBARBITAL SODIUM 130 MG/ML IJ SOLN
65.0000 mg | Freq: Three times a day (TID) | INTRAMUSCULAR | Status: DC
  Administered 2023-10-29 – 2023-10-31 (×6): 65 mg via INTRAVENOUS
  Filled 2023-10-27 (×6): qty 1

## 2023-10-27 MED ORDER — PHENOBARBITAL SODIUM 65 MG/ML IJ SOLN: 32.5000 mg | Freq: Three times a day (TID) | INTRAMUSCULAR | Status: DC

## 2023-10-27 MED ORDER — LORAZEPAM 2 MG/ML IJ SOLN
INTRAMUSCULAR | Status: AC
  Filled 2023-10-27: qty 1

## 2023-10-27 MED ORDER — POLYETHYLENE GLYCOL 3350 17 G PO PACK
17.0000 g | PACK | Freq: Every day | ORAL | Status: DC | PRN
Start: 2023-10-27 — End: 2023-10-31

## 2023-10-27 MED ORDER — DIPHENHYDRAMINE HCL 50 MG/ML IJ SOLN
25.0000 mg | Freq: Once | INTRAMUSCULAR | Status: AC
  Administered 2023-10-27: 25 mg via INTRAVENOUS
  Filled 2023-10-27: qty 1

## 2023-10-27 MED ORDER — PHENOBARBITAL SODIUM 130 MG/ML IJ SOLN
97.5000 mg | Freq: Three times a day (TID) | INTRAMUSCULAR | Status: DC
  Administered 2023-10-27 – 2023-10-29 (×6): 97.5 mg via INTRAVENOUS
  Filled 2023-10-27 (×6): qty 1

## 2023-10-27 MED ORDER — PROCHLORPERAZINE EDISYLATE 10 MG/2ML IJ SOLN
5.0000 mg | Freq: Once | INTRAMUSCULAR | Status: AC
  Administered 2023-10-27: 5 mg via INTRAVENOUS
  Filled 2023-10-27: qty 2

## 2023-10-27 MED ORDER — DEXMEDETOMIDINE HCL IN NACL 400 MCG/100ML IV SOLN
0.0000 ug/kg/h | INTRAVENOUS | Status: DC
  Administered 2023-10-27: 0.4 ug/kg/h via INTRAVENOUS
  Administered 2023-10-28: 0.9 ug/kg/h via INTRAVENOUS
  Administered 2023-10-28: 0.6 ug/kg/h via INTRAVENOUS
  Administered 2023-10-29: 1.2 ug/kg/h via INTRAVENOUS
  Administered 2023-10-29: 0.5 ug/kg/h via INTRAVENOUS
  Administered 2023-10-30: 0.9 ug/kg/h via INTRAVENOUS
  Administered 2023-10-30: 0.4 ug/kg/h via INTRAVENOUS
  Administered 2023-10-31: 0.6 ug/kg/h via INTRAVENOUS
  Filled 2023-10-27 (×8): qty 100

## 2023-10-27 NOTE — ED Notes (Signed)
 Called to room by ED tech, pt shaking in bed and is unrsponsive to painful stimuli, pt turned onto side and called ED provider to bedside.

## 2023-10-27 NOTE — ED Provider Notes (Signed)
  Physical Exam  BP 114/83   Pulse 83   Temp 98.2 F (36.8 C) (Oral)   Resp 12   SpO2 96%   Physical Exam  Procedures  Procedures  ED Course / MDM   Clinical Course as of 10/27/23 1150  Mon Oct 27, 2023  9245 Assumed care from Dr Theadore. 68 yo F hx of crohns who had a witness syncopal episode was drawing her arms in and eyes were rolling back. Did hit her head. At her baseline at this time. Has a Na of 117 and K of 2.7. CT head/c-spine without acute findings. Appears euvolemic. Will admit.  [RP]  [phone] Dr Vicci from hospitalist is consulted. Recommends calling back after a repeat chemistry.  [RP]  [phone] Patient just had a seizure that lasted a minute and a half.  Aborted with 2 mg of Ativan.  Patient postictal.  Hypertonic saline ordered. [RP]  9062 Dr Pierce consulted.  And will see the patient shortly. [RP]  1029 Patient's husband has arrived.  Reports that the patient drinks 15 beers per day and occasionally some wine.  Suspect this is likely the cause of her hyponatremia. [RP]  1102 Due to the patient's seizure and concern for possible severe alcohol withdrawals I did discuss with Dr Kara from [facility] ICU. Feels that pt can stay at [facility]. Recommends phenobarbital IV taper to treat alcohol withdrawal and prevent seizures. [RP]  1148 Dr Ricky from hospitalist consulted for admission. [RP]    Clinical Course User Index [RP] Yolande Lamar BROCKS, MD   Medical Decision Making Amount and/or Complexity of Data Reviewed Labs: ordered. Radiology: ordered. ECG/medicine tests: ordered.  Risk OTC drugs. Prescription drug management. Decision regarding hospitalization.   CRITICAL CARE Performed by: Lamar BROCKS Yolande   Total critical care time: 45 minutes  Critical care time was exclusive of separately billable procedures and treating other patients.  Critical care was necessary to treat or prevent imminent or life-threatening deterioration.  Critical care was time  spent personally by me on the following activities: development of treatment plan with patient and/or surrogate as well as nursing, discussions with consultants, evaluation of patient's response to treatment, examination of patient, obtaining history from patient or surrogate, ordering and performing treatments and interventions, ordering and review of laboratory studies, ordering and review of radiographic studies, pulse oximetry and re-evaluation of patient's condition.        Yolande Lamar BROCKS, MD 10/27/23 1150

## 2023-10-27 NOTE — Progress Notes (Addendum)
 Patient became severely agitated, pulling out contraptions and trying to get out of the bed continuously even after giving PRN Ativan and Phenobarb IV as scheduled.  MD informed. Started on Precedex gtt

## 2023-10-27 NOTE — Consult Note (Signed)
 [name] [name] Admit Date: 10/27/2023 10/27/2023 [name] [name] Requesting Physician:  Yolande MD  Reason for Consult:  Seizure, Hyponatremia  HPI:  49F PMH including heavy ETOH use (including up to time of admission, 12-15 beers a day and 2 to 3 glasses of wine) ongoing tobacco use, otherwise as below who presented to the ED after a fall at home.  Husband is at bedside providing history.  Patient is encephalopathic and unable to provide history.  Husband checked on patient around 3:30 AM and as she was getting into bed she grabbed her head and fell to the floor with rhythmic shaking activities, jerking eyes, and EMS was called and brought to the ED.  Workup in the ED revealed a sodium of 117, potassium of 2.7, magnesium  of 1.5.  Later in the morning the patient had a second seizure, witnessed and received Ativan.  Upon arrival in the ED the patient received 1 L of normal saline.  I was called after the second seizure in the ED at which point decision was made to give 3% hypertonic saline.  Serum sodium at 9 AM now up to 122.  Patient is encephalopathic but awakens to stimuli and eventually tells me her name, otherwise mumbles incoherently.  Husband tells me that she is never had a seizure before.  No history of severe hyponatremia.  She eats next and nothing he tells me, especially in the past few days.  She does not use thiazide diuretics.  CT of the head upon arrival had no intracranial abnormality with a left parietal scalp soft tissue swelling and hematoma.  ROS Unable to complete a review of systems because of patient's encephalopathy.  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   Crohn disease (HCC)    CTS (carpal tunnel syndrome)    Diarrhea MULITIFACTORIAL   IBS, LACTOSE INTOLERANCE, SBBO,  BILE-SALT   Elevated liver enzymes 2009: BMI 24 ?Etoh ALT 94, AST 51,  NEG ANA, qIGs& ASMA   MAY 2012 AST 52 ALT 38   Esophageal stricture 2009   GERD (gastroesophageal reflux disease)    Hemorrhoid    Hyperlipemia    Hypertension    Inflammatory bowel disease 2006 CD   SURGICAL REMISSION   LBP (low back pain)    Mental disorder    Migraine    Osteoporosis    PONV (postoperative nausea and vomiting)    Shortness of breath    exertion and humidity   Shoulder pain    right   Sleep apnea    Stop Bang Cock score of 4   PSH  Past Surgical History:  Procedure Laterality Date   BACK SURGERY     BACK SURGERY  07/16/2018   BILATERAL SALPINGOOPHORECTOMY     BIOPSY  12/24/2011   Procedure: BIOPSY;  Surgeon: Margo LITTIE Haddock, MD;  Location: [facility];  Service: Endoscopy;;  Gastric Biopsies   BIOPSY N/A 06/30/2012   Procedure: BIOPSY;  Surgeon: Margo LITTIE Haddock, MD;  Location: [facility];  Service: Endoscopy;  Laterality: N/A;  Gastric and Esophageal Biopsies   BIOPSY  06/23/2023   Procedure: BIOPSY;  Surgeon: Cindie Carlin POUR, DO;  [facility];  Service: Endoscopy;;   CARPAL TUNNEL RELEASE  LEFT   CATARACT EXTRACTION W/PHACO Right 07/30/2016   Procedure: CATARACT EXTRACTION PHACO AND INTRAOCULAR LENS PLACEMENT (IOC);  Surgeon: Oneil Platts, MD;  Location: [facility];  Service: Ophthalmology;  Laterality: Right;  CDE: 5.45   CATARACT EXTRACTION W/PHACO Left 08/13/2016   Procedure: CATARACT EXTRACTION PHACO AND INTRAOCULAR LENS PLACEMENT (IOC);  Surgeon: Oneil Platts, MD;  Location: [facility];  Service: Ophthalmology;  Laterality: Left;  CDE: 5.59   COLON SURGERY     COLONOSCOPY  JAN 2009 DIARRHEA, ABD PAIN, BRBPR   ANASTOMOTIC ULCER(3MM), IH Ak:AZWPHW ULCER, NL COLON bX   COLONOSCOPY WITH PROPOFOL  N/A 04/21/2018   examined portion of the ileum normal, two 3-5 mm polyps, diverticulosis in the rectosigmoid and sigmoid colon, external and internal hemorrhoids, significant looping of the colon.   Pathology with tubular adenomas.  Recommendations to follow high-fiber diet and repeat colonoscopy in 5 to 10 years with peds colonoscope.   COLONOSCOPY WITH PROPOFOL  N/A 06/23/2023   Procedure: COLONOSCOPY WITH PROPOFOL ;  Surgeon: Cindie Carlin POUR, DO;  [facility];  Service: Endoscopy;  Laterality: N/A;  10:00 am, asa 3   DILATION AND CURETTAGE OF UTERUS     ESOPHAGEAL DILATION  12/24/2011   SLF: A stricture was found in the distal esophagus/ Moderate gastritis/ MILD Duodenitis   ESOPHAGEAL DILATION N/A 06/23/2023   Procedure: ESOPHAGEAL DILATION;  Surgeon: Cindie Carlin POUR, DO;  [facility];  Service: Endoscopy;  Laterality: N/A;   ESOPHAGOGASTRODUODENOSCOPY  02/07/09   mild gastritis   ESOPHAGOGASTRODUODENOSCOPY N/A 09/11/2023   Procedure: EGD (ESOPHAGOGASTRODUODENOSCOPY);  Surgeon: Cindie Carlin POUR, DO;  [facility];  Service: Endoscopy;  Laterality: N/A;  8:15 AM, ASA 3   ESOPHAGOGASTRODUODENOSCOPY (EGD) WITH PROPOFOL  N/A 06/30/2012   SLF: 1. No definite stricture appreciated 2. small hiatal hernia 3. Gastritis   ESOPHAGOGASTRODUODENOSCOPY (EGD) WITH PROPOFOL  N/A 02/20/2016   Procedure: ESOPHAGOGASTRODUODENOSCOPY (EGD) WITH PROPOFOL ;  Surgeon: Margo LITTIE Haddock, MD;  [facility];  Service: Endoscopy;  Laterality: N/A;  930   ESOPHAGOGASTRODUODENOSCOPY (EGD) WITH PROPOFOL  N/A 11/24/2018   benign-appearing esophageal peptic stricture s/p dilation, small hiatal hernia, moderate erosive gastritis.    ESOPHAGOGASTRODUODENOSCOPY (EGD) WITH PROPOFOL  N/A 06/23/2023   Procedure: ESOPHAGOGASTRODUODENOSCOPY (EGD) WITH PROPOFOL ;  Surgeon: Cindie Carlin POUR, DO;  [facility];  Service: Endoscopy;  Laterality: N/A;   EYE SURGERY     gum flap Bilateral    HEMICOLECTOMY  RIGHT 2006   HERNIA REPAIR     LUMBAR DISC SURGERY     POLYPECTOMY  04/21/2018   Procedure: POLYPECTOMY;  Surgeon: Haddock Margo LITTIE, MD;  [facility];  Service:  Endoscopy;;  (colon)   SAVORY DILATION N/A 06/30/2012   Procedure: SAVORY DILATION;  Surgeon: Margo LITTIE Haddock, MD;  Location: [facility];  Service: Endoscopy;  Laterality: N/A;  12.87mm , 14mm, 15mm   SAVORY DILATION N/A 02/20/2016   Procedure: SAVORY DILATION;  Surgeon: Margo LITTIE Haddock, MD;  [facility];  Service: Endoscopy;  Laterality: N/A;   SAVORY DILATION N/A 11/24/2018   Procedure: SAVORY DILATION;  Surgeon: Haddock Margo LITTIE, MD;  [facility];  Service: Endoscopy;  Laterality: N/A;   UPPER GASTROINTESTINAL ENDOSCOPY  JAN 2009 ABD PAIN   GASTRITIS, ESO RING   UPPER GASTROINTESTINAL ENDOSCOPY  OCT 2010 ABD PAIN, DYSPHAGIA   DIL , GASTRITIS, NL DUODENUM   UPPER GASTROINTESTINAL ENDOSCOPY  OCT 2010 DYSPHAGIA   DIL 17 MM, GASTRITIS, NL DUODENUM   vocal  cord surgery  Sep 20, 2010   precancerous areas removed   wisdom tooth extraction Right    pin placement due to broken jaw   FH  Family History  Problem Relation Age of Onset   Hypothyroidism Mother    Heart disease Father    Parkinson's disease Father    Hypothyroidism Daughter    Heart attack Paternal Grandfather    Alzheimer's disease Paternal Grandmother    Heart attack Maternal Grandfather    Crohn's disease Sister    Other Sister        was murdered   Crohn's disease Maternal Uncle    Colon cancer Neg Hx    Colon polyps Neg Hx    SH  reports that she has been smoking cigarettes. She has a 7.5 pack-year smoking history. She has never used smokeless tobacco. She reports that she does not currently use alcohol. She reports that she does not use drugs. Allergies  Allergies  Allergen Reactions   Naproxen Shortness Of Breath    Chest pain   Dicyclomine  Hcl Other (See Comments)    Vision changes   Duloxetine Other (See Comments)    Suicidal    Egg-Derived Products Diarrhea    abd pain   Lovastatin Other (See Comments)    Chest pain   Toradol  [Ketorolac  Tromethamine ] Other (See Comments)    Headache.  Non-effective   Tramadol Other (See Comments)    Headache. Non-effective.   Nexium  [Esomeprazole  Magnesium ] Diarrhea    Abdominal pain   Home medications Prior to Admission medications   Medication Sig Start Date End Date Taking? Authorizing Provider  amLODipine  (NORVASC ) 10 MG tablet Take 1 tablet (10 mg total) by mouth daily. 08/15/23  Yes Melvenia Manus BRAVO, MD  benzonatate  (TESSALON ) 100 MG capsule Take 1 capsule by mouth twice daily 09/25/23  Yes Dixon, Phillip E, MD  Buprenorphine HCl-Naloxone HCl 4-1 MG FILM Place 1 Film under the tongue 4 (four) times daily. 07/23/23  Yes [provider]  calcium  carbonate (TUMS - DOSED IN MG ELEMENTAL CALCIUM ) 500 MG chewable tablet Chew 1 tablet (200 mg of elemental calcium  total) by mouth 3 (three) times daily with meals. 03/02/19  Yes Nida, Ethelle ORN, MD  clotrimazole  (CLOTRIMAZOLE  AF) 1 % cream Apply 1 application topically 2 (two) times daily. To affected area for 1-2 weeks 01/04/20  Yes [place], Theodoro FALCON, MD  cyclobenzaprine  (FLEXERIL ) 5 MG tablet Take 1 tablet (5 mg total) by mouth 3 (three) times daily as needed for muscle spasms. 10/02/23  Yes Melvenia Manus BRAVO, MD  dexlansoprazole  (DEXILANT ) 60 MG capsule Take 1 capsule (60 mg total) by mouth daily. 06/04/23 06/03/24 Yes Carver, Carlin POUR, DO  gabapentin  (NEURONTIN ) 300 MG capsule Take 300 mg by mouth every 8 (eight) hours. 09/16/23  Yes [provider]  lidocaine  (LIDODERM ) 5 % Place 1 patch onto the skin daily. 05/19/23  Yes [provider]  magnesium  hydroxide (MILK OF MAGNESIA) 400 MG/5ML suspension Take 30 mLs by mouth daily as needed for mild constipation.   Yes [provider]  nystatin  (MYCOSTATIN ) 100000 UNIT/ML suspension TAKE 5 MLS BY MOUTH 4 TIMES DAILY AS NEEDED. 09/25/23  Yes Melvenia Manus BRAVO, MD  SYMBICORT  80-4.5 MCG/ACT inhaler Inhale 2 puffs by mouth twice daily Patient taking differently: Inhale 2 puffs into the lungs daily. 05/06/23  Yes Melvenia Manus BRAVO, MD  triamcinolone  (KENALOG ) 0.1 % Apply 1 application topically 2 (two) times daily. 07/28/20  Yes Tobie Suzzane POUR, MD  VENTOLIN  HFA 108 (90 Base) MCG/ACT inhaler INHALE 2 PUFFS BY MOUTH EVERY 6 HOURS AS NEEDED FOR WHEEZING FOR SHORTNESS OF BREATH 07/08/23  Yes Melvenia Manus BRAVO, MD    Current Medications Scheduled Meds:  folic acid   1 mg Oral Daily   multivitamin with minerals  1 tablet Oral Daily   thiamine  100 mg Oral Daily   Or   thiamine  100 mg Intravenous Daily   Continuous Infusions:  magnesium  sulfate bolus IVPB 2 g (10/27/23 1008)   potassium chloride  10 mEq (10/27/23 1007)   PRN Meds:.  CBC Recent Labs  Lab 10/27/23 0618  WBC 6.8  HGB 15.3*  HCT 40.9  MCV 95.1  PLT 129*   Basic Metabolic Panel Recent Labs  Lab 10/27/23 0618 10/27/23 0857 10/27/23 0921  NA 117* 122* 122*  K 2.7* 3.4*  --   CL 82* 85*  --   CO2 22 27  --   GLUCOSE 117* 78  --   BUN 14 15  --   CREATININE 0.69 0.80  --   CALCIUM  8.1* 8.3*  --     Physical Exam  Blood pressure 114/83, pulse 83, temperature 98.2 F (36.8 C), temperature source Oral, resp. rate 12, SpO2 96%. GEN: Female, lying in stretcher, unable to coherently answer questions, mumbles when asked, eventually tells me her name, otherwise not oriented.  Moves all extremities. ENT: NCAT, scalp hematoma noted CV: Regular, normal S1 and S2 PULM: Clear bilaterally ABD: Soft, nontender SKIN: No rashes or lesions EXT: No edema NEURO: Nonfocal, awakens to physical stimuli, oriented to self only as above.  Moves all extremities.  Assessment 37F with acute severe hyponatremia and seizure in the setting of heavy alcohol intake.  Acute severe presumed hypotonic euvolemic hyponatremia: Given history of this is almost certainly low solute intake / potomania.  Presented with  generalized seizure x2 and received 1 L LR and then after second seizure141mL of 3% saline.  Seizure x2 from #1 Heavy alcohol intake, 12-15 beers and 2  to 3 glasses of wine per day Ongoing tobacco user Hypokalemia Hypomagnesemia  Plan Sodium has corrected from 117-122 which is quite reasonable with crystalloid and hypertonic saline..  Goal would be not to exceed 125 over the course of the rest of today up until 6 AM tomorrow.   If sodium exceeds 125 then we will need to use D5W to slow pace of recovery Not safe for taking p.o.  Would not give any other IV fluids and will see how sodium levels trend Continue every 2 hour sodium checks Continue repletion of potassium and magnesium  Seizure management per primary, likely will resolve as hyponatremia improves Will need to stop using alcohol upon discharge for best outcomes   [name] KATHEE Gasman, MD  10/27/2023, 11:00 AM

## 2023-10-27 NOTE — ED Notes (Signed)
 Per EDP, give the hypertonic solution in 10 minutes, even though IV pump gave error message when trying to start it.

## 2023-10-27 NOTE — H&P (Signed)
 History and Physical    Patient: [name] [name] FMW:[id number] DOB: 09/24/1955 DOA: 10/27/2023 DOS: the patient was seen and examined on 10/27/2023 PCP: Tobie Suzzane POUR, MD   Patient coming from: Home  Chief Complaint:  Chief Complaint  Patient presents with   Fall   HPI: [name] [name] is a 68 y.o. female with medical history significant of GERD, history of asthma/COPD, GERD, alcohol abuse, hyperlipidemia and history of inflammatory bowel disease; who presented to the hospital secondary to fall associated with syncope.  Patient ended up striking the back of her head and was complaining of left-sided headache. Acute images demonstrating no acute intracranial abnormalities.  Further workup showing significant hyponatremia and a potassium of 2.7  Per the husband's report patient drinking over 15 beers per day and sometimes also having some wine.  While in the ED patient experience seizure activity that was aborted with the use of Ativan.  Case discussed with nephrology and critical care; patient received hypertonic saline and was started on phenobarbital. Electrolytes started to be repleted and despite being postictal patient hemodynamically stable and protecting airways.  TRH consulted to place in the hospital for further evaluation and management.  Review of Systems: unable to review all systems due to the inability of the patient to answer questions. 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   Crohn disease (HCC)    CTS (carpal tunnel syndrome)    Diarrhea MULITIFACTORIAL   IBS, LACTOSE INTOLERANCE, SBBO, BILE-SALT   Elevated liver enzymes 2009: BMI 24 ?Etoh ALT 94, AST 51,  NEG ANA, qIGs& ASMA   MAY 2012 AST 52 ALT 38   Esophageal stricture 2009   GERD (gastroesophageal reflux disease)     Hemorrhoid    Hyperlipemia    Hypertension    Inflammatory bowel disease 2006 CD   SURGICAL REMISSION   LBP (low back pain)    Mental disorder    Migraine    Osteoporosis    PONV (postoperative nausea and vomiting)    Shortness of breath    exertion and humidity   Shoulder pain    right   Sleep apnea    Stop Bang Cock score of 4   Past Surgical History:  Procedure Laterality Date   BACK SURGERY     BACK SURGERY  07/16/2018   BILATERAL SALPINGOOPHORECTOMY     BIOPSY  12/24/2011   Procedure: BIOPSY;  Surgeon: Margo LITTIE Haddock, MD;  Location: [facility];  Service: Endoscopy;;  Gastric Biopsies   BIOPSY N/A 06/30/2012   Procedure: BIOPSY;  Surgeon: Margo LITTIE Haddock, MD;  Location: [facility];  Service: Endoscopy;  Laterality: N/A;  Gastric and Esophageal Biopsies   BIOPSY  06/23/2023   Procedure: BIOPSY;  Surgeon: Cindie Carlin POUR, DO;  [facility]: [facility];  Service: Endoscopy;;   CARPAL TUNNEL RELEASE  LEFT   CATARACT EXTRACTION W/PHACO Right 07/30/2016   Procedure: CATARACT EXTRACTION PHACO AND INTRAOCULAR LENS PLACEMENT (IOC);  Surgeon: Oneil Platts, MD;  Location: [facility];  Service: Ophthalmology;  Laterality: Right;  CDE: 5.45   CATARACT EXTRACTION W/PHACO Left 08/13/2016   Procedure: CATARACT EXTRACTION PHACO AND INTRAOCULAR LENS PLACEMENT (IOC);  Surgeon: Oneil Platts, MD;  Location: [facility];  Service: Ophthalmology;  Laterality:  Left;  CDE: 5.59   COLON SURGERY     COLONOSCOPY  JAN 2009 DIARRHEA, ABD PAIN, BRBPR   ANASTOMOTIC ULCER(3MM), IH Ak:AZWPHW ULCER, NL COLON bX   COLONOSCOPY WITH PROPOFOL  N/A 04/21/2018   examined portion of the ileum normal, two 3-5 mm polyps, diverticulosis in the rectosigmoid and sigmoid colon, external and internal hemorrhoids, significant looping of the colon.  Pathology with tubular adenomas.  Recommendations to follow high-fiber diet and repeat colonoscopy in 5 to 10 years with peds colonoscope.   COLONOSCOPY WITH PROPOFOL  N/A 06/23/2023   Procedure:  COLONOSCOPY WITH PROPOFOL ;  Surgeon: Cindie Carlin POUR, DO;  [facility]: [facility];  Service: Endoscopy;  Laterality: N/A;  10:00 am, asa 3   DILATION AND CURETTAGE OF UTERUS     ESOPHAGEAL DILATION  12/24/2011   SLF: A stricture was found in the distal esophagus/ Moderate gastritis/ MILD Duodenitis   ESOPHAGEAL DILATION N/A 06/23/2023   Procedure: ESOPHAGEAL DILATION;  Surgeon: Cindie Carlin POUR, DO;  [facility]: [facility];  Service: Endoscopy;  Laterality: N/A;   ESOPHAGOGASTRODUODENOSCOPY  02/07/09   mild gastritis   ESOPHAGOGASTRODUODENOSCOPY N/A 09/11/2023   Procedure: EGD (ESOPHAGOGASTRODUODENOSCOPY);  Surgeon: Cindie Carlin POUR, DO;  [facility]: [facility];  Service: Endoscopy;  Laterality: N/A;  8:15 AM, ASA 3   ESOPHAGOGASTRODUODENOSCOPY (EGD) WITH PROPOFOL  N/A 06/30/2012   SLF: 1. No definite stricture appreciated 2. small hiatal hernia 3. Gastritis   ESOPHAGOGASTRODUODENOSCOPY (EGD) WITH PROPOFOL  N/A 02/20/2016   Procedure: ESOPHAGOGASTRODUODENOSCOPY (EGD) WITH PROPOFOL ;  Surgeon: Margo LITTIE Haddock, MD;  [facility]: [facility];  Service: Endoscopy;  Laterality: N/A;  930   ESOPHAGOGASTRODUODENOSCOPY (EGD) WITH PROPOFOL  N/A 11/24/2018   benign-appearing esophageal peptic stricture s/p dilation, small hiatal hernia, moderate erosive gastritis.    ESOPHAGOGASTRODUODENOSCOPY (EGD) WITH PROPOFOL  N/A 06/23/2023   Procedure: ESOPHAGOGASTRODUODENOSCOPY (EGD) WITH PROPOFOL ;  Surgeon: Cindie Carlin POUR, DO;  [facility]: [facility];  Service: Endoscopy;  Laterality: N/A;   EYE SURGERY     gum flap Bilateral    HEMICOLECTOMY  RIGHT 2006   HERNIA REPAIR     LUMBAR DISC SURGERY     POLYPECTOMY  04/21/2018   Procedure: POLYPECTOMY;  Surgeon: Haddock Margo LITTIE, MD;  Location: [facility];  Service: Endoscopy;;  (colon)   SAVORY DILATION N/A 06/30/2012   Procedure: SAVORY DILATION;  Surgeon: Margo LITTIE Haddock, MD;  Location: [facility];  Service: Endoscopy;  Laterality: N/A;  12.46mm , 14mm, 15mm   SAVORY  DILATION N/A 02/20/2016   Procedure: SAVORY DILATION;  Surgeon: Margo LITTIE Haddock, MD;  [facility]: [facility];  Service: Endoscopy;  Laterality: N/A;   SAVORY DILATION N/A 11/24/2018   Procedure: SAVORY DILATION;  Surgeon: Haddock Margo LITTIE, MD;  [facility]: [facility];  Service: Endoscopy;  Laterality: N/A;   UPPER GASTROINTESTINAL ENDOSCOPY  JAN 2009 ABD PAIN   GASTRITIS, ESO RING   UPPER GASTROINTESTINAL ENDOSCOPY  OCT 2010 ABD PAIN, DYSPHAGIA   DIL , GASTRITIS, NL DUODENUM   UPPER GASTROINTESTINAL ENDOSCOPY  OCT 2010 DYSPHAGIA   DIL 17 MM, GASTRITIS, NL DUODENUM   vocal cord surgery  Sep 20, 2010   precancerous areas removed   wisdom tooth extraction Right    pin placement due to broken jaw   Social History:  reports that she has been smoking cigarettes. She has a 7.5 pack-year smoking history. She has never used smokeless tobacco. She reports that she does not currently use alcohol. She reports that she does not use drugs.  Allergies  Allergen Reactions   Naproxen Shortness Of Breath    Chest pain   Dicyclomine  Hcl Other (See Comments)    Vision changes   Duloxetine Other (See Comments)    Suicidal    Egg-Derived Products Diarrhea    abd pain   Lovastatin Other (See Comments)    Chest pain   Toradol  [Ketorolac  Tromethamine ] Other (See Comments)    Headache. Non-effective   Tramadol Other (See Comments)    Headache. Non-effective.   Nexium  [Esomeprazole  Magnesium ] Diarrhea    Abdominal pain    Family History  Problem Relation Age of Onset   Hypothyroidism Mother    Heart disease Father    Parkinson's disease Father    Hypothyroidism Daughter    Heart attack Paternal Grandfather    Alzheimer's disease Paternal Grandmother    Heart attack Maternal Grandfather    Crohn's disease Sister    Other Sister        was murdered   Crohn's disease Maternal Uncle    Colon cancer Neg Hx    Colon polyps Neg Hx     Prior to Admission medications   Medication Sig Start Date  End Date Taking? Authorizing Provider  amLODipine  (NORVASC ) 10 MG tablet Take 1 tablet (10 mg total) by mouth daily. 08/15/23  Yes Melvenia Manus BRAVO, MD  benzonatate  (TESSALON ) 100 MG capsule Take 1 capsule by mouth twice daily 09/25/23  Yes Dixon, Phillip E, MD  Buprenorphine HCl-Naloxone HCl 4-1 MG FILM Place 1 Film under the tongue 4 (four) times daily. 07/23/23  Yes [provider]  calcium  carbonate (TUMS - DOSED IN MG ELEMENTAL CALCIUM ) 500 MG chewable tablet Chew 1 tablet (200 mg of elemental calcium  total) by mouth 3 (three) times daily with meals. 03/02/19  Yes Nida, Ethelle ORN, MD  clotrimazole  (CLOTRIMAZOLE  AF) 1 % cream Apply 1 application topically 2 (two) times daily. To affected area for 1-2 weeks 01/04/20  Yes Shawnee, Theodoro FALCON, MD  cyclobenzaprine  (FLEXERIL ) 5 MG tablet Take 1 tablet (5 mg total) by mouth 3 (three) times daily as needed for muscle spasms. 10/02/23  Yes Melvenia Manus BRAVO, MD  dexlansoprazole  (DEXILANT ) 60 MG capsule Take 1 capsule (60 mg total) by mouth daily. 06/04/23 06/03/24 Yes Carver, Carlin POUR, DO  gabapentin  (NEURONTIN ) 300 MG capsule Take 300 mg by mouth every 8 (eight) hours. 09/16/23  Yes [provider]  lidocaine  (LIDODERM ) 5 % Place 1 patch onto the skin daily. 05/19/23  Yes [provider]  magnesium  hydroxide (MILK OF MAGNESIA) 400 MG/5ML suspension Take 30 mLs by mouth daily as needed for mild constipation.   Yes [provider]  nystatin  (MYCOSTATIN ) 100000 UNIT/ML suspension TAKE 5 MLS BY MOUTH 4 TIMES DAILY AS NEEDED. 09/25/23  Yes Melvenia Manus BRAVO, MD  SYMBICORT  80-4.5 MCG/ACT inhaler Inhale 2 puffs by mouth twice daily Patient taking differently: Inhale 2 puffs into the lungs daily. 05/06/23  Yes Melvenia Manus BRAVO, MD  triamcinolone  (KENALOG ) 0.1 % Apply 1 application topically 2 (two) times daily. 07/28/20  Yes Tobie Suzzane POUR, MD  VENTOLIN  HFA 108 [phone] Base) MCG/ACT inhaler INHALE 2 PUFFS BY MOUTH EVERY 6 HOURS AS NEEDED  FOR WHEEZING FOR SHORTNESS OF BREATH 07/08/23  Yes Melvenia Manus BRAVO, MD    Physical Exam: Vitals:   10/27/23 1400 10/27/23 1500 10/27/23 1600 10/27/23 1700  BP:  96/68 (!) 97/59 (!) 96/49  Pulse: 88 76  77  Resp: (!) 25 12 (!) 23 14  Temp:  98.7 F (37.1 C)  TempSrc:    Axillary  SpO2: 98% 97% 92% 97%   General exam: Oriented x 1 and having difficulty following commands.  Protecting airways, no fever, no nausea or vomiting. Respiratory system: Good saturation on room air.  No using accessory muscles. Cardiovascular system: No rubs, no gallops, no JVD.  Rate controlled. Gastrointestinal system: Abdomen is nondistended, soft and nontender.  Positive bowel sounds. Central nervous system: Unable to properly assess given current mental status. Extremities: No cyanosis or clubbing. Skin: No petechiae. Psychiatry: Unable to assess given current mental status.  Data Reviewed: Basic metabolic panel: Sodium 117, potassium 2.7, chloride 82, BUN 14, creatinine 0.69 and GFR >60 CBC: White blood cell 6.8, hemoglobin 15.3, platelet count 129K Magnesium  1.5 Osmolality: 255  Assessment and Plan: 1-acute metabolic encephalopathy/seizures -In the setting of alcohol abuse and decreased oral intake most likely. - Initially received hypertonic saline and fluid resuscitation - Patient's sodium up to 122 >>126 - Nephrology service recommending the use of D5W to further slow down fast repletion - Continue to follow electrolytes trend - Following critical care recommendations continue phenobarbital detox protocol (will help with withdrawal and also preventing further seizures).  2-hypokalemia/hypomagnesemia - Replete electrolytes and follow trend - Most likely associated with alcohol consumption.  3-alcohol abuse/withdrawal/agitation - Phenobarbital protocol initiated - Patient also receiving as needed Ativan - Thiamine and folic acid  provided - Once improved mentation achieved cessation  counseling will be provided. - As needed Haldol will also be provided.  4-GERD - Continue Pepcid  - As needed antiemetics will be also provided.  5-history of asthma/COPD - No acute exacerbation appreciated - Continue treatment with Pulmicort  and as needed DuoNeb.    Advance Care Planning:   Code Status: Full Code   Consults: Critical care, nephrology service.  Family Communication: Husband at bedside.  Severity of Illness: The appropriate patient status for this patient is INPATIENT. Inpatient status is judged to be reasonable and necessary in order to provide the required intensity of service to ensure the patient's safety. The patient's presenting symptoms, physical exam findings, and initial radiographic and laboratory data in the context of their chronic comorbidities is felt to place them at high risk for further clinical deterioration. Furthermore, it is not anticipated that the patient will be medically stable for discharge from the hospital within 2 midnights of admission.   * I certify that at the point of admission it is my clinical judgment that the patient will require inpatient hospital care spanning beyond 2 midnights from the point of admission due to high intensity of service, high risk for further deterioration and high frequency of surveillance required.*  Author: Eric Nunnery, MD 10/27/2023 6:57 PM  For on call review [email or web address].

## 2023-10-27 NOTE — ED Provider Notes (Signed)
 [facility] Emergency Department Provider Note MRN:  [id number]  Arrival date & time: 10/27/23     Chief Complaint   Fall   History of Present Illness   [name] [name] is a 68 y.o. year-old female with a history of COPD, Crohn's presenting to the ED with chief complaint of fall.  Witnessed syncopal episode, falling backward and striking the back of her head.  Endorsing acute on chronic neck pain from the fall, left-sided headache.  Denies chest pain or shortness of breath, no abdominal pain, no back pain, no injuries or numbness or weakness to the arms or legs.  Review of Systems  A thorough review of systems was obtained and all systems are negative except as noted in the HPI and PMH.   Patient's Health History    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   Crohn disease (HCC)    CTS (carpal tunnel syndrome)    Diarrhea MULITIFACTORIAL   IBS, LACTOSE INTOLERANCE, SBBO, BILE-SALT   Elevated liver enzymes 2009: BMI 24 ?Etoh ALT 94, AST 51,  NEG ANA, qIGs& ASMA   MAY 2012 AST 52 ALT 38   Esophageal stricture 2009   GERD (gastroesophageal reflux disease)    Hemorrhoid    Hyperlipemia    Hypertension    Inflammatory bowel disease 2006 CD   SURGICAL REMISSION   LBP (low back pain)    Mental disorder    Migraine    Osteoporosis    PONV (postoperative nausea and vomiting)    Shortness of breath    exertion and humidity   Shoulder pain    right   Sleep apnea    Stop Bang Cock score of 4    Past Surgical History:  Procedure Laterality Date   BACK SURGERY     BACK SURGERY  07/16/2018   BILATERAL SALPINGOOPHORECTOMY     BIOPSY  12/24/2011   Procedure: BIOPSY;  Surgeon: Margo LITTIE Haddock, MD;  Location: [facility];  Service: Endoscopy;;  Gastric Biopsies   BIOPSY N/A  06/30/2012   Procedure: BIOPSY;  Surgeon: Margo LITTIE Haddock, MD;  Location: [facility];  Service: Endoscopy;  Laterality: N/A;  Gastric and Esophageal Biopsies   BIOPSY  06/23/2023   Procedure: BIOPSY;  Surgeon: Cindie Carlin POUR, DO;  Location: [facility];  Service: Endoscopy;;   CARPAL TUNNEL RELEASE  LEFT   CATARACT EXTRACTION W/PHACO Right 07/30/2016   Procedure: CATARACT EXTRACTION PHACO AND INTRAOCULAR LENS PLACEMENT (IOC);  Surgeon: Oneil Platts, MD;  Location: [facility];  Service: Ophthalmology;  Laterality: Right;  CDE: 5.45   CATARACT EXTRACTION W/PHACO Left 08/13/2016   Procedure: CATARACT EXTRACTION PHACO AND INTRAOCULAR LENS PLACEMENT (IOC);  Surgeon: Oneil Platts, MD;  Location: [facility];  Service: Ophthalmology;  Laterality: Left;  CDE: 5.59   COLON SURGERY     COLONOSCOPY  JAN 2009 DIARRHEA, ABD PAIN, BRBPR   ANASTOMOTIC ULCER(3MM), IH Ak:AZWPHW ULCER, NL COLON bX   COLONOSCOPY WITH PROPOFOL  N/A 04/21/2018   examined portion of the ileum normal, two 3-5 mm polyps, diverticulosis in the rectosigmoid and sigmoid colon, external and internal hemorrhoids, significant looping of the colon.  Pathology with tubular adenomas.  Recommendations to follow high-fiber diet and repeat colonoscopy in  5 to 10 years with peds colonoscope.   COLONOSCOPY WITH PROPOFOL  N/A 06/23/2023   Procedure: COLONOSCOPY WITH PROPOFOL ;  Surgeon: Cindie Carlin POUR, DO;  [facility];  Service: Endoscopy;  Laterality: N/A;  10:00 am, asa 3   DILATION AND CURETTAGE OF UTERUS     ESOPHAGEAL DILATION  12/24/2011   SLF: A stricture was found in the distal esophagus/ Moderate gastritis/ MILD Duodenitis   ESOPHAGEAL DILATION N/A 06/23/2023   Procedure: ESOPHAGEAL DILATION;  Surgeon: Cindie Carlin POUR, DO;  [facility];  Service: Endoscopy;  Laterality: N/A;   ESOPHAGOGASTRODUODENOSCOPY  02/07/09   mild gastritis   ESOPHAGOGASTRODUODENOSCOPY N/A 09/11/2023   Procedure: EGD (ESOPHAGOGASTRODUODENOSCOPY);  Surgeon:  Cindie Carlin POUR, DO;  [facility];  Service: Endoscopy;  Laterality: N/A;  8:15 AM, ASA 3   ESOPHAGOGASTRODUODENOSCOPY (EGD) WITH PROPOFOL  N/A 06/30/2012   SLF: 1. No definite stricture appreciated 2. small hiatal hernia 3. Gastritis   ESOPHAGOGASTRODUODENOSCOPY (EGD) WITH PROPOFOL  N/A 02/20/2016   Procedure: ESOPHAGOGASTRODUODENOSCOPY (EGD) WITH PROPOFOL ;  Surgeon: Margo LITTIE Haddock, MD;  [facility];  Service: Endoscopy;  Laterality: N/A;  930   ESOPHAGOGASTRODUODENOSCOPY (EGD) WITH PROPOFOL  N/A 11/24/2018   benign-appearing esophageal peptic stricture s/p dilation, small hiatal hernia, moderate erosive gastritis.    ESOPHAGOGASTRODUODENOSCOPY (EGD) WITH PROPOFOL  N/A 06/23/2023   Procedure: ESOPHAGOGASTRODUODENOSCOPY (EGD) WITH PROPOFOL ;  Surgeon: Cindie Carlin POUR, DO;  [facility];  Service: Endoscopy;  Laterality: N/A;   EYE SURGERY     gum flap Bilateral    HEMICOLECTOMY  RIGHT 2006   HERNIA REPAIR     LUMBAR DISC SURGERY     POLYPECTOMY  04/21/2018   Procedure: POLYPECTOMY;  Surgeon: Haddock Margo LITTIE, MD;  [facility];  Service: Endoscopy;;  (colon)   SAVORY DILATION N/A 06/30/2012   Procedure: SAVORY DILATION;  Surgeon: Margo LITTIE Haddock, MD;  Location: [facility];  Service: Endoscopy;  Laterality: N/A;  12.48mm , 14mm, 15mm   SAVORY DILATION N/A 02/20/2016   Procedure: SAVORY DILATION;  Surgeon: Margo LITTIE Haddock, MD;  [facility];  Service: Endoscopy;  Laterality: N/A;   SAVORY DILATION N/A 11/24/2018   Procedure: SAVORY DILATION;  Surgeon: Haddock Margo LITTIE, MD;  [facility];  Service: Endoscopy;  Laterality: N/A;   UPPER GASTROINTESTINAL ENDOSCOPY  JAN 2009 ABD PAIN   GASTRITIS, ESO RING   UPPER GASTROINTESTINAL ENDOSCOPY  OCT 2010 ABD PAIN, DYSPHAGIA   DIL , GASTRITIS, NL DUODENUM   UPPER GASTROINTESTINAL ENDOSCOPY  OCT 2010 DYSPHAGIA   DIL 17 MM, GASTRITIS, NL DUODENUM   vocal cord surgery  Sep 20, 2010   precancerous  areas removed   wisdom tooth extraction Right    pin placement due to broken jaw    Family History  Problem Relation Age of Onset   Hypothyroidism Mother    Heart disease Father    Parkinson's disease Father    Hypothyroidism Daughter    Heart attack Paternal Grandfather    Alzheimer's disease Paternal Grandmother    Heart attack Maternal Grandfather    Crohn's disease Sister    Other Sister        was murdered   Crohn's disease Maternal Uncle    Colon cancer Neg Hx    Colon polyps Neg Hx     Social History   Socioeconomic History   Marital status: Married    Spouse name: Lemond   Number of children: 2   Years  of education: GED   Highest education level: Not on file  Occupational History    Comment: disabled  Tobacco Use   Smoking status: Every Day    Current packs/day: 0.25    Average packs/day: 0.3 packs/day for 30.0 years (7.5 ttl pk-yrs)    Types: Cigarettes   Smokeless tobacco: Never  Vaping Use   Vaping status: Never Used  Substance and Sexual Activity   Alcohol use: Not Currently    Comment: wine/beer a few times a week    Drug use: No   Sexual activity: Not Currently    Birth control/protection: Post-menopausal  Other Topics Concern   Not on file  Social History Narrative   Lives with husband   no caffiene   Social Drivers of Health   Financial Resource Strain: Low Risk  (01/14/2022)   Overall Financial Resource Strain (CARDIA)    Difficulty of Paying Living Expenses: Not hard at all  Food Insecurity: No Food Insecurity (01/14/2022)   Hunger Vital Sign    Worried About Running Out of Food in the Last Year: Never true    Ran Out of Food in the Last Year: Never true  Transportation Needs: No Transportation Needs (01/14/2022)   PRAPARE - Administrator, Civil Service (Medical): No    Lack of Transportation (Non-Medical): No  Physical Activity: Insufficiently Active (01/14/2022)   Exercise Vital Sign    Days of Exercise per Week: 2 days     Minutes of Exercise per Session: 20 min  Stress: No Stress Concern Present (01/14/2022)   [employer] of Occupational Health - Occupational Stress Questionnaire    Feeling of Stress : Not at all  Social Connections: Moderately Isolated (01/14/2022)   Social Connection and Isolation Panel    Frequency of Communication with Friends and Family: Three times a week    Frequency of Social Gatherings with Friends and Family: Once a week    Attends Religious Services: Never    Database administrator or Organizations: No    Attends Banker Meetings: Never    Marital Status: Married  Catering manager Violence: Not At Risk (01/14/2022)   Humiliation, Afraid, Rape, and Kick questionnaire    Fear of Current or Ex-Partner: No    Emotionally Abused: No    Physically Abused: No    Sexually Abused: No     Physical Exam   Vitals:   10/27/23 0500 10/27/23 0509  BP: 111/80   Pulse: 92 97  Resp: 14 11  Temp:    SpO2: (!) 87% 95%    CONSTITUTIONAL: Well-appearing, NAD NEURO/PSYCH:  Alert and oriented x 3, no focal deficits EYES:  eyes equal and reactive ENT/NECK:  no LAD, no JVD CARDIO: Regular rate, well-perfused, normal S1 and S2 PULM:  CTAB no wheezing or rhonchi GI/GU:  non-distended, non-tender MSK/SPINE:  No gross deformities, no edema SKIN:  no rash, atraumatic   *Additional and/or pertinent findings included in MDM below  Diagnostic and Interventional Summary    EKG Interpretation Date/Time:  Monday October 27 2023 05:02:27 EDT Ventricular Rate:  95 PR Interval:  184 QRS Duration:  113 QT Interval:  387 QTC Calculation: 487 R Axis:   25  Text Interpretation: Sinus rhythm Incomplete right bundle branch block Abnormal R-wave progression, late transition Probable inferior infarct, old Confirmed by Theadore Sharper [phone]) on 10/27/2023 6:22:17 AM       Labs Reviewed  BASIC METABOLIC PANEL WITH GFR - Abnormal; Notable for the following  components:      Result Value    Sodium 117 (*)    Potassium 2.7 (*)    Chloride 82 (*)    Glucose, Bld 117 (*)    Calcium  8.1 (*)    All other components within normal limits  CBC - Abnormal; Notable for the following components:   Hemoglobin 15.3 (*)    MCH 35.6 (*)    MCHC 37.4 (*)    Platelets 129 (*)    All other components within normal limits  MAGNESIUM     CT HEAD WO CONTRAST ( )  Final Result    CT CERVICAL SPINE WO CONTRAST  Final Result      Medications  lactated ringers  bolus 1,000 mL (has no administration in time range)  potassium chloride  10 mEq in 100 mL IVPB (has no administration in time range)  potassium chloride  (KLOR-CON ) packet 60 mEq (has no administration in time range)  prochlorperazine (COMPAZINE) injection 5 mg (5 mg Intravenous Given 10/27/23 0459)  diphenhydrAMINE (BENADRYL) injection 25 mg (25 mg Intravenous Given 10/27/23 0502)     Procedures  /  Critical Care .Critical Care  Performed by: Theadore Ozell HERO, MD Authorized by: Theadore Ozell HERO, MD   Critical care provider statement:    Critical care time (minutes):  45   Critical care was necessary to treat or prevent imminent or life-threatening deterioration of the following conditions:  Metabolic crisis (Profound hyponatremia, hypokalemia)   Critical care was time spent personally by me on the following activities:  Development of treatment plan with patient or surrogate, discussions with consultants, evaluation of patient's response to treatment, examination of patient, ordering and review of laboratory studies, ordering and review of radiographic studies, ordering and performing treatments and interventions, pulse oximetry, re-evaluation of patient's condition and review of old charts   ED Course and Medical Decision Making  Initial Impression and Ddx Syncope, differential diagnosis includes arrhythmia, electrolyte disturbance, anemia.  Head trauma with loss of consciousness, confusion afterwards, concussion versus  intracranial bleeding.  Cervical spinal fracture also considered.  Past medical/surgical history that increases complexity of ED encounter: None  Interpretation of Diagnostics I personally reviewed the Laboratory Testing and my interpretation is as follows: Hyponatremia, hypokalemia    Patient Reassessment and Ultimate Disposition/Management Metabolic disturbance, will need hospitalist admission.  Signed out to oncoming provider.  Patient management required discussion with the following services or consulting groups:  Hospitalist Service  Complexity of Problems Addressed Acute illness or injury that poses threat of life of bodily function  Additional Data Reviewed and Analyzed Further history obtained from: Further history from spouse/family member  Additional Factors Impacting ED Encounter Risk Consideration of hospitalization  Ozell HERO. Theadore, MD [facility] Emergency Medicine [facility] [email or web address]  Final Clinical Impressions(s) / ED Diagnoses     ICD-10-CM   1. Hypokalemia  E87.6     2. Hyponatremia  E87.1       ED Discharge Orders     None        Discharge Instructions Discussed with and Provided to Patient:   Discharge Instructions   None      Theadore Ozell HERO, MD 10/27/23 [phone]

## 2023-10-27 NOTE — Progress Notes (Signed)
 Brief PCCM Note:  Called by ED for evaluation of transfer to [facility]. Patient had fall at home brought in with head and neck pain. Head CT/C-spine negative for acute pathology. Noted to have seizure activity in ER, given ativan IV with resolution of seizures. Sodium level of 117. Hx of alcohol abuse. She is lethargic but purposeful per ED provider. She is not intubated.   Advised to load patient with phenobarbital for possible alcohol withdrawal seizures vs seizures from hyponatremia.   Given patient's current neurostatus, advised patient was ok to stay at [facility] for further care.  Recommend q4-6 hour sodium checks, with goal sodium 125-126 in first 24 hours. She was given 3% hypertonic saline bolus in ER. Can continue NS at rate of 75-119mL and monitor Na level progression.  Please call with any further questions.  Dorn Chill, MD [facility] Office: [phone]   See Amion for personal pager PCCM on call pager [phone] until 7pm. Please call Elink 7p-7a. [phone]

## 2023-10-27 NOTE — ED Triage Notes (Signed)
 Pt presents with fall after getting up from commode. Pt c/o neck pain and had knot to right side of the back of head. Pt is not on any blood thinners.

## 2023-10-27 NOTE — ED Notes (Signed)
 Patient transported to CT

## 2023-10-27 NOTE — Plan of Care (Signed)
  Problem: Clinical Measurements: Goal: Ability to maintain clinical measurements within normal limits will improve Outcome: Progressing Goal: Will remain free from infection Outcome: Progressing Goal: Diagnostic test results will improve Outcome: Progressing Goal: Respiratory complications will improve Outcome: Progressing Goal: Cardiovascular complication will be avoided Outcome: Progressing   Problem: Activity: Goal: Risk for activity intolerance will decrease Outcome: Progressing   Problem: Nutrition: Goal: Adequate nutrition will be maintained Outcome: Progressing   Problem: Elimination: Goal: Will not experience complications related to bowel motility Outcome: Progressing Goal: Will not experience complications related to urinary retention Outcome: Progressing

## 2023-10-28 DIAGNOSIS — F1010 Alcohol abuse, uncomplicated: Secondary | ICD-10-CM | POA: Diagnosis not present

## 2023-10-28 DIAGNOSIS — E876 Hypokalemia: Secondary | ICD-10-CM | POA: Diagnosis not present

## 2023-10-28 DIAGNOSIS — G9341 Metabolic encephalopathy: Secondary | ICD-10-CM | POA: Diagnosis not present

## 2023-10-28 DIAGNOSIS — E871 Hypo-osmolality and hyponatremia: Secondary | ICD-10-CM | POA: Diagnosis not present

## 2023-10-28 LAB — BASIC METABOLIC PANEL WITH GFR
Anion gap: 11 (ref 5–15)
BUN: 9 mg/dL (ref 8–23)
CO2: 27 mmol/L (ref 22–32)
Calcium: 9.1 mg/dL (ref 8.9–10.3)
Chloride: 97 mmol/L — ABNORMAL LOW (ref 98–111)
Creatinine, Ser: 0.61 mg/dL (ref 0.44–1.00)
GFR, Estimated: >60 mL/min (ref >60)
Glucose, Bld: 129 mg/dL — ABNORMAL HIGH (ref 70–99)
Potassium: 2.7 mmol/L — CL (ref 3.5–5.1)
Sodium: 135 mmol/L (ref 135–145)

## 2023-10-28 LAB — SODIUM
Sodium: 136 mmol/L (ref 135–145)
Sodium: 133 mmol/L — ABNORMAL LOW (ref 135–145)
Sodium: 131 mmol/L — ABNORMAL LOW (ref 135–145)
Sodium: 130 mmol/L — ABNORMAL LOW (ref 135–145)
Sodium: 129 mmol/L — ABNORMAL LOW (ref 135–145)
Sodium: 127 mmol/L — ABNORMAL LOW (ref 135–145)
Sodium: 127 mmol/L — ABNORMAL LOW (ref 135–145)
Sodium: 126 mmol/L — ABNORMAL LOW (ref 135–145)

## 2023-10-28 LAB — HIV ANTIBODY (ROUTINE TESTING W REFLEX): HIV Screen 4th Generation wRfx: NONREACTIVE

## 2023-10-28 LAB — POTASSIUM: Potassium: 4.3 mmol/L (ref 3.5–5.1)

## 2023-10-28 MED ORDER — POTASSIUM CHLORIDE 20 MEQ PO PACK: 60.0000 meq | PACK | Freq: Once | ORAL | Status: DC

## 2023-10-28 MED ORDER — POTASSIUM CHLORIDE 10 MEQ/100ML IV SOLN
10.0000 meq | INTRAVENOUS | Status: DC
  Administered 2023-10-28: 10 meq via INTRAVENOUS
  Filled 2023-10-28 (×3): qty 100

## 2023-10-28 MED ORDER — POTASSIUM CHLORIDE 10 MEQ/100ML IV SOLN
10.0000 meq | INTRAVENOUS | Status: AC
  Administered 2023-10-28 (×4): 10 meq via INTRAVENOUS
  Filled 2023-10-28 (×3): qty 100

## 2023-10-28 MED ORDER — DESMOPRESSIN ACETATE 4 MCG/ML IJ SOLN
2.0000 ug | Freq: Once | INTRAMUSCULAR | Status: AC
  Administered 2023-10-28: 2 ug via INTRAVENOUS
  Filled 2023-10-28: qty 0.5

## 2023-10-28 MED ORDER — POTASSIUM CHLORIDE 10 MEQ/100ML IV SOLN
10.0000 meq | INTRAVENOUS | Status: DC
  Administered 2023-10-28: 10 meq via INTRAVENOUS

## 2023-10-28 MED ORDER — DEXTROSE 5 % IV BOLUS
250.0000 mL | Freq: Once | INTRAVENOUS | Status: AC
  Administered 2023-10-28: 250 mL via INTRAVENOUS

## 2023-10-28 MED ORDER — POTASSIUM CHLORIDE 20 MEQ PO PACK: 40.0000 meq | PACK | Freq: Once | ORAL | Status: DC

## 2023-10-28 NOTE — Progress Notes (Signed)
 Sereniti B Euceda is an 68 y.o. female with a history of heavy EtOH abuse including 12-15 beers a day, 2 to 3 glasses of wine presenting with a witnessed seizure, encephalopathy found to be hyponatremic with a sodium level 117, potassium 2.7 and magnesium  1.5.  Initially treated with 1 L of normal saline as well as 3% which was given.   Assessment/Plan: Hyponatremia -treating as chronic as we do not have prior serum sodiums before this hospitalization.  Hyponatremia secondary to low solute intake/potomania with a likely reset osmostat as well. Goal was 125 x 6 a.m. 6/24 (10mmol/L in 1st 24hrs)  but overcorrected to 140 and then given hypotonic fluid as well as DDAVP.  Serum sodium was 130 at 4 PM on 6/23 peaking at 141 at 6 almost 8 PM 6/23 and then slowly downtrending to 133 at 6:30 AM 6/24. DDAVP given at 936PM on 6/23.  -Continue every 2 hours sodium checks -> if upward trend in SNa will need another bolus of D5W. -Goal ~130 6/25 7AM - Will also give another dose of DDAVP.  Hypokalemia -able to take p.o. 1 and will aggressively replace with IV.  Will also recheck later in the day to determine if we need to give additional replacement so we don't get behind overnight. Seizures secondary to hyponatremia and treated with Ativan initially; should stabilize with improvement in SNa. H/o COPD  Subjective: Overly rapid correction -> DDAVP and D5W. Received 2L bolus of D5W overnight and now at 110mL/hr.   Chemistry and CBC: Creat  Date/Time Value Ref Range Status  07/02/2019 11:34 AM 0.53 0.50 - 0.99 mg/dL Final    Comment:    For patients >63 years of age, the reference limit for Creatinine is approximately 13% higher for people identified as African-American. .   02/26/2019 10:06 AM 0.43 (L) 0.50 - 0.99 mg/dL Final    Comment:    For patients >67 years of age, the reference limit for Creatinine is approximately 13% higher for people identified as African-American. SABRA   11/10/2018 11:38 AM 0.55  0.50 - 0.99 mg/dL Final    Comment:    For patients >44 years of age, the reference limit for Creatinine is approximately 13% higher for people identified as African-American. SABRA   12/23/2017 11:50 AM 0.47 (L) 0.50 - 0.99 mg/dL Final    Comment:    For patients >5 years of age, the reference limit for Creatinine is approximately 13% higher for people identified as African-American. SABRA   06/25/2017 11:13 AM 0.40 (L) 0.50 - 1.05 mg/dL Final    Comment:    For patients >91 years of age, the reference limit for Creatinine is approximately 13% higher for people identified as African-American. SABRA   06/24/2016 10:13 AM 0.49 (L) 0.50 - 1.05 mg/dL Final    Comment:      For patients > or = 66 years of age: The upper reference limit for Creatinine is approximately 13% higher for people identified as African-American.     07/04/2015 08:49 AM 0.58 0.50 - 1.05 mg/dL Final  91/76/7983 87:42 PM 0.42 (L) 0.50 - 1.05 mg/dL Final  95/95/7983 88:71 AM 0.43 (L) 0.50 - 1.10 mg/dL Final  93/74/7984 89:64 AM 0.60 0.50 - 1.10 mg/dL Final  93/77/7984 89:86 AM 0.50 0.50 - 1.10 mg/dL Final  87/91/7985 87:89 PM 0.51 0.50 - 1.10 mg/dL Final  91/91/7985 89:54 AM 0.54 0.50 - 1.10 mg/dL Final  96/68/7985 96:91 PM 0.65 0.50 - 1.10 mg/dL Final  88/94/7987 98:84  PM 0.52 0.50 - 1.10 mg/dL Final   Creatinine, Ser  Date/Time Value Ref Range Status  10/28/2023 04:40 AM 0.61 0.44 - 1.00 mg/dL Final  93/76/7974 91:42 AM 0.80 0.44 - 1.00 mg/dL Final  93/76/7974 93:81 AM 0.69 0.44 - 1.00 mg/dL Final  89/89/7975 89:49 AM 0.47 (L) 0.57 - 1.00 mg/dL Final  96/81/7975 88:73 AM 0.48 (L) 0.57 - 1.00 mg/dL Final  91/75/7976 88:57 AM 0.46 (L) 0.57 - 1.00 mg/dL Final  89/92/7977 88:95 AM 0.65 0.57 - 1.00 mg/dL Final  91/94/7977 97:97 PM 0.60 0.44 - 1.00 mg/dL Final  90/72/7978 91:55 AM 0.42 (L) 0.44 - 1.00 mg/dL Final  96/86/7979 93:90 AM 0.84 0.44 - 1.00 mg/dL Final  96/94/7979 88:74 AM 0.55 0.44 - 1.00 mg/dL Final   98/83/7981 87:65 PM 0.46 0.44 - 1.00 mg/dL Final  89/88/7982 87:47 PM 0.36 (L) 0.44 - 1.00 mg/dL Final  89/93/7982 98:70 PM 0.53 0.44 - 1.00 mg/dL Final  94/76/7983 89:44 AM 0.44 0.44 - 1.00 mg/dL Final  88/98/7984 87:85 PM 0.38 (L) 0.50 - 1.10 mg/dL Final  96/76/7984 98:88 PM 0.50 0.50 - 1.10 mg/dL Final  97/78/7985 98:47 PM 0.58 0.50 - 1.10 mg/dL Final  91/74/7986 93:81 AM 0.52 0.50 - 1.10 mg/dL Final  91/75/7986 93:84 AM 0.52 0.50 - 1.10 mg/dL Final  91/76/7986 96:72 AM 0.42 (L) 0.50 - 1.10 mg/dL Final  91/83/7986 87:99 PM 0.46 (L) 0.50 - 1.10 mg/dL Final  92/85/7988 91:50 PM 0.67 0.40 - 1.20 mg/dL Final  89/98/7989 87:80 PM 0.54 0.4 - 1.2 mg/dL Final  91/80/7989 89:85 PM 0.58 0.40 - 1.20 mg/dL Final  93/85/7989 97:84 PM 0.50 0.4 - 1.2 mg/dL Final  95/80/7989 96:88 PM 0.50 0.4 - 1.2 mg/dL Final  96/77/7989 95:88 PM 0.56 0.40 - 1.20 mg/dL Final  98/86/7989 91:83 PM 0.71 0.40 - 1.20 mg/dL Final  87/98/7990 97:48 AM 0.73 0.40 - 1.20 mg/dL Final  88/86/7990 89:98 AM 0.62 0.40 - 1.20 mg/dL Final  88/88/7990 90:67 AM 0.61 0.40 - 1.20 mg/dL Final  93/89/7990 94:93 AM 0.64 0.40 - 1.20 mg/dL Final  89/96/7991 92:90 PM 0.71 0.40 - 1.20 mg/dL Final   Recent Labs  Lab 10/27/23 0618 10/27/23 0857 10/27/23 0921 10/27/23 1557 10/27/23 1730 10/27/23 1951 10/27/23 2118 10/28/23 0155 10/28/23 0440 10/28/23 0627  NA 117* 122*   < > 130* 138 141 138 136 135 133*  K 2.7* 3.4*  --   --   --   --   --   --  2.7*  --   CL 82* 85*  --   --   --   --   --   --  97*  --   CO2 22 27  --   --   --   --   --   --  27  --   GLUCOSE 117* 78  --   --   --   --   --   --  129*  --   BUN 14 15  --   --   --   --   --   --  9  --   CREATININE 0.69 0.80  --   --   --   --   --   --  0.61  --   CALCIUM  8.1* 8.3*  --   --   --   --   --   --  9.1  --   PHOS  --   --   --  3.3  --   --   --   --   --   --    < > = values in this interval not displayed.   Recent Labs  Lab 10/27/23 0618  WBC 6.8  HGB  15.3*  HCT 40.9  MCV 95.1  PLT 129*   Liver Function Tests: No results for input(s): AST, ALT, ALKPHOS, BILITOT, PROT, ALBUMIN in the last 168 hours. No results for input(s): LIPASE, AMYLASE in the last 168 hours. No results for input(s): AMMONIA in the last 168 hours. Cardiac Enzymes: No results for input(s): CKTOTAL, CKMB, CKMBINDEX, TROPONINI in the last 168 hours. Iron Studies: No results for input(s): IRON, TIBC, TRANSFERRIN, FERRITIN in the last 72 hours. PT/INR: @LABRCNTIP (inr:5)  Xrays/Other Studies: ) Results for orders placed or performed during the hospital encounter of 10/27/23 (from the past 48 hours)  Basic metabolic panel     Status: Abnormal   Collection Time: 10/27/23  6:18 AM  Result Value Ref Range   Sodium 117 (LL) 135 - 145 mmol/L    Comment: CRITICAL RESULT CALLED TO, READ BACK BY AND VERIFIED WITH A.FLETCHER RN 10/27/23 @0747  BY J. WHITE   Potassium 2.7 (LL) 3.5 - 5.1 mmol/L    Comment: CRITICAL RESULT CALLED TO, READ BACK BY AND VERIFIED WITH A.FLETCHER RN 10/27/23 @0747  BY J. WHITE   Chloride 82 (L) 98 - 111 mmol/L   CO2 22 22 - 32 mmol/L   Glucose, Bld 117 (H) 70 - 99 mg/dL    Comment: Glucose reference range applies only to samples taken after fasting for at least 8 hours.   BUN 14 8 - 23 mg/dL   Creatinine, Ser 9.30 0.44 - 1.00 mg/dL   Calcium  8.1 (L) 8.9 - 10.3 mg/dL   GFR, Estimated >39 >39 mL/min    Comment: (NOTE) Calculated using the CKD-EPI Creatinine Equation (2021)    Anion gap 13 5 - 15    Comment: Performed at [facility], [address] [place], [place] [address]  CBC     Status: Abnormal   Collection Time: 10/27/23  6:18 AM  Result Value Ref Range   WBC 6.8 4.0 - 10.5 K/uL   RBC 4.30 3.87 - 5.11 MIL/uL   Hemoglobin 15.3 (H) 12.0 - 15.0 g/dL   HCT 59.0 63.9 - 53.9 %   MCV 95.1 80.0 - 100.0 fL   MCH 35.6 (H) 26.0 - 34.0 pg   MCHC 37.4 (H) 30.0 - 36.0 g/dL   RDW 87.3 88.4 - 84.4 %   Platelets 129  (L) 150 - 400 K/uL   nRBC 0.0 0.0 - 0.2 %    Comment: Performed at [facility], [address], [place], [place] [address]  Magnesium      Status: Abnormal   Collection Time: 10/27/23  6:18 AM  Result Value Ref Range   Magnesium  1.5 (L) 1.7 - 2.4 mg/dL    Comment: Performed at [facility], [address] [place], [place] [address]  Osmolality     Status: Abnormal   Collection Time: 10/27/23  8:57 AM  Result Value Ref Range   Osmolality 255 (L) 275 - 295 mOsm/kg    Comment: Performed at [facility], [address] [place], [place] [address]  Basic metabolic panel     Status: Abnormal   Collection Time: 10/27/23  8:57 AM  Result Value Ref Range   Sodium 122 (L) 135 - 145 mmol/L   Potassium 3.4 (L) 3.5 - 5.1 mmol/L  Chloride 85 (L) 98 - 111 mmol/L   CO2 27 22 - 32 mmol/L   Glucose, Bld 78 70 - 99 mg/dL    Comment: Glucose reference range applies only to samples taken after fasting for at least 8 hours.   BUN 15 8 - 23 mg/dL   Creatinine, Ser 9.19 0.44 - 1.00 mg/dL   Calcium  8.3 (L) 8.9 - 10.3 mg/dL   GFR, Estimated >39 >39 mL/min    Comment: (NOTE) Calculated using the CKD-EPI Creatinine Equation (2021)    Anion gap 10 5 - 15    Comment: Performed at [facility], [address], [place], [place] [address]  Sodium     Status: Abnormal   Collection Time: 10/27/23  9:21 AM  Result Value Ref Range   Sodium 122 (L) 135 - 145 mmol/L    Comment: Performed at [facility], [address], [place], [place] [address]  Sodium     Status: Abnormal   Collection Time: 10/27/23 11:09 AM  Result Value Ref Range   Sodium 126 (L) 135 - 145 mmol/L    Comment: DELTA CHECK NOTED Performed at [facility], [address] [place], [place] [address]   MRSA Next Gen by PCR, Nasal     Status: None   Collection Time: 10/27/23 12:55 PM   Specimen: Nasal Mucosa; Nasal Swab  Result Value Ref Range   MRSA by PCR Next Gen NOT DETECTED NOT DETECTED    Comment: (NOTE) The GeneXpert MRSA Assay  (FDA approved for NASAL specimens only), is one component of a comprehensive MRSA colonization surveillance program. It is not intended to diagnose MRSA infection nor to guide or monitor treatment for MRSA infections. Test performance is not FDA approved in patients less than 51 years old. Performed at [facility], [address] [place], [place] [address]   Osmolality, urine     Status: Abnormal   Collection Time: 10/27/23  3:40 PM  Result Value Ref Range   Osmolality, Ur 74 (L) 300 - 900 mOsm/kg    Comment: PERFORMED AT [facility] Performed at [facility], [address], [place], [place] [address]   Na and K (sodium & potassium), rand urine     Status: None   Collection Time: 10/27/23  3:40 PM  Result Value Ref Range   Sodium, Ur 13 mmol/L   Potassium Urine 7 mmol/L    Comment: Performed at [facility], [address], [place], [place] [address]  Urinalysis, Routine w reflex microscopic -Urine, Clean Catch     Status: Abnormal   Collection Time: 10/27/23  3:40 PM  Result Value Ref Range   Color, Urine STRAW (A) YELLOW   APPearance CLEAR CLEAR   Specific Gravity, Urine 1.001 (L) 1.005 - 1.030   pH 6.0 5.0 - 8.0   Glucose, UA NEGATIVE NEGATIVE mg/dL   Hgb urine dipstick SMALL (A) NEGATIVE   Bilirubin Urine NEGATIVE NEGATIVE   Ketones, ur NEGATIVE NEGATIVE mg/dL   Protein, ur NEGATIVE NEGATIVE mg/dL   Nitrite NEGATIVE NEGATIVE   Leukocytes,Ua NEGATIVE NEGATIVE   RBC / HPF 0-5 0 - 5 RBC/hpf   WBC, UA 0-5 0 - 5 WBC/hpf   Bacteria, UA RARE (A) NONE SEEN   Squamous Epithelial / HPF 0-5 0 - 5 /HPF    Comment: Performed at [facility], [address] [place], [place] [address]  Sodium     Status: Abnormal   Collection Time: 10/27/23  3:57 PM  Result Value  Ref Range   Sodium 130 (L) 135 - 145 mmol/L    Comment: Performed at [facility], [address] [place], [place] [address]  Magnesium      Status: None   Collection Time: 10/27/23  3:57 PM  Result Value Ref  Range   Magnesium  2.4 1.7 - 2.4 mg/dL    Comment: Performed at [facility], [address], [place], [place] [address]  Phosphorus     Status: None   Collection Time: 10/27/23  3:57 PM  Result Value Ref Range   Phosphorus 3.3 2.5 - 4.6 mg/dL    Comment: Performed at [facility], [address], [place], [place] [address]  Sodium     Status: None   Collection Time: 10/27/23  5:30 PM  Result Value Ref Range   Sodium 138 135 - 145 mmol/L    Comment: DELTA CHECK NOTED Performed at [facility], [address], [place], [place] [address]   Sodium     Status: None   Collection Time: 10/27/23  7:51 PM  Result Value Ref Range   Sodium 141 135 - 145 mmol/L    Comment: DELTA CHECK NOTED Performed at [facility], [address], [place], [place] [address]   Sodium     Status: None   Collection Time: 10/27/23  9:18 PM  Result Value Ref Range   Sodium 138 135 - 145 mmol/L    Comment: DELTA CHECK NOTED Performed at [facility], [address] [place], [place] [address]   Sodium     Status: None   Collection Time: 10/28/23  1:55 AM  Result Value Ref Range   Sodium 136 135 - 145 mmol/L    Comment: Performed at [facility], [address] [place], [place] [address]  Basic metabolic panel with GFR     Status: Abnormal   Collection Time: 10/28/23  4:40 AM  Result Value Ref Range   Sodium 135 135 - 145 mmol/L   Potassium 2.7 (LL) 3.5 - 5.1 mmol/L    Comment: CRITICAL RESULT CALLED TO, READ BACK BY AND VERIFIED WITH C. RENNA [phone], VIRAY,J   Chloride 97 (L) 98 - 111 mmol/L   CO2 27 22 - 32 mmol/L   Glucose, Bld 129 (H) 70 - 99 mg/dL    Comment: Glucose reference range applies only to samples taken after fasting for at least 8 hours.   BUN 9 8 - 23 mg/dL   Creatinine, Ser 9.38 0.44 - 1.00 mg/dL   Calcium  9.1 8.9 - 10.3 mg/dL   GFR, Estimated >39 >39 mL/min    Comment: (NOTE) Calculated using the CKD-EPI Creatinine Equation (2021)    Anion gap 11 5 - 15    Comment: Performed at  [facility], [address] [place], [place] [address]  Sodium     Status: Abnormal   Collection Time: 10/28/23  6:27 AM  Result Value Ref Range   Sodium 133 (L) 135 - 145 mmol/L    Comment: Performed at [facility], [address], [place], [place] [address]   *Note: Due to a large number of results and/or encounters for the requested time period, some results have not been displayed. A complete set of results can be found in Results Review.   CT CERVICAL SPINE WO CONTRAST Result Date: 10/27/2023 EXAM: CT CERVICAL SPINE WITHOUT CONTRAST 10/27/2023 06:00:13 AM TECHNIQUE: CT of the cervical was performed without the administration of intravenous contrast. Multiplanar reformatted images are provided for review. Automated exposure control,  iterative reconstruction, and/or weight based adjustment of the mA/kV was utilized to reduce the radiation dose to as low as reasonably achievable. COMPARISON: None available. CLINICAL HISTORY: Neck trauma (Age >= 65y). Pt presents with fall after getting up from commode. Pt c/o neck pain and had knot to right side of the back of head. Pt is not on any blood thinners. FINDINGS: CERVICAL SPINE: BONES AND ALIGNMENT: No acute fracture or traumatic malalignment. DEGENERATIVE CHANGES: Asymmetric left-sided facet spurring leads to moderate left foraminal stenosis at C3-4. Right greater than left facet hypertrophy is present at C4-5 with moderate foraminal stenosis bilaterally. Chronic vertebral spurring leads to severe foraminal stenosis on the left at C5-6 and bilaterally at C6-7. SOFT TISSUES: No prevertebral soft tissue swelling. IMPRESSION: 1. No acute fracture or traumatic malalignment. 2. Degenerative changes as detailed above. Electronically signed by: Lonni Necessary MD 10/27/2023 06:12 AM EDT RP Workstation: [id number]   CT HEAD WO CONTRAST ( ) Result Date: 10/27/2023 EXAM: CT HEAD WITHOUT CONTRAST 10/27/2023 06:00:13 AM TECHNIQUE: CT of the head was performed  without the administration of intravenous contrast. Automated exposure control, iterative reconstruction, and/or weight based adjustment of the mA/kV was utilized to reduce the radiation dose to as low as reasonably achievable. COMPARISON: None available. CLINICAL HISTORY: Head trauma, moderate-severe. Pt presents with fall after getting up from commode. Pt c/o neck pain and had knot to right side of the back of head. Pt is not on any blood thinners. FINDINGS: BRAIN AND VENTRICLES: No acute hemorrhage. Gray-white differentiation is preserved. No hydrocephalus. No extra-axial collection. No mass effect or midline shift. Mild generalized atrophy is present. Periventricular and scattered subcortical white matter hypoattenuation is moderately advanced for age. ORBITS: No acute abnormality. SINUSES: No acute abnormality. SOFT TISSUES AND SKULL: Left parietal scalp soft tissue swelling and hematoma are present without underlying fracture or foreign body. No skull fracture. VASCULATURE: Atherosclerotic calcifications are present in the cavernous carotid arteries bilaterally and at the dural margin of both vertebral arteries. No hyperdense vessel is present. IMPRESSION: 1. No acute intracranial abnormality. 2. Left parietal scalp soft tissue swelling and hematoma without underlying fracture or foreign body. 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   Crohn disease (HCC)    CTS (carpal tunnel syndrome)    Diarrhea MULITIFACTORIAL   IBS, LACTOSE INTOLERANCE, SBBO, BILE-SALT   Elevated liver enzymes 2009: BMI 24 ?Etoh ALT 94, AST 51,  NEG ANA, qIGs& ASMA   MAY 2012 AST 52 ALT 38   Esophageal stricture 2009   GERD  (gastroesophageal reflux disease)    Hemorrhoid    Hyperlipemia    Hypertension    Inflammatory bowel disease 2006 CD   SURGICAL REMISSION   LBP (low back pain)    Mental disorder    Migraine    Osteoporosis    PONV (postoperative nausea and vomiting)    Shortness of breath    exertion and humidity   Shoulder pain    right   Sleep apnea    Stop Bang Cock score of 4    PSH:   Past Surgical History:  Procedure Laterality Date   BACK  SURGERY     BACK SURGERY  07/16/2018   BILATERAL SALPINGOOPHORECTOMY     BIOPSY  12/24/2011   Procedure: BIOPSY;  Surgeon: Margo LITTIE Haddock, MD;  Location: [facility];  Service: Endoscopy;;  Gastric Biopsies   BIOPSY N/A 06/30/2012   Procedure: BIOPSY;  Surgeon: Margo LITTIE Haddock, MD;  Location: [facility];  Service: Endoscopy;  Laterality: N/A;  Gastric and Esophageal Biopsies   BIOPSY  06/23/2023   Procedure: BIOPSY;  Surgeon: Cindie Carlin POUR, DO;  [facility]: [facility];  Service: Endoscopy;;   CARPAL TUNNEL RELEASE  LEFT   CATARACT EXTRACTION W/PHACO Right 07/30/2016   Procedure: CATARACT EXTRACTION PHACO AND INTRAOCULAR LENS PLACEMENT (IOC);  Surgeon: Oneil Platts, MD;  Location: [facility];  Service: Ophthalmology;  Laterality: Right;  CDE: 5.45   CATARACT EXTRACTION W/PHACO Left 08/13/2016   Procedure: CATARACT EXTRACTION PHACO AND INTRAOCULAR LENS PLACEMENT (IOC);  Surgeon: Oneil Platts, MD;  Location: [facility];  Service: Ophthalmology;  Laterality: Left;  CDE: 5.59   COLON SURGERY     COLONOSCOPY  JAN 2009 DIARRHEA, ABD PAIN, BRBPR   ANASTOMOTIC ULCER(3MM), IH Ak:AZWPHW ULCER, NL COLON bX   COLONOSCOPY WITH PROPOFOL  N/A 04/21/2018   examined portion of the ileum normal, two 3-5 mm polyps, diverticulosis in the rectosigmoid and sigmoid colon, external and internal hemorrhoids, significant looping of the colon.  Pathology with tubular adenomas.  Recommendations to follow high-fiber diet and repeat colonoscopy in 5 to 10 years with peds colonoscope.   COLONOSCOPY  WITH PROPOFOL  N/A 06/23/2023   Procedure: COLONOSCOPY WITH PROPOFOL ;  Surgeon: Cindie Carlin POUR, DO;  [facility];  Service: Endoscopy;  Laterality: N/A;  10:00 am, asa 3   DILATION AND CURETTAGE OF UTERUS     ESOPHAGEAL DILATION  12/24/2011   SLF: A stricture was found in the distal esophagus/ Moderate gastritis/ MILD Duodenitis   ESOPHAGEAL DILATION N/A 06/23/2023   Procedure: ESOPHAGEAL DILATION;  Surgeon: Cindie Carlin POUR, DO;  [facility];  Service: Endoscopy;  Laterality: N/A;   ESOPHAGOGASTRODUODENOSCOPY  02/07/09   mild gastritis   ESOPHAGOGASTRODUODENOSCOPY N/A 09/11/2023   Procedure: EGD (ESOPHAGOGASTRODUODENOSCOPY);  Surgeon: Cindie Carlin POUR, DO;  [facility];  Service: Endoscopy;  Laterality: N/A;  8:15 AM, ASA 3   ESOPHAGOGASTRODUODENOSCOPY (EGD) WITH PROPOFOL  N/A 06/30/2012   SLF: 1. No definite stricture appreciated 2. small hiatal hernia 3. Gastritis   ESOPHAGOGASTRODUODENOSCOPY (EGD) WITH PROPOFOL  N/A 02/20/2016   Procedure: ESOPHAGOGASTRODUODENOSCOPY (EGD) WITH PROPOFOL ;  Surgeon: Margo LITTIE Haddock, MD;  [facility];  Service: Endoscopy;  Laterality: N/A;  930   ESOPHAGOGASTRODUODENOSCOPY (EGD) WITH PROPOFOL  N/A 11/24/2018   benign-appearing esophageal peptic stricture s/p dilation, small hiatal hernia, moderate erosive gastritis.    ESOPHAGOGASTRODUODENOSCOPY (EGD) WITH PROPOFOL  N/A 06/23/2023   Procedure: ESOPHAGOGASTRODUODENOSCOPY (EGD) WITH PROPOFOL ;  Surgeon: Cindie Carlin POUR, DO;  [facility];  Service: Endoscopy;  Laterality: N/A;   EYE SURGERY     gum flap Bilateral    HEMICOLECTOMY  RIGHT 2006   HERNIA REPAIR     LUMBAR DISC SURGERY     POLYPECTOMY  04/21/2018   Procedure: POLYPECTOMY;  Surgeon: Haddock Margo LITTIE, MD;  [facility];  Service: Endoscopy;;  (colon)   SAVORY DILATION N/A 06/30/2012   Procedure: SAVORY DILATION;  Surgeon: Margo LITTIE Haddock, MD;  Location: [facility];  Service: Endoscopy;   Laterality: N/A;  12.57mm , 14mm, 15mm   SAVORY DILATION N/A 02/20/2016   Procedure: SAVORY  DILATION;  Surgeon: Margo LITTIE Haddock, MD;  [facility];  Service: Endoscopy;  Laterality: N/A;   SAVORY DILATION N/A 11/24/2018   Procedure: SAVORY DILATION;  Surgeon: Haddock Margo LITTIE, MD;  [facility];  Service: Endoscopy;  Laterality: N/A;   UPPER GASTROINTESTINAL ENDOSCOPY  JAN 2009 ABD PAIN   GASTRITIS, ESO RING   UPPER GASTROINTESTINAL ENDOSCOPY  OCT 2010 ABD PAIN, DYSPHAGIA   DIL , GASTRITIS, NL DUODENUM   UPPER GASTROINTESTINAL ENDOSCOPY  OCT 2010 DYSPHAGIA   DIL 17 MM, GASTRITIS, NL DUODENUM   vocal cord surgery  Sep 20, 2010   precancerous areas removed   wisdom tooth extraction Right    pin placement due to broken jaw    Allergies:  Allergies  Allergen Reactions   Naproxen Shortness Of Breath    Chest pain   Dicyclomine  Hcl Other (See Comments)    Vision changes   Duloxetine Other (See Comments)    Suicidal    Egg-Derived Products Diarrhea    abd pain   Lovastatin Other (See Comments)    Chest pain   Toradol  [Ketorolac  Tromethamine ] Other (See Comments)    Headache. Non-effective   Tramadol Other (See Comments)    Headache. Non-effective.   Nexium  [Esomeprazole  Magnesium ] Diarrhea    Abdominal pain    Medications:   Prior to Admission medications   Medication Sig Start Date End Date Taking? Authorizing Provider  amLODipine  (NORVASC ) 10 MG tablet Take 1 tablet (10 mg total) by mouth daily. 08/15/23  Yes Melvenia Manus BRAVO, MD  benzonatate  (TESSALON ) 100 MG capsule Take 1 capsule by mouth twice daily 09/25/23  Yes Dixon, Phillip E, MD  Buprenorphine HCl-Naloxone HCl 4-1 MG FILM Place 1 Film under the tongue 4 (four) times daily. 07/23/23  Yes [provider]  calcium  carbonate (TUMS - DOSED IN MG ELEMENTAL CALCIUM ) 500 MG chewable tablet Chew 1 tablet (200 mg of elemental calcium  total) by mouth 3 (three) times daily with meals. 03/02/19  Yes  Nida, Ethelle ORN, MD  clotrimazole  (CLOTRIMAZOLE  AF) 1 % cream Apply 1 application topically 2 (two) times daily. To affected area for 1-2 weeks 01/04/20  Yes [place], Theodoro FALCON, MD  cyclobenzaprine  (FLEXERIL ) 5 MG tablet Take 1 tablet (5 mg total) by mouth 3 (three) times daily as needed for muscle spasms. 10/02/23  Yes Melvenia Manus BRAVO, MD  dexlansoprazole  (DEXILANT ) 60 MG capsule Take 1 capsule (60 mg total) by mouth daily. 06/04/23 06/03/24 Yes Carver, Charles K, DO  gabapentin  (NEURONTIN ) 300 MG capsule Take 300 mg by mouth every 8 (eight) hours. 09/16/23  Yes [provider]  lidocaine  (LIDODERM ) 5 % Place 1 patch onto the skin daily. 05/19/23  Yes [provider]  magnesium  hydroxide (MILK OF MAGNESIA) 400 MG/5ML suspension Take 30 mLs by mouth daily as needed for mild constipation.   Yes [provider]  nystatin  (MYCOSTATIN ) 100000 UNIT/ML suspension TAKE 5 MLS BY MOUTH 4 TIMES DAILY AS NEEDED. 09/25/23  Yes Dixon, Phillip E, MD  SYMBICORT  80-4.5 MCG/ACT inhaler Inhale 2 puffs by mouth twice daily Patient taking differently: Inhale 2 puffs into the lungs daily. 05/06/23  Yes Melvenia Manus BRAVO, MD  triamcinolone  (KENALOG ) 0.1 % Apply 1 application topically 2 (two) times daily. 07/28/20  Yes Tobie Suzzane POUR, MD  VENTOLIN  HFA 108 (90 Base) MCG/ACT inhaler INHALE 2 PUFFS BY MOUTH EVERY 6 HOURS AS NEEDED FOR WHEEZING FOR SHORTNESS OF BREATH 07/08/23  Yes Melvenia Manus BRAVO, MD  Discontinued Meds:   Medications Discontinued During This Encounter  Medication Reason   gabapentin  (NEURONTIN ) 400 MG capsule Change in therapy   fluticasone  (FLONASE ) 50 MCG/ACT nasal spray Patient Preference   lactase (LACTAID) 3000 units tablet Patient Preference   Lidocaine  HCl 4 % LIQD Patient Preference   Menthol  10 % AERO Patient Preference   methylPREDNISolone  (MEDROL  DOSEPAK) 4 MG TBPK tablet Patient Preference   BUPRENORPHINE HCL SL Patient Preference   sodium chloride  0.9 % bolus  1,000 mL    potassium chloride  10 mEq in 100 mL IVPB    potassium chloride  (KLOR-CON ) packet 60 mEq    potassium chloride  (KLOR-CON ) packet 40 mEq     Social History:  reports that she has been smoking cigarettes. She has a 7.5 pack-year smoking history. She has never used smokeless tobacco. She reports that she does not currently use alcohol. She reports that she does not use drugs.  Family History:   Family History  Problem Relation Age of Onset   Hypothyroidism Mother    Heart disease Father    Parkinson's disease Father    Hypothyroidism Daughter    Heart attack Paternal Grandfather    Alzheimer's disease Paternal Grandmother    Heart attack Maternal Grandfather    Crohn's disease Sister    Other Sister        was murdered   Crohn's disease Maternal Uncle    Colon cancer Neg Hx    Colon polyps Neg Hx     Blood pressure (!) 140/73, pulse (!) 51, temperature 98 F (36.7 C), temperature source Axillary, resp. rate 16, height 5' (1.524 m), weight 46.1 kg, SpO2 100%. Physical Exam: GEN: Female, lying in bed, unable to coherently answer questions, mumbles when asked, mainly c/o having the mitts on, moves all extremities. ENT: NCAT, scalp hematoma noted CV: Regular, normal S1 and S2 PULM: Clear bilaterally but poor effort ABD: Soft, nontender SKIN: No rashes or lesions EXT: No edema NEURO: Nonfocal, awakens to physical stimuli but disoriented      MELIA LYNWOOD ORN, MD 10/28/2023, 7:30 AM

## 2023-10-28 NOTE — TOC CM/SW Note (Signed)
 Transition of Care [facility]) - Inpatient Brief Assessment   Patient Details  Name: [name] [name] MRN: [id number] Date of Birth: 08-20-1955  Transition of Care [facility]) CM/SW Contact:    Lucie Lunger, LCSWA Phone Number: 10/28/2023, 8:58 AM   Clinical Narrative: Transition of Care Department [facility]) has reviewed patient and no TOC needs have been identified at this time. We will continue to monitor patient advancement through interdiciplinary progression rounds. If new patient transition needs arise, please place a TOC consult.  Transition of Care Asessment: Insurance and Status: Insurance coverage has been reviewed Patient has primary care physician: Yes Home environment has been reviewed: From home Prior level of function:: Independent Prior/Current Home Services: No current home services Social Drivers of Health Review: SDOH reviewed no interventions necessary Readmission risk has been reviewed: Yes Transition of care needs: no transition of care needs at this time

## 2023-10-28 NOTE — Plan of Care (Signed)

## 2023-10-28 NOTE — Progress Notes (Signed)
 Pt will not sit still and gets restless when urinating so the purewick has not been catching it well. Sometimes none at all. It is impossible to get strict I's and O's unless she either has a foley, or is able to get up. She is still on precedex, ativan, and phenobarbital for sedation/seizure precautions.

## 2023-10-28 NOTE — Progress Notes (Signed)
 Progress Note   Patient: [name] [name] FMW:[id number] DOB: 1955-09-26 DOA: 10/27/2023     1 DOS: the patient was seen and examined on 10/28/2023   Brief hospital admission narrative: [name] [name] is a 68 y.o. female with medical history significant of GERD, history of asthma/COPD, GERD, alcohol abuse, hyperlipidemia and history of inflammatory bowel disease; who presented to the hospital secondary to fall associated with syncope.  Patient ended up striking the back of her head and was complaining of left-sided headache. Acute images demonstrating no acute intracranial abnormalities.   Further workup showing significant hyponatremia and a potassium of 2.7   Per the husband's report patient drinking over 15 beers per day and sometimes also having some wine.  While in the ED patient experience seizure activity that was aborted with the use of Ativan.   Case discussed with nephrology and critical care; patient received hypertonic saline and was started on phenobarbital. Electrolytes started to be repleted and despite being postictal patient hemodynamically stable and protecting airways.  TRH consulted to place in the hospital for further evaluation and management.  Assessment and plan 1-acute metabolic encephalopathy/seizure - No further seizure activity appreciated - Still with ongoing encephalopathy process most likely associated with alcohol withdrawal. - Currently requiring phenobarbital detox and the use of Precedex - Continue as needed Haldol and Ativan -Continue supportive care and follow clinical response. - Continue seizure precaution.  2-hyponatremia - Resolved for the most part - Sodium 129 at 130 currently - Continue to follow electrolytes trend - Appreciate assistance and recommendation by nephrology service  3-GERD - Continue Pepcid   4-history of asthma/COPD - Continue Pulmicort  and as needed DuoNeb  5-alcohol abuse - Continue phenobarbital and Precedex - Folic  acid and thiamine has been ordered - Once medically stable will provide cessation counseling.  6-hypokalemia - Continue electrolyte repletion and follow trend - Magnesium  within normal limits currently.   Subjective:  Lethargic and currently not following commands.  Per nursing staff experiencing intermittent episodes of agitation and restlessness.  Physical Exam: Vitals:   10/28/23 1700 10/28/23 1715 10/28/23 1730 10/28/23 1745  BP: (!) 154/82 (!) 148/80 (!) 150/82 (!) 148/84  Pulse: (!) 56 (!) 57 (!) 54 (!) 56  Resp: 20 18 19 19   Temp:      TempSrc:      SpO2: 99% 98% 99% 98%  Weight:      Height:       General exam: Somnolent/lethargic in today's evaluation; per nursing staff intermittent episode of agitation and restlessness. Respiratory system: Good saturation on room air. Cardiovascular system:RRR.  No rubs, no gallops, no JVD. Gastrointestinal system: Abdomen is nondistended, soft and nontender.  Positive bowel sounds. Central nervous system: Moving 4 limbs spontaneously.  Limited examination secondary to encephalopathy. Extremities: No cyanosis or clubbing. Skin: No petechiae. Psychiatry: Examination secondary to encephalopathy.   Data Reviewed: Basic metabolic panel: Sodium 135, potassium 2.7, chloride 97, bicarb 27, glucose 129, BUN 9, creatinine 0.61 and anion gap 11.  Family Communication: No family at bedside.  Disposition: Status is: Inpatient Remains inpatient appropriate because: Continue IV therapy.  CRITICAL CARE Performed by: Eric Nunnery   Total critical care time: 55 minutes  Critical care time was exclusive of separately billable procedures and treating other patients.  Critical care was necessary to treat or prevent imminent or life-threatening deterioration.  Critical care was time spent personally by me on the following activities: development of treatment plan with patient and/or surrogate as well as nursing, discussions with  consultants,  evaluation of patient's response to treatment, examination of patient, obtaining history from patient or surrogate, ordering and performing treatments and interventions, ordering and review of laboratory studies, ordering and review of radiographic studies, pulse oximetry and re-evaluation of patient's condition.  Author: Eric Nunnery, MD 10/28/2023 6:01 PM  For on call review [email or web address].

## 2023-10-28 NOTE — Plan of Care (Signed)
  Problem: Education: Goal: Knowledge of General Education information will improve Description: Including pain rating scale, medication(s)/side effects and non-pharmacologic comfort measures Outcome: Progressing   Problem: Health Behavior/Discharge Planning: Goal: Ability to manage health-related needs will improve Outcome: Progressing   Problem: Clinical Measurements: Goal: Ability to maintain clinical measurements within normal limits will improve Outcome: Progressing Goal: Will remain free from infection Outcome: Progressing Goal: Diagnostic test results will improve Outcome: Progressing Goal: Respiratory complications will improve Outcome: Progressing Goal: Cardiovascular complication will be avoided Outcome: Progressing   Problem: Elimination: Goal: Will not experience complications related to bowel motility Outcome: Progressing Goal: Will not experience complications related to urinary retention Outcome: Progressing   Problem: Pain Managment: Goal: General experience of comfort will improve and/or be controlled Outcome: Progressing   Problem: Safety: Goal: Ability to remain free from injury will improve Outcome: Progressing   Problem: Skin Integrity: Goal: Risk for impaired skin integrity will decrease Outcome: Progressing   Problem: Activity: Goal: Risk for activity intolerance will decrease Outcome: Not Progressing   Problem: Nutrition: Goal: Adequate nutrition will be maintained Outcome: Not Progressing   Problem: Coping: Goal: Level of anxiety will decrease Outcome: Not Progressing

## 2023-10-29 DIAGNOSIS — E871 Hypo-osmolality and hyponatremia: Secondary | ICD-10-CM | POA: Diagnosis not present

## 2023-10-29 DIAGNOSIS — G9341 Metabolic encephalopathy: Secondary | ICD-10-CM | POA: Diagnosis not present

## 2023-10-29 DIAGNOSIS — E876 Hypokalemia: Secondary | ICD-10-CM | POA: Diagnosis not present

## 2023-10-29 DIAGNOSIS — R569 Unspecified convulsions: Secondary | ICD-10-CM | POA: Diagnosis not present

## 2023-10-29 LAB — BASIC METABOLIC PANEL WITH GFR
Anion gap: 11 (ref 5–15)
BUN: 6 mg/dL — ABNORMAL LOW (ref 8–23)
CO2: 25 mmol/L (ref 22–32)
Calcium: 9.1 mg/dL (ref 8.9–10.3)
Chloride: 92 mmol/L — ABNORMAL LOW (ref 98–111)
Creatinine, Ser: 0.41 mg/dL — ABNORMAL LOW (ref 0.44–1.00)
GFR, Estimated: >60 mL/min (ref >60)
Glucose, Bld: 92 mg/dL (ref 70–99)
Potassium: 3.1 mmol/L — ABNORMAL LOW (ref 3.5–5.1)
Sodium: 128 mmol/L — ABNORMAL LOW (ref 135–145)

## 2023-10-29 LAB — SODIUM
Sodium: 127 mmol/L — ABNORMAL LOW (ref 135–145)
Sodium: 129 mmol/L — ABNORMAL LOW (ref 135–145)
Sodium: 129 mmol/L — ABNORMAL LOW (ref 135–145)
Sodium: 129 mmol/L — ABNORMAL LOW (ref 135–145)
Sodium: 131 mmol/L — ABNORMAL LOW (ref 135–145)
Sodium: 132 mmol/L — ABNORMAL LOW (ref 135–145)
Sodium: 133 mmol/L — ABNORMAL LOW (ref 135–145)

## 2023-10-29 LAB — POTASSIUM: Potassium: 4.4 mmol/L (ref 3.5–5.1)

## 2023-10-29 LAB — MAGNESIUM: Magnesium: 1.4 mg/dL — ABNORMAL LOW (ref 1.7–2.4)

## 2023-10-29 MED ORDER — MAGNESIUM SULFATE 2 GM/50ML IV SOLN
2.0000 g | Freq: Once | INTRAVENOUS | Status: AC
  Administered 2023-10-29: 2 g via INTRAVENOUS
  Filled 2023-10-29: qty 50

## 2023-10-29 MED ORDER — POTASSIUM CHLORIDE CRYS ER 20 MEQ PO TBCR: 40.0000 meq | EXTENDED_RELEASE_TABLET | ORAL | Status: DC

## 2023-10-29 MED ORDER — POTASSIUM CHLORIDE 20 MEQ PO PACK
40.0000 meq | PACK | ORAL | Status: AC
  Administered 2023-10-29 (×2): 40 meq via ORAL
  Filled 2023-10-29 (×2): qty 2

## 2023-10-29 NOTE — Plan of Care (Signed)
 Precedex transfusing, phenobarbital optimized.   Problem: Safety: Goal: Ability to remain free from injury will improve Outcome: Progressing   Problem: Pain Managment: Goal: General experience of comfort will improve and/or be controlled Outcome: Progressing   Problem: Elimination: Goal: Will not experience complications related to urinary retention Outcome: Progressing   Problem: Skin Integrity: Goal: Risk for impaired skin integrity will decrease Outcome: Progressing   Problem: Coping: Goal: Level of anxiety will decrease Outcome: Progressing

## 2023-10-29 NOTE — Progress Notes (Signed)
 [facility] Progress Note  Name: [name] [name] MRN: [id number] DOB: June 13, 1955   Subjective:  She had 900 mL UOP over 6/24 as well as 6 other unmeasured urine voids.  She doesn't like all the questions.  History from patient is limited - doesn't know year.  Per nurse she is withdrawing and is on precedex.   Review of systems:  Limited by AMS Denies shortness of breath  Denies n/v Denies chest pain   Intake/Output Summary (Last 24 hours) at 10/29/2023 0857 Last data filed at 10/29/2023 0700 Gross per 24 hour  Intake 1548.77 ml  Output 900 ml  Net 648.77 ml    Vitals:  Vitals:   10/29/23 0600 10/29/23 0743 10/29/23 0800 10/29/23 0804  BP: (!) 141/73  (!) 159/97   Pulse: (!) 55  (!) 57   Resp: 13  18   Temp:  98.5 F (36.9 C)    TempSrc:  Oral    SpO2: 99%  98% 97%  Weight:      Height:         Physical Exam:  General adult female in bed in no acute distress HEENT normocephalic atraumatic extraocular movements intact sclera anicteric Neck supple trachea midline Lungs clear to auscultation bilaterally normal work of breathing at rest  Heart S1S2 no rub Abdomen soft nontender nondistended Extremities no edema  Psych no anxiety and normal affect on exam but on precedex Neuro - oriented to person and to location of hospital/[place] but not able to provide year 19... and trails off after two guesses GU no foley    Medications reviewed   Labs:     Latest Ref Rng & Units 10/29/2023    8:08 AM 10/29/2023    6:36 AM 10/29/2023    4:24 AM  BMP  Glucose 70 - 99 mg/dL   92   BUN 8 - 23 mg/dL   6   Creatinine 9.55 - 1.00 mg/dL   9.58   Sodium 864 - 854 mmol/L 131  129  128   Potassium 3.5 - 5.1 mmol/L   3.1   Chloride 98 - 111 mmol/L   92   CO2 22 - 32 mmol/L   25   Calcium  8.9 - 10.3 mg/dL   9.1      Assessment/Plan:    Hyponatremia -treating as chronic as we do not have prior serum sodiums before this hospitalization. Symptomatic  hyponatremia secondary to low solute intake/potomania with a likely reset osmostat as well.  She initially rapidly corrected and was given D5 as well as DDAVP.  Had DDAVP again on 6/24.   Space sodium checks to every 4 hours   Hypokalemia - 40 meq potassium x 2 today.  Replete mag IV today.   Seizures secondary to hyponatremia and treated with Ativan initially; should stabilize with improvement in SNa. Alcohol abuse - note electrolyte derangements above.  Monitoring and treatment for withdrawal per primary team.  She is on precedex. Renal panel daily  Disposition - hope to space to daily sodium monitoring tomorrow   Katheryn JAYSON Saba, MD 10/29/2023 9:13 AM

## 2023-10-29 NOTE — Plan of Care (Signed)
  Problem: Education: Goal: Knowledge of General Education information will improve Description: Including pain rating scale, medication(s)/side effects and non-pharmacologic comfort measures Outcome: Progressing   Problem: Clinical Measurements: Goal: Ability to maintain clinical measurements within normal limits will improve Outcome: Progressing   Problem: Coping: Goal: Level of anxiety will decrease Outcome: Progressing   

## 2023-10-29 NOTE — Progress Notes (Signed)
 Progress Note   Patient: [name] [name] FMW:[id number] DOB: 17-Jun-1955 DOA: 10/27/2023     2 DOS: the patient was seen and examined on 10/29/2023   Brief hospital admission narrative: [name] [name] is a 68 y.o. female with medical history significant of GERD, history of asthma/COPD, GERD, alcohol abuse, hyperlipidemia and history of inflammatory bowel disease; who presented to the hospital secondary to fall associated with syncope.  Patient ended up striking the back of her head and was complaining of left-sided headache. Acute images demonstrating no acute intracranial abnormalities.   Further workup showing significant hyponatremia and a potassium of 2.7   Per the husband's report patient drinking over 15 beers per day and sometimes also having some wine.  While in the ED patient experience seizure activity that was aborted with the use of Ativan.   Case discussed with nephrology and critical care; patient received hypertonic saline and was started on phenobarbital. Electrolytes started to be repleted and despite being postictal patient hemodynamically stable and protecting airways.  TRH consulted to place in the hospital for further evaluation and management.  Assessment and plan 1-acute metabolic encephalopathy/seizure - No further seizure activity appreciated - Still with ongoing encephalopathy process most likely associated with alcohol withdrawal. - Currently requiring phenobarbital detox and the use of Precedex. - Continue as needed Haldol and Ativan -Continue supportive care and follow clinical response. - Continue seizure precaution (no further seizure seen).  2-hyponatremia - Resolved for the most part; most likely asymptomatic from electrolyte disturbances at the moment. - Sodium 128>>133 currently - Continue to follow electrolytes trend - Appreciate assistance and recommendation by nephrology service.  3-GERD - Continue Pepcid  prophylactically. - As needed antiemetics  has been ordered.  4-history of asthma/COPD - Continue Pulmicort  and as needed DuoNeb - No wheezing and normal respiratory effort appreciated on exam. - Using accessory muscle.  5-alcohol abuse - Continue phenobarbital and Precedex - Continue folic acid  and thiamine - Cessation counseling provided.  6-hypokalemia - Continue electrolyte repletion and follow trend - Magnesium  within normal limits currently. - Continue telemetry monitoring.   Subjective:  Still restless and at times mildly agitated; no chest pain, no nausea, no vomiting.  Oriented x 2 and definitely more interactive on today's examination.  Still requiring Precedex and is in the process of being weaned off from phenobarbital detox.  Physical Exam: Vitals:   10/29/23 1500 10/29/23 1558 10/29/23 1601 10/29/23 1700  BP: (!) 164/81 (!) 166/82  (!) 162/85  Pulse: (!) 43 (!) 53  (!) 48  Resp: 16   (!) 21  Temp:   97.6 F (36.4 C)   TempSrc:   Axillary   SpO2: 99%   98%  Weight:      Height:       General exam: Alert, awake, oriented x 2 intermittently; expressing to be hungry and denying any chest pain or shortness of breath.  No further seizure activity appreciated. Respiratory system: Good saturation on room air. Cardiovascular system:RRR. No rubs or gallops; no JVD. Gastrointestinal system: Abdomen is nondistended, soft and nontender.  Positive bowel sounds. Central nervous system: Moving 4 limbs spontaneously.  No focal neurological deficits. Extremities: No cyanosis or clubbing; trace pedal edema appreciated bilateral. Skin: No petechiae. Psychiatry: Judgement and insight appear impaired secondary to ongoing encephalopathy (improving).  Data Reviewed: Basic metabolic panel: Sodium 128, potassium 3.1, chloride 92, bicarb 25, BUN 6, creatinine 0.41 and GFR >60  Family Communication: No family at bedside.  Disposition: Status is: Inpatient Remains inpatient  appropriate because: Continue IV  therapy.  CRITICAL CARE Performed by: Eric Nunnery   Total critical care time: 55 minutes  Critical care time was exclusive of separately billable procedures and treating other patients.  Critical care was necessary to treat or prevent imminent or life-threatening deterioration.  Critical care was time spent personally by me on the following activities: development of treatment plan with patient and/or surrogate as well as nursing, discussions with consultants, evaluation of patient's response to treatment, examination of patient, obtaining history from patient or surrogate, ordering and performing treatments and interventions, ordering and review of laboratory studies, ordering and review of radiographic studies, pulse oximetry and re-evaluation of patient's condition.   Author: Eric Nunnery, MD 10/29/2023 6:30 PM  For on call review [email or web address].

## 2023-10-30 DIAGNOSIS — F10932 Alcohol use, unspecified with withdrawal with perceptual disturbance: Secondary | ICD-10-CM | POA: Diagnosis not present

## 2023-10-30 DIAGNOSIS — E876 Hypokalemia: Secondary | ICD-10-CM | POA: Diagnosis not present

## 2023-10-30 DIAGNOSIS — R569 Unspecified convulsions: Secondary | ICD-10-CM | POA: Diagnosis not present

## 2023-10-30 DIAGNOSIS — E871 Hypo-osmolality and hyponatremia: Secondary | ICD-10-CM | POA: Diagnosis not present

## 2023-10-30 LAB — SODIUM
Sodium: 134 mmol/L — ABNORMAL LOW (ref 135–145)
Sodium: 132 mmol/L — ABNORMAL LOW (ref 135–145)
Sodium: 133 mmol/L — ABNORMAL LOW (ref 135–145)

## 2023-10-30 LAB — RENAL FUNCTION PANEL
Albumin: 3.3 g/dL — ABNORMAL LOW (ref 3.5–5.0)
Anion gap: 13 (ref 5–15)
BUN: 12 mg/dL (ref 8–23)
CO2: 20 mmol/L — ABNORMAL LOW (ref 22–32)
Calcium: 9 mg/dL (ref 8.9–10.3)
Chloride: 100 mmol/L (ref 98–111)
Creatinine, Ser: 0.59 mg/dL (ref 0.44–1.00)
GFR, Estimated: >60 mL/min (ref >60)
Glucose, Bld: 100 mg/dL — ABNORMAL HIGH (ref 70–99)
Phosphorus: 4.8 mg/dL — ABNORMAL HIGH (ref 2.5–4.6)
Potassium: 3.5 mmol/L (ref 3.5–5.1)
Sodium: 133 mmol/L — ABNORMAL LOW (ref 135–145)

## 2023-10-30 LAB — MAGNESIUM: Magnesium: 2.1 mg/dL (ref 1.7–2.4)

## 2023-10-30 MED ORDER — ENSURE PLUS HIGH PROTEIN PO LIQD
237.0000 mL | Freq: Two times a day (BID) | ORAL | Status: DC
  Administered 2023-10-30 – 2023-10-31 (×3): 237 mL via ORAL

## 2023-10-30 NOTE — Progress Notes (Signed)
 Progress Note   Patient: SHAQUANDA GRAVES FMW:[id number] DOB: 01-Oct-1955 DOA: 10/27/2023     3 DOS: the patient was seen and examined on 10/30/2023   Brief hospital admission narrative: PRISCILA BEAN is a 68 y.o. female with medical history significant of GERD, history of asthma/COPD, GERD, alcohol abuse, hyperlipidemia and history of inflammatory bowel disease; who presented to the hospital secondary to fall associated with syncope.  Patient ended up striking the back of her head and was complaining of left-sided headache. Acute images demonstrating no acute intracranial abnormalities.   Further workup showing significant hyponatremia and a potassium of 2.7   Per the husband's report patient drinking over 15 beers per day and sometimes also having some wine.  While in the ED patient experience seizure activity that was aborted with the use of Ativan.   Case discussed with nephrology and critical care; patient received hypertonic saline and was started on phenobarbital. Electrolytes started to be repleted and despite being postictal patient hemodynamically stable and protecting airways.  TRH consulted to place in the hospital for further evaluation and management.  Assessment and plan 1-acute metabolic encephalopathy/seizure - No further seizure activity appreciated - Still with ongoing encephalopathy process most likely associated with alcohol withdrawal; but significantly improved.. - Currently requiring Precedex.  Has completed phenobarbital detox as recommended by critical care.. - Continue as needed Haldol and Ativan -Continue supportive care and follow clinical response. - Continue seizure precaution (no further seizure seen).  2-hyponatremia - Resolved for the most part; most likely asymptomatic from electrolyte disturbances at the moment. -Patient received 2 doses of DDAVP - Sodium 128>>133 currently and has remained stable - Continue to follow electrolytes trend  intermittently. - Appreciate assistance and recommendation by nephrology service.  3-GERD - Continue Pepcid  prophylactically. - As needed antiemetics has been ordered.  4-history of asthma/COPD - Continue Pulmicort  and as needed DuoNeb - No wheezing and normal respiratory effort appreciated on exam. - No using accessory muscle.  5-alcohol abuse - Continue phenobarbital and Precedex - Continue folic acid  and thiamine - Cessation counseling provided. - TOC to provide outpatient resources to help patient Quitting.  6-hypokalemia - Continue electrolyte repletion and follow trend - Magnesium  within normal limits currently. - Continue telemetry monitoring. - K levels currently stable.   Subjective:  Less restless and less agitated; no seizure, no nausea, no vomiting, no chest pain or shortness of breath.  Patient is afebrile.  So far tolerating diet.  Off phenobarbital and almost completing weaning off process from Precedex.  Physical Exam: Vitals:   10/30/23 1400 10/30/23 1500 10/30/23 1600 10/30/23 1700  BP: 132/73 136/67 (!) 117/59 105/67  Pulse: 71 74  87  Resp: 17 16 15  (!) 21  Temp:      TempSrc:      SpO2: 99% 99%  97%  Weight:      Height:       General exam: Alert, awake, oriented x 2; more interactive and able to follow commands.  No further seizure activity appreciated.  Denies chest pain, nausea or vomiting. Respiratory system: No wheezing or crackles; good saturation on room air. Cardiovascular system:RRR. No rubs or gallops; no JVD. Gastrointestinal system: Abdomen is nondistended, soft and nontender. No organomegaly or masses felt. Normal bowel sounds heard. Central nervous system: Moving 4 limbs spontaneously.  No focal neurological deficits. Extremities: No C/C/E, +pedal pulses Skin: No petechiae. Psychiatry: Judgement and insight appear significantly improved in comparison to admission mentation.  No suicidal ideation or hallucination.  Data  Reviewed: Renal function panel: Sodium 133, potassium 3.5, chloride 100, bicarb 20, BUN 12, creatinine 0.59 and GFR >60 Magnesium : 2.1  Family Communication: No family at bedside.  Unable to reach her husband over the phone.  Disposition: Status is: Inpatient Remains inpatient appropriate because: Continue IV therapy.  CRITICAL CARE Performed by: Eric Nunnery   Total critical care time: 50 minutes  Critical care time was exclusive of separately billable procedures and treating other patients.  Critical care was necessary to treat or prevent imminent or life-threatening deterioration.  Critical care was time spent personally by me on the following activities: development of treatment plan with patient and/or surrogate as well as nursing, discussions with consultants, evaluation of patient's response to treatment, examination of patient, obtaining history from patient or surrogate, ordering and performing treatments and interventions, ordering and review of laboratory studies, ordering and review of radiographic studies, pulse oximetry and re-evaluation of patient's condition.    Author: Eric Nunnery, MD 10/30/2023 5:58 PM  For on call review [email or web address].

## 2023-10-30 NOTE — Progress Notes (Signed)
 [name] [name] is an 68 y.o. female with a history of heavy EtOH abuse including 12-15 beers a day, 2 to 3 glasses of wine presenting with a witnessed seizure, encephalopathy found to be hyponatremic with a sodium level 117, potassium 2.7 and magnesium  1.5.  Initially treated with 1 L of normal saline as well as 3% which was given.   Assessment/Plan: Hyponatremia -treating as chronic as we do not have prior serum sodiums before this hospitalization.  Hyponatremia secondary to low solute intake/potomania with a likely reset osmostat as well. Goal was 125 x 6 a.m. 6/24 (80mmol/L in 1st 24hrs)  but overcorrected to 140 and then given hypotonic fluid as well as DDAVP.  Serum sodium was 130 at 4 PM on 6/23 peaking at 141 at 6 almost 8 PM 6/23 and then slowly downtrending to 133 at 6:30 AM 6/24. DDAVP given at 936PM on 6/23.  - Much more stable and did not require any more DDAVP (received 2x).  Sodium levels 133 this morning as well as yesterday.    Signing off at this time; please reconsult as needed.  Hypokalemia -repleted. Seizures secondary to hyponatremia and treated with Ativan initially; stabilized with improvement in SNa. H/o COPD  Subjective: Overly rapid correction -> DDAVP and D5W -> watch more stable today and spouse who was bedside updated.  Patient had some nausea yesterday but no vomiting.  Patient denies any nausea this morning, denies shortness of breath/chest pain/headaches.    Chemistry and CBC: Creat  Date/Time Value Ref Range Status  07/02/2019 11:34 AM 0.53 0.50 - 0.99 mg/dL Final    Comment:    For patients >69 years of age, the reference limit for Creatinine is approximately 13% higher for people identified as African-American. .   02/26/2019 10:06 AM 0.43 (L) 0.50 - 0.99 mg/dL Final    Comment:    For patients >39 years of age, the reference limit for Creatinine is approximately 13% higher for people identified as African-American. SABRA   11/10/2018 11:38 AM 0.55 0.50  - 0.99 mg/dL Final    Comment:    For patients >23 years of age, the reference limit for Creatinine is approximately 13% higher for people identified as African-American. SABRA   12/23/2017 11:50 AM 0.47 (L) 0.50 - 0.99 mg/dL Final    Comment:    For patients >68 years of age, the reference limit for Creatinine is approximately 13% higher for people identified as African-American. SABRA   06/25/2017 11:13 AM 0.40 (L) 0.50 - 1.05 mg/dL Final    Comment:    For patients >7 years of age, the reference limit for Creatinine is approximately 13% higher for people identified as African-American. SABRA   06/24/2016 10:13 AM 0.49 (L) 0.50 - 1.05 mg/dL Final    Comment:      For patients > or = 66 years of age: The upper reference limit for Creatinine is approximately 13% higher for people identified as African-American.     07/04/2015 08:49 AM 0.58 0.50 - 1.05 mg/dL Final  91/76/7983 87:42 PM 0.42 (L) 0.50 - 1.05 mg/dL Final  95/95/7983 88:71 AM 0.43 (L) 0.50 - 1.10 mg/dL Final  93/74/7984 89:64 AM 0.60 0.50 - 1.10 mg/dL Final  93/77/7984 89:86 AM 0.50 0.50 - 1.10 mg/dL Final  87/91/7985 87:89 PM 0.51 0.50 - 1.10 mg/dL Final  91/91/7985 89:54 AM 0.54 0.50 - 1.10 mg/dL Final  96/68/7985 96:91 PM 0.65 0.50 - 1.10 mg/dL Final  88/94/7987 98:84 PM 0.52 0.50 - 1.10 mg/dL  Final   Creatinine, Ser  Date/Time Value Ref Range Status  10/30/2023 04:41 AM 0.59 0.44 - 1.00 mg/dL Final  93/74/7974 95:75 AM 0.41 (L) 0.44 - 1.00 mg/dL Final  93/75/7974 95:59 AM 0.61 0.44 - 1.00 mg/dL Final  93/76/7974 91:42 AM 0.80 0.44 - 1.00 mg/dL Final  93/76/7974 93:81 AM 0.69 0.44 - 1.00 mg/dL Final  89/89/7975 89:49 AM 0.47 (L) 0.57 - 1.00 mg/dL Final  96/81/7975 88:73 AM 0.48 (L) 0.57 - 1.00 mg/dL Final  91/75/7976 88:57 AM 0.46 (L) 0.57 - 1.00 mg/dL Final  89/92/7977 88:95 AM 0.65 0.57 - 1.00 mg/dL Final  91/94/7977 97:97 PM 0.60 0.44 - 1.00 mg/dL Final  90/72/7978 91:55 AM 0.42 (L) 0.44 - 1.00 mg/dL Final   96/86/7979 93:90 AM 0.84 0.44 - 1.00 mg/dL Final  96/94/7979 88:74 AM 0.55 0.44 - 1.00 mg/dL Final  98/83/7981 87:65 PM 0.46 0.44 - 1.00 mg/dL Final  89/88/7982 87:47 PM 0.36 (L) 0.44 - 1.00 mg/dL Final  89/93/7982 98:70 PM 0.53 0.44 - 1.00 mg/dL Final  94/76/7983 89:44 AM 0.44 0.44 - 1.00 mg/dL Final  88/98/7984 87:85 PM 0.38 (L) 0.50 - 1.10 mg/dL Final  96/76/7984 98:88 PM 0.50 0.50 - 1.10 mg/dL Final  97/78/7985 98:47 PM 0.58 0.50 - 1.10 mg/dL Final  91/74/7986 93:81 AM 0.52 0.50 - 1.10 mg/dL Final  91/75/7986 93:84 AM 0.52 0.50 - 1.10 mg/dL Final  91/76/7986 96:72 AM 0.42 (L) 0.50 - 1.10 mg/dL Final  91/83/7986 87:99 PM 0.46 (L) 0.50 - 1.10 mg/dL Final  92/85/7988 91:50 PM 0.67 0.40 - 1.20 mg/dL Final  89/98/7989 87:80 PM 0.54 0.4 - 1.2 mg/dL Final  91/80/7989 89:85 PM 0.58 0.40 - 1.20 mg/dL Final  93/85/7989 97:84 PM 0.50 0.4 - 1.2 mg/dL Final  95/80/7989 96:88 PM 0.50 0.4 - 1.2 mg/dL Final  96/77/7989 95:88 PM 0.56 0.40 - 1.20 mg/dL Final  98/86/7989 91:83 PM 0.71 0.40 - 1.20 mg/dL Final  87/98/7990 97:48 AM 0.73 0.40 - 1.20 mg/dL Final  88/86/7990 89:98 AM 0.62 0.40 - 1.20 mg/dL Final  88/88/7990 90:67 AM 0.61 0.40 - 1.20 mg/dL Final  93/89/7990 94:93 AM 0.64 0.40 - 1.20 mg/dL Final  89/96/7991 92:90 PM 0.71 0.40 - 1.20 mg/dL Final   Recent Labs  Lab 10/27/23 0618 10/27/23 0857 10/27/23 0921 10/27/23 1557 10/27/23 1730 10/28/23 0440 10/28/23 0627 10/28/23 1048 10/28/23 1250 10/29/23 0424 10/29/23 0636 10/29/23 1228 10/29/23 1719 10/29/23 2116 10/30/23 0100 10/30/23 0439 10/30/23 0441 10/30/23 0840  NA 117* 122*   < > 130*   < > 135   < > 130*   < > 128*   < > 129* 133* 132* 134* 132* 133* 133*  K 2.7* 3.4*  --   --   --  2.7*  --  4.3  --  3.1*  --  4.4  --   --   --   --  3.5  --   CL 82* 85*  --   --   --  97*  --   --   --  92*  --   --   --   --   --   --  100  --   CO2 22 27  --   --   --  27  --   --   --  25  --   --   --   --   --   --  20*  --    GLUCOSE 117* 78  --   --   --  129*  --   --   --  92  --   --   --   --   --   --  100*  --   BUN 14 15  --   --   --  9  --   --   --  6*  --   --   --   --   --   --  12  --   CREATININE 0.69 0.80  --   --   --  0.61  --   --   --  0.41*  --   --   --   --   --   --  0.59  --   CALCIUM  8.1* 8.3*  --   --   --  9.1  --   --   --  9.1  --   --   --   --   --   --  9.0  --   PHOS  --   --   --  3.3  --   --   --   --   --   --   --   --   --   --   --   --  4.8*  --    < > = values in this interval not displayed.   Recent Labs  Lab 10/27/23 0618  WBC 6.8  HGB 15.3*  HCT 40.9  MCV 95.1  PLT 129*   Liver Function Tests: Recent Labs  Lab 10/30/23 0441  ALBUMIN 3.3*   No results for input(s): LIPASE, AMYLASE in the last 168 hours. No results for input(s): AMMONIA in the last 168 hours. Cardiac Enzymes: No results for input(s): CKTOTAL, CKMB, CKMBINDEX, TROPONINI in the last 168 hours. Iron Studies: No results for input(s): IRON, TIBC, TRANSFERRIN, FERRITIN in the last 72 hours. PT/INR: @LABRCNTIP (inr:5)  Xrays/Other Studies: ) Results for orders placed or performed during the hospital encounter of 10/27/23 (from the past 48 hours)  Sodium     Status: Abnormal   Collection Time: 10/28/23 10:48 AM  Result Value Ref Range   Sodium 130 (L) 135 - 145 mmol/L    Comment: Performed at [facility], [address], [place], [place] [address]  Potassium     Status: None   Collection Time: 10/28/23 10:48 AM  Result Value Ref Range   Potassium 4.3 3.5 - 5.1 mmol/L    Comment: Performed at [facility], [address], [place], [place] [address]  Sodium     Status: Abnormal   Collection Time: 10/28/23 12:50 PM  Result Value Ref Range   Sodium 129 (L) 135 - 145 mmol/L    Comment: Performed at [facility], [address], [place], [place] [address]  Sodium     Status: Abnormal   Collection Time: 10/28/23  6:06 PM  Result Value Ref Range   Sodium 127 (L) 135 - 145  mmol/L    Comment: Performed at [facility], [address], [place], [place] [address]  Sodium     Status: Abnormal   Collection Time: 10/28/23  7:46 PM  Result Value Ref Range   Sodium 127 (L) 135 - 145 mmol/L    Comment: Performed at [facility], [address] [place], [place] [address]  Sodium     Status: Abnormal   Collection Time: 10/28/23 10:00 PM  Result Value Ref Range   Sodium 126 (L) 135 - 145 mmol/L  Comment: Performed at [facility], [address], [place], [place] [address]  Sodium     Status: Abnormal   Collection Time: 10/29/23 12:32 AM  Result Value Ref Range   Sodium 127 (L) 135 - 145 mmol/L    Comment: Performed at [facility], [address], [place], [place] [address]  Sodium     Status: Abnormal   Collection Time: 10/29/23  2:20 AM  Result Value Ref Range   Sodium 129 (L) 135 - 145 mmol/L    Comment: Performed at [facility], [address] [place], [place] [address]  Basic metabolic panel with GFR     Status: Abnormal   Collection Time: 10/29/23  4:24 AM  Result Value Ref Range   Sodium 128 (L) 135 - 145 mmol/L   Potassium 3.1 (L) 3.5 - 5.1 mmol/L   Chloride 92 (L) 98 - 111 mmol/L   CO2 25 22 - 32 mmol/L   Glucose, Bld 92 70 - 99 mg/dL    Comment: Glucose reference range applies only to samples taken after fasting for at least 8 hours.   BUN 6 (L) 8 - 23 mg/dL   Creatinine, Ser 9.58 (L) 0.44 - 1.00 mg/dL   Calcium  9.1 8.9 - 10.3 mg/dL   GFR, Estimated >39 >39 mL/min    Comment: (NOTE) Calculated using the CKD-EPI Creatinine Equation (2021)    Anion gap 11 5 - 15    Comment: Performed at [facility], [address], [place], [place] [address]  Magnesium      Status: Abnormal   Collection Time: 10/29/23  4:24 AM  Result Value Ref Range   Magnesium  1.4 (L) 1.7 - 2.4 mg/dL    Comment: Performed at [facility], [address] [place], [place] [address]  Sodium     Status: Abnormal   Collection Time: 10/29/23  6:36 AM  Result Value Ref Range    Sodium 129 (L) 135 - 145 mmol/L    Comment: Performed at [facility], [address], [place], [place] [address]  Sodium     Status: Abnormal   Collection Time: 10/29/23  8:08 AM  Result Value Ref Range   Sodium 131 (L) 135 - 145 mmol/L    Comment: Performed at [facility], [address] [place], [place] [address]  Sodium     Status: Abnormal   Collection Time: 10/29/23 12:28 PM  Result Value Ref Range   Sodium 129 (L) 135 - 145 mmol/L    Comment: Performed at [facility], [address] [place], [place] [address]  Potassium     Status: None   Collection Time: 10/29/23 12:28 PM  Result Value Ref Range   Potassium 4.4 3.5 - 5.1 mmol/L    Comment: Performed at [facility], [address] [place], [place] [address]  Sodium     Status: Abnormal   Collection Time: 10/29/23  5:19 PM  Result Value Ref Range   Sodium 133 (L) 135 - 145 mmol/L    Comment: Performed at [facility], [address], [place], [place] [address]  Sodium     Status: Abnormal   Collection Time: 10/29/23  9:16 PM  Result Value Ref Range   Sodium 132 (L) 135 - 145 mmol/L    Comment: Performed at [facility], [address], [place], [place] [address]  Sodium     Status: Abnormal   Collection Time: 10/30/23  1:00 AM  Result Value Ref Range   Sodium 134 (L) 135 -  145 mmol/L    Comment: Performed at [facility], [address], [place], [place] [address]  Magnesium      Status: None   Collection Time: 10/30/23  4:39 AM  Result Value Ref Range   Magnesium  2.1 1.7 - 2.4 mg/dL    Comment: Performed at [facility], [address] [place], [place] [address]  Sodium     Status: Abnormal   Collection Time: 10/30/23  4:39 AM  Result Value Ref Range   Sodium 132 (L) 135 - 145 mmol/L    Comment: Performed at [facility], [address] [place], [place] [address]  Renal function panel     Status: Abnormal   Collection Time: 10/30/23  4:41 AM  Result Value Ref Range   Sodium 133 (L) 135 - 145 mmol/L   Potassium 3.5  3.5 - 5.1 mmol/L   Chloride 100 98 - 111 mmol/L   CO2 20 (L) 22 - 32 mmol/L   Glucose, Bld 100 (H) 70 - 99 mg/dL    Comment: Glucose reference range applies only to samples taken after fasting for at least 8 hours.   BUN 12 8 - 23 mg/dL   Creatinine, Ser 9.40 0.44 - 1.00 mg/dL   Calcium  9.0 8.9 - 10.3 mg/dL   Phosphorus 4.8 (H) 2.5 - 4.6 mg/dL   Albumin 3.3 (L) 3.5 - 5.0 g/dL   GFR, Estimated >39 >39 mL/min    Comment: (NOTE) Calculated using the CKD-EPI Creatinine Equation (2021)    Anion gap 13 5 - 15    Comment: Performed at [facility], [address], [place], [place] [address]  Sodium     Status: Abnormal   Collection Time: 10/30/23  8:40 AM  Result Value Ref Range   Sodium 133 (L) 135 - 145 mmol/L    Comment: Performed at [facility], [address], [place], [place] [address]   *Note: Due to a large number of results and/or encounters for the requested time period, some results have not been displayed. A complete set of results can be found in Results Review.   No results found.   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   Crohn disease (HCC)    CTS (carpal tunnel syndrome)    Diarrhea MULITIFACTORIAL   IBS, LACTOSE INTOLERANCE, SBBO, BILE-SALT   Elevated liver enzymes 2009: BMI 24 ?Etoh ALT 94, AST 51,  NEG ANA, qIGs& ASMA   MAY 2012 AST 52 ALT 38   Esophageal stricture 2009   GERD (gastroesophageal reflux disease)    Hemorrhoid    Hyperlipemia    Hypertension    Inflammatory bowel disease 2006 CD   SURGICAL REMISSION   LBP (low back pain)    Mental disorder    Migraine    Osteoporosis    PONV (postoperative nausea and vomiting)    Shortness of breath    exertion and humidity   Shoulder pain    right   Sleep apnea    Stop Bang Cock score of 4    PSH:   Past  Surgical History:  Procedure Laterality Date   BACK SURGERY     BACK SURGERY  07/16/2018   BILATERAL SALPINGOOPHORECTOMY     BIOPSY  12/24/2011   Procedure: BIOPSY;  Surgeon: Margo LITTIE Haddock, MD;  Location: [facility];  Service: Endoscopy;;  Gastric Biopsies   BIOPSY N/A 06/30/2012   Procedure: BIOPSY;  Surgeon: Margo LITTIE Haddock, MD;  Location: [facility];  Service: Endoscopy;  Laterality: N/A;  Gastric and Esophageal Biopsies   BIOPSY  06/23/2023   Procedure: BIOPSY;  Surgeon: Cindie Carlin POUR, DO;  [facility];  Service: Endoscopy;;   CARPAL TUNNEL RELEASE  LEFT   CATARACT EXTRACTION W/PHACO Right 07/30/2016   Procedure: CATARACT EXTRACTION PHACO AND INTRAOCULAR LENS PLACEMENT (IOC);  Surgeon: Oneil Platts, MD;  Location: [facility];  Service: Ophthalmology;  Laterality: Right;  CDE: 5.45   CATARACT EXTRACTION W/PHACO Left 08/13/2016   Procedure: CATARACT EXTRACTION PHACO AND INTRAOCULAR LENS PLACEMENT (IOC);  Surgeon: Oneil Platts, MD;  Location: [facility];  Service: Ophthalmology;  Laterality: Left;  CDE: 5.59   COLON SURGERY     COLONOSCOPY  JAN 2009 DIARRHEA, ABD PAIN, BRBPR   ANASTOMOTIC ULCER(3MM), IH Ak:AZWPHW ULCER, NL COLON bX   COLONOSCOPY WITH PROPOFOL  N/A 04/21/2018   examined portion of the ileum normal, two 3-5 mm polyps, diverticulosis in the rectosigmoid and sigmoid colon, external and internal hemorrhoids, significant looping of the colon.  Pathology with tubular adenomas.  Recommendations to follow high-fiber diet and repeat colonoscopy in 5 to 10 years with peds colonoscope.   COLONOSCOPY WITH PROPOFOL  N/A 06/23/2023   Procedure: COLONOSCOPY WITH PROPOFOL ;  Surgeon: Cindie Carlin POUR, DO;  [facility];  Service: Endoscopy;  Laterality: N/A;  10:00 am, asa 3   DILATION AND CURETTAGE OF UTERUS     ESOPHAGEAL DILATION  12/24/2011   SLF: A stricture was found in the distal esophagus/ Moderate gastritis/ MILD Duodenitis   ESOPHAGEAL DILATION N/A 06/23/2023   Procedure:  ESOPHAGEAL DILATION;  Surgeon: Cindie Carlin POUR, DO;  [facility];  Service: Endoscopy;  Laterality: N/A;   ESOPHAGOGASTRODUODENOSCOPY  02/07/09   mild gastritis   ESOPHAGOGASTRODUODENOSCOPY N/A 09/11/2023   Procedure: EGD (ESOPHAGOGASTRODUODENOSCOPY);  Surgeon: Cindie Carlin POUR, DO;  [facility];  Service: Endoscopy;  Laterality: N/A;  8:15 AM, ASA 3   ESOPHAGOGASTRODUODENOSCOPY (EGD) WITH PROPOFOL  N/A 06/30/2012   SLF: 1. No definite stricture appreciated 2. small hiatal hernia 3. Gastritis   ESOPHAGOGASTRODUODENOSCOPY (EGD) WITH PROPOFOL  N/A 02/20/2016   Procedure: ESOPHAGOGASTRODUODENOSCOPY (EGD) WITH PROPOFOL ;  Surgeon: Margo LITTIE Haddock, MD;  [facility];  Service: Endoscopy;  Laterality: N/A;  930   ESOPHAGOGASTRODUODENOSCOPY (EGD) WITH PROPOFOL  N/A 11/24/2018   benign-appearing esophageal peptic stricture s/p dilation, small hiatal hernia, moderate erosive gastritis.    ESOPHAGOGASTRODUODENOSCOPY (EGD) WITH PROPOFOL  N/A 06/23/2023   Procedure: ESOPHAGOGASTRODUODENOSCOPY (EGD) WITH PROPOFOL ;  Surgeon: Cindie Carlin POUR, DO;  [facility];  Service: Endoscopy;  Laterality: N/A;   EYE SURGERY     gum flap Bilateral    HEMICOLECTOMY  RIGHT 2006   HERNIA REPAIR     LUMBAR DISC SURGERY     POLYPECTOMY  04/21/2018   Procedure: POLYPECTOMY;  Surgeon: Haddock Margo LITTIE, MD;  [facility];  Service: Endoscopy;;  (colon)   SAVORY DILATION N/A 06/30/2012   Procedure: SAVORY DILATION;  Surgeon: Margo LITTIE Haddock, MD;  Location: [facility];  Service: Endoscopy;  Laterality: N/A;  12.34mm , 14mm, 15mm   SAVORY DILATION N/A 02/20/2016   Procedure: SAVORY DILATION;  Surgeon: Margo LITTIE Haddock, MD;  [facility];  Service: Endoscopy;  Laterality: N/A;  SAVORY DILATION N/A 11/24/2018   Procedure: SAVORY DILATION;  Surgeon: Harvey Margo CROME, MD;  [facility];  Service: Endoscopy;  Laterality: N/A;   UPPER GASTROINTESTINAL ENDOSCOPY  JAN 2009  ABD PAIN   GASTRITIS, ESO RING   UPPER GASTROINTESTINAL ENDOSCOPY  OCT 2010 ABD PAIN, DYSPHAGIA   DIL , GASTRITIS, NL DUODENUM   UPPER GASTROINTESTINAL ENDOSCOPY  OCT 2010 DYSPHAGIA   DIL 17 MM, GASTRITIS, NL DUODENUM   vocal cord surgery  Sep 20, 2010   precancerous areas removed   wisdom tooth extraction Right    pin placement due to broken jaw    Allergies:  Allergies  Allergen Reactions   Naproxen Shortness Of Breath    Chest pain   Dicyclomine  Hcl Other (See Comments)    Vision changes   Duloxetine Other (See Comments)    Suicidal    Egg-Derived Products Diarrhea    abd pain   Lovastatin Other (See Comments)    Chest pain   Toradol  [Ketorolac  Tromethamine ] Other (See Comments)    Headache. Non-effective   Tramadol Other (See Comments)    Headache. Non-effective.   Nexium  [Esomeprazole  Magnesium ] Diarrhea    Abdominal pain    Medications:   Prior to Admission medications   Medication Sig Start Date End Date Taking? Authorizing Provider  amLODipine  (NORVASC ) 10 MG tablet Take 1 tablet (10 mg total) by mouth daily. 08/15/23  Yes Melvenia Manus BRAVO, MD  benzonatate  (TESSALON ) 100 MG capsule Take 1 capsule by mouth twice daily 09/25/23  Yes Dixon, Phillip E, MD  Buprenorphine HCl-Naloxone HCl 4-1 MG FILM Place 1 Film under the tongue 4 (four) times daily. 07/23/23  Yes [provider]  calcium  carbonate (TUMS - DOSED IN MG ELEMENTAL CALCIUM ) 500 MG chewable tablet Chew 1 tablet (200 mg of elemental calcium  total) by mouth 3 (three) times daily with meals. 03/02/19  Yes Nida, Ethelle ORN, MD  clotrimazole  (CLOTRIMAZOLE  AF) 1 % cream Apply 1 application topically 2 (two) times daily. To affected area for 1-2 weeks 01/04/20  Yes [place], Theodoro FALCON, MD  cyclobenzaprine  (FLEXERIL ) 5 MG tablet Take 1 tablet (5 mg total) by mouth 3 (three) times daily as needed for muscle spasms. 10/02/23  Yes Melvenia Manus BRAVO, MD  dexlansoprazole  (DEXILANT ) 60 MG capsule Take 1 capsule  (60 mg total) by mouth daily. 06/04/23 06/03/24 Yes Carver, Carlin POUR, DO  gabapentin  (NEURONTIN ) 300 MG capsule Take 300 mg by mouth every 8 (eight) hours. 09/16/23  Yes [provider]  lidocaine  (LIDODERM ) 5 % Place 1 patch onto the skin daily. 05/19/23  Yes [provider]  magnesium  hydroxide (MILK OF MAGNESIA) 400 MG/5ML suspension Take 30 mLs by mouth daily as needed for mild constipation.   Yes [provider]  nystatin  (MYCOSTATIN ) 100000 UNIT/ML suspension TAKE 5 MLS BY MOUTH 4 TIMES DAILY AS NEEDED. 09/25/23  Yes Dixon, Phillip E, MD  SYMBICORT  80-4.5 MCG/ACT inhaler Inhale 2 puffs by mouth twice daily Patient taking differently: Inhale 2 puffs into the lungs daily. 05/06/23  Yes Melvenia Manus BRAVO, MD  triamcinolone  (KENALOG ) 0.1 % Apply 1 application topically 2 (two) times daily. 07/28/20  Yes Tobie Suzzane POUR, MD  VENTOLIN  HFA 108 (90 Base) MCG/ACT inhaler INHALE 2 PUFFS BY MOUTH EVERY 6 HOURS AS NEEDED FOR WHEEZING FOR SHORTNESS OF BREATH 07/08/23  Yes Melvenia Manus BRAVO, MD    Discontinued Meds:   Medications Discontinued During This Encounter  Medication Reason   gabapentin  (NEURONTIN ) 400 MG capsule  Change in therapy   fluticasone  (FLONASE ) 50 MCG/ACT nasal spray Patient Preference   lactase (LACTAID) 3000 units tablet Patient Preference   Lidocaine  HCl 4 % LIQD Patient Preference   Menthol  10 % AERO Patient Preference   methylPREDNISolone  (MEDROL  DOSEPAK) 4 MG TBPK tablet Patient Preference   BUPRENORPHINE HCL SL Patient Preference   sodium chloride  0.9 % bolus 1,000 mL    potassium chloride  10 mEq in 100 mL IVPB    potassium chloride  (KLOR-CON ) packet 60 mEq    potassium chloride  (KLOR-CON ) packet 40 mEq    potassium chloride  10 mEq in 100 mL IVPB    dextrose  5 % solution    potassium chloride  SA (KLOR-CON  M) CR tablet 40 mEq     Social History:  reports that she has been smoking cigarettes. She has a 7.5 pack-year smoking history. She has never used  smokeless tobacco. She reports that she does not currently use alcohol. She reports that she does not use drugs.  Family History:   Family History  Problem Relation Age of Onset   Hypothyroidism Mother    Heart disease Father    Parkinson's disease Father    Hypothyroidism Daughter    Heart attack Paternal Grandfather    Alzheimer's disease Paternal Grandmother    Heart attack Maternal Grandfather    Crohn's disease Sister    Other Sister        was murdered   Crohn's disease Maternal Uncle    Colon cancer Neg Hx    Colon polyps Neg Hx     Blood pressure 125/83, pulse (!) 49, temperature 97.7 F (36.5 C), temperature source Axillary, resp. rate 19, height 5' (1.524 m), weight 45.2 kg, SpO2 98%. Physical Exam: GEN: Female, lying in bed, answer questions appropriate ENT: NCAT, scalp hematoma noted CV: Regular, normal S1 and S2 PULM: Clear bilaterally but poor effort ABD: Soft, nontender SKIN: No rashes or lesions EXT: No edema NEURO: Nonfocal, awakens to physical stimuli      MELIA LYNWOOD ORN, MD 10/30/2023, 9:53 AM

## 2023-10-30 NOTE — Plan of Care (Signed)

## 2023-10-31 DIAGNOSIS — E871 Hypo-osmolality and hyponatremia: Secondary | ICD-10-CM | POA: Diagnosis not present

## 2023-10-31 LAB — RENAL FUNCTION PANEL
Albumin: 3.1 g/dL — ABNORMAL LOW (ref 3.5–5.0)
Anion gap: 13 (ref 5–15)
BUN: 13 mg/dL (ref 8–23)
CO2: 20 mmol/L — ABNORMAL LOW (ref 22–32)
Calcium: 8.7 mg/dL — ABNORMAL LOW (ref 8.9–10.3)
Chloride: 102 mmol/L (ref 98–111)
Creatinine, Ser: 0.49 mg/dL (ref 0.44–1.00)
GFR, Estimated: >60 mL/min (ref >60)
Glucose, Bld: 100 mg/dL — ABNORMAL HIGH (ref 70–99)
Phosphorus: 3.8 mg/dL (ref 2.5–4.6)
Potassium: 3.2 mmol/L — ABNORMAL LOW (ref 3.5–5.1)
Sodium: 135 mmol/L (ref 135–145)

## 2023-10-31 LAB — MAGNESIUM: Magnesium: 1.9 mg/dL (ref 1.7–2.4)

## 2023-10-31 MED ORDER — ADULT MULTIVITAMIN W/MINERALS CH: 1.0000 | ORAL_TABLET | Freq: Every day | ORAL | Status: AC

## 2023-10-31 MED ORDER — POTASSIUM CHLORIDE CRYS ER 20 MEQ PO TBCR
40.0000 meq | EXTENDED_RELEASE_TABLET | ORAL | Status: AC
  Administered 2023-10-31 (×2): 40 meq via ORAL
  Filled 2023-10-31 (×2): qty 2

## 2023-10-31 MED ORDER — FOLIC ACID 1 MG PO TABS: 1.0000 mg | ORAL_TABLET | Freq: Every day | ORAL | 5 refills | Status: DC

## 2023-10-31 MED ORDER — VITAMIN B-1 100 MG PO TABS: 100.0000 mg | ORAL_TABLET | Freq: Every day | ORAL | 5 refills | Status: AC

## 2023-10-31 MED ORDER — GABAPENTIN 300 MG PO CAPS
300.0000 mg | ORAL_CAPSULE | Freq: Three times a day (TID) | ORAL | 3 refills | Status: AC
Start: 2023-10-31 — End: ?

## 2023-10-31 MED ORDER — SYMBICORT 80-4.5 MCG/ACT IN AERO: 2.0000 | INHALATION_SPRAY | Freq: Two times a day (BID) | RESPIRATORY_TRACT | 3 refills | Status: DC

## 2023-10-31 MED ORDER — VENTOLIN HFA 108 (90 BASE) MCG/ACT IN AERS: 2.0000 | INHALATION_SPRAY | Freq: Four times a day (QID) | RESPIRATORY_TRACT | 3 refills | Status: DC | PRN

## 2023-10-31 MED ORDER — PHENOBARBITAL SODIUM 130 MG/ML IJ SOLN
32.5000 mg | Freq: Three times a day (TID) | INTRAMUSCULAR | Status: DC
  Administered 2023-10-31: 32.5 mg via INTRAVENOUS
  Filled 2023-10-31: qty 1

## 2023-10-31 MED ORDER — DEXLANSOPRAZOLE 60 MG PO CPDR: 60.0000 mg | DELAYED_RELEASE_CAPSULE | Freq: Every day | ORAL | 11 refills | Status: DC

## 2023-10-31 MED ORDER — NICOTINE 21 MG/24HR TD PT24: 21.0000 mg | MEDICATED_PATCH | TRANSDERMAL | 0 refills | Status: AC

## 2023-10-31 NOTE — Progress Notes (Signed)
 Patient ambulated around the unit with 2 wheel walker. Patient tolerated very well.

## 2023-10-31 NOTE — Discharge Summary (Signed)
 [name] [name], is a 68 y.o. female  DOB [birth year]  MRN [id number].  Admission date:  10/27/2023  Admitting Physician  Eric Nunnery, MD  Discharge Date:  10/31/2023   Primary MD  Tobie Suzzane POUR, MD  Recommendations for primary care physician for things to follow:  1)Very Low-salt diet advised---Less than 2 gm of Sodium per day advised----ok to use Mrs DASH salt substitute instead of Salt 2)Avoid ibuprofen/Advil/Aleve/Motrin/Goody Powders/Naproxen/BC powders/Meloxicam/Diclofenac/Indomethacin and other Nonsteroidal anti-inflammatory medications as these will make you more likely to bleed and can cause stomach ulcers, can also cause Kidney problems.  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   Crohn disease (HCC)    CTS (carpal tunnel syndrome)    Diarrhea MULITIFACTORIAL   IBS, LACTOSE INTOLERANCE, SBBO, BILE-SALT   Elevated liver enzymes 2009: BMI 24 ?Etoh ALT 94, AST 51,  NEG ANA, qIGs& ASMA   MAY 2012 AST 52 ALT 38   Esophageal stricture 2009   GERD (gastroesophageal reflux disease)    Hemorrhoid    Hyperlipemia    Hypertension    Inflammatory bowel disease 2006 CD   SURGICAL REMISSION   LBP (low back pain)    Mental disorder    Migraine    Osteoporosis    PONV (postoperative nausea and vomiting)    Shortness of breath    exertion and humidity    Shoulder pain    right   Sleep apnea    Stop Bang Cock score of 4    Past Surgical History:  Procedure Laterality Date   BACK SURGERY     BACK SURGERY  07/16/2018   BILATERAL SALPINGOOPHORECTOMY     BIOPSY  12/24/2011   Procedure: BIOPSY;  Surgeon: Margo LITTIE Haddock, MD;  Location: [facility];  Service: Endoscopy;;  Gastric Biopsies   BIOPSY N/A 06/30/2012   Procedure: BIOPSY;  Surgeon: Margo LITTIE Haddock, MD;  Location: [facility];  Service: Endoscopy;  Laterality: N/A;  Gastric and Esophageal Biopsies   BIOPSY  06/23/2023   Procedure: BIOPSY;  Surgeon: Cindie Carlin POUR, DO;  [facility];  Service: Endoscopy;;   CARPAL TUNNEL RELEASE  LEFT   CATARACT EXTRACTION W/PHACO Right 07/30/2016   Procedure: CATARACT EXTRACTION PHACO AND INTRAOCULAR LENS PLACEMENT (IOC);  Surgeon: Oneil Platts, MD;  Location: [facility];  Service: Ophthalmology;  Laterality: Right;  CDE: 5.45   CATARACT EXTRACTION W/PHACO Left 08/13/2016   Procedure: CATARACT EXTRACTION PHACO AND INTRAOCULAR LENS PLACEMENT (IOC);  Surgeon: Oneil Platts, MD;  Location: [facility];  Service: Ophthalmology;  Laterality: Left;  CDE: 5.59   COLON SURGERY     COLONOSCOPY  JAN 2009 DIARRHEA, ABD PAIN, BRBPR   ANASTOMOTIC ULCER(3MM), IH Ak:AZWPHW ULCER, NL COLON bX   COLONOSCOPY WITH PROPOFOL  N/A 04/21/2018   examined portion of the ileum normal, two 3-5 mm polyps, diverticulosis in the rectosigmoid and sigmoid colon, external and internal hemorrhoids, significant looping of the colon.  Pathology with tubular adenomas.  Recommendations to follow high-fiber diet and repeat colonoscopy in 5 to 10 years with peds colonoscope.   COLONOSCOPY WITH PROPOFOL  N/A 06/23/2023   Procedure: COLONOSCOPY WITH PROPOFOL ;  Surgeon: Cindie Carlin POUR, DO;  [facility];  Service: Endoscopy;  Laterality: N/A;  10:00 am, asa 3   DILATION AND CURETTAGE OF UTERUS     ESOPHAGEAL DILATION  12/24/2011   SLF: A stricture was found in the distal esophagus/ Moderate  gastritis/ MILD Duodenitis   ESOPHAGEAL DILATION N/A 06/23/2023   Procedure: ESOPHAGEAL DILATION;  Surgeon: Cindie Carlin POUR, DO;  [facility];  Service: Endoscopy;  Laterality: N/A;   ESOPHAGOGASTRODUODENOSCOPY  02/07/09   mild gastritis   ESOPHAGOGASTRODUODENOSCOPY N/A 09/11/2023   Procedure: EGD (ESOPHAGOGASTRODUODENOSCOPY);  Surgeon: Cindie Carlin POUR, DO;  [facility];  Service: Endoscopy;  Laterality: N/A;  8:15 AM, ASA 3   ESOPHAGOGASTRODUODENOSCOPY (EGD) WITH PROPOFOL  N/A 06/30/2012   SLF: 1. No definite stricture appreciated 2. small hiatal hernia 3. Gastritis   ESOPHAGOGASTRODUODENOSCOPY (EGD) WITH PROPOFOL  N/A 02/20/2016   Procedure: ESOPHAGOGASTRODUODENOSCOPY (EGD) WITH PROPOFOL ;  Surgeon: Margo LITTIE Haddock, MD;  [facility];  Service: Endoscopy;  Laterality: N/A;  930   ESOPHAGOGASTRODUODENOSCOPY (EGD) WITH PROPOFOL  N/A 11/24/2018   benign-appearing esophageal peptic stricture s/p dilation, small hiatal hernia, moderate erosive gastritis.    ESOPHAGOGASTRODUODENOSCOPY (EGD) WITH PROPOFOL  N/A 06/23/2023   Procedure: ESOPHAGOGASTRODUODENOSCOPY (EGD) WITH PROPOFOL ;  Surgeon: Cindie Carlin POUR, DO;  [facility];  Service: Endoscopy;  Laterality: N/A;   EYE SURGERY     gum flap Bilateral    HEMICOLECTOMY  RIGHT 2006   HERNIA REPAIR     LUMBAR DISC SURGERY     POLYPECTOMY  04/21/2018   Procedure: POLYPECTOMY;  Surgeon: Haddock Margo LITTIE, MD;  [facility];  Service: Endoscopy;;  (colon)   SAVORY DILATION N/A 06/30/2012   Procedure: SAVORY DILATION;  Surgeon: Margo LITTIE Haddock, MD;  Location: [facility];  Service: Endoscopy;  Laterality: N/A;  12.84mm , 14mm, 15mm   SAVORY DILATION N/A 02/20/2016   Procedure: SAVORY DILATION;  Surgeon: Margo LITTIE Haddock, MD;  [facility];  Service: Endoscopy;  Laterality: N/A;   SAVORY DILATION N/A 11/24/2018   Procedure: SAVORY DILATION;  Surgeon: Haddock Margo LITTIE, MD;  [facility];  Service:  Endoscopy;  Laterality: N/A;   UPPER GASTROINTESTINAL ENDOSCOPY  JAN 2009 ABD PAIN   GASTRITIS, ESO RING   UPPER GASTROINTESTINAL ENDOSCOPY  OCT 2010 ABD PAIN, DYSPHAGIA   DIL , GASTRITIS, NL DUODENUM   UPPER GASTROINTESTINAL ENDOSCOPY  OCT 2010 DYSPHAGIA   DIL 17 MM, GASTRITIS, NL DUODENUM   vocal cord surgery  Sep 20, 2010   precancerous areas removed   wisdom tooth extraction Right    pin placement due to broken jaw     HPI  from the history and physical done on the day of admission:   HPI: MIJA EFFERTZ is a 68 y.o. female with medical  history significant of GERD, history of asthma/COPD, GERD, alcohol abuse, hyperlipidemia and history of inflammatory bowel disease; who presented to the hospital secondary to fall associated with syncope.  Patient ended up striking the back of her head and was complaining of left-sided headache. Acute images demonstrating no acute intracranial abnormalities.   Further workup showing significant hyponatremia and a potassium of 2.7   Per the husband's report patient drinking over 15 beers per day and sometimes also having some wine.  While in the ED patient experience seizure activity that was aborted with the use of Ativan .   Case discussed with nephrology and critical care; patient received hypertonic saline and was started on phenobarbital . Electrolytes started to be repleted and despite being postictal patient hemodynamically stable and protecting airways.  TRH consulted to place in the hospital for further evaluation and management.   Review of Systems: unable to review all systems due to the inability of the patient to answer questions.  Hospital Course:   1-acute metabolic encephalopathy/seizure - No further seizure activity appreciated - Still with ongoing encephalopathy process most likely associated with alcohol withdrawal; but significantly improved.. - Currently requiring Precedex .  Has completed phenobarbital  detox as recommended by  critical care.. - Continue as needed Haldol  and Ativan  -Continue supportive care and follow clinical response. - Continue seizure precaution (no further seizure seen).   2-hyponatremia - Resolved for the most part; most likely asymptomatic from electrolyte disturbances at the moment. -Patient received 2 doses of DDAVP  - Sodium 128>>133 currently and has remained stable - Continue to follow electrolytes trend intermittently. - Appreciate assistance and recommendation by nephrology service.   3-GERD - Continue Pepcid  prophylactically. - As needed antiemetics has been ordered.   4-history of asthma/COPD - Continue Pulmicort  and as needed DuoNeb - No wheezing and normal respiratory effort appreciated on exam. - No using accessory muscle.   5-alcohol abuse - Continue phenobarbital  and Precedex  - Continue folic acid  and thiamine  - Cessation counseling provided. - TOC to provide outpatient resources to help patient Quitting.   6-hypokalemia - Continue electrolyte repletion and follow trend - Magnesium  within normal limits currently. - Continue telemetry monitoring. - K levels currently stable.   Discharge Condition: ***  Follow UP   Follow-up Information     Tobie Suzzane POUR, MD. Schedule an appointment as soon as possible for a visit in 1 week(s).   Specialty: Internal Medicine Contact information: [address] [place] [address] [phone]                 Diet and Activity recommendation:  As advised  Discharge Instructions    **** Discharge Instructions     Call MD for:  difficulty breathing, headache or visual disturbances   Complete by: As directed    Call MD for:  persistant dizziness or light-headedness   Complete by: As directed    Call MD for:  persistant nausea and vomiting   Complete by: As directed    Call MD for:  temperature >100.4   Complete by: As directed    Diet - low sodium heart healthy   Complete by: As directed     Discharge instructions   Complete by: As directed    1)Very Low-salt diet advised---Less than 2 gm of Sodium per day advised----ok to use Mrs DASH salt substitute instead of Salt 2)Avoid ibuprofen/Advil/Aleve/Motrin/Goody Powders/Naproxen/BC powders/Meloxicam/Diclofenac/Indomethacin and other Nonsteroidal anti-inflammatory medications as these will make you more likely to bleed and can cause stomach ulcers, can also cause Kidney  problems.  3)Complete Abstinence from Alcohol advised----consider outpatient or inpatient alcohol rehab program including AA meetings 4) smoking cessation advised--- May use nicotine  patch to help you quit smoking   Increase activity slowly   Complete by: As directed        Discharge Medications     Allergies as of 10/31/2023       Reactions   Naproxen Shortness Of Breath   Chest pain   Dicyclomine  Hcl Other (See Comments)   Vision changes   Duloxetine Other (See Comments)   Suicidal    Lovastatin Other (See Comments)   Chest pain   Toradol  [ketorolac  Tromethamine ] Other (See Comments)   Headache. Non-effective   Tramadol Other (See Comments)   Headache. Non-effective.   Nexium  [esomeprazole  Magnesium ] Diarrhea   Abdominal pain        Medication List     STOP taking these medications    amLODipine  10 MG tablet Commonly known as: NORVASC        TAKE these medications    benzonatate  100 MG capsule Commonly known as: TESSALON  Take 1 capsule by mouth twice daily   Buprenorphine HCl-Naloxone HCl 4-1 MG Film Place 1 Film under the tongue 4 (four) times daily.   calcium  carbonate 500 MG chewable tablet Commonly known as: TUMS - dosed in mg elemental calcium  Chew 1 tablet (200 mg of elemental calcium  total) by mouth 3 (three) times daily with meals.   clotrimazole  1 % cream Commonly known as: Clotrimazole  AF Apply 1 application topically 2 (two) times daily. To affected area for 1-2 weeks   cyclobenzaprine  5 MG tablet Commonly known as:  FLEXERIL  Take 1 tablet (5 mg total) by mouth 3 (three) times daily as needed for muscle spasms.   dexlansoprazole  60 MG capsule Commonly known as: DEXILANT  Take 1 capsule (60 mg total) by mouth daily.   folic acid  1 MG tablet Commonly known as: FOLVITE  Take 1 tablet (1 mg total) by mouth daily. Start taking on: November 01, 2023   gabapentin  300 MG capsule Commonly known as: NEURONTIN  Take 1 capsule (300 mg total) by mouth 3 (three) times daily. What changed: when to take this   lidocaine  5 % Commonly known as: LIDODERM  Place 1 patch onto the skin daily.   magnesium  hydroxide 400 MG/5ML suspension Commonly known as: MILK OF MAGNESIA Take 30 mLs by mouth daily as needed for mild constipation.   multivitamin with minerals Tabs tablet Take 1 tablet by mouth daily. Start taking on: November 01, 2023   nicotine  21 mg/24hr patch Commonly known as: NICODERM CQ  - dosed in mg/24 hours Place 1 patch (21 mg total) onto the skin daily for 28 days.   nystatin  100000 UNIT/ML suspension Commonly known as: MYCOSTATIN  TAKE 5 MLS BY MOUTH 4 TIMES DAILY AS NEEDED.   Symbicort  80-4.5 MCG/ACT inhaler Generic drug: budesonide -formoterol  Inhale 2 puffs into the lungs 2 (two) times daily. What changed: when to take this   thiamine  100 MG tablet Commonly known as: Vitamin B-1 Take 1 tablet (100 mg total) by mouth daily. Start taking on: November 01, 2023   triamcinolone  cream 0.1 % Commonly known as: KENALOG  Apply 1 application topically 2 (two) times daily.   Ventolin  HFA 108 (90 Base) MCG/ACT inhaler Generic drug: albuterol  Inhale 2 puffs into the lungs every 6 (six) hours as needed for wheezing or shortness of breath. What changed: See the new instructions.        Major procedures and Radiology Reports - PLEASE review detailed  and final reports for all details, in brief -  CT CERVICAL SPINE WO CONTRAST Result Date: 10/27/2023 EXAM: CT CERVICAL SPINE WITHOUT CONTRAST 10/27/2023 06:00:13 AM  TECHNIQUE: CT of the cervical was performed without the administration of intravenous contrast. Multiplanar reformatted images are provided for review. Automated exposure control, iterative reconstruction, and/or weight based adjustment of the mA/kV was utilized to reduce the radiation dose to as low as reasonably achievable. COMPARISON: None available. CLINICAL HISTORY: Neck trauma (Age >= 65y). Pt presents with fall after getting up from commode. Pt c/o neck pain and had knot to right side of the back of head. Pt is not on any blood thinners. FINDINGS: CERVICAL SPINE: BONES AND ALIGNMENT: No acute fracture or traumatic malalignment. DEGENERATIVE CHANGES: Asymmetric left-sided facet spurring leads to moderate left foraminal stenosis at C3-4. Right greater than left facet hypertrophy is present at C4-5 with moderate foraminal stenosis bilaterally. Chronic vertebral spurring leads to severe foraminal stenosis on the left at C5-6 and bilaterally at C6-7. SOFT TISSUES: No prevertebral soft tissue swelling. IMPRESSION: 1. No acute fracture or traumatic malalignment. 2. Degenerative changes as detailed above. Electronically signed by: Lonni Necessary MD 10/27/2023 06:12 AM EDT RP Workstation: [id number]   CT HEAD WO CONTRAST ( ) Result Date: 10/27/2023 EXAM: CT HEAD WITHOUT CONTRAST 10/27/2023 06:00:13 AM TECHNIQUE: CT of the head was performed without the administration of intravenous contrast. Automated exposure control, iterative reconstruction, and/or weight based adjustment of the mA/kV was utilized to reduce the radiation dose to as low as reasonably achievable. COMPARISON: None available. CLINICAL HISTORY: Head trauma, moderate-severe. Pt presents with fall after getting up from commode. Pt c/o neck pain and had knot to right side of the back of head. Pt is not on any blood thinners. FINDINGS: BRAIN AND VENTRICLES: No acute hemorrhage. Gray-white differentiation is preserved. No hydrocephalus. No  extra-axial collection. No mass effect or midline shift. Mild generalized atrophy is present. Periventricular and scattered subcortical white matter hypoattenuation is moderately advanced for age. ORBITS: No acute abnormality. SINUSES: No acute abnormality. SOFT TISSUES AND SKULL: Left parietal scalp soft tissue swelling and hematoma are present without underlying fracture or foreign body. No skull fracture. VASCULATURE: Atherosclerotic calcifications are present in the cavernous carotid arteries bilaterally and at the dural margin of both vertebral arteries. No hyperdense vessel is present. IMPRESSION: 1. No acute intracranial abnormality. 2. Left parietal scalp soft tissue swelling and hematoma without underlying fracture or foreign body. Electronically signed by: Lonni Necessary MD 10/27/2023 06:09 AM EDT RP Workstation: [id number]   DG Cervical Spine Complete Result Date: 10/11/2023 CLINICAL DATA:  Neck pain.  No known injury. EXAM: CERVICAL SPINE - COMPLETE 4+ VIEW COMPARISON:  None Available. FINDINGS: There is loss of cervical lordosis, which may be on the basis of positioning or due to muscle spasm, No spondylolisthesis. Vertebral body heights are maintained. No fracture or destructive lesion. There is mild asymmetric widening of right atlantoaxial joint space on odontoid view (odontoid lateral mass asymmetry), which is nonspecific and most likely due to technical factors or normal variants, in the absence of trauma. Mild-moderate multilevel degenerative changes in the form of reduced intervertebral disc height (mainly at C5-C6 and C6-C7 levels), facet arthropathy and marginal osteophyte formation. Prevertebral soft tissues within normal limits. IMPRESSION: 1. No acute osseous abnormality of the cervical spine. 2. Mild asymmetric widening of right atlantoaxial joint space on odontoid view (odontoid lateral mass asymmetry), which is nonspecific and most likely due to technical factors or normal  variants, in the absence  of trauma. 3. Mild-moderate multilevel degenerative changes. Electronically Signed   By: Ree Molt M.D.   On: 10/11/2023 13:18   Micro Results  Recent Results (from the past 240 hours)  MRSA Next Gen by PCR, Nasal     Status: None   Collection Time: 10/27/23 12:55 PM   Specimen: Nasal Mucosa; Nasal Swab  Result Value Ref Range Status   MRSA by PCR Next Gen NOT DETECTED NOT DETECTED Final    Comment: (NOTE) The GeneXpert MRSA Assay (FDA approved for NASAL specimens only), is one component of a comprehensive MRSA colonization surveillance program. It is not intended to diagnose MRSA infection nor to guide or monitor treatment for MRSA infections. Test performance is not FDA approved in patients less than 69 years old. Performed at [facility], [address], [place], [place] [address]    Today   Subjective    [name] [name] today has no new complaints - Ambulating around the unit with staff,  --no hypoxia or dyspnea on exertion or palpitations or dizziness with ambulation - Husband at bedside, questions answered No fever  Or chills   No Nausea, Vomiting or Diarrhea   Patient has been seen and examined prior to discharge   Objective   Blood pressure 113/66, pulse 92, temperature 98.2 F (36.8 C), temperature source Axillary, resp. rate (!) 23, height 5' (1.524 m), weight 43.2 kg, SpO2 97%.   Intake/Output Summary (Last 24 hours) at 10/31/2023 1137 Last data filed at 10/31/2023 0855 Gross per 24 hour  Intake 251.04 ml  Output --  Net 251.04 ml    Exam Gen:- Awake Alert, no acute distress  HEENT:- [place].AT, No sclera icterus Neck-Supple Neck,No JVD,.  Lungs-  CTAB , good air movement bilaterally CV- S1, S2 normal, regular Abd-  +ve B.Sounds, Abd Soft, No tenderness,    Extremity/Skin:- No  edema,   good pulses Psych-affect is appropriate, oriented x3 Neuro-no new focal deficits, no tremors    Data Review   CBC w Diff:  Lab Results   Component Value Date   WBC 6.8 10/27/2023   HGB 15.3 (H) 10/27/2023   HGB 14.7 02/13/2023   HCT 40.9 10/27/2023   HCT 42.1 02/13/2023   PLT 129 (L) 10/27/2023   PLT 199 02/13/2023   LYMPHOPCT 48 01/31/2020   MONOPCT 16 01/31/2020   EOSPCT 5 01/31/2020   BASOPCT 1 01/31/2020   CMP:  Lab Results  Component Value Date   NA 135 10/31/2023   NA 140 02/13/2023   K 3.2 (L) 10/31/2023   CL 102 10/31/2023   CO2 20 (L) 10/31/2023   BUN 13 10/31/2023   BUN 5 (L) 02/13/2023   CREATININE 0.49 10/31/2023   CREATININE 0.53 07/02/2019   PROT 7.0 02/13/2023   ALBUMIN 3.1 (L) 10/31/2023   ALBUMIN 4.5 02/13/2023   BILITOT 0.5 02/13/2023   ALKPHOS 135 (H) 02/13/2023   AST 56 (H) 02/13/2023   ALT 33 (H) 02/13/2023  . Total Discharge time is about 33 minutes  Rendall Carwin M.D on 10/31/2023 at 11:37 AM  Go to [email or web address] -  for contact info  [facility] - Office  [phone]

## 2023-10-31 NOTE — Care Management Important Message (Signed)
 Important Message  Patient Details  Name: [name] [name] MRN: [id number] Date of Birth: [birth year]   Important Message Given:        [name] [name] 10/31/2023, 12:32 PM

## 2023-10-31 NOTE — TOC Transition Note (Signed)
 Transition of Care [facility]) - Discharge Note   Patient Details  Name: [name] [name] MRN: [id number] Date of Birth: Sep 24, 1955  [facility]) CM/SW Contact:  Nena LITTIE Coffee, RN Phone Number: 10/31/2023, 1:41 PM   Clinical Narrative:    Pt will not be homebound so will not qualify for [facility]. Referral for outpatient PT signed and sent. Information added to AVS. Updated pt and Lemond Ro (significant other). Mr. Ro stated he will be able to transport pt to follow-up appts and therapy.   Referrals Ambulatory referral to Physical Therapy Complete by: As directed  [facility]  [phone]   [address] [place] [address]       Final next level of care: Home/Self Care Barriers to Discharge: Continued Medical Work up   Patient Goals and CMS Choice Patient states their goals for this hospitalization and ongoing recovery are:: Return home          Discharge Placement                       Discharge Plan and Services Additional resources added to the After Visit Summary for                                       Social Drivers of Health (SDOH) Interventions SDOH Screenings   Food Insecurity: No Food Insecurity (10/27/2023)  Housing: Low Risk  (10/27/2023)  Transportation Needs: No Transportation Needs (10/27/2023)  Utilities: Not At Risk (10/27/2023)  Alcohol Screen: Low Risk  (01/14/2022)  Depression (PHQ2-9): Low Risk  (10/02/2023)  Recent Concern: Depression (PHQ2-9) - Medium Risk (07/29/2023)  Financial Resource Strain: Low Risk  (01/14/2022)  Physical Activity: Insufficiently Active (01/14/2022)  Social Connections: Moderately Isolated (10/27/2023)  Stress: No Stress Concern Present (01/14/2022)  Tobacco Use: High Risk (10/27/2023)     Readmission Risk Interventions     No data to display

## 2023-10-31 NOTE — Discharge Instructions (Signed)
 1)Very Low-salt diet advised---Less than 2 gm of Sodium per day advised----ok to use Mrs DASH salt substitute instead of Salt 2)Avoid ibuprofen/Advil/Aleve/Motrin/Goody Powders/Naproxen/BC powders/Meloxicam/Diclofenac/Indomethacin and other Nonsteroidal anti-inflammatory medications as these will make you more likely to bleed and can cause stomach ulcers, can also cause Kidney problems.  3)Complete Abstinence from Alcohol advised----consider outpatient or inpatient alcohol rehab program including AA meetings 4) smoking cessation advised--- May use nicotine  patch to help you quit smoking

## 2023-11-03 ENCOUNTER — Other Ambulatory Visit: Payer: Self-pay | Admitting: Internal Medicine

## 2023-11-03 DIAGNOSIS — J410 Simple chronic bronchitis: Secondary | ICD-10-CM

## 2023-11-06 ENCOUNTER — Telehealth: Payer: Self-pay | Admitting: Internal Medicine

## 2023-11-06 NOTE — Telephone Encounter (Unsigned)
 Copied from CRM [id number]. Topic: Clinical - Request for Lab/Test Order >> Nov 06, 2023  3:25 PM Donee H wrote: Reason for CRM: Patient states she went to have a xray done with Juliane Budge and it was suggested   she needs an MRI of neck and head done. Patient is requesting to see if Dr. Tobie could place order asap so she can have test done. Patient requesting callback for follow up on request as she doesn't have access to her Mychart. Callback number  [phone]

## 2023-11-10 ENCOUNTER — Telehealth: Payer: Self-pay

## 2023-11-10 NOTE — Telephone Encounter (Signed)
 See other encounter.

## 2023-11-10 NOTE — Telephone Encounter (Signed)
 Copied from CRM [id number]. Topic: Clinical - Request for Lab/Test Order >> Nov 10, 2023 11:24 AM [name] [name] wrote: Reason for CRM: The patient has called to request orders for an MRI and shares that they need the imaging completed prior to their appointment on 11/12/23. Please contact the patient further to confirm placement if possible

## 2023-11-12 ENCOUNTER — Ambulatory Visit: Payer: Self-pay

## 2023-11-12 VITALS — BP 111/81 | HR 96 | Ht 60.0 in | Wt 98.0 lb

## 2023-11-12 DIAGNOSIS — E876 Hypokalemia: Secondary | ICD-10-CM | POA: Diagnosis not present

## 2023-11-12 DIAGNOSIS — J410 Simple chronic bronchitis: Secondary | ICD-10-CM | POA: Diagnosis not present

## 2023-11-12 DIAGNOSIS — M542 Cervicalgia: Secondary | ICD-10-CM | POA: Diagnosis not present

## 2023-11-12 DIAGNOSIS — E871 Hypo-osmolality and hyponatremia: Secondary | ICD-10-CM

## 2023-11-12 DIAGNOSIS — J302 Other seasonal allergic rhinitis: Secondary | ICD-10-CM

## 2023-11-12 DIAGNOSIS — S161XXA Strain of muscle, fascia and tendon at neck level, initial encounter: Secondary | ICD-10-CM

## 2023-11-12 MED ORDER — BENZONATATE 100 MG PO CAPS
100.0000 mg | ORAL_CAPSULE | Freq: Two times a day (BID) | ORAL | 2 refills | Status: DC | PRN
Start: 2023-11-12 — End: 2024-02-03

## 2023-11-12 MED ORDER — CYCLOBENZAPRINE HCL 5 MG PO TABS
5.0000 mg | ORAL_TABLET | Freq: Three times a day (TID) | ORAL | 2 refills | Status: DC | PRN
Start: 2023-11-12 — End: 2024-02-03

## 2023-11-12 NOTE — Progress Notes (Unsigned)
 Established Patient Office Visit  Subjective   Patient ID: [name] [name], female    DOB: [birth year]  Age: 68 y.o. MRN: [id number]  Chief Complaint  Patient presents with   Hospitalization Follow-up    Hospital follow up was discharged 6/27    HPI  Follow up Hospitalization  Patient was admitted to [facility] on 10/27/23 and discharged on 10/31/23. She was treated for hyponatremia. ----------------------------------------------------------------------------------------- - HPI: ADEA [name] is a 68 y.o. female with medical history significant of GERD, history of asthma/COPD, GERD, alcohol abuse, hyperlipidemia and history of inflammatory bowel disease; who presented to the hospital secondary to fall associated with syncope.  Patient ended up striking the back of her head and was complaining of left-sided headache. Acute images demonstrating no acute intracranial abnormalities.   Further workup showing significant hyponatremia and a potassium of 2.7   Per the husband's report patient drinking over 15 beers per day and sometimes also having some wine.  While in the ED patient experience seizure activity that was aborted with the use of Ativan .  Patient Active Problem List   Diagnosis Date Noted   Hyponatremia 10/27/2023   Cervical strain, acute, initial encounter 10/02/2023   Night sweats 08/15/2023   Ankle edema, bilateral 06/10/2023   Breast cancer screening by mammogram 02/13/2023   Encounter for well adult exam with abnormal findings 02/13/2023   Need for influenza vaccination 02/13/2023   Injury to thumb (nail), right, initial encounter 05/05/2022   Encounter for general adult medical examination with abnormal findings 12/27/2021   Rash 08/14/2021   Hypothyroidism 12/13/2020   S/P lumbar fusion 10/19/2019   Seasonal allergies 09/01/2018   Chronic low back pain 04/07/2018   Perianal cyst 02/24/2018   Alcohol abuse 12/23/2017   Tobacco abuse 06/25/2017   Osteoarthritis  12/27/2014   Allergic dermatitis eyelid 12/27/2014   Abnormal CT scan, chest 01/12/2014   Polycythemia 09/08/2012   Vertigo 01/22/2012   Neck pain 01/22/2012   Chronic pain syndrome 12/27/2011   Diarrhea, episodic 12/27/2011   Edema of larynx 04/22/2011   Dysphagia 03/14/2011   DEPRESSION 04/24/2010   Chronic bronchitis (HCC) 01/19/2010   Osteoporosis 11/11/2008   Spondylolisthesis, lumbar region 07/28/2008   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/2007   DEGENERATIVE DISC DISEASE, CERVICAL SPINE 06/29/2007   Hyperlipidemia 02/03/2007   VARCIES, SITE NEC 09/30/2006   Anxiety state 02/27/2006   Essential hypertension 02/27/2006   Allergic rhinitis 02/27/2006   GERD 02/27/2006   Crohn's disease of small intestine with complication (HCC) 02/27/2006      ROS    Objective:     BP 111/81   Pulse 96   Ht 5' (1.524 m)   Wt 98 lb (44.5 kg)   SpO2 95%   BMI 19.14 kg/m  BP Readings from Last 3 Encounters:  11/12/23 111/81  10/31/23 139/83  10/02/23 125/79   Wt Readings from Last 3 Encounters:  11/12/23 98 lb (44.5 kg)  10/31/23 95 lb 3.8 oz (43.2 kg)  10/02/23 102 lb 6.4 oz (46.4 kg)      Physical Exam   No results found for any visits on 11/12/23.  Last CBC Lab Results  Component Value Date   WBC 6.8 10/27/2023   HGB 15.3 (H) 10/27/2023   HCT 40.9 10/27/2023   MCV 95.1 10/27/2023   MCH 35.6 (H) 10/27/2023   RDW 12.6 10/27/2023   PLT 129 (L) 10/27/2023   Last metabolic panel Lab Results  Component Value Date   GLUCOSE  100 (H) 10/31/2023   NA 135 10/31/2023   K 3.2 (L) 10/31/2023   CL 102 10/31/2023   CO2 20 (L) 10/31/2023   BUN 13 10/31/2023   CREATININE 0.49 10/31/2023   GFRNONAA >60 10/31/2023   CALCIUM  8.7 (L) 10/31/2023   PHOS 3.8 10/31/2023   PROT 7.0 02/13/2023   ALBUMIN 3.1 (L) 10/31/2023   LABGLOB 2.5 02/13/2023   AGRATIO 2.0 07/22/2022   BILITOT 0.5 02/13/2023   ALKPHOS 135 (H) 02/13/2023   AST 56 (H) 02/13/2023   ALT 33 (H)  02/13/2023   ANIONGAP 13 10/31/2023   Last lipids Lab Results  Component Value Date   CHOL 238 (H) 09/08/2023   HDL 142 09/08/2023   LDLCALC 85 09/08/2023   LDLDIRECT 29 03/11/2011   TRIG 63 09/08/2023   CHOLHDL 1.7 09/08/2023      The ASCVD Risk score (Arnett DK, et al., 2019) failed to calculate for the following reasons:   The valid HDL cholesterol range is 20 to 100 mg/dL    Assessment & Plan:   Problem List Items Addressed This Visit   None   No follow-ups on file.    [name] Longs, FNP

## 2023-11-13 DIAGNOSIS — E876 Hypokalemia: Secondary | ICD-10-CM | POA: Insufficient documentation

## 2023-11-13 NOTE — Assessment & Plan Note (Signed)
 Recheck levels as advised recent hospitalization.

## 2023-11-13 NOTE — Assessment & Plan Note (Signed)
 MRI ordered for further evaluation of ongoing neck pain.  Flexeril  renewed for as needed use.

## 2023-11-13 NOTE — Assessment & Plan Note (Signed)
 Asymptomatic currently.  Pulmonary exam is unremarkable.  She is using Symbicort  daily and albuterol  as needed, which she typically needs 1 time per week. Tessalon  perles refilled for prn use.

## 2023-11-17 DIAGNOSIS — Z79891 Long term (current) use of opiate analgesic: Secondary | ICD-10-CM | POA: Diagnosis not present

## 2023-11-17 DIAGNOSIS — M4326 Fusion of spine, lumbar region: Secondary | ICD-10-CM | POA: Diagnosis not present

## 2023-11-17 DIAGNOSIS — M5442 Lumbago with sciatica, left side: Secondary | ICD-10-CM | POA: Diagnosis not present

## 2023-11-17 DIAGNOSIS — G894 Chronic pain syndrome: Secondary | ICD-10-CM | POA: Diagnosis not present

## 2023-11-17 DIAGNOSIS — M549 Dorsalgia, unspecified: Secondary | ICD-10-CM | POA: Diagnosis not present

## 2023-11-17 DIAGNOSIS — Z79899 Other long term (current) drug therapy: Secondary | ICD-10-CM | POA: Diagnosis not present

## 2023-11-18 ENCOUNTER — Ambulatory Visit (HOSPITAL_COMMUNITY)
Admission: RE | Admit: 2023-11-18 | Discharge: 2023-11-18 | Disposition: A | Discharge status: Home / Self Care | Source: Ambulatory Visit

## 2023-11-18 DIAGNOSIS — M542 Cervicalgia: Secondary | ICD-10-CM | POA: Insufficient documentation

## 2023-11-18 DIAGNOSIS — M50222 Other cervical disc displacement at C5-C6 level: Secondary | ICD-10-CM | POA: Diagnosis not present

## 2023-11-18 DIAGNOSIS — E876 Hypokalemia: Secondary | ICD-10-CM | POA: Diagnosis not present

## 2023-11-18 DIAGNOSIS — E871 Hypo-osmolality and hyponatremia: Secondary | ICD-10-CM | POA: Diagnosis not present

## 2023-11-18 DIAGNOSIS — M4802 Spinal stenosis, cervical region: Secondary | ICD-10-CM | POA: Diagnosis not present

## 2023-11-18 DIAGNOSIS — R609 Edema, unspecified: Secondary | ICD-10-CM | POA: Diagnosis not present

## 2023-11-18 DIAGNOSIS — M50223 Other cervical disc displacement at C6-C7 level: Secondary | ICD-10-CM | POA: Diagnosis not present

## 2023-11-19 LAB — BASIC METABOLIC PANEL WITH GFR
BUN/Creatinine Ratio: 13 (ref 12–28)
BUN: 8 mg/dL (ref 8–27)
CO2: 18 mmol/L — ABNORMAL LOW (ref 20–29)
Calcium: 8.9 mg/dL (ref 8.7–10.3)
Chloride: 101 mmol/L (ref 96–106)
Creatinine, Ser: 0.6 mg/dL (ref 0.57–1.00)
Glucose: 73 mg/dL (ref 70–99)
Potassium: 4.5 mmol/L (ref 3.5–5.2)
Sodium: 137 mmol/L (ref 134–144)
eGFR: 99 mL/min/1.73 (ref >59)

## 2023-11-20 ENCOUNTER — Ambulatory Visit: Payer: Self-pay

## 2023-11-20 NOTE — Telephone Encounter (Signed)
 FYI Only or Action Required?: FYI only for provider.  Patient was last seen in primary care on 11/12/2023 by Bevely Doffing, FNP.  Called Nurse Triage reporting [place].  Symptoms began several weeks ago.  Interventions attempted: Prescription medications: gabapentin  and muscle relaxer.  Symptoms are: gradually worsening.  Triage Disposition: See PCP When Office is Open (Within 3 Days)  Patient/caregiver understands and will follow disposition?: Yes   Copied from CRM [id number]. Topic: Clinical - Red Word Triage >> Nov 20, 2023  2:47 PM Delon DASEN wrote: Red Word that prompted transfer to Nurse Triage: pain in neck and back, had a fall a couple weeks ago, lump on collar bone right getting bigger - pain level 8 Reason for Disposition  [1] Small swelling or lump AND [2] unexplained AND [3] present > 1 week  Answer Assessment - Initial Assessment Questions 1. APPEARANCE of SWELLING: What does it look like?     Lump  2. SIZE: How large is the swelling? (e.g., inches, cm; or compare to size of pinhead, tip of pen, eraser, coin, pea, grape, ping pong ball)      quarter 3. LOCATION: Where is the swelling located?     Left collar bone and neck area 4. ONSET: When did the swelling start?     A couple week 5. COLOR: What color is it? Is there more than one color?     Skin color 6. PAIN: Is there any pain? If Yes, ask: How bad is the pain? (Scale 1-10; or mild, moderate, severe)       Comes and goes, mod 7. ITCH: Does it itch? If Yes, ask: How bad is the itch?      no 8. CAUSE: What do you think caused the swelling?    Ear popping  Protocols used: Skin Lump or Localized Swelling-A-AH

## 2023-11-20 NOTE — Telephone Encounter (Signed)
 Instructed pt that if it gets bigger or starts to restrict breathing to go to the ED.

## 2023-11-21 ENCOUNTER — Other Ambulatory Visit: Payer: Self-pay

## 2023-11-21 DIAGNOSIS — M898X1 Other specified disorders of bone, shoulder: Secondary | ICD-10-CM

## 2023-11-21 NOTE — Telephone Encounter (Signed)
 Left side, states its going into her neck and shoulder blade is in pain states goes up her neck into her shoulders blades and arms and her collarbone and states its shooting pain

## 2023-11-24 ENCOUNTER — Ambulatory Visit

## 2023-11-24 ENCOUNTER — Ambulatory Visit (HOSPITAL_COMMUNITY)
Admission: RE | Admit: 2023-11-24 | Discharge: 2023-11-24 | Disposition: A | Discharge status: Home / Self Care | Source: Ambulatory Visit

## 2023-11-24 VITALS — BP 131/87 | HR 96 | Ht 60.0 in | Wt 97.0 lb

## 2023-11-24 DIAGNOSIS — M898X1 Other specified disorders of bone, shoulder: Secondary | ICD-10-CM

## 2023-11-24 DIAGNOSIS — M25512 Pain in left shoulder: Secondary | ICD-10-CM | POA: Diagnosis not present

## 2023-11-24 DIAGNOSIS — M70812 Other soft tissue disorders related to use, overuse and pressure, left shoulder: Secondary | ICD-10-CM | POA: Diagnosis not present

## 2023-11-24 NOTE — Progress Notes (Signed)
 Established Patient Office Visit  Subjective   Patient ID: [name] [name], female    DOB: 06/22/1955  Age: 68 y.o. MRN: [id number]  Chief Complaint  Patient presents with   Medical Management of Chronic Issues    Pt states lump on neck   HPI  Patient Active Problem List   Diagnosis Date Noted   Hypokalemia 11/13/2023   Hyponatremia 10/27/2023   Cervical strain, acute, initial encounter 10/02/2023   Night sweats 08/15/2023   Ankle edema, bilateral 06/10/2023   Breast cancer screening by mammogram 02/13/2023   Encounter for well adult exam with abnormal findings 02/13/2023   Need for influenza vaccination 02/13/2023   Injury to thumb (nail), right, initial encounter 05/05/2022   Encounter for general adult medical examination with abnormal findings 12/27/2021   Rash 08/14/2021   Hypothyroidism 12/13/2020   S/P lumbar fusion 10/19/2019   Seasonal allergies 09/01/2018   Chronic low back pain 04/07/2018   Perianal cyst 02/24/2018   Alcohol abuse 12/23/2017   Tobacco abuse 06/25/2017   Osteoarthritis 12/27/2014   Allergic dermatitis eyelid 12/27/2014   Abnormal CT scan, chest 01/12/2014   Polycythemia 09/08/2012   Vertigo 01/22/2012   Cervicalgia 01/22/2012   Chronic pain syndrome 12/27/2011   Diarrhea, episodic 12/27/2011   Edema of larynx 04/22/2011   Dysphagia 03/14/2011   DEPRESSION 04/24/2010   Chronic bronchitis (HCC) 01/19/2010   Osteoporosis 11/11/2008   Spondylolisthesis, lumbar region 07/28/2008   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/2007   DEGENERATIVE DISC DISEASE, CERVICAL SPINE 06/29/2007   Hyperlipidemia 02/03/2007   VARCIES, SITE NEC 09/30/2006   Anxiety state 02/27/2006   Essential hypertension 02/27/2006   Allergic rhinitis 02/27/2006   GERD 02/27/2006   Crohn's disease of small intestine with complication (HCC) 02/27/2006      ROS    Objective:     BP 131/87   Pulse 96   Ht 5' (1.524 m)   Wt 97 lb (44 kg)   SpO2 94%   BMI  18.94 kg/m  BP Readings from Last 3 Encounters:  11/24/23 131/87  11/12/23 111/81  10/31/23 139/83   Wt Readings from Last 3 Encounters:  11/24/23 97 lb (44 kg)  11/12/23 98 lb (44.5 kg)  10/31/23 95 lb 3.8 oz (43.2 kg)      Physical Exam Vitals and nursing note reviewed.  Constitutional:      Appearance: Normal appearance.  HENT:     Head: Normocephalic.  Eyes:     Extraocular Movements: Extraocular movements intact.     Pupils: Pupils are equal, round, and reactive to light.  Cardiovascular:     Rate and Rhythm: Normal rate and regular rhythm.  Pulmonary:     Effort: Pulmonary effort is normal.     Breath sounds: Normal breath sounds.  Chest:     Chest wall: Deformity and tenderness present.    Musculoskeletal:     Cervical back: Normal range of motion and neck supple.  Neurological:     Mental Status: She is alert and oriented to person, place, and time.  Psychiatric:        Mood and Affect: Mood normal.        Thought Content: Thought content normal.     No results found for any visits on 11/24/23.    The ASCVD Risk score (Arnett DK, et al., 2019) failed to calculate for the following reasons:   The valid HDL cholesterol range is 20 to 100 mg/dL    Assessment & Plan:  Problem List Items Addressed This Visit   None Visit Diagnoses       Clavicle pain    -  Primary   will obtain x-ray for further evaluation.       No follow-ups on file.    Leita Longs, FNP

## 2023-11-30 ENCOUNTER — Ambulatory Visit: Payer: Self-pay

## 2023-12-03 ENCOUNTER — Ambulatory Visit (INDEPENDENT_AMBULATORY_CARE_PROVIDER_SITE_OTHER): Admitting: Gastroenterology

## 2023-12-03 VITALS — BP 130/89 | HR 92 | Temp 98.6°F | Ht 60.0 in | Wt 94.4 lb

## 2023-12-03 DIAGNOSIS — K5903 Drug induced constipation: Secondary | ICD-10-CM | POA: Diagnosis not present

## 2023-12-03 DIAGNOSIS — F1091 Alcohol use, unspecified, in remission: Secondary | ICD-10-CM | POA: Diagnosis not present

## 2023-12-03 DIAGNOSIS — R7989 Other specified abnormal findings of blood chemistry: Secondary | ICD-10-CM | POA: Diagnosis not present

## 2023-12-03 DIAGNOSIS — K5900 Constipation, unspecified: Secondary | ICD-10-CM | POA: Insufficient documentation

## 2023-12-03 DIAGNOSIS — K760 Fatty (change of) liver, not elsewhere classified: Secondary | ICD-10-CM

## 2023-12-03 DIAGNOSIS — T402X5A Adverse effect of other opioids, initial encounter: Secondary | ICD-10-CM | POA: Diagnosis not present

## 2023-12-03 MED ORDER — LINACLOTIDE 290 MCG PO CAPS
290.0000 ug | ORAL_CAPSULE | Freq: Every day | ORAL | 3 refills | Status: DC
Start: 2023-12-03 — End: 2024-03-12

## 2023-12-03 NOTE — Patient Instructions (Signed)
 Today, I want you to do a bowel purge. Take 1 capful of Miralax  in 4-8 ounces of water  EVERY hour for 6 doses (example: 1pm, 2pm, 3pm, 4pm, 5pm, 6pm).  Then tomorrow, start taking Linzess . Linzess  works best when taken once a day every day, on an empty stomach, at least 30 minutes before your first meal of the day.  When Linzess  is taken daily as directed:  *Constipation relief is typically felt in about a week *IBS-C patients may begin to experience relief from belly pain and overall abdominal symptoms (pain, discomfort, and bloating) in about 1 week,   with symptoms typically improving over 12 weeks.  Diarrhea may occur in the first 2 weeks -keep taking it.  The diarrhea should go away and you should start having normal, complete, full bowel movements. It may be helpful to start treatment when you can be near the comfort of your own bathroom, such as a weekend.   I sent this to your pharmacy!  Please have blood work done the week of August 18th before your next appointment!  I enjoyed seeing you again today! I value our relationship and want to provide genuine, compassionate, and quality care. You may receive a survey regarding your visit with me, and I welcome your feedback! Thanks so much for taking the time to complete this. I look forward to seeing you again.      Therisa MICAEL Stager, PhD, ANP-BC [facility]

## 2023-12-03 NOTE — Progress Notes (Signed)
 Gastroenterology Office Note     Primary Care Physician:  Bevely Doffing, FNP  Primary Gastroenterologist: Dr. Cindie   Chief Complaint   Chief Complaint  Patient presents with   Follow-up    Pt complains of bowel habits being in balls.     History of Present Illness   [name] [name] is a 68 y.o. female presenting today with a history of Crohn's disease s/p ileocecectomy 2006 with surgical remission and no maintenance medications, hepatic steatosis with elastography noting low risk of fibrosis and known stable small gallbladder polyp, elevated LFTs, chronic GERD, dysphagia, PUD with recent EGD in interim (May 2025) noting healing, elevated transaminases, last seen April 2025 with recommendations for further serologies for elevated transaminases, but this has not been completed yet.   Returns today with constipation since her hospitalization in June 2025 for metabolic encephalopathy likely due to alcohol withdrawal, hyponatremia. She has brought stool with her today. Has balls of stool. Has to stick her finger in her vagina to help push the stool out. Chronic pain medication. Pt started taking a stool softener over the past week. BM usually once per day but lower abdominal pain and not emptying. On buprenorphine for past year increased from TID to QID late last year. Previously was on percocet.   Prior to hospitalization was drinking a little bit of beer every day. The hospital notes say over 15 peer per day. She has not had alcohol since that time. No confusion or mental status changes. Denies jaundice or pruritus. No overt GI bleeding.     May 2025 EGD for PUD surveillance: small hiatal hernia, mild schatzki ring, heale esophagitis, healed prior area of PUD, gastritis s/p biopsy. Path: negative H.pylori.   Feb 2025: EGD with findings of small hiatal hernia, Schatzki ring s/p dilation, LA Grade A reflux esophagitis, gastritis (negative H.pylori) and non-bleeding gastric ulcer    Colonoscopy Feb 2025: non-bleeding internal hemorrhoids, normal colon, patent anastomosis, 5 year surveillance due to history of adenomas.   Colonoscopy 04/21/2018 external/internal hemorrhoids, sigmoid diverticulosis, 2 polyps removed (tubular adenomas on pathology)    EGD 11/24/2018 with distal esophageal stenosis status post dilation up to 17 mm. Small hiatal hernia. Gastritis. Normal duodenum.     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   Crohn disease (HCC)    CTS (carpal tunnel syndrome)    Diarrhea MULITIFACTORIAL   IBS, LACTOSE INTOLERANCE, SBBO, BILE-SALT   Elevated liver enzymes 2009: BMI 24 ?Etoh ALT 94, AST 51,  NEG ANA, qIGs& ASMA   MAY 2012 AST 52 ALT 38   Esophageal stricture 2009   GERD (gastroesophageal reflux disease)    Hemorrhoid    Hyperlipemia    Hypertension    Inflammatory bowel disease 2006 CD   SURGICAL REMISSION   LBP (low back pain)    Mental disorder    Migraine    Osteoporosis    PONV (postoperative nausea and vomiting)    Shortness of breath    exertion and humidity   Shoulder pain    right   Sleep apnea    Stop Bang Cock score of 4    Past Surgical History:  Procedure Laterality Date   BACK SURGERY  BACK SURGERY  07/16/2018   BILATERAL SALPINGOOPHORECTOMY     BIOPSY  12/24/2011   Procedure: BIOPSY;  Surgeon: Margo LITTIE Haddock, MD;  Location: [facility];  Service: Endoscopy;;  Gastric Biopsies   BIOPSY N/A 06/30/2012   Procedure: BIOPSY;  Surgeon: Margo LITTIE Haddock, MD;  Location: [facility];  Service: Endoscopy;  Laterality: N/A;  Gastric and Esophageal Biopsies   BIOPSY  06/23/2023   Procedure: BIOPSY;  Surgeon: Cindie Carlin POUR, DO;  [facility]: [facility];  Service: Endoscopy;;   CARPAL TUNNEL RELEASE  LEFT   CATARACT EXTRACTION W/PHACO Right 07/30/2016    Procedure: CATARACT EXTRACTION PHACO AND INTRAOCULAR LENS PLACEMENT (IOC);  Surgeon: Oneil Platts, MD;  Location: [facility];  Service: Ophthalmology;  Laterality: Right;  CDE: 5.45   CATARACT EXTRACTION W/PHACO Left 08/13/2016   Procedure: CATARACT EXTRACTION PHACO AND INTRAOCULAR LENS PLACEMENT (IOC);  Surgeon: Oneil Platts, MD;  Location: [facility];  Service: Ophthalmology;  Laterality: Left;  CDE: 5.59   COLON SURGERY     COLONOSCOPY  JAN 2009 DIARRHEA, ABD PAIN, BRBPR   ANASTOMOTIC ULCER(3MM), IH Ak:AZWPHW ULCER, NL COLON bX   COLONOSCOPY WITH PROPOFOL  N/A 04/21/2018   examined portion of the ileum normal, two 3-5 mm polyps, diverticulosis in the rectosigmoid and sigmoid colon, external and internal hemorrhoids, significant looping of the colon.  Pathology with tubular adenomas.  Recommendations to follow high-fiber diet and repeat colonoscopy in 5 to 10 years with peds colonoscope.   COLONOSCOPY WITH PROPOFOL  N/A 06/23/2023   Procedure: COLONOSCOPY WITH PROPOFOL ;  Surgeon: Cindie Carlin POUR, DO;  [facility]: [facility];  Service: Endoscopy;  Laterality: N/A;  10:00 am, asa 3   DILATION AND CURETTAGE OF UTERUS     ESOPHAGEAL DILATION  12/24/2011   SLF: A stricture was found in the distal esophagus/ Moderate gastritis/ MILD Duodenitis   ESOPHAGEAL DILATION N/A 06/23/2023   Procedure: ESOPHAGEAL DILATION;  Surgeon: Cindie Carlin POUR, DO;  [facility]: [facility];  Service: Endoscopy;  Laterality: N/A;   ESOPHAGOGASTRODUODENOSCOPY  02/07/09   mild gastritis   ESOPHAGOGASTRODUODENOSCOPY N/A 09/11/2023   Procedure: EGD (ESOPHAGOGASTRODUODENOSCOPY);  Surgeon: Cindie Carlin POUR, DO;  [facility]: [facility];  Service: Endoscopy;  Laterality: N/A;  8:15 AM, ASA 3   ESOPHAGOGASTRODUODENOSCOPY (EGD) WITH PROPOFOL  N/A 06/30/2012   SLF: 1. No definite stricture appreciated 2. small hiatal hernia 3. Gastritis   ESOPHAGOGASTRODUODENOSCOPY (EGD) WITH PROPOFOL  N/A 02/20/2016   Procedure: ESOPHAGOGASTRODUODENOSCOPY  (EGD) WITH PROPOFOL ;  Surgeon: Margo LITTIE Haddock, MD;  [facility]: [facility];  Service: Endoscopy;  Laterality: N/A;  930   ESOPHAGOGASTRODUODENOSCOPY (EGD) WITH PROPOFOL  N/A 11/24/2018   benign-appearing esophageal peptic stricture s/p dilation, small hiatal hernia, moderate erosive gastritis.    ESOPHAGOGASTRODUODENOSCOPY (EGD) WITH PROPOFOL  N/A 06/23/2023   Procedure: ESOPHAGOGASTRODUODENOSCOPY (EGD) WITH PROPOFOL ;  Surgeon: Cindie Carlin POUR, DO;  [facility];  Service: Endoscopy;  Laterality: N/A;   EYE SURGERY     gum flap Bilateral    HEMICOLECTOMY  RIGHT 2006   HERNIA REPAIR     LUMBAR DISC SURGERY     POLYPECTOMY  04/21/2018   Procedure: POLYPECTOMY;  Surgeon: Haddock Margo LITTIE, MD;  Location: [facility];  Service: Endoscopy;;  (colon)   SAVORY DILATION N/A 06/30/2012   Procedure: SAVORY DILATION;  Surgeon: Margo LITTIE Haddock, MD;  Location: [facility];  Service: Endoscopy;  Laterality: N/A;  12.3mm , 14mm, 15mm   SAVORY DILATION N/A 02/20/2016   Procedure: SAVORY DILATION;  Surgeon: Margo LITTIE  Fields, MD;  Location: [facility];  Service: Endoscopy;  Laterality: N/A;   SAVORY DILATION N/A 11/24/2018   Procedure: SAVORY DILATION;  Surgeon: Harvey Margo CROME, MD;  [facility]: [facility];  Service: Endoscopy;  Laterality: N/A;   UPPER GASTROINTESTINAL ENDOSCOPY  JAN 2009 ABD PAIN   GASTRITIS, ESO RING   UPPER GASTROINTESTINAL ENDOSCOPY  OCT 2010 ABD PAIN, DYSPHAGIA   DIL , GASTRITIS, NL DUODENUM   UPPER GASTROINTESTINAL ENDOSCOPY  OCT 2010 DYSPHAGIA   DIL 17 MM, GASTRITIS, NL DUODENUM   vocal cord surgery  Sep 20, 2010   precancerous areas removed   wisdom tooth extraction Right    pin placement due to broken jaw    Current Outpatient Medications  Medication Sig Dispense Refill   benzonatate  (TESSALON ) 100 MG capsule Take 1 capsule (100 mg total) by mouth 2 (two) times daily as needed for cough. 30 capsule 2   Buprenorphine HCl-Naloxone HCl 4-1 MG FILM Place 1 Film under the  tongue 4 (four) times daily.     calcium  carbonate (TUMS - DOSED IN MG ELEMENTAL CALCIUM ) 500 MG chewable tablet Chew 1 tablet (200 mg of elemental calcium  total) by mouth 3 (three) times daily with meals. 90 tablet 6   clotrimazole  (CLOTRIMAZOLE  AF) 1 % cream Apply 1 application topically 2 (two) times daily. To affected area for 1-2 weeks 30 g 0   cyclobenzaprine  (FLEXERIL ) 5 MG tablet Take 1 tablet (5 mg total) by mouth 3 (three) times daily as needed for muscle spasms. 60 tablet 2   dexlansoprazole  (DEXILANT ) 60 MG capsule Take 1 capsule (60 mg total) by mouth daily. 30 capsule 11   folic acid  (FOLVITE ) 1 MG tablet Take 1 tablet (1 mg total) by mouth daily. 30 tablet 5   gabapentin  (NEURONTIN ) 300 MG capsule Take 1 capsule (300 mg total) by mouth 3 (three) times daily. 90 capsule 3   lidocaine  (LIDODERM ) 5 % Place 1 patch onto the skin daily.     linaclotide  (LINZESS ) 290 MCG CAPS capsule Take 1 capsule (290 mcg total) by mouth daily before breakfast. 90 capsule 3   magnesium  hydroxide (MILK OF MAGNESIA) 400 MG/5ML suspension Take 30 mLs by mouth daily as needed for mild constipation.     Multiple Vitamin (MULTIVITAMIN WITH MINERALS) TABS tablet Take 1 tablet by mouth daily.     nystatin  (MYCOSTATIN ) 100000 UNIT/ML suspension TAKE 5 MLS BY MOUTH 4 TIMES DAILY AS NEEDED. 500 mL 0   thiamine  (VITAMIN B-1) 100 MG tablet Take 1 tablet (100 mg total) by mouth daily. 30 tablet 5   triamcinolone  (KENALOG ) 0.1 % Apply 1 application topically 2 (two) times daily. 30 g 0   SYMBICORT  80-4.5 MCG/ACT inhaler Inhale 2 puffs by mouth twice daily 33 g 0   VENTOLIN  HFA 108 (90 Base) MCG/ACT inhaler INHALE 2 PUFFS BY MOUTH EVERY 6 HOURS AS NEEDED FOR WHEEZING OR SHORTNESS OF BREATH 18 g 0   No current facility-administered medications for this visit.    Allergies as of 12/03/2023 - Review Complete 12/03/2023  Allergen Reaction Noted   Naproxen Shortness Of Breath 01/09/2016   Dicyclomine  hcl Other (See  Comments) 01/24/2021   Duloxetine Other (See Comments) 01/24/2021   Lovastatin Other (See Comments) 09/25/2010   Toradol  [ketorolac  tromethamine ] Other (See Comments) 02/20/2016   Tramadol Other (See Comments) 02/20/2016   Nexium  [esomeprazole  magnesium ] Diarrhea 06/12/2012    Family History  Problem Relation Age of Onset   Hypothyroidism Mother    Heart  disease Father    Parkinson's disease Father    Hypothyroidism Daughter    Heart attack Paternal Grandfather    Alzheimer's disease Paternal Grandmother    Heart attack Maternal Grandfather    Crohn's disease Sister    Other Sister        was murdered   Crohn's disease Maternal Uncle    Colon cancer Neg Hx    Colon polyps Neg Hx     Social History   Socioeconomic History   Marital status: Married    Spouse name: Lemond   Number of children: 2   Years of education: GED   Highest education level: Not on file  Occupational History    Comment: disabled  Tobacco Use   Smoking status: Every Day    Current packs/day: 0.25    Average packs/day: 0.3 packs/day for 30.0 years (7.5 ttl pk-yrs)    Types: Cigarettes   Smokeless tobacco: Never  Vaping Use   Vaping status: Never Used  Substance and Sexual Activity   Alcohol use: Not Currently    Comment: wine/beer a few times a week    Drug use: No   Sexual activity: Not Currently    Birth control/protection: Post-menopausal  Other Topics Concern   Not on file  Social History Narrative   Lives with husband   no caffiene   Social Drivers of Health   Financial Resource Strain: Low Risk  (01/14/2022)   Overall Financial Resource Strain (CARDIA)    Difficulty of Paying Living Expenses: Not hard at all  Food Insecurity: No Food Insecurity (10/27/2023)   Hunger Vital Sign    Worried About Running Out of Food in the Last Year: Never true    Ran Out of Food in the Last Year: Never true  Transportation Needs: No Transportation Needs (10/27/2023)   PRAPARE - Doctor, general practice (Medical): No    Lack of Transportation (Non-Medical): No  Physical Activity: Insufficiently Active (01/14/2022)   Exercise Vital Sign    Days of Exercise per Week: 2 days    Minutes of Exercise per Session: 20 min  Stress: No Stress Concern Present (01/14/2022)   [employer] of Occupational Health - Occupational Stress Questionnaire    Feeling of Stress : Not at all  Social Connections: Moderately Isolated (10/27/2023)   Social Connection and Isolation Panel    Frequency of Communication with Friends and Family: Three times a week    Frequency of Social Gatherings with Friends and Family: Once a week    Attends Religious Services: Never    Database administrator or Organizations: No    Attends Banker Meetings: Never    Marital Status: Married  Catering manager Violence: Not At Risk (10/27/2023)   Humiliation, Afraid, Rape, and Kick questionnaire    Fear of Current or Ex-Partner: No    Emotionally Abused: No    Physically Abused: No    Sexually Abused: No     Review of Systems   Gen: Denies any fever, chills, fatigue, weight loss, lack of appetite.  CV: Denies chest pain, heart palpitations, peripheral edema, syncope.  Resp: Denies shortness of breath at rest or with exertion. Denies wheezing or cough.  GI: Denies dysphagia or odynophagia. Denies jaundice, hematemesis, fecal incontinence. GU : Denies urinary burning, urinary frequency, urinary hesitancy MS: Denies joint pain, muscle weakness, cramps, or limitation of movement.  Derm: Denies rash, itching, dry skin Psych: Denies depression, anxiety, memory loss, and confusion  Heme: Denies bruising, bleeding, and enlarged lymph nodes.   Physical Exam   BP 130/89   Pulse 92   Temp 98.6 F (37 C)   Ht 5' (1.524 m)   Wt 94 lb 6.4 oz (42.8 kg)   BMI 18.44 kg/m  General:   Alert and oriented. Pleasant and cooperative. Well-nourished and well-developed.  Head:  Normocephalic and  atraumatic. Eyes:  Without icterus Abdomen:  +BS, soft, non-tender and non-distended. No HSM noted. No guarding or rebound. No masses appreciated.  Rectal:  Deferred  Msk:  Symmetrical without gross deformities. Normal posture. Extremities:  Without edema. Neurologic:  Alert and  oriented x4;  grossly normal neurologically. Skin:  Intact without significant lesions or rashes. Psych:  Alert and cooperative. Normal mood and affect.   Assessment   CALY PELLUM is a 68 y.o. female presenting today with a history of  Crohn's disease s/p ileocecectomy 2006 with surgical remission and no maintenance medications, hepatic steatosis with elastography noting low risk of fibrosis and known stable small gallbladder polyp, elevated LFTs, chronic GERD, dysphagia, PUD with recent EGD in interim (May 2025) noting healing, elevated transaminases, last seen April 2025 with recommendations for further serologies for elevated transaminases, now with constipation.  Constipation: due to OIC. Likely pelvic floor dysfunction exacerbating as well. Will do a bowel purge with Miralax  today, then start Linzess  290 mcg samples tomorrow. Colonoscopy on file from Feb 2025 with surveillance due in 2030.   Elevated LFTs: in setting of hepatic steatosis. AST 56, ALT 33, Alk Phos 135 in Oct 2024. As she had been drinking alcohol during prior results, will hold off on repeat for a few weeks as she has been abstaining since July hospitalization. If remains elevated, needs full serologies as previously recommended.       PLAN   Miralax  purge today Linzess  290 mcg samples provided starting tomorrow HFP prior to next appt. Further serologies if remains elevated Keep upcoming appt    [name] MICAEL Stager, PhD, ANP-BC [facility]

## 2023-12-09 ENCOUNTER — Other Ambulatory Visit: Payer: Self-pay | Admitting: Internal Medicine

## 2023-12-09 DIAGNOSIS — J410 Simple chronic bronchitis: Secondary | ICD-10-CM

## 2023-12-15 ENCOUNTER — Telehealth: Payer: Self-pay

## 2023-12-15 NOTE — Telephone Encounter (Signed)
 Copied from CRM [id number]. Topic: Clinical - Lab/Test Results >> Dec 15, 2023 11:53 AM Tobias CROME wrote: Reason for CRM: Patient requesting to speak to office directly about x ray results.   Requesting callback, [phone]

## 2023-12-16 ENCOUNTER — Ambulatory Visit: Payer: Self-pay

## 2023-12-16 ENCOUNTER — Other Ambulatory Visit (HOSPITAL_COMMUNITY)

## 2023-12-16 ENCOUNTER — Emergency Department (HOSPITAL_COMMUNITY)

## 2023-12-16 ENCOUNTER — Encounter (HOSPITAL_COMMUNITY): Payer: Self-pay

## 2023-12-16 ENCOUNTER — Emergency Department (HOSPITAL_COMMUNITY)
Admission: EM | Admit: 2023-12-16 | Discharge: 2023-12-16 | Disposition: A | Discharge status: Home / Self Care | Attending: Emergency Medicine | Admitting: Emergency Medicine

## 2023-12-16 DIAGNOSIS — F1010 Alcohol abuse, uncomplicated: Secondary | ICD-10-CM | POA: Diagnosis not present

## 2023-12-16 DIAGNOSIS — Z79899 Other long term (current) drug therapy: Secondary | ICD-10-CM | POA: Diagnosis not present

## 2023-12-16 DIAGNOSIS — R531 Weakness: Secondary | ICD-10-CM | POA: Diagnosis not present

## 2023-12-16 DIAGNOSIS — I6782 Cerebral ischemia: Secondary | ICD-10-CM | POA: Diagnosis not present

## 2023-12-16 DIAGNOSIS — R519 Headache, unspecified: Secondary | ICD-10-CM | POA: Diagnosis not present

## 2023-12-16 DIAGNOSIS — M542 Cervicalgia: Secondary | ICD-10-CM | POA: Diagnosis not present

## 2023-12-16 DIAGNOSIS — I672 Cerebral atherosclerosis: Secondary | ICD-10-CM | POA: Diagnosis not present

## 2023-12-16 DIAGNOSIS — R200 Anesthesia of skin: Secondary | ICD-10-CM

## 2023-12-16 DIAGNOSIS — K5090 Crohn's disease, unspecified, without complications: Secondary | ICD-10-CM | POA: Insufficient documentation

## 2023-12-16 DIAGNOSIS — G319 Degenerative disease of nervous system, unspecified: Secondary | ICD-10-CM | POA: Diagnosis not present

## 2023-12-16 DIAGNOSIS — I6523 Occlusion and stenosis of bilateral carotid arteries: Secondary | ICD-10-CM | POA: Diagnosis not present

## 2023-12-16 DIAGNOSIS — K219 Gastro-esophageal reflux disease without esophagitis: Secondary | ICD-10-CM | POA: Diagnosis not present

## 2023-12-16 DIAGNOSIS — R202 Paresthesia of skin: Secondary | ICD-10-CM | POA: Diagnosis not present

## 2023-12-16 DIAGNOSIS — I10 Essential (primary) hypertension: Secondary | ICD-10-CM | POA: Diagnosis not present

## 2023-12-16 DIAGNOSIS — I6502 Occlusion and stenosis of left vertebral artery: Secondary | ICD-10-CM | POA: Diagnosis not present

## 2023-12-16 LAB — CBC WITH DIFFERENTIAL/PLATELET
Abs Immature Granulocytes: 0.01 K/uL (ref 0.00–0.07)
Basophils Absolute: 0.1 K/uL (ref 0.0–0.1)
Basophils Relative: 1 %
Eosinophils Absolute: 0.1 K/uL (ref 0.0–0.5)
Eosinophils Relative: 1 %
HCT: 43.9 % (ref 36.0–46.0)
Hemoglobin: 15.1 g/dL — ABNORMAL HIGH (ref 12.0–15.0)
Immature Granulocytes: 0 %
Lymphocytes Relative: 26 %
Lymphs Abs: 1.3 K/uL (ref 0.7–4.0)
MCH: 33.2 pg (ref 26.0–34.0)
MCHC: 34.4 g/dL (ref 30.0–36.0)
MCV: 96.5 fL (ref 80.0–100.0)
Monocytes Absolute: 0.4 K/uL (ref 0.1–1.0)
Monocytes Relative: 8 %
Neutro Abs: 3.3 K/uL (ref 1.7–7.7)
Neutrophils Relative %: 64 %
Platelets: 269 K/uL (ref 150–400)
RBC: 4.55 MIL/uL (ref 3.87–5.11)
RDW: 12.4 % (ref 11.5–15.5)
WBC: 5.2 K/uL (ref 4.0–10.5)
nRBC: 0 % (ref 0.0–0.2)

## 2023-12-16 LAB — PROTIME-INR
INR: 0.9 (ref 0.8–1.2)
Prothrombin Time: 12.8 s (ref 11.4–15.2)

## 2023-12-16 LAB — COMPREHENSIVE METABOLIC PANEL WITH GFR
ALT: 19 U/L (ref 0–44)
AST: 33 U/L (ref 15–41)
Albumin: 3.4 g/dL — ABNORMAL LOW (ref 3.5–5.0)
Alkaline Phosphatase: 154 U/L — ABNORMAL HIGH (ref 38–126)
Anion gap: 11 (ref 5–15)
BUN: 9 mg/dL (ref 8–23)
CO2: 23 mmol/L (ref 22–32)
Calcium: 9 mg/dL (ref 8.9–10.3)
Chloride: 104 mmol/L (ref 98–111)
Creatinine, Ser: 0.57 mg/dL (ref 0.44–1.00)
GFR, Estimated: >60 mL/min (ref >60)
Glucose, Bld: 80 mg/dL (ref 70–99)
Potassium: 4.1 mmol/L (ref 3.5–5.1)
Sodium: 138 mmol/L (ref 135–145)
Total Bilirubin: 0.8 mg/dL (ref 0.0–1.2)
Total Protein: 6.9 g/dL (ref 6.5–8.1)

## 2023-12-16 LAB — RAPID URINE DRUG SCREEN, HOSP PERFORMED
Amphetamines: NOT DETECTED
Barbiturates: NOT DETECTED
Benzodiazepines: NOT DETECTED
Cocaine: NOT DETECTED
Opiates: NOT DETECTED
Tetrahydrocannabinol: NOT DETECTED

## 2023-12-16 LAB — ETHANOL: Alcohol, Ethyl (B): <15 mg/dL (ref <15)

## 2023-12-16 LAB — VITAMIN B12: Vitamin B-12: 227 pg/mL (ref 180–914)

## 2023-12-16 LAB — APTT: aPTT: 30 s (ref 24–36)

## 2023-12-16 MED ORDER — IOHEXOL 350 MG/ML SOLN
75.0000 mL | Freq: Once | INTRAVENOUS | Status: AC | PRN
  Administered 2023-12-16 (×2): 75 mL via INTRAVENOUS

## 2023-12-16 NOTE — Telephone Encounter (Signed)
 FYI Only or Action Required?: FYI only for provider.  Patient was last seen in primary care on 11/24/2023 by Bevely Doffing, FNP.  Called Nurse Triage reporting Neurologic Problem, Headache, and Neck Pain.  Symptoms began several weeks ago.  Interventions attempted: Other: hospitalized in June with imaging done and has followed up twice with PCP.  Symptoms are: blurry vision, unilateral headaches, unilateral left sided numbness and tingling, neck pain gradually worsening.  Triage Disposition: Go to ED Now (or PCP Triage)  Patient/caregiver understands and will follow disposition?: Yes           Copied from CRM [id number]. Topic: Clinical - Red Word Triage >> Dec 16, 2023 11:21 AM Leonette SQUIBB wrote: Red Word that prompted transfer to Nurse Triage: pt is talking about multiple issues she has been having since she was in ICU.  She is very frustrated with the office.  She mentioned she could have a stroke and no one knows.  She had shingles and then got a staff? Infection that put her in icu. Reason for Disposition  Headache  Answer Assessment - Initial Assessment Questions Patient states she had a fall and seizure in June and was hospitalized. She is requesting results from her recent clavicle x ray, read PCP's note: Your x-ray of your clavicle was normal. She would like to know why she still has a lump on her left clavicle and if that is causing her headaches. Patient states she is concerned that her neck or brain may have damage from her fall and states they did not do imaging after her fall. Informed patient that her imaging for CT head and CT cervical were done on 6/23 after her fall. Patient states she was not aware. Patient with extensive complaint list she also is talking about history of Chron's disease,bacteria found on an EGD in her stomach, fever treated with amlodipine , shingles.    1. SYMPTOM: What is the main symptom you are concerned about? (e.g., weakness,  numbness)     Intermittent unilateral headaches (on top of head, temple and into neck), bilateral constant blurry vision (she thinks this could be a side effect from her gabapentin ), intermittent numbness and tingling in left arm and fingers and left leg.  2. ONSET: When did this start? (e.g., minutes, hours, days; while sleeping)     Started end of June, gradually worsening.  3. LAST NORMAL: When was the last time you (the patient) were normal (no symptoms)?     Patient unable to answer, laughing and just repeats that symptoms have been since she got out of the hospital. Her partner states 2-3 weeks.  4. PATTERN Does this come and go, or has it been constant since it started?  Is it present now?     Headaches and numbness are intermittent; numbness and tingling intermittent on left side.  5. CARDIAC SYMPTOMS: Have you had any of the following symptoms: chest pain, difficulty breathing, palpitations?     No.  6. NEUROLOGIC SYMPTOMS: Have you had any of the following symptoms: headache, dizziness, vision loss, double vision, changes in speech, unsteady on your feet?     Blurry vision, headaches, dizziness (intermittent). Denies vision loss, changes in speech.  7. OTHER SYMPTOMS: Do you have any other symptoms?     See triage note.  8. PREGNANCY: Is there any chance you are pregnant? When was your last menstrual period?     N/A.  Protocols used: Neurologic Deficit-A-AH

## 2023-12-16 NOTE — ED Provider Notes (Signed)
 [place] EMERGENCY DEPARTMENT AT [facility] Provider Note   CSN: [id number] Arrival date & time: 12/16/23  1416     Patient presents with: Weakness, Numbness, and Tingling (Left arm)   [name] [name] is a 68 y.o. female.  She has PMH of anxiety, Crohn's disease, GERD, alcohol abuse. Pt complains of waxing and waning left facial arm and leg numbness and tingling with some weakness for several weeks.  She is yesterday was worse but today the symptoms returned again around 10 AM.  She called her PCPs office and they advise she come in for stroke workup.  She states she has good days and bad days some days are worse than others.  She called her PCP today because the symptoms were not going away.  She also gets headache in association with this.   Reports she had a neck injury about a month and a half ago in association with a seizure and was admitted to the hospital.  She thinks that her numbness and tingling could be coming from neck injury.  Patient reports some intermittent blurry vision right greater than left that she states is when she takes higher doses of gabapentin  but this has been an ongoing issue, denies visual field changes or vision loss.    Weakness      Prior to Admission medications   Medication Sig Start Date End Date Taking? Authorizing Provider  benzonatate  (TESSALON ) 100 MG capsule Take 1 capsule (100 mg total) by mouth 2 (two) times daily as needed for cough. 11/12/23   Bevely Doffing, FNP  Buprenorphine HCl-Naloxone HCl 4-1 MG FILM Place 1 Film under the tongue 4 (four) times daily. 07/23/23   [provider]  calcium  carbonate (TUMS - DOSED IN MG ELEMENTAL CALCIUM ) 500 MG chewable tablet Chew 1 tablet (200 mg of elemental calcium  total) by mouth 3 (three) times daily with meals. 03/02/19   Nida, Gebreselassie W, MD  clotrimazole  (CLOTRIMAZOLE  AF) 1 % cream Apply 1 application topically 2 (two) times daily. To affected area for 1-2 weeks 01/04/20    Bari Theodoro FALCON, MD  cyclobenzaprine  (FLEXERIL ) 5 MG tablet Take 1 tablet (5 mg total) by mouth 3 (three) times daily as needed for muscle spasms. 11/12/23   Bevely Doffing, FNP  dexlansoprazole  (DEXILANT ) 60 MG capsule Take 1 capsule (60 mg total) by mouth daily. 10/31/23 10/30/24  Pearlean Manus, MD  folic acid  (FOLVITE ) 1 MG tablet Take 1 tablet (1 mg total) by mouth daily. 11/01/23   Pearlean Manus, MD  gabapentin  (NEURONTIN ) 300 MG capsule Take 1 capsule (300 mg total) by mouth 3 (three) times daily. 10/31/23   Pearlean Manus, MD  lidocaine  (LIDODERM ) 5 % Place 1 patch onto the skin daily. 05/19/23   [provider]  linaclotide  (LINZESS ) 290 MCG CAPS capsule Take 1 capsule (290 mcg total) by mouth daily before breakfast. 12/03/23   Shirlean Therisa ORN, NP  magnesium  hydroxide (MILK OF MAGNESIA) 400 MG/5ML suspension Take 30 mLs by mouth daily as needed for mild constipation.    [provider]  Multiple Vitamin (MULTIVITAMIN WITH MINERALS) TABS tablet Take 1 tablet by mouth daily. 11/01/23   Pearlean Manus, MD  nystatin  (MYCOSTATIN ) 100000 UNIT/ML suspension TAKE 5 MLS BY MOUTH 4 TIMES DAILY AS NEEDED. 09/25/23   Melvenia Manus BRAVO, MD  SYMBICORT  80-4.5 MCG/ACT inhaler Inhale 2 puffs by mouth twice daily 12/09/23   Bevely Doffing, FNP  thiamine  (VITAMIN B-1) 100 MG tablet Take 1 tablet (100 mg total)  by mouth daily. 11/01/23   Pearlean Manus, MD  triamcinolone  (KENALOG ) 0.1 % Apply 1 application topically 2 (two) times daily. 07/28/20   Tobie Suzzane POUR, MD  VENTOLIN  HFA 108 (90 Base) MCG/ACT inhaler INHALE 2 PUFFS BY MOUTH EVERY 6 HOURS AS NEEDED FOR WHEEZING OR SHORTNESS OF BREATH 12/09/23   Bevely Doffing, FNP    Allergies: Naproxen, Dicyclomine  hcl, Duloxetine, Lovastatin, Toradol  [ketorolac  tromethamine ], Tramadol, and Nexium  [esomeprazole  magnesium ]    Review of Systems  Neurological:  Positive for weakness.    Updated Vital Signs BP (!) 148/91   Pulse 71   Temp 98.7 F  (37.1 C) (Oral)   Resp 12   Ht 5' (1.524 m)   Wt 42.8 kg   SpO2 95%   BMI 18.44 kg/m   Physical Exam Vitals and nursing note reviewed.  Constitutional:      General: She is not in acute distress.    Appearance: She is well-developed.  HENT:     Head: Normocephalic and atraumatic.     Mouth/Throat:     Mouth: Mucous membranes are moist.  Eyes:     General: Lids are normal.     Extraocular Movements: Extraocular movements intact.     Right eye: Normal extraocular motion and no nystagmus.     Left eye: Normal extraocular motion and no nystagmus.     Conjunctiva/sclera: Conjunctivae normal.     Pupils: Pupils are equal, round, and reactive to light.     Visual Fields: Right eye visual fields normal and left eye visual fields normal.  Cardiovascular:     Rate and Rhythm: Normal rate and regular rhythm.     Heart sounds: No murmur heard. Pulmonary:     Effort: Pulmonary effort is normal. No respiratory distress.     Breath sounds: Normal breath sounds.  Abdominal:     Palpations: Abdomen is soft.     Tenderness: There is no abdominal tenderness.  Musculoskeletal:        General: No swelling.     Cervical back: Neck supple.  Skin:    General: Skin is warm and dry.     Capillary Refill: Capillary refill takes less than 2 seconds.  Neurological:     General: No focal deficit present.     Mental Status: She is alert and oriented to person, place, and time.     Cranial Nerves: No cranial nerve deficit.     Motor: No weakness.     Coordination: Finger-Nose-Finger Test and Heel to [employer] normal.     Gait: Gait normal.     Comments: Decreased sensation to left face arm and leg  Psychiatric:        Mood and Affect: Mood normal.     (all labs ordered are listed, but only abnormal results are displayed) Labs Reviewed  COMPREHENSIVE METABOLIC PANEL WITH GFR - Abnormal; Notable for the following components:      Result Value   Albumin 3.4 (*)    Alkaline Phosphatase 154 (*)     All other components within normal limits  CBC WITH DIFFERENTIAL/PLATELET - Abnormal; Notable for the following components:   Hemoglobin 15.1 (*)    All other components within normal limits  ETHANOL  PROTIME-INR  APTT  RAPID URINE DRUG SCREEN, HOSP PERFORMED  CBC WITH DIFFERENTIAL/PLATELET  VITAMIN B12    EKG: EKG Interpretation Date/Time:  Tuesday December 16 2023 15:18:58 EDT Ventricular Rate:  83 PR Interval:  196 QRS Duration:  103 QT Interval:  399 QTC Calculation: 469 R Axis:   -29  Text Interpretation: Sinus rhythm Borderline left axis deviation Low voltage, precordial leads Confirmed by Cleotilde Rogue ([id number]) on 12/16/2023 3:20:44 PM  Radiology: CT ANGIO HEAD NECK W WO CM Result Date: 12/16/2023 CLINICAL DATA:  Initial evaluation for acute neuro deficit, stroke. EXAM: CT ANGIOGRAPHY HEAD AND NECK WITH AND WITHOUT CONTRAST TECHNIQUE: Multidetector CT imaging of the head and neck was performed using the standard protocol during bolus administration of intravenous contrast. Multiplanar CT image reconstructions and MIPs were obtained to evaluate the vascular anatomy. Carotid stenosis measurements (when applicable) are obtained utilizing NASCET criteria, using the distal internal carotid diameter as the denominator. RADIATION DOSE REDUCTION: This exam was performed according to the departmental dose-optimization program which includes automated exposure control, adjustment of the mA and/or kV according to patient size and/or use of iterative reconstruction technique. CONTRAST:  75mL OMNIPAQUE  IOHEXOL  350 MG/ML SOLN COMPARISON:  Prior study from 10/27/2023 FINDINGS: CT HEAD FINDINGS Brain: Age-related cerebral atrophy with mild chronic microvascular ischemic disease. No acute intracranial hemorrhage. No acute cortically based infarct. No mass lesion or midline shift. No hydrocephalus or extra-axial fluid collection. Vascular: No abnormal hyperdense vessel. Calcified atherosclerosis  present at the skull base. Skull: Scalp soft tissues within normal limits.  Calvarium intact. Sinuses/Orbits: Globes orbital soft tissues within normal limits. Paranasal sinuses are largely clear. No mastoid effusion. Other: None. Review of the MIP images confirms the above findings CTA NECK FINDINGS Aortic arch: Visualized aortic arch within normal limits for caliber. Aortic atherosclerosis. Standard 3 vessel morphology. No high-grade stenosis about the origin the great vessels. Right carotid system: Right common and internal carotid arteries are patent w without dissection. Mild atheromatous change about the right carotid artery system without significant stenosis. Left carotid system: Left common and internal carotid arteries are patent without dissection. Atheromatous change about the left carotid bulb without hemodynamically significant stenosis. Vertebral arteries: Both vertebral arteries arise from subclavian arteries. Atheromatous change at the origin the left vertebral artery with mild stenosis. Vertebral arteries patent distally without stenosis or dissection. Skeleton: No worrisome osseous lesions. Moderate spondylosis at C5-6 and C6-7. Other neck: No other acute finding. Upper chest: No other acute finding. Review of the MIP images confirms the above findings CTA HEAD FINDINGS Anterior circulation: Moderate atheromatous change about the carotid siphons with associated mild to moderate multifocal narrowing bilaterally. A1 segments patent bilaterally. Normal anterior communicating complex. Anterior cerebral arteries patent without stenosis. No M1 stenosis or occlusion. Distal MCA branches perfused and symmetric. Posterior circulation: Atheromatous change about the V4 segments bilaterally without significant stenosis. Both PICA patent. Basilar patent without stenosis. Superior cerebral arteries patent bilaterally. Right PCA supplied via the basilar. Fetal type origin left PCA. Both PCAs patent without  stenosis. Venous sinuses: Patent allowing for timing the contrast bolus. Anatomic variants: Fetal type left PCA. No aneurysm. Review of the MIP images confirms the above findings IMPRESSION: CT HEAD: 1. No acute intracranial abnormality. 2. Age-related cerebral atrophy with mild chronic small vessel ischemic disease. CTA HEAD AND NECK: 1. Negative CTA for large vessel occlusion or other emergent finding. 2. Atheromatous change about the carotid siphons with associated mild to moderate multifocal narrowing bilaterally. 3. Atheromatous change at the origin of the left vertebral artery with mild stenosis. Aortic Atherosclerosis (ICD10-I70.0). Electronically Signed   By: Morene Hoard M.D.   On: 12/16/2023 20:08     Procedures   Medications Ordered in the ED  iohexol  (OMNIPAQUE ) 350 MG/ML injection 75 mL (  75 mLs Intravenous Contrast Given 12/16/23 1731)                                    Medical Decision Making This patient presents to the ED for concern of left-sided numbness and tingling intermittently for the past several weeks, today's episode started 4-1/2 hours before arrival and is less severe than the episode she had yesterday, this involves an extensive number of treatment options, and is a complaint that carries with it a high risk of complications and morbidity.  The differential diagnosis includes CVA, intracranial mass, HNP, MS, other   Co morbidities that complicate the patient evaluation :   GERD, degenerative disc disease, alcohol abuse, Crohn's disease   Additional history obtained:  Additional history obtained from EMR External records from outside source obtained and reviewed including notes, labs, MRI C-spine from November 18, 2023   Lab Tests:  I Ordered, and personally interpreted labs.  The pertinent results include: CBC, CMP reassuring, alcohol negative, normal PT and PTT, negative UDS   Imaging Studies ordered:  I ordered imaging studies including CTA head and  C-spine which shows no acute intracranial hemorrhage, no LVO I independently visualized and interpreted imaging within scope of identifying emergent findings  I agree with the radiologist interpretation   Cardiac Monitoring: / EKG:  The patient was maintained on a cardiac monitor.  I personally viewed and interpreted the cardiac monitored which showed an underlying rhythm of: Sinus rhythm    Problem List / ED Course / Critical interventions / Medication management  Left-sided numbness of the face arm and leg-no weakness, no other focal neurologic deficits, this been ongoing intermittently for about 3 weeks.  She is having waxing and waning episodes.  Out of any TNK window for acute stroke alert today so no stroke alert was called.  She is able to ambulate, her only complaint is this numbness and weakness.  Says she said she been having a lot of neck pain as well we will get a CTA head and neck that showed no acute findings, discussed patient there are chronic findings per radiology report and need for follow-up.  Patient was very anxious to leave and splinted and going home so discussed that would like to get MRI to definitively rule out stroke though this is less likely given the ongoing symptoms.  She is going to come back tomorrow for outpatient MRI.  She is instructed that if this is negative she will follow-up close with her PCP for continued numbness workup.  Also ordered a B12 level and advise she follow-up with her PCP for this.  She is agreeable plan of care and discharge.  I have reviewed the patients home medicines and have made adjustments as needed   Social Determinants of Health:  Patient lives with her husband      Amount and/or Complexity of Data Reviewed Labs: ordered. Radiology: ordered.  Risk Prescription drug management.        Final diagnoses:  Numbness    ED Discharge Orders          Ordered    MR BRAIN WO CONTRAST        12/16/23 2029                Suellen Sherran DELENA DEVONNA 12/16/23 2104    Cleotilde Rogue, MD 12/18/23 1253

## 2023-12-16 NOTE — ED Provider Triage Note (Signed)
 Emergency Medicine Provider Triage Evaluation Note  [name] [name] , [name] 68 y.o. female  was evaluated in triage.  Pt complains of waxing and waning left facial arm and leg numbness and tingling with some weakness for several weeks.  She is yesterday was worse but today the symptoms returned again around 10 AM.  She called her PCPs office and they advise she come in for stroke workup.  She states she has good days and bad days some days are worse than others.  She called her PCP today because the symptoms were not going away.  She also gets headache in association with this.  Reports she had [name] neck injury about [name] month and [name] half ago in association with [name] seizure and was admitted to the hospital.  She thinks that her numbness and tingling could be coming from neck injury.  Review of Systems  Positive: Headache, numbness and tingling Negative: Loss of consciousness  Physical Exam  BP (!) 159/118 (BP Location: Right Arm)   Pulse 88   Temp 98.7 F (37.1 C) (Oral)   Resp 17   Ht 5' (1.524 m)   Wt 42.8 kg   SpO2 98%   BMI 18.44 kg/m  Gen:   Awake, no distress   Resp:  Normal effort  MSK:   Moves extremities without difficulty  Other:  No drift in extremities, normal grip strength bilaterally, grossly normal visual fields, decreased sensation left arm   Medical Decision Making  Medically screening exam initiated at 2:33 PM.  Appropriate orders placed.  [name] [name] was informed that the remainder of the evaluation will be completed by another provider, this initial triage assessment does not replace that evaluation, and the importance of remaining in the ED until their evaluation is complete.  Patient informed symptoms concerning for possible stroke but she is out of window for TNK, no need for stroke alert as the symptoms have been waxing and waning for several weeks, today symptoms started over 4-1/2 hours ago so no indication for stroke alert but will do stroke workup.  Given her complaint  of recent neck injury will get CTA head and neck.   [name] Cantor A, PA-C 12/16/23 1440

## 2023-12-16 NOTE — Discharge Instructions (Addendum)
 It was a pleasure taking care of you today.  You are seen for numbness and tingling on your left side of your body.  We did a CT scan with contrast of your head and neck shows some hardening of the arteries that does not explain your symptoms.  To further work this up would like you to come back tomorrow and come to the ED desk and tell them you are here for your outpatient MRI.  If this is negative and shows no sign of a recent stroke you will need to follow-up closely with your PCP for further workup of the numbness.  I also ordered a B12 level which you can follow-up with your PCP for as this level can sometimes be low and cause numbness.  If you have any new or worsening symptoms such as weakness, your headache, persistent vomiting, vision loss, trouble with your balance come back to the ER right away.

## 2023-12-16 NOTE — ED Triage Notes (Signed)
 Wakes up this morning with headache, left arm tingling and numbness for the past 2 days. Pt does have a slight left leg drift. Family states that is new. Left side of face numb and tingling for the past few weeks. A&Ox4.   Pt does have a some changes in vision   Last well known is 10 am today

## 2023-12-16 NOTE — Telephone Encounter (Signed)
 Noted, patient advised to go to ED

## 2023-12-16 NOTE — ED Notes (Signed)
 Pt is ambulatory from triage to room

## 2023-12-17 ENCOUNTER — Ambulatory Visit (HOSPITAL_COMMUNITY)
Admission: RE | Admit: 2023-12-17 | Discharge: 2023-12-17 | Disposition: A | Discharge status: Home / Self Care | Source: Ambulatory Visit

## 2023-12-17 ENCOUNTER — Ambulatory Visit: Payer: Self-pay

## 2023-12-17 ENCOUNTER — Other Ambulatory Visit: Payer: Self-pay

## 2023-12-17 ENCOUNTER — Telehealth: Payer: Self-pay

## 2023-12-17 DIAGNOSIS — R200 Anesthesia of skin: Secondary | ICD-10-CM

## 2023-12-17 DIAGNOSIS — I6782 Cerebral ischemia: Secondary | ICD-10-CM | POA: Diagnosis not present

## 2023-12-17 DIAGNOSIS — M542 Cervicalgia: Secondary | ICD-10-CM | POA: Insufficient documentation

## 2023-12-17 DIAGNOSIS — G319 Degenerative disease of nervous system, unspecified: Secondary | ICD-10-CM | POA: Diagnosis not present

## 2023-12-17 DIAGNOSIS — M47813 Spondylosis without myelopathy or radiculopathy, cervicothoracic region: Secondary | ICD-10-CM | POA: Diagnosis not present

## 2023-12-17 DIAGNOSIS — R519 Headache, unspecified: Secondary | ICD-10-CM | POA: Diagnosis not present

## 2023-12-17 DIAGNOSIS — M47812 Spondylosis without myelopathy or radiculopathy, cervical region: Secondary | ICD-10-CM | POA: Diagnosis not present

## 2023-12-17 DIAGNOSIS — M50221 Other cervical disc displacement at C4-C5 level: Secondary | ICD-10-CM | POA: Diagnosis not present

## 2023-12-17 DIAGNOSIS — M47814 Spondylosis without myelopathy or radiculopathy, thoracic region: Secondary | ICD-10-CM | POA: Diagnosis not present

## 2023-12-17 LAB — CBG MONITORING, ED: Glucose-Capillary: 83 mg/dL (ref 70–99)

## 2023-12-17 NOTE — Telephone Encounter (Signed)
 Already handled.

## 2023-12-17 NOTE — Telephone Encounter (Signed)
 MRI of neck and brain has been ordered by Le[facility]

## 2023-12-17 NOTE — Telephone Encounter (Signed)
 Patient states the ED told her she needs to have an MRI done and it needs to be ordered by Leita asap. Patient is not too happy. Please advise Thank you

## 2023-12-17 NOTE — Telephone Encounter (Signed)
 FYI Only or Action Required?: Action required by provider: referral request.  Patient was last seen in primary care on 11/24/2023 by Bevely Doffing, FNP.  Called Nurse Triage reporting referral request.  Symptoms began today.  Interventions attempted: Nothing.  Symptoms are: unchanged.  Triage Disposition: Urgent Home Treatment With Follow-up Call  Patient/caregiver understands and will follow disposition?: YesCopied from CRM [id number]. Topic: Clinical - Medical Advice >> Dec 17, 2023 10:48 AM [name] wrote: Reason for CRM: Pt called to speak with tirage nurse about MRI Reason for Disposition  Triager needs further essential information from caller in order to complete triage  Answer Assessment - Initial Assessment Questions 1. REASON FOR CALL: What is the main reason for your call? or How can I best help you? Pt wants PCP to order MRI per ED visit. This needs to be done today. CAL notifited and stated office will call pt back after PCP reviews.  Protocols used: Information Only Call - No Triage-A-AH

## 2023-12-17 NOTE — Telephone Encounter (Signed)
 [name] [name] is ordering the MRI or brain and Neck, I advised her that as soon as the order is in I will personally give her a call and let her know

## 2023-12-18 ENCOUNTER — Ambulatory Visit (INDEPENDENT_AMBULATORY_CARE_PROVIDER_SITE_OTHER)

## 2023-12-18 VITALS — BP 136/94 | HR 95 | Resp 14 | Ht 60.0 in | Wt 95.0 lb

## 2023-12-18 DIAGNOSIS — H9193 Unspecified hearing loss, bilateral: Secondary | ICD-10-CM

## 2023-12-18 DIAGNOSIS — Z0001 Encounter for general adult medical examination with abnormal findings: Secondary | ICD-10-CM | POA: Diagnosis not present

## 2023-12-18 DIAGNOSIS — M810 Age-related osteoporosis without current pathological fracture: Secondary | ICD-10-CM | POA: Diagnosis not present

## 2023-12-18 DIAGNOSIS — E039 Hypothyroidism, unspecified: Secondary | ICD-10-CM | POA: Diagnosis not present

## 2023-12-18 DIAGNOSIS — Z0000 Encounter for general adult medical examination without abnormal findings: Secondary | ICD-10-CM

## 2023-12-18 DIAGNOSIS — Z780 Asymptomatic menopausal state: Secondary | ICD-10-CM

## 2023-12-18 DIAGNOSIS — Z1231 Encounter for screening mammogram for malignant neoplasm of breast: Secondary | ICD-10-CM

## 2023-12-19 NOTE — Progress Notes (Signed)
 Subjective:   [name] [name] is a 68 y.o. who presents for a Medicare Wellness preventive visit.  As a reminder, Annual Wellness Visits don't include a physical exam, and some assessments may be limited, especially if this visit is performed virtually. We may recommend an in-person follow-up visit with your provider if needed.  Visit Complete: In person  Persons Participating in Visit: Patient.  AWV Questionnaire: Yes: Patient Medicare AWV questionnaire was completed by the patient on 12/18/2023; I have confirmed that all information answered by patient is correct and no changes since this date.  Cardiac Risk Factors include: advanced age (>51men, >93 women);hypertension;sedentary lifestyle;Other (see comment);dyslipidemia     Objective:    Today's Vitals   12/18/23 0908  BP: (!) 136/94  Pulse: 95  Resp: 14  SpO2: 94%  Weight: 95 lb (43.1 kg)  Height: 5' (1.524 m)  PainSc: 3   PainLoc: Back   Body mass index is 18.55 kg/m.     12/18/2023    8:55 AM 12/16/2023    2:27 PM 10/27/2023    1:00 PM 10/27/2023    4:12 AM 09/10/2023    8:41 AM 06/23/2023    9:26 AM 06/19/2023   10:12 AM  Advanced Directives  Does Patient Have a Medical Advance Directive? No No  No No No No  Would patient like information on creating a medical advance directive? No - Patient declined No - Patient declined No - Patient declined  No - Patient declined No - Patient declined No - Patient declined    Current Medications (verified) Outpatient Encounter Medications as of 12/18/2023  Medication Sig   benzonatate  (TESSALON ) 100 MG capsule Take 1 capsule (100 mg total) by mouth 2 (two) times daily as needed for cough.   Buprenorphine HCl-Naloxone HCl 4-1 MG FILM Place 1 Film under the tongue 4 (four) times daily.   calcium  carbonate (TUMS - DOSED IN MG ELEMENTAL CALCIUM ) 500 MG chewable tablet Chew 1 tablet (200 mg of elemental calcium  total) by mouth 3 (three) times daily with meals.   clotrimazole   (CLOTRIMAZOLE  AF) 1 % cream Apply 1 application topically 2 (two) times daily. To affected area for 1-2 weeks   cyclobenzaprine  (FLEXERIL ) 5 MG tablet Take 1 tablet (5 mg total) by mouth 3 (three) times daily as needed for muscle spasms.   dexlansoprazole  (DEXILANT ) 60 MG capsule Take 1 capsule (60 mg total) by mouth daily.   folic acid  (FOLVITE ) 1 MG tablet Take 1 tablet (1 mg total) by mouth daily.   gabapentin  (NEURONTIN ) 300 MG capsule Take 1 capsule (300 mg total) by mouth 3 (three) times daily.   lidocaine  (LIDODERM ) 5 % Place 1 patch onto the skin daily.   linaclotide  (LINZESS ) 290 MCG CAPS capsule Take 1 capsule (290 mcg total) by mouth daily before breakfast.   magnesium  hydroxide (MILK OF MAGNESIA) 400 MG/5ML suspension Take 30 mLs by mouth daily as needed for mild constipation.   Multiple Vitamin (MULTIVITAMIN WITH MINERALS) TABS tablet Take 1 tablet by mouth daily.   nystatin  (MYCOSTATIN ) 100000 UNIT/ML suspension TAKE 5 MLS BY MOUTH 4 TIMES DAILY AS NEEDED.   SYMBICORT  80-4.5 MCG/ACT inhaler Inhale 2 puffs by mouth twice daily   thiamine  (VITAMIN B-1) 100 MG tablet Take 1 tablet (100 mg total) by mouth daily.   triamcinolone  (KENALOG ) 0.1 % Apply 1 application topically 2 (two) times daily.   VENTOLIN  HFA 108 (90 Base) MCG/ACT inhaler INHALE 2 PUFFS BY MOUTH EVERY 6 HOURS AS NEEDED FOR  WHEEZING OR SHORTNESS OF BREATH   No facility-administered encounter medications on file as of 12/18/2023.    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   Crohn disease (HCC)    CTS (carpal tunnel syndrome)    Diarrhea MULITIFACTORIAL   IBS, LACTOSE  INTOLERANCE, SBBO, BILE-SALT   Elevated liver enzymes 2009: BMI 24 ?Etoh ALT 94, AST 51,  NEG ANA, qIGs& ASMA   MAY 2012 AST 52 ALT 38   Esophageal stricture 2009   GERD (gastroesophageal reflux disease)    Hemorrhoid    Hyperlipemia    Hypertension    Inflammatory bowel disease 2006 CD   SURGICAL REMISSION   LBP (low back pain)    Mental disorder    Migraine    Osteoporosis    PONV (postoperative nausea and vomiting)    Shortness of breath    exertion and humidity   Shoulder pain    right   Sleep apnea    Stop Bang Cock score of 4   Past Surgical History:  Procedure Laterality Date   BACK SURGERY     BACK SURGERY  07/16/2018   BILATERAL SALPINGOOPHORECTOMY     BIOPSY  12/24/2011   Procedure: BIOPSY;  Surgeon: Margo LITTIE Haddock, MD;  [facility]: [facility];  Service: Endoscopy;;  Gastric Biopsies   BIOPSY N/A 06/30/2012   Procedure: BIOPSY;  Surgeon: Margo LITTIE Haddock, MD;  [facility]: [facility];  Service: Endoscopy;  Laterality: N/A;  Gastric and Esophageal Biopsies   BIOPSY  06/23/2023   Procedure: BIOPSY;  Surgeon: Cindie Carlin POUR, DO;  [facility];  Service: Endoscopy;;   CARPAL TUNNEL RELEASE  LEFT   CATARACT EXTRACTION W/PHACO Right 07/30/2016   Procedure: CATARACT EXTRACTION PHACO AND INTRAOCULAR LENS PLACEMENT (IOC);  Surgeon: Oneil Platts, MD;  Location: [facility];  Service: Ophthalmology;  Laterality: Right;  CDE: 5.45   CATARACT EXTRACTION W/PHACO Left 08/13/2016   Procedure: CATARACT EXTRACTION PHACO AND INTRAOCULAR LENS PLACEMENT (IOC);  Surgeon: Oneil Platts, MD;  [facility]: [facility];  Service: Ophthalmology;  Laterality: Left;  CDE: 5.59   COLON SURGERY     COLONOSCOPY  JAN 2009 DIARRHEA, ABD PAIN, BRBPR   ANASTOMOTIC ULCER(3MM), IH Ak:AZWPHW ULCER, NL COLON bX   COLONOSCOPY WITH PROPOFOL  N/A 04/21/2018   examined portion of the ileum normal, two 3-5 mm polyps, diverticulosis in the rectosigmoid and sigmoid colon, external and internal hemorrhoids, significant looping of  the colon.  Pathology with tubular adenomas.  Recommendations to follow high-fiber diet and repeat colonoscopy in 5 to 10 years with peds colonoscope.   COLONOSCOPY WITH PROPOFOL  N/A 06/23/2023   Procedure: COLONOSCOPY WITH PROPOFOL ;  Surgeon: Cindie Carlin POUR, DO;  [facility];  Service: Endoscopy;  Laterality: N/A;  10:00 am, asa 3   DILATION AND CURETTAGE OF UTERUS     ESOPHAGEAL DILATION  12/24/2011   SLF: A stricture was found in the distal esophagus/ Moderate gastritis/ MILD Duodenitis   ESOPHAGEAL DILATION N/A 06/23/2023   Procedure: ESOPHAGEAL DILATION;  Surgeon: Cindie Carlin POUR, DO;  [facility];  Service: Endoscopy;  Laterality: N/A;   ESOPHAGOGASTRODUODENOSCOPY  02/07/09   mild gastritis   ESOPHAGOGASTRODUODENOSCOPY N/A 09/11/2023   Procedure: EGD (ESOPHAGOGASTRODUODENOSCOPY);  Surgeon: Cindie Carlin POUR, DO;  [facility];  Service: Endoscopy;  Laterality: N/A;  8:15 AM, ASA 3   ESOPHAGOGASTRODUODENOSCOPY (EGD) WITH PROPOFOL  N/A 06/30/2012   SLF: 1. No definite stricture appreciated 2. small hiatal hernia 3. Gastritis   ESOPHAGOGASTRODUODENOSCOPY (EGD) WITH PROPOFOL  N/A 02/20/2016   Procedure: ESOPHAGOGASTRODUODENOSCOPY (EGD) WITH PROPOFOL ;  Surgeon: Margo LITTIE Haddock, MD;  [facility];  Service: Endoscopy;  Laterality: N/A;  930   ESOPHAGOGASTRODUODENOSCOPY (EGD) WITH PROPOFOL  N/A 11/24/2018   benign-appearing esophageal peptic stricture s/p dilation, small hiatal hernia, moderate erosive gastritis.    ESOPHAGOGASTRODUODENOSCOPY (EGD) WITH PROPOFOL  N/A 06/23/2023   Procedure: ESOPHAGOGASTRODUODENOSCOPY (EGD) WITH PROPOFOL ;  Surgeon: Cindie Carlin POUR, DO;  [facility];  Service: Endoscopy;  Laterality: N/A;   EYE SURGERY     gum flap Bilateral    HEMICOLECTOMY  RIGHT 2006   HERNIA REPAIR     LUMBAR DISC SURGERY     POLYPECTOMY  04/21/2018   Procedure: POLYPECTOMY;  Surgeon: Haddock Margo LITTIE, MD;  [facility];   Service: Endoscopy;;  (colon)   SAVORY DILATION N/A 06/30/2012   Procedure: SAVORY DILATION;  Surgeon: Margo LITTIE Haddock, MD;  [facility];  Service: Endoscopy;  Laterality: N/A;  12.30mm , 14mm, 15mm   SAVORY DILATION N/A 02/20/2016   Procedure: SAVORY DILATION;  Surgeon: Margo LITTIE Haddock, MD;  [facility];  Service: Endoscopy;  Laterality: N/A;   SAVORY DILATION N/A 11/24/2018   Procedure: SAVORY DILATION;  Surgeon: Haddock Margo LITTIE, MD;  [facility];  Service: Endoscopy;  Laterality: N/A;   UPPER GASTROINTESTINAL ENDOSCOPY  JAN 2009 ABD PAIN   GASTRITIS, ESO RING   UPPER GASTROINTESTINAL ENDOSCOPY  OCT 2010 ABD PAIN, DYSPHAGIA   DIL , GASTRITIS, NL DUODENUM   UPPER GASTROINTESTINAL ENDOSCOPY  OCT 2010 DYSPHAGIA   DIL 17 MM, GASTRITIS, NL DUODENUM   vocal cord surgery  Sep 20, 2010   precancerous areas removed   wisdom tooth extraction Right    pin placement due to broken jaw   Family History  Problem Relation Age of Onset   Hypothyroidism Mother    Heart disease Father    Parkinson's disease Father    Hypothyroidism Daughter    Heart attack Paternal Grandfather    Alzheimer's disease Paternal Grandmother    Heart attack Maternal Grandfather    Crohn's disease Sister    Other Sister        was murdered   Crohn's disease Maternal Uncle    Colon cancer Neg Hx    Colon polyps Neg Hx    Social History   Socioeconomic History   Marital status: Married    Spouse name: Lemond   Number of children: 2   Years of education: GED   Highest education level: Not on file  Occupational History    Comment: disabled  Tobacco Use   Smoking status: Every Day    Current packs/day: 0.50    Average packs/day: 0.5 packs/day for 48.6 years (24.3 ttl pk-yrs)    Types: Cigarettes    Start date: 1977   Smokeless tobacco: Never  Vaping Use   Vaping status: Never Used  Substance and Sexual Activity   Alcohol use: Not Currently    Comment: wine/beer a few times a week     Drug use: No   Sexual activity: Not Currently  Birth control/protection: Post-menopausal  Other Topics Concern   Not on file  Social History Narrative   Lives with husband   no caffiene   Social Drivers of Health   Financial Resource Strain: Low Risk  (12/19/2023)   Overall Financial Resource Strain (CARDIA)    Difficulty of Paying Living Expenses: Not hard at all  Food Insecurity: No Food Insecurity (12/19/2023)   Hunger Vital Sign    Worried About Running Out of Food in the Last Year: Never true    Ran Out of Food in the Last Year: Never true  Transportation Needs: No Transportation Needs (12/19/2023)   PRAPARE - Administrator, Civil Service (Medical): No    Lack of Transportation (Non-Medical): No  Physical Activity: Inactive (12/19/2023)   Exercise Vital Sign    Days of Exercise per Week: 0 days    Minutes of Exercise per Session: 0 min  Stress: No Stress Concern Present (12/19/2023)   [employer] of Occupational Health - Occupational Stress Questionnaire    Feeling of Stress: Not at all  Social Connections: Moderately Isolated (12/19/2023)   Social Connection and Isolation Panel    Frequency of Communication with Friends and Family: Three times a week    Frequency of Social Gatherings with Friends and Family: Once a week    Attends Religious Services: Never    Database administrator or Organizations: No    Attends Engineer, structural: Never    Marital Status: Married    Tobacco Counseling Ready to quit: Yes Counseling given: Yes    Clinical Intake:  Pre-visit preparation completed: Yes  Pain : 0-10 Pain Score: 3  (lower back) Pain Type: Chronic pain Pain Location: Back Pain Orientation: Lower Pain Descriptors / Indicators: Constant Pain Onset: More than a month ago Pain Frequency: Constant     BMI - recorded: 18.55 Nutritional Risks: None Diabetes: No  Lab Results  Component Value Date   HGBA1C 5.0 09/08/2023   HGBA1C  4.9 02/13/2023   HGBA1C 4.9 12/27/2021     How often do you need to have someone help you when you read instructions, pamphlets, or other written materials from your doctor or pharmacy?: 1 - Never  Interpreter Needed?: No  Information entered by :: [facility]   Activities of Daily Living     12/19/2023    1:47 PM 10/27/2023    1:03 PM  In your present state of health, do you have any difficulty performing the following activities:  Hearing? 0 0  Vision? 0 0  Difficulty concentrating or making decisions? 0 1  Walking or climbing stairs? 1   Dressing or bathing? 0   Doing errands, shopping? 1   Comment can manage her home by herself but doesn't drive herself due to certain medications he takes because she is worried about the side effects.   Preparing Food and eating ? N   Using the Toilet? N   In the past six months, have you accidently leaked urine? N   Do you have problems with loss of bowel control? N   Managing your Medications? N   Managing your Finances? N   Housekeeping or managing your Housekeeping? N     Patient Care Team: Bevely Doffing, FNP as PCP - General (Family Medicine) Cindie Carlin POUR, DO as Consulting Physician (Internal Medicine) Blinda Katz, DPM as Consulting Physician (Podiatry) Mavis Purchase, MD as Consulting Physician (Neurosurgery)  I have updated your Care Teams any recent Medical  Services you may have received from other providers in the past year.     Assessment:   This is a routine wellness examination for Jolina.  Hearing/Vision screen Hearing Screening - Comments:: Patient denies any hearing difficulties.   Vision Screening - Comments:: Wears rx glasses - up to date with routine eye exams with  [facility]il Kawasaki at [facility]. Patient is a former patient of Dr. Oneil Platts. Information provided for her to contact [facility] to schedule an appointment.    Goals Addressed               This Visit's  Progress     Manage Pain (pt-stated)   On track     I'd like to travel more and reduce my pain so that I could enjoy the travels      Patient Stated   On track     Would like to start feeling better       Patient Stated   On track     Be able to do more housework.       Depression Screen     12/19/2023    3:17 PM 11/24/2023    9:09 AM 11/12/2023   10:31 AM 10/02/2023    2:34 PM 09/08/2023   11:29 AM 07/29/2023   10:39 AM 02/13/2023    9:58 AM  PHQ 2/9 Scores  PHQ - 2 Score  2 2 2 1 2 3   PHQ- 9 Score  5 6 4 4 10 12   Exception Documentation Patient refusal          Fall Risk     12/18/2023    9:15 AM 11/24/2023    9:09 AM 11/12/2023   10:31 AM 10/02/2023    2:34 PM 09/08/2023   11:28 AM  Fall Risk   Falls in the past year? 1 1 1  0 0  Number falls in past yr: 1 0 0 0 0  Injury with Fall? 1 1 1  0 0  Risk for fall due to : History of fall(s);Impaired balance/gait;Impaired mobility History of fall(s);Impaired balance/gait Impaired balance/gait;History of fall(s) No Fall Risks No Fall Risks  Follow up Falls evaluation completed;Education provided;Falls prevention discussed Falls evaluation completed Falls evaluation completed Falls evaluation completed Falls evaluation completed    MEDICARE RISK AT HOME:  Medicare Risk at Home Any stairs in or around the home?: Yes If so, are there any without handrails?: No Home free of loose throw rugs in walkways, pet beds, electrical cords, etc?: Yes Adequate lighting in your home to reduce risk of falls?: Yes Life alert?: No Use of a cane, walker or w/c?: No Grab bars in the bathroom?: Yes Shower chair or bench in shower?: Yes Elevated toilet seat or a handicapped toilet?: Yes  TIMED UP AND GO:  Was the test performed?  Yes  Length of time to ambulate 10 feet: 5 sec Gait steady and fast without use of assistive device  Cognitive Function: 6CIT completed    01/14/2022    9:13 AM 01/13/2021    9:17 AM  MMSE - Mini Mental State Exam  Not  completed: Unable to complete Unable to complete        12/19/2023    1:50 PM 01/14/2022    9:14 AM 01/13/2021    9:17 AM  6CIT Screen  What Year? 0 points 0 points 0 points  What month? 0 points 0 points 0 points  What time? 0 points 0 points 0 points  Count back from 20 0 points 2 points 0 points  Months in reverse 0 points 2 points 0 points  Repeat phrase 0 points 0 points 2 points  Total Score 0 points 4 points 2 points    Immunizations Immunization History  Administered Date(s) Administered   DT (Pediatric) 02/17/2017   Fluad Trivalent(High Dose 65+) 02/13/2023   H1N1 03/22/2008   Influenza Whole 02/18/2006, 02/03/2007, 02/18/2008, 02/15/2010   Influenza,inj,Quad PF,6+ Mos 04/13/2013, 02/22/2014, 01/09/2016, 02/24/2018, 02/22/2019, 12/27/2021   Influenza-Unspecified 02/21/2017   PNEUMOCOCCAL CONJUGATE-20 02/27/2023   Pneumococcal Polysaccharide-23 02/18/2006, 08/08/2015   Td 06/12/2009   Tdap 08/08/2015, 02/21/2017   Zoster Recombinant(Shingrix) 02/19/2017, 05/20/2017   Zoster, Live 08/08/2015    Screening Tests Health Maintenance  Topic Date Due   COVID-19 Vaccine (1) Never done   Lung Cancer Screening  03/19/2016   DEXA SCAN  02/18/2020   INFLUENZA VACCINE  12/05/2023   MAMMOGRAM  04/09/2024   Medicare Annual Wellness (AWV)  12/17/2024   DTaP/Tdap/Td (5 - Td or Tdap) 02/22/2027   Colonoscopy  06/22/2033   Pneumococcal Vaccine: 50+ Years  Completed   Hepatitis C Screening  Completed   Zoster Vaccines- Shingrix  Completed   HPV VACCINES  Aged Out   Meningococcal B Vaccine  Aged Out   Pneumococcal Vaccine  Discontinued    Health Maintenance  Health Maintenance Due  Topic Date Due   COVID-19 Vaccine (1) Never done   Lung Cancer Screening  03/19/2016   DEXA SCAN  02/18/2020   INFLUENZA VACCINE  12/05/2023   Health Maintenance Items Addressed: Mammogram ordered, DEXA ordered, See Nurse Notes at the end of this note  Additional Screening:  Vision  Screening: Recommended annual ophthalmology exams for early detection of glaucoma and other disorders of the eye. Would you like a referral to an eye doctor? Patient provided with information [facility]) to call and make an appointment to see Dr. Oneil Platts. She is a former patient of his.   Dental Screening: Recommended annual dental exams for proper oral hygiene  Community Resource Referral / Chronic Care Management: CRR required this visit?  No   CCM required this visit?  No   Plan:    I have personally reviewed and noted the following in the patient's chart:   Medical and social history Use of alcohol, tobacco or illicit drugs  Current medications and supplements including opioid prescriptions. Patient is currently taking opioid prescriptions. Information provided to patient regarding non-opioid alternatives. Patient advised to discuss non-opioid treatment plan with their provider. Functional ability and status Nutritional status Physical activity Advanced directives List of other physicians Hospitalizations, surgeries, and ER visits in previous 12 months Vitals Screenings to include cognitive, depression, and falls Referrals and appointments  In addition, I have reviewed and discussed with patient certain preventive protocols, quality metrics, and best practice recommendations. A written personalized care plan for preventive services as well as general preventive health recommendations were provided to patient.   Tyreisha Ungar, CMA   12/19/2023   After Visit Summary: (MyChart) Due to this being a telephonic visit, the after visit summary with patients personalized plan was offered to patient via MyChart   Notes: Patient referred to endocrinology because she wants to discuss injections for osteoporosis, she previously was seen by Dr. Lenis and requested to go back to him. Referred to ENT, Dr. Karis due to bilateral hearing difficulty. All information provided to  patient.

## 2023-12-19 NOTE — Patient Instructions (Addendum)
 Ms. Bueche , Thank you for taking time out of your busy schedule to complete your Annual Wellness Visit with me. I enjoyed our conversation and look forward to speaking with you again next year. I, as well as your care team,  appreciate your ongoing commitment to your health goals. Please review the following plan we discussed and let me know if I can assist you in the future. Your Game plan/ To Do List    Referrals: If you haven't heard from the office you've been referred to, please reach out to them at the phone provided.   ENT: [facility]) Dr. Karis [address] [place] [address] Ph: [phone]   Osteoporosis Screening/Yearly Mammogram: Please call the number below to schedule your appt. [facility] Phone: [phone]  [facility] Dr. Barbette office [address] [place] Phone: [phone]   Eye Doctor: (Dr. Oneil Platts) [facility] [address] [place] [address] Ph [phone]   Follow up Visits: We will see or speak with you next year for your Next Medicare AWV with our clinical staff  Clinician Recommendations:  Aim for 30 minutes of exercise or brisk walking, 6-8 glasses of water , and 5 servings of fruits and vegetables each day.    Wishing you many blessings and good health during the next year until our next visit.  -Eyla Tallon   This is a list of the screenings recommended for you:  Health Maintenance  Topic Date Due   COVID-19 Vaccine (1) Never done   Screening for Lung Cancer  03/19/2016   DEXA scan (bone density measurement)  02/18/2020   Flu Shot  12/05/2023   Mammogram  04/09/2024   Medicare Annual Wellness Visit  12/17/2024   DTaP/Tdap/Td vaccine (5 - Td or Tdap) 02/22/2027   Colon Cancer Screening  06/22/2033   Pneumococcal Vaccine for age over 1  Completed   Hepatitis C Screening  Completed   Zoster (Shingles) Vaccine   Completed   HPV Vaccine  Aged Out   Meningitis B Vaccine  Aged Out   Pneumococcal Vaccine  Discontinued    Advanced directives: (Provided) Advance directive discussed with you today. I have provided a copy for you to complete at home and have notarized. Once this is complete, please bring a copy in to our office so we can scan it into your chart.  Advance Care Planning is important because it:  [x]  Makes sure you receive the medical care that is consistent with your values, goals, and preferences  [x]  It provides guidance to your family and loved ones and reduces their decisional burden about whether or not they are making the right decisions based on your wishes.  Follow the link provided in your after visit summary or read over the paperwork we have mailed to you to help you started getting your Advance Directives in place. If you need assistance in completing these, please reach out to us  so that we can help you!  See attachments for Preventive Care and Fall Prevention Tips.

## 2023-12-22 DIAGNOSIS — R7989 Other specified abnormal findings of blood chemistry: Secondary | ICD-10-CM | POA: Diagnosis not present

## 2023-12-23 LAB — HEPATIC FUNCTION PANEL
AG Ratio: 1.7 (calc) (ref 1.0–2.5)
ALT: 20 U/L (ref 6–29)
AST: 27 U/L (ref 10–35)
Albumin: 4 g/dL (ref 3.6–5.1)
Alkaline phosphatase (APISO): 144 U/L (ref 37–153)
Bilirubin, Direct: 0.1 mg/dL (ref 0.0–0.2)
Globulin: 2.4 g/dL (ref 1.9–3.7)
Indirect Bilirubin: 0.3 mg/dL (ref 0.2–1.2)
Total Bilirubin: 0.4 mg/dL (ref 0.2–1.2)
Total Protein: 6.4 g/dL (ref 6.1–8.1)

## 2023-12-24 ENCOUNTER — Other Ambulatory Visit: Payer: Self-pay | Admitting: Internal Medicine

## 2023-12-24 ENCOUNTER — Ambulatory Visit (INDEPENDENT_AMBULATORY_CARE_PROVIDER_SITE_OTHER)

## 2023-12-24 VITALS — BP 133/87 | HR 96 | Ht 60.0 in | Wt 95.0 lb

## 2023-12-24 DIAGNOSIS — I10 Essential (primary) hypertension: Secondary | ICD-10-CM

## 2023-12-24 DIAGNOSIS — M898X1 Other specified disorders of bone, shoulder: Secondary | ICD-10-CM

## 2023-12-24 DIAGNOSIS — M150 Primary generalized (osteo)arthritis: Secondary | ICD-10-CM | POA: Diagnosis not present

## 2023-12-24 DIAGNOSIS — E538 Deficiency of other specified B group vitamins: Secondary | ICD-10-CM

## 2023-12-24 DIAGNOSIS — M25472 Effusion, left ankle: Secondary | ICD-10-CM

## 2023-12-24 MED ORDER — CYANOCOBALAMIN 1000 MCG/ML IJ SOLN
1000.0000 ug | INTRAMUSCULAR | Status: AC
  Administered 2023-12-24: 1000 ug via INTRAMUSCULAR

## 2023-12-24 NOTE — Progress Notes (Signed)
 Established Patient Office Visit  Subjective   Patient ID: [name] [name], female    DOB: 1955/05/11  Age: 68 y.o. MRN: [id number]  Chief Complaint  Patient presents with   Hospitalization Follow-up    Er follow up  Patient was seen at [facility] for the following issues:   Patient presents with: Weakness, Numbness, and Tingling (Left arm)    [name] [name] is a 68 y.o. female.  She has PMH of anxiety, Crohn's disease, GERD, alcohol abuse. Pt complains of waxing and waning left facial arm and leg numbness and tingling with some weakness for several weeks.  She is yesterday was worse but today the symptoms returned again around 10 AM.  She called her PCPs office and they advise she come in for stroke workup.  She states she has good days and bad days some days are worse than others.  She called her PCP today because the symptoms were not going away.  She also gets headache in association with this.   Reports she had a neck injury about a month and a half ago in association with a seizure and was admitted to the hospital.  She thinks that her numbness and tingling could be coming from neck injury.   Patient reports some intermittent blurry vision right greater than left that she states is when she takes higher doses of gabapentin  but this has been an ongoing issue, denies visual field changes or vision loss.  HPI  ROS    Objective:     BP 133/87   Pulse 96   Ht 5' (1.524 m)   Wt 95 lb (43.1 kg)   SpO2 93%   BMI 18.55 kg/m  BP Readings from Last 3 Encounters:  12/25/23 129/80  12/24/23 133/87  12/18/23 (!) 136/94   Wt Readings from Last 3 Encounters:  12/25/23 96 lb 3.2 oz (43.6 kg)  12/24/23 95 lb (43.1 kg)  12/18/23 95 lb (43.1 kg)     Physical Exam Vitals and nursing note reviewed.  Constitutional:      Appearance: Normal appearance.  HENT:     Head: Normocephalic.  Eyes:     Extraocular Movements: Extraocular movements intact.     Pupils: Pupils are equal, round,  and reactive to light.  Cardiovascular:     Rate and Rhythm: Normal rate and regular rhythm.  Pulmonary:     Effort: Pulmonary effort is normal.     Breath sounds: Normal breath sounds.  Chest:     Chest wall: Deformity and tenderness present.    Musculoskeletal:     Cervical back: Normal range of motion and neck supple.  Neurological:     Mental Status: She is alert and oriented to person, place, and time.  Psychiatric:        Mood and Affect: Mood normal.        Thought Content: Thought content normal.      No results found for any visits on 12/24/23.    The ASCVD Risk score (Arnett DK, et al., 2019) failed to calculate for the following reasons:   The valid HDL cholesterol range is 20 to 100 mg/dL    Assessment & Plan:   Problem List Items Addressed This Visit       Cardiovascular and Mediastinum   Essential hypertension   Hypertension is well-controlled with current management.          Musculoskeletal and Integument   Osteoarthritis   I think this is arthritis in  her hands she has pain medication as well as topical Aspercreme.      Other Visit Diagnoses       B12 deficiency    -  Primary   Relevant Medications   cyanocobalamin  (VITAMIN B12) injection 1,000 mcg     Clavicle pain          Assessment and Plan    Vitamin B12 deficiency Vitamin B12 deficiency likely due to malabsorption related to Crohn's disease. - Administer B12 injection today. - Recommend switching to sublingual B12 supplement for better absorption.  Crohn's disease Crohn's disease with bowel resection history, presenting with alternating constipation and diarrhea. - Follow up with GI specialist as scheduled.  Cervical spine scoliosis with disc bulges Cervical spine scoliosis with disc bulges contributing to neck pain, possibly related to dizziness and headaches. No acute neurological deficits. - Provide MRI report to her.  Dizziness and headaches, possible trigeminal  neuralgia Dizziness and headaches possibly related to trigeminal neuralgia, with severe pain in the trigeminal nerve distribution, exacerbated by neck issues.  Hand pain, likely secondary to arthritis Chronic hand pain likely due to arthritis. She is reluctant to undergo surgical intervention.  Clavicular pain Chronic clavicular pain with no acute changes and no serious underlying pathology.  Tinnitus Chronic tinnitus with no effective treatment available. - Discuss management options, including sound therapy and hearing aids.  Hypertension Hypertension is well-controlled with current management.  General Health Maintenance Discussed the importance of vitamin supplementation due to malabsorption issues related to Crohn's disease.        No follow-ups on file.    [name] Longs, FNP

## 2023-12-25 ENCOUNTER — Ambulatory Visit (INDEPENDENT_AMBULATORY_CARE_PROVIDER_SITE_OTHER): Admitting: Internal Medicine

## 2023-12-25 VITALS — BP 129/80 | HR 104 | Temp 98.6°F | Ht 60.0 in | Wt 96.2 lb

## 2023-12-25 DIAGNOSIS — K219 Gastro-esophageal reflux disease without esophagitis: Secondary | ICD-10-CM

## 2023-12-25 DIAGNOSIS — R1310 Dysphagia, unspecified: Secondary | ICD-10-CM

## 2023-12-25 DIAGNOSIS — T402X5D Adverse effect of other opioids, subsequent encounter: Secondary | ICD-10-CM | POA: Diagnosis not present

## 2023-12-25 DIAGNOSIS — K5903 Drug induced constipation: Secondary | ICD-10-CM | POA: Diagnosis not present

## 2023-12-25 DIAGNOSIS — K760 Fatty (change of) liver, not elsewhere classified: Secondary | ICD-10-CM

## 2023-12-25 DIAGNOSIS — K5080 Crohn's disease of both small and large intestine without complications: Secondary | ICD-10-CM

## 2023-12-25 DIAGNOSIS — R1319 Other dysphagia: Secondary | ICD-10-CM

## 2023-12-25 DIAGNOSIS — K50019 Crohn's disease of small intestine with unspecified complications: Secondary | ICD-10-CM

## 2023-12-25 MED ORDER — DEXLANSOPRAZOLE 60 MG PO CPDR: 60.0000 mg | DELAYED_RELEASE_CAPSULE | Freq: Every day | ORAL | 3 refills | Status: DC

## 2023-12-25 NOTE — Progress Notes (Signed)
 Referring Provider: Bevely Doffing, FNP Primary Care Physician:  Bevely Doffing, FNP Primary GI:  Dr. Cindie  Chief Complaint  Patient presents with   Follow-up    Follow up on constipation---pt still having to push having a BM    HPI:   [name] [name] is a 68 y.o. female who presents to clinic today for follow-up visit.  Has not been seen since August 2022 (did not follow up).  Crohn's disease: History of Crohn's disease s/p ileocecectomy 2006 with surgical remission. Not chronically on any management for this.  Colonoscopy Feb 2025: non-bleeding internal hemorrhoids, normal colon, patent anastomosis, 5 year surveillance due to history of adenomas.   CT abdomen pelvis with contrast 12/08/2020: Hepatic steatosis, small hiatal hernia with distal esophageal wall thickening otherwise unremarkable.  Colonoscopy 04/21/2018 external/internal hemorrhoids, sigmoid diverticulosis, 2 polyps removed (tubular adenomas on pathology)  Patient denies any blood in her stool or mucus in her stool.    Chronic GERD, esophageal dysphagia: Improved on Dexlansoprazole  60 mg daily.  Dysphagia resolved.   Feb 2025: EGD with findings of small hiatal hernia, Schatzki ring s/p dilation, LA Grade A reflux esophagitis, gastritis (negative H.pylori) and non-bleeding gastric ulcer   May 2025 EGD for PUD surveillance: small hiatal hernia, mild schatzki ring, heale esophagitis, healed prior area of PUD, gastritis s/p biopsy. Path: negative H.pylori.   Hepatic steatosis, abnormal LFTS: Blood work 12/22/23 with AST 27, ALT 20, T. bili 0.4, alk phos 144.    RUQ US  with elastography 06/06/23 R hepatic cyst, 0.3 cm GB polyp, median kPa 5.3.  History of chronic alcohol use.  States she has decreased significantly in this regard.  Constipation: likely medication induced from buprenorphine. On previous visit was recommended Miralax  purge followed by Linzess  290 mcg daily. States her symptoms are somewhat improved.      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   Crohn disease (HCC)    CTS (carpal tunnel syndrome)    Diarrhea MULITIFACTORIAL   IBS, LACTOSE INTOLERANCE, SBBO, BILE-SALT   Elevated liver enzymes 2009: BMI 24 ?Etoh ALT 94, AST 51,  NEG ANA, qIGs& ASMA   MAY 2012 AST 52 ALT 38   Esophageal stricture 2009   GERD (gastroesophageal reflux disease)    Hemorrhoid    Hyperlipemia    Hypertension    Hypothyroidism 12/13/2020   Inflammatory bowel disease 2006 CD   SURGICAL REMISSION   LBP (low back pain)    Mental disorder    Migraine    Osteoporosis    PONV (postoperative nausea and vomiting)    Shortness of breath    exertion and humidity   Shoulder pain    right   Sleep apnea    Stop Bang Cock score of 4    Past Surgical History:  Procedure Laterality Date   BACK SURGERY     BACK SURGERY  07/16/2018   BILATERAL SALPINGOOPHORECTOMY     BIOPSY  12/24/2011   Procedure: BIOPSY;  Surgeon: Margo LITTIE Haddock, MD;  Location: [facility];  Service: Endoscopy;;  Gastric Biopsies   BIOPSY N/A 06/30/2012   Procedure: BIOPSY;  Surgeon: Margo LITTIE Haddock, MD;  Location: [facility];  Service: Endoscopy;  Laterality: N/A;  Gastric and Esophageal Biopsies  BIOPSY  06/23/2023   Procedure: BIOPSY;  Surgeon: Cindie Carlin POUR, DO;  Location: [facility];  Service: Endoscopy;;   CARPAL TUNNEL RELEASE  LEFT   CATARACT EXTRACTION W/PHACO Right 07/30/2016   Procedure: CATARACT EXTRACTION PHACO AND INTRAOCULAR LENS PLACEMENT (IOC);  Surgeon: Oneil Platts, MD;  Location: [facility];  Service: Ophthalmology;  Laterality: Right;  CDE: 5.45   CATARACT EXTRACTION W/PHACO Left 08/13/2016   Procedure: CATARACT EXTRACTION PHACO AND INTRAOCULAR LENS PLACEMENT (IOC);  Surgeon: Oneil Platts, MD;  Location: [facility];  Service: Ophthalmology;   Laterality: Left;  CDE: 5.59   COLON SURGERY     COLONOSCOPY  JAN 2009 DIARRHEA, ABD PAIN, BRBPR   ANASTOMOTIC ULCER(3MM), IH Ak:AZWPHW ULCER, NL COLON bX   COLONOSCOPY WITH PROPOFOL  N/A 04/21/2018   examined portion of the ileum normal, two 3-5 mm polyps, diverticulosis in the rectosigmoid and sigmoid colon, external and internal hemorrhoids, significant looping of the colon.  Pathology with tubular adenomas.  Recommendations to follow high-fiber diet and repeat colonoscopy in 5 to 10 years with peds colonoscope.   COLONOSCOPY WITH PROPOFOL  N/A 06/23/2023   Procedure: COLONOSCOPY WITH PROPOFOL ;  Surgeon: Cindie Carlin POUR, DO;  Location: [facility];  Service: Endoscopy;  Laterality: N/A;  10:00 am, asa 3   DILATION AND CURETTAGE OF UTERUS     ESOPHAGEAL DILATION  12/24/2011   SLF: A stricture was found in the distal esophagus/ Moderate gastritis/ MILD Duodenitis   ESOPHAGEAL DILATION N/A 06/23/2023   Procedure: ESOPHAGEAL DILATION;  Surgeon: Cindie Carlin POUR, DO;  Location: [facility];  Service: Endoscopy;  Laterality: N/A;   ESOPHAGOGASTRODUODENOSCOPY  02/07/09   mild gastritis   ESOPHAGOGASTRODUODENOSCOPY N/A 09/11/2023   Procedure: EGD (ESOPHAGOGASTRODUODENOSCOPY);  Surgeon: Cindie Carlin POUR, DO;  Location: [facility];  Service: Endoscopy;  Laterality: N/A;  8:15 AM, ASA 3   ESOPHAGOGASTRODUODENOSCOPY (EGD) WITH PROPOFOL  N/A 06/30/2012   SLF: 1. No definite stricture appreciated 2. small hiatal hernia 3. Gastritis   ESOPHAGOGASTRODUODENOSCOPY (EGD) WITH PROPOFOL  N/A 02/20/2016   Procedure: ESOPHAGOGASTRODUODENOSCOPY (EGD) WITH PROPOFOL ;  Surgeon: Margo LITTIE Haddock, MD;  Location: [facility];  Service: Endoscopy;  Laterality: N/A;  930   ESOPHAGOGASTRODUODENOSCOPY (EGD) WITH PROPOFOL  N/A 11/24/2018   benign-appearing esophageal peptic stricture s/p dilation, small hiatal hernia, moderate erosive gastritis.    ESOPHAGOGASTRODUODENOSCOPY (EGD) WITH PROPOFOL  N/A 06/23/2023   Procedure:  ESOPHAGOGASTRODUODENOSCOPY (EGD) WITH PROPOFOL ;  Surgeon: Cindie Carlin POUR, DO;  Location: [facility];  Service: Endoscopy;  Laterality: N/A;   EYE SURGERY     gum flap Bilateral    HEMICOLECTOMY  RIGHT 2006   HERNIA REPAIR     LUMBAR DISC SURGERY     POLYPECTOMY  04/21/2018   Procedure: POLYPECTOMY;  Surgeon: Haddock Margo LITTIE, MD;  Location: [facility];  Service: Endoscopy;;  (colon)   SAVORY DILATION N/A 06/30/2012   Procedure: SAVORY DILATION;  Surgeon: Margo LITTIE Haddock, MD;  Location: [facility];  Service: Endoscopy;  Laterality: N/A;  12.8mm , 14mm, 15mm   SAVORY DILATION N/A 02/20/2016   Procedure: SAVORY DILATION;  Surgeon: Margo LITTIE Haddock, MD;  Location: [facility];  Service: Endoscopy;  Laterality: N/A;   SAVORY DILATION N/A 11/24/2018   Procedure: SAVORY DILATION;  Surgeon: Haddock Margo LITTIE, MD;  Location: [facility];  Service: Endoscopy;  Laterality: N/A;   UPPER GASTROINTESTINAL ENDOSCOPY  JAN 2009 ABD PAIN   GASTRITIS, ESO RING   UPPER GASTROINTESTINAL ENDOSCOPY  OCT 2010 ABD PAIN, DYSPHAGIA  DIL , GASTRITIS, NL DUODENUM   UPPER GASTROINTESTINAL ENDOSCOPY  OCT 2010 DYSPHAGIA   DIL 17 MM, GASTRITIS, NL DUODENUM   vocal cord surgery  Sep 20, 2010   precancerous areas removed   wisdom tooth extraction Right    pin placement due to broken jaw    Current Outpatient Medications  Medication Sig Dispense Refill   benzonatate  (TESSALON ) 100 MG capsule Take 1 capsule (100 mg total) by mouth 2 (two) times daily as needed for cough. 30 capsule 2   Buprenorphine HCl-Naloxone HCl 4-1 MG FILM Place 1 Film under the tongue 4 (four) times daily.     calcium  carbonate (TUMS - DOSED IN MG ELEMENTAL CALCIUM ) 500 MG chewable tablet Chew 1 tablet (200 mg of elemental calcium  total) by mouth 3 (three) times daily with meals. 90 tablet 6   clotrimazole  (CLOTRIMAZOLE  AF) 1 % cream Apply 1 application topically 2 (two) times daily. To affected area for 1-2 weeks 30 g 0   cyclobenzaprine   (FLEXERIL ) 5 MG tablet Take 1 tablet (5 mg total) by mouth 3 (three) times daily as needed for muscle spasms. 60 tablet 2   dexlansoprazole  (DEXILANT ) 60 MG capsule Take 1 capsule (60 mg total) by mouth daily. 30 capsule 11   folic acid  (FOLVITE ) 1 MG tablet Take 1 tablet (1 mg total) by mouth daily. 30 tablet 5   gabapentin  (NEURONTIN ) 300 MG capsule Take 1 capsule (300 mg total) by mouth 3 (three) times daily. 90 capsule 3   lidocaine  (LIDODERM ) 5 % Place 1 patch onto the skin daily.     linaclotide  (LINZESS ) 290 MCG CAPS capsule Take 1 capsule (290 mcg total) by mouth daily before breakfast. 90 capsule 3   magnesium  hydroxide (MILK OF MAGNESIA) 400 MG/5ML suspension Take 30 mLs by mouth daily as needed for mild constipation.     Multiple Vitamin (MULTIVITAMIN WITH MINERALS) TABS tablet Take 1 tablet by mouth daily.     nystatin  (MYCOSTATIN ) 100000 UNIT/ML suspension TAKE 5 MLS BY MOUTH 4 TIMES DAILY AS NEEDED. 500 mL 0   SYMBICORT  80-4.5 MCG/ACT inhaler Inhale 2 puffs by mouth twice daily 33 g 0   thiamine  (VITAMIN B-1) 100 MG tablet Take 1 tablet (100 mg total) by mouth daily. 30 tablet 5   triamcinolone  (KENALOG ) 0.1 % Apply 1 application topically 2 (two) times daily. 30 g 0   VENTOLIN  HFA 108 (90 Base) MCG/ACT inhaler INHALE 2 PUFFS BY MOUTH EVERY 6 HOURS AS NEEDED FOR WHEEZING OR SHORTNESS OF BREATH 18 g 0   Current Facility-Administered Medications  Medication Dose Route Frequency Provider Last Rate Last Admin   cyanocobalamin  (VITAMIN B12) injection 1,000 mcg  1,000 mcg Intramuscular Q30 days    1,000 mcg at 12/24/23 1549    Allergies as of 12/25/2023 - Review Complete 12/25/2023  Allergen Reaction Noted   Naproxen Shortness Of Breath 01/09/2016   Dicyclomine  hcl Other (See Comments) 01/24/2021   Duloxetine Other (See Comments) 01/24/2021   Lovastatin Other (See Comments) 09/25/2010   Toradol  [ketorolac  tromethamine ] Other (See Comments) 02/20/2016   Tramadol Other (See Comments)  02/20/2016   Nexium  [esomeprazole  magnesium ] Diarrhea 06/12/2012    Family History  Problem Relation Age of Onset   Hypothyroidism Mother    Heart disease Father    Parkinson's disease Father    Hypothyroidism Daughter    Heart attack Paternal Grandfather    Alzheimer's disease Paternal Grandmother    Heart attack Maternal Grandfather    Crohn's disease Sister  Other Sister        was murdered   Crohn's disease Maternal Uncle    Colon cancer Neg Hx    Colon polyps Neg Hx     Social History   Socioeconomic History   Marital status: Married    Spouse name: Lemond   Number of children: 2   Years of education: GED   Highest education level: Not on file  Occupational History    Comment: disabled  Tobacco Use   Smoking status: Every Day    Current packs/day: 0.50    Average packs/day: 0.5 packs/day for 48.6 years (24.3 ttl pk-yrs)    Types: Cigarettes    Start date: 1977   Smokeless tobacco: Never  Vaping Use   Vaping status: Never Used  Substance and Sexual Activity   Alcohol use: Not Currently    Comment: wine/beer a few times a week    Drug use: No   Sexual activity: Not Currently    Birth control/protection: Post-menopausal  Other Topics Concern   Not on file  Social History Narrative   Lives with husband   no caffiene   Social Drivers of Health   Financial Resource Strain: Low Risk  (12/19/2023)   Overall Financial Resource Strain (CARDIA)    Difficulty of Paying Living Expenses: Not hard at all  Food Insecurity: No Food Insecurity (12/19/2023)   Hunger Vital Sign    Worried About Running Out of Food in the Last Year: Never true    Ran Out of Food in the Last Year: Never true  Transportation Needs: No Transportation Needs (12/19/2023)   PRAPARE - Administrator, Civil Service (Medical): No    Lack of Transportation (Non-Medical): No  Physical Activity: Inactive (12/19/2023)   Exercise Vital Sign    Days of Exercise per Week: 0 days    Minutes  of Exercise per Session: 0 min  Stress: No Stress Concern Present (12/19/2023)   [employer] of Occupational Health - Occupational Stress Questionnaire    Feeling of Stress: Not at all  Social Connections: Moderately Isolated (12/19/2023)   Social Connection and Isolation Panel    Frequency of Communication with Friends and Family: Three times a week    Frequency of Social Gatherings with Friends and Family: Once a week    Attends Religious Services: Never    Database administrator or Organizations: No    Attends Banker Meetings: Never    Marital Status: Married    Subjective: Review of Systems  Constitutional:  Negative for chills and fever.  HENT:  Negative for congestion and hearing loss.   Eyes:  Negative for blurred vision and double vision.  Respiratory:  Negative for cough and shortness of breath.   Cardiovascular:  Negative for chest pain and palpitations.  Gastrointestinal:  Positive for abdominal pain and heartburn. Negative for blood in stool, constipation, diarrhea, melena and vomiting.       Dysphagia  Genitourinary:  Negative for dysuria and urgency.  Musculoskeletal:  Negative for joint pain and myalgias.  Skin:  Negative for itching and rash.  Neurological:  Negative for dizziness and headaches.  Psychiatric/Behavioral:  Negative for depression. The patient is not nervous/anxious.      Objective: BP 129/80   Pulse (!) 104   Temp 98.6 F (37 C)   Ht 5' (1.524 m)   Wt 96 lb 3.2 oz (43.6 kg)   BMI 18.79 kg/m  Physical Exam Constitutional:  Appearance: Normal appearance.  HENT:     Head: Normocephalic and atraumatic.  Eyes:     Extraocular Movements: Extraocular movements intact.     Conjunctiva/sclera: Conjunctivae normal.  Cardiovascular:     Rate and Rhythm: Normal rate and regular rhythm.  Pulmonary:     Effort: Pulmonary effort is normal.     Breath sounds: Normal breath sounds.  Abdominal:     General: Bowel sounds are  normal.     Palpations: Abdomen is soft.  Musculoskeletal:        General: No swelling. Normal range of motion.     Cervical back: Normal range of motion and neck supple.  Skin:    General: Skin is warm and dry.     Coloration: Skin is not jaundiced.  Neurological:     General: No focal deficit present.     Mental Status: She is alert and oriented to person, place, and time.  Psychiatric:        Mood and Affect: Mood normal.        Behavior: Behavior normal.      Assessment/Plan:  1.  Crohn's disease-s/p ileocecectomy 2006 with surgical remission. Not chronically on any management for this.  Updated colonoscopy as above.  Continue to monitor.  2.  Chronic GERD, esophageal dysphagia -symptoms improved on dexlansoprazole  60 mg daily. Will continue.  Refill today.  3.  Hepatic steatosis, abnormal LFTs-likely MASH, alcohol likely also playing a role.  Most recent LFTs normalized.  Continue to limit alcohol use.    RUQ US  with elastography 06/06/23 R hepatic cyst, 0.3 cm GB polyp, median kPa 5.3.  4.  Opioid-induced constipation-symptoms improved status post MiraLAX  purge and chronic therapy with Linzess  290 mcg daily.  Will continue.  Follow-up in 3 to 4 months.   12/25/2023 10:51 AM   Disclaimer: This note was dictated with voice recognition software. Similar sounding words can inadvertently be transcribed and may not be corrected upon review.

## 2023-12-25 NOTE — Patient Instructions (Signed)
 Continue on dexlansoprazole  60 mg daily for your chronic GERD.  I refilled this for you today and changed to 90-day prescription.  Continue on Linzess  290 mcg daily for your constipation.  Follow-up in 3 months.  It is always a pleasure seeing both you.  Dr. Cindie

## 2023-12-28 ENCOUNTER — Ambulatory Visit: Payer: Self-pay

## 2023-12-28 NOTE — Assessment & Plan Note (Signed)
 Hypertension is well-controlled with current management.

## 2023-12-28 NOTE — Assessment & Plan Note (Addendum)
 I think this is arthritis in her hands she has pain medication as well as topical Aspercreme.

## 2024-01-01 ENCOUNTER — Encounter: Payer: Self-pay | Admitting: Internal Medicine

## 2024-01-01 DIAGNOSIS — M79672 Pain in left foot: Secondary | ICD-10-CM | POA: Diagnosis not present

## 2024-01-01 DIAGNOSIS — L600 Ingrowing nail: Secondary | ICD-10-CM | POA: Diagnosis not present

## 2024-01-01 DIAGNOSIS — M79675 Pain in left toe(s): Secondary | ICD-10-CM | POA: Diagnosis not present

## 2024-01-13 ENCOUNTER — Ambulatory Visit: Payer: Self-pay | Admitting: Gastroenterology

## 2024-01-13 DIAGNOSIS — M549 Dorsalgia, unspecified: Secondary | ICD-10-CM | POA: Diagnosis not present

## 2024-01-13 DIAGNOSIS — Z79899 Other long term (current) drug therapy: Secondary | ICD-10-CM | POA: Diagnosis not present

## 2024-01-13 DIAGNOSIS — M4326 Fusion of spine, lumbar region: Secondary | ICD-10-CM | POA: Diagnosis not present

## 2024-01-13 DIAGNOSIS — M5442 Lumbago with sciatica, left side: Secondary | ICD-10-CM | POA: Diagnosis not present

## 2024-01-14 ENCOUNTER — Other Ambulatory Visit: Payer: Self-pay

## 2024-01-14 DIAGNOSIS — J410 Simple chronic bronchitis: Secondary | ICD-10-CM

## 2024-01-15 DIAGNOSIS — M79675 Pain in left toe(s): Secondary | ICD-10-CM | POA: Diagnosis not present

## 2024-01-15 DIAGNOSIS — L600 Ingrowing nail: Secondary | ICD-10-CM | POA: Diagnosis not present

## 2024-01-15 DIAGNOSIS — M79672 Pain in left foot: Secondary | ICD-10-CM | POA: Diagnosis not present

## 2024-01-16 ENCOUNTER — Ambulatory Visit (INDEPENDENT_AMBULATORY_CARE_PROVIDER_SITE_OTHER): Admitting: "Endocrinology

## 2024-01-16 ENCOUNTER — Encounter: Payer: Self-pay | Admitting: "Endocrinology

## 2024-01-16 VITALS — BP 106/68 | HR 96 | Ht 60.0 in | Wt 96.4 lb

## 2024-01-16 DIAGNOSIS — F17200 Nicotine dependence, unspecified, uncomplicated: Secondary | ICD-10-CM

## 2024-01-16 DIAGNOSIS — M810 Age-related osteoporosis without current pathological fracture: Secondary | ICD-10-CM

## 2024-01-16 NOTE — Progress Notes (Signed)
 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   Crohn disease (HCC)    CTS (carpal tunnel syndrome)    Diarrhea MULITIFACTORIAL   IBS, LACTOSE INTOLERANCE, SBBO, BILE-SALT   Elevated liver enzymes 2009: BMI 24 ?Etoh ALT 94, AST 51,  NEG ANA, qIGs& ASMA   MAY 2012 AST 52 ALT 38   Esophageal stricture 2009   GERD (gastroesophageal reflux disease)    Hemorrhoid    Hyperlipemia    Hypertension    Hypothyroidism 12/13/2020   Inflammatory bowel disease 2006 CD   SURGICAL REMISSION   LBP (low back pain)    Mental disorder    Migraine    Osteoporosis    PONV (postoperative nausea and vomiting)    Shortness of breath    exertion and humidity   Shoulder pain    right   Sleep apnea    Stop Bang Cock score of 4   Past Surgical History:  Procedure Laterality Date   BACK SURGERY     BACK SURGERY  07/16/2018   BILATERAL SALPINGOOPHORECTOMY     BIOPSY  12/24/2011   Procedure: BIOPSY;  Surgeon: Margo LITTIE Haddock, MD;  Location: [facility];  Service: Endoscopy;;  Gastric Biopsies   BIOPSY N/A 06/30/2012   Procedure: BIOPSY;  Surgeon: Margo LITTIE Haddock, MD;  Location: [facility];  Service: Endoscopy;  Laterality: N/A;  Gastric and Esophageal Biopsies   BIOPSY  06/23/2023   Procedure: BIOPSY;  Surgeon: Cindie Carlin POUR, DO;  [facility];  Service: Endoscopy;;   CARPAL TUNNEL RELEASE  LEFT   CATARACT EXTRACTION W/PHACO Right 07/30/2016   Procedure: CATARACT EXTRACTION PHACO AND INTRAOCULAR LENS PLACEMENT (IOC);  Surgeon: Oneil Platts, MD;  Location: [facility];  Service: Ophthalmology;  Laterality: Right;  CDE: 5.45    CATARACT EXTRACTION W/PHACO Left 08/13/2016   Procedure: CATARACT EXTRACTION PHACO AND INTRAOCULAR LENS PLACEMENT (IOC);  Surgeon: Oneil Platts, MD;  Location: [facility];  Service: Ophthalmology;  Laterality: Left;  CDE: 5.59   COLON SURGERY     COLONOSCOPY  JAN 2009 DIARRHEA, ABD PAIN, BRBPR   ANASTOMOTIC ULCER(3MM), IH Ak:AZWPHW ULCER, NL COLON bX   COLONOSCOPY WITH PROPOFOL  N/A 04/21/2018   examined portion of the ileum normal, two 3-5 mm polyps, diverticulosis in the rectosigmoid and sigmoid colon, external and internal hemorrhoids, significant looping of the colon.  Pathology with tubular adenomas.  Recommendations to follow high-fiber diet and repeat colonoscopy in 5 to 10 years with peds colonoscope.   COLONOSCOPY WITH PROPOFOL  N/A 06/23/2023   Procedure: COLONOSCOPY WITH PROPOFOL ;  Surgeon: Cindie Carlin POUR, DO;  [facility];  Service: Endoscopy;  Laterality: N/A;  10:00 am, asa 3   DILATION AND CURETTAGE OF UTERUS     ESOPHAGEAL DILATION  12/24/2011   SLF: A stricture  was found in the distal esophagus/ Moderate gastritis/ MILD Duodenitis   ESOPHAGEAL DILATION N/A 06/23/2023   Procedure: ESOPHAGEAL DILATION;  Surgeon: Cindie Carlin POUR, DO;  [facility];  Service: Endoscopy;  Laterality: N/A;   ESOPHAGOGASTRODUODENOSCOPY  02/07/09   mild gastritis   ESOPHAGOGASTRODUODENOSCOPY N/A 09/11/2023   Procedure: EGD (ESOPHAGOGASTRODUODENOSCOPY);  Surgeon: Cindie Carlin POUR, DO;  [facility];  Service: Endoscopy;  Laterality: N/A;  8:15 AM, ASA 3   ESOPHAGOGASTRODUODENOSCOPY (EGD) WITH PROPOFOL  N/A 06/30/2012   SLF: 1. No definite stricture appreciated 2. small hiatal hernia 3. Gastritis   ESOPHAGOGASTRODUODENOSCOPY (EGD) WITH PROPOFOL  N/A 02/20/2016   Procedure: ESOPHAGOGASTRODUODENOSCOPY (EGD) WITH PROPOFOL ;  Surgeon: Margo LITTIE Haddock, MD;  [facility];  Service: Endoscopy;  Laterality: N/A;  930   ESOPHAGOGASTRODUODENOSCOPY (EGD) WITH PROPOFOL  N/A 11/24/2018    benign-appearing esophageal peptic stricture s/p dilation, small hiatal hernia, moderate erosive gastritis.    ESOPHAGOGASTRODUODENOSCOPY (EGD) WITH PROPOFOL  N/A 06/23/2023   Procedure: ESOPHAGOGASTRODUODENOSCOPY (EGD) WITH PROPOFOL ;  Surgeon: Cindie Carlin POUR, DO;  [facility];  Service: Endoscopy;  Laterality: N/A;   EYE SURGERY     gum flap Bilateral    HEMICOLECTOMY  RIGHT 2006   HERNIA REPAIR     LUMBAR DISC SURGERY     POLYPECTOMY  04/21/2018   Procedure: POLYPECTOMY;  Surgeon: Haddock Margo LITTIE, MD;  [facility];  Service: Endoscopy;;  (colon)   SAVORY DILATION N/A 06/30/2012   Procedure: SAVORY DILATION;  Surgeon: Margo LITTIE Haddock, MD;  Location: [facility];  Service: Endoscopy;  Laterality: N/A;  12.81mm , 14mm, 15mm   SAVORY DILATION N/A 02/20/2016   Procedure: SAVORY DILATION;  Surgeon: Margo LITTIE Haddock, MD;  [facility];  Service: Endoscopy;  Laterality: N/A;   SAVORY DILATION N/A 11/24/2018   Procedure: SAVORY DILATION;  Surgeon: Haddock Margo LITTIE, MD;  [facility];  Service: Endoscopy;  Laterality: N/A;   UPPER GASTROINTESTINAL ENDOSCOPY  JAN 2009 ABD PAIN   GASTRITIS, ESO RING   UPPER GASTROINTESTINAL ENDOSCOPY  OCT 2010 ABD PAIN, DYSPHAGIA   DIL , GASTRITIS, NL DUODENUM   UPPER GASTROINTESTINAL ENDOSCOPY  OCT 2010 DYSPHAGIA   DIL 17 MM, GASTRITIS, NL DUODENUM   vocal cord surgery  Sep 20, 2010   precancerous areas removed   wisdom tooth extraction Right    pin placement due to broken jaw   Social History   Socioeconomic History   Marital status: Married    Spouse name: Lemond   Number of children: 2   Years of education: GED   Highest education level: Not on file  Occupational History    Comment: disabled  Tobacco Use   Smoking status: Every Day    Current packs/day: 0.50    Average packs/day: 0.5 packs/day for 48.7 years (24.3 ttl pk-yrs)    Types: Cigarettes    Start date: 1977   Smokeless tobacco: Never  Vaping Use    Vaping status: Never Used  Substance and Sexual Activity   Alcohol use: Not Currently    Comment: wine/beer a few times a week    Drug use: No   Sexual activity: Not Currently    Birth control/protection: Post-menopausal  Other Topics Concern   Not on file  Social History Narrative   Lives with husband   no caffiene   Social Drivers of Health   Financial Resource Strain: Low Risk  (12/19/2023)   Overall Financial Resource Strain (CARDIA)  Difficulty of Paying Living Expenses: Not hard at all  Food Insecurity: No Food Insecurity (12/19/2023)   Hunger Vital Sign    Worried About Running Out of Food in the Last Year: Never true    Ran Out of Food in the Last Year: Never true  Transportation Needs: No Transportation Needs (12/19/2023)   PRAPARE - Administrator, Civil Service (Medical): No    Lack of Transportation (Non-Medical): No  Physical Activity: Inactive (12/19/2023)   Exercise Vital Sign    Days of Exercise per Week: 0 days    Minutes of Exercise per Session: 0 min  Stress: No Stress Concern Present (12/19/2023)   [employer] of Occupational Health - Occupational Stress Questionnaire    Feeling of Stress: Not at all  Social Connections: Moderately Isolated (12/19/2023)   Social Connection and Isolation Panel    Frequency of Communication with Friends and Family: Three times a week    Frequency of Social Gatherings with Friends and Family: Once a week    Attends Religious Services: Never    Database administrator or Organizations: No    Attends Engineer, structural: Never    Marital Status: Married   Family History  Problem Relation Age of Onset   Hypothyroidism Mother    Hypertension Father    Heart disease Father    Parkinson's disease Father    Crohn's disease Sister    Other Sister        was murdered   Crohn's disease Maternal Uncle    Heart attack Maternal Grandfather    Alzheimer's disease Paternal Grandmother    Heart attack  Paternal Grandfather    Hypothyroidism Daughter    Colon cancer Neg Hx    Colon polyps Neg Hx    Outpatient Encounter Medications as of 01/16/2024  Medication Sig   benzonatate  (TESSALON ) 100 MG capsule Take 1 capsule (100 mg total) by mouth 2 (two) times daily as needed for cough.   Buprenorphine HCl-Naloxone HCl 4-1 MG FILM Place 1 Film under the tongue 4 (four) times daily.   calcium  carbonate (TUMS - DOSED IN MG ELEMENTAL CALCIUM ) 500 MG chewable tablet Chew 1 tablet (200 mg of elemental calcium  total) by mouth 3 (three) times daily with meals.   clotrimazole  (CLOTRIMAZOLE  AF) 1 % cream Apply 1 application topically 2 (two) times daily. To affected area for 1-2 weeks   cyclobenzaprine  (FLEXERIL ) 5 MG tablet Take 1 tablet (5 mg total) by mouth 3 (three) times daily as needed for muscle spasms.   dexlansoprazole  (DEXILANT ) 60 MG capsule Take 1 capsule (60 mg total) by mouth daily.   folic acid  (FOLVITE ) 1 MG tablet Take 1 tablet (1 mg total) by mouth daily.   gabapentin  (NEURONTIN ) 300 MG capsule Take 1 capsule (300 mg total) by mouth 3 (three) times daily.   lidocaine  (LIDODERM ) 5 % Place 1 patch onto the skin daily.   linaclotide  (LINZESS ) 290 MCG CAPS capsule Take 1 capsule (290 mcg total) by mouth daily before breakfast.   magnesium  hydroxide (MILK OF MAGNESIA) 400 MG/5ML suspension Take 30 mLs by mouth daily as needed for mild constipation.   Multiple Vitamin (MULTIVITAMIN WITH MINERALS) TABS tablet Take 1 tablet by mouth daily.   nystatin  (MYCOSTATIN ) 100000 UNIT/ML suspension TAKE 5 MLS BY MOUTH 4 TIMES DAILY AS NEEDED.   SYMBICORT  80-4.5 MCG/ACT inhaler Inhale 2 puffs by mouth twice daily   thiamine  (VITAMIN B-1) 100 MG tablet Take 1 tablet (100 mg total)  by mouth daily.   triamcinolone  (KENALOG ) 0.1 % Apply 1 application topically 2 (two) times daily.   VENTOLIN  HFA 108 (90 Base) MCG/ACT inhaler INHALE 2 PUFFS BY MOUTH EVERY 6 HOURS AS NEEDED FOR WHEEZING FOR SHORTNESS OF BREATH    Facility-Administered Encounter Medications as of 01/16/2024  Medication   cyanocobalamin  (VITAMIN B12) injection 1,000 mcg   ALLERGIES: Allergies  Allergen Reactions   Naproxen Shortness Of Breath    Chest pain   Dicyclomine  Hcl Other (See Comments)    Vision changes   Duloxetine Other (See Comments)    Suicidal    Lovastatin Other (See Comments)    Chest pain   Toradol  [Ketorolac  Tromethamine ] Other (See Comments)    Headache. Non-effective   Tramadol Other (See Comments)    Headache. Non-effective.   Nexium  [Esomeprazole  Magnesium ] Diarrhea    Abdominal pain    VACCINATION STATUS: Immunization History  Administered Date(s) Administered   DT (Pediatric) 02/17/2017   Fluad Trivalent(High Dose 65+) 02/13/2023   H1N1 03/22/2008   Influenza Whole 02/18/2006, 02/03/2007, 02/18/2008, 02/15/2010   Influenza,inj,Quad PF,6+ Mos 04/13/2013, 02/22/2014, 01/09/2016, 02/24/2018, 02/22/2019, 12/27/2021   Influenza-Unspecified 02/21/2017   PNEUMOCOCCAL CONJUGATE-20 02/27/2023   Pneumococcal Polysaccharide-23 02/18/2006, 08/08/2015   Td 06/12/2009   Tdap 08/08/2015, 02/21/2017   Zoster Recombinant(Shingrix) 02/19/2017, 05/20/2017   Zoster, Live 08/08/2015    HPI [name] [name] is 68 y.o. female who presents today with a medical history as above. she is being seen in consultation for osteoporosis requested by Bevely Doffing, FNP.  Patient was previously seen in this clinic for osteoporosis between 2019 and 2020.  She was diagnosed with osteoporosis at the age of 52 years.  She was treated with various forms of osteoporosis medications including oral bisphosphonates, IV Reclast  x 2 and was switched to Prolia  in November 2019.  Her last dose was at the end of 2020.  She was getting her Prolia  injections at her primary care doctor's office, did not get any extension treatment after Prolia  was discontinued for unclear reasons when her primary care doctor moved away. Patient did not return  to this clinic for follow-up.  She did not get any further bone density since October 2019,  bone density was showing worsening of osteoporosis. She denies any interval fractures nor height loss.  She continues to smoke. She has multiple medical problems on the background which required high-dose steroid treatments over the years including chron's disease, COPD. - She wishes to be resumed on osteoporosis treatment.  She is always slight build, however presents with significant weight loss of unintentional 20 pounds since last visit.  She is on polypharmacy including bronchodilators related to her complex  medical history, not on steroids.   Review of Systems  Constitutional: + Progressive weight loss, + fatigue, no subjective hyperthermia, no subjective hypothermia Eyes: no blurry vision, no xerophthalmia ENT: no sore throat, no nodules palpated in throat, no dysphagia/odynophagia, no hoarseness Cardiovascular: no Chest Pain, + Shortness of Breath on exertion, no palpitations, no leg swelling Respiratory: + cough, + shortness of breath Gastrointestinal: no Nausea/Vomiting/Diarhhea Musculoskeletal: no muscle/joint aches Skin: no rashes Neurological: no tremors, no numbness, no tingling, no dizziness Psychiatric: no depression, no anxiety  Objective:       01/16/2024   10:03 AM 12/25/2023   10:23 AM 12/24/2023    2:14 PM  Vitals with BMI  Height 5' 0 5' 0 5' 0  Weight 96 lbs 6 oz 96 lbs 3 oz 95 lbs  BMI 18.83 18.79  18.55  Systolic 106 129 866  Diastolic 68 80 87  Pulse 96 104 96    BP 106/68   Pulse 96   Ht 5' (1.524 m)   Wt 96 lb 6.4 oz (43.7 kg)   BMI 18.83 kg/m   Wt Readings from Last 3 Encounters:  01/16/24 96 lb 6.4 oz (43.7 kg)  12/25/23 96 lb 3.2 oz (43.6 kg)  12/24/23 95 lb (43.1 kg)    Physical Exam  Constitutional:  Body mass index is 18.83 kg/m.,  not in acute distress,+ anxious state of mind Eyes: PERRLA, EOMI, no exophthalmos ENT: moist mucous membranes,  no gross thyromegaly, no gross cervical lymphadenopathy Cardiovascular: normal precordial activity, Regular Rate and Rhythm, no Murmur/Rubs/Gallops Respiratory:  + Labored breathing, diffuse wheezing on bilateral lung fields.  Gastrointestinal: abdomen soft, Non -tender, No distension, Bowel Sounds present, no gross organomegaly Musculoskeletal: no gross deformities, strength intact in all four extremities, no peripheral edema Skin: moist, warm, no rashes Neurological: no tremor with outstretched hands, Deep tendon reflexes normal in bilateral lower extremities.  CMP ( most recent) CMP     Component Value Date/Time   NA 138 12/16/2023 1450   NA 137 11/18/2023 0959   K 4.1 12/16/2023 1450   CL 104 12/16/2023 1450   CO2 23 12/16/2023 1450   GLUCOSE 80 12/16/2023 1450   BUN 9 12/16/2023 1450   BUN 8 11/18/2023 0959   CREATININE 0.57 12/16/2023 1450   CREATININE 0.53 07/02/2019 1134   CALCIUM  9.0 12/16/2023 1450   PROT 6.4 12/22/2023 0000   PROT 7.0 02/13/2023 1050   ALBUMIN 3.4 (L) 12/16/2023 1450   ALBUMIN 4.5 02/13/2023 1050   AST 27 12/22/2023 0000   ALT 20 12/22/2023 0000   ALKPHOS 154 (H) 12/16/2023 1450   BILITOT 0.4 12/22/2023 0000   BILITOT 0.5 02/13/2023 1050   EGFR 99 11/18/2023 0959   GFRNONAA >60 12/16/2023 1450   GFRNONAA 110 02/26/2019 1006     Lab Results  Component Value Date   HGBA1C 5.0 09/08/2023   HGBA1C 4.9 02/13/2023   HGBA1C 4.9 12/27/2021     Lipid Panel     Component Value Date/Time   CHOL 238 (H) 09/08/2023 1106   TRIG 63 09/08/2023 1106   HDL 142 09/08/2023 1106   CHOLHDL 1.7 09/08/2023 1106   CHOLHDL NOT CALCULATED 01/31/2020 0844   VLDL 15 01/31/2020 0844   LDLCALC 85 09/08/2023 1106   LDLCALC 93 02/26/2019 1006   LDLDIRECT 29 03/11/2011 1315   LABVLDL 11 09/08/2023 1106      Lab Results  Component Value Date   TSH 1.330 09/08/2023   TSH 1.600 02/13/2023   TSH 2.360 07/22/2022   TSH 1.790 12/27/2021   TSH 1.110 02/09/2021    FREET4 0.89 09/08/2023   FREET4 1.06 02/13/2023   FREET4 1.22 07/22/2022   FREET4 1.22 12/27/2021     Bone density from February 17, 2018  [facility] Spine L1-L3 02/17/2018 60.4 Osteoporosis -3.9 0.703 g/cm2 0.7% - [facility] Spine L1-L3 01/05/2016 58.3 Osteoporosis -3.9 0.698 g/cm2 3.4% - [facility] Spine L1-L3 09/09/2014 57.0 Osteoporosis -4.1 0.675 g/cm2 0.7% -    DualFemur Total Mean 02/17/2018 60.4 Osteoporosis -3.0 0.628 g/cm2 -1.3% - DualFemur Total Mean 01/05/2016 58.3 Osteoporosis -3.0 0.636 g/cm2 -2.5% -  ASSESSMENT: BMD as determined from [facility] L1-L3 is 0.703 g/cm2 with a T-Score of -3.9. This patient is considered osteoporotic according to World Health Organization [facility]) criteria. The scan quality is good. Compared with the prior study  on 01/05/2016, the BMD of the lumbar spine and total mean shows no statistically significant change. Compared with the prior study on 01/05/2016, the BMD of the total left femur shows a statistically significant decrease. L4 was excluded due to advanced degenerative changes. Patient is not a candidate for FRAX assessment due to diagnosis of osteoporosis.   Assessment & Plan:   1. Age-related osteoporosis without current pathological fracture (Primary)  - [name] [name]  is being seen at a kind request of Bevely Doffing, FNP. - I have reviewed her available  records and clinically evaluated the patient. - Based on these reviews, she has osteoporosis of multifactorial etiology including postmenopausal, chronic exposure to high-dose steroids, smoking with COPD. Previously treated with various medications including oral and IV bisphosphonates as well as at least 2 years of Prolia  treatment until she discontinued them 2020.  I had a long discussion with the patient about the need to resume treatment.  Based on her most recent bone density next option would be for her to stay on long-term treatment with Prolia . She does have favorable chemistry and renal  function. We will try to procure this medication for her to get it as soon as ready and every 6 months. She will have thyroid  function tests before her next visit.  The patient was counseled on the dangers of tobacco use, and was advised to quit.  Reviewed strategies to maximize success, including removing cigarettes and smoking materials from environment.  - she is advised to maintain close follow up with Bevely Doffing, FNP for primary care needs.   -Thank you for involving me in the care of this pleasant patient.  Time spent with the patient: 45  minutes spent in  counseling her about  osteoporosis and the rest in obtaining information about her symptoms, reviewing her previous labs/studies (including abstractions from other facilities),  evaluations, and treatments,  and developing a plan to confirm diagnosis and long term treatment based on the latest standards of care/guidelines; and documenting her care.  [name] [name] participated in the discussions, expressed understanding, and voiced agreement with the above plans.  All questions were answered to her satisfaction. she is encouraged to contact clinic should she have any questions or concerns prior to her return visit.  Follow up plan: Return in about 2 weeks (around 01/30/2024) for Labs Today- Non-Fasting Ok, DXA Scan B4 NV.   Ranny Earl, MD [facility] [address] [place], [place] [address] Phone: [phone]  Fax: [phone]     01/16/2024, 1:01 PM  This note was partially dictated with voice recognition software. Similar sounding words can be transcribed inadequately or may not  be corrected upon review.

## 2024-01-17 LAB — T3: T3, Total: 189 ng/dL — ABNORMAL HIGH (ref 71–180)

## 2024-01-17 LAB — TSH: TSH: 3.09 u[IU]/mL (ref 0.450–4.500)

## 2024-01-17 LAB — VITAMIN D 25 HYDROXY (VIT D DEFICIENCY, FRACTURES): Vit D, 25-Hydroxy: 30.2 ng/mL (ref 30.0–100.0)

## 2024-01-17 LAB — T4, FREE: Free T4: 1.14 ng/dL (ref 0.82–1.77)

## 2024-01-17 LAB — THYROGLOBULIN ANTIBODY: Thyroglobulin Antibody: <1 [IU]/mL (ref 0.0–0.9)

## 2024-01-17 LAB — THYROID PEROXIDASE ANTIBODY: Thyroperoxidase Ab SerPl-aCnc: 13 [IU]/mL (ref 0–34)

## 2024-01-22 ENCOUNTER — Other Ambulatory Visit (HOSPITAL_COMMUNITY)

## 2024-01-29 ENCOUNTER — Ambulatory Visit (HOSPITAL_COMMUNITY)
Admission: RE | Admit: 2024-01-29 | Discharge: 2024-01-29 | Disposition: A | Discharge status: Home / Self Care | Source: Ambulatory Visit | Attending: "Endocrinology | Admitting: "Endocrinology

## 2024-01-29 DIAGNOSIS — Z780 Asymptomatic menopausal state: Secondary | ICD-10-CM | POA: Diagnosis not present

## 2024-01-29 DIAGNOSIS — M810 Age-related osteoporosis without current pathological fracture: Secondary | ICD-10-CM | POA: Diagnosis not present

## 2024-02-03 ENCOUNTER — Other Ambulatory Visit: Payer: Self-pay

## 2024-02-03 DIAGNOSIS — M79641 Pain in right hand: Secondary | ICD-10-CM | POA: Diagnosis not present

## 2024-02-03 DIAGNOSIS — M542 Cervicalgia: Secondary | ICD-10-CM

## 2024-02-03 DIAGNOSIS — J410 Simple chronic bronchitis: Secondary | ICD-10-CM

## 2024-02-05 ENCOUNTER — Encounter: Payer: Self-pay | Admitting: "Endocrinology

## 2024-02-05 ENCOUNTER — Ambulatory Visit (INDEPENDENT_AMBULATORY_CARE_PROVIDER_SITE_OTHER): Admitting: "Endocrinology

## 2024-02-05 VITALS — BP 122/80 | HR 76 | Ht 60.0 in | Wt 99.6 lb

## 2024-02-05 DIAGNOSIS — L600 Ingrowing nail: Secondary | ICD-10-CM | POA: Diagnosis not present

## 2024-02-05 DIAGNOSIS — M810 Age-related osteoporosis without current pathological fracture: Secondary | ICD-10-CM | POA: Diagnosis not present

## 2024-02-05 DIAGNOSIS — M79672 Pain in left foot: Secondary | ICD-10-CM | POA: Diagnosis not present

## 2024-02-05 DIAGNOSIS — M79675 Pain in left toe(s): Secondary | ICD-10-CM | POA: Diagnosis not present

## 2024-02-05 DIAGNOSIS — F17200 Nicotine dependence, unspecified, uncomplicated: Secondary | ICD-10-CM

## 2024-02-05 NOTE — Progress Notes (Signed)
 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   Crohn disease (HCC)    CTS (carpal tunnel syndrome)    Diarrhea MULITIFACTORIAL   IBS, LACTOSE INTOLERANCE, SBBO, BILE-SALT   Elevated liver enzymes 2009: BMI 24 ?Etoh ALT 94, AST 51,  NEG ANA, qIGs& ASMA   MAY 2012 AST 52 ALT 38   Esophageal stricture 2009   GERD (gastroesophageal reflux disease)    Hemorrhoid    Hyperlipemia    Hypertension    Hypothyroidism 12/13/2020   Inflammatory bowel disease 2006 CD   SURGICAL REMISSION   LBP (low back pain)    Mental disorder    Migraine    Osteoporosis    PONV (postoperative nausea and vomiting)    Shortness of breath    exertion and humidity   Shoulder pain    right   Sleep apnea    Stop Bang Cock score of 4   Past Surgical History:  Procedure Laterality Date   BACK SURGERY     BACK SURGERY  07/16/2018   BILATERAL SALPINGOOPHORECTOMY     BIOPSY  12/24/2011   Procedure: BIOPSY;  Surgeon: Margo LITTIE Haddock, MD;  Location: [facility];  Service: Endoscopy;;  Gastric Biopsies   BIOPSY N/A 06/30/2012   Procedure: BIOPSY;  Surgeon: Margo LITTIE Haddock, MD;  Location: [facility];  Service: Endoscopy;  Laterality: N/A;  Gastric and Esophageal Biopsies   BIOPSY  06/23/2023   Procedure: BIOPSY;  Surgeon: Cindie Carlin POUR, DO;  [facility];  Service: Endoscopy;;   CARPAL TUNNEL RELEASE  LEFT   CATARACT EXTRACTION W/PHACO Right 07/30/2016   Procedure: CATARACT EXTRACTION PHACO AND INTRAOCULAR LENS PLACEMENT (IOC);  Surgeon: Oneil Platts, MD;  Location: [facility];  Service: Ophthalmology;  Laterality: Right;  CDE: 5.45    CATARACT EXTRACTION W/PHACO Left 08/13/2016   Procedure: CATARACT EXTRACTION PHACO AND INTRAOCULAR LENS PLACEMENT (IOC);  Surgeon: Oneil Platts, MD;  Location: [facility];  Service: Ophthalmology;  Laterality: Left;  CDE: 5.59   COLON SURGERY     COLONOSCOPY  JAN 2009 DIARRHEA, ABD PAIN, BRBPR   ANASTOMOTIC ULCER(3MM), IH Ak:AZWPHW ULCER, NL COLON bX   COLONOSCOPY WITH PROPOFOL  N/A 04/21/2018   examined portion of the ileum normal, two 3-5 mm polyps, diverticulosis in the rectosigmoid and sigmoid colon, external and internal hemorrhoids, significant looping of the colon.  Pathology with tubular adenomas.  Recommendations to follow high-fiber diet and repeat colonoscopy in 5 to 10 years with peds colonoscope.   COLONOSCOPY WITH PROPOFOL  N/A 06/23/2023   Procedure: COLONOSCOPY WITH PROPOFOL ;  Surgeon: Cindie Carlin POUR, DO;  [facility];  Service: Endoscopy;  Laterality: N/A;  10:00 am, asa 3   DILATION AND CURETTAGE OF UTERUS     ESOPHAGEAL DILATION  12/24/2011   SLF:  A stricture was found in the distal esophagus/ Moderate gastritis/ MILD Duodenitis   ESOPHAGEAL DILATION N/A 06/23/2023   Procedure: ESOPHAGEAL DILATION;  Surgeon: Cindie Carlin POUR, DO;  [facility];  Service: Endoscopy;  Laterality: N/A;   ESOPHAGOGASTRODUODENOSCOPY  02/07/09   mild gastritis   ESOPHAGOGASTRODUODENOSCOPY N/A 09/11/2023   Procedure: EGD (ESOPHAGOGASTRODUODENOSCOPY);  Surgeon: Cindie Carlin POUR, DO;  [facility];  Service: Endoscopy;  Laterality: N/A;  8:15 AM, ASA 3   ESOPHAGOGASTRODUODENOSCOPY (EGD) WITH PROPOFOL  N/A 06/30/2012   SLF: 1. No definite stricture appreciated 2. small hiatal hernia 3. Gastritis   ESOPHAGOGASTRODUODENOSCOPY (EGD) WITH PROPOFOL  N/A 02/20/2016   Procedure: ESOPHAGOGASTRODUODENOSCOPY (EGD) WITH PROPOFOL ;  Surgeon: Margo LITTIE Haddock, MD;  [facility];  Service: Endoscopy;  Laterality: N/A;  930   ESOPHAGOGASTRODUODENOSCOPY (EGD) WITH PROPOFOL  N/A  11/24/2018   benign-appearing esophageal peptic stricture s/p dilation, small hiatal hernia, moderate erosive gastritis.    ESOPHAGOGASTRODUODENOSCOPY (EGD) WITH PROPOFOL  N/A 06/23/2023   Procedure: ESOPHAGOGASTRODUODENOSCOPY (EGD) WITH PROPOFOL ;  Surgeon: Cindie Carlin POUR, DO;  [facility];  Service: Endoscopy;  Laterality: N/A;   EYE SURGERY     gum flap Bilateral    HEMICOLECTOMY  RIGHT 2006   HERNIA REPAIR     LUMBAR DISC SURGERY     POLYPECTOMY  04/21/2018   Procedure: POLYPECTOMY;  Surgeon: Haddock Margo LITTIE, MD;  [facility];  Service: Endoscopy;;  (colon)   SAVORY DILATION N/A 06/30/2012   Procedure: SAVORY DILATION;  Surgeon: Margo LITTIE Haddock, MD;  Location: [facility];  Service: Endoscopy;  Laterality: N/A;  12.17mm , 14mm, 15mm   SAVORY DILATION N/A 02/20/2016   Procedure: SAVORY DILATION;  Surgeon: Margo LITTIE Haddock, MD;  [facility];  Service: Endoscopy;  Laterality: N/A;   SAVORY DILATION N/A 11/24/2018   Procedure: SAVORY DILATION;  Surgeon: Haddock Margo LITTIE, MD;  [facility];  Service: Endoscopy;  Laterality: N/A;   UPPER GASTROINTESTINAL ENDOSCOPY  JAN 2009 ABD PAIN   GASTRITIS, ESO RING   UPPER GASTROINTESTINAL ENDOSCOPY  OCT 2010 ABD PAIN, DYSPHAGIA   DIL , GASTRITIS, NL DUODENUM   UPPER GASTROINTESTINAL ENDOSCOPY  OCT 2010 DYSPHAGIA   DIL 17 MM, GASTRITIS, NL DUODENUM   vocal cord surgery  Sep 20, 2010   precancerous areas removed   wisdom tooth extraction Right    pin placement due to broken jaw   Social History   Socioeconomic History   Marital status: Married    Spouse name: Lemond   Number of children: 2   Years of education: GED   Highest education level: Not on file  Occupational History    Comment: disabled  Tobacco Use   Smoking status: Every Day    Current packs/day: 0.50    Average packs/day: 0.5 packs/day for 48.8 years (24.4 ttl pk-yrs)    Types: Cigarettes    Start date: 1977   Smokeless tobacco: Never   Vaping Use   Vaping status: Never Used  Substance and Sexual Activity   Alcohol use: Not Currently    Comment: wine/beer a few times a week    Drug use: No   Sexual activity: Not Currently    Birth control/protection: Post-menopausal  Other Topics Concern   Not on file  Social History Narrative   Lives with husband   no caffiene   Social Drivers of Health   Financial Resource Strain: Low Risk  (12/19/2023)   Overall Financial Resource Strain (CARDIA)  Difficulty of Paying Living Expenses: Not hard at all  Food Insecurity: No Food Insecurity (12/19/2023)   Hunger Vital Sign    Worried About Running Out of Food in the Last Year: Never true    Ran Out of Food in the Last Year: Never true  Transportation Needs: No Transportation Needs (12/19/2023)   PRAPARE - Administrator, Civil Service (Medical): No    Lack of Transportation (Non-Medical): No  Physical Activity: Inactive (12/19/2023)   Exercise Vital Sign    Days of Exercise per Week: 0 days    Minutes of Exercise per Session: 0 min  Stress: No Stress Concern Present (12/19/2023)   [employer] of Occupational Health - Occupational Stress Questionnaire    Feeling of Stress: Not at all  Social Connections: Moderately Isolated (12/19/2023)   Social Connection and Isolation Panel    Frequency of Communication with Friends and Family: Three times a week    Frequency of Social Gatherings with Friends and Family: Once a week    Attends Religious Services: Never    Database administrator or Organizations: No    Attends Engineer, structural: Never    Marital Status: Married   Family History  Problem Relation Age of Onset   Hypothyroidism Mother    Hypertension Father    Heart disease Father    Parkinson's disease Father    Crohn's disease Sister    Other Sister        was murdered   Crohn's disease Maternal Uncle    Heart attack Maternal Grandfather    Alzheimer's disease Paternal Grandmother     Heart attack Paternal Grandfather    Hypothyroidism Daughter    Colon cancer Neg Hx    Colon polyps Neg Hx    Outpatient Encounter Medications as of 02/05/2024  Medication Sig   doxycycline  (VIBRAMYCIN ) 100 MG capsule Take 100 mg by mouth 2 (two) times daily.   methylPREDNISolone  (MEDROL  DOSEPAK) 4 MG TBPK tablet Take 4 mg by mouth as directed.   benzonatate  (TESSALON ) 100 MG capsule TAKE 1 CAPSULE BY MOUTH TWICE DAILY AS NEEDED FOR COUGH   Buprenorphine HCl-Naloxone HCl 4-1 MG FILM Place 1 Film under the tongue 4 (four) times daily.   calcium  carbonate (TUMS - DOSED IN MG ELEMENTAL CALCIUM ) 500 MG chewable tablet Chew 1 tablet (200 mg of elemental calcium  total) by mouth 3 (three) times daily with meals.   clotrimazole  (CLOTRIMAZOLE  AF) 1 % cream Apply 1 application topically 2 (two) times daily. To affected area for 1-2 weeks   cyclobenzaprine  (FLEXERIL ) 5 MG tablet Take 1 tablet by mouth three times daily as needed for muscle spasm   dexlansoprazole  (DEXILANT ) 60 MG capsule Take 1 capsule (60 mg total) by mouth daily.   folic acid  (FOLVITE ) 1 MG tablet Take 1 tablet (1 mg total) by mouth daily.   gabapentin  (NEURONTIN ) 300 MG capsule Take 1 capsule (300 mg total) by mouth 3 (three) times daily.   lidocaine  (LIDODERM ) 5 % Place 1 patch onto the skin daily.   linaclotide  (LINZESS ) 290 MCG CAPS capsule Take 1 capsule (290 mcg total) by mouth daily before breakfast.   magnesium  hydroxide (MILK OF MAGNESIA) 400 MG/5ML suspension Take 30 mLs by mouth daily as needed for mild constipation.   Multiple Vitamin (MULTIVITAMIN WITH MINERALS) TABS tablet Take 1 tablet by mouth daily.   nystatin  (MYCOSTATIN ) 100000 UNIT/ML suspension TAKE 5 MLS BY MOUTH 4 TIMES DAILY AS NEEDED.   SYMBICORT  80-4.5  MCG/ACT inhaler Inhale 2 puffs by mouth twice daily   thiamine  (VITAMIN B-1) 100 MG tablet Take 1 tablet (100 mg total) by mouth daily.   triamcinolone  (KENALOG ) 0.1 % Apply 1 application topically 2 (two) times  daily.   VENTOLIN  HFA 108 (90 Base) MCG/ACT inhaler INHALE 2 PUFFS BY MOUTH EVERY 6 HOURS AS NEEDED FOR WHEEZING FOR SHORTNESS OF BREATH   Facility-Administered Encounter Medications as of 02/05/2024  Medication   cyanocobalamin  (VITAMIN B12) injection 1,000 mcg   ALLERGIES: Allergies  Allergen Reactions   Naproxen Shortness Of Breath    Chest pain   Dicyclomine  Hcl Other (See Comments)    Vision changes   Duloxetine Other (See Comments)    Suicidal    Lovastatin Other (See Comments)    Chest pain   Toradol  [Ketorolac  Tromethamine ] Other (See Comments)    Headache. Non-effective   Tramadol Other (See Comments)    Headache. Non-effective.   Nexium  [Esomeprazole  Magnesium ] Diarrhea    Abdominal pain    VACCINATION STATUS: Immunization History  Administered Date(s) Administered   DT (Pediatric) 02/17/2017   Fluad Trivalent(High Dose 65+) 02/13/2023   H1N1 03/22/2008   Influenza Whole 02/18/2006, 02/03/2007, 02/18/2008, 02/15/2010   Influenza,inj,Quad PF,6+ Mos 04/13/2013, 02/22/2014, 01/09/2016, 02/24/2018, 02/22/2019, 12/27/2021   Influenza-Unspecified 02/21/2017   PNEUMOCOCCAL CONJUGATE-20 02/27/2023   Pneumococcal Polysaccharide-23 02/18/2006, 08/08/2015   Td 06/12/2009   Tdap 08/08/2015, 02/21/2017   Zoster Recombinant(Shingrix) 02/19/2017, 05/20/2017   Zoster, Live 08/08/2015    HPI Dorien B Lenger is 68 y.o. female who presents today with a medical history as above. she is being seen in consultation for osteoporosis requested by Bevely Doffing, FNP.  Patient was previously seen in this clinic for osteoporosis between 2019 and 2020.  She was diagnosed with osteoporosis at the age of 52 years.  She was treated with various forms of osteoporosis medications including oral bisphosphonates, IV Reclast  x 2 and was switched to Prolia  in November 2019.  Her last dose was at the end of 2020.  She was getting her Prolia  injections at her primary care doctor's office, did not get  any extension treatment after Prolia  was discontinued for unclear reasons when her primary care doctor moved away.  Her bone density in October 2029 was consistent with worsening osteoporosis. Patient did not return to this clinic for follow-up until January 16, 2024.  She was then sent for repeat bone density which showed significant bone loss with T-score of -3.9 at the spine and -3.4 at the hips.   She denies any interval fractures nor height loss.  She continues to smoke. She has multiple medical problems on the background which required high-dose steroid treatments over the years including chron's disease, COPD. - She wishes to be resumed on osteoporosis treatment.  She is always slight build, however presents with significant weight loss of unintentional 20 pounds since last visit.  She is on polypharmacy including bronchodilators related to her complex  medical history, not on steroids.   Review of Systems  Constitutional: + Progressive weight loss, + fatigue, no subjective hyperthermia, no subjective hypothermia  Respiratory: + cough, + shortness of breath   Objective:       02/05/2024    1:58 PM 01/16/2024   10:03 AM 12/25/2023   10:23 AM  Vitals with BMI  Height 5' 0 5' 0 5' 0  Weight 99 lbs 10 oz 96 lbs 6 oz 96 lbs 3 oz  BMI 19.45 18.83 18.79  Systolic 122 106 870  Diastolic 80 68 80  Pulse 76 96 104    BP 122/80   Pulse 76   Ht 5' (1.524 m)   Wt 99 lb 9.6 oz (45.2 kg)   BMI 19.45 kg/m   Wt Readings from Last 3 Encounters:  02/05/24 99 lb 9.6 oz (45.2 kg)  01/16/24 96 lb 6.4 oz (43.7 kg)  12/25/23 96 lb 3.2 oz (43.6 kg)    Physical Exam  Constitutional:  Body mass index is 19.45 kg/m.,  not in acute distress,+ anxious state of mind Eyes: PERRLA, EOMI, no exophthalmos ENT: moist mucous membranes, no gross thyromegaly, no gross cervical lymphadenopathy Cardiovascular: normal precordial activity, Regular Rate and Rhythm, no Murmur/Rubs/Gallops Respiratory:   + Labored breathing, diffuse wheezing on bilateral lung fields.    CMP ( most recent) CMP     Component Value Date/Time   NA 138 12/16/2023 1450   NA 137 11/18/2023 0959   K 4.1 12/16/2023 1450   CL 104 12/16/2023 1450   CO2 23 12/16/2023 1450   GLUCOSE 80 12/16/2023 1450   BUN 9 12/16/2023 1450   BUN 8 11/18/2023 0959   CREATININE 0.57 12/16/2023 1450   CREATININE 0.53 07/02/2019 1134   CALCIUM  9.0 12/16/2023 1450   PROT 6.4 12/22/2023 0000   PROT 7.0 02/13/2023 1050   ALBUMIN 3.4 (L) 12/16/2023 1450   ALBUMIN 4.5 02/13/2023 1050   AST 27 12/22/2023 0000   ALT 20 12/22/2023 0000   ALKPHOS 154 (H) 12/16/2023 1450   BILITOT 0.4 12/22/2023 0000   BILITOT 0.5 02/13/2023 1050   EGFR 99 11/18/2023 0959   GFRNONAA >60 12/16/2023 1450   GFRNONAA 110 02/26/2019 1006     Lab Results  Component Value Date   HGBA1C 5.0 09/08/2023   HGBA1C 4.9 02/13/2023   HGBA1C 4.9 12/27/2021     Lipid Panel     Component Value Date/Time   CHOL 238 (H) 09/08/2023 1106   TRIG 63 09/08/2023 1106   HDL 142 09/08/2023 1106   CHOLHDL 1.7 09/08/2023 1106   CHOLHDL NOT CALCULATED 01/31/2020 0844   VLDL 15 01/31/2020 0844   LDLCALC 85 09/08/2023 1106   LDLCALC 93 02/26/2019 1006   LDLDIRECT 29 03/11/2011 1315   LABVLDL 11 09/08/2023 1106      Lab Results  Component Value Date   TSH 3.090 01/16/2024   TSH 1.330 09/08/2023   TSH 1.600 02/13/2023   TSH 2.360 07/22/2022   TSH 1.790 12/27/2021   FREET4 1.14 01/16/2024   FREET4 0.89 09/08/2023   FREET4 1.06 02/13/2023   FREET4 1.22 07/22/2022   FREET4 1.22 12/27/2021     Bone density from February 17, 2018  AP Spine L1-L3 02/17/2018 60.4 Osteoporosis -3.9 0.703 g/cm2 0.7% -    DualFemur Total Mean 02/17/2018 60.4 Osteoporosis -3.0 0.628 g/cm2 -1.3% -   ASSESSMENT: BMD as determined from [facility] L1-L3 is 0.703 g/cm2 with a T-Score of -3.9. This patient is considered osteoporotic according to World Health Organization [facility])  criteria. The scan quality is good. Compared with the prior study on 01/05/2016, the BMD of the lumbar spine and total mean shows no statistically significant change. Compared with the prior study on 01/05/2016, the BMD of the total left femur shows a statistically significant decrease. L4 was excluded due to advanced degenerative changes. Patient is not a candidate for FRAX assessment due to diagnosis of osteoporosis.  Most recent bone density on January 29, 2024 Exclusions: L3-L4 due to surgical repair.   COMPARISON:  02/17/2018.  FINDINGS: Scan quality: Good.   LUMBAR SPINE (L1-L2):  BMD (in g/cm2): 0.694   T-score: -3.9,  Z-score: -2.3   LEFT FEMORAL NECK:   BMD (in g/cm2): 0.662,  T-score: -2.7,  Z-score: -1.2   LEFT TOTAL HIP:   BMD (in g/cm2): 0.583,  T-score: -3.4,   Z-score: -2.1   RIGHT FEMORAL NECK:   BMD (in g/cm2): 0.657,  T-score: -2.7,  Z-score: -1.2   RIGHT TOTAL HIP:   BMD (in g/cm2): 0.606,  T-score: -3.2,  Z-score: -1.9   DUAL-FEMUR TOTAL MEAN:   Rate of change from previous exam: -5.3 %   FRAX 10-YEAR PROBABILITY OF FRACTURE:   FRAX not reported as the lowest BMD is not in the osteopenia range.   IMPRESSION: Osteoporosis based on BMD.  Fracture risk is increased. Increased risk is based on low BMD.  Assessment & Plan:   1. Age-related osteoporosis without current pathological fracture (Primary)  - I have reviewed her new bone density and labs and available  records and clinically evaluated the patient. - Based on these reviews, she has osteoporosis of multifactorial etiology including postmenopausal, chronic exposure to high-dose steroids, smoking with COPD. Previously treated with various medications including oral and IV bisphosphonates as well as at least 2 years of Prolia  treatment until she discontinued them 2020.  I had a long discussion with the patient about the need to resume treatment.  Based on her most recent bone density, next  best option for her will be to resume Prolia  60 mg subcutaneously as soon as procured and every 6 months thereafter.  She does have favorable chemistry and renal function.  She has normal thyroid  function.  She is vitamin D  replete.  She is advised to continue her calcium  supplements with calcium  carbonate 500 mg p.o. 3 times daily AC.  The patient was counseled on the dangers of tobacco use, and was advised to quit.  Reviewed strategies to maximize success, including removing cigarettes and smoking materials from environment.   - she is advised to maintain close follow up with Bevely Doffing, FNP for primary care needs.   I spent  25  minutes in the care of the patient today including review of labs from Thyroid  Function, CMP, and other relevant labs ; imaging/biopsy records (current and previous including abstractions from other facilities); face-to-face time discussing  her lab results and symptoms, medications doses, her options of short and long term treatment based on the latest standards of care / guidelines;   and documenting the encounter.  [name] [name]  participated in the discussions, expressed understanding, and voiced agreement with the above plans.  All questions were answered to her satisfaction. she is encouraged to contact clinic should she have any questions or concerns prior to her return visit.   Follow up plan: Return for Prolia ([place]ub[place]i) Today (ASAP) and P(J) NV, F/U with Pre-visit Labs.   Ranny Earl, MD [facility] [address] [place], [place] [address] Phone: [phone]  Fax: [phone]     02/05/2024, 2:46 PM  This note was partially dictated with voice recognition software. Similar sounding words can be transcribed inadequately or may not  be corrected upon review.

## 2024-02-10 ENCOUNTER — Ambulatory Visit (INDEPENDENT_AMBULATORY_CARE_PROVIDER_SITE_OTHER)

## 2024-02-10 DIAGNOSIS — E538 Deficiency of other specified B group vitamins: Secondary | ICD-10-CM

## 2024-02-10 MED ORDER — CYANOCOBALAMIN 1000 MCG/ML IJ SOLN
1000.0000 ug | Freq: Once | INTRAMUSCULAR | Status: AC
  Administered 2024-02-10: 1000 ug via INTRAMUSCULAR

## 2024-02-10 NOTE — Progress Notes (Signed)
 Patient is in office today for a nurse visit for B12 Injection. Patient Injection was given in the  Right upper quad. gluteus. Patient tolerated injection well.

## 2024-03-02 DIAGNOSIS — M65332 Trigger finger, left middle finger: Secondary | ICD-10-CM | POA: Diagnosis not present

## 2024-03-02 DIAGNOSIS — M79641 Pain in right hand: Secondary | ICD-10-CM | POA: Diagnosis not present

## 2024-03-02 DIAGNOSIS — M67432 Ganglion, left wrist: Secondary | ICD-10-CM | POA: Diagnosis not present

## 2024-03-02 DIAGNOSIS — G5601 Carpal tunnel syndrome, right upper limb: Secondary | ICD-10-CM | POA: Diagnosis not present

## 2024-03-02 DIAGNOSIS — M79642 Pain in left hand: Secondary | ICD-10-CM | POA: Diagnosis not present

## 2024-03-12 ENCOUNTER — Other Ambulatory Visit: Payer: Self-pay

## 2024-03-12 ENCOUNTER — Other Ambulatory Visit (INDEPENDENT_AMBULATORY_CARE_PROVIDER_SITE_OTHER): Payer: Self-pay

## 2024-03-12 ENCOUNTER — Telehealth: Payer: Self-pay | Admitting: Internal Medicine

## 2024-03-12 ENCOUNTER — Ambulatory Visit

## 2024-03-12 DIAGNOSIS — E538 Deficiency of other specified B group vitamins: Secondary | ICD-10-CM | POA: Diagnosis not present

## 2024-03-12 DIAGNOSIS — K5903 Drug induced constipation: Secondary | ICD-10-CM

## 2024-03-12 MED ORDER — LINACLOTIDE 290 MCG PO CAPS: 290.0000 ug | ORAL_CAPSULE | Freq: Every day | ORAL | 3 refills | Status: AC

## 2024-03-12 MED ORDER — CYANOCOBALAMIN 1000 MCG/ML IJ SOLN
1000.0000 ug | Freq: Once | INTRAMUSCULAR | Status: AC
  Administered 2024-03-12: 1000 ug via INTRAMUSCULAR

## 2024-03-12 NOTE — Telephone Encounter (Signed)
 Patient came in office and wants a refill on her Linzess .

## 2024-03-12 NOTE — Progress Notes (Signed)
 Patient is in office today for a nurse visit for B12 Injection. Patient Injection was given in the  Right deltoid. Patient tolerated injection well.

## 2024-03-14 ENCOUNTER — Other Ambulatory Visit: Payer: Self-pay | Admitting: Internal Medicine

## 2024-03-22 ENCOUNTER — Other Ambulatory Visit: Payer: Self-pay | Admitting: Internal Medicine

## 2024-04-12 ENCOUNTER — Other Ambulatory Visit: Payer: Self-pay

## 2024-04-12 ENCOUNTER — Encounter (HOSPITAL_COMMUNITY): Payer: Self-pay

## 2024-04-12 ENCOUNTER — Ambulatory Visit (HOSPITAL_COMMUNITY): Admission: RE | Admit: 2024-04-12 | Discharge: 2024-04-12 | Disposition: A | Source: Ambulatory Visit

## 2024-04-12 DIAGNOSIS — J410 Simple chronic bronchitis: Secondary | ICD-10-CM

## 2024-04-12 DIAGNOSIS — Z1231 Encounter for screening mammogram for malignant neoplasm of breast: Secondary | ICD-10-CM

## 2024-04-13 ENCOUNTER — Ambulatory Visit (INDEPENDENT_AMBULATORY_CARE_PROVIDER_SITE_OTHER)

## 2024-04-13 DIAGNOSIS — E538 Deficiency of other specified B group vitamins: Secondary | ICD-10-CM

## 2024-04-13 MED ORDER — CYANOCOBALAMIN 1000 MCG/ML IJ SOLN
1000.0000 ug | Freq: Once | INTRAMUSCULAR | Status: AC
  Administered 2024-04-13: 1000 ug via INTRAMUSCULAR

## 2024-04-13 NOTE — Progress Notes (Signed)
 Patient is in office today for a nurse visit for B12 Injection. Patient Injection was given in the  Right deltoid. Patient tolerated injection well.

## 2024-04-30 ENCOUNTER — Other Ambulatory Visit: Payer: Self-pay

## 2024-04-30 DIAGNOSIS — J410 Simple chronic bronchitis: Secondary | ICD-10-CM

## 2024-04-30 DIAGNOSIS — M542 Cervicalgia: Secondary | ICD-10-CM

## 2024-05-04 ENCOUNTER — Other Ambulatory Visit: Payer: Self-pay

## 2024-05-04 MED ORDER — FOLIC ACID 1 MG PO TABS: 1.0000 mg | ORAL_TABLET | Freq: Every day | ORAL | 3 refills | Status: AC

## 2024-05-10 ENCOUNTER — Ambulatory Visit (INDEPENDENT_AMBULATORY_CARE_PROVIDER_SITE_OTHER): Admitting: Internal Medicine

## 2024-05-10 ENCOUNTER — Encounter: Payer: Self-pay | Admitting: Internal Medicine

## 2024-05-10 ENCOUNTER — Ambulatory Visit (INDEPENDENT_AMBULATORY_CARE_PROVIDER_SITE_OTHER)

## 2024-05-10 VITALS — BP 112/74 | HR 102 | Temp 96.3°F | Ht 60.0 in | Wt 103.9 lb

## 2024-05-10 DIAGNOSIS — R7989 Other specified abnormal findings of blood chemistry: Secondary | ICD-10-CM | POA: Diagnosis not present

## 2024-05-10 DIAGNOSIS — E538 Deficiency of other specified B group vitamins: Secondary | ICD-10-CM

## 2024-05-10 DIAGNOSIS — R1310 Dysphagia, unspecified: Secondary | ICD-10-CM | POA: Diagnosis not present

## 2024-05-10 DIAGNOSIS — K5903 Drug induced constipation: Secondary | ICD-10-CM

## 2024-05-10 DIAGNOSIS — K760 Fatty (change of) liver, not elsewhere classified: Secondary | ICD-10-CM

## 2024-05-10 DIAGNOSIS — T402X5A Adverse effect of other opioids, initial encounter: Secondary | ICD-10-CM

## 2024-05-10 DIAGNOSIS — K5080 Crohn's disease of both small and large intestine without complications: Secondary | ICD-10-CM | POA: Diagnosis not present

## 2024-05-10 DIAGNOSIS — K219 Gastro-esophageal reflux disease without esophagitis: Secondary | ICD-10-CM | POA: Diagnosis not present

## 2024-05-10 DIAGNOSIS — K50019 Crohn's disease of small intestine with unspecified complications: Secondary | ICD-10-CM

## 2024-05-10 MED ORDER — CYANOCOBALAMIN 1000 MCG/ML IJ SOLN
1000.0000 ug | Freq: Once | INTRAMUSCULAR | Status: AC
  Administered 2024-05-10: 1000 ug via INTRAMUSCULAR

## 2024-05-10 MED ORDER — DEXLANSOPRAZOLE 60 MG PO CPDR: 60.0000 mg | DELAYED_RELEASE_CAPSULE | Freq: Every day | ORAL | 5 refills | Status: AC

## 2024-05-10 NOTE — Progress Notes (Signed)
 Patient is in office today for a nurse visit for B12 Injection. Patient Injection was given in the  Left arm. Patient tolerated injection well.

## 2024-05-10 NOTE — Patient Instructions (Signed)
" °  Continue on dexlansoprazole  60 mg daily for your chronic GERD.  I refilled this for you today and changed to 60-day prescription at [facility].   Continue on Linzess  290 mcg daily  and dried prunes for your constipation.   Follow-up in 3-4 months.   It is always a pleasure seeing both of you.   Dr. Cindie     "

## 2024-05-10 NOTE — Progress Notes (Signed)
 "   Referring Provider: Bevely Doffing, FNP Primary Care Physician:  Bevely Doffing, FNP Primary GI:  Dr. Cindie  Chief Complaint  Patient presents with   Constipation    Pt arrives for follow up. Pt states she was supposed to be seen sooner but was unable to get an appt. Is taking Linzess  and that is doing well per patient. Pt states she has a unknown bacteria in stomach; has had EGD and TCS.     HPI:   [name] [name] is a 69 y.o. female who presents to clinic today for follow-up visit.  Has not been seen since August 2022 (did not follow up).  Crohn's disease: History of Crohn's disease s/p ileocecectomy 2006 with surgical remission. Not chronically on any management for this.  Colonoscopy Feb 2025: non-bleeding internal hemorrhoids, normal colon, patent anastomosis, 5 year surveillance due to history of adenomas.   CT abdomen pelvis with contrast 12/08/2020: Hepatic steatosis, small hiatal hernia with distal esophageal wall thickening otherwise unremarkable.  Colonoscopy 04/21/2018 external/internal hemorrhoids, sigmoid diverticulosis, 2 polyps removed (tubular adenomas on pathology)  Patient denies any blood in her stool or mucus in her stool.    Chronic GERD, esophageal dysphagia: Improved on Dexlansoprazole  60 mg daily.  Dysphagia resolved.   Feb 2025: EGD with findings of small hiatal hernia, Schatzki ring s/p dilation, LA Grade A reflux esophagitis, gastritis (negative H.pylori) and non-bleeding gastric ulcer   May 2025 EGD for PUD surveillance: small hiatal hernia, mild schatzki ring, healed esophagitis, healed prior area of PUD, gastritis s/p biopsy. Path: negative H.pylori.   Hepatic steatosis, abnormal LFTS: Blood work 12/22/23 with AST 27, ALT 20, T. bili 0.4, alk phos 144.    RUQ US  with elastography 06/06/23 R hepatic cyst, 0.3 cm GB polyp, median kPa 5.3.  History of chronic alcohol use.  States she has decreased significantly in this regard.  Constipation: likely  medication induced from buprenorphine. Currently taking Linzess  290 mcg daily and eating dried prunes. States her symptoms are improved.     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   Crohn disease (HCC)    CTS (carpal tunnel syndrome)    Diarrhea MULITIFACTORIAL   IBS, LACTOSE INTOLERANCE, SBBO, BILE-SALT   Elevated liver enzymes 2009: BMI 24 ?Etoh ALT 94, AST 51,  NEG ANA, qIGs& ASMA   MAY 2012 AST 52 ALT 38   Esophageal stricture 2009   GERD (gastroesophageal reflux disease)    Hemorrhoid    Hyperlipemia    Hypertension    Hypothyroidism 12/13/2020   Inflammatory bowel disease 2006 CD   SURGICAL REMISSION   LBP (low back pain)    Mental disorder    Migraine    Osteoporosis    PONV (postoperative nausea and vomiting)    Shortness of breath    exertion and humidity   Shoulder pain    right   Sleep apnea    Stop Bang Cock score of 4    Past Surgical History:  Procedure Laterality Date   BACK SURGERY     BACK SURGERY  07/16/2018   BILATERAL SALPINGOOPHORECTOMY     BIOPSY  12/24/2011   Procedure: BIOPSY;  Surgeon: Margo LITTIE Haddock, MD;  Location: [facility];  Service: Endoscopy;;  Gastric Biopsies   BIOPSY N/A 06/30/2012   Procedure: BIOPSY;  Surgeon: Margo LITTIE Haddock, MD;  Location: [facility];  Service: Endoscopy;  Laterality: N/A;  Gastric and Esophageal Biopsies   BIOPSY  06/23/2023   Procedure: BIOPSY;  Surgeon: Cindie Carlin POUR, DO;  Location: [facility];  Service: Endoscopy;;   CARPAL TUNNEL RELEASE  LEFT   CATARACT EXTRACTION W/PHACO Right 07/30/2016   Procedure: CATARACT EXTRACTION PHACO AND INTRAOCULAR LENS PLACEMENT (IOC);  Surgeon: Oneil Platts, MD;  Location: [facility];  Service: Ophthalmology;  Laterality: Right;  CDE: 5.45   CATARACT EXTRACTION W/PHACO Left 08/13/2016   Procedure:  CATARACT EXTRACTION PHACO AND INTRAOCULAR LENS PLACEMENT (IOC);  Surgeon: Oneil Platts, MD;  Location: [facility];  Service: Ophthalmology;  Laterality: Left;  CDE: 5.59   COLON SURGERY     COLONOSCOPY  JAN 2009 DIARRHEA, ABD PAIN, BRBPR   ANASTOMOTIC ULCER(3MM), IH Ak:AZWPHW ULCER, NL COLON bX   COLONOSCOPY WITH PROPOFOL  N/A 04/21/2018   examined portion of the ileum normal, two 3-5 mm polyps, diverticulosis in the rectosigmoid and sigmoid colon, external and internal hemorrhoids, significant looping of the colon.  Pathology with tubular adenomas.  Recommendations to follow high-fiber diet and repeat colonoscopy in 5 to 10 years with peds colonoscope.   COLONOSCOPY WITH PROPOFOL  N/A 06/23/2023   Procedure: COLONOSCOPY WITH PROPOFOL ;  Surgeon: Cindie Carlin POUR, DO;  [facility]: [facility];  Service: Endoscopy;  Laterality: N/A;  10:00 am, asa 3   DILATION AND CURETTAGE OF UTERUS     ESOPHAGEAL DILATION  12/24/2011   SLF: A stricture was found in the distal esophagus/ Moderate gastritis/ MILD Duodenitis   ESOPHAGEAL DILATION N/A 06/23/2023   Procedure: ESOPHAGEAL DILATION;  Surgeon: Cindie Carlin POUR, DO;  [facility]: [facility];  Service: Endoscopy;  Laterality: N/A;   ESOPHAGOGASTRODUODENOSCOPY  02/07/09   mild gastritis   ESOPHAGOGASTRODUODENOSCOPY N/A 09/11/2023   Procedure: EGD (ESOPHAGOGASTRODUODENOSCOPY);  Surgeon: Cindie Carlin POUR, DO;  [facility]: [facility];  Service: Endoscopy;  Laterality: N/A;  8:15 AM, ASA 3   ESOPHAGOGASTRODUODENOSCOPY (EGD) WITH PROPOFOL  N/A 06/30/2012   SLF: 1. No definite stricture appreciated 2. small hiatal hernia 3. Gastritis   ESOPHAGOGASTRODUODENOSCOPY (EGD) WITH PROPOFOL  N/A 02/20/2016   Procedure: ESOPHAGOGASTRODUODENOSCOPY (EGD) WITH PROPOFOL ;  Surgeon: Margo LITTIE Haddock, MD;  [facility]: [facility];  Service: Endoscopy;  Laterality: N/A;  930   ESOPHAGOGASTRODUODENOSCOPY (EGD) WITH PROPOFOL  N/A 11/24/2018   benign-appearing esophageal peptic stricture s/p  dilation, small hiatal hernia, moderate erosive gastritis.    ESOPHAGOGASTRODUODENOSCOPY (EGD) WITH PROPOFOL  N/A 06/23/2023   Procedure: ESOPHAGOGASTRODUODENOSCOPY (EGD) WITH PROPOFOL ;  Surgeon: Cindie Carlin POUR, DO;  [facility]: [facility];  Service: Endoscopy;  Laterality: N/A;   EYE SURGERY     gum flap Bilateral    HEMICOLECTOMY  RIGHT 2006   HERNIA REPAIR     LUMBAR DISC SURGERY     POLYPECTOMY  04/21/2018   Procedure: POLYPECTOMY;  Surgeon: Haddock Margo LITTIE, MD;  Location: [facility];  Service: Endoscopy;;  (colon)   SAVORY DILATION N/A 06/30/2012   Procedure: SAVORY DILATION;  Surgeon: Margo LITTIE Haddock, MD;  Location: [facility];  Service: Endoscopy;  Laterality: N/A;  12.62mm , 14mm, 15mm   SAVORY DILATION N/A 02/20/2016   Procedure: SAVORY DILATION;  Surgeon: Margo LITTIE Haddock, MD;  [facility]: [facility];  Service: Endoscopy;  Laterality: N/A;   SAVORY DILATION N/A 11/24/2018   Procedure: SAVORY DILATION;  Surgeon: Haddock Margo LITTIE, MD;  [facility]: [facility];  Service: Endoscopy;  Laterality: N/A;   UPPER GASTROINTESTINAL ENDOSCOPY  JAN 2009 ABD PAIN   GASTRITIS, ESO RING   UPPER GASTROINTESTINAL ENDOSCOPY  OCT 2010 ABD PAIN, DYSPHAGIA   DIL , GASTRITIS, NL DUODENUM   UPPER GASTROINTESTINAL ENDOSCOPY  OCT 2010 DYSPHAGIA   DIL 17 MM, GASTRITIS, NL DUODENUM   vocal cord surgery  Sep 20, 2010   precancerous areas removed   wisdom tooth extraction Right    pin placement due to broken jaw    Current Outpatient Medications  Medication Sig Dispense Refill   benzonatate  (TESSALON ) 100 MG capsule TAKE 1 CAPSULE BY MOUTH TWICE DAILY AS NEEDED FOR COUGH 30 capsule 0   Buprenorphine HCl-Naloxone HCl 4-1 MG FILM Place 1 Film under the tongue 4 (four) times daily.     calcium  carbonate (TUMS - DOSED IN MG ELEMENTAL CALCIUM ) 500 MG chewable tablet Chew 1 tablet (200 mg of elemental calcium  total) by mouth 3 (three) times daily with meals. 90 tablet 6   clotrimazole  (CLOTRIMAZOLE  AF) 1 %  cream Apply 1 application topically 2 (two) times daily. To affected area for 1-2 weeks 30 g 0   cyclobenzaprine  (FLEXERIL ) 5 MG tablet Take 1 tablet by mouth three times daily as needed for muscle spasm 60 tablet 0   doxycycline  (VIBRAMYCIN ) 100 MG capsule Take 100 mg by mouth 2 (two) times daily.     folic acid  (FOLVITE ) 1 MG tablet Take 1 tablet (1 mg total) by mouth daily. 90 tablet 3   gabapentin  (NEURONTIN ) 300 MG capsule Take 1 capsule (300 mg total) by mouth 3 (three) times daily. 90 capsule 3   lidocaine  (LIDODERM ) 5 % Place 1 patch onto the skin daily.     linaclotide  (LINZESS ) 290 MCG CAPS capsule Take 1 capsule (290 mcg total) by mouth daily before breakfast. 90 capsule 3   magnesium  hydroxide (MILK OF MAGNESIA) 400 MG/5ML suspension Take 30 mLs by mouth daily as needed for mild constipation.     methylPREDNISolone  (MEDROL  DOSEPAK) 4 MG TBPK tablet Take 4 mg by mouth as directed.     Multiple Vitamin (MULTIVITAMIN WITH MINERALS) TABS tablet Take 1 tablet by mouth daily.     nystatin  (MYCOSTATIN ) 100000 UNIT/ML suspension TAKE 5 MLS BY MOUTH 4 TIMES DAILY AS NEEDED. 500 mL 0   SYMBICORT  80-4.5 MCG/ACT inhaler Inhale 2 puffs by mouth twice daily 33 g 0   thiamine  (VITAMIN B-1) 100 MG tablet Take 1 tablet (100 mg total) by mouth daily. 30 tablet 5   triamcinolone  (KENALOG ) 0.1 % Apply 1 application topically 2 (two) times daily. 30 g 0   UNABLE TO FIND Med Name: NeuroQ Brain Health DHA 400 Omega 3 Fish Oil  Memory and Focus     VENTOLIN  HFA 108 (90 Base) MCG/ACT inhaler INHALE 2 PUFFS BY MOUTH EVERY 6 HOURS AS NEEDED FOR WHEEZING FOR SHORTNESS OF BREATH 18 g 0   dexlansoprazole  (DEXILANT ) 60 MG capsule Take 1 capsule (60 mg total) by mouth daily. 60 capsule 5   Current Facility-Administered Medications  Medication Dose Route Frequency Provider Last Rate Last Admin   cyanocobalamin  (VITAMIN B12) injection 1,000 mcg  1,000 mcg Intramuscular Q30 days    1,000 mcg at 12/24/23 1549     Allergies as of 05/10/2024 - Review Complete 05/10/2024  Allergen Reaction Noted   Naproxen Shortness Of Breath 01/09/2016   Dicyclomine  hcl Other (See Comments) 01/24/2021   Duloxetine Other (See Comments) 01/24/2021   Lovastatin Other (See  Comments) 09/25/2010   Toradol  [ketorolac  tromethamine ] Other (See Comments) 02/20/2016   Tramadol Other (See Comments) 02/20/2016   Nexium  [esomeprazole  magnesium ] Diarrhea 06/12/2012    Family History  Problem Relation Age of Onset   Hypothyroidism Mother    Hypertension Father    Heart disease Father    Parkinson's disease Father    Crohn's disease Sister    Other Sister        was murdered   Crohn's disease Maternal Uncle    Heart attack Maternal Grandfather    Alzheimer's disease Paternal Grandmother    Heart attack Paternal Grandfather    Hypothyroidism Daughter    Colon cancer Neg Hx    Colon polyps Neg Hx     Social History   Socioeconomic History   Marital status: Married    Spouse name: Lemond   Number of children: 2   Years of education: GED   Highest education level: Not on file  Occupational History    Comment: disabled  Tobacco Use   Smoking status: Every Day    Current packs/day: 0.50    Average packs/day: 0.5 packs/day for 49.0 years (24.5 ttl pk-yrs)    Types: Cigarettes    Start date: 1977   Smokeless tobacco: Never  Vaping Use   Vaping status: Never Used  Substance and Sexual Activity   Alcohol use: Not Currently    Comment: wine/beer a few times a week    Drug use: No   Sexual activity: Not Currently    Birth control/protection: Post-menopausal  Other Topics Concern   Not on file  Social History Narrative   Lives with husband   no caffiene   Social Drivers of Health   Tobacco Use: High Risk (05/10/2024)   Patient History    Smoking Tobacco Use: Every Day    Smokeless Tobacco Use: Never    Passive Exposure: Not on file  Financial Resource Strain: Low Risk (12/19/2023)   Overall Financial  Resource Strain (CARDIA)    Difficulty of Paying Living Expenses: Not hard at all  Food Insecurity: No Food Insecurity (12/19/2023)   Epic    Worried About Radiation Protection Practitioner of Food in the Last Year: Never true    Ran Out of Food in the Last Year: Never true  Transportation Needs: No Transportation Needs (12/19/2023)   Epic    Lack of Transportation (Medical): No    Lack of Transportation (Non-Medical): No  Physical Activity: Inactive (12/19/2023)   Exercise Vital Sign    Days of Exercise per Week: 0 days    Minutes of Exercise per Session: 0 min  Stress: No Stress Concern Present (12/19/2023)   [employer] of Occupational Health - Occupational Stress Questionnaire    Feeling of Stress: Not at all  Social Connections: Moderately Isolated (12/19/2023)   Social Connection and Isolation Panel    Frequency of Communication with Friends and Family: Three times a week    Frequency of Social Gatherings with Friends and Family: Once a week    Attends Religious Services: Never    Database Administrator or Organizations: No    Attends Banker Meetings: Never    Marital Status: Married  Depression (PHQ2-9): Medium Risk (12/24/2023)   Depression (PHQ2-9)    PHQ-2 Score: 5  Alcohol Screen: Low Risk (12/19/2023)   Alcohol Screen    Last Alcohol Screening Score (AUDIT): 0  Housing: Low Risk (12/19/2023)   Epic    Unable to Pay for Housing in the  Last Year: No    Number of Times Moved in the Last Year: 0    Homeless in the Last Year: No  Utilities: Not At Risk (12/19/2023)   Epic    Threatened with loss of utilities: No  Health Literacy: Adequate Health Literacy (12/19/2023)   B1300 Health Literacy    Frequency of need for help with medical instructions: Never    Subjective: Review of Systems  Constitutional:  Negative for chills and fever.  HENT:  Negative for congestion and hearing loss.   Eyes:  Negative for blurred vision and double vision.  Respiratory:  Negative for cough  and shortness of breath.   Cardiovascular:  Negative for chest pain and palpitations.  Gastrointestinal:  Positive for abdominal pain and heartburn. Negative for blood in stool, constipation, diarrhea, melena and vomiting.       Dysphagia  Genitourinary:  Negative for dysuria and urgency.  Musculoskeletal:  Negative for joint pain and myalgias.  Skin:  Negative for itching and rash.  Neurological:  Negative for dizziness and headaches.  Psychiatric/Behavioral:  Negative for depression. The patient is not nervous/anxious.      Objective: BP 112/74   Pulse (!) 102   Temp (!) 96.3 F (35.7 C)   Ht 5' (1.524 m)   Wt 103 lb 14.4 oz (47.1 kg)   BMI 20.29 kg/m  Physical Exam Constitutional:      Appearance: Normal appearance.  HENT:     Head: Normocephalic and atraumatic.  Eyes:     Extraocular Movements: Extraocular movements intact.     Conjunctiva/sclera: Conjunctivae normal.  Cardiovascular:     Rate and Rhythm: Normal rate and regular rhythm.  Pulmonary:     Effort: Pulmonary effort is normal.     Breath sounds: Normal breath sounds.  Abdominal:     General: Bowel sounds are normal.     Palpations: Abdomen is soft.  Musculoskeletal:        General: No swelling. Normal range of motion.     Cervical back: Normal range of motion and neck supple.  Skin:    General: Skin is warm and dry.     Coloration: Skin is not jaundiced.  Neurological:     General: No focal deficit present.     Mental Status: She is alert and oriented to person, place, and time.  Psychiatric:        Mood and Affect: Mood normal.        Behavior: Behavior normal.      Assessment/Plan:  1.  Crohn's disease-s/p ileocecectomy 2006 with surgical remission. Not chronically on any management for this.  Updated colonoscopy as above.  Continue to monitor.  2.  Chronic GERD, esophageal dysphagia -symptoms improved on dexlansoprazole  60 mg daily. Will continue.  3.  Hepatic steatosis, abnormal  LFTs-likely MASH, alcohol likely also playing a role.  Most recent LFTs normalized.  Will check CBC and CMP today. Continue to limit alcohol use.    RUQ US  with elastography 06/06/23 R hepatic cyst, 0.3 cm GB polyp, median kPa 5.3.  4.  Opioid-induced constipation-symptoms improved status post MiraLAX  purge and chronic therapy with Linzess  290 mcg daily.  Will continue.  Follow-up in 3 to 4 months.   05/10/2024 10:08 AM   Disclaimer: This note was dictated with voice recognition software. Similar sounding words can inadvertently be transcribed and may not be corrected upon review.  "

## 2024-05-12 ENCOUNTER — Encounter (HOSPITAL_BASED_OUTPATIENT_CLINIC_OR_DEPARTMENT_OTHER): Payer: Self-pay | Admitting: Orthopedic Surgery

## 2024-05-12 ENCOUNTER — Other Ambulatory Visit: Payer: Self-pay

## 2024-05-13 LAB — COMPREHENSIVE METABOLIC PANEL WITH GFR
AG Ratio: 1.8 (calc) (ref 1.0–2.5)
ALT: 14 U/L (ref 6–29)
AST: 22 U/L (ref 10–35)
Albumin: 4.2 g/dL (ref 3.6–5.1)
Alkaline phosphatase (APISO): 151 U/L (ref 37–153)
BUN/Creatinine Ratio: 23 (calc) — ABNORMAL HIGH (ref 6–22)
BUN: 9 mg/dL (ref 7–25)
CO2: 24 mmol/L (ref 20–32)
Calcium: 8.9 mg/dL (ref 8.6–10.4)
Chloride: 105 mmol/L (ref 98–110)
Creat: 0.4 mg/dL — ABNORMAL LOW (ref 0.50–1.05)
Globulin: 2.4 g/dL (ref 1.9–3.7)
Glucose, Bld: 81 mg/dL (ref 65–99)
Potassium: 4.4 mmol/L (ref 3.5–5.3)
Sodium: 136 mmol/L (ref 135–146)
Total Bilirubin: 0.4 mg/dL (ref 0.2–1.2)
Total Protein: 6.6 g/dL (ref 6.1–8.1)
eGFR: 109 mL/min/1.73m2

## 2024-05-13 LAB — CBC
HCT: 44.1 % (ref 35.9–46.0)
Hemoglobin: 14.7 g/dL (ref 11.7–15.5)
MCH: 30.6 pg (ref 27.0–33.0)
MCHC: 33.3 g/dL (ref 31.6–35.4)
MCV: 91.9 fL (ref 81.4–101.7)
MPV: 10.7 fL (ref 7.5–12.5)
Platelets: 304 Thousand/uL (ref 140–400)
RBC: 4.8 Million/uL (ref 3.80–5.10)
RDW: 12.4 % (ref 11.0–15.0)
WBC: 5.6 Thousand/uL (ref 3.8–10.8)

## 2024-05-14 ENCOUNTER — Encounter (INDEPENDENT_AMBULATORY_CARE_PROVIDER_SITE_OTHER): Payer: Self-pay | Admitting: Otolaryngology

## 2024-05-14 ENCOUNTER — Ambulatory Visit (INDEPENDENT_AMBULATORY_CARE_PROVIDER_SITE_OTHER): Admitting: Otolaryngology

## 2024-05-14 VITALS — BP 154/93 | HR 98 | Ht 60.0 in | Wt 100.0 lb

## 2024-05-14 DIAGNOSIS — H9193 Unspecified hearing loss, bilateral: Secondary | ICD-10-CM | POA: Diagnosis not present

## 2024-05-14 DIAGNOSIS — H903 Sensorineural hearing loss, bilateral: Secondary | ICD-10-CM | POA: Insufficient documentation

## 2024-05-14 DIAGNOSIS — H9311 Tinnitus, right ear: Secondary | ICD-10-CM | POA: Diagnosis not present

## 2024-05-14 NOTE — Progress Notes (Signed)
 CC: Bilateral hearing loss  Discussed the use of AI scribe software for clinical note transcription with the patient, who gave verbal consent to proceed.  History of Present Illness [name] [name] is a 69 year old female who presents today for evaluation of her bilateral hearing loss.  She has also noted occasional right ear tinnitus.  She reports progressive hearing loss of unclear duration. She currently uses hearing aids, prescribed after audiologic testing in [place]. She has been unable to obtain prior audiograms despite multiple requests.  She is concerned that her hearing loss may be secondary to medical causes.  She experiences occasional right-sided tinnitus, describing the sound as similar to someone tuning in a violin. She denies tinnitus in the left ear, otalgia, otorrhea, vertigo, or other associated otologic symptoms.   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   Crohn disease (HCC)    CTS (carpal tunnel syndrome)    Diarrhea MULITIFACTORIAL   IBS, LACTOSE INTOLERANCE, SBBO, BILE-SALT   Elevated liver enzymes 2009: BMI 24 ?Etoh ALT 94, AST 51,  NEG ANA, qIGs& ASMA   MAY 2012 AST 52 ALT 38   Esophageal stricture 2009   GERD (gastroesophageal reflux disease)    Hemorrhoid    Hyperlipemia    Hypertension    Hypothyroidism 12/13/2020   Inflammatory bowel disease 2006 CD   SURGICAL REMISSION   LBP (low back pain)    Mental disorder    Migraine    Osteoporosis    PONV (postoperative nausea and vomiting)    Shortness of breath    exertion and humidity   Shoulder pain    right   Sleep apnea    Stop Bang Cock score of 4    Past Surgical History:  Procedure Laterality Date   BACK SURGERY     BACK SURGERY  07/16/2018   BILATERAL SALPINGOOPHORECTOMY     BIOPSY  12/24/2011   Procedure:  BIOPSY;  Surgeon: Margo LITTIE Haddock, MD;  Location: [facility];  Service: Endoscopy;;  Gastric Biopsies   BIOPSY N/A 06/30/2012   Procedure: BIOPSY;  Surgeon: Margo LITTIE Haddock, MD;  Location: [facility];  Service: Endoscopy;  Laterality: N/A;  Gastric and Esophageal Biopsies   BIOPSY  06/23/2023   Procedure: BIOPSY;  Surgeon: Cindie Carlin POUR, DO;  [facility];  Service: Endoscopy;;   CARPAL TUNNEL RELEASE  LEFT   CATARACT EXTRACTION W/PHACO Right 07/30/2016   Procedure: CATARACT EXTRACTION PHACO AND INTRAOCULAR LENS PLACEMENT (IOC);  Surgeon: Oneil Platts, MD;  Location: [facility];  Service: Ophthalmology;  Laterality: Right;  CDE: 5.45   CATARACT EXTRACTION W/PHACO Left 08/13/2016   Procedure: CATARACT EXTRACTION PHACO AND INTRAOCULAR LENS PLACEMENT (IOC);  Surgeon: Oneil Platts, MD;  Location: [facility];  Service: Ophthalmology;  Laterality: Left;  CDE: 5.59   COLON SURGERY     COLONOSCOPY  JAN 2009 DIARRHEA, ABD PAIN, BRBPR   ANASTOMOTIC ULCER(3MM), IH Ak:AZWPHW ULCER, NL COLON bX   COLONOSCOPY WITH PROPOFOL  N/A 04/21/2018   examined portion of the ileum normal, two 3-5 mm polyps, diverticulosis in the rectosigmoid and sigmoid colon, external and internal hemorrhoids, significant looping of the colon.  Pathology with tubular adenomas.  Recommendations to follow high-fiber diet and repeat colonoscopy in 5  to 10 years with peds colonoscope.   COLONOSCOPY WITH PROPOFOL  N/A 06/23/2023   Procedure: COLONOSCOPY WITH PROPOFOL ;  Surgeon: Cindie Carlin POUR, DO;  [facility];  Service: Endoscopy;  Laterality: N/A;  10:00 am, asa 3   DILATION AND CURETTAGE OF UTERUS     ESOPHAGEAL DILATION  12/24/2011   SLF: A stricture was found in the distal esophagus/ Moderate gastritis/ MILD Duodenitis   ESOPHAGEAL DILATION N/A 06/23/2023   Procedure: ESOPHAGEAL DILATION;  Surgeon: Cindie Carlin POUR, DO;  [facility];  Service: Endoscopy;  Laterality: N/A;   ESOPHAGOGASTRODUODENOSCOPY  02/07/09   mild  gastritis   ESOPHAGOGASTRODUODENOSCOPY N/A 09/11/2023   Procedure: EGD (ESOPHAGOGASTRODUODENOSCOPY);  Surgeon: Cindie Carlin POUR, DO;  [facility];  Service: Endoscopy;  Laterality: N/A;  8:15 AM, ASA 3   ESOPHAGOGASTRODUODENOSCOPY (EGD) WITH PROPOFOL  N/A 06/30/2012   SLF: 1. No definite stricture appreciated 2. small hiatal hernia 3. Gastritis   ESOPHAGOGASTRODUODENOSCOPY (EGD) WITH PROPOFOL  N/A 02/20/2016   Procedure: ESOPHAGOGASTRODUODENOSCOPY (EGD) WITH PROPOFOL ;  Surgeon: Margo LITTIE Haddock, MD;  [facility];  Service: Endoscopy;  Laterality: N/A;  930   ESOPHAGOGASTRODUODENOSCOPY (EGD) WITH PROPOFOL  N/A 11/24/2018   benign-appearing esophageal peptic stricture s/p dilation, small hiatal hernia, moderate erosive gastritis.    ESOPHAGOGASTRODUODENOSCOPY (EGD) WITH PROPOFOL  N/A 06/23/2023   Procedure: ESOPHAGOGASTRODUODENOSCOPY (EGD) WITH PROPOFOL ;  Surgeon: Cindie Carlin POUR, DO;  [facility];  Service: Endoscopy;  Laterality: N/A;   EYE SURGERY     gum flap Bilateral    HEMICOLECTOMY  RIGHT 2006   HERNIA REPAIR     LUMBAR DISC SURGERY     POLYPECTOMY  04/21/2018   Procedure: POLYPECTOMY;  Surgeon: Haddock Margo LITTIE, MD;  [facility];  Service: Endoscopy;;  (colon)   SAVORY DILATION N/A 06/30/2012   Procedure: SAVORY DILATION;  Surgeon: Margo LITTIE Haddock, MD;  Location: [facility];  Service: Endoscopy;  Laterality: N/A;  12.78mm , 14mm, 15mm   SAVORY DILATION N/A 02/20/2016   Procedure: SAVORY DILATION;  Surgeon: Margo LITTIE Haddock, MD;  [facility];  Service: Endoscopy;  Laterality: N/A;   SAVORY DILATION N/A 11/24/2018   Procedure: SAVORY DILATION;  Surgeon: Haddock Margo LITTIE, MD;  [facility];  Service: Endoscopy;  Laterality: N/A;   UPPER GASTROINTESTINAL ENDOSCOPY  JAN 2009 ABD PAIN   GASTRITIS, ESO RING   UPPER GASTROINTESTINAL ENDOSCOPY  OCT 2010 ABD PAIN, DYSPHAGIA   DIL , GASTRITIS, NL DUODENUM   UPPER GASTROINTESTINAL  ENDOSCOPY  OCT 2010 DYSPHAGIA   DIL 17 MM, GASTRITIS, NL DUODENUM   vocal cord surgery  Sep 20, 2010   precancerous areas removed   wisdom tooth extraction Right    pin placement due to broken jaw    Family History  Problem Relation Age of Onset   Hypothyroidism Mother    Hypertension Father    Heart disease Father    Parkinson's disease Father    Crohn's disease Sister    Other Sister        was murdered   Crohn's disease Maternal Uncle    Heart attack Maternal Grandfather    Alzheimer's disease Paternal Grandmother    Heart attack Paternal Grandfather    Hypothyroidism Daughter    Colon cancer Neg Hx    Colon polyps Neg Hx     Social History:  reports that she has been smoking cigarettes. She started smoking about 49 years ago. She has a 24.5 pack-year  smoking history. She has never used smokeless tobacco. She reports that she does not currently use alcohol. She reports that she does not use drugs.  Allergies: Allergies[1]  Prior to Admission medications  Medication Sig Start Date End Date Taking? Authorizing Provider  benzonatate  (TESSALON ) 100 MG capsule TAKE 1 CAPSULE BY MOUTH TWICE DAILY AS NEEDED FOR COUGH 05/03/24  Yes Bevely Doffing, FNP  Buprenorphine HCl-Naloxone HCl 4-1 MG FILM Place 1 Film under the tongue 4 (four) times daily. 07/23/23  Yes [provider]  calcium  carbonate (TUMS - DOSED IN MG ELEMENTAL CALCIUM ) 500 MG chewable tablet Chew 1 tablet (200 mg of elemental calcium  total) by mouth 3 (three) times daily with meals. 03/02/19  Yes Lenis Ethelle ORN, MD  clotrimazole  (CLOTRIMAZOLE  AF) 1 % cream Apply 1 application topically 2 (two) times daily. To affected area for 1-2 weeks 01/04/20  Yes [place], Theodoro FALCON, MD  cyclobenzaprine  (FLEXERIL ) 5 MG tablet Take 1 tablet by mouth three times daily as needed for muscle spasm 05/03/24  Yes Bevely Doffing, FNP  dexlansoprazole  (DEXILANT ) 60 MG capsule Take 1 capsule (60 mg total) by mouth daily. 05/10/24  05/10/25 Yes Carver, Carlin POUR, DO  doxycycline  (VIBRAMYCIN ) 100 MG capsule Take 100 mg by mouth 2 (two) times daily. 01/16/24  Yes [provider]  folic acid  (FOLVITE ) 1 MG tablet Take 1 tablet (1 mg total) by mouth daily. 05/04/24  Yes Bevely Doffing, FNP  gabapentin  (NEURONTIN ) 300 MG capsule Take 1 capsule (300 mg total) by mouth 3 (three) times daily. 10/31/23  Yes Emokpae, Courage, MD  lidocaine  (LIDODERM ) 5 % Place 1 patch onto the skin daily. 05/19/23  Yes [provider]  linaclotide  (LINZESS ) 290 MCG CAPS capsule Take 1 capsule (290 mcg total) by mouth daily before breakfast. 03/12/24  Yes Shirlean Therisa ORN, NP  magnesium  hydroxide (MILK OF MAGNESIA) 400 MG/5ML suspension Take 30 mLs by mouth daily as needed for mild constipation.   Yes [provider]  methylPREDNISolone  (MEDROL  DOSEPAK) 4 MG TBPK tablet Take 4 mg by mouth as directed. 02/03/24  Yes [provider]  Multiple Vitamin (MULTIVITAMIN WITH MINERALS) TABS tablet Take 1 tablet by mouth daily. 11/01/23  Yes Emokpae, Courage, MD  nystatin  (MYCOSTATIN ) 100000 UNIT/ML suspension TAKE 5 MLS BY MOUTH 4 TIMES DAILY AS NEEDED. 09/25/23  Yes Melvenia Manus BRAVO, MD  SYMBICORT  80-4.5 MCG/ACT inhaler Inhale 2 puffs by mouth twice daily 05/03/24  Yes Bevely Doffing, FNP  thiamine  (VITAMIN B-1) 100 MG tablet Take 1 tablet (100 mg total) by mouth daily. 11/01/23  Yes Emokpae, Courage, MD  triamcinolone  (KENALOG ) 0.1 % Apply 1 application topically 2 (two) times daily. 07/28/20  Yes Tobie Suzzane POUR, MD  UNABLE TO FIND Med Name: [facility] DHA 400 Omega 3 Fish Oil  Memory and Focus   Yes [provider]  VENTOLIN  HFA 108 (90 Base) MCG/ACT inhaler INHALE 2 PUFFS BY MOUTH EVERY 6 HOURS AS NEEDED FOR WHEEZING FOR SHORTNESS OF BREATH 05/03/24  Yes Bevely Doffing, FNP    Blood pressure (!) 154/93, pulse 98, height 5' (1.524 m), weight 100 lb (45.4 kg), SpO2 96%. Exam: General: Communicates without  difficulty, well nourished, no acute distress. Head: Normocephalic, no evidence injury, no tenderness, facial buttresses intact without stepoff. Face/sinus: No tenderness to palpation and percussion. Facial movement is normal and symmetric. Eyes: PERRL, EOMI. No scleral icterus, conjunctivae clear. Neuro: CN II exam reveals vision grossly intact.  No nystagmus at any point of gaze. Ears: Auricles  well formed without lesions.  Ear canals are intact without mass or lesion.  No erythema or edema is appreciated.  The TMs are intact without fluid. Nose: External evaluation reveals normal support and skin without lesions.  Dorsum is intact.  Anterior rhinoscopy reveals congested mucosa over anterior aspect of inferior turbinates and intact septum.  No purulence noted. Oral:  Oral cavity and oropharynx are intact, symmetric, without erythema or edema.  Mucosa is moist without lesions.  Bilateral tonsillar cysts.  Neck: Full range of motion without pain.  There is no significant lymphadenopathy.  No masses palpable.  Thyroid  bed within normal limits to palpation.  Parotid glands and submandibular glands equal bilaterally without mass.  Trachea is midline. Neuro:  CN 2-12 grossly intact.   Assessment & Plan Bilateral hearing loss and intermittent right-sided tinnitus She has chronic hearing loss with occasional right-sided tinnitus. Otoscopic and physical examination demonstrated normal external auditory canals, tympanic membranes, and middle ear spaces bilaterally, without evidence of acute pathology.  - Scheduled audiometric evaluation to characterize the type and severity of hearing loss and assess for asymmetry or conductive component. - Advised follow-up after audiometry to review results and determine further management. - Provided counseling on tinnitus management strategies, including use of white noise or soft rain sounds for symptomatic relief.       Yomaira Solar W Claretha Townshend 05/14/2024, 11:37 AM      [1]   Allergies Allergen Reactions   Naproxen Shortness Of Breath    Chest pain   Dicyclomine  Hcl Other (See Comments)    Vision changes   Duloxetine Other (See Comments)    Suicidal    Lovastatin Other (See Comments)    Chest pain   Toradol  [Ketorolac  Tromethamine ] Other (See Comments)    Headache. Non-effective   Tramadol Other (See Comments)    Headache. Non-effective.   Nexium  [Esomeprazole  Magnesium ] Diarrhea    Abdominal pain

## 2024-05-18 NOTE — Anesthesia Preprocedure Evaluation (Signed)
"                                    Anesthesia Evaluation  Patient identified by MRN, date of birth, ID band Patient awake    Reviewed: Allergy & Precautions, NPO status , Patient's Chart, lab work & pertinent test results  History of Anesthesia Complications (+) PONV and history of anesthetic complications  Airway Mallampati: II  TM Distance: >3 FB Neck ROM: Full    Dental  (+) Missing,    Pulmonary sleep apnea , COPD,  COPD inhaler, Current Smoker and Patient abstained from smoking.   Pulmonary exam normal        Cardiovascular hypertension, Normal cardiovascular exam     Neuro/Psych  Headaches  Anxiety Depression       GI/Hepatic Neg liver ROS,GERD  Medicated and Controlled,,Crohn disease   Endo/Other  negative endocrine ROS    Renal/GU negative Renal ROS     Musculoskeletal  (+) Arthritis ,    Abdominal   Peds  Hematology negative hematology ROS (+)   Anesthesia Other Findings On chronic buprenophrine  Reproductive/Obstetrics                              Anesthesia Physical Anesthesia Plan  ASA: 2  Anesthesia Plan: MAC   Post-op Pain Management: Tylenol  PO (pre-op)*   Induction:   PONV Risk Score and Plan: 2 and Treatment may vary due to age or medical condition, Propofol  infusion, Ondansetron  and Midazolam   Airway Management Planned: Natural Airway and Simple Face Mask  Additional Equipment: None  Intra-op Plan:   Post-operative Plan:   Informed Consent: I have reviewed the patients History and Physical, chart, labs and discussed the procedure including the risks, benefits and alternatives for the proposed anesthesia with the patient or authorized representative who has indicated his/her understanding and acceptance.       Plan Discussed with: CRNA  Anesthesia Plan Comments:          Anesthesia Quick Evaluation  "

## 2024-05-19 ENCOUNTER — Ambulatory Visit (HOSPITAL_BASED_OUTPATIENT_CLINIC_OR_DEPARTMENT_OTHER): Admitting: Anesthesiology

## 2024-05-19 ENCOUNTER — Encounter (HOSPITAL_BASED_OUTPATIENT_CLINIC_OR_DEPARTMENT_OTHER): Payer: Self-pay | Admitting: Orthopedic Surgery

## 2024-05-19 ENCOUNTER — Ambulatory Visit: Admitting: Gastroenterology

## 2024-05-19 ENCOUNTER — Ambulatory Visit (HOSPITAL_BASED_OUTPATIENT_CLINIC_OR_DEPARTMENT_OTHER)
Admission: RE | Admit: 2024-05-19 | Discharge: 2024-05-19 | Disposition: A | Discharge status: Home / Self Care | Attending: Orthopedic Surgery | Admitting: Orthopedic Surgery

## 2024-05-19 ENCOUNTER — Other Ambulatory Visit: Payer: Self-pay

## 2024-05-19 ENCOUNTER — Encounter (HOSPITAL_BASED_OUTPATIENT_CLINIC_OR_DEPARTMENT_OTHER): Admitting: Anesthesiology

## 2024-05-19 ENCOUNTER — Encounter (HOSPITAL_BASED_OUTPATIENT_CLINIC_OR_DEPARTMENT_OTHER): Admission: RE | Payer: Self-pay | Source: Home / Self Care

## 2024-05-19 DIAGNOSIS — F17210 Nicotine dependence, cigarettes, uncomplicated: Secondary | ICD-10-CM

## 2024-05-19 DIAGNOSIS — G5601 Carpal tunnel syndrome, right upper limb: Secondary | ICD-10-CM

## 2024-05-19 DIAGNOSIS — I10 Essential (primary) hypertension: Secondary | ICD-10-CM

## 2024-05-19 DIAGNOSIS — M65331 Trigger finger, right middle finger: Secondary | ICD-10-CM | POA: Diagnosis not present

## 2024-05-19 DIAGNOSIS — K219 Gastro-esophageal reflux disease without esophagitis: Secondary | ICD-10-CM | POA: Diagnosis not present

## 2024-05-19 DIAGNOSIS — G4730 Sleep apnea, unspecified: Secondary | ICD-10-CM | POA: Insufficient documentation

## 2024-05-19 DIAGNOSIS — F418 Other specified anxiety disorders: Secondary | ICD-10-CM | POA: Diagnosis not present

## 2024-05-19 HISTORY — PX: CARPAL TUNNEL RELEASE: SHX101

## 2024-05-19 MED ORDER — ONDANSETRON HCL 4 MG/2ML IJ SOLN
INTRAMUSCULAR | Status: AC
  Filled 2024-05-19: qty 2

## 2024-05-19 MED ORDER — FENTANYL CITRATE (PF) 100 MCG/2ML IJ SOLN
INTRAMUSCULAR | Status: AC
  Filled 2024-05-19: qty 2

## 2024-05-19 MED ORDER — ONDANSETRON HCL 4 MG/2ML IJ SOLN
INTRAMUSCULAR | Status: DC | PRN
  Administered 2024-05-19: 4 mg via INTRAVENOUS

## 2024-05-19 MED ORDER — PROPOFOL 500 MG/50ML IV EMUL
INTRAVENOUS | Status: AC
  Filled 2024-05-19: qty 50

## 2024-05-19 MED ORDER — ACETAMINOPHEN 500 MG PO TABS
1000.0000 mg | ORAL_TABLET | Freq: Once | ORAL | Status: AC
  Administered 2024-05-19: 1000 mg via ORAL

## 2024-05-19 MED ORDER — OXYCODONE HCL 5 MG/5ML PO SOLN: 5.0000 mg | Freq: Once | ORAL | Status: DC | PRN

## 2024-05-19 MED ORDER — MIDAZOLAM HCL 5 MG/5ML IJ SOLN
INTRAMUSCULAR | Status: DC | PRN
  Administered 2024-05-19 (×2): 1 mg via INTRAVENOUS

## 2024-05-19 MED ORDER — CEFAZOLIN SODIUM-DEXTROSE 2-4 GM/100ML-% IV SOLN
2.0000 g | INTRAVENOUS | Status: AC
  Administered 2024-05-19: 2 g via INTRAVENOUS

## 2024-05-19 MED ORDER — BUPIVACAINE HCL (PF) 0.25 % IJ SOLN
INTRAMUSCULAR | Status: DC | PRN
  Administered 2024-05-19: 10 mL

## 2024-05-19 MED ORDER — LIDOCAINE 2% (20 MG/ML) 5 ML SYRINGE
INTRAMUSCULAR | Status: AC
  Filled 2024-05-19: qty 5

## 2024-05-19 MED ORDER — CEFAZOLIN SODIUM-DEXTROSE 2-4 GM/100ML-% IV SOLN
INTRAVENOUS | Status: AC
  Filled 2024-05-19: qty 100

## 2024-05-19 MED ORDER — OXYCODONE HCL 5 MG PO TABS: 5.0000 mg | ORAL_TABLET | Freq: Once | ORAL | Status: DC | PRN

## 2024-05-19 MED ORDER — LACTATED RINGERS IV SOLN: INTRAVENOUS | Status: DC

## 2024-05-19 MED ORDER — FENTANYL CITRATE (PF) 100 MCG/2ML IJ SOLN
INTRAMUSCULAR | Status: DC | PRN
  Administered 2024-05-19 (×4): 25 ug via INTRAVENOUS

## 2024-05-19 MED ORDER — 0.9 % SODIUM CHLORIDE (POUR BTL) OPTIME
TOPICAL | Status: DC | PRN
  Administered 2024-05-19: 1000 mL

## 2024-05-19 MED ORDER — ACETAMINOPHEN 500 MG PO TABS
ORAL_TABLET | ORAL | Status: AC
  Filled 2024-05-19: qty 2

## 2024-05-19 MED ORDER — FENTANYL CITRATE (PF) 100 MCG/2ML IJ SOLN: 25.0000 ug | INTRAMUSCULAR | Status: DC | PRN

## 2024-05-19 MED ORDER — PROPOFOL 500 MG/50ML IV EMUL
INTRAVENOUS | Status: DC | PRN
  Administered 2024-05-19: 50 ug/kg/min via INTRAVENOUS

## 2024-05-19 MED ORDER — MIDAZOLAM HCL 2 MG/2ML IJ SOLN
INTRAMUSCULAR | Status: AC
  Filled 2024-05-19: qty 2

## 2024-05-19 MED ORDER — DROPERIDOL 2.5 MG/ML IJ SOLN: 0.6250 mg | Freq: Once | INTRAMUSCULAR | Status: DC | PRN

## 2024-05-19 NOTE — Anesthesia Postprocedure Evaluation (Signed)
"   Anesthesia Post Note  Patient: [name] [name]  Procedure(s) Performed: CARPAL TUNNEL RELEASE and trigger finger release (Right: Hand)     Patient location during evaluation: PACU Anesthesia Type: MAC Level of consciousness: awake and alert Pain management: pain level controlled Vital Signs Assessment: post-procedure vital signs reviewed and stable Respiratory status: spontaneous breathing, nonlabored ventilation and respiratory function stable Cardiovascular status: blood pressure returned to baseline Postop Assessment: no apparent nausea or vomiting Anesthetic complications: no   No notable events documented.  Last Vitals:  Vitals:   05/19/24 1045 05/19/24 1050  BP: 113/89   Pulse: 73 68  Resp: 14 17  Temp:    SpO2: 97% 96%    Last Pain:  Vitals:   05/19/24 1045  TempSrc:   PainSc: 0-No pain                 Vertell Row      "

## 2024-05-19 NOTE — Transfer of Care (Signed)
 Immediate Anesthesia Transfer of Care Note  Patient: [name] [name]  Procedure(s) Performed: CARPAL TUNNEL RELEASE and trigger finger release (Right: Hand)  Patient Location: PACU  Anesthesia Type:MAC  Level of Consciousness: awake, alert , and oriented  Airway & Oxygen Therapy: Patient Spontanous Breathing and Patient connected to face mask oxygen  Post-op Assessment: Report given to RN and Post -op Vital signs reviewed and stable  Post vital signs: Reviewed and stable  Last Vitals:  Vitals Value Taken Time  BP 113/66 05/19/24 10:33  Temp    Pulse 67 05/19/24 10:34  Resp 11 05/19/24 10:34  SpO2 97 % 05/19/24 10:34  Vitals shown include unfiled device data.  Last Pain:  Vitals:   05/19/24 0853  TempSrc:   PainSc: 0-No pain      Patients Stated Pain Goal: 5 (05/19/24 0846)  Complications: No notable events documented.

## 2024-05-19 NOTE — Anesthesia Procedure Notes (Signed)
 Procedure Name: MAC Date/Time: 05/19/2024 10:00 AM  Performed by: Pam Macario BROCKS, CRNAPre-anesthesia Checklist: Timeout performed, Patient being monitored, Suction available, Emergency Drugs available and Patient identified Patient Re-evaluated:Patient Re-evaluated prior to induction Oxygen Delivery Method: Simple face mask Preoxygenation: Pre-oxygenation with 100% oxygen Induction Type: IV induction Placement Confirmation: CO2 detector and positive ETCO2

## 2024-05-19 NOTE — H&P (Signed)
 " HAND SURGERY   HPI: Patient is a 69 y.o. female who presents with right carpal tunnel syndrome and right middle finger stenosing tenosynovitis that has failed conservative management.  Patient denies any changes to their medical history or new systemic symptoms today.    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   Crohn disease (HCC)    CTS (carpal tunnel syndrome)    Diarrhea MULITIFACTORIAL   IBS, LACTOSE INTOLERANCE, SBBO, BILE-SALT   Elevated liver enzymes 2009: BMI 24 ?Etoh ALT 94, AST 51,  NEG ANA, qIGs& ASMA   MAY 2012 AST 52 ALT 38   Esophageal stricture 2009   GERD (gastroesophageal reflux disease)    Hemorrhoid    Hyperlipemia    Hypertension    Hypothyroidism 12/13/2020   Inflammatory bowel disease 2006 CD   SURGICAL REMISSION   LBP (low back pain)    Mental disorder    Migraine    Osteoporosis    PONV (postoperative nausea and vomiting)    Shortness of breath    exertion and humidity   Shoulder pain    right   Sleep apnea    Stop Bang Cock score of 4   Past Surgical History:  Procedure Laterality Date   BACK SURGERY     BACK SURGERY  07/16/2018   BILATERAL SALPINGOOPHORECTOMY     BIOPSY  12/24/2011   Procedure: BIOPSY;  Surgeon: Margo LITTIE Haddock, MD;  Location: [facility];  Service: Endoscopy;;  Gastric Biopsies   BIOPSY N/A 06/30/2012   Procedure: BIOPSY;  Surgeon: Margo LITTIE Haddock, MD;  Location: [facility];  Service: Endoscopy;  Laterality: N/A;  Gastric and Esophageal Biopsies   BIOPSY  06/23/2023   Procedure: BIOPSY;  Surgeon: Cindie Carlin POUR, DO;  [facility];  Service: Endoscopy;;   CARPAL TUNNEL RELEASE  LEFT   CATARACT EXTRACTION W/PHACO Right 07/30/2016   Procedure: CATARACT EXTRACTION PHACO AND INTRAOCULAR LENS PLACEMENT (IOC);  Surgeon: Oneil Platts, MD;   Location: [facility];  Service: Ophthalmology;  Laterality: Right;  CDE: 5.45   CATARACT EXTRACTION W/PHACO Left 08/13/2016   Procedure: CATARACT EXTRACTION PHACO AND INTRAOCULAR LENS PLACEMENT (IOC);  Surgeon: Oneil Platts, MD;  Location: [facility];  Service: Ophthalmology;  Laterality: Left;  CDE: 5.59   COLON SURGERY     COLONOSCOPY  JAN 2009 DIARRHEA, ABD PAIN, BRBPR   ANASTOMOTIC ULCER(3MM), IH Ak:AZWPHW ULCER, NL COLON bX   COLONOSCOPY WITH PROPOFOL  N/A 04/21/2018   examined portion of the ileum normal, two 3-5 mm polyps, diverticulosis in the rectosigmoid and sigmoid colon, external and internal hemorrhoids, significant looping of the colon.  Pathology with tubular adenomas.  Recommendations to follow high-fiber diet and repeat colonoscopy in 5 to 10 years with peds colonoscope.   COLONOSCOPY WITH PROPOFOL  N/A 06/23/2023   Procedure: COLONOSCOPY WITH PROPOFOL ;  Surgeon: Cindie Carlin POUR, DO;  [facility];  Service: Endoscopy;  Laterality: N/A;  10:00 am, asa 3   DILATION AND CURETTAGE OF UTERUS     ESOPHAGEAL DILATION  12/24/2011   SLF: A stricture was found in the distal esophagus/ Moderate gastritis/ MILD Duodenitis   ESOPHAGEAL DILATION N/A 06/23/2023   Procedure: ESOPHAGEAL DILATION;  Surgeon: Cindie Carlin POUR, DO;  [facility];  Service: Endoscopy;  Laterality: N/A;   ESOPHAGOGASTRODUODENOSCOPY  02/07/09   mild gastritis   ESOPHAGOGASTRODUODENOSCOPY N/A 09/11/2023   Procedure: EGD (ESOPHAGOGASTRODUODENOSCOPY);  Surgeon: Cindie Carlin POUR, DO;  [facility];  Service: Endoscopy;  Laterality: N/A;  8:15 AM, ASA 3   ESOPHAGOGASTRODUODENOSCOPY (EGD) WITH PROPOFOL  N/A 06/30/2012   SLF: 1. No definite stricture appreciated 2. small hiatal hernia 3. Gastritis   ESOPHAGOGASTRODUODENOSCOPY (EGD) WITH PROPOFOL  N/A 02/20/2016   Procedure: ESOPHAGOGASTRODUODENOSCOPY (EGD) WITH PROPOFOL ;  Surgeon: Margo LITTIE Haddock, MD;  [facility];  Service: Endoscopy;   Laterality: N/A;  930   ESOPHAGOGASTRODUODENOSCOPY (EGD) WITH PROPOFOL  N/A 11/24/2018   benign-appearing esophageal peptic stricture s/p dilation, small hiatal hernia, moderate erosive gastritis.    ESOPHAGOGASTRODUODENOSCOPY (EGD) WITH PROPOFOL  N/A 06/23/2023   Procedure: ESOPHAGOGASTRODUODENOSCOPY (EGD) WITH PROPOFOL ;  Surgeon: Cindie Carlin POUR, DO;  [facility];  Service: Endoscopy;  Laterality: N/A;   EYE SURGERY     gum flap Bilateral    HEMICOLECTOMY  RIGHT 2006   HERNIA REPAIR     LUMBAR DISC SURGERY     POLYPECTOMY  04/21/2018   Procedure: POLYPECTOMY;  Surgeon: Haddock Margo LITTIE, MD;  [facility];  Service: Endoscopy;;  (colon)   SAVORY DILATION N/A 06/30/2012   Procedure: SAVORY DILATION;  Surgeon: Margo LITTIE Haddock, MD;  Location: [facility];  Service: Endoscopy;  Laterality: N/A;  12.81mm , 14mm, 15mm   SAVORY DILATION N/A 02/20/2016   Procedure: SAVORY DILATION;  Surgeon: Margo LITTIE Haddock, MD;  [facility];  Service: Endoscopy;  Laterality: N/A;   SAVORY DILATION N/A 11/24/2018   Procedure: SAVORY DILATION;  Surgeon: Haddock Margo LITTIE, MD;  [facility];  Service: Endoscopy;  Laterality: N/A;   UPPER GASTROINTESTINAL ENDOSCOPY  JAN 2009 ABD PAIN   GASTRITIS, ESO RING   UPPER GASTROINTESTINAL ENDOSCOPY  OCT 2010 ABD PAIN, DYSPHAGIA   DIL , GASTRITIS, NL DUODENUM   UPPER GASTROINTESTINAL ENDOSCOPY  OCT 2010 DYSPHAGIA   DIL 17 MM, GASTRITIS, NL DUODENUM   vocal cord surgery  Sep 20, 2010   precancerous areas removed   wisdom tooth extraction Right    pin placement due to broken jaw   Social History   Socioeconomic History   Marital status: Married    Spouse name: Lemond   Number of children: 2   Years of education: GED   Highest education level: Not on file  Occupational History    Comment: disabled  Tobacco Use   Smoking status: Every Day    Current packs/day: 0.50    Average packs/day: 0.5 packs/day for 49.0 years (24.5 ttl  pk-yrs)    Types: Cigarettes    Start date: 1977   Smokeless tobacco: Never  Vaping Use   Vaping status: Never Used  Substance and Sexual Activity   Alcohol use: Not Currently    Comment: wine/beer a few times a week    Drug use: No   Sexual activity: Not Currently    Birth control/protection: Post-menopausal  Other Topics Concern   Not on file  Social History Narrative   Lives with husband   no caffiene   Social Drivers of Health   Tobacco Use: High Risk (05/19/2024)   Patient History    Smoking Tobacco Use: Every Day    Smokeless Tobacco Use: Never    Passive Exposure: Not on file  Financial Resource Strain: Low Risk (12/19/2023)   Overall Financial Resource Strain (CARDIA)  Difficulty of Paying Living Expenses: Not hard at all  Food Insecurity: No Food Insecurity (12/19/2023)   Epic    Worried About Programme Researcher, Broadcasting/film/video in the Last Year: Never true    Ran Out of Food in the Last Year: Never true  Transportation Needs: No Transportation Needs (12/19/2023)   Epic    Lack of Transportation (Medical): No    Lack of Transportation (Non-Medical): No  Physical Activity: Inactive (12/19/2023)   Exercise Vital Sign    Days of Exercise per Week: 0 days    Minutes of Exercise per Session: 0 min  Stress: No Stress Concern Present (12/19/2023)   [employer] of Occupational Health - Occupational Stress Questionnaire    Feeling of Stress: Not at all  Social Connections: Moderately Isolated (12/19/2023)   Social Connection and Isolation Panel    Frequency of Communication with Friends and Family: Three times a week    Frequency of Social Gatherings with Friends and Family: Once a week    Attends Religious Services: Never    Database Administrator or Organizations: No    Attends Banker Meetings: Never    Marital Status: Married  Depression (PHQ2-9): Medium Risk (12/24/2023)   Depression (PHQ2-9)    PHQ-2 Score: 5  Alcohol Screen: Low Risk (12/19/2023)   Alcohol  Screen    Last Alcohol Screening Score (AUDIT): 0  Housing: Low Risk (12/19/2023)   Epic    Unable to Pay for Housing in the Last Year: No    Number of Times Moved in the Last Year: 0    Homeless in the Last Year: No  Utilities: Not At Risk (12/19/2023)   Epic    Threatened with loss of utilities: No  Health Literacy: Adequate Health Literacy (12/19/2023)   B1300 Health Literacy    Frequency of need for help with medical instructions: Never   Family History  Problem Relation Age of Onset   Hypothyroidism Mother    Hypertension Father    Heart disease Father    Parkinson's disease Father    Crohn's disease Sister    Other Sister        was murdered   Crohn's disease Maternal Uncle    Heart attack Maternal Grandfather    Alzheimer's disease Paternal Grandmother    Heart attack Paternal Grandfather    Hypothyroidism Daughter    Colon cancer Neg Hx    Colon polyps Neg Hx    - negative except otherwise stated in the family history section Allergies[1] Prior to Admission medications  Medication Sig Start Date End Date Taking? Authorizing Provider  benzonatate  (TESSALON ) 100 MG capsule TAKE 1 CAPSULE BY MOUTH TWICE DAILY AS NEEDED FOR COUGH 05/03/24  Yes Bevely Doffing, FNP  Buprenorphine HCl-Naloxone HCl 4-1 MG FILM Place 1 Film under the tongue 4 (four) times daily. 07/23/23  Yes [provider]  calcium  carbonate (TUMS - DOSED IN MG ELEMENTAL CALCIUM ) 500 MG chewable tablet Chew 1 tablet (200 mg of elemental calcium  total) by mouth 3 (three) times daily with meals. 03/02/19  Yes Lenis Ethelle ORN, MD  cyclobenzaprine  (FLEXERIL ) 5 MG tablet Take 1 tablet by mouth three times daily as needed for muscle spasm 05/03/24  Yes Bevely Doffing, FNP  dexlansoprazole  (DEXILANT ) 60 MG capsule Take 1 capsule (60 mg total) by mouth daily. 05/10/24 05/10/25 Yes Carver, Carlin POUR, DO  folic acid  (FOLVITE ) 1 MG tablet Take 1 tablet (1 mg total) by mouth daily. 05/04/24  Yes Bevely Doffing,  FNP  gabapentin  (NEURONTIN ) 300 MG capsule Take 1 capsule (300 mg total) by mouth 3 (three) times daily. 10/31/23  Yes Pearlean Manus, MD  linaclotide  (LINZESS ) 290 MCG CAPS capsule Take 1 capsule (290 mcg total) by mouth daily before breakfast. 03/12/24  Yes Shirlean Therisa ORN, NP  Multiple Vitamin (MULTIVITAMIN WITH MINERALS) TABS tablet Take 1 tablet by mouth daily. 11/01/23  Yes Pearlean Manus, MD  SYMBICORT  80-4.5 MCG/ACT inhaler Inhale 2 puffs by mouth twice daily 05/03/24  Yes Huenink, Leita, FNP  thiamine  (VITAMIN B-1) 100 MG tablet Take 1 tablet (100 mg total) by mouth daily. 11/01/23  Yes Pearlean Manus, MD  UNABLE TO FIND Med Name: [facility] DHA 400 Omega 3 Fish Oil  Memory and Focus   Yes [provider]  clotrimazole  (CLOTRIMAZOLE  AF) 1 % cream Apply 1 application topically 2 (two) times daily. To affected area for 1-2 weeks 01/04/20   Bari Theodoro FALCON, MD  doxycycline  (VIBRAMYCIN ) 100 MG capsule Take 100 mg by mouth 2 (two) times daily. 01/16/24   [provider]  lidocaine  (LIDODERM ) 5 % Place 1 patch onto the skin daily. 05/19/23   [provider]  magnesium  hydroxide (MILK OF MAGNESIA) 400 MG/5ML suspension Take 30 mLs by mouth daily as needed for mild constipation.    [provider]  methylPREDNISolone  (MEDROL  DOSEPAK) 4 MG TBPK tablet Take 4 mg by mouth as directed. 02/03/24   [provider]  nystatin  (MYCOSTATIN ) 100000 UNIT/ML suspension TAKE 5 MLS BY MOUTH 4 TIMES DAILY AS NEEDED. 09/25/23   Melvenia Manus BRAVO, MD  triamcinolone  (KENALOG ) 0.1 % Apply 1 application topically 2 (two) times daily. 07/28/20   Tobie Suzzane POUR, MD  VENTOLIN  HFA 108 (90 Base) MCG/ACT inhaler INHALE 2 PUFFS BY MOUTH EVERY 6 HOURS AS NEEDED FOR WHEEZING FOR SHORTNESS OF BREATH 05/03/24   Bevely Leita, FNP   No results found. - Positive ROS: All other systems have been reviewed and were otherwise negative with the exception of those mentioned in the  HPI and as above.  Physical Exam: General: No acute distress, resting comfortably Cardiovascular: BUE warm and well perfused, normal rate Respiratory: Normal WOB on RA Skin: Warm and dry Neurologic: Sensation intact distally Psychiatric: Patient is at baseline mood and affect  Right upper Extremity  Positive Tinel's sign at the wrist.  Very tender to palpation over the middle finger A1 pulley with palpable locking and catching with passive range of motion.  She has stiffness with the middle finger but no locking or catching.  She has intact thenar motor function with some weakness.  Sensation intact to light touch in the median, ulnar, and radial nerves regions.  All fingers are pink and well-perfused.   Assessment: 69 year old female with right carpal tunnel syndrome and right middle finger stenosing tenosynovitis that has failed conservative management.  Plan: OR today for right carpal tunnel release and right middle finger A1 pulley release. We again reviewed the risks of surgery which include bleeding, infection, damage to neurovascular structures, persistent symptoms, scar sensitivity, pain, stiffness, delayed wound healing, need for additional surgery.  Informed consent was signed.  All questions were answered.   Bebe Galla, M.D. [facility] 9:31 AM     [1]  Allergies Allergen Reactions   Naproxen Shortness Of Breath    Chest pain   Dicyclomine  Hcl Other (See Comments)    Vision changes   Duloxetine Other (See Comments)    Suicidal    Lovastatin Other (See Comments)  Chest pain   Toradol  [Ketorolac  Tromethamine ] Other (See Comments)    Headache. Non-effective   Tramadol Other (See Comments)    Headache. Non-effective.   Nexium  [Esomeprazole  Magnesium ] Diarrhea    Abdominal pain   "

## 2024-05-19 NOTE — Op Note (Signed)
 "  Date of Surgery: 05/19/2024  INDICATIONS: Patient is a 69 y.o.-year-old female with right carpal tunnel syndrome and middle finger stenosing tenosynovitis that has failed conservative management.  Risks, benefits, and alternatives to surgery were again discussed with the patient in the preoperative area. The patient wishes to proceed with surgery.  Informed consent was signed after our discussion.   PREOPERATIVE DIAGNOSIS:  Right carpal tunnel syndrome Right middle trigger finger  POSTOPERATIVE DIAGNOSIS: Same.  PROCEDURE:  Right carpal tunnel release Right middle finger A1 pulley release   SURGEON: Carlin Galla, M.D.  ASSIST: None  ANESTHESIA:  Local, MAC  IV FLUIDS AND URINE: See anesthesia.  ESTIMATED BLOOD LOSS: 5 mL.  IMPLANTS: * No implants in log *   DRAINS: None  COMPLICATIONS: None noted  DESCRIPTION OF PROCEDURE: The patient was met in the preoperative holding area where the surgical site was marked and the informed consent form was signed.  The patient was then brought back to the operating room and remained on the stretcher.  A hand table was placed adjacent to the operative extremity and locked into place.  A tourniquet was placed on the right forearm.  A formal timeout was performed to confirm that this was the correct patient, surgical side, surgical site, and surgical procedure.  All were present and in agreement. Following formal timeout, a local block was performed using 10 mL of 0.25% plain marcaine .  The right upper extremity was then prepped and draped in the usual and sterile fashion.   Following a second timeout, the limb was exsanguinated and the tourniquet inflated to 250 mmHg.  A longitudinal incision was made in line with the radial border of the ring finger from distal to the wrist flexion crease to the intersection of Kaplan's cardinal line.  The skin and subcutaneous tissue was sharply divided.  The longitudinally running palmar fascia was  incised.  The thenar musculature was bluntly swept off of the transverse carpal ligament.  The ligament was divided from proximal to distal until the fat surrounding the palmar arch was encountered. Retractors were then placed in the proximal aspect of the wound to visualize the distal antebrachial fascia.  The fascia was sharply divided under direct visualization.     I then turned my attention to the middle finger.  A longitudinal incision was made over the A1 pulley.  The skin was incised.  Blunt dissection was used to identify the A1 pulley.  Two Ragnell retractors were placed on the radial and ulnar sides of the pulley to protect the respective neurovascular bundles.  A third Ragnell was placed at the distal aspect of the wound.  The A1 pulley was clearly identified.  Under direct visualization, the pulley was entered sharply using a 15 blade.  Tenotomy scissors were used to complete the pulley release distally to the level of the A2 pulley.  The distal retractor was then placed in the proximal aspect of the wound.  Under direct visualization, the proximal aspect of the A1 pulley was completely released.    The wounds were then irrigated.  The tourniquet was deflated.  Hemostasis was achieved with direct pressure and bipolar electrocautery.  The wounds were then closed with 4-0 vicryl rapide sutures in a horizontal mattress fashion. The hand was then dressed with xeroform, folded kerlix, and an ace wrap.  POSTOPERATIVE PLAN: She will be discharged to home with appropriate pain medication and discharge instructions.  I'll see her back in the office in 10-14 days for her  first postop visit.   Carlin Galla, MD 10:34 AM  "

## 2024-05-19 NOTE — Interval H&P Note (Signed)
 History and Physical Interval Note:  05/19/2024 9:33 AM  [name] [name]  has presented today for surgery, with the diagnosis of Right carpal tunnel syndrome.  The various methods of treatment have been discussed with the patient and family. After consideration of risks, benefits and other options for treatment, the patient has consented to  Procedures: CARPAL TUNNEL RELEASE and trigger finger release (Right) as a surgical intervention.  The patient's history has been reviewed, patient examined, no change in status, stable for surgery.  I have reviewed the patient's chart and labs.  Questions were answered to the patient's satisfaction.     Cullen Lahaie

## 2024-05-19 NOTE — Discharge Instructions (Addendum)
 "  Carlin Galla, M.D. Hand Surgery  POST-OPERATIVE DISCHARGE INSTRUCTIONS   PRESCRIPTIONS: - You may have been given a prescription to be taken as directed for post-operative pain control.  You may also take over the counter ibuprofen/aleve and tylenol  for pain. Take this as directed on the packaging. Do not exceed 3000 mg tylenol /acetaminophen  in 24 hours.  Ibuprofen 600-800 mg (3-4) tablets by mouth every 6 hours as needed for pain.   OR  Aleve 2 tablets by mouth every 12 hours (twice daily) as needed for pain.   AND/OR  Tylenol  1000 mg (2 tablets) every 8 hours as needed for pain.  - Please use your pain medication carefully, as refills are limited and you may not be provided with one.  As stated above, please use over the counter pain medicine - it will also be helpful with decreasing your swelling.    ANESTHESIA: -After your surgery, post-surgical discomfort or pain is likely. This discomfort can last several days to a few weeks. At certain times of the day your discomfort may be more intense.   Did you receive a nerve block?   - A nerve block can provide pain relief for one hour to two days after your surgery. As long as the nerve block is working, you will experience little or no sensation in the area the surgeon operated on.  - As the nerve block wears off, you will begin to experience pain or discomfort. It is very important that you begin taking your prescribed pain medication before the nerve block fully wears off. Treating your pain at the first sign of the block wearing off will ensure your pain is better controlled and more tolerable when full-sensation returns. Do not wait until the pain is intolerable, as the medicine will be less effective. It is better to treat pain in advance than to try and catch up.   General Anesthesia:  If you did not receive a nerve block during your surgery, you will need to start taking your pain medication shortly after your surgery and  should continue to do so as prescribed by your surgeon.     ICE AND ELEVATION: - You may use ice for the first 48-72 hours, but it is not critical.   - Motion of your fingers is very important to decrease the swelling.  - Elevation, as much as possible for the next 48 hours, is critical for decreasing swelling as well as for pain relief. Elevation means when you are seated or lying down, you hand should be at or above your heart. When walking, the hand needs to be at or above the level of your elbow.  - If the bandage gets too tight, it may need to be loosened. Please contact our office and we will instruct you in how to do this.    SURGICAL BANDAGES:  - Keep your dressing and/or splint clean and dry at all times.  You can remove your dressing 7 days from now and change with a dry dressing or Band-Aids as needed thereafter. - You may place a plastic bag over your bandage to shower, but be careful, do not get your bandages wet.  - After the bandages have been removed, it is OK to get the stitches wet in a shower or with hand washing. Do Not soak or submerge the wound yet. Please do not use lotions or creams on the stitches.      HAND THERAPY:  - You may not need any. If  you do, we will begin this at your follow up visit in the clinic.    ACTIVITY AND WORK: - You are encouraged to move any fingers which are not in the bandage.  - Light use of the fingers is allowed to assist the other hand with daily hygiene and eating, but strong gripping or lifting is often uncomfortable and should be avoided.  - You might miss a variable period of time from work and hopefully this issue has been discussed prior to surgery. You may not do any heavy work with your affected hand for about 2 weeks.    [facility] Second Floor, [address] [place], [place] [address] [phone]    **No Tylenol  until after 3 pm  Post Anesthesia Home Care Instructions  Activity: Get plenty of rest for the  remainder of the day. A responsible individual must stay with you for 24 hours following the procedure.  For the next 24 hours, DO NOT: -Drive a car -Advertising copywriter -Drink alcoholic beverages -Take any medication unless instructed by your physician -Make any legal decisions or sign important papers.  Meals: Start with liquid foods such as gelatin or soup. Progress to regular foods as tolerated. Avoid greasy, spicy, heavy foods. If nausea and/or vomiting occur, drink only clear liquids until the nausea and/or vomiting subsides. Call your physician if vomiting continues.  Special Instructions/Symptoms: Your throat may feel dry or sore from the anesthesia or the breathing tube placed in your throat during surgery. If this causes discomfort, gargle with warm salt water . The discomfort should disappear within 24 hours.  If you had a scopolamine patch placed behind your ear for the management of post- operative nausea and/or vomiting:  1. The medication in the patch is effective for 72 hours, after which it should be removed.  Wrap patch in a tissue and discard in the trash. Wash hands thoroughly with soap and water . 2. You may remove the patch earlier than 72 hours if you experience unpleasant side effects which may include dry mouth, dizziness or visual disturbances. 3. Avoid touching the patch. Wash your hands with soap and water  after contact with the patch.    "

## 2024-05-20 ENCOUNTER — Encounter (HOSPITAL_BASED_OUTPATIENT_CLINIC_OR_DEPARTMENT_OTHER): Payer: Self-pay | Admitting: Orthopedic Surgery

## 2024-05-20 ENCOUNTER — Ambulatory Visit: Payer: Self-pay | Admitting: Internal Medicine

## 2024-05-24 ENCOUNTER — Other Ambulatory Visit: Payer: Self-pay

## 2024-05-24 DIAGNOSIS — J410 Simple chronic bronchitis: Secondary | ICD-10-CM

## 2024-05-24 DIAGNOSIS — M542 Cervicalgia: Secondary | ICD-10-CM

## 2024-05-28 ENCOUNTER — Other Ambulatory Visit: Payer: Self-pay | Admitting: Internal Medicine

## 2024-06-07 ENCOUNTER — Other Ambulatory Visit: Payer: Self-pay

## 2024-06-07 DIAGNOSIS — M542 Cervicalgia: Secondary | ICD-10-CM

## 2024-06-07 DIAGNOSIS — J410 Simple chronic bronchitis: Secondary | ICD-10-CM

## 2024-06-10 ENCOUNTER — Encounter (HOSPITAL_COMMUNITY): Payer: Self-pay | Admitting: Occupational Therapy

## 2024-06-10 ENCOUNTER — Ambulatory Visit (HOSPITAL_COMMUNITY): Admitting: Occupational Therapy

## 2024-06-10 ENCOUNTER — Other Ambulatory Visit: Payer: Self-pay

## 2024-06-10 DIAGNOSIS — M25631 Stiffness of right wrist, not elsewhere classified: Secondary | ICD-10-CM

## 2024-06-10 DIAGNOSIS — R29898 Other symptoms and signs involving the musculoskeletal system: Secondary | ICD-10-CM

## 2024-06-10 DIAGNOSIS — M25531 Pain in right wrist: Secondary | ICD-10-CM

## 2024-06-10 NOTE — Therapy (Signed)
 " OUTPATIENT OCCUPATIONAL THERAPY ORTHO EVALUATION  Patient Name: [name] [name] MRN: [id number] DOB:[birth year], 69 y.o., female Today's Date: 06/10/2024    END OF SESSION:  OT End of Session - 06/10/24 1455     Visit Number 1    Number of Visits 16    Date for Recertification  08/09/24   progress note 07/09/24   Authorization Type 1) [employer] Medicare Dual Complete 2) [place] Medicaid    Authorization Time Period no auth required    Progress Note Due on Visit 10    OT Start Time 1414    OT Stop Time 1453    OT Time Calculation (min) 39 min    Activity Tolerance Patient tolerated treatment well    Behavior During Therapy [facility] for tasks assessed/performed          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   Crohn disease (HCC)    CTS (carpal tunnel syndrome)    Diarrhea MULITIFACTORIAL   IBS, LACTOSE INTOLERANCE, SBBO, BILE-SALT   Elevated liver enzymes 2009: BMI 24 ?Etoh ALT 94, AST 51,  NEG ANA, qIGs& ASMA   MAY 2012 AST 52 ALT 38   Esophageal stricture 2009   GERD (gastroesophageal reflux disease)    Hemorrhoid    Hyperlipemia    Hypertension    Hypothyroidism 12/13/2020   Inflammatory bowel disease 2006 CD   SURGICAL REMISSION   LBP (low back pain)    Mental disorder    Migraine    Osteoporosis    PONV (postoperative nausea and vomiting)    Shortness of breath    exertion and humidity   Shoulder pain    right   Sleep apnea    Stop Bang Cock score of 4   Past Surgical History:  Procedure Laterality Date   BACK SURGERY     BACK SURGERY  07/16/2018   BILATERAL SALPINGOOPHORECTOMY     BIOPSY  12/24/2011   Procedure: BIOPSY;  Surgeon: Margo LITTIE Haddock, MD;  [facility]: [facility];  Service: Endoscopy;;  Gastric Biopsies   BIOPSY N/A 06/30/2012   Procedure: BIOPSY;  Surgeon: Margo LITTIE Haddock, MD;   Location: [facility];  Service: Endoscopy;  Laterality: N/A;  Gastric and Esophageal Biopsies   BIOPSY  06/23/2023   Procedure: BIOPSY;  Surgeon: Cindie Carlin POUR, DO;  [facility];  Service: Endoscopy;;   CARPAL TUNNEL RELEASE  LEFT   CARPAL TUNNEL RELEASE Right 05/19/2024   Procedure: CARPAL TUNNEL RELEASE and trigger finger release;  Surgeon: Romona Harari, MD;  Location:  SURGERY CENTER;  Service: Orthopedics;  Laterality: Right;   CATARACT EXTRACTION W/PHACO Right 07/30/2016   Procedure: CATARACT EXTRACTION PHACO AND INTRAOCULAR LENS PLACEMENT (IOC);  Surgeon: Oneil Platts, MD;  Location: [facility];  Service: Ophthalmology;  Laterality: Right;  CDE: 5.45   CATARACT EXTRACTION W/PHACO Left 08/13/2016   Procedure: CATARACT EXTRACTION PHACO AND INTRAOCULAR LENS PLACEMENT (IOC);  Surgeon: Oneil Platts, MD;  Location: [facility];  Service: Ophthalmology;  Laterality: Left;  CDE: 5.59   COLON SURGERY     COLONOSCOPY  JAN 2009 DIARRHEA, ABD PAIN, BRBPR   ANASTOMOTIC ULCER(3MM), IH Ak:AZWPHW ULCER, NL COLON bX   COLONOSCOPY WITH PROPOFOL  N/A 04/21/2018   examined portion of  the ileum normal, two 3-5 mm polyps, diverticulosis in the rectosigmoid and sigmoid colon, external and internal hemorrhoids, significant looping of the colon.  Pathology with tubular adenomas.  Recommendations to follow high-fiber diet and repeat colonoscopy in 5 to 10 years with peds colonoscope.   COLONOSCOPY WITH PROPOFOL  N/A 06/23/2023   Procedure: COLONOSCOPY WITH PROPOFOL ;  Surgeon: Cindie Carlin POUR, DO;  [facility];  Service: Endoscopy;  Laterality: N/A;  10:00 am, asa 3   DILATION AND CURETTAGE OF UTERUS     ESOPHAGEAL DILATION  12/24/2011   SLF: A stricture was found in the distal esophagus/ Moderate gastritis/ MILD Duodenitis   ESOPHAGEAL DILATION N/A 06/23/2023   Procedure: ESOPHAGEAL DILATION;  Surgeon: Cindie Carlin POUR, DO;  [facility];  Service: Endoscopy;  Laterality: N/A;    ESOPHAGOGASTRODUODENOSCOPY  02/07/09   mild gastritis   ESOPHAGOGASTRODUODENOSCOPY N/A 09/11/2023   Procedure: EGD (ESOPHAGOGASTRODUODENOSCOPY);  Surgeon: Cindie Carlin POUR, DO;  [facility];  Service: Endoscopy;  Laterality: N/A;  8:15 AM, ASA 3   ESOPHAGOGASTRODUODENOSCOPY (EGD) WITH PROPOFOL  N/A 06/30/2012   SLF: 1. No definite stricture appreciated 2. small hiatal hernia 3. Gastritis   ESOPHAGOGASTRODUODENOSCOPY (EGD) WITH PROPOFOL  N/A 02/20/2016   Procedure: ESOPHAGOGASTRODUODENOSCOPY (EGD) WITH PROPOFOL ;  Surgeon: Margo LITTIE Haddock, MD;  [facility];  Service: Endoscopy;  Laterality: N/A;  930   ESOPHAGOGASTRODUODENOSCOPY (EGD) WITH PROPOFOL  N/A 11/24/2018   benign-appearing esophageal peptic stricture s/p dilation, small hiatal hernia, moderate erosive gastritis.    ESOPHAGOGASTRODUODENOSCOPY (EGD) WITH PROPOFOL  N/A 06/23/2023   Procedure: ESOPHAGOGASTRODUODENOSCOPY (EGD) WITH PROPOFOL ;  Surgeon: Cindie Carlin POUR, DO;  [facility];  Service: Endoscopy;  Laterality: N/A;   EYE SURGERY     gum flap Bilateral    HEMICOLECTOMY  RIGHT 2006   HERNIA REPAIR     LUMBAR DISC SURGERY     POLYPECTOMY  04/21/2018   Procedure: POLYPECTOMY;  Surgeon: Haddock Margo LITTIE, MD;  [facility];  Service: Endoscopy;;  (colon)   SAVORY DILATION N/A 06/30/2012   Procedure: SAVORY DILATION;  Surgeon: Margo LITTIE Haddock, MD;  Location: [facility];  Service: Endoscopy;  Laterality: N/A;  12.38mm , 14mm, 15mm   SAVORY DILATION N/A 02/20/2016   Procedure: SAVORY DILATION;  Surgeon: Margo LITTIE Haddock, MD;  [facility];  Service: Endoscopy;  Laterality: N/A;   SAVORY DILATION N/A 11/24/2018   Procedure: SAVORY DILATION;  Surgeon: Haddock Margo LITTIE, MD;  [facility];  Service: Endoscopy;  Laterality: N/A;   UPPER GASTROINTESTINAL ENDOSCOPY  JAN 2009 ABD PAIN   GASTRITIS, ESO RING   UPPER GASTROINTESTINAL ENDOSCOPY  OCT 2010 ABD PAIN, DYSPHAGIA   DIL , GASTRITIS, NL  DUODENUM   UPPER GASTROINTESTINAL ENDOSCOPY  OCT 2010 DYSPHAGIA   DIL 17 MM, GASTRITIS, NL DUODENUM   vocal cord surgery  Sep 20, 2010   precancerous areas removed   wisdom tooth extraction Right    pin placement due to broken jaw   Patient Active Problem List   Diagnosis Date Noted   Sensorineural hearing loss, bilateral 05/14/2024   Right-sided tinnitus 05/14/2024   Constipation 12/03/2023   Elevated LFTs 12/03/2023   Hypokalemia 11/13/2023   Hyponatremia 10/27/2023   Cervical strain, acute, initial encounter 10/02/2023   Night sweats 08/15/2023   Ankle edema, bilateral 06/10/2023   Breast cancer screening by mammogram 02/13/2023   Encounter for well adult exam with abnormal findings 02/13/2023   Need for influenza vaccination 02/13/2023  Injury to thumb (nail), right, initial encounter 05/05/2022   Encounter for general adult medical examination with abnormal findings 12/27/2021   Rash 08/14/2021   S/P lumbar fusion 10/19/2019   Seasonal allergies 09/01/2018   Chronic low back pain 04/07/2018   Perianal cyst 02/24/2018   Alcohol abuse 12/23/2017   Current smoker 06/25/2017   Osteoarthritis 12/27/2014   Allergic dermatitis eyelid 12/27/2014   Abnormal CT scan, chest 01/12/2014   Polycythemia 09/08/2012   Vertigo 01/22/2012   Cervicalgia 01/22/2012   Chronic pain syndrome 12/27/2011   Diarrhea, episodic 12/27/2011   Edema of larynx 04/22/2011   Dysphagia 03/14/2011   DEPRESSION 04/24/2010   Chronic bronchitis (HCC) 01/19/2010   Osteoporosis 11/11/2008   Spondylolisthesis, lumbar region 07/28/2008   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/2007   DEGENERATIVE DISC DISEASE, CERVICAL SPINE 06/29/2007   Hyperlipidemia 02/03/2007   VARCIES, SITE NEC 09/30/2006   Anxiety state 02/27/2006   Essential hypertension 02/27/2006   Allergic rhinitis 02/27/2006   GERD 02/27/2006   Crohn's disease of small intestine with complication (HCC) 02/27/2006   PCP: Leita Longs,  FNP  REFERRING PROVIDER: Dr. Bebe Galla  ONSET DATE: 05/19/24  REFERRING DIAG: G56.01 (ICD-10-CM) - Carpal tunnel syndrome of right wrist   THERAPY DIAG:  Pain in right wrist  Other symptoms and signs involving the musculoskeletal system  Stiffness of right wrist, not elsewhere classified  Rationale for Evaluation and Treatment: Rehabilitation  SUBJECTIVE:   SUBJECTIVE STATEMENT: S: He is concerned about the swelling Pt accompanied by: self  PERTINENT HISTORY: Pt is a 69 y/o female s/p right carpal tunnel release on 05/19/24. Pt reports she has been wearing the ace bandage over the stitches.   PRECAUTIONS: Other: [place]  Handbook Protocol  WEIGHT BEARING RESTRICTIONS: No  PAIN:  Are you having pain? Yes: NPRS scale: 5/10 Pain location: right wrist Pain description: sore, aching, sometimes sharp Aggravating factors: movement Relieving factors: resting, taking pain medication  FALLS: Has patient fallen in last 6 months? No  PLOF: Independent  PATIENT GOALS: To have less pain and more mobility   NEXT MD VISIT: 06/22/24  OBJECTIVE:  Note: Objective measures were completed at Evaluation unless otherwise noted.  HAND DOMINANCE: Right  ADLs: Pt is having difficulty with all ADLs, cannot grasp or lift items. Pt tries to have the right hand assist with tasks but is having max difficulty with grooming, bathing, some difficulty with dressing.    FUNCTIONAL OUTCOME MEASURES: Quick Dash: 06/10/24 QUICK DASH  Please rate your ability do the following activities in the last week by selecting the number below the appropriate response.   Activities Rating  Open a tight or new jar.  5 = Unable  Do heavy household chores (e.g., wash walls, floors). 5 = Unable  Carry a shopping bag or briefcase 5 = Unable  Wash your back. 5 = Unable  Use a knife to cut food. 5 = Unable  Recreational activities in which you take some force or impact through your arm, shoulder or hand  (e.g., golf, hammering, tennis, etc.). 5 = Unable  During the past week, to what extent has your arm, shoulder or hand problem interfered with your normal social activities with family, friends, neighbors or groups?  3 = Moderately  During the past week, were you limited in your work or other regular daily activities as a result of your arm, shoulder or hand problem? 4 = Very limited  Rate the severity of the following symptoms in the last week: Arm,  Shoulder, or hand pain. 4 = Severe  Rate the severity of the following symptoms in the last week: Tingling (pins and needles) in your arm, shoulder or hand. 2 = Mild  During the past week, how much difficulty have you had sleeping because of the pain in your arm, shoulder or hand?  1 = No difficulty   (A QuickDASH score may not be calculated if there is greater than 1 missing item.)  Quick Dash Disability/Symptom Score: [(sum of 44 (n) responses/11 (n)]-1 x 25 = 75  Minimally Clinically Important Difference (MCID): 15-20 points  (Franchignoni, F. et al. (2013). Minimally clinically important difference of the disabilities of the arm, shoulder, and hand outcome measures (DASH) and its shortened version (Quick DASH). Journal of Orthopaedic & Sports Physical Therapy, 44(1), 30-39)    UPPER EXTREMITY ROM:       Eval: Assessed gravity eliminated position  Active ROM Right eval  Wrist flexion 25  Wrist extension 12  Wrist ulnar deviation 20  Wrist radial deviation 8  Wrist pronation 90  Wrist supination 90  (Blank rows = not tested)  Active ROM Right eval  Thumb MCP (0-60) 54  Thumb IP (0-80) 12  Thumb Opposition to Small Finger 3.5cm  Index MCP (0-90) 54  Index PIP (0-100) 32  Index DIP (0-70)  14  Long MCP (0-90)  46  Long PIP (0-100)  24  Long DIP (0-70)  6  Ring MCP (0-90)  52  Ring PIP (0-100)  20  Ring DIP (0-70)  8  Little MCP (0-90)  42  Little PIP (0-100)  30  Little DIP (0-70)  26  (Blank rows = not tested)   UPPER  EXTREMITY MMT:       Eval: unable to assess due to pain and precautions  MMT Right eval  Wrist flexion   Wrist extension   Wrist ulnar deviation   Wrist radial deviation   Wrist pronation   Wrist supination   (Blank rows = not tested)   HAND FUNCTION: Unable to assess  COORDINATION: Unable to assess  SENSATION: Subjective tingling intermittently  EDEMA:   Right wrist: 15cm  Left wrist: 15cm Right MCPs: 19.25 cm Left MCPs: 18.25cm Right palm: 19.5cm  Left palm: 18.5cm   COGNITION: Overall cognitive status: Within functional limits for tasks assessed  OBSERVATIONS: Pt with stiffness throughout hand and wrist   TREATMENT DATE:  Eval:  -Fitted with XS edema glove and educated on wear and care -Wrist A/ROM: flexion, extension, ulnar/radial deviation, pronation/supination, 10 reps each                                                                                                                               PATIENT EDUCATION: Education details: wrist A/ROM; wear and care of edema glove Person educated: Patient Education method: Explanation, Demonstration, and Handouts Education comprehension: verbalized understanding and returned demonstration  HOME EXERCISE PROGRAM: Eval: wrist A/ROM; wear and care  of edema glove   GOALS: Goals reviewed with patient? Yes  SHORT TERM GOALS: Target date: 07/09/24  Pt will be provided with and educated on HEP to improve mobility of the right wrist required for ADL completion.   Goal status: INITIAL  2.  Pt will improve right wrist A/ROM by 20 degrees to improve ability to use RUE to assist with grooming tasks.    Goal status: INITIAL  3.  Pt will improve active RUE grasp to 75% to improve ability to grasp and hold a utensil required for self-feeding.   Goal status: INITIAL  4.  Pt will reduce edema by .5cm to improve ability to form a functional grasp and mobilize digits, improving ability to use RUE for dressing  tasks.   Goal status: INITIAL'  5.  Pt will increase grip strength to at least 20# or greater to improve ability to grasp and lift items during ADLs and housekeeping.   Goal status: INITIAL    LONG TERM GOALS: Target date: 08/09/24  Pt will decrease pain in RUE to 3/10 or less to improve ability to use RUE as dominant during ADLs.    Goal status: INITIAL  2.  Pt will increase RUE A/ROM by 30+ degrees to improve ability to use RUE for dressing and bathing tasks.   Goal status: INITIAL  3.  Pt will increase grip strength to at least 40# or greater to improve ability to grasp and lift items during ADLs and housekeeping.   Goal status: INITIAL  4.  Pt will improve fine motor coordination required for tasks such as buttoning, cutting food, etc., by completing 9 hole peg test in under 30.   Goal status: INITIAL    ASSESSMENT:  CLINICAL IMPRESSION: Patient is a 69 y.o. female who was seen today for occupational therapy evaluation s/p right carpal tunnel release on 05/19/24.  Pt presents with edema and stiffness of wrist and digits, high pain. Provided XS edema glove to pt to assist with edema management, pt reporting improvement in pain once donned. Pt presents with decreased functional use of the RUE, limitations in ROM, strength, grip, and coordination. Pt will benefit from skilled OT services to reduce pain and edema, improve functional use of the RUE as dominant.   PERFORMANCE DEFICITS: in functional skills including ADLs, IADLs, coordination, dexterity, sensation, edema, ROM, strength, pain, fascial restrictions, and UE functional use  IMPAIRMENTS: are limiting patient from ADLs, IADLs, rest and sleep, and leisure.   COMORBIDITIES: has no other co-morbidities that affects occupational performance. Patient will benefit from skilled OT to address above impairments and improve overall function.  MODIFICATION OR ASSISTANCE TO COMPLETE EVALUATION: No modification of tasks or assist  necessary to complete an evaluation.  OT OCCUPATIONAL PROFILE AND HISTORY: Problem focused assessment: Including review of records relating to presenting problem.  CLINICAL DECISION MAKING: LOW - limited treatment options, no task modification necessary  REHAB POTENTIAL: Good  EVALUATION COMPLEXITY: Low      PLAN:  OT FREQUENCY: 2x/week  OT DURATION: 8 weeks  PLANNED INTERVENTIONS: 97168 OT Re-evaluation, 97535 self care/ADL training, 02889 therapeutic exercise, 97530 therapeutic activity, 97112 neuromuscular re-education, 97140 manual therapy, 97035 ultrasound, 97018 paraffin, 02985 electrical stimulation unattended, patient/family education, and DME and/or AE instructions  RECOMMENDED OTHER SERVICES: None at this time  CONSULTED AND AGREED WITH PLAN OF CARE: Patient  PLAN FOR NEXT SESSION: Follow up on HEP, initiate A/ROM and P/ROM, manual techniques prn, functional grasp work   [employer], OTR/L  [phone]  06/10/2024, 2:56 PM   "

## 2024-06-10 NOTE — Patient Instructions (Signed)
AROM Exercises: *Complete exercises ___10-15___ times each, ___3____ times per day*  1) Wrist Flexion  Start with wrist at edge of table, palm facing up. With wrist hanging slightly off table, curl wrist upward, and back down.      2) Wrist Extension  Start with wrist at edge of table, palm facing down. With wrist slightly off the edge of the table, curl wrist up and back down.      3) Radial Deviations  Start with forearm flat against a table, wrist hanging slightly off the edge, and palm facing the wall. Bending at the wrist only, and keeping palm facing the wall, bend wrist so fist is pointing towards the floor, back up to start position, and up towards the ceiling. Return to start.        4) WRIST PRONATION  Turn your forearm towards palm face down.  Keep your elbow bent and by the side of your  Body.      5) WRIST SUPINATION  Turn your forearm towards palm face up.  Keep your elbow bent and by the side of your  Body.                    

## 2024-06-14 ENCOUNTER — Ambulatory Visit (HOSPITAL_COMMUNITY): Admitting: Occupational Therapy

## 2024-06-16 ENCOUNTER — Ambulatory Visit (HOSPITAL_COMMUNITY): Admitting: Occupational Therapy

## 2024-06-17 ENCOUNTER — Ambulatory Visit (INDEPENDENT_AMBULATORY_CARE_PROVIDER_SITE_OTHER): Admitting: Audiology

## 2024-06-17 ENCOUNTER — Ambulatory Visit (INDEPENDENT_AMBULATORY_CARE_PROVIDER_SITE_OTHER): Admitting: Otolaryngology

## 2024-06-21 ENCOUNTER — Ambulatory Visit (HOSPITAL_COMMUNITY): Admitting: Occupational Therapy

## 2024-06-24 ENCOUNTER — Ambulatory Visit (HOSPITAL_COMMUNITY): Admitting: Occupational Therapy

## 2024-06-29 ENCOUNTER — Ambulatory Visit (HOSPITAL_COMMUNITY): Admitting: Occupational Therapy

## 2024-07-01 ENCOUNTER — Ambulatory Visit (HOSPITAL_COMMUNITY): Admitting: Occupational Therapy

## 2024-07-02 ENCOUNTER — Ambulatory Visit (HOSPITAL_COMMUNITY): Admitting: Occupational Therapy

## 2024-07-05 ENCOUNTER — Ambulatory Visit (HOSPITAL_COMMUNITY): Admitting: Occupational Therapy

## 2024-07-08 ENCOUNTER — Ambulatory Visit (HOSPITAL_COMMUNITY): Admitting: Occupational Therapy

## 2024-07-12 ENCOUNTER — Ambulatory Visit (HOSPITAL_COMMUNITY): Admitting: Occupational Therapy

## 2024-07-15 ENCOUNTER — Ambulatory Visit (HOSPITAL_COMMUNITY): Admitting: Occupational Therapy

## 2024-07-19 ENCOUNTER — Ambulatory Visit (HOSPITAL_COMMUNITY): Admitting: Occupational Therapy

## 2024-07-22 ENCOUNTER — Ambulatory Visit (HOSPITAL_COMMUNITY): Admitting: Occupational Therapy

## 2024-07-26 ENCOUNTER — Ambulatory Visit (HOSPITAL_COMMUNITY): Admitting: Occupational Therapy

## 2024-07-29 ENCOUNTER — Ambulatory Visit (HOSPITAL_COMMUNITY): Admitting: Occupational Therapy

## 2024-08-02 ENCOUNTER — Ambulatory Visit (HOSPITAL_COMMUNITY): Admitting: Occupational Therapy

## 2024-08-05 ENCOUNTER — Ambulatory Visit (HOSPITAL_COMMUNITY): Admitting: Occupational Therapy

## 2024-08-09 ENCOUNTER — Ambulatory Visit (HOSPITAL_COMMUNITY): Admitting: Occupational Therapy

## 2024-08-12 ENCOUNTER — Ambulatory Visit (HOSPITAL_COMMUNITY): Admitting: Occupational Therapy

## 2024-08-16 ENCOUNTER — Ambulatory Visit (HOSPITAL_COMMUNITY): Admitting: Occupational Therapy

## 2024-08-19 ENCOUNTER — Ambulatory Visit (HOSPITAL_COMMUNITY): Admitting: Occupational Therapy

## 2024-08-23 ENCOUNTER — Ambulatory Visit (HOSPITAL_COMMUNITY): Admitting: Occupational Therapy

## 2024-08-26 ENCOUNTER — Ambulatory Visit (HOSPITAL_COMMUNITY): Admitting: Occupational Therapy

## 2024-08-30 ENCOUNTER — Ambulatory Visit (HOSPITAL_COMMUNITY): Admitting: Occupational Therapy

## 2024-09-02 ENCOUNTER — Ambulatory Visit (HOSPITAL_COMMUNITY): Admitting: Occupational Therapy

## 2024-12-20 ENCOUNTER — Ambulatory Visit
